# Patient Record
Sex: Male | Born: 1950 | Race: Black or African American | Hispanic: No | Marital: Married | Smoking: Former smoker
Health system: Southern US, Community
[De-identification: ages and names within clinical notes are randomized; demographics above are authoritative.]

## PROBLEM LIST (undated history)

## (undated) DIAGNOSIS — K2270 Barrett's esophagus without dysplasia: Secondary | ICD-10-CM

## (undated) DIAGNOSIS — R739 Hyperglycemia, unspecified: Secondary | ICD-10-CM

## (undated) DIAGNOSIS — N281 Cyst of kidney, acquired: Secondary | ICD-10-CM

## (undated) DIAGNOSIS — F32A Depression, unspecified: Secondary | ICD-10-CM

## (undated) DIAGNOSIS — K3184 Gastroparesis: Secondary | ICD-10-CM

## (undated) DIAGNOSIS — I10 Essential (primary) hypertension: Secondary | ICD-10-CM

## (undated) DIAGNOSIS — R51 Headache: Secondary | ICD-10-CM

## (undated) DIAGNOSIS — I639 Cerebral infarction, unspecified: Secondary | ICD-10-CM

## (undated) DIAGNOSIS — G8929 Other chronic pain: Secondary | ICD-10-CM

## (undated) DIAGNOSIS — J189 Pneumonia, unspecified organism: Secondary | ICD-10-CM

## (undated) DIAGNOSIS — K222 Esophageal obstruction: Secondary | ICD-10-CM

## (undated) DIAGNOSIS — R519 Headache, unspecified: Secondary | ICD-10-CM

## (undated) DIAGNOSIS — H539 Unspecified visual disturbance: Secondary | ICD-10-CM

## (undated) DIAGNOSIS — K2970 Gastritis, unspecified, without bleeding: Secondary | ICD-10-CM

## (undated) DIAGNOSIS — M549 Dorsalgia, unspecified: Secondary | ICD-10-CM

## (undated) DIAGNOSIS — K219 Gastro-esophageal reflux disease without esophagitis: Secondary | ICD-10-CM

## (undated) DIAGNOSIS — Z87442 Personal history of urinary calculi: Secondary | ICD-10-CM

## (undated) DIAGNOSIS — G4730 Sleep apnea, unspecified: Secondary | ICD-10-CM

## (undated) DIAGNOSIS — N200 Calculus of kidney: Secondary | ICD-10-CM

## (undated) HISTORY — DX: Dorsalgia, unspecified: M54.9

## (undated) HISTORY — DX: Esophageal obstruction: K22.2

## (undated) HISTORY — DX: Gastro-esophageal reflux disease without esophagitis: K21.9

## (undated) HISTORY — PX: COLONOSCOPY: SHX174

## (undated) HISTORY — DX: Sleep apnea, unspecified: G47.30

## (undated) HISTORY — PX: LUMBAR DISC SURGERY: SHX700

## (undated) HISTORY — DX: Cerebral infarction, unspecified: I63.9

## (undated) HISTORY — DX: Headache: R51

## (undated) HISTORY — DX: Gastritis, unspecified, without bleeding: K29.70

## (undated) HISTORY — DX: Calculus of kidney: N20.0

## (undated) HISTORY — DX: Headache, unspecified: R51.9

## (undated) HISTORY — PX: UPPER GASTROINTESTINAL ENDOSCOPY: SHX188

## (undated) HISTORY — DX: Gastroparesis: K31.84

## (undated) HISTORY — DX: Hyperglycemia, unspecified: R73.9

## (undated) HISTORY — DX: Barrett's esophagus without dysplasia: K22.70

## (undated) HISTORY — DX: Other chronic pain: G89.29

## (undated) HISTORY — DX: Unspecified visual disturbance: H53.9

## (undated) HISTORY — DX: Depression, unspecified: F32.A

## (undated) HISTORY — DX: Cyst of kidney, acquired: N28.1

## (undated) HISTORY — DX: Essential (primary) hypertension: I10

---

## 1999-06-21 ENCOUNTER — Encounter: Payer: Self-pay | Admitting: Emergency Medicine

## 1999-06-21 ENCOUNTER — Inpatient Hospital Stay (HOSPITAL_COMMUNITY): Admission: EM | Admit: 1999-06-21 | Discharge: 1999-06-23 | Payer: Self-pay | Admitting: Emergency Medicine

## 1999-06-22 ENCOUNTER — Encounter: Payer: Self-pay | Admitting: Internal Medicine

## 1999-06-23 ENCOUNTER — Encounter: Payer: Self-pay | Admitting: Internal Medicine

## 2000-09-26 ENCOUNTER — Encounter: Payer: Self-pay | Admitting: Neurosurgery

## 2000-09-26 ENCOUNTER — Ambulatory Visit (HOSPITAL_COMMUNITY): Admission: RE | Admit: 2000-09-26 | Discharge: 2000-09-26 | Payer: Self-pay | Admitting: Neurosurgery

## 2000-10-10 ENCOUNTER — Ambulatory Visit (HOSPITAL_COMMUNITY): Admission: RE | Admit: 2000-10-10 | Discharge: 2000-10-10 | Payer: Self-pay | Admitting: Neurosurgery

## 2000-10-10 ENCOUNTER — Encounter: Payer: Self-pay | Admitting: Neurosurgery

## 2002-09-05 ENCOUNTER — Inpatient Hospital Stay (HOSPITAL_COMMUNITY): Admission: EM | Admit: 2002-09-05 | Discharge: 2002-09-06 | Payer: Self-pay | Admitting: Emergency Medicine

## 2002-09-05 ENCOUNTER — Encounter: Payer: Self-pay | Admitting: Emergency Medicine

## 2003-09-04 ENCOUNTER — Ambulatory Visit (HOSPITAL_COMMUNITY): Admission: RE | Admit: 2003-09-04 | Discharge: 2003-09-04 | Payer: Self-pay | Admitting: Gastroenterology

## 2005-10-01 ENCOUNTER — Ambulatory Visit: Payer: Self-pay | Admitting: Gastroenterology

## 2005-10-13 ENCOUNTER — Encounter (INDEPENDENT_AMBULATORY_CARE_PROVIDER_SITE_OTHER): Payer: Self-pay | Admitting: Specialist

## 2005-10-13 ENCOUNTER — Ambulatory Visit: Payer: Self-pay | Admitting: Gastroenterology

## 2005-11-03 ENCOUNTER — Ambulatory Visit: Payer: Self-pay | Admitting: Gastroenterology

## 2006-01-07 ENCOUNTER — Encounter
Admission: RE | Admit: 2006-01-07 | Discharge: 2006-01-07 | Payer: Self-pay | Admitting: Physical Medicine and Rehabilitation

## 2006-11-01 ENCOUNTER — Ambulatory Visit: Payer: Self-pay | Admitting: Internal Medicine

## 2006-11-01 LAB — CONVERTED CEMR LAB
Albumin: 4 g/dL (ref 3.5–5.2)
BUN: 6 mg/dL (ref 6–23)
Basophils Absolute: 0.1 10*3/uL (ref 0.0–0.1)
CO2: 26 meq/L (ref 19–32)
Calcium: 9.1 mg/dL (ref 8.4–10.5)
Chol/HDL Ratio, serum: 5
Cholesterol: 155 mg/dL (ref 0–200)
Creatinine, Ser: 1.2 mg/dL (ref 0.4–1.5)
Hemoglobin: 15.5 g/dL (ref 13.0–17.0)
Ketones, ur: NEGATIVE mg/dL
LDL Cholesterol: 100 mg/dL — ABNORMAL HIGH (ref 0–99)
Leukocytes, UA: NEGATIVE
Lymphocytes Relative: 24.1 % (ref 12.0–46.0)
MCHC: 34.1 g/dL (ref 30.0–36.0)
MCV: 95.3 fL (ref 78.0–100.0)
Monocytes Relative: 5.5 % (ref 3.0–11.0)
Mucus, UA: NEGATIVE
Neutro Abs: 6.9 10*3/uL (ref 1.4–7.7)
Neutrophils Relative %: 68.5 % (ref 43.0–77.0)
TSH: 1.05 microintl units/mL (ref 0.35–5.50)
Urine Glucose: NEGATIVE mg/dL
Urobilinogen, UA: 0.2 (ref 0.0–1.0)
VLDL: 24 mg/dL (ref 0–40)
WBC number, urine, microscopy: NONE SEEN /hpf

## 2006-11-03 ENCOUNTER — Ambulatory Visit: Payer: Self-pay | Admitting: Internal Medicine

## 2007-01-02 ENCOUNTER — Ambulatory Visit: Payer: Self-pay | Admitting: Gastroenterology

## 2007-06-21 ENCOUNTER — Encounter: Admission: RE | Admit: 2007-06-21 | Discharge: 2007-06-21 | Payer: Self-pay | Admitting: Internal Medicine

## 2007-06-21 ENCOUNTER — Ambulatory Visit: Payer: Self-pay | Admitting: Internal Medicine

## 2007-06-21 LAB — CONVERTED CEMR LAB
Amylase: 91 units/L (ref 27–131)
BUN: 11 mg/dL (ref 6–23)
Bilirubin Urine: NEGATIVE
Bilirubin, Direct: 0.1 mg/dL (ref 0.0–0.3)
CO2: 28 meq/L (ref 19–32)
Creatinine, Ser: 1.1 mg/dL (ref 0.4–1.5)
Eosinophils Absolute: 0.1 10*3/uL (ref 0.0–0.6)
Eosinophils Relative: 0.9 % (ref 0.0–5.0)
GFR calc Af Amer: 90 mL/min
Glucose, Bld: 93 mg/dL (ref 70–99)
Ketones, ur: NEGATIVE mg/dL
Leukocytes, UA: NEGATIVE
Lipase: 23 units/L (ref 11.0–59.0)
Lymphocytes Relative: 27.7 % (ref 12.0–46.0)
MCV: 91.2 fL (ref 78.0–100.0)
Monocytes Absolute: 0.4 10*3/uL (ref 0.2–0.7)
Monocytes Relative: 4 % (ref 3.0–11.0)
Mucus, UA: NEGATIVE
Neutro Abs: 5.9 10*3/uL (ref 1.4–7.7)
Nitrite: NEGATIVE
Platelets: 218 10*3/uL (ref 150–400)
Potassium: 4.2 meq/L (ref 3.5–5.1)
Sodium: 140 meq/L (ref 135–145)
Total Bilirubin: 0.8 mg/dL (ref 0.3–1.2)
Total Protein: 7.8 g/dL (ref 6.0–8.3)
Urine Glucose: NEGATIVE mg/dL
Urobilinogen, UA: 0.2 (ref 0.0–1.0)

## 2007-06-23 ENCOUNTER — Encounter: Admission: RE | Admit: 2007-06-23 | Discharge: 2007-06-23 | Payer: Self-pay | Admitting: Internal Medicine

## 2007-07-04 ENCOUNTER — Ambulatory Visit: Payer: Self-pay | Admitting: Internal Medicine

## 2008-01-10 ENCOUNTER — Telehealth: Payer: Self-pay | Admitting: Internal Medicine

## 2008-01-10 DIAGNOSIS — J984 Other disorders of lung: Secondary | ICD-10-CM | POA: Insufficient documentation

## 2008-08-08 ENCOUNTER — Ambulatory Visit: Payer: Self-pay | Admitting: Internal Medicine

## 2008-08-08 DIAGNOSIS — F17200 Nicotine dependence, unspecified, uncomplicated: Secondary | ICD-10-CM | POA: Insufficient documentation

## 2008-08-08 DIAGNOSIS — R112 Nausea with vomiting, unspecified: Secondary | ICD-10-CM | POA: Insufficient documentation

## 2008-08-08 DIAGNOSIS — Z87448 Personal history of other diseases of urinary system: Secondary | ICD-10-CM | POA: Insufficient documentation

## 2008-08-09 ENCOUNTER — Telehealth: Payer: Self-pay | Admitting: Gastroenterology

## 2008-08-14 ENCOUNTER — Ambulatory Visit (HOSPITAL_BASED_OUTPATIENT_CLINIC_OR_DEPARTMENT_OTHER): Admission: RE | Admit: 2008-08-14 | Discharge: 2008-08-14 | Payer: Self-pay | Admitting: Internal Medicine

## 2008-08-14 ENCOUNTER — Telehealth: Payer: Self-pay | Admitting: Internal Medicine

## 2008-08-14 DIAGNOSIS — N281 Cyst of kidney, acquired: Secondary | ICD-10-CM | POA: Insufficient documentation

## 2008-08-15 ENCOUNTER — Telehealth: Payer: Self-pay | Admitting: Internal Medicine

## 2008-08-15 DIAGNOSIS — K209 Esophagitis, unspecified without bleeding: Secondary | ICD-10-CM | POA: Insufficient documentation

## 2008-08-15 DIAGNOSIS — K2900 Acute gastritis without bleeding: Secondary | ICD-10-CM | POA: Insufficient documentation

## 2008-08-15 DIAGNOSIS — K222 Esophageal obstruction: Secondary | ICD-10-CM | POA: Insufficient documentation

## 2008-08-16 ENCOUNTER — Ambulatory Visit: Payer: Self-pay | Admitting: Gastroenterology

## 2008-08-20 ENCOUNTER — Encounter: Payer: Self-pay | Admitting: Gastroenterology

## 2008-08-20 ENCOUNTER — Ambulatory Visit: Payer: Self-pay | Admitting: Gastroenterology

## 2008-08-20 ENCOUNTER — Encounter: Payer: Self-pay | Admitting: Internal Medicine

## 2008-08-26 ENCOUNTER — Encounter: Payer: Self-pay | Admitting: Gastroenterology

## 2008-10-03 ENCOUNTER — Ambulatory Visit: Payer: Self-pay | Admitting: Internal Medicine

## 2008-10-03 DIAGNOSIS — R0789 Other chest pain: Secondary | ICD-10-CM | POA: Insufficient documentation

## 2008-10-04 ENCOUNTER — Telehealth: Payer: Self-pay | Admitting: Internal Medicine

## 2008-10-04 ENCOUNTER — Ambulatory Visit: Payer: Self-pay | Admitting: Internal Medicine

## 2008-10-04 LAB — CONVERTED CEMR LAB
ALT: 29 units/L (ref 0–53)
AST: 22 units/L (ref 0–37)
Albumin: 3.9 g/dL (ref 3.5–5.2)
Alkaline Phosphatase: 74 units/L (ref 39–117)
BUN: 10 mg/dL (ref 6–23)
Bacteria, UA: NEGATIVE
Basophils Relative: 0.4 % (ref 0.0–3.0)
Chloride: 106 meq/L (ref 96–112)
Cholesterol: 158 mg/dL (ref 0–200)
Creatinine, Ser: 1.1 mg/dL (ref 0.4–1.5)
Eosinophils Absolute: 0.1 10*3/uL (ref 0.0–0.7)
Eosinophils Relative: 1 % (ref 0.0–5.0)
Glucose, Bld: 101 mg/dL — ABNORMAL HIGH (ref 70–99)
HCT: 44.2 % (ref 39.0–52.0)
MCV: 95.5 fL (ref 78.0–100.0)
Monocytes Absolute: 0.8 10*3/uL (ref 0.1–1.0)
Monocytes Relative: 8.2 % (ref 3.0–12.0)
Neutrophils Relative %: 66.1 % (ref 43.0–77.0)
Nitrite: NEGATIVE
PSA: 0.33 ng/mL (ref 0.10–4.00)
Platelets: 188 10*3/uL (ref 150–400)
Potassium: 4.3 meq/L (ref 3.5–5.1)
RBC: 4.63 M/uL (ref 4.22–5.81)
Specific Gravity, Urine: 1.025 (ref 1.000–1.03)
Total Protein, Urine: NEGATIVE mg/dL
Total Protein: 6.9 g/dL (ref 6.0–8.3)
Urine Glucose: NEGATIVE mg/dL
WBC: 10.1 10*3/uL (ref 4.5–10.5)

## 2008-10-08 ENCOUNTER — Ambulatory Visit: Payer: Self-pay | Admitting: Internal Medicine

## 2008-10-08 DIAGNOSIS — K219 Gastro-esophageal reflux disease without esophagitis: Secondary | ICD-10-CM | POA: Insufficient documentation

## 2008-10-15 ENCOUNTER — Telehealth: Payer: Self-pay | Admitting: Internal Medicine

## 2008-10-15 ENCOUNTER — Encounter: Admission: RE | Admit: 2008-10-15 | Discharge: 2008-10-15 | Payer: Self-pay | Admitting: Internal Medicine

## 2008-10-16 ENCOUNTER — Ambulatory Visit: Payer: Self-pay | Admitting: Internal Medicine

## 2008-10-16 ENCOUNTER — Ambulatory Visit: Payer: Self-pay | Admitting: Cardiology

## 2008-10-16 ENCOUNTER — Encounter: Payer: Self-pay | Admitting: Internal Medicine

## 2008-10-16 ENCOUNTER — Inpatient Hospital Stay (HOSPITAL_COMMUNITY): Admission: AD | Admit: 2008-10-16 | Discharge: 2008-10-20 | Payer: Self-pay | Admitting: Internal Medicine

## 2008-10-17 ENCOUNTER — Ambulatory Visit: Payer: Self-pay | Admitting: Internal Medicine

## 2008-10-21 ENCOUNTER — Telehealth: Payer: Self-pay | Admitting: Internal Medicine

## 2008-10-21 DIAGNOSIS — IMO0002 Reserved for concepts with insufficient information to code with codable children: Secondary | ICD-10-CM | POA: Insufficient documentation

## 2008-11-11 ENCOUNTER — Ambulatory Visit (HOSPITAL_COMMUNITY): Admission: RE | Admit: 2008-11-11 | Discharge: 2008-11-12 | Payer: Self-pay | Admitting: Neurosurgery

## 2008-11-13 ENCOUNTER — Telehealth (INDEPENDENT_AMBULATORY_CARE_PROVIDER_SITE_OTHER): Payer: Self-pay | Admitting: *Deleted

## 2009-02-05 ENCOUNTER — Encounter: Payer: Self-pay | Admitting: Gastroenterology

## 2009-02-05 ENCOUNTER — Telehealth: Payer: Self-pay | Admitting: Gastroenterology

## 2009-02-06 ENCOUNTER — Telehealth: Payer: Self-pay | Admitting: Gastroenterology

## 2009-02-19 ENCOUNTER — Ambulatory Visit (HOSPITAL_COMMUNITY): Admission: RE | Admit: 2009-02-19 | Discharge: 2009-02-19 | Payer: Self-pay | Admitting: Gastroenterology

## 2009-02-20 ENCOUNTER — Ambulatory Visit: Payer: Self-pay | Admitting: Gastroenterology

## 2009-02-20 DIAGNOSIS — K3184 Gastroparesis: Secondary | ICD-10-CM | POA: Insufficient documentation

## 2009-02-20 DIAGNOSIS — K2270 Barrett's esophagus without dysplasia: Secondary | ICD-10-CM | POA: Insufficient documentation

## 2009-04-21 ENCOUNTER — Telehealth: Payer: Self-pay | Admitting: Family Medicine

## 2009-04-21 ENCOUNTER — Ambulatory Visit: Payer: Self-pay | Admitting: Internal Medicine

## 2009-04-21 LAB — CONVERTED CEMR LAB
Basophils Absolute: 0.1 10*3/uL (ref 0.0–0.1)
HCT: 46 % (ref 39.0–52.0)
Hemoglobin: 15.9 g/dL (ref 13.0–17.0)
Lymphs Abs: 2.3 10*3/uL (ref 0.7–4.0)
MCHC: 34.6 g/dL (ref 30.0–36.0)
MCV: 94.2 fL (ref 78.0–100.0)
Monocytes Absolute: 0.8 10*3/uL (ref 0.1–1.0)
Monocytes Relative: 8.1 % (ref 3.0–12.0)
Neutro Abs: 6.7 10*3/uL (ref 1.4–7.7)
PSA: 0.42 ng/mL (ref 0.10–4.00)
Platelets: 182 10*3/uL (ref 150.0–400.0)
RDW: 12.3 % (ref 11.5–14.6)

## 2009-04-24 ENCOUNTER — Emergency Department (HOSPITAL_COMMUNITY): Admission: EM | Admit: 2009-04-24 | Discharge: 2009-04-24 | Payer: Self-pay | Admitting: Emergency Medicine

## 2009-04-24 ENCOUNTER — Telehealth: Payer: Self-pay | Admitting: Internal Medicine

## 2009-04-25 ENCOUNTER — Ambulatory Visit (HOSPITAL_COMMUNITY): Admission: AD | Admit: 2009-04-25 | Discharge: 2009-04-25 | Payer: Self-pay | Admitting: Dermatology

## 2009-05-23 ENCOUNTER — Ambulatory Visit: Payer: Self-pay | Admitting: Internal Medicine

## 2009-05-23 DIAGNOSIS — N200 Calculus of kidney: Secondary | ICD-10-CM | POA: Insufficient documentation

## 2009-07-02 ENCOUNTER — Ambulatory Visit: Payer: Self-pay | Admitting: Physical Medicine and Rehabilitation

## 2009-07-03 ENCOUNTER — Encounter: Payer: Self-pay | Admitting: Internal Medicine

## 2009-08-15 ENCOUNTER — Telehealth: Payer: Self-pay | Admitting: Gastroenterology

## 2009-08-18 ENCOUNTER — Encounter: Payer: Self-pay | Admitting: Gastroenterology

## 2009-11-03 ENCOUNTER — Telehealth: Payer: Self-pay | Admitting: Internal Medicine

## 2009-11-25 ENCOUNTER — Encounter (INDEPENDENT_AMBULATORY_CARE_PROVIDER_SITE_OTHER): Payer: Self-pay | Admitting: *Deleted

## 2009-12-01 ENCOUNTER — Ambulatory Visit: Payer: Self-pay | Admitting: Gastroenterology

## 2009-12-09 ENCOUNTER — Ambulatory Visit: Payer: Self-pay | Admitting: Gastroenterology

## 2009-12-11 ENCOUNTER — Encounter: Payer: Self-pay | Admitting: Gastroenterology

## 2010-09-09 ENCOUNTER — Emergency Department (HOSPITAL_COMMUNITY): Admission: EM | Admit: 2010-09-09 | Discharge: 2010-09-09 | Payer: Self-pay | Admitting: Emergency Medicine

## 2010-10-02 ENCOUNTER — Encounter: Payer: Self-pay | Admitting: Internal Medicine

## 2010-10-02 ENCOUNTER — Ambulatory Visit: Payer: Self-pay | Admitting: Internal Medicine

## 2010-10-02 DIAGNOSIS — R7309 Other abnormal glucose: Secondary | ICD-10-CM | POA: Insufficient documentation

## 2010-11-13 ENCOUNTER — Telehealth: Payer: Self-pay | Admitting: Gastroenterology

## 2010-11-15 HISTORY — PX: CERVICAL DISC SURGERY: SHX588

## 2010-12-06 ENCOUNTER — Encounter: Payer: Self-pay | Admitting: Internal Medicine

## 2010-12-15 NOTE — Procedures (Signed)
Summary: Upper Endoscopy  Patient: Richard Vaughn Note: All result statuses are Final unless otherwise noted.  Tests: (1) Upper Endoscopy (EGD)   EGD Upper Endoscopy       DONE     [facility]     [address]     [place], [place]  [address]           ENDOSCOPY PROCEDURE REPORT           PATIENT:  Richard Vaughn, Richard Vaughn  MR#:  [id number]     BIRTHDATE:  [birth year], 58 yrs. old  GENDER:  male           ENDOSCOPIST:  Barbette Hair. Arlyce Dice, MD     Referred by:           PROCEDURE DATE:  12/09/2009     PROCEDURE:  EGD with biopsy     ASA CLASS:  Class II     INDICATIONS:  h/o Barrett's Esophagus           MEDICATIONS:   Fentanyl 75 mcg IV, Versed 7 mg IV, glycopyrrolate     (Robinal) 0.2 mg IV, 0.6cc simethancone 0.6 cc PO     TOPICAL ANESTHETIC:  Exactacain Spray           DESCRIPTION OF PROCEDURE:   After the risks benefits and     alternatives of the procedure were thoroughly explained, informed     consent was obtained.  The LB GIF-H180 [id number] endoscope was     introduced through the mouth and advanced to the third portion of     the duodenum, without limitations.  The instrument was slowly     withdrawn as the mucosa was fully examined.     <<PROCEDUREIMAGES>>           Barrett's esophagus was found at the gastroesophageal junction.     Short segment Barrett's. Multiple bxs taken (see image1).  Mild     gastritis was found in the fundus (see image2 and image3). Erosive     changes  Otherwise the examination was normal.    Retroflexed     views revealed no abnormalities.    The scope was then withdrawn     from the patient and the procedure completed.           COMPLICATIONS:  None           ENDOSCOPIC IMPRESSION:     1) Barrett's esophagus at the gastroesophageal junction     2) Mild gastritis in the fundus     3) Otherwise normal examination     RECOMMENDATIONS:     1) continue current medications     2) await biopsy results           REPEAT EXAM:   You will receive  a letter from Dr. Arlyce Dice in 1-2     weeks, after reviewing the final pathology, with followup     recommendations.           ______________________________     Barbette Hair Arlyce Dice, MD           CC:  Thomos Lemons, DO           n.     eSIGNED:   Barbette Hair. Kaplan at 12/09/2009 10:01 AM           Edmund Hilda, [id number]  Note: An exclamation mark (!) indicates a result that was not dispersed into the flowsheet. Document Creation Date: 12/09/2009 10:01 AM _______________________________________________________________________  (  1) Order result status: Final Collection or observation date-time: 12/09/2009 09:54 Requested date-time:  Receipt date-time:  Reported date-time:  Referring Physician:   Ordering Physician: Melvia Heaps [phone]) Specimen Source:  Source: Launa Grill Order Number: [id number] Lab site:   Appended Document: Upper Endoscopy     Procedures Next Due Date:    EGD: 11/2012

## 2010-12-15 NOTE — Miscellaneous (Signed)
Summary: [facility] Previsit/prep  Clinical Lists Changes  Observations: Added new observation of NKA: T (12/01/2009 11:00)

## 2010-12-15 NOTE — Assessment & Plan Note (Signed)
Summary: CPX/DT   Vital Signs:  Patient profile:   60 year old male Height:      74 inches Weight:      286 pounds BMI:     36.85 O2 Sat:      98 % on Room air Temp:     98.2 degrees F oral Pulse rate:   80 / minute BP sitting:   120 / 84  (right arm) Cuff size:   large  Vitals Entered By: Payton Spark CMA (October 02, 2010 11:36 AM)  O2 Flow:  Room air CC: CPX   Primary Care Provider:  Dondra Spry DO  CC:  CPX.  History of Present Illness: 60 y/o male with hx of chronic pain for routine cpx  less GI issues since changing to new pain med no recurrence of kidney stone still disabled mild depressive symptoms  still smoking buproprion helped in the past  Preventive Screening-Counseling & Management  Alcohol-Tobacco     Smoking Status: current     Smoking Cessation Counseling: yes     Smoke Cessation Stage: ready     Packs/Day: 0.5  Current Medications (verified): 1)  Nexium 40 Mg  Cpdr (Esomeprazole Magnesium) .Marland Kitchen.. 1 Capsule Each Day 30 Minutes Before Meal 2)  Opana 10 Mg Tabs (Oxymorphone Hcl) .... Take 1 Tab By Mouth Every 6 Hours  Allergies (verified): No Known Drug Allergies  Past History:  Past Medical History: GERD Hx of Atypical Chest Pain - neg stress test 2003 History of chronic LBP     S/P partial diskectomy in 1995 and disc replacement in 12/2003     Poor response to narcotics - but tolerates tramadol Tobacco abuse  Depression/Anxiety    Barrett's esophagus Hx of esophageal stricture S/P dilation 10/09 - Dr Arlyce Dice Delayed gastric empyting   Kidney stones - calcium oxalate  Family History: Liver cancer - Mother Stroke - father  Colon ca - no Prostate ca - no Hx of hypertension in family       Social History: 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    Physical Exam  General:  alert, well-developed, and well-nourished.   Head:  normocephalic and atraumatic.   Ears:  R ear normal and L ear normal.   Mouth:  pharynx pink and moist and poor dentition.   Neck:  supple, no masses, and no carotid bruits.   Lungs:  normal respiratory effort and normal breath sounds.   Heart:  normal rate, regular rhythm, and no gallop.   Abdomen:  soft, non-tender, normal bowel sounds, no masses, no hepatomegaly, and no splenomegaly.   Extremities:  No lower extremity edema  Neurologic:  cranial nerves II-XII intact and gait normal.   Psych:  normally interactive, good eye contact, not anxious appearing, and not depressed appearing.     Impression & Recommendations:  Problem # 1:  PHYSICAL EXAMINATION (ICD-V70.0) Reviewed adult health maintenance protocols. Pt counseled on diet and exercise.  Orders: T-Basic Metabolic Panel [phone]) T-Lipid Profile [id number])  Colonoscopy: normal (08/07/2003) Flu Vax: Declined (08/08/2008)   Pneumovax: Declined (08/08/2008) Chol: 158 (10/04/2008)   HDL: 30.7 (10/04/2008)   LDL: 110 (10/04/2008)   TG: 88 (10/04/2008) TSH: 0.93 (10/04/2008)   PSA: 0.42 (  04/21/2009)  Problem # 2:  TOBACCO ABUSE (ICD-305.1)  Encouraged smoking cessation and discussed different methods for smoking cessation.   Complete Medication List: 1)  Nexium 40 Mg Cpdr (Esomeprazole magnesium) .Marland Kitchen.. 1 capsule each day 30 minutes before meal 2)  Opana 10 Mg Tabs (Oxymorphone hcl) .... Take 1 tab by mouth every 6 hours 3)  Bupropion Hcl 150 Mg Xr24h-tab (Bupropion hcl) .... One by mouth once daily  Other Orders: T- Hemoglobin A1C ([id number]) CRP, high sensitivity-[facility] ([id number])  Patient Instructions: 1)  Please schedule a follow-up appointment in 1 year. Prescriptions: BUPROPION HCL 150 MG XR24H-TAB (BUPROPION HCL) one by  mouth once daily  #30 x 3   Entered and Authorized by:   D. Thomos Lemons DO   Signed by:   D. Thomos Lemons DO on 10/02/2010   Method used:   [facility] to        [facility]  [phone]* (retail)       [address] [place], [place]  [address]       Ph: [phone] or [phone]       Fax: [phone]   RxID:   [id number]    Orders Added: 1)  T-Basic Metabolic Panel [id number] 2)  T-Lipid Profile [[address]-22930] 3)  T- Hemoglobin A1C [[address]-23375] 4)  CRP, high sensitivity-[facility] [[address]] 5)  Est. Patient 40-64 years [[address]]

## 2010-12-15 NOTE — Letter (Signed)
Summary: Patient Notice-Barrett's [facility]  [address] [place], [place] [address]   Phone: [phone]  Fax: [phone]        December 11, 2009 MRN: [id number]    Richard Vaughn [address] [place], [place]  [address]    Dear Mr. [name],  I am pleased to inform you that the biopsies taken during your recent endoscopic examination did not show any evidence of cancer upon pathologic examination.  However, your biopsies indicate you have a condition known as Barrett's esophagus. While not cancer, it is pre-cancerous (can progress to cancer) and needs to be monitored with repeat endoscopic examination and biopsies.  Fortunately, it is quite rare that this develops into cancer, but careful monitoring of the condition along with taking your medication as prescribed is important in reducing the risk of developing cancer.  It is my recommendation that you have a repeat upper gastrointestinal endoscopic examination in 3_ years.  Additional information/recommendations:  __Please call [phone] to schedule a return visit to further      evaluate your condition.  _x_Continue with treatment plan as outlined the day of your exam.  Please call us if you have or develop heartburn, reflux symptoms, any swallowing problems, or if you have questions about your condition that have not been fully answered at this time.  Sincerely,  Louis Meckel MD  This letter has been electronically signed by your physician.  Appended Document: Patient Notice-Barrett's Esopghagus Letter mailed 1.31.11

## 2010-12-15 NOTE — Letter (Signed)
Summary: EGD Instructions  [facility]  [address] [place], [place] [address]   Phone: [phone]  Fax: [phone]       Richard Vaughn    [birth year]    MRN: [id number]       Procedure Day Dorna Bloom: Rica Mote. 01/25     Arrival Time:  8:30am     Procedure Time:  9:30am    Location of Procedure:                    _ X _ [facility] (4th Floor)    PREPARATION FOR ENDOSCOPY   On  Tues. 12/09/09   THE DAY OF THE PROCEDURE:  1.   No solid foods, milk or milk products are allowed after midnight the night before your procedure.  2.   Do not drink anything colored red or purple.  Avoid juices with pulp.  No orange juice.  3.  You may drink clear liquids until 7:30am , which is 2 hours before your procedure.                                                                                                CLEAR LIQUIDS INCLUDE: Water Jello Ice Popsicles Tea (sugar ok, no milk/cream) Powdered fruit flavored drinks Coffee (sugar ok, no milk/cream) Gatorade Juice: apple, white grape, white cranberry  Lemonade Clear bullion, consomm, broth Carbonated beverages (any kind) Strained chicken noodle soup Hard Candy   MEDICATION INSTRUCTIONS  Unless otherwise instructed, you should take regular prescription medications with a small sip of water as early as possible the morning of your procedure.        OTHER INSTRUCTIONS  You will need a responsible adult at least 58 years of age to accompany you and drive you home.   This person must remain in the waiting room during your procedure.  Wear loose fitting clothing that is easily removed.  Leave jewelry and other valuables at home.  However, you may wish to bring a book to read or an iPod/MP3 player to listen to music as you wait for your procedure to start.  Remove all body piercing jewelry and leave at home.  Total time from sign-in until discharge is approximately 2-3 hours.  You should go home directly  after your procedure and rest.  You can resume normal activities the day after your procedure.  The day of your procedure you should not:   Drive   Make legal decisions   Operate machinery   Drink alcohol   Return to work  You will receive specific instructions about eating, activities and medications before you leave.    The above instructions have been reviewed and explained to me by   Wyona Almas RN  December 01, 2009 11:28 AM     I fully understand and can verbalize these instructions _____________________________ Date _________

## 2010-12-17 NOTE — Progress Notes (Signed)
Summary: Med Refill  Medications Added NEXIUM 40 MG  CPDR (ESOMEPRAZOLE MAGNESIUM) 1 capsule each day 30 minutes before meal       Phone Note Call from Patient Call back at Home Phone [phone]   Caller: Patient Call For: Dr Arlyce Dice Reason for Call: Refill Medication Details for Reason: Med Refill Summary of Call: Pt req a 30 refill on his Nexium until his appointment on 12/21/10 Initial call taken by: Dwan Bolt,  November 13, 2010 3:13 PM  Follow-up for Phone Call        Will sent in an extra 30 due to his appointment not until 2/6 Follow-up by: Merri Ray CMA [name]),  November 13, 2010 3:28 PM    New/Updated Medications: NEXIUM 40 MG  CPDR (ESOMEPRAZOLE MAGNESIUM) 1 capsule each day 30 minutes before meal Prescriptions: NEXIUM 40 MG  CPDR (ESOMEPRAZOLE MAGNESIUM) 1 capsule each day 30 minutes before meal  #30 x 0   Entered by:   Merri Ray CMA (AAMA)   Authorized by:   Louis Meckel MD   Signed by:   Merri Ray CMA (AAMA) on 11/13/2010   Method used:   [facility]  [phone]* (retail)       [address] [place], [place]  [address]       Ph: [phone] or [phone]       Fax: [phone]   RxID:   [id number]

## 2010-12-18 ENCOUNTER — Encounter: Payer: Self-pay | Admitting: Internal Medicine

## 2010-12-21 ENCOUNTER — Ambulatory Visit (INDEPENDENT_AMBULATORY_CARE_PROVIDER_SITE_OTHER): Payer: Federal, State, Local not specified - PPO | Admitting: Gastroenterology

## 2010-12-21 ENCOUNTER — Encounter: Payer: Self-pay | Admitting: Gastroenterology

## 2010-12-21 DIAGNOSIS — K2270 Barrett's esophagus without dysplasia: Secondary | ICD-10-CM

## 2010-12-21 DIAGNOSIS — K219 Gastro-esophageal reflux disease without esophagitis: Secondary | ICD-10-CM

## 2010-12-31 NOTE — Assessment & Plan Note (Signed)
Summary: Med Refill     History of Present Illness Visit Type: Follow-up Visit Primary GI MD: Melvia Heaps MD [facility] Primary Provider: Thomos Lemons, DO Requesting Provider: na Chief Complaint: F/u for barrett's esophagus and GERD. Pt states that he is doing good and denies any GI complaints. Pt needs refill on Nexium  History of Present Illness:   Richard Vaughn has returned for followup of his Barrett's esophagus and esophageal stricture.  Last Egd in 2011 was negative for dysplasia.  He denies dysphagia.  He remains on Nexium.   GI Review of Systems      Denies abdominal pain, acid reflux, belching, bloating, chest pain, dysphagia with liquids, dysphagia with solids, heartburn, loss of appetite, nausea, vomiting, vomiting blood, weight loss, and  weight gain.        Denies anal fissure, black tarry stools, change in bowel habit, constipation, diarrhea, diverticulosis, fecal incontinence, heme positive stool, hemorrhoids, irritable bowel syndrome, jaundice, light color stool, liver problems, rectal bleeding, and  rectal pain.    Current Medications (verified): 1)  Nexium 40 Mg  Cpdr (Esomeprazole Magnesium) .Marland Kitchen.. 1 Capsule Each Day 30 Minutes Before Meal 2)  Opana 10 Mg Tabs (Oxymorphone Hcl) .... Take 1 Tab By Mouth Every 6 Hours  Allergies (verified): No Known Drug Allergies  Past History:  Past Medical History: Reviewed history from 10/02/2010 and no changes required. GERD Hx of Atypical Chest Pain - neg stress test 2003 History of chronic LBP     S/P partial diskectomy in 1995 and disc replacement in 12/2003     Poor response to narcotics - but tolerates tramadol Tobacco abuse  Depression/Anxiety    Barrett's esophagus Hx of esophageal stricture S/P dilation 10/09 - Dr Arlyce Dice Delayed gastric empyting   Kidney stones - calcium oxalate  Past Surgical History: Reviewed history from 02/20/2009 and no changes required. T1 T2 Laminectomy  Family History: Reviewed history from  10/02/2010 and no changes required. Liver cancer - Mother Stroke - father  Colon ca - no Prostate ca - no Hx of hypertension in family       Social History: Reviewed history from 10/02/2010 and no changes required. 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  The patient denies allergy/sinus, anemia, anxiety-new, arthritis/joint pain, blood in urine, breast changes/lumps, change in vision, confusion, cough, coughing up blood, depression-new, fainting, fatigue, fever, headaches-new, hearing problems, heart murmur, heart rhythm changes, itching, menstrual pain, muscle pains/cramps, night sweats, nosebleeds, pregnancy symptoms, shortness of breath, skin rash, sleeping problems, sore throat, swelling of feet/legs, swollen lymph glands, thirst - excessive , urination - excessive , urination changes/pain, urine leakage, vision changes, and voice change.         All other systems were reviewed and were negative   Vital Signs:  Patient profile:   60 year old male Height:      74 inches Weight:      296 pounds BMI:     38.14 BSA:     2.57 Pulse rate:   88 / minute Pulse rhythm:   regular BP sitting:   132 / 74  (left arm) Cuff size:   large  Vitals Entered By: Ok Anis CMA (December 21, 2010 8:56 AM)  Physical Exam  Additional Exam:  On physical exam he is a well-developed well-nourished  male  skin: anicteric HEENT: normocephalic; PEERLA; no nasal or pharyngeal abnormalities neck: supple nodes: no cervical lymphadenopathy chest: clear to ausculatation and percussion heart: no murmurs, gallops, or rubs abd: soft, nontender; BS normoactive; no abdominal masses, tenderness, organomegaly rectal: deferred ext: no cynanosis, clubbing, edema skeletal: no deformities neuro: oriented x 3; no focal abnormalities    Impression &  Recommendations:  Problem # 1:  BARRETTS ESOPHAGUS (ICD-530.85) Plan followup endoscopy in 2014  Problem # 2:  GERD (ICD-530.81) Plan to continue Nexium  Problem # 3:  GASTROPARESIS (ICD-536.3) Currently asymptomatic off medications.  Patient Instructions: 1)  Copy sent to : Thomos Lemons, DO 2)  Your Next Endoscopy is due in 2014, We will mail out a reminder for you 3)  Call back as needed  4)  The medication list was reviewed and reconciled.  All changed / newly prescribed medications were explained.  A complete medication list was provided to the patient / caregiver. Prescriptions: NEXIUM 40 MG  CPDR (ESOMEPRAZOLE MAGNESIUM) 1 capsule each day 30 minutes before meal  #30 Capsule x 2   Entered and Authorized by:   Louis Meckel MD   Signed by:   Louis Meckel MD on 12/21/2010   Method used:   [facility] to        [facility]  [phone]* (retail)       [address] [place], [place]  [address]       Ph: [phone] or [phone]       Fax: [phone]   RxID:   [id number]

## 2011-01-01 ENCOUNTER — Encounter: Payer: Self-pay | Admitting: Internal Medicine

## 2011-01-06 NOTE — Letter (Signed)
Summary: [facility]  [facility]   Imported By: Maryln Gottron 12/31/2010 15:35:31  _____________________________________________________________________  External Attachment:    Type:   Image     Comment:   External Document

## 2011-01-21 NOTE — Letter (Signed)
Summary: [facility]  [facility]   Imported By: Maryln Gottron 01/13/2011 14:54:57  _____________________________________________________________________  External Attachment:    Type:   Image     Comment:   External Document

## 2011-02-22 LAB — URINE MICROSCOPIC-ADD ON

## 2011-02-22 LAB — DIFFERENTIAL
Basophils Absolute: 0 10*3/uL (ref 0.0–0.1)
Lymphocytes Relative: 15 % (ref 12–46)
Neutro Abs: 8.8 10*3/uL — ABNORMAL HIGH (ref 1.7–7.7)
Neutrophils Relative %: 77 % (ref 43–77)

## 2011-02-22 LAB — COMPREHENSIVE METABOLIC PANEL
BUN: 8 mg/dL (ref 6–23)
CO2: 22 mEq/L (ref 19–32)
Calcium: 8.2 mg/dL — ABNORMAL LOW (ref 8.4–10.5)
Chloride: 111 mEq/L (ref 96–112)
Creatinine, Ser: 0.99 mg/dL (ref 0.4–1.5)
GFR calc non Af Amer: >60 mL/min (ref >60)
Total Bilirubin: 0.6 mg/dL (ref 0.3–1.2)

## 2011-02-22 LAB — CBC
HCT: 42 % (ref 39.0–52.0)
MCHC: 34.8 g/dL (ref 30.0–36.0)
MCV: 94.1 fL (ref 78.0–100.0)
RBC: 4.47 MIL/uL (ref 4.22–5.81)
WBC: 11.3 10*3/uL — ABNORMAL HIGH (ref 4.0–10.5)

## 2011-02-22 LAB — URINALYSIS, ROUTINE W REFLEX MICROSCOPIC
Glucose, UA: NEGATIVE mg/dL
Specific Gravity, Urine: 1.014 (ref 1.005–1.030)
pH: 5.5 (ref 5.0–8.0)

## 2011-02-22 LAB — URINE CULTURE

## 2011-02-22 LAB — LIPASE, BLOOD: Lipase: 16 U/L (ref 11–59)

## 2011-03-29 ENCOUNTER — Other Ambulatory Visit: Payer: Self-pay | Admitting: Gastroenterology

## 2011-03-30 NOTE — Op Note (Signed)
NAME:  CONNERY, SHIFFLER NO.:  [id number]   MEDICAL RECORD NO.:  [id number]          PATIENT TYPE:  OIB   LOCATION:  3524                         FACILITY:  [facility]   PHYSICIAN:  Reinaldo Meeker, M.D. 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   PROCEDURE:  Left T1-T2 laminotomy with foraminotomy and excision of  herniated disk.   SURGEON:  Reinaldo Meeker, MD   ASSISTANT:  Donalee Citrin, MD   PROCEDURE IN DETAIL:  After placing in the prone position, the patient's  neck and upper chest was prepped and draped in the usual sterile  fashion.  Localizing x-ray was taken prior to incision to identify the  appropriate level.  A midline incision was made over the spinous  processes of the C7, T1, and T2.  Using Bovie cutting current, the  incision was carried down the spinous processes.  Subperiosteal  dissection was then carried out on the left-sided spinous process,  lamina, and facet joint.  Self-retaining retractor was placed for  exposure.  X-rays showed approach to the C6-7 and C7-T1 levels and  dissection was carried out 1 level further to the T1-T2 level.  Self-  retaining retractor was placed for exposure of that level.  Laminotomy  was performed by removing the inferior third of the T1 lamina laterally  and the superior one-third of the T2 lamina.  The facet joint was then  undercut to decompress the T2 nerve root as it went out the foramen.  Thorough decompression was carried out.  The microscope was draped,  brought in the field, and used for the remainder of the case.  Using  microdissection technique, the lateral recess __________ interval was  identified.  Large amounts of herniated disk material were identified  there and this was removed.  Exploration under  the nerve root then  yielded two large fragments of disk, which were also removed to give  excellent decompression of the lateral spinal dura and T2 nerve root.  At that time, inspection was carried out in all directions for any  evidence of residual compression and none could be identified.  Large  amounts of irrigation were carried out.  Any bleeding was controlled  with bipolar coagulation and Gelfoam.  The wound was then closed in  multiple layers of Vicryl in the muscle fascia, subcutaneous and  subcuticular tissues and staples were placed on the skin.  A sterile  dressing was then applied.  The patient was extubated and taken to the  recovery room in stable condition.           ______________________________  Reinaldo Meeker, M.D.     ROK/MEDQ  D:  11/11/2008  T:  11/11/2008  Job:  [id number]

## 2011-03-30 NOTE — Group Therapy Note (Signed)
[name] is a 60 year old married gentleman, who is kindly  referred by Dr. Artist Pais.  [name] has a long history of low back pain,  which dates back to 1995 when he underwent a diskectomy for his L5-S1  disk.  He states that he did well for almost 10 years, then in  approximately 2005, he developed some right leg pain, and then a year  and half later left leg pain.   He underwent disk replacement surgery down in [place].  He more  recently developed a disk herniation at T1-T2 in 2009 and underwent a  laminectomy at that level by Dr. Gerlene Fee.   [name] indicates that he has several areas of pain.  The worst one for  him is in his right lower extremity, which he describes as a very  constant pain, ranging from a 3-7or 8 on a scale of 10.  The pain is  constant throughout the day as well as a night and also makes it  somewhat difficult for him to sleep at times.  It limits his walking to  not more than 15 minutes.  He is able to climb stairs.  He does not need  a cane.   He also gets some left lower extremity pain, which occurs a couple of  times a month.  He states that it is sporadic and it goes from 5-10 on a  scale of 10 at times.   He has some mid thoracic back pain, which he describes as a 4-7 on a  scale of 10 and occurs almost every day, but it is not as  aggravating  as his right leg pain.  He has minimal low back pain at this time.   Pain is typically worse with walking, bending, sitting, inactivity, or  standing, it improves with rest.  He gets fair relief with current meds.   Pain medications at this time include Ultram 1 tablet usually about 7 or  8 p.m. and Percocet 1 tablet up to twice a day.  In the past, he is  taking his pain medications more regularly; however, over the last year,  he has developed nausea, which has gotten much worse to the point that  it interferes with his daily activities.  It is this nausea that is very  frustrating to him as he  cannot take pain medicines because the nausea  gets much worse.   Functional status, he has been disabled since December 24, 2002.  He  denies problems controlling bowel or bladder.  He denies depression,  anxiety, or suicidal ideation.   Review of systems is positive for nausea which is worse when he takes  his pain medications, especially the Percocet as well as the Ultram.  He  states that he has been on these pain medications at least 5 years, but  within the last year that he has had more problems.  He does have a GI  specialist, Dr. Arlyce Dice who also takes care of him.   Past medical history is significant for Barrett esophagus, tobacco use,  depression/anxiety, history of esophageal strictures, status post  dilation in October 2009 by Dr. Arlyce Dice, history of delayed gastric  emptying, and history of kidney stones.   No known drug allergies.   Past surgical history, in 1995; partial diskectomy L5-S1.  In 2005, disk  replacement in [place].  In 2009, T1-T2 laminectomy Dr. Gerlene Fee.   The patient has been married 12 years.  He lives with  his wife.  No  other people are in the home.  He denies illegal substance use.  Denies  alcohol use.  Smokes a pack of cigarettes per day.   Mother deceased at age 66, biliary cancer.  Father deceased at age 25,  stroke and vascular disease.   Other medications he takes include:  1. Nexium 40 mg.  2. Ranitidine 150 mg at bedtime.  3. Ondansetron 4 mg q.6 h. as needed for nausea.  4. Promethazine 25 mg q.6 h. as needed for nausea.   Examination, an MRI was done on October 18, 2008, showed multilevel  cervical degenerative changes at T1-T2 was moderate, large left-sided  disk protrusion, compressing left T2 nerve root with mass effect on the  left cord, status post laminectomy in 2009, Dr. Gerlene Fee, thoracic MRI  done same date showed large right-sided disk herniation with a vertebral  spurring and flattening of the cord on the right and  right foraminal  encroachment.   Apparently, there has been an MRI within the last couple of years done  through [facility] office.  I do not have results from this particular  scan.   Exam, blood pressure is 145/92, pulse 89, respiration 18, 97% saturated  on room air.  [name] is a well-developed, well-nourished gentleman,  who appears in his stated age and does not appear in any distress.  He  is oriented x3.  Speech is clear.  His affect is bright.  He is alert,  cooperative, and pleasant.  Follows commands without difficulty.  Answers my questions appropriately.   Cranial nerves and coordination are intact.  Reflexes are 2+ at the  right biceps; triceps; and brachial radialis, 2+ at the left biceps and  brachioradialis, 1+ at the left triceps, 1+ at the right patellar  tendon, 2+ at the left patellar tendon, 1+ at bilateral Achilles  tendons.  No abnormal tone or clonus or tremors are appreciated.  He has  normal muscle bulk noted in both upper as well as lower extremities  without obvious atrophy.   Motor strength generally is 5/5 without focal deficit in upper and lower  extremities.   Transitioning from sitting to standing is done with ease, heel-toe  walking is accomplished easily, tandem gait is performed adequately,  Romberg test is performed adequately as well.  Limitations noted was  cervical rotation to the left minimally with some discomfort, full  shoulder range of motion appreciated, lumbar motion is limited in all  planes with some complaints of pain in all directions.   Sensory deficit is appreciated in the left medial arm, sensory deficit  also noted over right medial ankle and right medial lower leg as well as  left medial ankle and plantar surface.   IMPRESSION:  1. Multilevel cervical degenerative changes.  2. Multilevel thoracic degenerative changes.  3. Status post disk replacement at L5-S1 in 2005.  4. Status post laminectomy in 2009 at T1-T2 by Dr.  Gerlene Fee.  5. Predominantly right lower extremity pain, constant in nature,      consistent with neuropathic-type pain.  Apparently, there has been      on MRI within the last year and half per Dr. Murray Hodgkins.  This imaging      study is not available to me at this time.   Sporadic left lower extremity pain not is bothersome to the patient at  this time.   Plan, treatment options were discussed today with Richard Vaughn to include  consideration of Neurontin or  Lyrica. He states he may have tried these  in the past.  He is not sure they have helped significantly.  However,  he would like to trial them one more time.  We also discussed  consideration of medial-branch blocks as well.  Would like to obtain the  results of past lumbar imaging studies.  He would prefer to start on  Lyrica, will start 50 mg titrating up to 3 times a day, and we will see  him back in 1 month.  He has trialed multiple epidural steroid  injections in the last several years, last one was about a year ago,  giving him a very little relief at that time.  He is not interested in  further  epidural steroid injections.  We will see him back in a month.  We also  discussed possibly getting him back involved in a therapeutic exercise  program.           ______________________________  Brantley Stage, M.D.     DMK/MedQ  D:  07/02/2009 10:27:37  T:  07/02/2009 22:40:58  Job #:  [id number]   cc:   Barbette Hair. [place], DO  [address] [place], [place] [address]

## 2011-03-30 NOTE — Assessment & Plan Note (Signed)
[facility]                                 ON-CALL NOTE   MAYO, FAULK                       MRN:          [id number]  DATE:06/21/2007                            DOB:          Jan 11, 1951    TIME OF CALL:  6:52 p.m.   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  He sees Dr. Jonny Ruiz.   TELEPHONE NUMBER:  [id number].   Patient was seen by Dr. Jonny Ruiz in the office today with right lower  quadrant abdominal pain.  Dr. Raphael Gibney main concern was appendicitis so he  ordered a CT scan on the abdomen and pelvis.  Results now show a clearly  normal appendix and really no other acute findings.  They did mention  some bibasilar sub-centimeter nodules, also some renal densities and  they suggested getting renal ultrasounds to follow these up, but again  no source for his pain.  He was prescribed antibiotics and pain  medications today in the office.  I told Alice to tell the patient to go  home and follow the instructions given and to contact Dr. Jonny Ruiz at the  office tomorrow.     Tera Mater. Clent Ridges, MD  Electronically Signed    SAF/MedQ  DD: 06/21/2007  DT: 06/22/2007  Job #: [id number]

## 2011-03-30 NOTE — Op Note (Signed)
NAME:  Richard Vaughn, Richard Vaughn NO.:  [id number]   MEDICAL RECORD NO.:  [id number]          PATIENT TYPE:  AMB   LOCATION:  DAY                          FACILITY:  [facility]   PHYSICIAN:  Ronald L. Earlene Plater, M.D.  DATE OF BIRTH:  03/18/1951   DATE OF PROCEDURE:  04/25/2009  DATE OF DISCHARGE:  04/25/2009                               OPERATIVE REPORT   DIAGNOSES:  Left distal ureterolithiasis with pain and hydronephrosis.   OPERATIVE PROCEDURE:  Cystourethroscopy, left ureteroscopy with basket  stone extraction, placement of left double-J stent.   SURGEON:  Dr. Gaynelle Arabian.   ANESTHESIA:  LMA.   ESTIMATED BLOOD LOSS:  Negligible.   TUBES:  A 26 cm 6-French contoured double pigtail stent with pullout  string.   COMPLICATIONS:  None.   INDICATIONS FOR PROCEDURE:  [name] is a very nice 60 year old black  male who presented with left flank pain, nausea and vomiting for 5 days.  He was seen by his private practice physician on April 21, 2009, and  placed on Levaquin.  He developed nausea and vomiting in the ER on April 24, 2009, and was diagnosed with a 1-2 mm left UVJ stone with dilatation  of the hydroureteronephrosis.  He has not gotten much pain relieved with  Percocet and with continuing problems with pain and nausea, he has  elected to proceed with the above procedure.   PROCEDURE IN DETAIL:  The patient was placed in the supine position.  After proper LMA anesthesia, he was placed in the dorsal lithotomy  position and prepped and draped with Betadine in a sterile fashion.  Cystourethroscopy was performed with a 22.5 [place] Olympus panendoscope.  There was mild trilobar hypertrophy, but the bladder was smooth-walled  and efflux of clear urine was noted from the normally placed right  ureteral orifice.  The left ureteral orifice was in normal location, but  no reflux was noted and it appeared to be inflamed.  Under direct  vision, a 0.038 French sensor wire was placed  into the left renal pelvis  and the distal ureter was dilated with the dilating sheath of ureteral  access catheter.  Ureteroscopy was then performed under direct vision  with a short thin semi-rigid ureteroscope.  The stone was visualized,  grasped with a nitinol basket and extracted intact.  It will be  submitted for stone analysis.  Reinspection of the lower two-thirds of  the ureter revealed there were no other stone fragments.  The  ureteroscope was visually removed and under fluoroscopic guidance a 26-  cm 6-French contoured double pigtail stent was placed and noted to be in  good position within the left renal pelvis within the bladder.  A  pullout string was attached and taped to the penis.  The bladder was  drained.  The panendoscope was removed.  The patient was taken to the  recovery room stable.      Ronald L. Earlene Plater, M.D.  Electronically Signed     RLD/MEDQ  D:  04/25/2009  T:  04/26/2009  Job:  [id number]

## 2011-04-02 NOTE — Assessment & Plan Note (Signed)
[facility]                         GASTROENTEROLOGY OFFICE NOTE   Richard Vaughn, Richard Vaughn                       MRN:          [id number]  DATE:01/02/2007                            DOB:          20-Feb-1951    PROBLEM:  Reflux.   REASON:  [name] has returned for yearly visit.  Reflux symptoms are  well controlled with Nexium.  Attempts at reducing the frequency to  every other day were unsuccessful.  He has a history of an esophageal  stricture and has remained asymptomatic since dilatation therapy.  About  a month ago he had a gastroenteritis characterized by nausea, vomiting  and diarrhea.  Symptoms have subsided except for occasional nausea.  He  did inform me that he underwent a colonoscopy approximately 7 years ago  that apparently was normal.   EXAM:  Pulse 80, blood pressure 118/74, weight 288.   IMPRESSION:  Gastroesophageal reflux disease - well controlled with  proton pump inhibitor therapy.   RECOMMENDATION:  1. Continue Nexium as needed.  2. Follow up colonoscopy in approximately 3 years.     Barbette Hair. Arlyce Dice, MD,FACG  Electronically Signed    RDK/MedQ  DD: 01/02/2007  DT: 01/02/2007  Job #: [id number]

## 2011-04-02 NOTE — Discharge Summary (Signed)
NAME:  Richard Vaughn, LUTEN NO.:  [id number]   MEDICAL RECORD NO.:  [id number]          PATIENT TYPE:  INP   LOCATION:  3017                         FACILITY:  [facility]   PHYSICIAN:  Gordy Savers, MDDATE OF BIRTH:  Jul 22, 1951   DATE OF ADMISSION:  10/16/2008  DATE OF DISCHARGE:  10/20/2008                               DISCHARGE SUMMARY   FINAL DIAGNOSES:  Chest wall pain with left arm paresthesias, partial  left-sided Horner's.   ADDITIONAL DIAGNOSES:  Gastroesophageal reflux disease, history of  chronic low back pain status post partial diskectomy in 1995, and disk  replacement in 2005.   HISTORY OF PRESENT ILLNESS:  The patient is a 60 year old male who  presented to his primary care physician with a chief complaint of chest  pain.  The pain was located in the substernal area with radiation to the  left arm.  He described paresthesias involving the left arm.  There is  no diaphoresis.  The chest pain at times seems to be aggravated by  bending forward.  There was no response to Nexium, ranitidine, or  antacid therapy.   Past medical history is pertinent for a history of a negative stress  test in 2003.  He was admitted at that time for atypical chest pain.   LABORATORY DATA AND HOSPITAL COURSE:  The patient was admitted to the  telemetry setting where serial cardiac enzymes were obtained and  revealed no evidence of myocardial ischemia.  D-dimer was negative.  The  patient was seen in consultation by the GI service, who did not feel  that the pain was GI related.  A 2-D echocardiogram revealed normal LV  function with an ejection fraction of 60% and no wall motion  abnormalities.  During the hospital period, the pain became more  localized to the left chest wall area associated with more paresthesias  involving the left arm.  During the hospital stay, the patient developed  a partial left Horner syndrome.  Evaluation included a chest CT that was  unremarkable without any apical lesions.  Due to his Horner's syndrome,  an MRA of the neck with and without contrast was obtained that revealed  normal findings and no evidence of carotid dissection.  The patient had  MRI of the thoracic and cervical spine area that revealed multilevel  thoracic and cervical disk degeneration with multiple disk protrusions.  There was no cord compression or mass lesions.  The patient was seen in  consultation by the Neurosurgical Service, who felt that the Horner  syndrome was unlikely related to cervical or thoracic spondylosis.   DISPOSITION:  At the time of discharge, the patient was clinically much  improved with much less chest wall pain and arm paresthesias.  The  patient will be discharged with close outpatient followup with his PCP  and also followup with Neurosurgery and possibly Neurology.   DISCHARGE MEDICATIONS:  1. Aspirin 325 mg daily.  2. Nexium 40 mg daily.  3. Ultram 50 mg every 6 hours p.r.n. pain.   The patient will follow up with his PCP within 3 days  of discharge.   CONDITION ON DISCHARGE:  Improved.      Gordy Savers, MD  Electronically Signed     PFK/MEDQ  D:  12/21/2008  T:  12/21/2008  Job:  [id number]

## 2011-04-02 NOTE — Op Note (Signed)
NAME:  Richard Vaughn, Richard Vaughn NO.:  [id number]   MEDICAL RECORD NO.:  [id number]                   PATIENT TYPE:  AMB   LOCATION:  ENDO                                 FACILITY:  [facility]   PHYSICIAN:  Danise Edge, M.D.                DATE OF BIRTH:  11-06-1951   DATE OF PROCEDURE:  09/04/2003  DATE OF DISCHARGE:                                 OPERATIVE REPORT   PROCEDURE:  Colonoscopy and esophagogastroduodenoscopy.   INDICATIONS:  Mr. Justinn Welter is a 60 year old male born [birth year].  Mr. Guzzo has had predominantly daytime heartburn for over 10 years,  unassociated with dysphagia or odynophagia.  His heartburn was well  controlled on Prevacid.  His insurance company no longer covers the  Prevacid.  When he was switched to Prilosec and finally Nexium, he found  Prilosec more effective in preventing heartburn.  Recently he has been  experiencing daytime breakthrough heartburn, usually around mealtime.  He  does not experience nighttime heartburn.  His heartburn is particularly  worse if he consumes tomato-based products.   Mr. Coopman has intermittent, watery, nonbloody diarrhea and is due for  screening colonoscopy with polypectomy to prevent colon cancer.   ALLERGIES:  None.   CURRENT MEDICATIONS:  Prilosec.   PAST MEDICAL HISTORY:  1. Heartburn.  2. Lumbar disk surgery.  3. Lactose intolerance.   FAMILY HISTORY:  Negative for colon cancer.   HABITS:  Mr. Deboer smokes a few cigarettes per day but rarely consumes  alcohol.   ENDOSCOPIST:  Danise Edge, M.D.   PREMEDICATION:  Versed 10 mg, Demerol 100 mg.   PROCEDURE:  Esophagogastroduodenoscopy to rule out Barrett's esophagus and  erosive esophagitis.   DESCRIPTION OF PROCEDURE:  After obtaining informed consent, Mr. Negron was  placed on the left lateral decubitus position.  I administered intravenous  Demerol and intravenous Versed to achieve conscious sedation for the  procedure.   The patient's blood pressure, oxygen saturation and cardiac  rhythm were monitored throughout the procedure and documented in the medical  record.   The Olympus gastroscope was passed through the posterior hypopharynx down to  the proximal esophagus without difficulty.  The hypopharynx, larynx and  vocal cords appeared normal.   ESOPHAGOSCOPY:  The proximal, mid and lower segments of the esophageal  mucosa appear completely normal.  The squamocolumnar junction and  esophagogastric junction are noted at 45 cm from the incisor teeth.  Endoscopically, there is no evidence for the presence of Barrett's esophagus  or erosive esophagitis or esophageal mucosal scarring.   GASTROSCOPY:  Retroflexed view of the gastrocardia and fundus. There is no  endoscopic evidence for the presence of the hiatal hernia. Retroflexed view  of the gastric cardia and fundus was normal.  The diaphragmatic hiatus is  slightly patulous.  The gastric body, antrum and pylorus appear normal.   DUODENOSCOPY:  The duodenal bulb, mid  duodenum and distal duodenum appear  normal.   ASSESSMENT:  Gastroesophageal reflux manifested by daytime heartburn,  associated with a normal esophagogastroduodenoscopy.   PROCEDURE:  Screening proctocolonoscopy.   DESCRIPTION OF PROCEDURE:  Anal inspection was normal.  Digital rectal exam  revealed an non nodular prostate.  The Olympus adult  colonoscope was  introduced into the rectum and advanced to the cecum.  A normal-appearing  ileocecal valve was intubated.  Distal ileum inspected.  Colonic preparation  for the exam today was excellent.  Rectum:  Normal.  Sigmoid colon and descending colon:  Normal.  Splenic flexure:  Normal.  Transverse colon:  Normal.  Hepatic flexure:  Normal.  Ascending colon:  Normal.  Cecum and ileocecal valve:  Normal.  Distal ileum:  Normal.   ASSESSMENT:  Normal screening proctocolonoscopy to the cecum.  No endoscopic  evidence for the presence of  colorectal neoplasia.                                               Danise Edge, M.D.    MJ/MEDQ  D:  09/04/2003  T:  09/04/2003  Job:  [id number]   cc:   Candyce Churn, M.D.  [address] [place]  [place] [address]  Fax: [phone]

## 2011-04-02 NOTE — Assessment & Plan Note (Signed)
[facility]                           PRIMARY CARE OFFICE NOTE   Richard Vaughn, [name]                       MRN:          [id number]  DATE:11/03/2006                            DOB:          06-21-1951    HISTORY AND PHYSICAL.   CHIEF COMPLAINT:  New patient to practice.   HISTORY OF PRESENT ILLNESS:  The patient is a 60 year old African-  American male here to establish primary care.  The patient has not had a  primary care doctor in several years.  He is seen by Dr. Arlyce Dice who is  treating him for esophageal stricture and gastroesophageal reflux  disease.  Patient is known to have atypical chest pain, was admitted in  2003, underwent stress testing which was unremarkable.  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   He underwent partial diskectomy in 1995 and a disk replacement in  February of 2005 of L5.  He has had chronic pain issues since then, has  tried several pain medications and ultimately has been on chronic  tramadol therapy every 6 hours.  Other pain medications/narcotics caused  excessive drowsiness, could not tolerate.   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, but due to his  orthopedic issues has been disabled since surgery.  He has had issues  with poor mood and increased irritability.  He was started on Effexor XR  by his orthopedic physician/pain management specialist and this has  significantly improved his overall mood and short-temperedness.   He denies any history of type 2 diabetes.  He has been fairly heavy for  most of his life.  He notes that he does snore frequently, as noted by  his wife, and does also have daytime somnolence.  Overall, he has poor  sleep quality.   PAST MEDICAL HISTORY SUMMARY:  1. Gastroesophageal reflux disease with NSAID gastritis.  2. Chronic low back pain status post diskectomy in 2005.  3. History of  tobacco abuse.  4. Depression/anxiety.   CURRENT MEDICATIONS:  1. Nexium 40 mg once a day.  2. Ultram 50 mg q.6h.  3. Effexor XR unknown dose.   ALLERGIES TO MEDICATIONS:  NONE KNOWN.   SOCIAL HISTORY:  Patient is married, has 2 grown children.  He lives  with his wife.  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.   FAMILY HISTORY:  1. Mother deceased at the age of 12 secondary to liver cancer.  2. Father died in his mid 32's secondary to complications of stroke.  3. No report of colon cancer.  4. He has had a colonoscopy in the past.   HABITS:  He does not drink.  He has smoked 1/2 pack to 1 pack a day for  the last 10-13 years, no history of recreational drug use.   REVIEW OF SYSTEMS:  No fevers, chills, no HEENT symptoms.  Patient  denies any chest pain, shortness of breath.  Heartburn symptoms are  controlled.  No GU  symptoms.  MUSCULOSKELETAL:  Symptoms as noted above  with chronic daily discomfort, no lower extremity weakness.  All other  systems negative.   PHYSICAL EXAM:  VITALS:  Height is 6 feet 2 inches, weight is 290  pounds, temperature is 97.2, pulse is 79, BP is 126/78 in the left side  in the seated position.  GENERAL:  The patient is a very pleasant somewhat overweight 60 year old  African-American male in no apparent distress.  HEENT:  Normocephalic, atraumatic.  Pupils were equal and reactive to  light bilaterally.  Extraocular muscle motility intact.  Patient was  anicteric.  Conjunctiva was within normal limits.  External auditory  canals, tympanic membranes are clear bilaterally.  OROPHARYNGEAL:  Multiple dental caries with broken teeth upper  dentition.  Otherwise, unremarkable.  NECK:  Supple.  No adenopathy, carotid bruit or thyromegaly.  CHEST:  Normal inspiratory effort.  Chest was clear to auscultation  bilaterally.  No rhonchi, rales or wheezing.  CARDIOVASCULAR:  Regular rate and rhythm.  No significant murmurs, rubs  or  gallops appreciated.  ABDOMEN:  Soft, nontender.  Positive bowel sounds.  No organomegaly.  MUSCULOSKELETAL:  No clubbing, cyanosis or edema.  SKIN:  Warm and dry.  He had intact pedis, dorsalis pulses equal and  symmetric.  MOOD AND AFFECT:  Appropriate.   IMPRESSION RECOMMENDATIONS:  1. Chronic low back pain status post L5 diskectomy.  2. Chronic tobacco abuse.  3. Probable obstructive sleep apnea.  4. Gastroesophageal reflux disease with history of esophageal      stricture.  5. Health maintenance.   RECOMMENDATIONS:  Patient will be tried on Lyrica as an adjunct to his  tramadol in the hopes of decreasing his overall tramadol use.  We talked  about trying to decrease his overall pain threshold and I believe that  obstructive sleep apnea may be interfering with his quality of sleep.  He will be referred to [facility] for possible overnight  polysomnography.   In terms of his tobacco abuse, he was given a prescription for Chantix,  starter pack, and we discussed trying to lower his overall  cardiovascular risk and did review his labs today and his cholesterol  panel was acceptable.  Total cholesterol 155, triglycerides 121, LDL of  100 and HDL of 30.8.  His LFTs were unremarkable, his PSA was normal at  0.8 and his TSH was also normal.   Please note he had mild hematuria, positive blood in his UA.  This will  need to be repeated on his followup visit in 2 or 3 months.     Barbette Hair. Artist Pais, DO  Electronically Signed    RDY/MedQ  DD: 11/03/2006  DT: 11/03/2006  Job #: [id number]

## 2011-04-02 NOTE — H&P (Signed)
NAME:  Richard Vaughn, PETTET NO.:  [id number]   MEDICAL RECORD NO.:  [id number]                   PATIENT TYPE:  EMS   LOCATION:  [facility]                                 FACILITY:  [facility]   PHYSICIAN:  Olga Millers, MD [facility]              DATE OF BIRTH:  19-May-1951   DATE OF ADMISSION:  09/05/2002  DATE OF DISCHARGE:                                HISTORY & PHYSICAL   HISTORY OF PRESENT ILLNESS:  The patient is a pleasant 60 year old male with  no prior coronary artery disease who presents with chest pain.  The patient  does have a history of gastroesophageal reflux disease, as well as back  surgery.  He apparently had a cardiac catheterization approximately seven  years ago  by Dr. Daisy Floro secondary to chest pain.  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  Those records are pending at the time of this dictation.  The  patient also was admitted in 8/00 with chest pain.  He had a Cardiolite that  showed normal profusion, and his ejection fraction was 43%.  However,  subsequent echocardiogram revealed normal LV function.  The patient  typically does not have exertional chest pain.  He does occasionally have  substernal chest pain that he relates to gastroesophageal reflux disease and  is relieved with belching.  This morning, he developed pain in the left  chest area that was described as a sharp sensation.  It radiated to the left  shoulder.  The pain is not pleuritic or positional, and not related to food.  It did not change with exertion.  There was associated shortness of breath  and mild diaphoresis, but there was no nausea or vomiting.  He went to  Urgent Care with questionable improvement in his pain with nitroglycerin.  On arrival to the emergency room, his pain is somewhat improved.  Because of  these symptoms, we were asked to further evaluate.   ALLERGIES:  The patient has no known drug  allergies.   MEDICATIONS AT HOME:  1. Protonix 40 mg p.o. q.d.  2. Vioxx 25 mg p.o. q.d.   SOCIAL HISTORY:  He does smoke, but he does not consume alcohol.   FAMILY HISTORY:  Negative for coronary artery disease.  He does have a  history of stroke in his father.   PAST MEDICAL HISTORY:  There is no diabetes mellitus, hypertension, or  hyperlipidemia.  He does have a history of gastroesophageal reflux disease  described above.  He had a cardiac catheterization on 03/30/96 by Dr.  Daisy Floro.  The circumflex, right coronary artery, and left main were normal.  There is mention of a 60-70% mid-LAD.  However, with intercoronary  nitroglycerin, this improved to 30%, and this was felt to be secondary to  spasm.  The patient also has had back surgery, but  no other surgeries are  noted.   REVIEW OF SYSTEMS:  He denies any headaches, fevers, or chills.  There is no  productive cough or hemoptysis.  There is no history of dysphagia,  odynophagia, melena, or hematochezia.  There is no dysuria or hematuria.  There is rash or seizure activity.  There is no orthopnea, PND, or pedal  edema.  He does occasionally have pain in his legs from his previous back  surgery.  All remaining systems are negative.   PHYSICAL EXAMINATION:  VITAL SIGNS:  Blood pressure 119/73, pulse 66.  He is  afebrile.  He is saturating 95% on room air.  GENERAL:  He is well-developed and well-nourished, in no acute distress.  SKIN:  Warm and dry.  HEENT:  Unremarkable.  NECK:  Supple bilaterally, and there are no bruits noted.  There is no  jugular venous disease and no thyromegaly.  CHEST:  Clear to auscultation, normal expansion.  CARDIOVASCULAR EXAM:  There is a respiratory rate with normal S1 and S2.  There are no murmurs, rubs, or gallops noted.  He does have 2+ carotid and  radial pulses.  ABDOMINAL EXAM:  Nontender, nondistended, positive bowel sounds, no  hepatosplenomegaly, no masses appreciated.  There is no  abdominal bruit.  He  has 2+ femoral pulses without bruits.  EXTREMITIES:  No edema, and I can palpate no cords.  He has 2+ dorsalis  pedis pulses bilaterally.  NEUROLOGIC EXAM:  Grossly intact.   DATA:  His electrocardiogram shows a normal sinus rhythm.  There are minor  nonspecific ST changes but there are no diagnostic changes.   DIAGNOSES:  1. Atypical chest pain.  2. History of gastroesophageal reflux disease.  3. History of questionable spasm of the left anterior descending.  4. Tobacco abuse.   PLAN:  The patient presents with chest pain that is somewhat atypical, and  his electrocardiogram shows no diagnostic changes.  We will treat him with  aspirin and continue his Protonix.  I will rule out myocardial infarction  with serial enzymes, and we will also check a D-dimer to screen for  pulmonary embolus, although I think this is unlikely.  He did travel to  [place] recently.  If D-dimer and enzymes are negative, then we will plan  to proceed with an outpatient Cardiolite for risk verification.  We did  discuss risk factor modifications including discontinuing his tobacco use.  We will make further recommendations once we have the above information.                                                Olga Millers, MD [facility]    BC/MEDQ  D:  09/05/2002  T:  09/05/2002  Job:  [id number]

## 2011-04-02 NOTE — Discharge Summary (Signed)
NAME:  Richard Vaughn, FELDNER NO.:  [id number]   MEDICAL RECORD NO.:  [id number]                   PATIENT TYPE:  INP   LOCATION:  3735                                 FACILITY:  [facility]   PHYSICIAN:  Olga Millers, MD [facility]              DATE OF BIRTH:  Oct 09, 1951   DATE OF ADMISSION:  09/05/2002  DATE OF DISCHARGE:  09/06/2002                           DISCHARGE SUMMARY - REFERRING   PROCEDURES:  Exercise treadmill Cardiolite.   HOSPITAL COURSE:  The patient is a 60 year old male with no known history of  coronary artery disease.  He had onset on the day of admission of a sharp  left-sided chest pain that became an aching sensation.  It radiated to his  left shoulder and was associated with shortness of breath and diaphoresis  but no nausea or vomiting.  There was no change with position change and no  change with deep inspiration.  He received sublingual nitroglycerin by EMS  which helped the pain.  He had no history of prior exertional symptoms, and  his symptoms were similar to his reflux symptoms.  He was admitted to rule  out MI and for further evaluation.   His enzymes were negative for MI, an he had a gaited exercise treadmill on  09/06/02.  The exercise treadmill results showed an apical scar but no  ischemia and an EF of 55%.  The situation was discussed with Dr. Jens Som.  Because his symptoms have not returned and he was ambulating without  difficulty, he was considered stable for discharge on 09/06/02 with  outpatient echocardiogram and M.D. followup arranged.   LABORATORY VALUES:  Hemoglobin 15.4, hematocrit 45.1, WBC 7.9, platelets  183,000.  D-dimer less than 0.22.  Serial CK-MB and troponin-I negative for  MI.  Total cholesterol 172, triglycerides 149, HDL 35, LDL 107.   Chest x-ray:  No evidence for acute pulmonary abnormality, cardiomegaly.   DISCHARGE CONDITION:  Stable.   DISCHARGE DIAGNOSES:  1. Chest pain, negative ischemic by  Cardiolite, negative myocardial     infarction by enzymes.  2. Gastroesophageal reflux disease symptoms.  3. Cardiomegaly by chest x-ray.  4. Osteoarthritis.  5. Tobacco use.  6. Family history of cerebrovascular accident.  7. Mild dyslipidemia with a slightly low HDL and slightly elevated LDL.  8. Status post back surgery.  9. Status post remote cardiac catheterization with no intervention required.   DISCHARGE INSTRUCTIONS:  1. His activity level is to be as tolerated.  2. He is to stick to a low-fat diet.  3. He is get an echocardiogram on 11/6 at 9:30 and follow up with Dr.     Jens Som on 11/19 at 4 p.m.  4. He is to follow up with Dr. Kevan Ny and call for an appointment.    DISCHARGE MEDICATIONS:  1. Protonix 40 mg q.d.  2. Vioxx 25 mg p.o. q.d.  Lavella Hammock, P.A. [facility]                  Olga Millers, MD [facility]    RG/MEDQ  D:  09/06/2002  T:  09/06/2002  Job:  [id number]   cc:   Candyce Churn, MD  [address] [place]  [place] [address]  Fax: [phone]

## 2011-04-13 ENCOUNTER — Other Ambulatory Visit: Payer: Self-pay | Admitting: Physician Assistant

## 2011-06-26 ENCOUNTER — Other Ambulatory Visit: Payer: Self-pay | Admitting: Gastroenterology

## 2011-08-19 LAB — DIFFERENTIAL
Basophils Absolute: 0 10*3/uL (ref 0.0–0.1)
Eosinophils Absolute: 0 10*3/uL (ref 0.0–0.7)
Eosinophils Relative: 0 % (ref 0–5)
Lymphocytes Relative: 21 % (ref 12–46)

## 2011-08-19 LAB — D-DIMER, QUANTITATIVE: D-Dimer, Quant: 0.27 ug/mL-FEU (ref 0.00–0.48)

## 2011-08-19 LAB — CARDIAC PANEL(CRET KIN+CKTOT+MB+TROPI)
CK, MB: 1.3 ng/mL (ref 0.3–4.0)
CK, MB: 1.6 ng/mL (ref 0.3–4.0)
Relative Index: 1.3 (ref 0.0–2.5)
Total CK: 118 U/L (ref 7–232)
Total CK: 119 U/L (ref 7–232)
Troponin I: <0.01 ng/mL (ref 0.00–0.06)

## 2011-08-19 LAB — BASIC METABOLIC PANEL
BUN: 8 mg/dL (ref 6–23)
CO2: 29 mEq/L (ref 19–32)
Chloride: 104 mEq/L (ref 96–112)
GFR calc Af Amer: >60 mL/min (ref >60)
GFR calc Af Amer: >60 mL/min (ref >60)
GFR calc non Af Amer: >60 mL/min (ref >60)
Potassium: 3.9 mEq/L (ref 3.5–5.1)
Sodium: 138 mEq/L (ref 135–145)
Sodium: 140 mEq/L (ref 135–145)

## 2011-08-19 LAB — CBC
HCT: 44.9 % (ref 39.0–52.0)
Hemoglobin: 15.4 g/dL (ref 13.0–17.0)
MCHC: 34.2 g/dL (ref 30.0–36.0)
MCV: 93.8 fL (ref 78.0–100.0)
Platelets: 164 10*3/uL (ref 150–400)
RBC: 4.81 MIL/uL (ref 4.22–5.81)
RDW: 13.1 % (ref 11.5–15.5)

## 2011-08-19 LAB — HEPATIC FUNCTION PANEL
AST: 21 U/L (ref 0–37)
Bilirubin, Direct: <0.1 mg/dL (ref 0.0–0.3)

## 2011-10-17 ENCOUNTER — Other Ambulatory Visit: Payer: Self-pay | Admitting: Gastroenterology

## 2011-11-20 ENCOUNTER — Ambulatory Visit (INDEPENDENT_AMBULATORY_CARE_PROVIDER_SITE_OTHER): Payer: Federal, State, Local not specified - PPO | Admitting: Family Medicine

## 2011-11-20 ENCOUNTER — Encounter: Payer: Self-pay | Admitting: Family Medicine

## 2011-11-20 VITALS — BP 132/86 | HR 76 | Temp 98.3°F | Ht 74.0 in | Wt 286.0 lb

## 2011-11-20 DIAGNOSIS — R197 Diarrhea, unspecified: Secondary | ICD-10-CM

## 2011-11-20 DIAGNOSIS — R300 Dysuria: Secondary | ICD-10-CM

## 2011-11-20 DIAGNOSIS — R112 Nausea with vomiting, unspecified: Secondary | ICD-10-CM

## 2011-11-20 LAB — POCT URINALYSIS DIPSTICK
Ketones, UA: NEGATIVE
Protein, UA: NEGATIVE
Spec Grav, UA: 1.02
pH, UA: 6

## 2011-11-20 MED ORDER — ONDANSETRON HCL 8 MG PO TABS: 8.0000 mg | ORAL_TABLET | Freq: Three times a day (TID) | ORAL | Status: AC | PRN

## 2011-11-20 NOTE — Progress Notes (Signed)
  Subjective:    Patient ID: Richard Vaughn, male    DOB: [birth year], 61 y.o.   MRN: [id number]  HPI  Acute visit. Saturday clinic. Patient presents with 5 day history of some nausea and one-day history of vomiting and diarrhea. Has had some chronic intermittent nausea. Has history of GERD and gastroparesis. Takes Nexium 40 mg daily. Also has chronic back pain and on Opana per orthopedist. He does not think this is causing any nausea. He had some chills but no documented fever over the past few days. Had about 3 watery diarrhea stools yesterday and these were nonbloody. No abdominal pain. Also relates one day history of burning with urination. No gross hematuria. No urinary frequency. Denies any cough. No headaches. No abdominal pain. Does continue to smoke.  No past medical history on file. No past surgical history on file.  reports that he has been smoking Cigarettes.  He has been smoking about .8 packs per day. He does not have any smokeless tobacco history on file. His alcohol and drug histories not on file. family history is not on file. No Known Allergies    Review of Systems  Constitutional: Positive for fatigue. Negative for fever and chills.  HENT: Negative for hearing loss, sore throat and trouble swallowing.   Eyes: Negative for visual disturbance.  Respiratory: Negative for cough and shortness of breath.   Gastrointestinal: Positive for nausea, vomiting and diarrhea. Negative for abdominal pain, constipation and blood in stool.  Genitourinary: Positive for dysuria.  Neurological: Negative for dizziness, syncope and headaches.  Hematological: Negative for adenopathy.       Objective:   Physical Exam  Constitutional: He appears well-developed and well-nourished.  HENT:  Right Ear: External ear normal.  Left Ear: External ear normal.  Mouth/Throat: Oropharynx is clear and moist.  Neck: Neck supple.  Cardiovascular: Normal rate and regular rhythm.   Pulmonary/Chest: Effort  normal and breath sounds normal. No respiratory distress. He has no wheezes. He has no rales.  Abdominal: Soft. Bowel sounds are normal. He exhibits no distension and no mass. There is no tenderness. There is no rebound and no guarding.  Neurological: He is alert.  Skin: No rash noted.          Assessment & Plan:  Nonspecific symptoms of nausea vomiting and diarrhea. He has some chronic low-grade nausea intermittently. Question viral. Zofran 8 mg Q8 hours when necessary for nausea. Drink plenty of fluids. Encouraged to followup with primary if nausea persists for further evaluation.  Dysuria with intermittent burning with urination. No pyuria or other suggestion of infection. Does have trace blood otherwise negative on dipstick. Followup with primary 2 weeks to repeat the urine

## 2011-11-20 NOTE — Patient Instructions (Signed)
Follow up with your primary in 2 weeks to have urine repeated to reassess blood in urine.

## 2012-01-18 ENCOUNTER — Other Ambulatory Visit: Payer: Self-pay | Admitting: Gastroenterology

## 2012-04-13 ENCOUNTER — Other Ambulatory Visit: Payer: Self-pay | Admitting: Gastroenterology

## 2012-06-14 ENCOUNTER — Ambulatory Visit (INDEPENDENT_AMBULATORY_CARE_PROVIDER_SITE_OTHER): Payer: Federal, State, Local not specified - PPO | Admitting: Gastroenterology

## 2012-06-14 ENCOUNTER — Encounter: Payer: Self-pay | Admitting: Gastroenterology

## 2012-06-14 VITALS — BP 148/88 | HR 80 | Ht 74.0 in | Wt 282.6 lb

## 2012-06-14 DIAGNOSIS — K3184 Gastroparesis: Secondary | ICD-10-CM

## 2012-06-14 DIAGNOSIS — K2270 Barrett's esophagus without dysplasia: Secondary | ICD-10-CM

## 2012-06-14 DIAGNOSIS — R112 Nausea with vomiting, unspecified: Secondary | ICD-10-CM

## 2012-06-14 NOTE — Progress Notes (Signed)
History of Present Illness:  Mr. Votta has returned for evaluation of nausea. For approximately 2 weeks he was complaining of moderately severe nausea. This occurred throughout the day. It was not accompanied by fevers, myalgias, arthralgias or diarrhea. Over the past week symptoms have subsided. Prior workup for nausea demonstrated gastroparesis by gastric emptying scan.  Patient has Barrett's esophagus. Last examination was 2011.    Review of Systems: Pertinent positive and negative review of systems were noted in the above HPI section. All other review of systems were otherwise negative.    Current Medications, Allergies, Past Medical History, Past Surgical History, Family History and Social History were reviewed in [employer] [facility] electronic medical record  Vital signs were reviewed in today's medical record. Physical Exam: General: Well developed , well nourished, no acute distress Head: Normocephalic and atraumatic Eyes:  sclerae anicteric, EOMI Ears: Normal auditory acuity Mouth: No deformity or lesions Lungs: Clear throughout to auscultation Heart: Regular rate and rhythm; no murmurs, rubs or bruits Abdomen: Soft, non tender and non distended. No masses, hepatosplenomegaly or hernias noted. Normal Bowel sounds. There is no succussion splash Rectal:deferred Musculoskeletal: Symmetrical with no gross deformities  Pulses:  Normal pulses noted Extremities: No clubbing, cyanosis, edema or deformities noted Neurological: Alert oriented x 4, grossly nonfocal Psychological:  Alert and cooperative. Normal mood and affect

## 2012-06-14 NOTE — Assessment & Plan Note (Signed)
Recent prolonged episode was probably due to gastroparesis. This has subsided spontaneously.  Recommendations #1 should patient have recurrent nausea he was instructed to contact the office where I would try him on Reglan

## 2012-06-14 NOTE — Assessment & Plan Note (Addendum)
The patient has done well on a gastroparesis diet. Should he have breakthrough symptoms then I will try him on a short course of Reglan.

## 2012-06-14 NOTE — Assessment & Plan Note (Signed)
Last endoscopy 2011. Plan followup in 2014

## 2012-06-14 NOTE — Patient Instructions (Addendum)
You have been given a separate informational sheet regarding your tobacco use, the importance of quitting and local resources to help you quit.  Follow up as needed 

## 2012-07-12 ENCOUNTER — Other Ambulatory Visit: Payer: Self-pay | Admitting: Gastroenterology

## 2012-07-21 ENCOUNTER — Ambulatory Visit (INDEPENDENT_AMBULATORY_CARE_PROVIDER_SITE_OTHER)
Admission: RE | Admit: 2012-07-21 | Discharge: 2012-07-21 | Disposition: A | Discharge status: Home / Self Care | Payer: Federal, State, Local not specified - PPO | Source: Ambulatory Visit | Attending: Internal Medicine | Admitting: Internal Medicine

## 2012-07-21 ENCOUNTER — Ambulatory Visit (INDEPENDENT_AMBULATORY_CARE_PROVIDER_SITE_OTHER): Payer: Federal, State, Local not specified - PPO | Admitting: Internal Medicine

## 2012-07-21 ENCOUNTER — Encounter: Payer: Self-pay | Admitting: Internal Medicine

## 2012-07-21 VITALS — BP 126/82 | Temp 98.2°F | Wt 282.0 lb

## 2012-07-21 DIAGNOSIS — M25519 Pain in unspecified shoulder: Secondary | ICD-10-CM

## 2012-07-21 DIAGNOSIS — M25512 Pain in left shoulder: Secondary | ICD-10-CM

## 2012-07-21 MED ORDER — CELECOXIB 200 MG PO CAPS: 200.0000 mg | ORAL_CAPSULE | Freq: Every day | ORAL | Status: DC

## 2012-07-21 NOTE — Assessment & Plan Note (Signed)
61 year old African American male complains of left shoulder pain after falling in the shower. Patient braced himself and fell on left side. He has significant discomfort and decreased range of motion. He has tenderness posterior aspect of left deltoid. Obtain shoulder x-ray to rule out bony abnormality. Patient likely has shoulder strain. Patient advised to use cool compress and sling to immobilize left shoulder. Arrange orthopedic followup.

## 2012-07-21 NOTE — Progress Notes (Signed)
  Subjective:    Patient ID: Richard Vaughn, male    DOB: 05-Feb-1951, 61 y.o.   MRN: [id number]  HPI  61 year old African American male with history of nephrolithiasis, chronic neck pain, and back pain complains of traumatic injury. Yesterday he slipped in shower and fell backwards. He tried to brace himself to avoid back injury and landed on his left shoulder. Patient now has significant left shoulder pain and is unable to lift his arm above his head.  He denies any obvious anatomic defect/abnormality.  Review of Systems Pain does not radiate into his hand  Past Medical History  Diagnosis Date  . GERD (gastroesophageal reflux disease)   . Stricture and stenosis of esophagus   . Gastritis     Mild  . Barrett's esophagus   . Depression   . Gastroparesis   . Hyperglycemia   . Renal cyst   . Nephrolithiasis     History   Social History  . Marital Status: Married    Spouse Name: N/A    Number of Children: N/A  . Years of Education: N/A   Occupational History  . Not on file.   Social History Main Topics  . Smoking status: Current Everyday Smoker -- 0.8 packs/day    Types: Cigarettes  . Smokeless tobacco: Never Used   Comment: Counseling sheet given 05-2012  . Alcohol Use: No  . Drug Use: No  . Sexually Active: Not on file   Other Topics Concern  . Not on file   Social History Narrative  . No narrative on file    Past Surgical History  Procedure Date  . Lumbar disc surgery     replaced L4 and L5  . Cervical disc surgery     Family History  Problem Relation Age of Onset  . Hypertension Father   . Hypertension Mother   . Hypertension Sister     2 other sisters has also  . Hypertension Brother     2nd brother has also  . Colon cancer Neg Hx   . Liver cancer Mother   . Stroke Father     No Known Allergies  Current Outpatient Prescriptions on File Prior to Visit  Medication Sig Dispense Refill  . fish oil-omega-3 fatty acids 1000 MG capsule Take 2 g by  mouth daily.      Marland Kitchen NEXIUM 40 MG capsule TAKE ONE CAPSULE BY MOUTH 30 MINUTES BEFORE MEAL  30 capsule  2  . oxymorphone (OPANA) 10 MG tablet Take 10 mg by mouth 2 (two) times daily.          BP 126/82  Temp 98.2 F (36.8 C) (Oral)  Wt 282 lb (127.914 kg)       Objective:   Physical Exam  Constitutional: He appears well-developed and well-nourished.  Cardiovascular: Normal rate, regular rhythm and normal heart sounds.   Pulmonary/Chest: Effort normal and breath sounds normal. He has no wheezes.  Musculoskeletal:       Tenderness posterior aspect of left deltoid. Patient unable to abduct left arm above 90. Poor range of motion.          Assessment & Plan:

## 2012-07-21 NOTE — Patient Instructions (Signed)
Use shoulder immobilizer as directed Apply cool compress 2-3 times per day We will contact you re: orthopedic referral

## 2012-07-27 NOTE — Progress Notes (Signed)
Quick Note:  Called and spoke with pt and pt states the shoulder is improving. Pt states his shoulder is still sore when he moves in certain directions; but it is better. Pt states he will call office back if his shoulder gets worse. ______

## 2012-07-28 ENCOUNTER — Ambulatory Visit (INDEPENDENT_AMBULATORY_CARE_PROVIDER_SITE_OTHER): Payer: Federal, State, Local not specified - PPO | Admitting: Internal Medicine

## 2012-07-28 VITALS — BP 136/100 | HR 93 | Temp 98.3°F | Wt 275.0 lb

## 2012-07-28 DIAGNOSIS — R112 Nausea with vomiting, unspecified: Secondary | ICD-10-CM

## 2012-07-28 DIAGNOSIS — R040 Epistaxis: Secondary | ICD-10-CM | POA: Insufficient documentation

## 2012-07-28 MED ORDER — ONDANSETRON HCL 4 MG PO TABS: 4.0000 mg | ORAL_TABLET | Freq: Three times a day (TID) | ORAL | Status: AC | PRN

## 2012-07-28 NOTE — Assessment & Plan Note (Signed)
Use zofran for nausea

## 2012-07-28 NOTE — Patient Instructions (Addendum)
Our office will contact you re: ENT referral Use cold compress and apply pressure if nose bleed gets worse.

## 2012-07-28 NOTE — Assessment & Plan Note (Signed)
I doubt posterior bleed.  He has fresh blood right lateral upper nares.  He may need packing or cauterization.  Refer to ENT.  Patient advised to continue to hold Celebrex and fish oil supplement.

## 2012-07-28 NOTE — Progress Notes (Signed)
Subjective:    Patient ID: Richard Vaughn, male    DOB: [birth year], 61 y.o.   MRN: [id number]  HPI  61 year old African American male with history of chronic pain, gastroparesis, and gastroesophageal reflux disease complains of epistaxis. The symptoms started on Monday after episode of vomiting. Patient reports he wretched fairly violently and thinks he ruptured blood vessel in his right nose. There was profuse bleeding. Patient was able to apply pressure and decrease bleeding. However the bleeding has not completely stopped. He is still having intermittent trickling from his right nose.  He has stopped taking his Celebrex and fish oil since Wednesday. He's not taking any agents that contain aspirin.  He experiences periodic episodes of unexplained vomiting.  He is followed by Dr. Arlyce Dice.  He has gastroparesis.  He denies sensation of swallowing blood.  Review of Systems No dizziness  Past Medical History  Diagnosis Date  . GERD (gastroesophageal reflux disease)   . Stricture and stenosis of esophagus   . Gastritis     Mild  . Barrett's esophagus   . Depression   . Gastroparesis   . Hyperglycemia   . Renal cyst   . Nephrolithiasis     History   Social History  . Marital Status: Married    Spouse Name: N/A    Number of Children: N/A  . Years of Education: N/A   Occupational History  . Not on file.   Social History Main Topics  . Smoking status: Current Every Day Smoker -- 0.8 packs/day    Types: Cigarettes  . Smokeless tobacco: Never Used   Comment: Counseling sheet given 05-2012  . Alcohol Use: No  . Drug Use: No  . Sexually Active: Not on file   Other Topics Concern  . Not on file   Social History Narrative  . No narrative on file    Past Surgical History  Procedure Date  . Lumbar disc surgery     replaced L4 and L5  . Cervical disc surgery     Family History  Problem Relation Age of Onset  . Hypertension Father   . Hypertension Mother   .  Hypertension Sister     2 other sisters has also  . Hypertension Brother     2nd brother has also  . Colon cancer Neg Hx   . Liver cancer Mother   . Stroke Father     No Known Allergies  Current Outpatient Prescriptions on File Prior to Visit  Medication Sig Dispense Refill  . celecoxib (CELEBREX) 200 MG capsule Take 1 capsule (200 mg total) by mouth daily.  9 capsule  0  . fish oil-omega-3 fatty acids 1000 MG capsule Take 2 g by mouth daily.      Marland Kitchen NEXIUM 40 MG capsule TAKE ONE CAPSULE BY MOUTH 30 MINUTES BEFORE MEAL  30 capsule  2  . oxymorphone (OPANA) 10 MG tablet Take 10 mg by mouth 2 (two) times daily.          BP 136/100  Pulse 93  Temp 98.3 F (36.8 C) (Oral)  Wt 275 lb (124.739 kg)  SpO2 96%       Objective:   Physical Exam  Constitutional: He is oriented to person, place, and time. He appears well-developed and well-nourished.  HENT:  Head: Normocephalic and atraumatic.       Fresh blood right lateral , upper nares.  Cardiovascular: Normal rate, regular rhythm and normal heart sounds.   Pulmonary/Chest: Effort normal. He has  wheezes.  Neurological: He is alert and oriented to person, place, and time.          Assessment & Plan:

## 2012-11-23 ENCOUNTER — Encounter: Payer: Self-pay | Admitting: Gastroenterology

## 2012-12-14 ENCOUNTER — Encounter: Payer: Self-pay | Admitting: Gastroenterology

## 2013-01-11 ENCOUNTER — Ambulatory Visit (AMBULATORY_SURGERY_CENTER): Payer: Federal, State, Local not specified - PPO | Admitting: *Deleted

## 2013-01-11 ENCOUNTER — Encounter: Payer: Self-pay | Admitting: Gastroenterology

## 2013-01-11 VITALS — Ht 74.0 in | Wt 290.0 lb

## 2013-01-11 DIAGNOSIS — K2270 Barrett's esophagus without dysplasia: Secondary | ICD-10-CM

## 2013-01-25 ENCOUNTER — Ambulatory Visit (AMBULATORY_SURGERY_CENTER): Payer: Federal, State, Local not specified - PPO | Admitting: Gastroenterology

## 2013-01-25 ENCOUNTER — Encounter: Payer: Self-pay | Admitting: Gastroenterology

## 2013-01-25 VITALS — BP 156/110 | HR 61 | Temp 98.4°F | Resp 14 | Ht 74.0 in | Wt 290.0 lb

## 2013-01-25 DIAGNOSIS — K2970 Gastritis, unspecified, without bleeding: Secondary | ICD-10-CM

## 2013-01-25 DIAGNOSIS — K2270 Barrett's esophagus without dysplasia: Secondary | ICD-10-CM

## 2013-01-25 DIAGNOSIS — D131 Benign neoplasm of stomach: Secondary | ICD-10-CM

## 2013-01-25 MED ORDER — SODIUM CHLORIDE 0.9 % IV SOLN: 500.0000 mL | INTRAVENOUS | Status: DC

## 2013-01-25 NOTE — Patient Instructions (Addendum)
Handouts were given to your care partner on barrett's esophagus and gastritis.  Per Dr. Arlyce Dice continue current medications starting today.  Please call if any questions or concerns.    YOU HAD AN ENDOSCOPIC PROCEDURE TODAY AT THE [facility]: Refer to the procedure report that was given to you for any specific questions about what was found during the examination.  If the procedure report does not answer your questions, please call your gastroenterologist to clarify.  If you requested that your care partner not be given the details of your procedure findings, then the procedure report has been included in a sealed envelope for you to review at your convenience later.  YOU SHOULD EXPECT: Some feelings of bloating in the abdomen. Passage of more gas than usual.  Walking can help get rid of the air that was put into your GI tract during the procedure and reduce the bloating. If you had a lower endoscopy (such as a colonoscopy or flexible sigmoidoscopy) you may notice spotting of blood in your stool or on the toilet paper. If you underwent a bowel prep for your procedure, then you may not have a normal bowel movement for a few days.  DIET: Your first meal following the procedure should be a light meal and then it is ok to progress to your normal diet.  A half-sandwich or bowl of soup is an example of a good first meal.  Heavy or fried foods are harder to digest and may make you feel nauseous or bloated.  Likewise meals heavy in dairy and vegetables can cause extra gas to form and this can also increase the bloating.  Drink plenty of fluids but you should avoid alcoholic beverages for 24 hours.  ACTIVITY: Your care partner should take you home directly after the procedure.  You should plan to take it easy, moving slowly for the rest of the day.  You can resume normal activity the day after the procedure however you should NOT DRIVE or use heavy machinery for 24 hours (because of the sedation  medicines used during the test).    SYMPTOMS TO REPORT IMMEDIATELY: A gastroenterologist can be reached at any hour.  During normal business hours, 8:30 AM to 5:00 PM Monday through Friday, call [phone].  After hours and on weekends, please call the GI answering service at [phone] who will take a message and have the physician on call contact you.     Following upper endoscopy (EGD)  Vomiting of blood or coffee ground material  New chest pain or pain under the shoulder blades  Painful or persistently difficult swallowing  New shortness of breath  Fever of 100F or higher  Black, tarry-looking stools  FOLLOW UP: If any biopsies were taken you will be contacted by phone or by letter within the next 1-3 weeks.  Call your gastroenterologist if you have not heard about the biopsies in 3 weeks.  Our staff will call the home number listed on your records the next business day following your procedure to check on you and address any questions or concerns that you may have at that time regarding the information given to you following your procedure. This is a courtesy call and so if there is no answer at the home number and we have not heard from you through the emergency physician on call, we will assume that you have returned to your regular daily activities without incident.  SIGNATURES/CONFIDENTIALITY: You and/or your care partner have signed paperwork which  will be entered into your electronic medical record.  These signatures attest to the fact that that the information above on your After Visit Summary has been reviewed and is understood.  Full responsibility of the confidentiality of this discharge information lies with you and/or your care-partner.

## 2013-01-25 NOTE — Op Note (Signed)
[facility] [address] [place], [address]   ENDOSCOPY PROCEDURE REPORT  PATIENT: Daanish, Copes  MR#: [id number] BIRTHDATE: 02-Sep-1951 , 61  yrs. old GENDER: Male ENDOSCOPIST: Louis Meckel, MD REFERRED BY: PROCEDURE DATE:  01/25/2013 PROCEDURE:  EGD w/ biopsy ASA CLASS:     Class II INDICATIONS:  history of Barrett's esophagus. MEDICATIONS: MAC sedation, administered by CRNA and Propofol (Diprivan) 180 mg IV TOPICAL ANESTHETIC:  DESCRIPTION OF PROCEDURE: After the risks benefits and alternatives of the procedure were thoroughly explained, informed consent was obtained.  The [facility] [id number] endoscope was introduced through the mouth and advanced to the third portion of the duodenum. Without limitations.  The instrument was slowly withdrawn as the mucosa was fully examined.      Of the GE junction there were areas of gastric appearing epithelium above the Z line and interruption Z line.  The area was irrigated with methylene-impregnated normal saline.  Biopsies were selected taken at areas of increased uptake which were few.   Of the GE junction there were areas of gastric appearing epithelium above the Z line and interruption Z line.  The area was irrigated with methylene-impregnated normal saline.  Biopsies were selected taken at areas of increased uptake which were few.   In the gastric fundus there several folds with superficial erosions.  Biopsies were taken.   The remainder of the upper endoscopy exam was otherwise normal.  Retroflexed views revealed no abnormalities. The scope was then withdrawn from the patient and the procedure completed.  COMPLICATIONS: There were no complications. ENDOSCOPIC IMPRESSION: 1.  Barrett's esophagus 2.  gastritis  RECOMMENDATIONS: 1.  continue current medications 2.  await biopsy findings REPEAT EXAM:  eSigned:  Louis Meckel, MD 01/25/2013 12:00 PM   NF:[id number] Artist Pais, DO

## 2013-01-25 NOTE — Progress Notes (Signed)
No problems noted in the recovery room. Maw  Patient did not experience any of the following events: a burn prior to discharge; a fall within the facility; wrong site/side/patient/procedure/implant event; or a hospital transfer or hospital admission upon discharge from the facility. ([id number])Patient did not have preoperative order for IV antibiotic SSI prophylaxis. [phone])

## 2013-01-25 NOTE — Progress Notes (Signed)
Called to room to assist during endoscopic procedure.  Patient ID and intended procedure confirmed with present staff. Received instructions for my participation in the procedure from the performing physician.  

## 2013-01-25 NOTE — Progress Notes (Signed)
Report to pacu rn, vss, bbs=clear 

## 2013-01-26 ENCOUNTER — Telehealth: Payer: Self-pay

## 2013-01-26 NOTE — Telephone Encounter (Signed)
I left a message on the pt's answering machine to call if any questions or concerns at [phone]. Maw

## 2013-02-01 ENCOUNTER — Encounter: Payer: Self-pay | Admitting: Gastroenterology

## 2013-08-01 ENCOUNTER — Other Ambulatory Visit (INDEPENDENT_AMBULATORY_CARE_PROVIDER_SITE_OTHER): Payer: Federal, State, Local not specified - PPO

## 2013-08-01 DIAGNOSIS — Z0000 Encounter for general adult medical examination without abnormal findings: Secondary | ICD-10-CM

## 2013-08-01 LAB — HEPATIC FUNCTION PANEL
Alkaline Phosphatase: 82 U/L (ref 39–117)
Bilirubin, Direct: 0.1 mg/dL (ref 0.0–0.3)

## 2013-08-01 LAB — CBC WITH DIFFERENTIAL/PLATELET
Eosinophils Relative: 0.9 % (ref 0.0–5.0)
HCT: 45 % (ref 39.0–52.0)
Monocytes Relative: 6.9 % (ref 3.0–12.0)
Neutrophils Relative %: 65.7 % (ref 43.0–77.0)
Platelets: 187 10*3/uL (ref 150.0–400.0)
RBC: 4.91 Mil/uL (ref 4.22–5.81)
WBC: 9.6 10*3/uL (ref 4.5–10.5)

## 2013-08-01 LAB — TSH: TSH: 1.25 u[IU]/mL (ref 0.35–5.50)

## 2013-08-01 LAB — POCT URINALYSIS DIPSTICK
Protein, UA: NEGATIVE
Spec Grav, UA: 1.02
Urobilinogen, UA: 0.2
pH, UA: 5.5

## 2013-08-01 LAB — LIPID PANEL
Cholesterol: 139 mg/dL (ref 0–200)
HDL: 29 mg/dL — ABNORMAL LOW (ref >39.00)
LDL Cholesterol: 93 mg/dL (ref 0–99)
Triglycerides: 87 mg/dL (ref 0.0–149.0)
VLDL: 17.4 mg/dL (ref 0.0–40.0)

## 2013-08-01 LAB — BASIC METABOLIC PANEL
BUN: 11 mg/dL (ref 6–23)
Creatinine, Ser: 1.1 mg/dL (ref 0.4–1.5)
GFR: 87.89 mL/min (ref >60.00)
Potassium: 4.1 mEq/L (ref 3.5–5.1)

## 2013-08-01 LAB — PSA: PSA: 0.29 ng/mL (ref 0.10–4.00)

## 2013-08-07 ENCOUNTER — Encounter: Payer: Self-pay | Admitting: Internal Medicine

## 2013-08-07 ENCOUNTER — Ambulatory Visit (INDEPENDENT_AMBULATORY_CARE_PROVIDER_SITE_OTHER): Payer: Federal, State, Local not specified - PPO | Admitting: Internal Medicine

## 2013-08-07 VITALS — BP 130/90 | HR 76 | Temp 97.9°F | Ht 74.0 in | Wt 294.0 lb

## 2013-08-07 DIAGNOSIS — F17200 Nicotine dependence, unspecified, uncomplicated: Secondary | ICD-10-CM

## 2013-08-07 DIAGNOSIS — Z0000 Encounter for general adult medical examination without abnormal findings: Secondary | ICD-10-CM | POA: Insufficient documentation

## 2013-08-07 DIAGNOSIS — I10 Essential (primary) hypertension: Secondary | ICD-10-CM

## 2013-08-07 DIAGNOSIS — G4733 Obstructive sleep apnea (adult) (pediatric): Secondary | ICD-10-CM

## 2013-08-07 DIAGNOSIS — R3129 Other microscopic hematuria: Secondary | ICD-10-CM

## 2013-08-07 LAB — URINALYSIS, ROUTINE W REFLEX MICROSCOPIC
Ketones, ur: NEGATIVE
Specific Gravity, Urine: 1.025 (ref 1.000–1.030)
Total Protein, Urine: NEGATIVE
Urine Glucose: NEGATIVE
pH: 6 (ref 5.0–8.0)

## 2013-08-07 MED ORDER — LOSARTAN POTASSIUM 25 MG PO TABS: 25.0000 mg | ORAL_TABLET | Freq: Every day | ORAL | Status: DC

## 2013-08-07 MED ORDER — VARENICLINE TARTRATE 0.5 MG X 11 & 1 MG X 42 PO MISC: ORAL | Status: DC

## 2013-08-07 MED ORDER — PRAVASTATIN SODIUM 40 MG PO TABS: 40.0000 mg | ORAL_TABLET | Freq: Every day | ORAL | Status: DC

## 2013-08-07 NOTE — Progress Notes (Signed)
Subjective:    Patient ID: Richard Vaughn, male    DOB: [birth year], 62 y.o.   MRN: [id number]  HPI  62 year old African American male with history of chronic back pain, gastroparesis and gastroesophageal reflux disease for routine physical. He denies any significant interval history. Patient gained 4 pounds since previous visit.  Patient reports his sister recently suffered from stroke. She was long-term smoker and has hx of hypertension.   He is still smoking and is reluctant to consider smoking cessation. He has not tried Chantix in the past.  We reviewed his screening blood work in detail.  Review of Systems  Constitutional: Negative for activity change, appetite change and unexpected weight change.  Eyes: Negative for visual disturbance.  Respiratory: Negative for cough, chest tightness and shortness of breath.   Cardiovascular: Negative for chest pain.  Genitourinary: Negative for difficulty urinating.  Neurological: Negative for headaches.  Gastrointestinal: Negative for abdominal pain, heartburn melena or hematochezia Psych: Negative for depression or anxiety Endo:  Occasional ED   Past Medical History  Diagnosis Date  . GERD (gastroesophageal reflux disease)   . Stricture and stenosis of esophagus   . Gastritis     Mild  . Barrett's esophagus   . Depression   . Gastroparesis   . Hyperglycemia   . Renal cyst   . Nephrolithiasis   . Chronic back pain     History   Social History  . Marital Status: Married    Spouse Name: N/A    Number of Children: N/A  . Years of Education: N/A   Occupational History  . Not on file.   Social History Main Topics  . Smoking status: Current Every Day Smoker -- 0.80 packs/day    Types: Cigarettes  . Smokeless tobacco: Never Used     Comment: Counseling sheet given 05-2012  . Alcohol Use: No  . Drug Use: No  . Sexual Activity: Not on file   Other Topics Concern  . Not on file   Social History Narrative  . No  narrative on file    Past Surgical History  Procedure Laterality Date  . Lumbar disc surgery  2005, 2010    replaced L4 and L5  . Cervical disc surgery  2012    Family History  Problem Relation Age of Onset  . Hypertension Father   . Stroke Father   . Hypertension Mother   . Liver cancer Mother   . Hypertension Sister     2 other sisters has also  . Hypertension Brother     2nd brother has also  . Colon cancer Neg Hx   . Esophageal cancer Neg Hx   . Rectal cancer Neg Hx   . Stomach cancer Neg Hx     No Known Allergies  Current Outpatient Prescriptions on File Prior to Visit  Medication Sig Dispense Refill  . NEXIUM 40 MG capsule TAKE ONE CAPSULE BY MOUTH 30 MINUTES BEFORE MEAL  30 capsule  2  . oxymorphone (OPANA ER) 20 MG 12 hr tablet Take 20 mg by mouth daily.      Marland Kitchen oxymorphone (OPANA) 10 MG tablet Take 10 mg by mouth as needed for pain.       No current facility-administered medications on file prior to visit.    BP 130/90  Pulse 76  Temp(Src) 97.9 F (36.6 C) (Oral)  Ht 6\' 2"  (1.88 m)  Wt 294 lb (133.358 kg)  BMI 37.73 kg/m2      Objective:  Physical Exam  Constitutional: He is oriented to person, place, and time. He appears well-developed and well-nourished.  HENT:  Head: Normocephalic and atraumatic.  Right Ear: External ear normal.  Left Ear: External ear normal.  Mouth/Throat: Oropharynx is clear and moist.  Poor dentition  Eyes: Conjunctivae and EOM are normal. Pupils are equal, round, and reactive to light.  Neck: Neck supple.  No carotid bruit  Cardiovascular: Normal rate, regular rhythm and normal heart sounds.   No murmur heard. Pulmonary/Chest: Effort normal and breath sounds normal. He has no wheezes.  Abdominal: Soft. Bowel sounds are normal. He exhibits no mass. There is no tenderness.  Genitourinary: Prostate normal. Guaiac negative stool.  Musculoskeletal: Normal range of motion. He exhibits no edema.  Lymphadenopathy:    He has  no cervical adenopathy.  Neurological: He is alert and oriented to person, place, and time. No cranial nerve deficit.  Skin: Skin is warm and dry.  Psychiatric: He has a normal mood and affect. His behavior is normal.          Assessment & Plan:

## 2013-08-07 NOTE — Assessment & Plan Note (Signed)
I strongly urge tobacco cessation. Trial of Chantix.

## 2013-08-07 NOTE — Assessment & Plan Note (Signed)
Reviewed adult health maintenance protocols.  Patient counseled on weight loss. He has history of obstructive sleep apnea but does not use CPAP. Refer for split-night sleep study. Aspect however to repeat sleep study. He reports completing colonoscopy as per Dr. Arlyce Dice. Obtain copy of previous colonoscopy report. Patient declines influenza vaccine.  Patient's 10 year cardiovascular risk is 13.5%. Start Losartan and pravastatin.  Arrange follow up blood work.

## 2013-08-07 NOTE — Patient Instructions (Signed)
Please complete the following lab tests before your next follow up appointment: BMET - 401.9 FLP, LFTs - 272.4 

## 2013-08-09 ENCOUNTER — Other Ambulatory Visit: Payer: Self-pay | Admitting: Internal Medicine

## 2013-08-09 DIAGNOSIS — G4733 Obstructive sleep apnea (adult) (pediatric): Secondary | ICD-10-CM

## 2013-08-28 ENCOUNTER — Other Ambulatory Visit (INDEPENDENT_AMBULATORY_CARE_PROVIDER_SITE_OTHER): Payer: Federal, State, Local not specified - PPO

## 2013-08-28 DIAGNOSIS — I10 Essential (primary) hypertension: Secondary | ICD-10-CM

## 2013-08-28 DIAGNOSIS — E785 Hyperlipidemia, unspecified: Secondary | ICD-10-CM

## 2013-08-28 LAB — HEPATIC FUNCTION PANEL
ALT: 27 U/L (ref 0–53)
AST: 17 U/L (ref 0–37)
Alkaline Phosphatase: 73 U/L (ref 39–117)
Bilirubin, Direct: 0.1 mg/dL (ref 0.0–0.3)
Total Bilirubin: 0.7 mg/dL (ref 0.3–1.2)

## 2013-08-28 LAB — BASIC METABOLIC PANEL
BUN: 11 mg/dL (ref 6–23)
GFR: 86.95 mL/min (ref >60.00)
Potassium: 4 mEq/L (ref 3.5–5.1)
Sodium: 138 mEq/L (ref 135–145)

## 2013-08-28 LAB — LIPID PANEL
Cholesterol: 145 mg/dL (ref 0–200)
VLDL: 25.4 mg/dL (ref 0.0–40.0)

## 2013-09-04 ENCOUNTER — Ambulatory Visit: Payer: Federal, State, Local not specified - PPO | Admitting: Internal Medicine

## 2013-09-17 ENCOUNTER — Encounter (HOSPITAL_BASED_OUTPATIENT_CLINIC_OR_DEPARTMENT_OTHER): Payer: Federal, State, Local not specified - PPO

## 2013-09-20 ENCOUNTER — Other Ambulatory Visit: Payer: Self-pay

## 2013-09-24 ENCOUNTER — Ambulatory Visit: Payer: Federal, State, Local not specified - PPO | Admitting: Internal Medicine

## 2013-09-24 DIAGNOSIS — Z0289 Encounter for other administrative examinations: Secondary | ICD-10-CM

## 2013-09-30 ENCOUNTER — Other Ambulatory Visit: Payer: Self-pay | Admitting: Gastroenterology

## 2013-12-08 ENCOUNTER — Other Ambulatory Visit: Payer: Self-pay | Admitting: Internal Medicine

## 2013-12-21 ENCOUNTER — Telehealth: Payer: Self-pay | Admitting: Gastroenterology

## 2013-12-21 MED ORDER — ESOMEPRAZOLE MAGNESIUM 40 MG PO CPDR: 40.0000 mg | DELAYED_RELEASE_CAPSULE | Freq: Every day | ORAL | Status: DC

## 2013-12-21 NOTE — Telephone Encounter (Signed)
Called pt to inform med 90 day supply sent to [facility]

## 2014-01-18 ENCOUNTER — Ambulatory Visit (INDEPENDENT_AMBULATORY_CARE_PROVIDER_SITE_OTHER): Payer: Federal, State, Local not specified - PPO | Admitting: Gastroenterology

## 2014-01-18 ENCOUNTER — Encounter: Payer: Self-pay | Admitting: Gastroenterology

## 2014-01-18 VITALS — BP 130/82 | HR 70 | Ht 74.0 in | Wt 285.6 lb

## 2014-01-18 DIAGNOSIS — R110 Nausea: Secondary | ICD-10-CM

## 2014-01-18 DIAGNOSIS — K3184 Gastroparesis: Secondary | ICD-10-CM

## 2014-01-18 MED ORDER — METOCLOPRAMIDE HCL 5 MG PO TABS: ORAL_TABLET | ORAL | Status: DC

## 2014-01-18 NOTE — Progress Notes (Signed)
01/18/2014 Richard Vaughn [id number] 07-23-1951   History of Present Illness:  This is a 63 year old male who is known to Dr. Deatra Ina.  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  He has been on narcotics for chronic pain related to back and neck surgeries for approximately 8 years.  He also has Barrett's esophagus for which he takes Nexium 40 mg daily.  Last EGD was in 01/2013 at which time he was also found to have gastritis.  Biopsies showed Barrett's without dysplasia.  Recommended repeat in 3 years from that time.  He last saw Dr. Deatra Ina in 05/2012 with complaints of nausea and vomiting.  Plan was to try reglan if issue returned.  Patient states that sometimes he does well for a couple of months, but then the nausea returns and will last weeks at times.  Nausea is constant throughout the day and sometimes prevents him from sleeping.  Occasionally vomits, but not necessarily related to eating.  Has taken zofran without relief and phenergan helps sometimes, but makes him tired.  He had been smoking marijuana, which helps with the nausea, but then had a positive urine drug screen though pain management so he has been required to discontinue the use of that.  Current Medications, Allergies, Past Medical History, Past Surgical History, Family History and Social History were reviewed in [employer] record.   Physical Exam: BP 130/82  Pulse 70  Ht 6\' 2"  (1.88 m)  Wt 285 lb 9.6 oz (129.547 kg)  BMI 36.65 kg/m2 General: Well developed black male in no acute distress Head: Normocephalic and atraumatic Eyes:  Sclerae anicteric, conjunctiva pink  Ears: Normal auditory acuity Lungs: Clear throughout to auscultation Heart: Regular rate and rhythm Abdomen: Soft, non-distended.  Normal bowel sounds.  Non-tender. Musculoskeletal: Symmetrical with no gross deformities  Extremities: No edema  Neurological: Alert oriented x 4, grossly  non-focal Psychological:  Alert and cooperative. Normal mood and affect  Assessment and Recommendations: -Gastroparesis with recurrent nausea:  Likely secondary to long-term narcotic use.  No significant relief with zofran or phenergan.  Will start Reglan 5 mg ACHS for 4 weeks and see how he responds to that.  Will have him come back for follow-up and if he is doing well with it then may want to consider switching to domperidone if going to need the medication long-term.  He is also going to speak with his pain management physician about trying to get off of the narcotics.  Continue Nexium 40 mg daily as well.

## 2014-01-18 NOTE — Patient Instructions (Signed)
We have sent the following medications to your pharmacy for you to pick up at your convenience: Reglan 5 mg, take one tablet by mouth before meals and at bedtime  It has been recommended to you that you have a(n) Colonoscopy completed. Per your request, we did not schedule the procedure(s) today. Please contact our office at [phone] should you decide to have the procedure completed.  Follow up appointment is scheduled with Dr. Deatra Ina for 02/27/2014 at 845 am.

## 2014-01-21 NOTE — Progress Notes (Signed)
Reviewed and agree with management. Robert D. Kaplan, M.D., FACG  

## 2014-02-27 ENCOUNTER — Ambulatory Visit: Payer: Federal, State, Local not specified - PPO | Admitting: Gastroenterology

## 2014-04-05 ENCOUNTER — Telehealth: Payer: Self-pay | Admitting: Gastroenterology

## 2014-04-05 MED ORDER — NEXIUM 40 MG PO CPDR: 40.0000 mg | DELAYED_RELEASE_CAPSULE | Freq: Every day | ORAL | Status: DC

## 2014-04-05 NOTE — Telephone Encounter (Signed)
Resent Nexium - checked "dispense as written" box to ensure name brand

## 2014-04-09 ENCOUNTER — Telehealth: Payer: Self-pay | Admitting: Gastroenterology

## 2014-04-09 MED ORDER — NEXIUM 40 MG PO CPDR: 40.0000 mg | DELAYED_RELEASE_CAPSULE | Freq: Every day | ORAL | Status: DC

## 2014-04-09 NOTE — Telephone Encounter (Signed)
Called patient to inform that med brand name was sent in to [facility]

## 2014-09-05 ENCOUNTER — Other Ambulatory Visit: Payer: Self-pay | Admitting: Gastroenterology

## 2014-10-08 ENCOUNTER — Telehealth: Payer: Self-pay | Admitting: Gastroenterology

## 2014-10-08 MED ORDER — NEXIUM 40 MG PO CPDR: 40.0000 mg | DELAYED_RELEASE_CAPSULE | Freq: Every day | ORAL | Status: DC

## 2014-10-08 NOTE — Telephone Encounter (Signed)
Called patient to inform I sent in a 30 day supply to [facility] to last him till his mail order arrives

## 2014-10-25 ENCOUNTER — Telehealth: Payer: Self-pay | Admitting: Gastroenterology

## 2014-10-28 NOTE — Telephone Encounter (Signed)
I am going to schedule him for a direct colon in the [facility]-okay with you?

## 2014-10-28 NOTE — Telephone Encounter (Signed)
ok 

## 2014-10-28 NOTE — Telephone Encounter (Signed)
He was originally an [facility] nutrition[facility] patient. It has been 11 years since his last colon that I can tell. No biopsy reports.

## 2014-10-28 NOTE — Telephone Encounter (Signed)
I have left message for the patient to call back 

## 2014-10-29 NOTE — Telephone Encounter (Signed)
Advised and scheduled

## 2014-10-29 NOTE — Telephone Encounter (Signed)
ok 

## 2014-10-30 ENCOUNTER — Other Ambulatory Visit: Payer: Self-pay | Admitting: Internal Medicine

## 2014-11-05 ENCOUNTER — Telehealth: Payer: Self-pay | Admitting: Internal Medicine

## 2014-11-05 ENCOUNTER — Other Ambulatory Visit: Payer: Self-pay | Admitting: Internal Medicine

## 2014-11-05 MED ORDER — LOSARTAN POTASSIUM 25 MG PO TABS: 25.0000 mg | ORAL_TABLET | Freq: Every day | ORAL | Status: DC

## 2014-11-05 NOTE — Telephone Encounter (Signed)
Richard Vaughn has been scheduled for 12/06/14 . He is requesting a rx on  LOSARTAN   Phamacy:

## 2014-11-05 NOTE — Telephone Encounter (Signed)
rx sent in electronically 

## 2014-11-29 ENCOUNTER — Other Ambulatory Visit: Payer: Self-pay | Admitting: Internal Medicine

## 2014-12-05 ENCOUNTER — Telehealth: Payer: Self-pay | Admitting: Internal Medicine

## 2014-12-05 MED ORDER — LOSARTAN POTASSIUM 25 MG PO TABS: ORAL_TABLET | ORAL | Status: DC

## 2014-12-05 NOTE — Telephone Encounter (Signed)
Called pt to r/s appt due to inclement weather(office closed tomorrow, 12/06/14).  Pt states he took his last bp pill today and needs a refill to hold him until his new appt date of 12/27/14.  Please send rx for losartan (COZAAR) 25 MG tablet to [facility].

## 2014-12-06 ENCOUNTER — Ambulatory Visit: Payer: Federal, State, Local not specified - PPO | Admitting: Internal Medicine

## 2014-12-13 ENCOUNTER — Ambulatory Visit (AMBULATORY_SURGERY_CENTER): Payer: Self-pay

## 2014-12-13 VITALS — Ht 74.75 in | Wt 283.0 lb

## 2014-12-13 DIAGNOSIS — Z1211 Encounter for screening for malignant neoplasm of colon: Secondary | ICD-10-CM

## 2014-12-13 MED ORDER — SUPREP BOWEL PREP KIT 17.5-3.13-1.6 GM/177ML PO SOLN: 1.0000 | Freq: Once | ORAL | Status: DC

## 2014-12-13 NOTE — Progress Notes (Signed)
No allergies to eggs or soy No past problems with anesthesia No home oxygen No diet/weight loss meds  Has email  Emmi instructions given for colonoscopy 

## 2014-12-20 ENCOUNTER — Ambulatory Visit (AMBULATORY_SURGERY_CENTER): Payer: Federal, State, Local not specified - PPO | Admitting: Gastroenterology

## 2014-12-20 ENCOUNTER — Encounter: Payer: Self-pay | Admitting: Gastroenterology

## 2014-12-20 VITALS — BP 127/89 | HR 17 | Temp 98.0°F | Resp 22 | Ht 74.75 in | Wt 283.0 lb

## 2014-12-20 DIAGNOSIS — D123 Benign neoplasm of transverse colon: Secondary | ICD-10-CM

## 2014-12-20 DIAGNOSIS — D125 Benign neoplasm of sigmoid colon: Secondary | ICD-10-CM

## 2014-12-20 DIAGNOSIS — Z1211 Encounter for screening for malignant neoplasm of colon: Secondary | ICD-10-CM

## 2014-12-20 DIAGNOSIS — K5730 Diverticulosis of large intestine without perforation or abscess without bleeding: Secondary | ICD-10-CM

## 2014-12-20 MED ORDER — SODIUM CHLORIDE 0.9 % IV SOLN: 500.0000 mL | INTRAVENOUS | Status: DC

## 2014-12-20 NOTE — Op Note (Signed)
[place]  [address] [place], [id number]   COLONOSCOPY PROCEDURE REPORT  PATIENT: Richard Vaughn, Richard Vaughn  MR#: [id number] BIRTHDATE: [birth year] , 63  yrs. old GENDER: male ENDOSCOPIST: Inda Castle, MD REFERRED [id number] Yoo, DO PROCEDURE DATE:  12/20/2014 PROCEDURE:   Colonoscopy with snare polypectomy and Colonoscopy with cold biopsy polypectomy First Screening Colonoscopy - Avg.  risk and is 50 yrs.  old or older - No.  Prior Negative Screening - Now for repeat screening. 10 or more years since last screening  History of Adenoma - Now for follow-up colonoscopy & has been > or = to 3 yrs.  N/A  Polyps Removed Today? Yes. ASA CLASS:   Class II INDICATIONS:average risk patient for colon cancer. MEDICATIONS: Monitored anesthesia care and Propofol 200 mg IV  DESCRIPTION OF PROCEDURE:   After the risks benefits and alternatives of the procedure were thoroughly explained, informed consent was obtained.  The digital rectal exam revealed no abnormalities of the rectum.   The [id number]  endoscope was introduced through the anus and advanced to the cecum, which was identified by both the appendix and ileocecal valve. No adverse events experienced.   The quality of the prep was excellent using Suprep  The instrument was then slowly withdrawn as the colon was fully examined.      COLON FINDINGS: A sessile polyp measuring 3 mm in size was found in the distal transverse colon.  A polypectomy was performed with a cold snare.  The resection was complete, the polyp tissue was completely retrieved and sent to histology.   There was moderate diverticulosis noted in the descending colon.   A sessile polyp measuring 1 mm in size was found in the distal sigmoid colon.  A polypectomy was performed with cold forceps.   The examination was otherwise normal.  Retroflexed views revealed no abnormalities. The time to cecum=1 minutes 11 seconds.  Withdrawal time=10  minutes 34 seconds.  The scope was withdrawn and the procedure completed. COMPLICATIONS: There were no immediate complications.  ENDOSCOPIC IMPRESSION: 1.   Sessile polyp was found in the distal transverse colon; polypectomy was performed with a cold snare 2.   Moderate diverticulosis was noted in the descending colon 3.   Sessile polyp was found in the distal sigmoid colon; polypectomy was performed with cold forceps 4.   The examination was otherwise normal  RECOMMENDATIONS: If the polyp(s) removed today are proven to be adenomatous (pre-cancerous) polyps, you will need a repeat colonoscopy in 5 years.  Otherwise you should continue to follow colorectal cancer screening guidelines for "routine risk" patients with colonoscopy in 10 years.  You will receive a letter within 1-2 weeks with the results of your biopsy as well as final recommendations.  Please call my office if you have not received a letter after 3 weeks.  eSigned:  Inda Castle, MD 12/20/2014 3:27 PM   cc:   PATIENT NAME:  Richard Vaughn, Richard Vaughn MR#: [id number]

## 2014-12-20 NOTE — Patient Instructions (Signed)
Discharge instructions given. Handouts on polyps and diverticulosis. Resume previous medications. YOU HAD AN ENDOSCOPIC PROCEDURE TODAY AT THE [facility]: Refer to the procedure report that was given to you for any specific questions about what was found during the examination.  If the procedure report does not answer your questions, please call your gastroenterologist to clarify.  If you requested that your care partner not be given the details of your procedure findings, then the procedure report has been included in a sealed envelope for you to review at your convenience later.  YOU SHOULD EXPECT: Some feelings of bloating in the abdomen. Passage of more gas than usual.  Walking can help get rid of the air that was put into your GI tract during the procedure and reduce the bloating. If you had a lower endoscopy (such as a colonoscopy or flexible sigmoidoscopy) you may notice spotting of blood in your stool or on the toilet paper. If you underwent a bowel prep for your procedure, then you may not have a normal bowel movement for a few days.  DIET: Your first meal following the procedure should be a light meal and then it is ok to progress to your normal diet.  A half-sandwich or bowl of soup is an example of a good first meal.  Heavy or fried foods are harder to digest and may make you feel nauseous or bloated.  Likewise meals heavy in dairy and vegetables can cause extra gas to form and this can also increase the bloating.  Drink plenty of fluids but you should avoid alcoholic beverages for 24 hours.  ACTIVITY: Your care partner should take you home directly after the procedure.  You should plan to take it easy, moving slowly for the rest of the day.  You can resume normal activity the day after the procedure however you should NOT DRIVE or use heavy machinery for 24 hours (because of the sedation medicines used during the test).    SYMPTOMS TO REPORT IMMEDIATELY: A gastroenterologist  can be reached at any hour.  During normal business hours, 8:30 AM to 5:00 PM Monday through Friday, call [phone].  After hours and on weekends, please call the GI answering service at [phone] who will take a message and have the physician on call contact you.   Following lower endoscopy (colonoscopy or flexible sigmoidoscopy):  Excessive amounts of blood in the stool  Significant tenderness or worsening of abdominal pains  Swelling of the abdomen that is new, acute  Fever of 100F or higher  FOLLOW UP: If any biopsies were taken you will be contacted by phone or by letter within the next 1-3 weeks.  Call your gastroenterologist if you have not heard about the biopsies in 3 weeks.  Our staff will call the home number listed on your records the next business day following your procedure to check on you and address any questions or concerns that you may have at that time regarding the information given to you following your procedure. This is a courtesy call and so if there is no answer at the home number and we have not heard from you through the emergency physician on call, we will assume that you have returned to your regular daily activities without incident.  SIGNATURES/CONFIDENTIALITY: You and/or your care partner have signed paperwork which will be entered into your electronic medical record.  These signatures attest to the fact that that the information above on your After Visit Summary has been reviewed   and is understood.  Full responsibility of the confidentiality of this discharge information lies with you and/or your care-partner. 

## 2014-12-20 NOTE — Progress Notes (Signed)
Report to PACU, RN, vss, BBS= Clear.  

## 2014-12-20 NOTE — Progress Notes (Signed)
Called to room to assist during endoscopic procedure.  Patient ID and intended procedure confirmed with present staff. Received instructions for my participation in the procedure from the performing physician.  

## 2014-12-23 ENCOUNTER — Telehealth: Payer: Self-pay

## 2014-12-23 NOTE — Telephone Encounter (Signed)
Left message on answering machine. 

## 2014-12-27 ENCOUNTER — Encounter: Payer: Self-pay | Admitting: Gastroenterology

## 2014-12-27 ENCOUNTER — Encounter: Payer: Self-pay | Admitting: Internal Medicine

## 2014-12-27 ENCOUNTER — Ambulatory Visit (INDEPENDENT_AMBULATORY_CARE_PROVIDER_SITE_OTHER): Payer: Federal, State, Local not specified - PPO | Admitting: Internal Medicine

## 2014-12-27 ENCOUNTER — Telehealth: Payer: Self-pay | Admitting: Internal Medicine

## 2014-12-27 VITALS — BP 130/80 | HR 90 | Temp 97.6°F | Wt 277.0 lb

## 2014-12-27 DIAGNOSIS — Z0000 Encounter for general adult medical examination without abnormal findings: Secondary | ICD-10-CM

## 2014-12-27 DIAGNOSIS — I10 Essential (primary) hypertension: Secondary | ICD-10-CM

## 2014-12-27 DIAGNOSIS — I674 Hypertensive encephalopathy: Secondary | ICD-10-CM | POA: Insufficient documentation

## 2014-12-27 DIAGNOSIS — Z23 Encounter for immunization: Secondary | ICD-10-CM

## 2014-12-27 MED ORDER — LOSARTAN POTASSIUM 25 MG PO TABS: ORAL_TABLET | ORAL | Status: DC

## 2014-12-27 MED ORDER — PRAVASTATIN SODIUM 40 MG PO TABS: 40.0000 mg | ORAL_TABLET | Freq: Every day | ORAL | Status: DC

## 2014-12-27 NOTE — Telephone Encounter (Signed)
emmi emailed °

## 2014-12-27 NOTE — Progress Notes (Signed)
Subjective:    Patient ID: Richard Vaughn, male    DOB: 05/24/1951, 64 y.o.   MRN: [id number]  HPI  64 year old African-American male with history of hypertension, GERD and gastroparesis secondary to chronic pain medication use for routine follow-up. Interval medical history-patient followed by gastroenterologist for gastroparesis. He is working with his pain management specialist to gradually decrease his narcotic use.   Hypertension-stable. Patient never started pravastatin as directed.  Patient smoking approximately half pack per day. He is pre-contemplative in terms of smoking cessation.  Review of Systems Negative for chest pain or shortness of breath    Past Medical History  Diagnosis Date  . GERD (gastroesophageal reflux disease)   . Stricture and stenosis of esophagus   . Gastritis     Mild  . Barrett's esophagus   . Depression   . Gastroparesis   . Hyperglycemia   . Renal cyst   . Nephrolithiasis   . Chronic back pain     History   Social History  . Marital Status: Married    Spouse Name: N/A  . Number of Children: 3  . Years of Education: N/A   Occupational History  . retired [employer]   Social History Main Topics  . Smoking status: Current Every Day Smoker -- 0.80 packs/day    Types: Cigarettes  . Smokeless tobacco: Never Used     Comment: Counseling sheet given 3/15  . Alcohol Use: No  . Drug Use: No  . Sexual Activity: Not on file   Other Topics Concern  . Not on file   Social History Narrative    Past Surgical History  Procedure Laterality Date  . Lumbar disc surgery  2005, 2010    replaced L4 and L5  . Cervical disc surgery  2012    Family History  Problem Relation Age of Onset  . Hypertension Father   . Stroke Father   . Hypertension Mother   . Liver cancer Mother   . Hypertension Sister     2 other sisters has also  . Hypertension Brother     2nd brother has also  . Colon cancer Neg Hx   . Esophageal cancer Neg Hx     . Rectal cancer Neg Hx   . Stomach cancer Neg Hx     No Known Allergies  Current Outpatient Prescriptions on File Prior to Visit  Medication Sig Dispense Refill  . metoCLOPramide (REGLAN) 5 MG tablet TAKE ONE TABLET BY MOUTH BEFORE MEALS AND AT BEDTIME (Patient taking differently: TAKE ONE TABLET BY MOUTH BEFORE MEALS AND AT BEDTIME as needed) 120 tablet 1  . NEXIUM 40 MG capsule Take 1 capsule (40 mg total) by mouth daily. 30 capsule 0  . ondansetron (ZOFRAN) 8 MG tablet Take 1 tablet by mouth as needed.   5  . oxymorphone (OPANA ER) 20 MG 12 hr tablet Take 20 mg by mouth daily.    Marland Kitchen oxymorphone (OPANA) 10 MG tablet Take 10 mg by mouth as needed for pain (before 1800 each day).      No current facility-administered medications on file prior to visit.    BP 130/80 mmHg  Pulse 90  Temp(Src) 97.6 F (36.4 C) (Oral)  Wt 277 lb (125.646 kg)    Objective:   Physical Exam  Constitutional: He is oriented to person, place, and time. He appears well-developed and well-nourished. No distress.  HENT:  Head: Normocephalic.  Cardiovascular: Normal rate, regular rhythm and normal heart sounds.  No murmur heard. Pulmonary/Chest: Effort normal and breath sounds normal. He has no wheezes.  Musculoskeletal: He exhibits no edema.  Neurological: He is alert and oriented to person, place, and time. No cranial nerve deficit.  Skin: Skin is warm and dry.  Psychiatric: He has a normal mood and affect. His behavior is normal.          Assessment & Plan:

## 2014-12-27 NOTE — Assessment & Plan Note (Signed)
BP stable.  Continue Losartan 25 mg.  Monitor electrolytes and renal function before next office visit.  Patient encouraged to start pravastatin.  Smoking cessation strongly encouraged.  Contact information for smoking cessation classes provided.

## 2014-12-27 NOTE — Progress Notes (Signed)
Pre visit review using our clinic review tool, if applicable. No additional management support is needed unless otherwise documented below in the visit note. 

## 2014-12-27 NOTE — Patient Instructions (Signed)
Please complete the following lab tests before your next follow up appointment: CPX labs including PSA

## 2015-03-20 ENCOUNTER — Other Ambulatory Visit (INDEPENDENT_AMBULATORY_CARE_PROVIDER_SITE_OTHER): Payer: Federal, State, Local not specified - PPO

## 2015-03-20 DIAGNOSIS — Z0000 Encounter for general adult medical examination without abnormal findings: Secondary | ICD-10-CM

## 2015-03-20 LAB — CBC WITH DIFFERENTIAL/PLATELET
BASOS ABS: 0 10*3/uL (ref 0.0–0.1)
Basophils Relative: 0.3 % (ref 0.0–3.0)
EOS ABS: 0.1 10*3/uL (ref 0.0–0.7)
Eosinophils Relative: 0.9 % (ref 0.0–5.0)
HCT: 46.4 % (ref 39.0–52.0)
HEMOGLOBIN: 15.9 g/dL (ref 13.0–17.0)
LYMPHS PCT: 28.5 % (ref 12.0–46.0)
Lymphs Abs: 2.6 10*3/uL (ref 0.7–4.0)
MCHC: 34.2 g/dL (ref 30.0–36.0)
MCV: 91.3 fl (ref 78.0–100.0)
MONO ABS: 0.7 10*3/uL (ref 0.1–1.0)
Monocytes Relative: 7.5 % (ref 3.0–12.0)
Neutro Abs: 5.8 10*3/uL (ref 1.4–7.7)
Neutrophils Relative %: 62.8 % (ref 43.0–77.0)
PLATELETS: 198 10*3/uL (ref 150.0–400.0)
RBC: 5.08 Mil/uL (ref 4.22–5.81)
RDW: 14.2 % (ref 11.5–15.5)
WBC: 9.3 10*3/uL (ref 4.0–10.5)

## 2015-03-20 LAB — HEPATIC FUNCTION PANEL
ALBUMIN: 4.2 g/dL (ref 3.5–5.2)
ALK PHOS: 86 U/L (ref 39–117)
ALT: 23 U/L (ref 0–53)
AST: 14 U/L (ref 0–37)
Bilirubin, Direct: 0.2 mg/dL (ref 0.0–0.3)
TOTAL PROTEIN: 7.1 g/dL (ref 6.0–8.3)
Total Bilirubin: 0.7 mg/dL (ref 0.2–1.2)

## 2015-03-20 LAB — BASIC METABOLIC PANEL
BUN: 15 mg/dL (ref 6–23)
CALCIUM: 9.6 mg/dL (ref 8.4–10.5)
CO2: 27 mEq/L (ref 19–32)
CREATININE: 1.11 mg/dL (ref 0.40–1.50)
Chloride: 104 mEq/L (ref 96–112)
GFR: 86.5 mL/min (ref >60.00)
Glucose, Bld: 89 mg/dL (ref 70–99)
Potassium: 4.6 mEq/L (ref 3.5–5.1)
Sodium: 139 mEq/L (ref 135–145)

## 2015-03-20 LAB — LIPID PANEL
Cholesterol: 152 mg/dL (ref 0–200)
HDL: 34.1 mg/dL — AB (ref >39.00)
LDL Cholesterol: 97 mg/dL (ref 0–99)
NONHDL: 117.9
Total CHOL/HDL Ratio: 4
Triglycerides: 103 mg/dL (ref 0.0–149.0)
VLDL: 20.6 mg/dL (ref 0.0–40.0)

## 2015-03-20 LAB — PSA: PSA: 0.35 ng/mL (ref 0.10–4.00)

## 2015-03-20 LAB — TSH: TSH: 1.22 u[IU]/mL (ref 0.35–4.50)

## 2015-03-28 ENCOUNTER — Encounter: Payer: Self-pay | Admitting: Gastroenterology

## 2015-03-28 ENCOUNTER — Encounter: Payer: Federal, State, Local not specified - PPO | Admitting: Internal Medicine

## 2015-04-21 ENCOUNTER — Encounter: Payer: Federal, State, Local not specified - PPO | Admitting: Internal Medicine

## 2015-04-22 ENCOUNTER — Encounter: Payer: Self-pay | Admitting: Adult Health

## 2015-04-22 ENCOUNTER — Ambulatory Visit (INDEPENDENT_AMBULATORY_CARE_PROVIDER_SITE_OTHER): Payer: Federal, State, Local not specified - PPO | Admitting: Adult Health

## 2015-04-22 VITALS — Temp 97.8°F | Ht 74.75 in | Wt 276.6 lb

## 2015-04-22 DIAGNOSIS — Z720 Tobacco use: Secondary | ICD-10-CM | POA: Diagnosis not present

## 2015-04-22 DIAGNOSIS — F17200 Nicotine dependence, unspecified, uncomplicated: Secondary | ICD-10-CM

## 2015-04-22 DIAGNOSIS — Z79899 Other long term (current) drug therapy: Secondary | ICD-10-CM | POA: Diagnosis not present

## 2015-04-22 DIAGNOSIS — B351 Tinea unguium: Secondary | ICD-10-CM | POA: Diagnosis not present

## 2015-04-22 DIAGNOSIS — Z0000 Encounter for general adult medical examination without abnormal findings: Secondary | ICD-10-CM | POA: Diagnosis not present

## 2015-04-22 LAB — POCT URINALYSIS DIPSTICK
BILIRUBIN UA: NEGATIVE
Glucose, UA: NEGATIVE
Ketones, UA: NEGATIVE
Leukocytes, UA: NEGATIVE
Nitrite, UA: NEGATIVE
PH UA: 5.5
Protein, UA: NEGATIVE
Urobilinogen, UA: 0.2

## 2015-04-22 MED ORDER — VARENICLINE TARTRATE 0.5 MG X 11 & 1 MG X 42 PO MISC: ORAL | Status: DC

## 2015-04-22 MED ORDER — TERBINAFINE HCL 250 MG PO TABS: 250.0000 mg | ORAL_TABLET | Freq: Every day | ORAL | Status: DC

## 2015-04-22 MED ORDER — VARENICLINE TARTRATE 1 MG PO TABS
1.0000 mg | ORAL_TABLET | Freq: Two times a day (BID) | ORAL | Status: DC
Start: 2015-04-22 — End: 2015-08-04

## 2015-04-22 NOTE — Patient Instructions (Signed)
Please pick a quit date and stop smoking while taking Chantix.   Please make an appointment at the lab in four weeks so that we can check your liver while taking the oral antifungal. Stop the medication if you develop any rash.   Continue to exercise and eat a heart healthy diet.   It was great meeting you today. Let me know if you need anything

## 2015-04-22 NOTE — Progress Notes (Signed)
Pre visit review using our clinic review tool, if applicable. No additional management support is needed unless otherwise documented below in the visit note. 

## 2015-04-22 NOTE — Progress Notes (Signed)
Subjective:    Patient ID: Richard Vaughn, male    DOB: 04-01-1951, 64 y.o.   MRN: [id number]  HPI 63 year old African American male with history of chronic back pain, gastroparesis and gastroesophageal reflux disease for routine physical. He denies any significant interval history.   He is going to water aerobics three times a week.   Diet continues to improve.  Does not endorse any interval history.    He is still smoking and is reluctant to consider smoking cessation. He has  tried Chantix in the past - was not compliant with the past prescription. He took it on and off. He would like to try again.   We reviewed his screening blood work in detail.   Review of Systems  Constitutional: Negative.   HENT: Negative.   Eyes: Negative.   Respiratory: Negative.   Cardiovascular: Negative.   Gastrointestinal: Negative.   Endocrine: Negative.   Genitourinary: Negative.   Musculoskeletal: Positive for myalgias, back pain and arthralgias.  Skin: Negative.        Toe nail fungus  Allergic/Immunologic: Negative.   Neurological: Negative.   Hematological: Negative.   Psychiatric/Behavioral: Negative.   All other systems reviewed and are negative.  Past Medical History  Diagnosis Date  . GERD (gastroesophageal reflux disease)   . Stricture and stenosis of esophagus   . Gastritis     Mild  . Barrett's esophagus   . Depression   . Gastroparesis   . Hyperglycemia   . Renal cyst   . Nephrolithiasis   . Chronic back pain     History   Social History  . Marital Status: Married    Spouse Name: N/A  . Number of Children: 3  . Years of Education: N/A   Occupational History  . retired [employer]   Social History Main Topics  . Smoking status: Current Every Day Smoker -- 0.80 packs/day    Types: Cigarettes  . Smokeless tobacco: Never Used     Comment: Counseling sheet given 3/15  . Alcohol Use: No  . Drug Use: No  . Sexual Activity: Not on file   Other Topics  Concern  . Not on file   Social History Narrative    Past Surgical History  Procedure Laterality Date  . Lumbar disc surgery  2005, 2010    replaced L4 and L5  . Cervical disc surgery  2012    Family History  Problem Relation Age of Onset  . Hypertension Father   . Stroke Father   . Hypertension Mother   . Liver cancer Mother   . Hypertension Sister     2 other sisters has also  . Hypertension Brother     2nd brother has also  . Colon cancer Neg Hx   . Esophageal cancer Neg Hx   . Rectal cancer Neg Hx   . Stomach cancer Neg Hx     No Known Allergies  Current Outpatient Prescriptions on File Prior to Visit  Medication Sig Dispense Refill  . losartan (COZAAR) 25 MG tablet TAKE 1 TABLET (25 MG TOTAL) BY MOUTH DAILY. 90 tablet 1  . NEXIUM 40 MG capsule Take 1 capsule (40 mg total) by mouth daily. 30 capsule 0  . ondansetron (ZOFRAN) 8 MG tablet Take 1 tablet by mouth as needed.   5  . oxymorphone (OPANA ER) 20 MG 12 hr tablet Take 20 mg by mouth daily.    Marland Kitchen oxymorphone (OPANA) 10 MG tablet Take 10 mg  by mouth as needed for pain (before 1800 each day).     . pravastatin (PRAVACHOL) 40 MG tablet Take 1 tablet (40 mg total) by mouth daily. (Patient not taking: Reported on 04/22/2015) 90 tablet 1   No current facility-administered medications on file prior to visit.    Temp(Src) 97.8 F (36.6 C) (Oral)  Ht 6' 2.75" (1.899 m)  Wt 276 lb 9.6 oz (125.465 kg)  BMI 34.79 kg/m2        Objective:   Physical Exam  Constitutional: He is oriented to person, place, and time. He appears well-developed and well-nourished. No distress.  HENT:  Head: Normocephalic and atraumatic.  Right Ear: External ear normal.  Left Ear: External ear normal.  Nose: Nose normal.  Mouth/Throat: Oropharynx is clear and moist. No oropharyngeal exudate.  TM visualized in bilateral ear canal.   Poor dentition.  Eyes: Conjunctivae and EOM are normal. Pupils are equal, round, and reactive to light.  Right eye exhibits no discharge. Left eye exhibits no discharge. No scleral icterus.  Wearing glasses  Neck: No thyromegaly present.  Cardiovascular: Normal rate, regular rhythm, normal heart sounds and intact distal pulses.  Exam reveals no gallop and no friction rub.   No murmur heard. Pulmonary/Chest: Effort normal and breath sounds normal. No respiratory distress. He has no wheezes. He has no rales. He exhibits no tenderness.  Abdominal: Soft. Bowel sounds are normal. He exhibits no distension and no mass. There is no tenderness. There is no rebound and no guarding.  Genitourinary:  Deferred  Musculoskeletal: Normal range of motion. He exhibits no edema or tenderness.  Lymphadenopathy:    He has no cervical adenopathy.  Neurological: He is alert and oriented to person, place, and time. He has normal reflexes.  Skin: Skin is warm and dry. He is not diaphoretic.  Small scar on cervical spine and lumbar spine from prior surgeries. Dark Keratosis spot on left clavicle and right neck.   Has severe toe nail fungus infection on bilateral big toes. Mild fungal infection on the rest of his toes.   Psychiatric: He has a normal mood and affect. His behavior is normal. Judgment and thought content normal.  Nursing note and vitals reviewed.     Assessment & Plan:   1. Onychomycosis - terbinafine (LAMISIL) 250 MG tablet; Take 1 tablet (250 mg total) by mouth daily.  Dispense: 30 tablet; Refill: 3 - Hepatic function panel - Discontinue medication with any signs or symptoms of rash  2. Medication management - Hepatic function panel due to potential for hepatic toxicity from Lamisil.   3. Routine general medical examination at a health care facility - Follow up in one year for CPE or sooner if needed.  - Work on weight reduction.  - Continue to exercise and eat a heart healthy diet.  - Needs to quit smoking.   4. Tobacco use disorder - varenicline (CHANTIX CONTINUING MONTH PAK) 1 MG tablet;  Take 1 tablet (1 mg total) by mouth 2 (two) times daily.  Dispense: 60 tablet; Refill: 0 - varenicline (CHANTIX STARTING MONTH PAK) 0.5 MG X 11 & 1 MG X 42 tablet; Take one 0.5 mg tablet by mouth once daily for 3 days, then increase to one 0.5 mg tablet twice daily for 4 days, then increase to one 1 mg tablet twice daily.  Dispense: 53 tablet; Refill: 0 -Trial of chantix. Common side effects including rare risk of suicide ideation was discussed with the patient today.  Patient is instructed  to go directly to the ED if this occurs.  We discussed that patient can continue to smoke for 1 week after starting chantix, but then must discontinue cigarettes.  He is also instructed to contact us prior to completion of the starter month pack for an rx for the continuation month pack.  5 minutes spent with patient today on tobacco cessation counseling.

## 2015-05-26 ENCOUNTER — Other Ambulatory Visit: Payer: Federal, State, Local not specified - PPO

## 2015-06-19 ENCOUNTER — Other Ambulatory Visit: Payer: Self-pay

## 2015-06-25 ENCOUNTER — Other Ambulatory Visit: Payer: Self-pay | Admitting: Internal Medicine

## 2015-07-03 ENCOUNTER — Other Ambulatory Visit: Payer: Self-pay | Admitting: *Deleted

## 2015-07-03 MED ORDER — NEXIUM 40 MG PO CPDR: 40.0000 mg | DELAYED_RELEASE_CAPSULE | Freq: Every day | ORAL | Status: DC

## 2015-07-14 ENCOUNTER — Other Ambulatory Visit: Payer: Self-pay

## 2015-07-17 DIAGNOSIS — I639 Cerebral infarction, unspecified: Secondary | ICD-10-CM

## 2015-07-17 HISTORY — DX: Cerebral infarction, unspecified: I63.9

## 2015-08-02 ENCOUNTER — Emergency Department (INDEPENDENT_AMBULATORY_CARE_PROVIDER_SITE_OTHER)
Admission: EM | Admit: 2015-08-02 | Discharge: 2015-08-02 | Disposition: A | Discharge status: Nursing Home (Includes ICF) | Payer: Federal, State, Local not specified - PPO | Source: Home / Self Care

## 2015-08-02 ENCOUNTER — Inpatient Hospital Stay (HOSPITAL_COMMUNITY): Payer: Federal, State, Local not specified - PPO

## 2015-08-02 ENCOUNTER — Emergency Department (HOSPITAL_COMMUNITY): Payer: Federal, State, Local not specified - PPO

## 2015-08-02 ENCOUNTER — Encounter (HOSPITAL_COMMUNITY): Payer: Self-pay | Admitting: *Deleted

## 2015-08-02 ENCOUNTER — Inpatient Hospital Stay (HOSPITAL_COMMUNITY)
Admission: EM | Admit: 2015-08-02 | Discharge: 2015-08-04 | DRG: 065 | Disposition: A | Discharge status: Home / Self Care | Payer: Federal, State, Local not specified - PPO | Attending: Internal Medicine | Admitting: Internal Medicine

## 2015-08-02 DIAGNOSIS — K219 Gastro-esophageal reflux disease without esophagitis: Secondary | ICD-10-CM | POA: Diagnosis present

## 2015-08-02 DIAGNOSIS — Z79899 Other long term (current) drug therapy: Secondary | ICD-10-CM

## 2015-08-02 DIAGNOSIS — I6789 Other cerebrovascular disease: Secondary | ICD-10-CM | POA: Diagnosis not present

## 2015-08-02 DIAGNOSIS — I2510 Atherosclerotic heart disease of native coronary artery without angina pectoris: Secondary | ICD-10-CM | POA: Diagnosis present

## 2015-08-02 DIAGNOSIS — R479 Unspecified speech disturbances: Secondary | ICD-10-CM | POA: Diagnosis present

## 2015-08-02 DIAGNOSIS — G8191 Hemiplegia, unspecified affecting right dominant side: Secondary | ICD-10-CM | POA: Diagnosis present

## 2015-08-02 DIAGNOSIS — I674 Hypertensive encephalopathy: Secondary | ICD-10-CM | POA: Diagnosis present

## 2015-08-02 DIAGNOSIS — Z9114 Patient's other noncompliance with medication regimen: Secondary | ICD-10-CM | POA: Diagnosis present

## 2015-08-02 DIAGNOSIS — F17200 Nicotine dependence, unspecified, uncomplicated: Secondary | ICD-10-CM | POA: Diagnosis present

## 2015-08-02 DIAGNOSIS — R471 Dysarthria and anarthria: Secondary | ICD-10-CM | POA: Diagnosis present

## 2015-08-02 DIAGNOSIS — I639 Cerebral infarction, unspecified: Secondary | ICD-10-CM | POA: Diagnosis not present

## 2015-08-02 DIAGNOSIS — K222 Esophageal obstruction: Secondary | ICD-10-CM

## 2015-08-02 DIAGNOSIS — R739 Hyperglycemia, unspecified: Secondary | ICD-10-CM | POA: Diagnosis present

## 2015-08-02 DIAGNOSIS — F17210 Nicotine dependence, cigarettes, uncomplicated: Secondary | ICD-10-CM | POA: Diagnosis present

## 2015-08-02 DIAGNOSIS — M545 Low back pain: Secondary | ICD-10-CM | POA: Diagnosis present

## 2015-08-02 DIAGNOSIS — E785 Hyperlipidemia, unspecified: Secondary | ICD-10-CM | POA: Diagnosis present

## 2015-08-02 DIAGNOSIS — G451 Carotid artery syndrome (hemispheric): Secondary | ICD-10-CM

## 2015-08-02 DIAGNOSIS — G459 Transient cerebral ischemic attack, unspecified: Secondary | ICD-10-CM | POA: Diagnosis not present

## 2015-08-02 DIAGNOSIS — I10 Essential (primary) hypertension: Secondary | ICD-10-CM | POA: Diagnosis present

## 2015-08-02 DIAGNOSIS — G8929 Other chronic pain: Secondary | ICD-10-CM | POA: Diagnosis present

## 2015-08-02 DIAGNOSIS — Z79891 Long term (current) use of opiate analgesic: Secondary | ICD-10-CM | POA: Diagnosis not present

## 2015-08-02 DIAGNOSIS — K3184 Gastroparesis: Secondary | ICD-10-CM | POA: Diagnosis present

## 2015-08-02 DIAGNOSIS — I6340 Cerebral infarction due to embolism of unspecified cerebral artery: Principal | ICD-10-CM | POA: Diagnosis present

## 2015-08-02 DIAGNOSIS — R7309 Other abnormal glucose: Secondary | ICD-10-CM | POA: Diagnosis not present

## 2015-08-02 LAB — COMPREHENSIVE METABOLIC PANEL
ALK PHOS: 78 U/L (ref 38–126)
ALT: 22 U/L (ref 17–63)
ANION GAP: 5 (ref 5–15)
AST: 19 U/L (ref 15–41)
Albumin: 4.1 g/dL (ref 3.5–5.0)
BUN: 7 mg/dL (ref 6–20)
CALCIUM: 9.3 mg/dL (ref 8.9–10.3)
CO2: 28 mmol/L (ref 22–32)
CREATININE: 1.12 mg/dL (ref 0.61–1.24)
Chloride: 105 mmol/L (ref 101–111)
Glucose, Bld: 127 mg/dL — ABNORMAL HIGH (ref 65–99)
Potassium: 4.3 mmol/L (ref 3.5–5.1)
SODIUM: 138 mmol/L (ref 135–145)
TOTAL PROTEIN: 7.1 g/dL (ref 6.5–8.1)
Total Bilirubin: 0.9 mg/dL (ref 0.3–1.2)

## 2015-08-02 LAB — DIFFERENTIAL
Basophils Absolute: 0 10*3/uL (ref 0.0–0.1)
Basophils Relative: 0 %
EOS PCT: 1 %
Eosinophils Absolute: 0 10*3/uL (ref 0.0–0.7)
LYMPHS PCT: 29 %
Lymphs Abs: 2 10*3/uL (ref 0.7–4.0)
MONO ABS: 0.4 10*3/uL (ref 0.1–1.0)
MONOS PCT: 6 %
NEUTROS ABS: 4.4 10*3/uL (ref 1.7–7.7)
Neutrophils Relative %: 64 %

## 2015-08-02 LAB — URINALYSIS, ROUTINE W REFLEX MICROSCOPIC
BILIRUBIN URINE: NEGATIVE
GLUCOSE, UA: NEGATIVE mg/dL
KETONES UR: NEGATIVE mg/dL
LEUKOCYTES UA: NEGATIVE
Nitrite: NEGATIVE
PH: 6.5 (ref 5.0–8.0)
PROTEIN: NEGATIVE mg/dL
Specific Gravity, Urine: 1.009 (ref 1.005–1.030)
Urobilinogen, UA: 1 mg/dL (ref 0.0–1.0)

## 2015-08-02 LAB — CBC
HEMATOCRIT: 45.6 % (ref 39.0–52.0)
Hemoglobin: 16 g/dL (ref 13.0–17.0)
MCH: 32 pg (ref 26.0–34.0)
MCHC: 35.1 g/dL (ref 30.0–36.0)
MCV: 91.2 fL (ref 78.0–100.0)
PLATELETS: 180 10*3/uL (ref 150–400)
RBC: 5 MIL/uL (ref 4.22–5.81)
RDW: 13.2 % (ref 11.5–15.5)
WBC: 6.9 10*3/uL (ref 4.0–10.5)

## 2015-08-02 LAB — ETHANOL

## 2015-08-02 LAB — I-STAT TROPONIN, ED: TROPONIN I, POC: 0 ng/mL (ref 0.00–0.08)

## 2015-08-02 LAB — APTT: aPTT: 33 seconds (ref 24–37)

## 2015-08-02 LAB — I-STAT CHEM 8, ED
BUN: 8 mg/dL (ref 6–20)
CALCIUM ION: 1.19 mmol/L (ref 1.13–1.30)
Chloride: 103 mmol/L (ref 101–111)
Creatinine, Ser: 1.1 mg/dL (ref 0.61–1.24)
GLUCOSE: 117 mg/dL — AB (ref 65–99)
HCT: 50 % (ref 39.0–52.0)
Hemoglobin: 17 g/dL (ref 13.0–17.0)
Potassium: 4.2 mmol/L (ref 3.5–5.1)
SODIUM: 141 mmol/L (ref 135–145)
TCO2: 24 mmol/L (ref 0–100)

## 2015-08-02 LAB — PROTIME-INR
INR: 1.11 (ref 0.00–1.49)
PROTHROMBIN TIME: 14.5 s (ref 11.6–15.2)

## 2015-08-02 LAB — CBG MONITORING, ED: GLUCOSE-CAPILLARY: 102 mg/dL — AB (ref 65–99)

## 2015-08-02 LAB — URINE MICROSCOPIC-ADD ON

## 2015-08-02 LAB — I-STAT CG4 LACTIC ACID, ED: LACTIC ACID, VENOUS: 1.34 mmol/L (ref 0.5–2.0)

## 2015-08-02 MED ORDER — STROKE: EARLY STAGES OF RECOVERY BOOK
Freq: Once | Status: AC
  Administered 2015-08-02: 21:00:00
  Filled 2015-08-02: qty 1

## 2015-08-02 MED ORDER — ENOXAPARIN SODIUM 40 MG/0.4ML ~~LOC~~ SOLN
40.0000 mg | SUBCUTANEOUS | Status: DC
  Administered 2015-08-02 – 2015-08-03 (×2): 40 mg via SUBCUTANEOUS
  Filled 2015-08-02 (×2): qty 0.4

## 2015-08-02 MED ORDER — ASPIRIN 300 MG RE SUPP: 300.0000 mg | Freq: Every day | RECTAL | Status: DC

## 2015-08-02 MED ORDER — ASPIRIN 325 MG PO TABS
325.0000 mg | ORAL_TABLET | Freq: Every day | ORAL | Status: DC
  Administered 2015-08-02 – 2015-08-04 (×3): 325 mg via ORAL
  Filled 2015-08-02 (×3): qty 1

## 2015-08-02 MED ORDER — SODIUM CHLORIDE 0.9 % IV SOLN
Freq: Once | INTRAVENOUS | Status: AC
  Administered 2015-08-02: 14:00:00 via INTRAVENOUS

## 2015-08-02 MED ORDER — HYDRALAZINE HCL 20 MG/ML IJ SOLN: 10.0000 mg | Freq: Four times a day (QID) | INTRAMUSCULAR | Status: DC | PRN

## 2015-08-02 MED ORDER — PANTOPRAZOLE SODIUM 40 MG PO TBEC
40.0000 mg | DELAYED_RELEASE_TABLET | Freq: Every day | ORAL | Status: DC
  Administered 2015-08-03 – 2015-08-04 (×2): 40 mg via ORAL
  Filled 2015-08-02 (×3): qty 1

## 2015-08-02 MED ORDER — NICOTINE 21 MG/24HR TD PT24
21.0000 mg | MEDICATED_PATCH | Freq: Every day | TRANSDERMAL | Status: DC
  Administered 2015-08-02 – 2015-08-04 (×3): 21 mg via TRANSDERMAL
  Filled 2015-08-02 (×3): qty 1

## 2015-08-02 MED ORDER — MORPHINE SULFATE ER 30 MG PO TBCR
60.0000 mg | EXTENDED_RELEASE_TABLET | Freq: Every day | ORAL | Status: DC
  Administered 2015-08-02 – 2015-08-04 (×3): 60 mg via ORAL
  Filled 2015-08-02 (×3): qty 2

## 2015-08-02 NOTE — ED Notes (Signed)
Patient transported to MRI 

## 2015-08-02 NOTE — ED Notes (Addendum)
Pt  Reports   episode  Of     slutrred  Speech  And  Weakness  r  Arm   Symptoms    Started  About 5  Hours         r leg       He  Ambulated  To  Room        He  Is  Awake  And  Alert  And  Oriented                   Slight  r  Sided light  headed

## 2015-08-02 NOTE — ED Notes (Signed)
Patient woke this AM at 0300 and noted difficulty speaking and right leg weakness.  Patient went to urgent care today at 1330, then transferred to [facility] ED

## 2015-08-02 NOTE — ED Notes (Signed)
Iv  Ns  tko  20  Angio  r  Arm   Cardiac     Monitor       Nasal  2  l  /  min

## 2015-08-02 NOTE — ED Notes (Signed)
Attempted report X1. Secretary stated to call back in 5 min.

## 2015-08-02 NOTE — ED Provider Notes (Signed)
CSN: [id number]     Arrival date & time 08/02/15  1357 History   First MD Initiated Contact with Patient 08/02/15 1400     Chief Complaint  Patient presents with  . Code Stroke    @EDPCLEARED @ (Consider location/radiation/quality/duration/timing/severity/associated sxs/prior Treatment) HPI Comments: Patient presents as code stroke from urgent care. Awoke this morning with weakness in his right arm, difficulty speaking and trouble with heavy tongue and numbness. Last seen normal approximately 8:30 this morning. Symptoms are getting better. There is a slight headache. No chest pain or shortness of breath. States history of orthopedic problems but denies any chronic medical conditions. Does not take any blood thinners. Denies dizziness or lightheadedness.  The history is provided by the patient and a relative. The history is limited by the condition of the patient.    Past Medical History  Diagnosis Date  . GERD (gastroesophageal reflux disease)   . Stricture and stenosis of esophagus   . Gastritis     Mild  . Barrett's esophagus   . Depression   . Gastroparesis   . Hyperglycemia   . Renal cyst   . Nephrolithiasis   . Chronic back pain    Past Surgical History  Procedure Laterality Date  . Lumbar disc surgery  2005, 2010    replaced L4 and L5  . Cervical disc surgery  2012   Family History  Problem Relation Age of Onset  . Hypertension Father   . Stroke Father   . Hypertension Mother   . Liver cancer Mother   . Hypertension Sister     2 other sisters has also  . Hypertension Brother     2nd brother has also  . Colon cancer Neg Hx   . Esophageal cancer Neg Hx   . Rectal cancer Neg Hx   . Stomach cancer Neg Hx    Social History  Substance Use Topics  . Smoking status: Current Every Day Smoker -- 0.80 packs/day    Types: Cigarettes  . Smokeless tobacco: Never Used     Comment: Counseling sheet given 3/15  . Alcohol Use: No    Review of Systems  Constitutional:  Negative for fever and activity change.  HENT: Negative for congestion.   Eyes: Negative for photophobia.  Respiratory: Negative for cough, chest tightness and shortness of breath.   Cardiovascular: Negative for chest pain.  Gastrointestinal: Negative for nausea, vomiting and abdominal pain.  Genitourinary: Negative for dysuria, hematuria and testicular pain.  Musculoskeletal: Negative for myalgias and arthralgias.  Skin: Negative for rash.  Neurological: Positive for speech difficulty, weakness, light-headedness and numbness. Negative for dizziness, seizures and facial asymmetry.  A complete 10 system review of systems was obtained and all systems are negative except as noted in the HPI and PMH.      Allergies  Review of patient's allergies indicates no known allergies.  Home Medications   Prior to Admission medications   Medication Sig Start Date End Date Taking? Authorizing Provider  losartan (COZAAR) 25 MG tablet TAKE 1 TABLET BY MOUTH EVERY DAY 06/25/15  Yes Doe-Hyun R Yoo, DO  NEXIUM 40 MG capsule Take 1 capsule (40 mg total) by mouth daily. 07/03/15  Yes Inda Castle, MD  ondansetron (ZOFRAN) 8 MG tablet Take 1 tablet by mouth as needed for nausea.  10/14/14  Yes Historical Provider, MD  oxymorphone (OPANA ER) 20 MG 12 hr tablet Take 20 mg by mouth daily.   Yes Historical Provider, MD  pravastatin (PRAVACHOL) 40 MG  tablet Take 1 tablet (40 mg total) by mouth daily. Patient not taking: Reported on 04/22/2015 12/27/14   Doe-Hyun R Shawna Orleans, DO  terbinafine (LAMISIL) 250 MG tablet Take 1 tablet (250 mg total) by mouth daily. Patient not taking: Reported on 08/02/2015 04/22/15   Dorothyann Peng, NP  varenicline (CHANTIX CONTINUING MONTH PAK) 1 MG tablet Take 1 tablet (1 mg total) by mouth 2 (two) times daily. Patient not taking: Reported on 08/02/2015 04/22/15   Dorothyann Peng, NP  varenicline (CHANTIX STARTING MONTH PAK) 0.5 MG X 11 & 1 MG X 42 tablet Take one 0.5 mg tablet by mouth once daily  for 3 days, then increase to one 0.5 mg tablet twice daily for 4 days, then increase to one 1 mg tablet twice daily. Patient not taking: Reported on 08/02/2015 04/22/15   Dorothyann Peng, NP   Temp(Src) 99.3 F (37.4 C) Physical Exam  Constitutional: He is oriented to person, place, and time. He appears well-developed and well-nourished. No distress.  HENT:  Head: Normocephalic and atraumatic.  Mouth/Throat: Oropharynx is clear and moist. No oropharyngeal exudate.  Eyes: Conjunctivae and EOM are normal. Pupils are equal, round, and reactive to light.  Neck: Normal range of motion. Neck supple.  No meningismus.  Cardiovascular: Normal rate, regular rhythm, normal heart sounds and intact distal pulses.   No murmur heard. Pulmonary/Chest: Effort normal and breath sounds normal. No respiratory distress.  Abdominal: Soft. There is no tenderness. There is no rebound and no guarding.  Musculoskeletal: Normal range of motion. He exhibits no edema or tenderness.  Neurological: He is alert and oriented to person, place, and time. No cranial nerve deficit. He exhibits normal muscle tone. Coordination normal.  No ataxia on finger to nose bilaterally. No pronator drift. 5/5 strength throughout. CN 2-12 intact. Equal grip strength. Sensation intact.  Mild dysarthria. Tongue midline. RLE drift.   Skin: Skin is warm.  Psychiatric: He has a normal mood and affect. His behavior is normal.  Nursing note and vitals reviewed.   ED Course  Procedures (including critical care time) Labs Review Labs Reviewed  COMPREHENSIVE METABOLIC PANEL - Abnormal; Notable for the following:    Glucose, Bld 127 (*)    All other components within normal limits  URINALYSIS, ROUTINE W REFLEX MICROSCOPIC (NOT AT [facility]) - Abnormal; Notable for the following:    Hgb urine dipstick TRACE (*)    All other components within normal limits  I-STAT CHEM 8, ED - Abnormal; Notable for the following:    Glucose, Bld 117 (*)    All other  components within normal limits  CBG MONITORING, ED - Abnormal; Notable for the following:    Glucose-Capillary 102 (*)    All other components within normal limits  ETHANOL  PROTIME-INR  APTT  CBC  DIFFERENTIAL  URINE MICROSCOPIC-ADD ON  I-STAT TROPOININ, ED  I-STAT CHEM 8, ED  I-STAT TROPOININ, ED  I-STAT CG4 LACTIC ACID, ED    Imaging Review Ct Head Wo Contrast  08/02/2015   CLINICAL DATA:  Right arm weakness.  Dysphagia.  EXAM: CT HEAD WITHOUT CONTRAST  TECHNIQUE: Contiguous axial images were obtained from the base of the skull through the vertex without intravenous contrast.  COMPARISON:  None.  FINDINGS: The ventricles are normal in size and configuration. There is a cavum septum pellucidum, an incidental developmental anomaly.  There are no parenchymal masses or mass effect. Well-defined hypoattenuation is noted extending from the left basal ganglia along the anterior limb of the left internal  capsule to the left centrum semiovale consistent with an old infarct. There is another probable old deep white matter infarct just above the anterior horn of the left lateral ventricle. Patchy areas of white matter hypoattenuation are noted bilaterally consistent with moderate chronic microvascular ischemic change.  There is no evidence of a recent cortical infarct.  There are no extra-axial masses or abnormal fluid collections.  There is no intracranial hemorrhage.  Visualized sinuses and mastoid air cells are clear.  IMPRESSION: 1. No acute intracranial abnormalities. 2. 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  These results were called by telephone at the time of interpretation on 08/02/2015 at 2:18 pm to Dr. Nicole Kindred, who verbally acknowledged these results.   Electronically Signed   By: Lajean Manes M.D.   On: 08/02/2015 14:18   Mr [name] Head Wo Contrast  08/02/2015   CLINICAL DATA:  Acute presentation with difficulty speaking and  right-sided weakness.  EXAM: MRI HEAD WITHOUT CONTRAST  MRA HEAD WITHOUT CONTRAST  TECHNIQUE: Multiplanar, multiecho pulse sequences of the brain and surrounding structures were obtained without intravenous contrast. Angiographic images of the head were obtained using MRA technique without contrast.  COMPARISON:  08/02/2015 CT  FINDINGS: MRI HEAD FINDINGS  Diffusion imaging shows acute infarction in the left basal ganglia and radiating white matter tracts. No other acute infarction.  There chronic small-vessel ischemic changes of the pons. No focal cerebellar insult. There are old small vessel infarctions affecting the thalami and basal ganglia and patchy and confluent chronic small vessel ischemic changes throughout the cerebral hemispheric white matter. No large vessel territory infarction. No mass lesion, acute hemorrhage, hydrocephalus or extra-axial collection. There is hemosiderin deposition associated with a few of the old white matter infarctions. No pituitary mass. No inflammatory sinus disease. There is a mastoid effusion on the left.  MRA HEAD FINDINGS  Both internal carotid arteries are widely patent into the brain. No siphon stenosis. The anterior and middle cerebral vessels are patent without proximal stenosis, aneurysm or vascular malformation.  Both vertebral arteries are patent with the right being dominant. No basilar stenosis. Posterior circulation branch vessels are patent.  IMPRESSION: Acute infarction in the left basal ganglia and radiating white matter tracts. No swelling or acute hemorrhage.  Chronic small vessel ischemic changes elsewhere throughout the brain.  Intracranial MR angiography of the large and medium size vessels is negative.   Electronically Signed   By: Nelson Chimes M.D.   On: 08/02/2015 16:56   Mr Brain Wo Contrast  08/02/2015   CLINICAL DATA:  Acute presentation with difficulty speaking and right-sided weakness.  EXAM: MRI HEAD WITHOUT CONTRAST  MRA HEAD WITHOUT CONTRAST   TECHNIQUE: Multiplanar, multiecho pulse sequences of the brain and surrounding structures were obtained without intravenous contrast. Angiographic images of the head were obtained using MRA technique without contrast.  COMPARISON:  08/02/2015 CT  FINDINGS: MRI HEAD FINDINGS  Diffusion imaging shows acute infarction in the left basal ganglia and radiating white matter tracts. No other acute infarction.  There chronic small-vessel ischemic changes of the pons. No focal cerebellar insult. There are old small vessel infarctions affecting the thalami and basal ganglia and patchy and confluent chronic small vessel ischemic changes throughout the cerebral hemispheric white matter. No large vessel territory infarction. No mass lesion, acute hemorrhage, hydrocephalus or extra-axial collection. There is hemosiderin deposition associated with a few of the old white matter infarctions. No pituitary mass. No inflammatory sinus disease. There  is a mastoid effusion on the left.  MRA HEAD FINDINGS  Both internal carotid arteries are widely patent into the brain. No siphon stenosis. The anterior and middle cerebral vessels are patent without proximal stenosis, aneurysm or vascular malformation.  Both vertebral arteries are patent with the right being dominant. No basilar stenosis. Posterior circulation branch vessels are patent.  IMPRESSION: Acute infarction in the left basal ganglia and radiating white matter tracts. No swelling or acute hemorrhage.  Chronic small vessel ischemic changes elsewhere throughout the brain.  Intracranial MR angiography of the large and medium size vessels is negative.   Electronically Signed   By: Nelson Chimes M.D.   On: 08/02/2015 16:56   I have personally reviewed and evaluated these images and lab results as part of my medical decision-making.   EKG Interpretation   Date/Time:  Saturday August 02 2015 14:18:13 EDT Ventricular Rate:  74 PR Interval:  143 QRS Duration: 102 QT Interval:   393 QTC Calculation: 436 R Axis:   55 Text Interpretation:  Sinus rhythm No significant change was found  Confirmed by Wyvonnia Dusky  MD, STEPHEN [phone]) on 08/02/2015 2:25:29 PM      MDM   Final diagnoses:  Dysarthria   Code stroke on arrival seen with Dr. Nicole Kindred. CT head negative for hemorrhage or acute infarct.  Low NIH scale, symptoms appear to be resolving.  Patient not TPA candidate per neurology due to the improving symptoms and minimal deficits. He also is presenting late.   Dr. Nicole Kindred recommends admission for stroke workup including MRI, echocardiogram and carotid Dopplers.  Admission d/w NP Lissa Merlin.   Ezequiel Essex, MD 08/02/15 [phone]

## 2015-08-02 NOTE — Consult Note (Signed)
Admission H&P    Chief Complaint: New onset speech output difficulty and right-sided weakness.  HPI: Richard Vaughn is an 64 y.o. male with a history of hypertension, Barrett's esophagus, GERD and chronic back pain, presenting with new onset speech output difficulty and mild right-sided weakness. Patient had word finding difficulty stuttering but no dysarthria. Patient was last known well at about 3:30 AM. When he woke up again at about 8:30 deficits were present. Patient is not aware of any previous stroke or TIA. 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 NIH stroke score was 1.  LSN: 3:30 AM on 08/02/2015 tPA Given: No: Minimal deficits and beyond time window for treatment consideration mRankin:  Past Medical History  Diagnosis Date  . GERD (gastroesophageal reflux disease)   . Stricture and stenosis of esophagus   . Gastritis     Mild  . Barrett's esophagus   . Depression   . Gastroparesis   . Hyperglycemia   . Renal cyst   . Nephrolithiasis   . Chronic back pain     Past Surgical History  Procedure Laterality Date  . Lumbar disc surgery  2005, 2010    replaced L4 and L5  . Cervical disc surgery  2012    Family History  Problem Relation Age of Onset  . Hypertension Father   . Stroke Father   . Hypertension Mother   . Liver cancer Mother   . Hypertension Sister     2 other sisters has also  . Hypertension Brother     2nd brother has also  . Colon cancer Neg Hx   . Esophageal cancer Neg Hx   . Rectal cancer Neg Hx   . Stomach cancer Neg Hx    Social History:  reports that he has been smoking Cigarettes.  He has been smoking about 0.80 packs per day. He has never used smokeless tobacco. He reports that he does not drink alcohol or use illicit drugs.  Allergies: No Known Allergies  Medications: Patient's preadmission medications were reviewed by me.  ROS: History obtained from spouse and the  patient.  General ROS: negative for - chills, fatigue, fever, night sweats, weight gain or weight loss Psychological ROS: negative for - behavioral disorder, hallucinations, memory difficulties, mood swings or suicidal ideation Ophthalmic ROS: negative for - blurry vision, double vision, eye pain or loss of vision ENT ROS: negative for - epistaxis, nasal discharge, oral lesions, sore throat, tinnitus or vertigo Allergy and Immunology ROS: negative for - hives or itchy/watery eyes Hematological and Lymphatic ROS: negative for - bleeding problems, bruising or swollen lymph nodes Endocrine ROS: negative for - galactorrhea, hair pattern changes, polydipsia/polyuria or temperature intolerance Respiratory ROS: negative for - cough, hemoptysis, shortness of breath or wheezing Cardiovascular ROS: negative for - chest pain, dyspnea on exertion, edema or irregular heartbeat Gastrointestinal ROS: negative for - abdominal pain, diarrhea, hematemesis, nausea/vomiting or stool incontinence Genito-Urinary ROS: negative for - dysuria, hematuria, incontinence or urinary frequency/urgency Musculoskeletal ROS: negative for - joint swelling or muscular weakness Neurological ROS: as noted in HPI Dermatological ROS: negative for rash and skin lesion changes  Physical Examination: There were no vitals taken for this visit.  HEENT-  Normocephalic, no lesions, without obvious abnormality.  Normal external eye and conjunctiva.  Normal TM's bilaterally.  Normal auditory canals and external ears. Normal external nose, mucus membranes and septum.  Normal pharynx. Neck supple with no masses, nodes, nodules or  enlargement. Cardiovascular - regular rate and rhythm, S1, S2 normal, no murmur, click, rub or gallop Lungs - chest clear, no wheezing, rales, normal symmetric air entry Abdomen - soft, non-tender; bowel sounds normal; no masses,  no organomegaly Extremities - no joint deformities, effusion, or inflammation, no  edema and no skin discoloration  Neurologic Examination: Mental Status: Alert, oriented, thought content appropriate.  Speech fluent without evidence of aphasia. Able to follow commands without difficulty. Cranial Nerves: II-Visual fields were normal. III/IV/VI-Pupils were equal and reacted to light. Extraocular movements were full and conjugate.    V/VII-no facial numbness and no facial weakness. VIII-normal. X-normal speech and symmetrical palatal movement. XI: trapezius strength/neck flexion strength normal bilaterally XII-midline tongue extension with normal strength. Motor: Mild drift of right lower extremity; motor exam otherwise unremarkable. Sensory: Normal throughout. Deep Tendon Reflexes: 2+ and symmetric. Plantars: Flexor bilaterally Cerebellar: Normal finger-to-nose testing.  Results for orders placed or performed during the hospital encounter of 08/02/15 (from the past 48 hour(s))  I-stat troponin, ED (not at [facility], [facility])     Status: None   Collection Time: 08/02/15  2:11 PM  Result Value Ref Range   Troponin i, poc 0.00 0.00 - 0.08 ng/mL   Comment 3            Comment: Due to the release kinetics of cTnI, a negative result within the first hours of the onset of symptoms does not rule out myocardial infarction with certainty. If myocardial infarction is still suspected, repeat the test at appropriate intervals.   I-Stat Chem 8, ED  (not at [facility], [facility])     Status: Abnormal   Collection Time: 08/02/15  2:13 PM  Result Value Ref Range   Sodium 141 135 - 145 mmol/L   Potassium 4.2 3.5 - 5.1 mmol/L   Chloride 103 101 - 111 mmol/L   BUN 8 6 - 20 mg/dL   Creatinine, Ser 1.10 0.61 - 1.24 mg/dL   Glucose, Bld 117 (H) 65 - 99 mg/dL   Calcium, Ion 1.19 1.13 - 1.30 mmol/L   TCO2 24 0 - 100 mmol/L   Hemoglobin 17.0 13.0 - 17.0 g/dL   HCT 50.0 39.0 - 52.0 %  CBC     Status: None   Collection Time: 08/02/15  2:13 PM  Result Value Ref Range   WBC 6.9 4.0 - 10.5 K/uL   RBC  5.00 4.22 - 5.81 MIL/uL   Hemoglobin 16.0 13.0 - 17.0 g/dL   HCT 45.6 39.0 - 52.0 %   MCV 91.2 78.0 - 100.0 fL   MCH 32.0 26.0 - 34.0 pg   MCHC 35.1 30.0 - 36.0 g/dL   RDW 13.2 11.5 - 15.5 %   Platelets 180 150 - 400 K/uL  Differential     Status: None   Collection Time: 08/02/15  2:13 PM  Result Value Ref Range   Neutrophils Relative % 64 %   Neutro Abs 4.4 1.7 - 7.7 K/uL   Lymphocytes Relative 29 %   Lymphs Abs 2.0 0.7 - 4.0 K/uL   Monocytes Relative 6 %   Monocytes Absolute 0.4 0.1 - 1.0 K/uL   Eosinophils Relative 1 %   Eosinophils Absolute 0.0 0.0 - 0.7 K/uL   Basophils Relative 0 %   Basophils Absolute 0.0 0.0 - 0.1 K/uL  I-Stat CG4 Lactic Acid, ED     Status: None   Collection Time: 08/02/15  2:13 PM  Result Value Ref Range   Lactic Acid, Venous 1.34 0.5 -  2.0 mmol/L  CBG monitoring, ED     Status: Abnormal   Collection Time: 08/02/15  2:14 PM  Result Value Ref Range   Glucose-Capillary 102 (H) 65 - 99 mg/dL   Ct Head Wo Contrast  08/02/2015   CLINICAL DATA:  Right arm weakness.  Dysphagia.  EXAM: CT HEAD WITHOUT CONTRAST  TECHNIQUE: Contiguous axial images were obtained from the base of the skull through the vertex without intravenous contrast.  COMPARISON:  None.  FINDINGS: The ventricles are normal in size and configuration. There is a cavum septum pellucidum, an incidental developmental anomaly.  There are no parenchymal masses or mass effect. Well-defined hypoattenuation is noted extending from the left basal ganglia along the anterior limb of the left internal capsule to the left centrum semiovale consistent with an old infarct. There is another probable old deep white matter infarct just above the anterior horn of the left lateral ventricle. Patchy areas of white matter hypoattenuation are noted bilaterally consistent with moderate chronic microvascular ischemic change.  There is no evidence of a recent cortical infarct.  There are no extra-axial masses or abnormal fluid  collections.  There is no intracranial hemorrhage.  Visualized sinuses and mastoid air cells are clear.  IMPRESSION: 1. No acute intracranial abnormalities. 2. 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  These results were called by telephone at the time of interpretation on 08/02/2015 at 2:18 pm to Dr. Nicole Kindred, who verbally acknowledged these results.   Electronically Signed   By: Lajean Manes M.D.   On: 08/02/2015 14:18    Assessment: 65 y.o. male with multiple risk factors for stroke presenting with TIA or possibly acute small vessel subcortical left CVA.  Stroke Risk Factors - family history, hypertension and smoking  Plan: 1. HgbA1c, fasting lipid panel 2. MRI, MRA  of the brain without contrast 3. PT consult, OT consult, Speech consult 4. Echocardiogram 5. Carotid dopplers 6. Prophylactic therapy-Antiplatelet med: Aspirin  7. Risk factor modification 8. Telemetry monitoring  C.R. Nicole Kindred, MD [facility] [phone]  08/02/2015, 2:28 PM

## 2015-08-02 NOTE — ED Notes (Signed)
Attempted to call report

## 2015-08-02 NOTE — ED Provider Notes (Signed)
CSN: [id number]     Arrival date & time 08/02/15  1325 History   None    Chief Complaint  Patient presents with  . Aphasia   (Consider location/radiation/quality/duration/timing/severity/associated sxs/prior Treatment) Patient is a 64 y.o. male presenting with extremity weakness. The history is provided by the patient and the spouse.  Extremity Weakness This is a new problem. The current episode started 3 to 5 hours ago. The problem has not changed since onset.Associated symptoms include headaches. Pertinent negatives include no abdominal pain and no shortness of breath. Associated symptoms comments: Sl right sided ha , no pain, pos slurred speech, right sided arm and leg weakness, recent checkup ,mild hbp and hypercholest.    Past Medical History  Diagnosis Date  . GERD (gastroesophageal reflux disease)   . Stricture and stenosis of esophagus   . Gastritis     Mild  . Barrett's esophagus   . Depression   . Gastroparesis   . Hyperglycemia   . Renal cyst   . Nephrolithiasis   . Chronic back pain    Past Surgical History  Procedure Laterality Date  . Lumbar disc surgery  2005, 2010    replaced L4 and L5  . Cervical disc surgery  2012   Family History  Problem Relation Age of Onset  . Hypertension Father   . Stroke Father   . Hypertension Mother   . Liver cancer Mother   . Hypertension Sister     2 other sisters has also  . Hypertension Brother     2nd brother has also  . Colon cancer Neg Hx   . Esophageal cancer Neg Hx   . Rectal cancer Neg Hx   . Stomach cancer Neg Hx    Social History  Substance Use Topics  . Smoking status: Current Every Day Smoker -- 0.80 packs/day    Types: Cigarettes  . Smokeless tobacco: Never Used     Comment: Counseling sheet given 3/15  . Alcohol Use: No    Review of Systems  Respiratory: Negative.  Negative for shortness of breath.   Cardiovascular: Negative.   Gastrointestinal: Negative for abdominal pain.  Musculoskeletal:  Positive for extremity weakness.  Neurological: Positive for speech difficulty, weakness and headaches.  All other systems reviewed and are negative.   Allergies  Review of patient's allergies indicates no known allergies.  Home Medications   Prior to Admission medications   Medication Sig Start Date End Date Taking? Authorizing Provider  losartan (COZAAR) 25 MG tablet TAKE 1 TABLET BY MOUTH EVERY DAY 06/25/15   Doe-Hyun R Shawna Orleans, DO  NEXIUM 40 MG capsule Take 1 capsule (40 mg total) by mouth daily. 07/03/15   Inda Castle, MD  ondansetron (ZOFRAN) 8 MG tablet Take 1 tablet by mouth as needed.  10/14/14   Historical Provider, MD  oxymorphone (OPANA ER) 20 MG 12 hr tablet Take 20 mg by mouth daily.    Historical Provider, MD  oxymorphone (OPANA) 10 MG tablet Take 10 mg by mouth as needed for pain (before 1800 each day).     Historical Provider, MD  pravastatin (PRAVACHOL) 40 MG tablet Take 1 tablet (40 mg total) by mouth daily. Patient not taking: Reported on 04/22/2015 12/27/14   Doe-Hyun R Shawna Orleans, DO  terbinafine (LAMISIL) 250 MG tablet Take 1 tablet (250 mg total) by mouth daily. 04/22/15   Dorothyann Peng, NP  varenicline (CHANTIX CONTINUING MONTH PAK) 1 MG tablet Take 1 tablet (1 mg total) by mouth 2 (two) times  daily. 04/22/15   Dorothyann Peng, NP  varenicline (CHANTIX STARTING MONTH PAK) 0.5 MG X 11 & 1 MG X 42 tablet Take one 0.5 mg tablet by mouth once daily for 3 days, then increase to one 0.5 mg tablet twice daily for 4 days, then increase to one 1 mg tablet twice daily. 04/22/15   Dorothyann Peng, NP   Meds Ordered and Administered this Visit   Medications  0.9 %  sodium chloride infusion (not administered)    BP 189/106 mmHg  Pulse 72  Temp(Src) 98.8 F (37.1 C) (Oral)  Resp 16  SpO2 97% No data found.   Physical Exam  Constitutional: He is oriented to person, place, and time. He appears well-developed and well-nourished.  HENT:  Head: Normocephalic.  Eyes: Conjunctivae are normal.  Pupils are equal, round, and reactive to light.  Neck: Normal range of motion. Neck supple.  Cardiovascular: Normal rate and normal heart sounds.   Pulmonary/Chest: Breath sounds normal.  Musculoskeletal:  Sl speech/aphasia and upper and lower ext 4/5 stregnth on right, 5/5 on left.  Neurological: He is alert and oriented to person, place, and time.  Skin: Skin is warm and dry.  Nursing note and vitals reviewed.   ED Course  Procedures (including critical care time)  Labs Review Labs Reviewed - No data to display  Imaging Review No results found.   Visual Acuity Review  Right Eye Distance:   Left Eye Distance:   Bilateral Distance:    Right Eye Near:   Left Eye Near:    Bilateral Near:         MDM   1. Hemispheric carotid artery syndrome    Sent for eval of neurologic event onset 8:30 this am with slurred speech and right sided weakness.    Billy Fischer, MD 08/02/15 1344

## 2015-08-02 NOTE — H&P (Signed)
[facility] History and Physical                                                                                    Richard Vaughn, is a 64 y.o. male  MRN: [id number]   DOB - August 18, 1951  Admit Date - 08/02/2015  Outpatient Primary MD for the patient is Drema Pry, DO  Referring MD: Rancour / ER   Consulting M.D: Nicole Kindred / Neurology    With History of -  Past Medical History  Diagnosis Date  . GERD (gastroesophageal reflux disease)   . Stricture and stenosis of esophagus   . Gastritis     Mild  . Barrett's esophagus   . Depression   . Gastroparesis   . Hyperglycemia   . Renal cyst   . Nephrolithiasis   . Chronic back pain       Past Surgical History  Procedure Laterality Date  . Lumbar disc surgery  2005, 2010    replaced L4 and L5  . Cervical disc surgery  2012    in for   Chief Complaint  Patient presents with  . Code Stroke     HPI This is a 64 yo patient history of hypertension, HLD, chronic LBP, ongoing tobacco abuse, history of gastroparesis with resultant GERD and Barrett's esophagitis and esophageal stricture, depression and history of renal calculi who presented with difficulty speaking and mild right-sided weakness. According to documentation in the medical record patient was last known well around 3:30 AM. When he woke up about 8:30 AM he initially noticed that he was having some trouble speaking and his wife noticed that his speech was slurred. He was "stumbling around" as well but initially attributed this to an expected finding upon initially arising. When the gait imbalance did not resolve patient began to notice that he was having some numbness in the arm and leg. Primarily he noticed numbness in his mouth and and some difficulty word finding. He initially presented to the [facility] urgent care center and due to concerns of a code stroke was transported to the main emergency department.  Upon arrival to the ER code stroke was initiated.  He was of a weighted by neurology with an NIH stroke score of 1. 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 Patient with no prior documented history of stroke or TIA. Unfortunately he cannot undergo tPA since he presented outside the window for treatment. When initially evaluated by the neurologist he was found to have mild drift of the right lower extremity, previously described dysarthria/? Aphasia had resolved and no other acute deficits were noted. He was afebrile although he was a bit hypertensive with a blood pressure reading of 189/106, pulse was 72, respirations 16, room air saturations 97% or toward data unremarkable except for mild hyperglycemia. Troponin was negative, lactic acid was negative and serum alcohol level was less than 5. EKG revealed sinus rhythm without any acute ischemic changes   Review of Systems   In addition to the HPI above,  No Fever-chills, myalgias  or other constitutional symptoms No Headache, changes with Vision or hearing No new problems swallowing food or Liquids, indigestion/reflux No Chest pain, Cough or Shortness of Breath, palpitations, orthopnea or DOE No Abdominal pain, N/V; no melena or hematochezia, no dark tarry stools, Bowel movements are regular, No dysuria, hematuria or flank pain No new skin rashes, lesions, masses or bruises, No new joints pains-aches No recent weight gain or loss No polyuria, polydypsia or polyphagia,  *A full 10 point Review of Systems was done, except as stated above, all other Review of Systems were negative.  Social History Social History  Substance Use Topics  . Smoking status: Current Every Day Smoker -- 0.80 packs/day    Types: Cigarettes  . Smokeless tobacco: Never Used     Comment: Counseling sheet given 3/15  . Alcohol Use: No    Resides at: Private residence  Lives with:  Spouse  Ambulatory status:  Without assistive devices  Employment history: Retired Tour manager   Family History Family History  Problem Relation Age of Onset  . Hypertension Father   . Stroke Father   . Hypertension Mother   . Liver cancer Mother   . Hypertension Sister     2 other sisters has also  . Hypertension Brother     2nd brother has also  . Colon cancer Neg Hx   . Esophageal cancer Neg Hx   . Rectal cancer Neg Hx   . Stomach cancer Neg Hx      Prior to Admission medications   Medication Sig Start Date End Date Taking? Authorizing Provider  losartan (COZAAR) 25 MG tablet TAKE 1 TABLET BY MOUTH EVERY DAY 06/25/15  Yes Doe-Hyun R Yoo, DO  NEXIUM 40 MG capsule Take 1 capsule (40 mg total) by mouth daily. 07/03/15  Yes Inda Castle, MD  ondansetron (ZOFRAN) 8 MG tablet Take 1 tablet by mouth as needed for nausea.  10/14/14  Yes Historical Provider, MD  oxymorphone (OPANA ER) 20 MG 12 hr tablet Take 20 mg by mouth daily.   Yes Historical Provider, MD  pravastatin (PRAVACHOL) 40 MG tablet Take 1 tablet (40 mg total) by mouth daily. Patient not taking: Reported on 04/22/2015 12/27/14   Doe-Hyun R Shawna Orleans, DO  terbinafine (LAMISIL) 250 MG tablet Take 1 tablet (250 mg total) by mouth daily. Patient not taking: Reported on 08/02/2015 04/22/15   Dorothyann Peng, NP  varenicline (CHANTIX CONTINUING MONTH PAK) 1 MG tablet Take 1 tablet (1 mg total) by mouth 2 (two) times daily. Patient not taking: Reported on 08/02/2015 04/22/15   Dorothyann Peng, NP  varenicline (CHANTIX STARTING MONTH PAK) 0.5 MG X 11 & 1 MG X 42 tablet Take one 0.5 mg tablet by mouth once daily for 3 days, then increase to one 0.5 mg tablet twice daily for 4 days, then increase to one 1 mg tablet twice daily. Patient not taking: Reported on 08/02/2015 04/22/15   Dorothyann Peng, NP    No Known Allergies  Physical Exam  Vitals  Temperature 99.3 F (37.4 C).   General:  In no acute distress, appears healthy and well nourished  Psych:  Normal affect,  Denies Suicidal or Homicidal ideations, Awake Alert, Oriented X 3. Speech and thought patterns are clear and appropriate, no apparent short term memory deficits  Neuro: CN II through XII intact with very subtle difficulty with word finding but no slurred speech, Strength 5/5 all 4 extremities although he did have some difficulty with right heel to knee maneuver,  Sensation intact all 4 extremities.  ENT:  Ears and Eyes appear Normal, Conjunctivae clear, PER. Moist oral mucosa without erythema or exudates.  Neck:  Supple, No lymphadenopathy appreciated  Respiratory:  Symmetrical chest wall movement, Good air movement bilaterally, CTAB. Room Air  Cardiac:  RRR, No Murmurs, no LE edema noted, no JVD, No carotid bruits, peripheral pulses palpable at 2+  Abdomen:  Positive bowel sounds, Soft, Non tender, Non distended,  No masses appreciated, no obvious hepatosplenomegaly  Skin:  No Cyanosis, Normal Skin Turgor, No Skin Rash or Bruise.  Extremities: Symmetrical without obvious trauma or injury,  no effusions.  Data Review  CBC  Recent Labs Lab 08/02/15 1413  WBC 6.9  HGB 16.0  17.0  HCT 45.6  50.0  PLT 180  MCV 91.2  MCH 32.0  MCHC 35.1  RDW 13.2  LYMPHSABS 2.0  MONOABS 0.4  EOSABS 0.0  BASOSABS 0.0    Chemistries   Recent Labs Lab 08/02/15 1413  NA 138  141  K 4.3  4.2  CL 105  103  CO2 28  GLUCOSE 127*  117*  BUN 7  8  CREATININE 1.12  1.10  CALCIUM 9.3  AST 19  ALT 22  ALKPHOS 78  BILITOT 0.9    CrCl cannot be calculated (Unknown ideal weight.).  No results for input(s): TSH, T4TOTAL, T3FREE, THYROIDAB in the last 72 hours.  Invalid input(s): FREET3  Coagulation profile  Recent Labs Lab 08/02/15 1413  INR 1.11    No results for input(s): DDIMER in the last 72 hours.  Cardiac Enzymes No results for input(s): CKMB, TROPONINI, MYOGLOBIN in the last 168 hours.  Invalid input(s): CK  Invalid input(s): POCBNP  Urinalysis    Component  Value Date/Time   COLORURINE LT. YELLOW 08/07/2013 [place] 08/07/2013 1552   LABSPEC 1.025 08/07/2013 1552   PHURINE 6.0 08/07/2013 1552   GLUCOSEU NEGATIVE 08/07/2013 1552   GLUCOSEU NEGATIVE 04/24/2009 1141   HGBUR SMALL 08/07/2013 1552   BILIRUBINUR n 04/22/2015 1117   BILIRUBINUR NEGATIVE 08/07/2013 1552   KETONESUR NEGATIVE 08/07/2013 1552   PROTEINUR n 04/22/2015 1117   PROTEINUR NEGATIVE 04/24/2009 1141   UROBILINOGEN 0.2 04/22/2015 1117   UROBILINOGEN 0.2 08/07/2013 1552   NITRITE n 04/22/2015 1117   NITRITE NEGATIVE 08/07/2013 1552   LEUKOCYTESUR Negative 04/22/2015 1117    Imaging results:   Ct Head Wo Contrast  08/02/2015   CLINICAL DATA:  Right arm weakness.  Dysphagia.  EXAM: CT HEAD WITHOUT CONTRAST  TECHNIQUE: Contiguous axial images were obtained from the base of the skull through the vertex without intravenous contrast.  COMPARISON:  None.  FINDINGS: The ventricles are normal in size and configuration. There is a cavum septum pellucidum, an incidental developmental anomaly.  There are no parenchymal masses or mass effect. Well-defined hypoattenuation is noted extending from the left basal ganglia along the anterior limb of the left internal capsule to the left centrum semiovale consistent with an old infarct. There is another probable old deep white matter infarct just above the anterior horn of the left lateral ventricle. Patchy areas of white matter hypoattenuation are noted bilaterally consistent with moderate chronic microvascular ischemic change.  There is no evidence of a recent cortical infarct.  There are no extra-axial masses or abnormal fluid collections.  There is no intracranial hemorrhage.  Visualized sinuses and mastoid air cells are clear.  IMPRESSION: 1. No acute intracranial abnormalities. 2. Chronic microvascular ischemic change and old left base  a ganglia and deep white matter infarcts.  These results were called by telephone at the time of  interpretation on 08/02/2015 at 2:18 pm to Dr. Nicole Kindred, who verbally acknowledged these results.   Electronically Signed   By: Lajean Manes M.D.   On: 08/02/2015 14:18     EKG: (Independently reviewed)  sinus rhythm, ventricular rate 74 bpm, QTC 436 ms, no ST segment or T-wave changes concerning for skin anemia   Assessment & Plan  Principal Problem:   CVA  -Admit to telemetry -Neurology following -Stat MRI and MRA brain -Carotid duplex and echocardiogram -Hemoglobin A1c and lipid panel -PT/OT/SLP -Passed bedside swallow eval also proceed with heart healthy diet -We'll utilize full dose aspirin for anti-platelet  Active Problems:   Essential hypertension -Initial blood pressure poorly controlled -We'll allow permissive hypertension and utilize prn Apresoline for blood pressure readings greater than or equal to 619 systolic -Was on Cozaar prior to admission    ESOPHAGEAL STRICTURE/GERD/Gastroparesis -Has issues with recurrent episodic emesis with last event about 1 week ago    TOBACCO ABUSE -Nicotine patch    Acute hyperglycemia -Likely from acute distress -Hemoglobin A1c as above    Chronic low back pain -Continue preadmission Opana ER daily    HLD -Continue Pravachol    DVT Prophylaxis:  Lovenox  Family Communication:   Wife at bedside  Code Status:  Full code  Condition:  Stable  Discharge disposition:  Anticipate discharge to home environment in the next 24-72 hours pending resolution of presenting symptoms and pending outcome of PT/OT/SLP evaluations  Time spent in minutes : 60      Elicia Lui L. ANP on 08/02/2015 at 3:23 PM  Between 7am to 7pm - Pager - [phone]  After 7pm go to [email or web address] - password [email or web address]  And look for the night coverage person covering me after hours  [facility]

## 2015-08-02 NOTE — Progress Notes (Signed)
Patient arrived from E.D via MRI. Alert and oriented X4. Oriented to room and unit. Tele attached and called in. Family at bedside.

## 2015-08-02 NOTE — ED Notes (Signed)
Attempted to call report. Stated to call back in 10 min.

## 2015-08-02 NOTE — Progress Notes (Signed)
Code stroke called at 1337, patient arrived to [facility] ED via [facility] at 1357.  As per patient LSN 0300, awoke at 0830 with difficulty getting words out and right side weakness.  NIHSS 1. Cancelled at 1420

## 2015-08-03 ENCOUNTER — Inpatient Hospital Stay (HOSPITAL_COMMUNITY): Payer: Federal, State, Local not specified - PPO

## 2015-08-03 DIAGNOSIS — I639 Cerebral infarction, unspecified: Secondary | ICD-10-CM

## 2015-08-03 DIAGNOSIS — K3184 Gastroparesis: Secondary | ICD-10-CM

## 2015-08-03 DIAGNOSIS — K222 Esophageal obstruction: Secondary | ICD-10-CM

## 2015-08-03 DIAGNOSIS — Z720 Tobacco use: Secondary | ICD-10-CM

## 2015-08-03 DIAGNOSIS — R7309 Other abnormal glucose: Secondary | ICD-10-CM

## 2015-08-03 DIAGNOSIS — K219 Gastro-esophageal reflux disease without esophagitis: Secondary | ICD-10-CM

## 2015-08-03 LAB — BASIC METABOLIC PANEL
Anion gap: 8 (ref 5–15)
BUN: 6 mg/dL (ref 6–20)
CO2: 27 mmol/L (ref 22–32)
CREATININE: 1 mg/dL (ref 0.61–1.24)
Calcium: 9.4 mg/dL (ref 8.9–10.3)
Chloride: 105 mmol/L (ref 101–111)
GFR calc Af Amer: >60 mL/min (ref >60)
GLUCOSE: 100 mg/dL — AB (ref 65–99)
Potassium: 3.5 mmol/L (ref 3.5–5.1)
SODIUM: 140 mmol/L (ref 135–145)

## 2015-08-03 LAB — CBC
HCT: 46 % (ref 39.0–52.0)
Hemoglobin: 15.9 g/dL (ref 13.0–17.0)
MCH: 31.7 pg (ref 26.0–34.0)
MCHC: 34.6 g/dL (ref 30.0–36.0)
MCV: 91.6 fL (ref 78.0–100.0)
PLATELETS: 167 10*3/uL (ref 150–400)
RBC: 5.02 MIL/uL (ref 4.22–5.81)
RDW: 13.2 % (ref 11.5–15.5)
WBC: 9.7 10*3/uL (ref 4.0–10.5)

## 2015-08-03 LAB — LIPID PANEL
CHOL/HDL RATIO: 5.1 ratio
CHOLESTEROL: 172 mg/dL (ref 0–200)
HDL: 34 mg/dL — AB (ref >40)
LDL Cholesterol: 115 mg/dL — ABNORMAL HIGH (ref 0–99)
TRIGLYCERIDES: 116 mg/dL (ref <150)
VLDL: 23 mg/dL (ref 0–40)

## 2015-08-03 MED ORDER — HYDROMORPHONE HCL 1 MG/ML IJ SOLN
0.5000 mg | Freq: Once | INTRAMUSCULAR | Status: AC
  Administered 2015-08-03: 0.5 mg via INTRAVENOUS
  Filled 2015-08-03: qty 1

## 2015-08-03 MED ORDER — PRAVASTATIN SODIUM 40 MG PO TABS
40.0000 mg | ORAL_TABLET | Freq: Every day | ORAL | Status: DC
  Administered 2015-08-03: 40 mg via ORAL
  Filled 2015-08-03: qty 1

## 2015-08-03 NOTE — Progress Notes (Signed)
VASCULAR LAB PRELIMINARY  PRELIMINARY  PRELIMINARY  PRELIMINARY  Carotid duplex completed.    Preliminary report:  Bilateral:  1-39% ICA stenosis.  Vertebral artery flow is antegrade.      Cherree Conerly, RVT 08/03/2015, 10:38 AM

## 2015-08-03 NOTE — Progress Notes (Signed)
Provider paged X2 to make aware of pts c/o chronic back pain not relieved by scheduled morphine.

## 2015-08-03 NOTE — Progress Notes (Signed)
Utilization Review Completed.Dowell, Deborah T9/18/2016  

## 2015-08-03 NOTE — Discharge Instructions (Signed)
Artrodesis vertebral, cuidados posteriores (Spinal Fusion, Care After)  Siga estas instrucciones durante las prximas semanas. Estas indicaciones le proporcionan informacin general acerca de cmo deber cuidarse despus del procedimiento. El mdico tambin podr darle instrucciones especficas. El tratamiento ha sido planificado segn las prcticas mdicas actuales, pero en algunos casos pueden ocurrir problemas. Comunquese con el mdico si tiene algn problema o tiene preguntas despus del procedimiento. Cass los medicamentos para el dolor que le haya prescrito el mdico. No tome medicamentos para el dolor de venta libre a menos que se lo haya indicado el mdico.  No conduzca si est tomando analgsicos narcticos.  Cambie el vendaje si fuera necesario o con la frecuencia que le indique el profesional.  No moje el corte de la ciruga (incisin) Luego de [employer] podr tomar duchas breves (en lugar de baos), pero debe mantener la incisin limpia y Marine. [place] la incisin con un envoltorio plstico mientras se baa y Location manager incisin seca. Luego de [employer] luego de la [place], una vez que la incisin haya curado y el mdico lo autorice, podr tomar baos o Social worker.  Si le han prescrito medicamentos para prevenir la coagulacin de la [place], siga las instrucciones con atencin.  Verifique la zona que rodea la incisin con frecuencia. Observe seales de enrojecimiento e inflamacin. Adems, observe si presenta una prdida de lquido de la herida. Puede utilizar un espejo o pedirle a un miembro de la familia que observe su incisin si est en algn lugar difcil de ver.  Hable con el mdico acerca de las actividades que debe evitar y por cunto [place].  Camine todo lo que pueda.  No levante pesos mayores a 10 libras (4.5 kg) hasta que el mdico lo autorice.  No gire ni se voltee por algunas semanas. Intente no jalar cosas. Evite estar  sentado durante largos perodos. Cambie de posicin al menos una vez por hora.  Consulte con el mdico qu tipo de ejercicios debe realizar para reforzar su espalda y cundo debe comenzar a Secondary school teacher. SOLICITE ATENCIN MDICA DE INMEDIATO SI:   El dolor empeora repentinamente.  La zona de la incisin se ve roja, inflamada, sangra o pierde lquido.  Presenta mayor dolor, adormecimiento, debilidad o inflamacin en las piernas o los pies.  Tiene problemas para Aeronautical engineer miccin o los movimientos intestinales.  Tiene dificultad para respirar.  Siente dolor en el pecho.  Tiene fiebre. ASEGRESE DE QUE:   Comprende estas instrucciones.  Controlar su enfermedad.  Solicitar ayuda de inmediato si no mejora o si empeora. Document Released: 01/28/2009 Document Revised: 01/24/2012 [facility] Patient Information 2015 [place]. This information is not intended to replace advice given to you by your health care provider. Make sure you discuss any questions you have with your health care provider.

## 2015-08-03 NOTE — Evaluation (Signed)
Physical Therapy Evaluation Patient Details Name: Richard Vaughn MRN: [id number] DOB: [birth year] Today's Date: 08/03/2015   History of Present Illness  64 year old male with h/o chronic back pain, GERD, Hypertension, esophageal stricture, presented with right sided weakness and dysarthria. MRI revealed acute infarction in the left basal ganglia and radiating white  Clinical Impression  Patient demonstrates deficits in functional mobility as indicated below. Will need continued skilled PT to address deficits and maximize function. Will see as indicated and progress as tolerated. Recommend Outpatient neuro follow up secondary to RLE deficits.    Follow Up Recommendations Outpatient PT;Supervision - Intermittent (Outpatient Neuro Rehab)    Equipment Recommendations  None recommended by PT    Recommendations for Other Services       Precautions / Restrictions        Mobility  Bed Mobility Overal bed mobility: Independent             General bed mobility comments: no physical assist required  Transfers Overall transfer level: Modified independent Equipment used: None             General transfer comment: increased time to come to standing, no physical assist required  Ambulation/Gait Ambulation/Gait assistance: Supervision;Min guard Ambulation Distance (Feet): 210 Feet Assistive device: None Gait Pattern/deviations: Step-through pattern;Decreased dorsiflexion - right Gait velocity: decreased Gait velocity interpretation: Below normal speed for age/gender General Gait Details: patient with some functional weakness, noted genu recurvatum and reports decreased sensation. Patient with compensations of knee extension during movements  Stairs Stairs: Yes Stairs assistance: Min assist Stair Management: One rail Left;Step to pattern;Forwards Number of Stairs: 4 General stair comments: one episode of LOB as patient could not clear RLE during step. VCs for sequencing and  technique with step to method due to RLE weakness and sensory deficits  Wheelchair Mobility    Modified Rankin (Stroke Patients Only) Modified Rankin (Stroke Patients Only) Pre-Morbid Rankin Score: No symptoms Modified Rankin: Moderately severe disability     Balance Overall balance assessment: No apparent balance deficits (not formally assessed)                                           Pertinent Vitals/Pain Pain Assessment: No/denies pain    Home Living Family/patient expects to be discharged to:: Private residence Living Arrangements: Spouse/significant other Available Help at Discharge: Family Type of Home: House Home Access: Stairs to enter   Technical brewer of Steps: 4 Home Layout: Able to live on main level with bedroom/bathroom Home Equipment: Environmental consultant - 2 wheels      Prior Function Level of Independence: Independent               Hand Dominance   Dominant Hand: Right    Extremity/Trunk Assessment   Upper Extremity Assessment: Defer to OT evaluation           Lower Extremity Assessment: RLE deficits/detail RLE Deficits / Details: RLE modest weakness noted upon testing, 4-/5       Communication      Cognition Arousal/Alertness: Awake/alert Behavior During Therapy: WFL for tasks assessed/performed Overall Cognitive Status: Within Functional Limits for tasks assessed                      General Comments      Exercises        Assessment/Plan    PT Assessment Patient  needs continued PT services  PT Diagnosis Difficulty walking;Abnormality of gait   PT Problem List Decreased strength;Decreased mobility;Decreased coordination  PT Treatment Interventions DME instruction;Gait training;Stair training;Functional mobility training;Therapeutic activities;Therapeutic exercise;Balance training;Neuromuscular re-education;Patient/family education   PT Goals (Current goals can be found in the Care Plan section)  Acute Rehab PT Goals Patient Stated Goal: to be able to drive PT Goal Formulation: With patient/family Time For Goal Achievement: 08/17/15 Potential to Achieve Goals: Good    Frequency Min 3X/week   Barriers to discharge        Co-evaluation               End of Session Equipment Utilized During Treatment: Gait belt Activity Tolerance: Patient tolerated treatment well Patient left: in bed;with call bell/phone within reach;with family/visitor present (sitting EOB) Nurse Communication: Mobility status         Time: 0630-1601 PT Time Calculation (min) (ACUTE ONLY): 17 min   Charges:   PT Evaluation $Initial PT Evaluation Tier I: 1 Procedure     PT G CodesDuncan Vaughn 2015-08-12, 8:48 AM  Richard Vaughn, PT DPT  [phone]

## 2015-08-03 NOTE — Progress Notes (Signed)
[facility] PROGRESS NOTE   Richard Vaughn KGY:[id number] DOB: 11-Oct-1951 DOA: 08/02/2015 PCP: Drema Pry, DO  HPI/Subjective: Seen with wife at bedside, still have some slurred speech "my tongue is still heavy"  Assessment/Plan: Principal Problem:   CVA (cerebral infarction) Active Problems:   TOBACCO ABUSE   ESOPHAGEAL STRICTURE   GERD   Gastroparesis   Essential hypertension   Acute hyperglycemia    CVA  -Presented with slurred speech -MRI of the brain showed acute infarction involving the left basal ganglia and radiating white matter tracts. -Carotid duplex and echocardiogram -Hemoglobin A1c and lipid panel -PT/OT/SLP -Passed bedside swallow eval also proceed with heart healthy diet -Neurology consulted, aspirin started as antiplatelets.   Essential hypertension -Initial blood pressure poorly controlled -We'll allow permissive hypertension and utilize prn Apresoline for blood pressure readings greater than or equal to 026 systolic -Was on Cozaar prior to admission   ESOPHAGEAL STRICTURE/GERD/Gastroparesis -Has issues with recurrent episodic emesis with last event about 1 week ago   TOBACCO ABUSE -Nicotine patch   Acute hyperglycemia -Likely from acute distress -Hemoglobin A1c as above   Chronic low back pain -Continue preadmission [facility] daily   HLD -Continue Pravachol  Code Status: Full Code Family Communication: Plan discussed with the patient. Disposition Plan: Remains inpatient Diet: Diet Heart Room service appropriate?: Yes; Fluid consistency:: Thin  Consultants:  Neurology  Procedures:  None  Antibiotics:  None   Objective: Filed Vitals:   08/03/15 0951  BP: 136/99  Pulse: 68  Temp: 97.9 F (36.6 C)  Resp: 17   No intake or output data in the 24 hours ending 08/03/15 1252 Filed Weights   08/02/15 2036  Weight: 123.8 kg (272 lb 14.9 oz)    Exam: General: Alert and awake, oriented x3, not in any acute  distress. HEENT: anicteric sclera, pupils reactive to light and accommodation, EOMI CVS: S1-S2 clear, no murmur rubs or gallops Chest: clear to auscultation bilaterally, no wheezing, rales or rhonchi Abdomen: soft nontender, nondistended, normal bowel sounds, no organomegaly Extremities: no cyanosis, clubbing or edema noted bilaterally Neuro: Cranial nerves II-XII intact, no focal neurological deficits  Data Reviewed: Basic Metabolic Panel:  Recent Labs Lab 08/02/15 1413 08/03/15 0533  NA 138  141 140  K 4.3  4.2 3.5  CL 105  103 105  CO2 28 27  GLUCOSE 127*  117* 100*  BUN 7  8 6   CREATININE 1.12  1.10 1.00  CALCIUM 9.3 9.4   Liver Function Tests:  Recent Labs Lab 08/02/15 1413  AST 19  ALT 22  ALKPHOS 78  BILITOT 0.9  PROT 7.1  ALBUMIN 4.1   No results for input(s): LIPASE, AMYLASE in the last 168 hours. No results for input(s): AMMONIA in the last 168 hours. CBC:  Recent Labs Lab 08/02/15 1413 08/03/15 0533  WBC 6.9 9.7  NEUTROABS 4.4  --   HGB 16.0  17.0 15.9  HCT 45.6  50.0 46.0  MCV 91.2 91.6  PLT 180 167   Cardiac Enzymes: No results for input(s): CKTOTAL, CKMB, CKMBINDEX, TROPONINI in the last 168 hours. BNP (last 3 results) No results for input(s): BNP in the last 8760 hours.  ProBNP (last 3 results) No results for input(s): PROBNP in the last 8760 hours.  CBG:  Recent Labs Lab 08/02/15 1414  GLUCAP 102*    Micro No results found for this or any previous visit (from the past 240 hour(s)).   Studies: Dg Chest 2 View  08/03/2015  CLINICAL DATA:  CVA.  EXAM: CHEST  2 VIEW  COMPARISON:  10/16/2008  FINDINGS: The cardiomediastinal contours are normal. Mild bronchial thickening that appears chronic. Pulmonary vasculature is normal. No consolidation, pleural effusion, or pneumothorax. No acute osseous abnormalities are seen.  IMPRESSION: No acute pulmonary process.   Electronically Signed   By: Jeb Levering M.D.   On: 08/03/2015  01:36   Ct Head Wo Contrast  08/02/2015   CLINICAL DATA:  Right arm weakness.  Dysphagia.  EXAM: CT HEAD WITHOUT CONTRAST  TECHNIQUE: Contiguous axial images were obtained from the base of the skull through the vertex without intravenous contrast.  COMPARISON:  None.  FINDINGS: The ventricles are normal in size and configuration. There is a cavum septum pellucidum, an incidental developmental anomaly.  There are no parenchymal masses or mass effect. Well-defined hypoattenuation is noted extending from the left basal ganglia along the anterior limb of the left internal capsule to the left centrum semiovale consistent with an old infarct. There is another probable old deep white matter infarct just above the anterior horn of the left lateral ventricle. Patchy areas of white matter hypoattenuation are noted bilaterally consistent with moderate chronic microvascular ischemic change.  There is no evidence of a recent cortical infarct.  There are no extra-axial masses or abnormal fluid collections.  There is no intracranial hemorrhage.  Visualized sinuses and mastoid air cells are clear.  IMPRESSION: 1. No acute intracranial abnormalities. 2. 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  These results were called by telephone at the time of interpretation on 08/02/2015 at 2:18 pm to Dr. Nicole Kindred, who verbally acknowledged these results.   Electronically Signed   By: Lajean Manes M.D.   On: 08/02/2015 14:18   Mr [name] Head Wo Contrast  08/02/2015   CLINICAL DATA:  Acute presentation with difficulty speaking and right-sided weakness.  EXAM: MRI HEAD WITHOUT CONTRAST  MRA HEAD WITHOUT CONTRAST  TECHNIQUE: Multiplanar, multiecho pulse sequences of the brain and surrounding structures were obtained without intravenous contrast. Angiographic images of the head were obtained using MRA technique without contrast.  COMPARISON:  08/02/2015 CT  FINDINGS: MRI HEAD FINDINGS   Diffusion imaging shows acute infarction in the left basal ganglia and radiating white matter tracts. No other acute infarction.  There chronic small-vessel ischemic changes of the pons. No focal cerebellar insult. There are old small vessel infarctions affecting the thalami and basal ganglia and patchy and confluent chronic small vessel ischemic changes throughout the cerebral hemispheric white matter. No large vessel territory infarction. No mass lesion, acute hemorrhage, hydrocephalus or extra-axial collection. There is hemosiderin deposition associated with a few of the old white matter infarctions. No pituitary mass. No inflammatory sinus disease. There is a mastoid effusion on the left.  MRA HEAD FINDINGS  Both internal carotid arteries are widely patent into the brain. No siphon stenosis. The anterior and middle cerebral vessels are patent without proximal stenosis, aneurysm or vascular malformation.  Both vertebral arteries are patent with the right being dominant. No basilar stenosis. Posterior circulation branch vessels are patent.  IMPRESSION: Acute infarction in the left basal ganglia and radiating white matter tracts. No swelling or acute hemorrhage.  Chronic small vessel ischemic changes elsewhere throughout the brain.  Intracranial MR angiography of the large and medium size vessels is negative.   Electronically Signed   By: Nelson Chimes M.D.   On: 08/02/2015 16:56   Mr Brain Wo Contrast  08/02/2015  CLINICAL DATA:  Acute presentation with difficulty speaking and right-sided weakness.  EXAM: MRI HEAD WITHOUT CONTRAST  MRA HEAD WITHOUT CONTRAST  TECHNIQUE: Multiplanar, multiecho pulse sequences of the brain and surrounding structures were obtained without intravenous contrast. Angiographic images of the head were obtained using MRA technique without contrast.  COMPARISON:  08/02/2015 CT  FINDINGS: MRI HEAD FINDINGS  Diffusion imaging shows acute infarction in the left basal ganglia and radiating  white matter tracts. No other acute infarction.  There chronic small-vessel ischemic changes of the pons. No focal cerebellar insult. There are old small vessel infarctions affecting the thalami and basal ganglia and patchy and confluent chronic small vessel ischemic changes throughout the cerebral hemispheric white matter. No large vessel territory infarction. No mass lesion, acute hemorrhage, hydrocephalus or extra-axial collection. There is hemosiderin deposition associated with a few of the old white matter infarctions. No pituitary mass. No inflammatory sinus disease. There is a mastoid effusion on the left.  MRA HEAD FINDINGS  Both internal carotid arteries are widely patent into the brain. No siphon stenosis. The anterior and middle cerebral vessels are patent without proximal stenosis, aneurysm or vascular malformation.  Both vertebral arteries are patent with the right being dominant. No basilar stenosis. Posterior circulation branch vessels are patent.  IMPRESSION: Acute infarction in the left basal ganglia and radiating white matter tracts. No swelling or acute hemorrhage.  Chronic small vessel ischemic changes elsewhere throughout the brain.  Intracranial MR angiography of the large and medium size vessels is negative.   Electronically Signed   By: Nelson Chimes M.D.   On: 08/02/2015 16:56    Scheduled Meds: . aspirin  300 mg Rectal Daily   Or  . aspirin  325 mg Oral Daily  . enoxaparin (LOVENOX) injection  40 mg Subcutaneous Q24H  . morphine  60 mg Oral Daily  . nicotine  21 mg Transdermal Daily  . pantoprazole  40 mg Oral Daily  . pravastatin  40 mg Oral q1800   Continuous Infusions:      Time spent: 35 minutes    [facility]  [facility] Pager [phone] If 7PM-7AM, please contact night-coverage at [email or web address], password [facility] 08/03/2015, 12:52 PM  LOS: 1 day

## 2015-08-03 NOTE — Progress Notes (Signed)
STROKE TEAM PROGRESS NOTE  HPI Richard Vaughn is an 64 y.o. male with a history of hypertension, Barrett's esophagus, GERD and chronic back pain, presenting with new onset speech output difficulty and mild right-sided weakness. Patient had word finding difficulty stuttering but no dysarthria. Patient was last known well at about 3:30 AM. When he woke up again at about 8:30 deficits were present. Patient is not aware of any previous stroke or TIA. 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 NIH stroke score was 1.  LSN: 3:30 AM on 08/02/2015 tPA Given: No: Minimal deficits and beyond time window for treatment consideration mRankin:    SUBJECTIVE (INTERVAL HISTORY) His  Wife  is at the bedside.  Overall he feels his condition is unchanged.     OBJECTIVE Temp:  [98 F (36.7 C)-99.3 F (37.4 C)] 98 F (36.7 C) (09/18 0630) Pulse Rate:  [56-72] 60 (09/18 0630) Cardiac Rhythm:  [-] Normal sinus rhythm (09/18 0700) Resp:  [14-18] 18 (09/18 0630) BP: (128-189)/(89-106) 128/90 mmHg (09/18 0630) SpO2:  [95 %-98 %] 98 % (09/17 2230) Weight:  [123.8 kg (272 lb 14.9 oz)] 123.8 kg (272 lb 14.9 oz) (09/17 2036)  CBC:  Recent Labs Lab 08/02/15 1413 08/03/15 0533  WBC 6.9 9.7  NEUTROABS 4.4  --   HGB 16.0  17.0 15.9  HCT 45.6  50.0 46.0  MCV 91.2 91.6  PLT 180 564    Basic Metabolic Panel:  Recent Labs Lab 08/02/15 1413 08/03/15 0533  NA 138  141 140  K 4.3  4.2 3.5  CL 105  103 105  CO2 28 27  GLUCOSE 127*  117* 100*  BUN 7  8 6   CREATININE 1.12  1.10 1.00  CALCIUM 9.3 9.4    Lipid Panel:    Component Value Date/Time   CHOL 172 08/03/2015 0535   TRIG 116 08/03/2015 0535   TRIG 121 11/01/2006 1056   HDL 34* 08/03/2015 0535   CHOLHDL 5.1 08/03/2015 0535   CHOLHDL 5.0 CALC 11/01/2006 1056   VLDL 23 08/03/2015 0535   LDLCALC 115* 08/03/2015 0535   HgbA1c: No results found for: HGBA1C Urine Drug Screen: No  results found for: LABOPIA, COCAINSCRNUR, LABBENZ, AMPHETMU, THCU, LABBARB    IMAGING  Dg Chest 2 View 08/03/2015    No acute pulmonary process.     Ct Head Wo Contrast 08/02/2015    1. No acute intracranial abnormalities.  2. 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     Mr [name] Head Wo Contrast 08/02/2015    Acute infarction in the left basal ganglia and radiating white matter tracts. No swelling or acute hemorrhage.  Chronic small vessel ischemic changes elsewhere throughout the brain.  Intracranial MR angiography of the large and medium size vessels is negative.      PHYSICAL EXAM Pleasant middle-aged African-American male currently not in distress. . Afebrile. Head is nontraumatic. Neck is supple without bruit.    Cardiac exam no murmur or gallop. Lungs are clear to auscultation. Distal pulses are well felt. Neurological Exam :  Awake alert oriented 3. Speech is slightly nonfluent but clear occasional word hesitancy. No dysarthria. Pupils equal reactive. Fundi were not visualized. Vision acuity and fields seem adequate. Tongue midline. Good cough and gag. Motor system exam no upper or lower extremity drift. Weakness of right grip and intrinsic hand muscles. Fine finger movements are diminished  on the right. Orbits left over right upper extremity. Symmetric lower extremity strength without focal weakness. Sensation is intact. Coordination is slow but accurate on the right. Plantars are downgoing. Gait was not tested.    ASSESSMENT/PLAN [name] is a 64 y.o. male with history of hypertension, Barrett's esophagus, GERD and chronic back pain, presenting with speech difficulties and right-sided weakness. He did not receive IV t-PA due to mild deficits.   Stroke:  Non-Dominant  Basal ganglia infarct embolic secondary to small vessel disease.  Resultant  mild right hand weakness(  MRI  Acute infarction in the left basal  ganglia and radiating white matter tracts  MRA  Intracranial MR angiography of the large and medium size vessels is negative.   Carotid Doppler pending  2D Echo pending  LDL 115  HgbA1c pending  VTE prophylaxis - Lovenox  Diet Heart Room service appropriate?: Yes; Fluid consistency:: Thin  no antithrombotic prior to admission, now on aspirin 325 mg orally every day  Patient counseled to be compliant with his antithrombotic medications  Ongoing aggressive stroke risk factor management  Therapy recommendations: Outpatient physical therapy recommended  Disposition: Pending  Hypertension  Stable  Permissive hypertension (OK if < 220/120) but gradually normalize in 5-7 days  Hyperlipidemia  Home meds:  Pravachol 40 mg daily - patient reports he stopped taking this medication.  LDL 115, goal < 70  Restart Pravachol  Continue statin at discharge    Other Stroke Risk Factors  Advanced age  Cigarette smoker, advised to stop smoking  Obesity, Body mass index is 35.03 kg/(m^2).   Hx stroke/TIA  Family hx stroke (father)   Other Active Problems  Medical noncompliance  Hospital day # [place] PA-C [facility] Pager [phone] 08/03/2015, 10:23 AM I have personally examined this patient, reviewed notes, independently viewed imaging studies, participated in medical decision making and plan of care. I have made any additions or clarifications directly to the above note. Agree with note above. He presented with right face and arm weakness due to left basal ganglia infarct etiology likely small vessel disease. He remains at risk for neurological worsening, recurrent stroke, TIA needs ongoing stroke evaluation and aggressive risk factor modification. Continue aspirin for stroke prevention and patient counseled to quit smoking and be compliant with his medications. He was asked to consider possible participation in the PARFAIT stroke trial but  after review of written materials he declined.  Antony Contras, MD Medical Director [facility] Pager: [phone] 08/03/2015 2:24 PM     To contact Stroke Continuity provider, please refer to [email or web address] After hours, contact General Neurology

## 2015-08-04 ENCOUNTER — Inpatient Hospital Stay (HOSPITAL_COMMUNITY): Payer: Federal, State, Local not specified - PPO

## 2015-08-04 DIAGNOSIS — R471 Dysarthria and anarthria: Secondary | ICD-10-CM | POA: Insufficient documentation

## 2015-08-04 DIAGNOSIS — I6789 Other cerebrovascular disease: Secondary | ICD-10-CM

## 2015-08-04 LAB — HEMOGLOBIN A1C
Hgb A1c MFr Bld: 5.5 % (ref 4.8–5.6)
Mean Plasma Glucose: 111 mg/dL

## 2015-08-04 MED ORDER — ASPIRIN 325 MG PO TABS: 325.0000 mg | ORAL_TABLET | Freq: Every day | ORAL | Status: DC

## 2015-08-04 NOTE — Progress Notes (Signed)
Patient is discharged from room 5C09 at this time. Alert and in stable condition. IV site d/c'd as well as tele. Instructions read to patient and understanding verbalized. Left unit via wheelchair with family and all belongings at side.

## 2015-08-04 NOTE — Progress Notes (Signed)
Physical Therapy Treatment Patient Details Name: Richard Vaughn MRN: [id number] DOB: [birth year] Today's Date: 08/04/2015    History of Present Illness 64 year old male with h/o chronic back pain, GERD, Hypertension, esophageal stricture, presented with right sided weakness and dysarthria. MRI revealed acute infarction in the left basal ganglia and radiating white    PT Comments    Pt emotionally labile and tearful in addition to R sided weakness and balance impairment. Pt at moderate risk of falls as demo'd by score of 16 on DGI. Provided pt with HEP with theraband for R LE. Pt with c/o headache s/p walking. Pt with BP of 145/102, RN notified. Cont' to recommend outpt PT to address R LE weakness and balance impairment to achieve indep function.  Follow Up Recommendations  Outpatient PT;Supervision - Intermittent     Equipment Recommendations  None recommended by PT    Recommendations for Other Services       Precautions / Restrictions Precautions Precautions: Fall Precaution Comments: R LE weakness, aphasia Restrictions Weight Bearing Restrictions: No    Mobility  Bed Mobility Overal bed mobility: Independent                Transfers Overall transfer level: Modified independent Equipment used: None             General transfer comment: increased time, had mild retropulsion requiring bracing legs on bed however able to manage without assist  Ambulation/Gait Ambulation/Gait assistance: Supervision Ambulation Distance (Feet): 210 Feet Assistive device: None Gait Pattern/deviations: Step-through pattern Gait velocity: decreased Gait velocity interpretation: Below normal speed for age/gender General Gait Details: mild R LE lag and occasional decreased foot clearance but able to recover independently. Pt with report "my R leg is getting tired" s/p ambulation   Stairs Stairs: Yes Stairs assistance: Min guard Stair Management: One rail Left;Step to  pattern Number of Stairs: 12 General stair comments: pt able to initiate ascending with R LE this date however difficult, cont' to recommend step to pattern leading with L LE . Pt unable to descend with R LE safely con't to recommend L LE.  Wheelchair Mobility    Modified Rankin (Stroke Patients Only) Modified Rankin (Stroke Patients Only) Pre-Morbid Rankin Score: No symptoms Modified Rankin: Moderately severe disability     Balance                                    Cognition Arousal/Alertness: Awake/alert Behavior During Therapy: WFL for tasks assessed/performed Overall Cognitive Status: Within Functional Limits for tasks assessed                      Exercises General Exercises - Lower Extremity Ankle Circles/Pumps: AROM;20 reps (with orange and green theraband) Long Arc Quad: AROM;Right;15 reps (with orange and green therabnd) Heel Slides: AROM;Right;20 reps;Seated (with orange and green theraband) Hip Flexion/Marching: AROM;Right;20 reps;Seated (with orange and green theraband) Toe Raises: AROM;Right;20 reps (with orange and green theraband) Heel Raises: AROM;20 reps;Right (with orange and green theraband)    General Comments        Pertinent Vitals/Pain Pain Assessment: 0-10 Pain Score: 5  Pain Location: headache Pain Descriptors / Indicators: Headache Pain Intervention(s): Monitored during session (alerted RN)    Home Living                      Prior Function  PT Goals (current goals can now be found in the care plan section) Acute Rehab PT Goals Patient Stated Goal: to talk and walk normal again Progress towards PT goals: Progressing toward goals    Frequency  Min 3X/week    PT Plan Current plan remains appropriate    Co-evaluation             End of Session Equipment Utilized During Treatment: Gait belt Activity Tolerance: Patient tolerated treatment well Patient left: in bed;with call bell/phone  within reach;with family/visitor present     Time: 0920-1004 PT Time Calculation (min) (ACUTE ONLY): 44 min  Charges:  $Gait Training: 23-37 mins $Therapeutic Exercise: 8-22 mins                    G Codes:      Kingsley Callander 08/04/2015, 10:23 AM   Kittie Plater, PT, DPT Pager #: [phone] Office #: [phone]

## 2015-08-04 NOTE — Progress Notes (Signed)
STROKE TEAM PROGRESS NOTE  HPI Richard Vaughn is an 64 y.o. male with a history of hypertension, Barrett's esophagus, GERD and chronic back pain, presenting with new onset speech output difficulty and mild right-sided weakness. Patient had word finding difficulty stuttering but no dysarthria. Patient was last known well at about 3:30 AM. When he woke up again at about 8:30 deficits were present. Patient is not aware of any previous stroke or TIA. 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 NIH stroke score was 1.  LSN: 3:30 AM on 08/02/2015 tPA Given: No: Minimal deficits and beyond time window for treatment consideration mRankin:    SUBJECTIVE (INTERVAL HISTORY)    Overall he feels his condition is unchanged.  Echo just completed   OBJECTIVE Temp:  [97.9 F (36.6 C)-98.2 F (36.8 C)] 98 F (36.7 C) (09/19 0646) Pulse Rate:  [60-72] 65 (09/19 0646) Cardiac Rhythm:  [-] Normal sinus rhythm (09/19 0730) Resp:  [16-18] 18 (09/19 0646) BP: (125-168)/(74-93) 125/87 mmHg (09/19 0646) SpO2:  [94 %-98 %] 98 % (09/19 0646)  CBC:   Recent Labs Lab 08/02/15 1413 08/03/15 0533  WBC 6.9 9.7  NEUTROABS 4.4  --   HGB 16.0  17.0 15.9  HCT 45.6  50.0 46.0  MCV 91.2 91.6  PLT 180 992    Basic Metabolic Panel:   Recent Labs Lab 08/02/15 1413 08/03/15 0533  NA 138  141 140  K 4.3  4.2 3.5  CL 105  103 105  CO2 28 27  GLUCOSE 127*  117* 100*  BUN 7  8 6   CREATININE 1.12  1.10 1.00  CALCIUM 9.3 9.4    Lipid Panel:     Component Value Date/Time   CHOL 172 08/03/2015 0535   TRIG 116 08/03/2015 0535   TRIG 121 11/01/2006 1056   HDL 34* 08/03/2015 0535   CHOLHDL 5.1 08/03/2015 0535   CHOLHDL 5.0 CALC 11/01/2006 1056   VLDL 23 08/03/2015 0535   LDLCALC 115* 08/03/2015 0535   HgbA1c:  Lab Results  Component Value Date   HGBA1C 5.5 08/03/2015   Urine Drug Screen: No results found for: LABOPIA, COCAINSCRNUR,  LABBENZ, AMPHETMU, THCU, LABBARB    IMAGING  Dg Chest 2 View 08/03/2015    No acute pulmonary process.     Ct Head Wo Contrast 08/02/2015    1. No acute intracranial abnormalities.  2. 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     Mr Jodene Vaughn Head Wo Contrast 08/02/2015    Acute infarction in the left basal ganglia and radiating white matter tracts. No swelling or acute hemorrhage.  Chronic small vessel ischemic changes elsewhere throughout the brain.  Intracranial MR angiography of the large and medium size vessels is negative.      PHYSICAL EXAM Pleasant middle-aged African-American male currently not in distress. . Afebrile. Head is nontraumatic. Neck is supple without bruit.    Cardiac exam no murmur or gallop. Lungs are clear to auscultation. Distal pulses are well felt. Neurological Exam :  Awake alert oriented 3. Speech is slightly nonfluent but clear occasional word hesitancy. No dysarthria. Pupils equal reactive. Fundi were not visualized. Vision acuity and fields seem adequate. Tongue midline. Good cough and gag. Motor system exam no upper or lower extremity drift. Weakness of right grip and intrinsic hand muscles. Fine finger movements are diminished on the right. Orbits left over right upper  extremity. Symmetric lower extremity strength without focal weakness. Sensation is intact. Coordination is slow but accurate on the right. Plantars are downgoing. Gait was not tested.    ASSESSMENT/PLAN Richard Vaughn is a 64 y.o. male with history of hypertension, Barrett's esophagus, GERD and chronic back pain, presenting with speech difficulties and right-sided weakness. He did not receive IV t-PA due to mild deficits.   Stroke:  Non-Dominant  Basal ganglia infarct embolic secondary to small vessel disease.  Resultant  mild right hand weakness(  MRI  Acute infarction in the left basal ganglia and radiating white matter  tracts  MRA  Intracranial MR angiography of the large and medium size vessels is negative.   Carotid Doppler Bilateral: 1-39% ICA stenosis. Vertebral artery flow is antegrade.   2D Echo pending  LDL 115  HgbA1c 5.5VTE prophylaxis - Lovenox Diet Heart Room service appropriate?: Yes; Fluid consistency:: Thin Diet - low sodium heart healthy  no antithrombotic prior to admission, now on aspirin 325 mg orally every day  Patient counseled to be compliant with his antithrombotic medications  Ongoing aggressive stroke risk factor management  Therapy recommendations: Outpatient physical therapy recommended  Disposition: home  Hypertension  Stable  Permissive hypertension (OK if < 220/120) but gradually normalize in 5-7 days  Hyperlipidemia  Home meds:  Pravachol 40 mg daily - patient reports he stopped taking this medication.  LDL 115, goal < 70  Continue Pravachol  Continue statin at discharge    Other Stroke Risk Factors  Advanced age  Cigarette smoker, advised to stop smoking  Obesity, Body mass index is 35.03 kg/(m^2).   Hx stroke/TIA  Family hx stroke (father)   Other Active Problems  Medical noncompliance  Hospital day # 2   I have personally examined this patient, reviewed notes, independently viewed imaging studies, participated in medical decision making and plan of care. I have made any additions or clarifications directly to the above note.  Marland Kitchen He presented with right face and arm weakness due to left basal ganglia infarct etiology likely small vessel disease.  He needs ongoing stroke evaluation and aggressive risk factor modification. Continue aspirin for stroke prevention and patient counseled to quit smoking and be compliant with his medications. Likely discharge home today after echo results. Follow-up as an outpatient in stroke clinic in 2 months  Antony Contras, MD Medical Director [place] Pager: [phone] 08/04/2015 1:51  PM     To contact Stroke Continuity [facility], please refer to [email or web address] After hours, contact General Neurology

## 2015-08-04 NOTE — Evaluation (Signed)
Speech Language Pathology Evaluation Patient Details Name: Richard Vaughn MRN: [id number] DOB: October 08, 1951 Today's Date: 08/04/2015 Time: 9935-7017 SLP Time Calculation (min) (ACUTE ONLY): 23 min  Problem List:  Patient Active Problem List   Diagnosis Date Noted  . CVA (cerebral infarction) 08/02/2015  . Acute hyperglycemia 08/02/2015  . Essential hypertension 12/27/2014  . Preventative health care 08/07/2013  . Epistaxis 07/28/2012  . HYPERGLYCEMIA 10/02/2010  . NEPHROLITHIASIS 05/23/2009  . BARRETTS ESOPHAGUS 02/20/2009  . Gastroparesis 02/20/2009  . RADICULOPATHY 10/21/2008  . GERD 10/08/2008  . ESOPHAGITIS 08/15/2008  . ESOPHAGEAL STRICTURE 08/15/2008  . GASTRITIS, ACUTE 08/15/2008  . RENAL CYST 08/14/2008  . TOBACCO ABUSE 08/08/2008  . HEMATURIA, MICROSCOPIC, HX OF 08/08/2008  . PULMONARY NODULE 01/10/2008   Past Medical History:  Past Medical History  Diagnosis Date  . GERD (gastroesophageal reflux disease)   . Stricture and stenosis of esophagus   . Gastritis     Mild  . Barrett's esophagus   . Depression   . Gastroparesis   . Hyperglycemia   . Renal cyst   . Nephrolithiasis   . Chronic back pain    Past Surgical History:  Past Surgical History  Procedure Laterality Date  . Lumbar disc surgery  2005, 2010    replaced L4 and L5  . Cervical disc surgery  20159   HPI:  64 year old male with h/o chronic back pain, GERD, Hypertension, esophageal stricture, presented with right sided weakness and dysarthria. MRI revealed acute infarction in the left basal ganglia and radiating whitematter tracts.   Assessment / Plan / Recommendation Clinical Impression  Pt scored 24/30 on the MoCA (<26 indicative of impairment), with difficulty noted in the areas of storage of new information, selective attention, working memory, and complex verbal problem solving. His speech is marked by slow rate and mildly imprecise articulation. Pt seems to have good awareness and insight  into his aforementioned deficits. Recommend f/u OP SLP to maximize functional independence.    SLP Assessment  All further Speech Lanaguage Pathology  needs can be addressed in the next venue of care    Follow Up Recommendations  Outpatient SLP    Pertinent Vitals/Pain Pain Assessment: No/denies pain   SLP Goals  Patient/Family Stated Goal: pt wants to get as back to normal as he can  SLP Evaluation Prior Functioning  Cognitive/Linguistic Baseline: Within functional limits Type of Home: House  Lives With: Family Available Help at Discharge: Family Vocation: Retired   Associate Professor  Overall Cognitive Status: Impaired/Different from baseline Arousal/Alertness: Awake/alert Orientation Level: Oriented X4 Attention: Sustained;Selective Sustained Attention: Appears intact Selective Attention: Impaired Selective Attention Impairment: Verbal complex Memory: Impaired Memory Impairment: Storage deficit Awareness: Appears intact Problem Solving: Impaired Problem Solving Impairment: Verbal complex Safety/Judgment: Appears intact    Comprehension  Auditory Comprehension Overall Auditory Comprehension: Appears within functional limits for tasks assessed    Expression Expression Primary Mode of Expression: Verbal Verbal Expression Overall Verbal Expression: Appears within functional limits for tasks assessed   Oral / Motor Motor Speech Overall Motor Speech: Impaired Respiration: Within functional limits Phonation: Normal Resonance: Within functional limits Articulation: Impaired Level of Impairment: Conversation Intelligibility: Intelligible    Germain Osgood, M.A. CCC-SLP [phone]  Germain Osgood 08/04/2015, 4:42 PM

## 2015-08-04 NOTE — Progress Notes (Signed)
Occupational Therapy Evaluation Patient Details Name: Richard Vaughn MRN: [id number] DOB: Aug 22, 1951 Today's Date: 08/04/2015    History of Present Illness 64 year old male with h/o chronic back pain, GERD, Hypertension, esophageal stricture, presented with right sided weakness and dysarthria. 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 Pt presents with RUE fine motor and coordination deficits. Recommend pt continue with OT in the neuro outpt setting to facilitate return to PLOF, including driving (pt's goal).    Follow Up Recommendations  Outpatient OT;Supervision/Assistance - 24 hour (neuro outpt)    Equipment Recommendations  Tub/shower seat    Recommendations for Other Services       Precautions / Restrictions Precautions Precautions: Fall Precaution Comments: R LE weakness, aphasia Restrictions Weight Bearing Restrictions: No      Mobility Bed Mobility Overal bed mobility: Independent             General bed mobility comments: no physical assist required  Transfers Overall transfer level: Modified independent Equipment used: None             General transfer comment: increased time to come to standing, no physical assist required    Balance Overall balance assessment: No apparent balance deficits (not formally assessed)                                    ADL Overall ADL's : Needs assistance/impaired                                     Functional mobility during ADLs: Min guard General ADL Comments: overall set up /supervision with ADL. Recommend pt use showerseat when bathing due to RLE weakness and pt's feeling of boing unsteady. Discussed home safety and need to refrain from use of power tools at this time and to refrain from drivin at this time.      Vision Vision Assessment?: No apparent visual  deficits Additional Comments: will further assess   Perception     Praxis Praxis Praxis tested?: Within functional limits    Pertinent Vitals/Pain Pain Assessment: 0-10 Pain Score: 5  Pain Location: headache Pain Descriptors / Indicators: Aching Pain Intervention(s): Limited activity within patient's tolerance;Monitored during session     Hand Dominance Right   Extremity/Trunk Assessment Upper Extremity Assessment Upper Extremity Assessment: RUE deficits/detail RUE Deficits / Details: strength overall funcitonal. Deficits noted with fine motor controla nd BUE integrated coordination RUE Sensation:  (states overall feels normal) RUE Coordination: decreased fine motor   LCervical / Trunk Assessment Cervical / Trunk Assessment: Normal   Communication     Cognition Arousal/Alertness: Awake/alert Behavior During Therapy: WFL for tasks assessed/performed Overall Cognitive Status: Within Functional Limits for tasks assessed                     General Comments       Exercises Exercises: General Lower Extremity     Shoulder Instructions      Home Living Family/patient expects to be discharged to:: Private residence Living Arrangements: Spouse/significant other Available Help at Discharge: Family Type of Home: House Home Access: Stairs to enter Technical brewer of Steps: 4   Home Layout: Able to live on main level with bedroom/bathroom     Bathroom  Shower/Tub: Occupational psychologist: Handicapped height Bathroom Accessibility: Yes How Accessible: Accessible via walker Home Equipment: Walker - 2 wheels          Prior Functioning/Environment Level of Independence: Independent             OT Diagnosis: Generalized weakness;Other (comment) (coordination deficits)   OT Problem List: Decreased strength;Decreased coordination;Impaired UE functional use   OT Treatment/Interventions:      OT Goals(Current goals can be found in the  care plan section) Acute Rehab OT Goals Patient Stated Goal: to be able to drive OT Goal Formulation: All assessment and education complete, DC therapy (further OT needed in outpt setting)  OT Frequency:     Barriers to D/C:            Co-evaluation              End of Session Nurse Communication: Mobility status  Activity Tolerance: Patient tolerated treatment well Patient left: in bed;with call bell/phone within reach   Time: 1025-1040 OT Time Calculation (min): 15 min Charges:  OT General Charges $OT Visit: 1 Procedure OT Evaluation $Initial OT Evaluation Tier I: 1 Procedure G-Codes:    WARD,HILLARY Aug 14, 2015, 11:10 AM   Maurie Boettcher, OTR/L  [phone] 08-14-2015

## 2015-08-04 NOTE — Progress Notes (Signed)
Echocardiogram 2D Echocardiogram has been performed.  Richard Vaughn 08/04/2015, 11:20 AM

## 2015-08-04 NOTE — Discharge Summary (Signed)
Physician Discharge Summary  Richard Vaughn:[id number] DOB: 11-Feb-1951 DOA: 08/02/2015  PCP: Drema Pry, DO  Admit date: 08/02/2015 Discharge date: 08/04/2015  Time spent: 40 minutes  Recommendations for Outpatient Follow-up:  1. Follow-up with primary care physician in one week, with Dr. Leonie Man in 3 weeks. 2. Outpatient PT/OT/SLP  Discharge Diagnoses:  Principal Problem:   CVA (cerebral infarction) Active Problems:   TOBACCO ABUSE   ESOPHAGEAL STRICTURE   GERD   Gastroparesis   Essential hypertension   Acute hyperglycemia   Dysarthria   Discharge Condition: Stable  Diet recommendation: Heart healthy  Filed Weights   08/02/15 2036  Weight: 123.8 kg (272 lb 14.9 oz)    History of present illness:  This is a 64 yo patient history of hypertension, HLD, chronic LBP, ongoing tobacco abuse, history of gastroparesis with resultant GERD and Barrett's esophagitis and esophageal stricture, depression and history of renal calculi who presented with difficulty speaking and mild right-sided weakness. According to documentation in the medical record patient was last known well around 3:30 AM. When he woke up about 8:30 AM he initially noticed that he was having some trouble speaking and his wife noticed that his speech was slurred. He was "stumbling around" as well but initially attributed this to an expected finding upon initially arising. When the gait imbalance did not resolve patient began to notice that he was having some numbness in the arm and leg. Primarily he noticed numbness in his mouth and and some difficulty word finding. He initially presented to the [facility] urgent care center and due to concerns of a code stroke was transported to the main emergency department.  Upon arrival to the ER code stroke was initiated. He was of a weighted by neurology with an NIH stroke score of 1. Stat CT of the head did not reveal any acute changes but did reveal an old left basal  ganglia lacunar infarction as well as deep white matter infarcts. Patient with no prior documented history of stroke or TIA. Unfortunately he cannot undergo tPA since he presented outside the window for treatment. When initially evaluated by the neurologist he was found to have mild drift of the right lower extremity, previously described dysarthria/? Aphasia had resolved and no other acute deficits were noted. He was afebrile although he was a bit hypertensive with a blood pressure reading of 189/106, pulse was 72, respirations 16, room air saturations 97% or toward data unremarkable except for mild hyperglycemia. Troponin was negative, lactic acid was negative and serum alcohol level was less than 5. EKG revealed sinus rhythm without any acute ischemic changes  Hospital Course:     CVA  -Presented with slurred speech -MRI of the brain showed acute infarction involving the left basal ganglia and radiating white matter tracts. -Carotid duplex and echocardiogram no evidence of gross abnormalities. -Hemoglobin A1c is 5.5 and lipid panel showed LDL of 1:15 -PT/OT/SLP, recommended same services to be done as outpatient. -Passed bedside swallow eval also proceed with heart healthy diet -Neurology consulted, aspirin started as antiplatelets.   Essential hypertension -Initial blood pressure poorly controlled -We'll allow permissive hypertension and utilize prn Apresoline for blood pressure readings greater than or equal to 638 systolic -Was on Cozaar prior to admission   ESOPHAGEAL STRICTURE/GERD/Gastroparesis -Has issues with recurrent episodic emesis with last event about 1 week ago   TOBACCO ABUSE -Nicotine patch   Acute hyperglycemia -Likely from acute distress, that was non-fasting BMP -Hemoglobin A1c is 5.5   Chronic  low back pain -Continue preadmission [facility] daily   HLD -Total cholesterol is 172, HDL 34 and LDL is 115, reportedly he was not taking his Pravachol. -Continue  Pravachol, follow-up with PCP as outpatient. Needs FLP follow-up in 3 month   Procedures:  2-D echocardiogram and carotid duplex did not show evidence of any stroke sources  Consultations:  Neurology  Discharge Exam: Filed Vitals:   08/04/15 0646  BP: 125/87  Pulse: 65  Temp: 98 F (36.7 C)  Resp: 18   General: Alert and awake, oriented x3, not in any acute distress. HEENT: anicteric sclera, pupils reactive to light and accommodation, EOMI CVS: S1-S2 clear, no murmur rubs or gallops Chest: clear to auscultation bilaterally, no wheezing, rales or rhonchi Abdomen: soft nontender, nondistended, normal bowel sounds, no organomegaly Extremities: no cyanosis, clubbing or edema noted bilaterally Neuro: Cranial nerves II-XII intact, no focal neurological deficits  Discharge Instructions   Discharge Instructions    Ambulatory referral to Neurology    Complete by:  As directed   Stroke office f/u in 2 month     Diet - low sodium heart healthy    Complete by:  As directed      Increase activity slowly    Complete by:  As directed           Current Discharge Medication List    START taking these medications   Details  aspirin 325 MG tablet Take 1 tablet (325 mg total) by mouth daily.      CONTINUE these medications which have NOT CHANGED   Details  losartan (COZAAR) 25 MG tablet TAKE 1 TABLET BY MOUTH EVERY DAY Qty: 90 tablet, Refills: 2    NEXIUM 40 MG capsule Take 1 capsule (40 mg total) by mouth daily. Qty: 90 capsule, Refills: 3    ondansetron (ZOFRAN) 8 MG tablet Take 1 tablet by mouth as needed for nausea.  Refills: 5    oxymorphone (OPANA ER) 20 MG 12 hr tablet Take 20 mg by mouth daily.    pravastatin (PRAVACHOL) 40 MG tablet Take 1 tablet (40 mg total) by mouth daily. Qty: 90 tablet, Refills: 1    terbinafine (LAMISIL) 250 MG tablet Take 1 tablet (250 mg total) by mouth daily. Qty: 30 tablet, Refills: 3   Associated Diagnoses: Onychomycosis      STOP  taking these medications     varenicline (CHANTIX CONTINUING MONTH PAK) 1 MG tablet      varenicline (CHANTIX STARTING MONTH PAK) 0.5 MG X 11 & 1 MG X 42 tablet        No Known Allergies Follow-up Information    Follow up with Drema Pry, DO In 1 week.   Specialty:  Internal Medicine   Contact information:   [place] [address] [phone]       Follow up with SETHI,PRAMOD, MD In 2 weeks.   Specialties:  Neurology, Radiology   Contact information:   [address] [place] [address] [phone]        The results of significant diagnostics from this hospitalization (including imaging, microbiology, ancillary and laboratory) are listed below for reference.    Significant Diagnostic Studies: Dg Chest 2 View  08/03/2015   CLINICAL DATA:  CVA.  EXAM: CHEST  2 VIEW  COMPARISON:  10/16/2008  FINDINGS: The cardiomediastinal contours are normal. Mild bronchial thickening that appears chronic. Pulmonary vasculature is normal. No consolidation, pleural effusion, or pneumothorax. No acute osseous abnormalities are seen.  IMPRESSION: No acute pulmonary process.   Electronically Signed   By: Jeb Levering M.D.   On: 08/03/2015 01:36   Ct Head Wo Contrast  08/02/2015   CLINICAL DATA:  Right arm weakness.  Dysphagia.  EXAM: CT HEAD WITHOUT CONTRAST  TECHNIQUE: Contiguous axial images were obtained from the base of the skull through the vertex without intravenous contrast.  COMPARISON:  None.  FINDINGS: The ventricles are normal in size and configuration. There is a cavum septum pellucidum, an incidental developmental anomaly.  There are no parenchymal masses or mass effect. Well-defined hypoattenuation is noted extending from the left basal ganglia along the anterior limb of the left internal capsule to the left centrum semiovale consistent with an old infarct. There is another probable old deep white matter infarct just above the anterior horn of the  left lateral ventricle. Patchy areas of white matter hypoattenuation are noted bilaterally consistent with moderate chronic microvascular ischemic change.  There is no evidence of a recent cortical infarct.  There are no extra-axial masses or abnormal fluid collections.  There is no intracranial hemorrhage.  Visualized sinuses and mastoid air cells are clear.  IMPRESSION: 1. No acute intracranial abnormalities. 2. 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  These results were called by telephone at the time of interpretation on 08/02/2015 at 2:18 pm to Dr. Nicole Kindred, who verbally acknowledged these results.   Electronically Signed   By: Lajean Manes M.D.   On: 08/02/2015 14:18   Mr [name] Head Wo Contrast  08/02/2015   CLINICAL DATA:  Acute presentation with difficulty speaking and right-sided weakness.  EXAM: MRI HEAD WITHOUT CONTRAST  MRA HEAD WITHOUT CONTRAST  TECHNIQUE: Multiplanar, multiecho pulse sequences of the brain and surrounding structures were obtained without intravenous contrast. Angiographic images of the head were obtained using MRA technique without contrast.  COMPARISON:  08/02/2015 CT  FINDINGS: MRI HEAD FINDINGS  Diffusion imaging shows acute infarction in the left basal ganglia and radiating white matter tracts. No other acute infarction.  There chronic small-vessel ischemic changes of the pons. No focal cerebellar insult. There are old small vessel infarctions affecting the thalami and basal ganglia and patchy and confluent chronic small vessel ischemic changes throughout the cerebral hemispheric white matter. No large vessel territory infarction. No mass lesion, acute hemorrhage, hydrocephalus or extra-axial collection. There is hemosiderin deposition associated with a few of the old white matter infarctions. No pituitary mass. No inflammatory sinus disease. There is a mastoid effusion on the left.  MRA HEAD FINDINGS  Both internal carotid  arteries are widely patent into the brain. No siphon stenosis. The anterior and middle cerebral vessels are patent without proximal stenosis, aneurysm or vascular malformation.  Both vertebral arteries are patent with the right being dominant. No basilar stenosis. Posterior circulation branch vessels are patent.  IMPRESSION: Acute infarction in the left basal ganglia and radiating white matter tracts. No swelling or acute hemorrhage.  Chronic small vessel ischemic changes elsewhere throughout the brain.  Intracranial MR angiography of the large and medium size vessels is negative.   Electronically Signed   By: Nelson Chimes M.D.   On: 08/02/2015 16:56   Mr Brain Wo Contrast  08/02/2015   CLINICAL DATA:  Acute presentation with difficulty speaking and right-sided weakness.  EXAM: MRI HEAD WITHOUT CONTRAST  MRA HEAD WITHOUT CONTRAST  TECHNIQUE: Multiplanar, multiecho pulse sequences of the brain and surrounding structures were obtained without intravenous contrast. Angiographic images of  the head were obtained using MRA technique without contrast.  COMPARISON:  08/02/2015 CT  FINDINGS: MRI HEAD FINDINGS  Diffusion imaging shows acute infarction in the left basal ganglia and radiating white matter tracts. No other acute infarction.  There chronic small-vessel ischemic changes of the pons. No focal cerebellar insult. There are old small vessel infarctions affecting the thalami and basal ganglia and patchy and confluent chronic small vessel ischemic changes throughout the cerebral hemispheric white matter. No large vessel territory infarction. No mass lesion, acute hemorrhage, hydrocephalus or extra-axial collection. There is hemosiderin deposition associated with a few of the old white matter infarctions. No pituitary mass. No inflammatory sinus disease. There is a mastoid effusion on the left.  MRA HEAD FINDINGS  Both internal carotid arteries are widely patent into the brain. No siphon stenosis. The anterior and  middle cerebral vessels are patent without proximal stenosis, aneurysm or vascular malformation.  Both vertebral arteries are patent with the right being dominant. No basilar stenosis. Posterior circulation branch vessels are patent.  IMPRESSION: Acute infarction in the left basal ganglia and radiating white matter tracts. No swelling or acute hemorrhage.  Chronic small vessel ischemic changes elsewhere throughout the brain.  Intracranial MR angiography of the large and medium size vessels is negative.   Electronically Signed   By: Nelson Chimes M.D.   On: 08/02/2015 16:56    Microbiology: No results found for this or any previous visit (from the past 240 hour(s)).   Labs: Basic Metabolic Panel:  Recent Labs Lab 08/02/15 1413 08/03/15 0533  NA 138  141 140  K 4.3  4.2 3.5  CL 105  103 105  CO2 28 27  GLUCOSE 127*  117* 100*  BUN 7  8 6   CREATININE 1.12  1.10 1.00  CALCIUM 9.3 9.4   Liver Function Tests:  Recent Labs Lab 08/02/15 1413  AST 19  ALT 22  ALKPHOS 78  BILITOT 0.9  PROT 7.1  ALBUMIN 4.1   No results for input(s): LIPASE, AMYLASE in the last 168 hours. No results for input(s): AMMONIA in the last 168 hours. CBC:  Recent Labs Lab 08/02/15 1413 08/03/15 0533  WBC 6.9 9.7  NEUTROABS 4.4  --   HGB 16.0  17.0 15.9  HCT 45.6  50.0 46.0  MCV 91.2 91.6  PLT 180 167   Cardiac Enzymes: No results for input(s): CKTOTAL, CKMB, CKMBINDEX, TROPONINI in the last 168 hours. BNP: BNP (last 3 results) No results for input(s): BNP in the last 8760 hours.  ProBNP (last 3 results) No results for input(s): PROBNP in the last 8760 hours.  CBG:  Recent Labs Lab 08/02/15 1414  GLUCAP 102*       Signed:  ELMAHI,MUTAZ A  [facility] 08/04/2015, 5:05 PM

## 2015-08-07 ENCOUNTER — Ambulatory Visit: Payer: Federal, State, Local not specified - PPO

## 2015-08-07 ENCOUNTER — Encounter: Payer: Self-pay | Admitting: Physical Therapy

## 2015-08-07 ENCOUNTER — Telehealth: Payer: Self-pay | Admitting: Internal Medicine

## 2015-08-07 ENCOUNTER — Ambulatory Visit: Payer: Federal, State, Local not specified - PPO | Admitting: Occupational Therapy

## 2015-08-07 ENCOUNTER — Ambulatory Visit: Payer: Federal, State, Local not specified - PPO | Attending: Internal Medicine | Admitting: Physical Therapy

## 2015-08-07 ENCOUNTER — Encounter: Payer: Self-pay | Admitting: Occupational Therapy

## 2015-08-07 DIAGNOSIS — I6931 Cognitive deficits following cerebral infarction: Secondary | ICD-10-CM | POA: Insufficient documentation

## 2015-08-07 DIAGNOSIS — R4701 Aphasia: Secondary | ICD-10-CM | POA: Diagnosis present

## 2015-08-07 DIAGNOSIS — IMO0002 Reserved for concepts with insufficient information to code with codable children: Secondary | ICD-10-CM

## 2015-08-07 DIAGNOSIS — R279 Unspecified lack of coordination: Secondary | ICD-10-CM | POA: Diagnosis present

## 2015-08-07 DIAGNOSIS — I69319 Unspecified symptoms and signs involving cognitive functions following cerebral infarction: Secondary | ICD-10-CM

## 2015-08-07 DIAGNOSIS — R269 Unspecified abnormalities of gait and mobility: Secondary | ICD-10-CM | POA: Diagnosis present

## 2015-08-07 DIAGNOSIS — R531 Weakness: Secondary | ICD-10-CM

## 2015-08-07 DIAGNOSIS — I69359 Hemiplegia and hemiparesis following cerebral infarction affecting unspecified side: Secondary | ICD-10-CM

## 2015-08-07 DIAGNOSIS — G8190 Hemiplegia, unspecified affecting unspecified side: Secondary | ICD-10-CM | POA: Insufficient documentation

## 2015-08-07 DIAGNOSIS — I6980 Unspecified sequelae of other cerebrovascular disease: Secondary | ICD-10-CM | POA: Insufficient documentation

## 2015-08-07 DIAGNOSIS — R6889 Other general symptoms and signs: Secondary | ICD-10-CM

## 2015-08-07 DIAGNOSIS — I69922 Dysarthria following unspecified cerebrovascular disease: Secondary | ICD-10-CM | POA: Diagnosis present

## 2015-08-07 DIAGNOSIS — I639 Cerebral infarction, unspecified: Secondary | ICD-10-CM

## 2015-08-07 DIAGNOSIS — Z7409 Other reduced mobility: Secondary | ICD-10-CM | POA: Diagnosis present

## 2015-08-07 DIAGNOSIS — G8191 Hemiplegia, unspecified affecting right dominant side: Secondary | ICD-10-CM

## 2015-08-07 NOTE — Telephone Encounter (Signed)
Richard Vaughn at [facility] neuro request an order for outpt physical therapy and speech.  Pt had a stroke. And they would like orders put into [facility].  Dr Karleen Hampshire ordered from the hospital, but must come from primary.  Phone # [phone]

## 2015-08-07 NOTE — Telephone Encounter (Signed)
[place] for order for outpt physical therapy

## 2015-08-07 NOTE — Patient Instructions (Signed)
Speech Exercises  Do 2 times, 3 times a day   Call the cat "Buttercup"  A calendar of [place], [place]  Four floors to cover  Yellow oil ointment  Fellow lovers of felines  Catastrophe in [place]' plums  The church's chimes chimed  Telling time 'til eleven  Five valve levers  Keep the gate closed  Go see that guy  Fat cows give milk  [employer] Gophers  Fat frogs flip freely  [employer] into bed  Get that game to Greg  Thick thistles stick together  Cinnamon aluminum linoleum  Black bugs blood  Lovely lemon linament  Red leather, yellow leather   Big grocery buggy     Purple baby carriage  [facility]  Proper copper coffee pot  Ripe purple cabbage  Three free throws  [employer] tackled   [employer] dipped the dessert   [facility] that [employer]  Shirts shrink, shells shouldn't  [place] [employer]  Take the tackle box  File the flash message  Give me five flapjacks  Fundamental relatives  Dye the pets purple  Talking [place] time after time  Dark chocolate chunks  Political landscape of the kingdom  Estate manager/land agent genius  We played yo-yos yesterday

## 2015-08-07 NOTE — Therapy (Signed)
[facility] [address] [place], [place], [address] Phone: [phone]   Fax:  [phone]  Occupational Therapy Evaluation  Patient Details  Name: Richard Vaughn MRN: [id number] Date of Birth: 21-Oct-1951 Referring Provider:  Rosine Abe, DO  Encounter Date: 08/07/2015      OT End of Session - 08/07/15 1730    Visit Number 1   Number of Visits 17   Date for OT Re-Evaluation 10/06/15   Authorization Type BCBS Federal, 75 visits combined, no auth required   OT Start Time 0940   OT Stop Time 1015   OT Time Calculation (min) 35 min   Activity Tolerance Patient tolerated treatment well   Behavior During Therapy [facility] for tasks assessed/performed      Past Medical History  Diagnosis Date  . GERD (gastroesophageal reflux disease)   . Stricture and stenosis of esophagus   . Gastritis     Mild  . Barrett's esophagus   . Depression   . Gastroparesis   . Hyperglycemia   . Renal cyst   . Nephrolithiasis   . Chronic back pain     Past Surgical History  Procedure Laterality Date  . Lumbar disc surgery  2005, 2010    replaced L4 and L5  . Cervical disc surgery  2012    There were no vitals filed for this visit.  Visit Diagnosis:  Right hemiparesis  Lack of coordination due to stroke  Cognitive deficits following cerebral infarction  Decreased activity tolerance      Subjective Assessment - 08/07/15 1445    Subjective  Pt reports changes in walking, R hand, and speech   Patient is accompained by: Family member  wife   Pertinent History CVA 08/02/15   Patient Stated Goals improve writing, hand use, eating   Currently in Pain? Yes   Pain Score 4    Pain Location Head   Pain Descriptors / Indicators Aching   Pain Type Acute pain   Pain Frequency Intermittent   Aggravating Factors  unknown   Pain Relieving Factors meds   Effect of Pain on Daily Activities OT will monitor, but will not be directly  addressing due to nature/location           [facility] OT Assessment - 08/07/15 0944    Assessment   Diagnosis CVA   Onset Date 08/02/15   Precautions   Precautions --  no driving   Balance Screen   Has the patient fallen in the past 6 months No   Home  Environment   Lives With Spouse  62 y.o. and 54 y.o. 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 mod I    Lower Body Dressing --  mod I, able to tie shoes   Toilet Tranfer Modified independent   [place] Transfer --  Research officer, trade union seat with back  shower stall   IADL   Prior Level of Function Shopping  performed with wife   Shopping --  not performing   Prior Level of Function Light Housekeeping shared with wife   Light Housekeeping --  has not performed   Prior Level of Function Meal Prep shared with wife   Meal Prep --  has not performed   Prior Level of Function Community Mobility independent   Community Mobility Relies on family or friends for transportation   Medication Management Is responsible for taking medication in correct dosages at correct time   Prior Level of Function Financial Management independent   Financial Management --  anticipates difficulty writing only   Mobility   Mobility Status Independent   Written Expression   Dominant Hand Right   Handwriting Increased time;90% legible   Vision - History   Baseline Vision Wears glasses  for distance only   Visual History --  nearsighted   Additional Comments Pt denies change.  Will assess in function context prn.   Vision Assessment   Vision Assessment Vision not tested   Activity Tolerance   Activity Tolerance --  pt reports fatigues after 5-10 min and needs break   Cognition   Overall Cognitive Status Cognition to be further assessed in functional context PRN  family/pt denies deficits   Attention --  spelled "world" backwards   Executive Function --  counted backwards by 7's with 4/5 accurate, incr time   Bradyphrenia --   Observation/Other Assessments   Other Surveys  Select   Physical Performance Test   Yes   Simulated Eating Time (seconds) 14.72   Sensation   Light Touch Appears Intact  per pt report/no numbness or tingling   Coordination   Gross Motor Movements are Fluid and Coordinated No  mild difficulty   Fine Motor Movements are Fluid and Coordinated No  mild difficulty   Finger Nose Finger Test min decr speed/accuracy   9 Hole Peg Test Right;Left   Right 9 Hole Peg Test 34.25sec   Left 9 Hole Peg Test 27.41sec   Box and Blocks R-35blocks, L-41 blocks   Praxis   Praxis Impaired   Praxis-Other Comments ? slower movements in general, but followed directions accurately   ROM / Strength   AROM / PROM / Strength AROM;Strength   AROM   Overall AROM  Within functional limits for tasks performed  slower movements   Strength   Overall Strength Comments BUE proximal strength grossly 5/5   Hand Function   Right Hand Grip (lbs) 96   Left Hand Grip (lbs) 115                           OT Short Term Goals - 08/07/15 1735    OT SHORT TERM GOAL #1   Title Pt will be independent with initial HEP.--check STGs 09/06/15   Time 4   Period Weeks   Status New   OT SHORT TERM GOAL #2   Title Pt will improve coordination to be able to eat without spills and cut food mod I.   Time 4   Period Weeks   Status New   OT SHORT TERM GOAL #3    Title Pt will report ability to perform grooming tasks with RUE without difficulty.   Time 4   Period Weeks   Status New   OT SHORT TERM GOAL #4   Title Pt will be able to write 2-3 sentences with 100% legibility.   Time 4   Period Weeks  Status New   OT SHORT TERM GOAL #5   Title Pt will improve coordination for ADLs as shown by improving score on box and blocks test by at least 6 with RUE.   Baseline R-35, L-41 blocks   Time 4   Period Weeks   Status New           OT Long Term Goals - 08/07/15 2032    OT LONG TERM GOAL #1   Title Pt will be independent with updated HEP.--check LTGs 10/06/15   Time 8   Period Weeks   Status New   OT LONG TERM GOAL #2   Title Pt will be able to perform simple cooking/home maintenance tasks mod I.   Time 8   Period Weeks   Status New   OT LONG TERM GOAL #3   Title Pt will be able to perform financial management tasks without assist including writing.   Time 8   Period Weeks   Status New   OT LONG TERM GOAL #4   Title Pt will improve coordination as shown by improving time on 9-hole peg test by at least 8sec with RUE.   Baseline R-34.25sec, L-27.41sec   Time 8   Period Weeks   Status New   OT LONG TERM GOAL #5   Title Pt will verbalize understanding of CVA risk factors and warning signs/symptoms.   Time 8   Period Weeks   Status New   Long Term Additional Goals   Additional Long Term Goals Yes   OT LONG TERM GOAL #6   Title Pt will be able to perform IADLs/simulated IADL tasks for at least 51min without rest break.               Plan - 08/07/15 1731    Clinical Impression Statement Pt is 64 y.o. male s/p CVA 08/01/15.  Pt presents today with R dominant hemiparesis, decreased coordination, mild decr strength, decr activity tolerance, and cognitive deficits affecting ADL/IADL performance and RUE functional use.  Pt would benefit from occupational therapy to address these deficits in order to improve R dominant UE  functional use and ADL perfomance.   Pt will benefit from skilled therapeutic intervention in order to improve on the following deficits (Retired) Decreased activity tolerance;Decreased cognition;Decreased strength;Impaired UE functional use;Decreased knowledge of use of DME;Decreased endurance;Decreased coordination   Rehab Potential Good   OT Frequency 2x / week   OT Duration 8 weeks  +eval   OT Treatment/Interventions Self-care/ADL training;Moist Heat;Fluidtherapy;Cognitive remediation/compensation;Therapeutic activities;DME and/or AE instruction;Cryotherapy;Energy conservation;Therapeutic exercises;Splinting;Manual Therapy;Neuromuscular education;Ultrasound;Therapeutic exercise;Functional Mobility Training;Patient/family education   Plan initiate HEP for coordination   Consulted and Agree with Plan of Care Patient;Family member/caregiver   Family Member Consulted wife        Problem List Patient Active Problem List   Diagnosis Date Noted  . Dysarthria   . CVA (cerebral infarction) 08/02/2015  . Acute hyperglycemia 08/02/2015  . Essential hypertension 12/27/2014  . Preventative health care 08/07/2013  . Epistaxis 07/28/2012  . HYPERGLYCEMIA 10/02/2010  . NEPHROLITHIASIS 05/23/2009  . BARRETTS ESOPHAGUS 02/20/2009  . Gastroparesis 02/20/2009  . RADICULOPATHY 10/21/2008  . GERD 10/08/2008  . ESOPHAGITIS 08/15/2008  . ESOPHAGEAL STRICTURE 08/15/2008  . GASTRITIS, ACUTE 08/15/2008  . RENAL CYST 08/14/2008  . TOBACCO ABUSE 08/08/2008  . HEMATURIA, MICROSCOPIC, HX OF 08/08/2008  . PULMONARY NODULE 01/10/2008    [facility] 08/07/2015, 8:41 PM  [facility] [address] [place], [place], [address] Phone: [phone]  Fax:  [place], OTR/L 08/07/2015 8:41 PM

## 2015-08-07 NOTE — Therapy (Signed)
[facility] [address] , [place], [address] Phone: [phone]   Fax:  [phone]  Speech Language Pathology Evaluation  Patient Details  Name: Richard Vaughn MRN: [id number] Date of Birth: July 23, 1951 Referring Provider:  Rosine Abe, DO  Encounter Date: 08/07/2015      End of Session - 08/07/15 1104    Visit Number 1   Number of Visits 17   Date for SLP Re-Evaluation 10/07/15   SLP Start Time 66   SLP Stop Time  1050   SLP Time Calculation (min) 30 min   Activity Tolerance Patient tolerated treatment well      Past Medical History  Diagnosis Date  . GERD (gastroesophageal reflux disease)   . Stricture and stenosis of esophagus   . Gastritis     Mild  . Barrett's esophagus   . Depression   . Gastroparesis   . Hyperglycemia   . Renal cyst   . Nephrolithiasis   . Chronic back pain     Past Surgical History  Procedure Laterality Date  . Lumbar disc surgery  2005, 2010    replaced L4 and L5  . Cervical disc surgery  2012    There were no vitals filed for this visit.  Visit Diagnosis: Dysarthria due to cerebrovascular accident  Aphasia      Subjective Assessment - 08/07/15 1025    Subjective "I have trouble forming my words."            SLP Evaluation [facility] - 08/07/15 1025    SLP Visit Information   SLP Received On 08/07/15   Onset Date 08-02-15   Medical Diagnosis CVA   Pain Assessment   Currently in Pain? Yes   Pain Score 4    Pain Location Head   Pain Orientation Left   Pain Type Acute pain   Pain Onset 1 to 4 weeks ago   Pain Frequency Intermittent   Pain Relieving Factors meds   General Information   Other Pertinent Information 64 year old male with h/o chronic back pain, GERD, Hypertension, esophageal stricture, presented with right sided weakness and dysarthria. MRI revealed acute infarction in the left basal ganglia and radiating whitematter tracts.   Prior  Functional Status   Cognitive/Linguistic Baseline Within functional limits   Type of Home House    Lives With Family   Vocation Retired   Associate Professor   Overall Cognitive Status Within Functional Limits for tasks assessed   Auditory Comprehension   Overall Auditory Comprehension Appears within functional limits for tasks assessed   Verbal Expression   Overall Verbal Expression Appears within functional limits for tasks assessed   Oral Motor/Sensory Function   Overall Oral Motor/Sensory Function Impaired   Labial ROM Within Functional Limits   Labial Strength Reduced  rt more than lt   Labial Coordination Reduced   Lingual ROM Reduced right;Reduced left   Lingual Symmetry Abnormal symmetry right   Lingual Strength Reduced  rt more than lt   Lingual Coordination Reduced   Facial Strength Within Functional Limits   Motor Speech   Overall Motor Speech Impaired   Phonation Normal   Resonance Within functional limits   Articulation Impaired   Level of Impairment Sentence   Intelligibility Intelligible      SLP derived a HEP for pt to practice compensations for dysarthria. Pt req'd min-mod A to exaggerate speech movements, adn was told to complete twice a day. Very soon SLP will add stressing of  consonants in this program to incr oral muscle strength and improve intelligibility.                   SLP Education - 08/07/15 1103    Education provided Yes   Education Details HEP for compensations for dysarthria/oral strengthening   Person(s) Educated Patient;Spouse   Methods Explanation;Demonstration;Verbal cues   Comprehension Verbalized understanding;Returned demonstration;Verbal cues required          SLP Short Term Goals - 08/07/15 1420    SLP SHORT TERM GOAL #1   Title pt will complete HEP with occasional min A    Time 4   Period Weeks   Status New   SLP SHORT TERM GOAL #2   Title pt will demo compensations for dysarthria in mod complex sentence responses  90%    Time 4   Period Weeks   Status New   SLP SHORT TERM GOAL #3   Title pt will produce 100% intelligible speech in 8 minutes simple conversation   Time 4   Period Weeks   Status New          SLP Long Term Goals - 08/07/15 1421    SLP LONG TERM GOAL #1   Title pt will complete HEP for dysarthria with rare min A   Time 8   Period Weeks   Status New   SLP LONG TERM GOAL #2   Title pt will produce 10 minutes intelligible mod complex conversation with rare min A   Time 8   Period Weeks   Status New          Plan - 08/07/15 1105    Clinical Impression Statement Pt presents with mild dysarthria and reported anomia (further testing needed). Skilled ST necessary to improve pt's clarity of speech and possibly aphasia.   Speech Therapy Frequency 2x / week   Duration --  8 weeks   Treatment/Interventions SLP instruction and feedback;Compensatory strategies;Patient/family education;Oral motor exercises;Functional tasks   Potential to Achieve Goals Good   SLP Home Exercise Plan provided today   Consulted and Agree with Plan of Care Patient        Problem List Patient Active Problem List   Diagnosis Date Noted  . Dysarthria   . CVA (cerebral infarction) 08/02/2015  . Acute hyperglycemia 08/02/2015  . Essential hypertension 12/27/2014  . Preventative health care 08/07/2013  . Epistaxis 07/28/2012  . HYPERGLYCEMIA 10/02/2010  . NEPHROLITHIASIS 05/23/2009  . BARRETTS ESOPHAGUS 02/20/2009  . Gastroparesis 02/20/2009  . RADICULOPATHY 10/21/2008  . GERD 10/08/2008  . ESOPHAGITIS 08/15/2008  . ESOPHAGEAL STRICTURE 08/15/2008  . GASTRITIS, ACUTE 08/15/2008  . RENAL CYST 08/14/2008  . TOBACCO ABUSE 08/08/2008  . HEMATURIA, MICROSCOPIC, HX OF 08/08/2008  . PULMONARY NODULE 01/10/2008    [facility] , MS, [place]  08/07/2015, 2:23 PM  [facility] [address] [place], [place], [address] Phone:  [phone]   Fax:  [phone]

## 2015-08-07 NOTE — Patient Instructions (Signed)
SINGLE LIMB STANCE   Stance: single leg on floor. Raise leg. Hold __10_ seconds. Repeat with other leg. _2 reps per set, _3__ sets per day, _5__ days per week Copyright  VHI. All rights reserved.  Heel Raise: Bilateral (Standing)   Rise on balls of feet. Repeat __10_ times per set. Do _3___ sets per session. Do __3__ sessions per day.  http://orth.exer.us/38   Copyright  VHI. All rights reserved.

## 2015-08-08 NOTE — Telephone Encounter (Signed)
Referral placed.

## 2015-08-10 ENCOUNTER — Encounter: Payer: Self-pay | Admitting: Physical Therapy

## 2015-08-10 NOTE — Therapy (Signed)
[facility] [address] [place], [place], [address] Phone: [phone]   Fax:  [phone]  Physical Therapy Evaluation  Patient Details  Name: Richard Vaughn MRN: [id number] Date of Birth: [birth year] Referring Provider:  Verlee Monte, MD  Encounter Date: 08/07/2015      PT End of Session - 08/10/15 2050    Visit Number 1   Number of Visits 9   Date for PT Re-Evaluation 09/06/15   Authorization Type BCBS Federal   PT Start Time [id number]   PT Stop Time 0930   PT Time Calculation (min) 44 min      Past Medical History  Diagnosis Date  . GERD (gastroesophageal reflux disease)   . Stricture and stenosis of esophagus   . Gastritis     Mild  . Barrett's esophagus   . Depression   . Gastroparesis   . Hyperglycemia   . Renal cyst   . Nephrolithiasis   . Chronic back pain     Past Surgical History  Procedure Laterality Date  . Lumbar disc surgery  2005, 2010    replaced L4 and L5  . Cervical disc surgery  2012    There were no vitals filed for this visit.  Visit Diagnosis:  Abnormality of gait - Plan: PT plan of care cert/re-cert  Hemiparesis due to recent cerebral infarction - Plan: PT plan of care cert/re-cert  Decreased strength, endurance, and mobility - Plan: PT plan of care cert/re-cert          [facility] PT Assessment - 08/10/15 0001    Assessment   Medical Diagnosis CVA   Onset Date/Surgical Date 08/02/15   Precautions   Precautions --  no driving   Balance Screen   Has the patient fallen in the past 6 months No   Has the patient had a decrease in activity level because of a fear of falling?  No   Is the patient reluctant to leave their home because of a fear of falling?  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   Sensation   Light Touch Appears Intact   ROM / Strength   AROM / PROM / Strength AROM;Strength   AROM   Overall AROM  Within functional limits for tasks performed   Ambulation/Gait   Ambulation/Gait Yes   Ambulation/Gait Assistance 6: Modified independent (Device/Increase time)   Ambulation Distance (Feet) 150 Feet   Assistive device None   Gait Pattern Within Functional Limits   Ambulation Surface Level;Indoor   Gait velocity 3.01  10.9 secs   Stairs Yes   Stairs Assistance 5: Supervision   Stair Management Technique One rail Right   Number of Stairs 4   Height of Stairs 6   Berg Balance Test   Sit to Stand Able to stand without using hands and stabilize independently   Standing Unsupported Able to stand safely 2 minutes   Sitting with Back Unsupported but Feet Supported on Floor or Stool Able  to sit safely and securely 2 minutes   Stand to Sit Sits safely with minimal use of hands   Transfers Able to transfer safely, minor use of hands   Standing Unsupported with Eyes Closed Able to stand 10 seconds with supervision   Standing Ubsupported with Feet Together Able to place feet together independently and stand for 1 minute with supervision   From Standing, Reach Forward with Outstretched Arm Can reach confidently >25 cm (10")   From Standing Position, Pick up Object from Floor Able to pick up shoe, needs supervision   From Standing Position, Turn to Look Behind Over each Shoulder Turn sideways only but maintains balance   Turn 360 Degrees Able to turn 360 degrees safely but slowly   Standing Unsupported, Alternately Place Feet on Step/Stool Able to  complete >2 steps/needs minimal assist   Standing Unsupported, One Foot in Front Able to take small step independently and hold 30 seconds   Standing on One Leg Tries to lift leg/unable to hold 3 seconds but remains standing independently   Total Score 41   Timed Up and Go Test   Normal TUG (seconds) 13.8                                PT Long Term Goals - 08/10/15 2056    PT LONG TERM GOAL #1   Title Incr. Berg score to >/= 46/56 to decr. fall risk.  (09-06-15)   Baseline 41/56   Time 4   Period Weeks   Status New   PT LONG TERM GOAL #2   Title Improve TUG score to </= 10.0 secs for reduced fall risk.  (09-06-15)   Baseline 13.8 secs   Time 4   Period Weeks   Status New   PT LONG TERM GOAL #3   Title Incr. gait velocity to >/= 3.7 ft/sec without device for incr. gait efficiency.  (09-06-15)   Baseline 3.01 ft/sec   Time 4   Period Weeks   Status New   PT LONG TERM GOAL #4   Title Independent in HEP for high level balance exercises.  (09-06-15)   Time 4   Period Weeks   Status New               Plan - 08/10/15 2051    Clinical Impression Statement Pt. presents with decr. high level balance skills and dysarthria due to CVA;  pt would benefit from skilled PT to address high level balance and gait deficits and to incr. activity tolerance/endurance   Pt will benefit from skilled therapeutic intervention in order to improve on the following deficits Abnormal gait;Decreased endurance;Decreased activity tolerance;Decreased balance;Decreased mobility;Decreased strength;Decreased coordination   Rehab Potential Good   PT Frequency 2x / week   PT Duration 4 weeks   PT Treatment/Interventions ADLs/Self Care Home Management;Functional mobility training;Stair training;Gait training;Therapeutic activities;Therapeutic exercise;Balance training;Neuromuscular re-education;Patient/family education   PT Next Visit Plan do Dynamic Gait Index; begin HEP - high  level balance acitivities, i.e. single limb and tandem stance   PT Home Exercise Plan see above   Consulted and Agree with Plan of Care Patient;Family member/caregiver   Family Member Consulted wife         Problem List Patient Active Problem List   Diagnosis Date Noted  . Dysarthria   . CVA (cerebral infarction) 08/02/2015  . Acute hyperglycemia 08/02/2015  . Essential hypertension 12/27/2014  . Preventative health care  08/07/2013  . Epistaxis 07/28/2012  . HYPERGLYCEMIA 10/02/2010  . NEPHROLITHIASIS 05/23/2009  . BARRETTS ESOPHAGUS 02/20/2009  . Gastroparesis 02/20/2009  . RADICULOPATHY 10/21/2008  . GERD 10/08/2008  . ESOPHAGITIS 08/15/2008  . ESOPHAGEAL STRICTURE 08/15/2008  . GASTRITIS, ACUTE 08/15/2008  . RENAL CYST 08/14/2008  . TOBACCO ABUSE 08/08/2008  . HEMATURIA, MICROSCOPIC, HX OF 08/08/2008  . PULMONARY NODULE 01/10/2008    Alda Lea, PT 08/10/2015, 9:04 PM  [facility] [address] [place], [place], [address] Phone: [phone]   Fax:  [phone]

## 2015-08-12 ENCOUNTER — Ambulatory Visit (INDEPENDENT_AMBULATORY_CARE_PROVIDER_SITE_OTHER): Payer: Federal, State, Local not specified - PPO | Admitting: Family Medicine

## 2015-08-12 ENCOUNTER — Encounter: Payer: Self-pay | Admitting: Family Medicine

## 2015-08-12 VITALS — BP 110/78 | HR 80 | Temp 98.1°F | Wt 265.9 lb

## 2015-08-12 DIAGNOSIS — I679 Cerebrovascular disease, unspecified: Secondary | ICD-10-CM

## 2015-08-12 DIAGNOSIS — I10 Essential (primary) hypertension: Secondary | ICD-10-CM

## 2015-08-12 NOTE — Progress Notes (Signed)
Subjective:    Patient ID: Richard Vaughn, male    DOB: 1951-11-07, 64 y.o.   MRN: [id number]  HPI   Patient seen for hospital follow-up in the absence of his primary physician. He has long history of nicotine use as well as GERD and hypertension. He presented on 08/02/2015 to ED with dysarthria and mild right-sided weakness. This was noted when he woke up at 8:30 that morning. In the ER, stat CT head did not reveal any acute changes but old left basal ganglia lacunar infarct as well as some deep white matter infarcts. Troponin was negative. Blood pressure initially very elevated. MRI showed acute infarction left basal ganglia. Carotid duplex and echocardiogram unremarkable. A1c 5.5%. LDL 115.  Patient is getting outpatient physical therapy, occupational therapy, speech therapy. His speech is slowly improving. Weakness has essentially resolved. No swallowing difficulties. He had bedside swallow evaluation which was reportedly normal.  He has stopped smoking since his stroke. He has not yet started on statin and seemed somewhat reluctant. He is considering red rice yeast extract. He takes losartan for hypertension. Taking aspirin 325 mg daily.  Past Medical History  Diagnosis Date  . GERD (gastroesophageal reflux disease)   . Stricture and stenosis of esophagus   . Gastritis     Mild  . Barrett's esophagus   . Depression   . Gastroparesis   . Hyperglycemia   . Renal cyst   . Nephrolithiasis   . Chronic back pain    Past Surgical History  Procedure Laterality Date  . Lumbar disc surgery  2005, 2010    replaced L4 and L5  . Cervical disc surgery  2012    reports that he has been smoking Cigarettes.  He has been smoking about 0.80 packs per day. He has never used smokeless tobacco. He reports that he does not drink alcohol or use illicit drugs. family history includes Hypertension in his brother, father, mother, and sister; Liver cancer in his mother; Stroke in his father. There is no  history of Colon cancer, Esophageal cancer, Rectal cancer, or Stomach cancer. No Known Allergies    Review of Systems  Constitutional: Negative for fatigue.  HENT: Negative for trouble swallowing.   Eyes: Negative for visual disturbance.  Respiratory: Negative for cough, chest tightness and shortness of breath.   Cardiovascular: Negative for chest pain, palpitations and leg swelling.  Endocrine: Negative for polydipsia and polyuria.  Neurological: Positive for headaches. Negative for dizziness, syncope, weakness and light-headedness.  Psychiatric/Behavioral: Negative for confusion.       Objective:   Physical Exam  Constitutional: He is oriented to person, place, and time. He appears well-developed and well-nourished. No distress.  Cardiovascular: Normal rate and regular rhythm.   Pulmonary/Chest: Effort normal and breath sounds normal. No respiratory distress. He has no wheezes. He has no rales.  Musculoskeletal: He exhibits no edema.  Neurological: He is alert and oriented to person, place, and time. No cranial nerve deficit.  Full strength upper and lower extremities. No facial droop He still has difficulties occasionally finding the appropriate word. Ambulating without difficulty.  Psychiatric: He has a normal mood and affect.          Assessment & Plan:  #1 acute cerebral infarction involving left basal ganglia. Clinically improving. He is strongly advised to remain on aspirin and start back pravastatin with future lipid ordered for 6 weeks with goal LDL less than 70. Continue losartan. Blood pressure appears to be well controlled. He is strongly  advised to remain off cigarettes. Continue outpatient PT, OT, and speech therapy #2 health maintenance. Flu vaccine advised and patient declines.

## 2015-08-12 NOTE — Progress Notes (Signed)
Pre visit review using our clinic review tool, if applicable. No additional management support is needed unless otherwise documented below in the visit note. 

## 2015-08-12 NOTE — Patient Instructions (Signed)
Stay off cigarettes Make sure you take ALL of your medications regularly START back on the Pravastatin Let's plan on repeat lipids in about 6 weeks.

## 2015-08-14 ENCOUNTER — Ambulatory Visit: Payer: Federal, State, Local not specified - PPO | Admitting: Occupational Therapy

## 2015-08-14 ENCOUNTER — Ambulatory Visit: Payer: Federal, State, Local not specified - PPO | Admitting: Speech Pathology

## 2015-08-14 ENCOUNTER — Ambulatory Visit: Payer: Federal, State, Local not specified - PPO | Admitting: Physical Therapy

## 2015-08-14 ENCOUNTER — Encounter: Payer: Self-pay | Admitting: Occupational Therapy

## 2015-08-14 DIAGNOSIS — I69359 Hemiplegia and hemiparesis following cerebral infarction affecting unspecified side: Secondary | ICD-10-CM

## 2015-08-14 DIAGNOSIS — R4701 Aphasia: Secondary | ICD-10-CM

## 2015-08-14 DIAGNOSIS — IMO0002 Reserved for concepts with insufficient information to code with codable children: Secondary | ICD-10-CM

## 2015-08-14 DIAGNOSIS — R269 Unspecified abnormalities of gait and mobility: Secondary | ICD-10-CM | POA: Diagnosis not present

## 2015-08-14 DIAGNOSIS — R6889 Other general symptoms and signs: Secondary | ICD-10-CM

## 2015-08-14 DIAGNOSIS — G8191 Hemiplegia, unspecified affecting right dominant side: Secondary | ICD-10-CM

## 2015-08-14 NOTE — Therapy (Signed)
[facility] [address] [place], [place], [address] Phone: [phone]   Fax:  [phone]  Speech Language Pathology Treatment  Patient Details  Name: Richard Vaughn MRN: [id number] Date of Birth: August 30, 1951 Referring Sheehan Stacey:  Rosine Abe, DO  Encounter Date: 08/14/2015      End of Session - 08/14/15 1449    Visit Number 2   Number of Visits 17   Date for SLP Re-Evaluation 10/07/15   SLP Start Time 5   SLP Stop Time  1445   SLP Time Calculation (min) 43 min   Activity Tolerance Patient tolerated treatment well      Past Medical History  Diagnosis Date  . GERD (gastroesophageal reflux disease)   . Stricture and stenosis of esophagus   . Gastritis     Mild  . Barrett's esophagus   . Depression   . Gastroparesis   . Hyperglycemia   . Renal cyst   . Nephrolithiasis   . Chronic back pain     Past Surgical History  Procedure Laterality Date  . Lumbar disc surgery  2005, 2010    replaced L4 and L5  . Cervical disc surgery  2012    There were no vitals filed for this visit.  Visit Diagnosis: Dysarthria due to cerebrovascular accident  Aphasia      Subjective Assessment - 08/14/15 1403    Subjective "He gave me words to repeat 2x 3x a day"   Currently in Pain? Yes   Pain Score 6    Pain Location Back   Pain Orientation Lower   Pain Descriptors / Indicators Aching   Pain Type Chronic pain   Pain Onset More than a month ago   Pain Frequency Constant   Effect of Pain on Daily Activities ST will monitor, but not directly address in therapy"   Multiple Pain Sites No               ADULT SLP TREATMENT - 08/14/15 1405    General Information   Behavior/Cognition Alert;Cooperative;Pleasant mood   Cognitive-Linquistic Treatment   Treatment focused on Dysarthria   Skilled Treatment Added over exaggerated consonants for increased speech muscle work with minimal instruction and  modeling. Facilitated  exagerrated consonants with repetition of multisyllabic  words, then utilizing compensations of slow rate and overennunction to generate sentence  with word. Pt utlized strategies with rare min verbal cues and modeling. Pt performed HEP for dysarthria. with rare min A. Structured speech tasks with use of compensations with  occasional min cues, modeling . Simple conversation with slow, exagerated speech. Pt verbalized risk factors and s/s of CVA with dysarthira compensations.   Assessment / Recommendations / Plan   Plan Continue with current plan of care   Progression Toward Goals   Progression toward goals Progressing toward goals          SLP Education - 08/14/15 1444    Education provided Yes   Education Details HEP, CVA education, compensations for dysarthria   Person(s) Educated Patient   Methods Explanation;Demonstration;Handout;Verbal cues   Comprehension Verbalized understanding;Returned demonstration;Need further instruction          SLP Short Term Goals - 08/14/15 1448    SLP SHORT TERM GOAL #1   Title pt will complete HEP with occasional min A    Time 3   Period Weeks   Status On-going   SLP SHORT TERM GOAL #2   Title pt will demo compensations for dysarthria in  mod complex sentence responses 90%    Time 3   Period Weeks   Status On-going   SLP SHORT TERM GOAL #3   Title pt will produce 100% intelligible speech in 8 minutes simple conversation   Time 3   Period Weeks   Status On-going          SLP Long Term Goals - 08/14/15 1449    SLP LONG TERM GOAL #1   Title pt will complete HEP for dysarthria with rare min A   Time 7   Period Weeks   Status On-going   SLP LONG TERM GOAL #2   Title pt will produce 10 minutes intelligible mod complex conversation with rare min A   Time 7   Period Weeks   Status On-going          Plan - 08/14/15 1448    Treatment/Interventions SLP instruction and feedback;Compensatory  strategies;Patient/family education;Oral motor exercises;Functional tasks   Potential to Achieve Goals Good   Consulted and Agree with Plan of Care Patient        Problem List Patient Active Problem List   Diagnosis Date Noted  . Dysarthria   . CVA (cerebral infarction) 08/02/2015  . Acute hyperglycemia 08/02/2015  . Essential hypertension 12/27/2014  . Preventative health care 08/07/2013  . Epistaxis 07/28/2012  . HYPERGLYCEMIA 10/02/2010  . NEPHROLITHIASIS 05/23/2009  . BARRETTS ESOPHAGUS 02/20/2009  . Gastroparesis 02/20/2009  . RADICULOPATHY 10/21/2008  . GERD 10/08/2008  . ESOPHAGITIS 08/15/2008  . ESOPHAGEAL STRICTURE 08/15/2008  . GASTRITIS, ACUTE 08/15/2008  . RENAL CYST 08/14/2008  . TOBACCO ABUSE 08/08/2008  . HEMATURIA, MICROSCOPIC, HX OF 08/08/2008  . PULMONARY NODULE 01/10/2008    Lovvorn, Annye Rusk MS, CCC-SLP 08/14/2015, 2:50 PM  [facility] [address] [place], [place], [address] Phone: [phone]   Fax:  [phone]

## 2015-08-14 NOTE — Patient Instructions (Signed)
Homework provided 

## 2015-08-14 NOTE — Therapy (Signed)
[facility] [address] [place], [place], [address] Phone: [phone]   Fax:  [phone]  Occupational Therapy Treatment  Patient Details  Name: Richard Vaughn MRN: [id number] Date of Birth: October 20, 1951 Referring Provider:  Rosine Abe, DO  Encounter Date: 08/14/2015      OT End of Session - 08/14/15 0855    Visit Number 2   Number of Visits 17   Date for OT Re-Evaluation 10/06/15   Authorization Type [employer], 75 visits combined, no auth required   OT Start Time 0848   OT Stop Time 0930   OT Time Calculation (min) 42 min   Activity Tolerance Patient tolerated treatment well   Behavior During Therapy WFL for tasks assessed/performed      Past Medical History  Diagnosis Date  . GERD (gastroesophageal reflux disease)   . Stricture and stenosis of esophagus   . Gastritis     Mild  . Barrett's esophagus   . Depression   . Gastroparesis   . Hyperglycemia   . Renal cyst   . Nephrolithiasis   . Chronic back pain     Past Surgical History  Procedure Laterality Date  . Lumbar disc surgery  2005, 2010    replaced L4 and L5  . Cervical disc surgery  2012    There were no vitals filed for this visit.  Visit Diagnosis:  Lack of coordination due to stroke  Right hemiparesis  Decreased activity tolerance      Subjective Assessment - 08/14/15 0854    Subjective  Pt reports improvements in control for eating.   Patient is accompained by: Family member   Pertinent History CVA 08/02/15   Limitations mild colorblindness   Patient Stated Goals improve writing, hand use, eating   Currently in Pain? No/denies                      OT Treatments/Exercises (OP) - 08/14/15 0001    ADLs   Writing Practiced writing with approx 75-85% legibility.  Legibility decr the more pt writes.   Fine Motor Coordination   Fine Motor Coordination Small Pegboard   Small Pegboard Placing R hand in  pegboard to copy design with min difficulty with coordination.           OT Education - 08/14/15 0901    Education provided Yes   Education Details Coordination HEP, CVA risk factors and warning signs   Person(s) Educated Patient   Methods Explanation;Demonstration;Handout;Verbal cues   Comprehension Verbalized understanding;Returned demonstration;Verbal cues required          OT Short Term Goals - 08/07/15 1735    OT SHORT TERM GOAL #1   Title Pt will be independent with initial HEP.--check STGs 09/06/15   Time 4   Period Weeks   Status New   OT SHORT TERM GOAL #2   Title Pt will improve coordination to be able to eat without spills and cut food mod I.   Time 4   Period Weeks   Status New   OT SHORT TERM GOAL #3   Title Pt will report ability to perform grooming tasks with RUE without difficulty.   Time 4   Period Weeks   Status New   OT SHORT TERM GOAL #4   Title Pt will be able to write 2-3 sentences with 100% legibility.   Time 4   Period Weeks   Status New   OT SHORT TERM GOAL #  5   Title Pt will improve coordination for ADLs as shown by improving score on box and blocks test by at least 6 with RUE.   Baseline R-35, L-41 blocks   Time 4   Period Weeks   Status New           OT Long Term Goals - 08/07/15 2032    OT LONG TERM GOAL #1   Title Pt will be independent with updated HEP.--check LTGs 10/06/15   Time 8   Period Weeks   Status New   OT LONG TERM GOAL #2   Title Pt will be able to perform simple cooking/home maintenance tasks mod I.   Time 8   Period Weeks   Status New   OT LONG TERM GOAL #3   Title Pt will be able to perform financial management tasks without assist including writing.   Time 8   Period Weeks   Status New   OT LONG TERM GOAL #4   Title Pt will improve coordination as shown by improving time on 9-hole peg test by at least 8sec with RUE.   Baseline R-34.25sec, L-27.41sec   Time 8   Period Weeks   Status New   OT LONG  TERM GOAL #5   Title Pt will verbalize understanding of CVA risk factors and warning signs/symptoms.   Time 8   Period Weeks   Status New   Long Term Additional Goals   Additional Long Term Goals Yes   OT LONG TERM GOAL #6   Title Pt will be able to perform IADLs/simulated IADL tasks for at least 69min without rest break.               Plan - 08/14/15 0921    Clinical Impression Statement Pt demo improved coordination and is progressing towards goals.   Plan coordination, functional reaching   Consulted and Agree with Plan of Care Patient;Family member/caregiver   Family Member Consulted wife        Problem List Patient Active Problem List   Diagnosis Date Noted  . Dysarthria   . CVA (cerebral infarction) 08/02/2015  . Acute hyperglycemia 08/02/2015  . Essential hypertension 12/27/2014  . Preventative health care 08/07/2013  . Epistaxis 07/28/2012  . HYPERGLYCEMIA 10/02/2010  . NEPHROLITHIASIS 05/23/2009  . BARRETTS ESOPHAGUS 02/20/2009  . Gastroparesis 02/20/2009  . RADICULOPATHY 10/21/2008  . GERD 10/08/2008  . ESOPHAGITIS 08/15/2008  . ESOPHAGEAL STRICTURE 08/15/2008  . GASTRITIS, ACUTE 08/15/2008  . RENAL CYST 08/14/2008  . TOBACCO ABUSE 08/08/2008  . HEMATURIA, MICROSCOPIC, HX OF 08/08/2008  . PULMONARY NODULE 01/10/2008    [facility] 08/14/2015, 12:00 PM  [facility] [address] [place], [place], [address] Phone: [phone]   Fax:  [place], OTR/L 08/14/2015 12:00 PM

## 2015-08-14 NOTE — Patient Instructions (Addendum)
  Coordination Activities  Perform the following activities for 20 minutes 1-2 times per day with right hand(s).   Rotate ball in fingertips (clockwise and counter-clockwise).  Toss ball between hands.  Toss ball in air and catch with the same hand.  Flip cards 1 at a time as fast as you can.  Deal cards with your thumb (Hold deck in hand and push card off top with thumb).  Rotate card in hand (clockwise and counter-clockwise).  Flip card between each finger  Shuffle cards.  Pick up coins, buttons, marbles, dried beans/pasta, paperclips, beads, etc of different sizes and place in container.  Pick up coins and stack.  Pick up coins one at a time until you get 5-10 in your hand, then move coins from palm to fingertips to put in container/coin bank.  Practice writing and/or typing.  Screw together nuts and bolts, then unfasten.

## 2015-08-14 NOTE — Therapy (Signed)
[facility] [address] [place], [place], [address] Phone: [phone]   Fax:  [phone]  Physical Therapy Treatment  Patient Details  Name: Richard Vaughn MRN: [id number] Date of Birth: January 25, 1951 Referring Provider:  Rosine Abe, DO  Encounter Date: 08/14/2015      PT End of Session - 08/14/15 1147    Visit Number 2   Number of Visits 9   Date for PT Re-Evaluation 09/06/15   Authorization Type [employer] Federal   PT Start Time 0806   PT Stop Time 0847   PT Time Calculation (min) 41 min   Activity Tolerance Patient tolerated treatment well   Behavior During Therapy [facility] for tasks assessed/performed      Past Medical History  Diagnosis Date  . GERD (gastroesophageal reflux disease)   . Stricture and stenosis of esophagus   . Gastritis     Mild  . Barrett's esophagus   . Depression   . Gastroparesis   . Hyperglycemia   . Renal cyst   . Nephrolithiasis   . Chronic back pain     Past Surgical History  Procedure Laterality Date  . Lumbar disc surgery  2005, 2010    replaced L4 and L5  . Cervical disc surgery  2012    There were no vitals filed for this visit.  Visit Diagnosis:  Hemiparesis due to recent cerebral infarction  Abnormality of gait      Subjective Assessment - 08/14/15 0809    Subjective "I need to get my eyes checked. Ever since the stroke, it's been harder to see."   Pt also states,"I'd like to get back to normal. I was active before the stroke. I do a swim class 3 times a week. I'd really like to get back to that, It's my balance that's the most different."   Patient is accompained by: Family member  wife [name]   Currently in Pain? No/denies                Vestibular Assessment - 08/14/15 0001    Vestibular Assessment   General Observation decreased gait stability and pt-reported imbalance with horizontal head turn to R side during gait   Symptom Behavior   Type  of Dizziness Imbalance   Duration of Dizziness seconds   Aggravating Factors Turning head quickly   Relieving Factors Head stationary   Occulomotor Exam   Occulomotor Alignment Normal   Spontaneous Absent   Gaze-induced Absent   Smooth Pursuits Saccades  with horizontal tracking to L > R   Saccades Poor trajectory  undershooting to R; from inferior to midline   Vestibulo-Occular Reflex   VOR 1 Head Only (x 1 viewing) initially exhibited difficulty maintaining eyes on target with horizontal > vertical head movement; must move head slowly to maintain target in focus                 [facility] Adult PT Treatment/Exercise - 08/14/15 0001    Ambulation/Gait   Ambulation/Gait Yes   Ambulation/Gait Assistance 6: Modified independent (Device/Increase time)   Ambulation Distance (Feet) 350 Feet   Assistive device None   Gait Pattern Within Functional Limits   Ambulation Surface Level;Indoor   Stairs Yes   Stairs Assistance 5: Supervision   Stair Management Technique Two rails;Step to pattern;Forwards   Number of Stairs 4   Height of Stairs 6   Dynamic Gait Index   Level Surface Normal   Change in Gait Speed Normal  Gait with Horizontal Head Turns Mild Impairment  decreased stability and pt-reported imbalance w/ turn to R   Gait with Vertical Head Turns Normal   Gait and Pivot Turn Mild Impairment   Step Over Obstacle Mild Impairment   Step Around Obstacles Normal   Steps Moderate Impairment   Total Score 19         Vestibular Treatment/Exercise - 08/14/15 0001    Vestibular Treatment/Exercise   Vestibular Treatment Provided Gaze   Gaze Exercises X1 Viewing Horizontal;X1 Viewing Vertical   X1 Viewing Horizontal   Foot Position shoulder width   Comments 30 seconds   X1 Viewing Vertical   Foot Position shoulder width   Comments 30 seconds            Balance Exercises - 08/14/15 0838    Balance Exercises: Standing   Standing Eyes Opened Wide (BOA);Head  turns;Foam/compliant surface;Other reps (comment)  diagonal head turns up/right to down/left x10 each   Standing Eyes Closed Wide (BOA);Head turns;Foam/compliant surface;Other reps (comment)  vertical and horizontal head turns x10 each   SLS Eyes open;Solid surface;2 reps;10 secs           PT Education - 08/14/15 0850    Education provided Yes   Education Details DGI score and functional implications, fall risk. Additions to HEP; see Pt Instructions.   Person(s) Educated Patient;Spouse   Methods Explanation;Demonstration;Verbal cues;Handout   Comprehension Verbalized understanding;Returned demonstration             PT Long Term Goals - 08/10/15 2056    PT LONG TERM GOAL #1   Title Incr. Berg score to >/= 46/56 to decr. fall risk.  (09-06-15)   Baseline 41/56   Time 4   Period Weeks   Status New   PT LONG TERM GOAL #2   Title Improve TUG score to </= 10.0 secs for reduced fall risk.  (09-06-15)   Baseline 13.8 secs   Time 4   Period Weeks   Status New   PT LONG TERM GOAL #3   Title Incr. gait velocity to >/= 3.7 ft/sec without device for incr. gait efficiency.  (09-06-15)   Baseline 3.01 ft/sec   Time 4   Period Weeks   Status New   PT LONG TERM GOAL #4   Title Independent in HEP for high level balance exercises.  (09-06-15)   Time 4   Period Weeks   Status New               Plan - 08/14/15 1147    Clinical Impression Statement Skilled session focused on assessing/addressing dynamic gait stability. Pt scored 19/24 on DGI, suggesting fall risk. Pt noted perception of being more "off-balance" with head turn to R during DGI. Brief occulomotor assessment suggests decreased occulomotor coordination, saccadic horizontal smooth pursuits, impaired VOR. Added high level balance exercises and gaze stabilization to HEP. Continue per POC.    Pt will benefit from skilled therapeutic intervention in order to improve on the following deficits Abnormal gait;Decreased  endurance;Decreased activity tolerance;Decreased balance;Decreased mobility;Decreased strength;Decreased coordination   Rehab Potential Good   PT Frequency 2x / week   PT Duration 4 weeks   PT Treatment/Interventions ADLs/Self Care Home Management;Functional mobility training;Stair training;Gait training;Therapeutic activities;Therapeutic exercise;Balance training;Neuromuscular re-education;Patient/family education   PT Next Visit Plan Continue high level balance training, dynamic gait.   Consulted and Agree with Plan of Care Patient;Family member/caregiver   Family Member Consulted wife        Problem List Patient  Active Problem List   Diagnosis Date Noted  . Dysarthria   . CVA (cerebral infarction) 08/02/2015  . Acute hyperglycemia 08/02/2015  . Essential hypertension 12/27/2014  . Preventative health care 08/07/2013  . Epistaxis 07/28/2012  . HYPERGLYCEMIA 10/02/2010  . NEPHROLITHIASIS 05/23/2009  . BARRETTS ESOPHAGUS 02/20/2009  . Gastroparesis 02/20/2009  . RADICULOPATHY 10/21/2008  . GERD 10/08/2008  . ESOPHAGITIS 08/15/2008  . ESOPHAGEAL STRICTURE 08/15/2008  . GASTRITIS, ACUTE 08/15/2008  . RENAL CYST 08/14/2008  . TOBACCO ABUSE 08/08/2008  . HEMATURIA, MICROSCOPIC, HX OF 08/08/2008  . PULMONARY NODULE 01/10/2008    Billie Ruddy, PT, DPT [facility] [address] [place], [place], [address] Phone: [phone]   Fax:  [phone] 08/14/2015, 11:58 AM

## 2015-08-14 NOTE — Patient Instructions (Signed)
SINGLE LIMB STANCE   Stance: single leg on floor. Raise leg. Hold __10_ seconds. Repeat with other leg. _2 reps per set, _3__ sets per day, _5__ days per week Copyright  VHI. All rights reserved.  Heel Raise: Bilateral (Standing)   Rise on balls of feet. Repeat __10_ times per set. Do _3___ sets per session. Do __3__ sessions per day.   Gaze Stabilization: Standing Feet Apart   Feet shoulder width apart, keeping eyes on target on wall __6__ feet away, tilt head down 15-30 and move head side to side for _30___ seconds. Repeat while moving head up and down for __30__ seconds. Do  4 sessions per day with glasses on; 4 sessions with glasses off.  Gaze Stabilization: Tip Card 1.Target must remain in focus, not blurry, and appear stationary while head is in motion. 2.Perform exercises with small head movements (45 to either side of midline). 3.Increase speed of head motion so long as target is in focus. 4.If you wear eyeglasses, be sure you can see target through lens (therapist will give specific instructions for bifocal / progressive lenses). 5.These exercises may provoke dizziness or nausea. Work through these symptoms. If too dizzy, slow head movement slightly. Rest between each exercise. 6.Exercises demand concentration; avoid distractions. 7.For safety, perform standing exercises close to a counter, wall, corner, or next to someone.  Feet Apart (Compliant Surface) Head Motion    With eyes open, standing on pillow with back to corner and stable chair in front of you. Feet shoulder width apart.  With  eyes closed: move head slowly: up and down. Repeat __10__ times per session. Do _2_ sessions per day.  With eyes open: move head diagonally from right/up to left/down then from right/down to left/up. Repeat __10__ times per session. Do _2_ sessions per day.

## 2015-08-15 ENCOUNTER — Ambulatory Visit: Payer: Federal, State, Local not specified - PPO

## 2015-08-15 ENCOUNTER — Ambulatory Visit: Payer: Federal, State, Local not specified - PPO | Admitting: Physical Therapy

## 2015-08-15 DIAGNOSIS — R4701 Aphasia: Secondary | ICD-10-CM

## 2015-08-15 DIAGNOSIS — IMO0002 Reserved for concepts with insufficient information to code with codable children: Secondary | ICD-10-CM

## 2015-08-15 DIAGNOSIS — R269 Unspecified abnormalities of gait and mobility: Secondary | ICD-10-CM | POA: Diagnosis not present

## 2015-08-15 DIAGNOSIS — I69359 Hemiplegia and hemiparesis following cerebral infarction affecting unspecified side: Secondary | ICD-10-CM

## 2015-08-15 NOTE — Therapy (Signed)
[facility] [address] [place], [place], [address] Phone: [phone]   Fax:  [phone]  Speech Language Pathology Treatment  Patient Details  Name: Richard Vaughn MRN: [id number] Date of Birth: 13-Jun-1951 Referring Provider:  Rosine Abe, DO  Encounter Date: 08/15/2015      End of Session - 08/15/15 1621    Visit Number 3   Number of Visits 17   Date for SLP Re-Evaluation 10/07/15   Authorization - Number of Visits 51   SLP Start Time 7341   SLP Stop Time  9379   SLP Time Calculation (min) 37 min   Activity Tolerance Patient tolerated treatment well      Past Medical History  Diagnosis Date  . GERD (gastroesophageal reflux disease)   . Stricture and stenosis of esophagus   . Gastritis     Mild  . Barrett's esophagus   . Depression   . Gastroparesis   . Hyperglycemia   . Renal cyst   . Nephrolithiasis   . Chronic back pain     Past Surgical History  Procedure Laterality Date  . Lumbar disc surgery  2005, 2010    replaced L4 and L5  . Cervical disc surgery  2012    There were no vitals filed for this visit.  Visit Diagnosis: Dysarthria due to cerebrovascular accident  Aphasia      Subjective Assessment - 08/15/15 1544    Subjective Pt arrived approx 8 minutes late to session this afternoon.               ADULT SLP TREATMENT - 08/15/15 1545    General Information   Behavior/Cognition Alert;Cooperative;Pleasant mood   Pain Assessment   Pain Assessment 0-10   Pain Score 5    Pain Location back   Pain Descriptors / Indicators Constant   Pain Intervention(s) Monitored during session   Cognitive-Linquistic Treatment   Treatment focused on Dysarthria   Skilled Treatment SLP used sentence responses from pt to facilitate practice with increasing intelligibility of pt speech. (similarities/differences) After reminding pt of compensations of overarticulation and slowed rate, pt  used these compensations with occasional min A from SLP, faded to rare min A at VERY end of task. Simple conversation with compensations   Assessment / Recommendations / Plan   Plan Continue with current plan of care   Progression Toward Goals   Progression toward goals Progressing toward goals          SLP Education - 08/15/15 1620    Education provided Yes   Education Details dysarthria compensations   Person(s) Educated Patient   Methods Explanation;Demonstration;Verbal cues   Comprehension Verbalized understanding;Need further instruction;Returned demonstration;Verbal cues required          SLP Short Term Goals - 08/15/15 1623    SLP SHORT TERM GOAL #1   Title pt will complete HEP with occasional min A    Time 4   Period Weeks   Status On-going   SLP SHORT TERM GOAL #2   Title pt will demo compensations for dysarthria in mod complex sentence responses 90%    Time 4   Period Weeks   Status On-going   SLP SHORT TERM GOAL #3   Title pt will produce 100% intelligible speech in 8 minutes simple conversation   Time 4   Period Weeks   Status On-going          SLP Long Term Goals - 08/15/15 1623  SLP LONG TERM GOAL #1   Title pt will complete HEP for dysarthria with rare min A   Time 8   Period Weeks   Status On-going   SLP LONG TERM GOAL #2   Title pt will produce 10 minutes intelligible mod complex conversation with rare min A   Time 8   Period Weeks   Status On-going          Plan - 08/15/15 1622    Speech Therapy Frequency 2x / week   Duration --  8 weeks   Treatment/Interventions SLP instruction and feedback;Compensatory strategies;Patient/family education;Oral motor exercises;Functional tasks   Potential to Achieve Goals Good        Problem List Patient Active Problem List   Diagnosis Date Noted  . Dysarthria   . CVA (cerebral infarction) 08/02/2015  . Acute hyperglycemia 08/02/2015  . Essential hypertension 12/27/2014  . Preventative  health care 08/07/2013  . Epistaxis 07/28/2012  . HYPERGLYCEMIA 10/02/2010  . NEPHROLITHIASIS 05/23/2009  . BARRETTS ESOPHAGUS 02/20/2009  . Gastroparesis 02/20/2009  . RADICULOPATHY 10/21/2008  . GERD 10/08/2008  . ESOPHAGITIS 08/15/2008  . ESOPHAGEAL STRICTURE 08/15/2008  . GASTRITIS, ACUTE 08/15/2008  . RENAL CYST 08/14/2008  . TOBACCO ABUSE 08/08/2008  . HEMATURIA, MICROSCOPIC, HX OF 08/08/2008  . PULMONARY NODULE 01/10/2008    [facility] , [place], [place]  08/15/2015, 4:24 PM  [facility] [address] [place], [place], [address] Phone: [phone]   Fax:  [phone]

## 2015-08-15 NOTE — Therapy (Signed)
[facility] [address] [place], [place], [address] Phone: [phone]   Fax:  [phone]  Physical Therapy Treatment  Patient Details  Name: Richard Vaughn MRN: [id number] Date of Birth: [birth year] Referring Provider:  Rosine Abe, DO  Encounter Date: 08/15/2015      PT End of Session - 08/15/15 1813    Visit Number 3   Number of Visits 9   Date for PT Re-Evaluation 09/06/15   Authorization Type [employer] Federal   PT Start Time 1316   PT Stop Time 1358   PT Time Calculation (min) 42 min   Activity Tolerance Patient tolerated treatment well   Behavior During Therapy --  tearful      Past Medical History  Diagnosis Date  . GERD (gastroesophageal reflux disease)   . Stricture and stenosis of esophagus   . Gastritis     Mild  . Barrett's esophagus   . Depression   . Gastroparesis   . Hyperglycemia   . Renal cyst   . Nephrolithiasis   . Chronic back pain     Past Surgical History  Procedure Laterality Date  . Lumbar disc surgery  2005, 2010    replaced L4 and L5  . Cervical disc surgery  2012    There were no vitals filed for this visit.  Visit Diagnosis:  Abnormality of gait  Hemiparesis due to recent cerebral infarction      Subjective Assessment - 08/15/15 1411    Subjective Pt noted to be tearful upon arrival to session. Upon further questioning, pt uncontrollably sobbing, reporting that he had a panic attack last night. Pt reports wife (who is a Marine scientist) has checked vital signs, which are WNL. Pt reports feeling overwhelmed by new impairments. Also expresses being very fearful of having another stroke and losing more of independence. Following tearful episode, pt requesting to continue with PT session. When this PT asked for permission to notify primary MD, pt politely declined.   Currently in Pain? No/denies                         [facility] Adult PT Treatment/Exercise -  08/15/15 0001    Ambulation/Gait   Ambulation/Gait Yes   Ambulation/Gait Assistance 5: Supervision   Ambulation/Gait Assistance Details Due to pt tendency to nearly collide with objects on R side (door frame, chair) when turning head to speak with this PT.   Ambulation Distance (Feet) 700 Feet   Assistive device None   Gait Pattern Within Functional Limits   Ambulation Surface Level;Unlevel;Indoor;Outdoor;Paved   Stairs --   Stairs Assistance --   Stair Management Technique --   Number of Stairs --   Height of Stairs --   Gait Comments Provided cueing for gaze fixation to increase gait stability with functional head turns; also provided cueing to turn eyes, head, then body when turning.         Vestibular Treatment/Exercise - 08/15/15 0001    Vestibular Treatment/Exercise   Vestibular Treatment Provided Gaze   Gaze Exercises X1 Viewing Horizontal;X1 Viewing Vertical   X1 Viewing Horizontal   Foot Position shoulder width   Comments 30 seconds  effective between-session carryover   X1 Viewing Vertical   Foot Position shoulder width   Comments 30 seconds  effective between-session carryover            Balance Exercises - 08/15/15 1812    Balance Exercises: Standing   Standing Eyes  Opened Wide (BOA);Head turns;Foam/compliant surface;Other reps (comment)  diagonal head turns up/right to down/left x10 each   Standing Eyes Closed Wide (BOA);Head turns;Foam/compliant surface;Other reps (comment)  vertical and horizontal head turns x10 each           PT Education - 08/15/15 1755    Education provided Yes   Education Details Compensatory strategies to increase stability with turning head/body during gait; community resources (Stroke support group, counseling) for coping. CVA risk factors.   Person(s) Educated Patient   Methods Explanation;Demonstration;Verbal cues;Handout   Comprehension Verbalized understanding;Returned demonstration             PT Long Term  Goals - 08/15/15 1822    PT LONG TERM GOAL #1   Title Incr. Berg score to >/= 46/56 to decr. fall risk.  (09-06-15)   Baseline 41/56   Time 4   Period Weeks   Status On-going   PT LONG TERM GOAL #2   Title Improve TUG score to </= 10.0 secs for reduced fall risk.  (09-06-15)   Baseline 13.8 secs   Time 4   Period Weeks   Status On-going   PT LONG TERM GOAL #3   Title Incr. gait velocity to >/= 3.7 ft/sec without device for incr. gait efficiency.  (09-06-15)   Baseline 3.01 ft/sec   Time 4   Period Weeks   Status On-going   PT LONG TERM GOAL #4   Title Independent in HEP for high level balance exercises.  (09-06-15)   Baseline Met 9/30   Time 4   Period Weeks   Status Achieved               Plan - 08/15/15 1814    Clinical Impression Statement Balance/gait impairments more prominent today due to pt having gotten minimal sleep last night after having a panic attack. Pt declined this PT's request to notify primary MD of recent onset of anxiety, difficulty coping with impairments due to CVA. Provided education on community resources (support group) and counseling. Educated pt on use of compensatory strategies (gaze fixation) to increase stability with turning head/body during gait. Continue per POC.   Pt will benefit from skilled therapeutic intervention in order to improve on the following deficits Abnormal gait;Decreased endurance;Decreased activity tolerance;Decreased balance;Decreased mobility;Decreased strength;Decreased coordination   Rehab Potential Good   PT Frequency 2x / week   PT Duration 4 weeks   PT Treatment/Interventions ADLs/Self Care Home Management;Functional mobility training;Stair training;Gait training;Therapeutic activities;Therapeutic exercise;Balance training;Neuromuscular re-education;Patient/family education   PT Next Visit Plan Continue high level balance training, dynamic gait.   Consulted and Agree with Plan of Care Patient        Problem  List Patient Active Problem List   Diagnosis Date Noted  . Dysarthria   . CVA (cerebral infarction) 08/02/2015  . Acute hyperglycemia 08/02/2015  . Essential hypertension 12/27/2014  . Preventative health care 08/07/2013  . Epistaxis 07/28/2012  . HYPERGLYCEMIA 10/02/2010  . NEPHROLITHIASIS 05/23/2009  . BARRETTS ESOPHAGUS 02/20/2009  . Gastroparesis 02/20/2009  . RADICULOPATHY 10/21/2008  . GERD 10/08/2008  . ESOPHAGITIS 08/15/2008  . ESOPHAGEAL STRICTURE 08/15/2008  . GASTRITIS, ACUTE 08/15/2008  . RENAL CYST 08/14/2008  . TOBACCO ABUSE 08/08/2008  . HEMATURIA, MICROSCOPIC, HX OF 08/08/2008  . PULMONARY NODULE 01/10/2008    Billie Ruddy, PT, DPT [facility] [address] [place], [place], [address] Phone: [phone]   Fax:  [phone] 08/15/2015, 6:23 PM

## 2015-08-20 ENCOUNTER — Ambulatory Visit: Payer: Federal, State, Local not specified - PPO | Admitting: Occupational Therapy

## 2015-08-20 ENCOUNTER — Ambulatory Visit: Payer: Federal, State, Local not specified - PPO

## 2015-08-20 ENCOUNTER — Ambulatory Visit: Payer: Federal, State, Local not specified - PPO | Attending: Internal Medicine | Admitting: Physical Therapy

## 2015-08-20 DIAGNOSIS — IMO0002 Reserved for concepts with insufficient information to code with codable children: Secondary | ICD-10-CM

## 2015-08-20 DIAGNOSIS — Z7409 Other reduced mobility: Secondary | ICD-10-CM | POA: Diagnosis present

## 2015-08-20 DIAGNOSIS — G8191 Hemiplegia, unspecified affecting right dominant side: Secondary | ICD-10-CM | POA: Insufficient documentation

## 2015-08-20 DIAGNOSIS — R279 Unspecified lack of coordination: Secondary | ICD-10-CM | POA: Insufficient documentation

## 2015-08-20 DIAGNOSIS — R269 Unspecified abnormalities of gait and mobility: Secondary | ICD-10-CM | POA: Diagnosis present

## 2015-08-20 DIAGNOSIS — I69359 Hemiplegia and hemiparesis following cerebral infarction affecting unspecified side: Secondary | ICD-10-CM | POA: Diagnosis present

## 2015-08-20 DIAGNOSIS — I6980 Unspecified sequelae of other cerebrovascular disease: Secondary | ICD-10-CM | POA: Diagnosis present

## 2015-08-20 DIAGNOSIS — R4701 Aphasia: Secondary | ICD-10-CM | POA: Insufficient documentation

## 2015-08-20 DIAGNOSIS — I69922 Dysarthria following unspecified cerebrovascular disease: Secondary | ICD-10-CM | POA: Diagnosis present

## 2015-08-20 NOTE — Therapy (Signed)
[facility] [address] [place], [place], [address] Phone: [phone]   Fax:  [phone]  Occupational Therapy Treatment  Patient Details  Name: Richard Vaughn MRN: [id number] Date of Birth: [birth year] Referring Provider:  Rosine Abe, DO  Encounter Date: 08/20/2015      OT End of Session - 08/20/15 1241    Visit Number 3   Number of Visits 17   Date for OT Re-Evaluation 10/06/15   Authorization Type [employer]BS Federal, 75 visits combined, no auth required   OT Start Time 1148   OT Stop Time 1230   OT Time Calculation (min) 42 min   Activity Tolerance Patient tolerated treatment well      Past Medical History  Diagnosis Date  . GERD (gastroesophageal reflux disease)   . Stricture and stenosis of esophagus   . Gastritis     Mild  . Barrett's esophagus   . Depression   . Gastroparesis   . Hyperglycemia   . Renal cyst   . Nephrolithiasis   . Chronic back pain     Past Surgical History  Procedure Laterality Date  . Lumbar disc surgery  2005, 2010    replaced L4 and L5  . Cervical disc surgery  2012    There were no vitals filed for this visit.  Visit Diagnosis:  Lack of coordination due to stroke  Right hemiparesis [facility])      Subjective Assessment - 08/20/15 1150    Pertinent History CVA 08/02/15   Limitations mild colorblindness   Patient Stated Goals improve writing, hand use, eating   Currently in Pain? Yes  lower back pain (chronic) - O.T. not addressing   Pain Score 5    Pain Location Back   Pain Orientation Lower            [facility] OT Assessment - 08/20/15 0001    Vision Assessment   Comment Pt was assessed with double simultaneous visual stimulation for signs of inattention to Rt side (d/t P.T. reporting she noticed he had missed objects on Rt with multiple stimuli). However, pt appeared WNL's with testing. May have more difficulty in functional environmental context with more than  2 stimuli.                   OT Treatments/Exercises (OP) - 08/20/15 0001    ADLs   Writing Practiced writing name x 5 reps with regular pen, then again with built up pen for comparison. Pt with no significant difference in legibility (approx 80%) and told pt that built up pen may just help with fatigue and letter size. Pt wished to continue practicing with regular pen. Pt then completed "distal finger control" worksheet for control and accuracy with min difficulty and going outside of lines x 2.    Functional Reaching Activities   High Level High level reaching RUE to place small pegs in pegboard on vertical surface for coordination and strength/endurance RT hand/UE. Pt challenged to manipulate 3 pegs in hand for placement with min to mod drops and difficulty.                   OT Short Term Goals - 08/07/15 1735    OT SHORT TERM GOAL #1   Title Pt will be independent with initial HEP.--check STGs 09/06/15   Time 4   Period Weeks   Status New   OT SHORT TERM GOAL #2   Title Pt will improve coordination to  be able to eat without spills and cut food mod I.   Time 4   Period Weeks   Status New   OT SHORT TERM GOAL #3   Title Pt will report ability to perform grooming tasks with RUE without difficulty.   Time 4   Period Weeks   Status New   OT SHORT TERM GOAL #4   Title Pt will be able to write 2-3 sentences with 100% legibility.   Time 4   Period Weeks   Status New   OT SHORT TERM GOAL #5   Title Pt will improve coordination for ADLs as shown by improving score on box and blocks test by at least 6 with RUE.   Baseline R-35, L-41 blocks   Time 4   Period Weeks   Status New           OT Long Term Goals - 08/07/15 2032    OT LONG TERM GOAL #1   Title Pt will be independent with updated HEP.--check LTGs 10/06/15   Time 8   Period Weeks   Status New   OT LONG TERM GOAL #2   Title Pt will be able to perform simple cooking/home maintenance tasks mod I.    Time 8   Period Weeks   Status New   OT LONG TERM GOAL #3   Title Pt will be able to perform financial management tasks without assist including writing.   Time 8   Period Weeks   Status New   OT LONG TERM GOAL #4   Title Pt will improve coordination as shown by improving time on 9-hole peg test by at least 8sec with RUE.   Baseline R-34.25sec, L-27.41sec   Time 8   Period Weeks   Status New   OT LONG TERM GOAL #5   Title Pt will verbalize understanding of CVA risk factors and warning signs/symptoms.   Time 8   Period Weeks   Status New   Long Term Additional Goals   Additional Long Term Goals Yes   OT LONG TERM GOAL #6   Title Pt will be able to perform IADLs/simulated IADL tasks for at least 41min without rest break.               Plan - 08/20/15 1242    Clinical Impression Statement Pt approximating all STG's and anticipate faster progress than originally planned.    Plan continue coordination, writing, RUE strength/endurance   Consulted and Agree with Plan of Care Patient        Problem List Patient Active Problem List   Diagnosis Date Noted  . Dysarthria   . CVA (cerebral infarction) 08/02/2015  . Acute hyperglycemia 08/02/2015  . Essential hypertension 12/27/2014  . Preventative health care 08/07/2013  . Epistaxis 07/28/2012  . HYPERGLYCEMIA 10/02/2010  . NEPHROLITHIASIS 05/23/2009  . BARRETTS ESOPHAGUS 02/20/2009  . Gastroparesis 02/20/2009  . RADICULOPATHY 10/21/2008  . GERD 10/08/2008  . ESOPHAGITIS 08/15/2008  . ESOPHAGEAL STRICTURE 08/15/2008  . GASTRITIS, ACUTE 08/15/2008  . RENAL CYST 08/14/2008  . TOBACCO ABUSE 08/08/2008  . HEMATURIA, MICROSCOPIC, HX OF 08/08/2008  . PULMONARY NODULE 01/10/2008    Carey Bullocks, OTR/L 08/20/2015, 12:44 PM  [facility] [address] [place], [place], [address] Phone: [phone]   Fax:  [phone]

## 2015-08-20 NOTE — Patient Instructions (Signed)
SINGLE LIMB STANCE   Stance: single leg on floor. Raise leg. Hold __15_ seconds. Repeat with other leg. _2 reps per set, _3__ sets per day, _5__ days per week Copyright  VHI. All rights reserved.  Heel Raise: Bilateral (Standing)   Rise on balls of feet. Repeat __10_ times per set. Do _3___ sets per session. Do __3__ sessions per day.   Gaze Stabilization: Standing Feet Apart   Feet shoulder width apart, keeping eyes on target on wall __6__ feet away, tilt head down 15-30 and move head side to side for _45___ seconds. Repeat while moving head up and down for __45__ seconds. Do 2 essions per day with glasses on; 2 sessions with glasses off.  Gaze Stabilization: Tip Card 1.Target must remain in focus, not blurry, and appear stationary while head is in motion. 2.Perform exercises with small head movements (45 to either side of midline). 3.Increase speed of head motion so long as target is in focus. 4.If you wear eyeglasses, be sure you can see target through lens (therapist will give specific instructions for bifocal / progressive lenses). 5.These exercises may provoke dizziness or nausea. Work through these symptoms. If too dizzy, slow head movement slightly. Rest between each exercise. 6.Exercises demand concentration; avoid distractions. 7.For safety, perform standing exercises close to a counter, wall, corner, or next to someone.  Feet Apart (Compliant Surface) Head Motion    With eyesclosed, standing on pillow with back to corner and stable chair in front of you. Feet shoulder width apart.  With eyes closed: move head slowly: up and down; right to left. Then with eyes closed, move head diagonally from right/up to left/down then from right/down to left/up . Do _2_ sessions per day.

## 2015-08-20 NOTE — Therapy (Signed)
[facility] [address] [place], [place], [address] Phone: [phone]   Fax:  [phone]  Speech Language Pathology Treatment  Patient Details  Name: Richard Vaughn MRN: [id number] Date of Birth: 09/02/1951 Referring Provider:  Rosine Abe, DO  Encounter Date: 08/20/2015      End of Session - 08/20/15 1101    Visit Number 4   Number of Visits 17   Date for SLP Re-Evaluation 10/07/15   SLP Start Time 81   SLP Stop Time  54   SLP Time Calculation (min) 40 min   Activity Tolerance Patient tolerated treatment well      Past Medical History  Diagnosis Date  . GERD (gastroesophageal reflux disease)   . Stricture and stenosis of esophagus   . Gastritis     Mild  . Barrett's esophagus   . Depression   . Gastroparesis   . Hyperglycemia   . Renal cyst   . Nephrolithiasis   . Chronic back pain     Past Surgical History  Procedure Laterality Date  . Lumbar disc surgery  2005, 2010    replaced L4 and L5  . Cervical disc surgery  2012    There were no vitals filed for this visit.  Visit Diagnosis: Dysarthria due to cerebrovascular accident  Aphasia      Subjective Assessment - 08/20/15 1028    Subjective Pt feels his speech is improving little by little with the HEP provided last session.               ADULT SLP TREATMENT - 08/20/15 1028    General Information   Behavior/Cognition Alert;Cooperative;Pleasant mood   Pain Assessment   Pain Assessment 0-10   Pain Score 5    Pain Location back   Pain Descriptors / Indicators Constant   Pain Intervention(s) Monitored during session   Cognitive-Linquistic Treatment   Treatment focused on Dysarthria   Skilled Treatment In conversation to assess whether of not pt speech severe enough for SLP to provide specific consonant HEP. Pt cont to have difficulty so SLP generated further HEP to address specific strength for specific consonants and  vowels.    Assessment / Recommendations / Plan   Plan Continue with current plan of care          SLP Education - 08/20/15 1101    Education provided Yes   Education Details HEP (specific phonemes), compensations of reduced rate and overarticulation   Person(s) Educated Patient   Methods Explanation   Comprehension Verbalized understanding          SLP Short Term Goals - 08/20/15 1105    SLP SHORT TERM GOAL #1   Title pt will complete HEP with occasional min A    Time 3   Period Weeks   Status On-going   SLP SHORT TERM GOAL #2   Title pt will demo compensations for dysarthria in mod complex sentence responses 90%    Time 3   Period Weeks   Status On-going   SLP SHORT TERM GOAL #3   Title pt will produce 100% intelligible speech in 8 minutes simple conversation   Time 3   Period Weeks   Status On-going          SLP Long Term Goals - 08/20/15 1105    SLP LONG TERM GOAL #1   Title pt will complete HEP for dysarthria with rare min A   Time 7   Period Weeks  Status On-going   SLP LONG TERM GOAL #2   Title pt will produce 10 minutes intelligible mod complex conversation with rare min A   Time 7   Period Weeks   Status On-going          Plan - 08/20/15 1103    Clinical Impression Statement Pt utilized compensations for dysarthria with occasional  min cues in 3 minutes simple conversation. He performed HEP with significantly exagerrated consonants with rare min A. No overt signs of aphasia today. Skilled ST needed to cont to maximize speech output clearly and address lingering aphasia.   Speech Therapy Frequency 2x / week   Duration --  7 weeks   Treatment/Interventions SLP instruction and feedback;Compensatory strategies;Patient/family education;Oral motor exercises;Functional tasks   Potential to Achieve Goals Good   SLP Home Exercise Plan provided today   Consulted and Agree with Plan of Care Patient        Problem List Patient Active Problem List    Diagnosis Date Noted  . Dysarthria   . CVA (cerebral infarction) 08/02/2015  . Acute hyperglycemia 08/02/2015  . Essential hypertension 12/27/2014  . Preventative health care 08/07/2013  . Epistaxis 07/28/2012  . HYPERGLYCEMIA 10/02/2010  . NEPHROLITHIASIS 05/23/2009  . BARRETTS ESOPHAGUS 02/20/2009  . Gastroparesis 02/20/2009  . RADICULOPATHY 10/21/2008  . GERD 10/08/2008  . ESOPHAGITIS 08/15/2008  . ESOPHAGEAL STRICTURE 08/15/2008  . GASTRITIS, ACUTE 08/15/2008  . RENAL CYST 08/14/2008  . TOBACCO ABUSE 08/08/2008  . HEMATURIA, MICROSCOPIC, HX OF 08/08/2008  . PULMONARY NODULE 01/10/2008    [facility] , MS, CCC-SLP  08/20/2015, 11:06 AM  [facility] [address] [place], [place], [address] Phone: [phone]   Fax:  [phone]

## 2015-08-20 NOTE — Therapy (Signed)
[facility] [address] [place], [place], [address] Phone: [phone]   Fax:  [phone]  Physical Therapy Treatment  Patient Details  Name: Richard Vaughn MRN: [id number] Date of Birth: November 15, 1951 Referring Provider:  Rosine Abe, DO  Encounter Date: 08/20/2015      PT End of Session - 08/20/15 1247    Visit Number 4   Number of Visits 9   Date for PT Re-Evaluation 09/06/15   Authorization Type BCBS Federal   PT Start Time 1105   PT Stop Time 1145   PT Time Calculation (min) 40 min   Activity Tolerance Patient tolerated treatment well   Behavior During Therapy WFL for tasks assessed/performed      Past Medical History  Diagnosis Date  . GERD (gastroesophageal reflux disease)   . Stricture and stenosis of esophagus   . Gastritis     Mild  . Barrett's esophagus   . Depression   . Gastroparesis   . Hyperglycemia   . Renal cyst   . Nephrolithiasis   . Chronic back pain     Past Surgical History  Procedure Laterality Date  . Lumbar disc surgery  2005, 2010    replaced L4 and L5  . Cervical disc surgery  2012    There were no vitals filed for this visit.  Visit Diagnosis:  Abnormality of gait  Hemiparesis due to recent cerebral infarction [facility])      Subjective Assessment - 08/20/15 1111    Subjective Pt reports feeling "much better" than last session. Pt also states, "I've been running into the wall a little less with my right side." Denies falls.   Currently in Pain? Yes   Pain Score 5    Pain Location Back   Pain Orientation Lower   Pain Descriptors / Indicators Aching   Pain Type Chronic pain   Pain Onset More than a month ago   Pain Frequency Constant   Aggravating Factors  activity   Pain Relieving Factors sitting still   Multiple Pain Sites No                Vestibular Assessment - 08/20/15 0001    Occulomotor Exam   Comment Noted decreased attention to R visual  field when visual stimuli in L visual field.                 [facility] Adult PT Treatment/Exercise - 08/20/15 0001    Ambulation/Gait   Ambulation/Gait Yes   Ambulation/Gait Assistance 6: Modified independent (Device/Increase time)   Ambulation Distance (Feet) 350 Feet   Assistive device None   Gait Pattern Within Functional Limits   Ambulation Surface Level;Indoor   Neuro Re-ed    Neuro Re-ed Details  Progressed all standing balance home exercises with effective return demonstration from pt. See Pt Instructions for details on all exercises, reps, sets. With verbal instruction, cueing from PT, pt performed 90-degree turns with use of compensatory strategy (turning eyes, then head, then body) to increase stability with functional turning. Performed x10 reps in B directions.             Balance Exercises - 08/20/15 1242    Balance Exercises: Standing   Balance Master: Dynamic With moving force plate (40%) and moving surround (100%) and pt maintaining head forward, pt touched targets in R visual field (multiple directions). Performed 3 x 2-minute bouts with noted within-session improvement. Focused in increasing attention to R visual field with external visual  distractions to improve obstacle negotiation during ambulation.           PT Education - 08/20/15 1235    Education provided Yes   Education Details HEP progressed; see Pt Instructions.   Person(s) Educated Patient   Methods Explanation;Demonstration;Verbal cues;Handout   Comprehension Verbalized understanding;Returned demonstration             PT Long Term Goals - 08/15/15 1822    PT LONG TERM GOAL #1   Title Incr. Berg score to >/= 46/56 to decr. fall risk.  (09-06-15)   Baseline 41/56   Time 4   Period Weeks   Status On-going   PT LONG TERM GOAL #2   Title Improve TUG score to </= 10.0 secs for reduced fall risk.  (09-06-15)   Baseline 13.8 secs   Time 4   Period Weeks   Status On-going   PT LONG  TERM GOAL #3   Title Incr. gait velocity to >/= 3.7 ft/sec without device for incr. gait efficiency.  (09-06-15)   Baseline 3.01 ft/sec   Time 4   Period Weeks   Status On-going   PT LONG TERM GOAL #4   Title Independent in HEP for high level balance exercises.  (09-06-15)   Baseline Met 9/30   Time 4   Period Weeks   Status Achieved               Plan - 08/20/15 1248    Clinical Impression Statement At last session, pt required cueing to prevent collision with wall on R side (in narrow hallway and crowded environment). When formally tested during this session, noted decreased attention to R visual field with concurrent visual stimuli in L visual field. Current session focused on increasing pt attention to R visual field in the presence of external distractions, visual stimuli in L field. HEP progressed. Continue per POC.   Pt will benefit from skilled therapeutic intervention in order to improve on the following deficits Abnormal gait;Decreased endurance;Decreased activity tolerance;Decreased balance;Decreased mobility;Decreased strength;Decreased coordination   Rehab Potential Good   PT Frequency 2x / week   PT Duration 4 weeks   PT Treatment/Interventions ADLs/Self Care Home Management;Functional mobility training;Stair training;Gait training;Therapeutic activities;Therapeutic exercise;Balance training;Neuromuscular re-education;Patient/family education   PT Next Visit Plan Continue high level balance training, dynamic gait.    Consulted and Agree with Plan of Care Patient        Problem List Patient Active Problem List   Diagnosis Date Noted  . Dysarthria   . CVA (cerebral infarction) 08/02/2015  . Acute hyperglycemia 08/02/2015  . Essential hypertension 12/27/2014  . Preventative health care 08/07/2013  . Epistaxis 07/28/2012  . HYPERGLYCEMIA 10/02/2010  . NEPHROLITHIASIS 05/23/2009  . BARRETTS ESOPHAGUS 02/20/2009  . Gastroparesis 02/20/2009  . RADICULOPATHY  10/21/2008  . GERD 10/08/2008  . ESOPHAGITIS 08/15/2008  . ESOPHAGEAL STRICTURE 08/15/2008  . GASTRITIS, ACUTE 08/15/2008  . RENAL CYST 08/14/2008  . TOBACCO ABUSE 08/08/2008  . HEMATURIA, MICROSCOPIC, HX OF 08/08/2008  . PULMONARY NODULE 01/10/2008    Billie Ruddy, PT, DPT [facility] [address] [place], [place], [address] Phone: [phone]   Fax:  [phone] 08/20/2015, 12:53 PM

## 2015-08-20 NOTE — Patient Instructions (Signed)
MAKE ALL CONSONANTS AS STRONGLY and FORCEFULLY AS YOU CAN  Consonant  Vowel P    Ah  Pop-pop     Ay  pape pape     ee  Peep peep     Eye  Pipe pipe     oo  Poop poop     Oh  Pope pope  K      Cock cock, etc G      Gog gog, etc Ch      Wellsite geologist, etc j      jodge jodge, etc f      Fahf, fahf, etc. Sh      Shosh, shosh, etc

## 2015-08-21 ENCOUNTER — Ambulatory Visit: Payer: Federal, State, Local not specified - PPO | Admitting: Rehabilitation

## 2015-08-21 ENCOUNTER — Ambulatory Visit: Payer: Federal, State, Local not specified - PPO | Admitting: Occupational Therapy

## 2015-08-21 ENCOUNTER — Telehealth: Payer: Self-pay | Admitting: Internal Medicine

## 2015-08-21 ENCOUNTER — Ambulatory Visit: Payer: Federal, State, Local not specified - PPO

## 2015-08-21 ENCOUNTER — Encounter: Payer: Self-pay | Admitting: Rehabilitation

## 2015-08-21 DIAGNOSIS — IMO0002 Reserved for concepts with insufficient information to code with codable children: Secondary | ICD-10-CM

## 2015-08-21 DIAGNOSIS — R269 Unspecified abnormalities of gait and mobility: Secondary | ICD-10-CM | POA: Diagnosis not present

## 2015-08-21 DIAGNOSIS — R4701 Aphasia: Secondary | ICD-10-CM

## 2015-08-21 DIAGNOSIS — G8191 Hemiplegia, unspecified affecting right dominant side: Secondary | ICD-10-CM

## 2015-08-21 DIAGNOSIS — I69359 Hemiplegia and hemiparesis following cerebral infarction affecting unspecified side: Secondary | ICD-10-CM

## 2015-08-21 NOTE — Telephone Encounter (Signed)
Left message on voice mail to call back and make appointment

## 2015-08-21 NOTE — Patient Instructions (Signed)
  Please complete the assigned speech therapy homework and return it to your next session.  

## 2015-08-21 NOTE — Telephone Encounter (Addendum)
Pt saw dr Elease Hashimoto 9/27 for hospital follow up appointment.  However , pt states he is having trouble sleeping. Pt states due to recent stroke everything that goes along with that.  [facility] /[id number]

## 2015-08-21 NOTE — Therapy (Signed)
[facility] [address] [place], [place], [address] Phone: [phone]   Fax:  [phone]  Speech Language Pathology Treatment  Patient Details  Name: Richard Vaughn MRN: [id number] Date of Birth: 09-22-1951 Referring Provider:  Rosine Abe, DO  Encounter Date: 08/21/2015      End of Session - 08/21/15 1627    Visit Number 5   Number of Visits 17   Date for SLP Re-Evaluation 10/07/15   SLP Start Time 1533   SLP Stop Time  1613   SLP Time Calculation (min) 40 min   Activity Tolerance Patient tolerated treatment well      Past Medical History  Diagnosis Date  . GERD (gastroesophageal reflux disease)   . Stricture and stenosis of esophagus   . Gastritis     Mild  . Barrett's esophagus   . Depression   . Gastroparesis   . Hyperglycemia   . Renal cyst   . Nephrolithiasis   . Chronic back pain     Past Surgical History  Procedure Laterality Date  . Lumbar disc surgery  2005, 2010    replaced L4 and L5  . Cervical disc surgery  2012    There were no vitals filed for this visit.  Visit Diagnosis: Dysarthria due to cerebrovascular accident  Aphasia      Subjective Assessment - 08/20/15 1028    Subjective Pt feels his speech is improving little by little with the HEP provided last session.               ADULT SLP TREATMENT - 08/21/15 1534    General Information   Behavior/Cognition Alert;Cooperative;Pleasant mood   Pain Assessment   Pain Assessment 0-10   Pain Score 6    Pain Location lower back   Pain Descriptors / Indicators Constant   Pain Intervention(s) Monitored during session   Cognitive-Linquistic Treatment   Treatment focused on Dysarthria   Skilled Treatment SLP reminded pt of compensations of overarticulation and reduced rate. In sentence and multisentence responses, pt req'd occasional min A for overarticuation and reduced rate. Outside therapy room, pt maintained  compensations with good (not excellent) success and was approx 75-80% successful. Intelligibility was 90-95%   Assessment / Recommendations / Plan   Plan Continue with current plan of care   Progression Toward Goals   Progression toward goals Progressing toward goals          SLP Education - 08/20/15 1101    Education provided Yes   Education Details HEP (specific phonemes), compensations of reduced rate and overarticulation   Person(s) Educated Patient   Methods Explanation   Comprehension Verbalized understanding          SLP Short Term Goals - 08/20/15 1105    SLP SHORT TERM GOAL #1   Title pt will complete HEP with occasional min A    Time 3   Period Weeks   Status On-going   SLP SHORT TERM GOAL #2   Title pt will demo compensations for dysarthria in mod complex sentence responses 90%    Time 3   Period Weeks   Status On-going   SLP SHORT TERM GOAL #3   Title pt will produce 100% intelligible speech in 8 minutes simple conversation   Time 3   Period Weeks   Status On-going          SLP Long Term Goals - 08/20/15 1105    SLP LONG TERM GOAL #1  Title pt will complete HEP for dysarthria with rare min A   Time 7   Period Weeks   Status On-going   SLP LONG TERM GOAL #2   Title pt will produce 10 minutes intelligible mod complex conversation with rare min A   Time 7   Period Weeks   Status On-going          Plan - 08/21/15 1627    Clinical Impression Statement Pt utilized compensations for dysarthria with  simple conversation outside therapy room with approx 75% success. Again, no overt signs of aphasia today. Skilled ST needed to cont to maximize speech output clearly and address lingering aphasia.   Speech Therapy Frequency 2x / week   Duration --  7 weeks   Treatment/Interventions SLP instruction and feedback;Compensatory strategies;Patient/family education;Oral motor exercises;Functional tasks   Potential to Achieve Goals Good        Problem  List Patient Active Problem List   Diagnosis Date Noted  . Dysarthria   . CVA (cerebral infarction) 08/02/2015  . Acute hyperglycemia 08/02/2015  . Essential hypertension 12/27/2014  . Preventative health care 08/07/2013  . Epistaxis 07/28/2012  . HYPERGLYCEMIA 10/02/2010  . NEPHROLITHIASIS 05/23/2009  . BARRETTS ESOPHAGUS 02/20/2009  . Gastroparesis 02/20/2009  . RADICULOPATHY 10/21/2008  . GERD 10/08/2008  . ESOPHAGITIS 08/15/2008  . ESOPHAGEAL STRICTURE 08/15/2008  . GASTRITIS, ACUTE 08/15/2008  . RENAL CYST 08/14/2008  . TOBACCO ABUSE 08/08/2008  . HEMATURIA, MICROSCOPIC, HX OF 08/08/2008  . PULMONARY NODULE 01/10/2008    [facility] , [place], [place]  08/21/2015, 4:29 PM  [facility] [address] [place], [place], [address] Phone: [phone]   Fax:  [phone]

## 2015-08-21 NOTE — Telephone Encounter (Signed)
Recommend follow up to discuss options and sleep hygiene.

## 2015-08-21 NOTE — Telephone Encounter (Signed)
Please call pt and schedule follow up appointment with Dr. Elease Hashimoto per him to discuss sleep problem.

## 2015-08-21 NOTE — Therapy (Signed)
[facility] [address] [place], [place], [address] Phone: [phone]   Fax:  [phone]  Physical Therapy Treatment  Patient Details  Name: Richard Vaughn MRN: [id number] Date of Birth: 02-May-1951 Referring Provider:  Rosine Abe, DO  Encounter Date: 08/21/2015      PT End of Session - 08/21/15 1619    Visit Number 5   Number of Visits 9   Date for PT Re-Evaluation 09/06/15   Authorization Type BCBS Federal   PT Start Time 1402   PT Stop Time 1445   PT Time Calculation (min) 43 min   Activity Tolerance Patient tolerated treatment well   Behavior During Therapy WFL for tasks assessed/performed      Past Medical History  Diagnosis Date  . GERD (gastroesophageal reflux disease)   . Stricture and stenosis of esophagus   . Gastritis     Mild  . Barrett's esophagus   . Depression   . Gastroparesis   . Hyperglycemia   . Renal cyst   . Nephrolithiasis   . Chronic back pain     Past Surgical History  Procedure Laterality Date  . Lumbar disc surgery  2005, 2010    replaced L4 and L5  . Cervical disc surgery  2012    There were no vitals filed for this visit.  Visit Diagnosis:  Abnormality of gait  Right hemiparesis ([place])      Subjective Assessment - 08/21/15 1409    Subjective Pt reports no changes since last visit and is feeling better.     Currently in Pain? Yes   Pain Score 4    Pain Location Back   Pain Orientation Lower   Pain Descriptors / Indicators Aching   Pain Type Chronic pain   Pain Onset More than a month ago   Pain Frequency Constant                         [facility] Adult PT Treatment/Exercise - 08/21/15 1411    Ambulation/Gait   Ambulation/Gait Yes   Ambulation/Gait Assistance 6: Modified independent (Device/Increase time)   Ambulation Distance (Feet) 400 Feet  during mobility tasks   Assistive device None   Gait Pattern Within Functional Limits   Ambulation Surface Level;Indoor   High Level Balance   High Level Balance Comments Had pt perform several high balance activities to address standing balance and R inattention, esp with busy L visual field.  With targets placed to the R on busy background had pt stand on large gray foam with feet together>R SLS while finding targets called out by therapist.  Require intermittent min/guard when in SLS, otherwise did well finding targets, esp with continued practice.  Progressed to standing in PT lounge with TV on to pts L completing foam balance beam standing>squats on balance beam x 10 reps.  Then performed stepping over and back with RLE planted on BOSU ball (same set up with TV).  Then placed mirror to pts R with targets to scan for (same TV set up) progressing to hangman task on mirror.  Pt did very well with all tasks and seems to demonstrate improvement in attention and ability to scan R environment.  min/guard to min A (on BOSU ball) during tasks but no overt LOB.  Cues for increased ankle eversion while on compliant surface.     Neuro Re-ed    Neuro Re-ed Details  --  Balance Exercises - 08/20/15 1242    Balance Exercises: Standing   Balance Master: Dynamic With moving force plate (38%) and moving surround (100%) and pt maintaining head forward, pt touched targets in R visual field (multiple directions). Performed 3 x 2-minute bouts with noted within-session improvement. Focused in increasing attention to R visual field with external visual distractions to improve obstacle negotiation during ambulation.           PT Education - 08/21/15 1618    Education provided Yes   Education Details compliance with HEP   Person(s) Educated Patient   Methods Explanation   Comprehension Verbalized understanding             PT Long Term Goals - 08/15/15 1822    PT LONG TERM GOAL #1   Title Incr. Berg score to >/= 46/56 to decr. fall risk.  (09-06-15)   Baseline 41/56   Time  4   Period Weeks   Status On-going   PT LONG TERM GOAL #2   Title Improve TUG score to </= 10.0 secs for reduced fall risk.  (09-06-15)   Baseline 13.8 secs   Time 4   Period Weeks   Status On-going   PT LONG TERM GOAL #3   Title Incr. gait velocity to >/= 3.7 ft/sec without device for incr. gait efficiency.  (09-06-15)   Baseline 3.01 ft/sec   Time 4   Period Weeks   Status On-going   PT LONG TERM GOAL #4   Title Independent in HEP for high level balance exercises.  (09-06-15)   Baseline Met 9/30   Time 4   Period Weeks   Status Achieved               Plan - 08/21/15 1619    Clinical Impression Statement Continue to address R inattention with high level balance tasks, focusing on busy L visual field.  Performed all with min/guard to min A (only on BOSU ball task) but demonstrated improvement in scanning to the R.     Pt will benefit from skilled therapeutic intervention in order to improve on the following deficits Abnormal gait;Decreased endurance;Decreased activity tolerance;Decreased balance;Decreased mobility;Decreased strength;Decreased coordination   Rehab Potential Good   PT Frequency 2x / week   PT Duration 4 weeks   PT Treatment/Interventions ADLs/Self Care Home Management;Functional mobility training;Stair training;Gait training;Therapeutic activities;Therapeutic exercise;Balance training;Neuromuscular re-education;Patient/family education   PT Next Visit Plan Continue high level balance training, dynamic gait, gait outdoors   Consulted and Agree with Plan of Care Patient        Problem List Patient Active Problem List   Diagnosis Date Noted  . Dysarthria   . CVA (cerebral infarction) 08/02/2015  . Acute hyperglycemia 08/02/2015  . Essential hypertension 12/27/2014  . Preventative health care 08/07/2013  . Epistaxis 07/28/2012  . HYPERGLYCEMIA 10/02/2010  . NEPHROLITHIASIS 05/23/2009  . BARRETTS ESOPHAGUS 02/20/2009  . Gastroparesis 02/20/2009  .  RADICULOPATHY 10/21/2008  . GERD 10/08/2008  . ESOPHAGITIS 08/15/2008  . ESOPHAGEAL STRICTURE 08/15/2008  . GASTRITIS, ACUTE 08/15/2008  . RENAL CYST 08/14/2008  . TOBACCO ABUSE 08/08/2008  . HEMATURIA, MICROSCOPIC, HX OF 08/08/2008  . PULMONARY NODULE 01/10/2008    Cameron Sprang, PT, MPT [facility] [address] [place], [place], [address] Phone: [phone]   Fax:  [phone] 08/21/2015, 4:21 PM

## 2015-08-21 NOTE — Therapy (Signed)
[facility] [address] [place], [place], [address] Phone: [phone]   Fax:  [phone]  Occupational Therapy Treatment  Patient Details  Name: Richard Vaughn MRN: [id number] Date of Birth: [birth year] Referring Provider:  Rosine Abe, DO  Encounter Date: 08/21/2015      OT End of Session - 08/21/15 1707    Visit Number 4   Number of Visits 17   Date for OT Re-Evaluation 10/06/15   Authorization Type BCBS Federal, 75 visits combined, no auth required   OT Start Time 1450   OT Stop Time 1530   OT Time Calculation (min) 40 min   Activity Tolerance Patient tolerated treatment well      Past Medical History  Diagnosis Date  . GERD (gastroesophageal reflux disease)   . Stricture and stenosis of esophagus   . Gastritis     Mild  . Barrett's esophagus   . Depression   . Gastroparesis   . Hyperglycemia   . Renal cyst   . Nephrolithiasis   . Chronic back pain     Past Surgical History  Procedure Laterality Date  . Lumbar disc surgery  2005, 2010    replaced L4 and L5  . Cervical disc surgery  2012    There were no vitals filed for this visit.  Visit Diagnosis:  Lack of coordination due to stroke  Hemiparesis due to recent cerebral infarction [facility])      Subjective Assessment - 08/21/15 1449    Pertinent History CVA 08/02/15   Limitations mild colorblindness   Patient Stated Goals improve writing, hand use, eating   Currently in Pain? --  chronic back pain - O.T. not addressing                      OT Treatments/Exercises (OP) - 08/21/15 0001    ADLs   Writing Pt writing 2 checks and a long paragraph at approx. 90% legibility. Pt made 2 small errors in transposing #'s   Fine Motor Coordination   Fine Motor Coordination In hand manipuation training   In Hand Manipulation Training Rotating ball in fingertips clockwise/counter clockwise. Rotating 1 card around in hand clockwise  and counter clockwise with min to mod difficulty. Translating stress balls with min difficulty   Small Pegboard Placing grooved pegs in pegboard using tweezers with mod difficulty and min drops.                     OT Short Term Goals - 08/07/15 1735    OT SHORT TERM GOAL #1   Title Pt will be independent with initial HEP.--check STGs 09/06/15   Time 4   Period Weeks   Status New   OT SHORT TERM GOAL #2   Title Pt will improve coordination to be able to eat without spills and cut food mod I.   Time 4   Period Weeks   Status New   OT SHORT TERM GOAL #3   Title Pt will report ability to perform grooming tasks with RUE without difficulty.   Time 4   Period Weeks   Status New   OT SHORT TERM GOAL #4   Title Pt will be able to write 2-3 sentences with 100% legibility.   Time 4   Period Weeks   Status New   OT SHORT TERM GOAL #5   Title Pt will improve coordination for ADLs as shown by improving score on box  and blocks test by at least 6 with RUE.   Baseline R-35, L-41 blocks   Time 4   Period Weeks   Status New           OT Long Term Goals - 08/07/15 2032    OT LONG TERM GOAL #1   Title Pt will be independent with updated HEP.--check LTGs 10/06/15   Time 8   Period Weeks   Status New   OT LONG TERM GOAL #2   Title Pt will be able to perform simple cooking/home maintenance tasks mod I.   Time 8   Period Weeks   Status New   OT LONG TERM GOAL #3   Title Pt will be able to perform financial management tasks without assist including writing.   Time 8   Period Weeks   Status New   OT LONG TERM GOAL #4   Title Pt will improve coordination as shown by improving time on 9-hole peg test by at least 8sec with RUE.   Baseline R-34.25sec, L-27.41sec   Time 8   Period Weeks   Status New   OT LONG TERM GOAL #5   Title Pt will verbalize understanding of CVA risk factors and warning signs/symptoms.   Time 8   Period Weeks   Status New   Long Term Additional Goals    Additional Long Term Goals Yes   OT LONG TERM GOAL #6   Title Pt will be able to perform IADLs/simulated IADL tasks for at least 89min without rest break.               Plan - 08/21/15 1707    Clinical Impression Statement Pt progressing with higher level coordination.    Plan continue coordination, strength/endurance, assess STG's   Consulted and Agree with Plan of Care Patient        Problem List Patient Active Problem List   Diagnosis Date Noted  . Dysarthria   . CVA (cerebral infarction) 08/02/2015  . Acute hyperglycemia 08/02/2015  . Essential hypertension 12/27/2014  . Preventative health care 08/07/2013  . Epistaxis 07/28/2012  . HYPERGLYCEMIA 10/02/2010  . NEPHROLITHIASIS 05/23/2009  . BARRETTS ESOPHAGUS 02/20/2009  . Gastroparesis 02/20/2009  . RADICULOPATHY 10/21/2008  . GERD 10/08/2008  . ESOPHAGITIS 08/15/2008  . ESOPHAGEAL STRICTURE 08/15/2008  . GASTRITIS, ACUTE 08/15/2008  . RENAL CYST 08/14/2008  . TOBACCO ABUSE 08/08/2008  . HEMATURIA, MICROSCOPIC, HX OF 08/08/2008  . PULMONARY NODULE 01/10/2008    Carey Bullocks, OTR/L 08/21/2015, 5:10 PM  [facility] [address] [place], [place], [address] Phone: [phone]   Fax:  [phone]

## 2015-08-25 ENCOUNTER — Ambulatory Visit: Payer: Federal, State, Local not specified - PPO

## 2015-08-25 ENCOUNTER — Ambulatory Visit: Payer: Federal, State, Local not specified - PPO | Admitting: Physical Therapy

## 2015-08-25 ENCOUNTER — Encounter: Payer: Self-pay | Admitting: Neurology

## 2015-08-25 ENCOUNTER — Ambulatory Visit (INDEPENDENT_AMBULATORY_CARE_PROVIDER_SITE_OTHER): Payer: Federal, State, Local not specified - PPO | Admitting: Neurology

## 2015-08-25 ENCOUNTER — Ambulatory Visit: Payer: Federal, State, Local not specified - PPO | Admitting: Occupational Therapy

## 2015-08-25 VITALS — BP 131/88 | HR 68 | Ht 74.0 in | Wt 269.4 lb

## 2015-08-25 DIAGNOSIS — I639 Cerebral infarction, unspecified: Secondary | ICD-10-CM | POA: Diagnosis not present

## 2015-08-25 DIAGNOSIS — G4730 Sleep apnea, unspecified: Secondary | ICD-10-CM | POA: Insufficient documentation

## 2015-08-25 DIAGNOSIS — I6381 Other cerebral infarction due to occlusion or stenosis of small artery: Secondary | ICD-10-CM

## 2015-08-25 HISTORY — DX: Other cerebral infarction due to occlusion or stenosis of small artery: I63.81

## 2015-08-25 NOTE — Progress Notes (Signed)
[facility] [address] [place]. [place] [address] [phone]       OFFICE FOLLOW-UP NOTE  [name] Date of Birth:  [birth year] Medical Record Number:  [id number]   HPI: Mr Bozman is a 6 year African-American male seen today for first office follow-up visit following hospital admission for stroke in September 2016. QUENTYN KOLBECK is an 64 y.o. male with a history of hypertension, Barrett's esophagus, GERD and chronic back pain, presenting  On 08/02/2015 with new onset speech output difficulty and mild right-sided weakness. Patient had word finding difficulty stuttering but no dysarthria. Patient was last known well at about 3:30 AM. When he woke up again at about 8:30 deficits were present. Patient is not aware of any previous stroke or TIA. 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 NIH stroke score was 1.LSN: 3:30 AM on 08/02/2015 tPA Given: No: Minimal deficits and beyond time window for treatment consideration. CT scan of the head showed no acute abnormality, only chronic microvascular ischemic changes and old left basal ganglia infarct. MRI scan showed an acute infarct in the left basal ganglia and the radiating white matter tracts. Intracranial MRA showed no large vessel stenosis. LDL cholesterol was elevated at 1 month 5 mg percent. Hemoglobin A1c was 5.5. Transthoracic echocardiogram showed normal ejection fraction of 50-55% without cardiac source of embolism. Carotid ultrasound showed no significant extracranial stenosis. Patient was started on aspirin for stroke prevention and continued on losartan for hypertension and started pravastatin for elevated lipids. He states his done well except he still has some fine motor skills difficulties in the right hand. He is working with outpatient therapy and states his 95% better. His tolerating aspirin without bleeding or bruising and states his blood pressure is quite  good and today it is 131/88. On inquiry the patient admits to snoring, sleeping difficulties, daytime sleepiness. He was referred for a sleep study by his pain physician but he did not make that appointment. The patient is not taking protocol which was prescribed as he wants to try red yeast rice which is started. He plans to have follow-up lipid profile checked next month.  ROS:   14 system review of systems is positive for shortness of breath, numbness, headache, depression, anxiety, not enough sleep and decreased energy, change in appetite, racing thoughts, snoring, insomnia, sleepiness and all other systems negative  PMH:  Past Medical History  Diagnosis Date  . GERD (gastroesophageal reflux disease)   . Stricture and stenosis of esophagus   . Gastritis     Mild  . Barrett's esophagus   . Depression   . Gastroparesis   . Hyperglycemia   . Renal cyst   . Nephrolithiasis   . Chronic back pain   . Headache   . Vision abnormalities   . Stroke [facility])     Social History:  Social History   Social History  . Marital Status: Married    Spouse Name: N/A  . Number of Children: 3  . Years of Education: N/A   Occupational History  . retired [employer]   Social History Main Topics  . Smoking status: Former Smoker -- 0.80 packs/day    Types: Cigarettes    Quit date: 08/02/2015  . Smokeless tobacco: Never Used     Comment: Counseling sheet given 3/15  . Alcohol Use: No  . Drug Use: No  . Sexual Activity: Not on file  Other Topics Concern  . Not on file   Social History Narrative    Medications:   Current Outpatient Prescriptions on File Prior to Visit  Medication Sig Dispense Refill  . aspirin 325 MG tablet Take 1 tablet (325 mg total) by mouth daily.    Marland Kitchen losartan (COZAAR) 25 MG tablet TAKE 1 TABLET BY MOUTH EVERY DAY 90 tablet 2  . NEXIUM 40 MG capsule Take 1 capsule (40 mg total) by mouth daily. 90 capsule 3  . ondansetron (ZOFRAN) 8 MG tablet Take 1 tablet by  mouth as needed for nausea.   5  . oxymorphone (OPANA ER) 20 MG 12 hr tablet Take 20 mg by mouth daily.     No current facility-administered medications on file prior to visit.    Allergies:  No Known Allergies  Physical Exam General: well developed, well nourished middle-aged African-American male, seated, in no evident distress Head: head normocephalic and atraumatic.  Neck: supple with no carotid or supraclavicular bruits Cardiovascular: regular rate and rhythm, no murmurs Musculoskeletal: no deformity Skin:  no rash/petichiae Vascular:  Normal pulses all extremities Filed Vitals:   08/25/15 1440  BP: 131/88  Pulse: 68   Neurologic Exam Mental Status: Awake and fully alert. Oriented to place and time. Recent and remote memory intact. Attention span, concentration and fund of knowledge appropriate. Mood and affect appropriate.  Cranial Nerves: Fundoscopic exam reveals sharp disc margins. Pupils equal, briskly reactive to light. Extraocular movements full without nystagmus. Visual fields full to confrontation. Hearing intact. Facial sensation intact. Face, tongue, palate moves normally and symmetrically.  Motor: Normal bulk and tone. Normal strength in all tested extremity muscles. Diminished fine finger movements on the right. Orbits left over right upper extremity. Sensory.: intact to touch ,pinprick .position and vibratory sensation.  Coordination: Rapid alternating movements normal in all extremities. Finger-to-nose and heel-to-shin performed accurately bilaterally. Gait and Station: Arises from chair without difficulty. Stance is normal. Gait demonstrates normal stride length and balance . Able to heel, toe and tandem walk without difficulty.  Reflexes: 1+ and symmetric. Toes downgoing.   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 I also advised the patient to eat a healthy diet with plenty of whole grains, cereals, fruits and vegetables, exercise regularly and maintain ideal body weight .Also check polysomnogram for sleep apnea.Greater than 50% of time during this 25 minute visit was spent on counseling,explanation of diagnosis, planning of further management, discussion with patient and family and coordination of care Followup in the future with me in  6 months or call earlier if necessary.   Antony Contras, MD  Note: This document was prepared with digital dictation and possible smart phrase technology. Any transcriptional errors that result from this process are unintentional

## 2015-08-25 NOTE — Patient Instructions (Signed)
I had a long d/w patient and his wife about his recent stroke, risk for recurrent stroke/TIAs, personally independently reviewed imaging studies and stroke evaluation results and answered questions.Continue aspirin 325 mg orally every day  for secondary stroke prevention and maintain strict control of hypertension with blood pressure goal below 130/90, diabetes with hemoglobin A1c goal below 6.5% and lipids with LDL cholesterol goal below 100 mg/dL. I also advised the patient to eat a healthy diet with plenty of whole grains, cereals, fruits and vegetables, exercise regularly and maintain ideal body weight .Also check polysomnogram for sleep apnea. Followup in the future with me in  6 months or call earlier if necessary.  Stroke Prevention Some medical conditions and behaviors are associated with an increased chance of having a stroke. You may prevent a stroke by making healthy choices and managing medical conditions. HOW CAN I REDUCE MY RISK OF HAVING A STROKE?   Stay physically active. Get at least 30 minutes of activity on most or all days.  Do not smoke. It may also be helpful to avoid exposure to secondhand smoke.  Limit alcohol use. Moderate alcohol use is considered to be:  No more than 2 drinks per day for men.  No more than 1 drink per day for nonpregnant women.  Eat healthy foods. This involves:  Eating 5 or more servings of fruits and vegetables a day.  Making dietary changes that address high blood pressure (hypertension), high cholesterol, diabetes, or obesity.  Manage your cholesterol levels.  Making food choices that are high in fiber and low in saturated fat, trans fat, and cholesterol may control cholesterol levels.  Take any prescribed medicines to control cholesterol as directed by your health care provider.  Manage your diabetes.  Controlling your carbohydrate and sugar intake is recommended to manage diabetes.  Take any prescribed medicines to control diabetes as  directed by your health care provider.  Control your hypertension.  Making food choices that are low in salt (sodium), saturated fat, trans fat, and cholesterol is recommended to manage hypertension.  Ask your health care provider if you need treatment to lower your blood pressure. Take any prescribed medicines to control hypertension as directed by your health care provider.  If you are 97-74 years of age, have your blood pressure checked every 3-5 years. If you are 70 years of age or older, have your blood pressure checked every year.  Maintain a healthy weight.  Reducing calorie intake and making food choices that are low in sodium, saturated fat, trans fat, and cholesterol are recommended to manage weight.  Stop drug abuse.  Avoid taking birth control pills.  Talk to your health care provider about the risks of taking birth control pills if you are over 61 years old, smoke, get migraines, or have ever had a blood clot.  Get evaluated for sleep disorders (sleep apnea).  Talk to your health care provider about getting a sleep evaluation if you snore a lot or have excessive sleepiness.  Take medicines only as directed by your health care provider.  For some people, aspirin or blood thinners (anticoagulants) are helpful in reducing the risk of forming abnormal blood clots that can lead to stroke. If you have the irregular heart rhythm of atrial fibrillation, you should be on a blood thinner unless there is a good reason you cannot take them.  Understand all your medicine instructions.  Make sure that other conditions (such as anemia or atherosclerosis) are addressed. SEEK IMMEDIATE MEDICAL CARE IF:  You have sudden weakness or numbness of the face, arm, or leg, especially on one side of the body.  Your face or eyelid droops to one side.  You have sudden confusion.  You have trouble speaking (aphasia) or understanding.  You have sudden trouble seeing in one or both  eyes.  You have sudden trouble walking.  You have dizziness.  You have a loss of balance or coordination.  You have a sudden, severe headache with no known cause.  You have new chest pain or an irregular heartbeat. Any of these symptoms may represent a serious problem that is an emergency. Do not wait to see if the symptoms will go away. Get medical help at once. Call your local emergency services (911 in [place] Do not drive yourself to the hospital.   This information is not intended to replace advice given to you by your health care provider. Make sure you discuss any questions you have with your health care provider.   Document Released: 12/09/2004 Document Revised: 11/22/2014 Document Reviewed: 05/04/2013 Elsevier Interactive Patient Education [employer].

## 2015-08-26 ENCOUNTER — Ambulatory Visit: Payer: Federal, State, Local not specified - PPO

## 2015-08-26 DIAGNOSIS — IMO0002 Reserved for concepts with insufficient information to code with codable children: Secondary | ICD-10-CM

## 2015-08-26 DIAGNOSIS — R269 Unspecified abnormalities of gait and mobility: Secondary | ICD-10-CM | POA: Diagnosis not present

## 2015-08-26 DIAGNOSIS — R4701 Aphasia: Secondary | ICD-10-CM

## 2015-08-26 NOTE — Therapy (Signed)
[facility] [address] [place], [place], [address] Phone: [phone]   Fax:  [phone]  Speech Language Pathology Treatment  Patient Details  Name: Richard Vaughn MRN: [id number] Date of Birth: 06/21/1951 Referring Provider:  Rosine Abe, DO  Encounter Date: 08/26/2015    Past Medical History  Diagnosis Date  . GERD (gastroesophageal reflux disease)   . Stricture and stenosis of esophagus   . Gastritis     Mild  . Barrett's esophagus   . Depression   . Gastroparesis   . Hyperglycemia   . Renal cyst   . Nephrolithiasis   . Chronic back pain   . Headache   . Vision abnormalities   . Stroke [facility])     Past Surgical History  Procedure Laterality Date  . Lumbar disc surgery  2005, 2010    replaced L4 and L5  . Cervical disc surgery  2012    There were no vitals filed for this visit.  Visit Diagnosis: Dysarthria due to cerebrovascular accident  Aphasia      Subjective Assessment - 08/26/15 1111    Subjective "My speech is improving, day by day."               ADULT SLP TREATMENT - 08/26/15 1112    General Information   Behavior/Cognition Alert;Cooperative;Pleasant mood   Pain Assessment   Pain Assessment 0-10   Pain Score 3    Pain Location lower back   Pain Descriptors / Indicators Constant   Pain Intervention(s) Monitored during session   Cognitive-Linquistic Treatment   Treatment focused on Dysarthria   Skilled Treatment SLP reminded pt of compensations of overarticulation and reduced rate. In short conversation tasks pt maintained compensations approx 80% of the time, with 95-100% intelligibility.  In simple and mod complex convresation outside ST room for 15 minutes with intelligibility at >95%. Pt is comfortable with discharge next visit as he will cont practicing at home at multi-sentence/conversation levels.   Assessment / Recommendations / Plan   Plan Continue with  current plan of care   Progression Toward Goals   Progression toward goals Progressing toward goals            SLP Short Term Goals - 08/26/15 1126    SLP SHORT TERM GOAL #1   Title (p) pt will complete HEP with occasional min A    Time (p) 2   Period (p) Weeks   Status (p) On-going   SLP SHORT TERM GOAL #2   Title (p) pt will demo compensations for dysarthria in mod complex sentence responses 90%    Status (p) Achieved   SLP SHORT TERM GOAL #3   Title (p) pt will produce 100% intelligible speech in 8 minutes simple conversation   Time (p) 3   Period (p) Weeks   Status (p) On-going          SLP Long Term Goals - 08/26/15 1159    SLP LONG TERM GOAL #1   Title pt will complete HEP for dysarthria with rare min A   Time 6   Period Weeks   Status On-going   SLP LONG TERM GOAL #2   Time 6   Period Weeks   Status Achieved          Plan - 08/26/15 1202    Clinical Impression Statement Pt utilized compensations for dysarthria with  simple conversation and mod complex outside therapy room with intelligibility >95%. No overt signs of  aphasia today. Skilled ST needed to cont for one more session to maximize speech output. Pt's aphasia appears to have resolved. Discharge next session due to pt comfortable with cont practice at home.   Speech Therapy Frequency 2x / week   Duration --  7 weeks   Treatment/Interventions SLP instruction and feedback;Compensatory strategies;Patient/family education;Oral motor exercises;Functional tasks   Potential to Achieve Goals Good        Problem List Patient Active Problem List   Diagnosis Date Noted  . Lacunar infarct, acute ([place]) 08/25/2015  . Sleep apnea 08/25/2015  . Dysarthria   . CVA (cerebral infarction) 08/02/2015  . Acute hyperglycemia 08/02/2015  . Essential hypertension 12/27/2014  . Preventative health care 08/07/2013  . Epistaxis 07/28/2012  . HYPERGLYCEMIA 10/02/2010  . NEPHROLITHIASIS 05/23/2009  . BARRETTS  ESOPHAGUS 02/20/2009  . Gastroparesis 02/20/2009  . RADICULOPATHY 10/21/2008  . GERD 10/08/2008  . ESOPHAGITIS 08/15/2008  . ESOPHAGEAL STRICTURE 08/15/2008  . GASTRITIS, ACUTE 08/15/2008  . RENAL CYST 08/14/2008  . TOBACCO ABUSE 08/08/2008  . HEMATURIA, MICROSCOPIC, HX OF 08/08/2008  . PULMONARY NODULE 01/10/2008    [facility] , MS, [place]  08/26/2015, 12:15 PM  [place] [address] [place], [place], [address] Phone: [phone]   Fax:  [phone]

## 2015-08-27 ENCOUNTER — Encounter: Payer: Self-pay | Admitting: Occupational Therapy

## 2015-08-27 ENCOUNTER — Ambulatory Visit: Payer: Federal, State, Local not specified - PPO | Admitting: Physical Therapy

## 2015-08-27 ENCOUNTER — Telehealth: Payer: Self-pay | Admitting: Neurology

## 2015-08-27 ENCOUNTER — Ambulatory Visit: Payer: Federal, State, Local not specified - PPO | Admitting: Occupational Therapy

## 2015-08-27 DIAGNOSIS — G8191 Hemiplegia, unspecified affecting right dominant side: Secondary | ICD-10-CM

## 2015-08-27 DIAGNOSIS — R269 Unspecified abnormalities of gait and mobility: Secondary | ICD-10-CM | POA: Diagnosis not present

## 2015-08-27 DIAGNOSIS — R531 Weakness: Secondary | ICD-10-CM

## 2015-08-27 DIAGNOSIS — Z7409 Other reduced mobility: Secondary | ICD-10-CM

## 2015-08-27 DIAGNOSIS — I69359 Hemiplegia and hemiparesis following cerebral infarction affecting unspecified side: Secondary | ICD-10-CM

## 2015-08-27 DIAGNOSIS — R6889 Other general symptoms and signs: Secondary | ICD-10-CM

## 2015-08-27 DIAGNOSIS — IMO0002 Reserved for concepts with insufficient information to code with codable children: Secondary | ICD-10-CM

## 2015-08-27 NOTE — Therapy (Signed)
[facility] [address] [place], [place], [address] Phone: [phone]   Fax:  [phone]  Occupational Therapy Treatment  Patient Details  Name: Richard Vaughn MRN: [id number] Date of Birth: 05/29/1951 Referring Provider:  Rosine Abe, DO  Encounter Date: 08/27/2015      OT End of Session - 08/27/15 1019    Visit Number 5   Number of Visits 17   Date for OT Re-Evaluation 10/06/15   Authorization Type [employer], 75 visits combined, no auth required   OT Start Time [phone]   OT Stop Time 1015   OT Time Calculation (min) 38 min   Activity Tolerance Patient tolerated treatment well      Past Medical History  Diagnosis Date  . GERD (gastroesophageal reflux disease)   . Stricture and stenosis of esophagus   . Gastritis     Mild  . Barrett's esophagus   . Depression   . Gastroparesis   . Hyperglycemia   . Renal cyst   . Nephrolithiasis   . Chronic back pain   . Headache   . Vision abnormalities   . Stroke [facility])     Past Surgical History  Procedure Laterality Date  . Lumbar disc surgery  2005, 2010    replaced L4 and L5  . Cervical disc surgery  2012    There were no vitals filed for this visit.  Visit Diagnosis:  Lack of coordination due to stroke      Subjective Assessment - 08/27/15 0942    Subjective  I have a slight headache this morning   Pertinent History CVA 08/02/15   Limitations mild colorblindness   Patient Stated Goals improve writing, hand use, eating   Currently in Pain? Yes   Pain Score 3    Pain Location Head   Pain Descriptors / Indicators Headache   Pain Type Acute pain   Pain Onset Yesterday   Pain Frequency Constant   Aggravating Factors  nothing   Pain Relieving Factors nothing            [facility] OT Assessment - 08/27/15 0001    Coordination   Box and Blocks Rt = 46 blocks                  OT Treatments/Exercises (OP) - 08/27/15 0001    ADLs   ADL Comments Began assessing STG's and progress to date, and discussed with patient.    Fine Motor Coordination   Fine Motor Coordination Handwriting   In Hand Manipulation Training Pt copying small pegboard design manipulating 3 pegs at a time in hand for palm to finger translation.    Handwriting Pt practiced writing 2 long sentences at 90-95% legibility                  OT Short Term Goals - 08/27/15 1000    OT SHORT TERM GOAL #1   Title Pt will be independent with initial HEP.--check STGs 09/06/15   Time 4   Period Weeks   Status Achieved   OT SHORT TERM GOAL #2   Title Pt will improve coordination to be able to eat without spills and cut food mod I.   Time 4   Period Weeks   Status Achieved   OT SHORT TERM GOAL #3   Title Pt will report ability to perform grooming tasks with RUE without difficulty.   Time 4   Period Weeks   Status On-going  Still  reports mild difficulty   OT SHORT TERM GOAL #4   Title Pt will be able to write 2-3 sentences with 100% legibility.   Time 4   Period Weeks   Status On-going  08/27/15: approx. 90-95% legibility   OT SHORT TERM GOAL #5   Title Pt will improve coordination for ADLs as shown by improving score on box and blocks test by at least 6 with RUE.   Baseline R-35, L-41 blocks   Time 4   Period Weeks   Status Achieved  08/27/15 = 46 blocks           OT Long Term Goals - 08/07/15 2032    OT LONG TERM GOAL #1   Title Pt will be independent with updated HEP.--check LTGs 10/06/15   Time 8   Period Weeks   Status New   OT LONG TERM GOAL #2   Title Pt will be able to perform simple cooking/home maintenance tasks mod I.   Time 8   Period Weeks   Status New   OT LONG TERM GOAL #3   Title Pt will be able to perform financial management tasks without assist including writing.   Time 8   Period Weeks   Status New   OT LONG TERM GOAL #4   Title Pt will improve coordination as shown by improving time on 9-hole peg test by  at least 8sec with RUE.   Baseline R-34.25sec, L-27.41sec   Time 8   Period Weeks   Status New   OT LONG TERM GOAL #5   Title Pt will verbalize understanding of CVA risk factors and warning signs/symptoms.   Time 8   Period Weeks   Status New   Long Term Additional Goals   Additional Long Term Goals Yes   OT LONG TERM GOAL #6   Title Pt will be able to perform IADLs/simulated IADL tasks for at least 69mn without rest break.               Plan - 08/27/15 1133    Clinical Impression Statement Pt met STG's #1, #2, and #5 today. Pt progressing towards remaining STG's. Pt reports increased coordination   Plan typing test/games, UBE for strength/endurance   Consulted and Agree with Plan of Care Patient        Problem List Patient Active Problem List   Diagnosis Date Noted  . Lacunar infarct, acute ([place] 08/25/2015  . Sleep apnea 08/25/2015  . Dysarthria   . CVA (cerebral infarction) 08/02/2015  . Acute hyperglycemia 08/02/2015  . Essential hypertension 12/27/2014  . Preventative health care 08/07/2013  . Epistaxis 07/28/2012  . HYPERGLYCEMIA 10/02/2010  . NEPHROLITHIASIS 05/23/2009  . BARRETTS ESOPHAGUS 02/20/2009  . Gastroparesis 02/20/2009  . RADICULOPATHY 10/21/2008  . GERD 10/08/2008  . ESOPHAGITIS 08/15/2008  . ESOPHAGEAL STRICTURE 08/15/2008  . GASTRITIS, ACUTE 08/15/2008  . RENAL CYST 08/14/2008  . TOBACCO ABUSE 08/08/2008  . HEMATURIA, MICROSCOPIC, HX OF 08/08/2008  . PULMONARY NODULE 01/10/2008    BCarey Bullocks OTR/L 08/27/2015, 11:35 AM  [place] [address]Phone: [phone]  Fax:  [phone]

## 2015-08-27 NOTE — Therapy (Signed)
[facility] [address] [place], [place], [address] Phone: [phone]   Fax:  [phone]  Physical Therapy Treatment  Patient Details  Name: Richard Vaughn MRN: [id number] Date of Birth: [birth year] Referring Provider:  Rosine Abe, DO  Encounter Date: 08/27/2015      PT End of Session - 08/27/15 1200    Visit Number 6   Number of Visits 9   Date for PT Re-Evaluation 09/06/15   Authorization Type [employer]   PT Start Time 1102   PT Stop Time 1147   PT Time Calculation (min) 45 min   Activity Tolerance Patient tolerated treatment well   Behavior During Therapy WFL for tasks assessed/performed      Past Medical History  Diagnosis Date  . GERD (gastroesophageal reflux disease)   . Stricture and stenosis of esophagus   . Gastritis     Mild  . Barrett's esophagus   . Depression   . Gastroparesis   . Hyperglycemia   . Renal cyst   . Nephrolithiasis   . Chronic back pain   . Headache   . Vision abnormalities   . Stroke [facility])     Past Surgical History  Procedure Laterality Date  . Lumbar disc surgery  2005, 2010    replaced L4 and L5  . Cervical disc surgery  2012    There were no vitals filed for this visit.  Visit Diagnosis:  Hemiparesis due to recent cerebral infarction [facility])  Abnormality of gait  Right hemiparesis (HCC)  Decreased strength, endurance, and mobility      Subjective Assessment - 08/27/15 1105    Subjective Pt reports no significant changes other than "slight headache" in L aspect of head since last night. Pt had appt with Dr. Leonie Man and pt feels as though it went well. Pt expressing desire to return to aquatic exercise class with wife.   Currently in Pain? Yes   Pain Score 4   Pain decreased to 1/10 within this session   Pain Location Head   Pain Orientation Left;Mid;Upper   Pain Descriptors / Indicators Headache   Pain Type Acute pain   Pain Onset Yesterday    Pain Frequency Constant   Aggravating Factors  pt unaware of aggravating factors   Pain Relieving Factors pt unaware   Multiple Pain Sites No            [facility] PT Assessment - 08/27/15 0001    Ambulation/Gait   Gait velocity 3.63 ft/sec   Standardized Balance Assessment   Standardized Balance Assessment --   Berg Balance Test   Sit to Stand Able to stand without using hands and stabilize independently   Standing Unsupported Able to stand safely 2 minutes   Sitting with Back Unsupported but Feet Supported on Floor or Stool Able to sit safely and securely 2 minutes   Stand to Sit Sits safely with minimal use of hands   Transfers Able to transfer safely, minor use of hands   Standing Unsupported with Eyes Closed Able to stand 10 seconds safely   Standing Ubsupported with Feet Together Able to place feet together independently and stand 1 minute safely   From Standing, Reach Forward with Outstretched Arm Can reach confidently >25 cm (10")   From Standing Position, Pick up Object from Floor Able to pick up shoe safely and easily   From Standing Position, Turn to Look Behind Over each Shoulder Looks behind from both sides and weight shifts  well   Turn 360 Degrees Able to turn 360 degrees safely in 4 seconds or less   Standing Unsupported, Alternately Place Feet on Step/Stool Able to stand independently and safely and complete 8 steps in 20 seconds   Standing Unsupported, One Foot in Front Able to place foot tandem independently and hold 30 seconds   Standing on One Leg Able to lift leg independently and hold > 10 seconds   Total Score 56   Timed Up and Go Test   TUG Normal TUG   Normal TUG (seconds) 9.7                     [facility] Adult PT Treatment/Exercise - 08/27/15 0001    Ambulation/Gait   Ambulation/Gait Yes   Ambulation/Gait Assistance 7: Independent   Ambulation Distance (Feet) 500 Feet   Assistive device None   Gait Pattern Within Functional Limits   Ambulation  Surface Level;Indoor   Dynamic Gait Index   Level Surface Normal   Change in Gait Speed Normal   Gait with Horizontal Head Turns Normal   Gait with Vertical Head Turns Normal   Gait and Pivot Turn Normal   Step Over Obstacle Mild Impairment   Step Around Obstacles Normal   Steps Mild Impairment  single rail to descend only   Total Score 22   Self-Care   Self-Care Other Self-Care Comments   Other Self-Care Comments  Discussed goals, progress, and D/C plan. Recommended pt monitor vital signs (wife is RN and consistently monitors vitals) with exercise. Discussed progression of gaze stabilization exercises with verbal understanding from pt.   Neuro Re-ed    Neuro Re-ed Details  Completed Berg, DGI, and TUG; see outcome measures for detailed findings.   Exercises   Exercises Other Exercises   Other Exercises  Explained and demonstrated supine neck stretch to increase extensibility of suboccipital musculature 6 x 10-second holds with effective return demonstration from pt.   Manual Therapy   Manual Therapy Myofascial release   Manual therapy comments Noted point tenderness, reproduction of concordant headache pain (parietal) with palpation of suboccipital musculature (L > R).   Myofascial Release Supine: suboccipital release x4 minutes. Contract-relax PNF alternating with prolonged passive stretch of L anterior scalene x1 min, middle scalene x1 minutes. Pt reported decrease in pain from 4/10 to 1/10 post-manual therapy.                PT Education - 08/27/15 1208    Education provided Yes   Education Details HEP: discussed progression of VOR x1 viewing; added neck stretch. Discussed PT goals, findings, and D/C plan.   Person(s) Educated Patient   Methods Explanation;Demonstration;Verbal cues;Handout;Tactile cues   Comprehension Verbalized understanding;Returned demonstration             PT Long Term Goals - 08/27/15 1129    PT LONG TERM GOAL #1   Title Incr. Berg score to  >/= 46/56 to decr. fall risk.  (09-06-15)   Baseline 10/12: Berg score = 56/56   Time 4   Period Weeks   Status Achieved   PT LONG TERM GOAL #2   Title Improve TUG score to </= 10.0 secs for reduced fall risk.  (09-06-15)   Baseline 10/12: 9.70 seconds.   Time 4   Period Weeks   Status Achieved   PT LONG TERM GOAL #3   Title Incr. gait velocity to >/= 3.7 ft/sec without device for incr. gait efficiency.  (09-06-15)   Baseline 10/12:  3.63 ft/sec   Time 4   Period Weeks   Status Not Met   PT LONG TERM GOAL #4   Title Independent in HEP for high level balance exercises.  (09-06-15)   Baseline Met 9/30   Time 4   Period Weeks   Status Achieved               Plan - 08/27/15 1226    Clinical Impression Statement Pt has met all goals with exception of gait speed (current gait speed is 3.63 ft/sec and goal is for 3.7 ft/sec). Pt improved Berg score from 41/56 to 56/56 and scored a 22/24 on DGI, suggesting decreased fall risk and significant improvement in functional standing balance and dynamic gait stability. Therefore, pt will be discharged from outpatient PT at this time. Pt verbalized understanding and was in full agreement with discharge plan.   Pt will benefit from skilled therapeutic intervention in order to improve on the following deficits Abnormal gait;Decreased endurance;Decreased activity tolerance;Decreased balance;Decreased mobility;Decreased strength;Decreased coordination   Rehab Potential Good   PT Frequency 2x / week   PT Duration 4 weeks   PT Treatment/Interventions ADLs/Self Care Home Management;Functional mobility training;Stair training;Gait training;Therapeutic activities;Therapeutic exercise;Balance training;Neuromuscular re-education;Patient/family education   PT Next Visit Plan N/A, as pt is discharge from outpatient PT at this time   Consulted and Agree with Plan of Care Patient        Problem List Patient Active Problem List   Diagnosis Date Noted   . Lacunar infarct, acute ([place]) 08/25/2015  . Sleep apnea 08/25/2015  . Dysarthria   . CVA (cerebral infarction) 08/02/2015  . Acute hyperglycemia 08/02/2015  . Essential hypertension 12/27/2014  . Preventative health care 08/07/2013  . Epistaxis 07/28/2012  . HYPERGLYCEMIA 10/02/2010  . NEPHROLITHIASIS 05/23/2009  . BARRETTS ESOPHAGUS 02/20/2009  . Gastroparesis 02/20/2009  . RADICULOPATHY 10/21/2008  . GERD 10/08/2008  . ESOPHAGITIS 08/15/2008  . ESOPHAGEAL STRICTURE 08/15/2008  . GASTRITIS, ACUTE 08/15/2008  . RENAL CYST 08/14/2008  . TOBACCO ABUSE 08/08/2008  . HEMATURIA, MICROSCOPIC, HX OF 08/08/2008  . PULMONARY NODULE 01/10/2008    PHYSICAL THERAPY DISCHARGE SUMMARY  Visits from Start of Care: 6  Current functional level related to goals / functional outcomes: See all goals and goal statuses above.   Remaining deficits: Pt reports/exhibits need to decrease gait speed to negotiate obstacles and uses hand rail to descend stairs due to perception of "unsteadiness".   Education / Equipment: HEP and safe progression; CVA warning signs and pertinent risk factors; community resources Plan: Patient agrees to discharge.  Patient goals were met. Patient is being discharged due to  meeting majority of PT goals and pt being pleased with current functional status.         Billie Ruddy, PT, DPT [facility] [address] [place], [place], [address] Phone: [phone]   Fax:  [phone] 08/27/2015, 12:32 PM

## 2015-08-27 NOTE — Telephone Encounter (Signed)
I am not seeing the patient for this problem and hence recommend he see family physician and get his advice for the same

## 2015-08-27 NOTE — Telephone Encounter (Signed)
Pt's wife called stating he is having anxiety. He is not sleeping thru the night, waking up every hour or every 2 hours since 08/04/15. He is requesting something for sleep. Please call and advise. She can be reached at [phone].

## 2015-08-27 NOTE — Patient Instructions (Signed)
   Neck Stretch -Fold up a towel and position yourself on your back -Place towel at the base of your skull and relax your neck -Tuck your chin. Hold for 10 seconds. Relax. Repeat 5 times. -Perform as needed for headaches.

## 2015-08-27 NOTE — Telephone Encounter (Signed)
Rn call patients wife about her husband not sleeping well and night and having anxiety. Pt was just seen on 08-25-15 with Dr.Sethi for a follow up. Pts wife stated her husband had the issue prior to the visit but forgot to ask Dr. Leonie Man about it. Rn stated to pts wife that Dr.Sethi is seeing patients,and will give her a husband a call at [phone].

## 2015-08-28 NOTE — Telephone Encounter (Signed)
Rn talk to Richard Vaughn about needing something for sleep. Pt stated he saw his PCP last week and he was told to follow up concerning his sleep. Rn stated to patient that a referral was put in for a sleep study at [facility]. Rn stated Dr. Leonie Man will not be able to prescribed anything at this time. Patient stated he will call his PCP to be evaluate. Rn stated that the sleep staff did call and leave a vm for the patient. Pt stated he was unaware and will check the vm.

## 2015-09-02 ENCOUNTER — Ambulatory Visit: Payer: Federal, State, Local not specified - PPO | Admitting: Speech Pathology

## 2015-09-02 ENCOUNTER — Ambulatory Visit: Payer: Federal, State, Local not specified - PPO | Admitting: Physical Therapy

## 2015-09-02 ENCOUNTER — Encounter: Payer: Self-pay | Admitting: Occupational Therapy

## 2015-09-02 ENCOUNTER — Ambulatory Visit: Payer: Federal, State, Local not specified - PPO | Admitting: Occupational Therapy

## 2015-09-02 DIAGNOSIS — IMO0002 Reserved for concepts with insufficient information to code with codable children: Secondary | ICD-10-CM

## 2015-09-02 DIAGNOSIS — I69359 Hemiplegia and hemiparesis following cerebral infarction affecting unspecified side: Secondary | ICD-10-CM

## 2015-09-02 DIAGNOSIS — R269 Unspecified abnormalities of gait and mobility: Secondary | ICD-10-CM | POA: Diagnosis not present

## 2015-09-02 NOTE — Patient Instructions (Signed)
  Rub cheek upwards from lip to upper ear -3 sets of 5x  Tap cheek upwards  Rub washcloth upwards lightly  Can tap or rub ice in cloth or cold wash cloth upwards   Pull lips inward in a circle - 5  Tap lips in circle clockwise 5x then counter clockwise 5xz  Ice in cloth tap lips as above

## 2015-09-02 NOTE — Therapy (Signed)
[facility] [address] [place], [place], [address] Phone: [phone]   Fax:  [phone]  Speech Language Pathology Treatment  Patient Details  Name: Richard Vaughn MRN: [id number] Date of Birth: Apr 03, 1951 No Data Recorded  Encounter Date: 09/02/2015      End of Session - 09/02/15 1155    Visit Number 6   Number of Visits 17   Date for SLP Re-Evaluation 10/07/15   SLP Start Time 1104   SLP Stop Time  1143   SLP Time Calculation (min) 39 min      Past Medical History  Diagnosis Date  . GERD (gastroesophageal reflux disease)   . Stricture and stenosis of esophagus   . Gastritis     Mild  . Barrett's esophagus   . Depression   . Gastroparesis   . Hyperglycemia   . Renal cyst   . Nephrolithiasis   . Chronic back pain   . Headache   . Vision abnormalities   . Stroke [facility])     Past Surgical History  Procedure Laterality Date  . Lumbar disc surgery  2005, 2010    replaced L4 and L5  . Cervical disc surgery  2012    There were no vitals filed for this visit.  Visit Diagnosis: Dysarthria due to cerebrovascular accident      Subjective Assessment - 09/02/15 1106    Subjective "It's going well"   Currently in Pain? Yes   Pain Score 3    Pain Location Back   Pain Descriptors / Indicators Aching   Pain Type Chronic pain   Pain Onset Yesterday   Pain Frequency Constant               ADULT SLP TREATMENT - 09/02/15 1117    General Information   Behavior/Cognition Alert;Cooperative;Pleasant mood   Pain Assessment   Pain Assessment 0-10   Pain Score 3    Pain Descriptors / Indicators Constant   Pain Intervention(s) Monitored during session   Cognitive-Linquistic Treatment   Treatment focused on Dysarthria   Skilled Treatment Pt verbalized compensations for dysarthria. Pt trained in buccal and labial massage per his request for facial numbness - with rare min cues. Pt demonstrated   compensations for dysarthria during strcutured speech tasks repeating multisyllabic words then generating sentence with word - pt used compensations 95%o f tasks with supervision cues. Mildly complex conversation over 15 minutes pt 100%.intelligible.   Assessment / Recommendations / Plan   Plan Continue with current plan of care   Progression Toward Goals   Progression toward goals Progressing toward goals          SLP Education - 09/02/15 1135    Education provided Yes   Education Details HEP for dysarthria, continue HEP and compensations for dysarthria, d/c plan   Person(s) Educated Patient   Methods Explanation;Verbal cues;Handout   Comprehension Verbalized understanding;Returned demonstration     .SPEECH THERAPY DISCHARGE SUMMARY  Visits from Start of Care: 6  Current functional level related to goals / functional outcomes: See goals below   Remaining deficits: Slight slur, especially with fatigue or stress   Education / Equipment: Pt to continue HEP  And compensations for dysarthria upon d/c Plan: Patient agrees to discharge.  Patient goals were met. Patient is being discharged due to meeting the stated rehab goals.  ?????        SLP Short Term Goals - 09/02/15 1155    SLP SHORT TERM GOAL #3  Status Achieved          SLP Long Term Goals - 09/02/15 1155    SLP LONG TERM GOAL #1   Title pt will complete HEP for dysarthria with rare min A   Status Achieved   SLP LONG TERM GOAL #2   Title pt will produce 10 minutes intelligible mod complex conversation with rare min A   Status Achieved          Plan - 09/02/15 1154    Clinical Impression Statement Pt utlizing compensations for dysarthria with mod I, Pt 100% intellgiible at moderately complex level with supervision cues. I recommend pt be d/c'd from ST at this time.         Problem List Patient Active Problem List   Diagnosis Date Noted  . Lacunar infarct, acute ([place]) 08/25/2015  . Sleep apnea  08/25/2015  . Dysarthria   . CVA (cerebral infarction) 08/02/2015  . Acute hyperglycemia 08/02/2015  . Essential hypertension 12/27/2014  . Preventative health care 08/07/2013  . Epistaxis 07/28/2012  . HYPERGLYCEMIA 10/02/2010  . NEPHROLITHIASIS 05/23/2009  . BARRETTS ESOPHAGUS 02/20/2009  . Gastroparesis 02/20/2009  . RADICULOPATHY 10/21/2008  . GERD 10/08/2008  . ESOPHAGITIS 08/15/2008  . ESOPHAGEAL STRICTURE 08/15/2008  . GASTRITIS, ACUTE 08/15/2008  . RENAL CYST 08/14/2008  . TOBACCO ABUSE 08/08/2008  . HEMATURIA, MICROSCOPIC, HX OF 08/08/2008  . PULMONARY NODULE 01/10/2008    Chaquetta Schlottman, Annye Rusk 09/02/2015, 11:57 AM  [facility] [address] [place], [place], [address] Phone: [phone]   Fax:  [phone]   Name: MISTER KRAHENBUHL MRN: [id number] Date of Birth: 1951-10-12

## 2015-09-02 NOTE — Patient Instructions (Signed)
Strengthening: Resisted Flexion   Attach tube to door.  Hold tubing with one arm at side. Pull forward and up. Move shoulder through pain-free range of motion. Repeat 15-20 times per set.  Do 1-2 sessions per day.    Strengthening: Resisted Extension   Attach one end to door.  Hold tubing in one hand, arm forward. Pull arm back, elbow straight. Repeat 15-20 times per set. Do 1-2 sessions per day.   Resisted Horizontal Abduction: Bilateral   Sit or stand, tubing in both hands, arms out in front. Keeping arms straight, pinch shoulder blades together and stretch arms out. Repeat 15-20 times per set.  Do 1-2 sessions per day.    Elbow Extension: Resisted   Hold band in left hand with elbow bent. Straighten  right elbow. Repeat 15-20 times per set.  Do 1-2 sessions per day.    ABDUCTION: Sitting - Resistance Band (Active)    Sit with right arm down. Hold resistance band in right arm with thumb facing up, lift arm out to side and up as high as possible, keeping elbow straight. Complete 15-20 repetitions. Perform 1-2 sessions per day.  Copyright  VHI. All rights reserved.  EXTERNAL ROTATION: Standing - Stable: Exercise Band (Active)    Stand, right arm bent to 90, elbow against side, hand forward. Against red resistance band, rotate forearm outward, keeping elbow at side. Rotate forearm outward as far as possible. Complete 15-20 repetitions. Perform 1-2 sessions per day.  Copyright  VHI. All rights reserved.

## 2015-09-02 NOTE — Therapy (Signed)
[facility] [address] [place], [place], [address] Phone: [phone]   Fax:  [phone]  Occupational Therapy Treatment  Patient Details  Name: Richard Vaughn MRN: [id number] Date of Birth: 1951-01-02 No Data Recorded  Encounter Date: 09/02/2015      OT End of Session - 09/02/15 1050    Visit Number 6   Number of Visits 17   Date for OT Re-Evaluation 10/06/15   Authorization Type [place], 75 visits combined, no auth required   OT Start Time 1020   OT Stop Time 1100   OT Time Calculation (min) 40 min   Activity Tolerance Patient tolerated treatment well   Behavior During Therapy [facility] for tasks assessed/performed      Past Medical History  Diagnosis Date  . GERD (gastroesophageal reflux disease)   . Stricture and stenosis of esophagus   . Gastritis     Mild  . Barrett's esophagus   . Depression   . Gastroparesis   . Hyperglycemia   . Renal cyst   . Nephrolithiasis   . Chronic back pain   . Headache   . Vision abnormalities   . Stroke [facility])     Past Surgical History  Procedure Laterality Date  . Lumbar disc surgery  2005, 2010    replaced L4 and L5  . Cervical disc surgery  2012    There were no vitals filed for this visit.  Visit Diagnosis:  Hemiparesis due to recent cerebral infarction [facility])  Lack of coordination due to stroke      Subjective Assessment - 09/02/15 1025    Subjective  "I've been getting better"  Pt reports that he plans to go back to water classes next week.   Pertinent History CVA 08/02/15   Limitations mild colorblindness   Patient Stated Goals improve writing, hand use, eating   Currently in Pain? Yes   Pain Score 3    Pain Location Back   Pain Orientation Lower   Pain Descriptors / Indicators Aching   Pain Type Chronic pain   Aggravating Factors  unaware   Pain Relieving Factors ?pain meds                      OT Treatments/Exercises (OP)  - 09/02/15 0001    ADLs   ADL Comments Began checking LTGs and discussing progress with possible d/c per pt.  See goals section for 9-hole peg test.   Fine Motor Coordination   Fine Motor Coordination Grooved pegs   Grooved pegs with good accuracy/speed      Arm bike x31min level 4 for conditioning with min cues to maintain >40rpms and no rest break (forward/backwards).          OT Education - 09/02/15 1309    Education Details Red band HEP    Person(s) Educated Patient   Methods Explanation;Demonstration;Handout   Comprehension Verbalized understanding;Returned demonstration          OT Short Term Goals - 08/27/15 1000    OT SHORT TERM GOAL #1   Title Pt will be independent with initial HEP.--check STGs 09/06/15   Time 4   Period Weeks   Status Achieved   OT SHORT TERM GOAL #2   Title Pt will improve coordination to be able to eat without spills and cut food mod I.   Time 4   Period Weeks   Status Achieved   OT SHORT TERM GOAL #3   Title  Pt will report ability to perform grooming tasks with RUE without difficulty.   Time 4   Period Weeks   Status On-going  Still reports mild difficulty   OT SHORT TERM GOAL #4   Title Pt will be able to write 2-3 sentences with 100% legibility.   Time 4   Period Weeks   Status On-going  08/27/15: approx. 90-95% legibility   OT SHORT TERM GOAL #5   Title Pt will improve coordination for ADLs as shown by improving score on box and blocks test by at least 6 with RUE.   Baseline R-35, L-41 blocks   Time 4   Period Weeks   Status Achieved  08/27/15 = 46 blocks           OT Long Term Goals - 09/02/15 1031    OT LONG TERM GOAL #1   Title Pt will be independent with updated HEP.--check LTGs 10/06/15   Time 8   Period Weeks   Status New   OT LONG TERM GOAL #2   Title Pt will be able to perform simple cooking/home maintenance tasks mod I.   Time 8   Period Weeks   Status New   OT LONG TERM GOAL #3   Title Pt will be  able to perform financial management tasks without assist including writing.   Time 8   Period Weeks   Status New   OT LONG TERM GOAL #4   Title Pt will improve coordination as shown by improving time on 9-hole peg test by at least 8sec with RUE.   Baseline R-34.25sec, L-27.41sec   Time 8   Period Weeks   Status Achieved  09/02/15:  26.84sec   OT LONG TERM GOAL #5   Title Pt will verbalize understanding of CVA risk factors and warning signs/symptoms.   Time 8   Period Weeks   Status New   OT LONG TERM GOAL #6   Title Pt will be able to perform IADLs/simulated IADL tasks for at least 55min without rest break.   Time 8   Period Weeks   Status On-going               Plan - 09/02/15 1307    Clinical Impression Statement Pt has been progressing well.  Pt reports continued fatigues and fine motor difficulty.   Plan activity tolerance, d/c vs. continue   Consulted and Agree with Plan of Care Patient        Problem List Patient Active Problem List   Diagnosis Date Noted  . Lacunar infarct, acute ([place]) 08/25/2015  . Sleep apnea 08/25/2015  . Dysarthria   . CVA (cerebral infarction) 08/02/2015  . Acute hyperglycemia 08/02/2015  . Essential hypertension 12/27/2014  . Preventative health care 08/07/2013  . Epistaxis 07/28/2012  . HYPERGLYCEMIA 10/02/2010  . NEPHROLITHIASIS 05/23/2009  . BARRETTS ESOPHAGUS 02/20/2009  . Gastroparesis 02/20/2009  . RADICULOPATHY 10/21/2008  . GERD 10/08/2008  . ESOPHAGITIS 08/15/2008  . ESOPHAGEAL STRICTURE 08/15/2008  . GASTRITIS, ACUTE 08/15/2008  . RENAL CYST 08/14/2008  . TOBACCO ABUSE 08/08/2008  . HEMATURIA, MICROSCOPIC, HX OF 08/08/2008  . PULMONARY NODULE 01/10/2008    [facility] 09/02/2015, 1:13 PM  [facility] [address] [place], [place], [address] Phone: [phone]   Fax:  [phone]  Name: Richard Vaughn MRN: [id number] Date of Birth:  06-Oct-1951  Vianne Bulls, OTR/L 09/02/2015 1:13 PM

## 2015-09-03 ENCOUNTER — Encounter: Payer: Self-pay | Admitting: Family Medicine

## 2015-09-03 ENCOUNTER — Ambulatory Visit (INDEPENDENT_AMBULATORY_CARE_PROVIDER_SITE_OTHER): Payer: Federal, State, Local not specified - PPO | Admitting: Family Medicine

## 2015-09-03 VITALS — BP 130/84 | HR 77 | Temp 98.1°F | Wt 272.0 lb

## 2015-09-03 DIAGNOSIS — F5102 Adjustment insomnia: Secondary | ICD-10-CM | POA: Diagnosis not present

## 2015-09-03 MED ORDER — TRAZODONE HCL 50 MG PO TABS: 50.0000 mg | ORAL_TABLET | Freq: Every day | ORAL | Status: DC

## 2015-09-03 NOTE — Progress Notes (Signed)
   Subjective:    Patient ID: Richard Vaughn, male    DOB: [birth year], 64 y.o.   MRN: [id number]  HPI Patient had recent stroke. He has recovered well but is having problems with insomnia. He has difficulty falling asleep and frequently early morning waking. Denies any depression. He feels slightly anxious. Recently only getting about 3-4 hours per night. No caffeine use in the afternoon or at night. No alcohol use. He tried some type of over-the-counter herbal supplement without relief. He is feeling very tired during the day because of lack of sleep. He states he has never had difficulties with sleep prior to his stroke. Does sometimes watch TV at night.  Past Medical History  Diagnosis Date  . GERD (gastroesophageal reflux disease)   . Stricture and stenosis of esophagus   . Gastritis     Mild  . Barrett's esophagus   . Depression   . Gastroparesis   . Hyperglycemia   . Renal cyst   . Nephrolithiasis   . Chronic back pain   . Headache   . Vision abnormalities   . Stroke [facility])    Past Surgical History  Procedure Laterality Date  . Lumbar disc surgery  2005, 2010    replaced L4 and L5  . Cervical disc surgery  2012    reports that he quit smoking about 4 weeks ago. His smoking use included Cigarettes. He smoked 0.80 packs per day. He has never used smokeless tobacco. He reports that he does not drink alcohol or use illicit drugs. family history includes Hypertension in his brother, father, mother, and sister; Liver cancer in his mother; Stroke in his father, sister, and sister. There is no history of Colon cancer, Esophageal cancer, Rectal cancer, or Stomach cancer. No Known Allergies    Review of Systems  Constitutional: Negative for appetite change and unexpected weight change.  Respiratory: Negative for shortness of breath.   Cardiovascular: Negative for chest pain.  Psychiatric/Behavioral: Positive for sleep disturbance. Negative for dysphoric mood. The patient is  nervous/anxious.        Objective:   Physical Exam  Constitutional: He is oriented to person, place, and time. He appears well-developed and well-nourished.  Cardiovascular: Normal rate and regular rhythm.   Pulmonary/Chest: Effort normal and breath sounds normal. No respiratory distress. He has no wheezes. He has no rales.  Neurological: He is alert and oriented to person, place, and time.  Psychiatric: He has a normal mood and affect. His behavior is normal. Judgment and thought content normal.          Assessment & Plan:  Transient insomnia. This is likely related to his recent stroke. He has some mild anxiety symptoms. Sleep hygiene discussed with handout given. Trazodone 50 mg daily at bedtime. Touch base in a couple weeks if not improving Avoid daytime naps. No caffeine after 1 to 2  PM. He will resume water aerobic exercises

## 2015-09-03 NOTE — Progress Notes (Signed)
Pre visit review using our clinic review tool, if applicable. No additional management support is needed unless otherwise documented below in the visit note. 

## 2015-09-03 NOTE — Patient Instructions (Signed)
Insomnia Insomnia is a sleep disorder that makes it difficult to fall asleep or to stay asleep. Insomnia can cause tiredness (fatigue), low energy, difficulty concentrating, mood swings, and poor performance at work or school.  There are three different ways to classify insomnia:  Difficulty falling asleep.  Difficulty staying asleep.  Waking up too early in the morning. Any type of insomnia can be long-term (chronic) or short-term (acute). Both are common. Short-term insomnia usually lasts for three months or less. Chronic insomnia occurs at least three times a week for longer than three months. CAUSES  Insomnia may be caused by another condition, situation, or substance, such as:  Anxiety.  Certain medicines.  Gastroesophageal reflux disease (GERD) or other gastrointestinal conditions.  Asthma or other breathing conditions.  Restless legs syndrome, sleep apnea, or other sleep disorders.  Chronic pain.  Menopause. This may include hot flashes.  Stroke.  Abuse of alcohol, tobacco, or illegal drugs.  Depression.  Caffeine.   Neurological disorders, such as Alzheimer disease.  An overactive thyroid (hyperthyroidism). The cause of insomnia may not be known. RISK FACTORS Risk factors for insomnia include:  Gender. Women are more commonly affected than men.  Age. Insomnia is more common as you get older.  Stress. This may involve your professional or personal life.  Income. Insomnia is more common in people with lower income.  Lack of exercise.   Irregular work schedule or night shifts.  Traveling between different time zones. SIGNS AND SYMPTOMS If you have insomnia, trouble falling asleep or trouble staying asleep is the main symptom. This may lead to other symptoms, such as:  Feeling fatigued.  Feeling nervous about going to sleep.  Not feeling rested in the morning.  Having trouble concentrating.  Feeling irritable, anxious, or depressed. TREATMENT   Treatment for insomnia depends on the cause. If your insomnia is caused by an underlying condition, treatment will focus on addressing the condition. Treatment may also include:   Medicines to help you sleep.  Counseling or therapy.  Lifestyle adjustments. HOME CARE INSTRUCTIONS   Take medicines only as directed by your health care provider.  Keep regular sleeping and waking hours. Avoid naps.  Keep a sleep diary to help you and your health care provider figure out what could be causing your insomnia. Include:   When you sleep.  When you wake up during the night.  How well you sleep.   How rested you feel the next day.  Any side effects of medicines you are taking.  What you eat and drink.   Make your bedroom a comfortable place where it is easy to fall asleep:  Put up shades or special blackout curtains to block light from outside.  Use a white noise machine to block noise.  Keep the temperature cool.   Exercise regularly as directed by your health care provider. Avoid exercising right before bedtime.  Use relaxation techniques to manage stress. Ask your health care provider to suggest some techniques that may work well for you. These may include:  Breathing exercises.  Routines to release muscle tension.  Visualizing peaceful scenes.  Cut back on alcohol, caffeinated beverages, and cigarettes, especially close to bedtime. These can disrupt your sleep.  Do not overeat or eat spicy foods right before bedtime. This can lead to digestive discomfort that can make it hard for you to sleep.  Limit screen use before bedtime. This includes:  Watching TV.  Using your smartphone, tablet, and computer.  Stick to a routine. This   can help you fall asleep faster. Try to do a quiet activity, brush your teeth, and go to bed at the same time each night.  Get out of bed if you are still awake after 15 minutes of trying to sleep. Keep the lights down, but try reading or  doing a quiet activity. When you feel sleepy, go back to bed.  Make sure that you drive carefully. Avoid driving if you feel very sleepy.  Keep all follow-up appointments as directed by your health care provider. This is important. SEEK MEDICAL CARE IF:   You are tired throughout the day or have trouble in your daily routine due to sleepiness.  You continue to have sleep problems or your sleep problems get worse. SEEK IMMEDIATE MEDICAL CARE IF:   You have serious thoughts about hurting yourself or someone else.   This information is not intended to replace advice given to you by your health care provider. Make sure you discuss any questions you have with your health care provider.   Document Released: 10/29/2000 Document Revised: 07/23/2015 Document Reviewed: 08/02/2014 Elsevier Interactive Patient Education 2016 [employer].  

## 2015-09-04 ENCOUNTER — Encounter: Payer: Federal, State, Local not specified - PPO | Admitting: Occupational Therapy

## 2015-09-04 ENCOUNTER — Encounter: Payer: Federal, State, Local not specified - PPO | Admitting: Speech Pathology

## 2015-09-04 ENCOUNTER — Ambulatory Visit: Payer: Federal, State, Local not specified - PPO | Admitting: Physical Therapy

## 2015-09-18 ENCOUNTER — Encounter: Payer: Self-pay | Admitting: Occupational Therapy

## 2015-09-18 ENCOUNTER — Ambulatory Visit: Payer: Federal, State, Local not specified - PPO | Attending: Internal Medicine | Admitting: Occupational Therapy

## 2015-09-18 DIAGNOSIS — Z7409 Other reduced mobility: Secondary | ICD-10-CM | POA: Insufficient documentation

## 2015-09-18 DIAGNOSIS — I69359 Hemiplegia and hemiparesis following cerebral infarction affecting unspecified side: Secondary | ICD-10-CM | POA: Diagnosis not present

## 2015-09-18 DIAGNOSIS — R6889 Other general symptoms and signs: Secondary | ICD-10-CM

## 2015-09-18 DIAGNOSIS — R531 Weakness: Secondary | ICD-10-CM

## 2015-09-18 NOTE — Therapy (Signed)
[facility] [address] [place], [place], [address] Phone: [phone]   Fax:  [phone]  Occupational Therapy Treatment  Patient Details  Name: Richard Vaughn MRN: [id number] Date of Birth: 1951/02/11 No Data Recorded  Encounter Date: 09/18/2015      OT End of Session - 09/18/15 1529    Visit Number 7   Number of Visits 17   Date for OT Re-Evaluation 10/06/15   Authorization Type [place], 75 visits combined, no auth required   OT Start Time 1500   OT Stop Time 1533   OT Time Calculation (min) 33 min   Equipment Utilized During Treatment UBE   Activity Tolerance Patient tolerated treatment well      Past Medical History  Diagnosis Date  . GERD (gastroesophageal reflux disease)   . Stricture and stenosis of esophagus   . Gastritis     Mild  . Barrett's esophagus   . Depression   . Gastroparesis   . Hyperglycemia   . Renal cyst   . Nephrolithiasis   . Chronic back pain   . Headache   . Vision abnormalities   . Stroke [facility])     Past Surgical History  Procedure Laterality Date  . Lumbar disc surgery  2005, 2010    replaced L4 and L5  . Cervical disc surgery  2012    There were no vitals filed for this visit.  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 No/denies                      OT Treatments/Exercises (OP) - 09/18/15 0001    Exercises   Exercises Shoulder   Shoulder Exercises: ROM/Strengthening   UBE (Upper Arm Bike) UBE x 8 min. Level 10 for strength/endurance   Other ROM/Strengthening Exercises Reviewed theraband HEP - pt demo each x 10 reps.    Fine Motor Coordination   Fine Motor Coordination  Handwriting   Handwriting Pt practiced writing 2 long sentences at 100% legibility                  OT Short Term Goals - 09/18/15 1524    OT SHORT TERM GOAL #1   Title Pt will be independent with initial HEP.--check STGs 09/06/15   Time 4   Period Weeks   Status Achieved   OT SHORT TERM GOAL #2   Title Pt will improve coordination to be able to eat without spills and cut food mod I.   Time 4   Period Weeks   Status Achieved   OT SHORT TERM GOAL #3   Title Pt will report ability to perform grooming tasks with RUE without difficulty.   Time 4   Period Weeks   Status Achieved   OT SHORT TERM GOAL #4   Title Pt will be able to write 2-3 sentences with 100% legibility.   Time 4   Period Weeks   Status Achieved  08/27/15: approx. 90-95% legibility. 09/18/15: 100% legibility   OT SHORT TERM GOAL #5   Title Pt will improve coordination for ADLs as shown by improving score on box and blocks test by at least 6 with RUE.   Baseline  R-35, L-41 blocks   Time 4   Period Weeks   Status Achieved  08/27/15 = 46 blocks           OT Long Term Goals - 09/18/15 1526    OT LONG TERM GOAL #1   Title Pt will be independent with updated HEP.--check LTGs 10/06/15   Time 8   Period Weeks   Status Achieved   OT LONG TERM GOAL #2   Title Pt will be able to perform simple cooking/home maintenance tasks mod I.   Time 8   Period Weeks   Status Achieved   OT LONG TERM GOAL #3   Title Pt will be able to perform financial management tasks without assist including writing.   Time 8   Period Weeks   Status Achieved   OT LONG TERM GOAL #4   Title Pt will improve coordination as shown by improving time on 9-hole peg test by at least 8sec with RUE.   Baseline R-34.25sec, L-27.41sec   Time 8   Period Weeks   Status Achieved  09/02/15:  26.84sec   OT LONG TERM GOAL #5   Title Pt will verbalize understanding of CVA risk factors and warning signs/symptoms.   Time 8   Period Weeks    Status Achieved   OT LONG TERM GOAL #6   Title Pt will be able to perform IADLs/simulated IADL tasks for at least 65mn without rest break.   Time 8   Period Weeks   Status Achieved  Pt reports raking yard without rest               Plan - 09/18/15 1527    Clinical Impression Statement Pt met all STG's and LTG's. Pt reports performing all normal daily tasks with little to no difficulty   Plan D/C O.TDonnajean Lopesand Agree with Plan of Care Patient        Problem List Patient Active Problem List   Diagnosis Date Noted  . Lac[place]ar infarct, acute ([place] 08/25/2015  . Sleep apnea 08/25/2015  . Dysarthria   . CVA (cerebral infarction) 08/02/2015  . Acute hyperglycemia 08/02/2015  . Essential hypertension 12/27/2014  . Preventative health care 08/07/2013  . Epistaxis 07/28/2012  . HYPERGLYCEMIA 10/02/2010  . NEPHROLITHIASIS 05/23/2009  . BARRETTS ESOPHAGUS 02/20/2009  . Gastroparesis 02/20/2009  . RADICULOPATHY 10/21/2008  . GERD 10/08/2008  . ESOPHAGITIS 08/15/2008  . ESOPHAGEAL STRICTURE 08/15/2008  . GASTRITIS, ACUTE 08/15/2008  . RENAL CYST 08/14/2008  . TOBACCO ABUSE 08/08/2008  . HEMATURIA, MICROSCOPIC, HX OF 08/08/2008  . PULMONARY NODULE 01/10/2008    OCCUPATIONAL THERAPY DISCHARGE SUMMARY  Visits from Start of Care: 7  Current functional level related to goals / functional outcomes: SEE ABOVE   Remaining deficits: Mild strength/endurance Mild coordination   Education / Equipment: HEP's, CVA education  Plan: Patient agrees to discharge.  Patient goals were met. Patient is being discharged due to meeting the stated rehab goals.  ?????       BCarey Bullocks OTR/L 09/18/2015, 3:31 PM  [id number] [address]Phone: [phone]  Fax:  [phone] Name: Richard PFIFFNERMRN: [id number] of Birth: [birth year]

## 2015-09-23 ENCOUNTER — Encounter: Payer: Self-pay | Admitting: Neurology

## 2015-09-23 ENCOUNTER — Other Ambulatory Visit: Payer: Federal, State, Local not specified - PPO

## 2015-09-23 ENCOUNTER — Ambulatory Visit (INDEPENDENT_AMBULATORY_CARE_PROVIDER_SITE_OTHER): Payer: Federal, State, Local not specified - PPO | Admitting: Neurology

## 2015-09-23 VITALS — BP 122/76 | HR 78 | Resp 20 | Ht 74.0 in | Wt 274.0 lb

## 2015-09-23 DIAGNOSIS — I63032 Cerebral infarction due to thrombosis of left carotid artery: Secondary | ICD-10-CM

## 2015-09-23 DIAGNOSIS — G4733 Obstructive sleep apnea (adult) (pediatric): Secondary | ICD-10-CM | POA: Insufficient documentation

## 2015-09-23 DIAGNOSIS — G4710 Hypersomnia, unspecified: Secondary | ICD-10-CM | POA: Insufficient documentation

## 2015-09-23 DIAGNOSIS — R0683 Snoring: Secondary | ICD-10-CM | POA: Diagnosis not present

## 2015-09-23 DIAGNOSIS — G4730 Sleep apnea, unspecified: Secondary | ICD-10-CM | POA: Diagnosis not present

## 2015-09-23 HISTORY — DX: Cerebral infarction due to thrombosis of left carotid artery: I63.032

## 2015-09-23 NOTE — Progress Notes (Signed)
[facility] [address] [place]. [place] [address] [phone]       Sleep medicine clinic  [name] Date of Birth:  November 01, 1951 Medical Record Number:  [id number]    [name] is a 64 year old African-American gentleman right-handed who suffered a stroke on 08-02-15 with mild right dominant weakness and clumsiness remaining and some hesitation of speech. He was after the stroke sleepier than usual and has only partially recovered to his baseline. His motor impairment has recovered very well\  [name] wife has noted him to snore and she has witnessed apneas. Her husband states that he doesn't have an established bedtime but usually watches TV late into the night and goes to bed around midnight or later. He usually falls asleep in front of the TV before he transfers to the bedroom. He prefers to sleep on his left side, on 2 pillows. When he wakes up he still finds himself on the side he reports. His wife has noticed that there is a positional component to his snoring when in supine. The bedroom is described as cool, quiet and dark. His wife stated that he actually watches TV further in the bedroom so this will be part of our sleep hygiene discussion today. Sometimes he would not be asleep before 4 AM. He rises usually around 10 AM and does not use an alarm. On average she has about 2 bathroom breaks at night. He does not recall being broken by dreams, headaches or heartburn. There is no restless leg evidence.      Dr Leonie Man:  HPI: [name] is a 66 year African-American male seen today for first office follow-up visit following hospital admission for stroke in September 2016. Richard Vaughn is an 64 y.o. male with a history of hypertension, Barrett's esophagus, GERD and chronic back pain, presenting  On 08/02/2015 with new onset speech output difficulty and mild right-sided weakness. Patient had word finding difficulty stuttering but no dysarthria. Patient was last  known well at about 3:30 AM. When he woke up again at about 8:30 deficits were present. Patient is not aware of any previous stroke or TIA. 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 NIH stroke score was 1.LSN: 3:30 AM on 08/02/2015 tPA Given: No: Minimal deficits and beyond time window for treatment consideration. CT scan of the head showed no acute abnormality, only chronic microvascular ischemic changes and old left basal ganglia infarct. MRI scan showed an acute infarct in the left basal ganglia and the radiating white matter tracts. Intracranial MRA showed no large vessel stenosis. LDL cholesterol was elevated at 1 month 5 mg percent. Hemoglobin A1c was 5.5. Transthoracic echocardiogram showed normal ejection fraction of 50-55% without cardiac source of embolism. Carotid ultrasound showed no significant extracranial stenosis. Patient was started on aspirin for stroke prevention and continued on losartan for hypertension and started pravastatin for elevated lipids. He states his done well except he still has some fine motor skills difficulties in the right hand. He is working with outpatient therapy and states his 95% better. His tolerating aspirin without bleeding or bruising and states his blood pressure is quite good and today it is 131/88. On inquiry the patient admits to snoring, sleeping difficulties, daytime sleepiness. He was referred for a sleep study by his pain physician but he did not make that appointment. The patient is not taking protocol which was prescribed as he wants to try red yeast rice  which is started. He plans to have follow-up lipid profile checked next month.  ROS:   14 system review of systems is positive for shortness of breath, numbness, headache, depression, anxiety, not enough sleep and decreased energy, change in appetite, racing thoughts, snoring, insomnia, sleepiness and all other systems negative  Epworth 4 FSS 17 ,  GDS 2 .  PMH:  Past Medical History  Diagnosis Date  . GERD (gastroesophageal reflux disease)   . Stricture and stenosis of esophagus   . Gastritis     Mild  . Barrett's esophagus   . Depression   . Gastroparesis   . Hyperglycemia   . Renal cyst   . Nephrolithiasis   . Chronic back pain   . Headache   . Vision abnormalities   . Stroke [facility])     Social History:  Social History   Social History  . Marital Status: Married    Spouse Name: N/A  . Number of Children: 3  . Years of Education: N/A   Occupational History  . retired [employer]   Social History Main Topics  . Smoking status: Former Smoker -- 0.80 packs/day    Types: Cigarettes    Quit date: 08/02/2015  . Smokeless tobacco: Never Used     Comment: Counseling sheet given 3/15  . Alcohol Use: No  . Drug Use: No  . Sexual Activity: Not on file   Other Topics Concern  . Not on file   Social History Narrative    Medications:   Current Outpatient Prescriptions on File Prior to Visit  Medication Sig Dispense Refill  . aspirin 325 MG tablet Take 1 tablet (325 mg total) by mouth daily.    . Coenzyme Q10 (CO Q-10 PO) Take by mouth.    . losartan (COZAAR) 25 MG tablet TAKE 1 TABLET BY MOUTH EVERY DAY 90 tablet 2  . NEXIUM 40 MG capsule Take 1 capsule (40 mg total) by mouth daily. 90 capsule 3  . ondansetron (ZOFRAN) 8 MG tablet Take 1 tablet by mouth as needed for nausea.   5  . oxymorphone (OPANA ER) 20 MG 12 hr tablet Take 20 mg by mouth daily.    . Red Yeast Rice Extract (RED YEAST RICE PO) Take by mouth.    . traZODone (DESYREL) 50 MG tablet Take 1 tablet (50 mg total) by mouth at bedtime. 30 tablet 5   No current facility-administered medications on file prior to visit.    Allergies:  No Known Allergies  Physical Exam General: well developed, well nourished middle-aged African-American male, in no evident distress 18 inches neck circumference, no goiter, no TMJ, no retrognathia. Head: head  normocephalic and atraumatic.  Neck: supple with no carotid or supraclavicular bruits Cardiovascular: regular rate and rhythm, no murmurs Musculoskeletal: no deformity Skin:  no rash/petichiae Vascular:  Normal pulses all extremities Filed Vitals:   09/23/15 1411  BP: 122/76  Pulse: 78  Resp: 20   Neurologic Exam Mental Status: Awake and fully alert. Oriented to place and time. Recent and remote memory intact. Attention span, concentration and fund of knowledge appropriate. Mood and affect appropriate.  Cranial Nerves:  The patient reports changes in his taste and smell sensation. He seems to have a higher threshold for spices. Decreased olfactory sensory. Fundoscopic exam reveals sharp disc margins. Pupils equal, briskly reactive to light. Extraocular movements full without nystagmus. Visual fields full to confrontation. Hearing intact. Facial sensation intact. Face, tongue, palate moves normally and symmetrically. Bilateral shoulder  symmetric. Motor: Normal bulk and tone. Normal strength in all tested extremity muscles. The patient is a slight increase in muscle tone over the right biceps and antebrachial muscles. There is a pronator drift noted on the right only .preserved grip strength. Diminished fine finger movements on the right. Orbits left over right upper extremity. Sensory.: intact to touch ,pinprick .position and vibratory sensation.  Coordination: Rapid alternating movements normal in all extremities. Finger-to-nose and heel-to-shin performed accurately bilaterally. Gait and Station: Arises from chair without difficulty. Stance is normal.  Gait demonstrates normal stride length and balance . Able to heel, toe and tandem walk without difficulty.  Reflexes: 1+ and symmetric. Toes downgoing.   How likely are you to doze in the following situations: 0 = not likely, 1 = slight chance, 2 = moderate chance, 3 = high chance  Sitting and Reading? 0 Watching Television?1 Sitting  inactive in a public place (theater or meeting)?0 Lying down in the afternoon when circumstances permit?1 Sitting and talking to someone?0 Sitting quietly after lunch without alcohol?1 In a car, while stopped for a few minutes in traffic?0 As a passenger in a car for an hour without a break?1  Total = 4     ASSESSMENT: 63 year patient left subcortical infarct secondary to small vessel disease in September 2016 with vascular risk factors of hypertension, hyperlipidemia and suspected sleep apnea, Obstructive sleep apnea is a risk factor for embolic and thrombotic strokes. Obstructive sleep apnea if left untreated is also a risk factor for hypertension, atrial fibrillation, and elevated blood sugar levels. The patient is known to snore but not every night it seems to be related to sleeping on his back. His wife has noticed him to stop breathing in his sleep so witnessed apneas are also present. The patient has had some difficulty sleeping through the night and he had also noted daytime sleepiness. This was exacerbated by his stroke.  He reports not napping, his wife noted him to fall asleep every evening in a chair before coming to the bedroom.    PLAN:  !)  check polysomnogram for sleep apnea. The patient understands the need to address all risk factors for recurrent strokes.  The patient should be evaluated in a split-night polysomnography. He wakes up occasionally with headaches. There is no evidence of cluster headache attacks but a morning headache at the time of wakefulness. This is a dull and throbbing headache. I will order CO2 to be measured.  I had a long d/w patient and his wife about his suspected OSA in the setting of a recent stroke, risk for recurrent stroke/TIAs,   I personally independently reviewed imaging studies and stroke evaluation results and answered questions, and recommended an attendet sleep study with CO2.   A stone Dr. Clydene Fake evaluations .He has continued aspirin  325 mg orally every day  =for secondary stroke prevention and maintain strict control of hypertension with blood pressure goal below 130/90 mmHg , diabetes with hemoglobin A1c goal below 6.5% and lipids with LDL cholesterol goal below 100 mg/dL.  I also advised the patient to eat a healthy diet with plenty of whole grains, cereals, fruits and vegetables, exercise regularly and maintain ideal body weight . I advised of sleep hygiene and establish a regular bedtime.   Greater than 50% of time during this 40 minute visit was spent on counseling,explanation of diagnosis, planning of further management, discussion with patient and family and coordination of care Followup in the future with me after sleep study  or  earlier if necessary.    Cayetano Mikita, MD   Cc Dr Leonie Man, MD

## 2015-10-23 ENCOUNTER — Other Ambulatory Visit (INDEPENDENT_AMBULATORY_CARE_PROVIDER_SITE_OTHER): Payer: Federal, State, Local not specified - PPO

## 2015-10-23 DIAGNOSIS — I679 Cerebrovascular disease, unspecified: Secondary | ICD-10-CM

## 2015-10-23 LAB — LIPID PANEL
CHOLESTEROL: 150 mg/dL (ref 0–200)
HDL: 29.9 mg/dL — AB (ref >39.00)
LDL Cholesterol: 97 mg/dL (ref 0–99)
NonHDL: 120.28
TRIGLYCERIDES: 114 mg/dL (ref 0.0–149.0)
Total CHOL/HDL Ratio: 5
VLDL: 22.8 mg/dL (ref 0.0–40.0)

## 2015-10-23 LAB — HEPATIC FUNCTION PANEL
ALBUMIN: 4 g/dL (ref 3.5–5.2)
ALK PHOS: 83 U/L (ref 39–117)
ALT: 28 U/L (ref 0–53)
AST: 18 U/L (ref 0–37)
BILIRUBIN DIRECT: 0.1 mg/dL (ref 0.0–0.3)
TOTAL PROTEIN: 6.6 g/dL (ref 6.0–8.3)
Total Bilirubin: 0.5 mg/dL (ref 0.2–1.2)

## 2016-01-07 ENCOUNTER — Encounter: Payer: Self-pay | Admitting: Gastroenterology

## 2016-01-19 ENCOUNTER — Ambulatory Visit (AMBULATORY_SURGERY_CENTER): Payer: Self-pay | Admitting: *Deleted

## 2016-01-19 VITALS — Ht 74.0 in | Wt 277.0 lb

## 2016-01-19 DIAGNOSIS — K2270 Barrett's esophagus without dysplasia: Secondary | ICD-10-CM

## 2016-01-19 NOTE — Progress Notes (Signed)
No egg or soy allergy known to patient  No issues with past sedation with any surgeries  or procedures, no intubation problems  No diet pills per patient No home 02 use per patient  No blood thinners per patient    

## 2016-01-29 ENCOUNTER — Telehealth: Payer: Self-pay | Admitting: Gastroenterology

## 2016-01-29 ENCOUNTER — Ambulatory Visit (AMBULATORY_SURGERY_CENTER): Payer: Federal, State, Local not specified - PPO | Admitting: Gastroenterology

## 2016-01-29 ENCOUNTER — Encounter: Payer: Self-pay | Admitting: Gastroenterology

## 2016-01-29 VITALS — BP 120/83 | HR 57 | Temp 97.7°F | Resp 16 | Ht 74.0 in | Wt 277.0 lb

## 2016-01-29 DIAGNOSIS — K2270 Barrett's esophagus without dysplasia: Secondary | ICD-10-CM | POA: Diagnosis present

## 2016-01-29 MED ORDER — OMEPRAZOLE 40 MG PO CPDR: 40.0000 mg | DELAYED_RELEASE_CAPSULE | Freq: Every day | ORAL | Status: DC

## 2016-01-29 MED ORDER — SODIUM CHLORIDE 0.9 % IV SOLN: 500.0000 mL | INTRAVENOUS | Status: DC

## 2016-01-29 NOTE — Telephone Encounter (Signed)
Pt called about taking Omeprazole vs Nexium. Explained Omeprazole doesn't cost as much and both are very similar. Pt concerned because Omeprazole didn't work last time. He agreed to try Omeprazole again and will call back if symptoms don't improve.

## 2016-01-29 NOTE — Progress Notes (Signed)
Called to room to assist during endoscopic procedure.  Patient ID and intended procedure confirmed with present staff. Received instructions for my participation in the procedure from the performing physician.  

## 2016-01-29 NOTE — Op Note (Signed)
[facility] Patient Name: Richard Vaughn Procedure Date: 01/29/2016 9:40 AM MRN: [id number] Endoscopist: Remo Lipps P. [name] , MD Age: 65 Referring MD:  Date of Birth: 04/08/1951 Gender: Male Procedure:                Upper GI endoscopy Indications:              Follow-up of Barrett's esophagus, short segment                            diagnosed in 2014 Medicines:                Monitored Anesthesia Care Procedure:                Pre-Anesthesia Assessment:                           - Prior to the procedure, a History and Physical                            was performed, and patient medications and                            allergies were reviewed. The patient's tolerance of                            previous anesthesia was also reviewed. The risks                            and benefits of the procedure and the sedation                            options and risks were discussed with the patient.                            All questions were answered, and informed consent                            was obtained. Prior Anticoagulants: The patient has                            taken aspirin, last dose was 1 day prior to                            procedure. ASA Grade Assessment: II - A patient                            with mild systemic disease. After reviewing the                            risks and benefits, the patient was deemed in                            satisfactory condition to undergo the procedure.  After obtaining informed consent, the endoscope was                            passed under direct vision. Throughout the                            procedure, the patient's blood pressure, pulse, and                            oxygen saturations were monitored continuously. The                            Model GIF-[id number] scope was introduced                            through the mouth, and advanced to the second part                       of duodenum. The upper GI endoscopy was                            accomplished without difficulty. The patient                            tolerated the procedure well. Scope In: Scope Out: Findings:      Esophagogastric landmarks were identified: the Z-line was found at 41       cm, the gastroesophageal junction was found at 41 cm and the site of       hiatal narrowing was found at 43 cm from the incisors. 2cm hiatal hernia      There were esophageal mucosal changes suspicious for Barrett's esophagus       present at the gastroesophageal junction. The maximum longitudinal       extent of these mucosal changes was < 1 cm in length. The SCJ was       irregular with an extension of a few islands of salmon colored mucosa.       Biopsies were taken with a cold forceps for histology. The esophagus was       otherwise normal.      Patchy moderate inflammation characterized by erythema was found in the       gastric body and in the gastric antrum. No focal ulcerations noted.       Biopsies were taken with a cold forceps for Helicobacter pylori testing.      The exam of the stomach was otherwise normal.      The duodenal bulb and second portion of the duodenum were normal. Complications:            No immediate complications. Estimated blood loss:                            Minimal. Estimated Blood Loss:     Estimated blood loss was minimal. Impression:               - Esophagogastric landmarks identified. 2cm hiatal  hernia                           - Esophageal mucosal changes suspicious for                            Barrett's esophagus. Biopsied.                           - Gastritis. Biopsied.                           - Normal duodenal bulb and second portion of the                            duodenum. Recommendation:           - Patient has a contact number available for                            emergencies. The signs and symptoms of  potential                            delayed complications were discussed with the                            patient. Return to normal activities tomorrow.                            Written discharge instructions were provided to the                            patient.                           - Resume previous diet.                           - Continue present medications. Increase nexium to                            twice daily for a few weeks in light of gastritis                            noted on this exam.                           - Await pathology results.                           - Repeat upper endoscopy for surveillance based on                            pathology results. Procedure Code(s):        --- Professional ---                           [phone],  Esophagogastroduodenoscopy, flexible,                            transoral; with biopsy, single or multiple CPT copyright 2016 American Medical Association. All rights reserved. Remo Lipps P. [name], MD Carlota Raspberry. Armbruster, MD 01/29/2016 9:57:12 AM This report has been signed electronically. Number of Addenda: 0

## 2016-01-29 NOTE — Progress Notes (Signed)
Dental advisory given to patient 

## 2016-01-29 NOTE — Progress Notes (Signed)
A/ox3 pleased with MAC, report to [facility] advisory given to patient

## 2016-01-29 NOTE — Patient Instructions (Signed)
YOU HAD AN ENDOSCOPIC PROCEDURE TODAY AT [facility]:   Refer to the procedure report that was given to you for any specific questions about what was found during the examination.  If the procedure report does not answer your questions, please call your gastroenterologist to clarify.  If you requested that your care partner not be given the details of your procedure findings, then the procedure report has been included in a sealed envelope for you to review at your convenience later.    Please Note:  You might notice some irritation and congestion in your nose or some drainage.  This is from the oxygen used during your procedure.  There is no need for concern and it should clear up in a day or so.  SYMPTOMS TO REPORT IMMEDIATELY:   Following upper endoscopy (EGD)  Vomiting of blood or coffee ground material  New chest pain or pain under the shoulder blades  Painful or persistently difficult swallowing  New shortness of breath  Fever of 100F or higher  Black, tarry-looking stools  For urgent or emergent issues, a gastroenterologist can be reached at any hour by calling [phone].   DIET: Your first meal following the procedure should be a small meal and then it is ok to progress to your normal diet. Heavy or fried foods are harder to digest and may make you feel nauseous or bloated.  Likewise, meals heavy in dairy and vegetables can increase bloating.  Drink plenty of fluids but you should avoid alcoholic beverages for 24 hours.  ACTIVITY:  You should plan to take it easy for the rest of today and you should NOT DRIVE or use heavy machinery until tomorrow (because of the sedation medicines used during the test).    FOLLOW UP: Our staff will call the number listed on your records the next business day following your procedure to check on you and address any questions or concerns that you may have regarding the information given to you following your procedure. If we do  not reach you, we will leave a message.  However, if you are feeling well and you are not experiencing any problems, there is no need to return our call.  We will assume that you have returned to your regular daily activities without incident.  If any biopsies were taken you will be contacted by phone or by letter within the next 1-3 weeks.  Please call us at [phone] if you have not heard about the biopsies in 3 weeks.    SIGNATURES/CONFIDENTIALITY: You and/or your care partner have signed paperwork which will be entered into your electronic medical record.  These signatures attest to the fact that that the information above on your After Visit Summary has been reviewed and is understood.  Full responsibility of the confidentiality of this discharge information lies with you and/or your care-partner.  Resume regular diet and medications Await pathology results

## 2016-01-30 ENCOUNTER — Telehealth: Payer: Self-pay

## 2016-01-30 NOTE — Telephone Encounter (Signed)
  Follow up Call-  Call back number 01/29/2016 12/20/2014  Post procedure Call Back phone  # [phone]  Permission to leave phone message Yes Yes     Patient questions:  Do you have a fever, pain , or abdominal swelling? No. Pain Score  0 *  Have you tolerated food without any problems? Yes.    Have you been able to return to your normal activities? Yes.    Do you have any questions about your discharge instructions: Diet   No. Medications  No. Follow up visit  No.  Do you have questions or concerns about your Care? No.  Actions: * If pain score is 4 or above: No action needed, pain <4.  No problems per the pt's wife.  She said he was resting.  maw

## 2016-02-06 ENCOUNTER — Encounter: Payer: Self-pay | Admitting: Family Medicine

## 2016-02-06 ENCOUNTER — Ambulatory Visit (INDEPENDENT_AMBULATORY_CARE_PROVIDER_SITE_OTHER): Payer: Federal, State, Local not specified - PPO | Admitting: Family Medicine

## 2016-02-06 VITALS — BP 128/90 | HR 96 | Temp 97.7°F | Ht 74.0 in | Wt 273.0 lb

## 2016-02-06 DIAGNOSIS — N529 Male erectile dysfunction, unspecified: Secondary | ICD-10-CM | POA: Diagnosis not present

## 2016-02-06 DIAGNOSIS — I10 Essential (primary) hypertension: Secondary | ICD-10-CM | POA: Diagnosis not present

## 2016-02-06 MED ORDER — SILDENAFIL CITRATE 100 MG PO TABS: ORAL_TABLET | ORAL | Status: DC

## 2016-02-06 NOTE — Patient Instructions (Addendum)
Continue to monitor blood pressure and be in touch if consistently > 140/90 Try Viagra 50 mg -take one hour prior to sexual activity.

## 2016-02-06 NOTE — Progress Notes (Signed)
Pre visit review using our clinic review tool, if applicable. No additional management support is needed unless otherwise documented below in the visit note. 

## 2016-02-06 NOTE — Progress Notes (Signed)
   Subjective:    Patient ID: Richard Vaughn, male    DOB: November 28, 1950, 65 y.o.   MRN: [id number]  HPI    Patient seen with concern for recent somewhat low blood pressure. He was taking low-dose losartan 25 mg daily.  Started monitoring blood pressure around the first of this month and had systolic as low as [id number]. On March 12 he stopped taking losartan. Since then blood pressures been mostly running 123456 to 123456 systolic and Q000111Q to 123XX123 diastolic. He has no orthostatic symptoms. No dizziness. No headaches. He takes chronic pain medication with  Extended release morphine but has been on this for quite some time. Hydrating well. No history of anemia.   Also complains of erectile dysfunction. Worse since he had his stroke several months ago. No nitroglycerin use. Good libido. Quit smoking. No known peripheral vascular disease.  Past Medical History  Diagnosis Date  . GERD (gastroesophageal reflux disease)   . Stricture and stenosis of esophagus   . Gastritis     Mild  . Barrett's esophagus   . Depression   . Gastroparesis   . Hyperglycemia   . Renal cyst   . Nephrolithiasis   . Chronic back pain   . Headache   . Vision abnormalities   . Stroke [facility]) 07-2015   Past Surgical History  Procedure Laterality Date  . Lumbar disc surgery  2005, 2010    replaced L4 and L5  . Cervical disc surgery  2012  . Upper gastrointestinal endoscopy    . Colonoscopy      reports that he quit smoking about 6 months ago. His smoking use included Cigarettes. He smoked 0.80 packs per day. He has never used smokeless tobacco. He reports that he does not drink alcohol or use illicit drugs. family history includes Hypertension in his brother, father, mother, and sister; Liver cancer in his mother; Stroke in his father, sister, and sister. There is no history of Colon cancer, Esophageal cancer, Rectal cancer, Stomach cancer, or Colon polyps. No Known Allergies    Review of Systems  Constitutional: Negative for  fatigue.  Eyes: Negative for visual disturbance.  Respiratory: Negative for cough, chest tightness and shortness of breath.   Cardiovascular: Negative for chest pain, palpitations and leg swelling.  Genitourinary: Negative for dysuria.  Neurological: Negative for dizziness, syncope, weakness, light-headedness and headaches.  Psychiatric/Behavioral: Negative for dysphoric mood.       Objective:   Physical Exam  Constitutional: He is oriented to person, place, and time. He appears well-developed and well-nourished.  HENT:  Right Ear: External ear normal.  Left Ear: External ear normal.  Mouth/Throat: Oropharynx is clear and moist.  Eyes: Pupils are equal, round, and reactive to light.  Neck: Neck supple. No thyromegaly present.  Cardiovascular: Normal rate and regular rhythm.   Pulmonary/Chest: Effort normal and breath sounds normal. No respiratory distress. He has no wheezes. He has no rales.  Musculoskeletal: He exhibits no edema.  Neurological: He is alert and oriented to person, place, and time.          Assessment & Plan:   History of CVA. Recent blood pressures on the low side around XX123456 systolic. Patient stopped losartan on his own. Blood pressure has been very well controlled since then. Continue to monitor closely. Be in touch if consistent readings over 140/90  Erectile dysfunction. Trial of  Viagra. 50 mg one hour prior to sexual activity.

## 2016-02-23 ENCOUNTER — Encounter: Payer: Self-pay | Admitting: Neurology

## 2016-02-23 ENCOUNTER — Ambulatory Visit (INDEPENDENT_AMBULATORY_CARE_PROVIDER_SITE_OTHER): Payer: Federal, State, Local not specified - PPO | Admitting: Neurology

## 2016-02-23 VITALS — BP 134/92 | HR 62 | Ht 74.0 in | Wt 275.6 lb

## 2016-02-23 DIAGNOSIS — I6529 Occlusion and stenosis of unspecified carotid artery: Secondary | ICD-10-CM | POA: Diagnosis not present

## 2016-02-23 NOTE — Patient Instructions (Signed)
I had a long d/w patient and his wife about his recent stroke, risk for recurrent stroke/TIAs, personally independently reviewed imaging studies and stroke evaluation results and answered questions.Continue aspirin 325 mg daily  for secondary stroke prevention and maintain strict control of hypertension with blood pressure goal below 130/90, diabetes with hemoglobin A1c goal below 6.5% and lipids with LDL cholesterol goal below 70 mg/dL. I also advised the patient to eat a healthy diet with plenty of whole grains, cereals, fruits and vegetables, exercise regularly and maintain ideal body weight .Check follow-up carotid ultrasound study. I have counseled the patient again to quit smoking completely. He was advised to seek help from his primary care physician to do so. Followup in the future with me in  one year or call earlier if necessary

## 2016-02-23 NOTE — Progress Notes (Signed)
[facility] [address] [place]. [place] [address] [phone]       OFFICE FOLLOW-UP NOTE  Richard Vaughn Date of Birth:  [birth year] Medical Record Number:  [id number]   HPI: Mr Chad is a 64 year African-American male seen today for first office follow-up visit following hospital admission for stroke in September 2016. KAEVON CORIELL is an 65 y.o. male with a history of hypertension, Barrett's esophagus, GERD and chronic back pain, presenting  On 08/02/2015 with new onset speech output difficulty and mild right-sided weakness. Patient had word finding difficulty stuttering but no dysarthria. Patient was last known well at about 3:30 AM. When he woke up again at about 8:30 deficits were present. Patient is not aware of any previous stroke or TIA. 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 NIH stroke score was 1.LSN: 3:30 AM on 08/02/2015 tPA Given: No: Minimal deficits and beyond time window for treatment consideration. CT scan of the head showed no acute abnormality, only chronic microvascular ischemic changes and old left basal ganglia infarct. MRI scan showed an acute infarct in the left basal ganglia and the radiating white matter tracts. Intracranial MRA showed no large vessel stenosis. LDL cholesterol was elevated at 1 month 5 mg percent. Hemoglobin A1c was 5.5. Transthoracic echocardiogram showed normal ejection fraction of 50-55% without cardiac source of embolism. Carotid ultrasound showed no significant extracranial stenosis. Patient was started on aspirin for stroke prevention and continued on losartan for hypertension and started pravastatin for elevated lipids. He states his done well except he still has some fine motor skills difficulties in the right hand. He is working with outpatient therapy and states his 95% better. His tolerating aspirin without bleeding or bruising and states his blood pressure is quite  good and today it is 131/88. On inquiry the patient admits to snoring, sleeping difficulties, daytime sleepiness. He was referred for a sleep study by his pain physician but he did not make that appointment. The patient is not taking protocol which was prescribed as he wants to try red yeast rice which is started. He plans to have follow-up lipid profile checked next month. Update 02/23/2016 : He returns for follow-up after last visit with me 6 months ago. He states is doing well from stroke standpoint without recurrent TIA or stroke symptoms. His blood pressure is doing well and today it is 134/92 in office. He has discontinued losartan after discussing with his primary physician as his blood pressure was running in the low 100 range consistently. He remains on aspirin to his tolerating well without bleeding or bruising. He had last lipid profile checked several months ago and LDL was 97 mg percent. Patient remains on diet controlled and is not on a statin. He is continues to smoke and has not yet quit smoking. He has seen Dr. Brett Fairy for polysomnogram but financial reason he has not yet scheduled it as he cannot afford at the present time. ROS:   14 system review of systems is positive for back pain only and all other systems negative  PMH:  Past Medical History  Diagnosis Date  . GERD (gastroesophageal reflux disease)   . Stricture and stenosis of esophagus   . Gastritis     Mild  . Barrett's esophagus   . Depression   . Gastroparesis   . Hyperglycemia   . Renal cyst   . Nephrolithiasis   . Chronic back pain   .  Headache   . Vision abnormalities   . Stroke [facility]) 07-2015    Social History:  Social History   Social History  . Marital Status: Married    Spouse Name: N/A  . Number of Children: 3  . Years of Education: N/A   Occupational History  . retired [employer]   Social History Main Topics  . Smoking status: Former Smoker -- 0.80 packs/day    Types: Cigarettes    Quit  date: 08/02/2015  . Smokeless tobacco: Never Used     Comment: Counseling sheet given 3/15  . Alcohol Use: No  . Drug Use: No  . Sexual Activity: Not on file   Other Topics Concern  . Not on file   Social History Narrative    Medications:   Current Outpatient Prescriptions on File Prior to Visit  Medication Sig Dispense Refill  . aspirin 325 MG tablet Take 1 tablet (325 mg total) by mouth daily.    Marland Kitchen omeprazole (PRILOSEC) 40 MG capsule Take 1 capsule (40 mg total) by mouth daily. 90 capsule 3  . ondansetron (ZOFRAN) 8 MG tablet Take 1 tablet by mouth as needed for nausea.   5  . oxymorphone (OPANA ER) 20 MG 12 hr tablet Take 20 mg by mouth daily.    . sildenafil (VIAGRA) 100 MG tablet Take one half to one tablet one hour prior to sexual activity. 6 tablet 6  . traZODone (DESYREL) 50 MG tablet Take 1 tablet (50 mg total) by mouth at bedtime. 30 tablet 5   No current facility-administered medications on file prior to visit.    Allergies:  No Known Allergies  Physical Exam General: well developed, well nourished middle-aged African-American male, seated, in no evident distress Head: head normocephalic and atraumatic.  Neck: supple with no carotid or supraclavicular bruits Cardiovascular: regular rate and rhythm, no murmurs Musculoskeletal: no deformity Skin:  no rash/petichiae Vascular:  Normal pulses all extremities Filed Vitals:   02/23/16 1012  BP: 134/92  Pulse: 62   Neurologic Exam Mental Status: Awake and fully alert. Oriented to place and time. Recent and remote memory intact. Attention span, concentration and fund of knowledge appropriate. Mood and affect appropriate.  Cranial Nerves: Fundoscopic exam not done.. Pupils equal, briskly reactive to light. Extraocular movements full without nystagmus. Visual fields full to confrontation. Hearing intact. Facial sensation intact. Face, tongue, palate moves normally and symmetrically.  Motor: Normal bulk and tone. Normal  strength in all tested extremity muscles. Diminished fine finger movements on the right. Orbits left over right upper extremity. Sensory.: intact to touch ,pinprick .position and vibratory sensation.  Coordination: Rapid alternating movements normal in all extremities. Finger-to-nose and heel-to-shin performed accurately bilaterally. Gait and Station: Arises from chair without difficulty. Stance is normal. Gait demonstrates normal stride length and balance . Able to heel, toe and tandem walk without difficulty.  Reflexes: 1+ and symmetric. Toes downgoing.       ASSESSMENT: 38 year patient left subcortical infarct secondary to small vessel disease in September 2016 with vascular risk factors of hypertension, hyperlipidemia and suspected sleep apnea    PLAN:  I had a long d/w patient and his wife about his recent stroke, risk for recurrent stroke/TIAs, personally independently reviewed imaging studies and stroke evaluation results and answered questions.Continue aspirin 325 mg daily  for secondary stroke prevention and maintain strict control of hypertension with blood pressure goal below 130/90, diabetes with hemoglobin A1c goal below 6.5% and lipids with LDL cholesterol goal below  70 mg/dL. I also advised the patient to eat a healthy diet with plenty of whole grains, cereals, fruits and vegetables, exercise regularly and maintain ideal body weight .Check follow-up carotid ultrasound study. I have counseled the patient again to quit smoking completely. He was advised to seek help from his primary care physician to do so. Followup in the future with me in  one year or call earlier if necessary     Antony Contras, MD  Note: This document was prepared with digital dictation and possible smart phrase technology. Any transcriptional errors that result from this process are unintentional

## 2016-03-04 ENCOUNTER — Other Ambulatory Visit: Payer: Self-pay | Admitting: Family Medicine

## 2016-03-04 NOTE — Telephone Encounter (Signed)
Rx is been sent

## 2016-03-04 NOTE — Telephone Encounter (Signed)
Pt last visit 02/06/16 Pt last refill 09/03/15 #30 with 5 refills

## 2016-03-04 NOTE — Telephone Encounter (Signed)
Refill for 6 months. 

## 2016-03-28 ENCOUNTER — Other Ambulatory Visit: Payer: Self-pay | Admitting: Internal Medicine

## 2016-05-11 ENCOUNTER — Telehealth: Payer: Self-pay | Admitting: Gastroenterology

## 2016-05-11 MED ORDER — ESOMEPRAZOLE MAGNESIUM 40 MG PO CPDR: 40.0000 mg | DELAYED_RELEASE_CAPSULE | Freq: Every day | ORAL | Status: DC

## 2016-05-11 NOTE — Telephone Encounter (Signed)
Med sent to [facility] 90 DAY SUPPLY

## 2016-06-03 ENCOUNTER — Ambulatory Visit (INDEPENDENT_AMBULATORY_CARE_PROVIDER_SITE_OTHER): Payer: Federal, State, Local not specified - PPO

## 2016-06-03 DIAGNOSIS — I6529 Occlusion and stenosis of unspecified carotid artery: Secondary | ICD-10-CM

## 2016-07-13 ENCOUNTER — Other Ambulatory Visit: Payer: Self-pay | Admitting: Family Medicine

## 2016-07-13 MED ORDER — TRAZODONE HCL 50 MG PO TABS: 50.0000 mg | ORAL_TABLET | Freq: Every day | ORAL | 0 refills | Status: DC

## 2016-07-13 NOTE — Telephone Encounter (Signed)
Originally sent in on 03/04/16 for six months.  30 day supply.  Received fax from the pharmacy.  Pt requesting 90 day supply.  Sent in for one 90 day supply.  Pt has appt scheduled on 08/06/16 for follow up.

## 2016-08-06 ENCOUNTER — Encounter: Payer: Self-pay | Admitting: Family Medicine

## 2016-08-06 ENCOUNTER — Ambulatory Visit (INDEPENDENT_AMBULATORY_CARE_PROVIDER_SITE_OTHER): Payer: Federal, State, Local not specified - PPO | Admitting: Family Medicine

## 2016-08-06 VITALS — BP 110/80 | HR 87 | Temp 98.4°F | Ht 74.0 in | Wt 263.3 lb

## 2016-08-06 DIAGNOSIS — I10 Essential (primary) hypertension: Secondary | ICD-10-CM

## 2016-08-06 DIAGNOSIS — Z8673 Personal history of transient ischemic attack (TIA), and cerebral infarction without residual deficits: Secondary | ICD-10-CM | POA: Diagnosis not present

## 2016-08-06 DIAGNOSIS — F17200 Nicotine dependence, unspecified, uncomplicated: Secondary | ICD-10-CM | POA: Diagnosis not present

## 2016-08-06 NOTE — Progress Notes (Signed)
Subjective:     Patient ID: Richard Vaughn, male   DOB: 1951-03-30, 65 y.o.   MRN: [id number]  HPI Patient seen for medical follow-up. He has history of stroke last year is unfortunately still smoking. He remains on aspirin and low-dose losartan. Blood pressure well controlled. No history of diabetes. He's had previous LDL cholesterol around 97 but he declines statin use. He is trying to follow a low saturated fat diet. He's doing some exercise a few times per week. Medications reviewed and compliant with all. He is followed by pain management and takes Opana  Past Medical History:  Diagnosis Date  . Barrett's esophagus   . Chronic back pain   . Depression   . Gastritis    Mild  . Gastroparesis   . GERD (gastroesophageal reflux disease)   . Headache   . Hyperglycemia   . Nephrolithiasis   . Renal cyst   . Stricture and stenosis of esophagus   . Stroke ([place]) 07-2015  . Vision abnormalities    Past Surgical History:  Procedure Laterality Date  . [place] SURGERY  2012  . COLONOSCOPY    . [place] SURGERY  2005, 2010   replaced L4 and L5  . UPPER GASTROINTESTINAL ENDOSCOPY      reports that he quit smoking about a year ago. His smoking use included Cigarettes. He smoked 0.80 packs per day. He has never used smokeless tobacco. He reports that he does not drink alcohol or use drugs. family history includes Hypertension in his brother, father, mother, and sister; Liver cancer in his mother; Stroke in his father, sister, and sister. No Known Allergies   Review of Systems  Constitutional: Negative for fatigue and unexpected weight change.  Eyes: Negative for visual disturbance.  Respiratory: Negative for cough, chest tightness and shortness of breath.   Cardiovascular: Negative for chest pain, palpitations and leg swelling.  Endocrine: Negative for polydipsia and polyuria.  Neurological: Negative for dizziness, syncope, weakness, light-headedness and headaches.        Objective:   Physical Exam  Constitutional: He is oriented to person, place, and time. He appears well-developed and well-nourished.  HENT:  Right Ear: External ear normal.  Left Ear: External ear normal.  Mouth/Throat: Oropharynx is clear and moist.  Eyes: Pupils are equal, round, and reactive to light.  Neck: Neck supple. No thyromegaly present.  Cardiovascular: Normal rate and regular rhythm.   Pulmonary/Chest: Effort normal and breath sounds normal. No respiratory distress. He has no wheezes. He has no rales.  Musculoskeletal: He exhibits no edema.  Neurological: He is alert and oriented to person, place, and time.       Assessment:     #1 history of CVA  #2 hypertension  #3 nicotine dependence    Plan:     -Discussed secondary prevention of stroke including importance of lipid control, blood sugar and blood pressure control and continued antiplatelet -Repeat fasting lipid. Goal LDL less than 70. We discussed possible statin use if not below 70 -Smoking cessation recommended. He has previously used Chantix without success. He'll try quitting on his own -Flu vaccine offered and declined  Eulas Post MD [facility]

## 2016-08-06 NOTE — Progress Notes (Signed)
Pre visit review using our clinic review tool, if applicable. No additional management support is needed unless otherwise documented below in the visit note. 

## 2016-08-09 ENCOUNTER — Telehealth: Payer: Self-pay

## 2016-08-09 NOTE — Telephone Encounter (Signed)
Pt consult 09/2015 orders for split still in system does he need new consult or schedule split

## 2016-08-10 ENCOUNTER — Encounter: Payer: Self-pay | Admitting: Family Medicine

## 2016-08-10 ENCOUNTER — Other Ambulatory Visit (INDEPENDENT_AMBULATORY_CARE_PROVIDER_SITE_OTHER): Payer: Federal, State, Local not specified - PPO

## 2016-08-10 DIAGNOSIS — Z8673 Personal history of transient ischemic attack (TIA), and cerebral infarction without residual deficits: Secondary | ICD-10-CM | POA: Diagnosis not present

## 2016-08-10 LAB — LIPID PANEL
CHOLESTEROL: 142 mg/dL (ref 0–200)
HDL: 36.6 mg/dL — AB (ref >39.00)
LDL CALC: 90 mg/dL (ref 0–99)
NONHDL: 105.2
Total CHOL/HDL Ratio: 4
Triglycerides: 76 mg/dL (ref 0.0–149.0)
VLDL: 15.2 mg/dL (ref 0.0–40.0)

## 2016-08-17 NOTE — Telephone Encounter (Signed)
Cyril Mourning, did you get earlier message from last week  that I sent on this patient?

## 2016-08-17 NOTE — Telephone Encounter (Signed)
Please call pt and ask if he wants to complete his sleep study (it is already ordered.) If he does not want to complete it at this time, please schedule an office visit. If he wants to complete the sleep study, please have Dawn schedule the pt. The orders are already in.

## 2016-09-20 ENCOUNTER — Telehealth: Payer: Self-pay | Admitting: Neurology

## 2016-09-20 DIAGNOSIS — G4733 Obstructive sleep apnea (adult) (pediatric): Secondary | ICD-10-CM

## 2016-09-20 NOTE — Telephone Encounter (Signed)
Patient called to schedule his sleep study 2 weeks ago and I have obtained the authorization from [facility],  There is an order in the system but it runs out on 09/22/16.  Can I get another order for this patient to get him scheduled?

## 2016-09-22 ENCOUNTER — Observation Stay (HOSPITAL_COMMUNITY): Payer: Federal, State, Local not specified - PPO

## 2016-09-22 ENCOUNTER — Telehealth: Payer: Self-pay | Admitting: Family Medicine

## 2016-09-22 ENCOUNTER — Observation Stay (HOSPITAL_COMMUNITY)
Admission: EM | Admit: 2016-09-22 | Discharge: 2016-09-23 | Disposition: A | Discharge status: Home / Self Care | Payer: Federal, State, Local not specified - PPO | Attending: Internal Medicine | Admitting: Internal Medicine

## 2016-09-22 ENCOUNTER — Emergency Department (HOSPITAL_COMMUNITY): Payer: Federal, State, Local not specified - PPO

## 2016-09-22 ENCOUNTER — Telehealth: Payer: Self-pay | Admitting: Neurology

## 2016-09-22 ENCOUNTER — Encounter (HOSPITAL_COMMUNITY): Payer: Self-pay | Admitting: Emergency Medicine

## 2016-09-22 ENCOUNTER — Other Ambulatory Visit (HOSPITAL_COMMUNITY): Payer: Federal, State, Local not specified - PPO

## 2016-09-22 DIAGNOSIS — Z7982 Long term (current) use of aspirin: Secondary | ICD-10-CM | POA: Diagnosis not present

## 2016-09-22 DIAGNOSIS — E669 Obesity, unspecified: Secondary | ICD-10-CM | POA: Insufficient documentation

## 2016-09-22 DIAGNOSIS — E785 Hyperlipidemia, unspecified: Secondary | ICD-10-CM | POA: Insufficient documentation

## 2016-09-22 DIAGNOSIS — I639 Cerebral infarction, unspecified: Secondary | ICD-10-CM

## 2016-09-22 DIAGNOSIS — R4701 Aphasia: Secondary | ICD-10-CM | POA: Diagnosis not present

## 2016-09-22 DIAGNOSIS — Z6833 Body mass index (BMI) 33.0-33.9, adult: Secondary | ICD-10-CM | POA: Diagnosis not present

## 2016-09-22 DIAGNOSIS — F17210 Nicotine dependence, cigarettes, uncomplicated: Secondary | ICD-10-CM | POA: Insufficient documentation

## 2016-09-22 DIAGNOSIS — K219 Gastro-esophageal reflux disease without esophagitis: Secondary | ICD-10-CM | POA: Insufficient documentation

## 2016-09-22 DIAGNOSIS — R202 Paresthesia of skin: Secondary | ICD-10-CM | POA: Diagnosis not present

## 2016-09-22 DIAGNOSIS — I10 Essential (primary) hypertension: Secondary | ICD-10-CM | POA: Insufficient documentation

## 2016-09-22 DIAGNOSIS — R471 Dysarthria and anarthria: Secondary | ICD-10-CM | POA: Diagnosis not present

## 2016-09-22 DIAGNOSIS — Z8673 Personal history of transient ischemic attack (TIA), and cerebral infarction without residual deficits: Secondary | ICD-10-CM | POA: Diagnosis not present

## 2016-09-22 DIAGNOSIS — G4733 Obstructive sleep apnea (adult) (pediatric): Secondary | ICD-10-CM | POA: Insufficient documentation

## 2016-09-22 DIAGNOSIS — G8929 Other chronic pain: Secondary | ICD-10-CM | POA: Diagnosis not present

## 2016-09-22 DIAGNOSIS — R200 Anesthesia of skin: Secondary | ICD-10-CM | POA: Diagnosis not present

## 2016-09-22 DIAGNOSIS — R2990 Unspecified symptoms and signs involving the nervous system: Secondary | ICD-10-CM

## 2016-09-22 DIAGNOSIS — I674 Hypertensive encephalopathy: Secondary | ICD-10-CM | POA: Diagnosis present

## 2016-09-22 DIAGNOSIS — R269 Unspecified abnormalities of gait and mobility: Secondary | ICD-10-CM

## 2016-09-22 DIAGNOSIS — F17200 Nicotine dependence, unspecified, uncomplicated: Secondary | ICD-10-CM | POA: Diagnosis present

## 2016-09-22 HISTORY — DX: Unspecified symptoms and signs involving the nervous system: R29.90

## 2016-09-22 HISTORY — DX: Cerebral infarction, unspecified: I63.9

## 2016-09-22 LAB — CBC
HEMATOCRIT: 45 % (ref 39.0–52.0)
HEMOGLOBIN: 15.4 g/dL (ref 13.0–17.0)
MCH: 31.2 pg (ref 26.0–34.0)
MCHC: 34.2 g/dL (ref 30.0–36.0)
MCV: 91.3 fL (ref 78.0–100.0)
Platelets: 203 10*3/uL (ref 150–400)
RBC: 4.93 MIL/uL (ref 4.22–5.81)
RDW: 13 % (ref 11.5–15.5)
WBC: 8 10*3/uL (ref 4.0–10.5)

## 2016-09-22 LAB — CBG MONITORING, ED: Glucose-Capillary: 144 mg/dL — ABNORMAL HIGH (ref 65–99)

## 2016-09-22 LAB — I-STAT CHEM 8, ED
BUN: 9 mg/dL (ref 6–20)
CALCIUM ION: 1.19 mmol/L (ref 1.15–1.40)
CHLORIDE: 104 mmol/L (ref 101–111)
CREATININE: 1.1 mg/dL (ref 0.61–1.24)
GLUCOSE: 107 mg/dL — AB (ref 65–99)
HCT: 47 % (ref 39.0–52.0)
Hemoglobin: 16 g/dL (ref 13.0–17.0)
Potassium: 4.9 mmol/L (ref 3.5–5.1)
Sodium: 141 mmol/L (ref 135–145)
TCO2: 25 mmol/L (ref 0–100)

## 2016-09-22 LAB — COMPREHENSIVE METABOLIC PANEL
ALBUMIN: 4.4 g/dL (ref 3.5–5.0)
ALK PHOS: 75 U/L (ref 38–126)
ALT: 24 U/L (ref 17–63)
AST: 20 U/L (ref 15–41)
Anion gap: 7 (ref 5–15)
BILIRUBIN TOTAL: 0.9 mg/dL (ref 0.3–1.2)
BUN: 8 mg/dL (ref 6–20)
CALCIUM: 9.3 mg/dL (ref 8.9–10.3)
CO2: 26 mmol/L (ref 22–32)
CREATININE: 1.19 mg/dL (ref 0.61–1.24)
Chloride: 106 mmol/L (ref 101–111)
GFR calc Af Amer: >60 mL/min (ref >60)
GLUCOSE: 108 mg/dL — AB (ref 65–99)
POTASSIUM: 4.8 mmol/L (ref 3.5–5.1)
Sodium: 139 mmol/L (ref 135–145)
TOTAL PROTEIN: 7.1 g/dL (ref 6.5–8.1)

## 2016-09-22 LAB — RAPID URINE DRUG SCREEN, HOSP PERFORMED
Amphetamines: NOT DETECTED
Barbiturates: NOT DETECTED
Benzodiazepines: NOT DETECTED
Cocaine: NOT DETECTED
Opiates: NOT DETECTED
Tetrahydrocannabinol: NOT DETECTED

## 2016-09-22 LAB — PROTIME-INR
INR: 1.03
Prothrombin Time: 13.5 seconds (ref 11.4–15.2)

## 2016-09-22 LAB — DIFFERENTIAL
BASOS ABS: 0 10*3/uL (ref 0.0–0.1)
Basophils Relative: 0 %
EOS ABS: 0.1 10*3/uL (ref 0.0–0.7)
Eosinophils Relative: 1 %
LYMPHS ABS: 2.2 10*3/uL (ref 0.7–4.0)
LYMPHS PCT: 28 %
MONOS PCT: 6 %
Monocytes Absolute: 0.5 10*3/uL (ref 0.1–1.0)
Neutro Abs: 5.1 10*3/uL (ref 1.7–7.7)
Neutrophils Relative %: 65 %

## 2016-09-22 LAB — APTT: APTT: 31 s (ref 24–36)

## 2016-09-22 LAB — I-STAT TROPONIN, ED: TROPONIN I, POC: 0 ng/mL (ref 0.00–0.08)

## 2016-09-22 MED ORDER — ENOXAPARIN SODIUM 40 MG/0.4ML ~~LOC~~ SOLN
40.0000 mg | SUBCUTANEOUS | Status: DC
  Administered 2016-09-23: 40 mg via SUBCUTANEOUS
  Filled 2016-09-22: qty 0.4

## 2016-09-22 MED ORDER — STROKE: EARLY STAGES OF RECOVERY BOOK
Freq: Once | Status: AC
  Administered 2016-09-22: 23:00:00
  Filled 2016-09-22: qty 1

## 2016-09-22 MED ORDER — TRAZODONE HCL 50 MG PO TABS
50.0000 mg | ORAL_TABLET | Freq: Every day | ORAL | Status: DC
  Administered 2016-09-22: 50 mg via ORAL
  Filled 2016-09-22: qty 1

## 2016-09-22 MED ORDER — PANTOPRAZOLE SODIUM 40 MG PO TBEC
40.0000 mg | DELAYED_RELEASE_TABLET | Freq: Every day | ORAL | Status: DC
Start: 2016-09-23 — End: 2016-09-23
  Administered 2016-09-23: 40 mg via ORAL
  Filled 2016-09-22: qty 1

## 2016-09-22 MED ORDER — ATORVASTATIN CALCIUM 80 MG PO TABS
80.0000 mg | ORAL_TABLET | Freq: Every day | ORAL | Status: DC
  Administered 2016-09-22: 80 mg via ORAL
  Filled 2016-09-22: qty 1

## 2016-09-22 MED ORDER — MORPHINE SULFATE 15 MG PO TABS
15.0000 mg | ORAL_TABLET | Freq: Three times a day (TID) | ORAL | Status: DC | PRN
  Administered 2016-09-22 – 2016-09-23 (×2): 15 mg via ORAL
  Filled 2016-09-22 (×2): qty 1

## 2016-09-22 MED ORDER — LORAZEPAM 2 MG/ML IJ SOLN
1.0000 mg | INTRAMUSCULAR | Status: AC
  Administered 2016-09-22: 1 mg via INTRAVENOUS
  Filled 2016-09-22: qty 1

## 2016-09-22 MED ORDER — ASPIRIN 325 MG PO TABS
325.0000 mg | ORAL_TABLET | Freq: Every day | ORAL | Status: DC
  Administered 2016-09-23: 325 mg via ORAL
  Filled 2016-09-22: qty 1

## 2016-09-22 MED ORDER — LORAZEPAM 1 MG PO TABS
0.5000 mg | ORAL_TABLET | Freq: Once | ORAL | Status: AC | PRN
  Administered 2016-09-22: 0.5 mg via ORAL
  Filled 2016-09-22: qty 1

## 2016-09-22 MED ORDER — SODIUM CHLORIDE 0.9 % IV SOLN
INTRAVENOUS | Status: DC
  Administered 2016-09-22: 22:00:00 via INTRAVENOUS

## 2016-09-22 MED ORDER — ACETAMINOPHEN 325 MG PO TABS
650.0000 mg | ORAL_TABLET | Freq: Four times a day (QID) | ORAL | Status: DC | PRN
  Administered 2016-09-22: 650 mg via ORAL
  Filled 2016-09-22: qty 2

## 2016-09-22 NOTE — ED Notes (Signed)
Neurology at bedside.

## 2016-09-22 NOTE — Consult Note (Signed)
Requesting Physician: Dr. Daleen Bo    Chief Complaint: Right facial numbness  History obtained from:  Patient     HPI:                                                                                                                                         Richard Vaughn is an 65 y.o. male with Tobacco use, Chronic pain, CVA (2016) presented with right facial numbness. Patient reports having right facial tingling for 2 days. He woke up today with mild right sided headache associated with right facial numbness, perioral numbness.  To me he denied any other symptoms. He denied any numbness or tingling in the right arm and leg left arm or leg. He denied any dysarthria or aphasia. He denied any diplopia. Denies any weakness in the upper and lower extremities.  Date last known well: Date: 09/20/2016 Time last known well: Unable to determine tPA Given: No: Minimal symptoms   Past Medical History:  Diagnosis Date  . Barrett's esophagus   . Chronic back pain   . Depression   . Gastritis    Mild  . Gastroparesis   . GERD (gastroesophageal reflux disease)   . Headache   . Hyperglycemia   . Nephrolithiasis   . Renal cyst   . Stricture and stenosis of esophagus   . Stroke ([place]) 07-2015  . Vision abnormalities     Past Surgical History:  Procedure Laterality Date  . [place] SURGERY  2012  . COLONOSCOPY    . [place] SURGERY  2005, 2010   replaced L4 and L5  . UPPER GASTROINTESTINAL ENDOSCOPY      Family History  Problem Relation Age of Onset  . Hypertension Father   . Stroke Father   . Hypertension Mother   . Liver cancer Mother   . Hypertension Sister     2 other sisters has also  . Stroke Sister   . Hypertension Brother     2nd brother has also  . Colon cancer Neg Hx   . Esophageal cancer Neg Hx   . Rectal cancer Neg Hx   . Stomach cancer Neg Hx   . Colon polyps Neg Hx   . Stroke Sister    Social History:  reports that he quit smoking about 13 months ago. His  smoking use included Cigarettes. He smoked 0.80 packs per day. He has never used smokeless tobacco. He reports that he does not drink alcohol or use drugs.  Allergies: No Known Allergies  Medications:  Current Facility-Administered Medications  Medication Dose Route Frequency Provider Last Rate Last Dose  .  stroke: mapping our early stages of recovery book   Does not apply Once Kinnie Feil, MD      . 0.9 %  sodium chloride infusion   Intravenous Continuous Kinnie Feil, MD      . Derrill Memo ON 09/23/2016] aspirin tablet 325 mg  325 mg Oral Daily Kinnie Feil, MD      . atorvastatin (LIPITOR) tablet 80 mg  80 mg Oral q1800 Kinnie Feil, MD      . Derrill Memo ON 09/23/2016] enoxaparin (LOVENOX) injection 40 mg  40 mg Subcutaneous Q24H Kinnie Feil, MD      . morphine (MSIR) tablet 15 mg  15 mg Oral Q8H PRN Kinnie Feil, MD   15 mg at 09/22/16 1445  . [START ON 09/23/2016] pantoprazole (PROTONIX) EC tablet 40 mg  40 mg Oral Daily Kinnie Feil, MD      . traZODone (DESYREL) tablet 50 mg  50 mg Oral QHS Kinnie Feil, MD       Current Outpatient Prescriptions  Medication Sig Dispense Refill  . aspirin 325 MG tablet Take 1 tablet (325 mg total) by mouth daily.    Marland Kitchen esomeprazole (NEXIUM) 40 MG capsule Take 1 capsule (40 mg total) by mouth daily at 12 noon. 90 capsule 3  . losartan (COZAAR) 25 MG tablet TAKE 1 TABLET BY MOUTH EVERY DAY 90 tablet 1  . oxymorphone (OPANA) 10 MG tablet Take 10 mg by mouth every 6 (six) hours as needed for pain.     . traZODone (DESYREL) 50 MG tablet Take 1 tablet (50 mg total) by mouth at bedtime. 90 tablet 0  . sildenafil (VIAGRA) 100 MG tablet Take one half to one tablet one hour prior to sexual activity. 6 tablet 6     ROS:                                                                                                                                        History obtained from the patient  General ROS: negative for - chills, fatigue, fever, night sweats, weight gain or weight loss Psychological ROS: negative for - behavioral disorder, hallucinations, memory difficulties, mood swings or suicidal ideation Ophthalmic ROS: negative for - blurry vision, double vision, eye pain or loss of vision ENT ROS: negative for - epistaxis, nasal discharge, oral lesions, sore throat, tinnitus or vertigo Allergy and Immunology ROS: negative for - hives or itchy/watery eyes Hematological and Lymphatic ROS: negative for - bleeding problems, bruising or swollen lymph nodes Endocrine ROS: negative for - galactorrhea, hair pattern changes, polydipsia/polyuria or temperature intolerance Respiratory ROS: negative for - cough, hemoptysis, shortness of breath or wheezing Cardiovascular ROS: negative for - chest pain, dyspnea on exertion, edema or irregular heartbeat Gastrointestinal ROS: negative for - abdominal pain, diarrhea,  hematemesis, nausea/vomiting or stool incontinence Genito-Urinary ROS: negative for - dysuria, hematuria, incontinence or urinary frequency/urgency Musculoskeletal ROS: negative for - joint swelling or muscular weakness Neurological ROS: as noted in HPI Dermatological ROS: negative for rash and skin lesion changes  Neurologic Examination:                                                                                                      Blood pressure 146/96, pulse 64, temperature 98.1 F (36.7 C), resp. rate 13, height 6\' 2"  (1.88 m), weight 115.7 kg (255 lb), SpO2 99 %.  HEENT-  Normocephalic, no lesions, without obvious abnormality.  Normal external eye and conjunctiva.  Normal TM's bilaterally.  Normal auditory canals and external ears. Normal external nose, mucus membranes and septum.  Normal pharynx. Cardiovascular- S1, S2 normal, pulses palpable throughout   Lungs- chest clear, no wheezing, rales, normal  symmetric air entry Abdomen- normal findings: bowel sounds normal Extremities- no edema Lymph-no adenopathy palpable Musculoskeletal-no joint tenderness, deformity or swelling Skin-warm and dry, no hyperpigmentation, vitiligo, or suspicious lesions  Neurological Examination Mental Status: Alert, oriented, thought content appropriate.  Speech fluent without evidence of aphasia.  Able to follow 3 step commands without difficulty. Cranial Nerves: II:  Visual fields grossly normal, pupils equal, round, reactive to light and accommodation III,IV, VI: ptosis not present, extra-ocular motions intact bilaterally V,VII: smile symmetric, facial light touch sensation decreased on the right VIII: hearing normal bilaterally IX,X: uvula rises symmetrically XI: bilateral shoulder shrug XII: midline tongue extension Motor: Right : Upper extremity   5/5    Left:     Upper extremity   5/5  Lower extremity   5/5     Lower extremity   5/5 Tone and bulk:normal tone throughout; no atrophy noted Sensory: Pinprick and light touch intact throughout, bilaterally Deep Tendon Reflexes: 2+ and symmetric throughout Plantars: Right: downgoing   Left: downgoing Cerebellar: normal finger-to-nose,  and normal heel-to-shin test Gait: Not tested       Lab Results: Basic Metabolic Panel:  Recent Labs Lab 09/22/16 1116 09/22/16 1127  NA 139 141  K 4.8 4.9  CL 106 104  CO2 26  --   GLUCOSE 108* 107*  BUN 8 9  CREATININE 1.19 1.10  CALCIUM 9.3  --     Liver Function Tests:  Recent Labs Lab 09/22/16 1116  AST 20  ALT 24  ALKPHOS 75  BILITOT 0.9  PROT 7.1  ALBUMIN 4.4   No results for input(s): LIPASE, AMYLASE in the last 168 hours. No results for input(s): AMMONIA in the last 168 hours.  CBC:  Recent Labs Lab 09/22/16 1116 09/22/16 1127  WBC 8.0  --   NEUTROABS 5.1  --   HGB 15.4 16.0  HCT 45.0 47.0  MCV 91.3  --   PLT 203  --     Cardiac Enzymes: No results for input(s):  CKTOTAL, CKMB, CKMBINDEX, TROPONINI in the last 168 hours.  Lipid Panel: No results for input(s): CHOL, TRIG, HDL, CHOLHDL, VLDL, LDLCALC in the last 168 hours.  CBG:  Recent  Labs Lab 09/22/16 1144  GLUCAP 144*    Microbiology: Results for orders placed or performed during the hospital encounter of 04/24/09  Urine culture     Status: None   Collection Time: 04/24/09 11:41 AM  Result Value Ref Range Status   Specimen Description URINE, RANDOM  Final   Special Requests NONE  Final   Colony Count 20,OOO COLONIES/ML  Final   Culture   Final    STAPHYLOCOCCUS SPECIES (COAGULASE NEGATIVE) Note: RIFAMPIN AND GENTAMICIN SHOULD NOT BE USED AS SINGLE DRUGS FOR TREATMENT OF STAPH INFECTIONS.   Report Status 04/27/2009 FINAL  Final   Organism ID, Bacteria STAPHYLOCOCCUS SPECIES (COAGULASE NEGATIVE)  Final      Susceptibility   Staphylococcus species (coagulase negative) - MIC    GENTAMICIN <=0.5 Sensitive     LEVOFLOXACIN >=8 Resistant     NITROFURANTOIN 32 Sensitive     OXACILLIN >=4 Resistant     PENICILLIN >=0.5 Resistant     RIFAMPIN <=0.5 Sensitive     VANCOMYCIN <=0.5 Sensitive     TETRACYCLINE <=1 Sensitive     Coagulation Studies:  Recent Labs  09/22/16 1116  LABPROT 13.5  INR 1.03    Imaging: Ct Head Wo Contrast  Result Date: 09/22/2016 CLINICAL DATA:  Right facial numbness, aphasia EXAM: CT HEAD WITHOUT CONTRAST TECHNIQUE: Contiguous axial images were obtained from the base of the skull through the vertex without intravenous contrast. COMPARISON:  08/02/2015 FINDINGS: Brain: No intracranial hemorrhage, mass effect or midline shift. Again noted atrophy and chronic white matter disease. Stable lacunar infarct in left basal ganglia. There is new area of mild decreased attenuation posterior to left basal ganglia measures about 8.5 mm. Evolving or subacute ischemic cannot be excluded. Clinical correlation is necessary. Further correlation with MRI is recommended as  clinically warranted. Vascular: Atherosclerotic calcifications of carotid siphon. Skull: No skull fracture is noted. Sinuses/Orbits: Paranasal sinuses and mastoid air cells are unremarkable. Other: Ventricular size is stable from prior exam. Again noted cavum septum pellucidum variant. IMPRESSION: 1. Again noted atrophy and chronic white matter disease. Stable lacunar infarct in left basal ganglia. There is new area of mild decreased attenuation posterior to left basal ganglia measures about 8.5 mm. Evolving or subacute ischemic cannot be excluded. Clinical correlation is necessary. Further correlation with MRI is recommended as clinically warranted. These results were called by telephone at the time of interpretation on 09/22/2016 at 12:32 pm to Dr. Zenovia Jarred , who verbally acknowledged these results. Electronically Signed   By: Lahoma Crocker M.D.   On: 09/22/2016 12:32       Assessment and plan discussed with with attending physician and they are in agreement.    Etta Quill PA-C [facility] [phone]  09/22/2016, 2:42 PM   Assessment: 65 y.o. male with 2 day history of right lower facial numbness.  Patient has had history of CVA in the past with very similar symptoms. For this reason patient was brought to the ED. Given patient's risk factor and history of stroke patient would benefit from stroke workup.  Stroke Risk Factors - hyperlipidemia   1. HgbA1c, fasting lipid panel 2. MRI, MRA  of the brain without contrast 3. PT consult, OT consult, Speech consult 4. Echocardiogram 5. Carotid dopplers 6. Prophylactic therapy-Antiplatelet med: Aspirin - dose 325 mg daily  7. Risk factor modification 8. Telemetry monitoring 9. Frequent neuro checks 10 NPO until passes stroke swallow screen 11 please page stroke NP  Or  PA  Or MD from  8am -4 pm  as this patient from this time will be  followed by the stroke.   You can look them up on [email or web address]  Password [email or web address]

## 2016-09-22 NOTE — Telephone Encounter (Addendum)
Lft vm on wifes cell number for her husband to seek the ED for the headache and tingling.

## 2016-09-22 NOTE — ED Notes (Signed)
MRI contacted about delay in patient transport.

## 2016-09-22 NOTE — ED Notes (Signed)
Patient transported to MRI 

## 2016-09-22 NOTE — H&P (Signed)
[facility] History and Physical  Richard Vaughn [id number] DOB: August 14, 1951 DOA: 09/22/2016  Referring physician:  PCP: Drema Pry, DO  Specialists:   Chief Complaint: right facial numbness   HPI: Richard Vaughn is a 65 y.o. male with PMH of HTN, HPL, Tobacco use, Chronic pain, CVA (2016) presented with right facial numbness. Patient reports having right facial tingling for 2 days. He woke up today with mild right sided headache associated with right facial numbness, perioral numbness and right arm numbness. He denies focal weaknesses, no change in speech or hearing, no dizziness, no vertigo. But felt to have gait instability today in the morning. He presented for further evaluation to ED. CT showed possible subacute infarct. He is in no distress, no focal weakness, still have some residual right facial numbness. Denies acute chest pains, no SOB, no nausea, vomiting, no abdominal pian.  -ED d/w neurology: hospitalist is called for overnight admission.   Review of Systems: The patient denies anorexia, fever, weight loss,, vision loss, decreased hearing, hoarseness, chest pain, syncope, dyspnea on exertion, peripheral edema, balance deficits, hemoptysis, abdominal pain, melena, hematochezia, severe indigestion/heartburn, hematuria, incontinence, genital sores, muscle weakness, suspicious skin lesions, transient blindness, difficulty walking, depression, unusual weight change, abnormal bleeding, enlarged lymph nodes, angioedema, and breast masses.    Past Medical History:  Diagnosis Date  . Barrett's esophagus   . Chronic back pain   . Depression   . Gastritis    Mild  . Gastroparesis   . GERD (gastroesophageal reflux disease)   . Headache   . Hyperglycemia   . Nephrolithiasis   . Renal cyst   . Stricture and stenosis of esophagus   . Stroke ([place]) 07-2015  . Vision abnormalities    Past Surgical History:  Procedure Laterality Date  . [place] SURGERY  2012  .  COLONOSCOPY    . [place] SURGERY  2005, 2010   replaced L4 and L5  . UPPER GASTROINTESTINAL ENDOSCOPY     Social History:  reports that he quit smoking about 13 months ago. His smoking use included Cigarettes. He smoked 0.80 packs per day. He has never used smokeless tobacco. He reports that he does not drink alcohol or use drugs. [place];  where does patient live--home, ALF, SNF? and with whom if at home? Yes;  Can patient participate in ADLs?  No Known Allergies  Family History  Problem Relation Age of Onset  . Hypertension Father   . Stroke Father   . Hypertension Mother   . Liver cancer Mother   . Hypertension Sister     2 other sisters has also  . Stroke Sister   . Hypertension Brother     2nd brother has also  . Colon cancer Neg Hx   . Esophageal cancer Neg Hx   . Rectal cancer Neg Hx   . Stomach cancer Neg Hx   . Colon polyps Neg Hx   . Stroke Sister     (be sure to complete)  Prior to Admission medications   Medication Sig Start Date End Date Taking? Authorizing Provider  aspirin 325 MG tablet Take 1 tablet (325 mg total) by mouth daily. 08/04/15  Yes Verlee Monte, MD  esomeprazole (NEXIUM) 40 MG capsule Take 1 capsule (40 mg total) by mouth daily at 12 noon. 05/11/16  Yes Manus Gunning, MD  losartan (COZAAR) 25 MG tablet TAKE 1 TABLET BY MOUTH EVERY DAY 03/29/16  Yes Eulas Post, MD  oxymorphone (OPANA) 10 MG  tablet Take 10 mg by mouth every 6 (six) hours as needed for pain.    Yes Historical Provider, MD  traZODone (DESYREL) 50 MG tablet Take 1 tablet (50 mg total) by mouth at bedtime. 07/13/16  Yes Eulas Post, MD  sildenafil (VIAGRA) 100 MG tablet Take one half to one tablet one hour prior to sexual activity. 02/06/16   Eulas Post, MD   Physical Exam: Vitals:   09/22/16 1315 09/22/16 1333  BP: 146/96   Pulse: 64   Resp: 13   Temp:  98.1 F (36.7 C)     General:  Alert, no distress   Eyes: eom-I, perrla   ENT: no oral ulcers    Neck: supple, no JVD  Cardiovascular: s1,s2 rrr  Respiratory: CTA BL  Abdomen: soft, nt, nd   Skin: no rash   Musculoskeletal: no leg edema,   Psychiatric: no hallucinations.   Neurologic: CN 2-12 intact, motor 5/5 BL symmetric. Sensation is decreased in right face   Labs on Admission:  Basic Metabolic Panel:  Recent Labs Lab 09/22/16 1116 09/22/16 1127  NA 139 141  K 4.8 4.9  CL 106 104  CO2 26  --   GLUCOSE 108* 107*  BUN 8 9  CREATININE 1.19 1.10  CALCIUM 9.3  --    Liver Function Tests:  Recent Labs Lab 09/22/16 1116  AST 20  ALT 24  ALKPHOS 75  BILITOT 0.9  PROT 7.1  ALBUMIN 4.4   No results for input(s): LIPASE, AMYLASE in the last 168 hours. No results for input(s): AMMONIA in the last 168 hours. CBC:  Recent Labs Lab 09/22/16 1116 09/22/16 1127  WBC 8.0  --   NEUTROABS 5.1  --   HGB 15.4 16.0  HCT 45.0 47.0  MCV 91.3  --   PLT 203  --    Cardiac Enzymes: No results for input(s): CKTOTAL, CKMB, CKMBINDEX, TROPONINI in the last 168 hours.  BNP (last 3 results) No results for input(s): BNP in the last 8760 hours.  ProBNP (last 3 results) No results for input(s): PROBNP in the last 8760 hours.  CBG:  Recent Labs Lab 09/22/16 1144  GLUCAP 144*    Radiological Exams on Admission: Ct Head Wo Contrast  Result Date: 09/22/2016 CLINICAL DATA:  Right facial numbness, aphasia EXAM: CT HEAD WITHOUT CONTRAST TECHNIQUE: Contiguous axial images were obtained from the base of the skull through the vertex without intravenous contrast. COMPARISON:  08/02/2015 FINDINGS: Brain: No intracranial hemorrhage, mass effect or midline shift. Again noted atrophy and chronic white matter disease. Stable lacunar infarct in left basal ganglia. There is new area of mild decreased attenuation posterior to left basal ganglia measures about 8.5 mm. Evolving or subacute ischemic cannot be excluded. Clinical correlation is necessary. Further correlation with MRI  is recommended as clinically warranted. Vascular: Atherosclerotic calcifications of carotid siphon. Skull: No skull fracture is noted. Sinuses/Orbits: Paranasal sinuses and mastoid air cells are unremarkable. Other: Ventricular size is stable from prior exam. Again noted cavum septum pellucidum variant. IMPRESSION: 1. Again noted atrophy and chronic white matter disease. Stable lacunar infarct in left basal ganglia. There is new area of mild decreased attenuation posterior to left basal ganglia measures about 8.5 mm. Evolving or subacute ischemic cannot be excluded. Clinical correlation is necessary. Further correlation with MRI is recommended as clinically warranted. These results were called by telephone at the time of interpretation on 09/22/2016 at 12:32 pm to Dr. Zenovia Jarred , who verbally acknowledged these  results. Electronically Signed   By: Lahoma Crocker M.D.   On: 09/22/2016 12:32    EKG: Independently reviewed.   Assessment/Plan Active Problems:   HTN (hypertension)   Stroke-like symptom   Paresthesia  65 y.o. male with PMH of HTN, HPL, Tobacco use, Chronic pain, CVA (2016) presented with right facial numbness  Paresthesias. Probable acute/subacute stroke. CT head: new area of mild decreased attenuation posterior to left basal ganglia measures about 8.5 mm. Evolving or subacute ischemic cannot be excluded. -we will obtain stroke work up with mri brain, carotid US, ecg, echo, check ha1c, urine tox. Cont aspirin, add statin, monitor on tele. If MRI is positive for stroke, may need to change antiplatelets to plavix if no arrhythmias. Stop smoking    HTN. BP is stable. Hold losartan. F/u MRI brain. Resume losartan AM as needed.    Chronic pain. Patient is on opana at home 10 mg Q6 prn. Will substitute with morphine while inpatient prn   Neurology;  if consultant consulted, please document name and whether formally or informally consulted  Code Status: full (must indicate code  status--if unknown or must be presumed, indicate so) Family Communication: d/w patient, his wife.  (indicate person spoken with, if applicable, with phone number if by telephone) Disposition Plan: home 24-48 hrs  (indicate anticipated LOS)  Time spent: >45 minutes   Kinnie Feil [facility] Pager [phone]  If 7PM-7AM, please contact night-coverage [email or web address] Password [email or web address] 09/22/2016, 1:44 PM

## 2016-09-22 NOTE — Telephone Encounter (Signed)
Pt has checked in to [facility] ED. Nothing further needed at this time.

## 2016-09-22 NOTE — Telephone Encounter (Signed)
Patient is currently in the ED for stroke like symptoms.

## 2016-09-22 NOTE — ED Notes (Signed)
Pt. Returned from MRI at this time.  

## 2016-09-22 NOTE — ED Notes (Signed)
MRI notified that patient able to get IV medication. MRI tech notified RN that he would send for patient in 10 min.

## 2016-09-22 NOTE — Telephone Encounter (Signed)
Patient's wife is calling stating when the patient woke about an hour ago he has had a headache and tingling on the right side of his head. I advised he needs to go to the ED but patient's wife said she wants advise from our office. Please call and discuss.

## 2016-09-22 NOTE — ED Provider Notes (Signed)
[facility] DEPT Provider Note   CSN: [id number] Arrival date & time: 09/22/16  1108     History   Chief Complaint Chief Complaint  Patient presents with  . Stroke Symptoms    HPI QUINDON GECKLER is a 65 y.o. male.  HPI   Patient is a 65 year old male presenting with right. Facial numbness. Patient states the last several days he is woken up and had intermittent right facial numbness. No weakness. This morning he woke up with right wrist numbness and does not go away. He told his wife and they came here to emergency department for workup for stroke. Patient's had stroke previously and is on aspirin at home.  Patient had no weakness. Patient does report mild gait instability this morning and has since resolved. Past Medical History:  Diagnosis Date  . Barrett's esophagus   . Chronic back pain   . Depression   . Gastritis    Mild  . Gastroparesis   . GERD (gastroesophageal reflux disease)   . Headache   . Hyperglycemia   . Nephrolithiasis   . Renal cyst   . Stricture and stenosis of esophagus   . Stroke ([place]) 07-2015  . Vision abnormalities     Patient Active Problem List   Diagnosis Date Noted  . Cerebrovascular accident (CVA) due to thrombosis of left carotid artery ([place]) 09/23/2015  . Primary snoring 09/23/2015  . Hypersomnia with sleep apnea 09/23/2015  . Obstructive sleep apnea 09/23/2015  . Lacunar infarct, acute ([place]) 08/25/2015  . Sleep apnea 08/25/2015  . Dysarthria   . CVA (cerebral infarction) 08/02/2015  . Acute hyperglycemia 08/02/2015  . Essential hypertension 12/27/2014  . Preventative health care 08/07/2013  . Epistaxis 07/28/2012  . HYPERGLYCEMIA 10/02/2010  . NEPHROLITHIASIS 05/23/2009  . BARRETTS ESOPHAGUS 02/20/2009  . Gastroparesis 02/20/2009  . RADICULOPATHY 10/21/2008  . GERD 10/08/2008  . ESOPHAGITIS 08/15/2008  . ESOPHAGEAL STRICTURE 08/15/2008  . GASTRITIS, ACUTE 08/15/2008  . RENAL CYST 08/14/2008  . TOBACCO ABUSE 08/08/2008    . HEMATURIA, MICROSCOPIC, HX OF 08/08/2008  . PULMONARY NODULE 01/10/2008    Past Surgical History:  Procedure Laterality Date  . [place] SURGERY  2012  . COLONOSCOPY    . [place] SURGERY  2005, 2010   replaced L4 and L5  . UPPER GASTROINTESTINAL ENDOSCOPY         Home Medications    Prior to Admission medications   Medication Sig Start Date End Date Taking? Authorizing Provider  aspirin 325 MG tablet Take 1 tablet (325 mg total) by mouth daily. 08/04/15  Yes Verlee Monte, MD  esomeprazole (NEXIUM) 40 MG capsule Take 1 capsule (40 mg total) by mouth daily at 12 noon. 05/11/16  Yes Manus Gunning, MD  losartan (COZAAR) 25 MG tablet TAKE 1 TABLET BY MOUTH EVERY DAY 03/29/16  Yes Eulas Post, MD  oxymorphone (OPANA) 10 MG tablet Take 10 mg by mouth every 6 (six) hours as needed for pain.    Yes Historical Provider, MD  traZODone (DESYREL) 50 MG tablet Take 1 tablet (50 mg total) by mouth at bedtime. 07/13/16  Yes Eulas Post, MD  sildenafil (VIAGRA) 100 MG tablet Take one half to one tablet one hour prior to sexual activity. 02/06/16   Eulas Post, MD    Family History Family History  Problem Relation Age of Onset  . Hypertension Father   . Stroke Father   . Hypertension Mother   . Liver cancer Mother   .  Hypertension Sister     2 other sisters has also  . Stroke Sister   . Hypertension Brother     2nd brother has also  . Colon cancer Neg Hx   . Esophageal cancer Neg Hx   . Rectal cancer Neg Hx   . Stomach cancer Neg Hx   . Colon polyps Neg Hx   . Stroke Sister     Social History Social History  Substance Use Topics  . Smoking status: Former Smoker    Packs/day: 0.80    Types: Cigarettes    Quit date: 08/02/2015  . Smokeless tobacco: Never Used     Comment: Counseling sheet given 3/15  . Alcohol use No     Allergies   Patient has no known allergies.   Review of Systems Review of Systems  Constitutional: Negative for  fatigue and fever.  Eyes: Negative for photophobia.  Neurological: Positive for numbness. Negative for dizziness, syncope, speech difficulty, weakness and light-headedness.  All other systems reviewed and are negative.    Physical Exam Updated Vital Signs BP 139/97 (BP Location: Right Arm)   Pulse 65   Temp 98.3 F (36.8 C) (Oral)   Resp 18   Ht 6\' 2"  (1.88 m)   Wt 255 lb (115.7 kg)   SpO2 99%   BMI 32.74 kg/m   Physical Exam  Constitutional: He is oriented to person, place, and time. He appears well-nourished.  HENT:  Head: Normocephalic.  Mouth/Throat: Oropharynx is clear and moist.  Eyes: Conjunctivae are normal.  Neck: No tracheal deviation present.  Cardiovascular: Normal rate.   Pulmonary/Chest: Effort normal and breath sounds normal. No stridor. No respiratory distress.  Abdominal: Soft. There is no tenderness. There is no guarding.  Musculoskeletal: Normal range of motion. He exhibits no edema.  Neurological: He is oriented to person, place, and time. No cranial nerve deficit.  Subjective numbness but no difference in touch between right left-hand side. No facial weakness.  Equal strength bilaterally upper and lower extremities negative pronator drift. Normal sensation bilaterally. Speech comprehensible, no slurring. Facial nerve tested and appears grossly normal. Alert and oriented 3.  Finger to nose negative.   Skin: Skin is warm and dry. No rash noted. He is not diaphoretic.  Psychiatric: He has a normal mood and affect. His behavior is normal.  Nursing note and vitals reviewed.    ED Treatments / Results  Labs (all labs ordered are listed, but only abnormal results are displayed) Labs Reviewed  COMPREHENSIVE METABOLIC PANEL - Abnormal; Notable for the following:       Result Value   Glucose, Bld 108 (*)    All other components within normal limits  CBG MONITORING, ED - Abnormal; Notable for the following:    Glucose-Capillary 144 (*)    All other  components within normal limits  I-STAT CHEM 8, ED - Abnormal; Notable for the following:    Glucose, Bld 107 (*)    All other components within normal limits  PROTIME-INR  APTT  CBC  DIFFERENTIAL  I-STAT TROPOININ, ED    EKG  EKG Interpretation  Date/Time:  Wednesday September 22 2016 11:15:21 EST Ventricular Rate:  72 PR Interval:  134 QRS Duration: 92 QT Interval:  374 QTC Calculation: 409 R Axis:   35 Text Interpretation:  Normal sinus rhythm Normal ECG No significant change since last tracing Confirmed by Gerald Leitz ([id number]) on 09/22/2016 11:16:26 AM Also confirmed by Gerald Leitz ([id number]), editor [place], Joelene Millin [phone])  on 09/22/2016  11:28:55 AM       Radiology Ct Head Wo Contrast  Result Date: 09/22/2016 CLINICAL DATA:  Right facial numbness, aphasia EXAM: CT HEAD WITHOUT CONTRAST TECHNIQUE: Contiguous axial images were obtained from the base of the skull through the vertex without intravenous contrast. COMPARISON:  08/02/2015 FINDINGS: Brain: No intracranial hemorrhage, mass effect or midline shift. Again noted atrophy and chronic white matter disease. Stable lacunar infarct in left basal ganglia. There is new area of mild decreased attenuation posterior to left basal ganglia measures about 8.5 mm. Evolving or subacute ischemic cannot be excluded. Clinical correlation is necessary. Further correlation with MRI is recommended as clinically warranted. Vascular: Atherosclerotic calcifications of carotid siphon. Skull: No skull fracture is noted. Sinuses/Orbits: Paranasal sinuses and mastoid air cells are unremarkable. Other: Ventricular size is stable from prior exam. Again noted cavum septum pellucidum variant. IMPRESSION: 1. Again noted atrophy and chronic white matter disease. Stable lacunar infarct in left basal ganglia. There is new area of mild decreased attenuation posterior to left basal ganglia measures about 8.5 mm. Evolving or subacute ischemic cannot be excluded.  Clinical correlation is necessary. Further correlation with MRI is recommended as clinically warranted. These results were called by telephone at the time of interpretation on 09/22/2016 at 12:32 pm to Dr. Zenovia Jarred , who verbally acknowledged these results. Electronically Signed   By: Lahoma Crocker M.D.   On: 09/22/2016 12:32    Procedures Procedures (including critical care time)  Medications Ordered in ED Medications - No data to display   Initial Impression / Assessment and Plan / ED Course  I have reviewed the triage vital signs and the nursing notes.  Pertinent labs & imaging results that were available during my care of the patient were reviewed by me and considered in my medical decision making (see chart for details).  Clinical Course     Patient is a 65 year old male with past medical history significant for hypertension hyperlipidemia, prior stroke in September 2016 in left basal ganglia. Patient's current smoker. On aspirin. Presenting today with facial numbness the right-hand side. Patient states this is an intermittent over the last several days but now has been constant. Since awakening. Patient's got no other notable neurological deficits including no weakness, normal finger to nose, normal facial nerve exam.  Will get EKG, troponin.  We'll touch base with neurology however believe that an MRI will ruling out acute ischemia in the ED should besufficient and the patient will be able to return home if this is not a stroke   12:42 PM CT shows subacute infarct. Discussed with neurology, Dr. Leonel Ramsay. Will admit.  Final Clinical Impressions(s) / ED Diagnoses   Final diagnoses:  None    New Prescriptions New Prescriptions   No medications on file     Caeleb Batalla Julio Alm, MD 09/22/16 1242

## 2016-09-22 NOTE — Telephone Encounter (Signed)
Patient Name: ESPIRIDION CLAEYS  DOB: 28-Feb-1951    Initial Comment Caller states husband had headache, tingling, 142/98 BP, gate is ok, jaw was sideways, no slurring of speech, equal strength.    Nurse Assessment  Nurse: Raphael Gibney, RN, Vanita Ingles Date/Time (Eastern Time): 09/22/2016 10:34:13 AM  Confirm and document reason for call. If symptomatic, describe symptoms. You must click the next button to save text entered. ---Caller states spouse has tingling in his right jaw. Woke up with headache. Equal grips. Speech slurred. Gait is ok. Pulse 73. BP 142/98 earlier and it is now 128/92. Headache pain level 4-5.  Has the patient traveled out of the country within the last 30 days? ---Not Applicable  Does the patient have any new or worsening symptoms? ---Yes  Will a triage be completed? ---Yes  Related visit to physician within the last 2 weeks? ---No  Does the PT have any chronic conditions? (i.e. diabetes, asthma, etc.) ---Yes  List chronic conditions. ---HTN  Is this a behavioral health or substance abuse call? ---No     Guidelines    Guideline Title Affirmed Question Affirmed Notes  Neurologic Deficit Headache (and neurologic deficit)    Final Disposition User   Go to ED Now Raphael Gibney, RN, Vanita Ingles    Comments  speech is not slurred   Referrals  [facility] - ED   Disagree/Comply: Comply

## 2016-09-22 NOTE — ED Notes (Signed)
Pt. Arrived with HA, numbness to right side of face, and some trouble speaking upon waking this morning. NIH of 0. CT head WO contrast showed infarct to left basal ganglia. Pt. Unable to get MRI. EDP ordered IV ativan for MRI and patient willing to try. MRI will call before they send a transporter. Pt. Neuro unchanged at this time. Neuro check of 0. No neuro checks ordered at this time. Pt. Passed swallow screen. Pt. NKDA and full code status.

## 2016-09-22 NOTE — ED Triage Notes (Signed)
Pt here with HA, numbness to right side of face and some trouble speaking starting upon waking this am

## 2016-09-22 NOTE — Telephone Encounter (Signed)
LEft vm on patients home number. Rn spoke with Dr. Leonie Man and showed him the phone message. Per Dr. Leonie Man he recommends pt seeks the hospital for the headache and tingling if it does not go away.

## 2016-09-23 ENCOUNTER — Encounter (HOSPITAL_COMMUNITY): Payer: Self-pay | Admitting: Radiology

## 2016-09-23 ENCOUNTER — Observation Stay (HOSPITAL_BASED_OUTPATIENT_CLINIC_OR_DEPARTMENT_OTHER): Payer: Federal, State, Local not specified - PPO

## 2016-09-23 ENCOUNTER — Observation Stay (HOSPITAL_COMMUNITY): Payer: Federal, State, Local not specified - PPO

## 2016-09-23 DIAGNOSIS — R51 Headache: Secondary | ICD-10-CM | POA: Diagnosis not present

## 2016-09-23 DIAGNOSIS — I10 Essential (primary) hypertension: Secondary | ICD-10-CM | POA: Diagnosis not present

## 2016-09-23 DIAGNOSIS — R2990 Unspecified symptoms and signs involving the nervous system: Secondary | ICD-10-CM | POA: Diagnosis not present

## 2016-09-23 DIAGNOSIS — R202 Paresthesia of skin: Secondary | ICD-10-CM

## 2016-09-23 DIAGNOSIS — I6789 Other cerebrovascular disease: Secondary | ICD-10-CM

## 2016-09-23 DIAGNOSIS — Z720 Tobacco use: Secondary | ICD-10-CM | POA: Diagnosis not present

## 2016-09-23 LAB — ECHOCARDIOGRAM COMPLETE
HEIGHTINCHES: 74 in
WEIGHTICAEL: 4201.09 [oz_av]

## 2016-09-23 LAB — LIPID PANEL
CHOL/HDL RATIO: 4.2 ratio
Cholesterol: 130 mg/dL (ref 0–200)
HDL: 31 mg/dL — AB (ref >40)
LDL CALC: 86 mg/dL (ref 0–99)
Triglycerides: 66 mg/dL (ref <150)
VLDL: 13 mg/dL (ref 0–40)

## 2016-09-23 MED ORDER — ATORVASTATIN CALCIUM 20 MG PO TABS: 20.0000 mg | ORAL_TABLET | Freq: Every day | ORAL | 0 refills | Status: DC

## 2016-09-23 MED ORDER — ATORVASTATIN CALCIUM 10 MG PO TABS: 20.0000 mg | ORAL_TABLET | Freq: Every day | ORAL | Status: DC

## 2016-09-23 MED ORDER — IOPAMIDOL (ISOVUE-370) INJECTION 76%
INTRAVENOUS | Status: AC
  Administered 2016-09-23: 50 mL
  Filled 2016-09-23: qty 50

## 2016-09-23 MED ORDER — NICOTINE 14 MG/24HR TD PT24
14.0000 mg | MEDICATED_PATCH | Freq: Every day | TRANSDERMAL | Status: DC
  Administered 2016-09-23: 14 mg via TRANSDERMAL
  Filled 2016-09-23: qty 1

## 2016-09-23 MED ORDER — NICOTINE 14 MG/24HR TD PT24: 14.0000 mg | MEDICATED_PATCH | Freq: Every day | TRANSDERMAL | 0 refills | Status: DC

## 2016-09-23 NOTE — Progress Notes (Signed)
Pt discharged home with wife. Discharge instructions were reviewed with pt , and also wife. Pt verbalized understanding. Arta Silence, RN  09/23/2016 4:54 PM

## 2016-09-23 NOTE — Care Management Note (Signed)
Case Management Note  Patient Details  Name: Richard Vaughn MRN: [id number] Date of Birth: May 20, 1951  Subjective/Objective:  Pt in to r/o CVA. She is from home with her spouse.                   Action/Plan: No f/u per PT. Awaiting OT recommendations. CM following for d/c needs.   Expected Discharge Date:  09/24/16               Expected Discharge Plan:  Home/Self Care  In-House Referral:     Discharge planning Services     Post Acute Care Choice:    Choice offered to:     DME Arranged:    DME Agency:     HH Arranged:    HH Agency:     Status of Service:  In process, will continue to follow  If discussed at Long Length of Stay Meetings, dates discussed:    Additional Comments:  Pollie Friar, RN 09/23/2016, 11:28 AM

## 2016-09-23 NOTE — Progress Notes (Signed)
STROKE TEAM PROGRESS NOTE   HISTORY OF PRESENT ILLNESS (per record) Richard Vaughn is an 65 y.o. male with tobacco use, chronic pain, CVA (2016) presented with right facial numbness. Patient reports having right facial tingling for 2 days. He woke up today with mild right sided headache associated with right facial numbness, perioral numbness.  To me he denied any other symptoms. He denied any numbness or tingling in the right arm and leg left arm or leg. He denied any dysarthria or aphasia. He denied any diplopia. Denies any weakness in the upper and lower extremities.  Date last known well: Date: 09/20/2016 Time last known well: Unable to determine tPA Given: No: Minimal symptoms   SUBJECTIVE (INTERVAL HISTORY) His wife is at the bedside.  Overall he feels his condition is gradually improving. He still felt right face tingling. MRI negative and CTA head and neck negative. He admitted that he has a lot of stress lately due to raising up his grandchildren.    OBJECTIVE Temp:  [97.7 F (36.5 C)-98.8 F (37.1 C)] 97.7 F (36.5 C) (11/09 0600) Pulse Rate:  [51-68] 58 (11/09 0600) Cardiac Rhythm: Sinus bradycardia (11/09 0000) Resp:  [13-18] 18 (11/09 0600) BP: (118-168)/(78-108) 126/85 (11/09 0600) SpO2:  [96 %-99 %] 99 % (11/09 0600) Weight:  [115.7 kg (255 lb)-119.1 kg (262 lb 9.1 oz)] 119.1 kg (262 lb 9.1 oz) (11/08 1928)  CBC:  Recent Labs Lab 09/22/16 1116 09/22/16 1127  WBC 8.0  --   NEUTROABS 5.1  --   HGB 15.4 16.0  HCT 45.0 47.0  MCV 91.3  --   PLT 203  --     Basic Metabolic Panel:  Recent Labs Lab 09/22/16 1116 09/22/16 1127  NA 139 141  K 4.8 4.9  CL 106 104  CO2 26  --   GLUCOSE 108* 107*  BUN 8 9  CREATININE 1.19 1.10  CALCIUM 9.3  --     Lipid Panel:    Component Value Date/Time   CHOL 130 09/23/2016 0632   TRIG 66 09/23/2016 0632   TRIG 121 11/01/2006 1056   HDL 31 (L) 09/23/2016 0632   CHOLHDL 4.2 09/23/2016 0632   VLDL 13 09/23/2016 0632   LDLCALC 86 09/23/2016 0632   HgbA1c:  Lab Results  Component Value Date   HGBA1C 5.5 08/03/2015   Urine Drug Screen:    Component Value Date/Time   LABOPIA NONE DETECTED 09/22/2016 1415   COCAINSCRNUR NONE DETECTED 09/22/2016 1415   LABBENZ NONE DETECTED 09/22/2016 1415   AMPHETMU NONE DETECTED 09/22/2016 1415   THCU NONE DETECTED 09/22/2016 1415   LABBARB NONE DETECTED 09/22/2016 1415      IMAGING I have personally reviewed the radiological images below and agree with the radiology interpretations.  Ct Head Wo Contrast 09/22/2016 1. Again noted atrophy and chronic white matter disease. Stable lacunar infarct in left basal ganglia. There is new area of mild decreased attenuation posterior to left basal ganglia measures about 8.5 mm. Evolving or subacute ischemic cannot be excluded. Clinical correlation is necessary. Further correlation with MRI is recommended as clinically warranted.   Mr Brain Wo Contrast 09/22/2016 1. Remote left-sided white matter infarcts.  2. No acute abnormality.  3. Remote lacunar infarcts of the basal ganglia are stable.  4. Multiple punctate foci of susceptibility are stable, remote microhemorrhage likely related to extensive small vessel disease.  5. Chronic left mastoid effusion. No obstructing nasopharyngeal lesion is present.   CTA Head and Neck 09/23/2016 The  patient does not have any large or medium vessel stenosis or irregularity.  No vascular occlusion is identified.  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  TTE  - Left ventricle: The cavity size was normal. There was moderate   focal basal and mild concentric hypertrophy. Systolic function   was normal. The estimated ejection fraction was in the range of   55% to 60%. Wall motion was normal; there were no regional wall   motion abnormalities. Doppler parameters are consistent with   abnormal left ventricular relaxation (grade 1 diastolic   dysfunction). - Aortic valve:  Trileaflet; normal thickness, mildly calcified   leaflets. - Mitral valve: There was trivial regurgitation. - Right ventricle: The cavity size was mildly dilated. Wall   thickness was normal. - Right atrium: The atrium was mildly dilated. - Tricuspid valve: There was trivial regurgitation. - Line: A venous catheter was visualized in the superior vena cava,   with its tip in the right atrium. No abnormal features noted.   PHYSICAL EXAM HEENT-  Normocephalic, no lesions, without obvious abnormality.  Normal external eye and conjunctiva.  Normal TM's bilaterally.  Normal auditory canals and external ears. Normal external nose, mucus membranes and septum.  Normal pharynx. Cardiovascular- S1, S2 normal, pulses palpable throughout   Lungs- chest clear, no wheezing, rales, normal symmetric air entry Abdomen- normal findings: bowel sounds normal Extremities- no edema Lymph-no adenopathy palpable Musculoskeletal-no joint tenderness, deformity or swelling Skin-warm and dry, no hyperpigmentation, vitiligo, or suspicious lesions  Neurological Examination Mental Status: Alert, oriented, thought content appropriate.  Speech fluent without evidence of aphasia.  Able to follow 3 step commands without difficulty. Cranial Nerves: II:  Visual fields grossly normal, pupils equal, round, reactive to light and accommodation III,IV, VI: ptosis not present, extra-ocular motions intact bilaterally V,VII: smile symmetric, facial light touch sensation decreased on the right VIII: hearing normal bilaterally IX,X: uvula rises symmetrically XI: bilateral shoulder shrug XII: midline tongue extension Motor: Right :  Upper extremity   5/5                                      Left:     Upper extremity   5/5             Lower extremity   5/5                                                  Lower extremity   5/5 Tone and bulk:normal tone throughout; no atrophy noted Sensory: Pinprick and light touch intact  throughout, bilaterally Deep Tendon Reflexes: 2+ and symmetric throughout Plantars: Right: downgoing                                Left: downgoing Cerebellar: normal finger-to-nose,  and normal heel-to-shin test Gait: Not tested   ASSESSMENT/PLAN [name] is a 65 y.o. male with history of previous strokes, hypertension, hyperglycemia, Barrett's esophagus, vision abnormalities, and chronic back pain presenting with right facial numbness and tingling and mild dysarthria. He did not receive IV t-PA due to mild deficits.  Recrudescence of old left BG/CR infarct - less likely TIA - pt admitted recent stress at home  Resultant  Right facial tingling  MRI - no acute abnormality. Remote left BG/CR infarcts.  CTA H&N - no LVO, but small vessel disease.  2D Echo - EF 55-60%. No cardiac source of emboli identified.  LDL - 86  HgbA1c - pending  VTE prophylaxis - Lovenox  Diet Heart Room service appropriate? Yes; Fluid consistency: Thin  aspirin 325 mg daily prior to admission, now on aspirin 325 mg daily.   Patient counseled to be compliant with his antithrombotic medications  Ongoing aggressive stroke risk factor management  Therapy recommendations: No needs identified  Disposition:  Pending  Follow up with Dr. Leonie Man at [facility]  Hypertension  Stable  Permissive hypertension (OK if < 220/120) but gradually normalize in 5-7 days  Long-term BP goal normotensive  Hyperlipidemia  Home meds:  No lipid lowering medications prior to admission.  LDL , 86 goal < 70  Now on Lipitor 20 mg daily  Continue statin at discharge  Other Stroke Risk Factors  Advanced age  Former smoker. The patient quit approximately 13 months ago.  Obesity, Body mass index is 33.71 kg/m., recommend weight loss, diet and exercise as appropriate   Hx stroke/TIA  Family hx stroke (father and 2 sisters)  Other Active Problems  Recent stress at home  Hospital day # 0  Neurology will  sign off. Please call with questions. Pt will follow up with Dr. Leonie Man at [facility] as scheduled. Thanks for the consult.  Rosalin Hawking, MD PhD Stroke Neurology 09/24/2016 12:00 AM  To contact Stroke Continuity provider, please refer to [email or web address] After hours, contact General Neurology

## 2016-09-23 NOTE — Evaluation (Signed)
Physical Therapy Evaluation Patient Details Name: Richard Vaughn MRN: [id number] DOB: 03-Aug-1951 Today's Date: 09/23/2016   History of Present Illness  Richard Vaughn is a 65 y.o. male with PMH of HTN, HPL, Tobacco use, Chronic pain, CVA (2016) presented with right facial numbness.  MRI with remote Basal Ganglia infarct and Remote left-sided white matter infarcts  Clinical Impression  Patient presents at functional baseline.  Discussed high level balance issues he feels he continues to have since stroke last year.  Currently participating in water aerobics several times a week with his wife.  Discussed need to practice balance on land for it to improve.  Reports has handouts from when attended therapy after last stroke and plans to pull them out to practice at home.  No further skilled PT needs.  Will sign off.     Follow Up Recommendations No PT follow up    Equipment Recommendations  None recommended by PT    Recommendations for Other Services       Precautions / Restrictions Precautions Precautions: None      Mobility  Bed Mobility Overal bed mobility: Independent                Transfers Overall transfer level: Independent Equipment used: None                Ambulation/Gait Ambulation/Gait assistance: Independent Ambulation Distance (Feet): 200 Feet Assistive device: None Gait Pattern/deviations: WFL(Within Functional Limits)     General Gait Details: see DGI  Stairs Stairs: Yes Stairs assistance: Modified independent (Device/Increase time) Stair Management: Two rails;Alternating pattern Number of Stairs: 4    Wheelchair Mobility    Modified Rankin (Stroke Patients Only) Modified Rankin (Stroke Patients Only) Pre-Morbid Rankin Score: No symptoms Modified Rankin: No significant disability     Balance Overall balance assessment: Needs assistance                   Tandem Stance - Right Leg: 15     Rhomberg - Eyes Closed: 30    High Level Balance Comments: LOB after holding in tandem about 15 seconds increased time and attempts to attain position before being able to hold Standardized Balance Assessment Standardized Balance Assessment : Dynamic Gait Index   Dynamic Gait Index Level Surface: Normal Change in Gait Speed: Normal Gait with Horizontal Head Turns: Mild Impairment Gait with Vertical Head Turns: Normal Gait and Pivot Turn: Normal Step Over Obstacle: Normal Step Around Obstacles: Normal Steps: Mild Impairment Total Score: 22       Pertinent Vitals/Pain Pain Assessment: 0-10 Pain Score: 5  Pain Location: lower back Pain Descriptors / Indicators: Aching;Sore (chronic) Pain Intervention(s): Repositioned    Home Living Family/patient expects to be discharged to:: Private residence Living Arrangements: Spouse/significant other Available Help at Discharge: Family Type of Home: House Home Access: Stairs to enter Entrance Stairs-Rails: Right Entrance Stairs-Number of Steps: 4 Home Layout: One level Home Equipment: Environmental consultant - 2 wheels      Prior Function Level of Independence: Independent               Hand Dominance   Dominant Hand: Right    Extremity/Trunk Assessment   Upper Extremity Assessment: Overall WFL for tasks assessed           Lower Extremity Assessment: Overall WFL for tasks assessed         Communication   Communication: No difficulties  Cognition Arousal/Alertness: Awake/alert Behavior During Therapy: WFL for tasks assessed/performed Overall Cognitive Status:  Within Functional Limits for tasks assessed                      General Comments      Exercises     Assessment/Plan    PT Assessment Patent does not need any further PT services  PT Problem List            PT Treatment Interventions      PT Goals (Current goals can be found in the Care Plan section)  Acute Rehab PT Goals PT Goal Formulation: All assessment and education  complete, DC therapy    Frequency     Barriers to discharge        Co-evaluation               End of Session Equipment Utilized During Treatment: Gait belt Activity Tolerance: Patient tolerated treatment well Patient left: in bed;with call bell/phone within reach;with family/visitor present      Functional Assessment Tool Used: Clinical Judgement Functional Limitation: Self care Self Care Current Status [id number]): At least 1 percent but less than 20 percent impaired, limited or restricted Self Care Goal Status [id number]): 0 percent impaired, limited or restricted Self Care Discharge Status [phone]): 0 percent impaired, limited or restricted    Time: 0915-0935 PT Time Calculation (min) (ACUTE ONLY): 20 min   Charges:   PT Evaluation $PT Eval Low Complexity: 1 Procedure     PT G Codes:   PT G-Codes **NOT FOR INPATIENT CLASS** Functional Assessment Tool Used: Clinical Judgement Functional Limitation: Self care Self Care Current Status [id number]): At least 1 percent but less than 20 percent impaired, limited or restricted Self Care Goal Status [id number]): 0 percent impaired, limited or restricted Self Care Discharge Status [phone]): 0 percent impaired, limited or restricted    Reginia Naas 09/23/2016, 9:50 AM  Magda Kiel, [place] 09/23/2016

## 2016-09-23 NOTE — Care Management Note (Signed)
Case Management Note  Patient Details  Name: Richard Vaughn MRN: [id number] Date of Birth: 1951-09-25  Subjective/Objective:                    Action/Plan: Patient discharging home with self care. No f/u per PT/OT and no DME needs. No further needs per CM.   Expected Discharge Date:  09/24/16               Expected Discharge Plan:  Home/Self Care  In-House Referral:     Discharge planning Services     Post Acute Care Choice:    Choice offered to:     DME Arranged:    DME Agency:     HH Arranged:    HH Agency:     Status of Service:  Completed, signed off  If discussed at [employer] of Stay Meetings, dates discussed:    Additional Comments:  Pollie Friar, RN 09/23/2016, 3:17 PM

## 2016-09-23 NOTE — Progress Notes (Signed)
SLP Cancellation Note  Patient Details Name: RONDLE SEHGAL MRN: [id number] DOB: [birth year]   Cancelled treatment:       Reason Eval/Treat Not Completed: SLP screened, no needs identified, will sign off   Avyukth Bontempo, Katherene Ponto 09/23/2016, 10:19 AM

## 2016-09-23 NOTE — Discharge Summary (Addendum)
Physician Discharge Summary  Richard Vaughn [id number] DOB: Jul 30, 1951 DOA: 09/22/2016  PCP: Drema Pry, DO  Admit date: 09/22/2016 Discharge date: 09/23/2016   Recommendations for Outpatient Follow-Up:   1. Smoking cessation 2. Patient refused statin-- will continue diet and exercise (sent prescription to pharmacy in case patient changed mind) 3. HgbA1C pending   Discharge Diagnosis:   Active Problems:   TOBACCO ABUSE   HTN (hypertension)   Stroke-like symptom   Paresthesia   Discharge disposition:  Home.  Discharge Condition: Improved.  Diet recommendation: Low sodium, heart healthy  Wound care: None.   History of Present Illness:   Richard Vaughn is a 65 y.o. male with PMH of HTN, HPL, Tobacco use, Chronic pain, CVA (2016) presented with right facial numbness. Patient reports having right facial tingling for 2 days. He woke up today with mild right sided headache associated with right facial numbness, perioral numbness and right arm numbness. He denies focal weaknesses, no change in speech or hearing, no dizziness, no vertigo. But felt to have gait instability today in the morning. He presented for further evaluation to ED. CT showed possible subacute infarct. He is in no distress, no focal weakness, still have some residual right facial numbness. Denies acute chest pains, no SOB, no nausea, vomiting, no abdominal pian.     Hospital Course by Problem:   TIA -needs smoking cessation -on ASA -MRI  Negative CTA done and unrevealing except small vessel changes -refused statin-- will continue with lifestyle changes (sent prescription to pharmacy anyway)    Medical Consultants:    Neuro   Discharge Exam:   Vitals:   09/23/16 0800 09/23/16 1021  BP: (!) 138/98 132/85  Pulse: 67 60  Resp: 16 18  Temp: 98.1 F (36.7 C) 98.1 F (36.7 C)   Vitals:   09/23/16 0400 09/23/16 0600 09/23/16 0800 09/23/16 1021  BP: 120/90 126/85 (!) 138/98 132/85  Pulse:  (!) 52 (!) 58 67 60  Resp: 18 18 16 18   Temp: 98.1 F (36.7 C) 97.7 F (36.5 C) 98.1 F (36.7 C) 98.1 F (36.7 C)  TempSrc: Oral Oral Oral Oral  SpO2: 99% 99% 98% 97%  Weight:      Height:        Gen:  NAD   The results of significant diagnostics from this hospitalization (including imaging, microbiology, ancillary and laboratory) are listed below for reference.     Procedures and Diagnostic Studies:   Ct Angio Head W Or Wo Contrast  Result Date: 09/23/2016 CLINICAL DATA:  Headache and right-sided tingling. Chronic ischemic changes without acute finding by MRI. EXAM: CT ANGIOGRAPHY HEAD AND NECK TECHNIQUE: Multidetector CT imaging of the head and neck was performed using the standard protocol during bolus administration of intravenous contrast. Multiplanar CT image reconstructions and MIPs were obtained to evaluate the vascular anatomy. Carotid stenosis measurements (when applicable) are obtained utilizing NASCET criteria, using the distal internal carotid diameter as the denominator. CONTRAST:  50 cc Isovue 370 COMPARISON:  MRI 09/22/2016 FINDINGS: CTA NECK FINDINGS Aortic arch: Contrast opacification is minimal. There is atherosclerosis of the arch but no aneurysm or dissection. Branching pattern of the brachiocephalic vessels from the arch is normal. Right carotid system: Common carotid artery widely patent to the bifurcation. The carotid bifurcation is normal without soft or hard plaque, stenosis or irregularity. Cervical internal carotid artery is normal. Left carotid system: Common carotid artery is widely patent to the bifurcation. The carotid bifurcation is normal without soft or  hard plaque, stenosis or irregularity. Cervical internal carotid artery is normal. Vertebral arteries: Right vertebral artery is dominant. Vertebral artery origins are probably sufficiently patent. Small calcified plaque at the left vertebral artery origin. Poor contrast opacification. Cervical segments appear  unremarkable. Skeleton: Negative Other neck: No mass or lymphadenopathy. Upper chest: Emphysema.  No focal or acute finding. Review of the MIP images confirms the above findings CTA HEAD FINDINGS Anterior circulation: Both internal carotid arteries are patent through the skullbase and siphon region. Mild atherosclerotic calcification in the carotid siphon. No stenosis. The anterior and middle cerebral vessels are patent without proximal stenosis, aneurysm or vascular malformation. Posterior circulation: Both vertebral arteries are patent with the right being dominant. No basilar stenosis. Superior cerebellar and posterior cerebral arteries appear unremarkable. Venous sinuses: Patent and normal Anatomic variants: None significant Delayed phase: No abnormal enhancement Review of the MIP images confirms the above findings IMPRESSION: The patient does not have any large or medium vessel stenosis or irregularity. No vascular occlusion is identified. 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 Electronically Signed   By: Nelson Chimes M.D.   On: 09/23/2016 08:59   Ct Head Wo Contrast  Result Date: 09/22/2016 CLINICAL DATA:  Right facial numbness, aphasia EXAM: CT HEAD WITHOUT CONTRAST TECHNIQUE: Contiguous axial images were obtained from the base of the skull through the vertex without intravenous contrast. COMPARISON:  08/02/2015 FINDINGS: Brain: No intracranial hemorrhage, mass effect or midline shift. Again noted atrophy and chronic white matter disease. Stable lacunar infarct in left basal ganglia. There is new area of mild decreased attenuation posterior to left basal ganglia measures about 8.5 mm. Evolving or subacute ischemic cannot be excluded. Clinical correlation is necessary. Further correlation with MRI is recommended as clinically warranted. Vascular: Atherosclerotic calcifications of carotid siphon. Skull: No skull fracture is noted. Sinuses/Orbits: Paranasal sinuses and mastoid air cells are  unremarkable. Other: Ventricular size is stable from prior exam. Again noted cavum septum pellucidum variant. IMPRESSION: 1. Again noted atrophy and chronic white matter disease. Stable lacunar infarct in left basal ganglia. There is new area of mild decreased attenuation posterior to left basal ganglia measures about 8.5 mm. Evolving or subacute ischemic cannot be excluded. Clinical correlation is necessary. Further correlation with MRI is recommended as clinically warranted. These results were called by telephone at the time of interpretation on 09/22/2016 at 12:32 pm to Dr. Zenovia Jarred , who verbally acknowledged these results. Electronically Signed   By: Lahoma Crocker M.D.   On: 09/22/2016 12:32   Ct Angio Neck W Or Wo Contrast  Result Date: 09/23/2016 CLINICAL DATA:  Headache and right-sided tingling. Chronic ischemic changes without acute finding by MRI. EXAM: CT ANGIOGRAPHY HEAD AND NECK TECHNIQUE: Multidetector CT imaging of the head and neck was performed using the standard protocol during bolus administration of intravenous contrast. Multiplanar CT image reconstructions and MIPs were obtained to evaluate the vascular anatomy. Carotid stenosis measurements (when applicable) are obtained utilizing NASCET criteria, using the distal internal carotid diameter as the denominator. CONTRAST:  50 cc Isovue 370 COMPARISON:  MRI 09/22/2016 FINDINGS: CTA NECK FINDINGS Aortic arch: Contrast opacification is minimal. There is atherosclerosis of the arch but no aneurysm or dissection. Branching pattern of the brachiocephalic vessels from the arch is normal. Right carotid system: Common carotid artery widely patent to the bifurcation. The carotid bifurcation is normal without soft or hard plaque, stenosis or irregularity. Cervical internal carotid artery is normal. Left carotid system: Common carotid artery is widely patent to the  bifurcation. The carotid bifurcation is normal without soft or hard plaque, stenosis  or irregularity. Cervical internal carotid artery is normal. Vertebral arteries: Right vertebral artery is dominant. Vertebral artery origins are probably sufficiently patent. Small calcified plaque at the left vertebral artery origin. Poor contrast opacification. Cervical segments appear unremarkable. Skeleton: Negative Other neck: No mass or lymphadenopathy. Upper chest: Emphysema.  No focal or acute finding. Review of the MIP images confirms the above findings CTA HEAD FINDINGS Anterior circulation: Both internal carotid arteries are patent through the skullbase and siphon region. Mild atherosclerotic calcification in the carotid siphon. No stenosis. The anterior and middle cerebral vessels are patent without proximal stenosis, aneurysm or vascular malformation. Posterior circulation: Both vertebral arteries are patent with the right being dominant. No basilar stenosis. Superior cerebellar and posterior cerebral arteries appear unremarkable. Venous sinuses: Patent and normal Anatomic variants: None significant Delayed phase: No abnormal enhancement Review of the MIP images confirms the above findings IMPRESSION: The patient does not have any large or medium vessel stenosis or irregularity. No vascular occlusion is identified. 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 Electronically Signed   By: Nelson Chimes M.D.   On: 09/23/2016 08:59   Mr Brain Wo Contrast  Result Date: 09/22/2016 CLINICAL DATA:  Paresthesias. Right-sided facial tingling and numbness for 2 days. Right-sided headache. Previous CVA. EXAM: MRI HEAD WITHOUT CONTRAST TECHNIQUE: Multiplanar, multiecho pulse sequences of the brain and surrounding structures were obtained without intravenous contrast. COMPARISON:  CT of the head from the same day. MRI brain 08/02/2015. FINDINGS: Brain: Expected evolution of the posterior left centrum semi ovale lacunar infarct is noted. Other left-sided white matter infarcts are stable. Diffuse white  matter changes bilaterally are similar the prior study. Wallerian degeneration extends into the brainstem on the left. No acute infarct, hemorrhage, or mass lesion is present. Remote lacunar infarcts of the basal ganglia are stable white matter disease in the brainstem is otherwise stable. Vascular: Flow is present in the major intracranial arteries. Skull and upper cervical spine: The skullbase is normal. Midline sagittal structures are unremarkable. Craniocervical junction is within normal limits. The upper cervical spine is normal. Sinuses/Orbits: The paranasal sinuses and right mastoid air cells are clear. A left mastoid effusion is again noted. There is no obstructing mass. IMPRESSION: 1. Remote left-sided white matter infarcts. 2. No acute abnormality. 3. Remote lacunar infarcts of the basal ganglia are stable. 4. Multiple punctate foci of susceptibility are stable, remote microhemorrhage likely related to extensive small vessel disease. 5. Chronic left mastoid effusion. No obstructing nasopharyngeal lesion is present. Electronically Signed   By: San Morelle M.D.   On: 09/22/2016 19:14     Labs:   Basic Metabolic Panel:  Recent Labs Lab 09/22/16 1116 09/22/16 1127  NA 139 141  K 4.8 4.9  CL 106 104  CO2 26  --   GLUCOSE 108* 107*  BUN 8 9  CREATININE 1.19 1.10  CALCIUM 9.3  --    GFR Estimated Creatinine Clearance: 95.5 mL/min (by C-G formula based on SCr of 1.1 mg/dL). Liver Function Tests:  Recent Labs Lab 09/22/16 1116  AST 20  ALT 24  ALKPHOS 75  BILITOT 0.9  PROT 7.1  ALBUMIN 4.4   No results for input(s): LIPASE, AMYLASE in the last 168 hours. No results for input(s): AMMONIA in the last 168 hours. Coagulation profile  Recent Labs Lab 09/22/16 1116  INR 1.03    CBC:  Recent Labs Lab 09/22/16 1116 09/22/16 1127  WBC  8.0  --   NEUTROABS 5.1  --   HGB 15.4 16.0  HCT 45.0 47.0  MCV 91.3  --   PLT 203  --    Cardiac Enzymes: No results for  input(s): CKTOTAL, CKMB, CKMBINDEX, TROPONINI in the last 168 hours. BNP: Invalid input(s): POCBNP CBG:  Recent Labs Lab 09/22/16 1144  GLUCAP 144*   D-Dimer No results for input(s): DDIMER in the last 72 hours. Hgb A1c No results for input(s): HGBA1C in the last 72 hours. Lipid Profile  Recent Labs  09/23/16 0632  CHOL 130  HDL 31*  LDLCALC 86  TRIG 66  CHOLHDL 4.2   Thyroid function studies No results for input(s): TSH, T4TOTAL, T3FREE, THYROIDAB in the last 72 hours.  Invalid input(s): FREET3 Anemia work up No results for input(s): VITAMINB12, FOLATE, FERRITIN, TIBC, IRON, RETICCTPCT in the last 72 hours. Microbiology No results found for this or any previous visit (from the past 240 hour(s)).   Discharge Instructions:   Discharge Instructions    Diet - low sodium heart healthy    Complete by:  As directed    Discharge instructions    Complete by:  As directed    Stop smoking   Increase activity slowly    Complete by:  As directed        Medication List    TAKE these medications   aspirin 325 MG tablet Take 1 tablet (325 mg total) by mouth daily.   atorvastatin 20 MG tablet Commonly known as:  LIPITOR Take 1 tablet (20 mg total) by mouth daily at 6 PM.   esomeprazole 40 MG capsule Commonly known as:  NEXIUM Take 1 capsule (40 mg total) by mouth daily at 12 noon.   losartan 25 MG tablet Commonly known as:  COZAAR TAKE 1 TABLET BY MOUTH EVERY DAY   nicotine 14 mg/24hr patch Commonly known as:  NICODERM CQ - dosed in mg/24 hours Place 1 patch (14 mg total) onto the skin daily. Start taking on:  09/24/2016   OPANA 10 MG tablet Generic drug:  oxymorphone Take 10 mg by mouth every 6 (six) hours as needed for pain.   sildenafil 100 MG tablet Commonly known as:  VIAGRA Take one half to one tablet one hour prior to sexual activity.   traZODone 50 MG tablet Commonly known as:  DESYREL Take 1 tablet (50 mg total) by mouth at bedtime.       Follow-up Information    Drema Pry, DO Follow up in 1 week(s).   Specialty:  Internal Medicine Contact information: [place] [address] [phone]            Time coordinating discharge: 35 min  Signed:  Destaney Sarkis Alison Stalling   [facility] 09/23/2016, 1:42 PM

## 2016-09-23 NOTE — Progress Notes (Signed)
  Echocardiogram 2D Echocardiogram has been performed.  Jennette Dubin 09/23/2016, 12:28 PM

## 2016-09-23 NOTE — Progress Notes (Signed)
OT Cancellation Note  Patient Details Name: MARCY ANCHETA MRN: [id number] DOB: 09-Nov-1951   Cancelled Treatment:    Reason Eval/Treat Not Completed: OT screened, no needs identified, will sign off. Per conversation with PT and pt, pt is at baseline with ADLs. No acute OT needs identified. Will sign off.  Tyrone Schimke OTR/L Pager: [phone]  09/23/2016, 9:56 AM

## 2016-09-24 LAB — HEMOGLOBIN A1C
Hgb A1c MFr Bld: 5.4 % (ref 4.8–5.6)
Mean Plasma Glucose: 108 mg/dL

## 2016-10-05 ENCOUNTER — Ambulatory Visit (INDEPENDENT_AMBULATORY_CARE_PROVIDER_SITE_OTHER): Payer: Federal, State, Local not specified - PPO | Admitting: Neurology

## 2016-10-05 DIAGNOSIS — G4733 Obstructive sleep apnea (adult) (pediatric): Secondary | ICD-10-CM | POA: Diagnosis not present

## 2016-10-15 ENCOUNTER — Other Ambulatory Visit: Payer: Self-pay | Admitting: Family Medicine

## 2016-10-17 ENCOUNTER — Telehealth: Payer: Self-pay | Admitting: Neurology

## 2016-10-17 DIAGNOSIS — F1190 Opioid use, unspecified, uncomplicated: Secondary | ICD-10-CM

## 2016-10-17 DIAGNOSIS — I69398 Other sequelae of cerebral infarction: Principal | ICD-10-CM

## 2016-10-17 DIAGNOSIS — G4737 Central sleep apnea in conditions classified elsewhere: Secondary | ICD-10-CM

## 2016-10-17 NOTE — Procedures (Signed)
PATIENT'S NAME:  Ocean, Steensma DOB:      1951-01-15      MR#:    [id number]     DATE OF RECORDING: 10/05/2016 REFERRING M.D.:  Doe-Hyun Shawna Orleans, DO/ Antony Contras, MD Study Performed:   Baseline Polysomnogram HISTORY:  Mr. Mariea Clonts is a 65 year old African-American gentleman , who suffered a stroke on 08-02-15 with mild right dominant weakness, clumsiness, remaining is some hesitation of speech. His wife noted him to snore and have apneas. He is trying to stop smoking.  He is since the stroke sleepier than usual. His motor impairment has recovered very well.   GERD, stricture and stenosis of esophagus, gastritis, depression, gastroparesis, hyperglycemia, renal cyst, nephrolithiasis, back pain, headache, and stroke. The patient endorsed the Epworth Sleepiness Scale at 4/24 points.   The patient's weight 274 pounds with a height of 74 (inches), resulting in a BMI of 35.1 kg/m2. The patient's neck circumference measured 18 inches.  CURRENT MEDICATIONS: Aspirin, Coenzyme, Losartan, Nexium, Ondansetron, Oxymorphone and Trazodone   PROCEDURE:  This is a multichannel digital polysomnogram utilizing the Somnostar 11.2 system.  Electrodes and sensors were applied and monitored per AASM Specifications.   EEG, EOG, Chin and Limb EMG, were sampled at 200 Hz.  ECG, Snore and Nasal Pressure, Thermal Airflow, Respiratory Effort, CPAP Flow and Pressure, Oximetry was sampled at 50 Hz. Digital video and audio were recorded.      BASELINE STUDY  Lights Out was at 22:45 and Lights On at 05:00.  Total recording time (TRT) was 376 minutes, with a total sleep time (TST) of 195.5 minutes.   The patient's sleep latency was 66.5 minutes.  REM latency was 89 minutes.  The sleep efficiency was poor at only 52.0 %.     SLEEP ARCHITECTURE: WASO (Wake after sleep onset) was 113 minutes.  There were 10 minutes in Stage N1, 165.5 minutes Stage N2, 0 minutes Stage N3 and 20 minutes in Stage REM.  The percentage of Stage N1 was 5.1%, Stage  N2 was 84.7%, Stage N3 was 0% and Stage R (REM sleep) was 10.2%.   RESPIRATORY ANALYSIS:  There were a total of 61 respiratory events:  23 obstructive apneas, 31 central apneas and 7 hypopneas with 0 respiratory event related arousals (RERAs).      The total APNEA/HYPOPNEA INDEX (AHI) was 18.7/hour and the total RESPIRATORY DISTURBANCE INDEX was 18.7 /hour.  REM sleep void, the REM AHI was 0 /hour, versus a non-REM AHI of 20.9. The patient spent 36 minutes of total sleep time in the supine position and 160 minutes in non-supine.. The supine AHI was 86.7 versus a non-supine AHI of 3.4.  OXYGEN SATURATION & C02:  The Wake baseline 02 saturation was 92%, with the lowest being 88%. Time spent below 89% saturation equaled 1 minute.   PERIODIC LIMB MOVEMENTS:   The Periodic Limb Movement (PLM) index was 15.0/hr. and the PLM Arousal index was 0.0/hr.  The patient took one bathroom break. Snoring was noted. EKG was in keeping with normal sinus rhythm (NSR).   IMPRESSION: Patient had trouble to fall asleep, was in pain and had his spouse bring his pain medication from home. After taking the medication, he was able to sleep.   Complex sleep apnea, primary central sleep apnea, possibly induced by narcotic pain medication.  AHI was 18.7, but accentuated in supine sleep to over 80/hr.   RECOMMENDATIONS: Advise full-night, attended, CPAP titration study to optimize therapy.  Central apnea should not be auto titrated. Avoid supine  sleep.  1. Note that patients with congestive heart failure (CHF), significant lung disease such as chronic obstructive pulmonary disease (COPD), patients expected to have nocturnal arterial oxyhemoglobin desaturation due to conditions other than OSA (e.g. obesity hypoventilation syndrome), patients who do not snore (either naturally or as a result of palate surgery), and patients who have central sleep apnea syndromes are not currently candidates for APAP titration or treatment.    2. Avoid sedative-hypnotics which may worsen sleep apnea, alcohol and tobacco (as applicable). 3. Advise to lose weight, diet and exercise if not contraindicated (BMI 35) 4. Advise patient to avoid driving or operating hazardous machinery when sleepy. 5. Further information regarding sleep disorders may be obtained from [employer] (www.sleepfoundation.org) or American Sleep Apnea Association (www.sleepapnea.org). 6.  Reduce narcotic dose or discontinue narcotic if possible. 7.  Avoid caffeine-containing beverages and chocolate. 8. A follow up appointment will be scheduled in the Sleep Clinic at [facility]. The referring provider will be notified of the results.      I certify that I have reviewed the entire raw data recording prior to the issuance of this report in accordance with the Standards of Accreditation of the American Academy of Sleep Medicine (AASM)      Larey Seat, MD  10-17-2016 Diplomat, American Board of Psychiatry and Neurology  Diplomat, American Board of [place] Director, [facility] [employer]

## 2016-10-18 NOTE — Telephone Encounter (Signed)
-----   Message from Larey Seat, MD sent at 10/17/2016  2:52 PM EST ----- Complex sleep apnea, primary central sleep apnea, possibly induced by narcotic pain medication.  AHI was 18.7, but accentuated in supine sleep to over 80/hr.   RECOMMENDATIONS: Advise full-night, attended, CPAP titration study to optimize therapy.  Central apnea should not be auto titrated. Avoid supine sleep.

## 2016-10-18 NOTE — Telephone Encounter (Signed)
I called pt to discuss pt's sleep study results. No answer, left a message asking him to call me back.

## 2016-10-20 NOTE — Telephone Encounter (Signed)
I called pt. I advised him that his sleep study revealed complex sleep apnea, primarily central sleep apnea, possibly induced by narcotic pain medication. Dr. Brett Fairy recommends that pt return for a full-night attended, CPAP titration study to optimize therapy. I advised pt to avoid supine sleep. Pt is agreeable to coming in for an attended cpap titration study. He says that he will consider reducing his pain medication and discuss this with his doctor. Pt was advised that our sleep lab will call him to schedule his cpap titration study.Pt verbalized understanding of results. Pt had no questions at this time but was encouraged to call back if questions arise.

## 2016-10-20 NOTE — Telephone Encounter (Signed)
Patient is returning your call.  

## 2016-12-15 ENCOUNTER — Encounter: Payer: Self-pay | Admitting: Family Medicine

## 2016-12-15 ENCOUNTER — Ambulatory Visit (INDEPENDENT_AMBULATORY_CARE_PROVIDER_SITE_OTHER): Payer: Federal, State, Local not specified - PPO | Admitting: Family Medicine

## 2016-12-15 ENCOUNTER — Telehealth: Payer: Self-pay | Admitting: Internal Medicine

## 2016-12-15 VITALS — BP 90/60 | HR 97 | Temp 99.3°F | Ht 74.0 in | Wt 272.0 lb

## 2016-12-15 DIAGNOSIS — R69 Illness, unspecified: Secondary | ICD-10-CM

## 2016-12-15 DIAGNOSIS — J111 Influenza due to unidentified influenza virus with other respiratory manifestations: Secondary | ICD-10-CM

## 2016-12-15 DIAGNOSIS — R509 Fever, unspecified: Secondary | ICD-10-CM | POA: Diagnosis not present

## 2016-12-15 LAB — POCT INFLUENZA A/B

## 2016-12-15 MED ORDER — ONDANSETRON 8 MG PO TBDP: 8.0000 mg | ORAL_TABLET | Freq: Three times a day (TID) | ORAL | 0 refills | Status: DC | PRN

## 2016-12-15 MED ORDER — OSELTAMIVIR PHOSPHATE 75 MG PO CAPS: 75.0000 mg | ORAL_CAPSULE | Freq: Two times a day (BID) | ORAL | 0 refills | Status: DC

## 2016-12-15 NOTE — Progress Notes (Signed)
Pre visit review using our clinic review tool, if applicable. No additional management support is needed unless otherwise documented below in the visit note. 

## 2016-12-15 NOTE — Progress Notes (Signed)
Subjective:     Patient ID: Richard Vaughn, male   DOB: 01/19/1951, 66 y.o.   MRN: [id number]  HPI   Seen for acute illness. Onset fairly sudden last night of headache, myalgias, fever, nausea without vomiting, and chills. Minimal sore throat. Occasional cough. No diarrhea. No known sick contacts. Keeping down fluids but very poor appetite. No abdominal pain. No dysuria.  Past Medical History:  Diagnosis Date  . Barrett's esophagus   . Chronic back pain   . Gastritis    Mild  . Gastroparesis   . GERD (gastroesophageal reflux disease)   . Headache   . Hyperglycemia   . Nephrolithiasis   . Renal cyst   . Stricture and stenosis of esophagus   . Stroke ([place]) 07-2015  . Vision abnormalities    Past Surgical History:  Procedure Laterality Date  . [place] SURGERY  2012  . COLONOSCOPY    . [place] SURGERY  2005, 2010   replaced L4 and L5  . UPPER GASTROINTESTINAL ENDOSCOPY      reports that he has been smoking Cigarettes.  He has a 7.50 pack-year smoking history. He has never used smokeless tobacco. He reports that he uses drugs, including Oxycodone and Morphine. He reports that he does not drink alcohol. family history includes Hypertension in his brother, father, mother, and sister; Liver cancer in his mother; Stroke in his father, sister, and sister. No Known Allergies   Review of Systems  Constitutional: Positive for appetite change, chills, fatigue and fever.  HENT: Positive for sore throat.   Respiratory: Positive for cough.   Gastrointestinal: Negative for abdominal pain.  Neurological: Positive for headaches.       Objective:   Physical Exam  Constitutional: He appears well-developed and well-nourished.  HENT:  Right Ear: External ear normal.  Left Ear: External ear normal.  Mouth/Throat: Oropharynx is clear and moist. No oropharyngeal exudate.  Neck: Neck supple.  Cardiovascular: Normal rate and regular rhythm.   Pulmonary/Chest: Effort normal and  breath sounds normal. No respiratory distress. He has no wheezes. He has no rales.  Lymphadenopathy:    He has no cervical adenopathy.       Assessment:     Febrile illness. Suspect influenza. Rapid was negative but he has classic signs and symptoms. Patient is nontoxic in appearance    Plan:     - Tamiflu 75 mg twice a day for 5 days -Zofran 8 mg every 8 hours as needed for nausea -Increase fluid intake -Follow-up promptly for any worsening symptoms  Richard Post MD [facility]

## 2016-12-15 NOTE — Telephone Encounter (Signed)
Pt has see Dr. Elease Hashimoto in the absence of Dr. Shawna Orleans and would like to see if Dr. Elease Hashimoto would accept him?  Pt is aware that Dr. Elease Hashimoto if not accepting new pt but he really would like to have him as his PCP.

## 2016-12-15 NOTE — Patient Instructions (Signed)

## 2016-12-16 NOTE — Telephone Encounter (Signed)
I really appreciate seeing him but feel we need to offer another provider b/o of my large panel.

## 2016-12-22 NOTE — Telephone Encounter (Signed)
Called pt to let him know of the below message and he will call back with his choice.

## 2016-12-27 ENCOUNTER — Telehealth: Payer: Self-pay | Admitting: Gastroenterology

## 2016-12-28 ENCOUNTER — Other Ambulatory Visit: Payer: Self-pay

## 2016-12-28 MED ORDER — ESOMEPRAZOLE MAGNESIUM 40 MG PO CPDR: 40.0000 mg | DELAYED_RELEASE_CAPSULE | Freq: Every day | ORAL | 3 refills | Status: DC

## 2016-12-28 NOTE — Telephone Encounter (Signed)
Yes you can refill nexium 40mg  once daily. #90 RF#3. Diagnosis is Barrett's esophagus. Thanks

## 2016-12-28 NOTE — Telephone Encounter (Signed)
rx sent

## 2016-12-29 ENCOUNTER — Other Ambulatory Visit: Payer: Self-pay

## 2016-12-29 MED ORDER — ESOMEPRAZOLE MAGNESIUM 40 MG PO CPDR: 40.0000 mg | DELAYED_RELEASE_CAPSULE | Freq: Every day | ORAL | 3 refills | Status: DC

## 2016-12-29 NOTE — Telephone Encounter (Signed)
Pt states that he needs the nexium sent to [facility] of [facility] [address]

## 2016-12-29 NOTE — Telephone Encounter (Signed)
Nexium sent to [facility]

## 2016-12-29 NOTE — Telephone Encounter (Signed)
Patient states that the generic version of nexium was called in to the pharmacy. Patient is requesting name brand nexium be called in. [facility]. Patient is requesting a callback regarding this med. Best # [phone]

## 2016-12-29 NOTE — Telephone Encounter (Signed)
Sent Rx again to [facility]. In instructions portion instructed that patient requested name brand instead of generic. Called to inform pt and he is not sure if they will except Rx. He states that he will call them and call me back if there is a problem.

## 2017-01-03 ENCOUNTER — Other Ambulatory Visit: Payer: Self-pay

## 2017-01-03 MED ORDER — ESOMEPRAZOLE MAGNESIUM 40 MG PO CPDR: 40.0000 mg | DELAYED_RELEASE_CAPSULE | Freq: Every day | ORAL | 3 refills | Status: DC

## 2017-01-03 NOTE — Telephone Encounter (Signed)
Sent refill again to [facility]. Made it clear that pt needs brandname Nexium. As this works best for pt.

## 2017-01-07 ENCOUNTER — Other Ambulatory Visit: Payer: Self-pay | Admitting: Family Medicine

## 2017-01-11 DIAGNOSIS — N5201 Erectile dysfunction due to arterial insufficiency: Secondary | ICD-10-CM | POA: Diagnosis not present

## 2017-01-11 DIAGNOSIS — Z125 Encounter for screening for malignant neoplasm of prostate: Secondary | ICD-10-CM | POA: Diagnosis not present

## 2017-02-08 DIAGNOSIS — N401 Enlarged prostate with lower urinary tract symptoms: Secondary | ICD-10-CM | POA: Diagnosis not present

## 2017-02-08 DIAGNOSIS — N5201 Erectile dysfunction due to arterial insufficiency: Secondary | ICD-10-CM | POA: Diagnosis not present

## 2017-02-08 DIAGNOSIS — R350 Frequency of micturition: Secondary | ICD-10-CM | POA: Diagnosis not present

## 2017-02-08 DIAGNOSIS — R351 Nocturia: Secondary | ICD-10-CM | POA: Diagnosis not present

## 2017-02-18 ENCOUNTER — Telehealth: Payer: Self-pay | Admitting: Neurology

## 2017-02-18 NOTE — Telephone Encounter (Signed)
We have attempted to call the patient 3 times to schedule sleep study. Patient has been unavailable at the phone numbers we have on file and has not returned our calls. At this point we will send a letter asking pt to please contact the sleep lab to schedule their sleep study. If patient calls back we will schedule them for their sleep study. ° °

## 2017-02-22 ENCOUNTER — Telehealth: Payer: Self-pay | Admitting: Neurology

## 2017-02-22 ENCOUNTER — Ambulatory Visit: Payer: Federal, State, Local not specified - PPO | Admitting: Neurology

## 2017-02-23 ENCOUNTER — Encounter: Payer: Self-pay | Admitting: Neurology

## 2017-03-01 ENCOUNTER — Other Ambulatory Visit: Payer: Self-pay | Admitting: Family Medicine

## 2017-03-02 NOTE — Telephone Encounter (Signed)
error 

## 2017-06-09 DIAGNOSIS — F3289 Other specified depressive episodes: Secondary | ICD-10-CM | POA: Diagnosis not present

## 2017-06-10 DIAGNOSIS — F3289 Other specified depressive episodes: Secondary | ICD-10-CM | POA: Diagnosis not present

## 2017-06-12 ENCOUNTER — Other Ambulatory Visit: Payer: Self-pay | Admitting: Family Medicine

## 2017-06-30 DIAGNOSIS — F3289 Other specified depressive episodes: Secondary | ICD-10-CM | POA: Diagnosis not present

## 2017-07-01 DIAGNOSIS — F3289 Other specified depressive episodes: Secondary | ICD-10-CM | POA: Diagnosis not present

## 2017-07-12 NOTE — Progress Notes (Signed)
HPI:   Richard Vaughn is a 66 y.o. male, who is here today to establish care.  Former PCP: Shawna Orleans Last preventive routine visit: 2016.  Chronic medical problems: GERD with Barrett's esophagus, CVA, HTN, chronic pain,ED,insomnia.  He follows with neuro, Dr Brett Fairy.  Chronic lower back pain with radiation to LE's, he follows with pain clinic every 3 months. Denies numbness,saddle anesthesia,or urine/bowel incontinence.  ED on Viagra 100 mg daily as needed. Med still helping,denies side effects.  Hypertension:  Dx a few years ago. Currently on Cozaar 25 mg daily.  BP readings at home: 120-130/80-90's. Last eye exam: 05/2016, beginning of cataracts.  He is taking medications as instructed, no side effects reported.  He has not noted unusual headache, visual changes, exertional chest pain, dyspnea,  focal weakness, or edema.   Lab Results  Component Value Date   CREATININE 1.10 09/22/2016   BUN 9 09/22/2016   NA 141 09/22/2016   K 4.9 09/22/2016   CL 104 09/22/2016   CO2 26 09/22/2016    Hyperlipidemia:  He was on Lipitor 20 mg daily but he "did not like it." Following a low fat diet: Yes.. He didn't have side effects while he was on Lipitor.  Lab Results  Component Value Date   CHOL 130 09/23/2016   HDL 31 (L) 09/23/2016   LDLCALC 86 09/23/2016   TRIG 66 09/23/2016   CHOLHDL 4.2 09/23/2016    Insomnia: Currently he is on Trazodone 50 mg at bedtime, medication is still helping, he denies side effects. He sleeps about 5-6 hours.  Concerns today: None    Review of Systems  Constitutional: Negative for activity change, appetite change, fatigue and fever.  HENT: Negative for mouth sores, nosebleeds, sore throat and trouble swallowing.   Eyes: Negative for redness and visual disturbance.  Respiratory: Negative for cough, shortness of breath and wheezing.   Cardiovascular: Negative for chest pain, palpitations and leg swelling.  Gastrointestinal:  Negative for abdominal pain, nausea and vomiting.       No changes in bowel habits  Endocrine: Negative for cold intolerance and heat intolerance.  Genitourinary: Negative for decreased urine volume, dysuria and hematuria.  Musculoskeletal: Positive for back pain. Negative for gait problem.  Skin: Negative for rash.  Neurological: Negative for syncope, weakness, numbness and headaches.  Psychiatric/Behavioral: Positive for sleep disturbance. Negative for confusion. The patient is nervous/anxious.       Current Outpatient Prescriptions on File Prior to Visit  Medication Sig Dispense Refill  . aspirin 325 MG tablet Take 1 tablet (325 mg total) by mouth daily.    Marland Kitchen esomeprazole (NEXIUM) 40 MG capsule Take 1 capsule (40 mg total) by mouth daily at 12 noon. Please fill brand name Nexium. No substitutions. Brand name has the best results for patient.Thank you. 90 capsule 3  . losartan (COZAAR) 25 MG tablet TAKE 1 TABLET BY MOUTH EVERY DAY 90 tablet 1  . oxymorphone (OPANA) 10 MG tablet Take 10 mg by mouth every 6 (six) hours as needed for pain.     . sildenafil (VIAGRA) 100 MG tablet Take one half to one tablet one hour prior to sexual activity. 6 tablet 6  . traZODone (DESYREL) 50 MG tablet TAKE 1 TABLET (50 MG TOTAL) BY MOUTH AT BEDTIME. 90 tablet 0   No current facility-administered medications on file prior to visit.      Past Medical History:  Diagnosis Date  . Barrett's esophagus   . Chronic back  pain   . Gastritis    Mild  . Gastroparesis   . GERD (gastroesophageal reflux disease)   . Headache   . Hyperglycemia   . Nephrolithiasis   . Renal cyst   . Stricture and stenosis of esophagus   . Stroke ([place]) 07-2015  . Vision abnormalities    No Known Allergies  Family History  Problem Relation Age of Onset  . Hypertension Father   . Stroke Father   . Hypertension Mother   . Liver cancer Mother   . Hypertension Sister        2 other sisters has also  . Stroke Sister   .  Stroke Sister   . Hypertension Brother        2nd brother has also  . Colon cancer Neg Hx   . Esophageal cancer Neg Hx   . Rectal cancer Neg Hx   . Stomach cancer Neg Hx   . Colon polyps Neg Hx     Social History   Social History  . Marital status: Married    Spouse name: N/A  . Number of children: 3  . Years of education: N/A   Occupational History  . retired [employer]   Social History Main Topics  . Smoking status: Current Some Day Smoker    Packs/day: 0.25    Years: 30.00    Types: Cigarettes    Last attempt to quit: 08/02/2015  . Smokeless tobacco: Never Used     Comment: Counseling sheet given 3/15  . Alcohol use No  . Drug use: Yes    Types: Oxycodone, Morphine     Comment: Opana daily for chronic  back pain  . Sexual activity: Yes   Other Topics Concern  . None   Social History Narrative  . None    Vitals:   07/13/17 0804  BP: 124/80  Pulse: 64  Resp: 12  SpO2: 96%    Body mass index is 34.68 kg/m.   Physical Exam  Nursing note and vitals reviewed. Constitutional: He is oriented to person, place, and time. He appears well-developed. No distress.  HENT:  Head: Normocephalic and atraumatic.  Mouth/Throat: Oropharynx is clear and moist and mucous membranes are normal.  Eyes: Pupils are equal, round, and reactive to light. Conjunctivae are normal.  Cardiovascular: Normal rate and regular rhythm.   No murmur heard. Pulses:      Dorsalis pedis pulses are 2+ on the right side, and 2+ on the left side.  Respiratory: Effort normal and breath sounds normal. No respiratory distress.  GI: Soft. He exhibits no mass. There is no hepatomegaly. There is no tenderness.  Musculoskeletal: He exhibits edema (trace pitting LE edema,bilateral.).  Lymphadenopathy:    He has no cervical adenopathy.  Neurological: He is alert and oriented to person, place, and time. Gait normal.  I do not appreciate focal deficit. Mild dysarthria.  Skin: Skin is warm. No  rash noted. No erythema.  Periocular xanthelasma, bilateral.  Psychiatric: His mood appears anxious.  Well groomed, good eye contact.    ASSESSMENT AND PLAN:   Mr. [name] was seen today for establish care.  Diagnoses and all orders for this visit:  Essential hypertension  Adequately controlled. No changes in current management. DASH-low salt diet to continue. Eye exam recommended at least annually. F/U in 6 months, before if needed.  Insomnia, unspecified type  Stable. No changes in current management. Good sleep hygiene. F/U in 6-12 months.  PAD (peripheral artery disease) ([place])  Continue Aspirin 325 mg daily. He does not want to take statin for now.  S/P left basal ganglia lacunar infarct. He is currently following with Dr Geni Bers.  Carotid Duplex in 07/2015: - The vertebral arteries appear patent with antegrade flow. - Findings consistent with 1-39 percent stenosis involving the right internal carotid artery and the left internal carotid artery.   Hyperlipidemia with target LDL less than 70  He would like to continue non pharmacologic treatment. Benefits of statins as well as some side effects discussed. Will plan on checking FLP next OV.   Today he cannot have labs done, he needs to be somewhere and cannot wait. We agree on having labs next office visit, October 2018. He states that he does not need refills.    Mylo Driskill G. [place], MD  [facility]. [place] office.

## 2017-07-13 ENCOUNTER — Encounter: Payer: Self-pay | Admitting: Family Medicine

## 2017-07-13 ENCOUNTER — Ambulatory Visit (INDEPENDENT_AMBULATORY_CARE_PROVIDER_SITE_OTHER): Payer: Federal, State, Local not specified - PPO | Admitting: Family Medicine

## 2017-07-13 VITALS — BP 124/80 | HR 64 | Resp 12 | Ht 74.0 in | Wt 270.1 lb

## 2017-07-13 DIAGNOSIS — I10 Essential (primary) hypertension: Secondary | ICD-10-CM | POA: Diagnosis not present

## 2017-07-13 DIAGNOSIS — E785 Hyperlipidemia, unspecified: Secondary | ICD-10-CM | POA: Insufficient documentation

## 2017-07-13 DIAGNOSIS — G4700 Insomnia, unspecified: Secondary | ICD-10-CM | POA: Insufficient documentation

## 2017-07-13 DIAGNOSIS — I739 Peripheral vascular disease, unspecified: Secondary | ICD-10-CM | POA: Insufficient documentation

## 2017-08-04 ENCOUNTER — Encounter: Payer: Self-pay | Admitting: Family Medicine

## 2017-08-14 NOTE — Progress Notes (Signed)
HPI:  [name] is a 66 y.o.male here today for his routine physical examination.  Last CPE: 2016  He lives with his wife.   Independent ADL's and IADL's. No falls in the past year and denies depression symptoms.  Regular exercise 3 or more times per week: 5 times per week aquatic exercise. Following a healthy diet: Yes.   Chronic medical problems: Chronic lower back pain,HTN,ED,GERD,CVA, PAD,insomnia and HLD among others. Sleeps between 5-6 hours.  He is not on statin medication, denies side effects when he was on Lipitor. He follows with pain clinic every 3 months for chronic lower back pain.   Hx of STD's: Denies  Immunization History  Administered Date(s) Administered  . Tdap 12/27/2014    -Hep C screening: Not done in the past.  Last eye exam 05/2017.  Last colon cancer screening: Colonoscopy in 2016. Last prostate ca screening: This with his urologists. Reported as normal, he follows once per year.  Lab Results  Component Value Date   PSA 0.35 03/20/2015   PSA 0.29 08/01/2013   PSA 0.42 04/21/2009   Nocturia about 1-2 per night. Denies dysuria,increased urinary frequency, gross hematuria,or decreased urine output.  -Denies high alcohol intake or Hx of illicit drug use. + Smoker: 1/2 PPD. He has smoke for about 20+ years, max 1 PPD in the past.    Review of Systems  Constitutional: Negative for activity change, appetite change, fatigue and fever.  HENT: Negative for dental problem, hearing loss, mouth sores, nosebleeds, sore throat, trouble swallowing and voice change.   Eyes: Negative for redness and visual disturbance.  Respiratory: Negative for cough, shortness of breath and wheezing.   Cardiovascular: Negative for chest pain, palpitations and leg swelling.  Gastrointestinal: Negative for abdominal pain, blood in stool, nausea and vomiting.  Endocrine: Negative for cold intolerance, heat intolerance, polydipsia, polyphagia and  polyuria.  Genitourinary: Negative for decreased urine volume, dysuria, genital sores, hematuria and testicular pain.  Musculoskeletal: Positive for back pain. Negative for gait problem and myalgias.  Skin: Negative for color change and rash.  Allergic/Immunologic: Negative for environmental allergies.  Neurological: Negative for syncope, weakness and headaches.  Hematological: Negative for adenopathy. Does not bruise/bleed easily.  Psychiatric/Behavioral: Positive for sleep disturbance. Negative for confusion. The patient is nervous/anxious.   All other systems reviewed and are negative.    Current Outpatient Prescriptions on File Prior to Visit  Medication Sig Dispense Refill  . aspirin 325 MG tablet Take 1 tablet (325 mg total) by mouth daily.    Marland Kitchen oxymorphone (OPANA) 10 MG tablet Take 10 mg by mouth every 6 (six) hours as needed for pain.     . sildenafil (VIAGRA) 100 MG tablet Take one half to one tablet one hour prior to sexual activity. 6 tablet 6   No current facility-administered medications on file prior to visit.      Past Medical History:  Diagnosis Date  . Barrett's esophagus   . Chronic back pain   . Gastritis    Mild  . Gastroparesis   . GERD (gastroesophageal reflux disease)   . Headache   . Hyperglycemia   . Nephrolithiasis   . Renal cyst   . Stricture and stenosis of esophagus   . Stroke ([place]) 07-2015  . Vision abnormalities     Past Surgical History:  Procedure Laterality Date  . [place] SURGERY  2012  . COLONOSCOPY    . [place] SURGERY  2005, 2010  replaced L4 and L5  . UPPER GASTROINTESTINAL ENDOSCOPY      No Known Allergies  Family History  Problem Relation Age of Onset  . Hypertension Father   . Stroke Father   . Hypertension Mother   . Liver cancer Mother   . Hypertension Sister        2 other sisters has also  . Stroke Sister   . Stroke Sister   . Hypertension Brother        2nd brother has also  . Colon cancer Neg Hx     . Esophageal cancer Neg Hx   . Rectal cancer Neg Hx   . Stomach cancer Neg Hx   . Colon polyps Neg Hx     Social History   Social History  . Marital status: Married    Spouse name: N/A  . Number of children: 3  . Years of education: N/A   Occupational History  . retired [employer]   Social History Main Topics  . Smoking status: Current Some Day Smoker    Packs/day: 0.25    Years: 30.00    Types: Cigarettes    Last attempt to quit: 08/02/2015  . Smokeless tobacco: Never Used     Comment: Counseling sheet given 3/15  . Alcohol use No  . Drug use: Yes    Types: Oxycodone, Morphine     Comment: Opana daily for chronic  back pain  . Sexual activity: Yes   Other Topics Concern  . None   Social History Narrative  . None     Vitals:   08/15/17 0809  BP: 118/80  Pulse: 61  Resp: 12  SpO2: 96%   Body mass index is 34.99 kg/m.   Wt Readings from Last 3 Encounters:  08/15/17 272 lb 8 oz (123.6 kg)  07/13/17 270 lb 2 oz (122.5 kg)  12/15/16 272 lb (123.4 kg)    Physical Exam  Nursing note and vitals reviewed. Constitutional: He is oriented to person, place, and time. He appears well-developed. No distress.  HENT:  Head: Normocephalic and atraumatic.  Right Ear: Hearing, tympanic membrane, external ear and ear canal normal.  Left Ear: Hearing, tympanic membrane, external ear and ear canal normal.  Mouth/Throat: Oropharynx is clear and moist and mucous membranes are normal.  Eyes: Pupils are equal, round, and reactive to light. Conjunctivae and EOM are normal.  Neck: Normal range of motion. No tracheal deviation present. No thyromegaly present.  Cardiovascular: Normal rate and regular rhythm.   No murmur heard. Pulses:      Dorsalis pedis pulses are 2+ on the right side, and 2+ on the left side.  Respiratory: Effort normal and breath sounds normal. No respiratory distress.  GI: Soft. He exhibits no mass. There is no tenderness.  Genitourinary:   Genitourinary Comments: Deferred to urologist.  Musculoskeletal: He exhibits edema (Trace piting LE edema, bilateral). He exhibits no tenderness.  No major deformities appreciated and no signs of synovitis.  Lymphadenopathy:    He has no cervical adenopathy.       Right: No supraclavicular adenopathy present.       Left: No supraclavicular adenopathy present.  Neurological: He is alert and oriented to person, place, and time. He has normal strength. No cranial nerve deficit or sensory deficit. Coordination and gait normal.  Skin: Skin is warm. No rash noted. No erythema.  Psychiatric: His mood appears anxious.  Fairly groomed, good eye contact.   ASSESSMENT AND PLAN:  Mr. D'Arcy  was seen today for annual exam.  Diagnoses and all orders for this visit:  Lab Results  Component Value Date   CHOL 139 08/15/2017   HDL 31.10 (L) 08/15/2017   LDLCALC 90 08/15/2017   TRIG 90.0 08/15/2017   CHOLHDL 4 08/15/2017   Lab Results  Component Value Date   HGBA1C 5.5 08/15/2017   Lab Results  Component Value Date   ALT 18 08/15/2017   AST 13 08/15/2017   ALKPHOS 70 08/15/2017   BILITOT 0.5 08/15/2017    Routine general medical examination at a health care facility  We discussed the importance of regular physical activity and healthy diet for prevention of chronic illness and/or complications. Preventive guidelines reviewed. Vaccination refused Flu vaccine. Aspirin 325 mg to continue, some side effects discussed. Smoking cessation encouraged. Next CPE in a year.  Encounter for HCV screening test for high risk patient -     Hepatitis C antibody screen  Hyperlipidemia with target LDL less than 70  Benefits of statin meds discussed, mainly in regard to secondary prevention given his Hx of CVA. Continue low fat diet. Further recommendations will be given according to lab results.  -     Lipid panel -     Comprehensive metabolic panel  Hyperglycemia  Further recommendations  will be given according to HgA1C results.  -     Hemoglobin A1c  Insomnia, unspecified type -     traZODone (DESYREL) 50 MG tablet; Take 1 tablet (50 mg total) by mouth at bedtime.  Gastroesophageal reflux disease without esophagitis -     esomeprazole (NEXIUM) 40 MG capsule; Take 1 capsule (40 mg total) by mouth daily before breakfast. Please fill brand name Nexium. No substitutions. Brand name has the best results for patient.Thank you.  Essential hypertension -     losartan (COZAAR) 25 MG tablet; Take 1 tablet (25 mg total) by mouth daily.  PAD (peripheral artery disease) ([place])  He is not on statin and still smoking, adverse effects of tobacco discussed. Continue Aspirin 325 mg, some side effects discussed.    Return in 6 months (on 02/13/2018) for Insomnia,HTN.    [name]. Martinique, MD  [facility]. [place] office.

## 2017-08-15 ENCOUNTER — Encounter: Payer: Self-pay | Admitting: Family Medicine

## 2017-08-15 ENCOUNTER — Ambulatory Visit (INDEPENDENT_AMBULATORY_CARE_PROVIDER_SITE_OTHER): Payer: Federal, State, Local not specified - PPO | Admitting: Family Medicine

## 2017-08-15 VITALS — BP 118/80 | HR 61 | Resp 12 | Ht 74.0 in | Wt 272.5 lb

## 2017-08-15 DIAGNOSIS — E785 Hyperlipidemia, unspecified: Secondary | ICD-10-CM | POA: Diagnosis not present

## 2017-08-15 DIAGNOSIS — Z0000 Encounter for general adult medical examination without abnormal findings: Secondary | ICD-10-CM | POA: Diagnosis not present

## 2017-08-15 DIAGNOSIS — Z125 Encounter for screening for malignant neoplasm of prostate: Secondary | ICD-10-CM | POA: Diagnosis not present

## 2017-08-15 DIAGNOSIS — K219 Gastro-esophageal reflux disease without esophagitis: Secondary | ICD-10-CM | POA: Diagnosis not present

## 2017-08-15 DIAGNOSIS — R739 Hyperglycemia, unspecified: Secondary | ICD-10-CM | POA: Diagnosis not present

## 2017-08-15 DIAGNOSIS — I10 Essential (primary) hypertension: Secondary | ICD-10-CM | POA: Diagnosis not present

## 2017-08-15 DIAGNOSIS — Z9189 Other specified personal risk factors, not elsewhere classified: Secondary | ICD-10-CM

## 2017-08-15 DIAGNOSIS — I739 Peripheral vascular disease, unspecified: Secondary | ICD-10-CM | POA: Diagnosis not present

## 2017-08-15 DIAGNOSIS — Z1159 Encounter for screening for other viral diseases: Secondary | ICD-10-CM

## 2017-08-15 DIAGNOSIS — G4700 Insomnia, unspecified: Secondary | ICD-10-CM

## 2017-08-15 LAB — COMPREHENSIVE METABOLIC PANEL
ALK PHOS: 70 U/L (ref 39–117)
ALT: 18 U/L (ref 0–53)
AST: 13 U/L (ref 0–37)
Albumin: 4.1 g/dL (ref 3.5–5.2)
BILIRUBIN TOTAL: 0.5 mg/dL (ref 0.2–1.2)
BUN: 11 mg/dL (ref 6–23)
CALCIUM: 9 mg/dL (ref 8.4–10.5)
CO2: 24 mEq/L (ref 19–32)
CREATININE: 1.06 mg/dL (ref 0.40–1.50)
Chloride: 107 mEq/L (ref 96–112)
GFR: 90.52 mL/min (ref >60.00)
Glucose, Bld: 98 mg/dL (ref 70–99)
Potassium: 4.2 mEq/L (ref 3.5–5.1)
Sodium: 140 mEq/L (ref 135–145)
TOTAL PROTEIN: 6.5 g/dL (ref 6.0–8.3)

## 2017-08-15 LAB — LIPID PANEL
CHOLESTEROL: 139 mg/dL (ref 0–200)
HDL: 31.1 mg/dL — AB (ref >39.00)
LDL Cholesterol: 90 mg/dL (ref 0–99)
NonHDL: 107.73
TRIGLYCERIDES: 90 mg/dL (ref 0.0–149.0)
Total CHOL/HDL Ratio: 4
VLDL: 18 mg/dL (ref 0.0–40.0)

## 2017-08-15 LAB — HEMOGLOBIN A1C: Hgb A1c MFr Bld: 5.5 % (ref 4.6–6.5)

## 2017-08-15 MED ORDER — LOSARTAN POTASSIUM 25 MG PO TABS: 25.0000 mg | ORAL_TABLET | Freq: Every day | ORAL | 2 refills | Status: DC

## 2017-08-15 MED ORDER — ESOMEPRAZOLE MAGNESIUM 40 MG PO CPDR: 40.0000 mg | DELAYED_RELEASE_CAPSULE | Freq: Every day | ORAL | 3 refills | Status: DC

## 2017-08-15 MED ORDER — TRAZODONE HCL 50 MG PO TABS: 50.0000 mg | ORAL_TABLET | Freq: Every day | ORAL | 1 refills | Status: DC

## 2017-08-15 NOTE — Patient Instructions (Addendum)
A few things to remember from today's visit:   Routine general medical examination at a health care facility  Prostate cancer screening  Encounter for HCV screening test for high risk patient - Plan: Hepatitis C antibody screen  Hyperlipidemia with target LDL less than 70 - Plan: Lipid panel, Comprehensive metabolic panel  Hyperglycemia - Plan: Hemoglobin A1c     At least 150 minutes of moderate exercise per week, daily brisk walking for 15-30 min is a good exercise option. Healthy diet low in saturated (animal) fats and sweets and consisting of fresh fruits and vegetables, lean meats such as fish and white chicken and whole grains.  - Vaccines:  Tdap vaccine every 10 years.  Shingles vaccine recommended at age 69, could be given after 64 years of age but not sure about insurance coverage.  Pneumonia vaccines:  Prevnar 64 at 45 and Pneumovax at 55.   -Screening recommendations for low/normal risk males:  Screening for diabetes at age 57 and every 3 years. Earlier screening if cardiovascular risk factors.     Colon cancer screening at age 4 and until age 55.  Prostate cancer screening: some controversy, starts usually at 28: Rectal exam and PSA.  Aortic Abdominal Aneurism once between 17 and 3 years old if even smoker.      Please be sure medication list is accurate. If a new problem present, please set up appointment sooner than planned today.

## 2017-08-16 LAB — HEPATITIS C ANTIBODY
Hepatitis C Ab: NONREACTIVE
SIGNAL TO CUT-OFF: 0.01 (ref <1.00)

## 2017-08-17 ENCOUNTER — Telehealth: Payer: Self-pay | Admitting: *Deleted

## 2017-08-17 ENCOUNTER — Encounter: Payer: Self-pay | Admitting: Family Medicine

## 2017-08-17 NOTE — Telephone Encounter (Signed)
Patient called with concerns of his lab results, patient states they are not in his mychart and he would like the results. Please advise

## 2017-08-19 NOTE — Telephone Encounter (Signed)
Labs released to [facility] and patient has seen them.

## 2017-08-24 ENCOUNTER — Other Ambulatory Visit: Payer: Self-pay | Admitting: Family Medicine

## 2017-09-10 ENCOUNTER — Other Ambulatory Visit: Payer: Self-pay | Admitting: Family Medicine

## 2017-12-12 ENCOUNTER — Ambulatory Visit: Payer: Federal, State, Local not specified - PPO | Admitting: Gastroenterology

## 2017-12-12 ENCOUNTER — Encounter: Payer: Self-pay | Admitting: Gastroenterology

## 2017-12-12 VITALS — BP 128/88 | HR 72 | Ht 73.5 in | Wt 272.4 lb

## 2017-12-12 DIAGNOSIS — K5900 Constipation, unspecified: Secondary | ICD-10-CM

## 2017-12-12 DIAGNOSIS — R109 Unspecified abdominal pain: Secondary | ICD-10-CM

## 2017-12-12 DIAGNOSIS — K2270 Barrett's esophagus without dysplasia: Secondary | ICD-10-CM

## 2017-12-12 NOTE — Progress Notes (Signed)
HPI :  67 y/o male here for a follow up visit. I previously saw him in March 2017 for Barrett's esophagus and EGD. His EGD at that time showed a short segment Barrett's esophagus, pathology was consistent with Barrett's without dysplasia and stable from his last exam in 2014. He has been on on nexium 40mg  in tolerating this okay without any significant heartburn that bothers him routinely but he does have some breakthrough at times.Marland Kitchen  He is here today to be evaluated for abdominal pain. He states at the beginning of this month he had roughly 7 days of tightness in his abdomen along with discomfort. He states this was a diffuse discomfort involving his entire mid abdomen. During this time it was constant and did not relent. He was not evaluated at that time. He did have some nausea during the symptoms but no vomiting. He did have some decreased by mouth intake for a week during that time as he states he thought eating made slightly worse. He denied any change in his bowels at baseline, he had some mild constipation periodically. He denies any blood in his stools. He states about 3 weeks ago his pain went away completely and he has had no recurrent symptoms. He currently feels at baseline. He denies any trauma to the abdomen. He denied any NSAID use. He denied any alleviating or aggravating factors to his pain when it occurred. He does take chronic morphine for back pain. He last had imaging in 2009 which did not show any pathology in his abdomen.  Prior workup: EGD 01/29/2016 - 2cm hiatal hernia, irregular z-line, mild gastritis - path c/w BE without dysplasia, stable from last exam, biopsies negative for HP Colonoscopy 12/20/2014 - 2 small adenomas, diverticulosis - recall 12/2019 EGD 01/25/2013 - irregular z-line Gastric emptying study 02/19/2009 - delayed gastric emptying MRI abdomen 10/15/2008 - simple renal cysts.   Past Medical History:  Diagnosis Date  . Barrett's esophagus   . Chronic back pain     . Gastritis    Mild  . Gastroparesis   . GERD (gastroesophageal reflux disease)   . Headache   . Hyperglycemia   . Nephrolithiasis   . Renal cyst   . Stricture and stenosis of esophagus   . Stroke ([place]) 07-2015  . Vision abnormalities      Past Surgical History:  Procedure Laterality Date  . [place] SURGERY  2012  . COLONOSCOPY    . [place] SURGERY  2005, 2010   replaced L4 and L5  . UPPER GASTROINTESTINAL ENDOSCOPY     Family History  Problem Relation Age of Onset  . Hypertension Father   . Stroke Father   . Hypertension Mother   . Liver cancer Mother   . Hypertension Sister        2 other sisters has also  . Stroke Sister   . Stroke Sister   . Hypertension Brother        2nd brother has also  . Colon cancer Neg Hx   . Esophageal cancer Neg Hx   . Rectal cancer Neg Hx   . Stomach cancer Neg Hx   . Colon polyps Neg Hx    Social History   Tobacco Use  . Smoking status: Current Some Day Smoker    Packs/day: 0.25    Years: 30.00    Pack years: 7.50    Types: Cigarettes    Last attempt to quit: 08/02/2015    Years since quitting: 2.3  .  Smokeless tobacco: Never Used  . Tobacco comment: Counseling sheet given 3/15  Substance Use Topics  . Alcohol use: No    Alcohol/week: 0.0 oz  . Drug use: Yes    Types: Oxycodone, Morphine    Comment: Opana daily for chronic  back pain   Current Outpatient Medications  Medication Sig Dispense Refill  . aspirin 325 MG tablet Take 1 tablet (325 mg total) by mouth daily.    Marland Kitchen esomeprazole (NEXIUM) 40 MG capsule Take 1 capsule (40 mg total) by mouth daily before breakfast. Please fill brand name Nexium. No substitutions. Brand name has the best results for patient.Thank you. 90 capsule 3  . losartan (COZAAR) 25 MG tablet Take 1 tablet (25 mg total) by mouth daily. 90 tablet 2  . oxymorphone (OPANA) 10 MG tablet Take 10 mg by mouth every 6 (six) hours as needed for pain.     . sildenafil (VIAGRA) 100 MG tablet Take  one half to one tablet one hour prior to sexual activity. 6 tablet 6  . traZODone (DESYREL) 50 MG tablet TAKE 1 TABLET (50 MG TOTAL) BY MOUTH AT BEDTIME. 90 tablet 0   No current facility-administered medications for this visit.    No Known Allergies   Review of Systems: All systems reviewed and negative except where noted in HPI.   Lab Results  Component Value Date   WBC 8.0 09/22/2016   HGB 16.0 09/22/2016   HCT 47.0 09/22/2016   MCV 91.3 09/22/2016   PLT 203 09/22/2016    Lab Results  Component Value Date   CREATININE 1.06 08/15/2017   BUN 11 08/15/2017   NA 140 08/15/2017   K 4.2 08/15/2017   CL 107 08/15/2017   CO2 24 08/15/2017    Lab Results  Component Value Date   ALT 18 08/15/2017   AST 13 08/15/2017   ALKPHOS 70 08/15/2017   BILITOT 0.5 08/15/2017     Physical Exam: BP 128/88 (BP Location: Left Arm, Patient Position: Sitting, Cuff Size: Normal)   Pulse 72   Ht 6' 1.5" (1.867 m) Comment: height measured without shoes  Wt 272 lb 6 oz (123.5 kg)   BMI 35.45 kg/m  Constitutional: Pleasant,well-developed, male in no acute distress. HEENT: Normocephalic and atraumatic. Conjunctivae are normal. No scleral icterus. Neck supple.  Cardiovascular: Normal rate, regular rhythm.  Pulmonary/chest: Effort normal and breath sounds normal. No wheezing, rales or rhonchi. Abdominal: Soft, nondistended, nontender.  There are no masses palpable. No hepatomegaly. Extremities: no edema Lymphadenopathy: No cervical adenopathy noted. Neurological: Alert and oriented to person place and time. Skin: Skin is warm and dry. No rashes noted. Psychiatric: Normal mood and affect. Behavior is normal.   ASSESSMENT AND PLAN: 67 year old male here for reassessment of the following issues:  Abdominal pain - history as outlined above, one week of constant discomfort without any clear triggers or associated factors. This resolved without intervention and for the past 3 weeks he has  felt fine without any problems. Unclear what this represented, although I discussed possibilities with him. I asked him to keep an eye on this and to notify me if he has recurrent symptoms. I'm recommending a CT scan of the abdomen pelvis at the time of when he is having symptoms if it recurs. He agreed with the plan wll notify me if this is the case.  Barrett's esophagus - short segment, on once daily nexium. I discussed long-term risks of chronic PPI use, he will continue this given his diagnosis  of Barrett's. If he has some rare breakthrough he can try using Gaviscon as needed. Recall EGD to be done in March 2020 for surveillance.  Constipation - mild constipation, we'll try Citrucel daily to see if this helps. He for recall colonoscopy in February 2021 for history of polyps.  [place] Cellar, MD [facility] Pager [phone]

## 2017-12-12 NOTE — Patient Instructions (Signed)
If you are age 65 or older, your body mass index should be between 23-30. Your Body mass index is 35.45 kg/m. If this is out of the aforementioned range listed, please consider follow up with your Primary Care Provider.  If you are age 76 or younger, your body mass index should be between 19-25. Your Body mass index is 35.45 kg/m. If this is out of the aformentioned range listed, please consider follow up with your Primary Care Provider.    Thank you for entrusting me with your care and for choosing [facility], Dr. [place] Cellar

## 2017-12-29 ENCOUNTER — Other Ambulatory Visit: Payer: Self-pay | Admitting: Gastroenterology

## 2017-12-29 DIAGNOSIS — K219 Gastro-esophageal reflux disease without esophagitis: Secondary | ICD-10-CM

## 2018-01-16 ENCOUNTER — Telehealth: Payer: Self-pay | Admitting: Family Medicine

## 2018-01-16 ENCOUNTER — Other Ambulatory Visit: Payer: Self-pay | Admitting: *Deleted

## 2018-01-16 MED ORDER — OLMESARTAN MEDOXOMIL 20 MG PO TABS: 20.0000 mg | ORAL_TABLET | Freq: Every day | ORAL | 2 refills | Status: DC

## 2018-01-16 NOTE — Telephone Encounter (Signed)
Spoke with patient. Patient scheduled OV for 01/17/18 to discuss medication concerns with PCP. New Rx for B/P medication sent to pharmacy.

## 2018-01-16 NOTE — Telephone Encounter (Signed)
Copied from [place] [id number]. Topic: Inquiry >> Jan 16, 2018  2:26 PM Ramiyah Mcclenahan, [place], [place] wrote: Reason for [id number] is calling because he would like to know if he should be taking his Losartan 25 mg. Stated that 1: It's one recall (Which I advised him to call his pharmacy about the recall) 2: He read the ingredientsthat make losartan and would like to know if its safe for him to take. If someone could give him a call back about this at [phone]

## 2018-01-16 NOTE — Progress Notes (Signed)
[name] is a 67 y.o.male, who is here today to follow on HTN.  He was last seen in 08/2017 for his CPE. Since his last visit he has follow with GI.  She called yesterday requesting changing Losartan for a different antihypertensive.  He is concerned about the fact that it has been a recall due to "something causing cancer." He is not sure if his medication was involved in contamination but still he does not feel comfortable continuing Losartan.  I recommended Benicar 20 mg daily but he wanted to come to the office before starting medication.  He thinks Benicar and Losartan at the same medication,    He has not noted unusual headache, visual changes, exertional chest pain,  focal weakness, or edema.    Lab Results  Component Value Date   CREATININE 1.06 08/15/2017   BUN 11 08/15/2017   NA 140 08/15/2017   K 4.2 08/15/2017   CL 107 08/15/2017   CO2 24 08/15/2017    BP readings at 140-150's/90's.  When inquired about dyspnea, he states that "sometimes" he has shortness of breath. It has been intermittent for a year or so. It happens usually at rest, he does not have any problem while exercising, and for a couple minutes.  He denies associated chest pain, diaphoresis, or palpitations. He has history of GERD, currently he is on Nexium 40 mg.  He has noted some urge to burp associated with shortness of breath. He has not had heartburn, nausea, vomiting, or abdominal pain.  Today he had a mild frontal headache, resolved.    Review of Systems  Constitutional: Negative for activity change, appetite change, fatigue and fever.  HENT: Negative for nosebleeds, sore throat and trouble swallowing.   Eyes: Negative for redness and visual disturbance.  Respiratory: Positive for shortness of breath. Negative for cough and wheezing.   Cardiovascular: Negative for chest pain, palpitations and leg swelling.  Gastrointestinal: Negative for abdominal pain, nausea and vomiting.    Genitourinary: Negative for decreased urine volume and hematuria.  Skin: Negative for rash.  Neurological: Negative for dizziness, syncope and weakness.  Psychiatric/Behavioral: Positive for sleep disturbance. Negative for confusion. The patient is nervous/anxious.      Current Outpatient Medications on File Prior to Visit  Medication Sig Dispense Refill  . aspirin 325 MG tablet Take 1 tablet (325 mg total) by mouth daily.    Marland Kitchen NEXIUM 40 MG capsule TAKE 1 CAPSULE DAILY AT 12 NOON. 90 capsule 3  . olmesartan (BENICAR) 20 MG tablet Take 1 tablet (20 mg total) by mouth daily. 30 tablet 2  . oxymorphone (OPANA) 10 MG tablet Take 10 mg by mouth every 6 (six) hours as needed for pain.     . sildenafil (VIAGRA) 100 MG tablet Take one half to one tablet one hour prior to sexual activity. 6 tablet 6  . traZODone (DESYREL) 50 MG tablet TAKE 1 TABLET (50 MG TOTAL) BY MOUTH AT BEDTIME. 90 tablet 0   No current facility-administered medications on file prior to visit.      Past Medical History:  Diagnosis Date  . Barrett's esophagus   . Chronic back pain   . Gastritis    Mild  . Gastroparesis   . GERD (gastroesophageal reflux disease)   . Headache   . Hyperglycemia   . Nephrolithiasis   . Renal cyst   . Stricture and stenosis of esophagus   . Stroke ([place]) 07-2015  . Vision abnormalities  No Known Allergies  Social History   Socioeconomic History  . Marital status: Married    Spouse name: None  . Number of children: 3  . Years of education: None  . Highest education level: None  Social Needs  . Financial resource strain: None  . Food insecurity - worry: None  . Food insecurity - inability: None  . Transportation needs - medical: None  . Transportation needs - non-medical: None  Occupational History  . Occupation: retired    Fish farm manager: [employer]  Tobacco Use  . Smoking status: Current Some Day Smoker    Packs/day: 0.25    Years: 30.00    Pack years: 7.50     Types: Cigarettes    Last attempt to quit: 08/02/2015    Years since quitting: 2.4  . Smokeless tobacco: Never Used  . Tobacco comment: Counseling sheet given 3/15  Substance and Sexual Activity  . Alcohol use: No    Alcohol/week: 0.0 oz  . Drug use: Yes    Types: Oxycodone, Morphine    Comment: Opana daily for chronic  back pain  . Sexual activity: Yes  Other Topics Concern  . None  Social History Narrative  . None    Vitals:   01/17/18 1604 01/17/18 1701  BP: 126/80 140/80  Pulse: 74   Resp: 12   Temp: 98.4 F (36.9 C)   SpO2: 97%    Body mass index is 35.99 kg/m.  Physical Exam  Nursing note reviewed. Constitutional: He is oriented to person, place, and time. He appears well-developed. No distress.  HENT:  Head: Normocephalic and atraumatic.  Mouth/Throat: Oropharynx is clear and moist and mucous membranes are normal.  Eyes: Conjunctivae are normal. Pupils are equal, round, and reactive to light.  Cardiovascular: Normal rate and regular rhythm.  No murmur heard. Pulses:      Dorsalis pedis pulses are 2+ on the right side, and 2+ on the left side.  Respiratory: Effort normal and breath sounds normal. No respiratory distress.  GI: Soft. He exhibits no mass. There is no hepatomegaly. There is no tenderness.  Musculoskeletal: He exhibits no edema or tenderness.  Lymphadenopathy:    He has no cervical adenopathy.  Neurological: He is alert and oriented to person, place, and time. He has normal strength. Gait normal.  Skin: Skin is warm. No rash noted. No erythema.  Psychiatric: His mood appears anxious. Cognition and memory are normal.  Well groomed, good eye contact.    ASSESSMENT AND PLAN:   [name] was seen today for discuss medications.  Diagnoses and all orders for this visit:  Shortness of breath  We discussed the possible etiologies. The fact that he does not have problem while exercising is reassuring. Some of his chronic medical problems could  be aggravating/causing problem: GERD, anxiety, chronic back pain, and tobacco use almost some. Strongly recommend quitting smoking. At this point I will obtain further workup is needed.  He was instructed about warning signs.  HTN (hypertension) We discussed different classes of antihypertensive medication, explained that Benicar is not the same that Losartan, they are both ARB, so he will have similar benefit. He agrees with starting Benicar 20 mg daily. Continue low-salt diet. He will continue monitoring BP at home. Instructed about warning signs. Follow-up in 2 months, he was instructed to bring his BP monitor.    -[name] was advised to return sooner than planned today if new concerns arise.     Malka So  [place], [place]. [place] office.

## 2018-01-16 NOTE — Telephone Encounter (Signed)
Usually I prefer not to change medication but if he insists I like Benicar 20 mg daily. Not sure about insurance coverage.  Thanks, Richard Vaughn

## 2018-01-17 ENCOUNTER — Ambulatory Visit: Payer: Federal, State, Local not specified - PPO | Admitting: Family Medicine

## 2018-01-17 ENCOUNTER — Encounter: Payer: Self-pay | Admitting: Family Medicine

## 2018-01-17 VITALS — BP 140/80 | HR 74 | Temp 98.4°F | Resp 12 | Ht 73.5 in | Wt 276.5 lb

## 2018-01-17 DIAGNOSIS — R0602 Shortness of breath: Secondary | ICD-10-CM

## 2018-01-17 DIAGNOSIS — I10 Essential (primary) hypertension: Secondary | ICD-10-CM

## 2018-01-17 NOTE — Assessment & Plan Note (Signed)
We discussed different classes of antihypertensive medication, explained that Benicar is not the same that Losartan, they are both ARB, so he will have similar benefit. He agrees with starting Benicar 20 mg daily. Continue low-salt diet. He will continue monitoring BP at home. Instructed about warning signs. Follow-up in 2 months, he was instructed to bring his BP monitor.

## 2018-01-17 NOTE — Patient Instructions (Signed)
A few things to remember from today's visit:   Essential hypertension  Shortness of breath   DASH Eating Plan DASH stands for "Dietary Approaches to Stop Hypertension." The DASH eating plan is a healthy eating plan that has been shown to reduce high blood pressure (hypertension). It may also reduce your risk for type 2 diabetes, heart disease, and stroke. The DASH eating plan may also help with weight loss. What are tips for following this plan? General guidelines  Avoid eating more than 2,300 mg (milligrams) of salt (sodium) a day. If you have hypertension, you may need to reduce your sodium intake to 1,500 mg a day.  Limit alcohol intake to no more than 1 drink a day for nonpregnant women and 2 drinks a day for men. One drink equals 12 oz of beer, 5 oz of wine, or 1 oz of hard liquor.  Work with your health care provider to maintain a healthy body weight or to lose weight. Ask what an ideal weight is for you.  Get at least 30 minutes of exercise that causes your heart to beat faster (aerobic exercise) most days of the week. Activities may include walking, swimming, or biking.  Work with your health care provider or diet and nutrition specialist (dietitian) to adjust your eating plan to your individual calorie needs. Reading food labels  Check food labels for the amount of sodium per serving. Choose foods with less than 5 percent of the Daily Value of sodium. Generally, foods with less than 300 mg of sodium per serving fit into this eating plan.  To find whole grains, look for the word "whole" as the first word in the ingredient list. Shopping  Buy products labeled as "low-sodium" or "no salt added."  Buy fresh foods. Avoid canned foods and premade or frozen meals. Cooking  Avoid adding salt when cooking. Use salt-free seasonings or herbs instead of table salt or sea salt. Check with your health care provider or pharmacist before using salt substitutes.  Do not fry foods. Cook  foods using healthy methods such as baking, boiling, grilling, and broiling instead.  Cook with heart-healthy oils, such as olive, canola, soybean, or sunflower oil. Meal planning   Eat a balanced diet that includes: ? 5 or more servings of fruits and vegetables each day. At each meal, try to fill half of your plate with fruits and vegetables. ? Up to 6-8 servings of whole grains each day. ? Less than 6 oz of lean meat, poultry, or fish each day. A 3-oz serving of meat is about the same size as a deck of cards. One egg equals 1 oz. ? 2 servings of low-fat dairy each day. ? A serving of nuts, seeds, or beans 5 times each week. ? Heart-healthy fats. Healthy fats called Omega-3 fatty acids are found in foods such as flaxseeds and coldwater fish, like sardines, salmon, and mackerel.  Limit how much you eat of the following: ? Canned or prepackaged foods. ? Food that is high in trans fat, such as fried foods. ? Food that is high in saturated fat, such as fatty meat. ? Sweets, desserts, sugary drinks, and other foods with added sugar. ? Full-fat dairy products.  Do not salt foods before eating.  Try to eat at least 2 vegetarian meals each week.  Eat more home-cooked food and less restaurant, buffet, and fast food.  When eating at a restaurant, ask that your food be prepared with less salt or no salt, if possible. What  foods are recommended? The items listed may not be a complete list. Talk with your dietitian about what dietary choices are best for you. Grains Whole-grain or whole-wheat bread. Whole-grain or whole-wheat pasta. Brown rice. [place]. Bulgur. Whole-grain and low-sodium cereals. Pita bread. Low-fat, low-sodium crackers. Whole-wheat flour tortillas. Vegetables Fresh or frozen vegetables (raw, steamed, roasted, or grilled). Low-sodium or reduced-sodium tomato and vegetable juice. Low-sodium or reduced-sodium tomato sauce and tomato paste. Low-sodium or reduced-sodium  canned vegetables. Fruits All fresh, dried, or frozen fruit. Canned fruit in natural juice (without added sugar). Meat and other protein foods Skinless chicken or [place]. Ground chicken or [place]. Pork with fat trimmed off. Fish and seafood. Egg whites. Dried beans, peas, or lentils. Unsalted nuts, nut butters, and seeds. Unsalted canned beans. Lean cuts of beef with fat trimmed off. Low-sodium, lean deli meat. Dairy Low-fat (1%) or fat-free (skim) milk. Fat-free, low-fat, or reduced-fat cheeses. Nonfat, low-sodium ricotta or cottage cheese. Low-fat or nonfat yogurt. Low-fat, low-sodium cheese. Fats and oils Soft margarine without trans fats. Vegetable oil. Low-fat, reduced-fat, or light mayonnaise and salad dressings (reduced-sodium). Canola, safflower, olive, soybean, and sunflower oils. Avocado. Seasoning and other foods Herbs. Spices. Seasoning mixes without salt. Unsalted popcorn and pretzels. Fat-free sweets. What foods are not recommended? The items listed may not be a complete list. Talk with your dietitian about what dietary choices are best for you. Grains Baked goods made with fat, such as croissants, muffins, or some breads. Dry pasta or rice meal packs. Vegetables Creamed or fried vegetables. Vegetables in a cheese sauce. Regular canned vegetables (not low-sodium or reduced-sodium). Regular canned tomato sauce and paste (not low-sodium or reduced-sodium). Regular tomato and vegetable juice (not low-sodium or reduced-sodium). Angie Fava. Olives. Fruits Canned fruit in a light or heavy syrup. Fried fruit. Fruit in cream or butter sauce. Meat and other protein foods Fatty cuts of meat. Ribs. Fried meat. Berniece Salines. Sausage. Bologna and other processed lunch meats. Salami. Fatback. Hotdogs. Bratwurst. Salted nuts and seeds. Canned beans with added salt. Canned or smoked fish. Whole eggs or egg yolks. Chicken or [place] with skin. Dairy Whole or 2% milk, cream, and half-and-half. Whole or  full-fat cream cheese. Whole-fat or sweetened yogurt. Full-fat cheese. Nondairy creamers. Whipped toppings. Processed cheese and cheese spreads. Fats and oils Butter. Stick margarine. Lard. Shortening. Ghee. Bacon fat. Tropical oils, such as coconut, palm kernel, or palm oil. Seasoning and other foods Salted popcorn and pretzels. Onion salt, garlic salt, seasoned salt, table salt, and sea salt. Worcestershire sauce. Tartar sauce. Barbecue sauce. Teriyaki sauce. Soy sauce, including reduced-sodium. Steak sauce. Canned and packaged gravies. Fish sauce. Oyster sauce. Cocktail sauce. Horseradish that you find on the shelf. Ketchup. Mustard. Meat flavorings and tenderizers. Bouillon cubes. Hot sauce and Tabasco sauce. Premade or packaged marinades. Premade or packaged taco seasonings. Relishes. Regular salad dressings. Where to find more information:  National Heart, Lung, and [place]: https://wilson-eaton.com/  American Heart Association: www.heart.org Summary  The DASH eating plan is a healthy eating plan that has been shown to reduce high blood pressure (hypertension). It may also reduce your risk for type 2 diabetes, heart disease, and stroke.  With the DASH eating plan, you should limit salt (sodium) intake to 2,300 mg a day. If you have hypertension, you may need to reduce your sodium intake to 1,500 mg a day.  When on the DASH eating plan, aim to eat more fresh fruits and vegetables, whole grains, lean proteins, low-fat dairy, and heart-healthy fats.  Work with  your health care provider or diet and nutrition specialist (dietitian) to adjust your eating plan to your individual calorie needs. This information is not intended to replace advice given to you by your health care provider. Make sure you discuss any questions you have with your health care provider. Document Released: 10/21/2011 Document Revised: 10/25/2016 Document Reviewed: 10/25/2016 Elsevier Interactive Patient Education  [employer].   Please be sure medication list is accurate. If a new problem present, please set up appointment sooner than planned today.

## 2018-03-19 NOTE — Progress Notes (Deleted)
Richard Vaughn is a 67 y.o.male, who is here today to follow on HTN.  Losartan was changed to Benicar last OV, 01/17/18.He was concerned about Losartan contamination and recalls. On Benicar 20 mg aily.  ***taking medications as instructed, no side effects reported.  ***has not noted unusual headache, visual changes, exertional chest pain,  focal weakness, or edema.    Lab Results  Component Value Date   CREATININE 1.06 08/15/2017   BUN 11 08/15/2017   NA 140 08/15/2017   K 4.2 08/15/2017   CL 107 08/15/2017   CO2 24 08/15/2017   Last OV he was c/o SOB. ***   Review of Systems  Constitutional: Positive for fatigue (o more than usual). Negative for activity change, appetite change and fever.  HENT: Negative for nosebleeds, sore throat and trouble swallowing.   Eyes: Negative for redness and visual disturbance.  Respiratory: Negative for cough, shortness of breath and wheezing.   Cardiovascular: Negative for chest pain, palpitations and leg swelling.  Gastrointestinal: Negative for abdominal pain, nausea and vomiting.  Genitourinary: Negative for decreased urine volume and hematuria.  Neurological: Negative for dizziness, weakness and headaches.     Current Outpatient Medications on File Prior to Visit  Medication Sig Dispense Refill  . aspirin 325 MG tablet Take 1 tablet (325 mg total) by mouth daily.    Marland Kitchen NEXIUM 40 MG capsule TAKE 1 CAPSULE DAILY AT 12 NOON. 90 capsule 3  . olmesartan (BENICAR) 20 MG tablet Take 1 tablet (20 mg total) by mouth daily. 30 tablet 2  . oxymorphone (OPANA) 10 MG tablet Take 10 mg by mouth every 6 (six) hours as needed for pain.     . sildenafil (VIAGRA) 100 MG tablet Take one half to one tablet one hour prior to sexual activity. 6 tablet 6  . traZODone (DESYREL) 50 MG tablet TAKE 1 TABLET (50 MG TOTAL) BY MOUTH AT BEDTIME. 90 tablet 0   No current facility-administered medications on file prior to visit.      Past Medical History:    Diagnosis Date  . Barrett's esophagus   . Chronic back pain   . Gastritis    Mild  . Gastroparesis   . GERD (gastroesophageal reflux disease)   . Headache   . Hyperglycemia   . Nephrolithiasis   . Renal cyst   . Stricture and stenosis of esophagus   . Stroke ([place]) 07-2015  . Vision abnormalities     No Known Allergies  Social History   Socioeconomic History  . Marital status: Married    Spouse name: Not on file  . Number of children: 3  . Years of education: Not on file  . Highest education level: Not on file  Occupational History  . Occupation: retired    Fish farm manager: [employer]  Social Needs  . Financial resource strain: Not on file  . Food insecurity:    Worry: Not on file    Inability: Not on file  . Transportation needs:    Medical: Not on file    Non-medical: Not on file  Tobacco Use  . Smoking status: Current Some Day Smoker    Packs/day: 0.25    Years: 30.00    Pack years: 7.50    Types: Cigarettes    Last attempt to quit: 08/02/2015    Years since quitting: 2.6  . Smokeless tobacco: Never Used  . Tobacco comment: Counseling sheet given 3/15  Substance and Sexual Activity  . Alcohol  use: No    Alcohol/week: 0.0 oz  . Drug use: Yes    Types: Oxycodone, Morphine    Comment: Opana daily for chronic  back pain  . Sexual activity: Yes  Lifestyle  . Physical activity:    Days per week: Not on file    Minutes per session: Not on file  . Stress: Not on file  Relationships  . Social connections:    Talks on phone: Not on file    Gets together: Not on file    Attends religious service: Not on file    Active member of club or organization: Not on file    Attends meetings of clubs or organizations: Not on file    Relationship status: Not on file  Other Topics Concern  . Not on file  Social History Narrative  . Not on file    There were no vitals filed for this visit. There is no height or weight on file to calculate BMI.    Physical Exam   Constitutional: He is oriented to person, place, and time. He appears well-developed. No distress.  HENT:  Head: Normocephalic and atraumatic.  Mouth/Throat: Oropharynx is clear and moist and mucous membranes are normal.  Eyes: Pupils are equal, round, and reactive to light. Conjunctivae are normal.  Cardiovascular: Normal rate and regular rhythm.  No murmur heard. Pulses:      Dorsalis pedis pulses are 2+ on the right side, and 2+ on the left side.  Respiratory: Effort normal and breath sounds normal. No respiratory distress.  GI: Soft. He exhibits no mass. There is no hepatomegaly. There is no tenderness.  Musculoskeletal: He exhibits no edema.  Lymphadenopathy:    He has no cervical adenopathy.  Neurological: He is alert and oriented to person, place, and time. He has normal strength. Gait normal.  Skin: Skin is warm. No erythema.  Psychiatric: He has a normal mood and affect. Cognition and memory are normal.  Well groomed, good eye contact.    ASSESSMENT AND PLAN:   There are no diagnoses linked to this encounter.    -[name] advised to return sooner than planned today if new concerns arise.     Srihitha Tagliaferri G. [place], MD  [facility]. [place] office.

## 2018-03-20 ENCOUNTER — Ambulatory Visit: Payer: Federal, State, Local not specified - PPO | Admitting: Family Medicine

## 2018-03-20 DIAGNOSIS — Z0289 Encounter for other administrative examinations: Secondary | ICD-10-CM

## 2018-03-24 ENCOUNTER — Ambulatory Visit: Payer: Federal, State, Local not specified - PPO | Admitting: Family Medicine

## 2018-03-25 ENCOUNTER — Other Ambulatory Visit: Payer: Self-pay | Admitting: Family Medicine

## 2018-03-25 DIAGNOSIS — G4700 Insomnia, unspecified: Secondary | ICD-10-CM

## 2018-03-30 ENCOUNTER — Telehealth: Payer: Self-pay

## 2018-03-30 ENCOUNTER — Telehealth: Payer: Self-pay | Admitting: Gastroenterology

## 2018-03-30 NOTE — Telephone Encounter (Signed)
Called and spoke to [facility] after pt called and said he was told Nexium needed a Prior Auth. Pt has been taking this for a long time. They had a change in their system which caused something to generate the need to re- prior authorize med for pt.  Because of his Dx of Barrett's he has a lifetime approval for Nexium. Representative processed the Prior Auth over the phone and we were granted approval.   Approval: Life time.  Spoke to [place] at [phone]. They will notify pt and pharmacy.

## 2018-03-30 NOTE — Telephone Encounter (Signed)
Pt called to inform that Nexium needs PA, Rosie Fate PA phone is [phone].

## 2018-03-30 NOTE — Telephone Encounter (Signed)
See telephone note from same day

## 2018-04-03 NOTE — Telephone Encounter (Signed)
We have received a prior auth fax from [facility] that patient's "Nexium has been approved from 02/28/18 and will remain in effect for as long as the patient is eligible under this plan."

## 2018-04-14 ENCOUNTER — Other Ambulatory Visit: Payer: Self-pay | Admitting: Family Medicine

## 2018-04-15 ENCOUNTER — Encounter (HOSPITAL_COMMUNITY): Payer: Federal, State, Local not specified - PPO

## 2018-04-15 ENCOUNTER — Encounter (HOSPITAL_COMMUNITY): Payer: Self-pay | Admitting: Emergency Medicine

## 2018-04-15 ENCOUNTER — Inpatient Hospital Stay (HOSPITAL_COMMUNITY): Payer: Federal, State, Local not specified - PPO

## 2018-04-15 ENCOUNTER — Inpatient Hospital Stay (HOSPITAL_COMMUNITY)
Admission: EM | Admit: 2018-04-15 | Discharge: 2018-04-17 | DRG: 064 | Disposition: A | Discharge status: Home / Self Care | Payer: Federal, State, Local not specified - PPO | Attending: Neurology | Admitting: Neurology

## 2018-04-15 ENCOUNTER — Emergency Department (HOSPITAL_COMMUNITY): Payer: Federal, State, Local not specified - PPO

## 2018-04-15 ENCOUNTER — Other Ambulatory Visit: Payer: Self-pay

## 2018-04-15 DIAGNOSIS — F17200 Nicotine dependence, unspecified, uncomplicated: Secondary | ICD-10-CM | POA: Diagnosis present

## 2018-04-15 DIAGNOSIS — I161 Hypertensive emergency: Secondary | ICD-10-CM | POA: Diagnosis not present

## 2018-04-15 DIAGNOSIS — F17210 Nicotine dependence, cigarettes, uncomplicated: Secondary | ICD-10-CM | POA: Diagnosis present

## 2018-04-15 DIAGNOSIS — I69391 Dysphagia following cerebral infarction: Secondary | ICD-10-CM | POA: Diagnosis not present

## 2018-04-15 DIAGNOSIS — I69991 Dysphagia following unspecified cerebrovascular disease: Secondary | ICD-10-CM

## 2018-04-15 DIAGNOSIS — Z6835 Body mass index (BMI) 35.0-35.9, adult: Secondary | ICD-10-CM | POA: Diagnosis not present

## 2018-04-15 DIAGNOSIS — I6930 Unspecified sequelae of cerebral infarction: Secondary | ICD-10-CM

## 2018-04-15 DIAGNOSIS — G8194 Hemiplegia, unspecified affecting left nondominant side: Secondary | ICD-10-CM | POA: Diagnosis present

## 2018-04-15 DIAGNOSIS — R29810 Facial weakness: Secondary | ICD-10-CM | POA: Diagnosis present

## 2018-04-15 DIAGNOSIS — R471 Dysarthria and anarthria: Secondary | ICD-10-CM | POA: Diagnosis present

## 2018-04-15 DIAGNOSIS — R29705 NIHSS score 5: Secondary | ICD-10-CM | POA: Diagnosis present

## 2018-04-15 DIAGNOSIS — I69354 Hemiplegia and hemiparesis following cerebral infarction affecting left non-dominant side: Secondary | ICD-10-CM

## 2018-04-15 DIAGNOSIS — Z720 Tobacco use: Secondary | ICD-10-CM

## 2018-04-15 DIAGNOSIS — G936 Cerebral edema: Secondary | ICD-10-CM | POA: Diagnosis not present

## 2018-04-15 DIAGNOSIS — E785 Hyperlipidemia, unspecified: Secondary | ICD-10-CM | POA: Diagnosis present

## 2018-04-15 DIAGNOSIS — G4733 Obstructive sleep apnea (adult) (pediatric): Secondary | ICD-10-CM | POA: Diagnosis not present

## 2018-04-15 DIAGNOSIS — G4489 Other headache syndrome: Secondary | ICD-10-CM | POA: Diagnosis not present

## 2018-04-15 DIAGNOSIS — I10 Essential (primary) hypertension: Secondary | ICD-10-CM | POA: Diagnosis not present

## 2018-04-15 DIAGNOSIS — G8929 Other chronic pain: Secondary | ICD-10-CM

## 2018-04-15 DIAGNOSIS — G894 Chronic pain syndrome: Secondary | ICD-10-CM

## 2018-04-15 DIAGNOSIS — Z79899 Other long term (current) drug therapy: Secondary | ICD-10-CM

## 2018-04-15 DIAGNOSIS — K3184 Gastroparesis: Secondary | ICD-10-CM | POA: Diagnosis not present

## 2018-04-15 DIAGNOSIS — M545 Low back pain, unspecified: Secondary | ICD-10-CM

## 2018-04-15 DIAGNOSIS — K2270 Barrett's esophagus without dysplasia: Secondary | ICD-10-CM | POA: Diagnosis not present

## 2018-04-15 DIAGNOSIS — I610 Nontraumatic intracerebral hemorrhage in hemisphere, subcortical: Principal | ICD-10-CM

## 2018-04-15 DIAGNOSIS — R1310 Dysphagia, unspecified: Secondary | ICD-10-CM | POA: Diagnosis not present

## 2018-04-15 DIAGNOSIS — E876 Hypokalemia: Secondary | ICD-10-CM | POA: Diagnosis present

## 2018-04-15 DIAGNOSIS — Z8673 Personal history of transient ischemic attack (TIA), and cerebral infarction without residual deficits: Secondary | ICD-10-CM

## 2018-04-15 DIAGNOSIS — Z7982 Long term (current) use of aspirin: Secondary | ICD-10-CM | POA: Diagnosis not present

## 2018-04-15 DIAGNOSIS — R0902 Hypoxemia: Secondary | ICD-10-CM | POA: Diagnosis not present

## 2018-04-15 DIAGNOSIS — I639 Cerebral infarction, unspecified: Secondary | ICD-10-CM

## 2018-04-15 DIAGNOSIS — I619 Nontraumatic intracerebral hemorrhage, unspecified: Secondary | ICD-10-CM | POA: Diagnosis present

## 2018-04-15 DIAGNOSIS — R0683 Snoring: Secondary | ICD-10-CM | POA: Diagnosis present

## 2018-04-15 DIAGNOSIS — K219 Gastro-esophageal reflux disease without esophagitis: Secondary | ICD-10-CM | POA: Diagnosis not present

## 2018-04-15 DIAGNOSIS — F1210 Cannabis abuse, uncomplicated: Secondary | ICD-10-CM | POA: Diagnosis present

## 2018-04-15 DIAGNOSIS — R4781 Slurred speech: Secondary | ICD-10-CM | POA: Diagnosis not present

## 2018-04-15 DIAGNOSIS — R531 Weakness: Secondary | ICD-10-CM | POA: Diagnosis not present

## 2018-04-15 DIAGNOSIS — E669 Obesity, unspecified: Secondary | ICD-10-CM | POA: Diagnosis present

## 2018-04-15 HISTORY — DX: Nontraumatic intracerebral hemorrhage, unspecified: I61.9

## 2018-04-15 LAB — I-STAT CHEM 8, ED
BUN: 7 mg/dL (ref 6–20)
CREATININE: 1.1 mg/dL (ref 0.61–1.24)
Calcium, Ion: 1.06 mmol/L — ABNORMAL LOW (ref 1.15–1.40)
Chloride: 104 mmol/L (ref 101–111)
Glucose, Bld: 106 mg/dL — ABNORMAL HIGH (ref 65–99)
HEMATOCRIT: 43 % (ref 39.0–52.0)
Hemoglobin: 14.6 g/dL (ref 13.0–17.0)
Potassium: 3.4 mmol/L — ABNORMAL LOW (ref 3.5–5.1)
Sodium: 140 mmol/L (ref 135–145)
TCO2: 22 mmol/L (ref 22–32)

## 2018-04-15 LAB — HEMOGLOBIN A1C
HEMOGLOBIN A1C: 5.3 % (ref 4.8–5.6)
MEAN PLASMA GLUCOSE: 105.41 mg/dL

## 2018-04-15 LAB — RAPID URINE DRUG SCREEN, HOSP PERFORMED
Amphetamines: NOT DETECTED
BARBITURATES: NOT DETECTED
BENZODIAZEPINES: NOT DETECTED
COCAINE: NOT DETECTED
Opiates: NOT DETECTED
TETRAHYDROCANNABINOL: POSITIVE — AB

## 2018-04-15 LAB — DIFFERENTIAL
Abs Immature Granulocytes: 0 10*3/uL (ref 0.0–0.1)
Basophils Absolute: 0 10*3/uL (ref 0.0–0.1)
Basophils Relative: 0 %
EOS ABS: 0.1 10*3/uL (ref 0.0–0.7)
EOS PCT: 1 %
Immature Granulocytes: 0 %
LYMPHS ABS: 3.4 10*3/uL (ref 0.7–4.0)
LYMPHS PCT: 39 %
MONO ABS: 0.6 10*3/uL (ref 0.1–1.0)
MONOS PCT: 6 %
Neutro Abs: 4.7 10*3/uL (ref 1.7–7.7)
Neutrophils Relative %: 54 %

## 2018-04-15 LAB — COMPREHENSIVE METABOLIC PANEL
ALK PHOS: 77 U/L (ref 38–126)
ALT: 20 U/L (ref 17–63)
AST: 17 U/L (ref 15–41)
Albumin: 4 g/dL (ref 3.5–5.0)
Anion gap: 9 (ref 5–15)
BILIRUBIN TOTAL: 0.7 mg/dL (ref 0.3–1.2)
BUN: 8 mg/dL (ref 6–20)
CALCIUM: 8.7 mg/dL — AB (ref 8.9–10.3)
CO2: 23 mmol/L (ref 22–32)
Chloride: 108 mmol/L (ref 101–111)
Creatinine, Ser: 1.26 mg/dL — ABNORMAL HIGH (ref 0.61–1.24)
GFR, EST NON AFRICAN AMERICAN: 59 mL/min — AB (ref >60)
Glucose, Bld: 109 mg/dL — ABNORMAL HIGH (ref 65–99)
Potassium: 3.5 mmol/L (ref 3.5–5.1)
Sodium: 140 mmol/L (ref 135–145)
TOTAL PROTEIN: 7 g/dL (ref 6.5–8.1)

## 2018-04-15 LAB — URINALYSIS, COMPLETE (UACMP) WITH MICROSCOPIC
BACTERIA UA: NONE SEEN
BILIRUBIN URINE: NEGATIVE
GLUCOSE, UA: NEGATIVE mg/dL
Hgb urine dipstick: NEGATIVE
Ketones, ur: NEGATIVE mg/dL
LEUKOCYTES UA: NEGATIVE
NITRITE: NEGATIVE
PH: 7 (ref 5.0–8.0)
Protein, ur: NEGATIVE mg/dL
SPECIFIC GRAVITY, URINE: 1.009 (ref 1.005–1.030)

## 2018-04-15 LAB — I-STAT TROPONIN, ED: TROPONIN I, POC: 0 ng/mL (ref 0.00–0.08)

## 2018-04-15 LAB — LIPID PANEL
Cholesterol: 112 mg/dL (ref 0–200)
HDL: 20 mg/dL — AB (ref >40)
LDL CALC: 27 mg/dL (ref 0–99)
Total CHOL/HDL Ratio: 5.6 RATIO
Triglycerides: 327 mg/dL — ABNORMAL HIGH (ref <150)
VLDL: 65 mg/dL — ABNORMAL HIGH (ref 0–40)

## 2018-04-15 LAB — CBC
HEMATOCRIT: 43.8 % (ref 39.0–52.0)
Hemoglobin: 14.7 g/dL (ref 13.0–17.0)
MCH: 30.3 pg (ref 26.0–34.0)
MCHC: 33.6 g/dL (ref 30.0–36.0)
MCV: 90.3 fL (ref 78.0–100.0)
PLATELETS: 165 10*3/uL (ref 150–400)
RBC: 4.85 MIL/uL (ref 4.22–5.81)
RDW: 12.2 % (ref 11.5–15.5)
WBC: 8.8 10*3/uL (ref 4.0–10.5)

## 2018-04-15 LAB — MRSA PCR SCREENING: MRSA BY PCR: NEGATIVE

## 2018-04-15 LAB — ETHANOL: Alcohol, Ethyl (B): <10 mg/dL (ref <10)

## 2018-04-15 LAB — PROTIME-INR
INR: 1.06
Prothrombin Time: 13.7 seconds (ref 11.4–15.2)

## 2018-04-15 LAB — APTT: aPTT: 33 seconds (ref 24–36)

## 2018-04-15 LAB — CBG MONITORING, ED: GLUCOSE-CAPILLARY: 108 mg/dL — AB (ref 65–99)

## 2018-04-15 MED ORDER — ACETAMINOPHEN 160 MG/5ML PO SOLN: 650.0000 mg | ORAL | Status: DC | PRN

## 2018-04-15 MED ORDER — CLEVIDIPINE BUTYRATE 0.5 MG/ML IV EMUL
0.0000 mg/h | INTRAVENOUS | Status: DC
  Administered 2018-04-15: 10 mg/h via INTRAVENOUS
  Administered 2018-04-15: 20 mg/h via INTRAVENOUS
  Administered 2018-04-15: 18 mg/h via INTRAVENOUS
  Administered 2018-04-15: 13 mg/h via INTRAVENOUS
  Administered 2018-04-15: 1 mg/h via INTRAVENOUS
  Administered 2018-04-15: 9 mg/h via INTRAVENOUS
  Administered 2018-04-15: 20 mg/h via INTRAVENOUS
  Administered 2018-04-16: 4 mg/h via INTRAVENOUS
  Filled 2018-04-15 (×8): qty 50

## 2018-04-15 MED ORDER — SENNOSIDES-DOCUSATE SODIUM 8.6-50 MG PO TABS
1.0000 | ORAL_TABLET | Freq: Two times a day (BID) | ORAL | Status: DC
  Administered 2018-04-16 – 2018-04-17 (×3): 1 via ORAL
  Filled 2018-04-15 (×4): qty 1

## 2018-04-15 MED ORDER — POTASSIUM CHLORIDE 20 MEQ/15ML (10%) PO SOLN: 20.0000 meq | ORAL | Status: AC

## 2018-04-15 MED ORDER — LABETALOL HCL 5 MG/ML IV SOLN: 20.0000 mg | Freq: Once | INTRAVENOUS | Status: DC

## 2018-04-15 MED ORDER — FAMOTIDINE 20 MG PO TABS
20.0000 mg | ORAL_TABLET | Freq: Every day | ORAL | Status: DC
  Filled 2018-04-15: qty 1

## 2018-04-15 MED ORDER — STROKE: EARLY STAGES OF RECOVERY BOOK
Freq: Once | Status: AC
  Administered 2018-04-15: 12:00:00
  Filled 2018-04-15: qty 1

## 2018-04-15 MED ORDER — ONDANSETRON HCL 4 MG/2ML IJ SOLN
4.0000 mg | Freq: Four times a day (QID) | INTRAMUSCULAR | Status: DC | PRN
  Administered 2018-04-15: 4 mg via INTRAVENOUS
  Filled 2018-04-15: qty 2

## 2018-04-15 MED ORDER — ACETAMINOPHEN 325 MG PO TABS
650.0000 mg | ORAL_TABLET | ORAL | Status: DC | PRN
Start: 2018-04-15 — End: 2018-04-17

## 2018-04-15 MED ORDER — FAMOTIDINE IN NACL 20-0.9 MG/50ML-% IV SOLN
20.0000 mg | Freq: Two times a day (BID) | INTRAVENOUS | Status: DC
  Administered 2018-04-15: 20 mg via INTRAVENOUS
  Filled 2018-04-15: qty 50

## 2018-04-15 MED ORDER — ACETAMINOPHEN 650 MG RE SUPP
650.0000 mg | RECTAL | Status: DC | PRN
  Administered 2018-04-15 (×2): 650 mg via RECTAL
  Filled 2018-04-15 (×2): qty 1

## 2018-04-15 NOTE — ED Notes (Signed)
Per Dr Rory Percy, continue to titrate cleviprex so diastolic BP below 90.

## 2018-04-15 NOTE — H&P (Signed)
Neurology Consultation  CC: Acute onset of slurred speech and left-sided weakness  History is obtained from: Patient, chart  HPI: Richard Vaughn is a 67 y.o. 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  He said he was in his normal state of health, he was taking the dog out and suddenly felt this numbness on his lips.  When he tried to speak, his speech was slurred.  He has had similar symptoms with his prior strokes, that alerted him to call his wife to call 911. 911 was called, EMTs arrived, there arrival, his blood pressure systolics were in the 578I, he had slurred speech, gait difficulty and disc herniation.  A code stroke was called and patient was brought into [facility]. I examined the patient on the ER bridge.  He was alert awake oriented x3 with moderate dysarthria and left upper and lower extremity drift and left hemisensory loss. He was taken in for a stat noncontrast CT of the head that revealed a right basal ganglia bleed.  No IVH.  Less than 30 cc.  ICH score 0.  LKW: 12:45 AM on 04/15/2018 tpa given?: no, ICH Premorbid modified Rankin scale (mRS): 0 ICH score-0 ROS:  ROS was performed and is negative except as noted in the HPI.  Past Medical History:  Diagnosis Date  . Barrett's esophagus   . Chronic back pain   . Gastritis    Mild  . Gastroparesis   . GERD (gastroesophageal reflux disease)   . Headache   . Hyperglycemia   . Nephrolithiasis   . Renal cyst   . Stricture and stenosis of esophagus   . Stroke ([place]) 07-2015  . Vision abnormalities    Family History  Problem Relation Age of Onset  . Hypertension Father   . Stroke Father   . Hypertension Mother   . Liver cancer Mother   . Hypertension Sister        2 other sisters has also  . Stroke Sister   . Stroke Sister   . Hypertension Brother        2nd brother has also  . Colon cancer Neg Hx   . Esophageal cancer Neg Hx   . Rectal cancer Neg Hx   . Stomach cancer Neg Hx   . Colon polyps Neg Hx    Social History:   reports that he has been smoking cigarettes.  He has a 7.50 pack-year smoking history. He has never used smokeless tobacco. He reports that he has current or past drug history. Drugs: Oxycodone and Morphine. He reports that he does not drink alcohol.  Medications No current facility-administered medications for this encounter.   Current Outpatient Medications:  .  aspirin 325 MG tablet, Take 1 tablet (325 mg total) by mouth daily., Disp: , Rfl:  .  NEXIUM 40 MG capsule, TAKE 1 CAPSULE DAILY AT 12 NOON., Disp: 90 capsule, Rfl: 3 .  olmesartan (BENICAR) 20 MG tablet, TAKE 1 TABLET BY MOUTH EVERY DAY, Disp: 30 tablet, Rfl: 0 .  oxymorphone (OPANA) 10 MG tablet, Take 10 mg by mouth every 6 (six) hours as needed for pain. , Disp: , Rfl:  .  sildenafil (VIAGRA) 100 MG tablet, Take one half to one tablet one hour prior to sexual activity., Disp:  6 tablet, Rfl: 6 .  traZODone (DESYREL) 50 MG tablet, TAKE 1 TABLET AT BEDTIME, Disp: 30 tablet, Rfl: 0  Exam: Current vital signs: Ht 6' 1.5" (1.867 m)   Wt 122 kg (268 lb 15.4 oz)   BMI 35.00 kg/m  Vital signs in last 24 hours: Weight:  [122 kg (268 lb 15.4 oz)] 122 kg (268 lb 15.4 oz) (06/01 0100) GENERAL: Awake, alert in NAD HEENT: - Normocephalic and atraumatic, dry mm, no LN++, no Thyromegally LUNGS - Clear to auscultation bilaterally with no wheezes CV - S1S2 RRR, no m/r/g, equal pulses bilaterally. ABDOMEN - Soft, nontender, nondistended with normoactive BS Ext: warm, well perfused, intact peripheral pulses, no edema  NEURO:  Mental Status: AA&Ox3  Language: speech is moderately dysarthric, but naming, repetition, fluency, and comprehension intact. Cranial Nerves: PERRL.  EOMI, visual fields full, no facial asymmetry, facial sensation intact, hearing intact, tongue/uvula/soft palate midline, normal sternocleidomastoid and trapezius muscle strength. No evidence of tongue atrophy or fibrillations Motor: Left upper extremity 4/5, left lower extremity 4-/5, right upper extremity and right lower extremity 5/5. Tone: is normal and bulk is normal Sensation-decreased light touch on all of left hemibody. Coordination: FTN intact bilaterally, no ataxia in BLE. Gait- deferred  NIHSS-1 for sensory, 1 for left arm, 1 for left leg, 2 for dysarthria = total 5   Labs I have reviewed labs in epic and the results pertinent to this consultation are: CBC    Component Value Date/Time   WBC 8.0 09/22/2016 1116   RBC 4.93 09/22/2016 1116   HGB 16.0 09/22/2016 1127   HCT 47.0 09/22/2016 1127   PLT 203 09/22/2016 1116   MCV 91.3 09/22/2016 1116   MCH 31.2 09/22/2016 1116   MCHC 34.2 09/22/2016 1116   RDW 13.0 09/22/2016 1116   LYMPHSABS 2.2 09/22/2016 1116   MONOABS 0.5 09/22/2016 1116   EOSABS 0.1 09/22/2016 1116   BASOSABS 0.0 09/22/2016 1116    CMP     Component Value Date/Time   NA 140 08/15/2017 0844   K 4.2 08/15/2017 0844   CL 107 08/15/2017 0844   CO2 24 08/15/2017 0844   GLUCOSE 98 08/15/2017 0844   GLUCOSE 105 (H) 11/01/2006 1056   BUN 11 08/15/2017 0844   CREATININE 1.06 08/15/2017 0844   CALCIUM 9.0 08/15/2017 0844   PROT 6.5 08/15/2017 0844   ALBUMIN 4.1 08/15/2017 0844   AST 13 08/15/2017 0844   ALT 18 08/15/2017 0844   ALKPHOS 70 08/15/2017 0844   BILITOT 0.5 08/15/2017 0844   GFRNONAA >60 09/22/2016 1116   GFRAA >60 09/22/2016 1116  Imaging I have reviewed the images obtained: CT-scan of the brain-right basal ganglia bleed, with no IVH, no midline shift.  Small bleed way less than 30 cc.  Also seen chronic white matter disease scattered in both hemispheres and sequela of prior left basal ganglia infarct.  Assessment:  67 year old man with  above said past medical history with sudden onset of left hemiparesis, dysarthria and left hemisensory loss. Noncontrast CT of the head shows a right basal ganglia bleed. Etiology most likely hypertensive Will need ICU admission for further neuro monitoring  Plan: Subcortical ICH, nontraumatic Acuity: Acute Laterality: right Current suspected etiology:  HTN Treatment: -Admit to NICU -ICH Score: 0 -ICH Volume: -BP control goal SYS<140 -PT/OT/ST  -neuromonitoring -repeat CTH in AM  CNS Cerebral edema Compression of brain -Close neuro monitoring, small bleed without IVH or MLS.  Dysarthria Dysphagia following ICH  -NPO until cleared by  speech -ST  Hemiplegia and hemiparesis following nontraumatic intracerebral hemorrhage affecting left non-dominant side  -Continue PT/OT/ST  RESP No active issues Monitor clinically Obtain a chest x-ray to rule out any aspiration  CV Essential (primary) hypertension -Aggressive BP control, goal SBP < 140 -Labetalol bolus bolus followed by Cleviprex -TTE  GI/GU -Gentle hydration -Check labs in the a.m.  HEME -No active issues, check CBC in the morning -No antiplatelets or anticoagulants  ENDO -check A1c -goal HgbA1c < 7  Fluid/Electrolyte Disorders -Check BMP and replete as necessary  Hypokalemia on arrival.  Replete potassium  ID Possible Aspiration PNA -CXR -NPO -Monitor  Nutrition E66.9 Obesity  -diet consult  Prophylaxis DVT: SCD.  Do not use antiplatelets and anticoagulants in the setting of an acute ICH GI: Pepcid Bowel: Doc/senna  Dispo: To be determined  Diet: NPO until cleared by speech  Code Status: Full Code   THE FOLLOWING WERE PRESENT ON ADMISSION: Intracerebral hemorrhage Cerebral edema Hemiparesis Dysarthria Possible aspiration pneumonia Hypokalemia  -- Amie Portland, MD [facility] Pager: [phone] If 7pm to 7am, please call on call as listed on AMION.   CRITICAL  CARE ATTESTATION This patient is critically ill and at significant risk of neurological worsening, death and care requires constant monitoring of vital signs, hemodynamics,respiratory and cardiac monitoring. I spent 50  minutes of neurocritical care time performing neurological assessment, discussion with family, other specialists and medical decision making of high complexityin the care of  this patient.

## 2018-04-15 NOTE — ED Provider Notes (Signed)
[facility] EMERGENCY DEPARTMENT Provider Note   CSN: [id number] Arrival date & time: 04/15/18  0123     History   Chief Complaint No chief complaint on file.   HPI Richard Vaughn is a 67 y.o. male.  Patient is brought to the emergency department for evaluation of possible stroke.  Patient had sudden onset of left facial numbness, slurred speech, left arm and left leg numbness and weakness 45 minutes ago.  Symptoms suddenly occurred when he went outside to smoke a cigarette.  He had talked to his wife just before the and he was at his normal baseline.  Patient reports a previous stroke.  He is not expensing any headache.  He does not take any blood thinner.     Past Medical History:  Diagnosis Date  . Barrett's esophagus   . Chronic back pain   . Gastritis    Mild  . Gastroparesis   . GERD (gastroesophageal reflux disease)   . Headache   . Hyperglycemia   . Nephrolithiasis   . Renal cyst   . Stricture and stenosis of esophagus   . Stroke ([place]) 07-2015  . Vision abnormalities     Patient Active Problem List   Diagnosis Date Noted  . ICH (intracerebral hemorrhage) ([place]) 04/15/2018  . Insomnia 07/13/2017  . PAD (peripheral artery disease) ([place]) 07/13/2017  . Hyperlipidemia with target LDL less than 70 07/13/2017  . Stroke-like symptom 09/22/2016  . Paresthesia 09/22/2016  . CVA (cerebral vascular accident) ([place]) 09/22/2016  . Cerebrovascular accident (CVA) due to thrombosis of left carotid artery ([place]) 09/23/2015  . Primary snoring 09/23/2015  . Hypersomnia with sleep apnea 09/23/2015  . Obstructive sleep apnea 09/23/2015  . Lacunar infarct, acute ([place]) 08/25/2015  . Sleep apnea 08/25/2015  . Dysarthria   . CVA (cerebral infarction) 08/02/2015  . Acute hyperglycemia 08/02/2015  . HTN (hypertension) 12/27/2014  . Epistaxis 07/28/2012  . HYPERGLYCEMIA 10/02/2010  . NEPHROLITHIASIS 05/23/2009  . BARRETTS ESOPHAGUS 02/20/2009  . Gastroparesis  02/20/2009  . RADICULOPATHY 10/21/2008  . GERD 10/08/2008  . ESOPHAGITIS 08/15/2008  . ESOPHAGEAL STRICTURE 08/15/2008  . GASTRITIS, ACUTE 08/15/2008  . RENAL CYST 08/14/2008  . TOBACCO ABUSE 08/08/2008  . HEMATURIA, MICROSCOPIC, HX OF 08/08/2008  . PULMONARY NODULE 01/10/2008    Past Surgical History:  Procedure Laterality Date  . [place] SURGERY  2012  . COLONOSCOPY    . [place] SURGERY  2005, 2010   replaced L4 and L5  . UPPER GASTROINTESTINAL ENDOSCOPY          Home Medications    Prior to Admission medications   Medication Sig Start Date End Date Taking? Authorizing Provider  aspirin 325 MG tablet Take 1 tablet (325 mg total) by mouth daily. 08/04/15   Verlee Monte, MD  NEXIUM 40 MG capsule TAKE 1 CAPSULE DAILY AT 12 NOON. 12/29/17   Armbruster, Carlota Raspberry, MD  olmesartan (BENICAR) 20 MG tablet TAKE 1 TABLET BY MOUTH EVERY DAY 04/14/18   Martinique, [name], MD  oxymorphone (OPANA) 10 MG tablet Take 10 mg by mouth every 6 (six) hours as needed for pain.     [provider]  sildenafil (VIAGRA) 100 MG tablet Take one half to one tablet one hour prior to sexual activity. 02/06/16   Eulas Post, MD  traZODone (DESYREL) 50 MG tablet TAKE 1 TABLET AT BEDTIME 03/28/18   [place]ique, [name], MD    Family History Family History  Problem Relation Age  of Onset  . Hypertension Father   . Stroke Father   . Hypertension Mother   . Liver cancer Mother   . Hypertension Sister        2 other sisters has also  . Stroke Sister   . Stroke Sister   . Hypertension Brother        2nd brother has also  . Colon cancer Neg Hx   . Esophageal cancer Neg Hx   . Rectal cancer Neg Hx   . Stomach cancer Neg Hx   . Colon polyps Neg Hx     Social History Social History   Tobacco Use  . Smoking status: Current Some Day Smoker    Packs/day: 0.25    Years: 30.00    Pack years: 7.50    Types: Cigarettes    Last attempt to quit: 08/02/2015    Years since quitting: 2.7  .  Smokeless tobacco: Never Used  . Tobacco comment: Counseling sheet given 3/15  Substance Use Topics  . Alcohol use: No    Alcohol/week: 0.0 oz  . Drug use: Yes    Types: Oxycodone, Morphine    Comment: Opana daily for chronic  back pain     Allergies   Patient has no known allergies.   Review of Systems Review of Systems  Neurological: Positive for speech difficulty (Slurred), weakness (Left side) and numbness (Left side).  All other systems reviewed and are negative.    Physical Exam Updated Vital Signs BP (!) 141/98 (BP Location: Left Arm)   Pulse 84   Temp 98.7 F (37.1 C) (Oral)   Resp 17   Ht 6' 1.5" (1.867 m)   Wt 122 kg (268 lb 15.4 oz)   SpO2 95%   BMI 35.00 kg/m   Physical Exam  Constitutional: He is oriented to person, place, and time. He appears well-developed and well-nourished. No distress.  HENT:  Head: Normocephalic and atraumatic.  Right Ear: Hearing normal.  Left Ear: Hearing normal.  Nose: Nose normal.  Mouth/Throat: Oropharynx is clear and moist and mucous membranes are normal.  Eyes: Pupils are equal, round, and reactive to light. Conjunctivae and EOM are normal.  Neck: Normal range of motion. Neck supple.  Cardiovascular: Regular rhythm, S1 normal and S2 normal. Exam reveals no gallop and no friction rub.  No murmur heard. Pulmonary/Chest: Effort normal and breath sounds normal. No respiratory distress. He exhibits no tenderness.  Abdominal: Soft. Normal appearance and bowel sounds are normal. There is no hepatosplenomegaly. There is no tenderness. There is no rebound, no guarding, no tenderness at McBurney's point and negative Murphy's sign. No hernia.  Musculoskeletal: Normal range of motion.  Neurological: He is alert and oriented to person, place, and time. A cranial nerve deficit (Left facial) is present. No sensory deficit (Left hemiparesis). He exhibits abnormal muscle tone (Left hemiparesis). Coordination normal. GCS eye subscore is 4.  GCS verbal subscore is 5. GCS motor subscore is 6.  Left upper extremity and left lower extremity strength 3+ out of 5 with ataxia and drift  Skin: Skin is warm, dry and intact. No rash noted. No cyanosis.  Psychiatric: He has a normal mood and affect. His speech is normal and behavior is normal. Thought content normal.  Nursing note and vitals reviewed.    ED Treatments / Results  Labs (all labs ordered are listed, but only abnormal results are displayed) Labs Reviewed  I-STAT CHEM 8, ED - Abnormal; Notable for the following components:  Result Value   Potassium 3.4 (*)    Glucose, Bld 106 (*)    Calcium, Ion 1.06 (*)    All other components within normal limits  CBG MONITORING, ED - Abnormal; Notable for the following components:   Glucose-Capillary 108 (*)    All other components within normal limits  CBC  DIFFERENTIAL  ETHANOL  PROTIME-INR  APTT  COMPREHENSIVE METABOLIC PANEL  RAPID URINE DRUG SCREEN, HOSP PERFORMED  URINALYSIS, ROUTINE W REFLEX MICROSCOPIC  HIV ANTIBODY (ROUTINE TESTING)  LIPID PANEL  HEMOGLOBIN A1C  URINALYSIS, COMPLETE (UACMP) WITH MICROSCOPIC  I-STAT TROPONIN, ED    EKG None  Radiology No results found.  Procedures Procedures (including critical care time)  Medications Ordered in ED Medications   stroke: mapping our early stages of recovery book (has no administration in time range)  acetaminophen (TYLENOL) tablet 650 mg (has no administration in time range)    Or  acetaminophen (TYLENOL) solution 650 mg (has no administration in time range)    Or  acetaminophen (TYLENOL) suppository 650 mg (has no administration in time range)  senna-docusate (Senokot-S) tablet 1 tablet (has no administration in time range)  famotidine (PEPCID) IVPB 20 mg premix (has no administration in time range)  labetalol (NORMODYNE,TRANDATE) injection 20 mg (has no administration in time range)    And  clevidipine (CLEVIPREX) infusion 0.5 mg/mL (1 mg/hr  Intravenous New Bag/Given 04/15/18 0141)     Initial Impression / Assessment and Plan / ED Course  I have reviewed the triage vital signs and the nursing notes.  Pertinent labs & imaging results that were available during my care of the patient were reviewed by me and considered in my medical decision making (see chart for details).     Presents to the emergency department with sudden onset of left-sided hemiparesis, dysarthria.  Patient with previous stroke and hypertension.  Patient brought to the ER as a code stroke.  Evaluated in conjunction with Dr. Carloyn Jaeger at arrival.  Patient went for scanning CT which shows right basal ganglia hemorrhage.  Patient will be admitted to neuro ICU by neurology.  CRITICAL CARE Performed by: Orpah Greek   Total critical care time: 30 minutes  Critical care time was exclusive of separately billable procedures and treating other patients.  Critical care was necessary to treat or prevent imminent or life-threatening deterioration.  Critical care was time spent personally by me on the following activities: development of treatment plan with patient and/or surrogate as well as nursing, discussions with consultants, evaluation of patient's response to treatment, examination of patient, obtaining history from patient or surrogate, ordering and performing treatments and interventions, ordering and review of laboratory studies, ordering and review of radiographic studies, pulse oximetry and re-evaluation of patient's condition.   Final Clinical Impressions(s) / ED Diagnoses   Final diagnoses:  Right-sided nontraumatic intracerebral hemorrhage, unspecified cerebral location [facility])    ED Discharge Orders    None       Betsey Holiday Gwenyth Allegra, MD 04/15/18 0147

## 2018-04-15 NOTE — Progress Notes (Signed)
STROKE TEAM PROGRESS NOTE   SUBJECTIVE (INTERVAL HISTORY) His wife, sister and daughter are at the bedside.  Overall he feels his condition is stable. Family reported continued smoking. On cleviprex IV, high dose. BP stable at 140s. Left UE mild weakness with left facial droop. CT repeat pending. Claustrophobic and would like no MRI.    OBJECTIVE Temp:  [98.4 F (36.9 C)-98.7 F (37.1 C)] 98.4 F (36.9 C) (06/01 0400) Pulse Rate:  [69-120] 69 (06/01 0925) Resp:  [10-28] 10 (06/01 0925) BP: (100-171)/(62-101) 120/86 (06/01 0925) SpO2:  [88 %-97 %] 96 % (06/01 0925) Weight:  [268 lb 15.4 oz (122 kg)] 268 lb 15.4 oz (122 kg) (06/01 0100)  Recent Labs  Lab 04/15/18 0127  GLUCAP 108*   Recent Labs  Lab 04/15/18 0127 04/15/18 0134  NA 140 140  K 3.5 3.4*  CL 108 104  CO2 23  --   GLUCOSE 109* 106*  BUN 8 7  CREATININE 1.26* 1.10  CALCIUM 8.7*  --    Recent Labs  Lab 04/15/18 0127  AST 17  ALT 20  ALKPHOS 77  BILITOT 0.7  PROT 7.0  ALBUMIN 4.0   Recent Labs  Lab 04/15/18 0127 04/15/18 0134  WBC 8.8  --   NEUTROABS 4.7  --   HGB 14.7 14.6  HCT 43.8 43.0  MCV 90.3  --   PLT 165  --    No results for input(s): CKTOTAL, CKMB, CKMBINDEX, TROPONINI in the last 168 hours. Recent Labs    04/15/18 0127  LABPROT 13.7  INR 1.06   No results for input(s): COLORURINE, LABSPEC, PHURINE, GLUCOSEU, HGBUR, BILIRUBINUR, KETONESUR, PROTEINUR, UROBILINOGEN, NITRITE, LEUKOCYTESUR in the last 72 hours.  Invalid input(s): APPERANCEUR     Component Value Date/Time   CHOL 139 08/15/2017 0844   TRIG 90.0 08/15/2017 0844   TRIG 121 11/01/2006 1056   HDL 31.10 (L) 08/15/2017 0844   CHOLHDL 4 08/15/2017 0844   VLDL 18.0 08/15/2017 0844   LDLCALC 90 08/15/2017 0844   Lab Results  Component Value Date   HGBA1C 5.5 08/15/2017      Component Value Date/Time   LABOPIA NONE DETECTED 09/22/2016 1415   COCAINSCRNUR NONE DETECTED 09/22/2016 1415   LABBENZ NONE DETECTED  09/22/2016 1415   AMPHETMU NONE DETECTED 09/22/2016 1415   THCU NONE DETECTED 09/22/2016 1415   LABBARB NONE DETECTED 09/22/2016 1415    Recent Labs  Lab 04/15/18 0127  ETH <10    I have personally reviewed the radiological images below and agree with the radiology interpretations.  Ct Head Code Stroke Wo Contrast  Result Date: 04/15/2018 CLINICAL DATA:  Code stroke. 67 y/o M; slurred speech, left-sided weakness. EXAM: CT HEAD WITHOUT CONTRAST TECHNIQUE: Contiguous axial images were obtained from the base of the skull through the vertex without intravenous contrast. COMPARISON:  09/22/2016 MRI and CT head.  09/23/2016 CTA of head. FINDINGS: Brain: Acute hemorrhage within the right thalamus measuring 1.5 x 1.2 x 1.4 cm (volume = 1.3 cm^3) (AP x ML x CC series 3, image 16 and series 5, image 37). Mild surrounding edema with local mass effect. Stable chronic lacunar infarcts within the left lentiform nucleus extending into corona radiata. Stable advanced chronic microvascular ischemic changes of white matter. Stable mild brain parenchymal volume loss. No evidence for large acute infarct, hydrocephalus, or herniation. Vascular: Calcific atherosclerosis of carotid siphons. No hyperdense vessel. Skull: Normal. Negative for fracture or focal lesion. Sinuses/Orbits: Mild mucosal thickening of the ethmoid air cells  and mucosal thickening of the right maxillary sinus. Partial opacification of left mastoid air cells. Orbits are unremarkable. Other: None. IMPRESSION: 1. Acute hemorrhage within the right thalamus measuring up to 1.5 cm, 1.3 cc. Mild associated edema and mass effect. 2. Stable advanced chronic microvascular ischemic changes of the brain and mild brain parenchymal volume loss. 3. ASPECTS is not applicable. Critical Value/emergent results were called by telephone at the time of interpretation on 04/15/2018 at 1:46 am to Dr. Rory Percy, who verbally acknowledged these results. Electronically Signed   By:  Kristine Garbe M.D.   On: 04/15/2018 01:53    Carotid Doppler  pending  TTE pending   PHYSICAL EXAM  Temp:  [98.4 F (36.9 C)-98.7 F (37.1 C)] 98.4 F (36.9 C) (06/01 0400) Pulse Rate:  [69-120] 69 (06/01 0925) Resp:  [10-28] 10 (06/01 0925) BP: (100-171)/(62-101) 120/86 (06/01 0925) SpO2:  [88 %-97 %] 96 % (06/01 0925) Weight:  [268 lb 15.4 oz (122 kg)] 268 lb 15.4 oz (122 kg) (06/01 0100)  General - Well nourished, well developed, in no apparent distress.  Ophthalmologic - fundi not visualized due to noncooperation.  Cardiovascular - Regular rate and rhythm.  Mental Status -  Level of arousal and orientation to time, place, and person were intact. Language including expression, naming, repetition, comprehension was assessed and found intact. Fund of Knowledge was assessed and was intact.  Cranial Nerves II - XII - II - Visual field intact OU. III, IV, VI - Extraocular movements intact. V - Facial sensation decreased on the left, 40-50% of the right. VII - left facial droop. VIII - Hearing & vestibular intact bilaterally. X - Palate elevates symmetrically, mild dysarthria. XI - Chin turning & shoulder shrug intact bilaterally. XII - Tongue protrusion intact.  Motor Strength - The patient's strength was normal in all extremities except LUE 4+/5 bicep and tricep with pronator drift.  Bulk was normal and fasciculations were absent.   Motor Tone - Muscle tone was assessed at the neck and appendages and was normal.  Reflexes - The patient's reflexes were symmetrical in all extremities and he had no pathological reflexes.  Sensory - Light touch, temperature/pinprick were assessed and were decreased on the left, 40-50% of the right.    Coordination - The patient had normal movements in the hands with no ataxia or dysmetria.  Tremor was absent.  Gait and Station - deferred.   ASSESSMENT/PLAN Richard Vaughn is a 67 y.o. male with history of HTN, HLD,  stroke, LBP admitted for left sided weakness and numbness, slurry speech. No tPA given due to [place].    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  Resultant left facial droop and left UE mild weakness  CT head right IC/thalamic ICH  CT repeat pending  Carotid Doppler  pending  2D Echo  pending  LDL pending  HgbA1c pending  UDS pending  SCDs for VTE prophylaxis  aspirin 325 mg daily prior to admission, now on No antithrombotic due to [place]  Patient counseled to be compliant with his antithrombotic medications  Ongoing aggressive stroke risk factor management  Therapy recommendations:  pending  Disposition:  Pending  Hx of stroke  Left BG/CR infarct in the past - no residue  09/2016 right facial tingling, MRI no acute infarct but old BG/CR infarct, CTA head and neck neg, EF 55-60%, LDL 86 and A1C 5.4. Considered recrudescence of old stroke, on ASA and lipitor  Hypertension Unstable BP goal <  140 On high dose cleviprex Consider po BP meds once pass swallow  Hyperlipidemia  Home meds:  none   LDL pending, goal < 70  Continue statin at discharge  Tobacco abuse  Current smoker  Smoking cessation counseling provided  Pt is willing to quit  Other Stroke Risk Factors  Advanced age  Obesity, Body mass index is 35 kg/m.   Other Active Problems  Elevated Cre - 1.26->1.10  Hospital day # 0  This patient is critically ill due to [place], hypertensive emergency and at significant risk of neurological worsening, death form rebleeding, stroke, heart failure, cerebral edema. This patient's care requires constant monitoring of vital signs, hemodynamics, respiratory and cardiac monitoring, review of multiple databases, neurological assessment, discussion with family, other specialists and medical decision making of high complexity. I spent 40 minutes of neurocritical care time in the care of this patient. I had long discussion with pt and wife and  sister at bedside, updated pt current condition, treatment plan and potential prognosis. They expressed understanding and appreciation.   Rosalin Hawking, MD PhD Stroke Neurology 04/15/2018 9:43 AM    To contact Stroke Continuity provider, please refer to [email or web address] After hours, contact General Neurology

## 2018-04-15 NOTE — ED Triage Notes (Addendum)
Patient is from home, was walking the dog at 0045 and started having numbness in his lips, slurred speech, with an abnormal gait.  Patient having posterior HA and leftsided weakness and abnormal gait.  He is a smoker.  CBG of 108, 154/98, 96, 95%RA.

## 2018-04-15 NOTE — Evaluation (Signed)
Physical Therapy Evaluation Patient Details Name: RAJ LANDRESS MRN: [id number] DOB: [birth year] Today's Date: 04/15/2018   History of Present Illness  [name] is a 67 y.o. male with history of HTN, HLD, stroke, LBP admitted for left sided weakness and numbness, slurry speech. No tPA given due to [place]. CT showed right IC/thalamic ICH.  Clinical Impression  Pt admitted with/for s/s of stroke with left sided weakness.  Pt needing min assist for gait and min guard otherwise.  Pt currently limited functionally due to the problems listed below.  (see problems list.)  Pt will benefit from PT to maximize function and safety to be able to get home safely with available assist.     Follow Up Recommendations Outpatient PT    Equipment Recommendations  None recommended by PT    Recommendations for Other Services       Precautions / Restrictions Precautions Precautions: Fall      Mobility  Bed Mobility Overal bed mobility: Needs Assistance Bed Mobility: Supine to Sit     Supine to sit: Min guard     General bed mobility comments: mildly uncoordinated left side, but overall fluid and functional  Transfers Overall transfer level: Needs assistance Equipment used: None Transfers: Sit to/from Stand Sit to Stand: Min guard         General transfer comment: use of hand, but otherwise steady with stand  Ambulation/Gait Ambulation/Gait assistance: Min assist Ambulation Distance (Feet): 120 Feet Assistive device: None Gait Pattern/deviations: Step-through pattern Gait velocity: slower Gait velocity interpretation: <1.8 ft/sec, indicate of risk for recurrent falls General Gait Details: paretic gait on the Left with minimal w/shift assist given through the shoulder initially, then as he fatigued needed additional assist through the trunk.  Without assist, pt unable to swing left LE through.  Stairs            Wheelchair Mobility    Modified Rankin (Stroke Patients  Only) Modified Rankin (Stroke Patients Only) Pre-Morbid Rankin Score: No significant disability Modified Rankin: Moderately severe disability     Balance Overall balance assessment: Needs assistance Sitting-balance support: No upper extremity supported Sitting balance-Leahy Scale: Good Sitting balance - Comments: acceps moderate challenge with good righting reaction   Standing balance support: No upper extremity supported Standing balance-Leahy Scale: Fair                               Pertinent Vitals/Pain Pain Assessment: No/denies pain Pain Score: 2  Pain Location: headache    Home Living Family/patient expects to be discharged to:: Private residence Living Arrangements: Spouse/significant other(2 grand children) Available Help at Discharge: Family;Available 24 hours/day Type of Home: House Home Access: Stairs to enter Entrance Stairs-Rails: Psychiatric nurse of Steps: 2 Home Layout: Two level;Able to live on main level with bedroom/bathroom Home Equipment: Gilford Rile - 2 wheels;Bedside commode;Shower seat      Prior Function Level of Independence: Independent               Hand Dominance   Dominant Hand: Right    Extremity/Trunk Assessment        Lower Extremity Assessment Lower Extremity Assessment: LLE deficits/detail;RLE deficits/detail RLE Deficits / Details: WFL LLE Deficits / Details: grossly >=4/5 overall LLE Sensation: decreased light touch LLE Coordination: decreased fine motor       Communication   Communication: (dysarthric, but intelligible)  Cognition Arousal/Alertness: Awake/alert Behavior During Therapy: WFL for tasks assessed/performed Overall Cognitive  Status: Impaired/Different from baseline(per SLP not test formally)                                        General Comments General comments (skin integrity, edema, etc.): VSS    Exercises     Assessment/Plan    PT Assessment  Patient needs continued PT services  PT Problem List Decreased strength;Decreased activity tolerance;Decreased balance;Decreased mobility;Decreased coordination;Impaired sensation       PT Treatment Interventions Gait training;Stair training;Functional mobility training;Therapeutic activities;Balance training;Patient/family education;Neuromuscular re-education;DME instruction    PT Goals (Current goals can be found in the Care Plan section)  Acute Rehab PT Goals Patient Stated Goal: back to doing everything I want to do. PT Goal Formulation: With patient Time For Goal Achievement: 04/29/18 Potential to Achieve Goals: Good    Frequency Min 4X/week   Barriers to discharge        Co-evaluation               AM-PAC PT "6 Clicks" Daily Activity  Outcome Measure Difficulty turning over in bed (including adjusting bedclothes, sheets and blankets)?: None Difficulty moving from lying on back to sitting on the side of the bed? : None Difficulty sitting down on and standing up from a chair with arms (e.g., wheelchair, bedside commode, etc,.)?: None Help needed moving to and from a bed to chair (including a wheelchair)?: A Little Help needed walking in hospital room?: A Little Help needed climbing 3-5 steps with a railing? : A Little 6 Click Score: 21    End of Session   Activity Tolerance: Patient tolerated treatment well Patient left: in chair;with call bell/phone within reach;with nursing/sitter in room Nurse Communication: Mobility status PT Visit Diagnosis: Unsteadiness on feet (R26.81);Hemiplegia and hemiparesis Hemiplegia - Right/Left: Left Hemiplegia - dominant/non-dominant: Non-dominant Hemiplegia - caused by: Nontraumatic intracerebral hemorrhage    Time: 5397-6734 PT Time Calculation (min) (ACUTE ONLY): 26 min   Charges:   PT Evaluation $PT Eval Moderate Complexity: 1 Mod PT Treatments $Gait Training: 8-22 mins   PT G Codes:        04-19-2018  Donnella Sham, PT [phone]  (pager)  Tessie Fass Jamiah Recore 04/19/2018, 4:48 PM

## 2018-04-15 NOTE — Evaluation (Signed)
Clinical/Bedside Swallow Evaluation Patient Details  Name: Richard Vaughn MRN: [id number] Date of Birth: 1950/12/01  Today's Date: 04/15/2018 Time: SLP Start Time (ACUTE ONLY): 1330 SLP Stop Time (ACUTE ONLY): 1345 SLP Time Calculation (min) (ACUTE ONLY): 15 min  Past Medical History:  Past Medical History:  Diagnosis Date  . Barrett's esophagus   . Chronic back pain   . Gastritis    Mild  . Gastroparesis   . GERD (gastroesophageal reflux disease)   . Headache   . Hyperglycemia   . Nephrolithiasis   . Renal cyst   . Stricture and stenosis of esophagus   . Stroke ([place]) 07-2015  . Vision abnormalities    Past Surgical History:  Past Surgical History:  Procedure Laterality Date  . [place] SURGERY  2012  . COLONOSCOPY    . [place] SURGERY  2005, 2010   replaced L4 and L5  . UPPER GASTROINTESTINAL ENDOSCOPY     HPI:  [name] is a 67 y.o. male with history of HTN, HLD, stroke, LBP admitted for left sided weakness and numbness, slurry speech. No tPA given due to [place]. CT showed right IC/thalamic ICH. Pt seen by SLP following CVA in 2016 and received OP SLP for dysarthria.    Assessment / Plan / Recommendation Clinical Impression   Patient presents with mild CN V, VII deficits which primarily impact mastication. There is mild anterior escape x1 with thin liquids via straw. No overt signs of aspiration with 3 oz water swallow challenge or with any consistency trialed. Noted slight hypernasality; palate elevates symmetrically. Mild diffuse residue with regular solid cracker; pt reports difficulty chewing due to impaired sensation. Recommend dys 3, thin liquids, meds whole with liquids. Will follow for tolerance, advancement of solids.   SLP Visit Diagnosis: Dysphagia, unspecified (R13.10)    Aspiration Risk  Mild aspiration risk    Diet Recommendation Dysphagia 3 (Mech soft);Thin liquid   Liquid Administration via: Cup;Straw Medication Administration: Whole  meds with liquid Supervision: Patient able to self feed Compensations: Slow rate;Small sips/bites;Minimize environmental distractions;Monitor for anterior loss    Other  Recommendations Oral Care Recommendations: Oral care BID   Follow up Recommendations Other (comment)(tbd)      Frequency and Duration min 2x/week  2 weeks       Prognosis Prognosis for Safe Diet Advancement: Good      Swallow Study   General Date of Onset: 04/15/18 HPI: [name] is a 67 y.o. male with history of HTN, HLD, stroke, LBP admitted for left sided weakness and numbness, slurry speech. No tPA given due to [place]. CT showed right IC/thalamic ICH. Pt seen by SLP following CVA in 2016 and received OP SLP for dysarthria.  Type of Study: Bedside Swallow Evaluation Previous Swallow Assessment: none on file; failed stroke swallow screen Diet Prior to this Study: NPO Temperature Spikes Noted: No Respiratory Status: Room air History of Recent Intubation: No Behavior/Cognition: Alert;Cooperative Oral Cavity Assessment: Within Functional Limits Oral Care Completed by SLP: Recent completion by staff Oral Cavity - Dentition: Adequate natural dentition Vision: Functional for self-feeding Self-Feeding Abilities: Able to feed self Patient Positioning: Upright in bed Baseline Vocal Quality: Normal Volitional Cough: Strong Volitional Swallow: Able to elicit    Oral/Motor/Sensory Function Overall Oral Motor/Sensory Function: Mild impairment Facial ROM: Reduced left;Suspected CN VII (facial) dysfunction Facial Symmetry: Abnormal symmetry left;Suspected CN VII (facial) dysfunction Facial Sensation: Reduced left;Suspected CN V (Trigeminal) dysfunction Lingual ROM: Within Functional Limits Lingual Symmetry: Within  Functional Limits Lingual Strength: Within Functional Limits Lingual Sensation: Reduced(pt reports reduced L) Velum: Within Functional Limits(mildly hypernasal resonance) Mandible: Impaired(quivers  with opening)   Ice Chips Ice chips: Within functional limits   Thin Liquid Thin Liquid: Impaired Oral Phase Impairments: Reduced labial seal Oral Phase Functional Implications: Right anterior spillage(anterior escape with straw)    Nectar Thick Nectar Thick Liquid: Not tested   Honey Thick Honey Thick Liquid: Not tested   Puree Puree: Within functional limits Presentation: Self Fed;Spoon   Solid   GO   Solid: Impaired Oral Phase Impairments: Impaired mastication Oral Phase Functional Implications: Oral residue(mild diffuse residue)       Deneise Lever, Rafael Hernandez, CCC-SLP Speech-Language Pathologist [place] 04/15/2018,2:07 PM

## 2018-04-15 NOTE — Evaluation (Signed)
Speech Language Pathology Evaluation Patient Details Name: Richard Vaughn MRN: [id number] DOB: [birth year] Today's Date: 04/15/2018 Time: 4431-5400 SLP Time Calculation (min) (ACUTE ONLY): 16 min  Problem List:  Patient Active Problem List   Diagnosis Date Noted  . ICH (intracerebral hemorrhage) ([place]) 04/15/2018  . Insomnia 07/13/2017  . PAD (peripheral artery disease) ([place]) 07/13/2017  . Hyperlipidemia with target LDL less than 70 07/13/2017  . Stroke-like symptom 09/22/2016  . Paresthesia 09/22/2016  . CVA (cerebral vascular accident) ([place]) 09/22/2016  . Cerebrovascular accident (CVA) due to thrombosis of left carotid artery ([place]) 09/23/2015  . Primary snoring 09/23/2015  . Hypersomnia with sleep apnea 09/23/2015  . Obstructive sleep apnea 09/23/2015  . Lacunar infarct, acute ([place]) 08/25/2015  . Sleep apnea 08/25/2015  . Dysarthria   . CVA (cerebral infarction) 08/02/2015  . Acute hyperglycemia 08/02/2015  . HTN (hypertension) 12/27/2014  . Epistaxis 07/28/2012  . HYPERGLYCEMIA 10/02/2010  . NEPHROLITHIASIS 05/23/2009  . BARRETTS ESOPHAGUS 02/20/2009  . Gastroparesis 02/20/2009  . RADICULOPATHY 10/21/2008  . GERD 10/08/2008  . ESOPHAGITIS 08/15/2008  . ESOPHAGEAL STRICTURE 08/15/2008  . GASTRITIS, ACUTE 08/15/2008  . RENAL CYST 08/14/2008  . TOBACCO ABUSE 08/08/2008  . HEMATURIA, MICROSCOPIC, HX OF 08/08/2008  . PULMONARY NODULE 01/10/2008   Past Medical History:  Past Medical History:  Diagnosis Date  . Barrett's esophagus   . Chronic back pain   . Gastritis    Mild  . Gastroparesis   . GERD (gastroesophageal reflux disease)   . Headache   . Hyperglycemia   . Nephrolithiasis   . Renal cyst   . Stricture and stenosis of esophagus   . Stroke ([place]) 07-2015  . Vision abnormalities    Past Surgical History:  Past Surgical History:  Procedure Laterality Date  . [place] SURGERY  2012  . COLONOSCOPY    . [place] SURGERY  2005, 2010   replaced L4 and  L5  . UPPER GASTROINTESTINAL ENDOSCOPY     HPI:  Richard Vaughn is a 67 y.o. male with history of HTN, HLD, stroke, LBP admitted for left sided weakness and numbness, slurry speech. No tPA given due to [place]. CT showed right IC/thalamic ICH. Pt seen by SLP following CVA in 2016 and received OP SLP for dysarthria.    Assessment / Plan / Recommendation Clinical Impression   Patient presents with mild-moderate dysarthria, with intelligibility impacted at phrase, sentence, conversation level. Rate of speech is slow, as are alternating motion rates. Mild hypernasality noted. Pt also has mild cognitive impairment, with primary deficit in selective attention. Pt has good awareness of deficits. He scored 22/40 on [place], consistent with mild impairment. Pt's attention to complex instructions vs impaired sequencing, organization judged to impact his performance in trailmaking task. He would benefit from skilled ST to address cognitive deficits and dysarthria in order to increase independence and improve intelligibility. Will follow acutely.     SLP Assessment  SLP Recommendation/Assessment: Patient needs continued Speech Lanaguage Pathology Services SLP Visit Diagnosis: Dysarthria and anarthria (R47.1);Cognitive communication deficit (R41.841)    Follow Up Recommendations  Other (comment)(tbd)    Frequency and Duration min 2x/week  2 weeks      SLP Evaluation Cognition  Overall Cognitive Status: Impaired/Different from baseline Orientation Level: Oriented X4 Attention: Focused;Sustained;Selective Focused Attention: Appears intact Sustained Attention: Appears intact Selective Attention: Impaired Selective Attention Impairment: Verbal complex;Functional basic Memory: Appears intact Awareness: Appears intact Problem Solving: Appears intact Executive Function: Reasoning Reasoning: Appears intact Behaviors: Lability(pt  tearful as SLP documented outside room) Safety/Judgment: Appears  intact       Comprehension  Auditory Comprehension Overall Auditory Comprehension: Appears within functional limits for tasks assessed Yes/No Questions: Within Functional Limits Commands: Impaired One Step Basic Commands: 75-100% accurate(100%) Two Step Basic Commands: 75-100% accurate(100%) Multistep Basic Commands: 75-100% accurate(100%) Complex Commands: 75-100% accurate(75%) Conversation: Simple Interfering Components: Attention Visual Recognition/Discrimination Discrimination: Within Function Limits Reading Comprehension Reading Status: Not tested    Expression Expression Primary Mode of Expression: Verbal Verbal Expression Overall Verbal Expression: Appears within functional limits for tasks assessed Written Expression Dominant Hand: Right Written Expression: Not tested   Oral / Motor  Oral Motor/Sensory Function Overall Oral Motor/Sensory Function: Mild impairment Facial ROM: Reduced left;Suspected CN VII (facial) dysfunction Facial Symmetry: Abnormal symmetry left;Suspected CN VII (facial) dysfunction Facial Sensation: Reduced left;Suspected CN V (Trigeminal) dysfunction Lingual ROM: Within Functional Limits Lingual Symmetry: Within Functional Limits Lingual Strength: Within Functional Limits Lingual Sensation: Reduced Velum: Within Functional Limits(mildly hypernasal resonance) Mandible: Impaired(quivers with opening) Motor Speech Overall Motor Speech: Impaired Respiration: Within functional limits Phonation: Normal Resonance: Hypernasality(mild) Articulation: Impaired Level of Impairment: Word Intelligibility: Intelligibility reduced Word: 75-100% accurate Phrase: 75-100% accurate(90%) Sentence: 75-100% accurate(85%) Conversation: 75-100% accurate(80%) Motor Planning: Witnin functional limits Effective Techniques: Slow rate;Increased vocal intensity   GO                   Deneise Lever, [place], CCC-SLP Speech-Language Pathologist [phone]  Aliene Altes 04/15/2018, 2:23 PM

## 2018-04-15 NOTE — Progress Notes (Signed)
Foley temperature erratic. Oral and axillary temperature WNL.

## 2018-04-16 ENCOUNTER — Inpatient Hospital Stay (HOSPITAL_COMMUNITY): Payer: Federal, State, Local not specified - PPO

## 2018-04-16 DIAGNOSIS — I6930 Unspecified sequelae of cerebral infarction: Secondary | ICD-10-CM | POA: Diagnosis not present

## 2018-04-16 DIAGNOSIS — E785 Hyperlipidemia, unspecified: Secondary | ICD-10-CM | POA: Diagnosis not present

## 2018-04-16 DIAGNOSIS — I161 Hypertensive emergency: Secondary | ICD-10-CM

## 2018-04-16 DIAGNOSIS — F1210 Cannabis abuse, uncomplicated: Secondary | ICD-10-CM | POA: Diagnosis not present

## 2018-04-16 DIAGNOSIS — I10 Essential (primary) hypertension: Secondary | ICD-10-CM | POA: Diagnosis not present

## 2018-04-16 DIAGNOSIS — F17200 Nicotine dependence, unspecified, uncomplicated: Secondary | ICD-10-CM | POA: Diagnosis not present

## 2018-04-16 DIAGNOSIS — I610 Nontraumatic intracerebral hemorrhage in hemisphere, subcortical: Secondary | ICD-10-CM | POA: Diagnosis not present

## 2018-04-16 LAB — HIV ANTIBODY (ROUTINE TESTING W REFLEX): HIV Screen 4th Generation wRfx: NONREACTIVE

## 2018-04-16 LAB — ECHOCARDIOGRAM COMPLETE
Height: 73.5 in
Weight: 4303.38 oz

## 2018-04-16 MED ORDER — HYDRALAZINE HCL 20 MG/ML IJ SOLN: 5.0000 mg | INTRAMUSCULAR | Status: DC | PRN

## 2018-04-16 MED ORDER — TRAZODONE HCL 50 MG PO TABS
50.0000 mg | ORAL_TABLET | Freq: Every day | ORAL | Status: DC
  Administered 2018-04-16: 50 mg via ORAL
  Filled 2018-04-16: qty 1

## 2018-04-16 MED ORDER — MORPHINE SULFATE ER 15 MG PO TBCR
30.0000 mg | EXTENDED_RELEASE_TABLET | Freq: Four times a day (QID) | ORAL | Status: DC | PRN
  Administered 2018-04-16 – 2018-04-17 (×3): 30 mg via ORAL
  Filled 2018-04-16: qty 2
  Filled 2018-04-16 (×2): qty 1

## 2018-04-16 MED ORDER — OXYMORPHONE HCL 10 MG PO TABS: 10.0000 mg | ORAL_TABLET | Freq: Four times a day (QID) | ORAL | Status: DC | PRN

## 2018-04-16 MED ORDER — IRBESARTAN 300 MG PO TABS
300.0000 mg | ORAL_TABLET | Freq: Every day | ORAL | Status: DC
  Administered 2018-04-16 – 2018-04-17 (×2): 300 mg via ORAL
  Filled 2018-04-16: qty 2
  Filled 2018-04-16: qty 1
  Filled 2018-04-16: qty 2

## 2018-04-16 MED ORDER — PANTOPRAZOLE SODIUM 40 MG PO TBEC
40.0000 mg | DELAYED_RELEASE_TABLET | Freq: Every day | ORAL | Status: DC
  Administered 2018-04-16 – 2018-04-17 (×2): 40 mg via ORAL
  Filled 2018-04-16 (×2): qty 1

## 2018-04-16 NOTE — Progress Notes (Addendum)
Occupational Therapy Evaluation Patient Details Name: Richard Vaughn MRN: [id number] DOB: 03/10/1951 Today's Date: 04/16/2018    History of Present Illness [name] is a 67 y.o. male with history of HTN, HLD, stroke, LBP admitted for left sided weakness and numbness, slurry speech. No tPA given due to [name]. CT showed right IC/thalamic ICH.   Clinical Impression   PTA, pt independent with ADL and mobility and was very active in the community. Pt presents with significant sensorimotor deficits in addition to deficits listed below and currently requires Mod A with dynamic balance tasks during ADL @ RW level. Given pt's high functional level PTA and significant change in functional status and fall risk,  recommend [name] for intensive rehab to achieve modified independent level goals to facilitate safe DC home. Pt tearful at times during session. Will follow acutely to address established goals and DC to appropriate venue of care.  Recommend watching pt for signs/symptoms of PBA.     Follow Up Recommendations  [name];Supervision/Assistance - 24 hour    Equipment Recommendations  None recommended by OT    Recommendations for Other Services Rehab consult     Precautions / Restrictions Precautions Precautions: Fall      Mobility Bed Mobility Overal bed mobility: Needs Assistance Bed Mobility: Supine to Sit     Supine to sit: Supervision        Transfers Overall transfer level: Needs assistance Equipment used: Rolling walker (2 wheeled) Transfers: Sit to/from Stand Sit to Stand: Min guard         General transfer comment: use of hand, but otherwise steady with stand    Balance Overall balance assessment: Needs assistance Sitting-balance support: No upper extremity supported Sitting balance-Leahy Scale: Good Sitting balance - Comments: acceps moderate challenge with good righting reaction   Standing balance support: No upper extremity supported Standing balance-Leahy  Scale: Poor                             ADL either performed or assessed with clinical judgement   ADL Overall ADL's : Needs assistance/impaired Eating/Feeding: Set up;Supervision/ safety;Sitting   Grooming: Supervision/safety;Set up;Sitting   Upper Body Bathing: Minimal assistance;Sitting   Lower Body Bathing: Minimal assistance;Sit to/from stand   Upper Body Dressing : Minimal assistance;Sitting   Lower Body Dressing: Moderate assistance;Sit to/from stand   Toilet Transfer: Moderate assistance;RW;Ambulation Toilet Transfer Details (indicate cue type and reason): due to LOB toward L Toileting- Clothing Manipulation and Hygiene: Minimal assistance       Functional mobility during ADLs: Moderate assistance(at times) General ADL Comments: Pt required increaed assistance with dynamic tasks, losing balance toward L side; high fall risk     Vision Baseline Vision/History: Wears glasses Wears Glasses: Distance only Patient Visual Report: No change from baseline Vision Assessment?: Yes Eye Alignment: Within Functional Limits Ocular Range of Motion: Within Functional Limits Alignment/Gaze Preference: Within Defined Limits Tracking/Visual Pursuits: Decreased smoothness of horizontal tracking;Decreased smoothness of vertical tracking Saccades: Additional eye shifts occurred during testing Convergence: Within functional limits Visual Fields: No apparent deficits Additional Comments: will further assess; able to read; staes words look "normal"     Perception Perception Comments: will further assess   Praxis      Pertinent Vitals/Pain Pain Assessment: No/denies pain     Hand Dominance Right   Extremity/Trunk Assessment Upper Extremity Assessment Upper Extremity Assessment: LUE deficits/detail LUE Deficits / Details: isolated joint movemetn with generalized weakness, but funtional  AROM; significant sensorimotor deficits affecting coordinated movement patterns;  frequently dropping itmes; "clumsy hand"   Lower Extremity Assessment Lower Extremity Assessment: LLE deficits/detail RLE Deficits / Details: sensorimotor deficits; difficulty clearing LLE with ambulation   Cervical / Trunk Assessment Cervical / Trunk Assessment: Other exceptions Cervical / Trunk Exceptions: L bias   Communication Communication Communication: (dysarthric, but intelligible)   Cognition Arousal/Alertness: Awake/alert Behavior During Therapy: Flat affect(terful at times during session) Overall Cognitive Status: Impaired/Different from baseline(per SLP not test formally) Area of Impairment: Awareness;Attention                   Current Attention Level: Selective       Awareness: Emergent   General Comments: will further assess   General Comments  VSS    Exercises Exercises: Other exercises Other Exercises Other Exercises: BUE integrated acttivities (cup stacking) to comlete   Shoulder Instructions      Home Living Family/patient expects to be discharged to:: Unsure Living Arrangements: Spouse/significant other(2 grand children) Available Help at Discharge: Family;Available 24 hours/day Type of Home: House Home Access: Stairs to enter [employer] of Steps: 2 Entrance Stairs-Rails: Right;Left Home Layout: Two level;Able to live on main level with bedroom/bathroom Alternate Level Stairs-Number of Steps: flight Alternate Level Stairs-Rails: Right;Left Bathroom Shower/Tub: Occupational psychologist: Standard Bathroom Accessibility: Yes How Accessible: Accessible via walker Home Equipment: [place] - 2 wheels;Bedside commode;Shower seat          Prior Functioning/Environment Level of Independence: Independent        Comments: enjoys water aerobics; deep sea fishing        OT Problem List: Decreased strength;Decreased activity tolerance;Impaired balance (sitting and/or standing);Decreased coordination;Decreased  cognition;Decreased safety awareness;Decreased knowledge of use of DME or AE;Impaired UE functional use      OT Treatment/Interventions: Self-care/ADL training;Neuromuscular education;DME and/or AE instruction;Therapeutic activities;Cognitive remediation/compensation;Visual/perceptual remediation/compensation;Patient/family education;Balance training    OT Goals(Current goals can be found in the care plan section) Acute Rehab OT Goals Patient Stated Goal: back to doing everything I want to do. OT Goal Formulation: With patient Time For Goal Achievement: 04/30/18 Potential to Achieve Goals: Good  OT Frequency: Min 3X/week   Barriers to D/C:            Co-evaluation              AM-PAC PT "6 Clicks" Daily Activity     Outcome Measure Help from another person eating meals?: A Little Help from another person taking care of personal grooming?: A Little Help from another person toileting, which includes using toliet, bedpan, or urinal?: A Lot Help from another person bathing (including washing, rinsing, drying)?: A Little Help from another person to put on and taking off regular upper body clothing?: A Little Help from another person to put on and taking off regular lower body clothing?: A Lot 6 Click Score: 16   End of Session Equipment Utilized During Treatment: Gait belt;Rolling walker Nurse Communication: Mobility status;Other (comment)(DC recommendations)  Activity Tolerance: Patient tolerated treatment well Patient left: in chair;with call bell/phone within reach;with chair alarm set;with family/visitor present  OT Visit Diagnosis: Other abnormalities of gait and mobility (R26.89);Muscle weakness (generalized) (M62.81);Ataxia, unspecified (R27.0);Other symptoms and signs involving cognitive function;Hemiplegia and hemiparesis Hemiplegia - Right/Left: Left Hemiplegia - dominant/non-dominant: Non-Dominant Hemiplegia - caused by: Nontraumatic intracerebral hemorrhage                 Time: 2423-5361 OT Time Calculation (min): 33 min Charges:  OT General  Charges $OT Visit: 1 Visit OT Evaluation $OT Eval Moderate Complexity: 1 Mod OT Treatments $Self Care/Home Management : 8-22 mins G-Codes:     Maurie Boettcher, OT/L  OT Clinical Specialist [phone]   [facility] 04/16/2018, 4:26 PM

## 2018-04-16 NOTE — Progress Notes (Signed)
Inpatient Rehabilitation  Per OT recommendation, patient was screened by Gunnar Fusi for appropriateness for an Inpatient Acute Rehab consult.  Hopeful that with a brief intensive rehab stay he would be able to achieve Mod I prior to returning home.  At this time we are recommending an Inpatient Rehab consult.  Please order if you are agreeable.  Carmelia Roller., CCC/SLP Admission Coordinator  [facility]  Cell [phone]

## 2018-04-16 NOTE — Progress Notes (Signed)
  Echocardiogram 2D Echocardiogram has been performed.  Jennette Dubin 04/16/2018, 8:53 AM

## 2018-04-16 NOTE — Progress Notes (Signed)
STROKE TEAM PROGRESS NOTE   SUBJECTIVE (INTERVAL HISTORY) His wife and Rn are at the bedside.  Overall he feels his condition is stable. CT head repeat yesterday showed stable hematoma. BP better but still on cleviprex IV. Passed swallow and will resume home BP meds. PT/OT recommend outpt PT/OT.     OBJECTIVE Temp:  [98.4 F (36.9 C)-99.1 F (37.3 C)] 98.5 F (36.9 C) (06/02 0800) Pulse Rate:  [64-179] 80 (06/02 0645) Resp:  [6-31] 18 (06/02 0645) BP: (100-141)/(65-102) 125/85 (06/02 0645) SpO2:  [85 %-98 %] 94 % (06/02 0645)  Recent Labs  Lab 04/15/18 0127  GLUCAP 108*   Recent Labs  Lab 04/15/18 0127 04/15/18 0134  NA 140 140  K 3.5 3.4*  CL 108 104  CO2 23  --   GLUCOSE 109* 106*  BUN 8 7  CREATININE 1.26* 1.10  CALCIUM 8.7*  --    Recent Labs  Lab 04/15/18 0127  AST 17  ALT 20  ALKPHOS 77  BILITOT 0.7  PROT 7.0  ALBUMIN 4.0   Recent Labs  Lab 04/15/18 0127 04/15/18 0134  WBC 8.8  --   NEUTROABS 4.7  --   HGB 14.7 14.6  HCT 43.8 43.0  MCV 90.3  --   PLT 165  --    No results for input(s): CKTOTAL, CKMB, CKMBINDEX, TROPONINI in the last 168 hours. Recent Labs    04/15/18 0127  LABPROT 13.7  INR 1.06   Recent Labs    04/15/18 1654  COLORURINE STRAW*  LABSPEC 1.009  PHURINE 7.0  GLUCOSEU NEGATIVE  HGBUR NEGATIVE  BILIRUBINUR NEGATIVE  KETONESUR NEGATIVE  PROTEINUR NEGATIVE  NITRITE NEGATIVE  LEUKOCYTESUR NEGATIVE       Component Value Date/Time   CHOL 112 04/15/2018 0802   TRIG 327 (H) 04/15/2018 0802   TRIG 121 11/01/2006 1056   HDL 20 (L) 04/15/2018 0802   CHOLHDL 5.6 04/15/2018 0802   VLDL 65 (H) 04/15/2018 0802   LDLCALC 27 04/15/2018 0802   Lab Results  Component Value Date   HGBA1C 5.3 04/15/2018      Component Value Date/Time   LABOPIA NONE DETECTED 04/15/2018 1630   COCAINSCRNUR NONE DETECTED 04/15/2018 1630   LABBENZ NONE DETECTED 04/15/2018 1630   AMPHETMU NONE DETECTED 04/15/2018 1630   THCU POSITIVE (A)  04/15/2018 1630   LABBARB NONE DETECTED 04/15/2018 1630    Recent Labs  Lab 04/15/18 0127  ETH <10    I have personally reviewed the radiological images below and agree with the radiology interpretations.  Ct Head Wo Contrast  Result Date: 04/15/2018 CLINICAL DATA:  Slurred speech and left-sided weakness. Imaged in the right thalamus by CT scan last night. EXAM: CT HEAD WITHOUT CONTRAST TECHNIQUE: Contiguous axial images were obtained from the base of the skull through the vertex without intravenous contrast. COMPARISON:  CT scan 04/15/2018. FINDINGS: Brain: Right thalamic hemorrhage measuring approximately 1.5 cm AP by 1.2 cm transverse by 1.4 cm craniocaudal is again seen. There has been no progression of the bleed. No new hemorrhage is identified. Chronic microvascular ischemic change is noted. No midline shift or hydrocephalus. Vascular: No hyperdense vessel or unexpected calcification. Skull: Intact. Sinuses/Orbits: Negative. Other: None. IMPRESSION: No change in a right thalamic hemorrhage.  No new abnormality. Electronically Signed   By: Inge Rise M.D.   On: 04/15/2018 10:11   Ct Head Code Stroke Wo Contrast  Result Date: 04/15/2018 CLINICAL DATA:  Code stroke. 67 y/o M; slurred speech, left-sided weakness. EXAM:  CT HEAD WITHOUT CONTRAST TECHNIQUE: Contiguous axial images were obtained from the base of the skull through the vertex without intravenous contrast. COMPARISON:  09/22/2016 MRI and CT head.  09/23/2016 CTA of head. FINDINGS: Brain: Acute hemorrhage within the right thalamus measuring 1.5 x 1.2 x 1.4 cm (volume = 1.3 cm^3) (AP x ML x CC series 3, image 16 and series 5, image 37). Mild surrounding edema with local mass effect. Stable chronic lacunar infarcts within the left lentiform nucleus extending into corona radiata. Stable advanced chronic microvascular ischemic changes of white matter. Stable mild brain parenchymal volume loss. No evidence for large acute infarct,  hydrocephalus, or herniation. Vascular: Calcific atherosclerosis of carotid siphons. No hyperdense vessel. Skull: Normal. Negative for fracture or focal lesion. Sinuses/Orbits: Mild mucosal thickening of the ethmoid air cells and mucosal thickening of the right maxillary sinus. Partial opacification of left mastoid air cells. Orbits are unremarkable. Other: None. IMPRESSION: 1. Acute hemorrhage within the right thalamus measuring up to 1.5 cm, 1.3 cc. Mild associated edema and mass effect. 2. Stable advanced chronic microvascular ischemic changes of the brain and mild brain parenchymal volume loss. 3. ASPECTS is not applicable. Critical Value/emergent results were called by telephone at the time of interpretation on 04/15/2018 at 1:46 am to Dr. Rory Percy, who verbally acknowledged these results. Electronically Signed   By: Kristine Garbe M.D.   On: 04/15/2018 01:53   Carotid Doppler  pending  TTE pending   PHYSICAL EXAM  Temp:  [98.4 F (36.9 C)-99.1 F (37.3 C)] 98.5 F (36.9 C) (06/02 0800) Pulse Rate:  [64-179] 80 (06/02 0645) Resp:  [6-31] 18 (06/02 0645) BP: (100-141)/(65-102) 125/85 (06/02 0645) SpO2:  [85 %-98 %] 94 % (06/02 0645)  General - Well nourished, well developed, in no apparent distress.  Ophthalmologic - fundi not visualized due to noncooperation.  Cardiovascular - Regular rate and rhythm.  Mental Status -  Level of arousal and orientation to time, place, and person were intact. Language including expression, naming, repetition, comprehension was assessed and found intact. Fund of Knowledge was assessed and was intact.  Cranial Nerves II - XII - II - Visual field intact OU. III, IV, VI - Extraocular movements intact. V - Facial sensation decreased on the left, 40-50% of the right. VII - left facial droop. VIII - Hearing & vestibular intact bilaterally. X - Palate elevates symmetrically, mild dysarthria. XI - Chin turning & shoulder shrug intact  bilaterally. XII - Tongue protrusion intact.  Motor Strength - The patient's strength was normal in all extremities except LUE 4+/5 bicep and tricep with pronator drift.  Bulk was normal and fasciculations were absent.   Motor Tone - Muscle tone was assessed at the neck and appendages and was normal.  Reflexes - The patient's reflexes were symmetrical in all extremities and he had no pathological reflexes.  Sensory - Light touch, temperature/pinprick were assessed and were decreased on the left, 40-50% of the right.    Coordination - The patient had normal movements in the hands with no ataxia or dysmetria.  Tremor was absent.  Gait and Station - deferred.   ASSESSMENT/PLAN Richard Vaughn is a 67 y.o. male with history of HTN, HLD, stroke, LBP admitted for left sided weakness and numbness, slurry speech. No tPA given due to [place].    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  Resultant left facial droop and left UE mild weakness  CT head right IC/thalamic ICH  CT repeat  stable hemoatoma  Carotid Doppler  pending  2D Echo  pending  LDL 27  HgbA1c 5.3  UDS positive for THC  SCDs for VTE prophylaxis  aspirin 325 mg daily prior to admission, now on No antithrombotic due to [place]  Patient counseled to be compliant with his antithrombotic medications  Ongoing aggressive stroke risk factor management  Therapy recommendations:  outpt PT  Disposition:  Pending  Hx of stroke  Left BG/CR infarct in the past - no residue  09/2016 right facial tingling, MRI no acute infarct but old BG/CR infarct, CTA head and neck neg, EF 55-60%, LDL 86 and A1C 5.4. Considered recrudescence of old stroke, on ASA and lipitor  Hypertension Unstable BP goal < 160 On cleviprex, taper if able Resume BP meds with avapro  Tobacco abuse  Current smoker  Smoking cessation counseling provided  Pt is willing to quit  Other Stroke Risk Factors  Advanced age  THC  use  Obesity, Body mass index is 35 kg/m.   Other Active Problems  Elevated Cre - 1.26->1.10  Hospital day # 1  This patient is critically ill due to [place], hypertensive emergency and at significant risk of neurological worsening, death form rebleeding, stroke, heart failure, cerebral edema. This patient's care requires constant monitoring of vital signs, hemodynamics, respiratory and cardiac monitoring, review of multiple databases, neurological assessment, discussion with family, other specialists and medical decision making of high complexity. I spent 30 minutes of neurocritical care time in the care of this patient. I had long discussion with pt and wife at bedside, updated pt current condition, treatment plan and potential prognosis. She expressed understanding and appreciation.   Rosalin Hawking, MD PhD Stroke Neurology 04/16/2018 9:33 AM    To contact Stroke Continuity provider, please refer to [email or web address] After hours, contact General Neurology

## 2018-04-17 ENCOUNTER — Inpatient Hospital Stay (HOSPITAL_COMMUNITY): Payer: Federal, State, Local not specified - PPO

## 2018-04-17 DIAGNOSIS — G8929 Other chronic pain: Secondary | ICD-10-CM | POA: Diagnosis not present

## 2018-04-17 DIAGNOSIS — I610 Nontraumatic intracerebral hemorrhage in hemisphere, subcortical: Secondary | ICD-10-CM

## 2018-04-17 DIAGNOSIS — I6930 Unspecified sequelae of cerebral infarction: Secondary | ICD-10-CM

## 2018-04-17 DIAGNOSIS — M545 Low back pain: Secondary | ICD-10-CM

## 2018-04-17 DIAGNOSIS — G894 Chronic pain syndrome: Secondary | ICD-10-CM | POA: Diagnosis not present

## 2018-04-17 DIAGNOSIS — I10 Essential (primary) hypertension: Secondary | ICD-10-CM

## 2018-04-17 DIAGNOSIS — E785 Hyperlipidemia, unspecified: Secondary | ICD-10-CM | POA: Diagnosis present

## 2018-04-17 DIAGNOSIS — F1210 Cannabis abuse, uncomplicated: Secondary | ICD-10-CM | POA: Diagnosis present

## 2018-04-17 DIAGNOSIS — I69391 Dysphagia following cerebral infarction: Secondary | ICD-10-CM | POA: Diagnosis not present

## 2018-04-17 DIAGNOSIS — Z720 Tobacco use: Secondary | ICD-10-CM

## 2018-04-17 LAB — CBC
HEMATOCRIT: 43.6 % (ref 39.0–52.0)
HEMOGLOBIN: 14.8 g/dL (ref 13.0–17.0)
MCH: 30.2 pg (ref 26.0–34.0)
MCHC: 33.9 g/dL (ref 30.0–36.0)
MCV: 89 fL (ref 78.0–100.0)
PLATELETS: 168 10*3/uL (ref 150–400)
RBC: 4.9 MIL/uL (ref 4.22–5.81)
RDW: 12 % (ref 11.5–15.5)
WBC: 9 10*3/uL (ref 4.0–10.5)

## 2018-04-17 LAB — BASIC METABOLIC PANEL
ANION GAP: 9 (ref 5–15)
BUN: 11 mg/dL (ref 6–20)
CHLORIDE: 106 mmol/L (ref 101–111)
CO2: 24 mmol/L (ref 22–32)
Calcium: 9 mg/dL (ref 8.9–10.3)
Creatinine, Ser: 1.07 mg/dL (ref 0.61–1.24)
GFR calc Af Amer: >60 mL/min (ref >60)
GLUCOSE: 96 mg/dL (ref 65–99)
POTASSIUM: 3.7 mmol/L (ref 3.5–5.1)
Sodium: 139 mmol/L (ref 135–145)

## 2018-04-17 NOTE — Progress Notes (Signed)
Physical Therapy Treatment Patient Details Name: Richard Vaughn MRN: [id number] DOB: 09-10-1951 Today's Date: 04/17/2018    History of Present Illness Richard Vaughn is a 67 y.o. male with history of HTN, HLD, stroke, LBP admitted for left sided weakness and numbness, slurry speech. No tPA given due to [place]. CT showed right IC/thalamic ICH.    PT Comments    Pt's wife and brother present for gait training and stair training session.  Answered wife's question and settled any fears about not being able to manage pt at home.   Follow Up Recommendations  Outpatient PT     Equipment Recommendations  None recommended by PT    Recommendations for Other Services       Precautions / Restrictions Precautions Precautions: Fall Restrictions Weight Bearing Restrictions: No    Mobility  Bed Mobility               General bed mobility comments: OOB on arrival  Transfers Overall transfer level: Needs assistance Equipment used: None Transfers: Sit to/from Stand Sit to Stand: Min guard         General transfer comment: minguard for safety   Ambulation/Gait Ambulation/Gait assistance: Min assist Ambulation Distance (Feet): 200 Feet Assistive device: None Gait Pattern/deviations: Step-through pattern Gait velocity: slower   General Gait Details: less paretic in nature, but still needs some w/bearing assist and then truncal stability as pt fatigued.   Stairs Stairs: Yes Stairs assistance: Min assist Stair Management: One rail Right;Step to pattern;Forwards Number of Stairs: 7 General stair comments: pt is steady with step to pattern on the right, but when tries to alternate steps, he stubbs his R toe consistently.     Wheelchair Mobility    Modified Rankin (Stroke Patients Only) Modified Rankin (Stroke Patients Only) Pre-Morbid Rankin Score: No significant disability Modified Rankin: Moderately severe disability     Balance Overall balance assessment: Needs  assistance Sitting-balance support: No upper extremity supported Sitting balance-Leahy Scale: Good     Standing balance support: No upper extremity supported;During functional activity Standing balance-Leahy Scale: Fair Standing balance comment: able to maintain static standing without UE support and minguard                             Cognition Arousal/Alertness: Awake/alert Behavior During Therapy: WFL for tasks assessed/performed Overall Cognitive Status: Impaired/Different from baseline Area of Impairment: Awareness;Attention                   Current Attention Level: Selective       Awareness: Emergent   General Comments: pt slow to process at times and requires min multimodal cues; though demonstrates good safety awareness, pt pausing during mobility when feeling unsteady or fatigued to regroup prior to ambulating further within room       Exercises Other Exercises Other Exercises: educated on LUE exercises and fine motor/coordination activities to complete with fine motor handout provided     General Comments General comments (skin integrity, edema, etc.): vss,  Discussed with OT on having pt/family use RW at home until gets to OPPT.      Pertinent Vitals/Pain Pain Assessment: No/denies pain    Home Living                      Prior Function            PT Goals (current goals can now be found in the care  plan section) Acute Rehab PT Goals Patient Stated Goal: back to doing everything I want to do. PT Goal Formulation: With patient Time For Goal Achievement: 04/29/18 Potential to Achieve Goals: Good Progress towards PT goals: Progressing toward goals    Frequency    Min 4X/week      PT Plan Current plan remains appropriate    Co-evaluation              AM-PAC PT "6 Clicks" Daily Activity  Outcome Measure  Difficulty turning over in bed (including adjusting bedclothes, sheets and blankets)?: None Difficulty  moving from lying on back to sitting on the side of the bed? : None Difficulty sitting down on and standing up from a chair with arms (e.g., wheelchair, bedside commode, etc,.)?: None Help needed moving to and from a bed to chair (including a wheelchair)?: A Little Help needed walking in hospital room?: A Little Help needed climbing 3-5 steps with a railing? : A Little 6 Click Score: 21    End of Session   Activity Tolerance: Patient tolerated treatment well     PT Visit Diagnosis: Unsteadiness on feet (R26.81);Hemiplegia and hemiparesis Hemiplegia - Right/Left: Left Hemiplegia - dominant/non-dominant: Non-dominant Hemiplegia - caused by: Nontraumatic intracerebral hemorrhage     Time: 1025-8527 PT Time Calculation (min) (ACUTE ONLY): 14 min  Charges:  $Gait Training: 8-22 mins                    G Codes:       May 16, 2018  Donnella Sham, PT [phone]  (pager)   Richard Vaughn 05-16-18, 6:33 PM

## 2018-04-17 NOTE — Care Management Note (Signed)
Case Management Note  Patient Details  Name: Richard Vaughn MRN: [id number] Date of Birth: 1951/05/12  Subjective/Objective:  Pt admitted on 04/15/18 with Rt thalamic ICH.  PTA, pt independent, lives with spouse.                 Action/Plan: PT/OT recommending OP follow up, and pt agreeable to OP follow up at [facility].  Noted pt has issues with swallowing as well; will add OP speech therapy. Referral to [facility] for RW for home use.    Expected Discharge Date:  04/17/18               Expected Discharge Plan:  OP Rehab  In-House Referral:     Discharge planning Services  CM Consult  Post Acute Care Choice:    Choice offered to:     DME Arranged:  [facility] DME Agency:  [place]:    [facility] Agency:     Status of Service:  Completed, signed off  If discussed at [place] of Stay Meetings, dates discussed:    Additional Comments:  Reinaldo Raddle, RN, BSN  Trauma/Neuro ICU Case Manager [phone]

## 2018-04-17 NOTE — Progress Notes (Signed)
Occupational Therapy Treatment Patient Details Name: Richard Vaughn MRN: [id number] DOB: 11-08-1951 Today's Date: 04/17/2018    History of present illness [name] is a 67 y.o. male with history of HTN, HLD, stroke, LBP admitted for left sided weakness and numbness, slurry speech. No tPA given due to [place]. CT showed right IC/thalamic ICH.   OT comments  Pt progressing towards OT goals, presents sitting up in recliner upon arrival, agreeable to tx session. Pt completing room level functional mobility initially without AD with minA-minguard assist; completed further mobility using RW and pt completing with overall minguard assist. Pt reports feeling more stable using RW at this time, as he intermittently feels "wobbly" without AD. Pt completed standing grooming ADLs with minguard for static standing balance without UE support. Noted pt now returning home vs [name], updated discharge recommendations to reflect as pt will benefit from continued outpatient neuro OT services after discharge home. Will continue to follow while pt remains in acute setting to progress pt towards established OT goals.   Follow Up Recommendations  Outpatient OT;Supervision/Assistance - 24 hour(outpt neuro OT )    Equipment Recommendations  None recommended by OT          Precautions / Restrictions Precautions Precautions: Fall Restrictions Weight Bearing Restrictions: No       Mobility Bed Mobility               General bed mobility comments: in chair on arrival  Transfers Overall transfer level: Needs assistance Equipment used: Rolling walker (2 wheeled);None Transfers: Sit to/from Stand Sit to Stand: Min guard         General transfer comment: minguard for safety     Balance Overall balance assessment: Needs assistance Sitting-balance support: No upper extremity supported Sitting balance-Leahy Scale: Good     Standing balance support: No upper extremity supported;During functional  activity Standing balance-Leahy Scale: Fair Standing balance comment: able to maintain static standing without UE support and minguard                            ADL either performed or assessed with clinical judgement   ADL Overall ADL's : Needs assistance/impaired     Grooming: Min guard;Standing;Wash/dry hands Grooming Details (indicate cue type and reason): minguard for static standing balance                            Tub/Shower Transfer Details (indicate cue type and reason): educated to use shower chair during bathing tasks for increased safety Functional mobility during ADLs: Minimal assistance;Min guard;Rolling walker General ADL Comments: Pt initially completing room level functional mobility without AD and minguard-minA; pt reports feeling "wobbly", and demonstrates slow cautious movements, trialed use of RW within room during ambulation and pt reports feeling more stable, ambulating with overall minguard assist, pt's step-daughter confirmed that pt has RW at home; educated to have someone with him initially when up and moving at home and during dynamic ADL task completion                        Cognition Arousal/Alertness: Awake/alert Behavior During Therapy: [facility] for tasks assessed/performed   Area of Impairment: Awareness;Attention                   Current Attention Level: Selective       Awareness: Emergent   General Comments: pt  slow to process at times and requires min multimodal cues; though demonstrates good safety awareness, pt pausing during mobility when feeling unsteady or fatigued to regroup prior to ambulating further within room         Exercises Other Exercises Other Exercises: educated on LUE exercises and fine motor/coordination activities to complete with fine motor handout provided                 Pertinent Vitals/ Pain       Pain Assessment: No/denies pain                                                           Frequency  Min 3X/week        Progress Toward Goals  OT Goals(current goals can now be found in the care plan section)  Progress towards OT goals: Progressing toward goals  Acute Rehab OT Goals Patient Stated Goal: back to doing everything I want to do. OT Goal Formulation: With patient Time For Goal Achievement: 04/30/18 Potential to Achieve Goals: Good  Plan Discharge plan needs to be updated                     AM-PAC PT "6 Clicks" Daily Activity     Outcome Measure   Help from another person eating meals?: A Little Help from another person taking care of personal grooming?: A Little Help from another person toileting, which includes using toliet, bedpan, or urinal?: A Little Help from another person bathing (including washing, rinsing, drying)?: A Little Help from another person to put on and taking off regular upper body clothing?: A Little Help from another person to put on and taking off regular lower body clothing?: A Little 6 Click Score: 18    End of Session Equipment Utilized During Treatment: Gait belt;Rolling walker  OT Visit Diagnosis: Other abnormalities of gait and mobility (R26.89);Muscle weakness (generalized) (M62.81);Ataxia, unspecified (R27.0);Other symptoms and signs involving cognitive function;Hemiplegia and hemiparesis Hemiplegia - Right/Left: Left Hemiplegia - dominant/non-dominant: Non-Dominant Hemiplegia - caused by: Nontraumatic intracerebral hemorrhage   Activity Tolerance Patient tolerated treatment well   Patient Left in chair;with call bell/phone within reach;with family/visitor present   Nurse Communication Mobility status        Time: 2992-4268 OT Time Calculation (min): 20 min  Charges: OT General Charges $OT Visit: 1 Visit OT Treatments $Self Care/Home Management : 8-22 mins  Lou Cal, OT Pager [phone] 04/17/2018   Raymondo Band 04/17/2018, 3:16 PM

## 2018-04-17 NOTE — Discharge Planning (Signed)
Patient IV x2 removed. RN assessment revealed stability for DC to home with out-patient rehab. Discharge papers give, explained and educated. Informed of suggested FU appts and appts made. Also left message with Neuro office requesting them to contact patient to set up FU appts x2 (sleep and stroke).  Outpatient Rehab contacted by CM and will contact patinet to set up first appts.  No scripts needed at this time.  Once ready, will be wheeled to front and family will be transporting home via car.

## 2018-04-17 NOTE — Consult Note (Signed)
Physical Medicine and Rehabilitation Consult Reason for Consult: Left-sided weakness and slurred speech Referring Physician: Dr.Xu   HPI: Richard Vaughn is a 67 y.o. 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  Per chart review and patient, patient lives with spouse and 2 grandchildren ages 22 and 54.  2 level home with bedroom on Main level and 2 steps to entry.  Independent prior to admission.  Presented 04/15/2018 with left-sided weakness and slurred speech.  Noted systolic blood pressures in the 140s.  Cranial CT scan reviewed, showing tight thalamic hemorrhage. Per report, acute hemorrhage within the right thalamus measuring up to 1.5 cm.  Mild associated edema and mass-effect.  Echocardiogram with ejection fraction of 70% no wall motion abnormalities.  Urine drug screen positive marijuana.  Mechanical soft diet with thin liquids.  Physical and occupational therapy evaluations completed with recommendations of physical medicine rehab consult.  Review of Systems  Constitutional: Negative for chills and fever.  HENT: Negative for hearing loss.   Eyes: Negative for blurred vision and double vision.  Respiratory: Negative for cough and shortness of breath.   Cardiovascular: Negative for chest pain, palpitations and leg swelling.  Gastrointestinal: Positive for constipation. Negative for nausea.       GERD  Genitourinary: Negative for dysuria, flank pain and hematuria.  Musculoskeletal:       Chronic back pain  Skin: Negative for rash.  Neurological: Positive for speech change, focal weakness and headaches.  All other systems reviewed and are negative.  Past Medical History:  Diagnosis Date  . Barrett's esophagus   . Chronic back pain   . Gastritis    Mild  . Gastroparesis   . GERD (gastroesophageal reflux  disease)   . Headache   . Hyperglycemia   . Nephrolithiasis   . Renal cyst   . Stricture and stenosis of esophagus   . Stroke ([place]) 07-2015  . Vision abnormalities    Past Surgical History:  Procedure Laterality Date  . [place] SURGERY  2012  . COLONOSCOPY    . [place] SURGERY  2005, 2010   replaced L4 and L5  . UPPER GASTROINTESTINAL ENDOSCOPY     Family History  Problem Relation Age of Onset  . Hypertension Father   . Stroke Father   . Hypertension Mother   . Liver cancer Mother   . Hypertension Sister        2 other sisters has also  . Stroke Sister   . Stroke Sister   . Hypertension Brother        2nd brother has also  . Colon cancer Neg Hx   . Esophageal cancer Neg Hx   . Rectal cancer Neg Hx   . Stomach cancer Neg Hx   . Colon polyps Neg Hx    Social History:  reports that he has been smoking cigarettes.  He has a 7.50 pack-year smoking history. He has never used smokeless tobacco. He reports that he has current or past drug history. Drugs: Oxycodone and Morphine. He reports that he does not drink alcohol. Allergies: No Known Allergies Medications Prior to Admission  Medication Sig Dispense Refill  . aspirin 325 MG tablet Take 1 tablet (325 mg total) by mouth daily.    Marland Kitchen NEXIUM 40 MG capsule TAKE 1 CAPSULE DAILY AT 12 NOON. 90 capsule 3  . olmesartan (  BENICAR) 20 MG tablet TAKE 1 TABLET BY MOUTH EVERY DAY 30 tablet 0  . oxymorphone (OPANA) 10 MG tablet Take 10 mg by mouth every 6 (six) hours as needed for pain.     . sildenafil (VIAGRA) 100 MG tablet Take one half to one tablet one hour prior to sexual activity. 6 tablet 6  . traZODone (DESYREL) 50 MG tablet TAKE 1 TABLET AT BEDTIME 30 tablet 0    Home: Home Living Family/patient expects to be discharged to:: Unsure Living Arrangements: Spouse/significant other(2 grand children) Available Help at Discharge: Family, Available 24 hours/day Type of Home: House Home Access: Stairs to enter [employer] of Steps: 2 Entrance Stairs-Rails: Right, Left Home Layout: Two level, Able to live on main level with bedroom/bathroom Alternate Level Stairs-Number of Steps: flight Alternate Level Stairs-Rails: Right, Left Bathroom Shower/Tub: Multimedia programmer: Standard Bathroom Accessibility: Yes Home Equipment: Environmental consultant - 2 wheels, Bedside commode, Shower seat  Functional History: Prior Function Level of Independence: Independent Comments: enjoys water aerobics; deep sea fishing Functional Status:  Mobility: Bed Mobility Overal bed mobility: Needs Assistance Bed Mobility: Supine to Sit Supine to sit: Supervision General bed mobility comments: mildly uncoordinated left side, but overall fluid and functional Transfers Overall transfer level: Needs assistance Equipment used: Rolling walker (2 wheeled) Transfers: Sit to/from Stand Sit to Stand: Min guard General transfer comment: use of hand, but otherwise steady with stand Ambulation/Gait Ambulation/Gait assistance: Min assist Ambulation Distance (Feet): 120 Feet Assistive device: None Gait Pattern/deviations: Step-through pattern General Gait Details: paretic gait on the Left with minimal w/shift assist given through the shoulder initially, then as he fatigued needed additional assist through the trunk.  Without assist, pt unable to swing left LE through. Gait velocity: slower Gait velocity interpretation: <1.8 ft/sec, indicate of risk for recurrent falls    ADL: ADL Overall ADL's : Needs assistance/impaired Eating/Feeding: Set up, Supervision/ safety, Sitting Grooming: Supervision/safety, Set up, Sitting Upper Body Bathing: Minimal assistance, Sitting Lower Body Bathing: Minimal assistance, Sit to/from stand Upper Body Dressing : Minimal assistance, Sitting Lower Body Dressing: Moderate assistance, Sit to/from stand Toilet Transfer: Moderate assistance, RW, Ambulation Toilet Transfer Details (indicate cue  type and reason): due to LOB toward L Toileting- Clothing Manipulation and Hygiene: Minimal assistance Functional mobility during ADLs: Moderate assistance(at times) General ADL Comments: Pt required increaed assistance with dynamic tasks, losing balance toward L side; high fall risk  Cognition: Cognition Overall Cognitive Status: Impaired/Different from baseline(per SLP not test formally) Orientation Level: Oriented X4 Attention: Focused, Sustained, Selective Focused Attention: Appears intact Sustained Attention: Appears intact Selective Attention: Impaired Selective Attention Impairment: Verbal complex, Functional basic Memory: Appears intact Awareness: Appears intact Problem Solving: Appears intact Executive Function: Reasoning Reasoning: Appears intact Behaviors: Lability(pt tearful as SLP documented outside room) Safety/Judgment: Appears intact Cognition Arousal/Alertness: Awake/alert Behavior During Therapy: Flat affect(terful at times during session) Overall Cognitive Status: Impaired/Different from baseline(per SLP not test formally) Area of Impairment: Awareness, Attention Current Attention Level: Selective Awareness: Emergent General Comments: will further assess  Blood pressure 120/86, pulse 64, temperature 98.7 F (37.1 C), temperature source Oral, resp. rate 15, height 6' 1.5" (1.867 m), weight 120.7 kg (266 lb 1.5 oz), SpO2 91 %. Physical Exam  Vitals reviewed. Constitutional: He is oriented to person, place, and time. He appears well-developed.  Obese  HENT:  Head: Normocephalic and atraumatic.  Eyes: EOM are normal. Right eye exhibits no discharge. Left eye exhibits no discharge.  Neck: Normal range of motion. Neck supple. No  thyromegaly present.  Cardiovascular: Normal rate, regular rhythm and normal heart sounds.  Respiratory: Effort normal and breath sounds normal. No respiratory distress.  GI: Soft. Bowel sounds are normal. He exhibits no distension.    Musculoskeletal:  No edema or tenderness in extremities  Neurological: He is alert and oriented to person, place, and time.  Speech is delayed as well as dysarthric but intelligible.  Follows full commands Motor: RUE/RLE: 5/5 proximal to distal LUE/LLE: 4+/5 proximal to distal  Skin: Skin is warm and dry.  Psychiatric: His affect is blunt.    Results for orders placed or performed during the hospital encounter of 04/15/18 (from the past 24 hour(s))  CBC     Status: None   Collection Time: 04/17/18  2:25 AM  Result Value Ref Range   WBC 9.0 4.0 - 10.5 K/uL   RBC 4.90 4.22 - 5.81 MIL/uL   Hemoglobin 14.8 13.0 - 17.0 g/dL   HCT 43.6 39.0 - 52.0 %   MCV 89.0 78.0 - 100.0 fL   MCH 30.2 26.0 - 34.0 pg   MCHC 33.9 30.0 - 36.0 g/dL   RDW 12.0 11.5 - 15.5 %   Platelets 168 150 - 400 K/uL  Basic metabolic panel     Status: None   Collection Time: 04/17/18  2:25 AM  Result Value Ref Range   Sodium 139 135 - 145 mmol/L   Potassium 3.7 3.5 - 5.1 mmol/L   Chloride 106 101 - 111 mmol/L   CO2 24 22 - 32 mmol/L   Glucose, Bld 96 65 - 99 mg/dL   BUN 11 6 - 20 mg/dL   Creatinine, Ser 1.07 0.61 - 1.24 mg/dL   Calcium 9.0 8.9 - 10.3 mg/dL   GFR calc non Af Amer >60 >60 mL/min   GFR calc Af Amer >60 >60 mL/min   Anion gap 9 5 - 15   Ct Head Wo Contrast  Result Date: 04/15/2018 CLINICAL DATA:  Slurred speech and left-sided weakness. Imaged in the right thalamus by CT scan last night. EXAM: CT HEAD WITHOUT CONTRAST TECHNIQUE: Contiguous axial images were obtained from the base of the skull through the vertex without intravenous contrast. COMPARISON:  CT scan 04/15/2018. FINDINGS: Brain: Right thalamic hemorrhage measuring approximately 1.5 cm AP by 1.2 cm transverse by 1.4 cm craniocaudal is again seen. There has been no progression of the bleed. No new hemorrhage is identified. Chronic microvascular ischemic change is noted. No midline shift or hydrocephalus. Vascular: No hyperdense vessel or  unexpected calcification. Skull: Intact. Sinuses/Orbits: Negative. Other: None. IMPRESSION: No change in a right thalamic hemorrhage.  No new abnormality. Electronically Signed   By: Inge Rise M.D.   On: 04/15/2018 10:11    Assessment/Plan: Diagnosis: acute hemorrhage within the right thalamus  Labs and images independently reviewed.  Records reviewed and summated above. Stroke: Continue secondary stroke prophylaxis and Risk Factor Modification listed below:   Blood Pressure Management:  Continue current medication with prn's with permisive HTN per primary team Tobacco abuse:   Left sided hemiparesis:  Motor recovery: Fluoxetine  1. Does the need for close, 24 hr/day medical supervision in concert with the patient's rehab needs make it unreasonable for this patient to be served in a less intensive setting? No  2. Co-Morbidities requiring supervision/potential complications: chronic back pain (Biofeedback training with therapies to help reduce reliance on opiate pain medications, particularly IV morphine, monitor pain control during therapies, and sedation at rest and titrate to maximum efficacy to ensure  participation and gains in therapies, resume Opana when appropriate), HTN (monitor and provide prns in accordance with increased physical exertion and pain), tobacco abuse (counsel), hyperlipidemia (meds), hx of left basal ganglia infarction with minimal residual deficits, marijuana abuse (counsel), post-stroke dysphagia (advance diet as tolerated) 3. Due to safety, disease management and patient education, does the patient require 24 hr/day rehab nursing? No 4. Does the patient require coordinated care of a physician, rehab nurse, PT (1-2 hrs/day, 5 days/week), OT (1-2 hrs/day, 5 days/week) and SLP (1-2 hrs/day, 5 days/week) to address physical and functional deficits in the context of the above medical diagnosis(es)? Potentially Addressing deficits in the following areas: balance,  endurance, locomotion, strength, transferring, bathing, dressing, toileting, speech, swallowing and psychosocial support 5. Can the patient actively participate in an intensive therapy program of at least 3 hrs of therapy per day at least 5 days per week? Yes 6. The potential for patient to make measurable gains while on inpatient rehab is good 7. Anticipated functional outcomes upon discharge from inpatient rehab are modified independent and supervision  with PT, modified independent and supervision with OT, modified independent with SLP. 8. Estimated rehab length of stay to reach the above functional goals is: 4-7 days. 9. Anticipated D/C setting: Home 10. Anticipated post D/C treatments: Outpatient therapy and Home excercise program 11. Overall Rehab/Functional Prognosis: good  RECOMMENDATIONS: This patient's condition is appropriate for continued rehabilitative care in the following setting: Pt doing functionally well. Does not appear to require [name] at present.  Recommend home with outpatient therapies and outpt PM&R follow up. Patient has agreed to participate in recommended program. Potentially Note that insurance prior authorization may be required for reimbursement for recommended care.  Comment: Rehab Admissions Coordinator to follow up.   I have personally performed a face to face diagnostic evaluation, including, but not limited to relevant history and physical exam findings, of this patient and developed relevant assessment and plan.  Additionally, I have reviewed and concur with the physician assistant's documentation above.   Delice Lesch, MD, ABPMR Lavon Paganini Angiulli, PA-C 04/17/2018

## 2018-04-17 NOTE — Progress Notes (Signed)
Inpatient Rehabilitation-Admissions Coordinator   [facility] met pt and wife at the bedside to follow up from PM&R consult. AC discussed recommended rehab setting (Outpatient) and addressed any concerns family had about returning home. Wife reports confidence in ability to manage his care at DC with outpatient services but did have questions about stair negotiation and equipment allocation. AC notified PT regarding request for stair management training prior to DC and notified CM of questions regarding equipment allocation. AC with sign off at this time. Call if questions.   Jhonnie Garner, OTR/L  Rehab Admissions Coordinator  [phone] 04/17/2018 10:42 AM

## 2018-04-17 NOTE — Progress Notes (Signed)
0500: Handoff report received from RN. Pt transferred to bed from wheelchair. Discussed plan of care for the shift and oriented to unit with wife at bedside; pt amenable to plan.  0700: Handoff report given to RN. No acute events overnight.

## 2018-04-17 NOTE — Progress Notes (Signed)
VASCULAR LAB PRELIMINARY  PRELIMINARY  PRELIMINARY  PRELIMINARY  Carotid duplex completed.    Preliminary report:  1-39% ICA plaquing. Vertebral artery flow is antegrade.   Emalyn Schou, RVT 04/17/2018, 11:18 AM

## 2018-04-17 NOTE — Discharge Summary (Addendum)
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 Barrett's esophagus   . Chronic back pain   . Gastritis    Mild  . Gastroparesis   . GERD (gastroesophageal reflux disease)   . Headache   . Hyperglycemia   . Nephrolithiasis   . Renal cyst   . Stricture and stenosis of esophagus   . Stroke ([place]) 07-2015  . Vision abnormalities    Past Surgical History:  Procedure Laterality Date  . [place] SURGERY  2012  . COLONOSCOPY    . [place] SURGERY  2005, 2010   replaced L4 and L5  . UPPER GASTROINTESTINAL ENDOSCOPY      Allergies as of 04/17/2018   No Known Allergies     Medication List    STOP taking these medications   aspirin 325 MG tablet     TAKE these medications   NEXIUM 40 MG capsule Generic drug:  esomeprazole TAKE 1 CAPSULE DAILY AT 12 NOON.   olmesartan 20 MG tablet Commonly known as:  BENICAR TAKE 1 TABLET BY MOUTH EVERY DAY   OPANA 10 MG tablet Generic drug:  oxymorphone Take 10 mg by mouth every 6 (six) hours as needed for pain.   sildenafil 100 MG tablet Commonly known as:  VIAGRA Take one half to one tablet one hour prior to sexual activity.   traZODone 50 MG tablet Commonly known as:  DESYREL TAKE 1 TABLET AT BEDTIME            Durable  Medical Equipment  (From admission, onward)        Start     Ordered   04/17/18 1211  For home use only DME Walker rolling  Once    Question:  Patient needs a walker to treat with the following condition  Answer:  Thalamic hemorrhage ([place])   04/17/18 1211      LABORATORY STUDIES CBC    Component Value Date/Time   WBC 9.0 04/17/2018 0225   RBC 4.90 04/17/2018 0225   HGB 14.8 04/17/2018 0225   HCT 43.6 04/17/2018 0225   PLT 168 04/17/2018 0225   MCV 89.0 04/17/2018 0225   MCH 30.2 04/17/2018 0225   MCHC 33.9 04/17/2018 0225   RDW 12.0 04/17/2018 0225   LYMPHSABS 3.4 04/15/2018 0127   MONOABS 0.6 04/15/2018 0127   EOSABS 0.1 04/15/2018 0127   BASOSABS 0.0 04/15/2018 0127   CMP    Component Value Date/Time   NA 139 04/17/2018 0225   K 3.7 04/17/2018 0225   CL 106 04/17/2018 0225   CO2 24 04/17/2018 0225   GLUCOSE 96 04/17/2018 0225   GLUCOSE 105 (H) 11/01/2006 1056   BUN 11 04/17/2018 0225   CREATININE  1.07 04/17/2018 0225   CALCIUM 9.0 04/17/2018 0225   PROT 7.0 04/15/2018 0127   ALBUMIN 4.0 04/15/2018 0127   AST 17 04/15/2018 0127   ALT 20 04/15/2018 0127   ALKPHOS 77 04/15/2018 0127   BILITOT 0.7 04/15/2018 0127   GFRNONAA >60 04/17/2018 0225   GFRAA >60 04/17/2018 0225   COAGS Lab Results  Component Value Date   INR 1.06 04/15/2018   INR 1.03 09/22/2016   INR 1.11 08/02/2015   Lipid Panel    Component Value Date/Time   CHOL 112 04/15/2018 0802   TRIG 327 (H) 04/15/2018 0802   TRIG 121 11/01/2006 1056   HDL 20 (L) 04/15/2018 0802   CHOLHDL 5.6 04/15/2018 0802   VLDL 65 (H) 04/15/2018 0802   LDLCALC 27 04/15/2018 0802   HgbA1C  Lab Results  Component Value Date   HGBA1C 5.3 04/15/2018   Urinalysis    Component Value Date/Time   COLORURINE STRAW (A) 04/15/2018 1654   APPEARANCEUR CLEAR 04/15/2018 1654   LABSPEC 1.009 04/15/2018 1654   PHURINE 7.0 04/15/2018 1654   GLUCOSEU NEGATIVE 04/15/2018 1654   GLUCOSEU NEGATIVE 08/07/2013 1552    HGBUR NEGATIVE 04/15/2018 1654   BILIRUBINUR NEGATIVE 04/15/2018 1654   BILIRUBINUR n 04/22/2015 1117   KETONESUR NEGATIVE 04/15/2018 1654   PROTEINUR NEGATIVE 04/15/2018 1654   UROBILINOGEN 1.0 08/02/2015 1521   NITRITE NEGATIVE 04/15/2018 1654   LEUKOCYTESUR NEGATIVE 04/15/2018 1654   Urine Drug Screen     Component Value Date/Time   LABOPIA NONE DETECTED 04/15/2018 1630   COCAINSCRNUR NONE DETECTED 04/15/2018 1630   LABBENZ NONE DETECTED 04/15/2018 1630   AMPHETMU NONE DETECTED 04/15/2018 1630   THCU POSITIVE (A) 04/15/2018 1630   LABBARB NONE DETECTED 04/15/2018 1630    Alcohol Level    Component Value Date/Time   ETH <10 04/15/2018 0127     SIGNIFICANT DIAGNOSTIC STUDIES Ct Head Wo Contrast  Result Date: 04/15/2018 CLINICAL DATA:  Slurred speech and left-sided weakness. Imaged in the right thalamus by CT scan last night. EXAM: CT HEAD WITHOUT CONTRAST TECHNIQUE: Contiguous axial images were obtained from the base of the skull through the vertex without intravenous contrast. COMPARISON:  CT scan 04/15/2018. FINDINGS: Brain: Right thalamic hemorrhage measuring approximately 1.5 cm AP by 1.2 cm transverse by 1.4 cm craniocaudal is again seen. There has been no progression of the bleed. No new hemorrhage is identified. Chronic microvascular ischemic change is noted. No midline shift or hydrocephalus. Vascular: No hyperdense vessel or unexpected calcification. Skull: Intact. Sinuses/Orbits: Negative. Other: None. IMPRESSION: No change in a right thalamic hemorrhage.  No new abnormality. Electronically Signed   By: Inge Rise M.D.   On: 04/15/2018 10:11   Ct Head Code Stroke Wo Contrast  Result Date: 04/15/2018 CLINICAL DATA:  Code stroke. 67 y/o M; slurred speech, left-sided weakness. EXAM: CT HEAD WITHOUT CONTRAST TECHNIQUE: Contiguous axial images were obtained from the base of the skull through the vertex without intravenous contrast. COMPARISON:  09/22/2016 MRI and CT head.   09/23/2016 CTA of head. FINDINGS: Brain: Acute hemorrhage within the right thalamus measuring 1.5 x 1.2 x 1.4 cm (volume = 1.3 cm^3) (AP x ML x CC series 3, image 16 and series 5, image 37). Mild surrounding edema with local mass effect. Stable chronic lacunar infarcts within the left lentiform nucleus extending into corona radiata. Stable advanced chronic microvascular ischemic changes of white matter. Stable mild brain parenchymal volume loss. No evidence for large acute infarct, hydrocephalus, or herniation. Vascular:  Calcific atherosclerosis of carotid siphons. No hyperdense vessel. Skull: Normal. Negative for fracture or focal lesion. Sinuses/Orbits: Mild mucosal thickening of the ethmoid air cells and mucosal thickening of the right maxillary sinus. Partial opacification of left mastoid air cells. Orbits are unremarkable. Other: None. IMPRESSION: 1. Acute hemorrhage within the right thalamus measuring up to 1.5 cm, 1.3 cc. Mild associated edema and mass effect. 2. Stable advanced chronic microvascular ischemic changes of the brain and mild brain parenchymal volume loss. 3. ASPECTS is not applicable. Critical Value/emergent results were called by telephone at the time of interpretation on 04/15/2018 at 1:46 am to Dr. Rory Percy, who verbally acknowledged these results. Electronically Signed   By: Kristine Garbe M.D.   On: 04/15/2018 01:53    Carotid Doppler   There is 1-39% bilateral ICA stenosis. Vertebral artery flow is antegrade.    2D Echocardiogram  - Left ventricle: The cavity size was normal. Systolic function was vigorous. The estimated ejection fraction was in the range of 65% to 70%. Wall motion was normal; there were no regional wall motion abnormalities. - Aorta: Aortic root dimension: 40 mm (ED). - Ascending aorta: The ascending aorta was mildly dilated. - Left atrium: The atrium was mildly dilated. - Right ventricle: The cavity size was mildly dilated. Wall thickness was normal. -  Right atrium: The atrium was mildly dilated. Impressions:   No cardiac source of emboli was indentified.    HISTORY OF PRESENT ILLNESS Richard Vaughn is a 67 y.o. male past medical history of hypertension, hyperlipidemia, Barrett's esophagus, prior left basal ganglia strokes with minimal residual deficits, was in his usual state of health till about 12:45 AM on 04/15/2018, when he had sudden onset of numbness affecting his lips, slurred speech and left-sided weakness.  He said he was in his normal state of health, he was taking the dog out and suddenly felt this numbness on his lips.  When he tried to speak, his speech was slurred.  He has had similar symptoms with his prior strokes, that alerted him to call his wife to call 911. 911 was called, EMTs arrived, there arrival, his blood pressure systolics were in the 295A, he had slurred speech, gait difficulty and disc herniation.  A code stroke was called and patient was brought into [facility]. He was alert awake oriented x3 with moderate dysarthria and left upper and lower extremity drift and left hemisensory loss. He was taken in for a stat noncontrast CT of the head that revealed a right basal ganglia bleed.  No IVH.  Less than 30 cc.  ICH score 0. Premorbid modified Rankin scale (mRS): 0. He was admitted to the neuro ICU for further evaluation and treatment.     HOSPITAL COURSE Richard Vaughn is a 67 y.o. male with history of HTN, HLD, stroke, LBP admitted for left sided weakness and numbness, slurry speech. No tPA given due to [place].    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  Resultant left facial droop and left UE mild weakness  CT head right IC/thalamic ICH  CT repeat stable hemoatoma  Carotid Doppler  B ICA 1-39% stenosis, VAs antegrade   2D Echo  EF 65-70%. No source of embolus   LDL 27  HgbA1c 5.3  UDS positive for THC  aspirin 325 mg daily prior to admission, now on No antithrombotic due  to [place]  Ongoing aggressive stroke risk factor management  Therapy recommendations:  outpt PT and OT  Disposition:  return home w/ wife  Hx of stroke  Left BG/CR infarct in the past - no residue  09/2016 right facial tingling, MRI no acute infarct but old BG/CR infarct, CTA head and neck neg, EF 55-60%, LDL 86 and A1C 5.4. Considered recrudescence of old stroke, on ASA and lipitor  Hypertensive Emergency  BP 170s/110s on arrival  Treated with cleviprex  BP now 100-120s off cleviprex, back on home meds  Continue home meds with avapro. No indication to increase based on hospital BPs  Follow up PCP in 2 weeks  Pt asked to take BP daily and take recordings to MD for follow up. Med adjustment if needed  BP goal now normotensive  Tobacco abuse  Current smoker  Smoking cessation counseling provided  Pt is willing to quit  Other Stroke Risk Factors  Advanced age  THC use  Obesity, Body mass index is 35 kg/m.   obstructive sleep apnea - study inconclusive in the past - did not follow up as recommended. Will ask Dr. Brett Fairy to reassess.  Other Active Problems  Elevated Cre - 1.26->1.07   DISCHARGE EXAM Blood pressure 124/80, pulse 63, temperature 98.3 F (36.8 C), temperature source Oral, resp. rate 16, height 6' 1.5" (1.867 m), weight 120.7 kg (266 lb 1.5 oz), SpO2 94 %. General - Well nourished, well developed, in no apparent distress.  Cardiovascular - Regular rate and rhythm.  Mental Status -  Level of arousal and orientation to time, place, and person were intact. Language including expression, naming, repetition, comprehension was assessed and found intact. Fund of Knowledge was assessed and was intact.  Cranial Nerves II - XII - II - Visual field intact OU. III, IV, VI - Extraocular movements intact. V - Facial sensation decreased on the left VII - left facial droop. VIII - Hearing & vestibular intact bilaterally. X - Palate elevates  symmetrically, mild dysarthria. XI - Chin turning & shoulder shrug intact bilaterally. XII - Tongue protrusion intact.  Motor Strength - The patient's strength was normal in all extremities except LUE 4+/5 bicep and tricep with pronator drift.  Bulk was normal and fasciculations were absent.   Motor Tone - Muscle tone was assessed at the neck and appendages and was normal.  Sensory - Light touch, temperature/pinprick were assessed and were decreased on the left  Coordination - Decreased FMM in the L hand. mild dysmetria w/ FNF.  Tremor was absent.  Gait and Station - deferred.   Discharge Diet   Dysphagia 3 thin liquids  DISCHARGE PLAN  Disposition:  Return home w/ wife  No antithrombotics for now given hemorrhage  Ongoing risk factor control by Primary Care Physician at time of discharge   Follow-up Martinique, [name], MD in 2 weeks.  Follow-up in [facility] in 4 weeks, office to schedule an appointment.   Follow up Dr. Brett Fairy for sleep study follow-up  45 minutes were spent preparing discharge.  Burnetta Sabin, MSN, APRN, ANVP-BC, AGPCNP-BC Advanced Practice Stroke Nurse [place] for Schedule & Pager information 04/17/2018 1:02 PM    ATTENDING NOTE: I reviewed above note and agree with the assessment and plan. I have made any additions or clarifications directly to the above note. Pt was seen and examined.   Pt lying in bed comfortably. Wife at bedside. Pt BP controlled well on BP meds. No acute issue overnight. PT/OT recommend outpt PT/OT. Educated on risk factor modification and quit smoking. Will follow up  with PCP after discharge. Will also follow up with neurology in 3-4 weeks and repeat CT head. If blood absorbed, will initiate ASA 81 in clinic. Pt is ready for discharge from neuro standpoint.    Rosalin Hawking, MD PhD Stroke Neurology 04/17/2018 1:42 PM

## 2018-04-18 ENCOUNTER — Other Ambulatory Visit: Payer: Self-pay | Admitting: Neurology

## 2018-04-18 DIAGNOSIS — G4733 Obstructive sleep apnea (adult) (pediatric): Secondary | ICD-10-CM

## 2018-04-20 ENCOUNTER — Other Ambulatory Visit: Payer: Self-pay | Admitting: *Deleted

## 2018-04-20 NOTE — Patient Outreach (Signed)
[facility]) Care Management  04/20/2018  Richard Vaughn [birth year] [id number]  Referral via Red Alert-EMMI-Stroke; Day#1-04/18/2018: Reason: Problems setting up rehab? "yes"  Telephone call to patient; spouse answered call & advised she was taking calls for patient. States patient having some issues with speaking but could give permission for her to take call. This RN care coordinator spoke with patient who gave HIPPA verification and spouse permission to speak with me regarding his healthcare concerns.   Spouse states they had set up appointment with primary care provider -Dr. Betty Martinique for 6/14; appointment with neurologist 7/1. States they had not received call from rehab center regarding outpatient physical, occupational & speech therapy.   Spouse voices that they wanted therapy to start as soon as possible. Spouse was asked if she had phone number to call appropriate office regarding outpatient  Rehabilitation States she does have number and will call to inquire about therapy appointment. Voices that patient is taking medications as prescribed. Voices he has transportation to appointments.    Voices no further concerns at this time.  EMMI red alert has ben addressed.  Plan: Send [facility] brochure. Case closure.  Sherrin Daisy, RN BSN [place] Management Coordinator [facility] Care Management  [phone]

## 2018-04-25 ENCOUNTER — Ambulatory Visit: Payer: Federal, State, Local not specified - PPO | Admitting: Occupational Therapy

## 2018-04-25 ENCOUNTER — Encounter: Payer: Self-pay | Admitting: Occupational Therapy

## 2018-04-25 ENCOUNTER — Other Ambulatory Visit: Payer: Self-pay

## 2018-04-25 ENCOUNTER — Ambulatory Visit: Payer: Federal, State, Local not specified - PPO | Attending: Neurology

## 2018-04-25 DIAGNOSIS — R2689 Other abnormalities of gait and mobility: Secondary | ICD-10-CM | POA: Insufficient documentation

## 2018-04-25 DIAGNOSIS — R2681 Unsteadiness on feet: Secondary | ICD-10-CM | POA: Diagnosis not present

## 2018-04-25 DIAGNOSIS — R29818 Other symptoms and signs involving the nervous system: Secondary | ICD-10-CM | POA: Diagnosis not present

## 2018-04-25 DIAGNOSIS — R270 Ataxia, unspecified: Secondary | ICD-10-CM | POA: Insufficient documentation

## 2018-04-25 DIAGNOSIS — M6281 Muscle weakness (generalized): Secondary | ICD-10-CM | POA: Diagnosis not present

## 2018-04-25 DIAGNOSIS — R293 Abnormal posture: Secondary | ICD-10-CM

## 2018-04-25 DIAGNOSIS — R471 Dysarthria and anarthria: Secondary | ICD-10-CM | POA: Insufficient documentation

## 2018-04-25 DIAGNOSIS — R208 Other disturbances of skin sensation: Secondary | ICD-10-CM

## 2018-04-25 NOTE — Therapy (Signed)
[facility] [address] [place], [place], [address] Phone: [phone]   Fax:  [phone]  Physical Therapy Evaluation  Patient Details  Name: Richard Vaughn MRN: [id number] Date of Birth: [birth year] Referring Provider: Dr. Erlinda Hong   Encounter Date: 04/25/2018  PT End of Session - 04/25/18 1347    Visit Number  1    Number of Visits  17    Date for PT Re-Evaluation  06/24/18    Authorization Type  Federal [employer]BS: 75 visits combines-awaiting to see if aquatic therapy is covered    PT Start Time  [phone] with OT    PT Stop Time  0931    PT Time Calculation (min)  40 min    Equipment Utilized During Treatment  Gait belt    Activity Tolerance  Patient tolerated treatment well    Behavior During Therapy  [facility] for tasks assessed/performed       Past Medical History:  Diagnosis Date  . Barrett's esophagus   . Chronic back pain   . Gastritis    Mild  . Gastroparesis   . GERD (gastroesophageal reflux disease)   . Headache   . Hyperglycemia   . Nephrolithiasis   . Renal cyst   . Stricture and stenosis of esophagus   . Stroke ([place]) 07-2015  . Vision abnormalities     Past Surgical History:  Procedure Laterality Date  . [place] SURGERY  2012  . COLONOSCOPY    . [place] SURGERY  2005, 2010   replaced L4 and L5  . UPPER GASTROINTESTINAL ENDOSCOPY      There were no vitals filed for this visit.   Subjective Assessment - 04/25/18 0857    Subjective  Pt d/c on 04/17/18 from the hospital after R [place] on 04/15/18.  Pt has difficulty keeping balance while walking without RW and does not always use it. Pt had one fall when he had the stroke but denied other falls.     Patient is accompained by:  Family member    Pertinent History  HTN, chronic back pain, HLD, visual abnormalities, sleep apnea, PAD, hx of L BG CVA    Patient Stated Goals  Walk without RW and get back to water aerobics at the [facility]    Currently in  Pain?  Yes    Pain Score  -- 3-4/10    Pain Location  Back    Pain Orientation  Lower    Pain Descriptors / Indicators  Aching    Pain Type  Chronic pain    Pain Radiating Towards  BLEs    Pain Onset  More than a month ago    Pain Frequency  Constant    Aggravating Factors   activity    Pain Relieving Factors  sitting in a chair          [facility] PT Assessment - 04/25/18 0900      Assessment   Medical Diagnosis  R ICH    Referring Provider  Dr. Erlinda Hong    Onset Date/Surgical Date  04/15/18    Hand Dominance  Right    Prior Therapy  PT in acute care      Precautions   Precautions  Fall      Restrictions   Weight Bearing Restrictions  No      Balance Screen   Has the patient fallen in the past 6 months  Yes    How many times?  1  Has the patient had a decrease in activity level because of a fear of falling?   No    Is the patient reluctant to leave their home because of a fear of falling?   Yes      [place]  Private residence    Living Arrangements  Spouse/significant other    Available Help at Discharge  Family    Type of [place] to enter    Entrance Stairs-Number of Steps  3    Entrance Stairs-Rails  Can reach both    [place]  One level;Other (Comment) finished basement    [place] - 2 wheels;Shower seat;Bedside commode      Prior Function   Level of Independence  Independent    Vocation  Retired    Leisure  Work in the yard, going to [employer] at [employer] Status  Within [employer] for tasks assessed      Observation/Other Assessments   Focus on Therapeutic Outcomes ([place])   FOTO started but appears incomplete.      Sensation   Light Touch  Impaired by gross assessment    Additional Comments  Decr. light touch in LLE and c/o N/T.       Coordination   Gross Motor Movements are Fluid and Coordinated  No    Fine Motor Movements are Fluid  and Coordinated  Not tested    Heel Shin Test  Decr. speed      Tone   Assessment Location  Other (comment)      Tone Assessment - Other   Other Tone Location  No c/o tone      ROM / Strength   AROM / PROM / Strength  AROM;Strength      AROM   Overall AROM   Within functional limits for tasks performed    Overall AROM Comments  BLE ROM WFL      Strength   Overall Strength  Deficits    Overall Strength Comments  BLE strength WFL except for 4-/5 B hip add/abd and hip ext suspected to be weak 2/2 gait deviations.       Transfers   Transfers  Sit to Stand;Stand to Sit    Sit to Stand  5: Supervision;With upper extremity assist;From chair/3-in-1;Without upper extremity assist    Stand to Sit  5: Supervision;With upper extremity assist;Without upper extremity assist;To chair/3-in-1    Number of Reps  2 sets    Comments  Pt required several attempts to perform without UE assist.       Ambulation/Gait   Ambulation/Gait  Yes    Ambulation/Gait Assistance  4: Min guard    Ambulation Distance (Feet)  100 Feet    Assistive device  Rolling walker    Gait Pattern  Step-through pattern;Decreased stride length;Decreased dorsiflexion - left;Shuffle    Ambulation Surface  Level;Indoor    Gait velocity  1.52ft/sec.       Standardized Balance Assessment   Standardized Balance Assessment  Berg Balance Test;Timed Up and Go Test      Berg Balance Test   Sit to Stand  Able to stand  independently using hands    Standing Unsupported  Able to stand 2 minutes with supervision    Sitting with Back Unsupported but Feet Supported on Floor or Stool  Able to sit safely and securely 2  minutes    Stand to Sit  Controls descent by using hands    Transfers  Able to transfer with verbal cueing and /or supervision    Standing Unsupported with Eyes Closed  Able to stand 10 seconds with supervision    Standing Ubsupported with Feet Together  Able to place feet together independently and stand for 1 minute with  supervision    From Standing, Reach Forward with Outstretched Arm  Can reach forward >12 cm safely (5") 7" RUE    From Standing Position, Pick up Object from Floor  Able to pick up shoe, needs supervision    From Standing Position, Turn to Look Behind Over each Shoulder  Looks behind one side only/other side shows less weight shift    Turn 360 Degrees  Able to turn 360 degrees safely but slowly    Standing Unsupported, Alternately Place Feet on Step/Stool  Able to complete >2 steps/needs minimal assist    Standing Unsupported, One Foot in Front  Able to take small step independently and hold 30 seconds    Standing on One Leg  Tries to lift leg/unable to hold 3 seconds but remains standing independently    Total Score  36    Berg comment:  36/56: indicates high falls risk.       Timed Up and Go Test   TUG  Normal TUG    Normal TUG (seconds)  26.22 with RW                Objective measurements completed on examination: See above findings.              PT Education - 04/25/18 1346    Education Details  PT exam and outcome measure results, PT POC, frequency, and duration.    Person(s) Educated  Patient;Spouse    Methods  Explanation    Comprehension  Verbalized understanding       PT Short Term Goals - 04/25/18 1353      PT SHORT TERM GOAL #1   Title  Pt will be IND in HEP to improve deficits listed above. TARGET DATE FOR ALL STGS: 05/23/18    Status  New      PT SHORT TERM GOAL #2   Title  Pt will improve gait speed to >/=1.8 ft/sec with LRAD to reduce falls risk.     Status  New      PT SHORT TERM GOAL #3   Title  Pt will amb. 300' with LRAD at MOD I level to improve functional mobility.     Status  New      PT SHORT TERM GOAL #4   Title  Pt will improve TUG time to </=20 sec. with LRAD to reduce falls risk.     Status  New      PT SHORT TERM GOAL #5   Title  Pt will improve BERG score to >/=40/56 to decr. falls risk.     Status  New      Additional  Short Term Goals   Additional Short Term Goals  Yes      PT SHORT TERM GOAL #6   Title  Pt will begin aquatic therapy to improve endurance, strength, and balance.     Status  New        PT Long Term Goals - 04/25/18 1355      PT LONG TERM GOAL #1   Title  Pt will verbalize understanding of CVA risk factors and s/s  of CVA to reduce risk of another stroke. TARGET DATE FOR ALL LTGS: 06/20/18    Status  New      PT LONG TERM GOAL #2   Title  Pt will improve TUG time to </=13.5 sec. with LRAD to reduce falls risk.     Status  New      PT LONG TERM GOAL #3   Title  Pt will improve gait speed to >/=2.74ft/sec. with LRAD to safely amb. in the community.     Status  New      PT LONG TERM GOAL #4   Title  Pt will improve BERG score to >/=45/56 to decr. falls risk.     Status  New      PT LONG TERM GOAL #5   Title  Pt will amb. 600' over even/uneven terrain with LRAD at MOD I level to improve functional mobility.     Status  New             Plan - 04/25/18 1348    Clinical Impression Statement  Pt is a pleasant 67y/o male presenting to OPPT neuro s/p R BG CVA with hospitalization from 04/15/18-04/17/18 in acute care: Pt's PMH is significant for the following: HTN, chronic back pain, HLD, visual abnormalities, sleep apnea, PAD, hx of L BG CVA. Pt's gait speed, TUG time, and BERG score all indicate pt is at a high risk for falls. The following deficits were noted upon exam: gait deviations, impaired strength, impaired balance, impaired flexility, postural dysfunction, and impaired L LE sensation.  Pt would benefit from skilled PT to improve safety during functional mobility. PT feels pt would also benefit from aquatic therapy.     History and Personal Factors relevant to plan of care:  Lives with wife and was active prior to stroke    Clinical Presentation  Stable    Clinical Presentation due to:  HTN, chronic back pain, HLD, visual abnormalities, sleep apnea, PAD, hx of L BG CVA     Clinical Decision Making  Low    Rehab Potential  Good    Clinical Impairments Affecting Rehab Potential  see above    PT Frequency  2x / week    PT Duration  8 weeks    PT Treatment/Interventions  ADLs/Self Care Home Management;Biofeedback;Canalith Repostioning;Electrical Stimulation;Therapeutic activities;Functional mobility training;Stair training;Gait training;Orthotic Fit/Training;Therapeutic exercise;Manual techniques;Vestibular;Patient/family education;DME Instruction;Neuromuscular re-education;Balance training aquatic therapy    PT Next Visit Plan  Provided pt with strengthening, flexibility, and balance HEP.     Consulted and Agree with Plan of Care  Patient;Family member/caregiver    Family Member Consulted  wife       Patient will benefit from skilled therapeutic intervention in order to improve the following deficits and impairments:  Abnormal gait, Decreased endurance, Impaired sensation, Decreased strength, Decreased knowledge of use of DME, Impaired UE functional use, Decreased balance, Decreased mobility, Impaired flexibility, Postural dysfunction(PT will not directly treat back pain but will monitor closely)  Visit Diagnosis: Other abnormalities of gait and mobility - Plan: PT plan of care cert/re-cert  Muscle weakness (generalized) - Plan: PT plan of care cert/re-cert  Unsteadiness on feet - Plan: PT plan of care cert/re-cert  Other disturbances of skin sensation - Plan: PT plan of care cert/re-cert     Problem List Patient Active Problem List   Diagnosis Date Noted  . Chronic pain syndrome   . Chronic bilateral low back pain   . Benign essential HTN   . Tobacco abuse   .  Marijuana abuse   . Hyperlipidemia   . History of CVA with residual deficit   . Dysphagia, post-stroke   . ICH (intracerebral hemorrhage) (HCC) - R thalmic/PLIC d/t HTN 82/95/6213  . Insomnia 07/13/2017  . PAD (peripheral artery disease) ([place]) 07/13/2017  . Hyperlipidemia with target LDL  less than 70 07/13/2017  . Stroke-like symptom 09/22/2016  . Paresthesia 09/22/2016  . CVA (cerebral vascular accident) ([place]) 09/22/2016  . Cerebrovascular accident (CVA) due to thrombosis of left carotid artery ([place]) 09/23/2015  . Primary snoring 09/23/2015  . Hypersomnia with sleep apnea 09/23/2015  . Obstructive sleep apnea 09/23/2015  . Lacunar infarct, acute ([place]) 08/25/2015  . Sleep apnea 08/25/2015  . Dysarthria   . CVA (cerebral infarction) 08/02/2015  . Acute hyperglycemia 08/02/2015  . HTN (hypertension) 12/27/2014  . Epistaxis 07/28/2012  . HYPERGLYCEMIA 10/02/2010  . NEPHROLITHIASIS 05/23/2009  . BARRETTS ESOPHAGUS 02/20/2009  . Gastroparesis 02/20/2009  . RADICULOPATHY 10/21/2008  . GERD 10/08/2008  . ESOPHAGITIS 08/15/2008  . ESOPHAGEAL STRICTURE 08/15/2008  . GASTRITIS, ACUTE 08/15/2008  . RENAL CYST 08/14/2008  . TOBACCO ABUSE 08/08/2008  . HEMATURIA, MICROSCOPIC, HX OF 08/08/2008  . PULMONARY NODULE 01/10/2008    Brystol Wasilewski L 04/25/2018, 1:58 PM  [facility] [address] [place], [place], [address] Phone: [phone]   Fax:  [phone]  Name: Richard Vaughn MRN: [id number] Date of Birth: [birth year]  Geoffry Paradise, PT,DPT 04/25/18 1:59 PM Phone: [phone] Fax: [phone]

## 2018-04-25 NOTE — Therapy (Signed)
[facility] [address] [place], [place], [address] Phone: [phone]   Fax:  [phone]  Occupational Therapy Treatment  Patient Details  Name: Richard Vaughn MRN: [id number] Date of Birth: [birth year] Referring Provider: Dr. Erlinda Hong   Encounter Date: 04/25/2018  OT End of Session - 04/25/18 1044    Visit Number  1    Number of Visits  16    Date for OT Re-Evaluation  05/22/18    Authorization Type  [employer] - await insurance visit info as well as approval for aquatic therapy    OT Start Time  0801    OT Stop Time  0850    OT Time Calculation (min)  49 min    Activity Tolerance  Patient tolerated treatment well       Past Medical History:  Diagnosis Date  . Barrett's esophagus   . Chronic back pain   . Gastritis    Mild  . Gastroparesis   . GERD (gastroesophageal reflux disease)   . Headache   . Hyperglycemia   . Nephrolithiasis   . Renal cyst   . Stricture and stenosis of esophagus   . Stroke ([facility]) 07-2015  . Vision abnormalities     Past Surgical History:  Procedure Laterality Date  . [place] SURGERY  2012  . COLONOSCOPY    . [place] SURGERY  2005, 2010   replaced L4 and L5  . UPPER GASTROINTESTINAL ENDOSCOPY      There were no vitals filed for this visit.  Subjective Assessment - 04/25/18 0806    Currently in Pain?  Yes    Pain Score  4     Pain Location  Back    Pain Orientation  Lower    Pain Descriptors / Indicators  Aching    Pain Type  Chronic pain    Pain Onset  More than a month ago    Pain Frequency  Constant    Aggravating Factors   Nothing really I have had this a long time    Pain Relieving Factors  Sitting down, sitting in my recliner         [facility] OT Assessment - 04/25/18 0001      Assessment   Medical Diagnosis  R ICH    Referring Provider  Dr Rosalin Hawking    Onset Date/Surgical Date  04/15/18    Hand Dominance  Right    Prior Therapy  PT OT and ST acute care  only      Precautions   Precautions  Fall      Restrictions   Weight Bearing Restrictions  No      Balance Screen   Has the patient fallen in the past 6 months  No      Home  Environment   Family/patient expects to be discharged to:  Private residence    Living Arrangements  Spouse/significant other grandchildren 94 and 81    Available Help at Discharge  Available 24 hours/day    Type of [place]  One level but has finished basement he wants to use    Solicitor  Handicapped height    Additional Comments  RW, shower seat with back, no grab bars, uses sink counter to stand.  Pt stands at times in shower      Prior Function   Level of Independence  Independent    Vocation  Unemployed    Leisure  going to water fitness at [employer].        ADL   Eating/Feeding  Modified independent pt states sometimes he dribbles/no appetite    Grooming  Modified independent pt has not yet shaved    Upper Body Bathing  Minimal assistance helps with back/supervision for safety    Lower Body Bathing  Supervision/safety    Upper Body Dressing  Minimal assistance buttons    Lower Body Dressing  Minimal assistance tying shoes    Toilet Transfer  Modified independent    Toileting - Clothing Manipulation  Modified independent    Toileting -  Hygiene  Modified Independent    Tub/Shower Transfer  Min guard steadying assist;pt uses wall to stabilize      IADL   Shopping  Needs to be accompanied on any shopping trip    Light Housekeeping  Performs light daily tasks such as dishwashing, bed making    Meal Prep  Needs to have meals prepared and served    [employer] on family or friends for transportation    Medication Management  Is responsible for taking medication in correct dosages at correct time    Psychiatrist financial matters independently (budgets, writes checks, pays rent, bills goes to bank), collects  and keeps track of income he and wife do together and did before      Mobility   Mobility Status  Needs assist    Mobility Status Comments  Pt has tendency to "catch" L foot when ambulating with RW.  Recommend supervision when outside.       Written Expression   Dominant Hand  Right      Vision - History   Baseline Vision  Wears glasses for distance only wears mostly for driving    Additional Comments  Pt denies any visual changes.      Vision Assessment   Comment  Pt had nausea initially but reports this has resolved.        Activity Tolerance   Activity Tolerance  Tolerates 30 min activity with multiple rests    Activity Tolerance Comments  Pt reports he is sleeping more than usual.        Cognition   Overall Cognitive Status  Within Functional Limits for tasks assessed    Mini Mental State Exam   Will continue to monitor as pt demonstrated attention deficits in acute care.  Pt able to attend to this clinician in very busy environment.     Cognition Comments  Pt and wife report pt is very independent by nature so has done some things that are not particularly safe however wife feels this is pt's personality vs true cognitive deficit. WIll continue to monitor.        Sensation   Light Touch  Impaired by gross assessment dulled LUE/Face    Hot/Cold  Impaired by gross assessment LUE/face    Proprioception  Appears Intact LUE      Coordination   Gross Motor Movements are Fluid and Coordinated  No    Fine Motor Movements are Fluid and Coordinated  No moderate ataxia    Finger Nose Finger Test  moderately impaired.      9 Hole Peg Test  Right;Left    Right 9 Hole Peg Test  23.43    Left 9 Hole Peg Test  50.30    Box and Blocks  29    Other  Pt also with motor  planning defcits as well       Praxis   Praxis  Impaired    Praxis-Other Comments  To be further assessed via functional tasks      Tone   Assessment Location  Left Upper Extremity      ROM / Strength   AROM / PROM /  Strength  AROM;Strength      AROM   Overall AROM   Within functional limits for tasks performed    Overall AROM Comments  BUE's      Strength   Overall Strength  Within functional limits for tasks performed    Overall Strength Comments  BUE's      Hand Function   Right Hand Gross Grasp  Functional    Right Hand Grip (lbs)  120    Left Hand Gross Grasp  Functional    Left Hand Grip (lbs)  112      LUE Tone   LUE Tone  Within Functional Limits                         OT Short Term Goals - 04/25/18 0858      OT SHORT TERM GOAL #1   Title  Pt will be mod I with HEP for coordination for LUE, balance -goals due  05/23/2018    Status  New      OT SHORT TERM GOAL #2   Title  Pt will be mod I with tying shoes    Status  New      OT SHORT TERM GOAL #3   Title  Pt will demonstrate improved coordination as evidenced by decreasing time on 9 hole peg with LUE by at least 7 seven seconds to assist with functional fine motor tasks.     Baseline  baseline= 50.30    Status  New      OT SHORT TERM GOAL #4   Title  Pt will be mod I with shaving    Status  New      OT SHORT TERM GOAL #5   Title  Pt will be supervision with shower transfers    Status  New      OT SHORT TERM GOAL #6   Title  Pt will be mod I with simple snack/cold meal prep     Status  New      OT SHORT TERM GOAL #7   Title  Pt will be mod I with loading dishwasher/washing dishes    Status  New        OT Long Term Goals - 04/25/18 1027      OT LONG TERM GOAL #1   Title  Pt will be mod I with upgraded HEP - goals due 06/20/2018    Status  New      OT LONG TERM GOAL #2   Title  Pt will be mod I with shower transfers    Status  New      OT LONG TERM GOAL #3   Title  Pt will be mod I with hot meal prep with at least two items.    Status  New      OT LONG TERM GOAL #4   Title  Pt demonstrate improved coordination as evidenced by decreasing time on 9 hole peg by at least 10 seconds with LUE.      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     Baseline  baseline = 29    Status  New      OT LONG TERM GOAL #7   Title  Pt will return to water fitness class at [place] #8   Title  Pt will be mod I with familiar home mgmt tasks    Status  New            Plan - 04/25/18 1031    Clinical Impression Statement  Pt is a 67 year old male s/p R BG CVA on 04/15/2018. Pt discharged home on 04/17/3018 with wife.  Pt has h/o of L BG stroke with mild residual deficits.  Pt presents today with the following impairments that impact indepdendent functioning:  ataxia, impaired sensation LUE, impaired LUE functional use, impaired motor planning, decreased balance, impaired fine motor coordination, decreased activity tolerance. Pt will benefit from skilled OT to address these areas and maximize return to independence.      Occupational Profile and client history currently impacting functional performance  PMH:  L BG CVA, chronic low back pain, HLD, Barrents esophagus, dysphagia, HTN, sleep apnea,     Occupational performance deficits (Please refer to evaluation for details):  ADL's;IADL's;Rest and Sleep;Leisure;Social Participation    Rehab Potential  Good    Current Impairments/barriers affecting progress:  will monitor cognition    OT Frequency  2x / week    OT Duration  8 weeks    OT Treatment/Interventions  Self-care/ADL training;Aquatic Therapy;Moist Heat;Electrical Stimulation;Fluidtherapy;DME and/or AE instruction;Neuromuscular education;Therapeutic exercise;Functional Mobility Training;Manual Therapy;Passive range of  motion;Therapeutic activities;Patient/family education;Balance training    Plan  share goals and POC, HEP for coordination, NMR for balance, motor planning , functional use of LUE    Clinical Decision Making  Multiple treatment options, significant modification of task necessary    Consulted and Agree with Plan of Care  Patient;Family member/caregiver    Family Member Consulted  wife [name]       Patient will benefit from skilled therapeutic intervention in order to improve the following deficits and impairments:  Abnormal gait, Decreased activity tolerance, Decreased balance, Decreased coordination, Decreased mobility, Difficulty walking, Impaired UE functional use, Impaired sensation  Visit Diagnosis: Ataxia - Plan: Ot plan of care cert/re-cert  Other symptoms and signs involving the nervous system - Plan: Ot plan of care cert/re-cert  Unsteadiness on feet - Plan: Ot plan of care cert/re-cert  Abnormal posture - Plan: Ot plan of care cert/re-cert    Problem List Patient Active Problem List   Diagnosis Date Noted  . Chronic pain syndrome   . Chronic bilateral low back pain   . Benign essential HTN   . Tobacco abuse   . Marijuana abuse   . Hyperlipidemia   . History of CVA with residual deficit   . Dysphagia, post-stroke   . ICH (intracerebral hemorrhage) ([facility]) - R thalmic/PLIC d/t HTN 82/99/3716  . Insomnia 07/13/2017  . PAD (peripheral artery disease) ([place]) 07/13/2017  . Hyperlipidemia with target LDL less than 70 07/13/2017  .  Stroke-like symptom 09/22/2016  . Paresthesia 09/22/2016  . CVA (cerebral vascular accident) ([place]) 09/22/2016  . Cerebrovascular accident (CVA) due to thrombosis of left carotid artery ([place]) 09/23/2015  . Primary snoring 09/23/2015  . Hypersomnia with sleep apnea 09/23/2015  . Obstructive sleep apnea 09/23/2015  . Lacunar infarct, acute ([place]) 08/25/2015  . Sleep apnea 08/25/2015  . Dysarthria   . CVA (cerebral infarction) 08/02/2015  . Acute  hyperglycemia 08/02/2015  . HTN (hypertension) 12/27/2014  . Epistaxis 07/28/2012  . HYPERGLYCEMIA 10/02/2010  . NEPHROLITHIASIS 05/23/2009  . BARRETTS ESOPHAGUS 02/20/2009  . Gastroparesis 02/20/2009  . RADICULOPATHY 10/21/2008  . GERD 10/08/2008  . ESOPHAGITIS 08/15/2008  . ESOPHAGEAL STRICTURE 08/15/2008  . GASTRITIS, ACUTE 08/15/2008  . RENAL CYST 08/14/2008  . TOBACCO ABUSE 08/08/2008  . HEMATURIA, MICROSCOPIC, HX OF 08/08/2008  . PULMONARY NODULE 01/10/2008    Quay Burow, OTR/L 04/25/2018, 10:48 AM  [facility] [address] [place], [place], [address] Phone: [phone]   Fax:  [phone]  Name: Richard Vaughn MRN: [id number] Date of Birth: 11-05-1951

## 2018-04-26 ENCOUNTER — Ambulatory Visit: Payer: Federal, State, Local not specified - PPO | Admitting: Neurology

## 2018-04-26 ENCOUNTER — Encounter: Payer: Self-pay | Admitting: Neurology

## 2018-04-26 ENCOUNTER — Ambulatory Visit: Payer: Federal, State, Local not specified - PPO

## 2018-04-26 VITALS — BP 128/89 | HR 62 | Ht 74.0 in | Wt 258.0 lb

## 2018-04-26 DIAGNOSIS — Z79891 Long term (current) use of opiate analgesic: Secondary | ICD-10-CM | POA: Diagnosis not present

## 2018-04-26 DIAGNOSIS — R471 Dysarthria and anarthria: Secondary | ICD-10-CM

## 2018-04-26 DIAGNOSIS — G4734 Idiopathic sleep related nonobstructive alveolar hypoventilation: Secondary | ICD-10-CM

## 2018-04-26 DIAGNOSIS — I10 Essential (primary) hypertension: Secondary | ICD-10-CM

## 2018-04-26 DIAGNOSIS — I619 Nontraumatic intracerebral hemorrhage, unspecified: Secondary | ICD-10-CM

## 2018-04-26 DIAGNOSIS — R2689 Other abnormalities of gait and mobility: Secondary | ICD-10-CM | POA: Diagnosis not present

## 2018-04-26 DIAGNOSIS — R29818 Other symptoms and signs involving the nervous system: Secondary | ICD-10-CM | POA: Diagnosis not present

## 2018-04-26 DIAGNOSIS — R270 Ataxia, unspecified: Secondary | ICD-10-CM | POA: Diagnosis not present

## 2018-04-26 DIAGNOSIS — R208 Other disturbances of skin sensation: Secondary | ICD-10-CM | POA: Diagnosis not present

## 2018-04-26 DIAGNOSIS — R293 Abnormal posture: Secondary | ICD-10-CM | POA: Diagnosis not present

## 2018-04-26 DIAGNOSIS — R2681 Unsteadiness on feet: Secondary | ICD-10-CM | POA: Diagnosis not present

## 2018-04-26 DIAGNOSIS — M6281 Muscle weakness (generalized): Secondary | ICD-10-CM | POA: Diagnosis not present

## 2018-04-26 NOTE — Progress Notes (Signed)
SLEEP MEDICINE CLINIC   Provider:  Larey Seat, M D  Primary Care Physician:  Martinique, [name], MD   Referring Provider: Dr Grayland Ormond and his  primary neurologist is Dr Leonie Man, MD    Chief Complaint  Patient presents with  . New Patient (Initial Visit)    pt with wife, rm 55. pt was recently hospitalized with a new stroke. pt had a sleep study in 2017 and was suppose to come back  for a cpap titration test. pt states he wasnt sure why that was necessary so never had it completed.     HPI:  Richard Vaughn is a 67 y.o. male patient , seen here in a referral  from Dr. Shawna Orleans, Dr. Erlinda Hong, and  Dr Leonie Man, MD. I have the pleasure of meeting today with Richard Vaughn, and meanwhile 67 year old gentleman that has been treated for strokes at [facility] in 2017, and had followed up with Dr. Leonie Man-  At the time he had been referred for a sleep study and a baseline polysomnography have been performed this was on 05 October 2016 . He had suffered a stroke on 02 August 2015 before which left him with mild right dominant weakness clumsiness and hesitation in speech.  His wife had noted him to snore and have apneas at the time he was still struggling to stop smoking.  He has been sleepier than usual since her stroke in 2016 but had recovered very well in terms of motor function.  History of GERD, gastroparesis without diabetes, renal cyst, renal stones, back pain headache stroke esophagitis and gastritis have been mentioned.  His BMI at the time was 35.1, his AHI was 18.7 and mild to moderate degree of sleep apnea which was not exacerbated by snoring.  He slept poorly in the lab only 52% of the recorded time was in sleep.  His sleep apnea was strictly supine dependent.  While the patient slept on his back he suffered nearly all apneas of the night when not on his back his AHI was 3.4.  He did not have prolonged desaturations and he did not have nocturia.  He had primary central sleep apnea which could have been  induced by narcotic pain medication at the time.  Since his sleep apnea was exacerbated when on his back the main treatment for him would be to sleep on his side.  CPAP was not the primary recommendation.  Unfortunately, the patient presented again with stroke symptoms of slurred speech and left-sided weakness, on 15 April 2018, less than 14 days ago.  He had a prior left basal ganglia stroke but these stroke symptoms affected the previous sleep better while working side.  Sudden onset of numbness affecting his lips slurring of speech and left-sided body weakness was noted.  EMS was called he was brought to [facility], he arrived at 12:45 AM on 15 April 2018.  He was to work awake and alert, the NIH stroke scale was 1 for sensory 1 for left arm 1 for left leg 2 for dysarthria total point of 5.  Had a normal CBC with differential, a normal metabolic panel, ( glucose was high but the patient was not fasting).  Creatinine was normal.  Noncontrast CT of the head the same night showed a right basal ganglia bleed etiology most likely due to hypertension.  It was a nontraumatic hemorrhage.  The patient was discharged on 3 June by Dr. Collie Siad, he was asked to stop aspirin and start  taking Nexium Benicar Opana had been continued, Viagra  wasnot discontinued. Trazodone has been continued.  Upon discharge his lipid panel shows very high triglycerides, the low level of good cholesterol HDL was only 20, total cholesterol however was not elevated at 112.  Carotid Doppler 30 Myotte bilateral ICA stenosis, vertebral flow was antegrade and intact, 2D echocardiogram on 2 June showed an ejection fraction between 65 and 70% excellent wall motion.  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  The patient had been placed on oxygen especially at night during his hospitalization hypoxemia and witnessed apnea with a  reason to have him evaluated now.  His stroke neurologist , Dr Erlinda Hong, also wanted him to finally quit smoking.   Chief complaint according to patient : " My wife is worried about my breathing in sleep"  Sleep habits are as follows: The patient's usual bedtime is between midnight and 1 AM.  That is when he retreats to his bedroom, he is asleep within 5 to 10 minutes.  He prefers to fall asleep on his side, on 2 pillows.  His wife reports that she wakes up because he puts the TV on in the bedroom.  The marital bedroom is described as cool, but neither quiet nor dark. He stays asleep for about 4 to 5 hours before waking up for bathroom break, another 2 hours later. He ralls no dreams, is not enacting any dreams. He wakes up spontaneously around 8 AM, estimated average sleep time  Is 6-7 hours.    Sleep medical history and family sleep history: see above . sister has OSA.  No tonsillectomy , no nasal surgery, he had nasal septum deviation due to 2 nasal fracture. .   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 Still active. 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 Caffeine -5 iced teas a day, "decaff" , no soda, and not Coffee.  ETOH   Review of Systems: Out of a complete 14 system review, the patient complains of only the following symptoms, and all other reviewed systems are negative.  Snoring, hypoxemia, witnessed apnea.   Epworth score 4/ 24  , Fatigue severity score 24   , depression score 8/ 15 - positive for depression.    Social History   Socioeconomic History  . Marital status: Married    Spouse name: Not on file  . Number of children: 3  . Years of education: Not on file  . Highest education level: Not on file  Occupational History  . Occupation: retired    Fish farm manager: [employer]  Social Needs  . Financial resource strain: Not on file  . Food insecurity:    Worry: Not on file    Inability: Not on file  . Transportation needs:    Medical: Not on file      Non-medical: Not on file  Tobacco Use  . Smoking status: Former Smoker    Packs/day: 0.50    Years: 30.00    Pack years: 15.00    Types: Cigarettes    Last attempt to quit: 04/15/2018    Years since quitting: 0.0  . Smokeless tobacco: Never Used  Substance and Sexual Activity  . Alcohol use: No    Alcohol/week: 0.0 oz  . Drug use: Yes    Types: Oxycodone, Morphine    Comment: Opana daily for chronic  back pain  .  Sexual activity: Yes  Lifestyle  . Physical activity:    Days per week: Not on file    Minutes per session: Not on file  . Stress: Not on file  Relationships  . Social connections:    Talks on phone: Not on file    Gets together: Not on file    Attends religious service: Not on file    Active member of club or organization: Not on file    Attends meetings of clubs or organizations: Not on file    Relationship status: Not on file  . Intimate partner violence:    Fear of current or ex partner: Not on file    Emotionally abused: Not on file    Physically abused: Not on file    Forced sexual activity: Not on file  Other Topics Concern  . Not on file  Social History Narrative  . Not on file    Family History  Problem Relation Age of Onset  . Hypertension Father   . Stroke Father   . Hypertension Mother   . Liver cancer Mother   . Hypertension Sister        2 other sisters has also  . Stroke Sister   . Stroke Sister   . Hypertension Brother        2nd brother has also  . Colon cancer Neg Hx   . Esophageal cancer Neg Hx   . Rectal cancer Neg Hx   . Stomach cancer Neg Hx   . Colon polyps Neg Hx     Past Medical History:  Diagnosis Date  . Barrett's esophagus   . Chronic back pain   . Gastritis    Mild  . Gastroparesis   . GERD (gastroesophageal reflux disease)   . Headache   . Hyperglycemia   . Nephrolithiasis   . Renal cyst   . Stricture and stenosis of esophagus   . Stroke ([place]) 07-2015  . Vision abnormalities     Past Surgical History:   Procedure Laterality Date  . [place] SURGERY  2012  . COLONOSCOPY    . [place] SURGERY  2005, 2010   replaced L4 and L5  . UPPER GASTROINTESTINAL ENDOSCOPY      Current Outpatient Medications  Medication Sig Dispense Refill  . NEXIUM 40 MG capsule TAKE 1 CAPSULE DAILY AT 12 NOON. 90 capsule 3  . olmesartan (BENICAR) 20 MG tablet TAKE 1 TABLET BY MOUTH EVERY DAY 30 tablet 0  . oxymorphone (OPANA) 10 MG tablet Take 10 mg by mouth every 6 (six) hours as needed for pain.     . traZODone (DESYREL) 50 MG tablet TAKE 1 TABLET AT BEDTIME 30 tablet 0   No current facility-administered medications for this visit.     Allergies as of 04/26/2018  . (No Known Allergies)    Vitals: BP 128/89   Pulse 62   Ht 6\' 2"  (1.88 m)   Wt 258 lb (117 kg)   BMI 33.13 kg/m  Last Weight:  Wt Readings from Last 1 Encounters:  04/26/18 258 lb (117 kg)   TJQ:ZESP mass index is 33.13 kg/m.     Last Height:   Ht Readings from Last 1 Encounters:  04/26/18 6\' 2"  (1.88 m)    Physical exam:  General: The patient is awake, alert and appears not in acute distress. The patient is well groomed. Head: Normocephalic, atraumatic. Neck is supple. Mallampati 5  neck circumference:17.5 . Nasal airflow patent ,  Retrognathia  is not seen.  Cardiovascular:  Regular rate and rhythm , without  murmurs or carotid bruit, and without distended neck veins. Respiratory: Lungs are clear to auscultation. Skin:  Without evidence of edema, or rash Trunk: BMI is 33. Neurologic exam : The patient is awake and alert, oriented to place and time.     Cranial nerves: Intact smell, but lost taste since last stroke ! Pupils are equal and briskly reactive to light.  Extraocular movements  in vertical and horizontal planes intact and without nystagmus. Visual fields by finger perimetry are intact. Hearing to finger rub intact.   Facial sensation intact to fine touch. Facial motor strength is symmetric and tongue and uvula  move midline. Shoulder shrug was symmetrical.   Motor exam: abnormal tone, muscle bulk -  Softer tone on upper right extremity and more rigidity over left biceps. Left grip weaker .  Sensory:  Deferred Coordination: deferred Gait and station: Patient walks with a walker as assistive device .Deep tendon reflexes: brisk on the left, up going toe bilaterally.    Assessment:  After physical and neurologic examination, review of laboratory studies,  Personal review of imaging studies, reports of other /same  Imaging studies, results of polysomnography and / or neurophysiology testing and pre-existing records as far as provided in visit., my assessment is   1) Richard Vaughn is a 67 year old gentleman who had suffered a stroke about 3 years ago and now suffered a brain hemorrhage, attributed to hypertension but also to arteriosclerosis.  His risk factors besides hypertension were ongoing tobacco use / nicotine. During his hospitalization from the first through 17 April 2018 he was observed with hypoxemia at night, he had been given nasal cannula oxygen during his telemetry stay, and the nurse noted also that he had prolonged apneic pauses.  At that time he probably slept on his back.  At home he sleeps on his side in the past this has been reducing his overall apnea index significantly.   When he was last evaluated by a polysomnography he had no significant apnea when in lateral sleep position.  With his new stroke this may have significantly changed.  It has affected his speech somewhat his soft palate muscle tone, and has a left and also more gait impaired.  Hypertension can be maintained by untreated obstructive sleep apnea and we will therefore order an attended sleep study for an acute stroke patient.  The study will be split at an AHI of 20, with 4% desaturation.    The patient was advised of the nature of the diagnosed disorder , the treatment options and the  risks for general health and wellness arising  from not treating the condition.   I spent more than 50 minutes of face to face time with the patient.  Greater than 50% of time was spent in counseling and coordination of care. We have discussed the diagnosis and differential and I answered the patient's questions.    Plan:  Treatment plan and additional workup : SPLIT night attended PSG, 4% desat AHI 20. Get supine sleep.   Follow up with MD and later with Venancio Poisson, stroke NP.  Larey Seat, MD 6/76/7209, 47:09 AM  Certified in Neurology by ABPN Certified in [place] by [facility] [address] [place], [place] [address]

## 2018-04-28 ENCOUNTER — Ambulatory Visit: Payer: Federal, State, Local not specified - PPO | Admitting: Family Medicine

## 2018-04-28 ENCOUNTER — Encounter: Payer: Self-pay | Admitting: Family Medicine

## 2018-04-28 VITALS — BP 126/88 | HR 60 | Temp 98.5°F | Resp 12 | Ht 74.0 in | Wt 261.0 lb

## 2018-04-28 DIAGNOSIS — G4730 Sleep apnea, unspecified: Secondary | ICD-10-CM | POA: Diagnosis not present

## 2018-04-28 DIAGNOSIS — F17200 Nicotine dependence, unspecified, uncomplicated: Secondary | ICD-10-CM

## 2018-04-28 DIAGNOSIS — G4700 Insomnia, unspecified: Secondary | ICD-10-CM | POA: Diagnosis not present

## 2018-04-28 DIAGNOSIS — I610 Nontraumatic intracerebral hemorrhage in hemisphere, subcortical: Secondary | ICD-10-CM | POA: Diagnosis not present

## 2018-04-28 DIAGNOSIS — I674 Hypertensive encephalopathy: Secondary | ICD-10-CM | POA: Diagnosis not present

## 2018-04-28 DIAGNOSIS — E781 Pure hyperglyceridemia: Secondary | ICD-10-CM

## 2018-04-28 MED ORDER — TRAZODONE HCL 50 MG PO TABS: 50.0000 mg | ORAL_TABLET | Freq: Every day | ORAL | 1 refills | Status: DC

## 2018-04-28 MED ORDER — NICOTINE 21 MG/24HR TD PT24: 21.0000 mg | MEDICATED_PATCH | Freq: Every day | TRANSDERMAL | 1 refills | Status: DC

## 2018-04-28 MED ORDER — FENOFIBRATE 145 MG PO TABS: 145.0000 mg | ORAL_TABLET | Freq: Every day | ORAL | 3 refills | Status: DC

## 2018-04-28 MED ORDER — OLMESARTAN MEDOXOMIL 20 MG PO TABS
20.0000 mg | ORAL_TABLET | Freq: Every day | ORAL | 1 refills | Status: DC
Start: 2018-04-28 — End: 2018-04-28

## 2018-04-28 MED ORDER — OLMESARTAN MEDOXOMIL 20 MG PO TABS: 20.0000 mg | ORAL_TABLET | Freq: Every day | ORAL | 1 refills | Status: DC

## 2018-04-28 NOTE — Patient Instructions (Addendum)
A few things to remember from today's visit:   Nontraumatic subcortical hemorrhage of right cerebral hemisphere ([place])  TOBACCO ABUSE  Insomnia, unspecified type - Plan: traZODone (DESYREL) 50 MG tablet  Benign essential HTN  Tobacco abuse - Plan: nicotine (NICODERM CQ - DOSED IN MG/24 HOURS) 21 mg/24hr patch   Please be sure medication list is accurate. If a new problem present, please set up appointment sooner than planned today.

## 2018-04-28 NOTE — Assessment & Plan Note (Signed)
He has appt with Dr Brett Fairy.

## 2018-04-28 NOTE — Assessment & Plan Note (Signed)
Adequately controlled based on home BP readings. DBP slightly above goal.  No changes in current management. DASH-low salt diet recommended.  F/U in 2 months, before if needed.

## 2018-04-28 NOTE — Therapy (Signed)
[facility] [address] [place], [place], [address] Phone: [phone]   Fax:  [phone]  Speech Language Pathology Evaluation  Patient Details  Name: Richard Vaughn MRN: [id number] Date of Birth: Jul 15, 1951 Referring Provider: Rosalin Hawking, MD   Encounter Date: 04/26/2018  End of Session - 04/28/18 1308    Visit Number  1    Number of Visits  17    Date for SLP Re-Evaluation  08/04/18 90 days    SLP Start Time  2542    SLP Stop Time   1400    SLP Time Calculation (min)  43 min    Activity Tolerance  Patient tolerated treatment well       Past Medical History:  Diagnosis Date  . Barrett's esophagus   . Chronic back pain   . Gastritis    Mild  . Gastroparesis   . GERD (gastroesophageal reflux disease)   . Headache   . Hyperglycemia   . Nephrolithiasis   . Renal cyst   . Stricture and stenosis of esophagus   . Stroke ([place]) 07-2015  . Vision abnormalities     Past Surgical History:  Procedure Laterality Date  . [place] SURGERY  2012  . COLONOSCOPY    . [place] SURGERY  2005, 2010   replaced L4 and L5  . UPPER GASTROINTESTINAL ENDOSCOPY      There were no vitals filed for this visit.      SLP Evaluation [facility] - 04/28/18 0001      SLP Visit Information   SLP Received On  04/26/18    Referring Provider  Rosalin Hawking, MD    Onset Date  04-15-18    Medical Diagnosis  Rt ICH      Subjective   Subjective  "IT feels like my words won't come forth."      Pain Assessment   Pain Onset  More than a month ago      General Information   HPI  Pt known from course of therapy in October 2016. Pt with mild residual dysarthria but compensating well. Pt and wife deny any cognitive changes persisting from CVA. Pt is managing his own medications at home.       Prior Functional Status   Cognitive/Linguistic Baseline  Within functional limits      Cognition   Overall Cognitive Status  Within  Functional Limits for tasks assessed      Auditory Comprehension   Overall Auditory Comprehension  Appears within functional limits for tasks assessed      Verbal Expression   Overall Verbal Expression  Appears within functional limits for tasks assessed      Oral Motor/Sensory Function   Overall Oral Motor/Sensory Function  Impaired    Labial ROM  Within Functional Limits    Labial Strength  Reduced Left    Labial Sensation  Reduced Left    Labial Coordination  Reduced    Lingual ROM  Reduced left    Lingual Symmetry  Abnormal symmetry left slight    Lingual Strength  Reduced Left    Lingual Coordination  Reduced    Facial Sensation  Reduced lt numbness     Velum  -- weak on lt slightly      Motor Speech   Overall Motor Speech  Impaired    Phonation  Other (comment);Low vocal intensity strained/strangled intermittently    Resonance  Hypernasality    Articulation  Impaired    Level of  Impairment  Sentence    Intelligibility  Intelligibility reduced 2-sentences:75%; conversation:85% (context helped)    Word  --    Effective Techniques  Increased vocal intensity 90dB average with "HEY!"    Phonation  Impaired    Vocal Abuses  -- vocal tremor with WNL sust /a/ time; vocal "arrest"                      SLP Education - 04/28/18 1137    Education Details  results from evaluation, possible goals, reviewed dysarthria compensations (SLOP)    Person(s) Educated  Patient;Spouse    Methods  Explanation;Demonstration;Verbal cues    Comprehension  Verbal cues required;Returned demonstration;Need further instruction;Verbalized understanding       SLP Short Term Goals - 04/28/18 1304      SLP SHORT TERM GOAL #1   Title  pt will complete HEP with occasional min A     Time  4    Period  Weeks    Status  New      SLP SHORT TERM GOAL #2   Title  pt will demo compensations for dysarthria in mod complex sentence responses 90%     Time  4    Period  Weeks    Status  New       SLP SHORT TERM GOAL #3   Title  pt will produce 100% intelligible speech in 8 minutes simple conversation with modified independence (compensations)    Time  4    Period  Weeks    Status  New       SLP Long Term Goals - 04/28/18 1305      SLP LONG TERM GOAL #1   Title  pt will complete HEP for dysarthria with rare min A over three consecutive sessions    Time  8    Period  Weeks    Status  New      SLP LONG TERM GOAL #2   Title  pt will produce 10 minutes intelligible mod complex conversation using compensations, with rare min A over 3 consecutive sessions    Time  8    Period  Weeks    Status  New      SLP LONG TERM GOAL #3   Title  pt will demo speech volume in 10 minutes simple-mod complex conversation, in low 70s dB average over three consecutive sessions    Time  8    Period  Weeks    Status  New       Plan - 04/28/18 1137    Clinical Impression Statement  Pt known to this SLP from previous therapy course in which pt did very well. He presents today wiht mild-mod dysarthria, worse when fatigued. Voice is softer than WNL in conversation (mid-upper 60s dB average) however pt with WNL loudness upon max loudness testing, at 90+ dB average. During max phonation time testing SLP heard a vocal tremor that may indicate vocal fold weakness. SLP also heard intermittent slight vocal arrest/strained and strangled vocal quality. Pt and wife report any cognitive deficits pt may have experienced initially have resolved. SLP to closely monitor and assess and/or add goals PRN. Pt would benefit from skilled ST to review and use dysarthria compensations as well as exercises to strengthen oral musculature.     Speech Therapy Frequency  2x / week    Duration  -- 8 weeks or 17 total visits     Treatment/Interventions  Oral motor exercises;SLP instruction  and feedback;Compensatory strategies;Patient/family education;Environmental controls;Cognitive reorganization;Functional tasks;Cueing  hierarchy;Internal/external aids    Potential to Achieve Goals  Good    Consulted and Agree with Plan of Care  Patient       Patient will benefit from skilled therapeutic intervention in order to improve the following deficits and impairments:   Dysarthria and anarthria    Problem List Patient Active Problem List   Diagnosis Date Noted  . Chronic pain syndrome   . Chronic bilateral low back pain   . Benign essential HTN   . Tobacco abuse   . Marijuana abuse   . Hyperlipidemia   . History of CVA with residual deficit   . Dysphagia, post-stroke   . ICH (intracerebral hemorrhage) ([facility]) - R thalmic/PLIC d/t HTN 23/30/0762  . Insomnia 07/13/2017  . PAD (peripheral artery disease) ([place]) 07/13/2017  . Hyperlipidemia with target LDL less than 70 07/13/2017  . Stroke-like symptom 09/22/2016  . Paresthesia 09/22/2016  . CVA (cerebral vascular accident) ([place]) 09/22/2016  . Cerebrovascular accident (CVA) due to thrombosis of left carotid artery ([place]) 09/23/2015  . Primary snoring 09/23/2015  . Hypersomnia with sleep apnea 09/23/2015  . Obstructive sleep apnea 09/23/2015  . Lacunar infarct, acute ([place]) 08/25/2015  . Sleep apnea 08/25/2015  . Dysarthria   . CVA (cerebral infarction) 08/02/2015  . Acute hyperglycemia 08/02/2015  . HTN (hypertension) 12/27/2014  . Epistaxis 07/28/2012  . HYPERGLYCEMIA 10/02/2010  . NEPHROLITHIASIS 05/23/2009  . BARRETTS ESOPHAGUS 02/20/2009  . Gastroparesis 02/20/2009  . RADICULOPATHY 10/21/2008  . GERD 10/08/2008  . ESOPHAGITIS 08/15/2008  . ESOPHAGEAL STRICTURE 08/15/2008  . GASTRITIS, ACUTE 08/15/2008  . RENAL CYST 08/14/2008  . TOBACCO ABUSE 08/08/2008  . HEMATURIA, MICROSCOPIC, HX OF 08/08/2008  . PULMONARY NODULE 01/10/2008    [facility] ,[place], [place]  04/28/2018, 1:09 PM  [facility] [address] [place], [place], [address] Phone: [phone]   Fax:   [phone]  Name: JARET COPPEDGE MRN: [id number] Date of Birth: 05-28-1951

## 2018-04-28 NOTE — Progress Notes (Signed)
HPI:   Richard Vaughn is a 67 y.o. male, who is here today with his son to follow on recent hospitalization.  Admitted on 04/15/18 and discharged 04/17/18. THC 04/20/18.  On 04/15/18 he developed sudden episode of lips numbness,left-sided weakness,and slurred speech. 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  Head CT 04/15/18:  1. Acute hemorrhage within the right thalamus measuring up to 1.5 cm, 1.3 cc. Mild associated edema and mass effect. 2. Stable advanced chronic microvascular ischemic changes of the brain and mild brain parenchymal volume loss. 3. ASPECTS is not applicable.   Lab Results  Component Value Date   CREATININE 1.07 04/17/2018   BUN 11 04/17/2018   NA 139 04/17/2018   K 3.7 04/17/2018   CL 106 04/17/2018   CO2 24 04/17/2018     Lab Results  Component Value Date   WBC 9.0 04/17/2018   HGB 14.8 04/17/2018   HCT 43.6 04/17/2018   MCV 89.0 04/17/2018   PLT 168 04/17/2018   2D Echo: LE EF 65-70% Carotid Doppler: 1-39% bilateral ICA stenosis.  UDS positive for Tetrahydrocannabinol.  Hx of tobacco use,he has not smoked since hospital admission. He is interested in smoking cessation. He would like to try Nicotine patches.  He is doing out pt PT and progressively getting better. He has had 3 sections so far.  He has a walker to help with transfer. His wife is helping him with bath and dressing.  He has neuro appt arranged. Also appt with Dr Brett Fairy to evaluated for OSA.   HLD:  He is on non pharmacologic treatment.  Lab Results  Component Value Date   CHOL 112 04/15/2018   HDL 20 (L) 04/15/2018   LDLCALC 27 04/15/2018   TRIG 327 (H) 04/15/2018   CHOLHDL 5.6 04/15/2018   HTN:  He is on Benicar 20 mg daily. He is tolerating medication well. He is checking BP at home: 110's-120's/70-80's.  Negative for severe/frequent headache, visual changes, chest pain, dyspnea, palpitation, or edema.  Insomnia: On  Trazodone 50 mg daily,which is still helping. No side effects reported.  Sleeping about 5-6 hours.   Review of Systems  Constitutional: Positive for activity change and fatigue. Negative for appetite change and fever.  HENT: Negative for nosebleeds, sore throat and trouble swallowing.   Eyes: Negative for redness and visual disturbance.  Respiratory: Negative for cough, shortness of breath and wheezing.   Cardiovascular: Negative for chest pain, palpitations and leg swelling.  Gastrointestinal: Negative for abdominal pain, nausea and vomiting.       No changes in bowel habits.  Genitourinary: Negative for decreased urine volume, dysuria and hematuria.  Musculoskeletal: Positive for back pain and gait problem.  Skin: Negative for rash.  Neurological: Negative for syncope and headaches.  Psychiatric/Behavioral: Positive for sleep disturbance. Negative for confusion.      Current Outpatient Medications on File Prior to Visit  Medication Sig Dispense Refill  . NEXIUM 40 MG capsule TAKE 1 CAPSULE DAILY AT 12 NOON. 90 capsule 3  . oxymorphone (OPANA) 10 MG tablet Take 10 mg by mouth every 6 (six) hours as needed for pain.      No current facility-administered medications on file prior to visit.      Past Medical History:  Diagnosis Date  . Barrett's esophagus   . Chronic back pain   . Gastritis    Mild  . Gastroparesis   . GERD (gastroesophageal reflux disease)   .  Headache   . Hyperglycemia   . Nephrolithiasis   . Renal cyst   . Stricture and stenosis of esophagus   . Stroke ([place]) 07-2015  . Vision abnormalities    No Known Allergies  Social History   Socioeconomic History  . Marital status: Married    Spouse name: Not on file  . Number of children: 3  . Years of education: Not on file  . Highest education level: Not on file  Occupational History  . Occupation: retired    Fish farm manager: [employer]  Social Needs  . Financial resource strain: Not on file  . Food  insecurity:    Worry: Not on file    Inability: Not on file  . Transportation needs:    Medical: Not on file    Non-medical: Not on file  Tobacco Use  . Smoking status: Former Smoker    Packs/day: 0.50    Years: 30.00    Pack years: 15.00    Types: Cigarettes    Last attempt to quit: 04/15/2018    Years since quitting: 0.0  . Smokeless tobacco: Never Used  Substance and Sexual Activity  . Alcohol use: No    Alcohol/week: 0.0 oz  . Drug use: Yes    Types: Oxycodone, Morphine    Comment: Opana daily for chronic  back pain  . Sexual activity: Yes  Lifestyle  . Physical activity:    Days per week: Not on file    Minutes per session: Not on file  . Stress: Not on file  Relationships  . Social connections:    Talks on phone: Not on file    Gets together: Not on file    Attends religious service: Not on file    Active member of club or organization: Not on file    Attends meetings of clubs or organizations: Not on file    Relationship status: Not on file  Other Topics Concern  . Not on file  Social History Narrative  . Not on file    Vitals:   04/28/18 1427  BP: 126/88  Pulse: 60  Resp: 12  Temp: 98.5 F (36.9 C)  SpO2: 97%   Body mass index is 33.51 kg/m.    Physical Exam  Nursing note and vitals reviewed. Constitutional: He is oriented to person, place, and time. He appears well-developed. No distress.  HENT:  Head: Normocephalic and atraumatic.  Mouth/Throat: Oropharynx is clear and moist and mucous membranes are normal.  Eyes: Pupils are equal, round, and reactive to light. Conjunctivae are normal.  Cardiovascular: Normal rate and regular rhythm.  No murmur heard. Pulses:      Dorsalis pedis pulses are 2+ on the right side, and 2+ on the left side.  Respiratory: Effort normal and breath sounds normal. No respiratory distress.  GI: Soft. He exhibits no mass. There is no hepatomegaly. There is no tenderness.  Musculoskeletal: He exhibits no edema.    Lymphadenopathy:    He has no cervical adenopathy.  Neurological: He is alert and oriented to person, place, and time. He has normal strength. No cranial nerve deficit. Gait abnormal.  Unstable gait assisted with a walker. Strength 4-5/5 4 extremities. Slurred speech.  Skin: Skin is warm. No erythema.  Psychiatric: He has a normal mood and affect. Cognition and memory are normal.  Well groomed, good eye contact.    ASSESSMENT AND PLAN:  Mr. Odysseus was seen today for hospitalization follow-up.  Diagnoses and all orders for this visit:  Nontraumatic subcortical hemorrhage of right cerebral hemisphere [facility])  Appt with neuro already arranged. PT already arranged,helping. Fall precautions. Adequate control of HTN,HLD. Encouraged tobacco cessation. No ASA or NSAID's.   Insomnia, unspecified type  Well controlled with Trazodone 50 mg. Good sleep hygiene. F/U in 6 months.  -     traZODone (DESYREL) 50 MG tablet; Take 1 tablet (50 mg total) by mouth at bedtime.  Hypertriglyceridemia  Tricor 145 mg daily recommended. Low fat diet discussed and recommended. F/U in 3-4 months.  -     fenofibrate (TRICOR) 145 MG tablet; Take 1 tablet (145 mg total) by mouth daily.  TOBACCO ABUSE Educated about adverse effects and benefits of smoking cessation. He would like to try Nicotine patches.  Systolic hypertension with cerebrovascular disease Adequately controlled based on home BP readings. DBP slightly above goal.  No changes in current management. DASH-low salt diet recommended.  F/U in 2 months, before if needed.   Sleep apnea He has appt with Dr Brett Fairy.        Kailash Hinze G. [place], MD  [facility]. [place] office.

## 2018-04-28 NOTE — Patient Instructions (Signed)
   Your voice, I believe, is weak from the stroke. I think it is your vocal cords and not your core muscles because your volume with repeated "Hey!" was normal, but I heard a tremor/weakness in your voice as you held "ahhhhhhh..."  We will give you some exercises for your weaker lips and tongue and may add some for your voice muscles later.

## 2018-04-28 NOTE — Assessment & Plan Note (Signed)
Educated about adverse effects and benefits of smoking cessation. He would like to try Nicotine patches.

## 2018-05-01 ENCOUNTER — Ambulatory Visit: Payer: Federal, State, Local not specified - PPO | Admitting: Occupational Therapy

## 2018-05-01 ENCOUNTER — Encounter: Payer: Self-pay | Admitting: Occupational Therapy

## 2018-05-01 DIAGNOSIS — R208 Other disturbances of skin sensation: Secondary | ICD-10-CM

## 2018-05-01 DIAGNOSIS — R293 Abnormal posture: Secondary | ICD-10-CM | POA: Diagnosis not present

## 2018-05-01 DIAGNOSIS — R2681 Unsteadiness on feet: Secondary | ICD-10-CM | POA: Diagnosis not present

## 2018-05-01 DIAGNOSIS — R471 Dysarthria and anarthria: Secondary | ICD-10-CM | POA: Diagnosis not present

## 2018-05-01 DIAGNOSIS — M6281 Muscle weakness (generalized): Secondary | ICD-10-CM

## 2018-05-01 DIAGNOSIS — R29818 Other symptoms and signs involving the nervous system: Secondary | ICD-10-CM

## 2018-05-01 DIAGNOSIS — R270 Ataxia, unspecified: Secondary | ICD-10-CM

## 2018-05-01 DIAGNOSIS — R2689 Other abnormalities of gait and mobility: Secondary | ICD-10-CM | POA: Diagnosis not present

## 2018-05-01 NOTE — Therapy (Signed)
[facility] [address] [place], [place], [address] Phone: [phone]   Fax:  [phone]  Occupational Therapy Treatment  Patient Details  Name: Richard Vaughn MRN: [id number] Date of Birth: 26-Dec-1950 Referring Provider: Dr. Erlinda Hong   Encounter Date: 05/01/2018  OT End of Session - 05/01/18 1639    Number of Visits  16    Date for OT Re-Evaluation  05/22/18    Authorization Type  [employer] - 75 visits for all disciplines. Counts as 1 visit if occurs on the same day. Pt approved for aquatic therapy    Authorization - Visit Number  3    Authorization - Number of Visits  57    OT Start Time  3419 pt arrived late    OT Stop Time  1401    OT Time Calculation (min)  40 min    Activity Tolerance  Patient tolerated treatment well       Past Medical History:  Diagnosis Date  . Barrett's esophagus   . Chronic back pain   . Gastritis    Mild  . Gastroparesis   . GERD (gastroesophageal reflux disease)   . Headache   . Hyperglycemia   . Nephrolithiasis   . Renal cyst   . Stricture and stenosis of esophagus   . Stroke ([place]) 07-2015  . Vision abnormalities     Past Surgical History:  Procedure Laterality Date  . [place] SURGERY  2012  . COLONOSCOPY    . [place] SURGERY  2005, 2010   replaced L4 and L5  . UPPER GASTROINTESTINAL ENDOSCOPY      There were no vitals filed for this visit.  Subjective Assessment - 05/01/18 1324    Subjective   I like these goals.     Pertinent History  R BG CVA.  Pt has h/o of chronic low back pain prior to stroke    Patient Stated Goals  To walk better and get stronger    Currently in Pain?  Yes    Pain Location  Back    Pain Orientation  Lower    Pain Descriptors / Indicators  Aching    Pain Type  Chronic pain    Pain Onset  More than a month ago    Pain Frequency  Constant    Aggravating Factors   walking, standing too long    Pain Relieving Factors  sitting/resting                    OT Treatments/Exercises (OP) - 05/01/18 0001      ADLs   ADL Comments  Reviewed goals and POC with pt and wife. Both in agreement. Written copy of goals provided to pt.  Reviewed visit limitations and pt verbalized understanding of scheduling of appts.       Neurological Re-education Exercises   Other Exercises 2  Neuro re ed to address development of coordination HEP. Pt able to return demonstrate all activities after instruction and practice.               OT Education - 05/01/18 1637    Education Details  OT eval, POC and goals, HEP for coordination    Person(s) Educated  Patient;Spouse    Methods  Explanation;Demonstration;Handout    Comprehension  Verbalized understanding;Returned demonstration       OT Short Term Goals - 05/01/18 1638      OT SHORT TERM GOAL #1   Title  Pt will  be mod I with HEP for coordination for LUE, balance -goals due  05/23/2018    Status  On-going      OT SHORT TERM GOAL #2   Title  Pt will be mod I with tying shoes    Status  On-going      OT SHORT TERM GOAL #3   Title  Pt will demonstrate improved coordination as evidenced by decreasing time on 9 hole peg with LUE by at least 7 seven seconds to assist with functional fine motor tasks.     Baseline  baseline= 50.30    Status  On-going      OT SHORT TERM GOAL #4   Title  Pt will be mod I with shaving    Status  On-going      OT SHORT TERM GOAL #5   Title  Pt will be supervision with shower transfers    Status  On-going      OT SHORT TERM GOAL #6   Title  Pt will be mod I with simple snack/cold meal prep     Status  On-going      OT SHORT TERM GOAL #7   Title  Pt will be mod I with loading dishwasher/washing dishes    Status  On-going        OT Long Term Goals - 05/01/18 1638      OT LONG TERM GOAL #1   Title  Pt will be mod I with upgraded HEP - goals due 06/20/2018    Status  On-going      OT LONG TERM GOAL #2   Title  Pt will be mod I with  shower transfers    Status  On-going      OT LONG TERM GOAL #3   Title  Pt will be mod I with hot meal prep with at least two items.    Status  On-going      OT LONG TERM GOAL #4   Title  Pt demonstrate improved coordination as evidenced by decreasing time on 9 hole peg by at least 10 seconds with LUE.     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     Baseline  baseline = 29    Status  On-going      OT LONG TERM GOAL #7   Title  Pt will return to water fitness class at [facility]    Status  On-going      OT LONG TERM GOAL #8   Title  Pt will be mod I with familiar home mgmt tasks    Status  On-going            Plan - 05/01/18 1638    Clinical Impression Statement  Pt in agreement with goals and POC. Pt progressing toward goals.     Occupational Profile and client history currently impacting functional performance  PMH:  L BG CVA, chronic low back pain, HLD, Barrents esophagus, dysphagia, HTN, sleep apnea,     Occupational performance  deficits (Please refer to evaluation for details):  ADL's;IADL's;Rest and Sleep;Leisure;Social Participation    Rehab Potential  Good    Current Impairments/barriers affecting progress:  will monitor cognition    OT Frequency  2x / week    OT Duration  8 weeks    OT Treatment/Interventions  Self-care/ADL training;Aquatic Therapy;Moist Heat;Electrical Stimulation;Fluidtherapy;DME and/or AE instruction;Neuromuscular education;Therapeutic exercise;Functional Mobility Training;Manual Therapy;Passive range of motion;Therapeutic activities;Patient/family education;Balance training    Plan  check HEP for  coordination, NMR for balance, motor planning , functional use of LUE, weight bearing for ataxia    Consulted and Agree with Plan of Care  Patient;Family member/caregiver    Family Member Consulted  wife [name]       Patient will benefit from skilled therapeutic intervention in order to improve the following deficits and impairments:  Abnormal gait, Decreased activity tolerance, Decreased balance, Decreased coordination, Decreased mobility, Difficulty walking, Impaired UE functional use, Impaired sensation  Visit Diagnosis: Ataxia  Other symptoms and signs involving the nervous system  Unsteadiness on feet  Abnormal posture  Muscle weakness (generalized)  Other disturbances of skin sensation    Problem List Patient Active Problem List   Diagnosis Date Noted  . Chronic pain syndrome   . Chronic bilateral low back pain   . Benign essential HTN   . Tobacco abuse   . Marijuana abuse   . Hyperlipidemia   . History of CVA with residual deficit   . Dysphagia, post-stroke   . ICH (intracerebral hemorrhage) (HCC) - R thalmic/PLIC d/t HTN 59/74/1638  . Insomnia 07/13/2017  . PAD (peripheral artery disease) ([place]) 07/13/2017  . Hyperlipidemia with target LDL less than 70 07/13/2017  . Stroke-like symptom 09/22/2016  . Paresthesia 09/22/2016  . CVA (cerebral vascular accident) ([place]) 09/22/2016  . Cerebrovascular accident (CVA) due to thrombosis of left carotid artery ([place]) 09/23/2015  . Primary snoring 09/23/2015  . Hypersomnia with sleep apnea 09/23/2015  . Obstructive sleep apnea 09/23/2015  . Lacunar infarct, acute ([place]) 08/25/2015  . Sleep apnea 08/25/2015  . Dysarthria   . CVA (cerebral infarction) 08/02/2015  . Acute hyperglycemia 08/02/2015  . Systolic hypertension with cerebrovascular disease 12/27/2014  . Epistaxis 07/28/2012  . HYPERGLYCEMIA 10/02/2010  . NEPHROLITHIASIS 05/23/2009  . BARRETTS ESOPHAGUS 02/20/2009  . Gastroparesis 02/20/2009  . RADICULOPATHY  10/21/2008  . GERD 10/08/2008  . ESOPHAGITIS 08/15/2008  . ESOPHAGEAL STRICTURE 08/15/2008  . GASTRITIS, ACUTE 08/15/2008  . RENAL CYST 08/14/2008  . TOBACCO ABUSE 08/08/2008  . HEMATURIA, MICROSCOPIC, HX OF 08/08/2008  . PULMONARY NODULE 01/10/2008    Quay Burow, OTR/L 05/01/2018, 4:49 PM  [facility] [address] [place], [place], [address] Phone: [phone]   Fax:  [phone]  Name: BENTLEE BENNINGFIELD MRN: [id number] Date of Birth: 1951/10/18

## 2018-05-01 NOTE — Patient Instructions (Signed)
  Coordination Activities  Perform the following activities for  minutes 1-2 times per day with left hand(s).   Rotate ball in fingertips (clockwise and counter-clockwise).  Toss ball between hands.  Toss ball in air and catch with the same hand.  Flip cards 1 at a time as fast as you can.  Deal cards with your thumb (Hold deck in hand and push card off top with thumb).  Pick up coins and place in container or coin bank.  Pick up coins and stack.  Pick up coins one at a time until you get 5-10 in your hand, then move coins from palm to fingertips to stack one at a time.  Screw together nuts and bolts, then unfasten.

## 2018-05-02 ENCOUNTER — Ambulatory Visit: Payer: Federal, State, Local not specified - PPO | Admitting: Occupational Therapy

## 2018-05-02 ENCOUNTER — Encounter: Payer: Self-pay | Admitting: Occupational Therapy

## 2018-05-02 DIAGNOSIS — R29818 Other symptoms and signs involving the nervous system: Secondary | ICD-10-CM

## 2018-05-02 DIAGNOSIS — R208 Other disturbances of skin sensation: Secondary | ICD-10-CM

## 2018-05-02 DIAGNOSIS — R293 Abnormal posture: Secondary | ICD-10-CM | POA: Diagnosis not present

## 2018-05-02 DIAGNOSIS — R2681 Unsteadiness on feet: Secondary | ICD-10-CM | POA: Diagnosis not present

## 2018-05-02 DIAGNOSIS — R2689 Other abnormalities of gait and mobility: Secondary | ICD-10-CM | POA: Diagnosis not present

## 2018-05-02 DIAGNOSIS — R270 Ataxia, unspecified: Secondary | ICD-10-CM

## 2018-05-02 DIAGNOSIS — M6281 Muscle weakness (generalized): Secondary | ICD-10-CM | POA: Diagnosis not present

## 2018-05-02 DIAGNOSIS — R471 Dysarthria and anarthria: Secondary | ICD-10-CM | POA: Diagnosis not present

## 2018-05-02 NOTE — Therapy (Signed)
[facility] [address] [place], [place], [address] Phone: [phone]   Fax:  [phone]  Occupational Therapy Treatment  Patient Details  Name: Richard Vaughn MRN: [id number] Date of Birth: Mar 28, 1951 Referring Provider: Dr. Erlinda Hong   Encounter Date: 05/02/2018  OT End of Session - 05/02/18 1249    Visit Number  2 OT count    Number of Visits  16    Date for OT Re-Evaluation  05/22/18    Authorization Type  BCBS - 75 visits for all disciplines. Counts as 1 visit if occurs on the same day. Pt approved for aquatic therapy    Authorization - Visit Number  4 overall count    Authorization - Number of Visits  56    OT Start Time  7673    OT Stop Time  1231    OT Time Calculation (min)  43 min    Activity Tolerance  Patient tolerated treatment well       Past Medical History:  Diagnosis Date  . Barrett's esophagus   . Chronic back pain   . Gastritis    Mild  . Gastroparesis   . GERD (gastroesophageal reflux disease)   . Headache   . Hyperglycemia   . Nephrolithiasis   . Renal cyst   . Stricture and stenosis of esophagus   . Stroke ([place]) 07-2015  . Vision abnormalities     Past Surgical History:  Procedure Laterality Date  . [place] SURGERY  2012  . COLONOSCOPY    . [place] SURGERY  2005, 2010   replaced L4 and L5  . UPPER GASTROINTESTINAL ENDOSCOPY      There were no vitals filed for this visit.  Subjective Assessment - 05/02/18 1156    Pertinent History  R BG CVA.  Pt has h/o of chronic low back pain prior to stroke    Patient Stated Goals  To walk better and get stronger    Currently in Pain?  Yes    Pain Score  4     Pain Location  Back    Pain Orientation  Lower    Pain Descriptors / Indicators  Aching    Pain Type  Chronic pain    Pain Onset  More than a month ago    Pain Frequency  Constant    Aggravating Factors   walking, standing too long    Pain Relieving Factors  sitting,  resting                   OT Treatments/Exercises (OP) - 05/02/18 0001      Neurological Re-education Exercises   Other Exercises 2  Neuro re ed with focus on weight bearing in quadruped with forced paradaigm to increase demand on LUE, LLE and core with lifting one UE, then lifting one LE then alternating lifting of LE's.  Pt able to complete with intermittent contact guard. Transitioned into sitting EOM and addressed controlled sit to stand and stand to sit without use of UE's.  Also addressed increased stabilization of LLE/core in standing with stepping of RLE and no UE support. Also focused on functional ambulation with head turns as well as on uneven surfaces.  Pt has some LOB with head turns and some decreased control with uneven surfaces but no true bias present with standing balance. Pt also with L neglect and decreased attention to LLE with functional ambulation.  OT Short Term Goals - 05/02/18 1248      OT SHORT TERM GOAL #1   Title  Pt will be mod I with HEP for coordination for LUE, balance -goals due  05/23/2018    Status  On-going      OT SHORT TERM GOAL #2   Title  Pt will be mod I with tying shoes    Status  On-going      OT SHORT TERM GOAL #3   Title  Pt will demonstrate improved coordination as evidenced by decreasing time on 9 hole peg with LUE by at least 7 seven seconds to assist with functional fine motor tasks.     Baseline  baseline= 50.30    Status  On-going      OT SHORT TERM GOAL #4   Title  Pt will be mod I with shaving    Status  On-going      OT SHORT TERM GOAL #5   Title  Pt will be supervision with shower transfers    Status  On-going      OT SHORT TERM GOAL #6   Title  Pt will be mod I with simple snack/cold meal prep     Status  On-going      OT SHORT TERM GOAL #7   Title  Pt will be mod I with loading dishwasher/washing dishes    Status  On-going        OT Long Term Goals - 05/02/18 1248      OT LONG  TERM GOAL #1   Title  Pt will be mod I with upgraded HEP - goals due 06/20/2018    Status  On-going      OT LONG TERM GOAL #2   Title  Pt will be mod I with shower transfers    Status  On-going      OT LONG TERM GOAL #3   Title  Pt will be mod I with hot meal prep with at least two items.    Status  On-going      OT LONG TERM GOAL #4   Title  Pt demonstrate improved coordination as evidenced by decreasing time on 9 hole peg by at least 10 seconds with LUE.     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     Baseline  baseline = 29    Status  On-going      OT LONG TERM GOAL #7   Title  Pt will return [facility] fitness class [facility]    Status  On-going      OT LONG TERM GOAL #8   Title  Pt will be mod I with familiar home mgmt tasks    Status  On-going            Plan - 05/02/18 1248    Clinical Impression Statement  Pt progressing toward goals. Pt reports he is not sleeping great a night - encouraged pt to monitor and discuss with MD if this  becomes a pattern    Occupational Profile and client history currently impacting functional performance  PMH:  L BG CVA, chronic low back pain, HLD, Barrents esophagus, dysphagia, HTN, sleep apnea,     Occupational performance deficits (Please refer to evaluation for details):  ADL's;IADL's;Rest and Sleep;Leisure;Social Participation    Rehab Potential  Good    Current Impairments/barriers affecting progress:  will monitor cognition    OT Frequency  2x / week    OT Duration  8 weeks    OT Treatment/Interventions  Self-care/ADL training;Aquatic Therapy;Moist Heat;Electrical  Stimulation;Fluidtherapy;DME and/or AE instruction;Neuromuscular education;Therapeutic exercise;Functional Mobility Training;Manual Therapy;Passive range of motion;Therapeutic activities;Patient/family education;Balance training    Plan   NMR for balance, motor planning , functional use of LUE, weight bearing for ataxia    Consulted and Agree with Plan of Care  Patient;Family member/caregiver    Family Member Consulted  wife [name]       Patient will benefit from skilled therapeutic intervention in order to improve the following deficits and impairments:  Abnormal gait, Decreased activity tolerance, Decreased balance, Decreased coordination, Decreased mobility, Difficulty walking, Impaired UE functional use, Impaired sensation  Visit Diagnosis: Other symptoms and signs involving the nervous system  Ataxia  Unsteadiness on feet  Abnormal posture  Muscle weakness (generalized)  Other disturbances of skin sensation    Problem List Patient Active Problem List   Diagnosis Date Noted  . Chronic pain syndrome   . Chronic bilateral low back pain   . Benign essential HTN   . Tobacco abuse   . Marijuana abuse   . Hyperlipidemia   . History of CVA with residual deficit   . Dysphagia, post-stroke   . ICH (intracerebral hemorrhage) (HCC) - R thalmic/PLIC d/t HTN 96/02/5408  . Insomnia 07/13/2017  . PAD (peripheral artery disease) ([place]) 07/13/2017  . Hyperlipidemia with target LDL less than 70 07/13/2017  . Stroke-like symptom 09/22/2016  . Paresthesia 09/22/2016  . CVA (cerebral vascular accident) ([place]) 09/22/2016  . Cerebrovascular accident (CVA) due to thrombosis of left carotid artery ([place]) 09/23/2015  . Primary snoring 09/23/2015  . Hypersomnia with sleep apnea 09/23/2015  . Obstructive sleep apnea 09/23/2015  . Lacunar infarct, acute ([place]) 08/25/2015  . Sleep apnea 08/25/2015  . Dysarthria   . CVA (cerebral infarction) 08/02/2015  . Acute hyperglycemia 08/02/2015  .  Systolic hypertension with cerebrovascular disease 12/27/2014  . Epistaxis 07/28/2012  . HYPERGLYCEMIA 10/02/2010  . NEPHROLITHIASIS 05/23/2009  . BARRETTS ESOPHAGUS 02/20/2009  . Gastroparesis 02/20/2009  . RADICULOPATHY 10/21/2008  . GERD 10/08/2008  . ESOPHAGITIS 08/15/2008  . ESOPHAGEAL STRICTURE 08/15/2008  . GASTRITIS, ACUTE 08/15/2008  . RENAL CYST 08/14/2008  . TOBACCO ABUSE 08/08/2008  . HEMATURIA, MICROSCOPIC, HX OF 08/08/2008  . PULMONARY NODULE 01/10/2008    Quay Burow, OTR/L 05/02/2018, 12:51 PM  [facility] [address] [place], [place], [address] Phone: [phone]   Fax:  [phone]  Name: VERA WISHART MRN: [id number] Date of Birth: 01-05-1951

## 2018-05-10 ENCOUNTER — Encounter: Payer: Self-pay | Admitting: Physical Therapy

## 2018-05-10 ENCOUNTER — Ambulatory Visit: Payer: Federal, State, Local not specified - PPO | Admitting: Speech Pathology

## 2018-05-10 ENCOUNTER — Encounter: Payer: Self-pay | Admitting: Occupational Therapy

## 2018-05-10 ENCOUNTER — Encounter: Payer: Federal, State, Local not specified - PPO | Admitting: Speech Pathology

## 2018-05-10 ENCOUNTER — Encounter: Payer: Self-pay | Admitting: Speech Pathology

## 2018-05-10 ENCOUNTER — Ambulatory Visit: Payer: Federal, State, Local not specified - PPO | Admitting: Occupational Therapy

## 2018-05-10 ENCOUNTER — Ambulatory Visit: Payer: Federal, State, Local not specified - PPO | Admitting: Physical Therapy

## 2018-05-10 VITALS — BP 118/81

## 2018-05-10 DIAGNOSIS — R2681 Unsteadiness on feet: Secondary | ICD-10-CM | POA: Diagnosis not present

## 2018-05-10 DIAGNOSIS — R270 Ataxia, unspecified: Secondary | ICD-10-CM

## 2018-05-10 DIAGNOSIS — R471 Dysarthria and anarthria: Secondary | ICD-10-CM

## 2018-05-10 DIAGNOSIS — R208 Other disturbances of skin sensation: Secondary | ICD-10-CM

## 2018-05-10 DIAGNOSIS — R293 Abnormal posture: Secondary | ICD-10-CM | POA: Diagnosis not present

## 2018-05-10 DIAGNOSIS — M6281 Muscle weakness (generalized): Secondary | ICD-10-CM

## 2018-05-10 DIAGNOSIS — R2689 Other abnormalities of gait and mobility: Secondary | ICD-10-CM | POA: Diagnosis not present

## 2018-05-10 DIAGNOSIS — R29818 Other symptoms and signs involving the nervous system: Secondary | ICD-10-CM

## 2018-05-10 NOTE — Patient Instructions (Addendum)
Access Code: [email or web address]  URL: [email or web address]  Date: 05/10/2018  Prepared by: McKenna - 3 reps - 1 sets - 30 hold - 1x daily - 5x weekly  Supine Hamstring Stretch with Strap - 3 reps - 1 sets - 30 hold - 1x daily - 5x weekly  Narrow Stance with Eyes Closed and Head Rotation on Foam Pad - 10 reps - 3 sets - 1x daily - 5x weekly  Narrow Stance with Eyes Closed and Head Nods on Foam Pad - 10 reps - 3 sets - 1x daily - 5x weekly

## 2018-05-10 NOTE — Therapy (Signed)
[facility] [address] [place], [place], [address] Phone: [phone]   Fax:  [phone]  Physical Therapy Treatment  Patient Details  Name: Richard Vaughn MRN: [id number] Date of Birth: 03/01/1951 Referring Provider: Dr. Erlinda Hong   Encounter Date: 05/10/2018  PT End of Session - 05/10/18 1303    Visit Number  2    Number of Visits  17    Date for PT Re-Evaluation  06/24/18    Authorization Type  Federal BCBS: 75 visits combines-awaiting to see if aquatic therapy is covered    PT Start Time  1145    PT Stop Time  1230    PT Time Calculation (min)  45 min    Equipment Utilized During Treatment  Gait belt    Activity Tolerance  Patient tolerated treatment well    Behavior During Therapy  WFL for tasks assessed/performed       Past Medical History:  Diagnosis Date  . Barrett's esophagus   . Chronic back pain   . Gastritis    Mild  . Gastroparesis   . GERD (gastroesophageal reflux disease)   . Headache   . Hyperglycemia   . Nephrolithiasis   . Renal cyst   . Stricture and stenosis of esophagus   . Stroke ([place]) 07-2015  . Vision abnormalities     Past Surgical History:  Procedure Laterality Date  . [place] SURGERY  2012  . COLONOSCOPY    . [place] SURGERY  2005, 2010   replaced L4 and L5  . UPPER GASTROINTESTINAL ENDOSCOPY      There were no vitals filed for this visit.   Subjective Assessment - 05/10/18 1151    Subjective  No new falls. Pt's spouse does report he tends to walk without RW with her outside. Reports he did walk to mailbox once without  her as he got up before her.     Patient is accompained by:  Family member    Pertinent History  HTN, chronic back pain, HLD, visual abnormalities, sleep apnea, PAD, hx of L BG CVA    Patient Stated Goals  Walk without RW and get back to water aerobics at the [facility]    Currently in Pain?  Yes    Pain Score  4     Pain Location  Back    Pain  Orientation  Lower    Pain Descriptors / Indicators  Aching    Pain Type  Chronic pain    Pain Onset  More than a month ago    Pain Frequency  Constant    Aggravating Factors   General activity    Pain Relieving Factors  Sitting, resting    Effect of Pain on Daily Activities  --    Multiple Pain Sites  No       [facility] Adult PT Treatment/Exercise - 05/10/18 1259      Self-Care   Other Self-Care Comments   Discussed safety with mobility and fall risk with not using RW at this time when walking alone. Okay with pt to walk without AD when spouse is right next to him. Also reinforced use of RW if he is going "out and about" as his leg does fatigue quicker now since the CVA ,which is when his foot drop becomes more aparent. Pt verbalized understanding and reluctancly agreed.       Neuro Re-ed    Neuro Re-ed Details   issued HEP for stretching and corner  balance exercise, see pt. instructions for full details.           PT Short Term Goals - 04/25/18 1353      PT SHORT TERM GOAL #1   Title  Pt will be IND in HEP to improve deficits listed above. TARGET DATE FOR ALL STGS: 05/23/18    Status  New      PT SHORT TERM GOAL #2   Title  Pt will improve gait speed to >/=1.8 ft/sec with LRAD to reduce falls risk.     Status  New      PT SHORT TERM GOAL #3   Title  Pt will amb. 300' with LRAD at MOD I level to improve functional mobility.     Status  New      PT SHORT TERM GOAL #4   Title  Pt will improve TUG time to </=20 sec. with LRAD to reduce falls risk.     Status  New      PT SHORT TERM GOAL #5   Title  Pt will improve BERG score to >/=40/56 to decr. falls risk.     Status  New      Additional Short Term Goals   Additional Short Term Goals  Yes      PT SHORT TERM GOAL #6   Title  Pt will begin aquatic therapy to improve endurance, strength, and balance.     Status  New        PT Long Term Goals - 04/25/18 1355      PT LONG TERM GOAL #1   Title  Pt will verbalize  understanding of CVA risk factors and s/s of CVA to reduce risk of another stroke. TARGET DATE FOR ALL LTGS: 06/20/18    Status  New      PT LONG TERM GOAL #2   Title  Pt will improve TUG time to </=13.5 sec. with LRAD to reduce falls risk.     Status  New      PT LONG TERM GOAL #3   Title  Pt will improve gait speed to >/=2.89ft/sec. with LRAD to safely amb. in the community.     Status  New      PT LONG TERM GOAL #4   Title  Pt will improve BERG score to >/=45/56 to decr. falls risk.     Status  New      PT LONG TERM GOAL #5   Title  Pt will amb. 600' over even/uneven terrain with LRAD at MOD I level to improve functional mobility.     Status  New        Plan - 05/10/18 1304    Clinical Impression Statement  Today's skilled session focused on HEP education for LE flexibility and for balance. Pt. did well with floor balance, so was progressed to compliant surface and issued an HEP. Pt. would benefit from further PT to continue to progress toward his goals and make gains toward regaining function.    Rehab Potential  Good    Clinical Impairments Affecting Rehab Potential  see above    PT Frequency  2x / week    PT Duration  8 weeks    PT Treatment/Interventions  ADLs/Self Care Home Management;Biofeedback;Canalith Repostioning;Electrical Stimulation;Therapeutic activities;Functional mobility training;Stair training;Gait training;Orthotic Fit/Training;Therapeutic exercise;Manual techniques;Vestibular;Patient/family education;DME Instruction;Neuromuscular re-education;Balance training aquatic therapy    PT Next Visit Plan  add dynamic balance components to HEP; work on gait without AD as pt does not want to  use RW, ? cane if needed    Consulted and Agree with Plan of Care  Patient;Family member/caregiver    Family Member Consulted  wife        Patient will benefit from skilled therapeutic intervention in order to improve the following deficits and impairments:  Abnormal gait, Decreased  endurance, Impaired sensation, Decreased strength, Decreased knowledge of use of DME, Impaired UE functional use, Decreased balance, Decreased mobility, Impaired flexibility, Postural dysfunction(PT will not directly treat back pain but will monitor closely)  Visit Diagnosis: Unsteadiness on feet  Muscle weakness (generalized)  Other disturbances of skin sensation     Problem List Patient Active Problem List   Diagnosis Date Noted  . Chronic pain syndrome   . Chronic bilateral low back pain   . Benign essential HTN   . Tobacco abuse   . Marijuana abuse   . Hyperlipidemia   . History of CVA with residual deficit   . Dysphagia, post-stroke   . ICH (intracerebral hemorrhage) ([facility]CC) - R thalmic/PLIC d/t HTN 92/42/6834  . Insomnia 07/13/2017  . PAD (peripheral artery disease) ([place]) 07/13/2017  . Hyperlipidemia with target LDL less than 70 07/13/2017  . Stroke-like symptom 09/22/2016  . Paresthesia 09/22/2016  . CVA (cerebral vascular accident) ([place]) 09/22/2016  . Cerebrovascular accident (CVA) due to thrombosis of left carotid artery ([place]) 09/23/2015  . Primary snoring 09/23/2015  . Hypersomnia with sleep apnea 09/23/2015  . Obstructive sleep apnea 09/23/2015  . Lacunar infarct, acute ([place]) 08/25/2015  . Sleep apnea 08/25/2015  . Dysarthria   . CVA (cerebral infarction) 08/02/2015  . Acute hyperglycemia 08/02/2015  . Systolic hypertension with cerebrovascular disease 12/27/2014  . Epistaxis 07/28/2012  . HYPERGLYCEMIA 10/02/2010  . NEPHROLITHIASIS 05/23/2009  . BARRETTS ESOPHAGUS 02/20/2009  . Gastroparesis 02/20/2009  . RADICULOPATHY 10/21/2008  . GERD 10/08/2008  . ESOPHAGITIS 08/15/2008  . ESOPHAGEAL STRICTURE 08/15/2008  . GASTRITIS, ACUTE 08/15/2008  . RENAL CYST 08/14/2008  . TOBACCO ABUSE 08/08/2008  . HEMATURIA, MICROSCOPIC, HX OF 08/08/2008  . PULMONARY NODULE 01/10/2008    Arthor Captain, SPTA 05/10/2018, 1:19 PM  [facility] [address] [place], [place], [address] Phone: [phone]   Fax:  [phone]  Name: Richard Vaughn MRN: [id number] Date of Birth: [birth year]  This note has been reviewed and edited by supervising CI.  Willow Ora, PTA, [facility] [address], [place], [place] [address] [phone] 05/11/18, 10:08 AM

## 2018-05-10 NOTE — Patient Instructions (Signed)
   Do exercises 20x each, 3x a day - in the mirror, slow and big  1. Alternate pucker and smile - OOO-EEE  2. Open mouth big Ahh-OOO with mouth open big  3. Pucker and move your lips side to side  4. Puff up your cheeks with air BIG - swish air from side to side  5. Press lips together flat and pop them open  6. Pucker and kiss big  7. Hold tongue depressor in your lips (no teeth) out straight - hold for    ----- minutes     SLOW LOUD OVER-ENNUNCIATE PAUSE   Say 5x each 3x a day  [place]  SLOW AND BIG - EXAGGERATE YOUR MOUTH, MAKE EACH CONSONANT  Speech exercises - do 5x each, x2-3/day SLOW BIG  SAY THE FOLLOWING- make every sound! Red leather, yellow leather  Big grocery buggy    Purple baby carriage    Proper copper coffee pot Ripe purple cabbage Three free throws [place] [employer], Blue Bulb Flash Message  An Chief Financial Officer for [employer] Six Thick Thistles Stick Double Bubble Gum Freshly Fried Fat Fish Cinnamon aluminum linoleum Black bugs blood Lovely lemon linament Tying Tape Takes Time A Shifty Salt Shaker   Shirts shrink, shells shouldn't Call the cat "[place]" Yellow oil ointment Unique [place] A Three Toed Tree Toad Knapsack Strap Snap Rubber Baby Buggy Bumpers   Use a big breath to power your voice  Practice abdominal breathing before your exercises - hand on belly with thumb on belly button, watch hand/belly move out when you breathe in. Your belly should move in when you breath out.  Breathe before each sentence - give your voice power - lower authoritative voice - 10 sentences 2-3 x a day

## 2018-05-10 NOTE — Therapy (Signed)
[facility] [address] [place], [place], [address] Phone: [phone]   Fax:  [phone]  Speech Language Pathology Treatment  Patient Details  Name: Richard Vaughn MRN: [id number] Date of Birth: 1951-08-01 Referring Provider: Rosalin Hawking, MD   Encounter Date: 05/10/2018  End of Session - 05/10/18 1313    Visit Number  2    Number of Visits  17    Date for SLP Re-Evaluation  08/04/18 90 days    SLP Start Time  1231    SLP Stop Time   1310    SLP Time Calculation (min)  39 min    Activity Tolerance  Patient tolerated treatment well       Past Medical History:  Diagnosis Date  . Barrett's esophagus   . Chronic back pain   . Gastritis    Mild  . Gastroparesis   . GERD (gastroesophageal reflux disease)   . Headache   . Hyperglycemia   . Nephrolithiasis   . Renal cyst   . Stricture and stenosis of esophagus   . Stroke ([place]) 07-2015  . Vision abnormalities     Past Surgical History:  Procedure Laterality Date  . [place] SURGERY  2012  . COLONOSCOPY    . [place] SURGERY  2005, 2010   replaced L4 and L5  . UPPER GASTROINTESTINAL ENDOSCOPY      There were no vitals filed for this visit.  Subjective Assessment - 05/10/18 1233    Subjective  "My voice is strained"    Currently in Pain?  Yes    Pain Score  4     Pain Location  Back    Pain Orientation  Lower    Pain Descriptors / Indicators  Aching    Pain Type  Chronic pain    Pain Onset  More than a month ago            ADULT SLP TREATMENT - 05/10/18 1234      General Information   Behavior/Cognition  Alert;Cooperative      Treatment Provided   Treatment provided  Cognitive-Linquistic      Cognitive-Linquistic Treatment   Treatment focused on  Dysarthria    Skilled Treatment  Initiated training in oral motor exercises for labial weakness with occasional min A. Trained pt in belly breathing and using power voice for short  common phrases reduced or elimiated strained hoarse phonation. Trained pt in pitch glides. Also trained pt in HEP for dysarthria, with min to mod A, however hoarse phonation returns when pt focuses on over-articulation.       Assessment / Recommendations / Plan   Plan  Continue with current plan of care      Progression Toward Goals   Progression toward goals  Progressing toward goals       SLP Education - 05/10/18 1309    Education Details  Abdominal breathing, power voice, HEP for dysarthira and labial weakness    Person(s) Educated  Patient;Spouse    Methods  Explanation;Demonstration;Verbal cues;Handout    Comprehension  Verbal cues required;Returned demonstration;Verbalized understanding;Need further instruction       SLP Short Term Goals - 05/10/18 1313      SLP SHORT TERM GOAL #1   Title  pt will complete HEP with occasional min A     Time  4    Period  Weeks    Status  On-going      SLP SHORT TERM GOAL #2  Title  pt will demo compensations for dysarthria in mod complex sentence responses 90%     Time  4    Period  Weeks    Status  On-going      SLP SHORT TERM GOAL #3   Title  pt will produce 100% intelligible speech in 8 minutes simple conversation with modified independence (compensations)    Time  4    Period  Weeks    Status  On-going       SLP Long Term Goals - 05/10/18 1313      SLP LONG TERM GOAL #1   Title  pt will complete HEP for dysarthria with rare min A over three consecutive sessions    Time  8    Period  Weeks    Status  On-going      SLP LONG TERM GOAL #2   Title  pt will produce 10 minutes intelligible mod complex conversation using compensations, with rare min A over 3 consecutive sessions    Time  8    Period  Weeks    Status  On-going      SLP LONG TERM GOAL #3   Title  pt will demo speech volume in 10 minutes simple-mod complex conversation, in low 70s dB average over three consecutive sessions    Time  8    Period  Weeks     Status  On-going       Plan - 05/10/18 1312    Clinical Impression Statement  Initiated HEP for labial weakness and dysarthria. Initiated training in abdominal breathing and use of power voice in short common phrases to improve voice quality. Voice quality returned to hoarse, strained when pt focused on HEP with over articulation. Pt instructed to reduce throat clearing and use hard swallow instead. Conversational speech remains strained and hoarse. Continue skilled ST to maximize intelligiblity across settings.        Patient will benefit from skilled therapeutic intervention in order to improve the following deficits and impairments:   Dysarthria and anarthria    Problem List Patient Active Problem List   Diagnosis Date Noted  . Chronic pain syndrome   . Chronic bilateral low back pain   . Benign essential HTN   . Tobacco abuse   . Marijuana abuse   . Hyperlipidemia   . History of CVA with residual deficit   . Dysphagia, post-stroke   . ICH (intracerebral hemorrhage) (HCC) - R thalmic/PLIC d/t HTN 95/18/8416  . Insomnia 07/13/2017  . PAD (peripheral artery disease) ([place]) 07/13/2017  . Hyperlipidemia with target LDL less than 70 07/13/2017  . Stroke-like symptom 09/22/2016  . Paresthesia 09/22/2016  . CVA (cerebral vascular accident) ([place]) 09/22/2016  . Cerebrovascular accident (CVA) due to thrombosis of left carotid artery ([place]) 09/23/2015  . Primary snoring 09/23/2015  . Hypersomnia with sleep apnea 09/23/2015  . Obstructive sleep apnea 09/23/2015  . Lacunar infarct, acute ([facility]) 08/25/2015  . Sleep apnea 08/25/2015  . Dysarthria   . CVA (cerebral infarction) 08/02/2015  . Acute hyperglycemia 08/02/2015  . Systolic hypertension with cerebrovascular disease 12/27/2014  . Epistaxis 07/28/2012  . HYPERGLYCEMIA 10/02/2010  . NEPHROLITHIASIS 05/23/2009  . BARRETTS ESOPHAGUS 02/20/2009  . Gastroparesis 02/20/2009  . RADICULOPATHY 10/21/2008  . GERD 10/08/2008  .  ESOPHAGITIS 08/15/2008  . ESOPHAGEAL STRICTURE 08/15/2008  . GASTRITIS, ACUTE 08/15/2008  . RENAL CYST 08/14/2008  . TOBACCO ABUSE 08/08/2008  . HEMATURIA, MICROSCOPIC, HX OF 08/08/2008  . PULMONARY NODULE 01/10/2008  Maricela Schreur, Annye Rusk MS, CCC-SLP 05/10/2018, 1:14 PM  [facility] [address] [place], [place], [address] Phone: [phone]   Fax:  [phone]   Name: JAYCUB NOORANI MRN: [id number] Date of Birth: [birth year]

## 2018-05-10 NOTE — Therapy (Signed)
[facility] [address] [place], [place], [address] Phone: [phone]   Fax:  [phone]  Occupational Therapy Treatment  Patient Details  Name: Richard Vaughn MRN: [id number] Date of Birth: 1951/09/18 Referring Provider: Dr. Erlinda Hong   Encounter Date: 05/10/2018  OT End of Session - 05/10/18 1741    Visit Number  3    Number of Visits  16    Date for OT Re-Evaluation  05/22/18    Authorization Type  [employer] - 75 visits for all disciplines. Counts as 1 visit if occurs on the same day. Pt approved for aquatic therapy    Authorization - Visit Number  5    Authorization - Number of Visits  51    OT Start Time  1350 pt in restroom    OT Stop Time  1436    OT Time Calculation (min)  46 min    Activity Tolerance  Patient tolerated treatment well       Past Medical History:  Diagnosis Date  . Barrett's esophagus   . Chronic back pain   . Gastritis    Mild  . Gastroparesis   . GERD (gastroesophageal reflux disease)   . Headache   . Hyperglycemia   . Nephrolithiasis   . Renal cyst   . Stricture and stenosis of esophagus   . Stroke ([place]) 07-2015  . Vision abnormalities     Past Surgical History:  Procedure Laterality Date  . [place] SURGERY  2012  . COLONOSCOPY    . [place] SURGERY  2005, 2010   replaced L4 and L5  . UPPER GASTROINTESTINAL ENDOSCOPY      Vitals:   05/10/18 1737  BP: 118/81    Subjective Assessment - 05/10/18 1738    Subjective   This is the first time I have been in the water since my stroke (pt did water fitness class before stroke)    Patient is accompained by:  Family member wife    Pertinent History  R BG CVA.  Pt has h/o of chronic low back pain prior to stroke    Patient Stated Goals  To walk better and get stronger    Currently in Pain?  Yes    Pain Score  4     Pain Location  Back    Pain Orientation  Lower    Pain Descriptors / Indicators  Aching    Pain Type  Chronic  pain    Pain Onset  More than a month ago    Pain Frequency  Constant    Aggravating Factors   walkin, standing too long    Pain Relieving Factors  sitting, resting       Treatment:  Pt arrived today for aquatic therapy - insurance coverage has been verified.  BP 118/81 and wife reports BP has been stable since d/c from hospital. Pt entered pool with RW  Pt entered the pool via steps using bilateral hand rails and close supervision.  Treatment took place in water between 3 feet to 5 feet, varying based on activity and demands desired. Treatment focused initially on functional ambulation at patient's comfortable rate (gait speed very slow as it is on land).  Progressed to increasing patient's gait speed to more normalized speed and to increase resistance due to viscosity principle of water.  Pt initially ambulating with in deeper water and then progressing to more shallow water to increase demand on postural control and balance as well as  LLE control.  Also addressed LE and trunk strengthening as well as postural alignment and control working with pt at edge of pool and UE's on pool edge for stability, focusing on side leg raises, back leg raises using water and speed of movement for resistance.  Pt does have history of SOB while in hospital as well as sleep apnea - initial check of BP mid point showed O2 sats at 88% however with cues for pursed lip breathing pt able to quickly bring sats up to 96%.  Intermittent checks through session showed pt averaging 96-97% sats with occasional cues for pursed lip breathing.  Also addressed UE strengthening and gross coordination using foam dumb bells (white) with pt in semi squat position with back supported by pool side - addressed chest press, horizontal ab/adduction, ER, 10 reps x 2 with brief rest breaks to maintain O2 sats.  Also addressed shoulder hyperextension with dumb bell (white) with pt standing sideways in pool with 1 UE to increase scapular stability and  strength as well as improve gross coordination.  Transitioned to focus on balance with pt standing in modified tandem stance using noodle for UE support.  With LLE behind pt needed min - mod assist  to maintain balance and mod facilitation for hip strategy that was significantly delayed (performed in deeper water to provide more stabilization).  At end of session brief cool down period with pt supine with noodle support under arms and therapist supporting LE's to allow for muscle relaxation prior to exiting the pool.  Pt able to exit the pool at end of session with no UE support and close supervision for first two steps. As pt progressed to very shallow water on steps pt required min a due to incoordination of LE's on steps.  Pt reported that he experienced no back pain during the session and rated pain at end of session as 0/10.  Pt reported that usually when he is seen on land his back pain will increase from 4/10 to 6/10 that evening. Pt asked to monitor back pain this evening to determine longevity of effects water for off setting weight on joints and back for ease of more pain free movement.          OT Short Term Goals - 05/10/18 1739      OT SHORT TERM GOAL #1   Title  Pt will be mod I with HEP for coordination for LUE, balance -goals due  05/23/2018    Status  On-going      OT SHORT TERM GOAL #2   Title  Pt will be mod I with tying shoes    Status  On-going      OT SHORT TERM GOAL #3   Title  Pt will demonstrate improved coordination as evidenced by decreasing time on 9 hole peg with LUE by at least 7 seven seconds to assist with functional fine motor tasks.     Baseline  baseline= 50.30    Status  On-going      OT SHORT TERM GOAL #4   Title  Pt will be mod I with shaving    Status  On-going      OT SHORT TERM GOAL #5   Title  Pt will be supervision with shower transfers    Status  On-going      OT SHORT TERM GOAL #6   Title  Pt will be mod I with simple snack/cold meal prep      Status  On-going  OT SHORT TERM GOAL #7   Title  Pt will be mod I with loading dishwasher/washing dishes    Status  On-going        OT Long Term Goals - 05/10/18 1739      OT LONG TERM GOAL #1   Title  Pt will be mod I with upgraded HEP - goals due 06/20/2018    Status  On-going      OT LONG TERM GOAL #2   Title  Pt will be mod I with shower transfers    Status  On-going      OT LONG TERM GOAL #3   Title  Pt will be mod I with hot meal prep with at least two items.    Status  On-going      OT LONG TERM GOAL #4   Title  Pt demonstrate improved coordination as evidenced by decreasing time on 9 hole peg by at least 10 seconds with LUE.     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     Baseline  baseline = 29    Status  On-going      OT LONG TERM GOAL #7   Title  Pt will return to water fitness class at [facility]    Status  On-going      OT LONG TERM GOAL #8   Title  Pt will be mod I with familiar home mgmt tasks    Status  On-going            Plan - 05/10/18 1740    Clinical Impression Statement  Pt continues to progress toward goals.  Pt stated that after he goes to therapy on land his back usually progresses from a 4/10 to a 6/10.  Pt had 0/10 pain today at end of session and reported no pain during session with various activities.     Occupational Profile and client history currently impacting functional performance  PMH:  L BG CVA, chronic low back pain, HLD, Barrents esophagus, dysphagia, HTN, sleep apnea,     Occupational performance deficits (Please refer to evaluation for details):  ADL's;IADL's;Rest and  Sleep;Leisure;Social Participation    Rehab Potential  Good    Current Impairments/barriers affecting progress:  will monitor cognition    OT Frequency  2x / week    OT Duration  8 weeks    OT Treatment/Interventions  Self-care/ADL training;Aquatic Therapy;Moist Heat;Electrical Stimulation;Fluidtherapy;DME and/or AE instruction;Neuromuscular education;Therapeutic exercise;Functional Mobility Training;Manual Therapy;Passive range of motion;Therapeutic activities;Patient/family education;Balance training    Plan   NMR for balance, motor planning , functional use of LUE, weight bearing for ataxia    Consulted and Agree with Plan of Care  Patient;Family member/caregiver    Family Member Consulted  wife [name]       Patient will benefit from skilled therapeutic intervention in order to improve the following deficits and impairments:  Abnormal gait, Decreased activity tolerance, Decreased balance, Decreased coordination, Decreased mobility, Difficulty walking, Impaired UE functional use, Impaired sensation  Visit Diagnosis: Unsteadiness  on feet  Muscle weakness (generalized)  Other disturbances of skin sensation  Other symptoms and signs involving the nervous system  Ataxia  Abnormal posture    Problem List Patient Active Problem List   Diagnosis Date Noted  . Chronic pain syndrome   . Chronic bilateral low back pain   . Benign essential HTN   . Tobacco abuse   . Marijuana abuse   . Hyperlipidemia   . History of CVA with residual deficit   . Dysphagia, post-stroke   . ICH (intracerebral hemorrhage) (HCC) - R thalmic/PLIC d/t HTN 09/62/8366  . Insomnia 07/13/2017  . PAD (peripheral artery disease) ([place]) 07/13/2017  . Hyperlipidemia with target LDL less than 70 07/13/2017  . Stroke-like symptom 09/22/2016  . Paresthesia 09/22/2016  . CVA (cerebral vascular accident) ([place]) 09/22/2016  . Cerebrovascular accident (CVA) due to thrombosis of left carotid artery ([place]) 09/23/2015   . Primary snoring 09/23/2015  . Hypersomnia with sleep apnea 09/23/2015  . Obstructive sleep apnea 09/23/2015  . Lacunar infarct, acute ([place]) 08/25/2015  . Sleep apnea 08/25/2015  . Dysarthria   . CVA (cerebral infarction) 08/02/2015  . Acute hyperglycemia 08/02/2015  . Systolic hypertension with cerebrovascular disease 12/27/2014  . Epistaxis 07/28/2012  . HYPERGLYCEMIA 10/02/2010  . NEPHROLITHIASIS 05/23/2009  . BARRETTS ESOPHAGUS 02/20/2009  . Gastroparesis 02/20/2009  . RADICULOPATHY 10/21/2008  . GERD 10/08/2008  . ESOPHAGITIS 08/15/2008  . ESOPHAGEAL STRICTURE 08/15/2008  . GASTRITIS, ACUTE 08/15/2008  . RENAL CYST 08/14/2008  . TOBACCO ABUSE 08/08/2008  . HEMATURIA, MICROSCOPIC, HX OF 08/08/2008  . PULMONARY NODULE 01/10/2008    Quay Burow , OTR/L 05/10/2018, 5:44 PM  [facility] [address] [place], [place], [address] Phone: [phone]   Fax:  [phone]  Name: Richard Vaughn MRN: [id number] Date of Birth: 1951/08/04

## 2018-05-11 ENCOUNTER — Encounter: Payer: Federal, State, Local not specified - PPO | Admitting: Occupational Therapy

## 2018-05-11 ENCOUNTER — Ambulatory Visit: Payer: Federal, State, Local not specified - PPO | Admitting: Rehabilitation

## 2018-05-11 NOTE — Progress Notes (Signed)
[facility] [address] [place]. [place] [address] [phone]       OFFICE FOLLOW UP NOTE  [name] Date of Birth:  16-Jan-1951 Medical Record Number:  [id number]   Reason for Referral:  hospital ICH follow up  CHIEF COMPLAINT:  Chief Complaint  Patient presents with  . Follow-up    Hospital Stroke follow up from June 2019 pt saw Dr .Erlinda Hong in hospital room 9 with Richard Vaughn the wife    HPI: Richard Vaughn is being seen today for initial visit in the office for [place] due to hypertensive emergency on 04/15/18. History obtained from patient and chart review. Reviewed all radiology images and labs personally.  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  CT head repeat reviewed showed stable hematoma.  Carotid Doppler showed bilateral ICA stenosis of 1 to 39% with VAs antegrade.  2D echo showed an EF of 65 to 70% without cardiac source of embolus.  LDL satisfactory at 27.  A1c satisfactory at 5.3.  UDS was positive for THC.  Patient was on aspirin 325 mg prior to admission and this was stopped due to [place].  PT OT recommended outpatient PT/OT.  Patient was educated on risk factor modification such as controlling her blood pressure and smoking cessation.  Recommended repeat CT scan in 3 to 4 weeks and to follow blood sort recommended to initiate aspirin 81 mg for secondary stroke prevention.  Patient was discharged in stable condition.  Patient is being seen today for hospital follow-up and is accompanied by his wife.  Continues to have numbness on left side of his face, dysarthria  and left-sided weakness but this has been improving.  Continues to participate in PT/OT/ST at our neuro rehab center [place].  Blood pressure today satisfactory 122/82 and per patient, this is typically lower.  PCP recently prescribed fenofibrate for triglycerides at 327 (previous 08/15/17  90).  Patient is concerned in regards to this elevation as he was staying active by working out and eating healthier.  Patient requesting if there is a chance of this not being accurate.  Recommended rechecking this after today's visit.  Patient has not started fenofibrate at this time and would like to see results of lipid panel prior to starting.  Unable to take Lipitor as this is caused nausea in the past.  Patient recently had sleep apnea testing with Dr. Brett Fairy which was inconclusive and recommended for patient to repeat this study.  Patient has not scheduled follow-up appointment and stated it was due to financial reasons as he had to pay $4000 for a prior study and this is difficult as he has previous hospital bills to pay.  Patient did return to doing all previous activities except for driving.  Denies new or worsening stroke/TIA symptoms at this time.   ROS:   14 system review of systems performed and negative with exception of insomnia, back pain, headache, speech difficulty and weakness  PMH:  Past Medical History:  Diagnosis Date  . Barrett's esophagus   . Chronic back pain   . Gastritis    Mild  . Gastroparesis   .  GERD (gastroesophageal reflux disease)   . Headache   . Hyperglycemia   . Nephrolithiasis   . Renal cyst   . Stricture and stenosis of esophagus   . Stroke ([place]) 07-2015  . Vision abnormalities     PSH:  Past Surgical History:  Procedure Laterality Date  . [place] SURGERY  2012  . COLONOSCOPY    . [place] SURGERY  2005, 2010   replaced L4 and L5  . UPPER GASTROINTESTINAL ENDOSCOPY      Social History:  Social History   Socioeconomic History  . Marital  status: Married    Spouse name: Not on file  . Number of children: 3  . Years of education: Not on file  . Highest education level: Not on file  Occupational History  . Occupation: retired    Fish farm manager: [employer]  Social Needs  . Financial resource strain: Not on file  . Food insecurity:    Worry: Not on file    Inability: Not on file  . Transportation needs:    Medical: Not on file    Non-medical: Not on file  Tobacco Use  . Smoking status: Former Smoker    Packs/day: 0.50    Years: 30.00    Pack years: 15.00    Types: Cigarettes    Last attempt to quit: 04/15/2018    Years since quitting: 0.0  . Smokeless tobacco: Never Used  Substance and Sexual Activity  . Alcohol use: No    Alcohol/week: 0.0 oz  . Drug use: Yes    Types: Oxycodone, Morphine    Comment: mairjuana last use 6 mts ago  . Sexual activity: Yes  Lifestyle  . Physical activity:    Days per week: Not on file    Minutes per session: Not on file  . Stress: Not on file  Relationships  . Social connections:    Talks on phone: Not on file    Gets together: Not on file    Attends religious service: Not on file    Active member of club or organization: Not on file    Attends meetings of clubs or organizations: Not on file    Relationship status: Not on file  . Intimate partner violence:    Fear of current or ex partner: Not on file    Emotionally abused: Not on file    Physically abused: Not on file    Forced sexual activity: Not on file  Other Topics Concern  . Not on file  Social History Narrative  . Not on file    Family History:  Family History  Problem Relation Age of Onset  . Hypertension Father   . Stroke Father   . Hypertension Mother   . Liver cancer Mother   . Hypertension Sister        2 other sisters has also  . Stroke Sister   . Stroke Sister   . Hypertension Brother        2nd brother has also  . Colon cancer Neg Hx   . Esophageal cancer Neg Hx   . Rectal cancer Neg Hx   .  Stomach cancer Neg Hx   . Colon polyps Neg Hx     Medications:   Current Outpatient Medications on File Prior to Visit  Medication Sig Dispense Refill  . fenofibrate (TRICOR) 145 MG tablet Take 1 tablet (145 mg total) by mouth daily. 30 tablet 3  . NEXIUM 40 MG capsule TAKE 1 CAPSULE  DAILY AT 12 NOON. 90 capsule 3  . nicotine (NICODERM CQ - DOSED IN MG/24 HOURS) 21 mg/24hr patch Place 1 patch (21 mg total) onto the skin daily. 28 patch 1  . olmesartan (BENICAR) 20 MG tablet Take 1 tablet (20 mg total) by mouth daily. 90 tablet 1  . oxymorphone (OPANA) 10 MG tablet Take 10 mg by mouth every 6 (six) hours as needed for pain.     . traZODone (DESYREL) 50 MG tablet Take 1 tablet (50 mg total) by mouth at bedtime. 90 tablet 1   No current facility-administered medications on file prior to visit.     Allergies:  No Known Allergies   Physical Exam  Vitals:   05/15/18 1252  BP: 122/82  Pulse: 79  Weight: 262 lb (118.8 kg)  Height: 6\' 2"  (1.88 m)   Body mass index is 33.64 kg/m. No exam data present  General: well developed, well nourished, pleasant elderly African-American male, seated, in no evident distress Head: head normocephalic and atraumatic.   Neck: supple with no carotid or supraclavicular bruits Cardiovascular: regular rate and rhythm, no murmurs Musculoskeletal: no deformity Skin:  no rash/petichiae Vascular:  Normal pulses all extremities  Neurologic Exam Mental Status: Awake and fully alert.  Mild dysarthria.  Oriented to place and time. Recent and remote memory intact. Attention span, concentration and fund of knowledge appropriate. Mood and affect appropriate.  Cranial Nerves: Fundoscopic exam reveals sharp disc margins. Pupils equal, briskly reactive to light. Extraocular movements full without nystagmus. Visual fields full to confrontation. Hearing intact. Facial sensation intact. Face, tongue, palate moves normally and symmetrically.  Motor: Normal bulk and tone.  Normal strength in all tested extremity muscles. Sensory.: intact to touch , pinprick , position and vibratory sensation.  Decreased sensation on left side of face, left arm and left leg. Coordination: Rapid alternating movements normal in all extremities. Finger-to-nose and heel-to-shin performed accurately bilaterally. Gait and Station: Arises from chair without difficulty. Stance is normal. Gait demonstrates normal stride length and balance . Able to heel, toe and tandem walk without difficulty.  Reflexes: 1+ and symmetric. Toes downgoing.    NIHSS  2 Modified Rankin  2    Diagnostic Data (Labs, Imaging, Testing)  CT head wo contrast 04/15/2018 IMPRESSION: 1. Acute hemorrhage within the right thalamus measuring up to 1.5 cm, 1.3 cc. Mild associated edema and mass effect. 2. Stable advanced chronic microvascular ischemic changes of the brain and mild brain parenchymal volume loss. 3. ASPECTS is not applicable.  CT head wo contrast (repeat) 04/15/2018 IMPRESSION: No change in a right thalamic hemorrhage.  No new abnormality.  Echocardiogram complete 04/16/18 Study Conclusions - Left ventricle: The cavity size was normal. Systolic function was   vigorous. The estimated ejection fraction was in the range of 65%   to 70%. Wall motion was normal; there were no regional wall   motion abnormalities. - Aorta: Aortic root dimension: 40 mm (ED). - Ascending aorta: The ascending aorta was mildly dilated. - Left atrium: The atrium was mildly dilated. - Right ventricle: The cavity size was mildly dilated. Wall   thickness was normal. - Right atrium: The atrium was mildly dilated.    ASSESSMENT: Richard Vaughn is a 67 y.o. year old male here with [place] on 04/15/2018 secondary to hypertensive emergency. Vascular risk factors include HTN, HLD and smoking.     PLAN: -Repeat lipid panel and if cholesterol remains elevated, recommend to start TriCor as recommended by PCP -repeat CT scan for  ICH follow up and consider starting aspirin 81 mg if resolved -F/u with PCP regarding your HLD and HTN management -continue to monitor BP at home -Continue PT/OT/ST -Maintain strict control of hypertension with blood pressure goal below 130/90, diabetes with hemoglobin A1c goal below 6.5% and cholesterol with LDL cholesterol (bad cholesterol) goal below 70 mg/dL. I also advised the patient to eat a healthy diet with plenty of whole grains, cereals, fruits and vegetables, exercise regularly and maintain ideal body weight.  Follow up in 3 months or call earlier if needed   Greater than 50% of time during this 25 minute visit was spent on counseling,explanation of diagnosis of ICH, reviewing risk factor management of HLD and HTN, planning of further management, discussion with patient and family and coordination of care    Venancio Poisson, [facility]  [facility] [address] [place], [place] [address]  Phone [phone] Fax [phone]

## 2018-05-12 ENCOUNTER — Encounter: Payer: Self-pay | Admitting: Occupational Therapy

## 2018-05-12 ENCOUNTER — Ambulatory Visit: Payer: Federal, State, Local not specified - PPO

## 2018-05-12 ENCOUNTER — Ambulatory Visit: Payer: Federal, State, Local not specified - PPO | Admitting: Occupational Therapy

## 2018-05-12 DIAGNOSIS — R293 Abnormal posture: Secondary | ICD-10-CM

## 2018-05-12 DIAGNOSIS — R2681 Unsteadiness on feet: Secondary | ICD-10-CM

## 2018-05-12 DIAGNOSIS — R270 Ataxia, unspecified: Secondary | ICD-10-CM

## 2018-05-12 DIAGNOSIS — R29818 Other symptoms and signs involving the nervous system: Secondary | ICD-10-CM | POA: Diagnosis not present

## 2018-05-12 DIAGNOSIS — R2689 Other abnormalities of gait and mobility: Secondary | ICD-10-CM

## 2018-05-12 DIAGNOSIS — M6281 Muscle weakness (generalized): Secondary | ICD-10-CM

## 2018-05-12 DIAGNOSIS — R471 Dysarthria and anarthria: Secondary | ICD-10-CM | POA: Diagnosis not present

## 2018-05-12 DIAGNOSIS — R208 Other disturbances of skin sensation: Secondary | ICD-10-CM

## 2018-05-12 NOTE — Therapy (Signed)
[facility] [address] [place], [place], [address] Phone: [phone]   Fax:  [phone]  Physical Therapy Treatment  Patient Details  Name: Richard Vaughn MRN: [id number] Date of Birth: 1951-03-20 Referring Provider: Dr. Erlinda Hong   Encounter Date: 05/12/2018  PT End of Session - 05/12/18 1539    Visit Number  3    Number of Visits  17    Date for PT Re-Evaluation  06/24/18    Authorization Type  Federal BCBS: 75 visits combines-awaiting to see if aquatic therapy is covered    PT Start Time  1448 pt late    PT Stop Time  1528    PT Time Calculation (min)  40 min    Equipment Utilized During Treatment  -- min guard to S prn    Activity Tolerance  Patient tolerated treatment well    Behavior During Therapy  [facility] for tasks assessed/performed       Past Medical History:  Diagnosis Date  . Barrett's esophagus   . Chronic back pain   . Gastritis    Mild  . Gastroparesis   . GERD (gastroesophageal reflux disease)   . Headache   . Hyperglycemia   . Nephrolithiasis   . Renal cyst   . Stricture and stenosis of esophagus   . Stroke ([place]) 07-2015  . Vision abnormalities     Past Surgical History:  Procedure Laterality Date  . [place] SURGERY  2012  . COLONOSCOPY    . [place] SURGERY  2005, 2010   replaced L4 and L5  . UPPER GASTROINTESTINAL ENDOSCOPY      There were no vitals filed for this visit.  Subjective Assessment - 05/12/18 1451    Subjective  Pt denied falls since last visit. Pt went to aquatic therapy on Wednesday.     Patient is accompained by:  Family member    Pertinent History  HTN, chronic back pain, HLD, visual abnormalities, sleep apnea, PAD, hx of L BG CVA    Patient Stated Goals  Walk without RW and get back to water aerobics at the [facility]    Currently in Pain?  Yes    Pain Score  -- 4-5/10    Pain Location  Back    Pain Orientation  Lower    Pain Descriptors / Indicators   Aching    Pain Type  Chronic pain    Pain Radiating Towards  BLEs    Pain Onset  More than a month ago    Pain Frequency  Constant    Aggravating Factors   walking, standing too long    Pain Relieving Factors  sitting, resting          Therex and Neuro re-ed: Therex: clams, all other activities neuro re-ed.  Access Code: [email or web address]  URL: [email or web address]  Date: 05/12/2018  Prepared by: Geoffry Paradise   Exercises  Clamshell with Resistance - 10 reps - 3 sets - 2 hold - 1x daily - 3x weekly . Black theraband for RLE, no band for LLE. Backwards Walking - 4 reps - 1 sets - 1x daily - 5x weekly with UE support on counter. Sideways Walking - 4 reps - 1x daily - 5x weekly ,hovering hands at counter. Carioca with Counter Support - 4 reps - 1 sets - 1x daily - 5x weekly with UE support on counter.  Cues and demo for technique. No incr. In pain during session.  [place] Adult PT Treatment/Exercise - 05/12/18 1518      Ambulation/Gait   Ambulation/Gait  Yes    Ambulation/Gait Assistance  5: Supervision;4: Min guard    Ambulation/Gait Assistance Details  Pt amb. with cues to improve upright posture, L heel strike, trunk rotation, and reciprocal arm swing. Utilized walking poles with PT assisting arm swing for 230'.    Ambulation Distance (Feet)  230 Feet x3    Assistive device  None and walking poles (PT and pt holding poles)    Gait Pattern  Step-through pattern;Decreased stride length;Decreased dorsiflexion - left;Shuffle    Ambulation Surface  Level;Indoor             PT Education - 05/12/18 1538    Education Details  PT provided pt with high level balance activities and strengthening HEP. PT educated pt on importance of proper posture and performing shoulder retraction. PT encouraged pt to use AD while amb. outdoors but is safe to amb. indoors at home without RW.     Person(s) Educated  Patient;Spouse    Methods   Explanation;Demonstration;Verbal cues;Handout    Comprehension  Returned demonstration;Verbalized understanding       PT Short Term Goals - 04/25/18 1353      PT SHORT TERM GOAL #1   Title  Pt will be IND in HEP to improve deficits listed above. TARGET DATE FOR ALL STGS: 05/23/18    Status  New      PT SHORT TERM GOAL #2   Title  Pt will improve gait speed to >/=1.8 ft/sec with LRAD to reduce falls risk.     Status  New      PT SHORT TERM GOAL #3   Title  Pt will amb. 300' with LRAD at MOD I level to improve functional mobility.     Status  New      PT SHORT TERM GOAL #4   Title  Pt will improve TUG time to </=20 sec. with LRAD to reduce falls risk.     Status  New      PT SHORT TERM GOAL #5   Title  Pt will improve BERG score to >/=40/56 to decr. falls risk.     Status  New      Additional Short Term Goals   Additional Short Term Goals  Yes      PT SHORT TERM GOAL #6   Title  Pt will begin aquatic therapy to improve endurance, strength, and balance.     Status  New        PT Long Term Goals - 04/25/18 1355      PT LONG TERM GOAL #1   Title  Pt will verbalize understanding of CVA risk factors and s/s of CVA to reduce risk of another stroke. TARGET DATE FOR ALL LTGS: 06/20/18    Status  New      PT LONG TERM GOAL #2   Title  Pt will improve TUG time to </=13.5 sec. with LRAD to reduce falls risk.     Status  New      PT LONG TERM GOAL #3   Title  Pt will improve gait speed to >/=2.56ft/sec. with LRAD to safely amb. in the community.     Status  New      PT LONG TERM GOAL #4   Title  Pt will improve BERG score to >/=45/56 to decr. falls risk.     Status  New      PT LONG  TERM GOAL #5   Title  Pt will amb. 600' over even/uneven terrain with LRAD at MOD I level to improve functional mobility.     Status  New            Plan - 05/12/18 1539    Clinical Impression Statement  Pt demonstrated progress, as he was able to amb. safely over even terrain, indoors,  without AD. Pt demonstrated improve reciprocal arm swing after using walking poles (PT holding one end and pt holding front of poles). Pt noted to experience decr. L toe clearance during last bout of amb. 2/2 fatigue. PT also added high level balance and hip strengthening to HEP. Continue with POC.     Rehab Potential  Good    Clinical Impairments Affecting Rehab Potential  see above    PT Frequency  2x / week    PT Duration  8 weeks    PT Treatment/Interventions  ADLs/Self Care Home Management;Biofeedback;Canalith Repostioning;Electrical Stimulation;Therapeutic activities;Functional mobility training;Stair training;Gait training;Orthotic Fit/Training;Therapeutic exercise;Manual techniques;Vestibular;Patient/family education;DME Instruction;Neuromuscular re-education;Balance training aquatic therapy    PT Next Visit Plan  work on gait without AD as pt does not want to use RW, ? cane if needed. Add B shoulder retraction and cx retraction to HEP to improve posture.     Consulted and Agree with Plan of Care  Patient;Family member/caregiver    Family Member Consulted  wife       Patient will benefit from skilled therapeutic intervention in order to improve the following deficits and impairments:  Abnormal gait, Decreased endurance, Impaired sensation, Decreased strength, Decreased knowledge of use of DME, Impaired UE functional use, Decreased balance, Decreased mobility, Impaired flexibility, Postural dysfunction(PT will not directly treat back pain but will monitor closely)  Visit Diagnosis: Muscle weakness (generalized)  Unsteadiness on feet  Other abnormalities of gait and mobility     Problem List Patient Active Problem List   Diagnosis Date Noted  . Chronic pain syndrome   . Chronic bilateral low back pain   . Benign essential HTN   . Tobacco abuse   . Marijuana abuse   . Hyperlipidemia   . History of CVA with residual deficit   . Dysphagia, post-stroke   . ICH (intracerebral  hemorrhage) (HCC) - R thalmic/PLIC d/t HTN 66/59/9357  . Insomnia 07/13/2017  . PAD (peripheral artery disease) ([place]) 07/13/2017  . Hyperlipidemia with target LDL less than 70 07/13/2017  . Stroke-like symptom 09/22/2016  . Paresthesia 09/22/2016  . CVA (cerebral vascular accident) ([place]) 09/22/2016  . Cerebrovascular accident (CVA) due to thrombosis of left carotid artery ([place]) 09/23/2015  . Primary snoring 09/23/2015  . Hypersomnia with sleep apnea 09/23/2015  . Obstructive sleep apnea 09/23/2015  . Lacunar infarct, acute ([place]) 08/25/2015  . Sleep apnea 08/25/2015  . Dysarthria   . CVA (cerebral infarction) 08/02/2015  . Acute hyperglycemia 08/02/2015  . Systolic hypertension with cerebrovascular disease 12/27/2014  . Epistaxis 07/28/2012  . HYPERGLYCEMIA 10/02/2010  . NEPHROLITHIASIS 05/23/2009  . BARRETTS ESOPHAGUS 02/20/2009  . Gastroparesis 02/20/2009  . RADICULOPATHY 10/21/2008  . GERD 10/08/2008  . ESOPHAGITIS 08/15/2008  . ESOPHAGEAL STRICTURE 08/15/2008  . GASTRITIS, ACUTE 08/15/2008  . RENAL CYST 08/14/2008  . TOBACCO ABUSE 08/08/2008  . HEMATURIA, MICROSCOPIC, HX OF 08/08/2008  . PULMONARY NODULE 01/10/2008    Stefania Goulart L 05/12/2018, 3:42 PM  [facility] [address] [place], [place], [address] Phone: [phone]   Fax:  [phone]  Name: Richard Vaughn MRN: [id number]  Date of Birth: [birth year]  Geoffry Paradise, PT,DPT 05/12/18 3:44 PM Phone: [phone] Fax: [phone]

## 2018-05-12 NOTE — Patient Instructions (Signed)
Access Code: [email or web address]  URL: [email or web address]  Date: 05/12/2018  Prepared by: Geoffry Paradise   Exercises  Clamshell with Resistance - 10 reps - 3 sets - 2 hold - 1x daily - 3x weekly  Backwards Walking - 4 reps - 1 sets - 1x daily - 5x weekly  Sideways Walking - 4 reps - 1x daily - 5x weekly  Carioca with Counter Support - 4 reps - 1 sets - 1x daily - 5x weekly

## 2018-05-12 NOTE — Therapy (Signed)
[facility] [address] [place], [place], [address] Phone: [phone]   Fax:  [phone]  Occupational Therapy Treatment  Patient Details  Name: Richard Vaughn MRN: [id number] Date of Birth: 03/06/1951 Referring Provider: Dr. Erlinda Hong   Encounter Date: 05/12/2018  OT End of Session - 05/12/18 1642    Visit Number  4 OT count    Number of Visits  16    Date for OT Re-Evaluation  05/22/18    Authorization Type  BCBS - 75 visits for all disciplines. Counts as 1 visit if occurs on the same day. Pt approved for aquatic therapy    Authorization - Visit Number  6 Overall count    Authorization - Number of Visits  84    OT Start Time  6962    OT Stop Time  1615    OT Time Calculation (min)  40 min    Activity Tolerance  Patient tolerated treatment well    Behavior During Therapy  WFL for tasks assessed/performed       Past Medical History:  Diagnosis Date  . Barrett's esophagus   . Chronic back pain   . Gastritis    Mild  . Gastroparesis   . GERD (gastroesophageal reflux disease)   . Headache   . Hyperglycemia   . Nephrolithiasis   . Renal cyst   . Stricture and stenosis of esophagus   . Stroke ([place]) 07-2015  . Vision abnormalities     Past Surgical History:  Procedure Laterality Date  . [place] SURGERY  2012  . COLONOSCOPY    . [place] SURGERY  2005, 2010   replaced L4 and L5  . UPPER GASTROINTESTINAL ENDOSCOPY      There were no vitals filed for this visit.  Subjective Assessment - 05/12/18 1620    Subjective   She said I shouldn't go in the pool by myself yet.      Patient is accompained by:  Family member    Pertinent History  R BG CVA.  Pt has h/o of chronic low back pain prior to stroke    Patient Stated Goals  To walk better and get stronger    Currently in Pain?  Yes    Pain Score  4     Pain Location  Neck    Pain Orientation  Posterior    Pain Descriptors / Indicators  Aching    Pain Type  Chronic pain    Pain Onset  More than a month ago    Pain Frequency  Constant                   OT Treatments/Exercises (OP) - 05/12/18 0001      ADLs   Cooking  Patient is increasing attempts to functionally use his left hand.  His wife indicated that he was able to carry his plate from the kitchen to his man cave yesterday.  Patient carried a full mug of water in the kitchen - needed cues for cadence and step length when challenging left hand function.  In quiet (not busy) clinic environment- able to effectively carry mug by the handle, glass of water, and plate with min cueing to manage left arm while walking at more natural pace.      Writing  Patient indicates thar typing is still challenging for left hand      Neurological Re-education Exercises   Other Exercises 1  Neuromuscular reeducation to address closed to  open chain motion in left UE, postural control, and gross motor coordination.  Modified plantigrade forward stretch, and modified plantigrade for partial standing push up.  Able to lift right arm off surface and rotate body to increase relative abduction left GH joint.             OT Education - 05/12/18 1640    Education Details  Tuck elbow towards trunk to stabilize with left when carrying     Person(s) Educated  Patient;Spouse    Methods  Explanation;Demonstration    Comprehension  Verbalized understanding;Returned demonstration       OT Short Term Goals - 05/12/18 1645      OT SHORT TERM GOAL #1   Title  Pt will be mod I with HEP for coordination for LUE, balance -goals due  05/23/2018    Status  On-going      OT SHORT TERM GOAL #2   Title  Pt will be mod I with tying shoes    Status  Achieved      OT SHORT TERM GOAL #3   Title  Pt will demonstrate improved coordination as evidenced by decreasing time on 9 hole peg with LUE by at least 7 seven seconds to assist with functional fine motor tasks.     Status  On-going      OT SHORT TERM  GOAL #4   Title  Pt will be mod I with shaving    Status  On-going      OT SHORT TERM GOAL #5   Title  Pt will be supervision with shower transfers    Status  Achieved      OT SHORT TERM GOAL #6   Title  Pt will be mod I with simple snack/cold meal prep     Status  On-going      OT SHORT TERM GOAL #7   Title  Pt will be mod I with loading dishwasher/washing dishes    Status  On-going        OT Long Term Goals - 05/10/18 1739      OT LONG TERM GOAL #1   Title  Pt will be mod I with upgraded HEP - goals due 06/20/2018    Status  On-going      OT LONG TERM GOAL #2   Title  Pt will be mod I with shower transfers    Status  On-going      OT LONG TERM GOAL #3   Title  Pt will be mod I with hot meal prep with at least two items.    Status  On-going      OT LONG TERM GOAL #4   Title  Pt demonstrate improved coordination as evidenced by decreasing time on 9 hole peg by at least 10 seconds with LUE.     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     Baseline  baseline = 29    Status  On-going      OT LONG TERM  GOAL #7   Title  Pt will return to water fitness class at [place] #8   Title  Pt will be mod I with familiar home mgmt tasks    Status  On-going            Plan - 05/12/18 1644    Clinical Impression Statement  Patient is showing steady progress, and has met some of his short term goals.  Patient reports relief of pain following aquatics was significantly longer than following land based therapy.      Occupational Profile and client history currently impacting functional  performance  PMH:  L BG CVA, chronic low back pain, HLD, Barrents esophagus, dysphagia, HTN, sleep apnea,     Occupational performance deficits (Please refer to evaluation for details):  ADL's;IADL's;Rest and Sleep;Leisure;Social Participation    Rehab Potential  Good    Current Impairments/barriers affecting progress:  will monitor cognition    OT Frequency  2x / week    OT Duration  8 weeks    OT Treatment/Interventions  Self-care/ADL training;Aquatic Therapy;Moist Heat;Electrical Stimulation;Fluidtherapy;DME and/or AE instruction;Neuromuscular education;Therapeutic exercise;Functional Mobility Training;Manual Therapy;Passive range of motion;Therapeutic activities;Patient/family education;Balance training    Plan   NMR for balance, motor planning , functional use of LUE, weight bearing for ataxia    Clinical Decision Making  Multiple treatment options, significant modification of task necessary    Consulted and Agree with Plan of Care  Patient;Family member/caregiver    Family Member Consulted  wife [name]       Patient will benefit from skilled therapeutic intervention in order to improve the following deficits and impairments:  Abnormal gait, Decreased activity tolerance, Decreased balance, Decreased coordination, Decreased mobility, Difficulty walking, Impaired UE functional use, Impaired sensation  Visit Diagnosis: Ataxia  Abnormal posture  Other disturbances of skin sensation  Unsteadiness on feet  Muscle weakness (generalized)  Other symptoms and signs involving the nervous system    Problem List Patient Active Problem List   Diagnosis Date Noted  . Chronic pain syndrome   . Chronic bilateral low back pain   . Benign essential HTN   . Tobacco abuse   . Marijuana abuse   . Hyperlipidemia   . History of CVA with residual deficit   . Dysphagia, post-stroke   . ICH (intracerebral hemorrhage) (HCC) - R thalmic/PLIC d/t HTN 75/17/0017  . Insomnia 07/13/2017  . PAD  (peripheral artery disease) ([place]) 07/13/2017  . Hyperlipidemia with target LDL less than 70 07/13/2017  . Stroke-like symptom 09/22/2016  . Paresthesia 09/22/2016  . CVA (cerebral vascular accident) ([place]) 09/22/2016  . Cerebrovascular accident (CVA) due to thrombosis of left carotid artery ([place]) 09/23/2015  . Primary snoring 09/23/2015  . Hypersomnia with sleep apnea 09/23/2015  . Obstructive sleep apnea 09/23/2015  . Lacunar infarct, acute ([place]) 08/25/2015  . Sleep apnea 08/25/2015  . Dysarthria   . CVA (cerebral infarction) 08/02/2015  . Acute hyperglycemia 08/02/2015  . Systolic hypertension with cerebrovascular disease 12/27/2014  . Epistaxis 07/28/2012  . HYPERGLYCEMIA 10/02/2010  . NEPHROLITHIASIS 05/23/2009  . BARRETTS ESOPHAGUS 02/20/2009  . Gastroparesis 02/20/2009  . RADICULOPATHY 10/21/2008  . GERD 10/08/2008  . ESOPHAGITIS 08/15/2008  . ESOPHAGEAL STRICTURE 08/15/2008  . GASTRITIS, ACUTE 08/15/2008  . RENAL CYST 08/14/2008  . TOBACCO ABUSE 08/08/2008  . HEMATURIA, MICROSCOPIC, HX OF 08/08/2008  . PULMONARY NODULE 01/10/2008    Mariah Milling, OTR/L 05/12/2018, 4:47 PM  [facility] [address] [place], [place], [address] Phone: [phone]   Fax:  [phone]  Name: Richard Vaughn MRN: [id number] Date of Birth: [birth year]

## 2018-05-15 ENCOUNTER — Encounter: Payer: Self-pay | Admitting: Adult Health

## 2018-05-15 ENCOUNTER — Ambulatory Visit: Payer: Federal, State, Local not specified - PPO | Admitting: Adult Health

## 2018-05-15 ENCOUNTER — Telehealth: Payer: Self-pay | Admitting: Adult Health

## 2018-05-15 VITALS — BP 122/82 | HR 79 | Ht 74.0 in | Wt 262.0 lb

## 2018-05-15 DIAGNOSIS — R471 Dysarthria and anarthria: Secondary | ICD-10-CM | POA: Diagnosis not present

## 2018-05-15 DIAGNOSIS — E7800 Pure hypercholesterolemia, unspecified: Secondary | ICD-10-CM

## 2018-05-15 DIAGNOSIS — I610 Nontraumatic intracerebral hemorrhage in hemisphere, subcortical: Secondary | ICD-10-CM | POA: Diagnosis not present

## 2018-05-15 DIAGNOSIS — I10 Essential (primary) hypertension: Secondary | ICD-10-CM

## 2018-05-15 NOTE — Telephone Encounter (Signed)
BCBS Fed order sent to GI. They will reach out to the pt to schedule.

## 2018-05-15 NOTE — Patient Instructions (Signed)
We will recheck CT scan and we will restart aspirin if bleed resolved  We will recheck cholesterol levels today to ensure hospital numbers are correct and consider started Tricor if cholesterol numbers remain elevated as prescribed by PCP  Continue therapies and if you need additional orders please let us know   Continue to follow up with PCP regarding cholesterol and blood pressure management   Continue to monitor blood pressure at home  Maintain strict control of hypertension with blood pressure goal below 130/90, diabetes with hemoglobin A1c goal below 6.5% and cholesterol with LDL cholesterol (bad cholesterol) goal below 70 mg/dL. I also advised the patient to eat a healthy diet with plenty of whole grains, cereals, fruits and vegetables, exercise regularly and maintain ideal body weight.  Followup in the future with me in 3 months or call earlier if needed       Thank you for coming to see [place] at [facility]. I hope we have been able to provide you high quality care today.  You may receive a patient satisfaction survey over the next few weeks. We would appreciate your feedback and comments so that we may continue to improve ourselves and the health of our patients.

## 2018-05-16 ENCOUNTER — Ambulatory Visit: Payer: Federal, State, Local not specified - PPO | Admitting: Occupational Therapy

## 2018-05-16 ENCOUNTER — Telehealth: Payer: Self-pay

## 2018-05-16 ENCOUNTER — Encounter: Payer: Self-pay | Admitting: Occupational Therapy

## 2018-05-16 ENCOUNTER — Encounter: Payer: Self-pay | Admitting: Rehabilitation

## 2018-05-16 ENCOUNTER — Ambulatory Visit: Payer: Federal, State, Local not specified - PPO | Attending: Neurology | Admitting: Rehabilitation

## 2018-05-16 DIAGNOSIS — R2681 Unsteadiness on feet: Secondary | ICD-10-CM

## 2018-05-16 DIAGNOSIS — R208 Other disturbances of skin sensation: Secondary | ICD-10-CM

## 2018-05-16 DIAGNOSIS — R471 Dysarthria and anarthria: Secondary | ICD-10-CM | POA: Insufficient documentation

## 2018-05-16 DIAGNOSIS — R270 Ataxia, unspecified: Secondary | ICD-10-CM | POA: Diagnosis not present

## 2018-05-16 DIAGNOSIS — M6281 Muscle weakness (generalized): Secondary | ICD-10-CM | POA: Diagnosis not present

## 2018-05-16 DIAGNOSIS — R293 Abnormal posture: Secondary | ICD-10-CM | POA: Diagnosis not present

## 2018-05-16 DIAGNOSIS — R29818 Other symptoms and signs involving the nervous system: Secondary | ICD-10-CM | POA: Diagnosis not present

## 2018-05-16 DIAGNOSIS — R2689 Other abnormalities of gait and mobility: Secondary | ICD-10-CM | POA: Diagnosis not present

## 2018-05-16 LAB — LIPID PANEL
CHOL/HDL RATIO: 4.7 ratio (ref 0.0–5.0)
Cholesterol, Total: 142 mg/dL (ref 100–199)
HDL: 30 mg/dL — ABNORMAL LOW (ref >39)
LDL CALC: 87 mg/dL (ref 0–99)
Triglycerides: 127 mg/dL (ref 0–149)
VLDL CHOLESTEROL CAL: 25 mg/dL (ref 5–40)

## 2018-05-16 NOTE — Telephone Encounter (Signed)
Rn call patient about lab work. Rn stated cholesterol panel showed an LDL of 87, HDL of 30 and triglycerides of 127. Due to his LDL being greater than 70, she recommend starting a statin. He does have statin intolerance in the past (atorvastatin and pravastatin) so its recommend starting Crestor at this time. If patient is in agreement, I will send this medication to his pharmacy or he can start his prescription from his PCP of fenofibrate and have PCP recheck cholesterol panel in 6-8 weeks. Pt stated he will speak with his wife about which one to take. PT verbalized understanding, and will call back. Rn remind pt we are close thu,and Friday for the holidays. ------

## 2018-05-16 NOTE — Telephone Encounter (Signed)
Notes recorded by Marval Regal, RN on 05/16/2018 at 9:39 AM EDT :Left vm for patient to call back about lab work and medication management for cholesterol.

## 2018-05-16 NOTE — Patient Instructions (Signed)
Access Code: [email or web address]  URL: [email or web address]  Date: 05/16/2018  Prepared by: Cameron Sprang   Exercises  Modified [place] - 3 reps - 1 sets - 30 hold - 1x daily - 5x weekly  Supine Hamstring Stretch with Strap - 3 reps - 1 sets - 30 hold - 1x daily - 5x weekly  Narrow Stance with Eyes Closed and Head Rotation on Foam Pad - 10 reps - 3 sets - 1x daily - 5x weekly  Narrow Stance with Eyes Closed and Head Nods on Foam Pad - 10 reps - 3 sets - 1x daily - 5x weekly  Clamshell with Resistance - 10 reps - 3 sets - 2 hold - 1x daily - 3x weekly  Walking March - 20 reps - 2 sets - 1x daily - 7x weekly  Carioca with Counter Support - 4 reps - 1 sets - 1x daily - 5x weekly  Standing to Half-Kneeling - 10 reps - 1 sets - 1x daily - 7x weekly

## 2018-05-16 NOTE — Telephone Encounter (Signed)
-----   Message from Venancio Poisson, NP sent at 05/16/2018  8:08 AM EDT ----- Please notify patient that his cholesterol panel showed an LDL of 87, HDL of 30 and triglycerides of 127. Due to his LDL being greater than 70, I would recommend starting a statin. He does have statin intolerance in the past (atorvastatin and pravastatin) so I would recommend starting Crestor at this time. If patient is in agreement, I will send this medication to his pharmacy or he can start his prescription from his PCP of fenofibrate and have PCP recheck cholesterol panel in 6-8 weeks. Thank you.

## 2018-05-16 NOTE — Therapy (Signed)
[facility] [address] [place], [place], [address] Phone: [phone]   Fax:  [phone]  Physical Therapy Treatment  Patient Details  Name: Richard Vaughn MRN: [id number] Date of Birth: 1951/05/19 Referring Provider: Dr. Erlinda Hong   Encounter Date: 05/16/2018  PT End of Session - 05/16/18 0936    Visit Number  4    Number of Visits  17    Date for PT Re-Evaluation  06/24/18    Authorization Type  Federal BCBS: 75 visits combines-awaiting to see if aquatic therapy is covered    PT Start Time  0932    PT Stop Time  1015    PT Time Calculation (min)  43 min    Equipment Utilized During Treatment  -- min guard to S prn    Activity Tolerance  Patient tolerated treatment well    Behavior During Therapy  [facility] for tasks assessed/performed       Past Medical History:  Diagnosis Date  . Barrett's esophagus   . Chronic back pain   . Gastritis    Mild  . Gastroparesis   . GERD (gastroesophageal reflux disease)   . Headache   . Hyperglycemia   . Nephrolithiasis   . Renal cyst   . Stricture and stenosis of esophagus   . Stroke ([place]) 07-2015  . Vision abnormalities     Past Surgical History:  Procedure Laterality Date  . [place] SURGERY  2012  . COLONOSCOPY    . [place] SURGERY  2005, 2010   replaced L4 and L5  . UPPER GASTROINTESTINAL ENDOSCOPY      There were no vitals filed for this visit.  Subjective Assessment - 05/16/18 0935    Subjective  Pt reports doing well since last visit, no falls.  Some back pain.      Patient is accompained by:  Family member    Pertinent History  HTN, chronic back pain, HLD, visual abnormalities, sleep apnea, PAD, hx of L BG CVA    Patient Stated Goals  Walk without RW and get back to water aerobics at the [facility]    Currently in Pain?  Yes    Pain Score  4     Pain Location  Back    Pain Orientation  Mid;Lower    Pain Descriptors / Indicators  Aching    Pain Type   Chronic pain    Pain Onset  More than a month ago    Pain Frequency  Constant    Aggravating Factors   walking, standing too long    Pain Relieving Factors  sitting and resting                       [facility] Adult PT Treatment/Exercise - 05/16/18 0957      Neuro Re-ed    Neuro Re-ed Details   High level balance in // bars, SLS on small rocker board x 2 sets of 15 secs progressing to tapping RLE to yoga block and back to board lightly x 10 reps with intermittent support, SLS on BOSU again maintaining balance x 2 sets of 15 secs progressing to tapping RLE from BOSU to yoga block with intermittent support and cues for improved L ankle control, agility ladder working on maintaining plantar flexed position landing each foot to step with emphasis on lightly landing and increasing knee flexion to do so without increase in back pain, side stepping quick steps staying on toes  landing each foot to step (both require min A but with repetition he is able to perform with marked control of LLE x each exercise by 6 laps), outdoor challenges on grass to adddress LLE motor control and high level balance along with stepping strategy: marching fowards without UE support x 20', walking forwards with head turns/nods x 20' each, braiding with light UE support from PT x 20'-added marching and braiding to HEP, see pt instruction).  Also had pt stand on cement parking barrier (bumper) perpendicularly maintaining balance x 20 secs, tapping R heel to ground and back to bumper x 10 reps with intermittent min A from PT.  Performed transitional movement from stand>half kneel>tall kneeling and back to stand x 5 reps on each side with intermittent UE support to improve WB and forced use of LLE to decrease ataxic movements during gait.  Added to HEP for strengthening and NMR.          Access Code: [email or web address]  URL: [email or web address]  Date: 05/16/2018  Prepared by: Cameron Sprang   Exercises  Modified  [place] - 3 reps - 1 sets - 30 hold - 1x daily - 5x weekly  Supine Hamstring Stretch with Strap - 3 reps - 1 sets - 30 hold - 1x daily - 5x weekly  Narrow Stance with Eyes Closed and Head Rotation on Foam Pad - 10 reps - 3 sets - 1x daily - 5x weekly  Narrow Stance with Eyes Closed and Head Nods on Foam Pad - 10 reps - 3 sets - 1x daily - 5x weekly  Clamshell with Resistance - 10 reps - 3 sets - 2 hold - 1x daily - 3x weekly  Walking March - 20 reps - 2 sets - 1x daily - 7x weekly  Carioca with Counter Support - 4 reps - 1 sets - 1x daily - 5x weekly  Standing to Half-Kneeling - 10 reps - 1 sets - 1x daily - 7x weekly       PT Education - 05/16/18 1249    Education Details  updated HEP    Person(s) Educated  Patient;Spouse    Methods  Explanation;Demonstration;Handout    Comprehension  Verbalized understanding;Returned demonstration       PT Short Term Goals - 04/25/18 1353      PT SHORT TERM GOAL #1   Title  Pt will be IND in HEP to improve deficits listed above. TARGET DATE FOR ALL STGS: 05/23/18    Status  New      PT SHORT TERM GOAL #2   Title  Pt will improve gait speed to >/=1.8 ft/sec with LRAD to reduce falls risk.     Status  New      PT SHORT TERM GOAL #3   Title  Pt will amb. 300' with LRAD at MOD I level to improve functional mobility.     Status  New      PT SHORT TERM GOAL #4   Title  Pt will improve TUG time to </=20 sec. with LRAD to reduce falls risk.     Status  New      PT SHORT TERM GOAL #5   Title  Pt will improve BERG score to >/=40/56 to decr. falls risk.     Status  New      Additional Short Term Goals   Additional Short Term Goals  Yes      PT SHORT TERM GOAL #6   Title  Pt will begin aquatic therapy  to improve endurance, strength, and balance.     Status  New        PT Long Term Goals - 04/25/18 1355      PT LONG TERM GOAL #1   Title  Pt will verbalize understanding of CVA risk factors and s/s of CVA to reduce risk of another  stroke. TARGET DATE FOR ALL LTGS: 06/20/18    Status  New      PT LONG TERM GOAL #2   Title  Pt will improve TUG time to </=13.5 sec. with LRAD to reduce falls risk.     Status  New      PT LONG TERM GOAL #3   Title  Pt will improve gait speed to >/=2.55ft/sec. with LRAD to safely amb. in the community.     Status  New      PT LONG TERM GOAL #4   Title  Pt will improve BERG score to >/=45/56 to decr. falls risk.     Status  New      PT LONG TERM GOAL #5   Title  Pt will amb. 600' over even/uneven terrain with LRAD at MOD I level to improve functional mobility.     Status  New            Plan - 05/16/18 5277    Clinical Impression Statement  Skilled session continues to address high level balance and gait tasks over uneven surfaces, indoors/outdoors, elicitation of hip and stepping strategy along with forced use and WB through LLE.  Pt making excellent progress, therefore updated balance tasks on HEP, see note.     Rehab Potential  Good    Clinical Impairments Affecting Rehab Potential  see above    PT Frequency  2x / week    PT Duration  8 weeks    PT Treatment/Interventions  ADLs/Self Care Home Management;Biofeedback;Canalith Repostioning;Electrical Stimulation;Therapeutic activities;Functional mobility training;Stair training;Gait training;Orthotic Fit/Training;Therapeutic exercise;Manual techniques;Vestibular;Patient/family education;DME Instruction;Neuromuscular re-education;Balance training aquatic therapy    PT Next Visit Plan  begin to look at STG, work on gait without AD, hip and stepping strategy, L ankle stabilization, Add B shoulder retraction and cx retraction to HEP to improve posture.     Consulted and Agree with Plan of Care  Patient;Family member/caregiver    Family Member Consulted  wife       Patient will benefit from skilled therapeutic intervention in order to improve the following deficits and impairments:  Abnormal gait, Decreased endurance, Impaired  sensation, Decreased strength, Decreased knowledge of use of DME, Impaired UE functional use, Decreased balance, Decreased mobility, Impaired flexibility, Postural dysfunction(PT will not directly treat back pain but will monitor closely)  Visit Diagnosis: Unsteadiness on feet  Muscle weakness (generalized)  Other abnormalities of gait and mobility  Other disturbances of skin sensation     Problem List Patient Active Problem List   Diagnosis Date Noted  . Chronic pain syndrome   . Chronic bilateral low back pain   . Benign essential HTN   . Tobacco abuse   . Marijuana abuse   . Hyperlipidemia   . History of CVA with residual deficit   . Dysphagia, post-stroke   . ICH (intracerebral hemorrhage) (HCC) - R thalmic/PLIC d/t HTN 82/42/3536  . Insomnia 07/13/2017  . PAD (peripheral artery disease) ([place]) 07/13/2017  . Hyperlipidemia with target LDL less than 70 07/13/2017  . Stroke-like symptom 09/22/2016  . Paresthesia 09/22/2016  . CVA (cerebral vascular accident) ([place]) 09/22/2016  . Cerebrovascular accident (CVA)  due to thrombosis of left carotid artery ([place]) 09/23/2015  . Primary snoring 09/23/2015  . Hypersomnia with sleep apnea 09/23/2015  . Obstructive sleep apnea 09/23/2015  . Lacunar infarct, acute ([place]) 08/25/2015  . Sleep apnea 08/25/2015  . Dysarthria   . CVA (cerebral infarction) 08/02/2015  . Acute hyperglycemia 08/02/2015  . Systolic hypertension with cerebrovascular disease 12/27/2014  . Epistaxis 07/28/2012  . HYPERGLYCEMIA 10/02/2010  . NEPHROLITHIASIS 05/23/2009  . BARRETTS ESOPHAGUS 02/20/2009  . Gastroparesis 02/20/2009  . RADICULOPATHY 10/21/2008  . GERD 10/08/2008  . ESOPHAGITIS 08/15/2008  . ESOPHAGEAL STRICTURE 08/15/2008  . GASTRITIS, ACUTE 08/15/2008  . RENAL CYST 08/14/2008  . TOBACCO ABUSE 08/08/2008  . HEMATURIA, MICROSCOPIC, HX OF 08/08/2008  . PULMONARY NODULE 01/10/2008    Cameron Sprang, PT, MPT [facility] [address] [place], [place], [address] Phone: [phone]   Fax:  [phone] 05/16/18, 12:56 PM  Name: Richard Vaughn MRN: [id number] Date of Birth: [birth year]

## 2018-05-16 NOTE — Therapy (Signed)
[facility] [address] [place], [place], [address] Phone: [phone]   Fax:  [phone]  Occupational Therapy Treatment  Patient Details  Name: Richard Vaughn MRN: [id number] Date of Birth: 1951-01-10 Referring Provider: Dr. Erlinda Hong   Encounter Date: 05/16/2018  OT End of Session - 05/16/18 0952    Visit Number  5    Number of Visits  16    Date for OT Re-Evaluation  05/22/18    Authorization Type  [employer] - 75 visits for all disciplines. Counts as 1 visit if occurs on the same day. Pt approved for aquatic therapy    Authorization - Visit Number  7    Authorization - Number of Visits  38    OT Start Time  0848    OT Stop Time  0930    OT Time Calculation (min)  42 min    Activity Tolerance  Patient tolerated treatment well       Past Medical History:  Diagnosis Date  . Barrett's esophagus   . Chronic back pain   . Gastritis    Mild  . Gastroparesis   . GERD (gastroesophageal reflux disease)   . Headache   . Hyperglycemia   . Nephrolithiasis   . Renal cyst   . Stricture and stenosis of esophagus   . Stroke ([place]) 07-2015  . Vision abnormalities     Past Surgical History:  Procedure Laterality Date  . [place] SURGERY  2012  . COLONOSCOPY    . [place] SURGERY  2005, 2010   replaced L4 and L5  . UPPER GASTROINTESTINAL ENDOSCOPY      There were no vitals filed for this visit.  Subjective Assessment - 05/16/18 0852    Subjective   After the pool I stayed pain free until the next morning.     Patient is accompained by:  Family member wife [name]    Pertinent History  R BG CVA.  Pt has h/o of chronic low back pain prior to stroke    Patient Stated Goals  To walk better and get stronger    Currently in Pain?  No/denies    Pain Score  4     Pain Location  Back    Pain Orientation  Posterior    Pain Descriptors / Indicators  Aching    Pain Type  Chronic pain    Pain Onset  More than a month ago     Pain Frequency  Constant    Aggravating Factors   walking, standing too long    Pain Relieving Factors  sittng, resting.                   OT Treatments/Exercises (OP) - 05/16/18 0001      Neurological Re-education Exercises   Other Exercises 1  Neuro re ed to address fine motor abilty to pick up small items for task as well as in hand manipulation task using Grooved Peg board. Initially, ataxia interfered with LUE control.  Had pt practice weight bearing through BUE's on table (10 reps, holding for slow count of 5) with signficant improvement in performance. Pt and wife educated on how to use weight bearing as strategy to improve LUE control.  Neuro re ed to also address dynamic standing balance  - pt has all 3 postural reactions available (ankle, hip and step) however hip and step on LLE are delayed.  With practice and repetiton working on narrow BOS pt improved  in using hip strategy, even with feet together. Progressed then to modified tandem with LLE behind and pt today able to use hip strategy after practice maintain balance with simple reaching activity.  Also addresed functional ambulation at increased speed with arm swing (both facilitated) -  pt with some increased discontrol of LLE however able to maintain balance with close supervision.  Wife to have pt pace with her (vs wife pacing to pt's speed) while holding hands in the community to allow for steadying assist as needed.                 OT Short Term Goals - 05/16/18 0951      OT SHORT TERM GOAL #1   Title  Pt will be mod I with HEP for coordination for LUE, balance -goals due  05/23/2018    Status  On-going      OT SHORT TERM GOAL #2   Title  Pt will be mod I with tying shoes    Status  Achieved      OT SHORT TERM GOAL #3   Title  Pt will demonstrate improved coordination as evidenced by decreasing time on 9 hole peg with LUE by at least 7 seven seconds to assist with functional fine motor tasks.      Status  On-going      OT SHORT TERM GOAL #4   Title  Pt will be mod I with shaving    Status  Achieved      OT SHORT TERM GOAL #5   Title  Pt will be supervision with shower transfers    Status  Achieved      OT SHORT TERM GOAL #6   Title  Pt will be mod I with simple snack/cold meal prep     Status  On-going      OT SHORT TERM GOAL #7   Title  Pt will be mod I with loading dishwasher/washing dishes    Status  On-going        OT Long Term Goals - 05/16/18 0951      OT LONG TERM GOAL #1   Title  Pt will be mod I with upgraded HEP - goals due 06/20/2018    Status  On-going      OT LONG TERM GOAL #2   Title  Pt will be mod I with shower transfers    Status  On-going      OT LONG TERM GOAL #3   Title  Pt will be mod I with hot meal prep with at least two items.    Status  On-going      OT LONG TERM GOAL #4   Title  Pt demonstrate improved coordination as evidenced by decreasing time on 9 hole peg by at least 10 seconds with LUE.     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     Baseline  baseline = 29    Status  On-going      OT LONG  TERM GOAL #7   Title  Pt will return to water fitness class at [facility]    Status  On-going      OT LONG TERM GOAL #8   Title  Pt will be mod I with familiar home mgmt tasks    Status  On-going            Plan - 05/16/18 5093    Clinical Impression Statement  Pt progress rapidly toward goals. Pt demonstrating improvement in LUE function as well as balance and functional mobility    Occupational Profile and client history currently impacting functional performance  PMH:  L BG CVA,  chronic low back pain, HLD, Barrents esophagus, dysphagia, HTN, sleep apnea,     Occupational performance deficits (Please refer to evaluation for details):  ADL's;IADL's;Rest and Sleep;Leisure;Social Participation    Rehab Potential  Good    Current Impairments/barriers affecting progress:  will monitor cognition    OT Frequency  2x / week    OT Duration  8 weeks    OT Treatment/Interventions  Self-care/ADL training;Aquatic Therapy;Moist Heat;Electrical Stimulation;Fluidtherapy;DME and/or AE instruction;Neuromuscular education;Therapeutic exercise;Functional Mobility Training;Manual Therapy;Passive range of motion;Therapeutic activities;Patient/family education;Balance training    Plan   NMR for balance, motor planning , functional use of LUE, weight bearing for ataxia    Consulted and Agree with Plan of Care  Patient;Family member/caregiver    Family Member Consulted  wife [name]       Patient will benefit from skilled therapeutic intervention in order to improve the following deficits and impairments:  Abnormal gait, Decreased activity tolerance, Decreased balance, Decreased coordination, Decreased mobility, Difficulty walking, Impaired UE functional use, Impaired sensation  Visit Diagnosis: Ataxia  Abnormal posture  Other disturbances of skin sensation  Unsteadiness on feet  Muscle weakness (generalized)  Other symptoms and signs involving the nervous system    Problem List Patient Active Problem List   Diagnosis Date Noted  . Chronic pain syndrome   . Chronic bilateral low back pain   . Benign essential HTN   . Tobacco abuse   . Marijuana abuse   . Hyperlipidemia   . History of CVA with residual deficit   . Dysphagia, post-stroke   . ICH (intracerebral hemorrhage) (HCC) - R thalmic/PLIC d/t HTN 26/71/2458  . Insomnia 07/13/2017  . PAD (peripheral artery disease) ([place]) 07/13/2017  . Hyperlipidemia with target LDL less than 70 07/13/2017  . Stroke-like symptom  09/22/2016  . Paresthesia 09/22/2016  . CVA (cerebral vascular accident) ([place]) 09/22/2016  . Cerebrovascular accident (CVA) due to thrombosis of left carotid artery ([place]) 09/23/2015  . Primary snoring 09/23/2015  . Hypersomnia with sleep apnea 09/23/2015  . Obstructive sleep apnea 09/23/2015  . Lacunar infarct, acute ([place]) 08/25/2015  . Sleep apnea 08/25/2015  . Dysarthria   . CVA (cerebral infarction) 08/02/2015  . Acute hyperglycemia 08/02/2015  . Systolic hypertension with cerebrovascular disease 12/27/2014  . Epistaxis 07/28/2012  . HYPERGLYCEMIA 10/02/2010  . NEPHROLITHIASIS 05/23/2009  . BARRETTS ESOPHAGUS 02/20/2009  . Gastroparesis 02/20/2009  . RADICULOPATHY 10/21/2008  . GERD 10/08/2008  . ESOPHAGITIS 08/15/2008  . ESOPHAGEAL STRICTURE 08/15/2008  . GASTRITIS, ACUTE 08/15/2008  . RENAL CYST 08/14/2008  . TOBACCO ABUSE 08/08/2008  . HEMATURIA, MICROSCOPIC, HX OF 08/08/2008  . PULMONARY NODULE 01/10/2008    Quay Burow, OTR/L 05/16/2018, 9:54 AM  [facility] [address] [place], [place], [address] Phone: [phone]   Fax:  [phone]  Name: AMAURIE WANDEL MRN: [id number] Date  of Birth: Apr 04, 1951

## 2018-05-17 ENCOUNTER — Ambulatory Visit: Payer: Federal, State, Local not specified - PPO | Admitting: Occupational Therapy

## 2018-05-17 ENCOUNTER — Ambulatory Visit: Payer: Federal, State, Local not specified - PPO | Admitting: Physical Therapy

## 2018-05-17 ENCOUNTER — Encounter: Payer: Federal, State, Local not specified - PPO | Admitting: Occupational Therapy

## 2018-05-17 ENCOUNTER — Encounter: Payer: Self-pay | Admitting: Physical Therapy

## 2018-05-17 ENCOUNTER — Telehealth: Payer: Self-pay

## 2018-05-17 DIAGNOSIS — R293 Abnormal posture: Secondary | ICD-10-CM | POA: Diagnosis not present

## 2018-05-17 DIAGNOSIS — M6281 Muscle weakness (generalized): Secondary | ICD-10-CM | POA: Diagnosis not present

## 2018-05-17 DIAGNOSIS — R270 Ataxia, unspecified: Secondary | ICD-10-CM

## 2018-05-17 DIAGNOSIS — R2681 Unsteadiness on feet: Secondary | ICD-10-CM

## 2018-05-17 DIAGNOSIS — R2689 Other abnormalities of gait and mobility: Secondary | ICD-10-CM | POA: Diagnosis not present

## 2018-05-17 DIAGNOSIS — R208 Other disturbances of skin sensation: Secondary | ICD-10-CM

## 2018-05-17 DIAGNOSIS — R471 Dysarthria and anarthria: Secondary | ICD-10-CM | POA: Diagnosis not present

## 2018-05-17 DIAGNOSIS — R29818 Other symptoms and signs involving the nervous system: Secondary | ICD-10-CM | POA: Diagnosis not present

## 2018-05-17 NOTE — Telephone Encounter (Signed)
We have attempted to call the patient three times to schedule sleep study.  Patient has been unavailable at the phone numbers we have on file and has not returned our calls.  At this point we will send a letter asking patient to please contact the sleep lab to schedule their sleep study.  If patient calls back we will schedule them for their sleep study.  

## 2018-05-17 NOTE — Therapy (Signed)
[facility] [address] [place], [place], [address] Phone: [phone]   Fax:  [phone]  Occupational Therapy Treatment  Patient Details  Name: Richard Vaughn MRN: [id number] Date of Birth: [birth year] Referring Provider: Dr. Erlinda Hong   Encounter Date: 05/17/2018  OT End of Session - 05/17/18 1046    Visit Number  6    Number of Visits  16    Date for OT Re-Evaluation  05/22/18    Authorization Type  [employer] - 75 visits for all disciplines. Counts as 1 visit if occurs on the same day. Pt approved for aquatic therapy    Authorization - Visit Number  8    Authorization - Number of Visits  42    OT Start Time  1022    OT Stop Time  1100    OT Time Calculation (min)  38 min    Activity Tolerance  Patient tolerated treatment well    Behavior During Therapy  WFL for tasks assessed/performed       Past Medical History:  Diagnosis Date  . Barrett's esophagus   . Chronic back pain   . Gastritis    Mild  . Gastroparesis   . GERD (gastroesophageal reflux disease)   . Headache   . Hyperglycemia   . Nephrolithiasis   . Renal cyst   . Stricture and stenosis of esophagus   . Stroke ([place]) 07-2015  . Vision abnormalities     Past Surgical History:  Procedure Laterality Date  . [place] SURGERY  2012  . COLONOSCOPY    . [place] SURGERY  2005, 2010   replaced L4 and L5  . UPPER GASTROINTESTINAL ENDOSCOPY      There were no vitals filed for this visit.  Subjective Assessment - 05/17/18 1023    Pertinent History  R BG CVA.  Pt has h/o of chronic low back pain prior to stroke    Patient Stated Goals  To walk better and get stronger    Currently in Pain?  Yes    Pain Score  4     Pain Location  Back    Pain Orientation  Lower    Pain Descriptors / Indicators  Aching    Pain Type  Chronic pain    Pain Onset  More than a month ago    Pain Frequency  Constant    Aggravating Factors   standing too long    Pain  Relieving Factors  rest             Treatment: weightbearing through bilateral UE's on table top while rocking forwards and backwards, min v.c and demo Functional overhead reaching with LUE to retrieve items from top shelf then copying small peg design on vertical surface for increased LUE coordination and control with LUE, min-mod difficulty/ v.c,  removing with in hand manipulation. (pt knocked over the container of small pegs because of ataxia, and this was very frustrating for him . Therapist provided reassurance.) Seated shoulder flexion and diagonals with bilateral UE's and medium ball, min v.c for positioning. Simulating carrying a plate of food with LUE, while ambulating min v.c to keep elbow closer to body, however pt was able to extend plate away with good control. Pt was reassured at end of session that he appears to be progressing well as pt expresses concerns regarding LUE functional use.                OT Short Term Goals -  05/16/18 0951      OT SHORT TERM GOAL #1   Title  Pt will be mod I with HEP for coordination for LUE, balance -goals due  05/23/2018    Status  On-going      OT SHORT TERM GOAL #2   Title  Pt will be mod I with tying shoes    Status  Achieved      OT SHORT TERM GOAL #3   Title  Pt will demonstrate improved coordination as evidenced by decreasing time on 9 hole peg with LUE by at least 7 seven seconds to assist with functional fine motor tasks.     Status  On-going      OT SHORT TERM GOAL #4   Title  Pt will be mod I with shaving    Status  Achieved      OT SHORT TERM GOAL #5   Title  Pt will be supervision with shower transfers    Status  Achieved      OT SHORT TERM GOAL #6   Title  Pt will be mod I with simple snack/cold meal prep     Status  On-going      OT SHORT TERM GOAL #7   Title  Pt will be mod I with loading dishwasher/washing dishes    Status  On-going        OT Long Term Goals - 05/16/18 0951      OT LONG  TERM GOAL #1   Title  Pt will be mod I with upgraded HEP - goals due 06/20/2018    Status  On-going      OT LONG TERM GOAL #2   Title  Pt will be mod I with shower transfers    Status  On-going      OT LONG TERM GOAL #3   Title  Pt will be mod I with hot meal prep with at least two items.    Status  On-going      OT LONG TERM GOAL #4   Title  Pt demonstrate improved coordination as evidenced by decreasing time on 9 hole peg by at least 10 seconds with LUE.     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     Baseline  baseline = 29    Status  On-going      OT LONG TERM GOAL #7   Title  Pt will return to water fitness class at [facility] #8   Title  Pt will be mod I with familiar home mgmt tasks    Status  On-going            Plan - 05/17/18 1046    Clinical Impression Statement  Pt is progressing towards goals with improving LUE control and functional use.    Occupational Profile and client history currently impacting functional performance  PMH:  L BG CVA, chronic low back pain, HLD, Barrents  esophagus, dysphagia, HTN, sleep apnea,     Occupational performance deficits (Please refer to evaluation for details):  ADL's;IADL's;Rest and Sleep;Leisure;Social Participation    Rehab Potential  Good    OT Frequency  2x / week    OT Duration  8 weeks    OT Treatment/Interventions  Self-care/ADL training;Aquatic Therapy;Moist Heat;Electrical Stimulation;Fluidtherapy;DME and/or AE instruction;Neuromuscular education;Therapeutic exercise;Functional Mobility Training;Manual Therapy;Passive range of  motion;Therapeutic activities;Patient/family education;Balance training    Plan   NMR for balance, motor planning , functional use of LUE,     Consulted and Agree with Plan of Care  Patient;Family member/caregiver    Family Member Consulted  wife [name]       Patient will benefit from skilled therapeutic intervention in order to improve the following deficits and impairments:  Abnormal gait, Decreased activity tolerance, Decreased balance, Decreased coordination, Decreased mobility, Difficulty walking, Impaired UE functional use, Impaired sensation  Visit Diagnosis: Other disturbances of skin sensation  Unsteadiness on feet  Muscle weakness (generalized)  Other symptoms and signs involving the nervous system  Abnormal posture  Ataxia    Problem List Patient Active Problem List   Diagnosis Date Noted  . Chronic pain syndrome   . Chronic bilateral low back pain   . Benign essential HTN   . Tobacco abuse   . Marijuana abuse   . Hyperlipidemia   . History of CVA with residual deficit   . Dysphagia, post-stroke   . ICH (intracerebral hemorrhage) (HCC) - R thalmic/PLIC d/t HTN 38/08/1750  . Insomnia 07/13/2017  . PAD (peripheral artery disease) ([place]) 07/13/2017  . Hyperlipidemia with target LDL less than 70 07/13/2017  . Stroke-like symptom 09/22/2016  . Paresthesia 09/22/2016  . CVA (cerebral vascular accident) ([place]) 09/22/2016  . Cerebrovascular accident (CVA) due to thrombosis of left carotid artery ([place]) 09/23/2015  . Primary snoring 09/23/2015  . Hypersomnia with sleep apnea 09/23/2015  . Obstructive sleep apnea 09/23/2015  . Lacunar infarct, acute ([place]) 08/25/2015  . Sleep apnea 08/25/2015  . Dysarthria   . CVA (cerebral infarction) 08/02/2015  . Acute hyperglycemia 08/02/2015  . Systolic hypertension with cerebrovascular disease 12/27/2014  . Epistaxis 07/28/2012  . HYPERGLYCEMIA 10/02/2010  . NEPHROLITHIASIS 05/23/2009  . BARRETTS ESOPHAGUS 02/20/2009  .  Gastroparesis 02/20/2009  . RADICULOPATHY 10/21/2008  . GERD 10/08/2008  . ESOPHAGITIS 08/15/2008  . ESOPHAGEAL STRICTURE 08/15/2008  . GASTRITIS, ACUTE 08/15/2008  . RENAL CYST 08/14/2008  . TOBACCO ABUSE 08/08/2008  . HEMATURIA, MICROSCOPIC, HX OF 08/08/2008  . PULMONARY NODULE 01/10/2008    Richard Vaughn 05/17/2018, 10:50 AM  [facility] [address] [place], [place], [address] Phone: [phone]   Fax:  [phone]  Name: Richard Vaughn MRN: [id number] Date of Birth: 1951/07/31

## 2018-05-17 NOTE — Therapy (Signed)
[facility] [address] [place], [place], [address] Phone: [phone]   Fax:  [phone]  Physical Therapy Treatment  Patient Details  Name: Richard Vaughn MRN: [id number] Date of Birth: 12-Mar-1951 Referring Provider: Dr. Erlinda Hong   Encounter Date: 05/17/2018  PT End of Session - 05/17/18 1841    Visit Number  5    Number of Visits  17    Date for PT Re-Evaluation  06/24/18    Authorization Type  Federal BCBS: 75 visits combines-awaiting to see if aquatic therapy is covered    PT Start Time  1105    PT Stop Time  1145    PT Time Calculation (min)  40 min    Equipment Utilized During Treatment  Gait belt    Activity Tolerance  Patient tolerated treatment well    Behavior During Therapy  WFL for tasks assessed/performed       Past Medical History:  Diagnosis Date  . Barrett's esophagus   . Chronic back pain   . Gastritis    Mild  . Gastroparesis   . GERD (gastroesophageal reflux disease)   . Headache   . Hyperglycemia   . Nephrolithiasis   . Renal cyst   . Stricture and stenosis of esophagus   . Stroke ([place]) 07-2015  . Vision abnormalities     Past Surgical History:  Procedure Laterality Date  . [place] SURGERY  2012  . COLONOSCOPY    . [place] SURGERY  2005, 2010   replaced L4 and L5  . UPPER GASTROINTESTINAL ENDOSCOPY      There were no vitals filed for this visit.  Subjective Assessment - 05/17/18 1100    Subjective  Pt reports doing well since last visit, no falls.  Some back pain.      Pertinent History  HTN, chronic back pain, HLD, visual abnormalities, sleep apnea, PAD, hx of L BG CVA    Patient Stated Goals  Walk without RW and get back to water aerobics at the [facility]    Pain Score  4     Pain Location  Back    Pain Onset  More than a month ago          [facility] Adult PT Treatment/Exercise - 05/17/18 0001      Ambulation/Gait   Ambulation/Gait  Yes    Ambulation/Gait  Assistance  5: Supervision    Ambulation Distance (Feet)  20 Feet    Assistive device  None    Gait Pattern  Step-through pattern    Ambulation Surface  Level;Indoor    Gait velocity  3.14 ft/sec      Standardized Balance Assessment   Standardized Balance Assessment  Timed Up and Go Test      Timed Up and Go Test   TUG  Normal TUG    Normal TUG (seconds)  14.2      High Level Balance   High Level Balance Activities  Braiding    High Level Balance Comments  pt requried min assist to perform ex correctly       Neuro Re-ed    Neuro Re-ed Details   on airex in corner for safety narrow bos EC head turns & nods 10 reps x 3 sets each with min gaurd assist and intermittent UE support for balance      Knee/Hip Exercises: Stretches   Active Hamstring Stretch  Both;3 reps;30 seconds supine    Active Hamstring Stretch Limitations  needed  VC's to keep knee straight      Knee/Hip Exercises: Standing   Other Standing Knee Exercises  Standing to half kneeling beside mat table for support. x 5 reps each      Other Standing Knee Exercises  foward marching with hovering hand over counter top for safety 20 reps x 2 sets      Knee/Hip Exercises: Sidelying   Hip ABduction  AROM;Both;10 reps Ex done with black tband; ex was too easy so  taken off HEP         PT Education - 05/16/18 1249    Education Details  updated HEP    Person(s) Educated  Patient;Spouse    Methods  Explanation;Demonstration;Handout    Comprehension  Verbalized understanding;Returned demonstration       PT Short Term Goals - 05/17/18 1135      PT SHORT TERM GOAL #1   Title  Pt will be IND in HEP to improve deficits listed above. TARGET DATE FOR ALL STGS: 05/23/18    Baseline  05/17/18; needed min to min gaurd assist with frequent vc to perfom exs correctly today    Status  Not Met      PT SHORT TERM GOAL #2   Title  Pt will improve gait speed to >/=1.8 ft/sec with LRAD to reduce falls risk.     Baseline  05/17/18; goal  met, 3.14 ft/sec with no AD    Status  Achieved      PT SHORT TERM GOAL #3   Title  Pt will amb. 300' with LRAD at MOD I level to improve functional mobility.     Status  On-going      PT SHORT TERM GOAL #4   Title  Pt will improve TUG time to </=20 sec. with LRAD to reduce falls risk.     Baseline  05/17/18; goal met 14.2 sec with no AD    Status  Achieved      PT SHORT TERM GOAL #5   Title  Pt will improve BERG score to >/=40/56 to decr. falls risk.     Status  On-going      PT SHORT TERM GOAL #6   Title  Pt will begin aquatic therapy to improve endurance, strength, and balance.     Baseline  05/17/18; goal met    Status  Achieved        PT Long Term Goals - 04/25/18 1355      PT LONG TERM GOAL #1   Title  Pt will verbalize understanding of CVA risk factors and s/s of CVA to reduce risk of another stroke. TARGET DATE FOR ALL LTGS: 06/20/18    Status  New      PT LONG TERM GOAL #2   Title  Pt will improve TUG time to </=13.5 sec. with LRAD to reduce falls risk.     Status  New      PT LONG TERM GOAL #3   Title  Pt will improve gait speed to >/=2.71f/sec. with LRAD to safely amb. in the community.     Status  New      PT LONG TERM GOAL #4   Title  Pt will improve BERG score to >/=45/56 to decr. falls risk.     Status  New      PT LONG TERM GOAL #5   Title  Pt will amb. 600' over even/uneven terrain with LRAD at MOD I level to improve functional mobility.  Status  New         Plan - 05/17/18 1218    Clinical Impression Statement  Today's skilled session focused on addressing STG's. Pt has made progress and had met some goals. Will check remaining STGs at next session. Pt has also stopped using all AD's. He appears to be steady and spouse stays close when he's outdoors. Pt is progressign well and would benefit from continued PT in order to meet unmet goals.    Rehab Potential  Good    Clinical Impairments Affecting Rehab Potential  see above    PT Frequency  2x /  week    PT Duration  8 weeks    PT Treatment/Interventions  ADLs/Self Care Home Management;Biofeedback;Canalith Repostioning;Electrical Stimulation;Therapeutic activities;Functional mobility training;Stair training;Gait training;Orthotic Fit/Training;Therapeutic exercise;Manual techniques;Vestibular;Patient/family education;DME Instruction;Neuromuscular re-education;Balance training aquatic therapy    PT Next Visit Plan  finish STG, work on gait without AD, hip and stepping strategy, L ankle stabilization, Add B shoulder retraction and cx retraction to HEP to improve posture.     Consulted and Agree with Plan of Care  Patient;Family member/caregiver    Family Member Consulted  wife            Patient will benefit from skilled therapeutic intervention in order to improve the following deficits and impairments:  Abnormal gait, Decreased endurance, Impaired sensation, Decreased strength, Decreased knowledge of use of DME, Impaired UE functional use, Decreased balance, Decreased mobility, Impaired flexibility, Postural dysfunction(PT will not directly treat back pain but will monitor closely)  Visit Diagnosis: Other disturbances of skin sensation  Unsteadiness on feet  Other abnormalities of gait and mobility  Muscle weakness (generalized)     Problem List Patient Active Problem List   Diagnosis Date Noted  . Chronic pain syndrome   . Chronic bilateral low back pain   . Benign essential HTN   . Tobacco abuse   . Marijuana abuse   . Hyperlipidemia   . History of CVA with residual deficit   . Dysphagia, post-stroke   . ICH (intracerebral hemorrhage) (HCC) - R thalmic/PLIC d/t HTN 57/32/2025  . Insomnia 07/13/2017  . PAD (peripheral artery disease) ([place]) 07/13/2017  . Hyperlipidemia with target LDL less than 70 07/13/2017  . Stroke-like symptom 09/22/2016  . Paresthesia 09/22/2016  . CVA (cerebral vascular accident) ([place]) 09/22/2016  . 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 Primary snoring 09/23/2015  . Hypersomnia with sleep apnea 09/23/2015  . Obstructive sleep apnea 09/23/2015  . Lacunar infarct, acute ([place]) 08/25/2015  . Sleep apnea 08/25/2015  . Dysarthria   . CVA (cerebral infarction) 08/02/2015  . Acute hyperglycemia 08/02/2015  . Systolic hypertension with cerebrovascular disease 12/27/2014  . Epistaxis 07/28/2012  . HYPERGLYCEMIA 10/02/2010  . NEPHROLITHIASIS 05/23/2009  . BARRETTS ESOPHAGUS 02/20/2009  . Gastroparesis 02/20/2009  . RADICULOPATHY 10/21/2008  . GERD 10/08/2008  . ESOPHAGITIS 08/15/2008  . ESOPHAGEAL STRICTURE 08/15/2008  . GASTRITIS, ACUTE 08/15/2008  . RENAL CYST 08/14/2008  . TOBACCO ABUSE 08/08/2008  . HEMATURIA, MICROSCOPIC, HX OF 08/08/2008  . PULMONARY NODULE 01/10/2008   Halina Andreas, [place] 05/17/2018, 12:20 PM  [facility] [address] [place], [place], [address] Phone: [phone]   Fax:  [phone]  Name: Richard Vaughn MRN: [id number] Date of Birth: Jan 16, 1951  This note has been reviewed and edited by supervising CI. Added additional information to clinical impression statement, corrected STGs and added treatment visit section for today's  visit (not visit from 05/16/18).  Willow Ora, PTA, [facility] [address], [place], [place] [address] [phone] 05/17/18, 6:49 PM

## 2018-05-22 NOTE — Progress Notes (Signed)
I agree with the above plan 

## 2018-05-23 ENCOUNTER — Encounter: Payer: Federal, State, Local not specified - PPO | Admitting: Occupational Therapy

## 2018-05-23 ENCOUNTER — Ambulatory Visit: Payer: Federal, State, Local not specified - PPO

## 2018-05-24 ENCOUNTER — Ambulatory Visit: Payer: Federal, State, Local not specified - PPO | Admitting: Occupational Therapy

## 2018-05-24 ENCOUNTER — Encounter: Payer: Self-pay | Admitting: Occupational Therapy

## 2018-05-24 DIAGNOSIS — R270 Ataxia, unspecified: Secondary | ICD-10-CM | POA: Diagnosis not present

## 2018-05-24 DIAGNOSIS — R208 Other disturbances of skin sensation: Secondary | ICD-10-CM | POA: Diagnosis not present

## 2018-05-24 DIAGNOSIS — M6281 Muscle weakness (generalized): Secondary | ICD-10-CM

## 2018-05-24 DIAGNOSIS — R2689 Other abnormalities of gait and mobility: Secondary | ICD-10-CM | POA: Diagnosis not present

## 2018-05-24 DIAGNOSIS — R293 Abnormal posture: Secondary | ICD-10-CM

## 2018-05-24 DIAGNOSIS — R471 Dysarthria and anarthria: Secondary | ICD-10-CM | POA: Diagnosis not present

## 2018-05-24 DIAGNOSIS — R29818 Other symptoms and signs involving the nervous system: Secondary | ICD-10-CM

## 2018-05-24 DIAGNOSIS — R2681 Unsteadiness on feet: Secondary | ICD-10-CM | POA: Diagnosis not present

## 2018-05-24 NOTE — Therapy (Signed)
[facility] [address] [place], [place], [address] Phone: [phone]   Fax:  [phone]  Occupational Therapy Treatment  Patient Details  Name: Richard Vaughn MRN: [id number] Date of Birth: [birth year] Referring Provider: Dr. Erlinda Hong   Encounter Date: 05/24/2018  OT End of Session - 05/24/18 1836    Visit Number  7    Number of Visits  16    Date for OT Re-Evaluation  05/22/18    Authorization Type  BCBS - 75 visits for all disciplines. Counts as 1 visit if occurs on the same day. Pt approved for aquatic therapy    Authorization - Visit Number  10    Authorization - Number of Visits  86    OT Start Time  1700    OT Stop Time  1515    OT Time Calculation (min)  42 min    Activity Tolerance  Patient tolerated treatment well       Past Medical History:  Diagnosis Date  . Barrett's esophagus   . Chronic back pain   . Gastritis    Mild  . Gastroparesis   . GERD (gastroesophageal reflux disease)   . Headache   . Hyperglycemia   . Nephrolithiasis   . Renal cyst   . Stricture and stenosis of esophagus   . Stroke ([place]) 07-2015  . Vision abnormalities     Past Surgical History:  Procedure Laterality Date  . [place] SURGERY  2012  . COLONOSCOPY    . [place] SURGERY  2005, 2010   replaced L4 and L5  . UPPER GASTROINTESTINAL ENDOSCOPY      There were no vitals filed for this visit.  Subjective Assessment - 05/24/18 1833    Patient is accompained by:  Family member wife [name]    Pertinent History  R BG CVA.  Pt has h/o of chronic low back pain prior to stroke    Patient Stated Goals  To walk better and get stronger    Currently in Pain?  Yes    Pain Score  2     Pain Location  Back    Pain Orientation  Lower    Pain Descriptors / Indicators  Aching    Pain Type  Chronic pain    Pain Onset  More than a month ago    Pain Frequency  Constant    Aggravating Factors   standing too long    Pain  Relieving Factors  resting    Multiple Pain Sites  No       Treatment:  Pt see for aquatic therapy today. Pt entered the pool via stairs using bilateral hand rails.  Therapy performed in water varying between 30ft to 5 feet depending upon activity.  Session focused on the following:  Addressed LE strengthening, gross coordination and core strength via functional ambulation forward and backward from more shallow to deeper water, starting with slow pace and then increasing speed of movement to increase resistance via hydrostatic pressure and buoyancy.  Progressed to holding kickboard perpendicular in UE's with functional ambulation to create  Increased resistance and drag and to include strengthening of core and UE's as well.  Also addressed LE strengthening with pt standing facing pool end in deeper water and completing LE abduction, back kicks (12 reps x2.  Addressed marching in place with no UE support to challenge balance, LE strengthening and core strength.  Addressed UE strengthening using foam dumb bells (blue for increased resistance  since last session) for chest presses, horizontal ab an adduction, scapular/shoulder depression (12 reps x2).  Addressed balance working in tandem to elicit hip reactions and core strength as well as front and back cross overs in sidestepping using noodle for light BUE support.  Initially pt required moderate assistance however with practice pt able to progress from deeper pool (more stability) to more shallow water (Increased demand) with close supervision.  Addressed balance via completing repetitive step over step up two steps and backwards two steps with no UE support initially with moderate assistance.  With practice and repetition, pt able to eventually exit the pool completing step over step, no UE support and close supervision.  Pt did have one episode of decreased O2 sats again this session, decreasing to 86% however with pursed lip breathing able to return to 99%. Pt  started session with 2/10 back pain and ended session with 0/10 back pain. Pt tolerated well and able to engage in more challenging activities today.                       OT Short Term Goals - 05/24/18 1834      OT SHORT TERM GOAL #1   Title  Pt will be mod I with HEP for coordination for LUE, balance -goals due  05/23/2018    Status  Achieved      OT SHORT TERM GOAL #2   Title  Pt will be mod I with tying shoes    Status  Achieved      OT SHORT TERM GOAL #3   Title  Pt will demonstrate improved coordination as evidenced by decreasing time on 9 hole peg with LUE by at least 7 seven seconds to assist with functional fine motor tasks.     Status  On-going      OT SHORT TERM GOAL #4   Title  Pt will be mod I with shaving    Status  Achieved      OT SHORT TERM GOAL #5   Title  Pt will be supervision with shower transfers    Status  Achieved      OT SHORT TERM GOAL #6   Title  Pt will be mod I with simple snack/cold meal prep     Status  Achieved      OT SHORT TERM GOAL #7   Title  Pt will be mod I with loading dishwasher/washing dishes    Status  Achieved        OT Long Term Goals - 05/24/18 1834      OT LONG TERM GOAL #1   Title  Pt will be mod I with upgraded HEP - goals due 06/20/2018    Status  On-going      OT LONG TERM GOAL #2   Title  Pt will be mod I with shower transfers    Status  On-going      OT LONG TERM GOAL #3   Title  Pt will be mod I with hot meal prep with at least two items.    Status  On-going      OT LONG TERM GOAL #4   Title  Pt demonstrate improved coordination as evidenced by decreasing time on 9 hole peg by at least 10 seconds with LUE.     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     Baseline  baseline = 29    Status  On-going      OT LONG TERM GOAL #7   Title  Pt will return to water fitness class at [facility] [place] #8   Title  Pt will be mod I with familiar home mgmt tasks    Status  On-going            Plan - 05/24/18 1834    Clinical Impression Statement  Pt continue to progress toward goals - pt demonstrates improved balance and functional ambulation    Occupational Profile and client history currently impacting functional performance  PMH:  L BG CVA, chronic low back pain, HLD, Barrents esophagus, dysphagia, HTN, sleep apnea,     Occupational performance deficits (Please refer to evaluation for details):  ADL's;IADL's;Rest and Sleep;Leisure;Social Participation    Rehab Potential  Good    Current Impairments/barriers affecting progress:  will monitor cognition    OT Frequency  2x / week    OT Duration  8 weeks    OT Treatment/Interventions  Self-care/ADL training;Aquatic Therapy;Moist Heat;Electrical Stimulation;Fluidtherapy;DME and/or AE instruction;Neuromuscular education;Therapeutic exercise;Functional Mobility Training;Manual Therapy;Passive range of motion;Therapeutic activities;Patient/family education;Balance training    Plan   NMR for balance, motor planning , functional use of LUE, functional ambulation, transitional movements    Consulted and Agree with Plan of Care  Patient;Family member/caregiver    Family Member Consulted  wife [name]       Patient will benefit from skilled therapeutic intervention in order to improve the following deficits and impairments:  Abnormal gait, Decreased activity tolerance, Decreased balance, Decreased coordination, Decreased mobility, Difficulty walking, Impaired UE functional use, Impaired sensation  Visit Diagnosis: Other disturbances of skin  sensation  Unsteadiness on feet  Muscle weakness (generalized)  Other symptoms and signs involving the nervous system  Abnormal posture  Ataxia    Problem List Patient Active Problem List   Diagnosis Date Noted  . Chronic pain syndrome   . Chronic bilateral low back pain   . Benign essential HTN   . Tobacco abuse   . Marijuana abuse   . Hyperlipidemia   . History of CVA with residual deficit   . Dysphagia, post-stroke   . ICH (intracerebral hemorrhage) (HCC) - R thalmic/PLIC d/t HTN 18/29/9371  . Insomnia 07/13/2017  . PAD (peripheral artery disease) ([place]) 07/13/2017  . Hyperlipidemia with target LDL less than 70 07/13/2017  . Stroke-like symptom 09/22/2016  . Paresthesia 09/22/2016  . CVA (cerebral vascular accident) ([place]) 09/22/2016  . Cerebrovascular accident (CVA) due to thrombosis of left carotid artery ([place]) 09/23/2015  . Primary snoring 09/23/2015  . Hypersomnia with sleep apnea 09/23/2015  . Obstructive sleep apnea 09/23/2015  . Lacunar infarct, acute ([place]) 08/25/2015  . Sleep apnea 08/25/2015  . Dysarthria   . CVA (cerebral infarction) 08/02/2015  . Acute hyperglycemia 08/02/2015  . Systolic hypertension with cerebrovascular disease 12/27/2014  . Epistaxis 07/28/2012  . HYPERGLYCEMIA 10/02/2010  . NEPHROLITHIASIS 05/23/2009  . BARRETTS ESOPHAGUS 02/20/2009  . Gastroparesis 02/20/2009  .  RADICULOPATHY 10/21/2008  . GERD 10/08/2008  . ESOPHAGITIS 08/15/2008  . ESOPHAGEAL STRICTURE 08/15/2008  . GASTRITIS, ACUTE 08/15/2008  . RENAL CYST 08/14/2008  . TOBACCO ABUSE 08/08/2008  . HEMATURIA, MICROSCOPIC, HX OF 08/08/2008  . PULMONARY NODULE 01/10/2008    Quay Burow, OTR/L 05/24/2018, 7:17 PM  [facility] [address] [place], [place], [address] Phone: [phone]   Fax:  [phone]  Name: DAEL HOWLAND MRN: [id number] Date of Birth: 1950-12-23

## 2018-05-25 ENCOUNTER — Encounter: Payer: Federal, State, Local not specified - PPO | Admitting: Occupational Therapy

## 2018-05-26 ENCOUNTER — Ambulatory Visit: Payer: Federal, State, Local not specified - PPO | Admitting: Speech Pathology

## 2018-05-26 ENCOUNTER — Encounter: Payer: Self-pay | Admitting: Physical Therapy

## 2018-05-26 ENCOUNTER — Ambulatory Visit: Payer: Federal, State, Local not specified - PPO | Admitting: Physical Therapy

## 2018-05-26 DIAGNOSIS — R2681 Unsteadiness on feet: Secondary | ICD-10-CM

## 2018-05-26 DIAGNOSIS — R208 Other disturbances of skin sensation: Secondary | ICD-10-CM | POA: Diagnosis not present

## 2018-05-26 DIAGNOSIS — M6281 Muscle weakness (generalized): Secondary | ICD-10-CM

## 2018-05-26 DIAGNOSIS — R2689 Other abnormalities of gait and mobility: Secondary | ICD-10-CM | POA: Diagnosis not present

## 2018-05-26 DIAGNOSIS — R293 Abnormal posture: Secondary | ICD-10-CM | POA: Diagnosis not present

## 2018-05-26 DIAGNOSIS — R29818 Other symptoms and signs involving the nervous system: Secondary | ICD-10-CM | POA: Diagnosis not present

## 2018-05-26 DIAGNOSIS — R471 Dysarthria and anarthria: Secondary | ICD-10-CM

## 2018-05-26 DIAGNOSIS — R270 Ataxia, unspecified: Secondary | ICD-10-CM | POA: Diagnosis not present

## 2018-05-26 NOTE — Therapy (Signed)
[facility] [address] [place], [place], [address] Phone: [phone]   Fax:  [phone]  Physical Therapy Treatment  Patient Details  Name: Richard Vaughn MRN: [id number] Date of Birth: [birth year] Referring Provider: Dr. Erlinda Hong   Encounter Date: 05/26/2018  PT End of Session - 05/26/18 0936    Visit Number  6    Number of Visits  17    Date for PT Re-Evaluation  06/24/18    Authorization Type  Federal [employer]: 75 visits combines-awaiting to see if aquatic therapy is covered    PT Start Time  0932    PT Stop Time  1015    PT Time Calculation (min)  43 min    Equipment Utilized During Treatment  Gait belt    Activity Tolerance  Patient tolerated treatment well    Behavior During Therapy  [facility] [place] for tasks assessed/performed       Past Medical History:  Diagnosis Date  . Barrett's esophagus   . Chronic back pain   . Gastritis    Mild  . Gastroparesis   . GERD (gastroesophageal reflux disease)   . Headache   . Hyperglycemia   . Nephrolithiasis   . Renal cyst   . Stricture and stenosis of esophagus   . Stroke ([place]) 07-2015  . Vision abnormalities     Past Surgical History:  Procedure Laterality Date  . [place] SURGERY  2012  . COLONOSCOPY    . [place] SURGERY  2005, 2010   replaced L4 and L5  . UPPER GASTROINTESTINAL ENDOSCOPY      There were no vitals filed for this visit.  Subjective Assessment - 05/26/18 0935    Subjective  Pt reports doing well since last visit, no falls.  Some back pain.      Pertinent History  HTN, chronic back pain, HLD, visual abnormalities, sleep apnea, PAD, hx of L BG CVA    Patient Stated Goals  Walk without RW and get back to water aerobics at the [facility]    Currently in Pain?  Yes    Pain Score  3     Pain Location  Back    Pain Orientation  Lower    Pain Descriptors / Indicators  Aching    Pain Type  Chronic pain    Pain Onset  More than a month ago    Aggravating Factors   Activity    Pain Relieving Factors  rest    Multiple Pain Sites  No         [facility] PT Assessment - 05/26/18 0951      Berg Balance Test   Sit to Stand  Able to stand without using hands and stabilize independently    Standing Unsupported  Able to stand safely 2 minutes    Sitting with Back Unsupported but Feet Supported on Floor or Stool  Able to sit safely and securely 2 minutes    Stand to Sit  Sits safely with minimal use of hands    Transfers  Able to transfer safely, minor use of hands    Standing Unsupported with Eyes Closed  Able to stand 10 seconds safely    Standing Ubsupported with Feet Together  Able to place feet together independently and stand 1 minute safely    From Standing, Reach Forward with Outstretched Arm  Can reach forward >12 cm safely (5")    From Standing Position, Pick up Object from Floor  Able to  pick up shoe safely and easily    From Standing Position, Turn to Look Behind Over each Shoulder  Looks behind from both sides and weight shifts well    Turn 360 Degrees  Able to turn 360 degrees safely in 4 seconds or less    Standing Unsupported, Alternately Place Feet on Step/Stool  Able to stand independently and safely and complete 8 steps in 20 seconds    Standing Unsupported, One Foot in Front  Able to plae foot ahead of the other independently and hold 30 seconds    Standing on One Leg  Able to lift leg independently and hold 5-10 seconds    Total Score  53        [facility] Adult PT Treatment/Exercise - 05/26/18 1106      Ambulation/Gait   Ambulation/Gait  Yes    Ambulation/Gait Assistance  7: Independent    Ambulation/Gait Assistance Details  Pt. ambulated with pool noodle behind back for tactile cueing of proper posture for 3 laps (approx. 345 ft.). After noodle was removed, pt. was able to maintaing posture with minimal cueing from SPTA while completing cognitive tasks such as describing a vacation, recipes and tossing a ball  simultaneously for 3 additional laps (approx. 345 ft.).  For 2 final laps (approx. 230 ft.) pt. was asked to turn his head from side <> side and up <> down, this required supervision to min guard due to slight staggering in turns, though he was able to recover his balance quickly and safely because he knew they were coming.       Posture/Postural Control   Posture/Postural Control  Postural limitations    Postural Limitations  Forward head;Rounded Shoulders    Posture Comments  Cervical retraction into a pillow 2 sets, 10 reps to address his forward head and reported occasional neck pain.         PT Short Term Goals - 05/26/18 1056      PT SHORT TERM GOAL #1   Title  Pt will be IND in HEP to improve deficits listed above. TARGET DATE FOR ALL STGS: 05/23/18    Baseline  05/17/18; needed min to min gaurd assist with frequent vc to perfom exs correctly today    Status  Not Met      PT SHORT TERM GOAL #2   Title  Pt will improve gait speed to >/=1.8 ft/sec with LRAD to reduce falls risk.     Baseline  05/17/18; goal met, 3.14 ft/sec with no AD    Status  Achieved      PT SHORT TERM GOAL #3   Title  Pt will amb. 300' with LRAD at MOD I level to improve functional mobility.     Baseline  05/26/2018 Met today. Pt. is indepedent with gait without an AD.    Status  Achieved      PT SHORT TERM GOAL #4   Title  Pt will improve TUG time to </=20 sec. with LRAD to reduce falls risk.     Baseline  05/17/18; goal met 14.2 sec with no AD    Status  Achieved      PT SHORT TERM GOAL #5   Title  Pt will improve BERG score to >/=40/56 to decr. falls risk.     Baseline  05/26/2018 Met today 53/56.     Status  Achieved      PT SHORT TERM GOAL #6   Title  Pt will begin aquatic therapy to improve endurance,  strength, and balance.     Baseline  05/17/18; goal met    Status  Achieved        PT Long Term Goals - 05/26/18 1058      PT LONG TERM GOAL #1   Title  Pt will verbalize understanding of CVA  risk factors and s/s of CVA to reduce risk of another stroke. TARGET DATE FOR ALL LTGS: 06/20/18    Status  On-going      PT LONG TERM GOAL #2   Title  Pt will improve TUG time to </=13.5 sec. with LRAD to reduce falls risk.     Status  On-going      PT LONG TERM GOAL #3   Title  Pt will improve gait speed to >/=2.50f/sec. with LRAD to safely amb. in the community.     Status  On-going      PT LONG TERM GOAL #4   Title  Pt will improve BERG score to >/=45/56 to decr. falls risk.     Baseline  05/26/2018 Met today 53/56.    Status  Achieved      PT LONG TERM GOAL #5   Title  Pt will amb. 600' over even/uneven terrain with LRAD at MOD I level to improve functional mobility.     Status  On-going        Plan - 05/26/18 1100    Clinical Impression Statement  Today's session focused on checking remaining STG's and on dynamic gait with cognitive tasks. Pt. achieved both short term and long term BERG goal and is progressing steadily toward all other LTG's. He would benefit from continued PT to reach remaining unmet goals.     Rehab Potential  Good    Clinical Impairments Affecting Rehab Potential  see above    PT Frequency  2x / week    PT Duration  8 weeks    PT Treatment/Interventions  ADLs/Self Care Home Management;Biofeedback;Canalith Repostioning;Electrical Stimulation;Therapeutic activities;Functional mobility training;Stair training;Gait training;Orthotic Fit/Training;Therapeutic exercise;Manual techniques;Vestibular;Patient/family education;DME Instruction;Neuromuscular re-education;Balance training aquatic therapy    PT Next Visit Plan  work on gait without AD, hip and stepping strategy, L ankle stabilization, balance/gait on compliant surfaces    Consulted and Agree with Plan of Care  Patient;Family member/caregiver    Family Member Consulted  wife       Patient will benefit from skilled therapeutic intervention in order to improve the following deficits and impairments:  Abnormal  gait, Decreased endurance, Impaired sensation, Decreased strength, Decreased knowledge of use of DME, Impaired UE functional use, Decreased balance, Decreased mobility, Impaired flexibility, Postural dysfunction(PT will not directly treat back pain but will monitor closely)  Visit Diagnosis: Unsteadiness on feet  Muscle weakness (generalized)  Other abnormalities of gait and mobility     Problem List Patient Active Problem List   Diagnosis Date Noted  . Chronic pain syndrome   . Chronic bilateral low back pain   . Benign essential HTN   . Tobacco abuse   . Marijuana abuse   . Hyperlipidemia   . History of CVA with residual deficit   . Dysphagia, post-stroke   . ICH (intracerebral hemorrhage) (HCC) - R thalmic/PLIC d/t HTN 007/62/2633 . Insomnia 07/13/2017  . P[place] disease) [place] 07/13[place]  . Hyperlipidemia with target LDL less than 70 07/13/2017  . Stroke-like symptom 09/22/2016  . Paresthesia 09/22/2016  . [place]re[place] vascular [place]) [place] 09/22/2016  . Cerebrovascular [place] (CVA) due to [place] of left carotid artery [place] 09/23/2015  .  Primary snoring 09/23/2015  . Hypersomnia with sleep apnea 09/23/2015  . Obstructive sleep apnea 09/23/2015  . Lacunar infarct, acute ([place]) 08/25/2015  . Sleep apnea 08/25/2015  . Dysarthria   . CVA (cerebral infarction) 08/02/2015  . Acute hyperglycemia 08/02/2015  . Systolic hypertension with cerebrovascular disease 12/27/2014  . Epistaxis 07/28/2012  . HYPERGLYCEMIA 10/02/2010  . NEPHROLITHIASIS 05/23/2009  . BARRETTS ESOPHAGUS 02/20/2009  . Gastroparesis 02/20/2009  . RADICULOPATHY 10/21/2008  . GERD 10/08/2008  . ESOPHAGITIS 08/15/2008  . ESOPHAGEAL STRICTURE 08/15/2008  . GASTRITIS, ACUTE 08/15/2008  . RENAL CYST 08/14/2008  . TOBACCO ABUSE 08/08/2008  . HEMATURIA, MICROSCOPIC, HX OF 08/08/2008  . PULMONARY NODULE 01/10/2008    Arthor Captain, SPTA 05/26/2018, 11:14 AM  [facility] [address] [place], [place], [address] Phone: [phone]   Fax:  [phone]  Name: Richard Vaughn MRN: [id number] Date of Birth: 1951/02/28

## 2018-05-26 NOTE — Therapy (Signed)
[facility] [address] [place], [place], [address] Phone: [phone]   Fax:  [phone]  Speech Language Pathology Treatment  Patient Details  Name: Richard Vaughn MRN: [id number] Date of Birth: Nov 12, 1951 Referring Provider: Rosalin Hawking, MD   Encounter Date: 05/26/2018  End of Session - 05/26/18 1156    Visit Number  3    Number of Visits  17    Date for SLP Re-Evaluation  08/04/18    SLP Start Time  0846    SLP Stop Time   0928    SLP Time Calculation (min)  42 min    Activity Tolerance  Patient tolerated treatment well       Past Medical History:  Diagnosis Date  . Barrett's esophagus   . Chronic back pain   . Gastritis    Mild  . Gastroparesis   . GERD (gastroesophageal reflux disease)   . Headache   . Hyperglycemia   . Nephrolithiasis   . Renal cyst   . Stricture and stenosis of esophagus   . Stroke ([place]) 07-2015  . Vision abnormalities     Past Surgical History:  Procedure Laterality Date  . [place] SURGERY  2012  . COLONOSCOPY    . [place] SURGERY  2005, 2010   replaced L4 and L5  . UPPER GASTROINTESTINAL ENDOSCOPY      There were no vitals filed for this visit.  Subjective Assessment - 05/26/18 0851    Subjective  "To be honest, sometimes. Not as much as I should."    Currently in Pain?  Yes    Pain Score  3     Pain Location  Back    Pain Orientation  Lower    Pain Descriptors / Indicators  Aching    Pain Type  Chronic pain    Pain Onset  More than a month ago            ADULT SLP TREATMENT - 05/26/18 0846      General Information   Behavior/Cognition  Alert;Cooperative      Treatment Provided   Treatment provided  Cognitive-Linquistic      Cognitive-Linquistic Treatment   Treatment focused on  Dysarthria    Skilled Treatment  Pt reporting doing HEP "maybe once a day." SLP told pt to complete 3x per day. Demo'd HEP for dysarthria with occasional mod A for  increased ROM. Pt reported having difficulty with abdominal breathing. In sitting position, pt unable to achieve AB with accuracy despite tactile cues to reduce shoulder movement, visual feedback with mirror, or tandem breathing. SLP used PT mat to increase awareness of abdominal movement in supine position. Afterwards, pt with 80% success with AB at rest in upright position. Mod-max A for coordination with automatic speech tasks, fading to occasional min A. Pt instructed to practice AB at home along with HEP, 3 x per day.      Assessment / Recommendations / Plan   Plan  Continue with current plan of care      Progression Toward Goals   Progression toward goals  Progressing toward goals       SLP Education - 05/26/18 1155    Education Details  abdominal breathing, complete HEP 3x per day    Person(s) Educated  Patient;Spouse    Methods  Explanation;Demonstration;Verbal cues;Tactile cues    Comprehension  Verbalized understanding;Returned demonstration;Need further instruction       SLP Short Term Goals - 05/26/18 1158  SLP SHORT TERM GOAL #1   Title  pt will complete HEP with occasional min A     Time  3    Period  Weeks    Status  On-going      SLP SHORT TERM GOAL #2   Title  pt will demo compensations for dysarthria in mod complex sentence responses 90%     Time  3    Period  Weeks    Status  On-going      SLP SHORT TERM GOAL #3   Title  pt will produce 100% intelligible speech in 8 minutes simple conversation with modified independence (compensations)    Time  3    Period  Weeks    Status  On-going       SLP Long Term Goals - 05/26/18 1158      SLP LONG TERM GOAL #1   Title  pt will complete HEP for dysarthria with rare min A over three consecutive sessions    Time  7    Period  Weeks    Status  On-going      SLP LONG TERM GOAL #2   Title  pt will produce 10 minutes intelligible mod complex conversation using compensations, with rare min A over 3 consecutive  sessions    Time  7    Period  Weeks    Status  On-going      SLP LONG TERM GOAL #3   Title  pt will demo speech volume in 10 minutes simple-mod complex conversation, in low 70s dB average over three consecutive sessions    Time  7    Period  Weeks    Status  On-going       Plan - 05/26/18 1156    Clinical Impression Statement  Continued training in HEP for dysarthria, abdominal breathing and use of power voice in words/short common phrases to improve voice quality. Voice quality returned to hoarse, strained when pt focused on HEP with over articulation. Conversational speech remains strained and hoarse. Continue skilled ST to maximize intelligiblity across settings.     Speech Therapy Frequency  2x / week    Treatment/Interventions  Oral motor exercises;SLP instruction and feedback;Compensatory strategies;Patient/family education;Environmental controls;Cognitive reorganization;Functional tasks;Cueing hierarchy;Internal/external aids    Potential to Achieve Goals  Good    Consulted and Agree with Plan of Care  Patient       Patient will benefit from skilled therapeutic intervention in order to improve the following deficits and impairments:   Dysarthria and anarthria    Problem List Patient Active Problem List   Diagnosis Date Noted  . Chronic pain syndrome   . Chronic bilateral low back pain   . Benign essential HTN   . Tobacco abuse   . Marijuana abuse   . Hyperlipidemia   . History of CVA with residual deficit   . Dysphagia, post-stroke   . ICH (intracerebral hemorrhage) (HCC) - R thalmic/PLIC d/t HTN 40/98/1191  . Insomnia 07/13/2017  . PAD (peripheral artery disease) ([place]) 07/13/2017  . Hyperlipidemia with target LDL less than 70 07/13/2017  . Stroke-like symptom 09/22/2016  . Paresthesia 09/22/2016  . CVA (cerebral vascular accident) ([place]) 09/22/2016  . Cerebrovascular accident (CVA) due to thrombosis of left carotid artery ([place]) 09/23/2015  . Primary snoring  09/23/2015  . Hypersomnia with sleep apnea 09/23/2015  . Obstructive sleep apnea 09/23/2015  . Lacunar infarct, acute ([place]) 08/25/2015  . Sleep apnea 08/25/2015  . Dysarthria   . CVA (cerebral  infarction) 08/02/2015  . Acute hyperglycemia 08/02/2015  . Systolic hypertension with cerebrovascular disease 12/27/2014  . Epistaxis 07/28/2012  . HYPERGLYCEMIA 10/02/2010  . NEPHROLITHIASIS 05/23/2009  . BARRETTS ESOPHAGUS 02/20/2009  . Gastroparesis 02/20/2009  . RADICULOPATHY 10/21/2008  . GERD 10/08/2008  . ESOPHAGITIS 08/15/2008  . ESOPHAGEAL STRICTURE 08/15/2008  . GASTRITIS, ACUTE 08/15/2008  . RENAL CYST 08/14/2008  . TOBACCO ABUSE 08/08/2008  . HEMATURIA, MICROSCOPIC, HX OF 08/08/2008  . PULMONARY NODULE 01/10/2008   Deneise Lever, [place], [place] Speech-Language Pathologist  Aliene Altes 05/26/2018, 11:59 AM  [facility] [address] [place], [place], [address] Phone: [phone]   Fax:  [phone]   Name: Richard Vaughn MRN: [id number] Date of Birth: 11/30/1950

## 2018-05-27 ENCOUNTER — Other Ambulatory Visit: Payer: Federal, State, Local not specified - PPO

## 2018-05-29 ENCOUNTER — Encounter: Payer: Federal, State, Local not specified - PPO | Admitting: Occupational Therapy

## 2018-05-29 ENCOUNTER — Ambulatory Visit: Payer: Federal, State, Local not specified - PPO | Admitting: Rehabilitation

## 2018-05-30 ENCOUNTER — Encounter: Payer: Self-pay | Admitting: Speech Pathology

## 2018-05-30 ENCOUNTER — Ambulatory Visit: Payer: Federal, State, Local not specified - PPO | Admitting: Speech Pathology

## 2018-05-30 ENCOUNTER — Ambulatory Visit: Payer: Federal, State, Local not specified - PPO | Admitting: Occupational Therapy

## 2018-05-30 ENCOUNTER — Encounter: Payer: Self-pay | Admitting: Occupational Therapy

## 2018-05-30 ENCOUNTER — Ambulatory Visit: Payer: Federal, State, Local not specified - PPO

## 2018-05-30 VITALS — BP 144/98 | HR 67

## 2018-05-30 DIAGNOSIS — R29818 Other symptoms and signs involving the nervous system: Secondary | ICD-10-CM | POA: Diagnosis not present

## 2018-05-30 DIAGNOSIS — R270 Ataxia, unspecified: Secondary | ICD-10-CM | POA: Diagnosis not present

## 2018-05-30 DIAGNOSIS — R2681 Unsteadiness on feet: Secondary | ICD-10-CM | POA: Diagnosis not present

## 2018-05-30 DIAGNOSIS — R208 Other disturbances of skin sensation: Secondary | ICD-10-CM

## 2018-05-30 DIAGNOSIS — M6281 Muscle weakness (generalized): Secondary | ICD-10-CM | POA: Diagnosis not present

## 2018-05-30 DIAGNOSIS — R293 Abnormal posture: Secondary | ICD-10-CM | POA: Diagnosis not present

## 2018-05-30 DIAGNOSIS — R2689 Other abnormalities of gait and mobility: Secondary | ICD-10-CM

## 2018-05-30 DIAGNOSIS — R471 Dysarthria and anarthria: Secondary | ICD-10-CM

## 2018-05-30 NOTE — Therapy (Signed)
[facility] [address] [place], [place], [address] Phone: [phone]   Fax:  [phone]  Physical Therapy Treatment  Patient Details  Name: Richard Vaughn MRN: [id number] Date of Birth: Dec 30, 1950 Referring Provider: Dr. Erlinda Hong   Encounter Date: 05/30/2018  PT End of Session - 05/30/18 1008    Visit Number  7 PT visits    Number of Visits  17    Date for PT Re-Evaluation  06/24/18    Authorization Type  Federal BCBS: 75 visits combines-awaiting to see if aquatic therapy is covered    Authorization - Visit Number  12 total visits    Authorization - Number of Visits  36    PT Start Time  0807 pt late    PT Stop Time  0846    PT Time Calculation (min)  39 min    Equipment Utilized During Treatment  Gait belt    Activity Tolerance  Patient tolerated treatment well    Behavior During Therapy  WFL for tasks assessed/performed       Past Medical History:  Diagnosis Date  . Barrett's esophagus   . Chronic back pain   . Gastritis    Mild  . Gastroparesis   . GERD (gastroesophageal reflux disease)   . Headache   . Hyperglycemia   . Nephrolithiasis   . Renal cyst   . Stricture and stenosis of esophagus   . Stroke ([place]) 07-2015  . Vision abnormalities     Past Surgical History:  Procedure Laterality Date  . [place] SURGERY  2012  . COLONOSCOPY    . [place] SURGERY  2005, 2010   replaced L4 and L5  . UPPER GASTROINTESTINAL ENDOSCOPY      Vitals:   05/30/18 0812 05/30/18 0822  BP: (!) 146/97 (!) 144/98  Pulse: 61 67    Subjective Assessment - 05/30/18 0812    Subjective  Pt denied falls or changes since last visit.     Pertinent History  HTN, chronic back pain, HLD, visual abnormalities, sleep apnea, PAD, hx of L BG CVA    Patient Stated Goals  Walk without RW and get back to water aerobics at the [facility]    Currently in Pain?  Yes    Pain Score  -- 2-3/10    Pain Location  Back    Pain  Orientation  Lower    Pain Descriptors / Indicators  Aching    Pain Type  Chronic pain    Pain Onset  More than a month ago    Pain Frequency  Constant    Aggravating Factors   movement    Pain Relieving Factors  rest and meds                       [facility] Adult PT Treatment/Exercise - 05/30/18 0813      Ambulation/Gait   Ambulation/Gait  Yes    Ambulation/Gait Assistance  5: Supervision    Ambulation/Gait Assistance Details  Cues to improve recripocal arm swing and heel strike, stride length.    Ambulation Distance (Feet)  500 Feet    Assistive device  None    Gait Pattern  Step-through pattern;Decreased stride length;Decreased dorsiflexion - left;Decreased dorsiflexion - right;Decreased arm swing - right;Decreased arm swing - left    Ambulation Surface  Level;Unlevel;Indoor;Outdoor;Paved      High Level Balance   High Level Balance Activities  Side stepping;Backward walking;Head turns  High Level Balance Comments  Performed in // bars on red and blue mats: pt performed each activity 4x15'. Incr. postural sway and decr. stride length noted during head turns/nods. Cues and demo for technique.           Balance Exercises - 05/30/18 1006      Balance Exercises: Standing   Other Standing Exercises  Performed in // bars with min guard to S for safety and cues for demo: PT provided external pertubations to pt standing on foam beam to improve stepping strategy. Performed in all directions with BLE x50 reps.           PT Short Term Goals - 05/26/18 1056      PT SHORT TERM GOAL #1   Title  Pt will be IND in HEP to improve deficits listed above. TARGET DATE FOR ALL STGS: 05/23/18    Baseline  05/17/18; needed min to min gaurd assist with frequent vc to perfom exs correctly today    Status  Not Met      PT SHORT TERM GOAL #2   Title  Pt will improve gait speed to >/=1.8 ft/sec with LRAD to reduce falls risk.     Baseline  05/17/18; goal met, 3.14 ft/sec with no AD     Status  Achieved      PT SHORT TERM GOAL #3   Title  Pt will amb. 300' with LRAD at MOD I level to improve functional mobility.     Baseline  05/26/2018 Met today. Pt. is indepedent with gait without an AD.    Status  Achieved      PT SHORT TERM GOAL #4   Title  Pt will improve TUG time to </=20 sec. with LRAD to reduce falls risk.     Baseline  05/17/18; goal met 14.2 sec with no AD    Status  Achieved      PT SHORT TERM GOAL #5   Title  Pt will improve BERG score to >/=40/56 to decr. falls risk.     Baseline  05/26/2018 Met today 53/56.     Status  Achieved      PT SHORT TERM GOAL #6   Title  Pt will begin aquatic therapy to improve endurance, strength, and balance.     Baseline  05/17/18; goal met    Status  Achieved        PT Long Term Goals - 05/26/18 1058      PT LONG TERM GOAL #1   Title  Pt will verbalize understanding of CVA risk factors and s/s of CVA to reduce risk of another stroke. TARGET DATE FOR ALL LTGS: 06/20/18    Status  On-going      PT LONG TERM GOAL #2   Title  Pt will improve TUG time to </=13.5 sec. with LRAD to reduce falls risk.     Status  On-going      PT LONG TERM GOAL #3   Title  Pt will improve gait speed to >/=2.42f/sec. with LRAD to safely amb. in the community.     Status  On-going      PT LONG TERM GOAL #4   Title  Pt will improve BERG score to >/=45/56 to decr. falls risk.     Baseline  05/26/2018 Met today 53/56.    Status  Achieved      PT LONG TERM GOAL #5   Title  Pt will amb. 600' over even/uneven terrain with LRAD at  MOD I level to improve functional mobility.     Status  On-going            Plan - 05/30/18 1009    Clinical Impression Statement  Pt demonstrated progress, as he was able to progress to high level balance activities over compliant surfaces. Pt's BP was elevated after amb. but pt was late to PT, had amb. outdoors, and recently took BP meds right before PT. Pt denied s/s CVA and PT encouraged pt to assess BP  upon arriving home. Pt continues to require cues to improve trunk rotation, reciprocal arm swing, heel strike, and stride length during gait.  Continue with POC.     Rehab Potential  Good    Clinical Impairments Affecting Rehab Potential  see above    PT Frequency  2x / week    PT Duration  8 weeks    PT Treatment/Interventions  ADLs/Self Care Home Management;Biofeedback;Canalith Repostioning;Electrical Stimulation;Therapeutic activities;Functional mobility training;Stair training;Gait training;Orthotic Fit/Training;Therapeutic exercise;Manual techniques;Vestibular;Patient/family education;DME Instruction;Neuromuscular re-education;Balance training aquatic therapy    PT Next Visit Plan  work on gait without AD, hip and stepping strategy (wall bumps), L ankle stabilization, balance/gait on compliant surfaces    Consulted and Agree with Plan of Care  Patient;Family member/caregiver    Family Member Consulted  wife       Patient will benefit from skilled therapeutic intervention in order to improve the following deficits and impairments:  Abnormal gait, Decreased endurance, Impaired sensation, Decreased strength, Decreased knowledge of use of DME, Impaired UE functional use, Decreased balance, Decreased mobility, Impaired flexibility, Postural dysfunction(PT will not directly treat back pain but will monitor closely)  Visit Diagnosis: Other abnormalities of gait and mobility  Unsteadiness on feet     Problem List Patient Active Problem List   Diagnosis Date Noted  . Chronic pain syndrome   . Chronic bilateral low back pain   . Benign essential HTN   . Tobacco abuse   . Marijuana abuse   . Hyperlipidemia   . History of CVA with residual deficit   . Dysphagia, post-stroke   . ICH (intracerebral hemorrhage) (HCC) - R thalmic/PLIC d/t HTN 30/03/1101  . Insomnia 07/13/2017  . PAD (peripheral artery disease) ([place]) 07/13/2017  . Hyperlipidemia with target LDL less than 70 07/13/2017  .  Stroke-like symptom 09/22/2016  . Paresthesia 09/22/2016  . CVA (cerebral vascular accident) ([place]) 09/22/2016  . Cerebrovascular accident (CVA) due to thrombosis of left carotid artery ([place]) 09/23/2015  . Primary snoring 09/23/2015  . Hypersomnia with sleep apnea 09/23/2015  . Obstructive sleep apnea 09/23/2015  . Lacunar infarct, acute ([place]) 08/25/2015  . Sleep apnea 08/25/2015  . Dysarthria   . CVA (cerebral infarction) 08/02/2015  . Acute hyperglycemia 08/02/2015  . Systolic hypertension with cerebrovascular disease 12/27/2014  . Epistaxis 07/28/2012  . HYPERGLYCEMIA 10/02/2010  . NEPHROLITHIASIS 05/23/2009  . BARRETTS ESOPHAGUS 02/20/2009  . Gastroparesis 02/20/2009  . RADICULOPATHY 10/21/2008  . GERD 10/08/2008  . ESOPHAGITIS 08/15/2008  . ESOPHAGEAL STRICTURE 08/15/2008  . GASTRITIS, ACUTE 08/15/2008  . RENAL CYST 08/14/2008  . TOBACCO ABUSE 08/08/2008  . HEMATURIA, MICROSCOPIC, HX OF 08/08/2008  . PULMONARY NODULE 01/10/2008    Natanael Saladin L 05/30/2018, 10:12 AM  [facility] [address] [place], [place], [address] Phone: [phone]   Fax:  [phone]  Name: ANCELMO HUNT MRN: [id number] Date of Birth: [birth year]  Geoffry Paradise, PT,DPT 05/30/18 10:12 AM Phone: [phone] Fax: [phone]

## 2018-05-30 NOTE — Therapy (Signed)
[facility] [address] [place], [place], [address] Phone: [phone]   Fax:  [phone]  Speech Language Pathology Treatment  Patient Details  Name: Richard Vaughn MRN: [id number] Date of Birth: Jul 18, 1951 Referring Provider: Rosalin Hawking, MD   Encounter Date: 05/30/2018  End of Session - 05/30/18 1221    Visit Number  4    Number of Visits  17    Date for SLP Re-Evaluation  08/04/18    SLP Start Time  1017    SLP Stop Time   1059    SLP Time Calculation (min)  42 min    Activity Tolerance  Patient tolerated treatment well       Past Medical History:  Diagnosis Date  . Barrett's esophagus   . Chronic back pain   . Gastritis    Mild  . Gastroparesis   . GERD (gastroesophageal reflux disease)   . Headache   . Hyperglycemia   . Nephrolithiasis   . Renal cyst   . Stricture and stenosis of esophagus   . Stroke ([facility]) 07-2015  . Vision abnormalities     Past Surgical History:  Procedure Laterality Date  . [place] SURGERY  2012  . COLONOSCOPY    . [place] SURGERY  2005, 2010   replaced L4 and L5  . UPPER GASTROINTESTINAL ENDOSCOPY      There were no vitals filed for this visit.  Subjective Assessment - 05/30/18 1213    Subjective  "He is really having trouble with the belly breathing"    Currently in Pain?  Yes    Pain Score  4     Pain Location  Back    Pain Orientation  Lower    Pain Descriptors / Indicators  Aching    Pain Type  Chronic pain    Pain Onset  More than a month ago    Pain Frequency  Constant    Aggravating Factors   moving    Pain Relieving Factors  rest, meds    Multiple Pain Sites  No            ADULT SLP TREATMENT - 05/30/18 1213      General Information   Behavior/Cognition  Alert;Cooperative      Treatment Provided   Treatment provided  Cognitive-Linquistic      Cognitive-Linquistic Treatment   Treatment focused on  Dysarthria    Skilled Treatment   Pt completed HEP with occasional min A for SLOP, pitch glides with mod I. Pt required occasional to ususal min A to carryover compensations for dysarthria in structured task and simple conversations. Abdominal breathing achieved in audible"sh" then advanced to 1-2 word phrases with clear phonation and usual min A.       Assessment / Recommendations / Plan   Plan  Continue with current plan of care      Progression Toward Goals   Progression toward goals  Progressing toward goals       SLP Education - 05/30/18 1217    Education Details  Abdominal breathing instructions    Person(s) Educated  Patient;Spouse    Methods  Explanation;Demonstration;Verbal cues;Tactile cues    Comprehension  Verbalized understanding;Returned demonstration;Verbal cues required;Need further instruction       SLP Short Term Goals - 05/30/18 1220      SLP SHORT TERM GOAL #1   Title  pt will complete HEP with occasional min A     Time  2  Period  Weeks    Status  On-going      SLP SHORT TERM GOAL #2   Title  pt will demo compensations for dysarthria in mod complex sentence responses 90%     Time  2    Period  Weeks    Status  On-going      SLP SHORT TERM GOAL #3   Title  pt will produce 100% intelligible speech in 8 minutes simple conversation with modified independence (compensations)    Time  2    Period  Weeks    Status  On-going       SLP Long Term Goals - 05/30/18 1221      SLP LONG TERM GOAL #1   Title  pt will complete HEP for dysarthria with rare min A over three consecutive sessions    Time  6    Period  Weeks    Status  On-going      SLP LONG TERM GOAL #2   Title  pt will produce 10 minutes intelligible mod complex conversation using compensations, with rare min A over 3 consecutive sessions    Time  6    Period  Weeks    Status  On-going      SLP LONG TERM GOAL #3   Title  pt will demo speech volume in 10 minutes simple-mod complex conversation, in low 70s dB average over  three consecutive sessions    Time  6    Period  Weeks    Status  On-going       Plan - 05/30/18 1217    Clinical Impression Statement  Hoarseness present, bu strain somewhat reduced today.  Pt utilized abdmonimal breathing to achieve clear phonation and volume in short 1-2 word phrases with min A. Continue skilled ST to maximize intelligibility across community activiites and phone.    Speech Therapy Frequency  2x / week    Duration  -- 8 weeks or 17 visits    Treatment/Interventions  Oral motor exercises;SLP instruction and feedback;Compensatory strategies;Patient/family education;Environmental controls;Cognitive reorganization;Functional tasks;Cueing hierarchy;Internal/external aids    Potential to Achieve Goals  Good       Patient will benefit from skilled therapeutic intervention in order to improve the following deficits and impairments:   Dysarthria and anarthria    Problem List Patient Active Problem List   Diagnosis Date Noted  . Chronic pain syndrome   . Chronic bilateral low back pain   . Benign essential HTN   . Tobacco abuse   . Marijuana abuse   . Hyperlipidemia   . History of CVA with residual deficit   . Dysphagia, post-stroke   . ICH (intracerebral hemorrhage) (HCC) - R thalmic/PLIC d/t HTN 76/22/6333  . Insomnia 07/13/2017  . PAD (peripheral artery disease) ([place]) 07/13/2017  . Hyperlipidemia with target LDL less than 70 07/13/2017  . Stroke-like symptom 09/22/2016  . Paresthesia 09/22/2016  . CVA (cerebral vascular accident) ([place]) 09/22/2016  . Cerebrovascular accident (CVA) due to thrombosis of left carotid artery ([place]) 09/23/2015  . Primary snoring 09/23/2015  . Hypersomnia with sleep apnea 09/23/2015  . Obstructive sleep apnea 09/23/2015  . Lacunar infarct, acute ([place]) 08/25/2015  . Sleep apnea 08/25/2015  . Dysarthria   . CVA (cerebral infarction) 08/02/2015  . Acute hyperglycemia 08/02/2015  . Systolic hypertension with cerebrovascular disease  12/27/2014  . Epistaxis 07/28/2012  . HYPERGLYCEMIA 10/02/2010  . NEPHROLITHIASIS 05/23/2009  . BARRETTS ESOPHAGUS 02/20/2009  . Gastroparesis 02/20/2009  . RADICULOPATHY 10/21/2008  .  GERD 10/08/2008  . ESOPHAGITIS 08/15/2008  . ESOPHAGEAL STRICTURE 08/15/2008  . GASTRITIS, ACUTE 08/15/2008  . RENAL CYST 08/14/2008  . TOBACCO ABUSE 08/08/2008  . HEMATURIA, MICROSCOPIC, HX OF 08/08/2008  . PULMONARY NODULE 01/10/2008    Lovvorn, Annye Rusk MS, CCC-SLP 05/30/2018, 12:22 PM  [facility] [address] [place], [place], [address] Phone: [phone]   Fax:  [phone]   Name: AJAHNI NAY MRN: [id number] Date of Birth: 1951/04/28

## 2018-05-30 NOTE — Therapy (Signed)
[facility] [address] [place], [place], [address] Phone: [phone]   Fax:  [phone]  Occupational Therapy Treatment  Patient Details  Name: Richard Vaughn MRN: [id number] Date of Birth: 1951/07/16 Referring Provider: Dr. Erlinda Hong   Encounter Date: 05/30/2018  OT End of Session - 05/30/18 1303    Visit Number  8    Number of Visits  16    Date for OT Re-Evaluation  05/22/18    Authorization Type  BCBS - 75 visits for all disciplines. Counts as 1 visit if occurs on the same day. Pt approved for aquatic therapy    Authorization - Visit Number  11    Authorization - Number of Visits  70    OT Start Time  0846    OT Stop Time  0930    OT Time Calculation (min)  44 min    Activity Tolerance  Patient tolerated treatment well       Past Medical History:  Diagnosis Date  . Barrett's esophagus   . Chronic back pain   . Gastritis    Mild  . Gastroparesis   . GERD (gastroesophageal reflux disease)   . Headache   . Hyperglycemia   . Nephrolithiasis   . Renal cyst   . Stricture and stenosis of esophagus   . Stroke ([place]) 07-2015  . Vision abnormalities     Past Surgical History:  Procedure Laterality Date  . [place] SURGERY  2012  . COLONOSCOPY    . [place] SURGERY  2005, 2010   replaced L4 and L5  . UPPER GASTROINTESTINAL ENDOSCOPY      There were no vitals filed for this visit.  Subjective Assessment - 05/30/18 0849    Subjective   It's nice to know I am making progress    Patient is accompained by:  Family member wife [name]    Pertinent History  R BG CVA.  Pt has h/o of chronic low back pain prior to stroke    Patient Stated Goals  To walk better and get stronger    Currently in Pain?  Yes    Pain Score  4  worse after PT    Pain Location  Back    Pain Orientation  Lower    Pain Descriptors / Indicators  Aching    Pain Type  Chronic pain    Pain Onset  More than a month ago    Pain  Frequency  Constant    Aggravating Factors   movement    Pain Relieving Factors  rest, meds, pool therapy                   OT Treatments/Exercises (OP) - 05/30/18 0001      ADLs   ADL Comments  Assessed goals - see goal section for updates. Pt has made significant progress with LUE control and functional use, as well as balance and functional mobility      Neurological Re-education Exercises   Other Exercises 1  Neuro re ed to address activities on outdoor even and uneven sufaces incoroprating cognitive and physical task during balance challenge.  Pt intermittently had to stop moving in order to do other tasks - had 3 LOB but able to recoup with supervision.  Also addressed postural reactions with balance displacement (ankle, hip,ste) - all are improving however remain delayed.  O2 Stats on land remain above 93%          Balance  Exercises - 05/30/18 1006      Balance Exercises: Standing   Other Standing Exercises  Performed in // bars with min guard to S for safety and cues for demo: PT provided external pertubations to pt standing on foam beam to improve stepping strategy. Performed in all directions with BLE x50 reps.           OT Short Term Goals - 05/30/18 1259      OT SHORT TERM GOAL #1   Title  Pt will be mod I with HEP for coordination for LUE, balance -goals due  05/23/2018    Status  Achieved      OT SHORT TERM GOAL #2   Title  Pt will be mod I with tying shoes    Status  Achieved      OT SHORT TERM GOAL #3   Title  Pt will demonstrate improved coordination as evidenced by decreasing time on 9 hole peg with LUE by at least 7 seven seconds to assist with functional fine motor tasks.     Status  Achieved 7/116/2019   32.36      OT SHORT TERM GOAL #4   Title  Pt will be mod I with shaving    Status  Achieved      OT SHORT TERM GOAL #5   Title  Pt will be supervision with shower transfers    Status  Achieved      OT SHORT TERM GOAL #6   Title  Pt  will be mod I with simple snack/cold meal prep     Status  Achieved      OT SHORT TERM GOAL #7   Title  Pt will be mod I with loading dishwasher/washing dishes    Status  Achieved        OT Long Term Goals - 05/30/18 1300      OT LONG TERM GOAL #1   Title  Pt will be mod I with upgraded HEP - goals due 06/20/2018    Status  On-going      OT LONG TERM GOAL #2   Title  Pt will be mod I with shower transfers    Status  Achieved      OT LONG TERM GOAL #3   Title  Pt will be mod I with hot meal prep with at least two items.    Status  On-going      OT LONG TERM GOAL #4   Title  Pt demonstrate improved coordination as evidenced by decreasing time on 9 hole peg by at least 10 seconds with LUE.     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     Baseline  baseline = 29    Status  Achieved 05/30/2018  49 blocks      OT LONG TERM GOAL #7   Title  Pt will return [facility] water fitness class at [facility]    Status  On-going      OT LONG  TERM GOAL #8   Title  Pt will be mod I with familiar home mgmt tasks    Status  On-going            Plan - 05/30/18 1301    Clinical Impression Statement  Pt has met all STG's and is working toward LTG's.  Pt demonstrates improved functional use of LUE as well as functional mobility    Occupational Profile and client history currently impacting functional performance  PMH:  L BG CVA, chronic low back pain, HLD, Barrents esophagus, dysphagia, HTN, sleep apnea,     Occupational performance deficits (Please refer to evaluation for details):  ADL's;IADL's;Rest and Sleep;Leisure;Social  Participation    Rehab Potential  Good    Current Impairments/barriers affecting progress:  will monitor cognition    OT Frequency  2x / week    OT Duration  8 weeks    OT Treatment/Interventions  Self-care/ADL training;Aquatic Therapy;Moist Heat;Electrical Stimulation;Fluidtherapy;DME and/or AE instruction;Neuromuscular education;Therapeutic exercise;Functional Mobility Training;Manual Therapy;Passive range of motion;Therapeutic activities;Patient/family education;Balance training    Plan   place on hold starting 07/06/2018 and then resume therapy week of 06/19/2018 at 1x/wk.  NMR for balance, motor planning , functional use of LUE, functional ambulation, transitional movements    Consulted and Agree with Plan of Care  Patient;Family member/caregiver    Family Member Consulted  wife [name]       Patient will benefit from skilled therapeutic intervention in order to improve the following deficits and impairments:  Abnormal gait, Decreased activity tolerance, Decreased balance, Decreased coordination, Decreased mobility, Difficulty walking, Impaired UE functional use, Impaired sensation  Visit Diagnosis: Unsteadiness on feet  Muscle weakness (generalized)  Other disturbances of skin sensation  Other symptoms and signs involving the nervous system  Abnormal posture  Ataxia    Problem List Patient Active Problem List   Diagnosis Date Noted  . Chronic pain syndrome   . Chronic bilateral low back pain   . Benign essential HTN   . Tobacco abuse   . Marijuana abuse   . Hyperlipidemia   . History of CVA with residual deficit   . Dysphagia, post-stroke   . ICH (intracerebral hemorrhage) ([facility]) - R thalmic/PLIC d/t HTN 16/08/9603  . Insomnia 07/13/2017  . PAD (peripheral artery disease) ([facility]) 07/13/2017  . Hyperlipidemia with target LDL less than 70 07/13/2017  . Stroke-like symptom 09/22/2016  . Paresthesia 09/22/2016  . CVA (cerebral vascular accident) ([place]) 09/22/2016  .  Cerebrovascular accident (CVA) due to thrombosis of left carotid artery ([place]) 09/23/2015  . Primary snoring 09/23/2015  . Hypersomnia with sleep apnea 09/23/2015  . Obstructive sleep apnea 09/23/2015  . Lacunar infarct, acute ([place]) 08/25/2015  . Sleep apnea 08/25/2015  . Dysarthria   . CVA (cerebral infarction) 08/02/2015  . Acute hyperglycemia 08/02/2015  . Systolic hypertension with cerebrovascular disease 12/27/2014  . Epistaxis 07/28/2012  . HYPERGLYCEMIA 10/02/2010  . NEPHROLITHIASIS 05/23/2009  . BARRETTS ESOPHAGUS 02/20/2009  . Gastroparesis 02/20/2009  . RADICULOPATHY 10/21/2008  . GERD 10/08/2008  . ESOPHAGITIS 08/15/2008  . ESOPHAGEAL STRICTURE 08/15/2008  . GASTRITIS, ACUTE 08/15/2008  . RENAL CYST 08/14/2008  . TOBACCO ABUSE 08/08/2008  . HEMATURIA, MICROSCOPIC, HX OF 08/08/2008  . PULMONARY NODULE 01/10/2008    Quay Burow, OTR/L 05/30/2018, 1:06 PM  [facility] [address] [place], [place], [address] Phone: [phone]   Fax:  [phone]  Name: Richard Vaughn MRN: [id number] Date of Birth: Jan 19, 1951

## 2018-05-30 NOTE — Patient Instructions (Signed)
  1. Thumb on belly button, hand on abdomen  2. Suck in like you are zipping up jeans 5x  3. Say SH when you are sucking in - 5x  4. Say HEY when you are sucking in - 5X  5. Practice short phrases with sucking in while saying the phrase

## 2018-05-31 ENCOUNTER — Encounter: Payer: Self-pay | Admitting: Occupational Therapy

## 2018-05-31 ENCOUNTER — Ambulatory Visit: Payer: Federal, State, Local not specified - PPO | Admitting: Occupational Therapy

## 2018-05-31 DIAGNOSIS — R29818 Other symptoms and signs involving the nervous system: Secondary | ICD-10-CM | POA: Diagnosis not present

## 2018-05-31 DIAGNOSIS — R2681 Unsteadiness on feet: Secondary | ICD-10-CM | POA: Diagnosis not present

## 2018-05-31 DIAGNOSIS — R293 Abnormal posture: Secondary | ICD-10-CM | POA: Diagnosis not present

## 2018-05-31 DIAGNOSIS — R471 Dysarthria and anarthria: Secondary | ICD-10-CM | POA: Diagnosis not present

## 2018-05-31 DIAGNOSIS — R208 Other disturbances of skin sensation: Secondary | ICD-10-CM | POA: Diagnosis not present

## 2018-05-31 DIAGNOSIS — M6281 Muscle weakness (generalized): Secondary | ICD-10-CM | POA: Diagnosis not present

## 2018-05-31 DIAGNOSIS — R270 Ataxia, unspecified: Secondary | ICD-10-CM

## 2018-05-31 DIAGNOSIS — R2689 Other abnormalities of gait and mobility: Secondary | ICD-10-CM | POA: Diagnosis not present

## 2018-05-31 NOTE — Therapy (Signed)
[facility] [address] [place], [place], [address] Phone: [phone]   Fax:  [phone]  Occupational Therapy Treatment  Patient Details  Name: Richard Vaughn MRN: [id number] Date of Birth: 03/10/1951 Referring Provider: Dr. Erlinda Hong   Encounter Date: 05/31/2018  OT End of Session - 05/31/18 1643    Visit Number  9    Number of Visits  16    Date for OT Re-Evaluation  05/22/18    Authorization Type  [employer] - 75 visits for all disciplines. Counts as 1 visit if occurs on the same day. Pt approved for aquatic therapy    Authorization - Visit Number  13    Authorization - Number of Visits  71    OT Start Time  4627 pt arrived late    OT Stop Time  1348    OT Time Calculation (min)  44 min    Activity Tolerance  Patient tolerated treatment well       Past Medical History:  Diagnosis Date  . Barrett's esophagus   . Chronic back pain   . Gastritis    Mild  . Gastroparesis   . GERD (gastroesophageal reflux disease)   . Headache   . Hyperglycemia   . Nephrolithiasis   . Renal cyst   . Stricture and stenosis of esophagus   . Stroke ([place]) 07-2015  . Vision abnormalities     Past Surgical History:  Procedure Laterality Date  . [place] SURGERY  2012  . COLONOSCOPY    . [place] SURGERY  2005, 2010   replaced L4 and L5  . UPPER GASTROINTESTINAL ENDOSCOPY      There were no vitals filed for this visit.  Subjective Assessment - 05/31/18 1640    Patient is accompained by:  Family member wife    Pertinent History  R BG CVA.  Pt has h/o of chronic low back pain prior to stroke    Patient Stated Goals  To walk better and get stronger    Currently in Pain?  Yes    Pain Score  4     Pain Location  Back    Pain Orientation  Lower    Pain Descriptors / Indicators  Aching    Pain Type  Chronic pain    Pain Onset  More than a month ago    Pain Frequency  Constant    Aggravating Factors   moving    Pain  Relieving Factors  rest, meds, the pool       Treatment:  Pt entered the pool via steps attempting to enter without UE support - pt had to employ intermittent bilateral UE support doing one step at a time. Treatment occurred in water between 3.5 to 4.5 feet deep depending on activity.  Treatment focused on the following:  UE strengthening for flexion/extenson using large noodle  as well as horizontal abduction with trun rotation using dumb bell bar full submerged with fast movement to increase resistance ( 12 reps x2 ).  Shoulder depression and core stability using dumb bell bar behind back with tactile and vc's to encourage thoracic extension for trunk stability and cues to avoid shoulder hiking (12 reps x2).  Fast walking forward and back ward to increase resistance and facilitate more normal functional ambulation speed on land to decrease risk of falls.  Marching forward and backward in deeper water to increase resistance and increase challenge to coordination and dynamic balance.  Standing on large noodle  to facilitate postural reactions (specifically hip) - with facilitation for timing, sequence and force.  Challenged core stability using kick board for sitting and UE and LE's for movement forward and backward across pool - pt able to complete today with only close supervision (unable to complete this activity 2 weeks ago).  Utilized partially submerged ramp to walk forward and then backward (deeper to shallow and back to deeper) - pt demonstrating spontaneous arm swing and trunk rotation.  Also completed with feet closer together for more narrow BOS.  Jogging in pool to address activity tolerance, balance and gross coordination.  Pt able to maintain O2 sats today at 98% despite increased demand.  Pt and wife has begun very limited use of pool for HEP using walking activities.  Will add additional activities next session to build toward more active HEP.                       OT Short  Term Goals - 05/31/18 1641      OT SHORT TERM GOAL #1   Title  Pt will be mod I with HEP for coordination for LUE, balance -goals due  05/23/2018    Status  Achieved      OT SHORT TERM GOAL #2   Title  Pt will be mod I with tying shoes    Status  Achieved      OT SHORT TERM GOAL #3   Title  Pt will demonstrate improved coordination as evidenced by decreasing time on 9 hole peg with LUE by at least 7 seven seconds to assist with functional fine motor tasks.     Status  Achieved 7/116/2019   32.36      OT SHORT TERM GOAL #4   Title  Pt will be mod I with shaving    Status  Achieved      OT SHORT TERM GOAL #5   Title  Pt will be supervision with shower transfers    Status  Achieved      OT SHORT TERM GOAL #6   Title  Pt will be mod I with simple snack/cold meal prep     Status  Achieved      OT SHORT TERM GOAL #7   Title  Pt will be mod I with loading dishwasher/washing dishes    Status  Achieved        OT Long Term Goals - 05/31/18 1641      OT LONG TERM GOAL #1   Title  Pt will be mod I with upgraded HEP - goals due 06/20/2018    Status  On-going      OT LONG TERM GOAL #2   Title  Pt will be mod I with shower transfers    Status  Achieved      OT LONG TERM GOAL #3   Title  Pt will be mod I with hot meal prep with at least two items.    Status  On-going      OT LONG TERM GOAL #4   Title  Pt demonstrate improved coordination as evidenced by decreasing time on 9 hole peg by at least 10 seconds with LUE.     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     Baseline  baseline = 29    Status  Achieved  05/30/2018  49 blocks      OT LONG TERM GOAL #7   Title  Pt will return to water fitness class at [facility]    Status  On-going      OT LONG TERM GOAL #8   Title  Pt will be mod I with familiar home mgmt tasks    Status  On-going            Plan - 05/31/18 1642    Clinical Impression Statement  Pt continues to progress toward goals.  Pt with improved activity tolerance and maintainend O2 sats during aquatic therapy.    Occupational Profile and client history currently impacting functional performance  PMH:  L BG CVA, chronic low back pain, HLD, Barrents esophagus, dysphagia, HTN, sleep apnea,     Occupational performance deficits (Please refer to evaluation for details):  ADL's;IADL's;Rest and Sleep;Leisure;Social Participation    Rehab Potential  Good    Current Impairments/barriers affecting progress:  will monitor cognition    OT Frequency  2x / week    OT Duration  8 weeks    Consulted and Agree with Plan of Care  Patient;Family member/caregiver    Family Member Consulted  wife Richard Vaughn       Patient will benefit from skilled therapeutic intervention in order to improve the following deficits and impairments:  Abnormal gait, Decreased activity tolerance, Decreased balance, Decreased coordination, Decreased mobility, Difficulty walking, Impaired UE functional use, Impaired sensation  Visit Diagnosis: Unsteadiness on feet  Muscle weakness (generalized)  Other disturbances of skin sensation  Other symptoms and signs involving the nervous system  Abnormal posture  Ataxia    Problem List Patient Active Problem List   Diagnosis Date Noted  . Chronic pain syndrome   . Chronic bilateral low back pain   . Benign essential HTN   . Tobacco abuse   . Marijuana abuse   . Hyperlipidemia   . History of CVA with residual deficit   . Dysphagia, post-stroke   . ICH (intracerebral hemorrhage) (HCC) - R thalmic/PLIC d/t HTN 40/06/6760  . Insomnia 07/13/2017  . PAD (peripheral  artery disease) ([facility]) 07/13/2017  . Hyperlipidemia with target LDL less than 70 07/13/2017  . Stroke-like symptom 09/22/2016  . Paresthesia 09/22/2016  . CVA (cerebral vascular accident) ([facility]) 09/22/2016  . Cerebrovascular accident (CVA) due to thrombosis of left carotid artery ([place]) 09/23/2015  . Primary snoring 09/23/2015  . Hypersomnia with sleep apnea 09/23/2015  . Obstructive sleep apnea 09/23/2015  . Lacunar infarct, acute ([place]) 08/25/2015  . Sleep apnea 08/25/2015  . Dysarthria   . CVA (cerebral infarction) 08/02/2015  . Acute hyperglycemia 08/02/2015  . Systolic hypertension with cerebrovascular disease 12/27/2014  . Epistaxis 07/28/2012  . HYPERGLYCEMIA 10/02/2010  . NEPHROLITHIASIS 05/23/2009  . BARRETTS ESOPHAGUS 02/20/2009  . Gastroparesis 02/20/2009  . RADICULOPATHY 10/21/2008  . GERD 10/08/2008  . ESOPHAGITIS 08/15/2008  . ESOPHAGEAL STRICTURE 08/15/2008  . GASTRITIS, ACUTE 08/15/2008  . RENAL CYST 08/14/2008  . TOBACCO ABUSE 08/08/2008  . HEMATURIA, MICROSCOPIC, HX OF 08/08/2008  . PULMONARY NODULE 01/10/2008    Quay Burow, OTR/L 05/31/2018, 4:45 PM  [facility]  [facility] [address] [place], [place], [address] Phone: [phone]   Fax:  [phone]  Name: Richard Vaughn MRN: [id number] Date of Birth: [birth year]

## 2018-06-01 ENCOUNTER — Ambulatory Visit
Admission: RE | Admit: 2018-06-01 | Discharge: 2018-06-01 | Disposition: A | Discharge status: Home / Self Care | Payer: Federal, State, Local not specified - PPO | Source: Ambulatory Visit | Attending: Adult Health | Admitting: Adult Health

## 2018-06-01 DIAGNOSIS — I610 Nontraumatic intracerebral hemorrhage in hemisphere, subcortical: Secondary | ICD-10-CM | POA: Diagnosis not present

## 2018-06-02 ENCOUNTER — Ambulatory Visit: Payer: Federal, State, Local not specified - PPO | Admitting: Physical Therapy

## 2018-06-02 ENCOUNTER — Encounter: Payer: Federal, State, Local not specified - PPO | Admitting: Occupational Therapy

## 2018-06-02 ENCOUNTER — Ambulatory Visit: Payer: Federal, State, Local not specified - PPO

## 2018-06-02 ENCOUNTER — Encounter: Payer: Self-pay | Admitting: Physical Therapy

## 2018-06-02 DIAGNOSIS — M6281 Muscle weakness (generalized): Secondary | ICD-10-CM

## 2018-06-02 DIAGNOSIS — R2681 Unsteadiness on feet: Secondary | ICD-10-CM | POA: Diagnosis not present

## 2018-06-02 DIAGNOSIS — R293 Abnormal posture: Secondary | ICD-10-CM | POA: Diagnosis not present

## 2018-06-02 DIAGNOSIS — R270 Ataxia, unspecified: Secondary | ICD-10-CM | POA: Diagnosis not present

## 2018-06-02 DIAGNOSIS — R2689 Other abnormalities of gait and mobility: Secondary | ICD-10-CM

## 2018-06-02 DIAGNOSIS — R29818 Other symptoms and signs involving the nervous system: Secondary | ICD-10-CM | POA: Diagnosis not present

## 2018-06-02 DIAGNOSIS — R471 Dysarthria and anarthria: Secondary | ICD-10-CM | POA: Diagnosis not present

## 2018-06-02 DIAGNOSIS — R208 Other disturbances of skin sensation: Secondary | ICD-10-CM | POA: Diagnosis not present

## 2018-06-02 NOTE — Therapy (Signed)
[facility] [address] [place], [place], [address] Phone: [phone]   Fax:  [phone]  Speech Language Pathology Treatment  Patient Details  Name: Richard Vaughn MRN: [id number] Date of Birth: 1951/05/05 Referring Provider: Rosalin Hawking, MD   Encounter Date: 06/02/2018  End of Session - 06/02/18 1607    Visit Number  5    Number of Visits  17    Date for SLP Re-Evaluation  08/04/18    SLP Start Time  0852 pt 6 minutes late    SLP Stop Time   0931    SLP Time Calculation (min)  39 min    Activity Tolerance  Patient tolerated treatment well       Past Medical History:  Diagnosis Date  . Barrett's esophagus   . Chronic back pain   . Gastritis    Mild  . Gastroparesis   . GERD (gastroesophageal reflux disease)   . Headache   . Hyperglycemia   . Nephrolithiasis   . Renal cyst   . Stricture and stenosis of esophagus   . Stroke ([place]) 07-2015  . Vision abnormalities     Past Surgical History:  Procedure Laterality Date  . [place] SURGERY  2012  . COLONOSCOPY    . [place] SURGERY  2005, 2010   replaced L4 and L5  . UPPER GASTROINTESTINAL ENDOSCOPY      There were no vitals filed for this visit.  Subjective Assessment - 06/02/18 0858    Subjective  "(the abdominal breathing) doesn't feel natural."    Pain Score  4     Pain Location  Back    Pain Orientation  Lower    Pain Descriptors / Indicators  Aching    Pain Type  Chronic pain    Pain Radiating Towards  BLEs    Pain Onset  More than a month ago    Pain Frequency  Constant    Aggravating Factors   moving    Pain Relieving Factors  meds, rest            ADULT SLP TREATMENT - 06/02/18 0907      General Information   Behavior/Cognition  Alert;Cooperative;Pleasant mood      Treatment Provided   Treatment provided  Cognitive-Linquistic      Cognitive-Linquistic Treatment   Treatment focused on  Dysarthria    Skilled  Treatment  SLP targeted abdominal breathing (AB) with the patient today, as speech with AB was difficult for him. Pt was able to have success with AB at rest routinely and was told to practice 15 minutes x2/day. Then SLP targeted pt's dysarthria HEP which he accomplished with occasional min A for details. Pt admitted to attempting dysarthria HEP but "losing focus" and requiring to start over again due to losing count. SLP suggested 1) having wife help, 2) getting lap counter 3) count the rep # before each rep in order to keep track.       Assessment / Recommendations / Plan   Plan  Continue with current plan of care      Progression Toward Goals   Progression toward goals  Progressing toward goals       SLP Education - 06/02/18 1607    Education Details  Abdominal breathing at rest, how to track rep numbers for HEP    Person(s) Educated  Patient    Methods  Explanation;Demonstration;Verbal cues    Comprehension  Verbalized understanding;Returned demonstration;Need further instruction  SLP Short Term Goals - 06/02/18 1609      SLP SHORT TERM GOAL #1   Title  pt will complete HEP with occasional min A     Time  2    Period  Weeks    Status  On-going      SLP SHORT TERM GOAL #2   Title  pt will demo compensations for dysarthria in mod complex sentence responses 90%     Time  2    Period  Weeks    Status  On-going      SLP SHORT TERM GOAL #3   Title  pt will produce 100% intelligible speech in 8 minutes simple conversation with modified independence (compensations)    Time  2    Period  Weeks    Status  On-going       SLP Long Term Goals - 06/02/18 1609      SLP LONG TERM GOAL #1   Title  pt will complete HEP for dysarthria with rare min A over three consecutive sessions    Time  6    Period  Weeks    Status  On-going      SLP LONG TERM GOAL #2   Title  pt will produce 10 minutes intelligible mod complex conversation using compensations, with rare min A over 3  consecutive sessions    Time  6    Period  Weeks    Status  On-going      SLP LONG TERM GOAL #3   Title  pt will demo speech volume in 10 minutes simple-mod complex conversation, in low 70s dB average over three consecutive sessions    Time  6    Period  Weeks    Status  On-going       Plan - 06/02/18 1608    Clinical Impression Statement  Continued training in HEP for dysarthria, and also for abdominal breathing. Pt was provided ideas for tracking rep count. Continue skilled ST to maximize intelligiblity across settings.     Speech Therapy Frequency  2x / week    Duration  -- 8 weeks/17 visits    Treatment/Interventions  Oral motor exercises;SLP instruction and feedback;Compensatory strategies;Patient/family education;Environmental controls;Cognitive reorganization;Functional tasks;Cueing hierarchy;Internal/external aids    Potential to Achieve Goals  Good    Consulted and Agree with Plan of Care  Patient       Patient will benefit from skilled therapeutic intervention in order to improve the following deficits and impairments:   Dysarthria and anarthria    Problem List Patient Active Problem List   Diagnosis Date Noted  . Chronic pain syndrome   . Chronic bilateral low back pain   . Benign essential HTN   . Tobacco abuse   . Marijuana abuse   . Hyperlipidemia   . History of CVA with residual deficit   . Dysphagia, post-stroke   . ICH (intracerebral hemorrhage) ([facility]CC) - R thalmic/PLIC d/t HTN 83/66/2947  . Insomnia 07/13/2017  . PAD (peripheral artery disease) ([place]) 07/13/2017  . Hyperlipidemia with target LDL less than 70 07/13/2017  . Stroke-like symptom 09/22/2016  . Paresthesia 09/22/2016  . CVA (cerebral vascular accident) ([facility]) 09/22/2016  . Cerebrovascular accident (CVA) due to thrombosis of left carotid artery ([place]) 09/23/2015  . Primary snoring 09/23/2015  . Hypersomnia with sleep apnea 09/23/2015  . Obstructive sleep apnea 09/23/2015  . Lacunar infarct,  acute ([place]) 08/25/2015  . Sleep apnea 08/25/2015  . Dysarthria   . CVA (cerebral infarction)  08/02/2015  . Acute hyperglycemia 08/02/2015  . Systolic hypertension with cerebrovascular disease 12/27/2014  . Epistaxis 07/28/2012  . HYPERGLYCEMIA 10/02/2010  . NEPHROLITHIASIS 05/23/2009  . BARRETTS ESOPHAGUS 02/20/2009  . Gastroparesis 02/20/2009  . RADICULOPATHY 10/21/2008  . GERD 10/08/2008  . ESOPHAGITIS 08/15/2008  . ESOPHAGEAL STRICTURE 08/15/2008  . GASTRITIS, ACUTE 08/15/2008  . RENAL CYST 08/14/2008  . TOBACCO ABUSE 08/08/2008  . HEMATURIA, MICROSCOPIC, HX OF 08/08/2008  . PULMONARY NODULE 01/10/2008    [facility] ,[place], [place]  06/02/2018, 4:09 PM  [facility] [address] [place], [place], [address] Phone: [phone]   Fax:  [phone]   Name: Richard Vaughn MRN: [id number] Date of Birth: [birth year]

## 2018-06-02 NOTE — Therapy (Signed)
[facility] [address] [place], [place], [address] Phone: [phone]   Fax:  [phone]  Physical Therapy Treatment  Patient Details  Name: Richard KEEVEN MRN: [id number] Date of Birth: [birth year] Referring Provider: Dr. Erlinda Hong   Encounter Date: 06/02/2018  PT End of Session - 06/02/18 1024    Visit Number  8 PT visits    Number of Visits  17    Date for PT Re-Evaluation  06/24/18    Authorization Type  Federal BCBS: 75 visits combines-awaiting to see if aquatic therapy is covered    Authorization - Visit Number  14 total visits    Authorization - Number of Visits  75    PT Start Time  1019    PT Stop Time  1100    PT Time Calculation (min)  41 min    Equipment Utilized During Treatment  Gait belt    Activity Tolerance  Patient tolerated treatment well    Behavior During Therapy  WFL for tasks assessed/performed       Past Medical History:  Diagnosis Date  . Barrett's esophagus   . Chronic back pain   . Gastritis    Mild  . Gastroparesis   . GERD (gastroesophageal reflux disease)   . Headache   . Hyperglycemia   . Nephrolithiasis   . Renal cyst   . Stricture and stenosis of esophagus   . Stroke ([place]) 07-2015  . Vision abnormalities     Past Surgical History:  Procedure Laterality Date  . [place] SURGERY  2012  . COLONOSCOPY    . [place] SURGERY  2005, 2010   replaced L4 and L5  . UPPER GASTROINTESTINAL ENDOSCOPY      There were no vitals filed for this visit.  Subjective Assessment - 06/02/18 1022    Subjective  Pt denied falls or changes since last visit. He states that they have planned their beach trip for October and he would like to be more comfortable with his balance by then.    Pertinent History  HTN, chronic back pain, HLD, visual abnormalities, sleep apnea, PAD, hx of L BG CVA    Patient Stated Goals  Walk without RW and get back to water aerobics at the [facility]     Currently in Pain?  Yes    Pain Score  -- 2-3/10    Pain Location  Back    Pain Orientation  Lower    Pain Descriptors / Indicators  Aching    Pain Type  Chronic pain    Pain Onset  More than a month ago    Multiple Pain Sites  No          Gait- approx. 350 ft around track in gym for the purpose of increasing reciprocal arm swing and trunk rotation using ski poles. The pt. held the handles and the SPTA held the other and of the poles behind the pt and as he walked, the SPTA exaggerated the reciprocal arm swing that should accompany his steps. The patient walked at a comfortable pace for one full lap (approx. 115 ft.) then the SPTA had him speed up on the long stretches for the second lap (approx. 30 ft. each). Finally, the SPTA had the patient walk a lap without the poles and try to maintain the reciprocal arm swing that he had with the poles. He was successful initially, but the Left arm swing decreased the further he went, while he was  able to maintain most of the arm swing in the Right arm.  Neuro Re-Ed: Blue Mat on Ramp- forward stepping with weight shift uphill x10 bil, min guard due to slight LOBs but pt was able to recover on his own; backward stepping downhill x10 bil, min guard-min assist due to LOBs with patient mostly able to recover on his own without reaching out to steady himself on anything; forward stepping downhill x10 bil, min guard due to VCs to keep weight on heels and knee straight to avoid buckling and a couple of slight wavers in balance; backward stepping uphill x10 bil, min guard-min assist due to increased muscular fatigue and LOBs, pt required rest at this point Rockerboard in // with emphasis on tall posture and use of PF and DF to rock board and VCs to avoid substitutions with knees and hips, 10x PF and 10x DF min guard for both, 2x 30 sec attempt to maintain level hold in ant/post direction; lateral weight shift no UE support on rocker board min guard- min assist due to  slight LOB. Obstacle Course- Red mat over bean bags, stepping stones x6, made it through with only slight stumbles x4 rounds, min guard-min assist     PT Short Term Goals - 05/26/18 1056      PT SHORT TERM GOAL #1   Title  Pt will be IND in HEP to improve deficits listed above. TARGET DATE FOR ALL STGS: 05/23/18    Baseline  05/17/18; needed min to min gaurd assist with frequent vc to perfom exs correctly today    Status  Not Met      PT SHORT TERM GOAL #2   Title  Pt will improve gait speed to >/=1.8 ft/sec with LRAD to reduce falls risk.     Baseline  05/17/18; goal met, 3.14 ft/sec with no AD    Status  Achieved      PT SHORT TERM GOAL #3   Title  Pt will amb. 300' with LRAD at MOD I level to improve functional mobility.     Baseline  05/26/2018 Met today. Pt. is indepedent with gait without an AD.    Status  Achieved      PT SHORT TERM GOAL #4   Title  Pt will improve TUG time to </=20 sec. with LRAD to reduce falls risk.     Baseline  05/17/18; goal met 14.2 sec with no AD    Status  Achieved      PT SHORT TERM GOAL #5   Title  Pt will improve BERG score to >/=40/56 to decr. falls risk.     Baseline  05/26/2018 Met today 53/56.     Status  Achieved      PT SHORT TERM GOAL #6   Title  Pt will begin aquatic therapy to improve endurance, strength, and balance.     Baseline  05/17/18; goal met    Status  Achieved        PT Long Term Goals - 05/26/18 1058      PT LONG TERM GOAL #1   Title  Pt will verbalize understanding of CVA risk factors and s/s of CVA to reduce risk of another stroke. TARGET DATE FOR ALL LTGS: 06/20/18    Status  On-going      PT LONG TERM GOAL #2   Title  Pt will improve TUG time to </=13.5 sec. with LRAD to reduce falls risk.     Status  On-going  PT LONG TERM GOAL #3   Title  Pt will improve gait speed to >/=2.41f/sec. with LRAD to safely amb. in the community.     Status  On-going      PT LONG TERM GOAL #4   Title  Pt will improve BERG  score to >/=45/56 to decr. falls risk.     Baseline  05/26/2018 Met today 53/56.    Status  Achieved      PT LONG TERM GOAL #5   Title  Pt will amb. 600' over even/uneven terrain with LRAD at MOD I level to improve functional mobility.     Status  On-going        Plan - 06/02/18 1229    Clinical Impression Statement  Today's session focused on balance on compliant/uneven surfaces in preparation for the patient's upcoming beach trip and a small focus was made on regaining reciprocal arm swing and trunk rotation during gait. Pt. would benefit from continued PT to reach remaining goals and continue working towards returning to his functional baseline.     Rehab Potential  Good    Clinical Impairments Affecting Rehab Potential  see above    PT Frequency  2x / week    PT Duration  8 weeks    PT Treatment/Interventions  ADLs/Self Care Home Management;Biofeedback;Canalith Repostioning;Electrical Stimulation;Therapeutic activities;Functional mobility training;Stair training;Gait training;Orthotic Fit/Training;Therapeutic exercise;Manual techniques;Vestibular;Patient/family education;DME Instruction;Neuromuscular re-education;Balance training aquatic therapy    PT Next Visit Plan   high level balance/gait on compliant surfaces, gait using poles to encourage reciprocal arm swing and trunk rotation    Consulted and Agree with Plan of Care  Patient;Family member/caregiver    Family Member Consulted  wife       Patient will benefit from skilled therapeutic intervention in order to improve the following deficits and impairments:  Abnormal gait, Decreased endurance, Impaired sensation, Decreased strength, Decreased knowledge of use of DME, Impaired UE functional use, Decreased balance, Decreased mobility, Impaired flexibility, Postural dysfunction(PT will not directly treat back pain but will monitor closely)  Visit Diagnosis: Unsteadiness on feet  Muscle weakness (generalized)  Other abnormalities of  gait and mobility     Problem List Patient Active Problem List   Diagnosis Date Noted  . Chronic pain syndrome   . Chronic bilateral low back pain   . Benign essential HTN   . Tobacco abuse   . Marijuana abuse   . Hyperlipidemia   . History of CVA with residual deficit   . Dysphagia, post-stroke   . ICH (intracerebral hemorrhage) (HCC) - R thalmic/PLIC d/t HTN 057/32/2025 . Insomnia 07/13/2017  . PAD (peripheral artery disease) ([facility] 07/13/2017  . Hyperlipidemia with target LDL less than 70 07/13/2017  . Stroke-like symptom 09/22/2016  . Paresthesia 09/22/2016  . CVA (cerebral vascular accident) ([facility] 09/22/2016  . Cerebrovascular accident (CVA) due to thrombosis of left carotid artery ([facility] 09/23/2015  . Primary snoring 09/23/2015  . Hypersomnia with sleep apnea 09/23/2015  . Obstructive sleep apnea 09/23/2015  . Lac[place]ar infarct, acute ([facility] 08/25/2015  . Sleep apnea 08/25/2015  . Dysarthria   . CVA (cerebral infarction) 08/02/2015  . Acute hyperglycemia 08/02/2015  . Systolic hypertension with cerebrovascular disease 12/27/2014  . Epistaxis 07/28/2012  . HYPERGLYCEMIA 10/02/2010  . NEPHROLITHIASIS 05/23/2009  . BARRETTS ESOPHAGUS 02/20/2009  . Gastroparesis 02/20/2009  . RADICULOPATHY 10/21/2008  . GERD 10/08/2008  . ESOPHAGITIS 08/15/2008  . ESOPHAGEAL STRICTURE 08/15/2008  . GASTRITIS, ACUTE 08/15/2008  . RENAL CYST 08/14/2008  . TOBACCO ABUSE 08/08/2008  .  HEMATURIA, MICROSCOPIC, HX OF 08/08/2008  . PULMONARY NODULE 01/10/2008    Arthor Captain, SPTA 06/02/2018, 3:09 PM  [facility] [address] [place], [place], [address] Phone: [phone]   Fax:  [phone]  Name: Richard Vaughn MRN: [id number] Date of Birth: 05/26/1951

## 2018-06-05 ENCOUNTER — Telehealth: Payer: Self-pay

## 2018-06-05 ENCOUNTER — Telehealth: Payer: Self-pay | Admitting: Adult Health

## 2018-06-05 MED ORDER — ASPIRIN 81 MG PO TABS: 81.0000 mg | ORAL_TABLET | Freq: Every day | ORAL | Status: DC

## 2018-06-05 NOTE — Telephone Encounter (Signed)
Notes recorded by Marval Regal, RN on 06/05/2018 at 4:18 PM EDT Left vm for patient to call back about Ct scan results, and restart aspirin.

## 2018-06-05 NOTE — Telephone Encounter (Signed)
-----   Message from Venancio Poisson, NP sent at 06/05/2018  7:35 AM EDT ----- Please notify patient to restart aspirin 81mg  daily as his recent CT scan showed resolution of ICH. Thank you.

## 2018-06-05 NOTE — Telephone Encounter (Signed)
Restart aspirin 81mg  daily as resolution of previous ICH.

## 2018-06-06 NOTE — Telephone Encounter (Signed)
RN was giving wife Ct scan results. Rn stated pt was suppose to be talking with her about if they wanted to take crestor or start the fenofibrate his PCP prescribed. Rn stated the pt did not call back to let us know which statin they wanted to take. The wife stated being that the PCP prescribed fenofibrate they will call the pharmacy for that medication.

## 2018-06-06 NOTE — Telephone Encounter (Signed)
Notes recorded by Marval Regal, RN on 06/06/2018 at 11:39 AM EDT Rn receive call from wife about the Ct scan. She is on dpr. Rn stated the Ct showed resolution of ICH. Please restart the aspirin 81mg . Pts wife verbalized understanding. ------

## 2018-06-08 ENCOUNTER — Ambulatory Visit: Payer: Federal, State, Local not specified - PPO | Admitting: Speech Pathology

## 2018-06-08 ENCOUNTER — Encounter: Payer: Federal, State, Local not specified - PPO | Admitting: Occupational Therapy

## 2018-06-08 ENCOUNTER — Encounter: Payer: Self-pay | Admitting: Speech Pathology

## 2018-06-08 ENCOUNTER — Encounter: Payer: Self-pay | Admitting: Rehabilitation

## 2018-06-08 ENCOUNTER — Ambulatory Visit: Payer: Federal, State, Local not specified - PPO | Admitting: Rehabilitation

## 2018-06-08 DIAGNOSIS — M6281 Muscle weakness (generalized): Secondary | ICD-10-CM

## 2018-06-08 DIAGNOSIS — R29818 Other symptoms and signs involving the nervous system: Secondary | ICD-10-CM | POA: Diagnosis not present

## 2018-06-08 DIAGNOSIS — R2689 Other abnormalities of gait and mobility: Secondary | ICD-10-CM

## 2018-06-08 DIAGNOSIS — R471 Dysarthria and anarthria: Secondary | ICD-10-CM

## 2018-06-08 DIAGNOSIS — R293 Abnormal posture: Secondary | ICD-10-CM | POA: Diagnosis not present

## 2018-06-08 DIAGNOSIS — R2681 Unsteadiness on feet: Secondary | ICD-10-CM

## 2018-06-08 DIAGNOSIS — R270 Ataxia, unspecified: Secondary | ICD-10-CM | POA: Diagnosis not present

## 2018-06-08 DIAGNOSIS — R208 Other disturbances of skin sensation: Secondary | ICD-10-CM | POA: Diagnosis not present

## 2018-06-08 NOTE — Patient Instructions (Signed)
Practice "[place]" sucking abs in 5x Practice "HEY" sucking abs in  Now breathe in with belly out then say "HEY YOU" with belly in    I'm retired  Come here [employer] a cocker spaniel  I plant impatients  Dance movement psychotherapist, sunflower and blackeyed susans  Pick the turnips and collards  Time to blow the leaves  Let's pick up the sticks  Did you feel Coco?  Put Coco out  [place] wants in  We need bait  I'm going to [place]  We catch drum

## 2018-06-08 NOTE — Therapy (Signed)
[facility] [address] [place], [place], [address] Phone: [phone]   Fax:  [phone]  Physical Therapy Treatment  Patient Details  Name: Richard Vaughn MRN: [id number] Date of Birth: 1951/10/01 Referring Provider: Dr. Erlinda Hong   Encounter Date: 06/08/2018  PT End of Session - 06/08/18 1219    Visit Number  9 PT visits    Number of Visits  17    Date for PT Re-Evaluation  06/24/18    Authorization Type  Federal BCBS: 75 visits combines-awaiting to see if aquatic therapy is covered    Authorization - Visit Number  17 total visits    Authorization - Number of Visits  75    PT Start Time  [phone]    PT Stop Time  1018    PT Time Calculation (min)  40 min    Equipment Utilized During Treatment  Gait belt    Activity Tolerance  Patient tolerated treatment well    Behavior During Therapy  WFL for tasks assessed/performed       Past Medical History:  Diagnosis Date  . Barrett's esophagus   . Chronic back pain   . Gastritis    Mild  . Gastroparesis   . GERD (gastroesophageal reflux disease)   . Headache   . Hyperglycemia   . Nephrolithiasis   . Renal cyst   . Stricture and stenosis of esophagus   . Stroke ([place]) 07-2015  . Vision abnormalities     Past Surgical History:  Procedure Laterality Date  . [place] SURGERY  2012  . COLONOSCOPY    . [place] SURGERY  2005, 2010   replaced L4 and L5  . UPPER GASTROINTESTINAL ENDOSCOPY      There were no vitals filed for this visit.  Subjective Assessment - 06/08/18 0941    Subjective  Pt reports continued chronic back pain, no falls.     Patient is accompained by:  Family member    Pertinent History  HTN, chronic back pain, HLD, visual abnormalities, sleep apnea, PAD, hx of L BG CVA    Patient Stated Goals  Walk without RW and get back to water aerobics at the [facility]    Currently in Pain?  Yes    Pain Score  4     Pain Location  Back    Pain  Orientation  Lower    Pain Descriptors / Indicators  Aching    Pain Type  Chronic pain    Pain Onset  More than a month ago    Pain Frequency  Constant    Aggravating Factors   moving    Pain Relieving Factors  meds, rest          [facility] PT Assessment - 06/08/18 1002      Functional Gait  Assessment   Gait assessed   Yes    Gait Level Surface  Walks 20 ft in less than 5.5 sec, no assistive devices, good speed, no evidence for imbalance, normal gait pattern, deviates no more than 6 in outside of the 12 in walkway width.    Change in Gait Speed  Able to smoothly change walking speed without loss of balance or gait deviation. Deviate no more than 6 in outside of the 12 in walkway width.    Gait with Horizontal Head Turns  Performs head turns smoothly with no change in gait. Deviates no more than 6 in outside 12 in walkway width    Gait  with Vertical Head Turns  Performs head turns with no change in gait. Deviates no more than 6 in outside 12 in walkway width.    Gait and Pivot Turn  Pivot turns safely within 3 sec and stops quickly with no loss of balance.    Step Over Obstacle  Is able to step over 2 stacked shoe boxes taped together (9 in total height) without changing gait speed. No evidence of imbalance.    Gait with Narrow Base of Support  Ambulates 4-7 steps.    Gait with Eyes Closed  Walks 20 ft, uses assistive device, slower speed, mild gait deviations, deviates 6-10 in outside 12 in walkway width. Ambulates 20 ft in less than 9 sec but greater than 7 sec.    Ambulating Backwards  Walks 20 ft, uses assistive device, slower speed, mild gait deviations, deviates 6-10 in outside 12 in walkway width.    Steps  Alternating feet, no rail.    Total Score  26                   [facility] Adult PT Treatment/Exercise - 06/08/18 1000      Ambulation/Gait   Ambulation/Gait  Yes    Ambulation/Gait Assistance  6: Modified independent (Device/Increase time)    Ambulation/Gait Assistance  Details  Had pt ambulate outdoors during session over very uneven and grassy area (across street laterally from clinic) as pt reports concern about going to the beach in Oct.  Pt did very well with no unsteadiness or LOB.  Min cues to increase LLE hip and knee flexion but did well overall.     Ambulation Distance (Feet)  800 Feet    Assistive device  None    Gait Pattern  Step-through pattern;Decreased stride length;Decreased dorsiflexion - left;Decreased dorsiflexion - right;Decreased arm swing - right;Decreased arm swing - left    Ambulation Surface  Unlevel;Outdoor;Grass;Paved      Self-Care   Self-Care  Other Self-Care Comments    Other Self-Care Comments   Pt asking questions for when he thought he would be back to "normal" with the left leg.  Discussed that it is difficult to give exact time line, but feel that based on the progress he has already made and his continued motivation to challenge himself at home, recovery is more likely than not.  Pt verbalized understanding.       Neuro Re-ed    Neuro Re-ed Details   Standing perpendicularly uphill on foam balance beam on ramp, maintaining balance x 2 sets of 20 secs progressing to forward steps alternating LEs and return to beam x 10 reps on each side, posterior stepping and return to beam x 10 reps on each side with intermittent support as needed and cues for increased L hip and knee flexion.  Standing perpendiculary on half foam roll x 2 sets of 20 secs with intermittent UE support with decreased control in L ankle noted, tandem stance on half foam roll x 2 sets of 20 secs each direction again with intermittent UE support.        Knee/Hip Exercises: Stretches   Active Hamstring Stretch  Both;1 rep;60 seconds    Active Hamstring Stretch Limitations  VC's for upright posture      Knee/Hip Exercises: Supine   Other Supine Knee/Hip Exercises  knee to chest x 30 secs on each side    Other Supine Knee/Hip Exercises  posterior pelvic tilt x 10  reps with 5 sec hold  PT Education - 06/08/18 1212    Education Details  added flexibility for decreased back pain to HEP, education on CVA recovery and continued balance training-how to challenge himself at the beach on sand.     Person(s) Educated  Patient    Methods  Explanation    Comprehension  Verbalized understanding       PT Short Term Goals - 05/26/18 1056      PT SHORT TERM GOAL #1   Title  Pt will be IND in HEP to improve deficits listed above. TARGET DATE FOR ALL STGS: 05/23/18    Baseline  05/17/18; needed min to min gaurd assist with frequent vc to perfom exs correctly today    Status  Not Met      PT SHORT TERM GOAL #2   Title  Pt will improve gait speed to >/=1.8 ft/sec with LRAD to reduce falls risk.     Baseline  05/17/18; goal met, 3.14 ft/sec with no AD    Status  Achieved      PT SHORT TERM GOAL #3   Title  Pt will amb. 300' with LRAD at MOD I level to improve functional mobility.     Baseline  05/26/2018 Met today. Pt. is indepedent with gait without an AD.    Status  Achieved      PT SHORT TERM GOAL #4   Title  Pt will improve TUG time to </=20 sec. with LRAD to reduce falls risk.     Baseline  05/17/18; goal met 14.2 sec with no AD    Status  Achieved      PT SHORT TERM GOAL #5   Title  Pt will improve BERG score to >/=40/56 to decr. falls risk.     Baseline  05/26/2018 Met today 53/56.     Status  Achieved      PT SHORT TERM GOAL #6   Title  Pt will begin aquatic therapy to improve endurance, strength, and balance.     Baseline  05/17/18; goal met    Status  Achieved        PT Long Term Goals - 06/08/18 1223      PT LONG TERM GOAL #1   Title  Pt will verbalize understanding of CVA risk factors and s/s of CVA to reduce risk of another stroke. TARGET DATE FOR ALL LTGS: 06/20/18    Status  On-going      PT LONG TERM GOAL #2   Title  Pt will improve TUG time to </=13.5 sec. with LRAD to reduce falls risk.     Status  On-going       PT LONG TERM GOAL #3   Title  Pt will improve gait speed to >/=2.26f/sec. with LRAD to safely amb. in the community.     Status  On-going      PT LONG TERM GOAL #4   Title  Pt will improve BERG score to >/=45/56 to decr. falls risk.     Baseline  05/26/2018 Met today 53/56.    Status  Achieved      PT LONG TERM GOAL #5   Title  Pt will amb. 600' over even/uneven terrain with LRAD at MOD I level to improve functional mobility.     Status  On-going      Additional Long Term Goals   Additional Long Term Goals  Yes      PT LONG TERM GOAL #6   Title  Pt will improve FGA  to 28/30 in order to indicate decreased fall risk.     Status  New            Plan - 06/08/18 1221    Clinical Impression Statement  Skilled session began with additional 3 exercises to reduce back pain and carryover to improved gait pattern, outdoor gait to prepare for beach trip and high level balance to elicit more stance control in LLE and also to increase L hip and knee flexion.  Pt making excellent progress.  Note that he is close to D/C, therefore assessed balance with FGA.  Note score of 26/30 indicative of low fall risk, will add goal to address.     Rehab Potential  Good    Clinical Impairments Affecting Rehab Potential  see above    PT Frequency  2x / week    PT Duration  8 weeks    PT Treatment/Interventions  ADLs/Self Care Home Management;Biofeedback;Canalith Repostioning;Electrical Stimulation;Therapeutic activities;Functional mobility training;Stair training;Gait training;Orthotic Fit/Training;Therapeutic exercise;Manual techniques;Vestibular;Patient/family education;DME Instruction;Neuromuscular re-education;Balance training aquatic therapy    PT Next Visit Plan   high level balance/gait on compliant surfaces, gait using poles to encourage reciprocal arm swing and trunk rotation    Consulted and Agree with Plan of Care  Patient;Family member/caregiver    Family Member Consulted  wife       Patient  will benefit from skilled therapeutic intervention in order to improve the following deficits and impairments:  Abnormal gait, Decreased endurance, Impaired sensation, Decreased strength, Decreased knowledge of use of DME, Impaired UE functional use, Decreased balance, Decreased mobility, Impaired flexibility, Postural dysfunction(PT will not directly treat back pain but will monitor closely)  Visit Diagnosis: Unsteadiness on feet  Muscle weakness (generalized)  Other abnormalities of gait and mobility     Problem List Patient Active Problem List   Diagnosis Date Noted  . Chronic pain syndrome   . Chronic bilateral low back pain   . Benign essential HTN   . Tobacco abuse   . Marijuana abuse   . Hyperlipidemia   . History of CVA with residual deficit   . Dysphagia, post-stroke   . ICH (intracerebral hemorrhage) (HCC) - R thalmic/PLIC d/t HTN 24/23/5361  . Insomnia 07/13/2017  . PAD (peripheral artery disease) ([place]) 07/13/2017  . Hyperlipidemia with target LDL less than 70 07/13/2017  . Stroke-like symptom 09/22/2016  . Paresthesia 09/22/2016  . CVA (cerebral vascular accident) ([place]) 09/22/2016  . Cerebrovascular accident (CVA) due to thrombosis of left carotid artery ([place]) 09/23/2015  . Primary snoring 09/23/2015  . Hypersomnia with sleep apnea 09/23/2015  . Obstructive sleep apnea 09/23/2015  . Lacunar infarct, acute ([place]) 08/25/2015  . Sleep apnea 08/25/2015  . Dysarthria   . CVA (cerebral infarction) 08/02/2015  . Acute hyperglycemia 08/02/2015  . Systolic hypertension with cerebrovascular disease 12/27/2014  . Epistaxis 07/28/2012  . HYPERGLYCEMIA 10/02/2010  . NEPHROLITHIASIS 05/23/2009  . BARRETTS ESOPHAGUS 02/20/2009  . Gastroparesis 02/20/2009  . RADICULOPATHY 10/21/2008  . GERD 10/08/2008  . ESOPHAGITIS 08/15/2008  . ESOPHAGEAL STRICTURE 08/15/2008  . GASTRITIS, ACUTE 08/15/2008  . RENAL CYST 08/14/2008  . TOBACCO ABUSE 08/08/2008  . HEMATURIA, MICROSCOPIC,  HX OF 08/08/2008  . PULMONARY NODULE 01/10/2008    Cameron Sprang, PT, MPT [facility] [address] [place], [place], [address] Phone: [phone]   Fax:  [phone] 06/08/18, 12:24 PM  Name: Richard Vaughn MRN: [id number] Date of Birth: 30-Jan-1951

## 2018-06-08 NOTE — Therapy (Signed)
[facility] [address] [place], [place], [address] Phone: [phone]   Fax:  [phone]  Speech Language Pathology Treatment  Patient Details  Name: Richard Vaughn MRN: [id number] Date of Birth: [birth year] Referring Provider: Rosalin Hawking, MD   Encounter Date: 06/08/2018  End of Session - 06/08/18 1256    Visit Number  6    Number of Visits  17    Date for SLP Re-Evaluation  08/04/18    SLP Start Time  1016    SLP Stop Time   1058    SLP Time Calculation (min)  42 min    Activity Tolerance  Patient tolerated treatment well       Past Medical History:  Diagnosis Date  . Barrett's esophagus   . Chronic back pain   . Gastritis    Mild  . Gastroparesis   . GERD (gastroesophageal reflux disease)   . Headache   . Hyperglycemia   . Nephrolithiasis   . Renal cyst   . Stricture and stenosis of esophagus   . Stroke ([place]) 07-2015  . Vision abnormalities     Past Surgical History:  Procedure Laterality Date  . [place] SURGERY  2012  . COLONOSCOPY    . [place] SURGERY  2005, 2010   replaced L4 and L5  . UPPER GASTROINTESTINAL ENDOSCOPY      There were no vitals filed for this visit.         ADULT SLP TREATMENT - 06/08/18 1215      General Information   Behavior/Cognition  Alert;Cooperative;Pleasant mood      Treatment Provided   Treatment provided  Cognitive-Linquistic      Cognitive-Linquistic Treatment   Treatment focused on  Dysarthria    Skilled Treatment  Abdominal breathing facilitated today in isolation and short personally relevant phrases pt generated with usual min to mod A. Pt benefitted from going back to isolation breathing or using abs for "sh" intemitently throughout sessions. When pt did achieve accurate abdmonimal breathing, voice quality improved with minimal horaseness. Over articulation and reduced rate in structued taks with min A      Assessment / Recommendations  / [place] with current plan of care      Progression Toward Goals   Progression toward goals  Progressing toward goals       SLP Education - 06/08/18 1253    Education Details  Abdominal breathing    Person(s) Educated  Patient;Spouse    Methods  Explanation;Demonstration;Verbal cues    Comprehension  Verbalized understanding;Returned demonstration;Need further instruction       SLP Short Term Goals - 06/08/18 1255      SLP SHORT TERM GOAL #1   Title  pt will complete HEP with occasional min A     Time  1    Period  Weeks    Status  On-going      SLP SHORT TERM GOAL #2   Title  pt will demo compensations for dysarthria in mod complex sentence responses 90%     Time  1    Period  Weeks    Status  On-going      SLP SHORT TERM GOAL #3   Title  pt will produce 100% intelligible speech in 8 minutes simple conversation with modified independence (compensations)    Time  1    Period  Weeks    Status  On-going  SLP Long Term Goals - 06/08/18 1256      SLP LONG TERM GOAL #1   Title  pt will complete HEP for dysarthria with rare min A over three consecutive sessions    Time  5    Period  Weeks    Status  On-going      SLP LONG TERM GOAL #2   Title  pt will produce 10 minutes intelligible mod complex conversation using compensations, with rare min A over 3 consecutive sessions    Time  5    Period  Weeks    Status  On-going      SLP LONG TERM GOAL #3   Title  pt will demo speech volume in 10 minutes simple-mod complex conversation, in low 70s dB average over three consecutive sessions    Time  5    Period  Weeks    Status  On-going       Plan - 06/08/18 1253    Clinical Impression Statement  Pt continues to demonstrate dysarthria affecting intelligiblity. Abdominal breathing improves vocal quality and volume, however pt requires min to mod A to coordinate respriration and phonation. Continue skilled ST to maximxize intelligibility across settings     Treatment/Interventions  Oral motor exercises;SLP instruction and feedback;Compensatory strategies;Patient/family education;Environmental controls;Cognitive reorganization;Functional tasks;Cueing hierarchy;Internal/external aids    Potential to Achieve Goals  Good    Consulted and Agree with Plan of Care  Patient       Patient will benefit from skilled therapeutic intervention in order to improve the following deficits and impairments:   Dysarthria and anarthria    Problem List Patient Active Problem List   Diagnosis Date Noted  . Chronic pain syndrome   . Chronic bilateral low back pain   . Benign essential HTN   . Tobacco abuse   . Marijuana abuse   . Hyperlipidemia   . History of CVA with residual deficit   . Dysphagia, post-stroke   . ICH (intracerebral hemorrhage) (HCC) - R thalmic/PLIC d/t HTN 02/63/7858  . Insomnia 07/13/2017  . PAD (peripheral artery disease) ([place]) 07/13/2017  . Hyperlipidemia with target LDL less than 70 07/13/2017  . Stroke-like symptom 09/22/2016  . Paresthesia 09/22/2016  . CVA (cerebral vascular accident) ([place]) 09/22/2016  . Cerebrovascular accident (CVA) due to thrombosis of left carotid artery ([place]) 09/23/2015  . Primary snoring 09/23/2015  . Hypersomnia with sleep apnea 09/23/2015  . Obstructive sleep apnea 09/23/2015  . Lacunar infarct, acute ([place]) 08/25/2015  . Sleep apnea 08/25/2015  . Dysarthria   . CVA (cerebral infarction) 08/02/2015  . Acute hyperglycemia 08/02/2015  . Systolic hypertension with cerebrovascular disease 12/27/2014  . Epistaxis 07/28/2012  . HYPERGLYCEMIA 10/02/2010  . NEPHROLITHIASIS 05/23/2009  . BARRETTS ESOPHAGUS 02/20/2009  . Gastroparesis 02/20/2009  . RADICULOPATHY 10/21/2008  . GERD 10/08/2008  . ESOPHAGITIS 08/15/2008  . ESOPHAGEAL STRICTURE 08/15/2008  . GASTRITIS, ACUTE 08/15/2008  . RENAL CYST 08/14/2008  . TOBACCO ABUSE 08/08/2008  . HEMATURIA, MICROSCOPIC, HX OF 08/08/2008  . PULMONARY NODULE  01/10/2008    Lovvorn, Annye Rusk MS, CCC-SLP 06/08/2018, 12:57 PM  [facility] [address] [place], [place], [address] Phone: [phone]   Fax:  [phone]   Name: COBY SHREWSBERRY MRN: [id number] Date of Birth: 11-12-1951

## 2018-06-08 NOTE — Patient Instructions (Addendum)
Knee to Chest (Flexion)    Pull knee toward chest. Feel stretch in lower back or buttock area. Breathing deeply, Hold __30__ seconds. Repeat with other knee. Repeat _3___ times. Do _2-3___ sessions per day.  http://[email or web address].exer.[email or web address]    PELVIC TILT: Posterior    Tighten abdominals, flatten low back. _10__ reps per set, __2_ sets per day, _5-7__ days per week.   Hold for 5 secs each.    Copyright  VHI. All rights reserved.   Copyright  VHI. All rights reserved.  Hamstring Stretch (Sitting)    Sitting, extend one leg and place hands on same thigh for support. Keeping torso straight, lean forward, sliding hands down leg, until a stretch is felt in back of thigh. Hold __60__ seconds. Repeat with other leg.  Do 2-3 times per day.   Copyright  VHI. All rights reserved.

## 2018-06-12 ENCOUNTER — Ambulatory Visit: Payer: Federal, State, Local not specified - PPO | Admitting: Physical Therapy

## 2018-06-12 ENCOUNTER — Encounter: Payer: Federal, State, Local not specified - PPO | Admitting: Occupational Therapy

## 2018-06-12 ENCOUNTER — Ambulatory Visit: Payer: Federal, State, Local not specified - PPO | Admitting: Speech Pathology

## 2018-06-12 ENCOUNTER — Encounter: Payer: Self-pay | Admitting: Physical Therapy

## 2018-06-12 DIAGNOSIS — R471 Dysarthria and anarthria: Secondary | ICD-10-CM

## 2018-06-12 DIAGNOSIS — R29818 Other symptoms and signs involving the nervous system: Secondary | ICD-10-CM | POA: Diagnosis not present

## 2018-06-12 DIAGNOSIS — R2689 Other abnormalities of gait and mobility: Secondary | ICD-10-CM

## 2018-06-12 DIAGNOSIS — R208 Other disturbances of skin sensation: Secondary | ICD-10-CM | POA: Diagnosis not present

## 2018-06-12 DIAGNOSIS — R2681 Unsteadiness on feet: Secondary | ICD-10-CM | POA: Diagnosis not present

## 2018-06-12 DIAGNOSIS — R270 Ataxia, unspecified: Secondary | ICD-10-CM | POA: Diagnosis not present

## 2018-06-12 DIAGNOSIS — M6281 Muscle weakness (generalized): Secondary | ICD-10-CM

## 2018-06-12 DIAGNOSIS — R293 Abnormal posture: Secondary | ICD-10-CM | POA: Diagnosis not present

## 2018-06-12 NOTE — Therapy (Signed)
[facility] [address] [place], [place], [address] Phone: [phone]   Fax:  [phone]  Speech Language Pathology Treatment  Patient Details  Name: Richard Vaughn MRN: [id number] Date of Birth: 1950/12/10 Referring Provider: Rosalin Hawking, MD   Encounter Date: 06/12/2018  End of Session - 06/12/18 1654    Visit Number  7    Number of Visits  17    Date for SLP Re-Evaluation  08/04/18    SLP Start Time  6144    SLP Stop Time   1445    SLP Time Calculation (min)  40 min    Activity Tolerance  Patient tolerated treatment well       Past Medical History:  Diagnosis Date  . Barrett's esophagus   . Chronic back pain   . Gastritis    Mild  . Gastroparesis   . GERD (gastroesophageal reflux disease)   . Headache   . Hyperglycemia   . Nephrolithiasis   . Renal cyst   . Stricture and stenosis of esophagus   . Stroke ([place]) 07-2015  . Vision abnormalities     Past Surgical History:  Procedure Laterality Date  . [place] SURGERY  2012  . COLONOSCOPY    . [place] SURGERY  2005, 2010   replaced L4 and L5  . UPPER GASTROINTESTINAL ENDOSCOPY      There were no vitals filed for this visit.  Subjective Assessment - 06/12/18 1648    Subjective  Pt demos AB in reverse.    Patient is accompained by:  Family member    Currently in Pain?  Yes    Pain Score  4     Pain Location  Back    Pain Orientation  Lower    Pain Descriptors / Indicators  Aching;Sore    Pain Type  Chronic pain    Pain Onset  More than a month ago            ADULT SLP TREATMENT - 06/12/18 1407      General Information   Behavior/Cognition  Alert;Cooperative;Pleasant mood      Treatment Provided   Treatment provided  Cognitive-Linquistic      Pain Assessment   Pain Assessment  No/denies pain      Cognitive-Linquistic Treatment   Treatment focused on  Dysarthria    Skilled Treatment  Initial mod A required for  abdominal breathing in isolation and short phrases. SLP provided verbal and demonstration cues for AB as well as cues for increased vocal intensity, which had positive impact on vocal quality. AB in phrases and short sentences 80% accuracy with occasional min A. Progressed to longer sentences, with 60% accuracy and mod A. Cues intermittently for overarticulation and secretion management to improve intelligibility.       Assessment / Recommendations / Plan   Plan  Continue with current plan of care      Progression Toward Goals   Progression toward goals  Progressing toward goals       SLP Education - 06/12/18 1654    Education Details  abdominal breathing    Person(s) Educated  Patient;Spouse    Methods  Explanation;Demonstration;Verbal cues    Comprehension  Verbalized understanding;Returned demonstration;Need further instruction       SLP Short Term Goals - 06/12/18 1654      SLP SHORT TERM GOAL #1   Title  pt will complete HEP with occasional min A     Time  1  Period  Weeks    Status  Achieved      SLP SHORT TERM GOAL #2   Title  pt will demo compensations for dysarthria in mod complex sentence responses 90%     Period  Weeks    Status  Not Met      SLP SHORT TERM GOAL #3   Title  pt will produce 100% intelligible speech in 8 minutes simple conversation with modified independence (compensations)    Period  Weeks    Status  Not Met       SLP Long Term Goals - 06/12/18 1657      SLP LONG TERM GOAL #1   Title  pt will complete HEP for dysarthria with rare min A over three consecutive sessions    Time  4    Period  Weeks    Status  On-going      SLP LONG TERM GOAL #2   Title  pt will produce 10 minutes intelligible mod complex conversation using compensations, with rare min A over 3 consecutive sessions    Time  4    Period  Weeks    Status  On-going      SLP LONG TERM GOAL #3   Title  pt will demo speech volume in 10 minutes simple-mod complex conversation, in  low 70s dB average over three consecutive sessions    Time  4    Period  Weeks    Status  On-going       Plan - 06/12/18 1654    Clinical Impression Statement  Pt continues to demonstrate dysarthria affecting intelligiblity. Abdominal breathing improves vocal quality and volume, however pt requires min to mod A to coordinate respriration and phonation. Continue skilled ST to maximxize intelligibility across settings    Speech Therapy Frequency  2x / week    Treatment/Interventions  Oral motor exercises;SLP instruction and feedback;Compensatory strategies;Patient/family education;Environmental controls;Cognitive reorganization;Functional tasks;Cueing hierarchy;Internal/external aids    Potential to Achieve Goals  Good    Consulted and Agree with Plan of Care  Patient       Patient will benefit from skilled therapeutic intervention in order to improve the following deficits and impairments:   Dysarthria and anarthria    Problem List Patient Active Problem List   Diagnosis Date Noted  . Chronic pain syndrome   . Chronic bilateral low back pain   . Benign essential HTN   . Tobacco abuse   . Marijuana abuse   . Hyperlipidemia   . History of CVA with residual deficit   . Dysphagia, post-stroke   . ICH (intracerebral hemorrhage) (HCC) - R thalmic/PLIC d/t HTN 42/70/6237  . Insomnia 07/13/2017  . PAD (peripheral artery disease) ([place]) 07/13/2017  . Hyperlipidemia with target LDL less than 70 07/13/2017  . Stroke-like symptom 09/22/2016  . Paresthesia 09/22/2016  . CVA (cerebral vascular accident) ([place]) 09/22/2016  . Cerebrovascular accident (CVA) due to thrombosis of left carotid artery ([place]) 09/23/2015  . Primary snoring 09/23/2015  . Hypersomnia with sleep apnea 09/23/2015  . Obstructive sleep apnea 09/23/2015  . Lacunar infarct, acute ([place]) 08/25/2015  . Sleep apnea 08/25/2015  . Dysarthria   . CVA (cerebral infarction) 08/02/2015  . Acute hyperglycemia 08/02/2015  .  Systolic hypertension with cerebrovascular disease 12/27/2014  . Epistaxis 07/28/2012  . HYPERGLYCEMIA 10/02/2010  . NEPHROLITHIASIS 05/23/2009  . BARRETTS ESOPHAGUS 02/20/2009  . Gastroparesis 02/20/2009  . RADICULOPATHY 10/21/2008  . GERD 10/08/2008  . ESOPHAGITIS 08/15/2008  . ESOPHAGEAL STRICTURE 08/15/2008  .  GASTRITIS, ACUTE 08/15/2008  . RENAL CYST 08/14/2008  . TOBACCO ABUSE 08/08/2008  . HEMATURIA, MICROSCOPIC, HX OF 08/08/2008  . PULMONARY NODULE 01/10/2008   Deneise Lever, [place], [place] 06/12/2018, 4:58 PM  [facility] [address] [place], [place], [address] Phone: [phone]   Fax:  [phone]   Name: JEARLD HEMP MRN: [id number] Date of Birth: [birth year]

## 2018-06-13 NOTE — Therapy (Signed)
[facility] [address] [place], [place], [address] Phone: [phone]   Fax:  [phone]  Physical Therapy Treatment  Patient Details  Name: Richard Vaughn MRN: [id number] Date of Birth: 1951-10-04 Referring Provider: Dr. Erlinda Hong   Encounter Date: 06/12/2018  PT End of Session - 06/12/18 1321    Visit Number  10 PT visits    Number of Visits  17    Date for PT Re-Evaluation  06/24/18    Authorization Type  [employer]: 75 visits combines-awaiting to see if aquatic therapy is covered    Authorization - Visit Number  18 total visits    Authorization - Number of Visits  75    PT Start Time  1319    PT Stop Time  1400    PT Time Calculation (min)  41 min    Equipment Utilized During Treatment  Gait belt    Activity Tolerance  Patient tolerated treatment well    Behavior During Therapy  WFL for tasks assessed/performed       Past Medical History:  Diagnosis Date  . Barrett's esophagus   . Chronic back pain   . Gastritis    Mild  . Gastroparesis   . GERD (gastroesophageal reflux disease)   . Headache   . Hyperglycemia   . Nephrolithiasis   . Renal cyst   . Stricture and stenosis of esophagus   . Stroke ([place]) 07-2015  . Vision abnormalities     Past Surgical History:  Procedure Laterality Date  . [place] SURGERY  2012  . COLONOSCOPY    . [place] SURGERY  2005, 2010   replaced L4 and L5  . UPPER GASTROINTESTINAL ENDOSCOPY      There were no vitals filed for this visit.  Subjective Assessment - 06/12/18 1319    Subjective  Pt reports continued chronic back pain, no falls.     Patient is accompained by:  Family member    Pertinent History  HTN, chronic back pain, HLD, visual abnormalities, sleep apnea, PAD, hx of L BG CVA    Patient Stated Goals  Walk without RW and get back to water aerobics at the [facility]    Currently in Pain?  Yes    Pain Score  4     Pain Location  Back    Pain  Orientation  Lower    Pain Descriptors / Indicators  Aching;Sore    Pain Type  Chronic pain    Pain Onset  More than a month ago    Pain Frequency  Constant    Aggravating Factors   moving, increased activity    Pain Relieving Factors  meds, rest          [facility] Adult PT Treatment/Exercise - 06/12/18 1322      Ambulation/Gait   Ambulation/Gait  Yes    Ambulation/Gait Assistance  5: Supervision    Ambulation/Gait Assistance Details  with emphasis on reciprocal arm swing at a faster pace set by PTA, supervision provided for safety due to faster pace     Ambulation Distance (Feet)  1000 Feet    Assistive device  None    Gait Pattern  Step-through pattern;Decreased stride length;Decreased dorsiflexion - left;Decreased dorsiflexion - right;Decreased arm swing - right;Decreased arm swing - left    Ambulation Surface  Level;Unlevel;Indoor;Outdoor;Paved;Gravel;Grass      High Level Balance   High Level Balance Activities  Marching forwards;Marching backwards;Tandem walking tandem/toe/heel walking fwd/bwd  High Level Balance Comments  on both red mats with no UE support: 3 laps each with min guard to min assist for balance. cues on posture and weight shifting to assist with balance/ex form.           Balance Exercises - 06/12/18 1353      Balance Exercises: Standing   SLS with Vectors  Foam/compliant surface;Other reps (comment);Limitations      Balance Exercises: Standing   SLS with Vectors Limitations  on rockerboard with 2 tall cones in front: alternating fwd toe taps, then alternating cross toe taps x 10 reps each, min assist with occasional touch to bars for balance. cues on posture and weight shifting to assist with balance recovery;  on airex with 3 cones in semi circle pattern in front of it: one leg in single leg stance on airex with other foot tapping each cone across/back before resting down to airex, 2 sets of 5 reps on each leg in stance; on one inch foam with color dots x  8 around it- to taps to each with other leg in stance on foam x 2 circles each leg. cues needed for weight shifting, min to mod assist for balance with last 2 activities with no UE support.                       PT Short Term Goals - 05/26/18 1056      PT SHORT TERM GOAL #1   Title  Pt will be IND in HEP to improve deficits listed above. TARGET DATE FOR ALL STGS: 05/23/18    Baseline  05/17/18; needed min to min gaurd assist with frequent vc to perfom exs correctly today    Status  Not Met      PT SHORT TERM GOAL #2   Title  Pt will improve gait speed to >/=1.8 ft/sec with LRAD to reduce falls risk.     Baseline  05/17/18; goal met, 3.14 ft/sec with no AD    Status  Achieved      PT SHORT TERM GOAL #3   Title  Pt will amb. 300' with LRAD at MOD I level to improve functional mobility.     Baseline  05/26/2018 Met today. Pt. is indepedent with gait without an AD.    Status  Achieved      PT SHORT TERM GOAL #4   Title  Pt will improve TUG time to </=20 sec. with LRAD to reduce falls risk.     Baseline  05/17/18; goal met 14.2 sec with no AD    Status  Achieved      PT SHORT TERM GOAL #5   Title  Pt will improve BERG score to >/=40/56 to decr. falls risk.     Baseline  05/26/2018 Met today 53/56.     Status  Achieved      PT SHORT TERM GOAL #6   Title  Pt will begin aquatic therapy to improve endurance, strength, and balance.     Baseline  05/17/18; goal met    Status  Achieved        PT Long Term Goals - 06/08/18 1223      PT LONG TERM GOAL #1   Title  Pt will verbalize understanding of CVA risk factors and s/s of CVA to reduce risk of another stroke. TARGET DATE FOR ALL LTGS: 06/20/18    Status  On-going      PT LONG TERM GOAL #2  Title  Pt will improve TUG time to </=13.5 sec. with LRAD to reduce falls risk.     Status  On-going      PT LONG TERM GOAL #3   Title  Pt will improve gait speed to >/=2.34f/sec. with LRAD to safely amb. in the community.     Status   On-going      PT LONG TERM GOAL #4   Title  Pt will improve BERG score to >/=45/56 to decr. falls risk.     Baseline  05/26/2018 Met today 53/56.    Status  Achieved      PT LONG TERM GOAL #5   Title  Pt will amb. 600' over even/uneven terrain with LRAD at MOD I level to improve functional mobility.     Status  On-going      Additional Long Term Goals   Additional Long Term Goals  Yes      PT LONG TERM GOAL #6   Title  Pt will improve FGA to 28/30 in order to indicate decreased fall risk.     Status  New            Plan - 06/12/18 1322    Clinical Impression Statement  Today's skilled session continued to focus on gait and high level balance activities with no issues reported. Pt is making steady progress toward goals and should benefit from continued PT to progress toward unmet goals.     Rehab Potential  Good    Clinical Impairments Affecting Rehab Potential  see above    PT Frequency  2x / week    PT Duration  8 weeks    PT Treatment/Interventions  ADLs/Self Care Home Management;Biofeedback;Canalith Repostioning;Electrical Stimulation;Therapeutic activities;Functional mobility training;Stair training;Gait training;Orthotic Fit/Training;Therapeutic exercise;Manual techniques;Vestibular;Patient/family education;DME Instruction;Neuromuscular re-education;Balance training aquatic therapy    PT Next Visit Plan   high level balance/gait on compliant surfaces, gait using poles to encourage reciprocal arm swing and trunk rotation    Consulted and Agree with Plan of Care  Patient;Family member/caregiver    Family Member Consulted  wife       Patient will benefit from skilled therapeutic intervention in order to improve the following deficits and impairments:  Abnormal gait, Decreased endurance, Impaired sensation, Decreased strength, Decreased knowledge of use of DME, Impaired UE functional use, Decreased balance, Decreased mobility, Impaired flexibility, Postural dysfunction(PT will  not directly treat back pain but will monitor closely)  Visit Diagnosis: Unsteadiness on feet  Muscle weakness (generalized)  Other abnormalities of gait and mobility     Problem List Patient Active Problem List   Diagnosis Date Noted  . Chronic pain syndrome   . Chronic bilateral low back pain   . Benign essential HTN   . Tobacco abuse   . Marijuana abuse   . Hyperlipidemia   . History of CVA with residual deficit   . Dysphagia, post-stroke   . ICH (intracerebral hemorrhage) (HCC) - R thalmic/PLIC d/t HTN 002/40/9735 . Insomnia 07/13/2017  . PAD (peripheral artery disease) ([place] 07/13/2017  . Hyperlipidemia with target LDL less than 70 07/13/2017  . Stroke-like symptom 09/22/2016  . Paresthesia 09/22/2016  . CV[place] (cerebral vascular accident) ([place] 09/22/2016  . Cerebrovascular accident (CVA) due to thrombosis of left carotid artery ([place] 09/23/2015  . Primary snoring 09/23/2015  . Hypersomnia with sleep apnea 09/23/2015  . Obstructive sleep apnea 09/23/2015  . Lacunar infarct, acute ([place] 08/25/2015  . Sleep apnea 08/25/2015  . Dysarthria   . CVA (cerebral infarction)  08/02/2015  . Acute hyperglycemia 08/02/2015  . Systolic hypertension with cerebrovascular disease 12/27/2014  . Epistaxis 07/28/2012  . HYPERGLYCEMIA 10/02/2010  . NEPHROLITHIASIS 05/23/2009  . BARRETTS ESOPHAGUS 02/20/2009  . Gastroparesis 02/20/2009  . RADICULOPATHY 10/21/2008  . GERD 10/08/2008  . ESOPHAGITIS 08/15/2008  . ESOPHAGEAL STRICTURE 08/15/2008  . GASTRITIS, ACUTE 08/15/2008  . RENAL CYST 08/14/2008  . TOBACCO ABUSE 08/08/2008  . HEMATURIA, MICROSCOPIC, HX OF 08/08/2008  . PULMONARY NODULE 01/10/2008    Willow Ora, PTA, [facility] [address] [place], [place] [address] [phone] 06/13/18, 6:51 PM   Name: ARTIST BLOOM MRN: [id number] Date of Birth: October 17, 1951

## 2018-06-15 ENCOUNTER — Ambulatory Visit: Payer: Federal, State, Local not specified - PPO | Attending: Neurology

## 2018-06-15 ENCOUNTER — Encounter: Payer: Federal, State, Local not specified - PPO | Admitting: Occupational Therapy

## 2018-06-15 ENCOUNTER — Ambulatory Visit: Payer: Federal, State, Local not specified - PPO | Admitting: Speech Pathology

## 2018-06-15 DIAGNOSIS — R471 Dysarthria and anarthria: Secondary | ICD-10-CM

## 2018-06-15 DIAGNOSIS — R293 Abnormal posture: Secondary | ICD-10-CM | POA: Diagnosis not present

## 2018-06-15 DIAGNOSIS — M6281 Muscle weakness (generalized): Secondary | ICD-10-CM

## 2018-06-15 DIAGNOSIS — R2681 Unsteadiness on feet: Secondary | ICD-10-CM

## 2018-06-15 DIAGNOSIS — R270 Ataxia, unspecified: Secondary | ICD-10-CM | POA: Insufficient documentation

## 2018-06-15 DIAGNOSIS — R29818 Other symptoms and signs involving the nervous system: Secondary | ICD-10-CM | POA: Diagnosis not present

## 2018-06-15 DIAGNOSIS — R2689 Other abnormalities of gait and mobility: Secondary | ICD-10-CM | POA: Diagnosis not present

## 2018-06-15 DIAGNOSIS — R208 Other disturbances of skin sensation: Secondary | ICD-10-CM | POA: Diagnosis not present

## 2018-06-15 NOTE — Therapy (Signed)
[facility] [address] [place], [place], [address] Phone: [phone]   Fax:  [phone]  Speech Language Pathology Treatment  Patient Details  Name: Richard Vaughn MRN: [id number] Date of Birth: 20-Mar-1951 Referring Provider: Rosalin Hawking, MD   Encounter Date: 06/15/2018  End of Session - 06/15/18 1532    Visit Number  8    Number of Visits  17    Date for SLP Re-Evaluation  08/04/18    SLP Start Time  1328 13 min late    SLP Stop Time   1400    SLP Time Calculation (min)  32 min       Past Medical History:  Diagnosis Date  . Barrett's esophagus   . Chronic back pain   . Gastritis    Mild  . Gastroparesis   . GERD (gastroesophageal reflux disease)   . Headache   . Hyperglycemia   . Nephrolithiasis   . Renal cyst   . Stricture and stenosis of esophagus   . Stroke ([place]) 07-2015  . Vision abnormalities     Past Surgical History:  Procedure Laterality Date  . [place] SURGERY  2012  . COLONOSCOPY    . [place] SURGERY  2005, 2010   replaced L4 and L5  . UPPER GASTROINTESTINAL ENDOSCOPY      There were no vitals filed for this visit.  Subjective Assessment - 06/15/18 1328    Subjective  "I'm running a little late today."    Patient is accompained by:  Family member    Currently in Pain?  Yes    Pain Score  4     Pain Location  Back    Pain Orientation  Lower    Pain Descriptors / Indicators  Aching;Sore    Pain Type  Chronic pain    Pain Onset  More than a month ago    Pain Frequency  Constant            ADULT SLP TREATMENT - 06/15/18 1327      General Information   Behavior/Cognition  Alert;Cooperative;Pleasant mood      Treatment Provided   Treatment provided  Cognitive-Linquistic      Pain Assessment   Pain Assessment  No/denies pain      Cognitive-Linquistic Treatment   Treatment focused on  Dysarthria    Skilled Treatment  Pt told SLP he was having trouble with  motivation for speech HEP; he reports he has not completed since Monday and asking for advice. SLP explained rationale for HEP to improve pt's intelligibility and vocal quality, and suggested pairing with an existing routine (reading the newspaper in the morning and with PT exercises in the afternoon). SLP provided a schedule checklist for HEP for pt to post on his refrigerator, and combined handouts for HEP. Pt demo'd HEP with rare min A initially for AB, fading to SBA. Occasional min A for AB, increased vocal intensity in phrase level tasks.       Assessment / Recommendations / Plan   Plan  Continue with current plan of care      Progression Toward Goals   Progression toward goals  Progressing toward goals       SLP Education - 06/15/18 1533    Education Details  rationale and frequency recommended for HEP    Person(s) Educated  Patient    Methods  Explanation;Handout    Comprehension  Need further instruction;Verbalized understanding  SLP Short Term Goals - 06/15/18 1329      SLP SHORT TERM GOAL #1   Title  pt will complete HEP with occasional min A     Status  Achieved      SLP SHORT TERM GOAL #2   Title  pt will demo compensations for dysarthria in mod complex sentence responses 90%     Status  Not Met      SLP SHORT TERM GOAL #3   Title  pt will produce 100% intelligible speech in 8 minutes simple conversation with modified independence (compensations)    Status  Not Met       SLP Long Term Goals - 06/15/18 1329      SLP LONG TERM GOAL #1   Title  pt will complete HEP for dysarthria with rare min A over three consecutive sessions    Time  4    Period  Weeks    Status  On-going      SLP LONG TERM GOAL #2   Title  pt will produce 10 minutes intelligible mod complex conversation using compensations, with rare min A over 3 consecutive sessions    Time  4    Period  Weeks    Status  On-going      SLP LONG TERM GOAL #3   Title  pt will demo speech volume in 10  minutes simple-mod complex conversation, in low 70s dB average over three consecutive sessions    Time  4    Period  Weeks    Status  On-going       Plan - 06/15/18 1533    Clinical Impression Statement  Pt continues to demonstrate dysarthria affecting intelligiblity. Abdominal breathing improves vocal quality and volume, however pt requires min to mod A to coordinate respriration and phonation. Continue skilled ST to maximxize intelligibility across settings    Speech Therapy Frequency  2x / week    Treatment/Interventions  Oral motor exercises;SLP instruction and feedback;Compensatory strategies;Patient/family education;Environmental controls;Cognitive reorganization;Functional tasks;Cueing hierarchy;Internal/external aids    Potential to Achieve Goals  Good    Consulted and Agree with Plan of Care  Patient       Patient will benefit from skilled therapeutic intervention in order to improve the following deficits and impairments:   Dysarthria and anarthria    Problem List Patient Active Problem List   Diagnosis Date Noted  . Chronic pain syndrome   . Chronic bilateral low back pain   . Benign essential HTN   . Tobacco abuse   . Marijuana abuse   . Hyperlipidemia   . History of CVA with residual deficit   . Dysphagia, post-stroke   . ICH (intracerebral hemorrhage) (HCC) - R thalmic/PLIC d/t HTN 30/94/0768  . Insomnia 07/13/2017  . PAD (peripheral artery disease) ([place]) 07/13/2017  . Hyperlipidemia with target LDL less than 70 07/13/2017  . Stroke-like symptom 09/22/2016  . Paresthesia 09/22/2016  . CVA (cerebral vascular accident) ([facility] [facility]) 09/22/2016  . Cerebrovascular accident (CVA) due to thrombosis of left carotid artery ([facility]) 09/23/2015  . Primary snoring 09/23/2015  . Hypersomnia with sleep apnea 09/23/2015  . Obstructive sleep apnea 09/23/2015  . Lacunar infarct, acute ([place] [facility]) 08/25/2015  . Sleep apnea 08/25/2015  . Dysarthria   . CVA (cerebral infarction) 08/02/2015   . Acute hyperglycemia 08/02/2015  . Systolic hypertension with cerebrovascular disease 12/27/2014  . Epistaxis 07/28/2012  . HYPERGLYCEMIA 10/02/2010  . NEPHROLITHIASIS 05/23/2009  . BARRETTS ESOPHAGUS 02/20/2009  . Gastroparesis 02/20/2009  .  RADICULOPATHY 10/21/2008  . GERD 10/08/2008  . ESOPHAGITIS 08/15/2008  . ESOPHAGEAL STRICTURE 08/15/2008  . GASTRITIS, ACUTE 08/15/2008  . RENAL CYST 08/14/2008  . TOBACCO ABUSE 08/08/2008  . HEMATURIA, MICROSCOPIC, HX OF 08/08/2008  . PULMONARY NODULE 01/10/2008   Deneise Lever, [place], [place] Speech-Language Pathologist  Aliene Altes 06/15/2018, 3:34 PM  [facility] [address] [place], [place], [address] Phone: [phone]   Fax:  [phone]   Name: Richard Vaughn MRN: [id number] Date of Birth: 1951-05-23

## 2018-06-15 NOTE — Patient Instructions (Signed)
  1. Thumb on belly button, hand on abdomen   2. Suck in like you are zipping up jeans 5x   3. Say SH when you are sucking in - 5x   4. Say HEY when you are sucking in - 5X   5. Practice short phrases with sucking in while saying the phrase   Practice "[place]" sucking abs in 5x Practice "HEY" sucking abs in   Now breathe in with belly out then say "HEY YOU" with belly in    Use a big breath to power your voice   Practice abdominal breathing before your exercises - hand on belly with thumb on belly button, watch hand/belly move out when you breathe in. Your belly should move in when you breath out.   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   Put Coco out   [place] wants in   We need bait   I'm going to [place]   We catch drum   SLOW AND BIG - EXAGGERATE YOUR MOUTH, MAKE EACH CONSONANT   Speech exercises - do 5x each, x2 day SLOW BIG   SAY THE FOLLOWING- make every sound! Red leather, yellow leather    Big grocery buggy                   Purple baby carriage                     Proper copper coffee pot Ripe purple cabbage Three free throws [place] [employer], Blue Bulb Flash Message  An Chief Financial Officer for [employer] Six Thick Thistles Stick Double Bubble Gum Freshly Fried Fat Fish Cinnamon aluminum linoleum Black bugs blood Lovely lemon linament Tying Tape Takes Time A Shifty Salt Shaker         Shirts shrink, shells shouldn't Call the cat "Buttercup" Yellow oil ointment Unique [place] A Three Toed Tree Toad Knapsack Strap Snap Rubber Baby Buggy Bumpers

## 2018-06-15 NOTE — Therapy (Signed)
[facility] [address] [place], [place], [address] Phone: [phone]   Fax:  [phone]  Physical Therapy Treatment  Patient Details  Name: Richard Vaughn MRN: [id number] Date of Birth: 1951-08-22 Referring Provider: Dr. Erlinda Hong   Encounter Date: 06/15/2018  PT End of Session - 06/15/18 1533    Visit Number  11 PT visits    Number of Visits  17    Date for PT Re-Evaluation  06/24/18    Authorization Type  Federal [employer]: 75 visits combines-awaiting to see if aquatic therapy is covered    Authorization - Visit Number  19 total visits    Authorization - Number of Visits  53    PT Start Time  5053    PT Stop Time  1527    PT Time Calculation (min)  39 min    Equipment Utilized During Treatment  -- min guard to S prn    Activity Tolerance  Patient tolerated treatment well    Behavior During Therapy  [facility] for tasks assessed/performed       Past Medical History:  Diagnosis Date  . Barrett's esophagus   . Chronic back pain   . Gastritis    Mild  . Gastroparesis   . GERD (gastroesophageal reflux disease)   . Headache   . Hyperglycemia   . Nephrolithiasis   . Renal cyst   . Stricture and stenosis of esophagus   . Stroke ([place]) 07-2015  . Vision abnormalities     Past Surgical History:  Procedure Laterality Date  . [place] SURGERY  2012  . COLONOSCOPY    . [place] SURGERY  2005, 2010   replaced L4 and L5  . UPPER GASTROINTESTINAL ENDOSCOPY      There were no vitals filed for this visit.  Subjective Assessment - 06/15/18 1455    Subjective  Pt denied falls or changes since last visit.     Patient is accompained by:  Family member    Pertinent History  HTN, chronic back pain, HLD, visual abnormalities, sleep apnea, PAD, hx of Vaughn BG CVA    Patient Stated Goals  Walk without RW and get back to water aerobics at the [facility]    Currently in Pain?  Yes    Pain Score  -- 3-4/10    Pain Location  Back     Pain Orientation  Lower    Pain Descriptors / Indicators  Aching    Pain Type  Chronic pain    Pain Radiating Towards  BLEs    Pain Onset  More than a month ago    Aggravating Factors   moving, incr. activity    Pain Relieving Factors  meds, rest, aquatic therapy         [facility] PT Assessment - 06/15/18 1500      Functional Gait  Assessment   Gait assessed   Yes    Gait Level Surface  Walks 20 ft in less than 5.5 sec, no assistive devices, good speed, no evidence for imbalance, normal gait pattern, deviates no more than 6 in outside of the 12 in walkway width.    Change in Gait Speed  Able to smoothly change walking speed without loss of balance or gait deviation. Deviate no more than 6 in outside of the 12 in walkway width.    Gait with Horizontal Head Turns  Performs head turns smoothly with no change in gait. Deviates no more than 6 in outside  12 in walkway width    Gait with Vertical Head Turns  Performs head turns with no change in gait. Deviates no more than 6 in outside 12 in walkway width.    Gait and Pivot Turn  Pivot turns safely within 3 sec and stops quickly with no loss of balance.    Step Over Obstacle  Is able to step over 2 stacked shoe boxes taped together (9 in total height) without changing gait speed. No evidence of imbalance.    Gait with Narrow Base of Support  Ambulates 4-7 steps. 6 steps    Gait with Eyes Closed  Walks 20 ft, no assistive devices, good speed, no evidence of imbalance, normal gait pattern, deviates no more than 6 in outside 12 in walkway width. Ambulates 20 ft in less than 7 sec.    Ambulating Backwards  Walks 20 ft, no assistive devices, good speed, no evidence for imbalance, normal gait    Steps  Alternating feet, no rail.    Total Score  28    FGA comment:  28/30: low falls risk.                    [facility] Adult PT Treatment/Exercise - 06/15/18 1500      Ambulation/Gait   Ambulation/Gait  Yes    Ambulation/Gait Assistance  5:  Supervision;6: Modified independent (Device/Increase time)    Ambulation/Gait Assistance Details  MOD I level over even terrain and S over unever paved surfaces (cues to improve heel strike).     Ambulation Distance (Feet)  615 Feet    Assistive device  None    Gait Pattern  Step-through pattern;Decreased stride length;Decreased dorsiflexion - left;Decreased dorsiflexion - right;Decreased arm swing - right;Decreased arm swing - left    Ambulation Surface  Level;Unlevel;Indoor;Outdoor;Paved;Grass    Gait velocity  4.76f/sec. no AD      Timed Up and Go Test   TUG  Normal TUG    Normal TUG (seconds)  9.89 no AD             PT Education - 06/15/18 1458    Education Details  Pt able to verbalize CVA s/s and risk factors to acheive goal. PT discussed goal progress and outcome measure results. PT reiterated the importance of performing HEP (2-3/day).    Person(s) Educated  Patient;Spouse    Methods  Explanation    Comprehension  Verbalized understanding       PT Short Term Goals - 05/26/18 1056      PT SHORT TERM GOAL #1   Title  Pt will be IND in HEP to improve deficits listed above. TARGET DATE FOR ALL STGS: 05/23/18    Baseline  05/17/18; needed min to min gaurd assist with frequent vc to perfom exs correctly today    Status  Not Met      PT SHORT TERM GOAL #2   Title  Pt will improve gait speed to >/=1.8 ft/sec with LRAD to reduce falls risk.     Baseline  05/17/18; goal met, 3.14 ft/sec with no AD    Status  Achieved      PT SHORT TERM GOAL #3   Title  Pt will amb. 300' with LRAD at MOD I level to improve functional mobility.     Baseline  05/26/2018 Met today. Pt. is indepedent with gait without an AD.    Status  Achieved      PT SHORT TERM GOAL #4   Title  Pt  will improve TUG time to </=20 sec. with LRAD to reduce falls risk.     Baseline  05/17/18; goal met 14.2 sec with no AD    Status  Achieved      PT SHORT TERM GOAL #5   Title  Pt will improve BERG score to  >/=40/56 to decr. falls risk.     Baseline  05/26/2018 Met today 53/56.     Status  Achieved      PT SHORT TERM GOAL #6   Title  Pt will begin aquatic therapy to improve endurance, strength, and balance.     Baseline  05/17/18; goal met    Status  Achieved        PT Long Term Goals - 06/15/18 1557      PT LONG TERM GOAL #1   Title  Pt will verbalize understanding of CVA risk factors and s/s of CVA to reduce risk of another stroke. TARGET DATE FOR ALL LTGS: 06/20/18    Baseline  All unmet goals will be carried over to new POC: 06/30/18    Status  Achieved      PT LONG TERM GOAL #2   Title  Pt will improve TUG time to </=13.5 sec. with LRAD to reduce falls risk.     Status  Achieved      PT LONG TERM GOAL #3   Title  Pt will improve gait speed to >/=2.26f/sec. with LRAD to safely amb. in the community.     Status  Achieved      PT LONG TERM GOAL #4   Title  Pt will improve BERG score to >/=45/56 to decr. falls risk.     Baseline  05/26/2018 Met today 53/56.    Status  Achieved      PT LONG TERM GOAL #5   Title  Pt will amb. 600' over even/uneven terrain with LRAD at MOD I level to improve functional mobility.     Status  Partially Met      PT LONG TERM GOAL #6   Title  Pt will improve FGA to 30/30 in order to indicate decreased fall risk.     Baseline  28/30 on 06/15/18    Status  Revised            Plan - 06/15/18 1554    Clinical Impression Statement  Pt demonstrated progress as he met LTGs 1, 2, 3, and 6. Pt partially met LTG 5, as he still has difficulty traversing grassy/uneven terrain. PT will also revise LTG 6 to 30/30 based on great progress. Pt would continue to benefit from skilled PT to improve safety during functional mobility.     Rehab Potential  Good    Clinical Impairments Affecting Rehab Potential  see above    PT Frequency  2x / week    PT Duration  2 weeks    PT Treatment/Interventions  ADLs/Self Care Home Management;Biofeedback;Canalith  Repostioning;Electrical Stimulation;Therapeutic activities;Functional mobility training;Stair training;Gait training;Orthotic Fit/Training;Therapeutic exercise;Manual techniques;Vestibular;Patient/family education;DME Instruction;Neuromuscular re-education;Balance training aquatic therapy    PT Next Visit Plan  Have pt schedule add'Vaughn one week of PT. Right reaction/speed/agility activities. high level balance/gait on compliant surfaces, gait using poles to encourage reciprocal arm swing and trunk rotation    Consulted and Agree with Plan of Care  Patient;Family member/caregiver    Family Member Consulted  wife       Patient will benefit from skilled therapeutic intervention in order to improve the following deficits and impairments:  Abnormal  gait, Decreased endurance, Impaired sensation, Decreased strength, Decreased knowledge of use of DME, Impaired UE functional use, Decreased balance, Decreased mobility, Impaired flexibility, Postural dysfunction(PT will not directly treat back pain but will monitor closely)  Visit Diagnosis: Other abnormalities of gait and mobility - Plan: PT plan of care cert/re-cert  Muscle weakness (generalized) - Plan: PT plan of care cert/re-cert  Unsteadiness on feet - Plan: PT plan of care cert/re-cert  Other disturbances of skin sensation - Plan: PT plan of care cert/re-cert  Other symptoms and signs involving the nervous system - Plan: PT plan of care cert/re-cert     Problem List Patient Active Problem List   Diagnosis Date Noted  . Chronic pain syndrome   . Chronic bilateral low back pain   . Benign essential HTN   . Tobacco abuse   . Marijuana abuse   . Hyperlipidemia   . History of CVA with residual deficit   . Dysphagia, post-stroke   . ICH (intracerebral hemorrhage) (HCC) - R thalmic/PLIC d/t HTN 32/25/6720  . Insomnia 07/13/2017  . PAD (peripheral artery disease) ([place]) 07/13/2017  . Hyperlipidemia with target LDL less than 70 07/13/2017  .  Stroke-like symptom 09/22/2016  . Paresthesia 09/22/2016  . CVA (cerebral vascular accident) ([place]) 09/22/2016  . Cerebrovascular accident (CVA) due to thrombosis of left carotid artery ([place]) 09/23/2015  . Primary snoring 09/23/2015  . Hypersomnia with sleep apnea 09/23/2015  . Obstructive sleep apnea 09/23/2015  . Lacunar infarct, acute ([place]) 08/25/2015  . Sleep apnea 08/25/2015  . Dysarthria   . CVA (cerebral infarction) 08/02/2015  . Acute hyperglycemia 08/02/2015  . Systolic hypertension with cerebrovascular disease 12/27/2014  . Epistaxis 07/28/2012  . HYPERGLYCEMIA 10/02/2010  . NEPHROLITHIASIS 05/23/2009  . BARRETTS ESOPHAGUS 02/20/2009  . Gastroparesis 02/20/2009  . RADICULOPATHY 10/21/2008  . GERD 10/08/2008  . ESOPHAGITIS 08/15/2008  . ESOPHAGEAL STRICTURE 08/15/2008  . GASTRITIS, ACUTE 08/15/2008  . RENAL CYST 08/14/2008  . TOBACCO ABUSE 08/08/2008  . HEMATURIA, MICROSCOPIC, HX OF 08/08/2008  . PULMONARY NODULE 01/10/2008    Richard Vaughn 06/15/2018, 4:03 PM  [facility] [address] [place], [place], [address] Phone: [phone]   Fax:  [phone]  Name: Richard Vaughn MRN: [id number] Date of Birth: 25-Sep-1951  Geoffry Paradise, PT,DPT 06/15/18 4:03 PM Phone: [phone] Fax: [phone]

## 2018-06-19 ENCOUNTER — Ambulatory Visit: Payer: Federal, State, Local not specified - PPO | Admitting: Speech Pathology

## 2018-06-19 ENCOUNTER — Encounter: Payer: Federal, State, Local not specified - PPO | Admitting: Occupational Therapy

## 2018-06-19 ENCOUNTER — Ambulatory Visit: Payer: Federal, State, Local not specified - PPO | Admitting: Rehabilitation

## 2018-06-19 ENCOUNTER — Encounter: Payer: Self-pay | Admitting: Rehabilitation

## 2018-06-19 DIAGNOSIS — R208 Other disturbances of skin sensation: Secondary | ICD-10-CM | POA: Diagnosis not present

## 2018-06-19 DIAGNOSIS — R471 Dysarthria and anarthria: Secondary | ICD-10-CM | POA: Diagnosis not present

## 2018-06-19 DIAGNOSIS — R293 Abnormal posture: Secondary | ICD-10-CM | POA: Diagnosis not present

## 2018-06-19 DIAGNOSIS — R270 Ataxia, unspecified: Secondary | ICD-10-CM | POA: Diagnosis not present

## 2018-06-19 DIAGNOSIS — R2681 Unsteadiness on feet: Secondary | ICD-10-CM | POA: Diagnosis not present

## 2018-06-19 DIAGNOSIS — M6281 Muscle weakness (generalized): Secondary | ICD-10-CM

## 2018-06-19 DIAGNOSIS — R2689 Other abnormalities of gait and mobility: Secondary | ICD-10-CM | POA: Diagnosis not present

## 2018-06-19 DIAGNOSIS — R29818 Other symptoms and signs involving the nervous system: Secondary | ICD-10-CM | POA: Diagnosis not present

## 2018-06-19 NOTE — Therapy (Signed)
[facility] [address] [place], [place], [address] Phone: [phone]   Fax:  [phone]  Speech Language Pathology Treatment  Patient Details  Name: Richard Vaughn MRN: [id number] Date of Birth: Jul 21, 1951 Referring Provider: Rosalin Hawking, MD   Encounter Date: 06/19/2018  End of Session - 06/19/18 1100    Visit Number  9    Number of Visits  17    Date for SLP Re-Evaluation  08/04/18    SLP Start Time  53    SLP Stop Time   1100    SLP Time Calculation (min)  42 min    Activity Tolerance  Patient tolerated treatment well       Past Medical History:  Diagnosis Date  . Barrett's esophagus   . Chronic back pain   . Gastritis    Mild  . Gastroparesis   . GERD (gastroesophageal reflux disease)   . Headache   . Hyperglycemia   . Nephrolithiasis   . Renal cyst   . Stricture and stenosis of esophagus   . Stroke ([place]) 07-2015  . Vision abnormalities     Past Surgical History:  Procedure Laterality Date  . [place] SURGERY  2012  . COLONOSCOPY    . [place] SURGERY  2005, 2010   replaced L4 and L5  . UPPER GASTROINTESTINAL ENDOSCOPY      There were no vitals filed for this visit.  Subjective Assessment - 06/19/18 1018    Subjective  "I'm still resistant."    Patient is accompained by:  Family member    Currently in Pain?  Yes    Pain Score  5     Pain Location  Back    Pain Orientation  Lower    Pain Descriptors / Indicators  Aching    Pain Type  Chronic pain    Pain Onset  More than a month ago            ADULT SLP TREATMENT - 06/19/18 1018      General Information   Behavior/Cognition  Alert;Cooperative;Pleasant mood      Treatment Provided   Treatment provided  Cognitive-Linquistic      Pain Assessment   Pain Assessment  No/denies pain      Cognitive-Linquistic Treatment   Treatment focused on  Dysarthria    Skilled Treatment  Pt reported completing HEP 2 times since  last therapy appointment on Thursday. SLP educated re: need to increase frequency of practice in order to see functional gains. He required frequent cuing (min-mod A) for vocal intensity, SLOP strategies, coordination of respiration/phonation and breath pacing in simple responses, less so in structured phrase and sentence level tasks.      Assessment / Recommendations / Plan   Plan  Continue with current plan of care      Progression Toward Goals   Progression toward goals  Not progressing toward goals (comment) insufficient participation outside of [place]       SLP Education - 06/19/18 1059    Education Details  need to complete HEP in order to make progress    Person(s) Educated  Patient    Methods  Explanation    Comprehension  Verbalized understanding;Need further instruction       SLP Short Term Goals - 06/19/18 1020      SLP SHORT TERM GOAL #1   Title  pt will complete HEP with occasional min A     Status  Achieved  SLP SHORT TERM GOAL #2   Title  pt will demo compensations for dysarthria in mod complex sentence responses 90%     Status  Not Met      SLP SHORT TERM GOAL #3   Title  pt will produce 100% intelligible speech in 8 minutes simple conversation with modified independence (compensations)    Status  Not Met       SLP Long Term Goals - 06/19/18 1020      SLP LONG TERM GOAL #1   Title  Pt will report completing HEP 4/5 days.     Time  3    Period  Weeks    Status  On-going      SLP LONG TERM GOAL #2   Title  pt will produce 10 minutes intelligible mod complex conversation using compensations, with rare min A over 3 consecutive sessions    Time  3    Period  Weeks    Status  On-going      SLP LONG TERM GOAL #3   Title  pt will demo speech volume in 10 minutes simple-mod complex conversation, in low 70s dB average over three consecutive sessions    Time  3    Period  Weeks    Status  On-going       Plan - 06/19/18 1100    Clinical Impression  Statement  Pt continues to demonstrate dysarthria affecting intelligiblity. Abdominal breathing improves vocal quality and volume, however pt requires min to mod A to coordinate respriration and phonation. Pt continues to require cuing; completion of HEP outside of [facility] has been inconsistent. Informed pt today that if participation outside of [place] room does not improve, will decrease frequency/consider d/c. Continue skilled ST to maximize intelligibility across settings    Speech Therapy Frequency  2x / week    Treatment/Interventions  Oral motor exercises;SLP instruction and feedback;Compensatory strategies;Patient/family education;Environmental controls;Cognitive reorganization;Functional tasks;Cueing hierarchy;Internal/external aids    Potential to Achieve Goals  Fair    Potential Considerations  Cooperation/participation level    Consulted and Agree with Plan of Care  Patient;Family member/caregiver       Patient will benefit from skilled therapeutic intervention in order to improve the following deficits and impairments:   Dysarthria and anarthria    Problem List Patient Active Problem List   Diagnosis Date Noted  . Chronic pain syndrome   . Chronic bilateral low back pain   . Benign essential HTN   . Tobacco abuse   . Marijuana abuse   . Hyperlipidemia   . History of CVA with residual deficit   . Dysphagia, post-stroke   . ICH (intracerebral hemorrhage) (HCC) - R thalmic/PLIC d/t HTN 16/08/9603  . Insomnia 07/13/2017  . PAD (peripheral artery disease) ([facility]) 07/13/2017  . Hyperlipidemia with target LDL less than 70 07/13/2017  . Stroke-like symptom 09/22/2016  . Paresthesia 09/22/2016  . CVA (cerebral vascular accident) ([facility]) 09/22/2016  . Cerebrovascular accident (CVA) due to thrombosis of left carotid artery ([place]) 09/23/2015  . Primary snoring 09/23/2015  . Hypersomnia with sleep apnea 09/23/2015  . Obstructive sleep apnea 09/23/2015  . Lacunar infarct, acute ([facility]) 08/25/2015   . Sleep apnea 08/25/2015  . Dysarthria   . CVA (cerebral infarction) 08/02/2015  . Acute hyperglycemia 08/02/2015  . Systolic hypertension with cerebrovascular disease 12/27/2014  . Epistaxis 07/28/2012  . HYPERGLYCEMIA 10/02/2010  . NEPHROLITHIASIS 05/23/2009  . BARRETTS ESOPHAGUS 02/20/2009  . Gastroparesis 02/20/2009  . RADICULOPATHY 10/21/2008  . GERD 10/08/2008  .  ESOPHAGITIS 08/15/2008  . ESOPHAGEAL STRICTURE 08/15/2008  . GASTRITIS, ACUTE 08/15/2008  . RENAL CYST 08/14/2008  . TOBACCO ABUSE 08/08/2008  . HEMATURIA, MICROSCOPIC, HX OF 08/08/2008  . PULMONARY NODULE 01/10/2008   Deneise Lever, [place], [place] Speech-Language Pathologist  Aliene Altes 06/19/2018, 1:21 PM  [facility] [address] [place], [place], [address] Phone: [phone]   Fax:  [phone]   Name: Richard Vaughn MRN: [id number] Date of Birth: 23-Nov-1950

## 2018-06-19 NOTE — Patient Instructions (Signed)
Access Code: [email or web address]  URL: [email or web address]  Date: 05/16/2018  Prepared by: Cameron Sprang   Exercises   Modified [place] - 3 reps - 1 sets - 30 hold - 1x daily - 5x weekly   Supine Hamstring Stretch with Strap - 3 reps - 1 sets - 30 hold - 1x daily - 5x weekly   Narrow Stance with Eyes Closed and Head Rotation on Foam Pad - 10 reps - 3 sets - 1x daily - 5x weekly   Narrow Stance with Eyes Closed and Head Nods on Foam Pad - 10 reps - 3 sets - 1x daily - 5x weekly   Clamshell with Resistance - 10 reps - 3 sets - 2 hold - 1x daily - 3x weekly   Walking March - 20 reps - 2 sets - 1x daily - 7x weekly   Carioca with Counter Support - 4 reps - 1 sets - 1x daily - 5x weekly  Standing to Half-Kneeling - 10 reps - 1 sets - 1x daily - 7x weekly

## 2018-06-19 NOTE — Therapy (Addendum)
[facility] [address] [place], [place], [address] Phone: [phone]   Fax:  [phone]  Physical Therapy Treatment  Patient Details  Name: Richard Vaughn MRN: [id number] Date of Birth: [birth year] Referring Provider: Dr. Erlinda Hong   Encounter Date: 06/19/2018  PT End of Session - 06/19/18 1251    Visit Number  12 PT visits    Number of Visits  17    Date for PT Re-Evaluation  06/24/18    Authorization Type  [employer]: 75 visits combines-awaiting to see if aquatic therapy is covered    Authorization - Visit Number  20 total visits    Authorization - Number of Visits  88    PT Start Time  [phone] pt late to session    PT Stop Time  0932    PT Time Calculation (min)  38 min    Equipment Utilized During Treatment  -- min guard to S prn    Activity Tolerance  Patient tolerated treatment well    Behavior During Therapy  [facility] for tasks assessed/performed       Past Medical History:  Diagnosis Date  . Barrett's esophagus   . Chronic back pain   . Gastritis    Mild  . Gastroparesis   . GERD (gastroesophageal reflux disease)   . Headache   . Hyperglycemia   . Nephrolithiasis   . Renal cyst   . Stricture and stenosis of esophagus   . Stroke ([place]) 07-2015  . Vision abnormalities     Past Surgical History:  Procedure Laterality Date  . [place] SURGERY  2012  . COLONOSCOPY    . [place] SURGERY  2005, 2010   replaced L4 and L5  . UPPER GASTROINTESTINAL ENDOSCOPY      There were no vitals filed for this visit.  Subjective Assessment - 06/19/18 0858    Subjective  No changes since last visit.  Doing well.     Patient is accompained by:  Family member    Pertinent History  HTN, chronic back pain, HLD, visual abnormalities, sleep apnea, PAD, hx of L BG CVA    Patient Stated Goals  Walk without RW and get back to water aerobics at the [facility]    Currently in Pain?  Yes    Pain Score  -- 3-4/10 which is  normal for pt    Pain Location  Back    Pain Orientation  Lower    Pain Descriptors / Indicators  Aching    Pain Type  Chronic pain    Pain Onset  More than a month ago    Pain Frequency  Constant                       [facility] Adult PT Treatment/Exercise - 06/19/18 0900      Ambulation/Gait   Ambulation/Gait  Yes    Ambulation/Gait Assistance  6: Modified independent (Device/Increase time)    Ambulation/Gait Assistance Details  Pt demos improved heel strike today over uneven terrain, uphill and downhill over pavement as well.      Ambulation Distance (Feet)  1100 Feet    Assistive device  None    Gait Pattern  Within Functional Limits    Ambulation Surface  Level;Unlevel;Indoor;Outdoor;Paved;Grass      Neuro Re-ed    Neuro Re-ed Details   Reviewed HEP from [place].  He reports non-compliance, therefore reviewed.  Pt continued to have marked difficulty with corner  balance tasks.  Educated on how these relate to challenges he will have when walking on the beach where ocean and beach meet.  Pt verbalized understanding and this seemed to motivate him to complete these at home.  Wife also verbalized understanding.              PT Education - 06/19/18 0900    Education Details  Educated on importance of HEP, esp how it relates to improved balance on upcoming beach trip.      Person(s) Educated  Patient;Spouse    Methods  Explanation;Demonstration    Comprehension  Verbalized understanding;Returned demonstration       PT Short Term Goals - 05/26/18 1056      PT SHORT TERM GOAL #1   Title  Pt will be IND in HEP to improve deficits listed above. TARGET DATE FOR ALL STGS: 05/23/18    Baseline  05/17/18; needed min to min gaurd assist with frequent vc to perfom exs correctly today    Status  Not Met      PT SHORT TERM GOAL #2   Title  Pt will improve gait speed to >/=1.8 ft/sec with LRAD to reduce falls risk.     Baseline  05/17/18; goal met, 3.14 ft/sec with no AD     Status  Achieved      PT SHORT TERM GOAL #3   Title  Pt will amb. 300' with LRAD at MOD I level to improve functional mobility.     Baseline  05/26/2018 Met today. Pt. is indepedent with gait without an AD.    Status  Achieved      PT SHORT TERM GOAL #4   Title  Pt will improve TUG time to </=20 sec. with LRAD to reduce falls risk.     Baseline  05/17/18; goal met 14.2 sec with no AD    Status  Achieved      PT SHORT TERM GOAL #5   Title  Pt will improve BERG score to >/=40/56 to decr. falls risk.     Baseline  05/26/2018 Met today 53/56.     Status  Achieved      PT SHORT TERM GOAL #6   Title  Pt will begin aquatic therapy to improve endurance, strength, and balance.     Baseline  05/17/18; goal met    Status  Achieved        PT Long Term Goals - 06/19/18 1254      PT LONG TERM GOAL #1   Title  Pt will verbalize understanding of CVA risk factors and s/s of CVA to reduce risk of another stroke. TARGET DATE FOR ALL LTGS: 06/20/18    Baseline  All unmet goals will be carried over to new POC: 06/30/18    Status  Achieved      PT LONG TERM GOAL #2   Title  Pt will improve TUG time to </=13.5 sec. with LRAD to reduce falls risk.     Status  Achieved      PT LONG TERM GOAL #3   Title  Pt will improve gait speed to >/=2.58f/sec. with LRAD to safely amb. in the community.     Status  Achieved      PT LONG TERM GOAL #4   Title  Pt will improve BERG score to >/=45/56 to decr. falls risk.     Baseline  05/26/2018 Met today 53/56.    Status  Achieved      PT LONG  TERM GOAL #5   Title  Pt will amb. 600' over even/uneven terrain with LRAD at MOD I level to improve functional mobility.     Baseline  met 06/19/18    Status  Achieved      PT LONG TERM GOAL #6   Title  Pt will improve FGA to 30/30 in order to indicate decreased fall risk.     Baseline  28/30 on 06/15/18, this is functional and indicates no fall risk    Status  Deferred            Plan - 06/19/18 1252     Clinical Impression Statement  Assessed HEP today as pt reports non-compliance with HEP.  Educated on how these tasks correlate to challenges he will face when going to the beach.  Pt seemed more motivated to complete them.  Also re-assessed outdoor gait with improved foot clearance and heel strike noted, therefore will go ahead and D/C pt.      Rehab Potential  Good    Clinical Impairments Affecting Rehab Potential  see above    PT Frequency  2x / week    PT Duration  2 weeks    PT Treatment/Interventions  ADLs/Self Care Home Management;Biofeedback;Canalith Repostioning;Electrical Stimulation;Therapeutic activities;Functional mobility training;Stair training;Gait training;Orthotic Fit/Training;Therapeutic exercise;Manual techniques;Vestibular;Patient/family education;DME Instruction;Neuromuscular re-education;Balance training aquatic therapy    PT Next Visit Plan  d/c     Consulted and Agree with Plan of Care  Patient;Family member/caregiver    Family Member Consulted  wife       Patient will benefit from skilled therapeutic intervention in order to improve the following deficits and impairments:  Abnormal gait, Decreased endurance, Impaired sensation, Decreased strength, Decreased knowledge of use of DME, Impaired UE functional use, Decreased balance, Decreased mobility, Impaired flexibility, Postural dysfunction(PT will not directly treat back pain but will monitor closely)  Visit Diagnosis: Other abnormalities of gait and mobility  Muscle weakness (generalized)  Unsteadiness on feet    PHYSICAL THERAPY DISCHARGE SUMMARY  Visits from Start of Care: 12  Current functional level related to goals / functional outcomes: See LTGs above   Remaining deficits: Pt with continued high level balance and decreased flexibility causing increased back pain. Has HEP and is continuing to get aquatic therapy to address.    Education / Equipment: HEP  Plan: Patient agrees to discharge.  Patient  goals were met. Patient is being discharged due to meeting the stated rehab goals.  ?????       Problem List Patient Active Problem List   Diagnosis Date Noted  . Chronic pain syndrome   . Chronic bilateral low back pain   . Benign essential HTN   . Tobacco abuse   . Marijuana abuse   . Hyperlipidemia   . History of CVA with residual deficit   . Dysphagia, post-stroke   . ICH (intracerebral hemorrhage) (HCC) - R thalmic/PLIC d/t HTN 15/94/5859  . Insomnia 07/13/2017  . PAD (peripheral artery disease) ([place]) 07/13/2017  . Hyperlipidemia with target LDL less than 70 07/13/2017  . Stroke-like symptom 09/22/2016  . Paresthesia 09/22/2016  . CVA (cerebral vascular accident) ([place]) 09/22/2016  . Cerebrovascular accident (CVA) due to thrombosis of left carotid artery ([place]) 09/23/2015  . Primary snoring 09/23/2015  . Hypersomnia with sleep apnea 09/23/2015  . Obstructive sleep apnea 09/23/2015  . Lacunar infarct, acute ([place]) 08/25/2015  . Sleep apnea 08/25/2015  . Dysarthria   . CVA (cerebral infarction) 08/02/2015  . Acute hyperglycemia 08/02/2015  . Systolic  hypertension with cerebrovascular disease 12/27/2014  . Epistaxis 07/28/2012  . HYPERGLYCEMIA 10/02/2010  . NEPHROLITHIASIS 05/23/2009  . BARRETTS ESOPHAGUS 02/20/2009  . Gastroparesis 02/20/2009  . RADICULOPATHY 10/21/2008  . GERD 10/08/2008  . ESOPHAGITIS 08/15/2008  . ESOPHAGEAL STRICTURE 08/15/2008  . GASTRITIS, ACUTE 08/15/2008  . RENAL CYST 08/14/2008  . TOBACCO ABUSE 08/08/2008  . HEMATURIA, MICROSCOPIC, HX OF 08/08/2008  . PULMONARY NODULE 01/10/2008    Cameron Sprang, PT, MPT [facility] [address] [place], [place], [address] Phone: [phone]   Fax:  [phone] 06/19/18, 12:56 PM  Name: JAKOBEE BRACKINS MRN: [id number] Date of Birth: 1951/07/07

## 2018-06-21 ENCOUNTER — Encounter: Payer: Self-pay | Admitting: Occupational Therapy

## 2018-06-21 ENCOUNTER — Ambulatory Visit: Payer: Federal, State, Local not specified - PPO | Admitting: Occupational Therapy

## 2018-06-21 DIAGNOSIS — R208 Other disturbances of skin sensation: Secondary | ICD-10-CM | POA: Diagnosis not present

## 2018-06-21 DIAGNOSIS — R471 Dysarthria and anarthria: Secondary | ICD-10-CM | POA: Diagnosis not present

## 2018-06-21 DIAGNOSIS — M6281 Muscle weakness (generalized): Secondary | ICD-10-CM

## 2018-06-21 DIAGNOSIS — R29818 Other symptoms and signs involving the nervous system: Secondary | ICD-10-CM

## 2018-06-21 DIAGNOSIS — R293 Abnormal posture: Secondary | ICD-10-CM | POA: Diagnosis not present

## 2018-06-21 DIAGNOSIS — R270 Ataxia, unspecified: Secondary | ICD-10-CM | POA: Diagnosis not present

## 2018-06-21 DIAGNOSIS — R2689 Other abnormalities of gait and mobility: Secondary | ICD-10-CM | POA: Diagnosis not present

## 2018-06-21 DIAGNOSIS — R2681 Unsteadiness on feet: Secondary | ICD-10-CM

## 2018-06-21 NOTE — Therapy (Signed)
[facility] [address] [place], [place], [address] Phone: [phone]   Fax:  [phone]  Occupational Therapy Treatment  Patient Details  Name: Richard Vaughn MRN: [id number] Date of Birth: 07/12/1951 Referring Provider: Dr. Erlinda Hong   Encounter Date: 06/21/2018  OT End of Session - 06/21/18 1713    Visit Number  10    Number of Visits  16    Date for OT Re-Evaluation  07/19/18    Authorization Type  [employer] - 75 visits for all disciplines. Counts as 1 visit if occurs on the same day. Pt approved for aquatic therapy    OT Start Time  1352    OT Stop Time  1443    OT Time Calculation (min)  51 min    Activity Tolerance  Patient tolerated treatment well       Past Medical History:  Diagnosis Date  . Barrett's esophagus   . Chronic back pain   . Gastritis    Mild  . Gastroparesis   . GERD (gastroesophageal reflux disease)   . Headache   . Hyperglycemia   . Nephrolithiasis   . Renal cyst   . Stricture and stenosis of esophagus   . Stroke ([facility]) 07-2015  . Vision abnormalities     Past Surgical History:  Procedure Laterality Date  . [place] SURGERY  2012  . COLONOSCOPY    . [employer] SURGERY  2005, 2010   replaced L4 and L5  . UPPER GASTROINTESTINAL ENDOSCOPY      There were no vitals filed for this visit.  Subjective Assessment - 06/21/18 1706    Patient is accompained by:  Family member wife    Pertinent History  R BG CVA.  Pt has h/o of chronic low back pain prior to stroke    Patient Stated Goals  To walk better and get stronger    Currently in Pain?  Yes    Pain Score  4     Pain Location  Back    Pain Orientation  Lower    Pain Descriptors / Indicators  Aching    Pain Type  Chronic pain    Pain Onset  More than a month ago    Pain Frequency  Constant    Aggravating Factors   moving, increased activity    Pain Relieving Factors  meds, rest, aquatic therapy       Treatment:  Pt seen for  aquatic therapy today - pt entered pool via steps without UE support and completing step over step without LOB.  Treatment occurred in water varying between 3 feet- 4 feet depending on activity.  Session focused on the following:  Increasing overall activity tolerance, LE and UE strengthening, core strengthening, balance and functional mobility.  Pt able to tolerate increased resistance with all activities as well as increased reps without any SOB.  Focused in sitting on chest presses, bicep curls, elbow extension, shoulder depression and shoulder extension  (all with heavier dumb bells and 20 reps x2).  Also addressed LE strengthening for hip flexion/extension, knee flexion/extension using large noodle for resistance in deeper water with increased speed ( 20 reps x2) and through functional ambulation in deep water with increased speed.  Addressed core strength and balance with sitting and standing on noodle - pt able to maintain in sitting mod I today (had previously required min -mod a).  Required only intermittent min a to stand with noodle.  Also addressed core strength and  LE strength/coordination through use of flotation belt and kick board to kick across pool x2 with min a.  Pt with increased control and decreased ataxia with repetition and increased resistance.  Pt tolerated session well.                       OT Short Term Goals - 06/21/18 1708      OT SHORT TERM GOAL #1   Title  Pt will be mod I with HEP for coordination for LUE, balance -goals due  05/23/2018    Status  Achieved      OT SHORT TERM GOAL #2   Title  Pt will be mod I with tying shoes    Status  Achieved      OT SHORT TERM GOAL #3   Title  Pt will demonstrate improved coordination as evidenced by decreasing time on 9 hole peg with LUE by at least 7 seven seconds to assist with functional fine motor tasks.     Status  Achieved 7/116/2019   32.36      OT SHORT TERM GOAL #4   Title  Pt will be mod I with  shaving    Status  Achieved      OT SHORT TERM GOAL #5   Title  Pt will be supervision with shower transfers    Status  Achieved      OT SHORT TERM GOAL #6   Title  Pt will be mod I with simple snack/cold meal prep     Status  Achieved      OT SHORT TERM GOAL #7   Title  Pt will be mod I with loading dishwasher/washing dishes    Status  Achieved        OT Long Term Goals - 06/21/18 1708      OT LONG TERM GOAL #1   Title  Pt will be mod I with upgraded HEP - goals due 06/20/2018    Status  On-going      OT LONG TERM GOAL #2   Title  Pt will be mod I with shower transfers    Status  Achieved      OT LONG TERM GOAL #3   Title  Pt will be mod I with hot meal prep with at least two items.    Status  Achieved      OT LONG TERM GOAL #4   Title  Pt demonstrate improved coordination as evidenced by decreasing time on 9 hole peg by at least 10 seconds with LUE.     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     Baseline  baseline = 29    Status  Achieved 05/30/2018  49 blocks      OT LONG TERM GOAL #7   Title  Pt will return to water fitness class at [place] #8   Title  Pt will be mod I with familiar home mgmt tasks    Status  Achieved            Plan - 06/21/18 1708    Clinical Impression Statement  Pt continues to progress toward goals. Pt reports that as his balance has improved as well as his overall activity tolerance he is now more active in home mgmt tasks, as well as community outings with his wife .    Occupational Profile and client history currently  impacting functional performance  PMH:  L BG CVA, chronic low back pain, HLD, Barrents esophagus, dysphagia, HTN, sleep apnea,     Occupational performance deficits (Please refer to evaluation for details):  ADL's;IADL's;Rest and Sleep;Leisure;Social Participation    Rehab Potential  Good    Current Impairments/barriers affecting progress:  will monitor cognition    OT Frequency  1x / week    OT Duration  4 weeks    OT Treatment/Interventions  Self-care/ADL training;Aquatic Therapy;Moist Heat;Electrical Stimulation;Fluidtherapy;DME and/or AE instruction;Neuromuscular education;Therapeutic exercise;Functional Mobility Training;Manual Therapy;Passive range of motion;Therapeutic activities;Patient/family education;Balance training    Plan  Aquatic therapy to include NMR for balance, motor planning , functional use of LUE, functional ambulation, transitional movements as well as core/extremity strengthening and overall activity tolerance.     Consulted and Agree with Plan of Care  Patient;Family member/caregiver    Family Member Consulted  wife [name]       Patient will benefit from skilled therapeutic intervention in order to improve the following deficits and impairments:  Abnormal gait, Decreased activity tolerance, Decreased balance, Decreased coordination, Decreased mobility, Difficulty walking, Impaired UE functional use, Impaired sensation  Visit Diagnosis: Muscle weakness (generalized) - Plan: Ot plan of care cert/re-cert  Unsteadiness on feet - Plan: Ot plan of care cert/re-cert  Other disturbances of skin sensation - Plan: Ot plan of care cert/re-cert  Other symptoms and signs involving the nervous system - Plan: Ot plan of care cert/re-cert  Abnormal posture - Plan: Ot plan of care cert/re-cert    Problem List Patient Active Problem List   Diagnosis Date Noted  . Chronic pain syndrome   . Chronic bilateral low back pain   . Benign essential HTN   . Tobacco abuse   .  Marijuana abuse   . Hyperlipidemia   . History of CVA with residual deficit   . Dysphagia, post-stroke   . ICH (intracerebral hemorrhage) (HCC) - R thalmic/PLIC d/t HTN 51/76/1607  . Insomnia 07/13/2017  . PAD (peripheral artery disease) ([facility]) 07/13/2017  . Hyperlipidemia with target LDL less than 70 07/13/2017  . Stroke-like symptom 09/22/2016  . Paresthesia 09/22/2016  . CVA (cerebral vascular accident) ([facility]) 09/22/2016  . Cerebrovascular accident (CVA) due to thrombosis of left carotid artery ([facility]) 09/23/2015  . Primary snoring 09/23/2015  . Hypersomnia with sleep apnea 09/23/2015  . Obstructive sleep apnea 09/23/2015  . Lacunar infarct, acute ([facility]) 08/25/2015  . Sleep apnea 08/25/2015  . Dysarthria   . CVA (cerebral infarction) 08/02/2015  . Acute hyperglycemia 08/02/2015  . Systolic hypertension with cerebrovascular disease 12/27/2014  . Epistaxis 07/28/2012  . HYPERGLYCEMIA 10/02/2010  . NEPHROLITHIASIS 05/23/2009  . BARRETTS ESOPHAGUS 02/20/2009  . Gastroparesis 02/20/2009  . RADICULOPATHY 10/21/2008  . GERD 10/08/2008  . ESOPHAGITIS 08/15/2008  . ESOPHAGEAL STRICTURE 08/15/2008  . GASTRITIS, ACUTE 08/15/2008  . RENAL CYST 08/14/2008  . TOBACCO ABUSE 08/08/2008  . HEMATURIA, MICROSCOPIC, HX OF 08/08/2008  . PULMONARY NODULE 01/10/2008    Quay Burow, OTR/L 06/21/2018, 5:18  PM  [facility] [address] [place], [place], [address] Phone: [phone]   Fax:  [phone]  Name: Richard Vaughn MRN: [id number] Date of Birth: October 21, 1951

## 2018-06-22 ENCOUNTER — Ambulatory Visit: Payer: Federal, State, Local not specified - PPO | Admitting: Rehabilitation

## 2018-06-22 ENCOUNTER — Ambulatory Visit: Payer: Federal, State, Local not specified - PPO | Admitting: Speech Pathology

## 2018-06-22 ENCOUNTER — Encounter: Payer: Federal, State, Local not specified - PPO | Admitting: Occupational Therapy

## 2018-06-22 DIAGNOSIS — R471 Dysarthria and anarthria: Secondary | ICD-10-CM | POA: Diagnosis not present

## 2018-06-22 DIAGNOSIS — R29818 Other symptoms and signs involving the nervous system: Secondary | ICD-10-CM | POA: Diagnosis not present

## 2018-06-22 DIAGNOSIS — R293 Abnormal posture: Secondary | ICD-10-CM | POA: Diagnosis not present

## 2018-06-22 DIAGNOSIS — R2681 Unsteadiness on feet: Secondary | ICD-10-CM | POA: Diagnosis not present

## 2018-06-22 DIAGNOSIS — R208 Other disturbances of skin sensation: Secondary | ICD-10-CM | POA: Diagnosis not present

## 2018-06-22 DIAGNOSIS — R270 Ataxia, unspecified: Secondary | ICD-10-CM | POA: Diagnosis not present

## 2018-06-22 DIAGNOSIS — R2689 Other abnormalities of gait and mobility: Secondary | ICD-10-CM | POA: Diagnosis not present

## 2018-06-22 DIAGNOSIS — M6281 Muscle weakness (generalized): Secondary | ICD-10-CM | POA: Diagnosis not present

## 2018-06-22 NOTE — Therapy (Signed)
[facility] [address] [place], [place], [address] Phone: [phone]   Fax:  [phone]  Speech Language Pathology Treatment  Patient Details  Name: Richard Vaughn MRN: [id number] Date of Birth: 1951/06/24 Referring Provider: Rosalin Hawking, MD   Encounter Date: 06/22/2018  End of Session - 06/22/18 1031    Visit Number  10    Number of Visits  17    Date for SLP Re-Evaluation  08/04/18    SLP Start Time  48    SLP Stop Time   1100    SLP Time Calculation (min)  35 min    Activity Tolerance  Patient tolerated treatment well       Past Medical History:  Diagnosis Date  . Barrett's esophagus   . Chronic back pain   . Gastritis    Mild  . Gastroparesis   . GERD (gastroesophageal reflux disease)   . Headache   . Hyperglycemia   . Nephrolithiasis   . Renal cyst   . Stricture and stenosis of esophagus   . Stroke ([facility]) 07-2015  . Vision abnormalities     Past Surgical History:  Procedure Laterality Date  . [place] SURGERY  2012  . COLONOSCOPY    . [facility] SURGERY  2005, 2010   replaced L4 and L5  . UPPER GASTROINTESTINAL ENDOSCOPY      There were no vitals filed for this visit.  Subjective Assessment - 06/22/18 1026    Subjective  "I practiced one time on Tuesday and twice yesterday."    Patient is accompained by:  Family member    Currently in Pain?  Yes    Pain Score  4     Pain Location  Back    Pain Orientation  Lower    Pain Descriptors / Indicators  Aching    Pain Type  Chronic pain            ADULT SLP TREATMENT - 06/22/18 1025      General Information   Behavior/Cognition  Alert;Cooperative;Pleasant mood      Treatment Provided   Treatment provided  Cognitive-Linquistic      Pain Assessment   Pain Assessment  No/denies pain      Cognitive-Linquistic Treatment   Treatment focused on  Dysarthria    Skilled Treatment  Pt reports practicing in 3/5 scheduled  opportunities. Phrase level tasks with abdominal breathing (AB), 85% accuracy, rare min A. Sentence level structured tasks with occasional min A for respiration/phonation coordination, usual min A with increased cognitive load. Requires usual cues for carryover of AB and SLOP strategies in conversation.      Assessment / Recommendations / Plan   Plan  Continue with current plan of care      Progression Toward Goals   Progression toward goals  Progressing toward goals --  slow progress; participation slightly improved this week.        SLP Short Term Goals - 06/22/18 1031      SLP SHORT TERM GOAL #1   Title  pt will complete HEP with occasional min A     Status  Achieved      SLP SHORT TERM GOAL #2   Title  pt will demo compensations for dysarthria in mod complex sentence responses 90%     Status  Not Met      SLP SHORT TERM GOAL #3   Title  pt will produce 100% intelligible speech in 8 minutes simple conversation  with modified independence (compensations)    Status  Not Met       SLP Long Term Goals - 06/22/18 1030      SLP LONG TERM GOAL #1   Title  Pt will report completing HEP 4/5 days.     Time  3    Period  Weeks    Status  On-going      SLP LONG TERM GOAL #2   Time  3    Period  Weeks    Status  On-going      SLP LONG TERM GOAL #3   Title  pt will demo speech volume in 10 minutes simple-mod complex conversation, in low 70s dB average over three consecutive sessions    Time  3    Period  Weeks    Status  On-going       Plan - 06/22/18 1237    Clinical Impression Statement  Pt continues to demonstrate dysarthria affecting intelligiblity. Abdominal breathing improves vocal quality and volume, however pt requires min to mod A to coordinate respriration and phonation. Pt continues to require cuing; completion of HEP outside of ST has been inconsistent. Informed pt today that if participation outside of [facility] room does not improve, will decrease frequency/consider d/c.  Continue skilled ST to maximize intelligibility across settings    Speech Therapy Frequency  2x / week    Treatment/Interventions  Oral motor exercises;SLP instruction and feedback;Compensatory strategies;Patient/family education;Environmental controls;Cognitive reorganization;Functional tasks;Cueing hierarchy;Internal/external aids    Potential to Achieve Goals  Fair    Potential Considerations  Cooperation/participation level    Consulted and Agree with Plan of Care  Patient       Patient will benefit from skilled therapeutic intervention in order to improve the following deficits and impairments:   Dysarthria and anarthria    Problem List Patient Active Problem List   Diagnosis Date Noted  . Chronic pain syndrome   . Chronic bilateral low back pain   . Benign essential HTN   . Tobacco abuse   . Marijuana abuse   . Hyperlipidemia   . History of CVA with residual deficit   . Dysphagia, post-stroke   . ICH (intracerebral hemorrhage) (HCC) - R thalmic/PLIC d/t HTN 09/60/4540  . Insomnia 07/13/2017  . PAD (peripheral artery disease) ([facility]) 07/13/2017  . Hyperlipidemia with target LDL less than 70 07/13/2017  . Stroke-like symptom 09/22/2016  . Paresthesia 09/22/2016  . CVA (cerebral vascular accident) ([facility]) 09/22/2016  . Cerebrovascular accident (CVA) due to thrombosis of left carotid artery ([facility]) 09/23/2015  . Primary snoring 09/23/2015  . Hypersomnia with sleep apnea 09/23/2015  . Obstructive sleep apnea 09/23/2015  . Lacunar infarct, acute ([facility]) 08/25/2015  . Sleep apnea 08/25/2015  . Dysarthria   . CVA (cerebral infarction) 08/02/2015  . Acute hyperglycemia 08/02/2015  . Systolic hypertension with cerebrovascular disease 12/27/2014  . Epistaxis 07/28/2012  . HYPERGLYCEMIA 10/02/2010  . NEPHROLITHIASIS 05/23/2009  . BARRETTS ESOPHAGUS 02/20/2009  . Gastroparesis 02/20/2009  . RADICULOPATHY 10/21/2008  . GERD 10/08/2008  . ESOPHAGITIS 08/15/2008  . ESOPHAGEAL  STRICTURE 08/15/2008  . GASTRITIS, ACUTE 08/15/2008  . RENAL CYST 08/14/2008  . TOBACCO ABUSE 08/08/2008  . HEMATURIA, MICROSCOPIC, HX OF 08/08/2008  . PULMONARY NODULE 01/10/2008   Deneise Lever, [place], [place] 06/22/2018, 12:42 PM  [facility] [address] [place], [place], [address] Phone: [phone]   Fax:  [phone]   Name: Richard Vaughn  MRN: [id number] Date of Birth: [birth year]

## 2018-06-27 ENCOUNTER — Ambulatory Visit: Payer: Federal, State, Local not specified - PPO | Admitting: Speech Pathology

## 2018-06-27 DIAGNOSIS — R29818 Other symptoms and signs involving the nervous system: Secondary | ICD-10-CM | POA: Diagnosis not present

## 2018-06-27 DIAGNOSIS — R471 Dysarthria and anarthria: Secondary | ICD-10-CM

## 2018-06-27 DIAGNOSIS — R2681 Unsteadiness on feet: Secondary | ICD-10-CM | POA: Diagnosis not present

## 2018-06-27 DIAGNOSIS — R270 Ataxia, unspecified: Secondary | ICD-10-CM | POA: Diagnosis not present

## 2018-06-27 DIAGNOSIS — R208 Other disturbances of skin sensation: Secondary | ICD-10-CM | POA: Diagnosis not present

## 2018-06-27 DIAGNOSIS — R293 Abnormal posture: Secondary | ICD-10-CM | POA: Diagnosis not present

## 2018-06-27 DIAGNOSIS — R2689 Other abnormalities of gait and mobility: Secondary | ICD-10-CM | POA: Diagnosis not present

## 2018-06-27 DIAGNOSIS — M6281 Muscle weakness (generalized): Secondary | ICD-10-CM | POA: Diagnosis not present

## 2018-06-27 NOTE — Therapy (Signed)
[facility] [address] [place], [place], [address] Phone: [phone]   Fax:  [phone]  Speech Language Pathology Treatment  Patient Details  Name: Richard Vaughn MRN: [id number] Date of Birth: November 03, 1951 Referring Provider: Rosalin Hawking, MD   Encounter Date: 06/27/2018  End of Session - 06/27/18 1720    Visit Number  11    Number of Visits  17    Date for SLP Re-Evaluation  08/04/18    SLP Start Time  1452    SLP Stop Time   2924    SLP Time Calculation (min)  38 min    Activity Tolerance  Patient tolerated treatment well       Past Medical History:  Diagnosis Date  . Barrett's esophagus   . Chronic back pain   . Gastritis    Mild  . Gastroparesis   . GERD (gastroesophageal reflux disease)   . Headache   . Hyperglycemia   . Nephrolithiasis   . Renal cyst   . Stricture and stenosis of esophagus   . Stroke ([facility]) 07-2015  . Vision abnormalities     Past Surgical History:  Procedure Laterality Date  . [place] SURGERY  2012  . COLONOSCOPY    . [place] SURGERY  2005, 2010   replaced L4 and L5  . UPPER GASTROINTESTINAL ENDOSCOPY      There were no vitals filed for this visit.  Subjective Assessment - 06/27/18 1453    Subjective  "When I don't get much rest it seems slower."    Currently in Pain?  Yes    Pain Score  4     Pain Location  Back    Pain Orientation  Lower    Pain Descriptors / Indicators  Aching    Pain Type  Chronic pain            ADULT SLP TREATMENT - 06/27/18 1453      General Information   Behavior/Cognition  Alert;Cooperative;Pleasant mood      Treatment Provided   Treatment provided  Cognitive-Linquistic      Cognitive-Linquistic Treatment   Treatment focused on  Dysarthria    Skilled Treatment  Pt reports practicing at home 5/5 days, once a day vs 2x per day. Continues to report decreased motivation to work on speech tasks. Vocal intensity and  articulation sub-WNL; pt stimulable for louder, more intelligible speech with initial mod cues and modeling. SLP told pt that carryover has been sub-optimal likely due to decreased participation outside of [facility], and pt agreed. SLP worked with pt on vocal intensity and articulation in conversation today, with occasional min A (visual cues) pt maintained vocal intensity average in low-mid 70s and was >95% intelligible in 10 minutes conversation.       Assessment / Recommendations / Plan   Plan  --   decrease to 1x per week     Progression Toward Goals   Progression toward goals  Progressing toward goals   reduced motivation contributing to slow progress        SLP Short Term Goals - 06/27/18 1456      SLP SHORT TERM GOAL #1   Title  pt will complete HEP with occasional min A     Status  Achieved      SLP SHORT TERM GOAL #2   Title  pt will demo compensations for dysarthria in mod complex sentence responses 90%     Status  Not Met  SLP SHORT TERM GOAL #3   Title  pt will produce 100% intelligible speech in 8 minutes simple conversation with modified independence (compensations)    Status  Not Met       SLP Long Term Goals - 06/27/18 1456      SLP LONG TERM GOAL #1   Title  Pt will report completing HEP 4/5 days.     Baseline  06/27/18    Time  2    Period  Weeks    Status  On-going      SLP LONG TERM GOAL #2   Title  pt will produce 10 minutes intelligible mod complex conversation using compensations, with rare min A over 3 consecutive sessions    Baseline  --    Time  2    Period  Weeks    Status  On-going      SLP LONG TERM GOAL #3   Title  pt will demo speech volume in 10 minutes simple-mod complex conversation, in low 70s dB average over three consecutive sessions    Baseline  --    Time  2    Period  Weeks    Status  On-going       Plan - 06/27/18 1720    Clinical Impression Statement  Pt continues to demonstrate dysarthria affecting intelligiblity.  Abdominal breathing improves vocal quality and volume. Pt continues to require cuing in conversation, occasional min A today; completion of HEP outside of ST has been suboptimal. Pt reported he doesn't always feel like coming to speech therapy. Pt in agreement to decrease frequency to 1x per week. Anticipate d/c in next 1-3 visits. Continue skilled ST to maximize intelligibility across settings.    Speech Therapy Frequency  1x /week    Treatment/Interventions  Oral motor exercises;SLP instruction and feedback;Compensatory strategies;Patient/family education;Environmental controls;Cognitive reorganization;Functional tasks;Cueing hierarchy;Internal/external aids    Potential to Achieve Goals  Fair    Potential Considerations  Cooperation/participation level    Consulted and Agree with Plan of Care  Patient       Patient will benefit from skilled therapeutic intervention in order to improve the following deficits and impairments:   Dysarthria and anarthria    Problem List Patient Active Problem List   Diagnosis Date Noted  . Chronic pain syndrome   . Chronic bilateral low back pain   . Benign essential HTN   . Tobacco abuse   . Marijuana abuse   . Hyperlipidemia   . History of CVA with residual deficit   . Dysphagia, post-stroke   . ICH (intracerebral hemorrhage) (HCC) - R thalmic/PLIC d/t HTN 16/08/9603  . Insomnia 07/13/2017  . PAD (peripheral artery disease) ([facility]) 07/13/2017  . Hyperlipidemia with target LDL less than 70 07/13/2017  . Stroke-like symptom 09/22/2016  . Paresthesia 09/22/2016  . CVA (cerebral vascular accident) ([facility]) 09/22/2016  . Cerebrovascular accident (CVA) due to thrombosis of left carotid artery ([place]) 09/23/2015  . Primary snoring 09/23/2015  . Hypersomnia with sleep apnea 09/23/2015  . Obstructive sleep apnea 09/23/2015  . Lacunar infarct, acute ([facility]) 08/25/2015  . Sleep apnea 08/25/2015  . Dysarthria   . CVA (cerebral infarction) 08/02/2015  . Acute  hyperglycemia 08/02/2015  . Systolic hypertension with cerebrovascular disease 12/27/2014  . Epistaxis 07/28/2012  . HYPERGLYCEMIA 10/02/2010  . NEPHROLITHIASIS 05/23/2009  . BARRETTS ESOPHAGUS 02/20/2009  . Gastroparesis 02/20/2009  . RADICULOPATHY 10/21/2008  . GERD 10/08/2008  . ESOPHAGITIS 08/15/2008  . ESOPHAGEAL STRICTURE 08/15/2008  . GASTRITIS, ACUTE 08/15/2008  .  RENAL CYST 08/14/2008  . TOBACCO ABUSE 08/08/2008  . HEMATURIA, MICROSCOPIC, HX OF 08/08/2008  . PULMONARY NODULE 01/10/2008   Deneise Lever, [place], [place] 06/27/2018, [facility] [address] [place], [place], [address] Phone: [phone]   Fax:  [phone]   Name: Richard Vaughn MRN: [id number] Date of Birth: 06/16/1951

## 2018-06-28 ENCOUNTER — Ambulatory Visit: Payer: Federal, State, Local not specified - PPO | Admitting: Occupational Therapy

## 2018-06-28 ENCOUNTER — Encounter: Payer: Self-pay | Admitting: Occupational Therapy

## 2018-06-28 DIAGNOSIS — R293 Abnormal posture: Secondary | ICD-10-CM

## 2018-06-28 DIAGNOSIS — R29818 Other symptoms and signs involving the nervous system: Secondary | ICD-10-CM

## 2018-06-28 DIAGNOSIS — R2689 Other abnormalities of gait and mobility: Secondary | ICD-10-CM | POA: Diagnosis not present

## 2018-06-28 DIAGNOSIS — R2681 Unsteadiness on feet: Secondary | ICD-10-CM | POA: Diagnosis not present

## 2018-06-28 DIAGNOSIS — R208 Other disturbances of skin sensation: Secondary | ICD-10-CM

## 2018-06-28 DIAGNOSIS — M6281 Muscle weakness (generalized): Secondary | ICD-10-CM

## 2018-06-28 DIAGNOSIS — R270 Ataxia, unspecified: Secondary | ICD-10-CM | POA: Diagnosis not present

## 2018-06-28 DIAGNOSIS — R471 Dysarthria and anarthria: Secondary | ICD-10-CM | POA: Diagnosis not present

## 2018-06-28 NOTE — Therapy (Signed)
[facility] [address] [place], [place], [address] Phone: [phone]   Fax:  [phone]  Occupational Therapy Treatment  Patient Details  Name: Richard Vaughn MRN: [id number] Date of Birth: 05/04/1951 Referring Provider: Dr. Erlinda Hong   Encounter Date: 06/28/2018  OT End of Session - 06/28/18 1804    Visit Number  11    Number of Visits  16    Date for OT Re-Evaluation  07/19/18    Authorization Type  [employer] - 75 visits for all disciplines. Counts as 1 visit if occurs on the same day. Pt approved for aquatic therapy    Authorization - Visit Number  14    Authorization - Number of Visits  39    OT Start Time  4765    OT Stop Time  1430    OT Time Calculation (min)  43 min    Activity Tolerance  Patient tolerated treatment well       Past Medical History:  Diagnosis Date  . Barrett's esophagus   . Chronic back pain   . Gastritis    Mild  . Gastroparesis   . GERD (gastroesophageal reflux disease)   . Headache   . Hyperglycemia   . Nephrolithiasis   . Renal cyst   . Stricture and stenosis of esophagus   . Stroke ([place]) 07-2015  . Vision abnormalities     Past Surgical History:  Procedure Laterality Date  . [place] SURGERY  2012  . COLONOSCOPY    . [place] SURGERY  2005, 2010   replaced L4 and L5  . UPPER GASTROINTESTINAL ENDOSCOPY      There were no vitals filed for this visit.  Subjective Assessment - 06/28/18 1802    Patient is accompained by:  Family member   wife   Pertinent History  R BG CVA.  Pt has h/o of chronic low back pain prior to stroke    Patient Stated Goals  To walk better and get stronger    Currently in Pain?  Yes    Pain Score  3     Pain Location  Back    Pain Orientation  Lower    Pain Descriptors / Indicators  Aching    Pain Type  Chronic pain    Pain Onset  More than a month ago    Pain Frequency  Constant    Aggravating Factors   moving, increased activity    Pain  Relieving Factors  meds, rest, aquatic therapy         Treatment: Pt seen for aquatic therapy today. Pt reported 3/10 back pain initially however by end of session pt with 0/10 low back pain.  Pt entered the water via steps using one railing intermittently for step over step.  Treatment occurred in water between 2.5 feet and 4 feet depending on activity.  Treatment focused on activity tolerance, dynamic standing balance, LLE and LUE control with emphasis on motor sequencing, timing, grading of motor force, coordination and strengthening, as well as core strengthening.  Pt able to jog forward and backward across pool today pacing with therapist x2 laps without SOB.  Pt also able to kick across pool today using flotation belt and kickboard on stomach with min a x2 laps and on back with min a x1 lap.  Pt with some SOB but quickly regained with brief rest. Pt utilized heavy dumb bells for all UE exercises (horizonatal ab/adduction, chest presses, shoulder flexion/extension and shoulder depression  with holding) 15 reps x2. Jumping at pool side with UE's in weight bearing for steadying only 10 reps x2.  Pt with minimal SOB and again recovered with brief rest. Worked in modify quadruped against stairs lifting 1 LE and pushing up and down with remaining extremities and then alternating legs.  Utilized kickboard underwater requiring pt use RLE and then LLE to hold kick board underwater and place and hold at various points, first with UE support then just intermittent UE support. Pt able to exit pool with no UE support and complete step over step.                     OT Short Term Goals - 06/28/18 1802      OT SHORT TERM GOAL #1   Title  Pt will be mod I with HEP for coordination for LUE, balance -goals due  05/23/2018    Status  Achieved      OT SHORT TERM GOAL #2   Title  Pt will be mod I with tying shoes    Status  Achieved      OT SHORT TERM GOAL #3   Title  Pt will demonstrate improved  coordination as evidenced by decreasing time on 9 hole peg with LUE by at least 7 seven seconds to assist with functional fine motor tasks.     Status  Achieved   7/116/2019   32.36     OT SHORT TERM GOAL #4   Title  Pt will be mod I with shaving    Status  Achieved      OT SHORT TERM GOAL #5   Title  Pt will be supervision with shower transfers    Status  Achieved      OT SHORT TERM GOAL #6   Title  Pt will be mod I with simple snack/cold meal prep     Status  Achieved      OT SHORT TERM GOAL #7   Title  Pt will be mod I with loading dishwasher/washing dishes    Status  Achieved        OT Long Term Goals - 06/28/18 1803      OT LONG TERM GOAL #1   Title  Pt will be mod I with upgraded HEP - goals due 06/20/2018    Status  On-going      OT LONG TERM GOAL #2   Title  Pt will be mod I with shower transfers    Status  Achieved      OT LONG TERM GOAL #3   Title  Pt will be mod I with hot meal prep with at least two items.    Status  Achieved      OT LONG TERM GOAL #4   Title  Pt demonstrate improved coordination as evidenced by decreasing time on 9 hole peg by at least 10 seconds with LUE.     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  Baseline  baseline = 29    Status  Achieved   05/30/2018  49 blocks     OT LONG TERM GOAL #7   Title  Pt will return to water fitness class [facility]    Status  On-going      OT LONG TERM GOAL #8   Title  Pt will be mod I with familiar home mgmt tasks    Status  Achieved            Plan - 06/28/18 1803    Clinical Impression Statement  Pt continues to  progress toward goals, demonstrating continued improvement in activity tolerance as well as balance and mobility    Occupational Profile and client history currently impacting functional performance  PMH:  L BG CVA, chronic low back pain, HLD, Barrents esophagus, dysphagia, HTN, sleep apnea,     Occupational performance deficits (Please refer to evaluation for details):  ADL's;IADL's;Rest and Sleep;Leisure;Social Participation    Rehab Potential  Good    Current Impairments/barriers affecting progress:  will monitor cognition    OT Frequency  1x / week    OT Duration  4 weeks    OT Treatment/Interventions  Self-care/ADL training;Aquatic Therapy;Moist Heat;Electrical Stimulation;Fluidtherapy;DME and/or AE instruction;Neuromuscular education;Therapeutic exercise;Functional Mobility Training;Manual Therapy;Passive range of motion;Therapeutic activities;Patient/family education;Balance training    Plan  Aquatic therapy to include NMR for balance, motor planning , functional use of LUE, functional ambulation, transitional movements as well as core/extremity strengthening and overall activity tolerance.     Consulted and Agree with Plan of Care  Patient;Family member/caregiver    Family Member Consulted  wife [name]       Patient will benefit from skilled therapeutic intervention in order to improve the following deficits and impairments:  Abnormal gait, Decreased activity tolerance, Decreased balance, Decreased coordination, Decreased mobility, Difficulty walking, Impaired UE functional use, Impaired sensation  Visit Diagnosis: Muscle weakness (generalized)  Unsteadiness on feet  Other disturbances of skin sensation  Other symptoms and signs involving the nervous system  Abnormal posture  Ataxia    Problem List Patient Active Problem List   Diagnosis Date Noted  . Chronic pain syndrome   . Chronic bilateral low back pain   . Benign essential HTN   . Tobacco abuse   . Marijuana  abuse   . Hyperlipidemia   . History of CVA with residual deficit   . Dysphagia, post-stroke   . ICH (intracerebral hemorrhage) (HCC) - R thalmic/PLIC d/t HTN 70/26/3785  . Insomnia 07/13/2017  . PAD (peripheral artery disease) ([place]) 07/13/2017  . Hyperlipidemia with target LDL less than 70 07/13/2017  . Stroke-like symptom 09/22/2016  . Paresthesia 09/22/2016  . CVA (cerebral vascular accident) ([place]) 09/22/2016  . Cerebrovascular accident (CVA) due to thrombosis of left carotid artery ([place]) 09/23/2015  . Primary snoring 09/23/2015  . Hypersomnia with sleep apnea 09/23/2015  . Obstructive sleep apnea 09/23/2015  . Lacunar infarct, acute ([facility]) 08/25/2015  . Sleep apnea 08/25/2015  . Dysarthria   . CVA (cerebral infarction) 08/02/2015  . Acute hyperglycemia 08/02/2015  . Systolic hypertension with cerebrovascular disease 12/27/2014  . Epistaxis 07/28/2012  . HYPERGLYCEMIA 10/02/2010  . NEPHROLITHIASIS 05/23/2009  . BARRETTS ESOPHAGUS 02/20/2009  . Gastroparesis 02/20/2009  . RADICULOPATHY 10/21/2008  . GERD 10/08/2008  . ESOPHAGITIS 08/15/2008  . ESOPHAGEAL STRICTURE 08/15/2008  . GASTRITIS, ACUTE 08/15/2008  . RENAL CYST 08/14/2008  . TOBACCO ABUSE 08/08/2008  . HEMATURIA, MICROSCOPIC, HX OF 08/08/2008  . PULMONARY NODULE 01/10/2008    Jarrad Mclees,  Estrella Myrtle, OTR/L 06/28/2018, 6:05 PM  [facility] [address] [place], [place], [address] Phone: [phone]   Fax:  [phone]  Name: Richard Vaughn MRN: [id number] Date of Birth: February 19, 1951

## 2018-07-05 ENCOUNTER — Encounter: Payer: Self-pay | Admitting: Occupational Therapy

## 2018-07-05 ENCOUNTER — Ambulatory Visit: Payer: Federal, State, Local not specified - PPO | Admitting: Occupational Therapy

## 2018-07-05 DIAGNOSIS — R270 Ataxia, unspecified: Secondary | ICD-10-CM | POA: Diagnosis not present

## 2018-07-05 DIAGNOSIS — R2681 Unsteadiness on feet: Secondary | ICD-10-CM

## 2018-07-05 DIAGNOSIS — R2689 Other abnormalities of gait and mobility: Secondary | ICD-10-CM | POA: Diagnosis not present

## 2018-07-05 DIAGNOSIS — R208 Other disturbances of skin sensation: Secondary | ICD-10-CM | POA: Diagnosis not present

## 2018-07-05 DIAGNOSIS — R29818 Other symptoms and signs involving the nervous system: Secondary | ICD-10-CM

## 2018-07-05 DIAGNOSIS — M6281 Muscle weakness (generalized): Secondary | ICD-10-CM | POA: Diagnosis not present

## 2018-07-05 DIAGNOSIS — R293 Abnormal posture: Secondary | ICD-10-CM | POA: Diagnosis not present

## 2018-07-05 DIAGNOSIS — R471 Dysarthria and anarthria: Secondary | ICD-10-CM | POA: Diagnosis not present

## 2018-07-05 NOTE — Therapy (Addendum)
[facility] [address] [place], [place], [address] Phone: [phone]   Fax:  [phone]  Occupational Therapy Treatment  Patient Details  Name: Richard Vaughn MRN: [id number] Date of Birth: [birth year] Referring Provider: Dr. Erlinda Hong   Encounter Date: 07/05/2018  OT End of Session - 07/05/18 2033    Visit Number  12    Number of Visits  16    Date for OT Re-Evaluation  07/19/18    Authorization Type  [employer] - 75 visits for all disciplines. Counts as 1 visit if occurs on the same day. Pt approved for aquatic therapy    OT Start Time  1347    OT Stop Time  1429    OT Time Calculation (min)  42 min    Activity Tolerance  Patient tolerated treatment well       Past Medical History:  Diagnosis Date  . Barrett's esophagus   . Chronic back pain   . Gastritis    Mild  . Gastroparesis   . GERD (gastroesophageal reflux disease)   . Headache   . Hyperglycemia   . Nephrolithiasis   . Renal cyst   . Stricture and stenosis of esophagus   . Stroke ([place]) 07-2015  . Vision abnormalities     Past Surgical History:  Procedure Laterality Date  . [place] SURGERY  2012  . COLONOSCOPY    . [place] SURGERY  2005, 2010   replaced L4 and L5  . UPPER GASTROINTESTINAL ENDOSCOPY      There were no vitals filed for this visit.  Subjective Assessment - 07/05/18 2030    Patient is accompained by:  Family member   wife   Pertinent History  R BG CVA.  Pt has h/o of chronic low back pain prior to stroke    Patient Stated Goals  To walk better and get stronger    Currently in Pain?  Yes    Pain Score  3     Pain Location  Back    Pain Orientation  Lower    Pain Descriptors / Indicators  Aching    Pain Type  Chronic pain    Pain Onset  Other (comment)    Pain Frequency  Constant    Aggravating Factors   moving, increased activity    Pain Relieving Factors  meds, rest, aquatic therapy         Treatment: Pt seen for  aquatic therapy today.  Pt entered pool step over step with no UE support today (close supervision).  Treatment took place in water varying between 2 feet- 4 feet depending on activity. Treatment focused on dynamic standing balance, functional ambulation, UE/LE strengthening, LLE coordination including timing/sequencing and force of motor control as well as activity tolerance. Utilized fast jogging with large steps forward and back - pt today able to complete 4 laps without fatiguing. Addressed core strength and balance by working with little to no UE support with LE strengthening (deep water and use of large noodle for resistance) with focus on slow controlled movement. Demand alternated from stability to stable mobility for LE's as well as varying the BOS (normal to narrow to tandem) while engaging UE's with resistive activity using large dumb bells (chest presses, horizontal ab/adduction 15 reps x2 - used greater resistance today and did not use pool wall to support balance). Pt needs mod cues to engage core with all activities and cues for good postural  alignment to decrease ataxic movements.  Resistance in deep water provides increased proprioceptive information for patient with movement in all planes.  Used kicking in prone with noodle under chest as well as float belt and min to mod a - cueing to keep legs in extension and focus on controlled coordinated alternating movement. Pt had significant difficulty with this activity.  Addressed fast forward walking up the ramp moving from deep water to shallow water to increase demand on balance followed by slow controlled backward descent to address dynamic standing balance, postural reactions, and LE coordination.                      OT Short Term Goals - 07/05/18 2031      OT SHORT TERM GOAL #1   Title  Pt will be mod I with HEP for coordination for LUE, balance -goals due  05/23/2018    Status  Achieved      OT SHORT TERM GOAL #2   Title   Pt will be mod I with tying shoes    Status  Achieved      OT SHORT TERM GOAL #3   Title  Pt will demonstrate improved coordination as evidenced by decreasing time on 9 hole peg with LUE by at least 7 seven seconds to assist with functional fine motor tasks.     Status  Achieved   7/116/2019   32.36     OT SHORT TERM GOAL #4   Title  Pt will be mod I with shaving    Status  Achieved      OT SHORT TERM GOAL #5   Title  Pt will be supervision with shower transfers    Status  Achieved      OT SHORT TERM GOAL #6   Title  Pt will be mod I with simple snack/cold meal prep     Status  Achieved      OT SHORT TERM GOAL #7   Title  Pt will be mod I with loading dishwasher/washing dishes    Status  Achieved        OT Long Term Goals - 07/05/18 2031      OT LONG TERM GOAL #1   Title  Pt will be mod I with upgraded HEP - goals due 06/20/2018    Status  On-going      OT LONG TERM GOAL #2   Title  Pt will be mod I with shower transfers    Status  Achieved      OT LONG TERM GOAL #3   Title  Pt will be mod I with hot meal prep with at least two items.    Status  Achieved      OT LONG TERM GOAL #4   Title  Pt demonstrate improved coordination as evidenced by decreasing time on 9 hole peg by at least 10 seconds with LUE.     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     Baseline  baseline = 29    Status  Achieved   05/30/2018  49 blocks     OT LONG TERM GOAL #7   Title  Pt will return to water fitness class at [facility]    Status  On-going      OT LONG TERM GOAL #8   Title  Pt will be mod I with familiar home  mgmt tasks    Status  Achieved            Plan - 07/05/18 2032    Clinical Impression Statement  Pt continues to progress toward goals. Pt with improving functional moblity, high level balance and activity tolerance.     Occupational Profile and client history currently impacting functional performance  PMH:  L BG CVA, chronic low back pain, HLD, Barrents esophagus, dysphagia, HTN, sleep apnea,     Occupational performance deficits (Please refer to evaluation for details):  ADL's;IADL's;Rest and Sleep;Leisure;Social Participation    Rehab Potential  Good    Current Impairments/barriers affecting progress:  will monitor cognition    OT Frequency  1x / week    OT Duration  4 weeks    Consulted and Agree with Plan of Care  Patient;Family member/caregiver    Family Member Consulted  wife [name]       Patient will benefit from skilled therapeutic intervention in order to improve the following deficits and impairments:     Visit Diagnosis: Muscle weakness (generalized)  Unsteadiness on feet  Other disturbances of skin sensation  Other symptoms and signs involving the nervous system  Abnormal posture  Ataxia    Problem List Patient Active Problem List   Diagnosis Date Noted  . Chronic pain syndrome   . Chronic bilateral low back pain   . Benign essential HTN   . Tobacco abuse   . Marijuana abuse   . Hyperlipidemia   . History of CVA with residual deficit   . Dysphagia, post-stroke   . ICH (intracerebral hemorrhage) (HCC) - R thalmic/PLIC d/t HTN 26/33/3545  . Insomnia 07/13/2017  . PAD (peripheral artery disease) ([place]) 07/13/2017  . Hyperlipidemia with target LDL less than 70 07/13/2017  . Stroke-like symptom 09/22/2016  . Paresthesia 09/22/2016  . CVA (cerebral vascular accident) ([place]) 09/22/2016  . Cerebrovascular accident (CVA) due to thrombosis of left carotid artery ([place]) 09/23/2015  . Primary snoring 09/23/2015  . Hypersomnia with sleep apnea 09/23/2015   . Obstructive sleep apnea 09/23/2015  . Lacunar infarct, acute ([place]) 08/25/2015  . Sleep apnea 08/25/2015  . Dysarthria   . CVA (cerebral infarction) 08/02/2015  . Acute hyperglycemia 08/02/2015  . Systolic hypertension with cerebrovascular disease 12/27/2014  . Epistaxis 07/28/2012  . HYPERGLYCEMIA 10/02/2010  . NEPHROLITHIASIS 05/23/2009  . BARRETTS ESOPHAGUS 02/20/2009  . Gastroparesis 02/20/2009  . RADICULOPATHY 10/21/2008  . GERD 10/08/2008  . ESOPHAGITIS 08/15/2008  . ESOPHAGEAL STRICTURE 08/15/2008  . GASTRITIS, ACUTE 08/15/2008  . RENAL CYST 08/14/2008  . TOBACCO ABUSE 08/08/2008  . HEMATURIA, MICROSCOPIC, HX OF 08/08/2008  . PULMONARY NODULE 01/10/2008    Quay Burow, OTR/L 07/05/2018, 8:34 PM  [facility] [address] [place], [place], [address] Phone: [phone]   Fax:  [phone]  Name: Richard Vaughn MRN: [id number] Date of Birth: July 18, 1951

## 2018-07-06 ENCOUNTER — Ambulatory Visit: Payer: Federal, State, Local not specified - PPO | Admitting: Speech Pathology

## 2018-07-06 DIAGNOSIS — R471 Dysarthria and anarthria: Secondary | ICD-10-CM

## 2018-07-06 DIAGNOSIS — R293 Abnormal posture: Secondary | ICD-10-CM | POA: Diagnosis not present

## 2018-07-06 DIAGNOSIS — R2681 Unsteadiness on feet: Secondary | ICD-10-CM | POA: Diagnosis not present

## 2018-07-06 DIAGNOSIS — M6281 Muscle weakness (generalized): Secondary | ICD-10-CM | POA: Diagnosis not present

## 2018-07-06 DIAGNOSIS — R2689 Other abnormalities of gait and mobility: Secondary | ICD-10-CM | POA: Diagnosis not present

## 2018-07-06 DIAGNOSIS — R270 Ataxia, unspecified: Secondary | ICD-10-CM | POA: Diagnosis not present

## 2018-07-06 DIAGNOSIS — R29818 Other symptoms and signs involving the nervous system: Secondary | ICD-10-CM | POA: Diagnosis not present

## 2018-07-06 DIAGNOSIS — R208 Other disturbances of skin sensation: Secondary | ICD-10-CM | POA: Diagnosis not present

## 2018-07-06 NOTE — Therapy (Signed)
[facility] [address] [place], [place], [address] Phone: [phone]   Fax:  [phone]  Speech Language Pathology Treatment  Patient Details  Name: Richard Vaughn MRN: [id number] Date of Birth: [birth year] Referring Provider: Rosalin Hawking, MD   Encounter Date: 07/06/2018  End of Session - 07/06/18 0857    Visit Number  12    Number of Visits  17    Date for SLP Re-Evaluation  08/04/18    SLP Start Time  0852    SLP Stop Time   0930    SLP Time Calculation (min)  38 min    Activity Tolerance  Patient tolerated treatment well       Past Medical History:  Diagnosis Date  . Barrett's esophagus   . Chronic back pain   . Gastritis    Mild  . Gastroparesis   . GERD (gastroesophageal reflux disease)   . Headache   . Hyperglycemia   . Nephrolithiasis   . Renal cyst   . Stricture and stenosis of esophagus   . Stroke ([place]) 07-2015  . Vision abnormalities     Past Surgical History:  Procedure Laterality Date  . [place] SURGERY  2012  . COLONOSCOPY    . [place] SURGERY  2005, 2010   replaced L4 and L5  . UPPER GASTROINTESTINAL ENDOSCOPY      There were no vitals filed for this visit.  Subjective Assessment - 07/06/18 0855    Subjective  "I think I'm doing better. But it comes and goes."    Currently in Pain?  Yes    Pain Score  3     Pain Location  Back    Pain Orientation  Lower    Pain Descriptors / Indicators  Aching    Pain Type  Chronic pain            ADULT SLP TREATMENT - 07/06/18 0848      General Information   Behavior/Cognition  Alert;Cooperative;Pleasant mood      Treatment Provided   Treatment provided  Cognitive-Linquistic      Pain Assessment   Pain Assessment  No/denies pain      Cognitive-Linquistic Treatment   Treatment focused on  Dysarthria    Skilled Treatment  Pt reports practicing once every day. SLP focused session on carryover of intelligibility  strategies and abdominal breathing in conversation. Pt required non-verbal cues occasionally for AB, pt was >95% intelligible in conversation (15 minutes x 2) with repeat requested x1. Maintained WNL vocal intensity >85% of the time.      Assessment / Recommendations / Plan   Plan  Continue with current plan of care      Progression Toward Goals   Progression toward goals  Progressing toward goals       SLP Education - 07/06/18 1129    Education Details  stroke support group    Person(s) Educated  Patient    Methods  Explanation    Comprehension  Verbalized understanding       SLP Short Term Goals - 07/06/18 0858      SLP SHORT TERM GOAL #1   Title  pt will complete HEP with occasional min A     Status  Achieved      SLP SHORT TERM GOAL #2   Title  pt will demo compensations for dysarthria in mod complex sentence responses 90%     Status  Not Met  SLP SHORT TERM GOAL #3   Title  pt will produce 100% intelligible speech in 8 minutes simple conversation with modified independence (compensations)    Status  Not Met       SLP Long Term Goals - 07/06/18 0859      SLP LONG TERM GOAL #1   Title  Pt will report completing HEP 4/5 days.     Status  Achieved      SLP LONG TERM GOAL #2   Title  pt will produce 10 minutes intelligible mod complex conversation using compensations, with rare min A over 3 consecutive sessions    Baseline  06/27/18 07/06/18    Time  1    Period  Weeks    Status  On-going      SLP LONG TERM GOAL #3   Title  pt will demo speech volume in 10 minutes simple-mod complex conversation, in low 70s dB average over three consecutive sessions    Baseline  06/27/18, 07/06/18    Time  1    Period  Weeks    Status  On-going       Plan - 07/06/18 1129    Clinical Impression Statement  Pt continues to demonstrate dysarthria affecting intelligibility. Abdominal breathing improves vocal quality and volume. Pt continues to require cuing in conversation,  occasional min A today; completion of HEP outside of ST has been suboptimal. SLP worked with pt on carryover in conversation today. Anticipate d/c in next 1-2 visits. Continue skilled ST to maximize intelligibility across settings.    Speech Therapy Frequency  1x /week    Treatment/Interventions  Oral motor exercises;SLP instruction and feedback;Compensatory strategies;Patient/family education;Environmental controls;Cognitive reorganization;Functional tasks;Cueing hierarchy;Internal/external aids    Potential to Achieve Goals  Fair    Potential Considerations  Cooperation/participation level    Consulted and Agree with Plan of Care  Patient       Patient will benefit from skilled therapeutic intervention in order to improve the following deficits and impairments:   Dysarthria and anarthria    Problem List Patient Active Problem List   Diagnosis Date Noted  . Chronic pain syndrome   . Chronic bilateral low back pain   . Benign essential HTN   . Tobacco abuse   . Marijuana abuse   . Hyperlipidemia   . History of CVA with residual deficit   . Dysphagia, post-stroke   . ICH (intracerebral hemorrhage) (HCC) - R thalmic/PLIC d/t HTN 47/42/5956  . Insomnia 07/13/2017  . PAD (peripheral artery disease) ([place]) 07/13/2017  . Hyperlipidemia with target LDL less than 70 07/13/2017  . Stroke-like symptom 09/22/2016  . Paresthesia 09/22/2016  . CVA (cerebral vascular accident) ([place]) 09/22/2016  . Cerebrovascular accident (CVA) due to thrombosis of left carotid artery ([place]) 09/23/2015  . Primary snoring 09/23/2015  . Hypersomnia with sleep apnea 09/23/2015  . Obstructive sleep apnea 09/23/2015  . Lacunar infarct, acute ([place]) 08/25/2015  . Sleep apnea 08/25/2015  . Dysarthria   . CVA (cerebral infarction) 08/02/2015  . Acute hyperglycemia 08/02/2015  . Systolic hypertension with cerebrovascular disease 12/27/2014  . Epistaxis 07/28/2012  . HYPERGLYCEMIA 10/02/2010  . NEPHROLITHIASIS  05/23/2009  . BARRETTS ESOPHAGUS 02/20/2009  . Gastroparesis 02/20/2009  . RADICULOPATHY 10/21/2008  . GERD 10/08/2008  . ESOPHAGITIS 08/15/2008  . ESOPHAGEAL STRICTURE 08/15/2008  . GASTRITIS, ACUTE 08/15/2008  . RENAL CYST 08/14/2008  . TOBACCO ABUSE 08/08/2008  . HEMATURIA, MICROSCOPIC, HX OF 08/08/2008  . PULMONARY NODULE 01/10/2008   Deneise Lever, [place], [place]  Speech-Language Pathologist  Aliene Altes 07/06/2018, 11:32 AM  [facility] [address] [place], [place], [address] Phone: [phone]   Fax:  [phone]   Name: Richard Vaughn MRN: [id number] Date of Birth: 04-23-1951

## 2018-07-09 NOTE — Progress Notes (Signed)
HPI:   Richard Vaughn is a 67 y.o. male, who is here today for 2-3 months follow up.   He was last seen on 04/28/18 for hospital follow up after CVA.  Hypertension:   Currently on Benicar 20 mg daily.  S/P CVA with residual dysarthria,ataxia.   His last section of PT is next Wednesday. Last PT section this Wednesday. He is still doing speech therapy. Is no longer using a walker, he is back driving.  He resume Aspirin 325 mg daily.  Home BP readings: 120s/90s.  Last eye exam within a year.  He is taking medications as instructed, no side effects reported.  He has not noted unusual headache, visual changes, exertional chest pain, dyspnea, new focal weakness, or edema.   Lab Results  Component Value Date   CREATININE 1.07 04/17/2018   BUN 11 04/17/2018   NA 139 04/17/2018   K 3.7 04/17/2018   CL 106 04/17/2018   CO2 24 04/17/2018     Insomnia: On Trazodone 50 mg daily. Most of the time his sleep through the night, about 2 times per week he wakes up a few times during the night. He is tolerating medication well. He denies side effects.   Hyperlipidemia:  Currently on fenofibrate. According to patient, he never tried statin medication.  Following a low fat diet: Yes.  He has not noted side effects with medication.  Lab Results  Component Value Date   CHOL 142 05/15/2018   HDL 30 (L) 05/15/2018   LDLCALC 87 05/15/2018   TRIG 127 05/15/2018   CHOLHDL 4.7 05/15/2018    Lab Results  Component Value Date   HGBA1C 5.3 04/15/2018   Hx of chronic pain. He is asking me if he can go back to water exercise after he completes PT.   Review of Systems  Constitutional: Positive for fatigue. Negative for appetite change and fever.  HENT: Negative for nosebleeds, sore throat and trouble swallowing.   Eyes: Negative for redness and visual disturbance.  Respiratory: Negative for cough, shortness of breath and wheezing.   Cardiovascular: Negative for  chest pain, palpitations and leg swelling.  Gastrointestinal: Negative for abdominal pain, nausea and vomiting.  Genitourinary: Negative for decreased urine volume, dysuria and hematuria.  Musculoskeletal: Positive for gait problem.  Neurological: Positive for speech difficulty. Negative for weakness and headaches.  Psychiatric/Behavioral: Positive for sleep disturbance. Negative for confusion.     Current Outpatient Medications on File Prior to Visit  Medication Sig Dispense Refill  . aspirin 81 MG tablet Take 1 tablet (81 mg total) by mouth daily. 30 tablet   . NEXIUM 40 MG capsule TAKE 1 CAPSULE DAILY AT 12 NOON. 90 capsule 3  . nicotine (NICODERM CQ - DOSED IN MG/24 HOURS) 21 mg/24hr patch Place 1 patch (21 mg total) onto the skin daily. 28 patch 1  . olmesartan (BENICAR) 20 MG tablet Take 1 tablet (20 mg total) by mouth daily. 90 tablet 1  . oxymorphone (OPANA) 10 MG tablet Take 10 mg by mouth every 6 (six) hours as needed for pain.     . traZODone (DESYREL) 50 MG tablet Take 1 tablet (50 mg total) by mouth at bedtime. 90 tablet 1   No current facility-administered medications on file prior to visit.      Past Medical History:  Diagnosis Date  . Barrett's esophagus   . Chronic back pain   . Gastritis    Mild  . Gastroparesis   .  GERD (gastroesophageal reflux disease)   . Headache   . Hyperglycemia   . Nephrolithiasis   . Renal cyst   . Stricture and stenosis of esophagus   . Stroke ([place]) 07-2015  . Vision abnormalities    No Known Allergies  Social History   Socioeconomic History  . Marital status: Married    Spouse name: Not on file  . Number of children: 3  . Years of education: Not on file  . Highest education level: Not on file  Occupational History  . Occupation: retired    Fish farm manager: [employer]  Social Needs  . Financial resource strain: Not on file  . Food insecurity:    Worry: Not on file    Inability: Not on file  . Transportation needs:     Medical: Not on file    Non-medical: Not on file  Tobacco Use  . Smoking status: Former Smoker    Packs/day: 0.50    Years: 30.00    Pack years: 15.00    Types: Cigarettes    Last attempt to quit: 04/15/2018    Years since quitting: 0.2  . Smokeless tobacco: Never Used  Substance and Sexual Activity  . Alcohol use: No    Alcohol/week: 0.0 standard drinks  . Drug use: Yes    Types: Oxycodone, Morphine    Comment: mairjuana last use 6 mts ago  . Sexual activity: Yes  Lifestyle  . Physical activity:    Days per week: Not on file    Minutes per session: Not on file  . Stress: Not on file  Relationships  . Social connections:    Talks on phone: Not on file    Gets together: Not on file    Attends religious service: Not on file    Active member of club or organization: Not on file    Attends meetings of clubs or organizations: Not on file    Relationship status: Not on file  Other Topics Concern  . Not on file  Social History Narrative  . Not on file    Vitals:   07/10/18 0933 07/10/18 1010  BP: (!) 148/90 138/90  Pulse: 72   Resp: 12   Temp: 98 F (36.7 C)   SpO2: 98%    Body mass index is 34.79 kg/m.    Physical Exam  Nursing note and vitals reviewed. Constitutional: He is oriented to person, place, and time. He appears well-developed. No distress.  HENT:  Head: Normocephalic and atraumatic.  Mouth/Throat: Oropharynx is clear and moist and mucous membranes are normal.  Edentulous.  Eyes: Pupils are equal, round, and reactive to light. Conjunctivae are normal.  Cardiovascular: Normal rate and regular rhythm.  No murmur heard. Pulses:      Dorsalis pedis pulses are 2+ on the right side, and 2+ on the left side.  Respiratory: Effort normal and breath sounds normal. No respiratory distress.  GI: Soft. He exhibits no mass. There is no hepatomegaly. There is no tenderness.  Musculoskeletal: He exhibits no edema.  Lymphadenopathy:    He has no cervical  adenopathy.  Neurological: He is alert and oriented to person, place, and time. He has normal strength. No cranial nerve deficit.  Mildly unstable gait without assistance. Slurred speech.  Skin: Skin is warm. No rash noted. No erythema.  Psychiatric: He has a normal mood and affect.  Well groomed, good eye contact.      ASSESSMENT AND PLAN:   Richard Vaughn was seen  today for 2-3 months follow-up.  No orders of the defined types were placed in this encounter.   Systolic hypertension with cerebrovascular disease Mildly elevated. After discussion of options, he agrees with adding amlodipine 2.5 mg at bedtime. No changes in Benicar 20 mg daily. Low-salt diet. Eye exam is current. Continue monitoring BP at home. Follow-up in 6 months, before if needed.  Hyperlipidemia with target LDL less than 70 He agrees with trying statin, atorvastatin 20 mg daily. For now we will discontinue fenofibrate. Continue low-fat diet. Follow-up in 5 to 6 months.  Insomnia For now no changes in current management, trazodone 50 mg at bedtime. Good sleep hygiene.   CVD (cardiovascular disease) He has history of thrombotic and hemorrhagic CVA. Also history of PAD. Recommend decreasing Aspirin dose from 325 mg to 81 mg. We discussed side effects of Aspirin. Instructed about warning signs.      Haily Caley G. [place], MD  [facility]. [place] office.

## 2018-07-10 ENCOUNTER — Encounter: Payer: Self-pay | Admitting: Family Medicine

## 2018-07-10 ENCOUNTER — Ambulatory Visit: Payer: Federal, State, Local not specified - PPO | Admitting: Family Medicine

## 2018-07-10 VITALS — BP 138/90 | HR 72 | Temp 98.0°F | Resp 12 | Ht 74.0 in | Wt 271.0 lb

## 2018-07-10 DIAGNOSIS — I2510 Atherosclerotic heart disease of native coronary artery without angina pectoris: Secondary | ICD-10-CM | POA: Diagnosis not present

## 2018-07-10 DIAGNOSIS — G4700 Insomnia, unspecified: Secondary | ICD-10-CM

## 2018-07-10 DIAGNOSIS — I674 Hypertensive encephalopathy: Secondary | ICD-10-CM | POA: Diagnosis not present

## 2018-07-10 DIAGNOSIS — E785 Hyperlipidemia, unspecified: Secondary | ICD-10-CM

## 2018-07-10 MED ORDER — ATORVASTATIN CALCIUM 20 MG PO TABS: 20.0000 mg | ORAL_TABLET | Freq: Every day | ORAL | 2 refills | Status: DC

## 2018-07-10 MED ORDER — AMLODIPINE BESYLATE 2.5 MG PO TABS: 2.5000 mg | ORAL_TABLET | Freq: Every day | ORAL | 1 refills | Status: DC

## 2018-07-10 NOTE — Assessment & Plan Note (Signed)
For now no changes in current management, trazodone 50 mg at bedtime. Good sleep hygiene.

## 2018-07-10 NOTE — Assessment & Plan Note (Signed)
He has history of thrombotic and hemorrhagic CVA. Also history of PAD. Recommend decreasing Aspirin dose from 325 mg to 81 mg. We discussed side effects of Aspirin. Instructed about warning signs.

## 2018-07-10 NOTE — Patient Instructions (Addendum)
A few things to remember from today's visit:   Systolic hypertension with cerebrovascular disease - Plan: amLODipine (NORVASC) 2.5 MG tablet  HYPERGLYCEMIA  Hyperlipidemia with target LDL less than 70 - Plan: atorvastatin (LIPITOR) 20 MG tablet  Amlodipine started. No changes in Benicar. Continue monitoring blood pressure.  Stop Fenofibrate and try Atorvastatin.    Please be sure medication list is accurate. If a new problem present, please set up appointment sooner than planned today.

## 2018-07-10 NOTE — Assessment & Plan Note (Signed)
He agrees with trying statin, atorvastatin 20 mg daily. For now we will discontinue fenofibrate. Continue low-fat diet. Follow-up in 5 to 6 months.

## 2018-07-10 NOTE — Assessment & Plan Note (Signed)
Mildly elevated. After discussion of options, he agrees with adding amlodipine 2.5 mg at bedtime. No changes in Benicar 20 mg daily. Low-salt diet. Eye exam is current. Continue monitoring BP at home. Follow-up in 6 months, before if needed.

## 2018-07-11 ENCOUNTER — Ambulatory Visit: Payer: Federal, State, Local not specified - PPO | Admitting: Speech Pathology

## 2018-07-12 ENCOUNTER — Ambulatory Visit: Payer: Federal, State, Local not specified - PPO | Admitting: Occupational Therapy

## 2018-07-12 ENCOUNTER — Encounter: Payer: Self-pay | Admitting: Occupational Therapy

## 2018-07-12 DIAGNOSIS — R2681 Unsteadiness on feet: Secondary | ICD-10-CM | POA: Diagnosis not present

## 2018-07-12 DIAGNOSIS — R29818 Other symptoms and signs involving the nervous system: Secondary | ICD-10-CM

## 2018-07-12 DIAGNOSIS — R208 Other disturbances of skin sensation: Secondary | ICD-10-CM

## 2018-07-12 DIAGNOSIS — M6281 Muscle weakness (generalized): Secondary | ICD-10-CM

## 2018-07-12 DIAGNOSIS — R270 Ataxia, unspecified: Secondary | ICD-10-CM | POA: Diagnosis not present

## 2018-07-12 DIAGNOSIS — R293 Abnormal posture: Secondary | ICD-10-CM

## 2018-07-12 DIAGNOSIS — R471 Dysarthria and anarthria: Secondary | ICD-10-CM | POA: Diagnosis not present

## 2018-07-12 DIAGNOSIS — R2689 Other abnormalities of gait and mobility: Secondary | ICD-10-CM | POA: Diagnosis not present

## 2018-07-12 NOTE — Therapy (Signed)
[facility] [address] [place], [place], [address] Phone: [phone]   Fax:  [phone]  Occupational Therapy Treatment  Patient Details  Name: Richard Vaughn MRN: [id number] Date of Birth: 01-22-1951 Referring Provider: Dr. Erlinda Hong   Encounter Date: 07/12/2018  OT End of Session - 07/12/18 2026    Visit Number  13    Number of Visits  16    Date for OT Re-Evaluation  07/22/18    Authorization Type  [employer] - 75 visits for all disciplines. Counts as 1 visit if occurs on the same day. Pt approved for aquatic therapy    OT Start Time  1303    OT Stop Time  1345    OT Time Calculation (min)  42 min    Activity Tolerance  Patient tolerated treatment well       Past Medical History:  Diagnosis Date  . Barrett's esophagus   . Chronic back pain   . Gastritis    Mild  . Gastroparesis   . GERD (gastroesophageal reflux disease)   . Headache   . Hyperglycemia   . Nephrolithiasis   . Renal cyst   . Stricture and stenosis of esophagus   . Stroke ([place]) 07-2015  . Vision abnormalities     Past Surgical History:  Procedure Laterality Date  . [place] SURGERY  2012  . COLONOSCOPY    . [place] SURGERY  2005, 2010   replaced L4 and L5  . UPPER GASTROINTESTINAL ENDOSCOPY      There were no vitals filed for this visit.  Subjective Assessment - 07/12/18 2020    Patient is accompained by:  Family member   wife   Pertinent History  R BG CVA.  Pt has h/o of chronic low back pain prior to stroke    Patient Stated Goals  To walk better and get stronger    Currently in Pain?  Yes    Pain Score  3     Pain Location  Back    Pain Orientation  Lower    Pain Descriptors / Indicators  Aching    Pain Type  Chronic pain    Pain Onset  Other (comment)    Pain Frequency  Constant    Aggravating Factors   moving, increased activity    Pain Relieving Factors  meds, rest, aquatic therapy          Treatment:  Pt seen  for aquatic therapy today.  Pt entered the water with no UE support and step over step with supervision and minimal effort.  Treatment performed in water 2.5 - 4 feet depending upon activity.  Treatment focused primarily on core strength, dynamic standing balance, facilitating efficient postural reactions and activity tolerance.  Addressed core strength by working in modified quadruped, push ups off stairs, and yoga side pose alternating sides with emphasis on active core stability using only 2 extremity support.  Addressed activity tolerance with backward jogging at fast rate ( 5 laps across the pool) as well as jumping near pool side with only finger tip UE support.  Jumping and jogging performed in deep water with large fast movements to increase resistance as well as to increase demand on balance and core strength.  Addressed core strength and balance by working in tandem in more shallow water in crouched position and using large dumb bells submerged under water for horizontal abduction with fast movements to display balance and increase demand on core as well  as UE strengthening and control. Pt needed min intermittent steadying assist. Addressed core strength, balance and LE strengthening with standing on 1 LE while moving large noodle with other LE then alternating. Pt able to maintain balance but needed intermittent tactile cues for postural alignment.  Also instructed pt on basic principles of water and how to use those principles to increase or decrease the difficulty of activity.  Pt verbalized understanding. Pt exited the pool via step over step, no UE support with ease.                     OT Short Term Goals - 07/12/18 2021      OT SHORT TERM GOAL #1   Title  Pt will be mod I with HEP for coordination for LUE, balance -goals due  05/23/2018    Status  Achieved      OT SHORT TERM GOAL #2   Title  Pt will be mod I with tying shoes    Status  Achieved      OT SHORT TERM GOAL #3    Title  Pt will demonstrate improved coordination as evidenced by decreasing time on 9 hole peg with LUE by at least 7 seven seconds to assist with functional fine motor tasks.     Status  Achieved   7/116/2019   32.36     OT SHORT TERM GOAL #4   Title  Pt will be mod I with shaving    Status  Achieved      OT SHORT TERM GOAL #5   Title  Pt will be supervision with shower transfers    Status  Achieved      OT SHORT TERM GOAL #6   Title  Pt will be mod I with simple snack/cold meal prep     Status  Achieved      OT SHORT TERM GOAL #7   Title  Pt will be mod I with loading dishwasher/washing dishes    Status  Achieved        OT Long Term Goals - 07/12/18 2022      OT LONG TERM GOAL #1   Title  Pt will be mod I with upgraded HEP - goals due 07/21/2018 (renewed on 06/21/2018)    Status  On-going      OT LONG TERM GOAL #2   Title  Pt will be mod I with shower transfers    Status  Achieved      OT LONG TERM GOAL #3   Title  Pt will be mod I with hot meal prep with at least two items.    Status  Achieved      OT LONG TERM GOAL #4   Title  Pt demonstrate improved coordination as evidenced by decreasing time on 9 hole peg by at least 10 seconds with LUE.     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     Baseline  baseline = 29  Status  Achieved   05/30/2018  49 blocks     OT LONG TERM GOAL #7   Title  Pt will return to water fitness class at [facility]    Status  On-going      OT LONG TERM GOAL #8   Title  Pt will be mod I with familiar home mgmt tasks    Status  Achieved            Plan -  07/12/18 2022    Clinical Impression Statement  Pt progressing toward goals. [facility] his [facility] to do some light activity in the water.     Occupational Profile and client history currently impacting functional performance  PMH:  L BG CVA, chronic low back pain, HLD, Barrents esophagus, dysphagia, HTN, sleep apnea,     Occupational performance deficits (Please refer to evaluation for details):  ADL's;IADL's;Rest and Sleep;Leisure;Social Participation    Rehab Potential  Good    Current Impairments/barriers affecting progress:  will monitor cognition    OT Frequency  1x / week    OT Duration  4 weeks    OT Treatment/Interventions  Self-care/ADL training;Aquatic Therapy;Moist Heat;Electrical Stimulation;Fluidtherapy;DME and/or AE instruction;Neuromuscular education;Therapeutic exercise;Functional Mobility Training;Manual Therapy;Passive range of motion;Therapeutic activities;Patient/family education;Balance training    Plan  Aquatic therapy to include NMR for balance, motor planning , functional use of LUE, functional ambulation, transitional movements as well as core/extremity strengthening and overall activity tolerance.     Consulted and Agree with Plan of Care  Patient;Family member/caregiver    Family Member Consulted  wife [name]       Patient will benefit from skilled therapeutic intervention in order to improve the following deficits and impairments:  Abnormal gait, Decreased activity tolerance, Decreased balance, Decreased coordination, Decreased mobility, Difficulty walking, Impaired UE functional use, Impaired sensation  Visit Diagnosis: Muscle weakness (generalized)  Unsteadiness on feet  Other disturbances of skin sensation  Other symptoms and signs involving the nervous system  Abnormal posture  Ataxia    Problem List Patient Active Problem List   Diagnosis Date Noted  . Chronic pain syndrome   . Chronic bilateral low back pain    . Benign essential HTN   . Tobacco abuse   . Marijuana abuse   . Hyperlipidemia   . History of CVA with residual deficit   . Dysphagia, post-stroke   . ICH (intracerebral hemorrhage) (HCC) - R thalmic/PLIC d/t HTN 29/52/8413  . Insomnia 07/13/2017  . PAD (peripheral artery disease) ([place]) 07/13/2017  . Hyperlipidemia with target LDL less than 70 07/13/2017  . Stroke-like symptom 09/22/2016  . Paresthesia 09/22/2016  . CVA (cerebral vascular accident) ([facility]) 09/22/2016  . Cerebrovascular accident (CVA) due to thrombosis of left carotid artery ([place]) 09/23/2015  . Primary snoring 09/23/2015  . Hypersomnia with sleep apnea 09/23/2015  . Obstructive sleep apnea 09/23/2015  . Lacunar infarct, acute ([place]) 08/25/2015  . Sleep apnea 08/25/2015  . Dysarthria   . CVD (cardiovascular disease) 08/02/2015  . Acute hyperglycemia 08/02/2015  . Systolic hypertension with cerebrovascular disease 12/27/2014  . Epistaxis 07/28/2012  . HYPERGLYCEMIA 10/02/2010  . NEPHROLITHIASIS 05/23/2009  . BARRETTS ESOPHAGUS 02/20/2009  . Gastroparesis 02/20/2009  . RADICULOPATHY 10/21/2008  . GERD 10/08/2008  . ESOPHAGITIS 08/15/2008  . ESOPHAGEAL STRICTURE 08/15/2008  . GASTRITIS, ACUTE 08/15/2008  . RENAL CYST 08/14/2008  . TOBACCO ABUSE 08/08/2008  . HEMATURIA, MICROSCOPIC, HX OF 08/08/2008  . PULMONARY NODULE 01/10/2008  Quay Burow, OTR/L 07/12/2018, 8:28 PM  [facility] [address] [place], [place], [address] Phone: [phone]   Fax:  [phone]  Name: ARRIE ZUERCHER MRN: [id number] Date of Birth: 11/08/1951

## 2018-07-13 ENCOUNTER — Encounter: Payer: Federal, State, Local not specified - PPO | Admitting: Speech Pathology

## 2018-07-19 ENCOUNTER — Ambulatory Visit: Payer: Federal, State, Local not specified - PPO | Attending: Neurology | Admitting: Occupational Therapy

## 2018-07-19 ENCOUNTER — Encounter: Payer: Self-pay | Admitting: Occupational Therapy

## 2018-07-19 DIAGNOSIS — R293 Abnormal posture: Secondary | ICD-10-CM | POA: Insufficient documentation

## 2018-07-19 DIAGNOSIS — R270 Ataxia, unspecified: Secondary | ICD-10-CM

## 2018-07-19 DIAGNOSIS — M6281 Muscle weakness (generalized): Secondary | ICD-10-CM | POA: Diagnosis not present

## 2018-07-19 DIAGNOSIS — R2681 Unsteadiness on feet: Secondary | ICD-10-CM

## 2018-07-19 DIAGNOSIS — R29818 Other symptoms and signs involving the nervous system: Secondary | ICD-10-CM | POA: Diagnosis not present

## 2018-07-19 DIAGNOSIS — R208 Other disturbances of skin sensation: Secondary | ICD-10-CM | POA: Diagnosis not present

## 2018-07-19 NOTE — Therapy (Signed)
[facility] [address] [place], [place], [address] Phone: [phone]   Fax:  [phone]  Occupational Therapy Treatment  Patient Details  Name: Richard Vaughn MRN: [id number] Date of Birth: 1951-06-03 Referring Provider: Dr. Erlinda Hong   Encounter Date: 07/19/2018  OT End of Session - 07/19/18 1618    Visit Number  14    Number of Visits  16    Date for OT Re-Evaluation  07/22/18    Authorization Type  [employer] - 75 visits for all disciplines. Counts as 1 visit if occurs on the same day. Pt approved for aquatic therapy    OT Start Time  1348    OT Stop Time  1430    OT Time Calculation (min)  42 min    Activity Tolerance  Patient tolerated treatment well       Past Medical History:  Diagnosis Date  . Barrett's esophagus   . Chronic back pain   . Gastritis    Mild  . Gastroparesis   . GERD (gastroesophageal reflux disease)   . Headache   . Hyperglycemia   . Nephrolithiasis   . Renal cyst   . Stricture and stenosis of esophagus   . Stroke ([place]) 07-2015  . Vision abnormalities     Past Surgical History:  Procedure Laterality Date  . [place] SURGERY  2012  . COLONOSCOPY    . [place] SURGERY  2005, 2010   replaced L4 and L5  . UPPER GASTROINTESTINAL ENDOSCOPY      There were no vitals filed for this visit.  Subjective Assessment - 07/19/18 1614    Patient is accompained by:  Family member   wife   Pertinent History  R BG CVA.  Pt has h/o of chronic low back pain prior to stroke    Patient Stated Goals  To walk better and get stronger    Currently in Pain?  Yes    Pain Score  2     Pain Location  Back    Pain Orientation  Lower    Pain Descriptors / Indicators  Aching    Pain Onset  Other (comment)   several years   Aggravating Factors   moving, increaed activity    Pain Relieving Factors  meds, rest, aquatic therapy        Treatment:  Pt seen for aquatic therapy today - pt treated in water  from 2.5-4 feet deep depending upon activity.  Pt entered the pool today via steps (step over step without UE support) mod I.  Treatment focused on instructing pt in HEP for water activities - pt able to return demonstrate all activities and verbalize understanding . Pt also instructed on basic water principles to allow pt to adjust difficulty of activities or change focus.  Treatment also focused primarily on increasing activity tolerance, coordination and control of LLE, dynamic standing balance, improving efficiency of postural reactions and UE and LE strengthening.  Also addressed core strength through modified yoga poses.  Pt much better able to challenge balance in water due to buoyancy.  Pt also states he has returned to his water fitness class in a modified form (I.e going 2-3 times per week and taking breaks as needed during the class).  Pt exited the pool via steps (step over step) without UE support and able to maintain conversation during task.                   OT  Education - 07/19/18 1615    Education Details  Issued HEP for water based activities  as well as education on basic water principles    Person(s) Educated  Patient;Spouse    Methods  Explanation;Demonstration;Handout    Comprehension  Verbalized understanding;Returned demonstration       OT Short Term Goals - 07/19/18 1616      OT SHORT TERM GOAL #1   Title  Pt will be mod I with HEP for coordination for LUE, balance -goals due  05/23/2018    Status  Achieved      OT SHORT TERM GOAL #2   Title  Pt will be mod I with tying shoes    Status  Achieved      OT SHORT TERM GOAL #3   Title  Pt will demonstrate improved coordination as evidenced by decreasing time on 9 hole peg with LUE by at least 7 seven seconds to assist with functional fine motor tasks.     Status  Achieved   7/116/2019   32.36     OT SHORT TERM GOAL #4   Title  Pt will be mod I with shaving    Status  Achieved      OT SHORT TERM GOAL #5    Title  Pt will be supervision with shower transfers    Status  Achieved      OT SHORT TERM GOAL #6   Title  Pt will be mod I with simple snack/cold meal prep     Status  Achieved      OT SHORT TERM GOAL #7   Title  Pt will be mod I with loading dishwasher/washing dishes    Status  Achieved        OT Long Term Goals - 07/19/18 1617      OT LONG TERM GOAL #1   Title  Pt will be mod I with upgraded HEP - goals due 07/21/2018 (renewed on 06/21/2018)    Status  Achieved      OT LONG TERM GOAL #2   Title  Pt will be mod I with shower transfers    Status  Achieved      OT LONG TERM GOAL #3   Title  Pt will be mod I with hot meal prep with at least two items.    Status  Achieved      OT LONG TERM GOAL #4   Title  Pt demonstrate improved coordination as evidenced by decreasing time on 9 hole peg by at least 10 seconds with LUE.     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     Baseline  baseline = 29    Status  Achieved   05/30/2018  49 blocks     OT LONG TERM GOAL #7   Title  Pt will return to water fitness class at [place] #8   Title  Pt will be mod I with familiar home mgmt tasks    Status  Achieved            Plan - 07/19/18 1617    Clinical Impression Statement  Pt has met all goals and ready for discharge - pt has returned to his water fitness class 2-3 times per week.    Occupational Profile and client history currently impacting functional performance  PMH:  L BG CVA, chronic low back pain, HLD, Barrents esophagus, dysphagia, HTN, sleep apnea,      Occupational performance deficits (Please refer to evaluation for details):  ADL's;IADL's;Rest and Sleep;Leisure;Social Participation    OT Treatment/Interventions  Self-care/ADL training;Aquatic Therapy;Moist Heat;Electrical Stimulation;Fluidtherapy;DME and/or AE instruction;Neuromuscular education;Therapeutic exercise;Functional Mobility Training;Manual Therapy;Passive range of motion;Therapeutic activities;Patient/family education;Balance training    Plan  d/c from OT today    Consulted and Agree with Plan of Care  Patient;Family member/caregiver    Family Member Consulted  wife [name]       Patient will benefit from skilled therapeutic intervention in order to improve the following deficits and impairments:  Abnormal gait, Decreased activity tolerance, Decreased balance, Decreased coordination, Decreased mobility, Difficulty walking, Impaired UE functional use, Impaired sensation  Visit Diagnosis: Muscle weakness (generalized)  Unsteadiness on feet  Other disturbances of skin sensation  Other symptoms and signs involving the nervous system  Abnormal posture  Ataxia    Problem List Patient Active Problem List   Diagnosis Date Noted  . Chronic pain syndrome   . Chronic bilateral low back pain   . Benign essential HTN   . Tobacco abuse   . Marijuana abuse   . Hyperlipidemia   . History of CVA with residual deficit   . Dysphagia, post-stroke   . ICH (intracerebral hemorrhage) (HCC) - R thalmic/PLIC d/t HTN 67/20/9470  . Insomnia 07/13/2017  . PAD (peripheral artery disease) ([facility]) 07/13/2017  . Hyperlipidemia with target LDL less than 70 07/13/2017  . Stroke-like symptom 09/22/2016  . Paresthesia 09/22/2016  . CVA (cerebral vascular accident) ([place]) 09/22/2016  . Cerebrovascular accident (CVA) due to thrombosis of left carotid artery ([facility]) 09/23/2015  . Primary snoring 09/23/2015  . Hypersomnia with sleep apnea 09/23/2015  . Obstructive sleep apnea 09/23/2015  . Lacunar  infarct, acute ([facility]) 08/25/2015  . Sleep apnea 08/25/2015  . Dysarthria   . CVD (cardiovascular disease) 08/02/2015  . Acute hyperglycemia 08/02/2015  . Systolic hypertension with cerebrovascular disease 12/27/2014  . Epistaxis 07/28/2012  . HYPERGLYCEMIA 10/02/2010  . NEPHROLITHIASIS 05/23/2009  . BARRETTS ESOPHAGUS 02/20/2009  . Gastroparesis 02/20/2009  . RADICULOPATHY 10/21/2008  . GERD 10/08/2008  . ESOPHAGITIS 08/15/2008  . ESOPHAGEAL STRICTURE 08/15/2008  . GASTRITIS, ACUTE 08/15/2008  . RENAL CYST 08/14/2008  . TOBACCO ABUSE 08/08/2008  . HEMATURIA, MICROSCOPIC, HX OF 08/08/2008  . PULMONARY NODULE 01/10/2008   OCCUPATIONAL THERAPY DISCHARGE SUMMARY  Visits from Start of Care: 14  Current functional level related to goals / functional outcomes: See above   Remaining deficits: Mild LLE incoordination, decreased activity tolerance, mild high level balance disturbance.   Education / Equipment: HEP  Plan: Patient agrees to discharge.  Patient goals were met. Patient is being discharged due to meeting the stated rehab goals.  ?????      Quay Burow, OTR/L 07/19/2018, 4:19 PM  [facility] [address] [place], [place], [address] Phone: [phone]   Fax:  [phone]  Name: Richard Vaughn MRN: [id number] Date of Birth: November 06, 1951

## 2018-07-20 ENCOUNTER — Ambulatory Visit (INDEPENDENT_AMBULATORY_CARE_PROVIDER_SITE_OTHER): Payer: Federal, State, Local not specified - PPO | Admitting: Neurology

## 2018-07-20 DIAGNOSIS — I10 Essential (primary) hypertension: Secondary | ICD-10-CM

## 2018-07-20 DIAGNOSIS — G4734 Idiopathic sleep related nonobstructive alveolar hypoventilation: Secondary | ICD-10-CM

## 2018-07-20 DIAGNOSIS — R471 Dysarthria and anarthria: Secondary | ICD-10-CM

## 2018-07-20 DIAGNOSIS — G4731 Primary central sleep apnea: Secondary | ICD-10-CM

## 2018-07-20 DIAGNOSIS — I619 Nontraumatic intracerebral hemorrhage, unspecified: Secondary | ICD-10-CM

## 2018-07-20 DIAGNOSIS — Z79891 Long term (current) use of opiate analgesic: Secondary | ICD-10-CM

## 2018-07-24 NOTE — Procedures (Signed)
PATIENT'S NAME:  Donnis, Phaneuf DOB:      [birth year]      MR#:    [id number]     DATE OF RECORDING: 07/20/2018 REFERRING M.D.:  Doe-Hyun Kyra Searles, DO Study Performed:  Split-Night Titration Study Mr. Arnold Kester is 67 year old gentleman who has been treated for strokes at [facility] in 2017, and had followed up with Dr. Shawna Orleans, Dr. Erlinda Hong and Dr. Leonie Man-  he had been referred for a sleep study and a polysomnography had been performed on 05 October 2016, noting Central Sleep Apnea at AHI 18.7/h. no prolonged desaturation, and the non-supine AHI was 3.4/h only. Primary recommendation was to avoid supine sleep.  He suffered yet another stroke on 04-15-2018, less than 14 days before seeing me in a sleep consult. This time haemorrhagic, a right thalamic bleed.   HISTORY:  Ongoing tobacco abuse, Obesity, GERD, stricture and stenosis of esophagus, gastritis, depression, gastroparesis, renal cyst, nephrolithiasis, back pain, headache, strokes, COMPLEX-SA. The patient endorsed the Epworth Sleepiness Scale at 4/24 points, FSS at 24/63 points, Geriatric depression score 8/15 points- positive.   The patient's weight 258 pounds with a height of 74 (inches), resulting in a BMI of 33.1 kg/m2. The patient's neck circumference measured 17.5 inches.  CURRENT MEDICATIONS: Aspirin, Coenzyme, Losartan, Nexium Ondansetron, Oxy and hydro-morphine and Trazodone  PROCEDURE:  This is a multichannel digital polysomnogram utilizing the Somnostar 11.2 system.  Electrodes and sensors were applied and monitored per AASM Specifications.   EEG, EOG, Chin and Limb EMG, were sampled at 200 Hz.  ECG, Snore and Nasal Pressure, Thermal Airflow, Respiratory Effort, CPAP Flow and Pressure, Oximetry was sampled at 50 Hz. Digital video and audio were recorded.      BASELINE STUDY WITHOUT CPAP RESULTS: Lights Out was at 21:48 and Lights On at 05:05.  Total recording time (TRT) was 176.5, with a total sleep time (TST) of 127.5 minutes.   The  patient's sleep latency was 43.5 minutes.  REM latency was 142 minutes.  The sleep efficiency was 72.2 %.    SLEEP ARCHITECTURE: WASO (Wake after sleep onset) was 40 minutes, Stage N1 was 5.5 minutes, Stage N2 was 89 minutes, Stage N3 was 23.5 minutes and Stage R (REM sleep) was 9.5 minutes.  The percentages were Stage N1 4.3%, Stage N2 69.8%, Stage N3 18.4% and Stage R (REM sleep) 7.5%.   RESPIRATORY ANALYSIS:  There were a total of 34 respiratory events:  5 obstructive apneas, 10 central apneas and 6 mixed apneas with 13 hypopneas. The patient also had 0 respiratory event related arousals (RERAs). Snoring was noted. The total APNEA/HYPOPNEA INDEX (AHI) was 16.0 /hour and the total RESPIRATORY DISTURBANCE INDEX was 16.0 /hour. 1 event occurred in REM sleep and 40 events in NREM. The REM AHI was 6.3 /hour versus a non-REM AHI of 16.8 /hour.  The patient spent 197.5 minutes sleep time in the supine position 188 minutes in non-supine. The supine AHI was 101.5 /hour versus a non-supine AHI of 11.4 /hour.  OXYGEN SATURATION & C02:  The wake baseline 02 saturation was 96%, with the lowest being 84%. Time spent below 89% saturation equaled 12 minutes.  AROUSALS/ PERIODIC LIMB MOVEMENTS: The patient had a total of 0 Periodic Limb Movements.  The arousals were noted as: 18 were spontaneous, 0 were associated with PLMs, and 37 were associated with respiratory events. Audio and video analysis did not show any abnormal or unusual movements, behaviors, phonations or vocalizations. The patient took one bathroom break. EKG  was in keeping with normal sinus rhythm (NSR).  TITRATION STUDY WITH CPAP RESULTS: CPAP was initiated at 5 cmH20 with heated humidity per AASM split night standards and pressure was advanced to only 7 cmH20 because of hypopneas, apneas and desaturations.  At a PAP pressure of 7 cmH20, there was a reduction of the AHI to 3.8 /hour. The patient was given a FFM SIMPLUS in large seize.  Total recording  time (TRT) was 261 minutes, with a total sleep time (TST) of 257.5 minutes. The patient's sleep latency was 3.5 minutes. REM latency was 64.5 minutes.  The sleep efficiency was 98.7 %.   SLEEP ARCHITECTURE: Wake after sleep was 1.5 minutes, Stage N1 4 minutes, Stage N2 181 minutes, Stage N3 16.5 minutes and Stage R (REM sleep) 56 minutes. The percentages were: Stage N1 1.6%, Stage N2 70.3%, Stage N3 6.4% and Stage R (REM sleep) 21.7%.   RESPIRATORY ANALYSIS:  There were a total of 52 respiratory events: 1 obstructive apnea, 15 central apneas and 10 mixed apneas along with 26 hypopneas. The patient also had 0 respiratory event related arousals (RERAs). The total APNEA/HYPOPNEA INDEX (AHI) was 12.1 /hour and the total RESPIRATORY DISTURBANCE INDEX was 12.1 /hour. 2 events occurred in REM sleep and 50 events in NREM. The REM AHI was 2.1 /hour versus a non-REM AHI of 14.9 /hour. The patient spent 74% of total sleep time in the supine position. The supine AHI was 16.1 /hour, versus a non-supine AHI of 0.9/hour.  OXYGEN SATURATION & C02:  The wake baseline 02 saturation was 95%, with the lowest being 88%. Time spent below 89% saturation equaled 3 minutes.  AROUSALS/PERIODIC LIMB MOVEMENTS:   The patient had a total of 0 Periodic Limb Movements. The arousals were noted as: 25 were spontaneous, 0 were associated with PLMs, and 25 were associated with respiratory events.  POLYSOMNOGRAPHY IMPRESSION :  1. 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 Central Sleep Apnea   2. Normal EKG, normal oxygen saturation, no PLMs.   RECOMMENDATIONS: CPAP did reduce the AHI but central apnea will unlikely resolve completely. We will order an auto-titration CPAP device 6-10 cm water with 1 cm EPR.  2) My recommendation is to stay in non- supine sleep position, which is difficult to achieve with a large FFM. 3) Recommendation was to finally stop smoking- the patient reported he quit on 04-15-2018-  Congratulation!    Post-study, the patient indicated that sleep was better than usual. A follow up appointment will be scheduled in the Sleep Clinic at [facility].    I certify that I have reviewed the entire raw data recording prior to the issuance of this report in accordance with the Standards of Accreditation of the American Academy of Sleep Medicine (AASM)   Larey Seat, M.D.   07-24-2018  Diplomat, American Board of Psychiatry and Neurology  Diplomat, [place] of Sleep Medicine Medical Director, [place] [facility] at [facility]

## 2018-07-24 NOTE — Addendum Note (Signed)
Addended by: Larey Seat on: 07/24/2018 05:04 PM   Modules accepted: Orders

## 2018-07-25 ENCOUNTER — Telehealth: Payer: Self-pay | Admitting: Neurology

## 2018-07-25 NOTE — Telephone Encounter (Signed)
I called pt. I advised pt that Dr. Brett Fairy reviewed their sleep study results and found that pt has complex sleep apnea. Dr. Brett Fairy recommends that pt starts auto CPAP 6-10cm water pressure. I reviewed PAP compliance expectations with the pt. Pt is agreeable to starting a CPAP. I advised pt that an order will be sent to a DME, [facility], and [facility] will call the pt within about one week after they file with the pt's insurance. [facility] will show the pt how to use the machine, fit for masks, and troubleshoot the CPAP if needed. A follow up appt was made for insurance purposes with Dr. Brett Fairy on Dec 11,2019 at 2:30 pm. Pt verbalized understanding to arrive 15 minutes early and bring their CPAP. A letter with all of this information in it will be mailed to the pt as a reminder. I verified with the pt that the address we have on file is correct. Pt verbalized understanding of results. Pt had no questions at this time but was encouraged to call back if questions arise.

## 2018-07-25 NOTE — Telephone Encounter (Signed)
Called patient to discuss sleep study results. No answer at this time. LVM for the patient to call back.   

## 2018-07-25 NOTE — Telephone Encounter (Signed)
Patient is returning your call.  

## 2018-07-25 NOTE — Telephone Encounter (Signed)
-----   Message from Larey Seat, MD sent at 07/24/2018  5:04 PM EDT ----- POLYSOMNOGRAPHY IMPRESSION :  1. 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  Central Sleep Apnea  2. Normal EKG, normal oxygen saturation, no PLMs.   RECOMMENDATIONS: CPAP did reduce the AHI but central apnea will  unlikely resolve completely. We will order an auto-titration CPAP  device 6-10 cm water with 1 cm EPR.  2) My recommendation is to stay in non- supine sleep position,  which is difficult to achieve with a large FFM. 3) Recommendation was to finally stop smoking- the patient  reported he quit on 04-15-2018- Congratulation!  Post-study, the patient indicated that sleep was better than  usual.

## 2018-08-08 DIAGNOSIS — G4733 Obstructive sleep apnea (adult) (pediatric): Secondary | ICD-10-CM | POA: Diagnosis not present

## 2018-08-09 ENCOUNTER — Ambulatory Visit: Payer: Federal, State, Local not specified - PPO | Admitting: Occupational Therapy

## 2018-08-16 ENCOUNTER — Encounter: Payer: Self-pay | Admitting: Adult Health

## 2018-08-16 ENCOUNTER — Ambulatory Visit: Payer: Federal, State, Local not specified - PPO | Admitting: Adult Health

## 2018-08-16 VITALS — BP 117/81 | HR 70 | Ht 74.0 in | Wt 276.0 lb

## 2018-08-16 DIAGNOSIS — I610 Nontraumatic intracerebral hemorrhage in hemisphere, subcortical: Secondary | ICD-10-CM | POA: Diagnosis not present

## 2018-08-16 DIAGNOSIS — I10 Essential (primary) hypertension: Secondary | ICD-10-CM

## 2018-08-16 DIAGNOSIS — G4733 Obstructive sleep apnea (adult) (pediatric): Secondary | ICD-10-CM

## 2018-08-16 DIAGNOSIS — E785 Hyperlipidemia, unspecified: Secondary | ICD-10-CM | POA: Diagnosis not present

## 2018-08-16 NOTE — Progress Notes (Signed)
[facility] [address] [place]. [place] [address] [phone]       OFFICE FOLLOW UP NOTE  Richard Vaughn Date of Birth:  12-Dec-1950 Medical Record Number:  [id number]   Reason for visit: ICH follow up  CHIEF COMPLAINT:  Chief Complaint  Patient presents with  . Follow-up    Stroke follow up room in back hallway pt with [name]     HPI: Richard Vaughn is being seen today in the office for [place] due to hypertensive emergency on 04/15/18. History obtained from patient and chart review. Reviewed all radiology images and labs personally.  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  CT head repeat reviewed showed stable hematoma.  Carotid Doppler showed bilateral ICA stenosis of 1 to 39% with VAs antegrade.  2D echo showed an EF of 65 to 70% without cardiac source of embolus.  LDL satisfactory at 27.  A1c satisfactory at 5.3.  UDS was positive for THC.  Patient was on aspirin 325 mg prior to admission and this was stopped due to [place].  PT OT recommended outpatient PT/OT.  Patient was educated on risk factor modification such as controlling her blood pressure and smoking cessation.  Recommended repeat CT scan in 3 to 4 weeks and to follow blood sort recommended to initiate aspirin 81 mg for secondary stroke prevention.  Patient was discharged in stable condition.  05/15/2018 visit: Patient is being seen today for hospital follow-up and is accompanied by his wife.  Continues to have numbness on left side of his face, dysarthria and left-sided weakness but this has been improving.   Continues to participate in PT/OT/ST at our neuro rehab center [place].  Blood pressure today satisfactory 122/82 and per patient, this is typically lower.  PCP recently prescribed fenofibrate for triglycerides at 327 (previous 08/15/17  90).  Patient is concerned in regards to this elevation as he was staying active by working out and eating healthier.  Patient requesting if there is a chance of this not being accurate.  Recommended rechecking this after today's visit.  Patient has not started fenofibrate at this time and would like to see results of lipid panel prior to starting.  Unable to take Lipitor as this is caused nausea in the past.  Patient recently had sleep apnea testing with Dr. Brett Fairy which was inconclusive and recommended for patient to repeat this study.  Patient has not scheduled follow-up appointment and stated it was due to financial reasons as he had to pay $4000 for a prior study and this is difficult as he has previous hospital bills to pay.  Patient did return to doing all previous activities except for driving.  Denies new or worsening stroke/TIA symptoms at this time.  Interval history 08/16/2018: Patient is being seen today for scheduled follow-up visit.  He did undergo repeat sleep apnea testing which showed complex sleep apnea and was recommended to start CPAP.  Repeat CT scan performed which showed resolution of previous ICH therefore aspirin 81 mg daily was restarted.  Lipid panel was checked after prior appointment which continue to show elevation therefore fenofibrate  was started as recommended by PCP.  On 07/10/2018, PCP stop fenofibrate and started atorvastatin 20 mg daily as per her notes, patient declined ever using statin in the past.  Patient states that he did not start atorvastatin as he prefers to increase his workouts and eat healthier in order to decrease his cholesterol levels versus taking cholesterol medication.  Blood pressure today 117/81 and per notes, PCP  prescribed amlodipine 2.5 mg daily on 07/10/2018 for tighter HTN management.  He has started using CPAP for OSA management and has been compliant with treatment plan.  He denies residual numbness on left side of his face but does have mild dysarthria and minimal left-sided weakness but does continue to see improvement.  Denies new or worsening stroke/TIA symptoms.   ROS:   14 system review of systems performed and negative with exception of apnea, back pain and loss  PMH:  Past Medical History:  Diagnosis Date  . Barrett's esophagus   . Chronic back pain   . Gastritis    Mild  . Gastroparesis   . GERD (gastroesophageal reflux disease)   . Headache   . Hyperglycemia   . Nephrolithiasis   . Renal cyst   . Stricture and stenosis of esophagus   . Stroke ([place]) 07-2015  . Vision abnormalities     PSH:  Past Surgical History:  Procedure Laterality Date  . [place] SURGERY  2012  . COLONOSCOPY    . [place] SURGERY  2005, 2010   replaced L4 and L5  . UPPER GASTROINTESTINAL ENDOSCOPY      Social History:  Social History   Socioeconomic History  . Marital status: Married    Spouse name: Not on file  . Number of children: 3  . Years of education: Not on file  . Highest education level: Not on file  Occupational History  . Occupation: retired    Fish farm manager: [employer]  Social Needs  . Financial resource strain: Not on file  . Food insecurity:    Worry: Not on file    Inability: Not on file  . Transportation needs:    Medical: Not on file    Non-medical: Not on file  Tobacco Use  . Smoking status: Former Smoker    Packs/day: 0.50    Years: 30.00    Pack years: 15.00    Types: Cigarettes    Last attempt to quit: 04/15/2018    Years since quitting: 0.3  . Smokeless tobacco: Never Used  Substance and Sexual Activity  . Alcohol use: No    Alcohol/week: 0.0 standard drinks  . Drug use: Yes    Types: Oxycodone, Morphine    Comment: last use 04/2018  . Sexual  activity: Yes  Lifestyle  . Physical activity:    Days per week: Not on file    Minutes per session: Not on file  . Stress: Not on file  Relationships  . Social connections:    Talks on phone: Not on file    Gets together: Not on file    Attends religious service: Not on file    Active member of club or organization: Not on file    Attends meetings of clubs or organizations: Not on file    Relationship status: Not on file  . Intimate partner violence:    Fear of current or ex partner: Not on file    Emotionally abused: Not on file    Physically abused: Not on file    Forced  sexual activity: Not on file  Other Topics Concern  . Not on file  Social History Narrative  . Not on file    Family History:  Family History  Problem Relation Age of Onset  . Hypertension Father   . Stroke Father   . Hypertension Mother   . Liver cancer Mother   . Hypertension Sister        2 other sisters has also  . Stroke Sister   . Stroke Sister   . Hypertension Brother        2nd brother has also  . Colon cancer Neg Hx   . Esophageal cancer Neg Hx   . Rectal cancer Neg Hx   . Stomach cancer Neg Hx   . Colon polyps Neg Hx     Medications:   Current Outpatient Medications on File Prior to Visit  Medication Sig Dispense Refill  . amLODipine (NORVASC) 2.5 MG tablet Take 1 tablet (2.5 mg total) by mouth daily. 90 tablet 1  . aspirin 81 MG tablet Take 1 tablet (81 mg total) by mouth daily. 30 tablet   . NEXIUM 40 MG capsule TAKE 1 CAPSULE DAILY AT 12 NOON. 90 capsule 3  . olmesartan (BENICAR) 20 MG tablet Take 1 tablet (20 mg total) by mouth daily. 90 tablet 1  . oxymorphone (OPANA) 10 MG tablet Take 10 mg by mouth every 6 (six) hours as needed for pain.     . traZODone (DESYREL) 50 MG tablet Take 1 tablet (50 mg total) by mouth at bedtime. 90 tablet 1   No current facility-administered medications on file prior to visit.     Allergies:  No Known Allergies   Physical Exam  Vitals:    08/16/18 1329  BP: 117/81  Pulse: 70  Weight: 276 lb (125.2 kg)  Height: 6\' 2"  (1.88 m)   Body mass index is 35.44 kg/m. No exam data present  General: well developed, well nourished, pleasant elderly African-American male, seated, in no evident distress Head: head normocephalic and atraumatic.   Neck: supple with no carotid or supraclavicular bruits Cardiovascular: regular rate and rhythm, no murmurs Musculoskeletal: no deformity Skin:  no rash/petichiae Vascular:  Normal pulses all extremities  Neurologic Exam Mental Status: Awake and fully alert.  Mild dysarthria.  Oriented to place and time. Recent and remote memory intact. Attention span, concentration and fund of knowledge appropriate. Mood and affect appropriate.  Cranial Nerves: Fundoscopic exam reveals sharp disc margins. Pupils equal, briskly reactive to light. Extraocular movements full without nystagmus. Visual fields full to confrontation. Hearing intact. Facial sensation intact. Face, tongue, palate moves normally and symmetrically.  Motor: Normal bulk and tone. Normal strength in all tested extremity muscles.  No weakness noted on exam Sensory.: intact to touch , pinprick , position and vibratory sensation.  Coordination: Rapid alternating movements normal in all extremities. Finger-to-nose and heel-to-shin performed accurately bilaterally. Gait and Station: Arises from chair without difficulty. Stance is normal. Gait demonstrates normal stride length and balance . Able to heel, toe and tandem walk without difficulty.  Reflexes: 1+ and symmetric. Toes downgoing.      Diagnostic Data (Labs, Imaging, Testing)  CT HEAD WO CONTRAST 06/01/2018 IMPRESSION:  Expected evolutionary changes in the previously seen right thalamic hemorrhage with significant resolution without any new or unexpected findings. Age-appropriate changes of chronic microvascular ischemia which are unchanged.    ASSESSMENT: Richard Vaughn is a 67 y.o.  year old male here with [place] on 04/15/2018 secondary to hypertensive  emergency. Vascular risk factors include HTN, HLD and smoking.  Patient is being seen today for scheduled follow-up visit and overall has been doing well with continued improvement in mild dysarthria and subjective minimal left hemiparesis.    PLAN: -Continue aspirin 81 mg daily for secondary stroke prevention -F/u with PCP regarding your HLD and HTN management -Request lipid panel be checked at next follow-up appointment with PCP and advised patient if remains elevated at that time, it would be strongly recommended to start statin for HLD management -patient is in agreement to this plan -Continue compliance of CPAP for OSA management along with a scheduled follow-up with Dr. Brett Fairy in this office -Continue to stay active and maintain a healthy diet -patient was provided with healthy eating patient education from up-to-date -continue to monitor BP at home -Maintain strict control of hypertension with blood pressure goal below 130/90, diabetes with hemoglobin A1c goal below 6.5% and cholesterol with LDL cholesterol (bad cholesterol) goal below 70 mg/dL. I also advised the patient to eat a healthy diet with plenty of whole grains, cereals, fruits and vegetables, exercise regularly and maintain ideal body weight.  Follow up in 6 months or call earlier if needed -if stable at follow-up visit, patient can follow-up as needed at that time   Greater than 50% of time during this 25 minute visit was spent on counseling,explanation of diagnosis of ICH, reviewing risk factor management of HLD and HTN, planning of further management, discussion with patient and family and coordination of care    Venancio Poisson, [facility]  [facility] [address] [place], [place] [address]  Phone [phone] Fax [phone]

## 2018-08-16 NOTE — Patient Instructions (Signed)
Continue aspirin 81 mg daily  for secondary stroke prevention  Continue to follow up with PCP regarding cholesterol and blood pressure management - please have primary doctor recheck cholesterol levels at next follow up appointment - if remains elevated, we should highly consider starting cholesterol medication at that time  Continue to stay active and maintain a healthy diet  Continue to use CPAP machine for OSA management   Continue to monitor blood pressure at home  Maintain strict control of hypertension with blood pressure goal below 130/90, diabetes with hemoglobin A1c goal below 6.5% and cholesterol with LDL cholesterol (bad cholesterol) goal below 70 mg/dL. I also advised the patient to eat a healthy diet with plenty of whole grains, cereals, fruits and vegetables, exercise regularly and maintain ideal body weight.  Followup in the future with me in 6 months or call earlier if needed       Thank you for coming to see [place] at [facility]. I hope we have been able to provide you high quality care today.  You may receive a patient satisfaction survey over the next few weeks. We would appreciate your feedback and comments so that we may continue to improve ourselves and the health of our patients.

## 2018-08-16 NOTE — Progress Notes (Signed)
I have reviewed the note and agree with the plan as stated

## 2018-09-07 DIAGNOSIS — G4733 Obstructive sleep apnea (adult) (pediatric): Secondary | ICD-10-CM | POA: Diagnosis not present

## 2018-09-15 DIAGNOSIS — R350 Frequency of micturition: Secondary | ICD-10-CM | POA: Diagnosis not present

## 2018-09-15 DIAGNOSIS — N401 Enlarged prostate with lower urinary tract symptoms: Secondary | ICD-10-CM | POA: Diagnosis not present

## 2018-09-15 DIAGNOSIS — N5201 Erectile dysfunction due to arterial insufficiency: Secondary | ICD-10-CM | POA: Diagnosis not present

## 2018-10-08 DIAGNOSIS — G4733 Obstructive sleep apnea (adult) (pediatric): Secondary | ICD-10-CM | POA: Diagnosis not present

## 2018-10-23 ENCOUNTER — Encounter: Payer: Self-pay | Admitting: Neurology

## 2018-10-25 ENCOUNTER — Encounter: Payer: Self-pay | Admitting: Neurology

## 2018-10-25 ENCOUNTER — Ambulatory Visit (INDEPENDENT_AMBULATORY_CARE_PROVIDER_SITE_OTHER): Payer: Federal, State, Local not specified - PPO | Admitting: Neurology

## 2018-10-25 VITALS — BP 118/80 | HR 62 | Ht 74.0 in | Wt 282.0 lb

## 2018-10-25 DIAGNOSIS — Z79891 Long term (current) use of opiate analgesic: Secondary | ICD-10-CM

## 2018-10-25 DIAGNOSIS — I69398 Other sequelae of cerebral infarction: Secondary | ICD-10-CM | POA: Diagnosis not present

## 2018-10-25 DIAGNOSIS — G4737 Central sleep apnea in conditions classified elsewhere: Secondary | ICD-10-CM

## 2018-10-25 DIAGNOSIS — G4734 Idiopathic sleep related nonobstructive alveolar hypoventilation: Secondary | ICD-10-CM | POA: Diagnosis not present

## 2018-10-25 DIAGNOSIS — I610 Nontraumatic intracerebral hemorrhage in hemisphere, subcortical: Secondary | ICD-10-CM

## 2018-10-25 DIAGNOSIS — G4739 Other sleep apnea: Secondary | ICD-10-CM

## 2018-10-25 DIAGNOSIS — G4731 Primary central sleep apnea: Secondary | ICD-10-CM

## 2018-10-25 HISTORY — DX: Other sleep apnea: G47.39

## 2018-10-25 NOTE — Progress Notes (Signed)
SLEEP MEDICINE CLINIC   Provider:  Larey Seat, M D  Primary Care Physician:  Martinique, [name], MD   Referring Provider: Dr Grayland Ormond and his  primary neurologist is Dr Leonie Man, MD    Chief Complaint  Patient presents with  . Follow-up    pt with wife. rm 11. pt states that machine is working well. he is concerned that apnea events are increasing. he sleeps better with the trazodone and tolerates the machine better. D[facility] [employer]    HPI:  Richard Vaughn is a 67 y.o. male patient , seen here in a referral from Dr. Shawna Orleans, Dr. Erlinda Hong, and  Dr Leonie Man, MD. He has last been seen twice at [facility] by Venancio Poisson, NP.  Since my last visit with Mr. Doreene Nest he has undergone a sleep study as ordered.  Today is 61 December 2019 and his split-night polysomnography took place on 20 July 2018.  The patient was diagnosed with mild to moderate sleep apnea at an AHI of 16, there were more central and mixed apneas 1 obstructive apneas noted, and a strong supine sleep dependency.  While the patient slept on his back his apnea index went up to 105 an hour.  He did not have periodic limb movements and there were no prolonged oxygen desaturations.  The sleep tech gave the patient a full facemask type Simplus in large size and titrated progressively to a 5 final pressure of only 7 cmH2O.  The patient's apnea was not completely resolved but it was significantly reduced.  He was placed on an auto titration and I was able to review the last 30 days of use.  First of all I congratulated Mr. Mariea Clonts that he was able to use the machine every of these 30 days, and that he quit smoking.  His AutoSet is a 6 to 10 cm pressure window with 1 cm expiratory pressure relief and he has used it every day on average 6 hours and 19 minutes.  His residual apnea index however has been 9.2 with central apneas making up all of the residual apneas.  For this reason we need to reduce either the upper limit of his maximum pressure or try the patient on  a BiPAP machine. I will reset the CPAP window 6 and 8 cm water - and if this fails will try BiPAP and ASV.      I have the pleasure of meeting today with Mr. Mariea Clonts, and meanwhile 67 year old gentleman that has been treated for strokes at [facility] in 2017, and had followed up with Dr. Leonie Man-  At the time he had been referred for a sleep study and a baseline polysomnography have been performed this was on 05 October 2016 . He had suffered a stroke on 02 August 2015 before which left him with mild right dominant weakness clumsiness and hesitation in speech.  His wife had noted him to snore and have apneas at the time he was still struggling to stop smoking.  He has been sleepier than usual since her stroke in 2016 but had recovered very well in terms of motor function.  History of GERD, gastroparesis without diabetes, renal cyst, renal stones, back pain headache stroke esophagitis and gastritis have been mentioned.  His BMI at the time was 35.1, his AHI was 18.7 and mild to moderate degree of sleep apnea which was not exacerbated by snoring.  He slept poorly in the lab only 52% of the recorded time was in sleep.  His sleep apnea was strictly supine dependent.  While the patient slept on his back he suffered nearly all apneas of the night when not on his back his AHI was 3.4.  He did not have prolonged desaturations and he did not have nocturia.  He had primary central sleep apnea which could have been induced by narcotic pain medication at the time.  Since his sleep apnea was exacerbated when on his back the main treatment for him would be to sleep on his side.  CPAP was not the primary recommendation.  Unfortunately, the patient presented again with stroke symptoms of slurred speech and left-sided weakness, on 15 April 2018, less than 14 days ago.  He had a prior left basal ganglia stroke but these stroke symptoms affected the previous sleep better while working side.  Sudden onset of numbness  affecting his lips slurring of speech and left-sided body weakness was noted.  EMS was called he was brought to [facility], he arrived at 12:45 AM on 15 April 2018.  He was to work awake and alert, the NIH stroke scale was 1 for sensory 1 for left arm 1 for left leg 2 for dysarthria total point of 5.  Had a normal CBC with differential, a normal metabolic panel, ( glucose was high but the patient was not fasting).  Creatinine was normal.  Noncontrast CT of the head the same night showed a right basal ganglia bleed etiology most likely due to hypertension.  It was a nontraumatic hemorrhage.  The patient was discharged on 3 June by Dr. Collie Siad, he was asked to stop aspirin and start taking Nexium Benicar Opana had been continued, Viagra  wasnot discontinued. Trazodone has been continued.  Upon discharge his lipid panel shows very high triglycerides, the low level of good cholesterol HDL was only 20, total cholesterol however was not elevated at 112.  Carotid Doppler 30 [place] bilateral ICA stenosis, vertebral flow was antegrade and intact, 2D echocardiogram on 2 June showed an ejection fraction between 65 and 70% excellent wall motion.  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  The patient had been placed on oxygen especially at night during his hospitalization hypoxemia and witnessed apnea with a reason to have him evaluated now.  His stroke neurologist , Dr Erlinda Hong, also wanted him to finally quit smoking.   Chief complaint according to patient : " My wife is worried about my breathing in sleep"  Sleep habits are as follows: The patient's usual bedtime is between midnight and 1 AM.  That is when he retreats to his bedroom, he is asleep within 5 to 10 minutes.  He prefers to fall asleep on his side, on 2 pillows.  His wife reports that she wakes up because he puts the TV on in the bedroom.   The marital bedroom is described as cool, but neither quiet nor dark. He stays asleep for about 4 to 5 hours before waking up for bathroom break, another 2 hours later. He ralls no dreams, is not enacting any dreams. He wakes up spontaneously around 8 AM, estimated average sleep time  Is 6-7 hours.    Sleep medical history and family sleep history: see above . sister has OSA.  No tonsillectomy , no nasal surgery, he had nasal septum deviation due to 2 nasal fracture. .   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 Still active. 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 Caffeine -5 iced teas a day, "decaff" , no soda, and not Coffee.  ETOH   Review of Systems: Out of a complete 14 system review, the patient complains of only the following symptoms, and all other reviewed systems are negative.  Snoring, hypoxemia, witnessed apnea.   Epworth score 4/ 24  , Fatigue severity score 24   , depression score 8/ 15 - positive for depression.    Social History   Socioeconomic History  . Marital status: Married    Spouse name: Not on file  . Number of children: 3  . Years of education: Not on file  . Highest education level: Not on file  Occupational History  . Occupation: retired    Fish farm manager: [employer]  Social Needs  . Financial resource strain: Not on file  . Food insecurity:    Worry: Not on file    Inability: Not on file  . Transportation needs:    Medical: Not on file    Non-medical: Not on file  Tobacco Use  . Smoking status: Former Smoker    Packs/day: 0.50    Years: 30.00    Pack years: 15.00    Types: Cigarettes    Last attempt to quit: 04/15/2018    Years since quitting: 0.5  . Smokeless tobacco: Never Used  Substance and Sexual Activity  . Alcohol use: No    Alcohol/week: 0.0 standard drinks  . Drug use: Yes    Types: Oxycodone, Morphine    Comment: last use 04/2018  . Sexual activity: Yes  Lifestyle  . Physical activity:    Days per week:  Not on file    Minutes per session: Not on file  . Stress: Not on file  Relationships  . Social connections:    Talks on phone: Not on file    Gets together: Not on file    Attends religious service: Not on file    Active member of club or organization: Not on file    Attends meetings of clubs or organizations: Not on file    Relationship status: Not on file  . Intimate partner violence:    Fear of current or ex partner: Not on file    Emotionally abused: Not on file    Physically abused: Not on file    Forced sexual activity: Not on file  Other Topics Concern  . Not on file  Social History Narrative  . Not on file    Family History  Problem Relation Age of Onset  . Hypertension Father   . Stroke Father   . Hypertension Mother   . Liver cancer Mother   . Hypertension Sister        2 other sisters has also  . Stroke Sister   . Stroke Sister   . Hypertension Brother        2nd brother has also  . Colon cancer Neg Hx   . Esophageal cancer Neg Hx   . Rectal cancer Neg Hx   . Stomach cancer Neg Hx   . Colon polyps Neg Hx     Past Medical History:  Diagnosis Date  . Barrett's esophagus   . Chronic back pain   . Gastritis    Mild  . Gastroparesis   . GERD (gastroesophageal reflux disease)   . Headache   . Hyperglycemia   . Nephrolithiasis   . Renal cyst   . Stricture and  stenosis of esophagus   . Stroke ([place]) 07-2015  . Vision abnormalities     Past Surgical History:  Procedure Laterality Date  . [place] SURGERY  2012  . COLONOSCOPY    . [place] SURGERY  2005, 2010   replaced L4 and L5  . UPPER GASTROINTESTINAL ENDOSCOPY      Current Outpatient Medications  Medication Sig Dispense Refill  . amLODipine (NORVASC) 2.5 MG tablet Take 1 tablet (2.5 mg total) by mouth daily. 90 tablet 1  . aspirin 81 MG tablet Take 1 tablet (81 mg total) by mouth daily. 30 tablet   . NEXIUM 40 MG capsule TAKE 1 CAPSULE DAILY AT 12 NOON. 90 capsule 3  . olmesartan  (BENICAR) 20 MG tablet Take 1 tablet (20 mg total) by mouth daily. 90 tablet 1  . oxymorphone (OPANA) 10 MG tablet Take 10 mg by mouth every 6 (six) hours as needed for pain.     . traZODone (DESYREL) 50 MG tablet Take 1 tablet (50 mg total) by mouth at bedtime. 90 tablet 1   No current facility-administered medications for this visit.     Allergies as of 10/25/2018  . (No Known Allergies)    Vitals: BP 118/80   Pulse 62   Ht 6\' 2"  (1.88 m)   Wt 282 lb (127.9 kg)   BMI 36.21 kg/m  Last Weight:  Wt Readings from Last 1 Encounters:  10/25/18 282 lb (127.9 kg)   EPP:IRJJ mass index is 36.21 kg/m.     Last Height:   Ht Readings from Last 1 Encounters:  10/25/18 6\' 2"  (1.88 m)    Physical exam:  General: The patient is awake, alert and appears not in acute distress. The patient is well groomed. Head: Normocephalic, atraumatic. Neck is supple. Mallampati 5  neck circumference:17.5 . Nasal airflow patent ,  Retrognathia is not seen.  Cardiovascular:  Regular rate and rhythm , without  murmurs or carotid bruit, and without distended neck veins. Respiratory: Lungs are clear to auscultation. Skin:  Without evidence of edema, or rash Trunk: BMI is 33. Neurologic exam : The patient is awake and alert, oriented to place and time.     Cranial nerves: Intact smell, but lost taste since last stroke ! Pupils are equal and briskly reactive to light.  Extraocular movements  in vertical and horizontal planes intact and without nystagmus.  Visual fields by finger perimetry are intact. Hearing to finger rub intact.   Facial sensation intact to fine touch. Facial motor strength is symmetric and tongue and uvula move midline. Shoulder shrug was symmetrical.   Motor exam: abnormal tone, muscle bulk -  Softer tone on upper right extremity and more rigidity over left biceps. Left grip weaker .  Sensory:  Deferred Coordination: deferred Gait and station: Patient walks with a walker as assistive  device .Deep tendon reflexes: brisk on the left, up going toe bilaterally.    Assessment:  After physical and neurologic examination, review of laboratory studies,  Personal review of imaging studies, reports of other /same  Imaging studies, results of polysomnography and / or neurophysiology testing and pre-existing records as far as provided in visit., my assessment is :  His residual apnea index however has been 9.2 with central apneas making up all of the residual apneas.  For this reason we need to reduce either the upper limit of his maximum pressure or try the patient on a BiPAP machine. I will reset the CPAP window 6 and  8 cm water - and if this fails will try BiPAP and ASV.     1)  The patient was advised of the nature of the diagnosed disorder , the treatment options and the  risks for general health and wellness arising from not treating the condition.   I spent more than 25 minutes of face to face time with the patient and his wife .  Greater than 50% of time was spent in counseling and coordination of care. We have discussed the diagnosis and differential and I answered the patient's questions.    Plan:  Treatment plan and additional workup :  His residual apnea index however has been 9.2 with central apneas making up all of the residual apneas.  For this reason we need to reduce either the upper limit of his maximum pressure or try the patient on a BiPAP machine. I will reset the CPAP window 6 and 8 cm water - and if this fails will try BiPAP and ASV.   Follow up with MD and later with Venancio Poisson, stroke NP.  Larey Seat, MD 08/67/6195, 0:93 PM  Certified in Neurology by ABPN Certified in [place] by [facility] [address] [place], [place] [address]

## 2018-11-07 DIAGNOSIS — G4733 Obstructive sleep apnea (adult) (pediatric): Secondary | ICD-10-CM | POA: Diagnosis not present

## 2018-11-10 ENCOUNTER — Other Ambulatory Visit: Payer: Self-pay | Admitting: Family Medicine

## 2018-11-10 DIAGNOSIS — I674 Hypertensive encephalopathy: Secondary | ICD-10-CM

## 2018-12-25 ENCOUNTER — Telehealth: Payer: Self-pay | Admitting: Gastroenterology

## 2018-12-25 ENCOUNTER — Other Ambulatory Visit: Payer: Self-pay

## 2018-12-25 ENCOUNTER — Other Ambulatory Visit: Payer: Self-pay | Admitting: Gastroenterology

## 2018-12-25 DIAGNOSIS — K219 Gastro-esophageal reflux disease without esophagitis: Secondary | ICD-10-CM

## 2018-12-25 MED ORDER — NEXIUM 40 MG PO CPDR: 40.0000 mg | DELAYED_RELEASE_CAPSULE | Freq: Every day | ORAL | 1 refills | Status: DC

## 2019-01-10 ENCOUNTER — Ambulatory Visit: Payer: Medicare Other | Admitting: Family Medicine

## 2019-01-10 ENCOUNTER — Encounter: Payer: Self-pay | Admitting: Family Medicine

## 2019-01-10 VITALS — BP 124/80 | HR 63 | Temp 98.0°F | Resp 12 | Ht 74.0 in | Wt 274.5 lb

## 2019-01-10 DIAGNOSIS — I674 Hypertensive encephalopathy: Secondary | ICD-10-CM | POA: Diagnosis not present

## 2019-01-10 DIAGNOSIS — E785 Hyperlipidemia, unspecified: Secondary | ICD-10-CM | POA: Diagnosis not present

## 2019-01-10 DIAGNOSIS — I2510 Atherosclerotic heart disease of native coronary artery without angina pectoris: Secondary | ICD-10-CM | POA: Diagnosis not present

## 2019-01-10 DIAGNOSIS — G4700 Insomnia, unspecified: Secondary | ICD-10-CM

## 2019-01-10 LAB — BASIC METABOLIC PANEL
BUN: 15 mg/dL (ref 6–23)
CO2: 28 mEq/L (ref 19–32)
Calcium: 9.3 mg/dL (ref 8.4–10.5)
Chloride: 105 mEq/L (ref 96–112)
Creatinine, Ser: 1.05 mg/dL (ref 0.40–1.50)
GFR: 85.72 mL/min (ref >60.00)
Glucose, Bld: 87 mg/dL (ref 70–99)
POTASSIUM: 4.3 meq/L (ref 3.5–5.1)
Sodium: 140 mEq/L (ref 135–145)

## 2019-01-10 LAB — LIPID PANEL
Cholesterol: 133 mg/dL (ref 0–200)
HDL: 34.1 mg/dL — ABNORMAL LOW (ref >39.00)
LDL CALC: 81 mg/dL (ref 0–99)
NonHDL: 98.5
Total CHOL/HDL Ratio: 4
Triglycerides: 88 mg/dL (ref 0.0–149.0)
VLDL: 17.6 mg/dL (ref 0.0–40.0)

## 2019-01-10 MED ORDER — TRAZODONE HCL 50 MG PO TABS: 50.0000 mg | ORAL_TABLET | Freq: Every day | ORAL | 1 refills | Status: DC

## 2019-01-10 MED ORDER — OLMESARTAN MEDOXOMIL 20 MG PO TABS: 20.0000 mg | ORAL_TABLET | Freq: Every day | ORAL | 1 refills | Status: DC

## 2019-01-10 MED ORDER — AMLODIPINE BESYLATE 2.5 MG PO TABS: 2.5000 mg | ORAL_TABLET | Freq: Every day | ORAL | 1 refills | Status: DC

## 2019-01-10 NOTE — Patient Instructions (Signed)
A few things to remember from today's visit:   Insomnia, unspecified type - Plan: traZODone (DESYREL) 50 MG tablet  Hyperlipidemia with target LDL less than 70 - Plan: Basic metabolic panel, Lipid panel  CVD (cardiovascular disease)  Benign essential HTN - Plan: Basic metabolic panel  Systolic hypertension with cerebrovascular disease - Plan: amLODipine (NORVASC) 2.5 MG tablet  No changes in your medications today. Continue monitoring blood pressure.  Please be sure medication list is accurate. If a new problem present, please set up appointment sooner than planned today.

## 2019-01-10 NOTE — Assessment & Plan Note (Signed)
Problem is well controlled with trazodone 50 mg daily. No changes in current management. Good sleep hygiene. Follow-up in 6 months.

## 2019-01-10 NOTE — Progress Notes (Signed)
HPI:   Richard Vaughn is a 68 y.o. male, who is here today for chronic disease management.  He was last seen on 07/10/18  Since his last OV he has followed with neurologist.  HLD: He is not taking Atorvastatin 20 mg ,recommended last OV. He denies side effects,he would like to stop most medications if possible and does not want to start new meds. He is following low fat diet and decreased red meat intake.  Lab Results  Component Value Date   CHOL 142 05/15/2018   HDL 30 (L) 05/15/2018   LDLCALC 87 05/15/2018   TRIG 127 05/15/2018   CHOLHDL 4.7 05/15/2018   Planning on starting plant based diet.   HTN: He is on Benicar 20 mg daily and Amlodipine 2.5 mg daily. BP reading at home, 115-120's/80. Denies severe/frequent headache, visual changes, chest pain, dyspnea, palpitation, claudication, new focal weakness, or edema.  CVA,he is not longer requiring assistance for walking. Completed PT. Taking Plavix 75 mg daily and Aspirin 81 mg daly. He has not noted side effects.  Lab Results  Component Value Date   CREATININE 1.07 04/17/2018   BUN 11 04/17/2018   NA 139 04/17/2018   K 3.7 04/17/2018   CL 106 04/17/2018   CO2 24 04/17/2018   Insomnia,taking Trazodone 50 mg at bedtime. Sleeping about 5-6 hours. No side effects.   OSA, he started using his CPAP,feeliing better in regard to morning fatigue..    Review of Systems  Constitutional: Negative for activity change, appetite change, fatigue, fever and unexpected weight change.  HENT: Negative for nosebleeds, sore throat and trouble swallowing.   Eyes: Negative for redness and visual disturbance.  Respiratory: Negative for cough, shortness of breath and wheezing.   Cardiovascular: Negative for chest pain, palpitations and leg swelling.  Gastrointestinal: Negative for abdominal pain, nausea and vomiting.  Genitourinary: Negative for decreased urine volume, dysuria and hematuria.  Musculoskeletal: Positive  for gait problem.  Neurological: Negative for weakness, numbness and headaches.  Psychiatric/Behavioral: Positive for sleep disturbance. The patient is nervous/anxious.     Current Outpatient Medications on File Prior to Visit  Medication Sig Dispense Refill  . aspirin 81 MG tablet Take 1 tablet (81 mg total) by mouth daily. 30 tablet   . NEXIUM 40 MG capsule Take 1 capsule (40 mg total) by mouth daily. Please schedule a Follow up appt with Dr. [name]: [phone], #2 30 capsule 1  . oxymorphone (OPANA) 10 MG tablet Take 10 mg by mouth every 6 (six) hours as needed for pain.      No current facility-administered medications on file prior to visit.      Past Medical History:  Diagnosis Date  . Barrett's esophagus   . Chronic back pain   . Gastritis    Mild  . Gastroparesis   . GERD (gastroesophageal reflux disease)   . Headache   . Hyperglycemia   . Nephrolithiasis   . Renal cyst   . Stricture and stenosis of esophagus   . Stroke ([place]) 07-2015  . Vision abnormalities    No Known Allergies  Social History   Socioeconomic History  . Marital status: Married    Spouse name: Not on file  . Number of children: 3  . Years of education: Not on file  . Highest education level: Not on file  Occupational History  . Occupation: retired    Fish farm manager: [employer]  Social Needs  . Financial resource strain: Not on  file  . Food insecurity:    Worry: Not on file    Inability: Not on file  . Transportation needs:    Medical: Not on file    Non-medical: Not on file  Tobacco Use  . Smoking status: Former Smoker    Packs/day: 0.50    Years: 30.00    Pack years: 15.00    Types: Cigarettes    Last attempt to quit: 04/15/2018    Years since quitting: 0.7  . Smokeless tobacco: Never Used  Substance and Sexual Activity  . Alcohol use: No    Alcohol/week: 0.0 standard drinks  . Drug use: Yes    Types: Oxycodone, Morphine    Comment: last use 04/2018  . Sexual activity: Yes    Lifestyle  . Physical activity:    Days per week: Not on file    Minutes per session: Not on file  . Stress: Not on file  Relationships  . Social connections:    Talks on phone: Not on file    Gets together: Not on file    Attends religious service: Not on file    Active member of club or organization: Not on file    Attends meetings of clubs or organizations: Not on file    Relationship status: Not on file  Other Topics Concern  . Not on file  Social History Narrative  . Not on file    Vitals:   01/10/19 0901  BP: 124/80  Pulse: 63  Resp: 12  Temp: 98 F (36.7 C)  SpO2: 94%   Body mass index is 35.24 kg/m.   Physical Exam  Nursing note and vitals reviewed. Constitutional: He is oriented to person, place, and time. He appears well-developed. No distress.  HENT:  Head: Normocephalic and atraumatic.  Mouth/Throat: Oropharynx is clear and moist and mucous membranes are normal.  Eyes: Pupils are equal, round, and reactive to light. Conjunctivae are normal.  Cardiovascular: Normal rate and regular rhythm.  No murmur heard. Pulses:      Dorsalis pedis pulses are 2+ on the right side and 2+ on the left side.  Respiratory: Effort normal and breath sounds normal. No respiratory distress.  GI: Soft. He exhibits no mass. There is no hepatomegaly. There is no abdominal tenderness.  Musculoskeletal:        General: No edema.  Lymphadenopathy:    He has no cervical adenopathy.  Neurological: He is alert and oriented to person, place, and time. He has normal strength.  Otherwise stable gait with no assistance.  Skin: Skin is warm. No rash noted. No erythema.  Psychiatric: He has a normal mood and affect. His speech is slurred. Cognition and memory are normal.  Well groomed, good eye contact.     ASSESSMENT AND PLAN:   Richard Vaughn was seen today for chronic disease management.  Orders Placed This Encounter  Procedures  . Basic metabolic panel  . Lipid panel    Lab Results  Component Value Date   CHOL 133 01/10/2019   HDL 34.10 (L) 01/10/2019   LDLCALC 81 01/10/2019   TRIG 88.0 01/10/2019   CHOLHDL 4 01/10/2019   Lab Results  Component Value Date   CREATININE 1.05 01/10/2019   BUN 15 01/10/2019   NA 140 01/10/2019   K 4.3 01/10/2019   CL 105 01/10/2019   CO2 28 01/10/2019    Hyperlipidemia with target LDL less than 70 He is not interested in taking statin medications. We discussed benefits  of this type of medication, given the fact he has had history of CVD.  Insomnia Problem is well controlled with trazodone 50 mg daily. No changes in current management. Good sleep hygiene. Follow-up in 6 months.  Systolic hypertension with cerebrovascular disease BP adequately controlled. Explained that most likely he is going to be taking medication lifetime. Strongly recommend continuing current management. Continue monitoring BP at home. Low-salt diet recommended. Eye exam is current. Follow-up in 6 months.  CVD (cardiovascular disease) Residual slurred speech. Adequate BP control and continue Aspirin 81 mg. He is following with neurologist periodically. Strongly advised to consider statin medication.    Return in about 6 months (around 07/11/2019) for HTN,HLD.    Christmas Faraci G. Martinique, MD  [facility]. [place] office.

## 2019-01-10 NOTE — Assessment & Plan Note (Signed)
He is not interested in taking statin medications. We discussed benefits of this type of medication, given the fact he has had history of CVD.

## 2019-01-10 NOTE — Assessment & Plan Note (Signed)
Residual slurred speech. Adequate BP control and continue Aspirin 81 mg. He is following with neurologist periodically. Strongly advised to consider statin medication.

## 2019-01-10 NOTE — Assessment & Plan Note (Signed)
BP adequately controlled. Explained that most likely he is going to be taking medication lifetime. Strongly recommend continuing current management. Continue monitoring BP at home. Low-salt diet recommended. Eye exam is current. Follow-up in 6 months.

## 2019-01-23 ENCOUNTER — Ambulatory Visit (INDEPENDENT_AMBULATORY_CARE_PROVIDER_SITE_OTHER): Payer: Medicare Other | Admitting: Gastroenterology

## 2019-01-23 ENCOUNTER — Encounter: Payer: Self-pay | Admitting: Gastroenterology

## 2019-01-23 VITALS — BP 128/78 | HR 68 | Ht 74.0 in | Wt 273.0 lb

## 2019-01-23 DIAGNOSIS — R6881 Early satiety: Secondary | ICD-10-CM | POA: Diagnosis not present

## 2019-01-23 DIAGNOSIS — I2510 Atherosclerotic heart disease of native coronary artery without angina pectoris: Secondary | ICD-10-CM

## 2019-01-23 DIAGNOSIS — K2270 Barrett's esophagus without dysplasia: Secondary | ICD-10-CM | POA: Diagnosis not present

## 2019-01-23 DIAGNOSIS — K219 Gastro-esophageal reflux disease without esophagitis: Secondary | ICD-10-CM | POA: Diagnosis not present

## 2019-01-23 DIAGNOSIS — Z86010 Personal history of colonic polyps: Secondary | ICD-10-CM

## 2019-01-23 MED ORDER — NEXIUM 40 MG PO CPDR: 40.0000 mg | DELAYED_RELEASE_CAPSULE | Freq: Two times a day (BID) | ORAL | 5 refills | Status: DC

## 2019-01-23 NOTE — Patient Instructions (Addendum)
If you are age 66 or older, your body mass index should be between 23-30. Your Body mass index is 35.05 kg/m. If this is out of the aforementioned range listed, please consider follow up with your Primary Care Provider.  If you are age 19 or younger, your body mass index should be between 19-25. Your Body mass index is 35.05 kg/m. If this is out of the aformentioned range listed, please consider follow up with your Primary Care Provider.   You have been scheduled for an endoscopy. Please follow written instructions given to you at your visit today. If you use inhalers (even only as needed), please bring them with you on the day of your procedure. Your physician has requested that you go to www.startemmi.com and enter the access code given to you at your visit today. This web site gives a general overview about your procedure. However, you should still follow specific instructions given to you by our office regarding your preparation for the procedure.  Increase your Nexium to twice a day.   We have sent the following medications to your pharmacy for you to pick up at your convenience: Nexium 40mg : Twice a day   Thank you for entrusting me with your care and for choosing [employer], Dr. Browns Cellar

## 2019-01-23 NOTE — Progress Notes (Signed)
HPI :  68 y/o male here for a follow up visit. 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 He has been on nexium 40 mg once a day since last seen him. He doesn't have much pyrosis but does have some regurgitation which is bothering him more frequently. He doesn't have any nausea or vomiting, no dysphagia, however he does have some significant symptoms of early satiety which have been bothering him. He has changed his diet to mostly plan based, thinks that these symptoms have been bothering him for a few months. He denies any weight loss. His bowels are not bothering him at all. He denies any blood in the stools or changes in bowel habits. He denies any abdominal pains otherwise. 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 He has been on Reglan in the past with questionable benefit per review of chart.   Prior workup: EGD 01/29/2016 - 2cm hiatal hernia, irregular z-line, mild gastritis - path c/w BE without dysplasia, stable from last exam, biopsies negative for HP Colonoscopy 12/20/2014 - 2 small adenomas, diverticulosis - recall 12/2019 EGD 01/25/2013 - irregular z-line Gastric emptying study 02/19/2009 - delayed gastric emptying MRI abdomen 10/15/2008 - simple renal cysts.     Past Medical History:  Diagnosis Date  . Barrett's esophagus   . Chronic back pain   . Gastritis    Mild  . Gastroparesis   . GERD (gastroesophageal reflux disease)   . Headache   . Hyperglycemia   . Nephrolithiasis   . Renal cyst   . Stricture and stenosis of esophagus   . Stroke ([place]) 07-2015  . Vision abnormalities      Past Surgical History:  Procedure Laterality Date  . [place] [facility]  2012  . COLONOSCOPY    . [facility]GERY  2005, 2010   replaced L4 and L5  . UPPER GASTROINTESTINAL ENDOSCOPY     Family History    Problem Relation Age of Onset  . Hypertension Father   . Stroke Father   . Hypertension Mother   . Liver cancer Mother   . Hypertension Sister        2 other sisters has also  . Stroke Sister   . Stroke Sister   . Hypertension Brother        2nd brother has also  . Colon cancer Neg Hx   . Esophageal cancer Neg Hx   . Rectal cancer Neg Hx   . Stomach cancer Neg Hx   . Colon polyps Neg Hx    Social History   Tobacco Use  . Smoking status: Former Smoker    Packs/day: 0.50    Years: 30.00    Pack years: 15.00    Types: Cigarettes    Last attempt to quit: 04/15/2018    Years since quitting: 0.7  . Smokeless tobacco: Never Used  Substance Use Topics  . Alcohol use: No    Alcohol/week: 0.0 standard drinks  . Drug use: Yes    Types: Oxycodone, Morphine    Comment: last use 04/2018   Current Outpatient Medications  Medication Sig Dispense Refill  . amLODipine (NORVASC) 2.5 MG tablet Take 1 tablet (2.5 mg total) by mouth daily. 90 tablet 1  . aspirin 81 MG tablet Take 1 tablet (81 mg total) by mouth daily.  30 tablet   . NEXIUM 40 MG capsule Take 1 capsule (40 mg total) by mouth daily. Please schedule a Follow up appt with Dr. [name]: [phone], #2 30 capsule 1  . olmesartan (BENICAR) 20 MG tablet Take 1 tablet (20 mg total) by mouth daily. 90 tablet 1  . oxymorphone (OPANA) 10 MG tablet Take 10 mg by mouth every 6 (six) hours as needed for pain.     . traZODone (DESYREL) 50 MG tablet Take 1 tablet (50 mg total) by mouth at bedtime. 90 tablet 1   No current facility-administered medications for this visit.    No Known Allergies   Review of Systems: All systems reviewed and negative except where noted in HPI.   Lab Results  Component Value Date   WBC 9.0 04/17/2018   HGB 14.8 04/17/2018   HCT 43.6 04/17/2018   MCV 89.0 04/17/2018   PLT 168 04/17/2018    Lab Results  Component Value Date   CREATININE 1.05 01/10/2019   BUN 15 01/10/2019   NA 140 01/10/2019   K  4.3 01/10/2019   CL 105 01/10/2019   CO2 28 01/10/2019    Lab Results  Component Value Date   ALT 20 04/15/2018   AST 17 04/15/2018   ALKPHOS 77 04/15/2018   BILITOT 0.7 04/15/2018     Physical Exam: BP 128/78   Pulse 68   Ht 6\' 2"  (1.88 m)   Wt 273 lb (123.8 kg)   BMI 35.05 kg/m  Constitutional: Pleasant,well-developed, male in no acute distress. HEENT: Normocephalic and atraumatic. Conjunctivae are normal. No scleral icterus. Neck supple.  Cardiovascular: Normal rate, regular rhythm.  Pulmonary/chest: Effort normal and breath sounds normal. No wheezing, rales or rhonchi. Abdominal: Soft, nondistended, nontender. . There are no masses palpable. No hepatomegaly. Extremities: no edema Lymphadenopathy: No cervical adenopathy noted. Neurological: Alert and oriented to person place and time. Skin: Skin is warm and dry. No rashes noted. Psychiatric: Normal mood and affect. Behavior is normal.   ASSESSMENT AND PLAN: 68 year old male here for reassessment the following issues:  GERD / Barrett's / early satiety - has had some worsening reflux symptoms despite Nexium once a day. He does have a history of short segment Barrett's esophagus and is due for surveillance endoscopy for that issue. Given that and his early satiety which has been bothering him recently, we'll proceed with endoscopy to further evaluate. I discussed risks and benefits with him and he wanted to proceed. In the interim we'll increase his Nexium to twice daily to see if that helps. In regards to his history of abnormal gastric emptying study remotely, per review of chart he was on a lot of narcotics at the time, unclear if he truly had gastroparesis, he is no longer on narcotics. Pending results of EGD, if symptoms persist can consider a trial of Reglan versus repeating GES. We otherwise discussed alternative regimens for reflux if his regurgitation persists. He would not be a good candidate for fundoplication if he  had gastroparesis and that would definitively need to be assessed. Otherwise if he was able to lose some weight, he could be a candidate for TIF if no gastroparesis and can lower his BMI. Weight loss in general may help his reflux as well. He agreed.  History of colon polyps - based on new guidelines, next repeat colonoscopy is due 12/2021.  Payette Cellar, MD [facility]

## 2019-01-26 ENCOUNTER — Ambulatory Visit (AMBULATORY_SURGERY_CENTER): Payer: Medicare Other | Admitting: Gastroenterology

## 2019-01-26 ENCOUNTER — Other Ambulatory Visit: Payer: Self-pay

## 2019-01-26 ENCOUNTER — Encounter: Payer: Self-pay | Admitting: Gastroenterology

## 2019-01-26 VITALS — BP 119/87 | HR 52 | Temp 98.7°F | Resp 9 | Ht 74.0 in | Wt 273.0 lb

## 2019-01-26 DIAGNOSIS — K3189 Other diseases of stomach and duodenum: Secondary | ICD-10-CM

## 2019-01-26 DIAGNOSIS — K219 Gastro-esophageal reflux disease without esophagitis: Secondary | ICD-10-CM | POA: Diagnosis not present

## 2019-01-26 DIAGNOSIS — K2270 Barrett's esophagus without dysplasia: Secondary | ICD-10-CM | POA: Diagnosis not present

## 2019-01-26 DIAGNOSIS — R6881 Early satiety: Secondary | ICD-10-CM

## 2019-01-26 MED ORDER — SODIUM CHLORIDE 0.9 % IV SOLN: 500.0000 mL | INTRAVENOUS | Status: DC

## 2019-01-26 NOTE — Op Note (Signed)
[place] Patient Name: Richard Vaughn Procedure Date: 01/26/2019 4:28 PM MRN: [id number] Endoscopist: Richard Vaughn P. Havery Richard Vaughn , MD Age: 68 Referring MD:  Date of Birth: 1951-03-28 Gender: Male Account #: [id number] Procedure:                Upper GI endoscopy Indications:              Surveillance for malignancy due to personal history                            of Barrett's esophagus, Early satiety, GERD with                            breakthrough on once daily nexium, now on twice                            daily dosing with improvement in reflux symptoms Medicines:                Monitored Anesthesia Care Procedure:                Pre-Anesthesia Assessment:                           - Prior to the procedure, a History and Physical                            was performed, and patient medications and                            allergies were reviewed. The patient's tolerance of                            previous anesthesia was also reviewed. The risks                            and benefits of the procedure and the sedation                            options and risks were discussed with the patient.                            All questions were answered, and informed consent                            was obtained. Prior Anticoagulants: The patient has                            taken no previous anticoagulant or antiplatelet                            agents. ASA Grade Assessment: III - A patient with                            severe systemic disease. After reviewing the risks  and benefits, the patient was deemed in                            satisfactory condition to undergo the procedure.                           After obtaining informed consent, the endoscope was                            passed under direct vision. Throughout the                            procedure, the patient's blood pressure, pulse, and   oxygen saturations were monitored continuously. The                            Endoscope was introduced through the mouth, and                            advanced to the second part of duodenum. The upper                            GI endoscopy was accomplished without difficulty.                            The patient tolerated the procedure well. Scope In: Scope Out: Findings:                 Esophagogastric landmarks were identified: the                            Z-line was found at 42 cm, the gastroesophageal                            junction was found at 41 cm and the upper extent of                            the gastric folds was found at 43 cm from the                            incisors.                           The Z-line was irregular and was found 41 cm from                            the incisors, with a few small islands of salmon                            colored mucosa extending upwards of 1cm above the                            GEJ. Biopsies were taken with a cold forceps for  histology.                           The exam of the esophagus was otherwise normal.                           Patchy mildly erythematous mucosa was found in the                            gastric body and in the gastric antrum. Biopsies                            were taken with a cold forceps for Helicobacter                            pylori testing.                           The exam of the stomach was otherwise normal.                           The duodenal bulb and second portion of the                            duodenum were normal. Complications:            No immediate complications. Estimated blood loss:                            Minimal. Estimated Blood Loss:     Estimated blood loss was minimal. Impression:               - Esophagogastric landmarks identified.                           - Z-line irregular, 41 cm from the incisors.                             Biopsied.                           - Normal esophagus otherwise                           - Mildly erythematous mucosa in the gastric body                            and antrum. Biopsied.                           - Normal duodenal bulb and second portion of the                            duodenum. Recommendation:           - Patient has a contact number available for  emergencies. The signs and symptoms of potential                            delayed complications were discussed with the                            patient. Return to normal activities tomorrow.                            Written discharge instructions were provided to the                            patient.                           - Resume previous diet.                           - Continue present medications. Continue trial of                            nexium twice daily                           - Await pathology results. Richard Vaughn P. Armbruster, MD 01/26/2019 4:46:29 PM This report has been signed electronically.

## 2019-01-26 NOTE — Patient Instructions (Signed)
Continue present medicines and diet.  Await pathology results.  YOU HAD AN ENDOSCOPIC PROCEDURE TODAY AT [facility]:   Refer to the procedure report that was given to you for any specific questions about what was found during the examination.  If the procedure report does not answer your questions, please call your gastroenterologist to clarify.  If you requested that your care partner not be given the details of your procedure findings, then the procedure report has been included in a sealed envelope for you to review at your convenience later.  YOU SHOULD EXPECT: Some feelings of bloating in the abdomen. Passage of more gas than usual.  Walking can help get rid of the air that was put into your GI tract during the procedure and reduce the bloating. If you had a lower endoscopy (such as a colonoscopy or flexible sigmoidoscopy) you may notice spotting of blood in your stool or on the toilet paper. If you underwent a bowel prep for your procedure, you may not have a normal bowel movement for a few days.  Please Note:  You might notice some irritation and congestion in your nose or some drainage.  This is from the oxygen used during your procedure.  There is no need for concern and it should clear up in a day or so.  SYMPTOMS TO REPORT IMMEDIATELY:   Following upper endoscopy (EGD)  Vomiting of blood or coffee ground material  New chest pain or pain under the shoulder blades  Painful or persistently difficult swallowing  New shortness of breath  Fever of 100F or higher  Black, tarry-looking stools  For urgent or emergent issues, a gastroenterologist can be reached at any hour by calling [phone].   DIET:  We do recommend a small meal at first, but then you may proceed to your regular diet.  Drink plenty of fluids but you should avoid alcoholic beverages for 24 hours.  ACTIVITY:  You should plan to take it easy for the rest of today and you should NOT DRIVE or use  heavy machinery until tomorrow (because of the sedation medicines used during the test).    FOLLOW UP: Our staff will call the number listed on your records the next business day following your procedure to check on you and address any questions or concerns that you may have regarding the information given to you following your procedure. If we do not reach you, we will leave a message.  However, if you are feeling well and you are not experiencing any problems, there is no need to return our call.  We will assume that you have returned to your regular daily activities without incident.  If any biopsies were taken you will be contacted by phone or by letter within the next 1-3 weeks.  Please call us at [phone] if you have not heard about the biopsies in 3 weeks.    SIGNATURES/CONFIDENTIALITY: You and/or your care partner have signed paperwork which will be entered into your electronic medical record.  These signatures attest to the fact that that the information above on your After Visit Summary has been reviewed and is understood.  Full responsibility of the confidentiality of this discharge information lies with you and/or your care-partner.

## 2019-01-26 NOTE — Progress Notes (Signed)
Called to room to assist during endoscopic procedure.  Patient ID and intended procedure confirmed with present staff. Received instructions for my participation in the procedure from the performing physician.  

## 2019-01-26 NOTE — Progress Notes (Signed)
To PACU< VSS. Report to [email or web address] 

## 2019-01-29 ENCOUNTER — Telehealth: Payer: Self-pay

## 2019-01-29 NOTE — Telephone Encounter (Signed)
Called [phone] and left a messaged we tried to reach pt for a follow up call. maw

## 2019-01-29 NOTE — Telephone Encounter (Signed)
2nd follow up call attempt, left message.

## 2019-02-15 ENCOUNTER — Ambulatory Visit: Payer: Federal, State, Local not specified - PPO | Admitting: Adult Health

## 2019-02-19 ENCOUNTER — Encounter: Payer: Self-pay | Admitting: *Deleted

## 2019-02-19 ENCOUNTER — Telehealth: Payer: Self-pay | Admitting: *Deleted

## 2019-02-19 NOTE — Telephone Encounter (Signed)
Spoke to wife, Richard Vaughn (who is on DPR).  Consented for VIDEO VISIT.  Due to current COVID 19 pandemic, our office is severely reducing in office visits for at least the next 2 weeks, in order to minimize the risk to our patients and healthcare providers.  Pt understands that although there may be some limitations with this type of visit, we will take all precautions to reduce any security or privacy concerns.  Pt understands that this will be treated like an in office visit and we will file with pt's insurance. Pt's email is [email or web address] . Pt understands that the [employer] webex software must be downloaded and operational on the device pt plans to use for the visit.  Updated meds, allergies, history, pharmacy.

## 2019-02-20 ENCOUNTER — Ambulatory Visit (INDEPENDENT_AMBULATORY_CARE_PROVIDER_SITE_OTHER): Payer: Medicare Other | Admitting: Family Medicine

## 2019-02-20 ENCOUNTER — Ambulatory Visit: Payer: Federal, State, Local not specified - PPO | Admitting: Neurology

## 2019-02-20 ENCOUNTER — Other Ambulatory Visit: Payer: Self-pay

## 2019-02-20 DIAGNOSIS — G4737 Central sleep apnea in conditions classified elsewhere: Secondary | ICD-10-CM

## 2019-02-20 DIAGNOSIS — I69398 Other sequelae of cerebral infarction: Secondary | ICD-10-CM | POA: Diagnosis not present

## 2019-02-20 DIAGNOSIS — G4733 Obstructive sleep apnea (adult) (pediatric): Secondary | ICD-10-CM

## 2019-02-20 NOTE — Progress Notes (Signed)
PATIENT: Richard Vaughn DOB: [birth year]  REASON FOR VISIT: follow up HISTORY FROM: patient  Virtual Visit via Telephone Note  I connected with Richard Vaughn on 02/21/19 at  3:30 PM EDT by telephone and verified that I am speaking with the correct person using two identifiers.   I discussed the limitations, risks, security and privacy concerns of performing an evaluation and management service by telephone and the availability of in person appointments. I also discussed with the patient that there may be a patient responsible charge related to this service. The patient expressed understanding and agreed to proceed.   History of Present Illness:  02/21/19 Richard Vaughn is a 68 y.o. male for follow up.  Richard Vaughn reports that he is doing well with CPAP usage at home.  He is using machine every night.  He reports that some nights are better than others as far as restful sleep.  He has noticed an increase in the phlegm in the back of his throat since his last visit with [place] in December.  He is uncertain if this is correlated with change of setting.  Download report as follows:  01/20/2019 - 02/18/2019 Usage 01/20/2019 - 02/18/2019 Usage days 29/30 days (97%) >= 4 hours 28 days (93%) < 4 hours 1 days (3%) Usage hours 172 hours 31 minutes Average usage (total days) 5 hours 45 minutes Average usage (days used) 5 hours 57 minutes Median usage (days used) 5 hours 52 minutes Total used hours (value since last reset - 02/18/2019) 1,159 hours AirSense 10 AutoSet Serial number [id number] Mode AutoSet Min Pressure 5 cmH2O Max Pressure 8 cmH2O EPR Fulltime EPR level 1 Response Soft Therapy Pressure - cmH2O Median: 5.6 95th percentile: 7.3 Maximum: 7.7 Leaks - L/min Median: 0.0 95th percentile: 1.3 Maximum: 7.4 Events per hour AI: 4.4 HI: 2.6 AHI: 7.0 Apnea Index Central: 4.3 Obstructive: 0.0 Unknown: 0.0 RERA Index 0.2 Cheyne-Stokes respiration (average duration per night) 5 minutes  (2%)  History (copied from Dr Edwena Felty note on 10/25/2018)  HPI:  Richard Vaughn is a 68 y.o. male patient , seen here in a referral from Dr. Shawna Orleans, Dr. Erlinda Hong, and  Dr Leonie Man, MD. He has last been seen twice at [facility] by Venancio Poisson, NP.  Since my last visit with Mr. Doreene Nest he has undergone a sleep study as ordered.  Today is 32 December 2019 and his split-night polysomnography took place on 20 July 2018.  The patient was diagnosed with mild to moderate sleep apnea at an AHI of 16, there were more central and mixed apneas 1 obstructive apneas noted, and a strong supine sleep dependency.  While the patient slept on his back his apnea index went up to 105 an hour.  He did not have periodic limb movements and there were no prolonged oxygen desaturations.  The sleep tech gave the patient a full facemask type Simplus in large size and titrated progressively to a 5 final pressure of only 7 cmH2O.  The patient's apnea was not completely resolved but it was significantly reduced.  He was placed on an auto titration and I was able to review the last 30 days of use.  First of all I congratulated Richard Vaughn that he was able to use the machine every of these 30 days, and that he quit smoking.  His AutoSet is a 6 to 10 cm pressure window with 1 cm expiratory pressure relief and he has used it every day on average 6 hours and  19 minutes.  His residual apnea index however has been 9.2 with central apneas making up all of the residual apneas.  For this reason we need to reduce either the upper limit of his maximum pressure or try the patient on a BiPAP machine. I will reset the CPAP window 6 and 8 cm water - and if this fails will try BiPAP and ASV.      I have the pleasure of meeting today with Richard Vaughn, and meanwhile 68 year old gentleman that has been treated for strokes at [facility] in 2017, and had followed up with Dr. Leonie Man-  At the time he had been referred for a sleep study and a baseline  polysomnography have been performed this was on 05 October 2016 . He had suffered a stroke on 02 August 2015 before which left him with mild right dominant weakness clumsiness and hesitation in speech.  His wife had noted him to snore and have apneas at the time he was still struggling to stop smoking.  He has been sleepier than usual since her stroke in 2016 but had recovered very well in terms of motor function.  History of GERD, gastroparesis without diabetes, renal cyst, renal stones, back pain headache stroke esophagitis and gastritis have been mentioned.  His BMI at the time was 35.1, his AHI was 18.7 and mild to moderate degree of sleep apnea which was not exacerbated by snoring.  He slept poorly in the lab only 52% of the recorded time was in sleep.  His sleep apnea was strictly supine dependent.  While the patient slept on his back he suffered nearly all apneas of the night when not on his back his AHI was 3.4.  He did not have prolonged desaturations and he did not have nocturia.  He had primary central sleep apnea which could have been induced by narcotic pain medication at the time.  Since his sleep apnea was exacerbated when on his back the main treatment for him would be to sleep on his side.  CPAP was not the primary recommendation.  Unfortunately, the patient presented again with stroke symptoms of slurred speech and left-sided weakness, on 15 April 2018, less than 14 days ago.  He had a prior left basal ganglia stroke but these stroke symptoms affected the previous sleep better while working side.  Sudden onset of numbness affecting his lips slurring of speech and left-sided body weakness was noted.  EMS was called he was brought to [facility], he arrived at 12:45 AM on 15 April 2018.  He was to work awake and alert, the NIH stroke scale was 1 for sensory 1 for left arm 1 for left leg 2 for dysarthria total point of 5.  Had a normal CBC with differential, a normal metabolic panel, ( glucose was  high but the patient was not fasting).  Creatinine was normal.  Noncontrast CT of the head the same night showed a right basal ganglia bleed etiology most likely due to hypertension.  It was a nontraumatic hemorrhage.  The patient was discharged on 3 June by Dr. Collie Siad, he was asked to stop aspirin and start taking Nexium Benicar Opana had been continued, Viagra  wasnot discontinued. Trazodone has been continued.  Upon discharge his lipid panel shows very high triglycerides, the low level of good cholesterol HDL was only 20, total cholesterol however was not elevated at 112.  Carotid Doppler 30 Myotte bilateral ICA stenosis, vertebral flow was antegrade and intact, 2D echocardiogram on 2  June showed an ejection fraction between 65 and 70% excellent wall motion.  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  The patient had been placed on oxygen especially at night during his hospitalization hypoxemia and witnessed apnea with a reason to have him evaluated now.  His stroke neurologist , Dr Erlinda Hong, also wanted him to finally quit smoking.   Chief complaint according to patient : " My wife is worried about my breathing in sleep"  Sleep habits are as follows: The patient's usual bedtime is between midnight and 1 AM.  That is when he retreats to his bedroom, he is asleep within 5 to 10 minutes.  He prefers to fall asleep on his side, on 2 pillows.  His wife reports that she wakes up because he puts the TV on in the bedroom.  The marital bedroom is described as cool, but neither quiet nor dark. He stays asleep for about 4 to 5 hours before waking up for bathroom break, another 2 hours later. He ralls no dreams, is not enacting any dreams. He wakes up spontaneously around 8 AM, estimated average sleep time  Is 6-7 hours.    Sleep medical history and family sleep history: see above .  sister has OSA.  No tonsillectomy , no nasal surgery, he had nasal septum deviation due to 2 nasal fracture. .   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 Still active. 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 Caffeine -5 iced teas a day, "decaff" , no soda, and not Coffee.  ETOH    Observations/Objective:  Generalized: Well developed, in no acute distress  Mentation: Alert oriented to time, place, history taking. Follows all commands speech and language fluent   Assessment and Plan:  68 y.o. year old male  has a past medical history of Barrett's esophagus, Chronic back pain, Gastritis, Gastroparesis, GERD (gastroesophageal reflux disease), Headache, Hyperglycemia, Hypertension, Nephrolithiasis, Renal cyst, Stricture and stenosis of esophagus, Stroke ([place]) (07-2015), and Vision abnormalities. here with    ICD-10-CM   1. 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 Obstructive sleep apnea G47.33     Richard Vaughn is doing well with CPAP therapy.  He continues to show optimal compliance. Unfortunately his AHI is elevated as well as central apnea events. We will increase maximum pressure from 8 to 9 and set EPR at 2. Order placed by Gerline Legacy, RN. He was encouraged to continue using Cpap nightly and for greater than 4 hours each night. We will follow up with him in 2 months to reevaluate data report with new settings. Patient verbalizes understanding and agreement with plan.   No orders of the defined types were placed in this encounter.  No orders of the defined types were placed in this encounter.    Follow Up Instructions:  I discussed the assessment and treatment plan with the patient. The patient was provided an opportunity to ask questions and all were answered. The patient agreed with the plan and demonstrated an understanding of the instructions.   The patient was advised to call back or seek an in-person evaluation  if the symptoms worsen or if the condition fails to improve as anticipated.  I provided 30 minutes of non-face-to-face time during this encounter. Patient is located at his place of residence. Provider at her place of  residence.    Debbora Presto, NP

## 2019-02-21 ENCOUNTER — Other Ambulatory Visit: Payer: Self-pay | Admitting: Neurology

## 2019-02-21 ENCOUNTER — Encounter: Payer: Self-pay | Admitting: Family Medicine

## 2019-02-21 DIAGNOSIS — G4737 Central sleep apnea in conditions classified elsewhere: Secondary | ICD-10-CM

## 2019-02-21 DIAGNOSIS — G4731 Primary central sleep apnea: Secondary | ICD-10-CM

## 2019-02-21 DIAGNOSIS — G4733 Obstructive sleep apnea (adult) (pediatric): Secondary | ICD-10-CM

## 2019-02-21 DIAGNOSIS — I69398 Other sequelae of cerebral infarction: Secondary | ICD-10-CM

## 2019-02-21 DIAGNOSIS — G4739 Other sleep apnea: Secondary | ICD-10-CM

## 2019-03-27 ENCOUNTER — Telehealth: Payer: Self-pay | Admitting: Family Medicine

## 2019-03-27 NOTE — Telephone Encounter (Signed)
Left message on pt's VM req a call bk to sched 2 month follow up with Amy (around 04/22/2019)

## 2019-04-23 NOTE — Telephone Encounter (Signed)
Done

## 2019-05-15 ENCOUNTER — Encounter: Payer: Self-pay | Admitting: Speech Pathology

## 2019-05-15 NOTE — Therapy (Signed)
SPEECH THERAPY DISCHARGE SUMMARY  Visits from Start of Care: 12  Current functional level related to goals / functional outcomes: Occasional min A for compensations for dysarthria in conversation. See goals below   Remaining deficits: Mild dysarthria   Education / Equipment: Compensations for dysarthria  Plan: Patient agrees to discharge.  Patient goals were partially met. Patient is being discharged due to not returning since the last visit.  ?????         SLP Short Term Goals - 05/15/19 1028      SLP SHORT TERM GOAL #1   Title  pt will complete HEP with occasional min A     Status  Achieved      SLP SHORT TERM GOAL #2   Title  pt will demo compensations for dysarthria in mod complex sentence responses 90%     Status  Not Met      SLP SHORT TERM GOAL #3   Title  pt will produce 100% intelligible speech in 8 minutes simple conversation with modified independence (compensations)    Status  Not Met      SLP Long Term Goals - 05/15/19 1028      SLP LONG TERM GOAL #1   Title  Pt will report completing HEP 4/5 days.     Status  Achieved      SLP LONG TERM GOAL #2   Title  pt will produce 10 minutes intelligible mod complex conversation using compensations, with rare min A over 3 consecutive sessions    Status  Partially Met      SLP LONG TERM GOAL #3   Title  pt will demo speech volume in 10 minutes simple-mod complex conversation, in low 70s dB average over three consecutive sessions    Status  Partially [place], [place], [place] [address] [place], [place], [address] Phone: [phone]   Fax:  [phone]  Patient Details  Name: Richard Vaughn MRN: [id number] Date of Birth: Jul 05, 1951 Referring Provider:  No ref. provider found  Encounter Date: 05/15/2019   Aliene Altes 05/15/2019, 10:29 AM  [facility] [address] [place], [place], [address] Phone: [phone]   Fax:  [phone]

## 2019-05-22 ENCOUNTER — Other Ambulatory Visit: Payer: Self-pay

## 2019-05-22 ENCOUNTER — Telehealth: Payer: Self-pay | Admitting: Gastroenterology

## 2019-05-22 DIAGNOSIS — R6881 Early satiety: Secondary | ICD-10-CM

## 2019-05-22 MED ORDER — NEXIUM 40 MG PO CPDR: 40.0000 mg | DELAYED_RELEASE_CAPSULE | Freq: Two times a day (BID) | ORAL | 5 refills | Status: DC

## 2019-05-22 NOTE — Progress Notes (Signed)
90 day script sent to pharmacy

## 2019-05-22 NOTE — Telephone Encounter (Signed)
Patient called and wanted to know if he can get 90day prescription instead of 30day due to insurance?

## 2019-05-23 ENCOUNTER — Ambulatory Visit (INDEPENDENT_AMBULATORY_CARE_PROVIDER_SITE_OTHER): Payer: Medicare Other | Admitting: Family Medicine

## 2019-05-23 ENCOUNTER — Other Ambulatory Visit: Payer: Self-pay

## 2019-05-23 ENCOUNTER — Encounter: Payer: Self-pay | Admitting: Family Medicine

## 2019-05-23 DIAGNOSIS — R197 Diarrhea, unspecified: Secondary | ICD-10-CM

## 2019-05-23 DIAGNOSIS — I2510 Atherosclerotic heart disease of native coronary artery without angina pectoris: Secondary | ICD-10-CM | POA: Diagnosis not present

## 2019-05-23 NOTE — Progress Notes (Signed)
Virtual Visit via Video Note   I connected with [name] with his daughter on 05/23/19 at  9:45 AM EDT by a video enabled telemedicine application and verified that I am speaking with the correct person using two identifiers.  Location patient: home Location provider:home office Persons participating in the virtual visit: patient, provider  I discussed the limitations of evaluation and management by telemedicine and the availability of in person appointments. The patient expressed understanding and agreed to proceed.   HPI: [name] is a 68 yo male with Hx of HTN,chronic pain,CVA, and OSA among some c/o 3 days of watery stools. Today x 3. Yesterday at least 6.  He denies fever, chills, unusual fatigue, body aches, cough, dyspnea, wheezing, CP, abdominal pain, nausea, vomiting, skin rash, or urinary symptoms. No anosmia or ageusia.  Negative for blood or mucus in the stool. He is taking OTC Imodium   No sick contact. He has not been on abx recently.  Problem seems to be stable. Colonoscopy in 12/2014 diverticulosis and sessile polyp removed.  ROS: See pertinent positives and negatives per HPI.  Past Medical History:  Diagnosis Date  . Barrett's esophagus   . Chronic back pain   . Gastritis    Mild  . Gastroparesis   . GERD (gastroesophageal reflux disease)   . Headache   . Hyperglycemia   . Hypertension   . Nephrolithiasis   . Renal cyst   . Stricture and stenosis of esophagus   . Stroke ([place]) 07-2015  . Vision abnormalities     Past Surgical History:  Procedure Laterality Date  . [place] SURGERY  2012  . COLONOSCOPY    . [place] SURGERY  2005, 2010   replaced L4 and L5  . UPPER GASTROINTESTINAL ENDOSCOPY      Family History  Problem Relation Age of Onset  . Hypertension Father   . Stroke Father   . Hypertension Mother   . Liver cancer Mother   . Hypertension Sister        2 other sisters has also  . Stroke Sister   . Stroke Sister   .  Hypertension Brother        2nd brother has also  . Colon cancer Neg Hx   . Esophageal cancer Neg Hx   . Rectal cancer Neg Hx   . Stomach cancer Neg Hx   . Colon polyps Neg Hx     Social History   Socioeconomic History  . Marital status: Married    Spouse name: Not on file  . Number of children: 3  . Years of education: Not on file  . Highest education level: Not on file  Occupational History  . Occupation: retired    Fish farm manager: [employer]  Social Needs  . Financial resource strain: Not on file  . Food insecurity    Worry: Not on file    Inability: Not on file  . Transportation needs    Medical: Not on file    Non-medical: Not on file  Tobacco Use  . Smoking status: Former Smoker    Packs/day: 0.50    Years: 30.00    Pack years: 15.00    Types: Cigarettes    Quit date: 04/15/2018    Years since quitting: 1.1  . Smokeless tobacco: Never Used  Substance and Sexual Activity  . Alcohol use: No    Alcohol/week: 0.0 standard drinks  . Drug use: Yes    Types: Oxycodone, Morphine  Comment: last use 04/2018  . Sexual activity: Yes  Lifestyle  . Physical activity    Days per week: Not on file    Minutes per session: Not on file  . Stress: Not on file  Relationships  . Social Herbalist on phone: Not on file    Gets together: Not on file    Attends religious service: Not on file    Active member of club or organization: Not on file    Attends meetings of clubs or organizations: Not on file    Relationship status: Not on file  . Intimate partner violence    Fear of current or ex partner: Not on file    Emotionally abused: Not on file    Physically abused: Not on file    Forced sexual activity: Not on file  Other Topics Concern  . Not on file  Social History Narrative  . Not on file      Current Outpatient Medications:  .  amLODipine (NORVASC) 2.5 MG tablet, Take 1 tablet (2.5 mg total) by mouth daily., Disp: 90 tablet, Rfl: 1 .  aspirin 81 MG  tablet, Take 1 tablet (81 mg total) by mouth daily., Disp: 30 tablet, Rfl:  .  NEXIUM 40 MG capsule, Take 1 capsule (40 mg total) by mouth 2 (two) times daily before a meal., Disp: 180 capsule, Rfl: 5 .  olmesartan (BENICAR) 20 MG tablet, Take 1 tablet (20 mg total) by mouth daily., Disp: 90 tablet, Rfl: 1 .  oxymorphone (OPANA) 10 MG tablet, Take 10 mg by mouth every 6 (six) hours as needed for pain. , Disp: , Rfl:  .  traZODone (DESYREL) 50 MG tablet, Take 1 tablet (50 mg total) by mouth at bedtime., Disp: 90 tablet, Rfl: 1  Current Facility-Administered Medications:  .  0.9 %  sodium chloride infusion, 500 mL, Intravenous, Continuous, Armbruster, Carlota Raspberry, MD  EXAM:  VITALS per patient if applicable:N/A  GENERAL: alert, oriented, appears well and in no acute distress  HEENT: atraumatic, normocephalic,conjunttiva clear.  LUNGS: on inspection no signs of respiratory distress, breathing rate appears normal, no obvious gross SOB, gasping or wheezing  CV: no obvious cyanosis  MS: Standing up with his walker.  PSYCH/NEURO: pleasant and cooperative, no obvious depression or anxiety, speech and thought processing grossly intact  ASSESSMENT AND PLAN:  Discussed the following assessment and plan:  Diarrhea, unspecified type - Plan: Most likely viral etiology, so symptomatic treatment recommended for now.Other possible causes discussed but at this time I do not think further work-up is necessary.  OTC Imodium could be continued but if unless 6 or more stools daily. Oral hydration, Gatorade or Pedialyte are good options. Bland and light diet if tolerated. Good hand hygiene and CDC recommendations in regard to social distancing and mask use. Clearly instructed about warning signs. F/U as needed.     I discussed the assessment and treatment plan with the patient. He was provided an opportunity to ask questions and all were answered. He agreed with the plan and demonstrated an  understanding of the instructions.   The patient was advised to call back or seek an in-person evaluation if the symptoms worsen or if the condition fails to improve as anticipated.  Return if symptoms worsen or fail to improve.    Richard Rama Martinique, MD

## 2019-07-08 ENCOUNTER — Other Ambulatory Visit: Payer: Self-pay | Admitting: Family Medicine

## 2019-07-08 DIAGNOSIS — G4700 Insomnia, unspecified: Secondary | ICD-10-CM

## 2019-07-11 ENCOUNTER — Other Ambulatory Visit: Payer: Self-pay

## 2019-07-11 ENCOUNTER — Ambulatory Visit (INDEPENDENT_AMBULATORY_CARE_PROVIDER_SITE_OTHER): Payer: Medicare Other | Admitting: Family Medicine

## 2019-07-11 ENCOUNTER — Encounter: Payer: Self-pay | Admitting: Family Medicine

## 2019-07-11 VITALS — BP 120/78 | HR 60 | Temp 97.5°F | Resp 12 | Ht 74.0 in | Wt 261.4 lb

## 2019-07-11 DIAGNOSIS — I674 Hypertensive encephalopathy: Secondary | ICD-10-CM | POA: Diagnosis not present

## 2019-07-11 DIAGNOSIS — I10 Essential (primary) hypertension: Secondary | ICD-10-CM

## 2019-07-11 DIAGNOSIS — F17200 Nicotine dependence, unspecified, uncomplicated: Secondary | ICD-10-CM

## 2019-07-11 DIAGNOSIS — I2510 Atherosclerotic heart disease of native coronary artery without angina pectoris: Secondary | ICD-10-CM

## 2019-07-11 DIAGNOSIS — G4700 Insomnia, unspecified: Secondary | ICD-10-CM

## 2019-07-11 LAB — BASIC METABOLIC PANEL
BUN: 14 mg/dL (ref 6–23)
CO2: 26 mEq/L (ref 19–32)
Calcium: 9.2 mg/dL (ref 8.4–10.5)
Chloride: 105 mEq/L (ref 96–112)
Creatinine, Ser: 1 mg/dL (ref 0.40–1.50)
GFR: 90.55 mL/min (ref >60.00)
Glucose, Bld: 93 mg/dL (ref 70–99)
Potassium: 4.2 mEq/L (ref 3.5–5.1)
Sodium: 139 mEq/L (ref 135–145)

## 2019-07-11 NOTE — Patient Instructions (Signed)
A few things to remember from today's visit:   Insomnia, unspecified type  TOBACCO ABUSE  Essential hypertension - Plan: Basic metabolic panel  CVD (cardiovascular disease)  No changes today. Continue monitoring blood pressure and let me know if top number is on the lower 100s frequently. Please be sure medication list is accurate. If a new problem present, please set up appointment sooner than planned today.

## 2019-07-11 NOTE — Progress Notes (Signed)
HPI:   Richard Vaughn is a 68 y.o. male, who is here today for chronic disease management.  He was last seen on 05/23/2019, when he was complaining about diarrhea.  Problem resolved No new problems since his last visit. He has followed-up with gastroenterologist recently for GERD and Barrett's esophagus.  He wonders for how long he will be having left upper extremity skin sensation (can not describe) that started after his last stroke. He is no longer using a cane or walker. Currently he is on Aspirin 81 mg daily.  He is no longer following with neurologist.  Still following with pain management for chronic pain.  He went back to smoking.  Insomnia: Currently he is on trazodone 50 mg daily. Since his last stroke he is waking up a few times through the night. Sleeping about 4 to 5 hours.  He would like to go through last lipid panel results. He stopped atorvastatin 20 mg, no side effects but he did not want to take medication.  Lab Results  Component Value Date   CHOL 133 01/10/2019   HDL 34.10 (L) 01/10/2019   LDLCALC 81 01/10/2019   TRIG 88.0 01/10/2019   CHOLHDL 4 01/10/2019   Hypertension: Currently he is on amlodipine 2.5 mg daily and Benicar 20 mg daily. He is checking BP at home, usually 120s/80s.,sometiems lower, 105/78.  Denies severe/frequent headache, visual changes, chest pain, dyspnea, palpitation, claudication, new focal weakness, or edema.  OSA wearing CPAP.  Component     Latest Ref Rng & Units 01/10/2019          Sodium     135 - 145 mEq/L 140  Potassium     3.5 - 5.1 mEq/L 4.3  Chloride     96 - 112 mEq/L 105  CO2     19 - 32 mEq/L 28  Glucose     70 - 99 mg/dL 87  BUN     6 - 23 mg/dL 15  Creatinine     0.40 - 1.50 mg/dL 1.05  Calcium     8.4 - 10.5 mg/dL 9.3  GFR, Est Non African American     >60 mL/min   GFR, Est African American     >60 mL/min   Anion gap     5 - 15    Review of Systems  Constitutional: Negative for  appetite change, chills and fever.  HENT: Negative for mouth sores, nosebleeds and sore throat.   Respiratory: Negative for cough and wheezing.   Gastrointestinal: Negative for abdominal pain, diarrhea and vomiting.  Endocrine: Negative for cold intolerance, heat intolerance, polydipsia, polyphagia and polyuria.  Genitourinary: Negative for decreased urine volume, dysuria and hematuria.  Musculoskeletal: Positive for arthralgias, back pain and gait problem.  Neurological: Negative for syncope and facial asymmetry.  Psychiatric/Behavioral: Negative for confusion and hallucinations.  Rest see pertinent positives and negatives per HPI.   Current Outpatient Medications on File Prior to Visit  Medication Sig Dispense Refill  . aspirin 81 MG tablet Take 1 tablet (81 mg total) by mouth daily. 30 tablet   . NEXIUM 40 MG capsule Take 1 capsule (40 mg total) by mouth 2 (two) times daily before a meal. 180 capsule 5  . oxymorphone (OPANA) 10 MG tablet Take 10 mg by mouth every 6 (six) hours as needed for pain.     . traZODone (DESYREL) 50 MG tablet TAKE 1 TABLET AT BEDTIME   NEEDS FOLLOW-UP APPOINTMENTBEFORE MORE REFILLS ARE  AUTHORIZED 90 tablet 2   Current Facility-Administered Medications on File Prior to Visit  Medication Dose Route Frequency Provider Last Rate Last Dose  . 0.9 %  sodium chloride infusion  500 mL Intravenous Continuous Armbruster, Carlota Raspberry, MD         Past Medical History:  Diagnosis Date  . Barrett's esophagus   . Chronic back pain   . Gastritis    Mild  . Gastroparesis   . GERD (gastroesophageal reflux disease)   . Headache   . Hyperglycemia   . Hypertension   . Nephrolithiasis   . Renal cyst   . Stricture and stenosis of esophagus   . Stroke ([place]) 07-2015  . Vision abnormalities    No Known Allergies  Social History   Socioeconomic History  . Marital status: Married    Spouse name: Not on file  . Number of children: 3  . Years of education: Not on file   . Highest education level: Not on file  Occupational History  . Occupation: retired    Fish farm manager: [employer]  Social Needs  . Financial resource strain: Not on file  . Food insecurity    Worry: Not on file    Inability: Not on file  . Transportation needs    Medical: Not on file    Non-medical: Not on file  Tobacco Use  . Smoking status: Former Smoker    Packs/day: 0.50    Years: 30.00    Pack years: 15.00    Types: Cigarettes    Quit date: 04/15/2018    Years since quitting: 1.2  . Smokeless tobacco: Never Used  Substance and Sexual Activity  . Alcohol use: No    Alcohol/week: 0.0 standard drinks  . Drug use: Yes    Types: Oxycodone, Morphine    Comment: last use 04/2018  . Sexual activity: Yes  Lifestyle  . Physical activity    Days per week: Not on file    Minutes per session: Not on file  . Stress: Not on file  Relationships  . Social Herbalist on phone: Not on file    Gets together: Not on file    Attends religious service: Not on file    Active member of club or organization: Not on file    Attends meetings of clubs or organizations: Not on file    Relationship status: Not on file  Other Topics Concern  . Not on file  Social History Narrative  . Not on file    Vitals:   07/11/19 0859  BP: 120/78  Pulse: 60  Resp: 12  Temp: (!) 97.5 F (36.4 C)  SpO2: 97%   Body mass index is 33.56 kg/m.  Physical Exam  Nursing note reviewed. Constitutional: He is oriented to person, place, and time. He appears well-developed. No distress.  HENT:  Head: Normocephalic and atraumatic.  Mouth/Throat: Oropharynx is clear and moist and mucous membranes are normal.  Eyes: Pupils are equal, round, and reactive to light. Conjunctivae are normal.  Cardiovascular: Normal rate and regular rhythm.  No murmur heard. PT pulses present bilateral.  Respiratory: Effort normal and breath sounds normal. No respiratory distress.  GI: Soft. He exhibits no mass. There  is no hepatomegaly. There is no abdominal tenderness.  Musculoskeletal:        General: No edema.  Lymphadenopathy:    He has no cervical adenopathy.  Neurological: He is alert and oriented to person, place, and time. He displays  no tremor. No cranial nerve deficit.  Upper extremities strength 5/5, no focal deficit appreciated. Mild dysarthria. Mildly unstable, not assisted.  Skin: Skin is warm. No rash noted. No erythema.  Psychiatric: He has a normal mood and affect.  Well groomed, good eye contact.   ASSESSMENT AND PLAN:  Mr. Labryant was seen today for follow-up.  Diagnoses and all orders for this visit:  Lab Results  Component Value Date   CREATININE 1.00 07/11/2019   BUN 14 07/11/2019   NA 139 07/11/2019   K 4.2 07/11/2019   CL 105 07/11/2019   CO2 26 07/11/2019    Insomnia, unspecified type Problem is not as well-controlled as it was before recent stroke. I prefer not to increase dose of trazodone for now, continue 50 mg at bedtime. Good sleep hygiene. He could add Melatonin 3-5 mg.  TOBACCO ABUSE Strongly recommend smoking cessation. He understands adverse effects of tobacco use and great benefits of smoking cessation.  Essential hypertension BP adequately controlled. No changes in current management. Continue low-salt diet. Eye exam at least once per year.  OSA, continue wearing CPAP.  -     Basic metabolic panel  CVD (cardiovascular disease) Continue Aspirin 81 mg daily. Currently he is not on a statin medication.  BP needs to be adequately controlled. Strongly recommend smoking cessation.   Return in about 6 months (around 01/11/2020) for f/u.   -Mr. CIMARRON PUFAHL was advised to return sooner than planned today if new concerns arise.    G. Martinique, MD  [facility]. [place] office.

## 2019-07-14 ENCOUNTER — Encounter: Payer: Self-pay | Admitting: Family Medicine

## 2019-07-14 MED ORDER — AMLODIPINE BESYLATE 2.5 MG PO TABS: 2.5000 mg | ORAL_TABLET | Freq: Every day | ORAL | 2 refills | Status: DC

## 2019-07-14 MED ORDER — OLMESARTAN MEDOXOMIL 20 MG PO TABS: 20.0000 mg | ORAL_TABLET | Freq: Every day | ORAL | 2 refills | Status: DC

## 2019-08-21 ENCOUNTER — Encounter: Payer: Self-pay | Admitting: Family Medicine

## 2019-08-21 ENCOUNTER — Other Ambulatory Visit: Payer: Self-pay

## 2019-08-21 ENCOUNTER — Ambulatory Visit (INDEPENDENT_AMBULATORY_CARE_PROVIDER_SITE_OTHER): Payer: Medicare Other | Admitting: Family Medicine

## 2019-08-21 VITALS — BP 130/84 | HR 60 | Temp 97.6°F | Resp 16 | Ht 74.0 in | Wt 261.1 lb

## 2019-08-21 DIAGNOSIS — M19049 Primary osteoarthritis, unspecified hand: Secondary | ICD-10-CM | POA: Diagnosis not present

## 2019-08-21 DIAGNOSIS — I2510 Atherosclerotic heart disease of native coronary artery without angina pectoris: Secondary | ICD-10-CM | POA: Diagnosis not present

## 2019-08-21 DIAGNOSIS — M25541 Pain in joints of right hand: Secondary | ICD-10-CM | POA: Diagnosis not present

## 2019-08-21 NOTE — Patient Instructions (Signed)
A few things to remember from today's visit:   Hand arthritis   Osteoarthritis  Osteoarthritis is a type of arthritis that affects tissue that covers the ends of bones in joints (cartilage). Cartilage acts as a cushion between the bones and helps them move smoothly. Osteoarthritis results when cartilage in the joints gets worn down. Osteoarthritis is sometimes called "wear and tear" arthritis. Osteoarthritis is the most common form of arthritis. It often occurs in older people. It is a condition that gets worse over time (a progressive condition). Joints that are most often affected by this condition are in:  Fingers.  Toes.  Hips.  Knees.  Spine, including neck and lower back. What are the causes? This condition is caused by age-related wearing down of cartilage that covers the ends of bones. What increases the risk? The following factors may make you more likely to develop this condition:  Older age.  Being overweight or obese.  Overuse of joints, such as in athletes.  Past injury of a joint.  Past surgery on a joint.  Family history of osteoarthritis. What are the signs or symptoms? The main symptoms of this condition are pain, swelling, and stiffness in the joint. The joint may lose its shape over time. Small pieces of bone or cartilage may break off and float inside of the joint, which may cause more pain and damage to the joint. Small deposits of bone (osteophytes) may grow on the edges of the joint. Other symptoms may include:  A grating or scraping feeling inside the joint when you move it.  Popping or creaking sounds when you move. Symptoms may affect one or more joints. Osteoarthritis in a major joint, such as your knee or hip, can make it painful to walk or exercise. If you have osteoarthritis in your hands, you might not be able to grip items, twist your hand, or control small movements of your hands and fingers (fine motor skills). How is this diagnosed? This  condition may be diagnosed based on:  Your medical history.  A physical exam.  Your symptoms.  X-rays of the affected joint(s).  Blood tests to rule out other types of arthritis. How is this treated? There is no cure for this condition, but treatment can help to control pain and improve joint function. Treatment plans may include:  A prescribed exercise program that allows for rest and joint relief. You may work with a physical therapist.  A weight control plan.  Pain relief techniques, such as: ? Applying heat and cold to the joint. ? Electric pulses delivered to nerve endings under the skin (transcutaneous electrical nerve stimulation, or TENS). ? Massage. ? Certain nutritional supplements.  NSAIDs or prescription medicines to help relieve pain.  Medicine to help relieve pain and inflammation (corticosteroids). This can be given by mouth (orally) or as an injection.  Assistive devices, such as a brace, wrap, splint, specialized glove, or cane.  Surgery, such as: ? An osteotomy. This is done to reposition the bones and relieve pain or to remove loose pieces of bone and cartilage. ? Joint replacement surgery. You may need this surgery if you have very bad (advanced) osteoarthritis. Follow these instructions at home: Activity  Rest your affected joints as directed by your health care provider.  Do not drive or use heavy machinery while taking prescription pain medicine.  Exercise as directed. Your health care provider or physical therapist may recommend specific types of exercise, such as: ? Strengthening exercises. These are done to strengthen  the muscles that support joints that are affected by arthritis. They can be performed with weights or with exercise bands to add resistance. ? Aerobic activities. These are exercises, such as brisk walking or water aerobics, that get your heart pumping. ? Range-of-motion activities. These keep your joints easy to move. ? Balance and  agility exercises. Managing pain, stiffness, and swelling      If directed, apply heat to the affected area as often as told by your health care provider. Use the heat source that your health care provider recommends, such as a moist heat pack or a heating pad. ? If you have a removable assistive device, remove it as told by your health care provider. ? Place a towel between your skin and the heat source. If your health care provider tells you to keep the assistive device on while you apply heat, place a towel between the assistive device and the heat source. ? Leave the heat on for 20-30 minutes. ? Remove the heat if your skin turns bright red. This is especially important if you are unable to feel pain, heat, or cold. You may have a greater risk of getting burned.  If directed, put ice on the affected joint: ? If you have a removable assistive device, remove it as told by your health care provider. ? Put ice in a plastic bag. ? Place a towel between your skin and the bag. If your health care provider tells you to keep the assistive device on during icing, place a towel between the assistive device and the bag. ? Leave the ice on for 20 minutes, 2-3 times a day. General instructions  Take over-the-counter and prescription medicines only as told by your health care provider.  Maintain a healthy weight. Follow instructions from your health care provider for weight control. These may include dietary restrictions.  Do not use any products that contain nicotine or tobacco, such as cigarettes and e-cigarettes. These can delay bone healing. If you need help quitting, ask your health care provider.  Use assistive devices as directed by your health care provider.  Keep all follow-up visits as told by your health care provider. This is important. Where to find more information  [employer] of Arthritis and Musculoskeletal and Skin Diseases: www.niams.SouthExposed.es  [employer] on  Aging: http://kim-miller.com/  American College of Rheumatology: www.rheumatology.org Contact a health care provider if:  Your skin turns red.  You develop a rash.  You have pain that gets worse.  You have a fever along with joint or muscle aches. Get help right away if:  You lose a lot of weight.  You suddenly lose your appetite.  You have night sweats. Summary  Osteoarthritis is a type of arthritis that affects tissue covering the ends of bones in joints (cartilage).  This condition is caused by age-related wearing down of cartilage that covers the ends of bones.  The main symptom of this condition is pain, swelling, and stiffness in the joint.  There is no cure for this condition, but treatment can help to control pain and improve joint function. This information is not intended to replace advice given to you by your health care provider. Make sure you discuss any questions you have with your health care provider. Document Released: 11/01/2005 Document Revised: 10/14/2017 Document Reviewed: 07/05/2016 Elsevier Patient Education  2020 [employer].  Please be sure medication list is accurate. If a new problem present, please set up appointment sooner than planned today.

## 2019-08-21 NOTE — Progress Notes (Signed)
HPI:  Chief Complaint  Patient presents with  . Joint Swelling    right thumb joint swelling and pain started about 1 month ago    Richard Vaughn is a 68 y.o. male, who is here today complaining of 3 to 4 weeks of right hand joint pain, first MCP and IP. Achy/sharp pain, intermittent, 7/10. + Stiffness.  No history of trauma or unusual/repetitive activities that involve thumbs/wrist.  He has not noted erythema or edema. No limitation of range of motion. He has history of shoulder OA and chronic back pain, he is on chronic opioid treatment.   Pain is exacerbated by movement and palpation, alleviated by rest. He has not tried OTC medications.  Negative for fever, chills, focal deficit, numbness, tingling, or deformities.  He is right handed.  Review of Systems  Respiratory: Negative for cough, shortness of breath and wheezing.   Skin: Negative for pallor and rash.  Neurological: Negative for syncope and headaches.  Rest see pertinent positives and negatives per HPI.   Current Outpatient Medications on File Prior to Visit  Medication Sig Dispense Refill  . amLODipine (NORVASC) 2.5 MG tablet Take 1 tablet (2.5 mg total) by mouth daily. 90 tablet 2  . aspirin 81 MG tablet Take 1 tablet (81 mg total) by mouth daily. 30 tablet   . NEXIUM 40 MG capsule Take 1 capsule (40 mg total) by mouth 2 (two) times daily before a meal. 180 capsule 5  . olmesartan (BENICAR) 20 MG tablet Take 1 tablet (20 mg total) by mouth daily. 90 tablet 2  . oxymorphone (OPANA) 10 MG tablet Take 10 mg by mouth every 6 (six) hours as needed for pain.     . traZODone (DESYREL) 50 MG tablet TAKE 1 TABLET AT BEDTIME   NEEDS FOLLOW-UP APPOINTMENTBEFORE MORE REFILLS ARE    AUTHORIZED 90 tablet 2   Current Facility-Administered Medications on File Prior to Visit  Medication Dose Route Frequency Provider Last Rate Last Dose  . 0.9 %  sodium chloride infusion  500 mL Intravenous Continuous  Armbruster, Carlota Raspberry, MD         Past Medical History:  Diagnosis Date  . Barrett's esophagus   . Chronic back pain   . Gastritis    Mild  . Gastroparesis   . GERD (gastroesophageal reflux disease)   . Headache   . Hyperglycemia   . Hypertension   . Nephrolithiasis   . Renal cyst   . Stricture and stenosis of esophagus   . Stroke ([place]) 07-2015  . Vision abnormalities    No Known Allergies  Social History   Socioeconomic History  . Marital status: Married    Spouse name: Not on file  . Number of children: 3  . Years of education: Not on file  . Highest education level: Not on file  Occupational History  . Occupation: retired    Fish farm manager: [employer]  Social Needs  . Financial resource strain: Not on file  . Food insecurity    Worry: Not on file    Inability: Not on file  . Transportation needs    Medical: Not on file    Non-medical: Not on file  Tobacco Use  . Smoking status: Former Smoker    Packs/day: 0.50    Years: 30.00    Pack years: 15.00    Types: Cigarettes    Quit date: 04/15/2018    Years since quitting: 1.3  . Smokeless  tobacco: Never Used  Substance and Sexual Activity  . Alcohol use: No    Alcohol/week: 0.0 standard drinks  . Drug use: Yes    Types: Oxycodone, Morphine    Comment: last use 04/2018  . Sexual activity: Yes  Lifestyle  . Physical activity    Days per week: Not on file    Minutes per session: Not on file  . Stress: Not on file  Relationships  . Social Herbalist on phone: Not on file    Gets together: Not on file    Attends religious service: Not on file    Active member of club or organization: Not on file    Attends meetings of clubs or organizations: Not on file    Relationship status: Not on file  Other Topics Concern  . Not on file  Social History Narrative  . Not on file    Vitals:   08/21/19 0731  BP: 130/84  Pulse: 60  Resp: 16  Temp: 97.6 F (36.4 C)  SpO2: 98%   Body mass index is 33.53  kg/m.  Physical Exam  Nursing note and vitals reviewed. Constitutional: He is oriented to person, place, and time. He appears well-developed. He does not appear ill. No distress.  HENT:  Head: Normocephalic and atraumatic.  Eyes: Conjunctivae are normal.  Cardiovascular: Normal rate and regular rhythm.  Pulses:      Radial pulses are 2+ on the right side.  Respiratory: Effort normal. No respiratory distress.  Musculoskeletal:        General: No edema.     Right wrist: He exhibits normal range of motion, no tenderness, no bony tenderness and no effusion.     Right hand: He exhibits tenderness. He exhibits normal range of motion, normal capillary refill and no deformity. Normal sensation noted. Normal strength noted.     Comments: Tenderness upon palpation of right 1st MCP and IP thumb joint. No edema or erythema. No limitation of ROM.  Neurological: He is alert and oriented to person, place, and time. He has normal strength.  Skin: Skin is warm. No rash noted. No erythema.  Psychiatric: He has a normal mood and affect.  Well groomed,good eye contact.   ASSESSMENT AND PLAN:  Richard Vaughn was seen today for joint swelling.  Diagnoses and all orders for this visit:  Metacarpophalangeal joint pain, right  Hand arthritis   We discussed possible etiologies. Most likely OA. Because no history of trauma I do not think imaging is needed at this time but it was offered, he agrees with holding on x-ray today. Sample of Voltaren gel was given. Try to avoid activities that aggravate pain. Follow-up as needed.  Return if symptoms worsen or fail to improve.    Reighlyn Elmes G. Martinique, MD  [facility] [place] office.

## 2019-10-05 ENCOUNTER — Other Ambulatory Visit: Payer: Self-pay

## 2019-10-05 DIAGNOSIS — Z20822 Contact with and (suspected) exposure to covid-19: Secondary | ICD-10-CM

## 2019-10-05 DIAGNOSIS — Z20828 Contact with and (suspected) exposure to other viral communicable diseases: Secondary | ICD-10-CM | POA: Diagnosis not present

## 2019-10-08 LAB — NOVEL CORONAVIRUS, NAA: SARS-CoV-2, NAA: NOT DETECTED

## 2019-12-05 DIAGNOSIS — N401 Enlarged prostate with lower urinary tract symptoms: Secondary | ICD-10-CM | POA: Diagnosis not present

## 2019-12-05 DIAGNOSIS — N5201 Erectile dysfunction due to arterial insufficiency: Secondary | ICD-10-CM | POA: Diagnosis not present

## 2019-12-05 DIAGNOSIS — R351 Nocturia: Secondary | ICD-10-CM | POA: Diagnosis not present

## 2019-12-20 ENCOUNTER — Other Ambulatory Visit: Payer: Self-pay

## 2019-12-20 DIAGNOSIS — D229 Melanocytic nevi, unspecified: Secondary | ICD-10-CM | POA: Diagnosis not present

## 2019-12-20 DIAGNOSIS — L821 Other seborrheic keratosis: Secondary | ICD-10-CM | POA: Diagnosis not present

## 2019-12-20 DIAGNOSIS — D485 Neoplasm of uncertain behavior of skin: Secondary | ICD-10-CM | POA: Diagnosis not present

## 2020-01-05 ENCOUNTER — Ambulatory Visit: Payer: Federal, State, Local not specified - PPO | Attending: Internal Medicine

## 2020-01-05 DIAGNOSIS — Z23 Encounter for immunization: Secondary | ICD-10-CM | POA: Insufficient documentation

## 2020-01-05 NOTE — Progress Notes (Signed)
   Covid-19 Vaccination Clinic  Name:  Richard Vaughn    MRN: [id number] DOB: 02/03/1951  01/05/2020  [name] was observed post Covid-19 immunization for 15 minutes without incidence. He was provided with Vaccine Information Sheet and instruction to access the V-Safe system.   [name] was instructed to call 911 with any severe reactions post vaccine: Marland Kitchen Difficulty breathing  . Swelling of your face and throat  . A fast heartbeat  . A bad rash all over your body  . Dizziness and weakness    Immunizations Administered    Name Date Dose VIS Date Route   Pfizer COVID-19 Vaccine 01/05/2020 11:52 AM 0.3 mL 10/26/2019 Intramuscular   Manufacturer: [place]   Lot: [id number]   [place]: [id number]

## 2020-01-14 DIAGNOSIS — M67912 Unspecified disorder of synovium and tendon, left shoulder: Secondary | ICD-10-CM | POA: Diagnosis not present

## 2020-01-15 DIAGNOSIS — S46902D Unspecified injury of unspecified muscle, fascia and tendon at shoulder and upper arm level, left arm, subsequent encounter: Secondary | ICD-10-CM | POA: Diagnosis not present

## 2020-01-15 DIAGNOSIS — M6281 Muscle weakness (generalized): Secondary | ICD-10-CM | POA: Diagnosis not present

## 2020-01-16 DIAGNOSIS — M6281 Muscle weakness (generalized): Secondary | ICD-10-CM | POA: Diagnosis not present

## 2020-01-16 DIAGNOSIS — S46902D Unspecified injury of unspecified muscle, fascia and tendon at shoulder and upper arm level, left arm, subsequent encounter: Secondary | ICD-10-CM | POA: Diagnosis not present

## 2020-01-23 DIAGNOSIS — S46902D Unspecified injury of unspecified muscle, fascia and tendon at shoulder and upper arm level, left arm, subsequent encounter: Secondary | ICD-10-CM | POA: Diagnosis not present

## 2020-01-23 DIAGNOSIS — M6281 Muscle weakness (generalized): Secondary | ICD-10-CM | POA: Diagnosis not present

## 2020-01-25 DIAGNOSIS — M6281 Muscle weakness (generalized): Secondary | ICD-10-CM | POA: Diagnosis not present

## 2020-01-25 DIAGNOSIS — S46902D Unspecified injury of unspecified muscle, fascia and tendon at shoulder and upper arm level, left arm, subsequent encounter: Secondary | ICD-10-CM | POA: Diagnosis not present

## 2020-01-28 DIAGNOSIS — M6281 Muscle weakness (generalized): Secondary | ICD-10-CM | POA: Diagnosis not present

## 2020-01-28 DIAGNOSIS — S46902D Unspecified injury of unspecified muscle, fascia and tendon at shoulder and upper arm level, left arm, subsequent encounter: Secondary | ICD-10-CM | POA: Diagnosis not present

## 2020-01-29 ENCOUNTER — Ambulatory Visit: Payer: Federal, State, Local not specified - PPO | Attending: Internal Medicine

## 2020-01-29 DIAGNOSIS — Z23 Encounter for immunization: Secondary | ICD-10-CM

## 2020-01-29 NOTE — Progress Notes (Signed)
   Covid-19 Vaccination Clinic  Name:  Richard Vaughn    MRN: [id number] DOB: [birth year]  01/29/2020  Richard Vaughn was observed post Covid-19 immunization for 15 minutes without incident. He was provided with Vaccine Information Sheet and instruction to access the V-Safe system.   Richard Vaughn was instructed to call 911 with any severe reactions post vaccine: Marland Kitchen Difficulty breathing  . Swelling of face and throat  . A fast heartbeat  . A bad rash all over body  . Dizziness and weakness   Immunizations Administered    Name Date Dose VIS Date Route   Pfizer COVID-19 Vaccine 01/29/2020 11:58 AM 0.3 mL 10/26/2019 Intramuscular   Manufacturer: [place]   Lot: [id number]   [place]: [id number]

## 2020-02-01 DIAGNOSIS — M6281 Muscle weakness (generalized): Secondary | ICD-10-CM | POA: Diagnosis not present

## 2020-02-01 DIAGNOSIS — S46902D Unspecified injury of unspecified muscle, fascia and tendon at shoulder and upper arm level, left arm, subsequent encounter: Secondary | ICD-10-CM | POA: Diagnosis not present

## 2020-02-04 DIAGNOSIS — M6281 Muscle weakness (generalized): Secondary | ICD-10-CM | POA: Diagnosis not present

## 2020-02-04 DIAGNOSIS — S46902D Unspecified injury of unspecified muscle, fascia and tendon at shoulder and upper arm level, left arm, subsequent encounter: Secondary | ICD-10-CM | POA: Diagnosis not present

## 2020-02-06 DIAGNOSIS — M6281 Muscle weakness (generalized): Secondary | ICD-10-CM | POA: Diagnosis not present

## 2020-02-06 DIAGNOSIS — S46902D Unspecified injury of unspecified muscle, fascia and tendon at shoulder and upper arm level, left arm, subsequent encounter: Secondary | ICD-10-CM | POA: Diagnosis not present

## 2020-02-08 DIAGNOSIS — S46902D Unspecified injury of unspecified muscle, fascia and tendon at shoulder and upper arm level, left arm, subsequent encounter: Secondary | ICD-10-CM | POA: Diagnosis not present

## 2020-02-08 DIAGNOSIS — M6281 Muscle weakness (generalized): Secondary | ICD-10-CM | POA: Diagnosis not present

## 2020-02-12 DIAGNOSIS — M67912 Unspecified disorder of synovium and tendon, left shoulder: Secondary | ICD-10-CM | POA: Diagnosis not present

## 2020-02-12 DIAGNOSIS — M25512 Pain in left shoulder: Secondary | ICD-10-CM | POA: Diagnosis not present

## 2020-02-13 ENCOUNTER — Other Ambulatory Visit: Payer: Self-pay | Admitting: Family Medicine

## 2020-02-13 DIAGNOSIS — M25512 Pain in left shoulder: Secondary | ICD-10-CM

## 2020-02-13 DIAGNOSIS — M67912 Unspecified disorder of synovium and tendon, left shoulder: Secondary | ICD-10-CM

## 2020-02-20 ENCOUNTER — Other Ambulatory Visit: Payer: Self-pay

## 2020-02-20 ENCOUNTER — Telehealth: Payer: Self-pay | Admitting: Gastroenterology

## 2020-02-20 DIAGNOSIS — R6881 Early satiety: Secondary | ICD-10-CM

## 2020-02-20 MED ORDER — ESOMEPRAZOLE MAGNESIUM 40 MG PO CPDR: 40.0000 mg | DELAYED_RELEASE_CAPSULE | Freq: Two times a day (BID) | ORAL | 0 refills | Status: DC

## 2020-02-20 NOTE — Telephone Encounter (Signed)
Script for generic, esomeprazole sent to pharmacy.

## 2020-02-20 NOTE — Progress Notes (Signed)
Generic nexium, esomeprazole, sent to pharmacy. Pt needs an appt for further refills. Last seen 01-2019

## 2020-02-22 ENCOUNTER — Encounter (HOSPITAL_COMMUNITY): Payer: Self-pay | Admitting: Emergency Medicine

## 2020-02-22 ENCOUNTER — Inpatient Hospital Stay (HOSPITAL_COMMUNITY)
Admission: EM | Admit: 2020-02-22 | Discharge: 2020-02-27 | DRG: 064 | Disposition: A | Discharge status: Rehabilitation Facility | Payer: Medicare Other | Attending: Family Medicine | Admitting: Family Medicine

## 2020-02-22 ENCOUNTER — Emergency Department (HOSPITAL_COMMUNITY): Payer: Medicare Other

## 2020-02-22 DIAGNOSIS — I619 Nontraumatic intracerebral hemorrhage, unspecified: Secondary | ICD-10-CM | POA: Diagnosis not present

## 2020-02-22 DIAGNOSIS — E041 Nontoxic single thyroid nodule: Secondary | ICD-10-CM | POA: Diagnosis present

## 2020-02-22 DIAGNOSIS — Z79891 Long term (current) use of opiate analgesic: Secondary | ICD-10-CM | POA: Diagnosis not present

## 2020-02-22 DIAGNOSIS — R29810 Facial weakness: Secondary | ICD-10-CM | POA: Diagnosis not present

## 2020-02-22 DIAGNOSIS — R52 Pain, unspecified: Secondary | ICD-10-CM | POA: Diagnosis not present

## 2020-02-22 DIAGNOSIS — G936 Cerebral edema: Secondary | ICD-10-CM | POA: Diagnosis present

## 2020-02-22 DIAGNOSIS — I610 Nontraumatic intracerebral hemorrhage in hemisphere, subcortical: Secondary | ICD-10-CM

## 2020-02-22 DIAGNOSIS — K2270 Barrett's esophagus without dysplasia: Secondary | ICD-10-CM | POA: Diagnosis present

## 2020-02-22 DIAGNOSIS — I69154 Hemiplegia and hemiparesis following nontraumatic intracerebral hemorrhage affecting left non-dominant side: Secondary | ICD-10-CM | POA: Diagnosis present

## 2020-02-22 DIAGNOSIS — I672 Cerebral atherosclerosis: Secondary | ICD-10-CM | POA: Diagnosis present

## 2020-02-22 DIAGNOSIS — I10 Essential (primary) hypertension: Secondary | ICD-10-CM | POA: Diagnosis not present

## 2020-02-22 DIAGNOSIS — M549 Dorsalgia, unspecified: Secondary | ICD-10-CM | POA: Diagnosis present

## 2020-02-22 DIAGNOSIS — Z800 Family history of malignant neoplasm of digestive organs: Secondary | ICD-10-CM | POA: Diagnosis not present

## 2020-02-22 DIAGNOSIS — Z8673 Personal history of transient ischemic attack (TIA), and cerebral infarction without residual deficits: Secondary | ICD-10-CM

## 2020-02-22 DIAGNOSIS — Z87442 Personal history of urinary calculi: Secondary | ICD-10-CM

## 2020-02-22 DIAGNOSIS — G894 Chronic pain syndrome: Secondary | ICD-10-CM | POA: Diagnosis present

## 2020-02-22 DIAGNOSIS — G4733 Obstructive sleep apnea (adult) (pediatric): Secondary | ICD-10-CM | POA: Diagnosis present

## 2020-02-22 DIAGNOSIS — Z823 Family history of stroke: Secondary | ICD-10-CM

## 2020-02-22 DIAGNOSIS — G8929 Other chronic pain: Secondary | ICD-10-CM | POA: Diagnosis not present

## 2020-02-22 DIAGNOSIS — G479 Sleep disorder, unspecified: Secondary | ICD-10-CM | POA: Diagnosis not present

## 2020-02-22 DIAGNOSIS — I616 Nontraumatic intracerebral hemorrhage, multiple localized: Secondary | ICD-10-CM | POA: Diagnosis not present

## 2020-02-22 DIAGNOSIS — F329 Major depressive disorder, single episode, unspecified: Secondary | ICD-10-CM | POA: Diagnosis present

## 2020-02-22 DIAGNOSIS — R001 Bradycardia, unspecified: Secondary | ICD-10-CM | POA: Diagnosis present

## 2020-02-22 DIAGNOSIS — R270 Ataxia, unspecified: Secondary | ICD-10-CM | POA: Diagnosis present

## 2020-02-22 DIAGNOSIS — E669 Obesity, unspecified: Secondary | ICD-10-CM | POA: Diagnosis present

## 2020-02-22 DIAGNOSIS — G8194 Hemiplegia, unspecified affecting left nondominant side: Secondary | ICD-10-CM | POA: Diagnosis present

## 2020-02-22 DIAGNOSIS — R4781 Slurred speech: Secondary | ICD-10-CM | POA: Diagnosis not present

## 2020-02-22 DIAGNOSIS — I618 Other nontraumatic intracerebral hemorrhage: Secondary | ICD-10-CM | POA: Diagnosis present

## 2020-02-22 DIAGNOSIS — Z6833 Body mass index (BMI) 33.0-33.9, adult: Secondary | ICD-10-CM

## 2020-02-22 DIAGNOSIS — K219 Gastro-esophageal reflux disease without esophagitis: Secondary | ICD-10-CM | POA: Diagnosis present

## 2020-02-22 DIAGNOSIS — F17210 Nicotine dependence, cigarettes, uncomplicated: Secondary | ICD-10-CM | POA: Diagnosis present

## 2020-02-22 DIAGNOSIS — R509 Fever, unspecified: Secondary | ICD-10-CM

## 2020-02-22 DIAGNOSIS — Z20822 Contact with and (suspected) exposure to covid-19: Secondary | ICD-10-CM | POA: Diagnosis present

## 2020-02-22 DIAGNOSIS — I739 Peripheral vascular disease, unspecified: Secondary | ICD-10-CM | POA: Diagnosis present

## 2020-02-22 DIAGNOSIS — Z8249 Family history of ischemic heart disease and other diseases of the circulatory system: Secondary | ICD-10-CM

## 2020-02-22 DIAGNOSIS — Z79899 Other long term (current) drug therapy: Secondary | ICD-10-CM

## 2020-02-22 DIAGNOSIS — R339 Retention of urine, unspecified: Secondary | ICD-10-CM | POA: Diagnosis present

## 2020-02-22 DIAGNOSIS — F5104 Psychophysiologic insomnia: Secondary | ICD-10-CM | POA: Diagnosis present

## 2020-02-22 DIAGNOSIS — E785 Hyperlipidemia, unspecified: Secondary | ICD-10-CM | POA: Diagnosis present

## 2020-02-22 DIAGNOSIS — I080 Rheumatic disorders of both mitral and aortic valves: Secondary | ICD-10-CM | POA: Diagnosis present

## 2020-02-22 DIAGNOSIS — R471 Dysarthria and anarthria: Secondary | ICD-10-CM | POA: Diagnosis present

## 2020-02-22 DIAGNOSIS — R29818 Other symptoms and signs involving the nervous system: Secondary | ICD-10-CM | POA: Diagnosis not present

## 2020-02-22 DIAGNOSIS — I6389 Other cerebral infarction: Secondary | ICD-10-CM | POA: Diagnosis not present

## 2020-02-22 DIAGNOSIS — J129 Viral pneumonia, unspecified: Secondary | ICD-10-CM | POA: Diagnosis present

## 2020-02-22 DIAGNOSIS — I69193 Ataxia following nontraumatic intracerebral hemorrhage: Secondary | ICD-10-CM | POA: Diagnosis not present

## 2020-02-22 DIAGNOSIS — I959 Hypotension, unspecified: Secondary | ICD-10-CM | POA: Diagnosis not present

## 2020-02-22 DIAGNOSIS — R29704 NIHSS score 4: Secondary | ICD-10-CM | POA: Diagnosis present

## 2020-02-22 DIAGNOSIS — Z791 Long term (current) use of non-steroidal anti-inflammatories (NSAID): Secondary | ICD-10-CM

## 2020-02-22 DIAGNOSIS — I69122 Dysarthria following nontraumatic intracerebral hemorrhage: Secondary | ICD-10-CM | POA: Diagnosis not present

## 2020-02-22 DIAGNOSIS — I639 Cerebral infarction, unspecified: Secondary | ICD-10-CM | POA: Diagnosis not present

## 2020-02-22 DIAGNOSIS — R0902 Hypoxemia: Secondary | ICD-10-CM | POA: Diagnosis not present

## 2020-02-22 DIAGNOSIS — Z7982 Long term (current) use of aspirin: Secondary | ICD-10-CM | POA: Diagnosis not present

## 2020-02-22 DIAGNOSIS — R000 Tachycardia, unspecified: Secondary | ICD-10-CM | POA: Diagnosis not present

## 2020-02-22 LAB — RESPIRATORY PANEL BY RT PCR (FLU A&B, COVID)
Influenza A by PCR: NEGATIVE
Influenza B by PCR: NEGATIVE
SARS Coronavirus 2 by RT PCR: NEGATIVE

## 2020-02-22 LAB — DIFFERENTIAL
Abs Immature Granulocytes: 0.01 10*3/uL (ref 0.00–0.07)
Basophils Absolute: 0 10*3/uL (ref 0.0–0.1)
Basophils Relative: 1 %
Eosinophils Absolute: 0.1 10*3/uL (ref 0.0–0.5)
Eosinophils Relative: 2 %
Immature Granulocytes: 0 %
Lymphocytes Relative: 33 %
Lymphs Abs: 2.2 10*3/uL (ref 0.7–4.0)
Monocytes Absolute: 0.7 10*3/uL (ref 0.1–1.0)
Monocytes Relative: 10 %
Neutro Abs: 3.7 10*3/uL (ref 1.7–7.7)
Neutrophils Relative %: 54 %

## 2020-02-22 LAB — COMPREHENSIVE METABOLIC PANEL
ALT: 22 U/L (ref 0–44)
AST: 20 U/L (ref 15–41)
Albumin: 3.9 g/dL (ref 3.5–5.0)
Alkaline Phosphatase: 73 U/L (ref 38–126)
Anion gap: 8 (ref 5–15)
BUN: 14 mg/dL (ref 8–23)
CO2: 22 mmol/L (ref 22–32)
Calcium: 9 mg/dL (ref 8.9–10.3)
Chloride: 109 mmol/L (ref 98–111)
Creatinine, Ser: 1.05 mg/dL (ref 0.61–1.24)
GFR calc Af Amer: >60 mL/min (ref >60)
GFR calc non Af Amer: >60 mL/min (ref >60)
Glucose, Bld: 98 mg/dL (ref 70–99)
Potassium: 4.1 mmol/L (ref 3.5–5.1)
Sodium: 139 mmol/L (ref 135–145)
Total Bilirubin: 0.5 mg/dL (ref 0.3–1.2)
Total Protein: 6.6 g/dL (ref 6.5–8.1)

## 2020-02-22 LAB — CBC
HCT: 42.3 % (ref 39.0–52.0)
Hemoglobin: 14 g/dL (ref 13.0–17.0)
MCH: 31.5 pg (ref 26.0–34.0)
MCHC: 33.1 g/dL (ref 30.0–36.0)
MCV: 95.1 fL (ref 80.0–100.0)
Platelets: 148 10*3/uL — ABNORMAL LOW (ref 150–400)
RBC: 4.45 MIL/uL (ref 4.22–5.81)
RDW: 12.3 % (ref 11.5–15.5)
WBC: 6.6 10*3/uL (ref 4.0–10.5)
nRBC: 0 % (ref 0.0–0.2)

## 2020-02-22 LAB — PROTIME-INR
INR: 1.2 (ref 0.8–1.2)
Prothrombin Time: 14.7 seconds (ref 11.4–15.2)

## 2020-02-22 LAB — I-STAT CHEM 8, ED
BUN: 15 mg/dL (ref 8–23)
Calcium, Ion: 1.13 mmol/L — ABNORMAL LOW (ref 1.15–1.40)
Chloride: 106 mmol/L (ref 98–111)
Creatinine, Ser: 1 mg/dL (ref 0.61–1.24)
Glucose, Bld: 95 mg/dL (ref 70–99)
HCT: 40 % (ref 39.0–52.0)
Hemoglobin: 13.6 g/dL (ref 13.0–17.0)
Potassium: 3.8 mmol/L (ref 3.5–5.1)
Sodium: 140 mmol/L (ref 135–145)
TCO2: 25 mmol/L (ref 22–32)

## 2020-02-22 LAB — LIPID PANEL
Cholesterol: 125 mg/dL (ref 0–200)
HDL: 35 mg/dL — ABNORMAL LOW (ref >40)
LDL Cholesterol: 73 mg/dL (ref 0–99)
Total CHOL/HDL Ratio: 3.6 RATIO
Triglycerides: 86 mg/dL (ref <150)
VLDL: 17 mg/dL (ref 0–40)

## 2020-02-22 LAB — APTT: aPTT: 31 seconds (ref 24–36)

## 2020-02-22 LAB — HIV ANTIBODY (ROUTINE TESTING W REFLEX): HIV Screen 4th Generation wRfx: NONREACTIVE

## 2020-02-22 MED ORDER — IOHEXOL 350 MG/ML SOLN
75.0000 mL | Freq: Once | INTRAVENOUS | Status: AC | PRN
  Administered 2020-02-22: 75 mL via INTRAVENOUS

## 2020-02-22 MED ORDER — ACETAMINOPHEN 325 MG PO TABS
650.0000 mg | ORAL_TABLET | ORAL | Status: DC | PRN
  Administered 2020-02-23 – 2020-02-25 (×6): 650 mg via ORAL
  Filled 2020-02-22 (×6): qty 2

## 2020-02-22 MED ORDER — CHLORHEXIDINE GLUCONATE CLOTH 2 % EX PADS
6.0000 | MEDICATED_PAD | Freq: Every day | CUTANEOUS | Status: DC
  Administered 2020-02-23 – 2020-02-27 (×4): 6 via TOPICAL

## 2020-02-22 MED ORDER — CLEVIDIPINE BUTYRATE 0.5 MG/ML IV EMUL: 0.0000 mg/h | INTRAVENOUS | Status: DC

## 2020-02-22 MED ORDER — PANTOPRAZOLE SODIUM 40 MG IV SOLR
40.0000 mg | Freq: Every day | INTRAVENOUS | Status: DC
  Administered 2020-02-23: 40 mg via INTRAVENOUS
  Filled 2020-02-22: qty 40

## 2020-02-22 MED ORDER — ACETAMINOPHEN 650 MG RE SUPP
650.0000 mg | RECTAL | Status: DC | PRN
  Administered 2020-02-23: 650 mg via RECTAL
  Filled 2020-02-22: qty 1

## 2020-02-22 MED ORDER — LABETALOL HCL 5 MG/ML IV SOLN: 20.0000 mg | Freq: Once | INTRAVENOUS | Status: DC

## 2020-02-22 MED ORDER — SENNOSIDES-DOCUSATE SODIUM 8.6-50 MG PO TABS
1.0000 | ORAL_TABLET | Freq: Two times a day (BID) | ORAL | Status: DC
  Administered 2020-02-23 – 2020-02-27 (×8): 1 via ORAL
  Filled 2020-02-22 (×8): qty 1

## 2020-02-22 MED ORDER — SODIUM CHLORIDE 0.9% FLUSH
3.0000 mL | Freq: Once | INTRAVENOUS | Status: DC
Start: 2020-02-22 — End: 2020-02-26

## 2020-02-22 MED ORDER — ACETAMINOPHEN 160 MG/5ML PO SOLN: 650.0000 mg | ORAL | Status: DC | PRN

## 2020-02-22 MED ORDER — STROKE: EARLY STAGES OF RECOVERY BOOK: Freq: Once | Status: AC

## 2020-02-22 NOTE — ED Triage Notes (Signed)
Per EMS, pt from hotel w/ wife.  At [place] he began to notice slurred speech and left sided weakness which has gotten better while in transport.  Only takes ASA.  Hx of bleeds and strokes on both sides of brain.  Bilateral 18G AC IVs inserted.

## 2020-02-22 NOTE — ED Notes (Signed)
Wife arrived, RN updated to pt's current condition.  Neuro provider will meet pt in the ICU waiting room for further discussion.

## 2020-02-22 NOTE — ED Provider Notes (Signed)
[facility] EMERGENCY DEPARTMENT Provider Note   CSN: [id number] Arrival date & time: 02/22/20  2058  An emergency department physician performed an initial assessment on this suspected stroke patient at 2100.  History Chief Complaint  Patient presents with  . Code Stroke    Richard Vaughn is a 69 y.o. male with PMHx HTN, GERD, previous stroke who presents to the ED via EMS with complaint of slurred speech and left sided weakness that began at approximately 7:45 PM. Pt with hx of of both ischemic stroke and ICHs; currently only on baby aspirin. LKN just prior to 7:45 PM tonight.   The history is provided by the patient and the EMS personnel.       Past Medical History:  Diagnosis Date  . Barrett's esophagus   . Chronic back pain   . Gastritis    Mild  . Gastroparesis   . GERD (gastroesophageal reflux disease)   . Headache   . Hyperglycemia   . Hypertension   . Nephrolithiasis   . Renal cyst   . Stricture and stenosis of esophagus   . Stroke ([place]) 07-2015  . Vision abnormalities     Patient Active Problem List   Diagnosis Date Noted  . Treatment-emergent central sleep apnea 10/25/2018  . 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 Chronic pain syndrome   . Chronic bilateral low back pain   . Benign essential HTN   . Tobacco abuse   . Marijuana abuse   . Hyperlipidemia   . History of CVA with residual deficit   . Dysphagia, post-stroke   . ICH (intracerebral hemorrhage) (HCC) - R thalmic/PLIC d/t HTN [id number]  . Insomnia 07/13/2017  . PAD (peripheral artery disease) ([place]) 07/13/2017  . Hyperlipidemia with target LDL less than 70 07/13/2017  . Stroke-like symptom 09/22/2016  . Paresthesia 09/22/2016  . CVA (cerebral vascular accident) ([place]) 09/22/2016  . Cerebrovascular accident (CVA) due to thrombosis of left carotid artery ([place]) 09/23/2015  . Primary snoring 09/23/2015  . Hypersomnia with sleep apnea  09/23/2015  . Obstructive sleep apnea 09/23/2015  . Lacunar infarct, acute ([place]) 08/25/2015  . Sleep apnea 08/25/2015  . Dysarthria   . CVD (cardiovascular disease) 08/02/2015  . Acute hyperglycemia 08/02/2015  . Systolic hypertension with cerebrovascular disease 12/27/2014  . Epistaxis 07/28/2012  . HYPERGLYCEMIA 10/02/2010  . NEPHROLITHIASIS 05/23/2009  . BARRETTS ESOPHAGUS 02/20/2009  . Gastroparesis 02/20/2009  . RADICULOPATHY 10/21/2008  . GERD 10/08/2008  . ESOPHAGITIS 08/15/2008  . ESOPHAGEAL STRICTURE 08/15/2008  . GASTRITIS, ACUTE 08/15/2008  . RENAL CYST 08/14/2008  . TOBACCO ABUSE 08/08/2008  . HEMATURIA, MICROSCOPIC, HX OF 08/08/2008  . PULMONARY NODULE 01/10/2008    Past Surgical History:  Procedure Laterality Date  . [place] SURGERY  2012  . COLONOSCOPY    . [place] SURGERY  2005, 2010   replaced L4 and L5  . UPPER GASTROINTESTINAL ENDOSCOPY         Family History  Problem Relation Age of Onset  . Hypertension Father   . Stroke Father   . Hypertension Mother   . Liver cancer Mother   . Hypertension Sister        2 other sisters has also  . Stroke Sister   . Stroke Sister   . Hypertension Brother        2nd brother has also  . Colon cancer Neg Hx   . Esophageal cancer Neg Hx   . Rectal cancer  Neg Hx   . Stomach cancer Neg Hx   . Colon polyps Neg Hx     Social History   Tobacco Use  . Smoking status: Former Smoker    Packs/day: 0.50    Years: 30.00    Pack years: 15.00    Types: Cigarettes    Quit date: 04/15/2018    Years since quitting: 1.8  . Smokeless tobacco: Never Used  Substance Use Topics  . Alcohol use: No    Alcohol/week: 0.0 standard drinks  . Drug use: Yes    Types: Oxycodone, Morphine    Comment: last use 04/2018    Home Medications Prior to Admission medications   Medication Sig Start Date End Date Taking? Authorizing Provider  amLODipine (NORVASC) 2.5 MG tablet Take 1 tablet (2.5 mg total) by mouth daily.  07/14/19  Yes Martinique, [name], MD  aspirin 81 MG tablet Take 1 tablet (81 mg total) by mouth daily. 06/05/18  Yes McCue, Janett Billow, NP  esomeprazole (NEXIUM) 40 MG capsule Take 1 capsule (40 mg total) by mouth 2 (two) times daily before a meal. Please schedule a yearly follow up for any further refills.  Last seen 01-2019. Patient taking differently: Take 40 mg by mouth 2 (two) times daily before a meal.  02/20/20  Yes Armbruster, Carlota Raspberry, MD  naproxen (NAPROSYN) 500 MG tablet Take 500 mg by mouth 2 (two) times daily. 01/14/20  Yes [provider]  olmesartan (BENICAR) 20 MG tablet Take 1 tablet (20 mg total) by mouth daily. 07/14/19  Yes Martinique, [name], MD  oxymorphone (OPANA) 10 MG tablet Take 10 mg by mouth in the morning and at bedtime.    Yes [provider]  traZODone (DESYREL) 50 MG tablet TAKE 1 TABLET AT BEDTIME   NEEDS FOLLOW-UP APPOINTMENTBEFORE MORE REFILLS ARE    AUTHORIZED Patient taking differently: Take 50 mg by mouth at bedtime.  07/09/19  Yes Martinique, [name], MD    Allergies    Patient has no known allergies.  Review of Systems   Review of Systems  Neurological: Positive for speech difficulty and weakness.  All other systems reviewed and are negative.   Physical Exam Updated Vital Signs BP 127/84 (BP Location: Right Arm)   Pulse 68   Temp 98.6 F (37 C) (Oral)   Resp 16   Ht 6\' 2"  (1.88 m)   Wt 115.5 kg   SpO2 94%   BMI 32.69 kg/m   Physical Exam Vitals and nursing note reviewed.  Constitutional:      Appearance: He is not ill-appearing.  HENT:     Head: Normocephalic and atraumatic.  Eyes:     Conjunctiva/sclera: Conjunctivae normal.     Pupils: Pupils are equal, round, and reactive to light.     Comments: Rightward gaze  Cardiovascular:     Rate and Rhythm: Normal rate and regular rhythm.     Pulses: Normal pulses.  Pulmonary:     Effort: Pulmonary effort is normal.     Breath sounds: Normal breath sounds. No wheezing, rhonchi or rales.    Skin:    General: Skin is warm and dry.     Coloration: Skin is not jaundiced.  Neurological:     Mental Status: He is alert and oriented to person, place, and time.     Comments: Right eye gaze. Left sided facial droop. Mild dysarthric speech identified however pt alert and oriented x 3  No pronator drift. Strength decreased on left side  compared to right (4/5 on left).      ED Results / Procedures / Treatments   Labs (all labs ordered are listed, but only abnormal results are displayed) Labs Reviewed  CBC - Abnormal; Notable for the following components:      Result Value   Platelets 148 (*)    All other components within normal limits  LIPID PANEL - Abnormal; Notable for the following components:   HDL 35 (*)    All other components within normal limits  I-STAT CHEM 8, ED - Abnormal; Notable for the following components:   Calcium, Ion 1.13 (*)    All other components within normal limits  RESPIRATORY PANEL BY RT PCR (FLU A&B, COVID)  MRSA PCR SCREENING  PROTIME-INR  APTT  DIFFERENTIAL  COMPREHENSIVE METABOLIC PANEL  HIV ANTIBODY (ROUTINE TESTING W REFLEX)  HEMOGLOBIN A1C  CBG MONITORING, ED    EKG EKG Interpretation  Date/Time:  Friday February 22 2020 21:35:05 EDT Ventricular Rate:  64 PR Interval:    QRS Duration: 103 QT Interval:  396 QTC Calculation: 409 R Axis:   11 Text Interpretation: Sinus rhythm Low voltage, precordial leads normal, no sig change from previous Confirmed by Charlesetta Shanks [phone]) on 02/22/2020 9:38:24 PM   Radiology CT Code Stroke CTA Head W/WO contrast  Result Date: 02/22/2020 CLINICAL DATA:  69 year old male code stroke presentation including rightward gaze. Small acute hemorrhage in the right thalamus. EXAM: CT ANGIOGRAPHY HEAD AND NECK TECHNIQUE: Multidetector CT imaging of the head and neck was performed using the standard protocol during bolus administration of intravenous contrast. Multiplanar CT image reconstructions and MIPs were  obtained to evaluate the vascular anatomy. Carotid stenosis measurements (when applicable) are obtained utilizing NASCET criteria, using the distal internal carotid diameter as the denominator. CONTRAST:  12mL OMNIPAQUE IOHEXOL 350 MG/ML SOLN COMPARISON:  Plain head CT earlier tonight. CTA head and neck 09/23/2016. FINDINGS: CTA NECK Skeleton: Occasional dental caries. No acute osseous abnormality identified. Upper chest: Centrilobular emphysema again noted. No superior mediastinal lymphadenopathy. Other neck: Mild chronic thyromegaly. There is a 16 mm nodule in the left thyroid lobe which meets consensus criteria for ultrasound follow-up (ref: J Am Coll Radiol. 2015 Feb;12(2): 143-50). Additionally, there is 14 mm round mildly hyperenhancing soft tissue nodule in the left parapharyngeal space inseparable from the deep lobe of the left parotid gland. This was about 12 mm in 2017 and has been visible on brain MRI since 08/02/2015 (12-13 cm at that time). No superimposed cervical lymphadenopathy and otherwise negative neck soft tissues. Aortic arch: Mild Calcified aortic atherosclerosis. 3 vessel arch configuration. Right carotid system: Negative. Left carotid system: Negative. Vertebral arteries: Mild soft and calcified plaque near the right vertebral artery origin but no origin stenosis. The right vertebral appears dominant and is patent to the skull base without stenosis. Minimal plaque in the proximal left subclavian artery without stenosis. Non dominant left vertebral artery origin appears stenotic due to plaque on series 8, image 202, at least moderate stenosis. The left vertebral is non dominant but patent to the skull base without additional stenosis. CTA HEAD Posterior circulation: The non dominant left vertebral V4 segment is diminutive but patent to the basilar. Patent left PICA origin. Dominant right vertebral artery supplies the basilar without stenosis. Normal right PICA origin. Patent basilar artery  without stenosis. Patent SCA and PCA origins. Bilateral PCA branches are stable. There is mild right P1/P2 irregularity but no significant stenosis. Anterior circulation: Both ICA siphons are patent. There is mild  bilateral calcified plaque with no stenosis. Normal ophthalmic artery origins. Patent carotid termini. Patent MCA and ACA origins. Tortuous A1 segments. Normal anterior communicating artery. Bilateral ACA branches are within normal limits. Left MCA M1 segment and bifurcation are patent without stenosis. Right MCA M1 segment and bifurcation are patent without stenosis. Bilateral MCA branches appear stable since 2017, no significant stenosis. Right thalamic blood redemonstrated. No CTA spot sign identified. Venous sinuses: Patent. Anatomic variants: Dominant right vertebral artery. Review of the MIP images confirms the above findings IMPRESSION: 1. Stable arterial findings since a 2017 CTA: No large vessel occlusion. Minimal atherosclerosis in the head and neck - although there is at least moderate stenosis suspected at the origin of the non-dominant left vertebral artery origin. 2. No CTA spot sign associated with the right thalamic hemorrhage. 3. 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 This appears not significantly changed since a 2016 brain MRI. - a 14 mm left thyroid lobe nodule meets consensus criteria for dedicated Thyroid Ultrasound follow-up. Electronically Signed   By: Genevie Ann M.D.   On: 02/22/2020 21:46   CT Code Stroke CTA Neck W/WO contrast  Result Date: 02/22/2020 CLINICAL DATA:  69 year old male code stroke presentation including rightward gaze. Small acute hemorrhage in the right thalamus. EXAM: CT ANGIOGRAPHY HEAD AND NECK TECHNIQUE: Multidetector CT imaging of the head and neck was performed using the standard  protocol during bolus administration of intravenous contrast. Multiplanar CT image reconstructions and MIPs were obtained to evaluate the vascular anatomy. Carotid stenosis measurements (when applicable) are obtained utilizing NASCET criteria, using the distal internal carotid diameter as the denominator. CONTRAST:  69mL OMNIPAQUE IOHEXOL 350 MG/ML SOLN COMPARISON:  Plain head CT earlier tonight. CTA head and neck 09/23/2016. FINDINGS: CTA NECK Skeleton: Occasional dental caries. No acute osseous abnormality identified. Upper chest: Centrilobular emphysema again noted. No superior mediastinal lymphadenopathy. Other neck: Mild chronic thyromegaly. There is a 16 mm nodule in the left thyroid lobe which meets consensus criteria for ultrasound follow-up (ref: J Am Coll Radiol. 2015 Feb;12(2): 143-50). Additionally, there is 14 mm round mildly hyperenhancing soft tissue nodule in the left parapharyngeal space inseparable from the deep lobe of the left parotid gland. This was about 12 mm in 2017 and has been visible on brain MRI since 08/02/2015 (12-13 cm at that time). No superimposed cervical lymphadenopathy and otherwise negative neck soft tissues. Aortic arch: Mild Calcified aortic atherosclerosis. 3 vessel arch configuration. Right carotid system: Negative. Left carotid system: Negative. Vertebral arteries: Mild soft and calcified plaque near the right vertebral artery origin but no origin stenosis. The right vertebral appears dominant and is patent to the skull base without stenosis. Minimal plaque in the proximal left subclavian artery without stenosis. Non dominant left vertebral artery origin appears stenotic due to plaque on series 8, image 202, at least moderate stenosis. The left vertebral is non dominant but patent to the skull base without additional stenosis. CTA HEAD Posterior circulation: The non dominant left vertebral V4 segment is diminutive but patent to the basilar. Patent left PICA origin. Dominant  right vertebral artery supplies the basilar without stenosis. Normal right PICA origin. Patent basilar artery without stenosis. Patent SCA and PCA origins. Bilateral PCA branches are stable. There is mild right P1/P2 irregularity but no significant stenosis. Anterior circulation: Both ICA  siphons are patent. There is mild bilateral calcified plaque with no stenosis. Normal ophthalmic artery origins. Patent carotid termini. Patent MCA and ACA origins. Tortuous A1 segments. Normal anterior communicating artery. Bilateral ACA branches are within normal limits. Left MCA M1 segment and bifurcation are patent without stenosis. Right MCA M1 segment and bifurcation are patent without stenosis. Bilateral MCA branches appear stable since 2017, no significant stenosis. Right thalamic blood redemonstrated. No CTA spot sign identified. Venous sinuses: Patent. Anatomic variants: Dominant right vertebral artery. Review of the MIP images confirms the above findings IMPRESSION: 1. Stable arterial findings since a 2017 CTA: No large vessel occlusion. Minimal atherosclerosis in the head and neck - although there is at least moderate stenosis suspected at the origin of the non-dominant left vertebral artery origin. 2. No CTA spot sign associated with the right thalamic hemorrhage. 3. 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 This appears not significantly changed since a 2016 brain MRI. - a 14 mm left thyroid lobe nodule meets consensus criteria for dedicated Thyroid Ultrasound follow-up. Electronically Signed   By: Genevie Ann M.D.   On: 02/22/2020 21:46   CT HEAD CODE STROKE WO CONTRAST  Result Date: 02/22/2020 CLINICAL DATA:  Code stroke. 69 year old male with left side weakness and slurred speech. EXAM: CT HEAD WITHOUT CONTRAST TECHNIQUE: Contiguous axial  images were obtained from the base of the skull through the vertex without intravenous contrast. COMPARISON:  Head CT 06/01/2018. Brain MRI 09/22/2016. FINDINGS: Brain: Lobulated hyperdense hemorrhage centered in the right thalamus encompasses 23 x 21 by 24 mm (AP by transverse by CC). Estimated blood volume 6 mL mild surrounding edema. Mild regional mass effect. Blood products abut the right lateral ventricle but no intraventricular extension is identified. No significant intracranial mass effect. No ventriculomegaly. Superimposed chronically advanced bilateral white matter disease and multifocal chronic lacunar infarcts in the left corona radiata and basal ganglia. No superimposed acute cortically based infarct identified. Vascular: Mild Calcified atherosclerosis at the skull base. No suspicious intracranial vascular hyperdensity. Skull: No acute osseous abnormality identified. Sinuses/Orbits: Chronic left mastoid effusion. Left tympanic cavity and paranasal sinuses remain clear. Other: Rightward gaze. Visualized scalp soft tissues are within normal limits. ASPECTS [facility] Stroke Program Early CT Score) Total score (0-10 with 10 being normal): (Acute hemorrhage). IMPRESSION: 1. Positive for acute right thalamic hemorrhage (6 mL) with mild surrounding edema and minimal mass effect. No intraventricular or extra-axial extension at this time. 2. Underlying advanced chronic small vessel disease. 3. Study discussed by telephone with Dr. Rory Percy on 02/22/2020 at 21:14 . Electronically Signed   By: Genevie Ann M.D.   On: 02/22/2020 21:17    Procedures Procedures (including critical care time)  Medications Ordered in ED Medications  sodium chloride flush (NS) 0.9 % injection 3 mL (has no administration in time range)   stroke: mapping our early stages of recovery book (has no administration in time range)  acetaminophen (TYLENOL) tablet 650 mg (has no administration in time range)    Or  acetaminophen (TYLENOL) 160  MG/5ML solution 650 mg (has no administration in time range)    Or  acetaminophen (TYLENOL) suppository 650 mg (has no administration in time range)  senna-docusate (Senokot-S) tablet 1 tablet (has no administration in time range)  pantoprazole (PROTONIX) injection 40 mg (has no administration in  time range)  labetalol (NORMODYNE) injection 20 mg (has no administration in time range)    And  clevidipine (CLEVIPREX) infusion 0.5 mg/mL (has no administration in time range)  Chlorhexidine Gluconate Cloth 2 % PADS 6 each (has no administration in time range)  iohexol (OMNIPAQUE) 350 MG/ML injection 75 mL (75 mLs Intravenous Contrast Given 02/22/20 2116)    ED Course  I have reviewed the triage vital signs and the nursing notes.  Pertinent labs & imaging results that were available during my care of the patient were reviewed by me and considered in my medical decision making (see chart for details).    MDM Rules/Calculators/A&P                      69 year old male presenting to the ED with slurred speech and left sided weakness that began at 7:45 PM tonight; LKN just prior with wife witnessing symptoms. EMS reports hx of stroke in the past; currently on 81 mg ASA. CODE STROKE initiated when pt entered the ED; taken immediately to CT scanner where an ICH was identified. Neurologist Dr. Rory Percy at bedside; pt does have rightward gaze as well and therefore CTAs ordered. Dr. Rory Percy has decided to admit patient to neuro ICU and has placed orders. 2 hour COVID test obtained. Blood pressure stable currently. Does not require any BP intervention.   This note was prepared using Dragon voice recognition software and may include unintentional dictation errors due to the inherent limitations of voice recognition software.   Final Clinical Impression(s) / ED Diagnoses Final diagnoses:  Right-sided nontraumatic intracerebral hemorrhage, unspecified cerebral location [facility])    Rx / DC Orders ED Discharge  Orders    None       Eustaquio Maize, PA-C 02/22/20 2236    Charlesetta Shanks, MD 02/26/20 1729

## 2020-02-22 NOTE — ED Provider Notes (Deleted)
Medical screening examination/treatment/procedure(s) were conducted as a shared visit with non-physician practitioner(s) and myself.  I personally evaluated the patient during the encounter.       Charlesetta Shanks, MD 02/26/20 1729

## 2020-02-22 NOTE — H&P (Signed)
Stroke Neurology History and Physical Nontraumatic ICH admission  CC: Code stroke-left-sided weakness and slurred speech  History is obtained from: Patient and chart review  HPI: Richard Vaughn is a 69 y.o. male past medical history of hypertension, hyperlipidemia, Barrett's esophagus, multiple lacunar infarcts, prior small hypertensive bleeds in bilateral deep gray matter structures with most recent [place] in the right thalamus/posterior limb of internal capsule in 2019 with mild residual left-sided weakness presenting to the emergency room for sudden onset of worsening dysarthria and left-sided weakness.  He says that he was with his wife, having normal conversation and suddenly around 7:45 PM today started noticing extremely slurred speech.  His speech had recovered after the last stroke and was not slurred-last neurology notes from 09/03/2018 do mention mild dysarthria but no motor deficits. He also noted worsening weakness on the left arm and leg making it difficult to control the arm and leg and walk. He was evaluated by EMS, brought in as an acute code stroke, seen by me at the ER bridge emergently, stat head CT done that showed a 6 cc hemorrhage in the right thalamus with mild surrounding edema and minimal mass-effect.  There is no intraventricular extension or extra-axial extension at this time but it looks to be pressing on the lateral ventricle. He reports some mild pressure-like headache on the right side of his head. Also reports difficulty with his vision and facial drooping.  LKW: 7:45 PM today tpa given?: no, ICH Premorbid modified Rankin scale (mRS):1  ROS: ROS performed and negative except as noted in HPI  Past Medical History:  Diagnosis Date  . Barrett's esophagus   . Chronic back pain   . Gastritis    Mild  . Gastroparesis   . GERD (gastroesophageal reflux disease)   . Headache   . Hyperglycemia   . Hypertension   . Nephrolithiasis   . Renal cyst   . Stricture and  stenosis of esophagus   . Stroke ([place]) 07-2015  . Vision abnormalities    Family History  Problem Relation Age of Onset  . Hypertension Father   . Stroke Father   . Hypertension Mother   . Liver cancer Mother   . Hypertension Sister        2 other sisters has also  . Stroke Sister   . Stroke Sister   . Hypertension Brother        2nd brother has also  . Colon cancer Neg Hx   . Esophageal cancer Neg Hx   . Rectal cancer Neg Hx   . Stomach cancer Neg Hx   . Colon polyps Neg Hx    Social History:   reports that he quit smoking about 22 months ago. His smoking use included cigarettes. He has a 15.00 pack-year smoking history. He has never used smokeless tobacco. He reports current drug use. Drugs: Oxycodone and Morphine. He reports that he does not drink alcohol.  Medications  Current Facility-Administered Medications:  .  0.9 %  sodium chloride infusion, 500 mL, Intravenous, Continuous, Armbruster, Carlota Raspberry, MD .  sodium chloride flush (NS) 0.9 % injection 3 mL, 3 mL, Intravenous, Once, Pfeiffer, Marcy, MD  Current Outpatient Medications:  .  amLODipine (NORVASC) 2.5 MG tablet, Take 1 tablet (2.5 mg total) by mouth daily., Disp: 90 tablet, Rfl: 2 .  aspirin 81 MG tablet, Take 1 tablet (81 mg total) by mouth daily., Disp: 30 tablet, Rfl:  .  esomeprazole (NEXIUM) 40 MG capsule, Take 1 capsule (40  mg total) by mouth 2 (two) times daily before a meal. Please schedule a yearly follow up for any further refills.  Last seen 01-2019., Disp: 60 capsule, Rfl: 0 .  olmesartan (BENICAR) 20 MG tablet, Take 1 tablet (20 mg total) by mouth daily., Disp: 90 tablet, Rfl: 2 .  oxymorphone (OPANA) 10 MG tablet, Take 10 mg by mouth every 6 (six) hours as needed for pain. , Disp: , Rfl:  .  traZODone (DESYREL) 50 MG tablet, TAKE 1 TABLET AT BEDTIME   NEEDS FOLLOW-UP APPOINTMENTBEFORE MORE REFILLS ARE    AUTHORIZED, Disp: 90 tablet, Rfl: 2   Exam: Current vital signs: BP 134/86   Pulse 66   Temp  98.6 F (37 C) (Oral)   Resp 16   Ht 6\' 2"  (1.88 m)   Wt 119.4 kg   SpO2 97%   BMI 33.80 kg/m  Vital signs in last 24 hours: Temp:  [98.6 F (37 C)] 98.6 F (37 C) (04/09 2139) Pulse Rate:  [59-66] 66 (04/09 2145) Resp:  [16-22] 16 (04/09 2145) BP: (113-134)/(80-86) 134/86 (04/09 2145) SpO2:  [95 %-97 %] 97 % (04/09 2145) Weight:  [119.4 kg] 119.4 kg (04/09 2118) General: Awake alert in no distress HEENT: Normocephalic, atraumatic, dry mucous membranes CVS: Regular rate rhythm Abdomen: Soft nondistended nontender Lungs: Clear Extremities: Warm well perfused Neurological exam Mental status: Awake alert oriented x3. Language: Speech is moderately dysarthric, naming fluency comprehension and repetition are all intact. Cranial nerves: Pupils equal round reactive to light, there is a right gaze preference and he cannot cross the midline to look to the left.  Unreliable confrontation exam of visual fields with some left visual field deficit but that is inconsistent.  Left lower facial droop.  Auditory acuity intact.  Tongue and palate midline. Motor exam: There is no vertical drift in any of the extremities but left upper and lower extremity are 4+/5, when compared to the right side which is 5/5. Sensory exam: Diminished on the left Coordination: No dysmetria Gait testing was deferred at this time NIH stroke scale 1a Level of Conscious.: 0 1b LOC Questions: 0 1c LOC Commands: 0 2 Best Gaze: 0 3 Visual: 0 4 Facial Palsy: 1 5a Motor Arm - left: 0 5b Motor Arm - Right: 0 6a Motor Leg - Left: 0 6b Motor Leg - Right: 0 7 Limb Ataxia: 0 8 Sensory: 1 9 Best Language: 0 10 Dysarthria: 2 11 Extinct. and Inatten.: 0 TOTAL: 4  Labs I have reviewed labs in epic and the results pertinent to this consultation are:  CBC    Component Value Date/Time   WBC 6.6 02/22/2020 2100   RBC 4.45 02/22/2020 2100   HGB 13.6 02/22/2020 2113   HCT 40.0 02/22/2020 2113   PLT 148 (L) 02/22/2020  2100   MCV 95.1 02/22/2020 2100   MCH 31.5 02/22/2020 2100   MCHC 33.1 02/22/2020 2100   RDW 12.3 02/22/2020 2100   LYMPHSABS 2.2 02/22/2020 2100   MONOABS 0.7 02/22/2020 2100   EOSABS 0.1 02/22/2020 2100   BASOSABS 0.0 02/22/2020 2100    CMP     Component Value Date/Time   NA 140 02/22/2020 2113   K 3.8 02/22/2020 2113   CL 106 02/22/2020 2113   CO2 22 02/22/2020 2100   GLUCOSE 95 02/22/2020 2113   GLUCOSE 105 (H) 11/01/2006 1056   BUN 15 02/22/2020 2113   CREATININE 1.00 02/22/2020 2113   CALCIUM 9.0 02/22/2020 2100   PROT 6.6 02/22/2020  2100   ALBUMIN 3.9 02/22/2020 2100   AST 20 02/22/2020 2100   ALT 22 02/22/2020 2100   ALKPHOS 73 02/22/2020 2100   BILITOT 0.5 02/22/2020 2100   GFRNONAA >60 02/22/2020 2100   GFRAA >60 02/22/2020 2100    Imaging I have reviewed the images obtained:  CT-scan of the brain stat-6 cc right thalamic hemorrhage with minimal mass-effect on the lateral ventricle and surrounding mild edema. CTA head and neck with no emergent LVO.  5-minute delay CT head with no spot sign.  No vascular malformation identified Review of prior MRI with some chronic microhemorrhages but not clearly consistent with cerebral amyloid angiopathy.  Assessment:  69 year old man past history of multiple lacunar infarcts bilaterally, prior small hypertensive bleeds and bilateral deep gray matter structures with most recent ICH in the right thalamus/posterior limb of internal capsule in 2019 with questionable mild residual left-sided weakness presenting for sudden worsening of dysarthria and left-sided weakness.  On examination, he has left hemiparesis and left facial droop along with diminished sensation on the left side.  CT head revealing of 6 cc right thalamic hemorrhage.  His blood pressures on arrival were not severely elevated and were in the normal range. The pattern of prior strokes and bleeds is very consistent with a hypertensive etiology but this current bleed is  not a typical hypertensive bleed-although location is, but he was not hypertensive on arrival. He will be admitted in the neurological ICU for further work-up and management  Assessment:   Plan: Subcortical ICH, nontraumatic  Acuity: Acute Laterality: Right thalamus Current suspected etiology: Hypertensive Treatment: -Admit to neurological ICU -ICH Score: 0 -ICH Volume: 6 cc -BP control goal SYS<140 -PT/OT/ST  -neuromonitoring -Repeat imaging in 6 hours-have ordered MRI brain with and without contrast to be done in 6 hours.  If not able to, do head CT.  CNS Cerebral edema Compression of brain -Close neuro monitoring  Dysarthria Dysphagia following ICH  -NPO until cleared by speech -ST  Hemiplegia and hemiparesis following nontraumatic intracerebral hemorrhage affecting left non-dominant side  -Continue PT/OT/ST  RESP No active issues Monitor clinically  CV Essential hypertension Came in normotensive -Transthoracic echo -Systolic blood pressure goal less than 140.  Use labetalol as needed and if that does not get the blood pressure goal, use Cleviprex drip.  GI/GU Normal renal function -Check labs in the a.m. -Gentle hydration-normal saline 75 cc an hour  HEME No evidence of coagulopathy Normal H&H Platelet count mildly decreased at 148,000. -Repeat CBC in the morning   ENDO Check A1c  Fluid/Electrolyte Disorders Check labs in the morning. Normal electrolytes for now. If any abnormality noted in the a.m., replete as necessary  ID Possible Aspiration PNA -CXR -NPO -Monitor  Nutrition E66.9 Obesity  -diet consult  Prophylaxis DVT: SCDs-do not use antiplatelets or anticoagulants secondary to [place]. GI: PPI Bowel: Docusate/senna  Dispo: To be determined  Diet: NPO until cleared by speech/bedside stroke swallow screen  Code Status: Full Code    THE FOLLOWING WERE PRESENT ON ADMISSION: Intracerebral hemorrhage, hemiparesis, dysphagia,  dysarthria, cerebral edema, essential hypertension, possible aspiration pneumonia  Plan discussed with the EDP in the ER.  -- Amie Portland, MD [facility] Pager: [phone] If 7pm to 7am, please call on call as listed on AMION.  CRITICAL CARE ATTESTATION Performed by: Amie Portland, MD Total critical care time: 45 minutes Critical care time was exclusive of separately billable procedures and treating other patients and/or supervising APPs/Residents/Students Critical care was necessary to treat or  prevent imminent or life-threatening deterioration due to intracerebral hemorrhage This patient is critically ill and at significant risk for neurological worsening and/or death and care requires constant monitoring. Critical care was time spent personally by me on the following activities: development of treatment plan with patient and/or surrogate as well as nursing, discussions with consultants, evaluation of patient's response to treatment, examination of patient, obtaining history from patient or surrogate, ordering and performing treatments and interventions, ordering and review of laboratory studies, ordering and review of radiographic studies, pulse oximetry, re-evaluation of patient's condition, participation in multidisciplinary rounds and medical decision making of high complexity in the care of this patient.

## 2020-02-23 ENCOUNTER — Inpatient Hospital Stay (HOSPITAL_COMMUNITY): Payer: Medicare Other

## 2020-02-23 DIAGNOSIS — I6389 Other cerebral infarction: Secondary | ICD-10-CM

## 2020-02-23 LAB — ECHOCARDIOGRAM COMPLETE
Height: 74 in
Weight: 4074.1 oz

## 2020-02-23 LAB — MRSA PCR SCREENING: MRSA by PCR: NEGATIVE

## 2020-02-23 MED ORDER — GADOBUTROL 1 MMOL/ML IV SOLN
10.0000 mL | Freq: Once | INTRAVENOUS | Status: AC | PRN
  Administered 2020-02-23: 10 mL via INTRAVENOUS

## 2020-02-23 MED ORDER — TRAZODONE HCL 50 MG PO TABS
50.0000 mg | ORAL_TABLET | Freq: Every day | ORAL | Status: DC
  Administered 2020-02-23 – 2020-02-26 (×4): 50 mg via ORAL
  Filled 2020-02-23 (×4): qty 1

## 2020-02-23 NOTE — Progress Notes (Signed)
  Echocardiogram 2D Echocardiogram has been performed.  Bobbye Charleston 02/23/2020, 10:19 AM

## 2020-02-23 NOTE — Progress Notes (Signed)
Rehab Admissions Coordinator Note:  Per PT recommendation, this patient was screened by Raechel Ache for appropriateness for an Inpatient Acute Rehab Consult.  At this time, we are recommending Inpatient Rehab consult. AC will contact attending service to request order.   Raechel Ache 02/23/2020, 2:37 PM  I can be reached at [phone].

## 2020-02-23 NOTE — Evaluation (Signed)
Physical Therapy Evaluation Patient Details Name: Richard Vaughn MRN: [id number] DOB: 12/23/1950 Today's Date: 02/23/2020   History of Present Illness  69 y.o. male past medical history of hypertension, hyperlipidemia, Barrett's esophagus, multiple lacunar infarcts, prior small hypertensive bleeds in bilateral deep gray matter structures with most recent [place] in the right thalamus/posterior limb of internal capsule in 2019 with mild residual left-sided weakness presenting to the emergency room for sudden onset of worsening dysarthria and left-sided weakness, vision changes. Pt found to have R thalamic hemorrhage.  Clinical Impression  Pt presents to PT wit deficits in functional mobility, gait, balance, endurance, cognition, safety awareness, strength. Pt with significant L foot drag during gait and L weakness greatly affecting mobility. Pt with reduced awareness of L weakness and poor insight into how this affects his balance. Pt is impulsive during session, attempting to walk to bathroom prior to PT arranging lines, leading to LOB and need for PT to assist in descent back to bed. Pt will continue to benefit from PT POC to reduce falls risk and restore independence in mobility.    Follow Up Recommendations [name];Supervision/Assistance - 24 hour    Equipment Recommendations  Wheelchair (measurements PT)(wheelchair if D/C today, hopeful for progression)    Recommendations for Other Services Rehab consult     Precautions / Restrictions Precautions Precautions: Fall Restrictions Weight Bearing Restrictions: No      Mobility  Bed Mobility Overal bed mobility: Needs Assistance Bed Mobility: Supine to Sit     Supine to sit: Supervision        Transfers Overall transfer level: Needs assistance Equipment used: 1 person hand held assist Transfers: Sit to/from Stand Sit to Stand: Min assist;From elevated surface            Ambulation/Gait Ambulation/Gait assistance: Mod  assist Gait Distance (Feet): 15 Feet Assistive device: Rolling walker (2 wheeled) Gait Pattern/deviations: Step-to pattern;Decreased step length - left;Decreased dorsiflexion - left;Staggering left Gait velocity: reduced Gait velocity interpretation: <1.8 ft/sec, indicate of risk for recurrent falls General Gait Details: pt with foot drag of LLE, often leaving left foot behind and with impaired ability to clear LLE. Pt experiences one significant LOB requiring assistance to control descent onto bed when L foot was crossed behind R foot during gait. Pt also requires cueing for gait sequencing and RW management during session  Stairs            Wheelchair Mobility    Modified Rankin (Stroke Patients Only) Modified Rankin (Stroke Patients Only) Pre-Morbid Rankin Score: No significant disability Modified Rankin: Moderately severe disability     Balance Overall balance assessment: Needs assistance Sitting-balance support: No upper extremity supported;Feet supported Sitting balance-Leahy Scale: Fair Sitting balance - Comments: minG at edge of bed   Standing balance support: Bilateral upper extremity supported Standing balance-Leahy Scale: Poor Standing balance comment: min-modA for standing balance with BUe support of RW                             Pertinent Vitals/Pain Pain Assessment: No/denies pain    Home Living Family/patient expects to be discharged to:: Private residence Living Arrangements: Spouse/significant other Available Help at Discharge: Family;Available 24 hours/day Type of Home: House Home Access: Stairs to enter Entrance Stairs-Rails: Right Entrance Stairs-Number of Steps: 3 Home Layout: One level Home Equipment: Walker - 2 wheels;Cane - single point;Bedside commode      Prior Function Level of Independence: Independent  Comments: pt reports he walks as his hobby     Hand Dominance   Dominant Hand: Right    Extremity/Trunk  Assessment   Upper Extremity Assessment Upper Extremity Assessment: LUE deficits/detail LUE Deficits / Details: grossly 4/5    Lower Extremity Assessment Lower Extremity Assessment: LLE deficits/detail LLE Deficits / Details: grossly 4-/5    Cervical / Trunk Assessment Cervical / Trunk Assessment: Normal  Communication   Communication: (dysarthric)  Cognition Arousal/Alertness: Awake/alert Behavior During Therapy: Impulsive Overall Cognitive Status: Impaired/Different from baseline Area of Impairment: Awareness;Problem solving;Safety/judgement;Following commands;Orientation                 Orientation Level: Disoriented to;Time     Following Commands: Follows one step commands consistently;Follows multi-step commands with increased time Safety/Judgement: Decreased awareness of safety;Decreased awareness of deficits Awareness: Emergent Problem Solving: Slow processing;Requires verbal cues;Requires tactile cues        General Comments General comments (skin integrity, edema, etc.): VSS, pt does report some dizziness intermittently durign session but BP sable at 139/68.    Exercises     Assessment/Plan    PT Assessment Patient needs continued PT services  PT Problem List Decreased strength;Decreased activity tolerance;Decreased balance;Decreased mobility;Decreased cognition;Decreased knowledge of use of DME;Decreased safety awareness;Decreased knowledge of precautions       PT Treatment Interventions DME instruction;Gait training;Stair training;Functional mobility training;Therapeutic activities;Therapeutic exercise;Balance training;Neuromuscular re-education;Cognitive remediation;Patient/family education    PT Goals (Current goals can be found in the Care Plan section)  Acute Rehab PT Goals Patient Stated Goal: to return to independence PT Goal Formulation: With patient Time For Goal Achievement: 03/08/20 Potential to Achieve Goals: Good Additional  Goals Additional Goal #1: Pt will maintain dynamic standing balance within 10 inches of base of support with supervision and unilateral UE support of the LRAD    Frequency Min 4X/week   Barriers to discharge        Co-evaluation               AM-PAC PT "6 Clicks" Mobility  Outcome Measure Help needed turning from your back to your side while in a flat bed without using bedrails?: None Help needed moving from lying on your back to sitting on the side of a flat bed without using bedrails?: None Help needed moving to and from a bed to a chair (including a wheelchair)?: A Lot Help needed standing up from a chair using your arms (e.g., wheelchair or bedside chair)?: A Little Help needed to walk in hospital room?: A Lot Help needed climbing 3-5 steps with a railing? : Total 6 Click Score: 16    End of Session   Activity Tolerance: Patient tolerated treatment well Patient left: in chair;with call bell/phone within reach;with chair alarm set Nurse Communication: Mobility status PT Visit Diagnosis: Unsteadiness on feet (R26.81);Other abnormalities of gait and mobility (R26.89);Muscle weakness (generalized) (M62.81);Other symptoms and signs involving the nervous system (R29.898)    Time: [id number] PT Time Calculation (min) (ACUTE ONLY): 30 min   Charges:   PT Evaluation $PT Eval Moderate Complexity: 1 Mod PT Treatments $Gait Training: 8-22 mins        Zenaida Niece, PT, DPT Acute Rehabilitation Pager: [phone]   Zenaida Niece 02/23/2020, 1:34 PM

## 2020-02-23 NOTE — Progress Notes (Signed)
STROKE TEAM PROGRESS NOTE   HISTORY OF PRESENT ILLNESS (per record) Richard Vaughn is a 69 y.o. male past medical history of hypertension, hyperlipidemia, Barrett's esophagus, multiple lacunar infarcts, prior small hypertensive bleeds in bilateral deep gray matter structures with most recent [place] in the right thalamus/posterior limb of internal capsule in 2019 with mild residual left-sided weakness presenting to the emergency room for sudden onset of worsening dysarthria and left-sided weakness.  He says that he was with his wife, having normal conversation and suddenly around 7:45 PM today started noticing extremely slurred speech.  His speech had recovered after the last stroke and was not slurred-last neurology notes from 09/03/2018 do mention mild dysarthria but no motor deficits. He also noted worsening weakness on the left arm and leg making it difficult to control the arm and leg and walk. He was evaluated by EMS, brought in as an acute code stroke, seen by me at the ER bridge emergently, stat head CT done that showed a 6 cc hemorrhage in the right thalamus with mild surrounding edema and minimal mass-effect.  There is no intraventricular extension or extra-axial extension at this time but it looks to be pressing on the lateral ventricle. He reports some mild pressure-like headache on the right side of his head. Also reports difficulty with his vision and facial drooping.  LKW: 7:45 PM today tpa given?: no, ICH Premorbid modified Rankin scale (mRS):1   INTERVAL HISTORY His RN is at the bedside.  He is awake and interactive but remains with dysarthria and mild weakness.  Blood pressure adequately controlled.  Vital signs are stable.  MRI scan shows stable appearance of the thalamic hemorrhage with mild cytotoxic edema    OBJECTIVE Vitals:   02/23/20 0500 02/23/20 0600 02/23/20 0700 02/23/20 0800  BP: 94/83 (!) 131/95 (!) 123/100 116/85  Pulse: (!) 48 (!) 50 (!) 58 (!) 50  Resp: 18 16 13  15   Temp:      TempSrc:      SpO2: 95% 94% 94% 98%  Weight:      Height:        CBC:  Recent Labs  Lab 02/22/20 2100 02/22/20 2113  WBC 6.6  --   NEUTROABS 3.7  --   HGB 14.0 13.6  HCT 42.3 40.0  MCV 95.1  --   PLT 148*  --     Basic Metabolic Panel:  Recent Labs  Lab 02/22/20 2100 02/22/20 2113  NA 139 140  K 4.1 3.8  CL 109 106  CO2 22  --   GLUCOSE 98 95  BUN 14 15  CREATININE 1.05 1.00  CALCIUM 9.0  --     Lipid Panel:     Component Value Date/Time   CHOL 125 02/22/2020 2100   CHOL 142 05/15/2018 1329   TRIG 86 02/22/2020 2100   TRIG 121 11/01/2006 1056   HDL 35 (L) 02/22/2020 2100   HDL 30 (L) 05/15/2018 1329   CHOLHDL 3.6 02/22/2020 2100   VLDL 17 02/22/2020 2100   LDLCALC 73 02/22/2020 2100   LDLCALC 87 05/15/2018 1329   HgbA1c:  Lab Results  Component Value Date   HGBA1C 5.3 04/15/2018   Urine Drug Screen:     Component Value Date/Time   LABOPIA NONE DETECTED 04/15/2018 1630   COCAINSCRNUR NONE DETECTED 04/15/2018 1630   LABBENZ NONE DETECTED 04/15/2018 1630   AMPHETMU NONE DETECTED 04/15/2018 1630   THCU POSITIVE (A) 04/15/2018 1630   LABBARB NONE DETECTED 04/15/2018 1630  Alcohol Level     Component Value Date/Time   ETH <10 04/15/2018 0127    IMAGING  CT Code Stroke CTA Head W/WO contrast CT Code Stroke CTA Neck W/WO contrast 02/22/2020 IMPRESSION:  1. Stable arterial findings since a 2017 CTA: No large vessel occlusion. Minimal atherosclerosis in the head and neck - although there is at least moderate stenosis suspected at the origin of the non-dominant left vertebral artery origin.  2. No CTA spot sign associated with the right thalamic hemorrhage.  3. 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 This appears not significantly changed since a 2016 brain  MRI. - a 14 mm left thyroid lobe nodule meets consensus criteria for dedicated Thyroid Ultrasound follow-up.   MR BRAIN W WO CONTRAST 02/23/2020 IMPRESSION:  1. 2.1 x 2.1 x 2.2 cm acute intraparenchymal hemorrhage centered at the right thalamus, unchanged from previous. Associated mild surrounding vasogenic edema and localized regional mass effect, with up to 3 mm of localized right-to-left shift. No underlying mass lesion, abnormal enhancement, or other abnormality identified.  2. Multiple remote lacunar infarcts involving the left basal ganglia and thalamus.  3. Underlying age-related cerebral atrophy with advanced chronic microvascular ischemic disease.  4. Left mastoid and middle ear effusion.   CT HEAD CODE STROKE WO CONTRAST 02/22/2020 IMPRESSION:  1. Positive for acute right thalamic hemorrhage (6 mL) with mild surrounding edema and minimal mass effect. No intraventricular or extra-axial extension at this time.  2. Underlying advanced chronic small vessel disease.   Transthoracic Echocardiogram  00/00/2021 Pending  ECG - SR rate 64 BPM. (See cardiology reading for complete details)  PHYSICAL EXAM Blood pressure 116/85, pulse (!) 50, temperature 97.8 F (36.6 C), temperature source Oral, resp. rate 15, height 6\' 2"  (1.88 m), weight 115.5 kg, SpO2 98 %. Middle-aged African-American male not in distress.  . Afebrile. Head is nontraumatic. Neck is supple without bruit.    Cardiac exam no murmur or gallop. Lungs are clear to auscultation. Distal pulses are well felt. Neurological Exam : Patient is drowsy but can arouse easily.  Is interactive and follows commands well.  Moderate dysarthria.  Extraocular movements are full range without nystagmus.  Blinks to threat bilaterally.  Left lower facial weakness.  Tongue midline.  Motor system exam shows no upper or lower extremity drift but has mild weakness of left grip intrinsic hand muscles.  Mild left finger-to-nose and knee to heel  dysmetria.  Slight decrease sensation in the left face and arm.  Reflexes are symmetric.  Plantars are downgoing.  Gait not tested.     ASSESSMENT/PLAN [name] is a 69 y.o. 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 He did not receive IV t-PA due to [place].  Stroke: acute right thalamic hemorrhage (6 mL) with mild surrounding edema and minimal mass effect  Resultant dysarthria and left face and extremity weakness  Code Stroke CT Head - Positive for acute right thalamic hemorrhage (6 mL) with mild surrounding edema and minimal mass effect. No intraventricular or extra-axial extension at this  time. Underlying advanced chronic small vessel disease.   CT head - not ordered  MRI head W & WO - 2.1 x 2.1 x 2.2 cm acute intraparenchymal hemorrhage centered at the right thalamus, unchanged from previous. Associated mild surrounding vasogenic edema and localized regional mass effect, with up to 3 mm of localized right-to-left shift. No underlying mass lesion, abnormal enhancement, or other abnormality identified. Multiple remote lacunar infarcts involving the left basal ganglia and thalamus.   MRA head - not ordered  CTA H&N - No large vessel occlusion. Minimal atherosclerosis in the head and neck - although there is at least moderate stenosis suspected at the origin of the non-dominant left vertebral artery origin.  CT Perfusion - not ordered Carotid Doppler - CTA neck performed - carotid dopplers not indicated.  2D Echo - pending  Lacey Jensen Virus 2 - negative  LDL - 73  HgbA1c - pending  UDS - not ordered  VTE  prophylaxis - SCDs Diet  Diet Order            Diet NPO time specified  Diet effective now              aspirin 81 mg daily prior to admission, now on No antithrombotic  Ongoing aggressive stroke risk factor management  Therapy recommendations:  pending  Disposition:  Pending  Hypertension  Home BP meds: Norvasc ; Benicar  Current BP meds: Labetalol  Systolic blood pressure somewhat low at times  . SBP goal < 140 mm Hg initially . Long-term BP goal normotensive  Hyperlipidemia  Home Lipid lowering medication: none  LDL 73, goal < 70  Current lipid lowering medication: none (statin contraindicated with ICH)  Continue statin at discharge  Other Stroke Risk Factors  Advanced age  Former cigarette smoker - quit  Obesity, Body mass index is 32.69 kg/m., recommend weight loss, diet and exercise as appropriate   Hx stroke/TIA  Family hx stroke (father and sisters)  Substance Abuse hx - opiods  Other Active Problems  Code status - Full code  14 mm left thyroid lobe nodule meets consensus criteria for dedicated Thyroid Ultrasound follow-up.  Bradycardia - 59's - 25's     Hospital day # 1 He presented with hypertensive right thalamic hemorrhage and remains at risk for neurological worsening, hematoma expansion and development of hydrocephalus.  Recommend close neurological monitoring with strict blood pressure control with systolic goal below [id number] for 24 hours and then below 160.  Mobilize out of bed.  Therapy consults.  Speech therapy for swallow eval and resume home medications.  Hold aspirin.  Check echocardiogram and hemoglobin A1c. This patient is critically ill and at significant risk of neurological worsening, death and care requires constant monitoring of vital signs, hemodynamics,respiratory and cardiac monitoring, extensive review of multiple databases, frequent neurological assessment, discussion with family, other specialists and medical decision  making of high complexity.I have made any additions or clarifications directly to the above note.This critical care time does not reflect procedure time, or teaching time or supervisory time of PA/NP/Med Resident etc but could involve care discussion time.  I spent 30 minutes of neurocritical care time  in the care of  this patient.    Antony Contras, MD  To contact Stroke Continuity provider, please refer to [email or web address] After hours, contact General Neurology

## 2020-02-23 NOTE — Plan of Care (Signed)
MRI with stable bleed.   -- Amie Portland, MD [facility] Pager: [phone] If 7pm to 7am, please call on call as listed on [facility]ION.

## 2020-02-23 NOTE — Evaluation (Signed)
Clinical/Bedside Swallow Evaluation Patient Details  Name: Richard Vaughn MRN: [id number] Date of Birth: 02-24-1951  Today's Date: 02/23/2020 Time: SLP Start Time (ACUTE ONLY): 1340 SLP Stop Time (ACUTE ONLY): 1355 SLP Time Calculation (min) (ACUTE ONLY): 15 min  Past Medical History:  Past Medical History:  Diagnosis Date  . Barrett's esophagus   . Chronic back pain   . Gastritis    Mild  . Gastroparesis   . GERD (gastroesophageal reflux disease)   . Headache   . Hyperglycemia   . Hypertension   . Nephrolithiasis   . Renal cyst   . Stricture and stenosis of esophagus   . Stroke ([place]) 07-2015  . Vision abnormalities    Past Surgical History:  Past Surgical History:  Procedure Laterality Date  . [place] SURGERY  2012  . COLONOSCOPY    . [place] SURGERY  2005, 2010   replaced L4 and L5  . UPPER GASTROINTESTINAL ENDOSCOPY     HPI:  Richard Vaughn is a 69 y.o. male with PMHx HTN, GERD, previous stroke who presents to the ED via EMS with complaint of slurred speech and left sided weakness that began at approximately 7:45 PM.  Pt with hx of both ischemic stroke and ICHs.  Head CT reported a right thalamic hemorrhage with mild surrounding edema and minimal mass effect.  Pt has been seen by acute and outpatient SLP in the past with most recent diet recommendations for Dysphagia 3 (soft) solids and thin liquids.     Assessment / Plan / Recommendation Clinical Impression  Pt was seen for a bedside swallow evaluation and he presents with minimal oral dysphagia.  He was encountered awake/alert with wife present at bedside.  They reported that the pt consumes regular solids and thin liquids without difficulty at baseline.  Oral mech exam was remarkable for reduced labial ROM on the left and min facial asymmetry on the L.  Mild dysarthria was also noted in conversational speech.  Pt consumed trials of thin liquid, puree, and regular solids.  Pt exhibited good bolus acceptance,  timely AP transport, and consistent hyolaryngeal elevation/excursion with all trials.  Mastication of regular solids was mildly prolonged (likely secondary to missing dentition), but mastication was effective with only trace oral residue observed on the pt's posterior lingual surface.  Spoke with pt regarding Dysphagia 3 (soft) solids, but he stated that he would prefer regular solids.  Therefore, recommend regular solids and thin liquids with medications administered whole with liquid.  SLP will briefly f/u to monitor diet tolerance.    SLP Visit Diagnosis: Dysphagia, oral phase (R13.11)    Aspiration Risk  Mild aspiration risk    Diet Recommendation Regular;Thin liquid   Liquid Administration via: Cup;Straw Medication Administration: Whole meds with liquid Supervision: Patient able to self feed Compensations: Slow rate;Small sips/bites Postural Changes: Seated upright at 90 degrees    Other  Recommendations Oral Care Recommendations: Oral care BID   Follow up Recommendations None(for dysphagia )      Frequency and Duration min 1 x/week  1 week           Swallow Study   General Date of Onset: 02/23/20 HPI: Richard Vaughn is a 69 y.o. male with PMHx HTN, GERD, previous stroke who presents to the ED via EMS with complaint of slurred speech and left sided weakness that began at approximately 7:45 PM.  Pt with hx of of both ischemic stroke and ICHs.  Head CT reported a right  thalamic hemorrhage with mild surrounding edema and minimal mass effect.  Pt has been seen by acute and outpatient SLP in the past with most recent diet recommendations for Dysphagia 3 (soft) solids and thin liquids.   Type of Study: Bedside Swallow Evaluation Previous Swallow Assessment: See HPI Diet Prior to this Study: NPO Temperature Spikes Noted: Yes Respiratory Status: Room air History of Recent Intubation: No Behavior/Cognition: Alert;Cooperative;Pleasant mood Oral Cavity Assessment: Within Functional  Limits Oral Care Completed by SLP: No Oral Cavity - Dentition: Missing dentition Vision: Functional for self-feeding Self-Feeding Abilities: Able to feed self Patient Positioning: Upright in chair Baseline Vocal Quality: Normal Volitional Swallow: Able to elicit    Oral/Motor/Sensory Function Overall Oral Motor/Sensory Function: Mild impairment Facial ROM: Reduced left Facial Symmetry: Abnormal symmetry left Facial Sensation: Within Functional Limits Lingual ROM: Within Functional Limits Lingual Symmetry: Within Functional Limits   Ice Chips Ice chips: Not tested   Thin Liquid Thin Liquid: Within functional limits Presentation: Cup;Straw    Nectar Thick Nectar Thick Liquid: Not tested   Honey Thick Honey Thick Liquid: Not tested   Puree Puree: Within functional limits Presentation: Spoon   Solid     Solid: Impaired Presentation: Self Fed Oral Phase Impairments: Impaired mastication Oral Phase Functional Implications: Impaired mastication     Colin Mulders., M.S., CCC-SLP Acute Rehabilitation Services Office: [phone]  [place] 02/23/2020,2:09 PM

## 2020-02-24 ENCOUNTER — Inpatient Hospital Stay (HOSPITAL_COMMUNITY): Payer: Medicare Other

## 2020-02-24 LAB — URINALYSIS, COMPLETE (UACMP) WITH MICROSCOPIC
Bacteria, UA: NONE SEEN
Bilirubin Urine: NEGATIVE
Glucose, UA: NEGATIVE mg/dL
Hgb urine dipstick: NEGATIVE
Ketones, ur: NEGATIVE mg/dL
Leukocytes,Ua: NEGATIVE
Nitrite: NEGATIVE
Protein, ur: NEGATIVE mg/dL
Specific Gravity, Urine: 1.023 (ref 1.005–1.030)
pH: 5 (ref 5.0–8.0)

## 2020-02-24 LAB — CBC
HCT: 43.9 % (ref 39.0–52.0)
Hemoglobin: 15 g/dL (ref 13.0–17.0)
MCH: 31.1 pg (ref 26.0–34.0)
MCHC: 34.2 g/dL (ref 30.0–36.0)
MCV: 91.1 fL (ref 80.0–100.0)
Platelets: 179 10*3/uL (ref 150–400)
RBC: 4.82 MIL/uL (ref 4.22–5.81)
RDW: 12.2 % (ref 11.5–15.5)
WBC: 7.3 10*3/uL (ref 4.0–10.5)
nRBC: 0 % (ref 0.0–0.2)

## 2020-02-24 LAB — BASIC METABOLIC PANEL
Anion gap: 9 (ref 5–15)
BUN: 11 mg/dL (ref 8–23)
CO2: 23 mmol/L (ref 22–32)
Calcium: 9.2 mg/dL (ref 8.9–10.3)
Chloride: 108 mmol/L (ref 98–111)
Creatinine, Ser: 0.92 mg/dL (ref 0.61–1.24)
GFR calc Af Amer: >60 mL/min (ref >60)
GFR calc non Af Amer: >60 mL/min (ref >60)
Glucose, Bld: 101 mg/dL — ABNORMAL HIGH (ref 70–99)
Potassium: 3.8 mmol/L (ref 3.5–5.1)
Sodium: 140 mmol/L (ref 135–145)

## 2020-02-24 MED ORDER — PANTOPRAZOLE SODIUM 40 MG PO TBEC
40.0000 mg | DELAYED_RELEASE_TABLET | Freq: Every day | ORAL | Status: DC
  Administered 2020-02-24 – 2020-02-27 (×4): 40 mg via ORAL
  Filled 2020-02-24 (×4): qty 1

## 2020-02-24 MED ORDER — AMLODIPINE BESYLATE 2.5 MG PO TABS
2.5000 mg | ORAL_TABLET | Freq: Every day | ORAL | Status: DC
  Administered 2020-02-24 – 2020-02-27 (×4): 2.5 mg via ORAL
  Filled 2020-02-24 (×4): qty 1

## 2020-02-24 MED ORDER — MORPHINE SULFATE ER 15 MG PO TBCR
15.0000 mg | EXTENDED_RELEASE_TABLET | Freq: Two times a day (BID) | ORAL | Status: DC
  Administered 2020-02-24 – 2020-02-27 (×7): 15 mg via ORAL
  Filled 2020-02-24 (×8): qty 1

## 2020-02-24 MED ORDER — IRBESARTAN 75 MG PO TABS
37.5000 mg | ORAL_TABLET | Freq: Every day | ORAL | Status: DC
  Administered 2020-02-24 – 2020-02-27 (×4): 37.5 mg via ORAL
  Filled 2020-02-24 (×4): qty 0.5

## 2020-02-24 NOTE — Progress Notes (Signed)
STROKE TEAM PROGRESS NOTE   INTERVAL HISTORY His RN is at the bedside.  He is awake and interactive but remains with dysarthria and mild weakness.  Blood pressure adequately controlled.  Vital signs are stable.  Echo was normal.  Therapist recommended inpatient rehab.   OBJECTIVE Vitals:   02/24/20 0530 02/24/20 0600 02/24/20 0630 02/24/20 0730  BP: 130/88 118/89 (!) 121/91 131/77  Pulse:      Resp: 15 14 17  (!) 9  Temp:      TempSrc:      SpO2:      Weight:      Height:        CBC:  Recent Labs  Lab 02/22/20 2100 02/22/20 2113  WBC 6.6  --   NEUTROABS 3.7  --   HGB 14.0 13.6  HCT 42.3 40.0  MCV 95.1  --   PLT 148*  --     Basic Metabolic Panel:  Recent Labs  Lab 02/22/20 2100 02/22/20 2113  NA 139 140  K 4.1 3.8  CL 109 106  CO2 22  --   GLUCOSE 98 95  BUN 14 15  CREATININE 1.05 1.00  CALCIUM 9.0  --     Lipid Panel:     Component Value Date/Time   CHOL 125 02/22/2020 2100   CHOL 142 05/15/2018 1329   TRIG 86 02/22/2020 2100   TRIG 121 11/01/2006 1056   HDL 35 (L) 02/22/2020 2100   HDL 30 (L) 05/15/2018 1329   CHOLHDL 3.6 02/22/2020 2100   VLDL 17 02/22/2020 2100   LDLCALC 73 02/22/2020 2100   LDLCALC 87 05/15/2018 1329   HgbA1c:  Lab Results  Component Value Date   HGBA1C 5.3 04/15/2018   Urine Drug Screen:     Component Value Date/Time   LABOPIA NONE DETECTED 04/15/2018 1630   COCAINSCRNUR NONE DETECTED 04/15/2018 1630   LABBENZ NONE DETECTED 04/15/2018 1630   AMPHETMU NONE DETECTED 04/15/2018 1630   THCU POSITIVE (A) 04/15/2018 1630   LABBARB NONE DETECTED 04/15/2018 1630    Alcohol Level     Component Value Date/Time   ETH <10 04/15/2018 0127    IMAGING  CT Code Stroke CTA Head W/WO contrast CT Code Stroke CTA Neck W/WO contrast 02/22/2020 IMPRESSION:  1. Stable arterial findings since a 2017 CTA: No large vessel occlusion. Minimal atherosclerosis in the head and neck - although there is at least moderate stenosis suspected at  the origin of the non-dominant left vertebral artery origin.  2. No CTA spot sign associated with the right thalamic hemorrhage.  3. 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 This appears not significantly changed since a 2016 brain MRI. - a 14 mm left thyroid lobe nodule meets consensus criteria for dedicated Thyroid Ultrasound follow-up.   MR BRAIN W WO CONTRAST 02/23/2020 IMPRESSION:  1. 2.1 x 2.1 x 2.2 cm acute intraparenchymal hemorrhage centered at the right thalamus, unchanged from previous. Associated mild surrounding vasogenic edema and localized regional mass effect, with up to 3 mm of localized right-to-left shift. No underlying mass lesion, abnormal enhancement, or other abnormality identified.  2. Multiple remote lacunar infarcts involving the left basal ganglia and thalamus.  3. Underlying age-related cerebral atrophy with advanced chronic microvascular ischemic disease.  4. Left mastoid and middle ear effusion.  CT HEAD CODE STROKE WO CONTRAST 02/22/2020 IMPRESSION:  1. Positive for acute right thalamic hemorrhage (6 mL) with mild surrounding edema and minimal mass effect. No intraventricular or extra-axial extension at this time.  2. Underlying advanced chronic small vessel disease.   Transthoracic Echocardiogram  02/23/2020 IMPRESSIONS  1. Left ventricular ejection fraction, by estimation, is 60 to 65%. The  left ventricle has normal function. The left ventricle has no regional  wall motion abnormalities. There is moderate left ventricular hypertrophy  of the basal-septal segment. Left  ventricular diastolic parameters were normal.  2. Right ventricular systolic function is normal. The right ventricular  size is normal. Tricuspid regurgitation signal is inadequate for assessing PA pressure.  3. The  mitral valve is normal in structure. Trivial mitral valve  regurgitation. No evidence of mitral stenosis.  4. The aortic valve was not well visualized. Aortic valve regurgitation  is not visualized. Mild aortic valve sclerosis is present, with no  evidence of aortic valve stenosis.  5. The inferior vena cava is dilated in size with >50% respiratory  variability, suggesting right atrial pressure of 8 mmHg.   ECG - SR rate 64 BPM. (See cardiology reading for complete details)  PHYSICAL EXAM Blood pressure 131/77, pulse 70, temperature 99.1 F (37.3 C), temperature source Axillary, resp. rate (!) 9, height 6\' 2"  (1.88 m), weight 115.5 kg, SpO2 95 %. Middle-aged African-American male not in distress.  . Afebrile. Head is nontraumatic. Neck is supple without bruit.    Cardiac exam no murmur or gallop. Lungs are clear to auscultation. Distal pulses are well felt. Neurological Exam : Patient is awake and interactive and follows commands well.  Mild dysarthria.  Extraocular movements are full range without nystagmus.  Blinks to threat bilaterally.  Left lower facial weakness.  Tongue midline.  Motor system exam shows no upper or lower extremity drift but has mild weakness of left grip intrinsic hand muscles.  Mild left finger-to-nose and knee to heel dysmetria.  Slight decrease sensation in the left face and arm.  Reflexes are symmetric.  Plantars are downgoing.  Gait not tested.     ASSESSMENT/PLAN [name] is a 69 y.o. 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 He did not receive IV t-PA due to [place].  Stroke: acute right  thalamic hemorrhage (6 mL) with mild surrounding edema and minimal mass effect  Resultant dysarthria and left face and extremity weakness  Code Stroke CT Head - Positive for acute right thalamic hemorrhage (6 mL) with mild surrounding edema and minimal mass effect. No intraventricular or extra-axial extension at this time. Underlying advanced chronic small vessel disease.   CT head - not ordered  MRI head W & WO - 2.1 x 2.1 x 2.2 cm acute intraparenchymal hemorrhage centered at the right thalamus, unchanged from previous. Associated mild surrounding vasogenic edema and localized regional mass effect, with up to 3 mm of localized right-to-left shift. No underlying mass lesion, abnormal enhancement, or other abnormality identified. Multiple remote lacunar infarcts involving the left basal ganglia and thalamus.   MRA head - not ordered  CTA H&N - No large vessel occlusion. Minimal atherosclerosis in the head and neck - although there is at least moderate stenosis suspected  at the origin of the non-dominant left vertebral artery origin.  CT Perfusion - not ordered Carotid Doppler - CTA neck performed - carotid dopplers not indicated.  2D Echo - EF 60 - 65%. No cardiac source of emboli identified.   Sars Corona Virus 2 - negative  LDL - 73  HgbA1c - pending  UDS - not ordered  VTE prophylaxis - SCDs Diet  Diet Order            Diet Heart Room service appropriate? Yes; Fluid consistency: Thin  Diet effective now              aspirin 81 mg daily prior to admission, now on No antithrombotic  Ongoing aggressive stroke risk factor management  Therapy recommendations:  [name] (Rehab MD consult placed)  Disposition:  Pending  Hypertension  Home BP meds: Norvasc ; Benicar  Current BP meds: Labetalol  Systolic blood pressure somewhat low at times  . SBP goal < 140 mm Hg initially . Long-term BP goal normotensive  Hyperlipidemia  Home Lipid lowering medication: none  LDL 73,  goal < 70  Current lipid lowering medication: none (statin contraindicated with ICH)  Continue statin at discharge  Other Stroke Risk Factors  Advanced age  Former cigarette smoker - quit  Obesity, Body mass index is 32.69 kg/m., recommend weight loss, diet and exercise as appropriate   Hx stroke/TIA  Family hx stroke (father and sisters)  Substance Abuse hx - opiods  Other Active Problems  Code status - Full code  14 mm left thyroid lobe nodule meets consensus criteria for dedicated Thyroid Ultrasound follow-up.  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   Bradycardia - 40's - 50's - improved  Mild temp - 99.1 ; Resp rate - 9 (monitor) (no CXR this admission will order)  Labs - pending   Hospital day # 2 Plan continue mobilization out of bed and ongoing therapies.  Continue strict blood pressure control with systolic blood pressure goal below 160.  Transfer to neurology floor bed when bed available.  Discussed with patient and with his wife yesterday afternoon and answered questions. This patient is critically ill and at significant risk of neurological worsening, death and care requires constant monitoring of vital signs, hemodynamics,respiratory and cardiac monitoring, extensive review of multiple databases, frequent neurological assessment, discussion with family, other specialists and medical decision making of high complexity.I have made any additions or clarifications directly to the above note.This critical care time does not reflect procedure time, or teaching time or supervisory time of PA/NP/Med Resident etc but could involve care discussion time.  I spent 30 minutes of neurocritical care time  in the care of  this patient.   Antony Contras, MD  To contact Stroke Continuity provider, please refer to [email or web address] After hours, contact General Neurology

## 2020-02-24 NOTE — Evaluation (Signed)
Occupational Therapy Evaluation Patient Details Name: Richard Vaughn MRN: [id number] DOB: Dec 26, 1950 Today's Date: 02/24/2020    History of Present Illness 69 y.o. 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 Pt found to have R thalamic hemorrhage.   Clinical Impression   Pt PTA: pt living with spouse and reports independence with ADL and mobility prior. Pt currently with decreased insight into deficits, decreased safety awareness and decreased ability to care for self safely.Pt modA for mobility overall and increased time for LLE weakness with decreased step height and ataxic gait. Pt stood x3 mins for ADL at sink before requiring rest break. Pt modA overall for ADL. Pt with moderate L sided weakness and would benefit from continued OT skilled services for ADL, mobility and safety in [name] setting. OT following acutely.      Follow Up Recommendations  [name]    Equipment Recommendations  Other (comment)(TBD)    Recommendations for Other Services Rehab consult     Precautions / Restrictions Precautions Precautions: Fall Restrictions Weight Bearing Restrictions: No      Mobility Bed Mobility Overal bed mobility: Needs Assistance Bed Mobility: Supine to Sit     Supine to sit: Supervision        Transfers Overall transfer level: Needs assistance Equipment used: Rolling walker (2 wheeled) Transfers: Sit to/from Stand Sit to Stand: Mod assist;From elevated surface         General transfer comment: modA for power up today; L side weak    Balance Overall balance assessment: Needs assistance Sitting-balance  support: No upper extremity supported;Feet supported Sitting balance-Leahy Scale: Fair     Standing balance support: Bilateral upper extremity supported;Single extremity supported;During functional activity Standing balance-Leahy Scale: Poor Standing balance comment: minA overall for stability at sink in standing for ADL                           ADL either performed or assessed with clinical judgement   ADL Overall ADL's : Needs assistance/impaired Eating/Feeding: Set up;Sitting   Grooming: Min guard;Standing   Upper Body Bathing: Min guard;Standing   Lower Body Bathing: Moderate assistance;Sitting/lateral leans;Sit to/from stand;Cueing for safety;Cueing for sequencing   Upper Body Dressing : Min guard;Sitting;Standing   Lower Body Dressing: Moderate assistance;Cueing for safety;Cueing for sequencing;Sitting/lateral leans;Sit to/from stand Lower Body Dressing Details (indicate cue type and reason): donning shoes, but unable to tie them Toilet Transfer: Moderate assistance;Ambulation;BSC;RW   Toileting- Clothing Manipulation and Hygiene: Minimal assistance;Cueing for safety;Sit to/from stand;Sitting/lateral lean       Functional mobility during ADLs: Moderate assistance;Cueing for safety;Cueing for sequencing;Rolling walker General ADL Comments: Pt with decreased insight into deficits, decreased safety awareness and decreased ability to care for self safely.     Vision Baseline Vision/History: Wears glasses Wears Glasses: At all times Patient Visual Report: No change from baseline Vision Assessment?: No apparent visual deficits     Perception     Praxis      Pertinent Vitals/Pain Pain Assessment: No/denies pain     Hand Dominance Right   Extremity/Trunk Assessment Upper Extremity Assessment Upper Extremity Assessment: LUE deficits/detail LUE Deficits / Details: ROM, WFLs; 4/5 MM grade;difficulty gripping RW LUE Coordination:  decreased fine motor    Lower Extremity Assessment Lower Extremity Assessment: Defer to PT evaluation LLE Deficits / Details: LLE decreased step height   Cervical / Trunk Assessment Cervical / Trunk Assessment: Normal   Communication Communication Communication: Expressive difficulties(dyarthric)   Cognition Arousal/Alertness: Awake/alert Behavior During Therapy: WFL for tasks assessed/performed;Flat affect Overall Cognitive Status: Impaired/Different from baseline Area of Impairment: Safety/judgement;Problem solving;Awareness                         Safety/Judgement: Decreased awareness of safety;Decreased awareness of deficits Awareness: Emergent Problem Solving: Slow processing;Requires verbal cues;Requires tactile cues General Comments: Pt A/O x4 today. Pt requiring cues for processing and unaware of worsening deficits that family comfirmed.   General Comments  Pt reports no dizziness today. VSS,    Exercises     Shoulder Instructions      Home Living Family/patient expects to be discharged to:: Private residence Living Arrangements: Spouse/significant other Available Help at Discharge: Family;Available 24 hours/day Type of Home: House Home Access: Stairs to enter [employer] of Steps: 3 Entrance Stairs-Rails: Right Home Layout: One level     Bathroom Shower/Tub: Occupational psychologist: Standard     Home Equipment: Environmental consultant - 2 wheels;Cane - single point;Bedside commode          Prior Functioning/Environment Level of Independence: Independent        Comments: pt reports he walks as his hobby        OT Problem List: Decreased strength;Decreased activity tolerance;Impaired balance (sitting and/or standing);Decreased coordination;Decreased cognition;Decreased safety awareness;Impaired UE functional use;Pain      OT Treatment/Interventions: Self-care/ADL training;Therapeutic exercise;Neuromuscular education;Energy conservation;Therapeutic  activities;Cognitive remediation/compensation;Visual/perceptual remediation/compensation;Patient/family education;Balance training    OT Goals(Current goals can be found in the care plan section) Acute Rehab OT Goals Patient Stated Goal: to return to independence OT Goal Formulation: With patient/family Time For Goal Achievement: 03/09/20 Potential to Achieve Goals: Good ADL Goals Pt Will Perform Grooming: with supervision;standing Pt Will Transfer to Toilet: with min guard assist;ambulating;bedside commode Pt/caregiver will Perform Home Exercise Program: Left upper extremity;Increased strength;With minimal assist Additional ADL Goal #1: Pt will complete x15 minute ADL task with 1 seated rest break from standing in order to increase activity tolerance. Additional ADL Goal #2: Pt will verbalize 3 fall prevention strategies to implement into treatment in order to increase safety awareness.  OT Frequency: Min 2X/week   Barriers to D/C:            Co-evaluation              AM-PAC OT "6 Clicks" Daily Activity     Outcome Measure Help from another person eating meals?: None Help from another person taking care of personal grooming?: A Little Help from another person toileting, which includes using toliet, bedpan, or urinal?: A Little Help from another person bathing (including washing, rinsing, drying)?: A Lot Help from another person to put on and taking off regular upper body clothing?: A Little Help from another person to put on and taking off regular lower body clothing?: A Lot 6 Click Score: 17   End of Session Equipment Utilized During Treatment: Gait belt;Rolling walker Nurse Communication: Mobility status  Activity Tolerance: Patient tolerated treatment well Patient left: in chair;with call bell/phone within reach;with chair alarm set;with family/visitor present  OT Visit Diagnosis: Unsteadiness on feet (R26.81);Muscle weakness (generalized) (M62.81);Other symptoms and  signs involving cognitive function;Hemiplegia and hemiparesis Hemiplegia - Right/Left: Left Hemiplegia - dominant/non-dominant: Non-Dominant  Hemiplegia - caused by: Cerebral infarction                Time: 1329-1400 OT Time Calculation (min): 31 min Charges:  OT General Charges $OT Visit: 1 Visit OT Evaluation $OT Eval Moderate Complexity: 1 Mod OT Treatments $Neuromuscular Re-education: 8-22 mins  Jefferey Pica, OTR/L Acute Rehabilitation Services Pager: [phone] Office: [phone]   Shanesha Bednarz C 02/24/2020, 4:38 PM

## 2020-02-25 LAB — GLUCOSE, CAPILLARY: Glucose-Capillary: 95 mg/dL (ref 70–99)

## 2020-02-25 LAB — HEMOGLOBIN A1C
Hgb A1c MFr Bld: 5.4 % (ref 4.8–5.6)
Mean Plasma Glucose: 108 mg/dL

## 2020-02-25 NOTE — Progress Notes (Signed)
STROKE TEAM PROGRESS NOTE   INTERVAL HISTORY Patient is sitting up in a bedside chair.  Blood pressure well controlled.  Therapy recommends rehab.  OBJECTIVE Vitals:   02/25/20 0400 02/25/20 0600 02/25/20 0700 02/25/20 0800  BP: 120/76 (!) 115/93  (!) 117/94  Pulse: (!) 48     Resp: 11 11 19 14   Temp: 98 F (36.7 C)   98.9 F (37.2 C)  TempSrc: Oral   Oral  SpO2: 95%     Weight:      Height:       CBC:  Recent Labs  Lab 02/22/20 2100 02/22/20 2100 02/22/20 2113 02/24/20 0824  WBC 6.6  --   --  7.3  NEUTROABS 3.7  --   --   --   HGB 14.0   < > 13.6 15.0  HCT 42.3   < > 40.0 43.9  MCV 95.1  --   --  91.1  PLT 148*  --   --  179   < > = values in this interval not displayed.   Basic Metabolic Panel:  Recent Labs  Lab 02/22/20 2100 02/22/20 2100 02/22/20 2113 02/24/20 0824  NA 139   < > 140 140  K 4.1   < > 3.8 3.8  CL 109   < > 106 108  CO2 22  --   --  23  GLUCOSE 98   < > 95 101*  BUN 14   < > 15 11  CREATININE 1.05   < > 1.00 0.92  CALCIUM 9.0  --   --  9.2   < > = values in this interval not displayed.   Lipid Panel:     Component Value Date/Time   CHOL 125 02/22/2020 2100   CHOL 142 05/15/2018 1329   TRIG 86 02/22/2020 2100   TRIG 121 11/01/2006 1056   HDL 35 (L) 02/22/2020 2100   HDL 30 (L) 05/15/2018 1329   CHOLHDL 3.6 02/22/2020 2100   VLDL 17 02/22/2020 2100   LDLCALC 73 02/22/2020 2100   LDLCALC 87 05/15/2018 1329   HgbA1c:  Lab Results  Component Value Date   HGBA1C 5.4 02/22/2020   Urine Drug Screen:     Component Value Date/Time   LABOPIA NONE DETECTED 04/15/2018 1630   COCAINSCRNUR NONE DETECTED 04/15/2018 1630   LABBENZ NONE DETECTED 04/15/2018 1630   AMPHETMU NONE DETECTED 04/15/2018 1630   THCU POSITIVE (A) 04/15/2018 1630   LABBARB NONE DETECTED 04/15/2018 1630    Alcohol Level     Component Value Date/Time   ETH <10 04/15/2018 0127    IMAGING  CT Code Stroke CTA Head W/WO contrast CT Code Stroke CTA Neck W/WO  contrast 02/22/2020 IMPRESSION:  1. Stable arterial findings since a 2017 CTA: No large vessel occlusion. Minimal atherosclerosis in the head and neck - although there is at least moderate stenosis suspected at the origin of the non-dominant left vertebral artery origin.  2. No CTA spot sign associated with the right thalamic hemorrhage.  3. 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 This appears not significantly changed since a 2016 brain MRI. - a 14 mm left thyroid lobe nodule meets consensus criteria for dedicated Thyroid Ultrasound follow-up.   MR BRAIN W WO CONTRAST 02/23/2020 IMPRESSION:  1. 2.1 x 2.1  x 2.2 cm acute intraparenchymal hemorrhage centered at the right thalamus, unchanged from previous. Associated mild surrounding vasogenic edema and localized regional mass effect, with up to 3 mm of localized right-to-left shift. No underlying mass lesion, abnormal enhancement, or other abnormality identified.  2. Multiple remote lacunar infarcts involving the left basal ganglia and thalamus.  3. Underlying age-related cerebral atrophy with advanced chronic microvascular ischemic disease.  4. Left mastoid and middle ear effusion.   CT HEAD CODE STROKE WO CONTRAST 02/22/2020 IMPRESSION:  1. Positive for acute right thalamic hemorrhage (6 mL) with mild surrounding edema and minimal mass effect. No intraventricular or extra-axial extension at this time.  2. Underlying advanced chronic small vessel disease.   Transthoracic Echocardiogram  02/23/2020 IMPRESSIONS  1. Left ventricular ejection fraction, by estimation, is 60 to 65%. The  left ventricle has normal function. The left ventricle has no regional wall motion abnormalities. There is moderate left ventricular hypertrophy of the basal-septal segment. Left  ventricular  diastolic parameters were normal.  2. Right ventricular systolic function is normal. The right ventricular size is normal. Tricuspid regurgitation signal is inadequate for assessing PA pressure.  3. The mitral valve is normal in structure. Trivial mitral valve regurgitation. No evidence of mitral stenosis.  4. The aortic valve was not well visualized. Aortic valve regurgitation is not visualized. Mild aortic valve sclerosis is present, with no evidence of aortic valve stenosis.  5. The inferior vena cava is dilated in size with >50% respiratory variability, suggesting right atrial pressure of 8 mmHg.   ECG - SR rate 64 BPM. (See cardiology reading for complete details)   PHYSICAL EXAM   Blood pressure (!) 117/94, pulse (!) 48, temperature 98.9 F (37.2 C), temperature source Oral, resp. rate 14, height 6\' 2"  (1.88 m), weight 115.5 kg, SpO2 95 %. Middle-aged African-American male not in distress. Afebrile. Head is nontraumatic. Neck is supple without bruit.    Cardiac exam no murmur or gallop. Lungs are clear to auscultation. Distal pulses are well felt. Neurological Exam : Patient is awake and interactive and follows commands well.  Mild dysarthria.  Extraocular movements are full range without nystagmus.  Blinks to threat bilaterally.  Left lower facial weakness.  Tongue midline.  Motor system exam shows no upper or lower extremity drift but has mild weakness of left grip intrinsic hand muscles.  Mild left finger-to-nose and knee to heel dysmetria.  Slight decrease sensation in the left face and arm.  Reflexes are symmetric.  Plantars are downgoing.  Gait not tested.   ASSESSMENT/PLAN [name] is a 70 y.o. male with history of hypertension, hyperlipidemia, vision abnormalities, hyperglycemia, Barrett's esophagus, multiple lacunar infarcts, prior small hypertensive bleeds in bilateral deep gray matter structures with most recent ICH in the right thalamus/posterior limb of internal  capsule in 2019 with mild residual left-sided weakness presenting with sudden onset of worsening dysarthria and left-sided weakness. He did not receive IV t-PA due to [place].  Stroke: acute right thalamic hemorrhage secondary to hx HTN (BP not high this admission)   Resultant dysarthria and left face and extremity weakness  Code Stroke CT Head - Positive for acute right thalamic hemorrhage (6 mL) with mild surrounding edema and minimal mass effect. No intraventricular or extra-axial extension at this time. Underlying advanced chronic small vessel disease.   CTA H&N - No large vessel occlusion. Minimal atherosclerosis in the head and neck - although there is at least moderate stenosis suspected at the origin of the non-dominant  left vertebral artery origin.  MRI head W & WO - 2.1 x 2.1 x 2.2 cm acute intraparenchymal hemorrhage centered at the right thalamus, unchanged from previous. Associated mild surrounding vasogenic edema and localized regional mass effect, with up to 3 mm of localized right-to-left shift. No underlying mass lesion, abnormal enhancement, or other abnormality identified. Multiple remote lacunar infarcts involving the left basal ganglia and thalamus.   2D Echo - EF 60 - 65%. No cardiac source of emboli identified.   [employer] Virus 2 - negative  LDL - 73  HgbA1c - 5.4   VTE prophylaxis - SCDs  aspirin 81 mg daily prior to admission, now on No antithrombotic  Therapy recommendations:  [name] - consult requested 4/11  Disposition:  Pending  Transfer to the floor, ok for non-tele bed   Hypertension  BP 113/80 on arrival - has not been hypertensive  Home BP meds: Norvasc; Benicar  Current BP meds: Labetalol  Systolic blood pressure somewhat low at times  . SBP goal < 140 mm Hg initially, can increase goal though BP remains lower . Long-term BP goal normotensive  Hyperlipidemia  Home Lipid lowering medication: none  LDL 73  Statin contraindicated with  ICH  Consider low dose statin at f/u given hx ischemic infarct, goal LDL < 70   Other Stroke Risk Factors  Advanced age  Former cigarette smoker  Substance Abuse hx - opiods  Obesity, Body mass index is 32.69 kg/m., recommend weight loss, diet and exercise as appropriate   Hx stroke/TIA  04/2018 - right thalamic/PLIC ICH likely secondary to HTN and smoking   09/2016 right facial tingling, MRI no acute infarct but old BG/CR infarct, CTA head and neck neg, EF 55-60%, LDL 86 and A1C 5.4. Considered recrudescence of old stroke, on ASA and lipitor  07/2015 - Left BG/CR infarct in the past - no residue  Family hx stroke (father and sisters)  Other Active Problems  Code status - Full code  14 mm left thyroid lobe nodule meets consensus criteria for dedicated Thyroid Ultrasound follow-up.  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   Bradycardia - resolved  Mild temp - 100.3; Resp rate - 9-21 (monitor) CXR prominence of B infrahilar bronchovascular markings - ? Minimal vasc congestion vs viral bronchopneumonia. UA neg. Encourage TCDB.   Hospital day # 3 Continue mobilize out of bed.  Ongoing therapies.  Transfer to inpatient rehab in the next few days when bed available.  No family available for discussion.  Greater than 50% time during this 35-minute visit was spent on counseling and coordination of care and discussion with care team. Antony Contras, MD  To contact Stroke Continuity provider, please refer to [email or web address] After hours, contact General Neurology

## 2020-02-25 NOTE — Evaluation (Signed)
Speech Language Pathology Evaluation Patient Details Name: Richard Vaughn MRN: [id number] DOB: [birth year] Today's Date: 02/25/2020 Time: [id number] SLP Time Calculation (min) (ACUTE ONLY): 19 min  Problem List:  Patient Active Problem List   Diagnosis Date Noted  . Treatment-emergent central sleep apnea 10/25/2018  . 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 Chronic pain syndrome   . Chronic bilateral low back pain   . Benign essential HTN   . Tobacco abuse   . Marijuana abuse   . Hyperlipidemia   . History of CVA with residual deficit   . Dysphagia, post-stroke   . ICH (intracerebral hemorrhage) (HCC) - R thalmic/PLIC d/t HTN [id number]  . Insomnia 07/13/2017  . PAD (peripheral artery disease) ([place]) 07/13/2017  . Hyperlipidemia with target LDL less than 70 07/13/2017  . Stroke-like symptom 09/22/2016  . Paresthesia 09/22/2016  . CVA (cerebral vascular accident) ([place]) 09/22/2016  . Cerebrovascular accident (CVA) due to thrombosis of left carotid artery ([place]) 09/23/2015  . Primary snoring 09/23/2015  . Hypersomnia with sleep apnea 09/23/2015  . Obstructive sleep apnea 09/23/2015  . Lacunar infarct, acute ([place]) 08/25/2015  . Sleep apnea 08/25/2015  . Dysarthria   . CVD (cardiovascular disease) 08/02/2015  . Acute hyperglycemia 08/02/2015  . Systolic hypertension with cerebrovascular disease 12/27/2014  . Epistaxis 07/28/2012  . HYPERGLYCEMIA 10/02/2010  . NEPHROLITHIASIS 05/23/2009  . BARRETTS ESOPHAGUS 02/20/2009  . Gastroparesis 02/20/2009  . RADICULOPATHY 10/21/2008  . GERD 10/08/2008  . ESOPHAGITIS 08/15/2008  . ESOPHAGEAL STRICTURE 08/15/2008  . GASTRITIS, ACUTE 08/15/2008  . RENAL CYST 08/14/2008  . TOBACCO ABUSE 08/08/2008  . HEMATURIA, MICROSCOPIC, HX OF 08/08/2008  . PULMONARY NODULE 01/10/2008   Past Medical History:  Past Medical History:  Diagnosis Date  . Barrett's esophagus   . Chronic back pain   . Gastritis    Mild  . Gastroparesis   . GERD (gastroesophageal reflux disease)   . Headache   . Hyperglycemia   . Hypertension   . Nephrolithiasis   . Renal cyst   . Stricture and stenosis of esophagus   . Stroke ([place]) 07-2015  . Vision abnormalities    Past Surgical History:  Past Surgical History:  Procedure Laterality Date  . [place] SURGERY  2012  . COLONOSCOPY    . [place] SURGERY  2005, 2010   replaced L4 and L5  . UPPER GASTROINTESTINAL ENDOSCOPY     HPI:  Richard Vaughn is a 69 y.o. male with PMHx HTN, GERD, previous stroke who presents to the ED via EMS with complaint of slurred speech and left sided weakness that began at approximately 7:45 PM.  Pt with hx of of both ischemic stroke and ICHs.  Head CT reported a right thalamic hemorrhage with mild surrounding edema and minimal mass effect.  Pt has been seen by acute and outpatient SLP in the past with most recent diet recommendations for Dysphagia 3 (soft) solids and thin liquids.     Assessment / Plan / Recommendation Clinical Impression   Patient presents with cognitive-linguistic functioning that is [facility]. Patient has been seen by acute and outpatient SLP previously (last seen in 2020) for dysarthria. Patient is currently presenting with dysarthria and reduced intelligibility at the word level. Per previous SLP notes and pt report, it appears his speech has worsened since this hospital stay. The pt also stated would like to continue receiving services to increase intelligibility. SLP will continue to follow.    SLP Assessment  SLP  Recommendation/Assessment: Patient needs continued Speech Lanaguage Pathology Services SLP Visit Diagnosis: Dysarthria and anarthria (R47.1)    Follow Up Recommendations  Inpatient Rehab    Frequency and Duration min 2x/week  2 weeks      SLP Evaluation Cognition  Overall Cognitive Status: Within Functional Limits for tasks assessed       Comprehension  Auditory Comprehension Overall  Auditory Comprehension: Appears within functional limits for tasks assessed Visual Recognition/Discrimination Discrimination: Within Function Limits    Expression Expression Primary Mode of Expression: Verbal Verbal Expression Overall Verbal Expression: Appears within functional limits for tasks assessed Written Expression Dominant Hand: Right   Oral / Motor  Oral Motor/Sensory Function Overall Oral Motor/Sensory Function: Mild impairment Facial ROM: Reduced left Facial Symmetry: Abnormal symmetry left Facial Sensation: Within Functional Limits Lingual ROM: Within Functional Limits Lingual Symmetry: Within Functional Limits Motor Speech Overall Motor Speech: Impaired at baseline Respiration: Within functional limits Phonation: Normal Resonance: Within functional limits Articulation: Impaired Level of Impairment: Word Intelligibility: Intelligibility reduced Word: 75-100% accurate Phrase: 75-100% accurate Sentence: 75-100% accurate Conversation: 75-100% accurate Motor Planning: Witnin functional limits   GO                   Aline August, Student SLP Office: [phone]  02/25/2020, 12:26 PM

## 2020-02-25 NOTE — Progress Notes (Signed)
  Speech Language Pathology Treatment: Dysphagia  Patient Details Name: Richard Vaughn MRN: [id number] DOB: [birth year] Today's Date: 02/25/2020 Time: [id number] SLP Time Calculation (min) (ACUTE ONLY): 19 min  Assessment / Plan / Recommendation Clinical Impression  Pt seen for dysphagia tx with lunch tray (regular solids and thin liquids). Following initial sip of thin liquids, pt noted with an immediate cough. However, the pt was observed with coughing prior to offering POs as well. No other s/sx of aspiration were observed. As seen in eval, pt noted with prolonged mastication, but was functional as the pt was able to clear oral cavity independently after each bite/sip. Recommend pt continue with a regular diet, thin liquids and meds whole with liquids. Pt does not need further f/u for dysphagia at this time. Will s/o.   HPI HPI: Richard Vaughn is a 69 y.o. male with PMHx HTN, GERD, previous stroke who presents to the ED via EMS with complaint of slurred speech and left sided weakness that began at approximately 7:45 PM.  Pt with hx of of both ischemic stroke and ICHs.  Head CT reported a right thalamic hemorrhage with mild surrounding edema and minimal mass effect.  Pt has been seen by acute and outpatient SLP in the past with most recent diet recommendations for Dysphagia 3 (soft) solids and thin liquids.        SLP Plan  All goals met  Patient needs continued Speech Lanaguage Pathology Services    Recommendations  Diet recommendations: Regular;Thin liquid Liquids provided via: Straw;Cup Medication Administration: Whole meds with liquid Supervision: Patient able to self feed Compensations: Slow rate;Small sips/bites Postural Changes and/or Swallow Maneuvers: Seated upright 90 degrees                Oral Care Recommendations: Oral care BID Follow up Recommendations: Inpatient Rehab SLP Visit Diagnosis: Dysphagia, unspecified (R13.10) Plan: All goals met       [place], Student SLP Office: [phone]  02/25/2020, 12:37 PM

## 2020-02-25 NOTE — Consult Note (Signed)
Physical Medicine and Rehabilitation Consult Reason for Consult: Left side weakness and slurred speech Referring Physician: Dr. Leonie Man   HPI: Richard Vaughn is a 69 y.o. right-handed male with history of hypertension, chronic back pain maintained on Opana 10 mg twice daily, quit smoking 22 months ago, hyperlipidemia, Barrett's esophagus, multiple lacunar infarcts, prior small hypertensive bleeds and bilateral deep gray-white matter structures with most recent [place] in the right thalamus/posterior limb of internal capsule 2019 with mild residual left-sided weakness.  Per chart review patient lives with spouse.  1 level home 3 steps to entry.  Reportedly independent prior to admission.  Presented 02/22/2020 with increasing left-sided weakness and slurred speech.  Cranial CT scan positive for acute right thalamic hemorrhage with mild surrounding edema with minimal mass-effect.  CT angiogram head and neck showed no large vessel occlusion there was an incidental finding of a small chronic 14 mm left thyroid nodule.  Patient did not receive TPA.  I 2.1 x 2.1 x 2.2 cm acute intraparenchymal hemorrhage centered at the right thalamus.  Multiple remote lacunar infarcts involving the left basal ganglia and thalamus.  Echocardiogram with ejection fraction of 65%.  Neurology services consulted maintained on Cleviprex for blood pressure control.  Tolerating a regular diet.  Therapy evaluations completed with recommendations of physical medicine rehab consult.   Review of Systems  Constitutional: Negative for chills and fever.  HENT: Negative for hearing loss.   Eyes: Negative for blurred vision and double vision.  Respiratory: Negative for cough and shortness of breath.   Cardiovascular: Negative for chest pain, palpitations and leg swelling.  Gastrointestinal: Positive for constipation. Negative for heartburn, nausea and vomiting.  Genitourinary: Negative for dysuria and hematuria.  Musculoskeletal: Positive  for back pain and myalgias.  Skin: Negative for rash.  Neurological: Positive for sensory change, speech change, weakness and headaches.  All other systems reviewed and are negative.  Past Medical History:  Diagnosis Date  . Barrett's esophagus   . Chronic back pain   . Gastritis    Mild  . Gastroparesis   . GERD (gastroesophageal reflux disease)   . Headache   . Hyperglycemia   . Hypertension   . Nephrolithiasis   . Renal cyst   . Stricture and stenosis of esophagus   . Stroke ([place]) 07-2015  . Vision abnormalities    Past Surgical History:  Procedure Laterality Date  . [place] SURGERY  2012  . COLONOSCOPY    . [place] SURGERY  2005, 2010   replaced L4 and L5  . UPPER GASTROINTESTINAL ENDOSCOPY     Family History  Problem Relation Age of Onset  . Hypertension Father   . Stroke Father   . Hypertension Mother   . Liver cancer Mother   . Hypertension Sister        2 other sisters has also  . Stroke Sister   . Stroke Sister   . Hypertension Brother        2nd brother has also  . Colon cancer Neg Hx   . Esophageal cancer Neg Hx   . Rectal cancer Neg Hx   . Stomach cancer Neg Hx   . Colon polyps Neg Hx    Social History:  reports that he quit smoking about 22 months ago. His smoking use included cigarettes. He has a 15.00 pack-year smoking history. He has never used smokeless tobacco. He reports current drug use. Drugs: Oxycodone and Morphine. He reports that he does not drink  alcohol. Allergies: No Known Allergies Facility-Administered Medications Prior to Admission  Medication Dose Route Frequency Provider Last Rate Last Admin  . 0.9 %  sodium chloride infusion  500 mL Intravenous Continuous Armbruster, Carlota Raspberry, MD       Medications Prior to Admission  Medication Sig Dispense Refill  . amLODipine (NORVASC) 2.5 MG tablet Take 1 tablet (2.5 mg total) by mouth daily. 90 tablet 2  . aspirin 81 MG tablet Take 1 tablet (81 mg total) by mouth daily. 30 tablet    . esomeprazole (NEXIUM) 40 MG capsule Take 1 capsule (40 mg total) by mouth 2 (two) times daily before a meal. Please schedule a yearly follow up for any further refills.  Last seen 01-2019. (Patient taking differently: Take 40 mg by mouth 2 (two) times daily before a meal. ) 60 capsule 0  . naproxen (NAPROSYN) 500 MG tablet Take 500 mg by mouth 2 (two) times daily.    Marland Kitchen olmesartan (BENICAR) 20 MG tablet Take 1 tablet (20 mg total) by mouth daily. 90 tablet 2  . oxymorphone (OPANA) 10 MG tablet Take 10 mg by mouth in the morning and at bedtime.     . traZODone (DESYREL) 50 MG tablet TAKE 1 TABLET AT BEDTIME   NEEDS FOLLOW-UP APPOINTMENTBEFORE MORE REFILLS ARE    AUTHORIZED (Patient taking differently: Take 50 mg by mouth at bedtime. ) 90 tablet 2    Home: Home Living Family/patient expects to be discharged to:: Private residence Living Arrangements: Spouse/significant other Available Help at Discharge: Family, Available 24 hours/day Type of Home: House Home Access: Stairs to enter Technical brewer of Steps: 3 Entrance Stairs-Rails: Right Home Layout: One level Bathroom Shower/Tub: Multimedia programmer: [place]: Environmental consultant - 2 wheels, [place] - single point, Bedside commode  Functional History: Prior Function Level of Independence: Independent Comments: pt reports he walks as his hobby Functional Status:  Mobility: Bed Mobility Overal bed mobility: Needs Assistance Bed Mobility: Supine to Sit Supine to sit: Supervision Transfers Overall transfer level: Needs assistance Equipment used: Rolling walker (2 wheeled) Transfers: Sit to/from Stand Sit to Stand: Mod assist, From elevated surface General transfer comment: modA for power up today; L side weak Ambulation/Gait Ambulation/Gait assistance: Mod assist Gait Distance (Feet): 15 Feet Assistive device: Rolling walker (2 wheeled) Gait Pattern/deviations: Step-to pattern, Decreased step length - left,  Decreased dorsiflexion - left, Staggering left General Gait Details: pt with foot drag of LLE, often leaving left foot behind and with impaired ability to clear LLE. Pt experiences one significant LOB requiring assistance to control descent onto bed when L foot was crossed behind R foot during gait. Pt also requires cueing for gait sequencing and RW management during session Gait velocity: reduced Gait velocity interpretation: <1.8 ft/sec, indicate of risk for recurrent falls    ADL: ADL Overall ADL's : Needs assistance/impaired Eating/Feeding: Set up, Sitting Grooming: Min guard, Standing Upper Body Bathing: Min guard, Standing Lower Body Bathing: Moderate assistance, Sitting/lateral leans, Sit to/from stand, Cueing for safety, Cueing for sequencing Upper Body Dressing : Min guard, Sitting, Standing Lower Body Dressing: Moderate assistance, Cueing for safety, Cueing for sequencing, Sitting/lateral leans, Sit to/from stand Lower Body Dressing Details (indicate cue type and reason): donning shoes, but unable to tie them Toilet Transfer: Moderate assistance, Ambulation, BSC, RW Toileting- Clothing Manipulation and Hygiene: Minimal assistance, Cueing for safety, Sit to/from stand, Sitting/lateral lean Functional mobility during ADLs: Moderate assistance, Cueing for safety, Cueing for sequencing, Rolling walker General ADL Comments: Pt  with decreased insight into deficits, decreased safety awareness and decreased ability to care for self safely.  Cognition: Cognition Overall Cognitive Status: Impaired/Different from baseline Orientation Level: Oriented X4 Cognition Arousal/Alertness: Awake/alert Behavior During Therapy: WFL for tasks assessed/performed, Flat affect Overall Cognitive Status: Impaired/Different from baseline Area of Impairment: Safety/judgement, Problem solving, Awareness Orientation Level: Disoriented to, Time Following Commands: Follows one step commands consistently,  Follows multi-step commands with increased time Safety/Judgement: Decreased awareness of safety, Decreased awareness of deficits Awareness: Emergent Problem Solving: Slow processing, Requires verbal cues, Requires tactile cues General Comments: Pt A/O x4 today. Pt requiring cues for processing and unaware of worsening deficits that family comfirmed.  Blood pressure 120/76, pulse (!) 48, temperature 98 F (36.7 C), temperature source Oral, resp. rate 11, height 6\' 2"  (1.88 m), weight 115.5 kg, SpO2 95 %. Physical Exam  Neurological:  Patient is alert in no acute distress.  Makes good eye contact with examiner and follows commands.  Oriented to person and place.  Appears to have fair awareness of deficits. Left central VII, left facial/LUE sensory loss. Left hemiparesis    Results for orders placed or performed during the hospital encounter of 02/22/20 (from the past 24 hour(s))  Basic metabolic panel     Status: Abnormal   Collection Time: 02/24/20  8:24 AM  Result Value Ref Range   Sodium 140 135 - 145 mmol/L   Potassium 3.8 3.5 - 5.1 mmol/L   Chloride 108 98 - 111 mmol/L   CO2 23 22 - 32 mmol/L   Glucose, Bld 101 (H) 70 - 99 mg/dL   BUN 11 8 - 23 mg/dL   Creatinine, Ser 0.92 0.61 - 1.24 mg/dL   Calcium 9.2 8.9 - 10.3 mg/dL   GFR calc non Af Amer >60 >60 mL/min   GFR calc Af Amer >60 >60 mL/min   Anion gap 9 5 - 15  CBC     Status: None   Collection Time: 02/24/20  8:24 AM  Result Value Ref Range   WBC 7.3 4.0 - 10.5 K/uL   RBC 4.82 4.22 - 5.81 MIL/uL   Hemoglobin 15.0 13.0 - 17.0 g/dL   HCT 43.9 39.0 - 52.0 %   MCV 91.1 80.0 - 100.0 fL   MCH 31.1 26.0 - 34.0 pg   MCHC 34.2 30.0 - 36.0 g/dL   RDW 12.2 11.5 - 15.5 %   Platelets 179 150 - 400 K/uL   nRBC 0.0 0.0 - 0.2 %  Urinalysis, Complete w Microscopic     Status: None   Collection Time: 02/24/20  6:30 PM  Result Value Ref Range   Color, Urine YELLOW YELLOW   APPearance CLEAR CLEAR   Specific Gravity, Urine 1.023 1.005  - 1.030   pH 5.0 5.0 - 8.0   Glucose, UA NEGATIVE NEGATIVE mg/dL   Hgb urine dipstick NEGATIVE NEGATIVE   Bilirubin Urine NEGATIVE NEGATIVE   Ketones, ur NEGATIVE NEGATIVE mg/dL   Protein, ur NEGATIVE NEGATIVE mg/dL   Nitrite NEGATIVE NEGATIVE   Leukocytes,Ua NEGATIVE NEGATIVE   RBC / HPF 0-5 0 - 5 RBC/hpf   WBC, UA 0-5 0 - 5 WBC/hpf   Bacteria, UA NONE SEEN NONE SEEN   Squamous Epithelial / LPF 0-5 0 - 5   Mucus PRESENT    DG Chest Port 1 View  Result Date: 02/24/2020 CLINICAL DATA:  Fever. EXAM: PORTABLE CHEST 1 VIEW COMPARISON:  08/02/2015 FINDINGS: Lungs are adequately inflated with subtle prominence of the bronchovascular markings over the  infrahilar regions. No focal lobar consolidation or effusion. Cardiomediastinal silhouette and remainder of the exam is unchanged. IMPRESSION: Slight prominence of the bilateral infrahilar bronchovascular markings which may be due to minimal vascular congestion versus viral bronchopneumonia. Electronically Signed   By: Marin Olp M.D.   On: 02/24/2020 11:25   ECHOCARDIOGRAM COMPLETE  Result Date: 02/23/2020    ECHOCARDIOGRAM REPORT   Patient Name:   CARDEN BYCE Date of Exam: 02/23/2020 Medical Rec #:  [id number]      Height:       74.0 in Accession #:    [id number]     Weight:       254.6 lb Date of Birth:  February 11, 1951     BSA:          2.408 m Patient Age:    44 years       BP:           116/85 mmHg Patient Gender: M              HR:           46 bpm. Exam Location:  Inpatient Procedure: 2D Echo, 3D Echo, Cardiac Doppler and Color Doppler Indications:    Stroke  History:        Patient has prior history of Echocardiogram examinations, most                 recent 04/16/2018. Abnormal ECG, Stroke; Risk Factors:Current                 Smoker, Dyslipidemia, Hypertension and Sleep Apnea. Marijuana                 use. ICH.  Sonographer:    Roseanna Rainbow RDCS Referring Phys: [id number] ASHISH ARORA  Sonographer Comments: Suboptimal apical window. Image acquisition  challenging due to respiratory motion. Patient could not follow breathing instructions. IMPRESSIONS  1. Left ventricular ejection fraction, by estimation, is 60 to 65%. The left ventricle has normal function. The left ventricle has no regional wall motion abnormalities. There is moderate left ventricular hypertrophy of the basal-septal segment. Left ventricular diastolic parameters were normal.  2. Right ventricular systolic function is normal. The right ventricular size is normal. Tricuspid regurgitation signal is inadequate for assessing PA pressure.  3. The mitral valve is normal in structure. Trivial mitral valve regurgitation. No evidence of mitral stenosis.  4. The aortic valve was not well visualized. Aortic valve regurgitation is not visualized. Mild aortic valve sclerosis is present, with no evidence of aortic valve stenosis.  5. The inferior vena cava is dilated in size with >50% respiratory variability, suggesting right atrial pressure of 8 mmHg. FINDINGS  Left Ventricle: Left ventricular ejection fraction, by estimation, is 60 to 65%. The left ventricle has normal function. The left ventricle has no regional wall motion abnormalities. The left ventricular internal cavity size was normal in size. There is  moderate left ventricular hypertrophy of the basal-septal segment. Left ventricular diastolic parameters were normal. Normal left ventricular filling pressure. Right Ventricle: The right ventricular size is normal. No increase in right ventricular wall thickness. Right ventricular systolic function is normal. Tricuspid regurgitation signal is inadequate for assessing PA pressure. Left Atrium: Left atrial size was normal in size. Right Atrium: Right atrial size was normal in size. Pericardium: There is no evidence of pericardial effusion. Mitral Valve: The mitral valve is normal in structure. Normal mobility of the mitral valve leaflets. Trivial mitral valve regurgitation. No evidence of mitral valve  stenosis. Tricuspid Valve: The tricuspid valve is normal in structure. Tricuspid valve regurgitation is not demonstrated. No evidence of tricuspid stenosis. Aortic Valve: The aortic valve was not well visualized. Aortic valve regurgitation is not visualized. Mild aortic valve sclerosis is present, with no evidence of aortic valve stenosis. Pulmonic Valve: The pulmonic valve was normal in structure. Pulmonic valve regurgitation is not visualized. No evidence of pulmonic stenosis. Aorta: The aortic root is normal in size and structure. Venous: The inferior vena cava is dilated in size with greater than 50% respiratory variability, suggesting right atrial pressure of 8 mmHg. IAS/Shunts: No atrial level shunt detected by color flow Doppler.  LEFT VENTRICLE PLAX 2D LVIDd:         4.80 cm      Diastology LVIDs:         3.10 cm      LV e' lateral:   11.60 cm/s LV PW:         1.30 cm      LV E/e' lateral: 6.3 LV IVS:        1.50 cm      LV e' medial:    7.07 cm/s                             LV E/e' medial:  10.4  LV Volumes (MOD) LV vol d, MOD A2C: 165.0 ml LV vol d, MOD A4C: 141.0 ml LV vol s, MOD A2C: 52.6 ml LV vol s, MOD A4C: 48.6 ml LV SV MOD A2C:     112.4 ml LV SV MOD A4C:     141.0 ml LV SV MOD BP:      105.9 ml RIGHT VENTRICLE             IVC RV S prime:     12.60 cm/s  IVC diam: 2.30 cm TAPSE (M-mode): 2.7 cm LEFT ATRIUM           Index       RIGHT ATRIUM           Index LA diam:      3.10 cm 1.29 cm/m  RA Area:     23.80 cm LA Vol (A2C): 36.4 ml 15.11 ml/m RA Volume:   68.40 ml  28.40 ml/m LA Vol (A4C): 38.7 ml 16.07 ml/m  AORTIC VALVE LVOT Vmax:   103.00 cm/s LVOT Vmean:  71.500 cm/s LVOT VTI:    0.246 m  AORTA Ao Root diam: 3.60 cm Ao Asc diam:  4.10 cm MITRAL VALVE MV Area (PHT): 3.60 cm    SHUNTS MV Decel Time: 211 msec    Systemic VTI: 0.25 m MV E velocity: 73.30 cm/s MV A velocity: 54.80 cm/s MV E/A ratio:  1.34 Fransico Him MD Electronically signed by Fransico Him MD Signature Date/Time:  02/23/2020/12:38:04 PM    Final      Assessment/Plan: Diagnosis: 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 Does the need for close, 24 hr/day medical supervision in concert with the patient's rehab needs make it unreasonable for this patient to be served in a less intensive setting? Yes 2. Co-Morbidities requiring supervision/potential complications: HTN, prior CVA's, chronic low back pain 3. Due to bladder management, bowel management, safety, skin/wound care, disease management, medication administration, pain management and patient education, does the patient require 24 hr/day rehab nursing? Yes 4. Does the patient require coordinated care of a physician, rehab nurse, therapy disciplines of PT, OT,  SLP to address physical and functional deficits in the context of the above medical diagnosis(es)? Yes Addressing deficits in the following areas: balance, endurance, locomotion, strength, transferring, bowel/bladder control, dressing, grooming, speech and psychosocial support 5. Can the patient actively participate in an intensive therapy program of at least 3 hrs of therapy per day at least 5 days per week? Yes 6. The potential for patient to make measurable gains while on inpatient rehab is excellent 7. Anticipated functional outcomes upon discharge from inpatient rehab are modified independent and supervision  with PT, modified independent and supervision with OT, modified independent with SLP. 8. Estimated rehab length of stay to reach the above functional goals is: 9-12 days 9. Anticipated discharge destination: Home 10. Overall Rehab/Functional Prognosis: excellent  RECOMMENDATIONS: This patient's condition is appropriate for continued rehabilitative care in the following setting: [name]  Note that insurance prior authorization may be required for reimbursement for recommended care.  Comment: Rehab Admissions Coordinator to follow up.  Thanks,  Meredith Staggers, MD, Mellody Drown   I have reviewed and concur with the physician assistant's documentation above.    Lavon Paganini Angiulli, PA-C 02/25/2020

## 2020-02-25 NOTE — Progress Notes (Signed)
Physical Therapy Treatment Patient Details Name: Richard Vaughn MRN: [id number] DOB: May 21, 1951 Today's Date: 02/25/2020    History of Present Illness 69 y.o. male past medical history of hypertension, hyperlipidemia, Barrett's esophagus, multiple lacunar infarcts, prior small hypertensive bleeds in bilateral deep gray matter structures with most recent [place] in the right thalamus/posterior limb of internal capsule in 2019 with mild residual left-sided weakness presenting to the emergency room for sudden onset of worsening dysarthria and left-sided weakness, vision changes. Pt found to have R thalamic hemorrhage.    PT Comments    Patient progressing well towards PT goals. Continues to require Mod A for standing from all surfaces working on proper form to allow for equal WB through BUEs/LEs. Tolerated gait training with Min A for gait sequencing and balance. Used rail in hallway progressing to [facility] for support. Pt tearful during session. Would be a great rehab candidate. Will follow.    Follow Up Recommendations  [name];Supervision/Assistance - 24 hour     Equipment Recommendations  Other (comment)(defer to next venue)    Recommendations for Other Services       Precautions / Restrictions Precautions Precautions: Fall;Other (comment) Precaution Comments: SBP<160 Restrictions Weight Bearing Restrictions: No    Mobility  Bed Mobility Overal bed mobility: Needs Assistance Bed Mobility: Supine to Sit     Supine to sit: HOB elevated;Min assist     General bed mobility comments: Min A needed to get LLE to EOB (stuck btw sheet and end plate).  Transfers Overall transfer level: Needs assistance Equipment used: None Transfers: Sit to/from [facility] Sit to Stand: Mod assist Stand pivot transfers: Mod assist       General transfer comment: Assist to power to standing wtih cues to push through BUEs/LEs for equal weight distribution for all transitions. Stood from  [employer], from chair x2.  Ambulation/Gait Ambulation/Gait assistance: Min assist Gait Distance (Feet): 60 Feet(+75') Assistive device: 1 person hand held assist(vs rail) Gait Pattern/deviations: Decreased step length - left;Decreased dorsiflexion - left;Step-through pattern;Narrow base of support Gait velocity: reduced Gait velocity interpretation: <1.8 ft/sec, indicate of risk for recurrent falls General Gait Details: Slow, unsteady gait leaving with difficulty clearing LLE especially when fatigued; used rail in hallway for support progressing to [facility]. 1 seated rest break.   Stairs             Wheelchair Mobility    Modified Rankin (Stroke Patients Only) Modified Rankin (Stroke Patients Only) Pre-Morbid Rankin Score: No significant disability Modified Rankin: Moderately severe disability     Balance Overall balance assessment: Needs assistance Sitting-balance support: Feet supported;No upper extremity supported Sitting balance-Leahy Scale: Fair Sitting balance - Comments: minG at edge of bed   Standing balance support: During functional activity Standing balance-Leahy Scale: Poor Standing balance comment: Min guard-Min A for static balance.                            Cognition Arousal/Alertness: Awake/alert Behavior During Therapy: WFL for tasks assessed/performed Overall Cognitive Status: Within Functional Limits for tasks assessed                                 General Comments: Pt tearful during session.      Exercises      General Comments General comments (skin integrity, edema, etc.): VSS on RA.      Pertinent Vitals/Pain Pain Assessment: Faces Faces  Pain Scale: Hurts a little bit Pain Location: head Pain Descriptors / Indicators: Headache Pain Intervention(s): RN gave pain meds during session;Monitored during session    Home Living     Available Help at Discharge: Family;Available 24 hours/day Type of Home: House               Prior Function            PT Goals (current goals can now be found in the care plan section) Progress towards PT goals: Progressing toward goals    Frequency    Min 4X/week      PT Plan Current plan remains appropriate    Co-evaluation              AM-PAC PT "6 Clicks" Mobility   Outcome Measure  Help needed turning from your back to your side while in a flat bed without using bedrails?: None Help needed moving from lying on your back to sitting on the side of a flat bed without using bedrails?: A Little Help needed moving to and from a bed to a chair (including a wheelchair)?: A Lot Help needed standing up from a chair using your arms (e.g., wheelchair or bedside chair)?: A Lot Help needed to walk in hospital room?: A Little Help needed climbing 3-5 steps with a railing? : Total 6 Click Score: 15    End of Session Equipment Utilized During Treatment: Gait belt Activity Tolerance: Patient tolerated treatment well Patient left: in chair;with call bell/phone within reach;with chair alarm set Nurse Communication: Mobility status PT Visit Diagnosis: Unsteadiness on feet (R26.81);Other abnormalities of gait and mobility (R26.89);Muscle weakness (generalized) (M62.81);Other symptoms and signs involving the nervous system (R29.898)     Time: [id number] PT Time Calculation (min) (ACUTE ONLY): 20 min  Charges:  $Gait Training: 8-22 mins                     Marisa Severin, PT, DPT Acute Rehabilitation Services Pager [phone] Office [phone]       [place] 02/25/2020, 1:13 PM

## 2020-02-26 NOTE — Progress Notes (Signed)
STROKE TEAM PROGRESS NOTE   INTERVAL HISTORY Patient is stable.  Sitting up in a bedside chair.  He was able to ambulate with physical therapy but is ataxic and off balance.  He is awaiting transfer to rehab but no bed is available even to be transferred out of the ICU to the floor today.  Vital signs are stable.  No new complaints..  OBJECTIVE Vitals:   02/26/20 0500 02/26/20 0600 02/26/20 0700 02/26/20 0800  BP: 116/83 111/81 (!) 109/93 110/78  Pulse: 64 62 (!) 58 67  Resp: 15 10 15 15   Temp:    99 F (37.2 C)  TempSrc:    Oral  SpO2: 93% 95% 98% 92%  Weight:      Height:       CBC:  Recent Labs  Lab 02/22/20 2100 02/22/20 2100 02/22/20 2113 02/24/20 0824  WBC 6.6  --   --  7.3  NEUTROABS 3.7  --   --   --   HGB 14.0   < > 13.6 15.0  HCT 42.3   < > 40.0 43.9  MCV 95.1  --   --  91.1  PLT 148*  --   --  179   < > = values in this interval not displayed.   Basic Metabolic Panel:  Recent Labs  Lab 02/22/20 2100 02/22/20 2100 02/22/20 2113 02/24/20 0824  NA 139   < > 140 140  K 4.1   < > 3.8 3.8  CL 109   < > 106 108  CO2 22  --   --  23  GLUCOSE 98   < > 95 101*  BUN 14   < > 15 11  CREATININE 1.05   < > 1.00 0.92  CALCIUM 9.0  --   --  9.2   < > = values in this interval not displayed.   Lipid Panel:     Component Value Date/Time   CHOL 125 02/22/2020 2100   CHOL 142 05/15/2018 1329   TRIG 86 02/22/2020 2100   TRIG 121 11/01/2006 1056   HDL 35 (L) 02/22/2020 2100   HDL 30 (L) 05/15/2018 1329   CHOLHDL 3.6 02/22/2020 2100   VLDL 17 02/22/2020 2100   LDLCALC 73 02/22/2020 2100   LDLCALC 87 05/15/2018 1329   HgbA1c:  Lab Results  Component Value Date   HGBA1C 5.4 02/22/2020   Urine Drug Screen:     Component Value Date/Time   LABOPIA NONE DETECTED 04/15/2018 1630   COCAINSCRNUR NONE DETECTED 04/15/2018 1630   LABBENZ NONE DETECTED 04/15/2018 1630   AMPHETMU NONE DETECTED 04/15/2018 1630   THCU POSITIVE (A) 04/15/2018 1630   LABBARB NONE  DETECTED 04/15/2018 1630    Alcohol Level     Component Value Date/Time   ETH <10 04/15/2018 0127    IMAGING  CT Code Stroke CTA Head W/WO contrast CT Code Stroke CTA Neck W/WO contrast 02/22/2020 IMPRESSION:  1. Stable arterial findings since a 2017 CTA: No large vessel occlusion. Minimal atherosclerosis in the head and neck - although there is at least moderate stenosis suspected at the origin of the non-dominant left vertebral artery origin.  2. No CTA spot sign associated with the right thalamic hemorrhage.  3. 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 This appears not  significantly changed since a 2016 brain MRI. - a 14 mm left thyroid lobe nodule meets consensus criteria for dedicated Thyroid Ultrasound follow-up.   MR BRAIN W WO CONTRAST 02/23/2020 IMPRESSION:  1. 2.1 x 2.1 x 2.2 cm acute intraparenchymal hemorrhage centered at the right thalamus, unchanged from previous. Associated mild surrounding vasogenic edema and localized regional mass effect, with up to 3 mm of localized right-to-left shift. No underlying mass lesion, abnormal enhancement, or other abnormality identified.  2. Multiple remote lacunar infarcts involving the left basal ganglia and thalamus.  3. Underlying age-related cerebral atrophy with advanced chronic microvascular ischemic disease.  4. Left mastoid and middle ear effusion.   CT HEAD CODE STROKE WO CONTRAST 02/22/2020 IMPRESSION:  1. Positive for acute right thalamic hemorrhage (6 mL) with mild surrounding edema and minimal mass effect. No intraventricular or extra-axial extension at this time.  2. Underlying advanced chronic small vessel disease.   Transthoracic Echocardiogram  02/23/2020 IMPRESSIONS  1. Left ventricular ejection fraction, by estimation, is 60 to 65%. The  left  ventricle has normal function. The left ventricle has no regional wall motion abnormalities. There is moderate left ventricular hypertrophy of the basal-septal segment. Left  ventricular diastolic parameters were normal.  2. Right ventricular systolic function is normal. The right ventricular size is normal. Tricuspid regurgitation signal is inadequate for assessing PA pressure.  3. The mitral valve is normal in structure. Trivial mitral valve regurgitation. No evidence of mitral stenosis.  4. The aortic valve was not well visualized. Aortic valve regurgitation is not visualized. Mild aortic valve sclerosis is present, with no evidence of aortic valve stenosis.  5. The inferior vena cava is dilated in size with >50% respiratory variability, suggesting right atrial pressure of 8 mmHg.   ECG - SR rate 64 BPM. (See cardiology reading for complete details)   PHYSICAL EXAM    Blood pressure 110/78, pulse 67, temperature 99 F (37.2 C), temperature source Oral, resp. rate 15, height 6\' 2"  (1.88 m), weight 115.5 kg, SpO2 92 %. Pleasant middle-aged African-American male not in distress. Afebrile. Head is nontraumatic. Neck is supple without bruit.    Cardiac exam no murmur or gallop. Lungs are clear to auscultation. Distal pulses are well felt. Neurological Exam : Patient is awake and interactive and follows commands well.  Mild dysarthria.  Extraocular movements are full range without nystagmus.  Blinks to threat bilaterally.  Left lower facial weakness.  Tongue midline.  Motor system exam shows no upper or lower extremity drift but has mild weakness of left grip intrinsic hand muscles.  Mild left finger-to-nose and knee to heel dysmetria.  Slight decrease sensation in the left face and arm.  Reflexes are symmetric.  Plantars are downgoing.  Gait not tested.   ASSESSMENT/PLAN [name] is a 69 y.o. male with history of hypertension, hyperlipidemia, vision abnormalities, hyperglycemia,  Barrett's esophagus, multiple lacunar infarcts, prior small hypertensive bleeds in bilateral deep gray matter structures with most recent ICH in the right thalamus/posterior limb of internal capsule in 2019 with mild residual left-sided weakness presenting with sudden onset of worsening dysarthria and left-sided weakness. He did not receive IV t-PA due to [place].   Stroke: acute right thalamic hemorrhage secondary to hx HTN   Resultant dysarthria and left face and extremity weakness  Code Stroke CT Head - Positive for acute right thalamic hemorrhage (6 mL) with mild surrounding edema and minimal mass effect. No intraventricular or extra-axial extension at this time. Underlying advanced chronic small  vessel disease.   CTA H&N - No large vessel occlusion. Minimal atherosclerosis in the head and neck - although there is at least moderate stenosis suspected at the origin of the non-dominant left vertebral artery origin.  MRI head W & WO - 2.1 x 2.1 x 2.2 cm acute intraparenchymal hemorrhage centered at the right thalamus, unchanged from previous. Associated mild surrounding vasogenic edema and localized regional mass effect, with up to 3 mm of localized right-to-left shift. No underlying mass lesion, abnormal enhancement, or other abnormality identified. Multiple remote lacunar infarcts involving the left basal ganglia and thalamus.   2D Echo - EF 60 - 65%. No cardiac source of emboli identified.   [employer] Virus 2 - negative  LDL - 73  HgbA1c - 5.4   VTE prophylaxis - SCDs  aspirin 81 mg daily prior to admission, now on No antithrombotic  Therapy recommendations:  [name]  Disposition:  Pending  Transfer to the floor, ok for non-tele bed   Medically ready for d/c once plan identified  Hypertension  BP 113/80 on arrival - has not been hypertensive  Home BP meds: Norvasc; Benicar  Current BP meds: Labetalol  Systolic blood pressure somewhat low at times  . SBP goal < 140 mm Hg  initially, can increase goal though BP remains low . Long-term BP goal normotensive  Hyperlipidemia  Home Lipid lowering medication: none  LDL 73  Statin contraindicated with ICH  Consider low dose statin at f/u given hx ischemic infarct, goal LDL < 70   Other Stroke Risk Factors  Advanced age  Former cigarette smoker  Substance Abuse hx - opiods  Obesity, Body mass index is 32.69 kg/m., recommend weight loss, diet and exercise as appropriate   Hx stroke/TIA  04/2018 - right thalamic/PLIC ICH likely secondary to HTN and smoking   09/2016 right facial tingling, MRI no acute infarct but old BG/CR infarct, CTA head and neck neg, EF 55-60%, LDL 86 and A1C 5.4. Considered recrudescence of old stroke, on ASA and lipitor  07/2015 - Left BG/CR infarct in the past - no residue  Family hx stroke (father and sisters)  Other Active Problems  Code status - Full code  14 mm left thyroid lobe nodule meets consensus criteria for dedicated Thyroid Ultrasound follow-up.  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   Bradycardia - resolved  Mild temp - 99; Resp rate - 15 (monitor) CXR prominence of B infrahilar bronchovascular markings - ? Minimal vasc congestion vs viral bronchopneumonia. UA neg. Encourage TCDB.   Hospital day # 4 Continue mobilize out of bed.  Ongoing therapies.  Transfer to neurology floor bed when available.  Consult medical hospitalist team to take over as primary attending.  Discussed with Dr. Karie Kirks.  Hopefully transfer to rehab after insurance approval and bed availability of the next few days.  Greater than 50% time during this 25-minute visit was spent in counseling and coordination of care and discussion with care team  Antony Contras, MD  To contact Stroke Continuity provider, please refer to [email or web address] After hours, contact General Neurology

## 2020-02-26 NOTE — Social Work (Signed)
CSW met with pt at bedside. CSW introduced self and explained her role. CSW completed sbirt with pt. Pt scored a 0 on the sbirt scale. Pt denied alcohol use. Pt stated that he does use marijuana occasionally. Pt stated that he does not have a problem with his marijuana use.   CSW offered resources and the pt denied them.   Emeterio Reeve, Latanya Presser, [place] Social Worker [phone]

## 2020-02-27 ENCOUNTER — Encounter (HOSPITAL_COMMUNITY): Payer: Self-pay | Admitting: Physical Medicine and Rehabilitation

## 2020-02-27 ENCOUNTER — Inpatient Hospital Stay (HOSPITAL_COMMUNITY)
Admission: RE | Admit: 2020-02-27 | Discharge: 2020-03-07 | DRG: 057 | Disposition: A | Discharge status: Home / Self Care | Payer: Medicare Other | Source: Intra-hospital | Attending: Physical Medicine and Rehabilitation | Admitting: Physical Medicine and Rehabilitation

## 2020-02-27 ENCOUNTER — Encounter (HOSPITAL_COMMUNITY): Payer: Self-pay | Admitting: Student in an Organized Health Care Education/Training Program

## 2020-02-27 ENCOUNTER — Other Ambulatory Visit: Payer: Self-pay

## 2020-02-27 DIAGNOSIS — K219 Gastro-esophageal reflux disease without esophagitis: Secondary | ICD-10-CM | POA: Diagnosis present

## 2020-02-27 DIAGNOSIS — E041 Nontoxic single thyroid nodule: Secondary | ICD-10-CM

## 2020-02-27 DIAGNOSIS — R001 Bradycardia, unspecified: Secondary | ICD-10-CM | POA: Diagnosis not present

## 2020-02-27 DIAGNOSIS — K2270 Barrett's esophagus without dysplasia: Secondary | ICD-10-CM | POA: Diagnosis present

## 2020-02-27 DIAGNOSIS — F329 Major depressive disorder, single episode, unspecified: Secondary | ICD-10-CM | POA: Diagnosis not present

## 2020-02-27 DIAGNOSIS — I619 Nontraumatic intracerebral hemorrhage, unspecified: Secondary | ICD-10-CM | POA: Diagnosis present

## 2020-02-27 DIAGNOSIS — I69154 Hemiplegia and hemiparesis following nontraumatic intracerebral hemorrhage affecting left non-dominant side: Secondary | ICD-10-CM | POA: Diagnosis not present

## 2020-02-27 DIAGNOSIS — Z79891 Long term (current) use of opiate analgesic: Secondary | ICD-10-CM

## 2020-02-27 DIAGNOSIS — I959 Hypotension, unspecified: Secondary | ICD-10-CM | POA: Diagnosis not present

## 2020-02-27 DIAGNOSIS — M549 Dorsalgia, unspecified: Secondary | ICD-10-CM | POA: Diagnosis present

## 2020-02-27 DIAGNOSIS — R339 Retention of urine, unspecified: Secondary | ICD-10-CM | POA: Diagnosis not present

## 2020-02-27 DIAGNOSIS — G4700 Insomnia, unspecified: Secondary | ICD-10-CM

## 2020-02-27 DIAGNOSIS — G894 Chronic pain syndrome: Secondary | ICD-10-CM

## 2020-02-27 DIAGNOSIS — G8929 Other chronic pain: Secondary | ICD-10-CM | POA: Diagnosis present

## 2020-02-27 DIAGNOSIS — F17210 Nicotine dependence, cigarettes, uncomplicated: Secondary | ICD-10-CM | POA: Diagnosis present

## 2020-02-27 DIAGNOSIS — Z823 Family history of stroke: Secondary | ICD-10-CM | POA: Diagnosis not present

## 2020-02-27 DIAGNOSIS — I69122 Dysarthria following nontraumatic intracerebral hemorrhage: Secondary | ICD-10-CM | POA: Diagnosis not present

## 2020-02-27 DIAGNOSIS — F330 Major depressive disorder, recurrent, mild: Secondary | ICD-10-CM

## 2020-02-27 DIAGNOSIS — Z87442 Personal history of urinary calculi: Secondary | ICD-10-CM | POA: Diagnosis not present

## 2020-02-27 DIAGNOSIS — Z7982 Long term (current) use of aspirin: Secondary | ICD-10-CM | POA: Diagnosis not present

## 2020-02-27 DIAGNOSIS — I10 Essential (primary) hypertension: Secondary | ICD-10-CM

## 2020-02-27 DIAGNOSIS — I69193 Ataxia following nontraumatic intracerebral hemorrhage: Secondary | ICD-10-CM

## 2020-02-27 DIAGNOSIS — Z800 Family history of malignant neoplasm of digestive organs: Secondary | ICD-10-CM

## 2020-02-27 DIAGNOSIS — G479 Sleep disorder, unspecified: Secondary | ICD-10-CM | POA: Diagnosis not present

## 2020-02-27 DIAGNOSIS — Z8249 Family history of ischemic heart disease and other diseases of the circulatory system: Secondary | ICD-10-CM | POA: Diagnosis not present

## 2020-02-27 DIAGNOSIS — Z79899 Other long term (current) drug therapy: Secondary | ICD-10-CM | POA: Diagnosis not present

## 2020-02-27 DIAGNOSIS — R509 Fever, unspecified: Secondary | ICD-10-CM

## 2020-02-27 DIAGNOSIS — I610 Nontraumatic intracerebral hemorrhage in hemisphere, subcortical: Secondary | ICD-10-CM | POA: Diagnosis not present

## 2020-02-27 DIAGNOSIS — I612 Nontraumatic intracerebral hemorrhage in hemisphere, unspecified: Secondary | ICD-10-CM

## 2020-02-27 DIAGNOSIS — G4733 Obstructive sleep apnea (adult) (pediatric): Secondary | ICD-10-CM | POA: Diagnosis present

## 2020-02-27 DIAGNOSIS — F5104 Psychophysiologic insomnia: Secondary | ICD-10-CM | POA: Diagnosis present

## 2020-02-27 LAB — BASIC METABOLIC PANEL
Anion gap: 9 (ref 5–15)
BUN: 13 mg/dL (ref 8–23)
CO2: 24 mmol/L (ref 22–32)
Calcium: 9 mg/dL (ref 8.9–10.3)
Chloride: 105 mmol/L (ref 98–111)
Creatinine, Ser: 0.99 mg/dL (ref 0.61–1.24)
GFR calc Af Amer: >60 mL/min (ref >60)
GFR calc non Af Amer: >60 mL/min (ref >60)
Glucose, Bld: 92 mg/dL (ref 70–99)
Potassium: 4 mmol/L (ref 3.5–5.1)
Sodium: 138 mmol/L (ref 135–145)

## 2020-02-27 MED ORDER — DIPHENHYDRAMINE HCL 12.5 MG/5ML PO ELIX: 12.5000 mg | ORAL_SOLUTION | Freq: Four times a day (QID) | ORAL | Status: DC | PRN

## 2020-02-27 MED ORDER — SENNOSIDES-DOCUSATE SODIUM 8.6-50 MG PO TABS
1.0000 | ORAL_TABLET | Freq: Two times a day (BID) | ORAL | Status: DC
  Administered 2020-02-27 – 2020-03-07 (×18): 1 via ORAL
  Filled 2020-02-27 (×18): qty 1

## 2020-02-27 MED ORDER — GUAIFENESIN-DM 100-10 MG/5ML PO SYRP: 5.0000 mL | ORAL_SOLUTION | Freq: Four times a day (QID) | ORAL | Status: DC | PRN

## 2020-02-27 MED ORDER — FLEET ENEMA 7-19 GM/118ML RE ENEM: 1.0000 | ENEMA | Freq: Once | RECTAL | Status: DC | PRN

## 2020-02-27 MED ORDER — IRBESARTAN 75 MG PO TABS
37.5000 mg | ORAL_TABLET | Freq: Every day | ORAL | Status: DC
  Administered 2020-02-28 – 2020-03-05 (×7): 37.5 mg via ORAL
  Filled 2020-02-27 (×7): qty 1

## 2020-02-27 MED ORDER — BISACODYL 10 MG RE SUPP: 10.0000 mg | Freq: Every day | RECTAL | Status: DC | PRN

## 2020-02-27 MED ORDER — POLYETHYLENE GLYCOL 3350 17 G PO PACK
17.0000 g | PACK | Freq: Every day | ORAL | Status: DC | PRN
  Administered 2020-03-01 – 2020-03-06 (×3): 17 g via ORAL
  Filled 2020-02-27 (×3): qty 1

## 2020-02-27 MED ORDER — AMLODIPINE BESYLATE 2.5 MG PO TABS
2.5000 mg | ORAL_TABLET | Freq: Every day | ORAL | Status: DC
  Administered 2020-02-28 – 2020-03-01 (×3): 2.5 mg via ORAL
  Filled 2020-02-27 (×3): qty 1

## 2020-02-27 MED ORDER — TRAZODONE HCL 50 MG PO TABS
25.0000 mg | ORAL_TABLET | Freq: Every evening | ORAL | Status: DC | PRN
  Filled 2020-02-27: qty 1

## 2020-02-27 MED ORDER — ALUM & MAG HYDROXIDE-SIMETH 200-200-20 MG/5ML PO SUSP: 30.0000 mL | ORAL | Status: DC | PRN

## 2020-02-27 MED ORDER — PROCHLORPERAZINE 25 MG RE SUPP: 12.5000 mg | Freq: Four times a day (QID) | RECTAL | Status: DC | PRN

## 2020-02-27 MED ORDER — PROCHLORPERAZINE EDISYLATE 10 MG/2ML IJ SOLN: 5.0000 mg | Freq: Four times a day (QID) | INTRAMUSCULAR | Status: DC | PRN

## 2020-02-27 MED ORDER — CHLORHEXIDINE GLUCONATE CLOTH 2 % EX PADS: 6.0000 | MEDICATED_PAD | Freq: Every day | CUTANEOUS | Status: DC

## 2020-02-27 MED ORDER — MORPHINE SULFATE ER 15 MG PO TBCR
15.0000 mg | EXTENDED_RELEASE_TABLET | Freq: Two times a day (BID) | ORAL | Status: DC
  Administered 2020-02-27 – 2020-03-07 (×18): 15 mg via ORAL
  Filled 2020-02-27 (×18): qty 1

## 2020-02-27 MED ORDER — TRAZODONE HCL 50 MG PO TABS
50.0000 mg | ORAL_TABLET | Freq: Every day | ORAL | Status: DC
  Administered 2020-02-27 – 2020-03-03 (×6): 50 mg via ORAL
  Filled 2020-02-27 (×5): qty 1

## 2020-02-27 MED ORDER — ACETAMINOPHEN 325 MG PO TABS
325.0000 mg | ORAL_TABLET | ORAL | Status: DC | PRN
  Administered 2020-03-02 – 2020-03-06 (×3): 650 mg via ORAL
  Filled 2020-02-27 (×2): qty 2

## 2020-02-27 MED ORDER — PROCHLORPERAZINE MALEATE 5 MG PO TABS: 5.0000 mg | ORAL_TABLET | Freq: Four times a day (QID) | ORAL | Status: DC | PRN

## 2020-02-27 MED ORDER — PANTOPRAZOLE SODIUM 40 MG PO TBEC
40.0000 mg | DELAYED_RELEASE_TABLET | Freq: Every day | ORAL | Status: DC
  Administered 2020-02-28 – 2020-03-01 (×3): 40 mg via ORAL
  Filled 2020-02-27 (×3): qty 1

## 2020-02-27 NOTE — PMR Pre-admission (Signed)
PMR Admission Coordinator Pre-Admission Assessment  Patient: Richard Vaughn is an 69 y.o., male MRN: [id number] DOB: 1951-03-21 Height: 6\' 2"  (188 cm)Weight: 115.5 kg              Insurance Information HMO:     PPO:      PCP:      IPA:      80/20:      OTHER:  PRIMARY: Medicare a and b      Policy#: [id number]      Subscriber: pt Benefits:  Phone #: passport one online     Name: 4/14 Eff. Date: 10/15/2018     Deduct: $1484      Out of Pocket Max: none      Life Max: none  [name]: 1005      SNF: 20 full days Outpatient: 80%     Co-Pay: [place]: 1005      Co-Pay: none DME: 80%     Co-Pay: 20% Providers: pt choice  SECONDARY: Roseanna Rainbow      Policy#: [id number]       Financial Counselor:       Phone#: n/a  The "Data Collection Information Summary" for patients in Inpatient Rehabilitation Facilities with attached "Privacy Act [place] Records" was provided and verbally reviewed with: Patient and Family  Emergency Contact Information Contact Information    Name Relation Home Work [place] Other [phone]    Hockey,Hildagene Sister [phone]       Current Medical History  Patient Admitting Diagnosis: CVA  History of Present Illness:  69 y.o. right-handed male with history of hypertension, chronic back pain maintained on Opana 10 mg twice daily, quit smoking 22 months ago, hyperlipidemia, Barrett's esophagus, multiple lacunar infarcts, prior small lacunar infarcts, prior small HTN bleeds and bilateral deep gray-white matter structures with most recent ICH in the right thalamus/posterior limb of internal capsule 2019 with mild residual left sided weakness. Presented 02/22/2020 with increasing left-sided weakness and slurred speech.  Cranial CT scan positive for acute right thalamic hemorrhage with mild surrounding edema with minimal mass-effect.  CT angiogram head and neck showed no large vessel occlusion there was an incidental finding of a  small chronic 14 mm left thyroid nodule.  Patient did not receive TPA.  I 2.1 x 2.1 x 2.2 cm acute intraparenchymal hemorrhage centered at the right thalamus.  Multiple remote lacunar infarcts involving the left basal ganglia and thalamus.  Echocardiogram with ejection fraction of 65%.  Neurology services consulted maintained on Cleviprex for blood pressure control.  Tolerating a regular diet.    Complete NIHSS TOTAL: 3 Glasgow Coma Scale Score: 15  Past Medical History  Past Medical History:  Diagnosis Date  . Barrett's esophagus   . Chronic back pain   . Gastritis    Mild  . Gastroparesis   . GERD (gastroesophageal reflux disease)   . Headache   . Hyperglycemia   . Hypertension   . Nephrolithiasis   . Renal cyst   . Stricture and stenosis of esophagus   . Stroke ([place]) 07-2015  . Vision abnormalities     Family History  family history includes Hypertension in his brother, father, mother, and sister; Liver cancer in his mother; Stroke in his father, sister, and sister.  Prior Rehab/Hospitalizations:  Has the patient had prior rehab or hospitalizations prior to admission? Yes  Has the patient had major surgery during 100 days prior to admission? No  Current Medications   Current  Facility-Administered Medications:  .  acetaminophen (TYLENOL) tablet 650 mg, 650 mg, Oral, Q4H PRN, 650 mg at 02/25/20 2000 **OR** acetaminophen (TYLENOL) 160 MG/5ML solution 650 mg, 650 mg, Per Tube, Q4H PRN **OR** acetaminophen (TYLENOL) suppository 650 mg, 650 mg, Rectal, Q4H PRN, Garvin Fila, MD, 650 mg at 02/23/20 0040 .  amLODipine (NORVASC) tablet 2.5 mg, 2.5 mg, Oral, Daily, Garvin Fila, MD, 2.5 mg at 02/27/20 0940 .  Chlorhexidine Gluconate Cloth 2 % PADS 6 each, 6 each, Topical, Daily, Garvin Fila, MD, 6 each at 02/27/20 0941 .  irbesartan (AVAPRO) tablet 37.5 mg, 37.5 mg, Oral, Daily, Garvin Fila, MD, 37.5 mg at 02/27/20 0941 .  morphine (MS CONTIN) 12 hr tablet 15 mg, 15 mg,  Oral, BID, Garvin Fila, MD, 15 mg at 02/27/20 0940 .  pantoprazole (PROTONIX) EC tablet 40 mg, 40 mg, Oral, Daily, Garvin Fila, MD, 40 mg at 02/27/20 0940 .  senna-docusate (Senokot-S) tablet 1 tablet, 1 tablet, Oral, BID, Garvin Fila, MD, 1 tablet at 02/27/20 0940 .  traZODone (DESYREL) tablet 50 mg, 50 mg, Oral, QHS, Garvin Fila, MD, 50 mg at 02/26/20 2226  Patients Current Diet:  Diet Order            Diet Heart Room service appropriate? Yes; Fluid consistency: Thin  Diet effective now              Precautions / Restrictions Precautions Precautions: Fall, Other (comment) Precaution Comments: SBP<160 Restrictions Weight Bearing Restrictions: No   Has the patient had 2 or more falls or a fall with injury in the past year?No  Prior Activity Level Community (5-7x/wk): Independent and driving; disabled due to back issues ; was Tour manager  Prior Functional Level Prior Function Level of Independence: Independent Comments: pt reports he walks as his hobby  Self Care: Did the patient need help bathing, dressing, using the toilet or eating?  Independent  Indoor Mobility: Did the patient need assistance with walking from room to room (with or without device)? Independent  Stairs: Did the patient need assistance with internal or external stairs (with or without device)? Independent  Functional Cognition: Did the patient need help planning regular tasks such as shopping or remembering to take medications? Independent  Home Assistive Devices / Equipment Home Equipment: Walker - 2 wheels, Cane - single point, Bedside commode  Prior Device Use: Indicate devices/aids used by the patient prior to current illness, exacerbation or injury? None of the above  Current Functional Level Cognition  Overall Cognitive Status: Within Functional Limits for tasks assessed Orientation Level: Oriented X4 Following Commands: Follows one step commands consistently, Follows  multi-step commands with increased time Safety/Judgement: Decreased awareness of safety, Decreased awareness of deficits General Comments: Pt tearful during session.    Extremity Assessment (includes Sensation/Coordination)  Upper Extremity Assessment: LUE deficits/detail LUE Deficits / Details: ROM, WFLs; 4/5 MM grade;difficulty gripping RW LUE Coordination: decreased fine motor  Lower Extremity Assessment: Defer to PT evaluation LLE Deficits / Details: LLE decreased step height    ADLs  Overall ADL's : Needs assistance/impaired Eating/Feeding: Set up, Sitting Grooming: Min guard, Standing Upper Body Bathing: Min guard, Standing Lower Body Bathing: Moderate assistance, Sitting/lateral leans, Sit to/from stand, Cueing for safety, Cueing for sequencing Upper Body Dressing : Min guard, Sitting, Standing Lower Body Dressing: Moderate assistance, Cueing for safety, Cueing for sequencing, Sitting/lateral leans, Sit to/from stand Lower Body Dressing Details (indicate cue type and reason): donning shoes, but unable to  tie them Toilet Transfer: Moderate assistance, Ambulation, BSC, RW Toileting- Clothing Manipulation and Hygiene: Minimal assistance, Cueing for safety, Sit to/from stand, Sitting/lateral lean Functional mobility during ADLs: Moderate assistance, Cueing for safety, Cueing for sequencing, Rolling walker General ADL Comments: Pt with decreased insight into deficits, decreased safety awareness and decreased ability to care for self safely.    Mobility  Overal bed mobility: Needs Assistance Bed Mobility: Supine to Sit Supine to sit: HOB elevated, Min assist General bed mobility comments: Min A needed to get LLE to EOB (stuck btw sheet and end plate).    Transfers  Overall transfer level: Needs assistance Equipment used: None Transfers: Sit to/from Stand, Stand Pivot Transfers Sit to Stand: Mod assist Stand pivot transfers: Mod assist General transfer comment: Assist to power  to standing wtih cues to push through BUEs/LEs for equal weight distribution for all transitions. Stood from [employer], from chair x2.    Ambulation / Gait / Stairs / Wheelchair Mobility  Ambulation/Gait Ambulation/Gait assistance: Herbalist (Feet): 60 Feet(+75') Assistive device: 1 person hand held assist(vs rail) Gait Pattern/deviations: Decreased step length - left, Decreased dorsiflexion - left, Step-through pattern, Narrow base of support General Gait Details: Slow, unsteady gait leaving with difficulty clearing LLE especially when fatigued; used rail in hallway for support progressing to HHA. 1 seated rest break. Gait velocity: reduced Gait velocity interpretation: <1.8 ft/sec, indicate of risk for recurrent falls    Posture / Balance Dynamic Sitting Balance Sitting balance - Comments: minG at edge of bed Balance Overall balance assessment: Needs assistance Sitting-balance support: Feet supported, No upper extremity supported Sitting balance-Leahy Scale: Fair Sitting balance - Comments: minG at edge of bed Standing balance support: During functional activity Standing balance-Leahy Scale: Poor Standing balance comment: Min guard-Min A for static balance.    Special needs/care consideration Designated visitor  is wife [name]     Previous Home Environment  Living Arrangements: Spouse/significant other  Lives With: Spouse Available Help at Discharge: Family, Available 24 hours/day Type of Home: House Home Layout: One level Home Access: Stairs to enter Entrance Stairs-Rails: Right Entrance Stairs-Number of Steps: 3 Bathroom Shower/Tub: Multimedia programmer: Standard Bathroom Accessibility: Yes How Accessible: Accessible via walker [place]: No  Discharge Living Setting Plans for Discharge Living Setting: Patient's home, Lives with (comment)(wife) Type of Home at Discharge: House Discharge Home Layout: One level Discharge Home Access:  Stairs to enter Entrance Stairs-Rails: Right Entrance Stairs-Number of Steps: 3 Discharge Bathroom Shower/Tub: Walk-in shower Discharge Bathroom Toilet: Standard Discharge Bathroom Accessibility: Yes How Accessible: Accessible via walker Does the patient have any problems obtaining your medications?: No  Social/Family/Support Systems Patient Roles: Spouse, Parent Contact Information: wife, [name] Anticipated Caregiver: wife Anticipated Ambulance person Information: see above Ability/Limitations of Caregiver: no limitations Caregiver Availability: 24/7 Discharge Plan Discussed with Primary Caregiver: Yes Is Caregiver In Agreement with Plan?: Yes Does Caregiver/Family have Issues with Lodging/Transportation while Pt is in Rehab?: No   Goals Patient/Family Goal for Rehab: Mod I to supervision with PT, OT, and SLP Expected length of stay: [facility]OS 9 to 12 days Pt/Family Agrees to Admission and willing to participate: Yes Program Orientation Provided & Reviewed with Pt/Caregiver Including Roles  & Responsibilities: Yes   Decrease burden of Care through IP rehab admission: n/a  Possible need for SNF placement upon discharge: not anticipated  Patient Condition: This patient's condition remains as documented in the consult dated 02/25/2020, in which the Rehabilitation Physician determined and documented that the patient's  condition is appropriate for intensive rehabilitative care in an inpatient rehabilitation facility. Will admit to inpatient rehab today.  Preadmission Screen Completed By:  Cleatrice Burke, RN, 02/27/2020 11:11 AM ______________________________________________________________________   Discussed status with Dr. Posey Pronto on 02/27/2020 at  1121 and received approval for admission today.  Admission Coordinator:  Cleatrice Burke, time [id number] Date 02/27/2020

## 2020-02-27 NOTE — Progress Notes (Signed)
Cristina Gong, RN  Rehab Admission Coordinator  Physical Medicine and Rehabilitation  PMR Pre-admission     Signed  Date of Service:  02/27/2020 11:11 AM      Related encounter: ED to Hosp-Admission (Discharged) from 02/22/2020 in [facility]      Signed        Show:Clear all [x] Manual[x] Template[x] Copied  Added by: [x] Cristina Gong, RN  [] Hover for details PMR Admission Coordinator Pre-Admission Assessment   Patient: Richard Vaughn is an 69 y.o., male MRN: [id number] DOB: Oct 27, 1951 Height: 6\' 2"  (188 cm)Weight: 115.5 kg                                                                                                                                                  Insurance Information HMO:     PPO:      PCP:      IPA:      80/20:      OTHER:  PRIMARY: Medicare a and b      Policy#: [id number]      Subscriber: pt Benefits:  Phone #: passport one online     Name: 4/14 Eff. Date: 10/15/2018     Deduct: $1484      Out of Pocket Max: none      Life Max: none  [name]: 1005      SNF: 20 full days Outpatient: 80%     Co-Pay: [place]: 1005      Co-Pay: none DME: 80%     Co-Pay: 20% Providers: pt choice  SECONDARY: Roseanna Rainbow      Policy#: [id number]        Financial Counselor:       Phone#: n/a   The "Data Collection Information Summary" for patients in Inpatient Rehabilitation Facilities with attached "Privacy Act [place] Records" was provided and verbally reviewed with: Patient and Family   Emergency Contact Information         Contact Information     Name Relation Home Work [place] Other [phone]      [place] Sister [phone]           Current Medical History  Patient Admitting Diagnosis: CVA   History of Present Illness:  69 y.o. 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 Presented 02/22/2020 with increasing left-sided weakness and slurred speech.  Cranial CT scan positive for acute right thalamic hemorrhage with mild surrounding edema with minimal mass-effect.  CT angiogram head and neck showed no large vessel  occlusion there was an incidental finding of a small chronic 14 mm left thyroid nodule.  Patient did not receive TPA.  I 2.1 x 2.1 x 2.2 cm acute intraparenchymal hemorrhage centered at the right thalamus.  Multiple remote lacunar infarcts involving the left basal ganglia and thalamus.  Echocardiogram with ejection fraction of 65%.  Neurology services consulted maintained on Cleviprex for blood pressure control.  Tolerating a regular diet.     Complete NIHSS TOTAL: 3 Glasgow Coma Scale Score: 15   Past Medical History      Past Medical History:  Diagnosis Date  . Barrett's esophagus    . Chronic back pain    . Gastritis      Mild  . Gastroparesis    . GERD (gastroesophageal reflux disease)    . Headache    . Hyperglycemia    . Hypertension    . Nephrolithiasis    . Renal cyst    . Stricture and stenosis of esophagus    . Stroke ([place]) 07-2015  . Vision abnormalities        Family History  family history includes Hypertension in his brother, father, mother, and sister; Liver cancer in his mother; Stroke in his father, sister, and sister.   Prior Rehab/Hospitalizations:  Has the patient had prior rehab or hospitalizations prior to admission? Yes   Has the patient had major surgery during 100 days prior to admission? No   Current Medications    Current Facility-Administered Medications:  .  acetaminophen (TYLENOL) tablet 650 mg, 650 mg, Oral, Q4H PRN, 650 mg at  02/25/20 2000 **OR** acetaminophen (TYLENOL) 160 MG/5ML solution 650 mg, 650 mg, Per Tube, Q4H PRN **OR** acetaminophen (TYLENOL) suppository 650 mg, 650 mg, Rectal, Q4H PRN, Garvin Fila, MD, 650 mg at 02/23/20 0040 .  amLODipine (NORVASC) tablet 2.5 mg, 2.5 mg, Oral, Daily, Garvin Fila, MD, 2.5 mg at 02/27/20 0940 .  Chlorhexidine Gluconate Cloth 2 % PADS 6 each, 6 each, Topical, Daily, Garvin Fila, MD, 6 each at 02/27/20 0941 .  irbesartan (AVAPRO) tablet 37.5 mg, 37.5 mg, Oral, Daily, Garvin Fila, MD, 37.5 mg at 02/27/20 0941 .  morphine (MS CONTIN) 12 hr tablet 15 mg, 15 mg, Oral, BID, Garvin Fila, MD, 15 mg at 02/27/20 0940 .  pantoprazole (PROTONIX) EC tablet 40 mg, 40 mg, Oral, Daily, Garvin Fila, MD, 40 mg at 02/27/20 0940 .  senna-docusate (Senokot-S) tablet 1 tablet, 1 tablet, Oral, BID, Garvin Fila, MD, 1 tablet at 02/27/20 0940 .  traZODone (DESYREL) tablet 50 mg, 50 mg, Oral, QHS, Garvin Fila, MD, 50 mg at 02/26/20 2226   Patients Current Diet:     Diet Order                      Diet Heart Room service appropriate? Yes; Fluid consistency: Thin  Diet effective now                   Precautions / Restrictions Precautions Precautions: Fall, Other (comment) Precaution Comments: SBP<160 Restrictions Weight Bearing Restrictions: No    Has the patient had 2 or more falls or a fall with injury in the past year?No   Prior Activity Level Community (5-7x/wk): Independent and driving; disabled due to back issues ; was Tour manager   Prior Functional Level Prior Function Level of Independence: Independent Comments: pt reports he walks as his hobby   Self Care: Did the patient need  help bathing, dressing, using the toilet or eating?  Independent   Indoor Mobility: Did the patient need assistance with walking from room to room (with or without device)? Independent   Stairs: Did the patient need assistance with internal or external stairs  (with or without device)? Independent   Functional Cognition: Did the patient need help planning regular tasks such as shopping or remembering to take medications? Independent   Home Assistive Devices / Equipment Home Equipment: Walker - 2 wheels, Cane - single point, Bedside commode   Prior Device Use: Indicate devices/aids used by the patient prior to current illness, exacerbation or injury? None of the above   Current Functional Level Cognition   Overall Cognitive Status: Within Functional Limits for tasks assessed Orientation Level: Oriented X4 Following Commands: Follows one step commands consistently, Follows multi-step commands with increased time Safety/Judgement: Decreased awareness of safety, Decreased awareness of deficits General Comments: Pt tearful during session.    Extremity Assessment (includes Sensation/Coordination)   Upper Extremity Assessment: LUE deficits/detail LUE Deficits / Details: ROM, WFLs; 4/5 MM grade;difficulty gripping RW LUE Coordination: decreased fine motor  Lower Extremity Assessment: Defer to PT evaluation LLE Deficits / Details: LLE decreased step height     ADLs   Overall ADL's : Needs assistance/impaired Eating/Feeding: Set up, Sitting Grooming: Min guard, Standing Upper Body Bathing: Min guard, Standing Lower Body Bathing: Moderate assistance, Sitting/lateral leans, Sit to/from stand, Cueing for safety, Cueing for sequencing Upper Body Dressing : Min guard, Sitting, Standing Lower Body Dressing: Moderate assistance, Cueing for safety, Cueing for sequencing, Sitting/lateral leans, Sit to/from stand Lower Body Dressing Details (indicate cue type and reason): donning shoes, but unable to tie them Toilet Transfer: Moderate assistance, Ambulation, BSC, RW Toileting- Clothing Manipulation and Hygiene: Minimal assistance, Cueing for safety, Sit to/from stand, Sitting/lateral lean Functional mobility during ADLs: Moderate assistance, Cueing for  safety, Cueing for sequencing, Rolling walker General ADL Comments: Pt with decreased insight into deficits, decreased safety awareness and decreased ability to care for self safely.     Mobility   Overal bed mobility: Needs Assistance Bed Mobility: Supine to Sit Supine to sit: HOB elevated, Min assist General bed mobility comments: Min A needed to get LLE to EOB (stuck btw sheet and end plate).     Transfers   Overall transfer level: Needs assistance Equipment used: None Transfers: Sit to/from Stand, Stand Pivot Transfers Sit to Stand: Mod assist Stand pivot transfers: Mod assist General transfer comment: Assist to power to standing wtih cues to push through BUEs/LEs for equal weight distribution for all transitions. Stood from [employer], from chair x2.     Ambulation / Gait / Stairs / Wheelchair Mobility   Ambulation/Gait Ambulation/Gait assistance: Herbalist (Feet): 60 Feet(+75') Assistive device: 1 person hand held assist(vs rail) Gait Pattern/deviations: Decreased step length - left, Decreased dorsiflexion - left, Step-through pattern, Narrow base of support General Gait Details: Slow, unsteady gait leaving with difficulty clearing LLE especially when fatigued; used rail in hallway for support progressing to HHA. 1 seated rest break. Gait velocity: reduced Gait velocity interpretation: <1.8 ft/sec, indicate of risk for recurrent falls     Posture / Balance Dynamic Sitting Balance Sitting balance - Comments: minG at edge of bed Balance Overall balance assessment: Needs assistance Sitting-balance support: Feet supported, No upper extremity supported Sitting balance-Leahy Scale: Fair Sitting balance - Comments: minG at edge of bed Standing balance support: During functional activity Standing balance-Leahy Scale: Poor Standing balance comment: Min guard-Min A for  static balance.     Special needs/care consideration Designated visitor  is wife [name]         Previous Home Environment  Living Arrangements: Spouse/significant other  Lives With: Spouse Available Help at Discharge: Family, Available 24 hours/day Type of Home: House Home Layout: One level Home Access: Stairs to enter Entrance Stairs-Rails: Right Entrance Stairs-Number of Steps: 3 Bathroom Shower/Tub: Multimedia programmer: Standard Bathroom Accessibility: Yes How Accessible: Accessible via walker Gaylord: No   Discharge Living Setting Plans for Discharge Living Setting: Patient's home, Lives with (comment)(wife) Type of Home at Discharge: House Discharge Home Layout: One level Discharge Home Access: Stairs to enter Entrance Stairs-Rails: Right Entrance Stairs-Number of Steps: 3 Discharge Bathroom Shower/Tub: Walk-in shower Discharge Bathroom Toilet: Standard Discharge Bathroom Accessibility: Yes How Accessible: Accessible via walker Does the patient have any problems obtaining your medications?: No   Social/Family/Support Systems Patient Roles: Spouse, Parent Contact Information: wife, [name] Anticipated Caregiver: wife Anticipated Ambulance person Information: see above Ability/Limitations of Caregiver: no limitations Caregiver Availability: 24/7 Discharge Plan Discussed with Primary Caregiver: Yes Is Caregiver In Agreement with Plan?: Yes Does Caregiver/Family have Issues with Lodging/Transportation while Pt is in Rehab?: No     Goals Patient/Family Goal for Rehab: Mod I to supervision with PT, OT, and SLP Expected length of stay: ELOS 9 to 12 days Pt/Family Agrees to Admission and willing to participate: Yes Program Orientation Provided & Reviewed with Pt/Caregiver Including Roles  & Responsibilities: Yes     Decrease burden of Care through IP rehab admission: n/a   Possible need for SNF placement upon discharge: not anticipated   Patient Condition: This patient's condition remains as documented in the consult dated 02/25/2020,  in which the Rehabilitation Physician determined and documented that the patient's condition is appropriate for intensive rehabilitative care in an inpatient rehabilitation facility. Will admit to inpatient rehab today.   Preadmission Screen Completed By:  Cleatrice Burke, RN, 02/27/2020 11:11 AM ______________________________________________________________________   Discussed status with Dr. Posey Pronto on 02/27/2020 at  1121 and received approval for admission today.   Admission Coordinator:  Cleatrice Burke, time [id number] Date 02/27/2020             Cosigned by: Jamse Arn, MD at 02/27/2020 11:26 AM  Revision History

## 2020-02-27 NOTE — Progress Notes (Signed)
Report received about 1300 from [place] on 3W. Patient is alert and able to make needs known. Has weakness due to previous stroke and ICH.  Able to take meds whole with water, regular diet, no skin issues noted.   Patient arrived to unit about 1500 with his wife at bedside. Patient alert with slurred speech. Skin intact, lungs clear, bowel sounds in all 4 quadrants. Patient was asked when his last BM was and he stated 02/26/20. Last documented was 4/10. Patient able to use urinal at bedside. Wife aware of mask and visitor policy with verbal understanding.

## 2020-02-27 NOTE — H&P (Signed)
Physical Medicine and Rehabilitation Admission H&P    Chief Complaint  Patient presents with  . ICH with functional deficits.     HPI: Richard Vaughn is a 69 year old male with history of Barrett's esophagus with GERD/ hx of esophageal stricture, HTN, chronic pain--on opana, hypertensive bleeds--last 04/2018, multiple lacunar bleeds, OSA who was admitted on 02/22/2020 with headache, visual changes, left hemiparesis, and slurred speech. CT head showed acute right thalamic hemorrhage with mild surrounding edema and min mas effect as well as advanced chronic small vessel disease. CTA head/neck negative for LVO and 14 mm left thyroid nodule noted with recommendations for dedicated thyroid ultrasound.  Echocardiogram with EF of 60-65% with no wall abnormality and moderate LVH. 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 Dr. 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 Hospital course further complicated by low grade fever on 4/12, ? due to viral bronchopneumonia. Therapy ongoing and patient limited by LLE weakness with ataxia, dysarthria and LUE weakness affecting ADLs. [name] recommended due to functional decline. Please see preadmission assessment from earlier today as well.    Review of Systems  Constitutional: Negative for fever.  HENT: Negative for hearing loss and tinnitus.   Eyes: Negative for blurred vision and double vision.  Respiratory: Negative for cough and shortness of breath.   Cardiovascular: Negative for chest pain.  Gastrointestinal: Positive for heartburn. Negative for abdominal pain, constipation and nausea.  Genitourinary: Negative for dysuria and urgency.  Musculoskeletal: Positive for back pain and myalgias.  Skin: Negative for itching and rash.  Neurological: Positive for dizziness and focal weakness.  Negative for sensory change.  Psychiatric/Behavioral: The patient has insomnia. The patient is not nervous/anxious.    Past Medical History:  Diagnosis Date  . Barrett's esophagus   . Chronic back pain   . Gastritis    Mild  . Gastroparesis   . GERD (gastroesophageal reflux disease)   . Headache   . Hyperglycemia   . Hypertension   . Nephrolithiasis   . Renal cyst   . Stricture and stenosis of esophagus   . Stroke ([place]) 07-2015  . Vision abnormalities     Past Surgical History:  Procedure Laterality Date  . [place] SURGERY  2012  . COLONOSCOPY    . [place] SURGERY  2005, 2010   replaced L4 and L5  . UPPER GASTROINTESTINAL ENDOSCOPY      Family History  Problem Relation Age of Onset  . Hypertension Father   . Stroke Father   . Hypertension Mother   . Liver cancer Mother   . Hypertension Sister        2 other sisters has also  . Stroke Sister   . Stroke Sister   . Hypertension Brother        2nd brother has also  . Colon cancer Neg Hx   . Esophageal cancer Neg Hx   . Rectal cancer Neg Hx   . Stomach cancer Neg Hx   . Colon polyps Neg Hx     Social History:  Married. Independent PTA. Retired from [employer]. Active walks 2/5 hrs 3 days week and does water aerobics twice week--loves to garden. Wife supportive.  He  reports that he has been smoking about 1/2 PPD.  His smoking use included cigarettes. He  has a 15.00 pack-year smoking history. He has never used smokeless tobacco. He reports current drug use. Drugs: Opana. He reports that he does not drink alcohol.    Allergies: No Known Allergies    Facility-Administered Medications Prior to Admission  Medication Dose Route Frequency Provider Last Rate Last Admin  . 0.9 %  sodium chloride infusion  500 mL Intravenous Continuous Armbruster, Carlota Raspberry, MD       Medications Prior to Admission  Medication Sig Dispense Refill  . amLODipine (NORVASC) 2.5 MG tablet Take 1 tablet (2.5 mg total) by mouth  daily. 90 tablet 2  . aspirin 81 MG tablet Take 1 tablet (81 mg total) by mouth daily. 30 tablet   . esomeprazole (NEXIUM) 40 MG capsule Take 1 capsule (40 mg total) by mouth 2 (two) times daily before a meal. Please schedule a yearly follow up for any further refills.  Last seen 01-2019. (Patient taking differently: Take 40 mg by mouth 2 (two) times daily before a meal. ) 60 capsule 0  . naproxen (NAPROSYN) 500 MG tablet Take 500 mg by mouth 2 (two) times daily.    Marland Kitchen olmesartan (BENICAR) 20 MG tablet Take 1 tablet (20 mg total) by mouth daily. 90 tablet 2  . oxymorphone (OPANA) 10 MG tablet Take 10 mg by mouth in the morning and at bedtime.     . traZODone (DESYREL) 50 MG tablet TAKE 1 TABLET AT BEDTIME   NEEDS FOLLOW-UP APPOINTMENTBEFORE MORE REFILLS ARE    AUTHORIZED (Patient taking differently: Take 50 mg by mouth at bedtime. ) 90 tablet 2    Drug Regimen Review  Drug regimen was reviewed and remains appropriate with no significant issues identified  Home: Home Living Family/patient expects to be discharged to:: Private residence Living Arrangements: Spouse/significant other Available Help at Discharge: Family, Available 24 hours/day Type of Home: House Home Access: Stairs to enter Technical brewer of Steps: 3 Entrance Stairs-Rails: Right Home Layout: One level Bathroom Shower/Tub: Multimedia programmer: [place]: Environmental consultant - 2 wheels, Cane - single point, Bedside commode   Functional History: Prior Function Level of Independence: Independent Comments: pt reports he walks as his hobby  Functional Status:  Mobility: Bed Mobility Overal bed mobility: Needs Assistance Bed Mobility: Supine to Sit Supine to sit: HOB elevated, Min assist General bed mobility comments: Min A needed to get LLE to EOB (stuck btw sheet and end plate). Transfers Overall transfer level: Needs assistance Equipment used: None Transfers: Sit to/from Stand, Stand Pivot  Transfers Sit to Stand: Mod assist Stand pivot transfers: Mod assist General transfer comment: Assist to power to standing wtih cues to push through BUEs/LEs for equal weight distribution for all transitions. Stood from [employer], from chair x2. Ambulation/Gait Ambulation/Gait assistance: Min assist Gait Distance (Feet): 60 Feet(+75') Assistive device: 1 person hand held assist(vs rail) Gait Pattern/deviations: Decreased step length - left, Decreased dorsiflexion - left, Step-through pattern, Narrow base of support General Gait Details: Slow, unsteady gait leaving with difficulty clearing LLE especially when fatigued; used rail in hallway for support progressing to HHA. 1 seated rest break. Gait velocity: reduced Gait velocity interpretation: <1.8 ft/sec, indicate of risk for recurrent falls    ADL: ADL Overall ADL's : Needs assistance/impaired Eating/Feeding: Set up, Sitting Grooming: Min guard, Standing Upper Body Bathing: Min guard, Standing Lower Body Bathing: Moderate assistance, Sitting/lateral leans, Sit to/from stand, Cueing for safety, Cueing for sequencing Upper Body Dressing : Min guard, Sitting, Standing Lower Body Dressing: Moderate assistance,  Cueing for safety, Cueing for sequencing, Sitting/lateral leans, Sit to/from stand Lower Body Dressing Details (indicate cue type and reason): donning shoes, but unable to tie them Toilet Transfer: Moderate assistance, Ambulation, BSC, RW Toileting- Clothing Manipulation and Hygiene: Minimal assistance, Cueing for safety, Sit to/from stand, Sitting/lateral lean Functional mobility during ADLs: Moderate assistance, Cueing for safety, Cueing for sequencing, Rolling walker General ADL Comments: Pt with decreased insight into deficits, decreased safety awareness and decreased ability to care for self safely.  Cognition: Cognition Overall Cognitive Status: Within Functional Limits for tasks assessed Orientation Level: Oriented  X4 Cognition Arousal/Alertness: Awake/alert Behavior During Therapy: WFL for tasks assessed/performed Overall Cognitive Status: Within Functional Limits for tasks assessed Area of Impairment: Safety/judgement, Problem solving, Awareness Orientation Level: Disoriented to, Time Following Commands: Follows one step commands consistently, Follows multi-step commands with increased time Safety/Judgement: Decreased awareness of safety, Decreased awareness of deficits Awareness: Emergent Problem Solving: Slow processing, Requires verbal cues, Requires tactile cues General Comments: Pt tearful during session.  Physical Exam: Blood pressure 107/81, pulse (!) 53, temperature 97.9 F (36.6 C), temperature source Oral, resp. rate 16, height 6\' 2"  (1.88 m), weight 115.5 kg, SpO2 97 %. Physical Exam  Nursing note and vitals reviewed. Constitutional: He is oriented to person, place, and time. He appears well-developed and well-nourished.  HENT:  Head: Normocephalic and atraumatic.  Eyes: Right eye exhibits no discharge. Left eye exhibits no discharge.  Limited eye contact, ?right gaze preference  Neck:  +Thyroid nodule  Respiratory: Effort normal. No respiratory distress. He has no wheezes.  GI: Soft. He exhibits no distension.  Musculoskeletal:     Comments: Left shoulder with limited ROM due to RTC pathology.  No edema  Neurological: He is alert and oriented to person, place, and time.  Mild left facial weakness with dysarthria.  Able to follow commands without difficulty.  Motor: RUE/RLE: 5/5 proximal to distal LUE/LLE: 4+/5 proximal to distal with apraxia Sensation intact to light touch  Skin: Skin is warm and dry.  Psychiatric: His affect is blunt.  Somewhat distracted    Results for orders placed or performed during the hospital encounter of 02/22/20 (from the past 48 hour(s))  Basic metabolic panel     Status: None   Collection Time: 02/27/20  3:51 AM  Result Value Ref Range    Sodium 138 135 - 145 mmol/L   Potassium 4.0 3.5 - 5.1 mmol/L   Chloride 105 98 - 111 mmol/L   CO2 24 22 - 32 mmol/L   Glucose, Bld 92 70 - 99 mg/dL    Comment: Glucose reference range applies only to samples taken after fasting for at least 8 hours.   BUN 13 8 - 23 mg/dL   Creatinine, Ser 0.99 0.61 - 1.24 mg/dL   Calcium 9.0 8.9 - 10.3 mg/dL   GFR calc non Af Amer >60 >60 mL/min   GFR calc Af Amer >60 >60 mL/min   Anion gap 9 5 - 15    Comment: Performed at [facility] [address] [place], [place] [address]   No results found.     Medical Problem List and Plan: 1.  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  -patient may shower  -ELOS/Goals: 5-9 days/Supervision/Mod I  Admit to [name] 2.  Antithrombotics: -DVT/anticoagulation:  Mechanical: Sequential compression devices, below knee Bilateral lower extremities  -antiplatelet therapy: N/a due to bleed  3. Chronic back pain/Pain Management: uses Oxymorphone 10 mg bid per Dr.  Bartko pain management--> MS contin 15 mg bid as substitute.   Monitor with increased exertion 4. Mood: LCSW to follow for evaluation and support.   -antipsychotic agents: N/A 5. Neuropsych: This patient is capable of making decisions on his own behalf. 6. Skin/Wound Care: Routine pressure relief measures.  7. Fluids/Electrolytes/Nutrition: Monitor I/O. CMP ordered.  8. HTN: Monitor qid--continue Norvasc and Avapro. Orthostatic changes today with PT--encourage fluid intake.   Monitor with increased mobility. 9.  Left thyroid nodule: Thyroid ultrasound on outpatient basis? 10. Low grade fevers: Encourage IS--resolving. Continue to monitor vital signs/labs. 11. Barrett's esophagus: On PPI but still has intermittent issues with GERD.  Bary Leriche, PA-C 02/27/2020   I have personally performed a face to face diagnostic evaluation, including, but not limited to relevant history and physical exam findings, of this  patient and developed relevant assessment and plan.  Additionally, I have reviewed and concur with the physician assistant's documentation above.  Delice Lesch, MD, ABPMR  The patient's status has not changed. The original post admission physician evaluation remains appropriate, and any changes from the pre-admission screening or documentation from the acute chart are noted above.   Delice Lesch, MD, ABPMR

## 2020-02-27 NOTE — Discharge Summary (Signed)
Physician Discharge Summary  Richard Vaughn [id number] DOB: August 12, 1951 DOA: 02/22/2020  PCP: Martin[place], [name], MD  Admit date: 02/22/2020 Discharge date: 02/27/2020  Admitted From: Home Disposition: [name]   Recommendations for Outpatient Follow-up:  1. Per [name]  Home Health: Per [name] Equipment/Devices: Per [name] Discharge Condition: Stable CODE STATUS: Full Diet recommendation: As tolerated  Brief/Interim Summary: Richard Vaughn is a 69 y.o. male with history of hypertension, hyperlipidemia, vision abnormalities, hyperglycemia, Barrett's esophagus,multiple lacunar infarcts, prior small hypertensive bleeds in bilateral deep gray matter structures with most recent ICH in the right thalamus/posterior limb of internal capsule in 2019 with mild residual left-sided weakness presenting with sudden onset of worsening dysarthria and left-sided weakness. He did not receive IV t-PA due to Abram. He was admitted to the Neurology service 4/9 and was transferred to hospitalist service on 4/14 which was also the day of discharge to [name]. See final assessment and plan per neurology below.   Stroke: acute right thalamic hemorrhage secondary to hx HTN   Resultant dysarthria and left face and extremity weakness  Code Stroke CT Head - Positive for acute right thalamic hemorrhage (6 mL) with mild surrounding edema and minimal mass effect. No intraventricular or extra-axial extension at this time. Underlying advanced chronic small vessel disease.   CTA H&N - No large vessel occlusion. Minimal atherosclerosis in the head and neck - although there is at least moderate stenosis suspected at the origin of the non-dominant left vertebral artery origin.  MRI head W & WO - 2.1 x 2.1 x 2.2 cm acute intraparenchymal hemorrhage centered at the right thalamus, unchanged from previous. Associated mild surrounding vasogenic edema and localized regional mass effect, with up to 3 mm of localized right-to-left shift. No  underlying mass lesion, abnormal enhancement, or other abnormality identified. Multiple remote lacunar infarcts involving the left basal ganglia and thalamus.   2D Echo - EF 60 - 65%. No cardiac source of emboli identified.   [employer] Virus 2 - negative  LDL - 73  HgbA1c - 5.4   VTE prophylaxis - SCDs  aspirin 81 mg daily prior to admission, now on No antithrombotic  Therapy recommendations:  [name]  Disposition:  Pending  Transfer to the floor, ok for non-tele bed   Medically ready for d/c once plan identified  Hypertension  BP 113/80 on arrival - has not been hypertensive  Home BP meds: Norvasc; Benicar  Current BP meds: Labetalol  Systolic blood pressure somewhat low at times   SBP goal < 140 mm Hg initially, can increase goal though BP remains low  Long-term BP goal normotensive  Hyperlipidemia  Home Lipid lowering medication: none  LDL 73  Statin contraindicated with ICH  Consider low dose statin at f/u given hx ischemic infarct, goal LDL < 70   Other Stroke Risk Factors  Advanced age  Former cigarette smoker  Substance Abuse hx - opiods  Obesity, Body mass index is 32.69 kg/m., recommend weight loss, diet and exercise as appropriate   Hx stroke/TIA ? 04/2018 - right thalamic/PLIC ICH likely secondary to HTN and smoking  ? 09/2016 right facial tingling, MRI no acute infarct but old BG/CR infarct, CTA head and neck neg, EF 55-60%, LDL 86 and A1C 5.4. Considered recrudescence of old stroke, on ASA and lipitor ? 07/2015 - Left BG/CR infarct in the past - no residue  Family hx stroke (father and sisters)  Other Active Problems  Code status - Full code  14 mm left thyroid  lobe nodule meets consensus criteria for dedicated Thyroid Ultrasound follow-up.  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   Bradycardia - resolved  Mild temp - 99; Resp rate - 15 (monitor) CXR prominence of B infrahilar  bronchovascular markings - ? Minimal vasc congestion vs viral bronchopneumonia. UA neg. Encourage TCDB.   Discharge Diagnoses:  Active Problems:   ICH (intracerebral hemorrhage) (HCC) - R thalmic/PLIC d/t HTN  Allergies as of 02/27/2020   No Known Allergies       No Known Allergies  Consultations:  Neurology primary.  Procedures/Studies: CT Code Stroke CTA Head W/WO contrast  Result Date: 02/22/2020 CLINICAL DATA:  69 year old male code stroke presentation including rightward gaze. Small acute hemorrhage in the right thalamus. EXAM: CT ANGIOGRAPHY HEAD AND NECK TECHNIQUE: Multidetector CT imaging of the head and neck was performed using the standard protocol during bolus administration of intravenous contrast. Multiplanar CT image reconstructions and MIPs were obtained to evaluate the vascular anatomy. Carotid stenosis measurements (when applicable) are obtained utilizing NASCET criteria, using the distal internal carotid diameter as the denominator. CONTRAST:  15mL OMNIPAQUE IOHEXOL 350 MG/ML SOLN COMPARISON:  Plain head CT earlier tonight. CTA head and neck 09/23/2016. FINDINGS: CTA NECK Skeleton: Occasional dental caries. No acute osseous abnormality identified. Upper chest: Centrilobular emphysema again noted. No superior mediastinal lymphadenopathy. Other neck: Mild chronic thyromegaly. There is a 16 mm nodule in the left thyroid lobe which meets consensus criteria for ultrasound follow-up (ref: J Am Coll Radiol. 2015 Feb;12(2): 143-50). Additionally, there is 14 mm round mildly hyperenhancing soft tissue nodule in the left parapharyngeal space inseparable from the deep lobe of the left parotid gland. This was about 12 mm in 2017 and has been visible on brain MRI since 08/02/2015 (12-13 cm at that time). No superimposed cervical lymphadenopathy and otherwise negative neck soft tissues. Aortic arch: Mild Calcified aortic atherosclerosis. 3 vessel arch configuration. Right carotid system:  Negative. Left carotid system: Negative. Vertebral arteries: Mild soft and calcified plaque near the right vertebral artery origin but no origin stenosis. The right vertebral appears dominant and is patent to the skull base without stenosis. Minimal plaque in the proximal left subclavian artery without stenosis. Non dominant left vertebral artery origin appears stenotic due to plaque on series 8, image 202, at least moderate stenosis. The left vertebral is non dominant but patent to the skull base without additional stenosis. CTA HEAD Posterior circulation: The non dominant left vertebral V4 segment is diminutive but patent to the basilar. Patent left PICA origin. Dominant right vertebral artery supplies the basilar without stenosis. Normal right PICA origin. Patent basilar artery without stenosis. Patent SCA and PCA origins. Bilateral PCA branches are stable. There is mild right P1/P2 irregularity but no significant stenosis. Anterior circulation: Both ICA siphons are patent. There is mild bilateral calcified plaque with no stenosis. Normal ophthalmic artery origins. Patent carotid termini. Patent MCA and ACA origins. Tortuous A1 segments. Normal anterior communicating artery. Bilateral ACA branches are within normal limits. Left MCA M1 segment and bifurcation are patent without stenosis. Right MCA M1 segment and bifurcation are patent without stenosis. Bilateral MCA branches appear stable since 2017, no significant stenosis. Right thalamic blood redemonstrated. No CTA spot sign identified. Venous sinuses: Patent. Anatomic variants: Dominant right vertebral artery. Review of the MIP images confirms the above findings IMPRESSION: 1. Stable arterial findings since a 2017 CTA: No large vessel occlusion. Minimal atherosclerosis in the head and neck -  although there is at least moderate stenosis suspected at the origin of the non-dominant left vertebral artery origin. 2. No CTA spot sign associated with the right  thalamic hemorrhage. 3. 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 This appears not significantly changed since a 2016 brain MRI. - a 14 mm left thyroid lobe nodule meets consensus criteria for dedicated Thyroid Ultrasound follow-up. Electronically Signed   By: Genevie Ann M.D.   On: 02/22/2020 21:46   CT Code Stroke CTA Neck W/WO contrast  Result Date: 02/22/2020 CLINICAL DATA:  69 year old male code stroke presentation including rightward gaze. Small acute hemorrhage in the right thalamus. EXAM: CT ANGIOGRAPHY HEAD AND NECK TECHNIQUE: Multidetector CT imaging of the head and neck was performed using the standard protocol during bolus administration of intravenous contrast. Multiplanar CT image reconstructions and MIPs were obtained to evaluate the vascular anatomy. Carotid stenosis measurements (when applicable) are obtained utilizing NASCET criteria, using the distal internal carotid diameter as the denominator. CONTRAST:  6mL OMNIPAQUE IOHEXOL 350 MG/ML SOLN COMPARISON:  Plain head CT earlier tonight. CTA head and neck 09/23/2016. FINDINGS: CTA NECK Skeleton: Occasional dental caries. No acute osseous abnormality identified. Upper chest: Centrilobular emphysema again noted. No superior mediastinal lymphadenopathy. Other neck: Mild chronic thyromegaly. There is a 16 mm nodule in the left thyroid lobe which meets consensus criteria for ultrasound follow-up (ref: J Am Coll Radiol. 2015 Feb;12(2): 143-50). Additionally, there is 14 mm round mildly hyperenhancing soft tissue nodule in the left parapharyngeal space inseparable from the deep lobe of the left parotid gland. This was about 12 mm in 2017 and has been visible on brain MRI since 08/02/2015 (12-13 cm at that time). No superimposed cervical lymphadenopathy and otherwise negative neck  soft tissues. Aortic arch: Mild Calcified aortic atherosclerosis. 3 vessel arch configuration. Right carotid system: Negative. Left carotid system: Negative. Vertebral arteries: Mild soft and calcified plaque near the right vertebral artery origin but no origin stenosis. The right vertebral appears dominant and is patent to the skull base without stenosis. Minimal plaque in the proximal left subclavian artery without stenosis. Non dominant left vertebral artery origin appears stenotic due to plaque on series 8, image 202, at least moderate stenosis. The left vertebral is non dominant but patent to the skull base without additional stenosis. CTA HEAD Posterior circulation: The non dominant left vertebral V4 segment is diminutive but patent to the basilar. Patent left PICA origin. Dominant right vertebral artery supplies the basilar without stenosis. Normal right PICA origin. Patent basilar artery without stenosis. Patent SCA and PCA origins. Bilateral PCA branches are stable. There is mild right P1/P2 irregularity but no significant stenosis. Anterior circulation: Both ICA siphons are patent. There is mild bilateral calcified plaque with no stenosis. Normal ophthalmic artery origins. Patent carotid termini. Patent MCA and ACA origins. Tortuous A1 segments. Normal anterior communicating artery. Bilateral ACA branches are within normal limits. Left MCA M1 segment and bifurcation are patent without stenosis. Right MCA M1 segment and bifurcation are patent without stenosis. Bilateral MCA branches appear stable since 2017, no significant stenosis. Right thalamic blood redemonstrated. No CTA spot sign identified. Venous sinuses: Patent. Anatomic variants: Dominant right vertebral artery. Review of the MIP images confirms the above findings IMPRESSION: 1. Stable arterial findings since a 2017 CTA: No large vessel occlusion. Minimal atherosclerosis  in the head and neck - although there is at least moderate stenosis suspected  at the origin of the non-dominant left vertebral artery origin. 2. No CTA spot sign associated with the right thalamic hemorrhage. 3. 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 This appears not significantly changed since a 2016 brain MRI. - a 14 mm left thyroid lobe nodule meets consensus criteria for dedicated Thyroid Ultrasound follow-up. Electronically Signed   By: Genevie Ann M.D.   On: 02/22/2020 21:46   MR BRAIN W WO CONTRAST  Result Date: 02/23/2020 CLINICAL DATA:  Follow-up examination for acute stroke. EXAM: MRI HEAD WITHOUT AND WITH CONTRAST TECHNIQUE: Multiplanar, multiecho pulse sequences of the brain and surrounding structures were obtained without and with intravenous contrast. CONTRAST:  40mL GADAVIST GADOBUTROL 1 MMOL/ML IV SOLN COMPARISON:  Prior CT and CTA from 02/22/2020 FINDINGS: Brain: Examination moderately degraded by motion artifact. Diffuse prominence of the CSF containing spaces compatible generalized age-related cerebral atrophy. Patchy and confluent T2/FLAIR hyperintensity seen involving the periventricular deep white matter both cerebral hemispheres most consistent with chronic small vessel ischemic disease, advanced in nature. Patchy involvement of the pons noted. Multiple scattered remote lacunar infarcts seen involving the left basal ganglia and thalamus. Associated chronic hemosiderin staining present within this region. Previously identified acute intraparenchymal hemorrhage centered at the right thalamus again seen, relatively stable in size and appearance measuring 2.1 x 2.1 x 2.2 cm. Mild surrounding vasogenic edema with localized regional mass effect. Mild localized right-to-left shift of up to approximately 3 mm. No visible intraventricular extension. No underlying mass lesion, abnormal enhancement, or  other abnormality seen underlying this hemorrhage. No other evidence for acute or subacute infarct. Gray-white matter differentiation otherwise maintained. No other areas of remote cortical infarction. No mass lesion. No hydrocephalus or ventricular trapping. No extra-axial fluid collection. Pituitary gland suprasellar region normal. Midline structures intact. No other abnormal enhancement. Vascular: Major intracranial vascular flow voids are maintained. FLAIR signal intensity seen involving the right transverse and sigmoid sinuses most like related to slow flow. No associated T1 hyperintensity or filling defect on postcontrast sequence to suggest dural sinus thrombosis. Skull and upper cervical spine: Craniocervical junction within normal limits. Bone marrow signal intensity normal. No scalp soft tissue abnormality. Sinuses/Orbits: Globes and orbital soft tissues within normal limits. Paranasal sinuses are clear. Left mastoid and middle ear effusion noted. Other: None. IMPRESSION: 1. 2.1 x 2.1 x 2.2 cm acute intraparenchymal hemorrhage centered at the right thalamus, unchanged from previous. Associated mild surrounding vasogenic edema and localized regional mass effect, with up to 3 mm of localized right-to-left shift. No underlying mass lesion, abnormal enhancement, or other abnormality identified. 2. Multiple remote lacunar infarcts involving the left basal ganglia and thalamus. 3. Underlying age-related cerebral atrophy with advanced chronic microvascular ischemic disease. 4. Left mastoid and middle ear effusion. Electronically Signed   By: Jeannine Boga M.D.   On: 02/23/2020 03:02   DG Chest Port 1 View  Result Date: 02/24/2020 CLINICAL DATA:  Fever. EXAM: PORTABLE CHEST 1 VIEW COMPARISON:  08/02/2015 FINDINGS: Lungs are adequately inflated with subtle prominence of the bronchovascular markings over the infrahilar regions. No focal lobar consolidation or effusion. Cardiomediastinal silhouette and  remainder of the exam is unchanged. IMPRESSION: Slight prominence of the bilateral infrahilar bronchovascular markings which may be due to  minimal vascular congestion versus viral bronchopneumonia. Electronically Signed   By: Marin Olp M.D.   On: 02/24/2020 11:25   ECHOCARDIOGRAM COMPLETE  Result Date: 02/23/2020    ECHOCARDIOGRAM REPORT   Patient Name:   Richard Vaughn Date of Exam: 02/23/2020 Medical Rec #:  [id number]      Height:       74.0 in Accession #:    [id number]     Weight:       254.6 lb Date of Birth:  22-Jul-1951     BSA:          2.408 m Patient Age:    67 years       BP:           116/85 mmHg Patient Gender: M              HR:           46 bpm. Exam Location:  Inpatient Procedure: 2D Echo, 3D Echo, Cardiac Doppler and Color Doppler Indications:    Stroke  History:        Patient has prior history of Echocardiogram examinations, most                 recent 04/16/2018. Abnormal ECG, Stroke; Risk Factors:Current                 Smoker, Dyslipidemia, Hypertension and Sleep Apnea. Marijuana                 use. ICH.  Sonographer:    Roseanna Rainbow RDCS Referring Phys: [id number] ASHISH ARORA  Sonographer Comments: Suboptimal apical window. Image acquisition challenging due to respiratory motion. Patient could not follow breathing instructions. IMPRESSIONS  1. Left ventricular ejection fraction, by estimation, is 60 to 65%. The left ventricle has normal function. The left ventricle has no regional wall motion abnormalities. There is moderate left ventricular hypertrophy of the basal-septal segment. Left ventricular diastolic parameters were normal.  2. Right ventricular systolic function is normal. The right ventricular size is normal. Tricuspid regurgitation signal is inadequate for assessing PA pressure.  3. The mitral valve is normal in structure. Trivial mitral valve regurgitation. No evidence of mitral stenosis.  4. The aortic valve was not well visualized. Aortic valve regurgitation is not visualized.  Mild aortic valve sclerosis is present, with no evidence of aortic valve stenosis.  5. The inferior vena cava is dilated in size with >50% respiratory variability, suggesting right atrial pressure of 8 mmHg. FINDINGS  Left Ventricle: Left ventricular ejection fraction, by estimation, is 60 to 65%. The left ventricle has normal function. The left ventricle has no regional wall motion abnormalities. The left ventricular internal cavity size was normal in size. There is  moderate left ventricular hypertrophy of the basal-septal segment. Left ventricular diastolic parameters were normal. Normal left ventricular filling pressure. Right Ventricle: The right ventricular size is normal. No increase in right ventricular wall thickness. Right ventricular systolic function is normal. Tricuspid regurgitation signal is inadequate for assessing PA pressure. Left Atrium: Left atrial size was normal in size. Right Atrium: Right atrial size was normal in size. Pericardium: There is no evidence of pericardial effusion. Mitral Valve: The mitral valve is normal in structure. Normal mobility of the mitral valve leaflets. Trivial mitral valve regurgitation. No evidence of mitral valve stenosis. Tricuspid Valve: The tricuspid valve is normal in structure. Tricuspid valve regurgitation is not demonstrated. No evidence of tricuspid stenosis. Aortic Valve: The aortic valve was not well visualized. Aortic  valve regurgitation is not visualized. Mild aortic valve sclerosis is present, with no evidence of aortic valve stenosis. Pulmonic Valve: The pulmonic valve was normal in structure. Pulmonic valve regurgitation is not visualized. No evidence of pulmonic stenosis. Aorta: The aortic root is normal in size and structure. Venous: The inferior vena cava is dilated in size with greater than 50% respiratory variability, suggesting right atrial pressure of 8 mmHg. IAS/Shunts: No atrial level shunt detected by color flow Doppler.  LEFT VENTRICLE PLAX  2D LVIDd:         4.80 cm      Diastology LVIDs:         3.10 cm      LV e' lateral:   11.60 cm/s LV PW:         1.30 cm      LV E/e' lateral: 6.3 LV IVS:        1.50 cm      LV e' medial:    7.07 cm/s                             LV E/e' medial:  10.4  LV Volumes (MOD) LV vol d, MOD A2C: 165.0 ml LV vol d, MOD A4C: 141.0 ml LV vol s, MOD A2C: 52.6 ml LV vol s, MOD A4C: 48.6 ml LV SV MOD A2C:     112.4 ml LV SV MOD A4C:     141.0 ml LV SV MOD BP:      105.9 ml RIGHT VENTRICLE             IVC RV S prime:     12.60 cm/s  IVC diam: 2.30 cm TAPSE (M-mode): 2.7 cm LEFT ATRIUM           Index       RIGHT ATRIUM           Index LA diam:      3.10 cm 1.29 cm/m  RA Area:     23.80 cm LA Vol (A2C): 36.4 ml 15.11 ml/m RA Volume:   68.40 ml  28.40 ml/m LA Vol (A4C): 38.7 ml 16.07 ml/m  AORTIC VALVE LVOT Vmax:   103.00 cm/s LVOT Vmean:  71.500 cm/s LVOT VTI:    0.246 m  AORTA Ao Root diam: 3.60 cm Ao Asc diam:  4.10 cm MITRAL VALVE MV Area (PHT): 3.60 cm    SHUNTS MV Decel Time: 211 msec    Systemic VTI: 0.25 m MV E velocity: 73.30 cm/s MV A velocity: 54.80 cm/s MV E/A ratio:  1.34 Fransico Him MD Electronically signed by Fransico Him MD Signature Date/Time: 02/23/2020/12:38:04 PM    Final    CT HEAD CODE STROKE WO CONTRAST  Result Date: 02/22/2020 CLINICAL DATA:  Code stroke. 69 year old male with left side weakness and slurred speech. EXAM: CT HEAD WITHOUT CONTRAST TECHNIQUE: Contiguous axial images were obtained from the base of the skull through the vertex without intravenous contrast. COMPARISON:  Head CT 06/01/2018. Brain MRI 09/22/2016. FINDINGS: Brain: Lobulated hyperdense hemorrhage centered in the right thalamus encompasses 23 x 21 by 24 mm (AP by transverse by CC). Estimated blood volume 6 mL mild surrounding edema. Mild regional mass effect. Blood products abut the right lateral ventricle but no intraventricular extension is identified. No significant intracranial mass effect. No ventriculomegaly. Superimposed  chronically advanced bilateral white matter disease and multifocal chronic lacunar infarcts in the left corona radiata and basal ganglia. No superimposed acute  cortically based infarct identified. Vascular: Mild Calcified atherosclerosis at the skull base. No suspicious intracranial vascular hyperdensity. Skull: No acute osseous abnormality identified. Sinuses/Orbits: Chronic left mastoid effusion. Left tympanic cavity and paranasal sinuses remain clear. Other: Rightward gaze. Visualized scalp soft tissues are within normal limits. ASPECTS [facility] Stroke Program Early CT Score) Total score (0-10 with 10 being normal): (Acute hemorrhage). IMPRESSION: 1. Positive for acute right thalamic hemorrhage (6 mL) with mild surrounding edema and minimal mass effect. No intraventricular or extra-axial extension at this time. 2. Underlying advanced chronic small vessel disease. 3. Study discussed by telephone with Dr. Rory Percy on 02/22/2020 at 21:14 . Electronically Signed   By: Genevie Ann M.D.   On: 02/22/2020 21:17   Subjective: No new complaints, ready to go to [name]  Discharge Exam: Vitals:   02/27/20 0429 02/27/20 0816  BP: 109/83 107/81  Pulse: (!) 55 (!) 53  Resp:  16  Temp: 98 F (36.7 C) 97.9 F (36.6 C)  SpO2:  97%   General: Pt is alert, awake, not in acute distress, pleasant Cardiovascular: RRR, S1/S2 +, no rubs, no gallops Respiratory: CTA bilaterally, no wheezing, no rhonchi Abdominal: Soft, NT, ND, bowel sounds + Extremities: No edema, no cyanosis  Labs: BNP (last 3 results) No results for input(s): BNP in the last 8760 hours. Basic Metabolic Panel: Recent Labs  Lab 02/22/20 2100 02/22/20 2113 02/24/20 0824 02/27/20 0351  NA 139 140 140 138  K 4.1 3.8 3.8 4.0  CL 109 106 108 105  CO2 22  --  23 24  GLUCOSE 98 95 101* 92  BUN 14 15 11 13   CREATININE 1.05 1.00 0.92 0.99  CALCIUM 9.0  --  9.2 9.0   Liver Function Tests: Recent Labs  Lab 02/22/20 2100  AST 20  ALT 22  ALKPHOS 73   BILITOT 0.5  PROT 6.6  ALBUMIN 3.9   No results for input(s): LIPASE, AMYLASE in the last 168 hours. No results for input(s): AMMONIA in the last 168 hours. CBC: Recent Labs  Lab 02/22/20 2100 02/22/20 2113 02/24/20 0824  WBC 6.6  --  7.3  NEUTROABS 3.7  --   --   HGB 14.0 13.6 15.0  HCT 42.3 40.0 43.9  MCV 95.1  --  91.1  PLT 148*  --  179   Cardiac Enzymes: No results for input(s): CKTOTAL, CKMB, CKMBINDEX, TROPONINI in the last 168 hours. BNP: Invalid input(s): POCBNP CBG: Recent Labs  Lab 02/22/20 2059  GLUCAP 95   D-Dimer No results for input(s): DDIMER in the last 72 hours. Hgb A1c No results for input(s): HGBA1C in the last 72 hours. Lipid Profile No results for input(s): CHOL, HDL, LDLCALC, TRIG, CHOLHDL, LDLDIRECT in the last 72 hours. Thyroid function studies No results for input(s): TSH, T4TOTAL, T3FREE, THYROIDAB in the last 72 hours.  Invalid input(s): FREET3 Anemia work up No results for input(s): VITAMINB12, FOLATE, FERRITIN, TIBC, IRON, RETICCTPCT in the last 72 hours. Urinalysis    Component Value Date/Time   COLORURINE YELLOW 02/24/2020 1830   APPEARANCEUR CLEAR 02/24/2020 1830   LABSPEC 1.023 02/24/2020 1830   PHURINE 5.0 02/24/2020 1830   GLUCOSEU NEGATIVE 02/24/2020 1830   GLUCOSEU NEGATIVE 08/07/2013 1552   HGBUR NEGATIVE 02/24/2020 1830   BILIRUBINUR NEGATIVE 02/24/2020 1830   BILIRUBINUR n 04/22/2015 1117   [place] 02/24/2020 1830   PROTEINUR NEGATIVE 02/24/2020 1830   UROBILINOGEN 1.0 08/02/2015 1521   NITRITE NEGATIVE 02/24/2020 1830   LEUKOCYTESUR NEGATIVE 02/24/2020 1830  Microbiology Recent Results (from the past 240 hour(s))  Respiratory Panel by RT PCR (Flu A&B, Covid) - Nasopharyngeal Swab     Status: None   Collection Time: 02/22/20  9:30 PM   Specimen: Nasopharyngeal Swab  Result Value Ref Range Status   SARS Coronavirus 2 by RT PCR NEGATIVE NEGATIVE Final    Comment: (NOTE) SARS-CoV-2 target nucleic  acids are NOT DETECTED. The SARS-CoV-2 RNA is generally detectable in upper respiratoy specimens during the acute phase of infection. The lowest concentration of SARS-CoV-2 viral copies this assay can detect is 131 copies/mL. A negative result does not preclude SARS-Cov-2 infection and should not be used as the sole basis for treatment or other patient management decisions. A negative result may occur with  improper specimen collection/handling, submission of specimen other than nasopharyngeal swab, presence of viral mutation(s) within the areas targeted by this assay, and inadequate number of viral copies (<131 copies/mL). A negative result must be combined with clinical observations, patient history, and epidemiological information. The expected result is Negative. Fact Sheet for Patients:  PinkCheek.be Fact Sheet for Healthcare Providers:  GravelBags.it This test is not yet ap proved or cleared by the [place] FDA and  has been authorized for detection and/or diagnosis of SARS-CoV-2 by FDA under an Emergency Use Authorization (EUA). This EUA will remain  in effect (meaning this test can be used) for the duration of the COVID-19 declaration under Section 564(b)(1) of the Act, 21 U.S.C. section 360bbb-3(b)(1), unless the authorization is terminated or revoked sooner.    Influenza A by PCR NEGATIVE NEGATIVE Final   Influenza B by PCR NEGATIVE NEGATIVE Final    Comment: (NOTE) The Xpert Xpress SARS-CoV-2/FLU/RSV assay is intended as an aid in  the diagnosis of influenza from Nasopharyngeal swab specimens and  should not be used as a sole basis for treatment. Nasal washings and  aspirates are unacceptable for Xpert Xpress SARS-CoV-2/FLU/RSV  testing. Fact Sheet for Patients: PinkCheek.be Fact Sheet for Healthcare Providers: GravelBags.it This test is not yet  approved or cleared by the [place] FDA and  has been authorized for detection and/or diagnosis of SARS-CoV-2 by  FDA under an Emergency Use Authorization (EUA). This EUA will remain  in effect (meaning this test can be used) for the duration of the  Covid-19 declaration under Section 564(b)(1) of the Act, 21  U.S.C. section 360bbb-3(b)(1), unless the authorization is  terminated or revoked. Performed at [facility], [place] [address], [place], [place] [address]   MRSA PCR Screening     Status: None   Collection Time: 02/22/20 10:28 PM   Specimen: Nasal Mucosa; Nasopharyngeal  Result Value Ref Range Status   MRSA by PCR NEGATIVE NEGATIVE Final    Comment:        The GeneXpert MRSA Assay (FDA approved for NASAL specimens only), is one component of a comprehensive MRSA colonization surveillance program. It is not intended to diagnose MRSA infection nor to guide or monitor treatment for MRSA infections. Performed at [facility], [place] [address] [place], [place] [address]     Time coordinating discharge: Approximately 40 minutes  Patrecia Pour, MD  [facility] 02/27/2020, 10:23 AM

## 2020-02-27 NOTE — Progress Notes (Signed)
Inpatient Rehabilitation Medication Review by a Pharmacist  A complete drug regimen review was completed for this patient to identify any potential clinically significant medication issues.  Clinically significant medication issues were identified:  No  Pharmacist comments: No issues   Time spent performing this drug regimen review (minutes):  10 minutes   Cristela Felt, PharmD PGY1 Pharmacy Resident  02/27/2020 5:26 PM

## 2020-02-27 NOTE — Progress Notes (Signed)
STROKE TEAM PROGRESS NOTE   INTERVAL HISTORY Patient is stable.  Sitting up in a bedside chair.  He was able to ambulate with physical therapy but is ataxic and off balance.  He is awaiting transfer to rehab today as bed is available  .  Vital signs are stable.  No new complaints..D/W Dr Bonner Puna  OBJECTIVE Vitals:   02/26/20 2100 02/27/20 0020 02/27/20 0429 02/27/20 0816  BP:  111/86 109/83 107/81  Pulse:  60 (!) 55 (!) 53  Resp:  18  16  Temp:  98.2 F (36.8 C) 98 F (36.7 C) 97.9 F (36.6 C)  TempSrc:  Oral Oral Oral  SpO2: 100% 97%  97%  Weight:      Height:       CBC:  Recent Labs  Lab 02/22/20 2100 02/22/20 2100 02/22/20 2113 02/24/20 0824  WBC 6.6  --   --  7.3  NEUTROABS 3.7  --   --   --   HGB 14.0   < > 13.6 15.0  HCT 42.3   < > 40.0 43.9  MCV 95.1  --   --  91.1  PLT 148*  --   --  179   < > = values in this interval not displayed.   Basic Metabolic Panel:  Recent Labs  Lab 02/24/20 0824 02/27/20 0351  NA 140 138  K 3.8 4.0  CL 108 105  CO2 23 24  GLUCOSE 101* 92  BUN 11 13  CREATININE 0.92 0.99  CALCIUM 9.2 9.0   Lipid Panel:     Component Value Date/Time   CHOL 125 02/22/2020 2100   CHOL 142 05/15/2018 1329   TRIG 86 02/22/2020 2100   TRIG 121 11/01/2006 1056   HDL 35 (L) 02/22/2020 2100   HDL 30 (L) 05/15/2018 1329   CHOLHDL 3.6 02/22/2020 2100   VLDL 17 02/22/2020 2100   LDLCALC 73 02/22/2020 2100   LDLCALC 87 05/15/2018 1329   HgbA1c:  Lab Results  Component Value Date   HGBA1C 5.4 02/22/2020   Urine Drug Screen:     Component Value Date/Time   LABOPIA NONE DETECTED 04/15/2018 1630   COCAINSCRNUR NONE DETECTED 04/15/2018 1630   LABBENZ NONE DETECTED 04/15/2018 1630   AMPHETMU NONE DETECTED 04/15/2018 1630   THCU POSITIVE (A) 04/15/2018 1630   LABBARB NONE DETECTED 04/15/2018 1630    Alcohol Level     Component Value Date/Time   ETH <10 04/15/2018 0127    IMAGING  CT Code Stroke CTA Head W/WO contrast CT Code Stroke  CTA Neck W/WO contrast 02/22/2020 IMPRESSION:  1. Stable arterial findings since a 2017 CTA: No large vessel occlusion. Minimal atherosclerosis in the head and neck - although there is at least moderate stenosis suspected at the origin of the non-dominant left vertebral artery origin.  2. No CTA spot sign associated with the right thalamic hemorrhage.  3. 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 This appears not significantly changed since a 2016 brain MRI. - a 14 mm left thyroid lobe nodule meets consensus criteria for dedicated Thyroid Ultrasound follow-up.   MR BRAIN W WO CONTRAST 02/23/2020 IMPRESSION:  1. 2.1 x 2.1 x 2.2 cm acute intraparenchymal hemorrhage centered at the right thalamus, unchanged from previous. Associated mild surrounding vasogenic edema and localized regional  mass effect, with up to 3 mm of localized right-to-left shift. No underlying mass lesion, abnormal enhancement, or other abnormality identified.  2. Multiple remote lacunar infarcts involving the left basal ganglia and thalamus.  3. Underlying age-related cerebral atrophy with advanced chronic microvascular ischemic disease.  4. Left mastoid and middle ear effusion.   CT HEAD CODE STROKE WO CONTRAST 02/22/2020 IMPRESSION:  1. Positive for acute right thalamic hemorrhage (6 mL) with mild surrounding edema and minimal mass effect. No intraventricular or extra-axial extension at this time.  2. Underlying advanced chronic small vessel disease.   Transthoracic Echocardiogram  02/23/2020 IMPRESSIONS  1. Left ventricular ejection fraction, by estimation, is 60 to 65%. The  left ventricle has normal function. The left ventricle has no regional wall motion abnormalities. There is moderate left ventricular hypertrophy of the basal-septal segment. Left   ventricular diastolic parameters were normal.  2. Right ventricular systolic function is normal. The right ventricular size is normal. Tricuspid regurgitation signal is inadequate for assessing PA pressure.  3. The mitral valve is normal in structure. Trivial mitral valve regurgitation. No evidence of mitral stenosis.  4. The aortic valve was not well visualized. Aortic valve regurgitation is not visualized. Mild aortic valve sclerosis is present, with no evidence of aortic valve stenosis.  5. The inferior vena cava is dilated in size with >50% respiratory variability, suggesting right atrial pressure of 8 mmHg.   ECG - SR rate 64 BPM. (See cardiology reading for complete details)   PHYSICAL EXAM    Blood pressure 107/81, pulse (!) 53, temperature 97.9 F (36.6 C), temperature source Oral, resp. rate 16, height 6\' 2"  (1.88 m), weight 115.5 kg, SpO2 97 %. Pleasant middle-aged African-American male not in distress. Afebrile. Head is nontraumatic. Neck is supple without bruit.    Cardiac exam no murmur or gallop. Lungs are clear to auscultation. Distal pulses are well felt. Neurological Exam : Patient is awake and interactive and follows commands well.  Mild dysarthria.  Extraocular movements are full range without nystagmus.  Blinks to threat bilaterally.  Left lower facial weakness.  Tongue midline.  Motor system exam shows no upper or lower extremity drift but has mild weakness of left grip intrinsic hand muscles.  Mild left finger-to-nose and knee to heel dysmetria.  Slight decrease sensation in the left face and arm.  Reflexes are symmetric.  Plantars are downgoing.  Gait not tested.   ASSESSMENT/PLAN Richard Vaughn is a 69 y.o. 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 He did not receive IV t-PA due to [place].   Stroke: acute right thalamic hemorrhage secondary to hx HTN   Resultant dysarthria and left face and extremity weakness  Code Stroke CT Head - Positive for acute right thalamic hemorrhage (6 mL) with mild surrounding edema and minimal mass effect. No intraventricular or extra-axial extension at this time. Underlying advanced chronic small vessel disease.   CTA H&N - No large vessel occlusion. Minimal atherosclerosis in the head and neck - although there is at least moderate stenosis suspected at the origin of the non-dominant left vertebral artery origin.  MRI head W & WO - 2.1 x 2.1 x 2.2 cm acute intraparenchymal hemorrhage centered at the right thalamus,  unchanged from previous. Associated mild surrounding vasogenic edema and localized regional mass effect, with up to 3 mm of localized right-to-left shift. No underlying mass lesion, abnormal enhancement, or other abnormality identified. Multiple remote lacunar infarcts involving the left basal ganglia and thalamus.   2D Echo - EF 60 - 65%. No cardiac source of emboli identified.   [employer] Virus 2 - negative  LDL - 73  HgbA1c - 5.4   VTE prophylaxis - SCDs  aspirin 81 mg daily prior to admission, now on No antithrombotic  Therapy recommendations:  [name]  Disposition:  Pending  Transfer to the floor, ok for non-tele bed   Medically ready for d/c once plan identified  Hypertension  BP 113/80 on arrival - has not been hypertensive  Home BP meds: Norvasc; Benicar  Current BP meds: Labetalol  Systolic blood pressure somewhat low at times  . SBP goal < 140 mm Hg initially, can increase goal though BP remains low . Long-term BP goal normotensive  Hyperlipidemia  Home Lipid lowering medication: none  LDL 73  Statin contraindicated  with ICH  Consider low dose statin at f/u given hx ischemic infarct, goal LDL < 70   Other Stroke Risk Factors  Advanced age  Former cigarette smoker  Substance Abuse hx - opiods  Obesity, Body mass index is 32.69 kg/m., recommend weight loss, diet and exercise as appropriate   Hx stroke/TIA  04/2018 - right thalamic/PLIC ICH likely secondary to HTN and smoking   09/2016 right facial tingling, MRI no acute infarct but old BG/CR infarct, CTA head and neck neg, EF 55-60%, LDL 86 and A1C 5.4. Considered recrudescence of old stroke, on ASA and lipitor  07/2015 - Left BG/CR infarct in the past - no residue  Family hx stroke (father and sisters)  Other Active Problems  Code status - Full code  14 mm left thyroid lobe nodule meets consensus criteria for dedicated Thyroid Ultrasound follow-up.  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   Bradycardia - resolved  Mild temp - 99; Resp rate - 15 (monitor) CXR prominence of B infrahilar bronchovascular markings - ? Minimal vasc congestion vs viral bronchopneumonia. UA neg. Encourage TCDB.   Hospital day # 5    Transfer to  rehab after insurance approval and bed availability today. Stroke team will sign off,  D/w Dr Genella Mech, MD  To contact Stroke Continuity provider, please refer to [email or web address] After hours, contact General Neurology

## 2020-02-27 NOTE — Progress Notes (Signed)
Inpatient Rehabilitation Admissions Coordinator  [name] bed is available to admit for this patient today. I met with patient at bedside and he is in agreement. I contacted. Bonner Puna, RN CM, Vida Roller and SW, Kathlee Nations. I will make the arrangements to admit today.  Danne Baxter, RN, MSN Rehab Admissions Coordinator [phone] 02/27/2020 10:44 AM

## 2020-02-27 NOTE — TOC Transition Note (Signed)
Transition of Care [facility]) - CM/SW Discharge Note   Patient Details  Name: Richard Vaughn MRN: [id number] Date of Birth: 10/02/1951  [facility] CM/SW Contact:  Pollie Friar, RN Phone Number: 02/27/2020, 10:17 AM   Clinical Narrative:    Pt discharging to [name] today. CM signing off.    Final next level of care: IP Rehab Facility Barriers to Discharge: No Barriers Identified   Patient Goals and CMS Choice        Discharge Placement                       Discharge Plan and Services                                     Social Determinants of Health (SDOH) Interventions     Readmission Risk Interventions No flowsheet data found.

## 2020-02-27 NOTE — H&P (Signed)
Physical Medicine and Rehabilitation Admission H&P    Chief Complaint  Patient presents with  . ICH with functional deficits.     HPI: Richard Vaughn is a 69 year old male with history of Barrett's esophagus with GERD/ hx of esophageal stricture, HTN, chronic pain--on opana, hypertensive bleeds--last 04/2018, multiple lacunar bleeds, OSA who was admitted on 02/22/2020 with headache, visual changes, left hemiparesis, and slurred speech. CT head showed acute right thalamic hemorrhage with mild surrounding edema and min mas effect as well as advanced chronic small vessel disease. CTA head/neck negative for LVO and 14 mm left thyroid nodule noted with recommendations for dedicated thyroid ultrasound.  Echocardiogram with EF of 60-65% with no wall abnormality and moderate LVH. 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 Dr. 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 Hospital course further complicated by low grade fever on 4/12, ? due to viral bronchopneumonia. Therapy ongoing and patient limited by LLE weakness with ataxia, dysarthria and LUE weakness affecting ADLs. [name] recommended due to functional decline. Please see preadmission assessment from earlier today as well.    Review of Systems  Constitutional: Negative for fever.  HENT: Negative for hearing loss and tinnitus.   Eyes: Negative for blurred vision and double vision.  Respiratory: Negative for cough and shortness of breath.   Cardiovascular: Negative for chest pain.  Gastrointestinal: Positive for heartburn. Negative for abdominal pain, constipation and nausea.  Genitourinary: Negative for dysuria and urgency.  Musculoskeletal: Positive for back pain and myalgias.  Skin: Negative for itching and rash.  Neurological: Positive for dizziness and focal weakness.  Negative for sensory change.  Psychiatric/Behavioral: The patient has insomnia. The patient is not nervous/anxious.    Past Medical History:  Diagnosis Date  . Barrett's esophagus   . Chronic back pain   . Gastritis    Mild  . Gastroparesis   . GERD (gastroesophageal reflux disease)   . Headache   . Hyperglycemia   . Hypertension   . Nephrolithiasis   . Renal cyst   . Stricture and stenosis of esophagus   . Stroke ([place]) 07-2015  . Vision abnormalities     Past Surgical History:  Procedure Laterality Date  . [place] SURGERY  2012  . COLONOSCOPY    . [place] SURGERY  2005, 2010   replaced L4 and L5  . UPPER GASTROINTESTINAL ENDOSCOPY      Family History  Problem Relation Age of Onset  . Hypertension Father   . Stroke Father   . Hypertension Mother   . Liver cancer Mother   . Hypertension Sister        2 other sisters has also  . Stroke Sister   . Stroke Sister   . Hypertension Brother        2nd brother has also  . Colon cancer Neg Hx   . Esophageal cancer Neg Hx   . Rectal cancer Neg Hx   . Stomach cancer Neg Hx   . Colon polyps Neg Hx     Social History:  Married. Independent PTA. Retired from [employer]. Active walks 2/5 hrs 3 days week and does water aerobics twice week--loves to garden. Wife supportive.  He  reports that he has been smoking about 1/2 PPD.  His smoking use included cigarettes. He  has a 15.00 pack-year smoking history. He has never used smokeless tobacco. He reports current drug use. Drugs: Opana. He reports that he does not drink alcohol.    Allergies: No Known Allergies    Facility-Administered Medications Prior to Admission  Medication Dose Route Frequency Provider Last Rate Last Admin  . 0.9 %  sodium chloride infusion  500 mL Intravenous Continuous Armbruster, Carlota Raspberry, MD       Medications Prior to Admission  Medication Sig Dispense Refill  . amLODipine (NORVASC) 2.5 MG tablet Take 1 tablet (2.5 mg total) by mouth  daily. 90 tablet 2  . aspirin 81 MG tablet Take 1 tablet (81 mg total) by mouth daily. 30 tablet   . esomeprazole (NEXIUM) 40 MG capsule Take 1 capsule (40 mg total) by mouth 2 (two) times daily before a meal. Please schedule a yearly follow up for any further refills.  Last seen 01-2019. (Patient taking differently: Take 40 mg by mouth 2 (two) times daily before a meal. ) 60 capsule 0  . naproxen (NAPROSYN) 500 MG tablet Take 500 mg by mouth 2 (two) times daily.    Marland Kitchen olmesartan (BENICAR) 20 MG tablet Take 1 tablet (20 mg total) by mouth daily. 90 tablet 2  . oxymorphone (OPANA) 10 MG tablet Take 10 mg by mouth in the morning and at bedtime.     . traZODone (DESYREL) 50 MG tablet TAKE 1 TABLET AT BEDTIME   NEEDS FOLLOW-UP APPOINTMENTBEFORE MORE REFILLS ARE    AUTHORIZED (Patient taking differently: Take 50 mg by mouth at bedtime. ) 90 tablet 2    Drug Regimen Review  Drug regimen was reviewed and remains appropriate with no significant issues identified  Home: Home Living Family/patient expects to be discharged to:: Private residence Living Arrangements: Spouse/significant other Available Help at Discharge: Family, Available 24 hours/day Type of Home: House Home Access: Stairs to enter Technical brewer of Steps: 3 Entrance Stairs-Rails: Right Home Layout: One level Bathroom Shower/Tub: Multimedia programmer: [place]: Environmental consultant - 2 wheels, Cane - single point, Bedside commode   Functional History: Prior Function Level of Independence: Independent Comments: pt reports he walks as his hobby  Functional Status:  Mobility: Bed Mobility Overal bed mobility: Needs Assistance Bed Mobility: Supine to Sit Supine to sit: HOB elevated, Min assist General bed mobility comments: Min A needed to get LLE to EOB (stuck btw sheet and end plate). Transfers Overall transfer level: Needs assistance Equipment used: None Transfers: Sit to/from Stand, Stand Pivot  Transfers Sit to Stand: Mod assist Stand pivot transfers: Mod assist General transfer comment: Assist to power to standing wtih cues to push through BUEs/LEs for equal weight distribution for all transitions. Stood from [employer], from chair x2. Ambulation/Gait Ambulation/Gait assistance: Min assist Gait Distance (Feet): 60 Feet(+75') Assistive device: 1 person hand held assist(vs rail) Gait Pattern/deviations: Decreased step length - left, Decreased dorsiflexion - left, Step-through pattern, Narrow base of support General Gait Details: Slow, unsteady gait leaving with difficulty clearing LLE especially when fatigued; used rail in hallway for support progressing to HHA. 1 seated rest break. Gait velocity: reduced Gait velocity interpretation: <1.8 ft/sec, indicate of risk for recurrent falls    ADL: ADL Overall ADL's : Needs assistance/impaired Eating/Feeding: Set up, Sitting Grooming: Min guard, Standing Upper Body Bathing: Min guard, Standing Lower Body Bathing: Moderate assistance, Sitting/lateral leans, Sit to/from stand, Cueing for safety, Cueing for sequencing Upper Body Dressing : Min guard, Sitting, Standing Lower Body Dressing: Moderate assistance,  Cueing for safety, Cueing for sequencing, Sitting/lateral leans, Sit to/from stand Lower Body Dressing Details (indicate cue type and reason): donning shoes, but unable to tie them Toilet Transfer: Moderate assistance, Ambulation, BSC, RW Toileting- Clothing Manipulation and Hygiene: Minimal assistance, Cueing for safety, Sit to/from stand, Sitting/lateral lean Functional mobility during ADLs: Moderate assistance, Cueing for safety, Cueing for sequencing, Rolling walker General ADL Comments: Pt with decreased insight into deficits, decreased safety awareness and decreased ability to care for self safely.  Cognition: Cognition Overall Cognitive Status: Within Functional Limits for tasks assessed Orientation Level: Oriented  X4 Cognition Arousal/Alertness: Awake/alert Behavior During Therapy: WFL for tasks assessed/performed Overall Cognitive Status: Within Functional Limits for tasks assessed Area of Impairment: Safety/judgement, Problem solving, Awareness Orientation Level: Disoriented to, Time Following Commands: Follows one step commands consistently, Follows multi-step commands with increased time Safety/Judgement: Decreased awareness of safety, Decreased awareness of deficits Awareness: Emergent Problem Solving: Slow processing, Requires verbal cues, Requires tactile cues General Comments: Pt tearful during session.  Physical Exam: Blood pressure 107/81, pulse (!) 53, temperature 97.9 F (36.6 C), temperature source Oral, resp. rate 16, height 6\' 2"  (1.88 m), weight 115.5 kg, SpO2 97 %. Physical Exam  Nursing note and vitals reviewed. Constitutional: He is oriented to person, place, and time. He appears well-developed and well-nourished.  HENT:  Head: Normocephalic and atraumatic.  Eyes: Right eye exhibits no discharge. Left eye exhibits no discharge.  Limited eye contact, ?right gaze preference  Neck:  +Thyroid nodule  Respiratory: Effort normal. No respiratory distress. He has no wheezes.  GI: Soft. He exhibits no distension.  Musculoskeletal:     Comments: Left shoulder with limited ROM due to RTC pathology.  No edema  Neurological: He is alert and oriented to person, place, and time.  Mild left facial weakness with dysarthria.  Able to follow commands without difficulty.  Motor: RUE/RLE: 5/5 proximal to distal LUE/LLE: 4+/5 proximal to distal with apraxia Sensation intact to light touch  Skin: Skin is warm and dry.  Psychiatric: His affect is blunt.  Somewhat distracted    Results for orders placed or performed during the hospital encounter of 02/22/20 (from the past 48 hour(s))  Basic metabolic panel     Status: None   Collection Time: 02/27/20  3:51 AM  Result Value Ref Range    Sodium 138 135 - 145 mmol/L   Potassium 4.0 3.5 - 5.1 mmol/L   Chloride 105 98 - 111 mmol/L   CO2 24 22 - 32 mmol/L   Glucose, Bld 92 70 - 99 mg/dL    Comment: Glucose reference range applies only to samples taken after fasting for at least 8 hours.   BUN 13 8 - 23 mg/dL   Creatinine, Ser 0.99 0.61 - 1.24 mg/dL   Calcium 9.0 8.9 - 10.3 mg/dL   GFR calc non Af Amer >60 >60 mL/min   GFR calc Af Amer >60 >60 mL/min   Anion gap 9 5 - 15    Comment: Performed at [facility] [address] [place], [place] [address]   No results found.     Medical Problem List and Plan: 1.  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  -patient may shower  -ELOS/Goals: 5-9 days/Supervision/Mod I  Admit to [name] 2.  Antithrombotics: -DVT/anticoagulation:  Mechanical: Sequential compression devices, below knee Bilateral lower extremities  -antiplatelet therapy: N/a due to bleed  3. Chronic back pain/Pain Management: uses Oxymorphone 10 mg bid per Dr.  Bartko pain management--> MS contin 15 mg bid as substitute.   Monitor with increased exertion 4. Mood: LCSW to follow for evaluation and support.   -antipsychotic agents: N/A 5. Neuropsych: This patient is capable of making decisions on his own behalf. 6. Skin/Wound Care: Routine pressure relief measures.  7. Fluids/Electrolytes/Nutrition: Monitor I/O. CMP ordered.  8. HTN: Monitor qid--continue Norvasc and Avapro. Orthostatic changes today with PT--encourage fluid intake.   Monitor with increased mobility. 9.  Left thyroid nodule: Thyroid ultrasound on outpatient basis? 10. Low grade fevers: Encourage IS--resolving. Continue to monitor vital signs/labs. 11. Barrett's esophagus: On PPI but still has intermittent issues with GERD.  Bary Leriche, PA-C 02/27/2020   I have personally performed a face to face diagnostic evaluation, including, but not limited to relevant history and physical exam findings, of this  patient and developed relevant assessment and plan.  Additionally, I have reviewed and concur with the physician assistant's documentation above.  Delice Lesch, MD, ABPMR

## 2020-02-27 NOTE — Progress Notes (Signed)
Meredith Staggers, MD  Physician  Physical Medicine and Rehabilitation  Consult Note     Signed  Date of Service:  02/25/2020  5:21 AM      Related encounter: ED to Hosp-Admission (Discharged) from 02/22/2020 in [facility]      Signed      Expand AllCollapse All   Show:Clear all [x] Manual[x] Template[] Copied  Added by: [x] Angiulli, Lavon Paganini, PA-C[x] Meredith Staggers, MD  [] Hover for details          Physical Medicine and Rehabilitation Consult Reason for Consult: Left side weakness and slurred speech Referring Physician: Dr. Leonie Man     HPI: Richard Vaughn is a 69 y.o. 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  Per chart review patient lives with spouse.  1 level home 3 steps to entry.  Reportedly independent prior to admission.  Presented 02/22/2020 with increasing left-sided weakness and slurred speech.  Cranial CT scan positive for acute right thalamic hemorrhage with mild surrounding edema with minimal mass-effect.  CT angiogram head and neck showed no large vessel occlusion there was an incidental finding of a small chronic 14 mm left thyroid nodule.  Patient did not receive TPA.  I 2.1 x 2.1 x 2.2 cm acute intraparenchymal hemorrhage centered at the right thalamus.  Multiple remote lacunar infarcts involving the left basal ganglia and thalamus.  Echocardiogram with ejection fraction of 65%.  Neurology services consulted maintained on Cleviprex for blood pressure control.  Tolerating a regular diet.  Therapy evaluations completed with recommendations of physical medicine rehab consult.     Review of Systems    Constitutional: Negative for chills and fever.  HENT: Negative for hearing loss.   Eyes: Negative for blurred vision and double vision.  Respiratory: Negative for cough and shortness of breath.   Cardiovascular: Negative for chest pain, palpitations and leg swelling.  Gastrointestinal: Positive for constipation. Negative for heartburn, nausea and vomiting.  Genitourinary: Negative for dysuria and hematuria.  Musculoskeletal: Positive for back pain and myalgias.  Skin: Negative for rash.  Neurological: Positive for sensory change, speech change, weakness and headaches.  All other systems reviewed and are negative.       Past Medical History:  Diagnosis Date  . Barrett's esophagus    . Chronic back pain    . Gastritis      Mild  . Gastroparesis    . GERD (gastroesophageal reflux disease)    . Headache    . Hyperglycemia    . Hypertension    . Nephrolithiasis    . Renal cyst    . Stricture and stenosis of esophagus    . Stroke ([place]) 07-2015  . Vision abnormalities           Past Surgical History:  Procedure Laterality Date  . [place] SURGERY   2012  . COLONOSCOPY      . [place] SURGERY   2005, 2010    replaced L4 and L5  . UPPER GASTROINTESTINAL ENDOSCOPY             Family History  Problem Relation Age of Onset  . Hypertension Father    . Stroke Father    . Hypertension Mother    .  Liver cancer Mother    . Hypertension Sister          2 other sisters has also  . Stroke Sister    . Stroke Sister    . Hypertension Brother          2nd brother has also  . Colon cancer Neg Hx    . Esophageal cancer Neg Hx    . Rectal cancer Neg Hx    . Stomach cancer Neg Hx    . Colon polyps Neg Hx      Social History:  reports that he quit smoking about 22 months ago. His smoking use included cigarettes. He has a 15.00 pack-year smoking history. He has never used smokeless tobacco. He reports current drug use. Drugs: Oxycodone and Morphine. He reports that he does not  drink alcohol. Allergies: No Known Allergies          Facility-Administered Medications Prior to Admission  Medication Dose Route Frequency Provider Last Rate Last Admin  . 0.9 %  sodium chloride infusion  500 mL Intravenous Continuous Armbruster, Carlota Raspberry, MD              Medications Prior to Admission  Medication Sig Dispense Refill  . amLODipine (NORVASC) 2.5 MG tablet Take 1 tablet (2.5 mg total) by mouth daily. 90 tablet 2  . aspirin 81 MG tablet Take 1 tablet (81 mg total) by mouth daily. 30 tablet    . esomeprazole (NEXIUM) 40 MG capsule Take 1 capsule (40 mg total) by mouth 2 (two) times daily before a meal. Please schedule a yearly follow up for any further refills.  Last seen 01-2019. (Patient taking differently: Take 40 mg by mouth 2 (two) times daily before a meal. ) 60 capsule 0  . naproxen (NAPROSYN) 500 MG tablet Take 500 mg by mouth 2 (two) times daily.      Marland Kitchen olmesartan (BENICAR) 20 MG tablet Take 1 tablet (20 mg total) by mouth daily. 90 tablet 2  . oxymorphone (OPANA) 10 MG tablet Take 10 mg by mouth in the morning and at bedtime.       . traZODone (DESYREL) 50 MG tablet TAKE 1 TABLET AT BEDTIME   NEEDS FOLLOW-UP APPOINTMENTBEFORE MORE REFILLS ARE    AUTHORIZED (Patient taking differently: Take 50 mg by mouth at bedtime. ) 90 tablet 2      Home: Home Living Family/patient expects to be discharged to:: Private residence Living Arrangements: Spouse/significant other Available Help at Discharge: Family, Available 24 hours/day Type of Home: House Home Access: Stairs to enter Technical brewer of Steps: 3 Entrance Stairs-Rails: Right Home Layout: One level Bathroom Shower/Tub: Multimedia programmer: [place]: Environmental consultant - 2 wheels, [place] - single point, Bedside commode  Functional History: Prior Function Level of Independence: Independent Comments: pt reports he walks as his hobby Functional Status:  Mobility: Bed Mobility Overal bed  mobility: Needs Assistance Bed Mobility: Supine to Sit Supine to sit: Supervision Transfers Overall transfer level: Needs assistance Equipment used: Rolling walker (2 wheeled) Transfers: Sit to/from Stand Sit to Stand: Mod assist, From elevated surface General transfer comment: modA for power up today; L side weak Ambulation/Gait Ambulation/Gait assistance: Mod assist Gait Distance (Feet): 15 Feet Assistive device: Rolling walker (2 wheeled) Gait Pattern/deviations: Step-to pattern, Decreased step length - left, Decreased dorsiflexion - left, Staggering left General Gait Details: pt with foot drag of LLE, often leaving left foot behind and with impaired ability to clear LLE. Pt experiences one significant  LOB requiring assistance to control descent onto bed when L foot was crossed behind R foot during gait. Pt also requires cueing for gait sequencing and RW management during session Gait velocity: reduced Gait velocity interpretation: <1.8 ft/sec, indicate of risk for recurrent falls   ADL: ADL Overall ADL's : Needs assistance/impaired Eating/Feeding: Set up, Sitting Grooming: Min guard, Standing Upper Body Bathing: Min guard, Standing Lower Body Bathing: Moderate assistance, Sitting/lateral leans, Sit to/from stand, Cueing for safety, Cueing for sequencing Upper Body Dressing : Min guard, Sitting, Standing Lower Body Dressing: Moderate assistance, Cueing for safety, Cueing for sequencing, Sitting/lateral leans, Sit to/from stand Lower Body Dressing Details (indicate cue type and reason): donning shoes, but unable to tie them Toilet Transfer: Moderate assistance, Ambulation, BSC, RW Toileting- Clothing Manipulation and Hygiene: Minimal assistance, Cueing for safety, Sit to/from stand, Sitting/lateral lean Functional mobility during ADLs: Moderate assistance, Cueing for safety, Cueing for sequencing, Rolling walker General ADL Comments: Pt with decreased insight into deficits, decreased  safety awareness and decreased ability to care for self safely.   Cognition: Cognition Overall Cognitive Status: Impaired/Different from baseline Orientation Level: Oriented X4 Cognition Arousal/Alertness: Awake/alert Behavior During Therapy: WFL for tasks assessed/performed, Flat affect Overall Cognitive Status: Impaired/Different from baseline Area of Impairment: Safety/judgement, Problem solving, Awareness Orientation Level: Disoriented to, Time Following Commands: Follows one step commands consistently, Follows multi-step commands with increased time Safety/Judgement: Decreased awareness of safety, Decreased awareness of deficits Awareness: Emergent Problem Solving: Slow processing, Requires verbal cues, Requires tactile cues General Comments: Pt A/O x4 today. Pt requiring cues for processing and unaware of worsening deficits that family comfirmed.   Blood pressure 120/76, pulse (!) 48, temperature 98 F (36.7 C), temperature source Oral, resp. rate 11, height 6\' 2"  (1.88 m), weight 115.5 kg, SpO2 95 %. Physical Exam  Neurological:  Patient is alert in no acute distress.  Makes good eye contact with examiner and follows commands.  Oriented to person and place.  Appears to have fair awareness of deficits. Left central VII, left facial/LUE sensory loss. Left hemiparesis      Lab Results Last 24 Hours       Results for orders placed or performed during the hospital encounter of 02/22/20 (from the past 24 hour(s))  Basic metabolic panel     Status: Abnormal    Collection Time: 02/24/20  8:24 AM  Result Value Ref Range    Sodium 140 135 - 145 mmol/L    Potassium 3.8 3.5 - 5.1 mmol/L    Chloride 108 98 - 111 mmol/L    CO2 23 22 - 32 mmol/L    Glucose, Bld 101 (H) 70 - 99 mg/dL    BUN 11 8 - 23 mg/dL    Creatinine, Ser 0.92 0.61 - 1.24 mg/dL    Calcium 9.2 8.9 - 10.3 mg/dL    GFR calc non Af Amer >60 >60 mL/min    GFR calc Af Amer >60 >60 mL/min    Anion gap 9 5 - 15  CBC      Status: None    Collection Time: 02/24/20  8:24 AM  Result Value Ref Range    WBC 7.3 4.0 - 10.5 K/uL    RBC 4.82 4.22 - 5.81 MIL/uL    Hemoglobin 15.0 13.0 - 17.0 g/dL    HCT 43.9 39.0 - 52.0 %    MCV 91.1 80.0 - 100.0 fL    MCH 31.1 26.0 - 34.0 pg    MCHC 34.2 30.0 - 36.0 g/dL  RDW 12.2 11.5 - 15.5 %    Platelets 179 150 - 400 K/uL    nRBC 0.0 0.0 - 0.2 %  Urinalysis, Complete w Microscopic     Status: None    Collection Time: 02/24/20  6:30 PM  Result Value Ref Range    Color, Urine YELLOW YELLOW    APPearance CLEAR CLEAR    Specific Gravity, Urine 1.023 1.005 - 1.030    pH 5.0 5.0 - 8.0    Glucose, UA NEGATIVE NEGATIVE mg/dL    Hgb urine dipstick NEGATIVE NEGATIVE    Bilirubin Urine NEGATIVE NEGATIVE    Ketones, ur NEGATIVE NEGATIVE mg/dL    Protein, ur NEGATIVE NEGATIVE mg/dL    Nitrite NEGATIVE NEGATIVE    Leukocytes,Ua NEGATIVE NEGATIVE    RBC / HPF 0-5 0 - 5 RBC/hpf    WBC, UA 0-5 0 - 5 WBC/hpf    Bacteria, UA NONE SEEN NONE SEEN    Squamous Epithelial / LPF 0-5 0 - 5    Mucus PRESENT         Imaging Results (Last 48 hours)  DG Chest Port 1 View   Result Date: 02/24/2020 CLINICAL DATA:  Fever. EXAM: PORTABLE CHEST 1 VIEW COMPARISON:  08/02/2015 FINDINGS: Lungs are adequately inflated with subtle prominence of the bronchovascular markings over the infrahilar regions. No focal lobar consolidation or effusion. Cardiomediastinal silhouette and remainder of the exam is unchanged. IMPRESSION: Slight prominence of the bilateral infrahilar bronchovascular markings which may be due to minimal vascular congestion versus viral bronchopneumonia. Electronically Signed   By: Marin Olp M.D.   On: 02/24/2020 11:25    ECHOCARDIOGRAM COMPLETE   Result Date: 02/23/2020    ECHOCARDIOGRAM REPORT   Patient Name:   Richard Vaughn Date of Exam: 02/23/2020 Medical Rec #:  [id number]      Height:       74.0 in Accession #:    [id number]     Weight:       254.6 lb Date of Birth:   Dec 26, 1950     BSA:          2.408 m Patient Age:    43 years       BP:           116/85 mmHg Patient Gender: M              HR:           46 bpm. Exam Location:  Inpatient Procedure: 2D Echo, 3D Echo, Cardiac Doppler and Color Doppler Indications:    Stroke  History:        Patient has prior history of Echocardiogram examinations, most                 recent 04/16/2018. Abnormal ECG, Stroke; Risk Factors:Current                 Smoker, Dyslipidemia, Hypertension and Sleep Apnea. Marijuana                 use. ICH.  Sonographer:    Roseanna Rainbow RDCS Referring Phys: [id number] ASHISH ARORA  Sonographer Comments: Suboptimal apical window. Image acquisition challenging due to respiratory motion. Patient could not follow breathing instructions. IMPRESSIONS  1. Left ventricular ejection fraction, by estimation, is 60 to 65%. The left ventricle has normal function. The left ventricle has no regional wall motion abnormalities. There is moderate left ventricular hypertrophy of the basal-septal segment. Left ventricular diastolic parameters were normal.  2. Right  ventricular systolic function is normal. The right ventricular size is normal. Tricuspid regurgitation signal is inadequate for assessing PA pressure.  3. The mitral valve is normal in structure. Trivial mitral valve regurgitation. No evidence of mitral stenosis.  4. The aortic valve was not well visualized. Aortic valve regurgitation is not visualized. Mild aortic valve sclerosis is present, with no evidence of aortic valve stenosis.  5. The inferior vena cava is dilated in size with >50% respiratory variability, suggesting right atrial pressure of 8 mmHg. FINDINGS  Left Ventricle: Left ventricular ejection fraction, by estimation, is 60 to 65%. The left ventricle has normal function. The left ventricle has no regional wall motion abnormalities. The left ventricular internal cavity size was normal in size. There is  moderate left ventricular hypertrophy of the  basal-septal segment. Left ventricular diastolic parameters were normal. Normal left ventricular filling pressure. Right Ventricle: The right ventricular size is normal. No increase in right ventricular wall thickness. Right ventricular systolic function is normal. Tricuspid regurgitation signal is inadequate for assessing PA pressure. Left Atrium: Left atrial size was normal in size. Right Atrium: Right atrial size was normal in size. Pericardium: There is no evidence of pericardial effusion. Mitral Valve: The mitral valve is normal in structure. Normal mobility of the mitral valve leaflets. Trivial mitral valve regurgitation. No evidence of mitral valve stenosis. Tricuspid Valve: The tricuspid valve is normal in structure. Tricuspid valve regurgitation is not demonstrated. No evidence of tricuspid stenosis. Aortic Valve: The aortic valve was not well visualized. Aortic valve regurgitation is not visualized. Mild aortic valve sclerosis is present, with no evidence of aortic valve stenosis. Pulmonic Valve: The pulmonic valve was normal in structure. Pulmonic valve regurgitation is not visualized. No evidence of pulmonic stenosis. Aorta: The aortic root is normal in size and structure. Venous: The inferior vena cava is dilated in size with greater than 50% respiratory variability, suggesting right atrial pressure of 8 mmHg. IAS/Shunts: No atrial level shunt detected by color flow Doppler.  LEFT VENTRICLE PLAX 2D LVIDd:         4.80 cm      Diastology LVIDs:         3.10 cm      LV e' lateral:   11.60 cm/s LV PW:         1.30 cm      LV E/e' lateral: 6.3 LV IVS:        1.50 cm      LV e' medial:    7.07 cm/s                             LV E/e' medial:  10.4  LV Volumes (MOD) LV vol d, MOD A2C: 165.0 ml LV vol d, MOD A4C: 141.0 ml LV vol s, MOD A2C: 52.6 ml LV vol s, MOD A4C: 48.6 ml LV SV MOD A2C:     112.4 ml LV SV MOD A4C:     141.0 ml LV SV MOD BP:      105.9 ml RIGHT VENTRICLE             IVC RV S prime:     12.60  cm/s  IVC diam: 2.30 cm TAPSE (M-mode): 2.7 cm LEFT ATRIUM           Index       RIGHT ATRIUM           Index LA diam:  3.10 cm 1.29 cm/m  RA Area:     23.80 cm LA Vol (A2C): 36.4 ml 15.11 ml/m RA Volume:   68.40 ml  28.40 ml/m LA Vol (A4C): 38.7 ml 16.07 ml/m  AORTIC VALVE LVOT Vmax:   103.00 cm/s LVOT Vmean:  71.500 cm/s LVOT VTI:    0.246 m  AORTA Ao Root diam: 3.60 cm Ao Asc diam:  4.10 cm MITRAL VALVE MV Area (PHT): 3.60 cm    SHUNTS MV Decel Time: 211 msec    Systemic VTI: 0.25 m MV E velocity: 73.30 cm/s MV A velocity: 54.80 cm/s MV E/A ratio:  1.34 Fransico Him MD Electronically signed by Fransico Him MD Signature Date/Time: 02/23/2020/12:38:04 PM    Final          Assessment/Plan: Diagnosis: 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 Does the need for close, 24 hr/day medical supervision in concert with the patient's rehab needs make it unreasonable for this patient to be served in a less intensive setting? Yes 2. Co-Morbidities requiring supervision/potential complications: HTN, prior CVA's, chronic low back pain 3. Due to bladder management, bowel management, safety, skin/wound care, disease management, medication administration, pain management and patient education, does the patient require 24 hr/day rehab nursing? Yes 4. Does the patient require coordinated care of a physician, rehab nurse, therapy disciplines of PT, OT, SLP to address physical and functional deficits in the context of the above medical diagnosis(es)? Yes Addressing deficits in the following areas: balance, endurance, locomotion, strength, transferring, bowel/bladder control, dressing, grooming, speech and psychosocial support 5. Can the patient actively participate in an intensive therapy program of at least 3 hrs of therapy per day at least 5 days per week? Yes 6. The potential for patient to make measurable gains while on inpatient rehab is excellent 7. Anticipated functional  outcomes upon discharge from inpatient rehab are modified independent and supervision  with PT, modified independent and supervision with OT, modified independent with SLP. 8. Estimated rehab length of stay to reach the above functional goals is: 9-12 days 9. Anticipated discharge destination: Home 10. Overall Rehab/Functional Prognosis: excellent   RECOMMENDATIONS: This patient's condition is appropriate for continued rehabilitative care in the following setting: [name]  Note that insurance prior authorization may be required for reimbursement for recommended care.   Comment: Rehab Admissions Coordinator to follow up.   Thanks,   Meredith Staggers, MD, Mellody Drown    I have reviewed and concur with the physician assistant's documentation above.     Lavon Paganini Angiulli, PA-C 02/25/2020        Revision History                     Routing History

## 2020-02-27 NOTE — Progress Notes (Signed)
Physical Therapy Treatment Patient Details Name: Richard Vaughn MRN: [id number] DOB: June 29, 1951 Today's Date: 02/27/2020    History of Present Illness 69 y.o. male past medical history of hypertension, hyperlipidemia, Barrett's esophagus, multiple lacunar infarcts, prior small hypertensive bleeds in bilateral deep gray matter structures with most recent [name] in the right thalamus/posterior limb of internal capsule in 2019 with mild residual left-sided weakness presenting to the emergency room for sudden onset of worsening dysarthria and left-sided weakness, vision changes. Pt found to have R thalamic hemorrhage.    PT Comments    Pt making good progress demonstrating increased overall gait distance and improved transfers.  Needs verbal cues for gait and transfer technique.  Dorsiflexion on L significantly improved with verbal cues for heel strike.  Pt did have orthostatic hypotension - notified RN.    Follow Up Recommendations  [name];Supervision/Assistance - 24 hour     Equipment Recommendations  Rolling walker with 5" wheels;Other (comment)(to be further assessed next venue)    Recommendations for Other Services Rehab consult     Precautions / Restrictions Precautions Precautions: Fall;Other (comment) Precaution Comments: SBP<160    Mobility  Bed Mobility               General bed mobility comments: in chair  Transfers Overall transfer level: Needs assistance Equipment used: None;Rolling walker (2 wheeled) Transfers: Sit to/from Stand Sit to Stand: Min assist;Min guard         General transfer comment: Sit to stand x 6 throughout session with cues to push through B UE and LE equally.  Pt required min A to power up first time but min guard with increased time on last efforts  Ambulation/Gait Ambulation/Gait assistance: Min assist Gait Distance (Feet): 40 Feet(x4) Assistive device: Rolling walker (2 wheeled) Gait Pattern/deviations: Decreased step length -  left;Decreased dorsiflexion - left;Step-through pattern Gait velocity: reduced   General Gait Details: Tended to have foot drop on L ankle but with cues for heel strike/toe up he could correct 75% of the time; chair follow with 3 seated rest breaks as needed; had pt take rest breaks as he fatigued and had increased difficiulty with dorsiflexion   Stairs             Wheelchair Mobility    Modified Rankin (Stroke Patients Only) Modified Rankin (Stroke Patients Only) Pre-Morbid Rankin Score: No significant disability Modified Rankin: Moderately severe disability     Balance Overall balance assessment: Needs assistance Sitting-balance support: Feet supported;No upper extremity supported Sitting balance-Leahy Scale: Good     Standing balance support: During functional activity;No upper extremity supported Standing balance-Leahy Scale: Fair Standing balance comment: pt donned mask in standing without UE support required min guard for safety                            Cognition Arousal/Alertness: Awake/alert Behavior During Therapy: WFL for tasks assessed/performed Overall Cognitive Status: Within Functional Limits for tasks assessed                                        Exercises      General Comments General comments (skin integrity, edema, etc.): Pt reporting some lightheadiness while walking.  Checked BP in sitting 126/88 and HR 66 and standing 106/91 and HR 75.  Rehab PA present and aware, also notified RN      Pertinent  Vitals/Pain Pain Assessment: No/denies pain    Home Living                      Prior Function            PT Goals (current goals can now be found in the care plan section) Acute Rehab PT Goals Patient Stated Goal: to return to independence PT Goal Formulation: With patient/family Time For Goal Achievement: 03/08/20 Potential to Achieve Goals: Good Progress towards PT goals: Progressing toward goals     Frequency    Min 4X/week      PT Plan Current plan remains appropriate    Co-evaluation              AM-PAC PT "6 Clicks" Mobility   Outcome Measure  Help needed turning from your back to your side while in a flat bed without using bedrails?: None Help needed moving from lying on your back to sitting on the side of a flat bed without using bedrails?: A Little Help needed moving to and from a bed to a chair (including a wheelchair)?: A Little Help needed standing up from a chair using your arms (e.g., wheelchair or bedside chair)?: A Little Help needed to walk in hospital room?: A Little Help needed climbing 3-5 steps with a railing? : A Lot 6 Click Score: 18    End of Session   Activity Tolerance: Patient tolerated treatment well Patient left: in chair;with call bell/phone within reach;with chair alarm set;with family/visitor present Nurse Communication: Mobility status(orthostatic bp) PT Visit Diagnosis: Unsteadiness on feet (R26.81);Other abnormalities of gait and mobility (R26.89);Muscle weakness (generalized) (M62.81);Other symptoms and signs involving the nervous system (R29.898)     Time: [id number] PT Time Calculation (min) (ACUTE ONLY): 28 min  Charges:  $Gait Training: 8-22 mins $Therapeutic Activity: 8-22 mins                     Maggie Font, PT Acute Rehab Services Pager [phone] [name] [phone]    Karlton Lemon 02/27/2020, 2:04 PM

## 2020-02-28 ENCOUNTER — Inpatient Hospital Stay (HOSPITAL_COMMUNITY): Payer: Federal, State, Local not specified - PPO | Admitting: Speech Pathology

## 2020-02-28 ENCOUNTER — Inpatient Hospital Stay (HOSPITAL_COMMUNITY): Payer: Federal, State, Local not specified - PPO | Admitting: Occupational Therapy

## 2020-02-28 ENCOUNTER — Inpatient Hospital Stay (HOSPITAL_COMMUNITY): Payer: Federal, State, Local not specified - PPO

## 2020-02-28 DIAGNOSIS — I610 Nontraumatic intracerebral hemorrhage in hemisphere, subcortical: Secondary | ICD-10-CM | POA: Diagnosis not present

## 2020-02-28 LAB — COMPREHENSIVE METABOLIC PANEL
ALT: 21 U/L (ref 0–44)
AST: 18 U/L (ref 15–41)
Albumin: 3.7 g/dL (ref 3.5–5.0)
Alkaline Phosphatase: 81 U/L (ref 38–126)
Anion gap: 10 (ref 5–15)
BUN: 15 mg/dL (ref 8–23)
CO2: 21 mmol/L — ABNORMAL LOW (ref 22–32)
Calcium: 9.2 mg/dL (ref 8.9–10.3)
Chloride: 106 mmol/L (ref 98–111)
Creatinine, Ser: 1.08 mg/dL (ref 0.61–1.24)
GFR calc Af Amer: >60 mL/min (ref >60)
GFR calc non Af Amer: >60 mL/min (ref >60)
Glucose, Bld: 87 mg/dL (ref 70–99)
Potassium: 4.3 mmol/L (ref 3.5–5.1)
Sodium: 137 mmol/L (ref 135–145)
Total Bilirubin: 0.8 mg/dL (ref 0.3–1.2)
Total Protein: 6.8 g/dL (ref 6.5–8.1)

## 2020-02-28 LAB — CBC WITH DIFFERENTIAL/PLATELET
Abs Immature Granulocytes: 0.02 10*3/uL (ref 0.00–0.07)
Basophils Absolute: 0 10*3/uL (ref 0.0–0.1)
Basophils Relative: 0 %
Eosinophils Absolute: 0.1 10*3/uL (ref 0.0–0.5)
Eosinophils Relative: 2 %
HCT: 46 % (ref 39.0–52.0)
Hemoglobin: 15.7 g/dL (ref 13.0–17.0)
Immature Granulocytes: 0 %
Lymphocytes Relative: 33 %
Lymphs Abs: 2.6 10*3/uL (ref 0.7–4.0)
MCH: 31.5 pg (ref 26.0–34.0)
MCHC: 34.1 g/dL (ref 30.0–36.0)
MCV: 92.2 fL (ref 80.0–100.0)
Monocytes Absolute: 0.8 10*3/uL (ref 0.1–1.0)
Monocytes Relative: 10 %
Neutro Abs: 4.5 10*3/uL (ref 1.7–7.7)
Neutrophils Relative %: 55 %
Platelets: 164 10*3/uL (ref 150–400)
RBC: 4.99 MIL/uL (ref 4.22–5.81)
RDW: 12.2 % (ref 11.5–15.5)
WBC: 8.1 10*3/uL (ref 4.0–10.5)
nRBC: 0 % (ref 0.0–0.2)

## 2020-02-28 NOTE — Progress Notes (Signed)
[facility] PHYSICAL MEDICINE & REHABILITATION PROGRESS NOTE   Subjective/Complaints:  Pt reports he's "OK" because he's "stuck here". Explained he's here- 1 step closer to home and to get stronger- he brightened a little at that-   Daughter on facetime- said he's more negative than most people;   Denies pain; breathing OK.  LBM 2 days ago- which is normal for him. Denies constipation.  Also notes his speech is not normal.  Frustrated about that.    ROS:  Pt denies SOB, abd pain, CP, N/V/C/D, and vision changes  Objective:   No results found. Recent Labs    02/28/20 0539  WBC 8.1  HGB 15.7  HCT 46.0  PLT 164   Recent Labs    02/27/20 0351 02/28/20 0539  NA 138 137  K 4.0 4.3  CL 105 106  CO2 24 21*  GLUCOSE 92 87  BUN 13 15  CREATININE 0.99 1.08  CALCIUM 9.0 9.2    Intake/Output Summary (Last 24 hours) at 02/28/2020 1019 Last data filed at 02/28/2020 0847 Gross per 24 hour  Intake 120 ml  Output 875 ml  Net -755 ml     Physical Exam: Vital Signs Blood pressure 114/80, pulse 63, temperature 97.8 F (36.6 C), temperature source Oral, resp. rate 18, height 6\' 2"  (1.88 m), weight 113 kg, SpO2 98 %.   Physical Exam  Nursing note and vitals reviewed. Constitutional: sitting up in bed; on facetime with daughter, appropriate, NAD HENT:  R gaze preference?  Neck:  (+) thyroid appears /feels enlarged CV: bradycardic; regular rhythm Respiratory: CTA B/L- good air movement GI: soft, NT, ND, (+)BS- normoactive.  Musculoskeletal:     Comments: Left shoulder with limited ROM due to RTC pathology.  No edema  Neurological: Ox3 Halting speech- slight word finding deficits  Mild left facial weakness with dysarthria.  Able to follow commands without difficulty.  Motor: RUE/RLE: 5/5 proximal to distal LUE/LLE: 4+/5 proximal to distal with apraxia Sensation intact to light touch  Skin: Skin is warm and dry.  Psychiatric: flat slightly depressed affect, but very  cordial- daughter reports this is baseline  Assessment/Plan: 1. Functional deficits secondary to [place] with L hemiparesis  which require 3+ hours per day of interdisciplinary therapy in a comprehensive inpatient rehab setting.  Physiatrist is providing close team supervision and 24 hour management of active medical problems listed below.  Physiatrist and rehab team continue to assess barriers to discharge/monitor patient progress toward functional and medical goals  Care Tool:  Bathing              Bathing assist       Upper Body Dressing/Undressing Upper body dressing        Upper body assist      Lower Body Dressing/Undressing Lower body dressing            Lower body assist       Toileting Toileting    Toileting assist Assist for toileting: Independent with assistive device Assistive Device Comment: (urinal)   Transfers Chair/bed transfer  Transfers assist           Locomotion Ambulation   Ambulation assist              Walk 10 feet activity   Assist           Walk 50 feet activity   Assist           Walk 150 feet activity   Assist  Walk 10 feet on uneven surface  activity   Assist           Wheelchair     Assist               Wheelchair 50 feet with 2 turns activity    Assist            Wheelchair 150 feet activity     Assist          Blood pressure 114/80, pulse 63, temperature 97.8 F (36.6 C), temperature source Oral, resp. rate 18, height 6\' 2"  (1.88 m), weight 113 kg, SpO2 98 %.  Medical Problem List and Plan: 1.  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             -patient may shower  4/15- pt also needs SLP for halting/dysarthria/speech             -ELOS/Goals: 5-9 days/Supervision/Mod I             Admit to [name] 2.  Antithrombotics: -DVT/anticoagulation:  Mechanical: Sequential compression devices, below knee  Bilateral lower extremities             -antiplatelet therapy: N/a due to bleed  3. Chronic back pain/Pain Management: uses Oxymorphone 10 mg bid per Dr. Brien Few pain management--> MS contin 15 mg bid as substitute.  4/15- pain is well controlled- con't meds             Monitor with increased exertion 4. Mood: LCSW to follow for evaluation and support.              -antipsychotic agents: N/A 5. Neuropsych: This patient is capable of making decisions on his own behalf. 6. Skin/Wound Care: Routine pressure relief measures.  7. Fluids/Electrolytes/Nutrition: Monitor I/O. CMP ordered.  8. HTN: Monitor qid--continue Norvasc and Avapro. Orthostatic changes today with PT--encourage fluid intake.   4/15- not dehydrated based on labs- con't to monitor- might require ABd binder/TEDs- will see how he does with therapy.              Monitor with increased mobility. 9.  Left thyroid nodule: Thyroid ultrasound on outpatient basis?   4/15- will let PCP know and arrange outpatient 10. Low grade fevers: Encourage IS--resolving. Continue to monitor vital signs/labs. 11. Barrett's esophagus: On PPI but still has intermittent issues with GERD. 12. Bradycardia  4/15- is baseline per pt- usually runs in 50s- asymptomatic- con't to monitor   LOS: 1 days A FACE TO FACE EVALUATION WAS PERFORMED  Richard Vaughn 02/28/2020, 10:19 AM

## 2020-02-28 NOTE — Evaluation (Signed)
Speech Language Pathology Assessment and Plan  Patient Details  Name: Richard Vaughn MRN: [id number] Date of Birth: [birth year]  SLP Diagnosis: Dysarthria  Rehab Potential: Good ELOS: 7-10 days    Today's Date: 02/28/2020 SLP Individual Time: 2355-7322 SLP Individual Time Calculation (min): 22 min   Problem List:  Patient Active Problem List   Diagnosis Date Noted  . Temperature elevated   . Thyroid nodule   . Treatment-emergent central sleep apnea 10/25/2018  . 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 Chronic pain syndrome   . Chronic bilateral low back pain   . Benign essential HTN   . Tobacco abuse   . Marijuana abuse   . Hyperlipidemia   . History of CVA with residual deficit   . Dysphagia, post-stroke   . ICH (intracerebral hemorrhage) (HCC) - R thalmic/PLIC d/t HTN 02/54/2706  . Insomnia 07/13/2017  . PAD (peripheral artery disease) ([place]) 07/13/2017  . Hyperlipidemia with target LDL less than 70 07/13/2017  . Stroke-like symptom 09/22/2016  . Paresthesia 09/22/2016  . CVA (cerebral vascular accident) ([place]) 09/22/2016  . Cerebrovascular accident (CVA) due to thrombosis of left carotid artery ([place]) 09/23/2015  . Primary snoring 09/23/2015  . Hypersomnia with sleep apnea 09/23/2015  . Obstructive sleep apnea 09/23/2015  . Lacunar infarct, acute ([place]) 08/25/2015  . Sleep apnea 08/25/2015  . Dysarthria   . CVD (cardiovascular disease) 08/02/2015  . Acute hyperglycemia 08/02/2015  . Systolic hypertension with cerebrovascular disease 12/27/2014  . Epistaxis 07/28/2012  . HYPERGLYCEMIA 10/02/2010  . NEPHROLITHIASIS 05/23/2009  . BARRETTS ESOPHAGUS 02/20/2009  . Gastroparesis 02/20/2009  . RADICULOPATHY 10/21/2008  . GERD 10/08/2008  . ESOPHAGITIS 08/15/2008  . ESOPHAGEAL STRICTURE 08/15/2008  . GASTRITIS, ACUTE 08/15/2008  . RENAL CYST 08/14/2008  . TOBACCO ABUSE 08/08/2008  . HEMATURIA, MICROSCOPIC, HX OF 08/08/2008  .  PULMONARY NODULE 01/10/2008   Past Medical History:  Past Medical History:  Diagnosis Date  . Barrett's esophagus   . Chronic back pain   . Gastritis    Mild  . Gastroparesis   . GERD (gastroesophageal reflux disease)   . Headache   . Hyperglycemia   . Hypertension   . Nephrolithiasis   . Renal cyst   . Stricture and stenosis of esophagus   . Stroke ([place]) 07-2015  . Vision abnormalities    Past Surgical History:  Past Surgical History:  Procedure Laterality Date  . [place] SURGERY  2012  . COLONOSCOPY    . [place] SURGERY  2005, 2010   replaced L4 and L5  . UPPER GASTROINTESTINAL ENDOSCOPY      Assessment / Plan / Recommendation Clinical Impression   HPI: Richard Vaughn is a 69 year old male with history of Barrett's esophagus with GERD/ hx of esophageal stricture, HTN, chronic pain--on opana, hypertensive bleeds--last 04/2018, multiple lacunar bleeds, OSA who was admitted on 02/22/2020 with headache, visual changes, left hemiparesis, and slurred speech. CT head showed acute right thalamic hemorrhage with mild surrounding edema and min mas effect as well as advanced chronic small vessel disease. CTA head/neck negative for LVO and 14 mm left thyroid nodule noted with recommendations for dedicated thyroid ultrasound.  Echocardiogram with EF of 60-65% with no wall abnormality and moderate LVH. 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 Dr. Leonie Man felt that right thalamic hypertensive in  nature (BP not elevated at admission) . Hospital course further complicated by low grade fever on 4/12, ? due to viral bronchopneumonia. Therapy ongoing and patient limited by LLE weakness with ataxia, dysarthria and LUE weakness affecting ADLs. Richard Vaughn admitted to [name] 02/27/20 and SLP evaluation was completed 02/28/20 with results as follows:  Richard Vaughn presents with moderate  dysarthria that has a mild impact on his intelligibility at the sentence and conversation level, most functionally. He was ~80% intelligible throughout evaluation and required moderate cues for clarification and use of strategies. Although Richard Vaughn's speech was dysarthric at baseline (after CVA in 2020), it has worsened since this admission. Spoke with Richard Vaughn's wife via telephone and she reported Richard Vaughn's speech has become increasingly labored and slow, further impacting his intelligibility. Richard Vaughn also reports significant frustration with reduced intelligibility and having to repeat himself to both familiar and unfamiliar listeners. He stated "sometimes I'd just rather not talk" and expressed interest in continuing to receive treatment targeting his speech intelligibility. His receptive and expressive language was [facility] and cognition and swallowing were determined to be [facility] by acute SLP with no need for follow up prior to admission to [name].   Recommend Richard Vaughn receive skilled ST services while inpatient to address dysarthria in order to maximize functional communication and independence with use of strategies prior to discharge.     Skilled Therapeutic Interventions          Cognitive-linguistic evaluation was compelted and results were shared with Richard Vaughn (and wife via phone) - please see above for details.    SLP Assessment  Patient will need skilled [place] [facility] Services during [name] admission    Recommendations  Patient destination: Home Follow up Recommendations: Outpatient SLP    SLP Frequency 3 to 5 out of 7 days   SLP Duration  SLP Intensity  SLP Treatment/Interventions    Minumum of 1-2 x/day, 30 to 90 minutes  Cueing hierarchy;Functional tasks;Patient/family education;Speech/Language facilitation    Pain Pain Assessment Pain Scale: Faces Pain Score: 2  Faces Pain Scale: No hurt Pain Type: Chronic pain Pain Location: Back Pain Orientation: Lower Pain Descriptors / Indicators: Aching Pain  Frequency: Constant Pain Onset: On-going Pain Intervention(s): Medication (See eMAR)  Prior Functioning    SLP Evaluation Cognition Overall Cognitive Status: Within Functional Limits for tasks assessed Orientation Level: Oriented X4 Attention: Sustained Sustained Attention: Appears intact Memory: Appears intact Awareness: Appears intact Problem Solving: Appears intact Safety/Judgment: Appears intact  Comprehension Auditory Comprehension Overall Auditory Comprehension: Appears within functional limits for tasks assessed Expression Expression Primary Mode of Expression: Verbal Verbal Expression Overall Verbal Expression: Appears within functional limits for tasks assessed Written Expression Written Expression: Not tested Oral Motor Oral Motor/Sensory Function Overall Oral Motor/Sensory Function: Mild impairment Facial ROM: Reduced left Facial Symmetry: Abnormal symmetry left Facial Sensation: Within Functional Limits Lingual ROM: Within Functional Limits Lingual Symmetry: Within Functional Limits Velum: Within Functional Limits Mandible: Within Functional Limits Motor Speech Overall Motor Speech: Impaired at baseline(although worsened with this admission) Respiration: Within functional limits Phonation: Normal Resonance: Within functional limits Articulation: Impaired Level of Impairment: Word Intelligibility: Intelligibility reduced Word: 75-100% accurate Phrase: 75-100% accurate Sentence: 75-100% accurate Conversation: 75-100% accurate Motor Planning: Witnin functional limits Motor Speech Errors: Not applicable Effective Techniques: Increased vocal intensity;Over-articulate   Intelligibility: Intelligibility reduced Word: 75-100% accurate Phrase: 75-100% accurate Sentence: 75-100% accurate Conversation: 75-100% accurate   Short Term Goals: Week 1: SLP Short Term Goal 1 (Week 1): Richard Vaughn will implement increased vocal intensity and overarticulation strategies  during  sentence and conversation level tasks with Min A cues. SLP Short Term Goal 2 (Week 1): Richard Vaughn will achieve 85% intelligibility in conversation with Min A multimodal cues for speech intelligibility straegies.  Refer to Care Plan for Long Term Goals  Recommendations for other services: Neuropsych  Discharge Criteria: Patient will be discharged from SLP if patient refuses treatment 3 consecutive times without medical reason, if treatment goals not met, if there is a change in medical status, if patient makes no progress towards goals or if patient is discharged from hospital.  The above assessment, treatment plan, treatment alternatives and goals were discussed and mutually agreed upon: by patient  Arbutus Leas 02/28/2020, 12:08 PM

## 2020-02-28 NOTE — Progress Notes (Signed)
Inpatient Rehabilitation  Patient information reviewed and entered into eRehab system by Neelah Mannings M. Diamantina Edinger, M.A., CCC/SLP, PPS Coordinator.  Information including medical coding, functional ability and quality indicators will be reviewed and updated through discharge.    

## 2020-02-28 NOTE — Evaluation (Signed)
Occupational Therapy Assessment and Plan  Patient Details  Name: QUAID YEAKLE MRN: [id number] Date of Birth: [birth year]  OT Diagnosis: hemiplegia affecting non-dominant side and muscle weakness (generalized) Rehab Potential: Rehab Potential (ACUTE ONLY): Good ELOS: 7-10 days   Today's Date: 02/28/2020 OT Individual Time: 1000-1115 OT Individual Time Calculation (min): 75 min     Problem List:  Patient Active Problem List   Diagnosis Date Noted  . Temperature elevated   . Thyroid nodule   . Treatment-emergent central sleep apnea 10/25/2018  . 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 Chronic pain syndrome   . Chronic bilateral low back pain   . Benign essential HTN   . Tobacco abuse   . Marijuana abuse   . Hyperlipidemia   . History of CVA with residual deficit   . Dysphagia, post-stroke   . ICH (intracerebral hemorrhage) (HCC) - R thalmic/PLIC d/t HTN 33/54/5625  . Insomnia 07/13/2017  . PAD (peripheral artery disease) ([place]) 07/13/2017  . Hyperlipidemia with target LDL less than 70 07/13/2017  . Stroke-like symptom 09/22/2016  . Paresthesia 09/22/2016  . CVA (cerebral vascular accident) ([place]) 09/22/2016  . Cerebrovascular accident (CVA) due to thrombosis of left carotid artery ([place]) 09/23/2015  . Primary snoring 09/23/2015  . Hypersomnia with sleep apnea 09/23/2015  . Obstructive sleep apnea 09/23/2015  . Lacunar infarct, acute ([place]) 08/25/2015  . Sleep apnea 08/25/2015  . Dysarthria   . CVD (cardiovascular disease) 08/02/2015  . Acute hyperglycemia 08/02/2015  . Systolic hypertension with cerebrovascular disease 12/27/2014  . Epistaxis 07/28/2012  . HYPERGLYCEMIA 10/02/2010  . NEPHROLITHIASIS 05/23/2009  . BARRETTS ESOPHAGUS 02/20/2009  . Gastroparesis 02/20/2009  . RADICULOPATHY 10/21/2008  . GERD 10/08/2008  . ESOPHAGITIS 08/15/2008  . ESOPHAGEAL STRICTURE 08/15/2008  . GASTRITIS, ACUTE 08/15/2008  . RENAL CYST 08/14/2008   . TOBACCO ABUSE 08/08/2008  . HEMATURIA, MICROSCOPIC, HX OF 08/08/2008  . PULMONARY NODULE 01/10/2008    Past Medical History:  Past Medical History:  Diagnosis Date  . Barrett's esophagus   . Chronic back pain   . Gastritis    Mild  . Gastroparesis   . GERD (gastroesophageal reflux disease)   . Headache   . Hyperglycemia   . Hypertension   . Nephrolithiasis   . Renal cyst   . Stricture and stenosis of esophagus   . Stroke ([place]) 07-2015  . Vision abnormalities    Past Surgical History:  Past Surgical History:  Procedure Laterality Date  . [place] SURGERY  2012  . COLONOSCOPY    . [place] SURGERY  2005, 2010   replaced L4 and L5  . UPPER GASTROINTESTINAL ENDOSCOPY      Assessment & Plan Clinical Impression: Patient is a 69 y.o. year old male with history of Barrett's esophagus with GERD/ hx of esophageal stricture, HTN, chronic pain--on opana, hypertensive bleeds--last 04/2018, multiple lacunar bleeds, OSA who was admitted on 02/22/2020 with headache, visual changes, left hemiparesis, and slurred speech. CT head showed acute right thalamic hemorrhage with mild surrounding edema and min mas effect as well as advanced chronic small vessel disease. CTA head/neck negative for LVO and 14 mm left thyroid nodule noted with recommendations for dedicated thyroid ultrasound.  Echocardiogram with EF of 60-65% with no wall abnormality and moderate LVH. 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 Dr. Leonie Man felt  that right thalamic hypertensive in nature (BP not elevated at admission) . Hospital course further complicated by low grade fever on 4/12, ? due to viral bronchopneumonia. Therapy ongoing and patient limited by LLE weakness with ataxia, dysarthria and LUE weakness affecting ADLs.  Patient transferred to [name] on 02/27/2020 .    Patient currently requires  min with basic self-care skills and IADL secondary to decreased cardiorespiratoy endurance, motor apraxia, decreased coordination and decreased motor planning and decreased standing balance, decreased postural control and hemiplegia.  Prior to hospitalization, patient could complete ADL with independent .  Patient will benefit from skilled intervention to decrease level of assist with basic self-care skills and increase independence with basic self-care skills prior to discharge home with care partner.  Anticipate patient will require intermittent supervision and follow up outpatient.  OT - End of Session Activity Tolerance: Tolerates 30+ min activity with multiple rests Endurance Deficit: Yes Endurance Deficit Description: fatigue with adl tasks OT Assessment Rehab Potential (ACUTE ONLY): Good OT Patient demonstrates impairments in the following area(s): Balance;Endurance;Motor OT Basic ADL's Functional Problem(s): Grooming;Bathing;Dressing;Toileting OT Advanced ADL's Functional Problem(s): Simple Meal Preparation OT Transfers Functional Problem(s): Toilet;Tub/Shower OT Plan OT Intensity: Minimum of 1-2 x/day, 45 to 90 minutes OT Frequency: 5 out of 7 days OT Duration/Estimated Length of Stay: 7-10 days OT Treatment/Interventions: Balance/vestibular training;Neuromuscular re-education;Self Care/advanced ADL retraining;Therapeutic Exercise;DME/adaptive equipment instruction;UE/LE Strength taining/ROM;Community reintegration;Patient/family education;UE/LE Coordination activities;Discharge planning;Functional mobility training;Therapeutic Activities OT Self Feeding Anticipated Outcome(s): independent OT Basic Self-Care Anticipated Outcome(s): set up/mod I OT Toileting Anticipated Outcome(s): supervision OT Bathroom Transfers Anticipated Outcome(s): supervision OT Recommendation Patient destination: Home Follow Up Recommendations: Outpatient OT Equipment Recommended: None recommended by  OT Equipment Details: patient states that he has RW, [facility], commode and shower bench   Skilled Therapeutic Intervention Patient seated in recliner, alert and ready for therapy session.  Evaluation completed as documented below.  Patient presents with left side weakness and coordination deficit, balance impairment and general deconditioning.  He will benefit from comprehensive therapy program to maximize functional independence and mobility to facilitate a safe return to home with wife.  Reviewed role of OT, plan of care, scheduling, home environment, DME, safety and goals for therapy program.  He demonstrates a good understanding. Completed ADL and mobility training to include ambulation with RW in room environment, transfers to shower bench and chair. - he requires CGA/ min A for mobility and transfers at this time due to LOB and endurance deficit.  He completed shower - CS when seated and CGA in stance for balance.  Dressing completed seated in arm chair - CS for [place] shirt, CGA for pants, min a for footwear.  He returned to recliner at close of session.   Seat belt alarm set and call bell in reach.    OT Evaluation Precautions/Restrictions  Precautions Precautions: None Restrictions Weight Bearing Restrictions: No General   Vital Signs Therapy Vitals Temp: 98.7 F (37.1 C) Temp Source: Oral Pulse Rate: 79 Resp: 18 BP: (!) 123/106 Patient Position (if appropriate): Sitting Oxygen Therapy SpO2: 98 % O2 Device: Room Air Pain Pain Assessment Pain Scale: 0-10 Pain Score: 2  Pain Type: Chronic pain Pain Location: Back Pain Orientation: Lower Pain Descriptors / Indicators: Discomfort Pain Intervention(s): Repositioned;[place] expects to be discharged to:: Private residence Living Arrangements: Spouse/significant other Available Help at Discharge: Family, Available PRN/intermittently Type of Home: House Home Access: Stairs to  enter [employer] of Steps: 4 Entrance Stairs-Rails: Right, Left, Can reach  both Home Layout: Laundry or work area in basement, Two level Alternate Therapist, sports of Steps: flight of stairs to get into basement Alternate Level Stairs-Rails: Can reach both, Left, Right Bathroom Shower/Tub: Multimedia programmer: Standard Bathroom Accessibility: Yes  Lives With: Spouse Prior Function Level of Independence: Independent with basic ADLs, Independent with transfers, Independent with homemaking with ambulation, Independent with gait  Able to Take Stairs?: Yes Driving: Yes Vocation: On disability 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 Leisure: Hobbies-yes (Comment) Comments: pt enjoys walking with his wife ADL ADL Eating: Set up Where Assessed-Eating: Chair Grooming: Contact guard Where Assessed-Grooming: Standing at sink Upper Body Bathing: Supervision/safety Where Assessed-Upper Body Bathing: Shower Lower Body Bathing: Contact guard Where Assessed-Lower Body Bathing: Shower Upper Body Dressing: Supervision/safety Where Assessed-Upper Body Dressing: Chair Lower Body Dressing: Minimal assistance Where Assessed-Lower Body Dressing: Chair Toileting: Contact guard Where Assessed-Toileting: Glass blower/designer: Therapist, music Method: Counselling psychologist: Energy manager: Environmental education officer Method: Heritage manager: Radio broadcast assistant, Grab bars Vision Baseline Vision/History: Wears glasses Wears Glasses: At all times Patient Visual Report: No change from baseline Vision Assessment?: Yes Eye Alignment: Within Functional Limits Ocular Range of Motion: Within Functional Limits Alignment/Gaze Preference: Within Defined Limits Tracking/Visual Pursuits: Able to track stimulus in all quads without difficulty Saccades: Within functional limits Convergence:  Within functional limits Visual Fields: No apparent deficits Perception  Perception: Within Functional Limits Praxis Praxis: Intact Cognition Overall Cognitive Status: Within Functional Limits for tasks assessed Arousal/Alertness: Awake/alert Orientation Level: Person;Place;Situation Person: Oriented Place: Oriented Situation: Oriented Year: 2021 Month: April Day of Week: Correct Memory: Appears intact Immediate Memory Recall: Sock;Blue;Bed Memory Recall Sock: Without Cue Memory Recall Blue: Without Cue Memory Recall Bed: Not able to recall Attention: Sustained Sustained Attention: Appears intact Awareness: Appears intact Problem Solving: Appears intact Safety/Judgment: Appears intact Sensation Sensation Light Touch: Appears Intact Coordination Gross Motor Movements are Fluid and Coordinated: No Fine Motor Movements are Fluid and Coordinated: No Finger Nose Finger Test: mild dysmetria on L UE Heel Shin Test: [facility] bilaterally 9 Hole Peg Test: R = 32 sec. L = 55 sec           box and blocks:   R = 39, L = 18 Motor  Motor Motor: Hemiplegia Motor - Skilled Clinical Observations: left side weakness and coordination deficit Mobility  Transfers Sit to Stand: Minimal Assistance - Patient > 75%;Contact Guard/Touching assist Stand to Sit: Contact Guard/Touching assist  Trunk/Postural Assessment  Cervical Assessment Cervical Assessment: Within Functional Limits(hx surgery cervical spine) Thoracic Assessment Thoracic Assessment: Within Functional Limits Lumbar Assessment Lumbar Assessment: Within Functional Limits(hx surgery lumbar spine) Postural Control Postural Control: Within Functional Limits  Balance Balance Balance Assessed: Yes Static Sitting Balance Static Sitting - Level of Assistance: 6: Modified independent (Device/Increase time) Dynamic Sitting Balance Dynamic Sitting - Level of Assistance: 5: Stand by assistance Static Standing Balance Static Standing -  Level of Assistance: 4: Min assist Dynamic Standing Balance Dynamic Standing - Level of Assistance: 3: Mod assist Extremity/Trunk Assessment RUE Assessment RUE Assessment: Within Functional Limits General Strength Comments: 5/5 LUE Assessment Active Range of Motion (AROM) Comments: WFLs t/o General Strength Comments: hx left sh RTC injury, proximal strength limited by pain - at least 3+/5, distal 4/5     Refer to Care Plan for Long Term Goals  Recommendations for other services: None    Discharge Criteria: Patient will be discharged from OT if patient  refuses treatment 3 consecutive times without medical reason, if treatment goals not met, if there is a change in medical status, if patient makes no progress towards goals or if patient is discharged from hospital.  The above assessment, treatment plan, treatment alternatives and goals were discussed and mutually agreed upon: by patient  Carlos Levering 02/28/2020, 4:05 PM

## 2020-02-28 NOTE — Evaluation (Signed)
Physical Therapy Assessment and Plan  Patient Details  Name: NITIN MCKOWEN MRN: [id number] Date of Birth: [birth year]  PT Diagnosis: Abnormal posture, Abnormality of gait, Coordination disorder, Difficulty walking, Hemiparesis non-dominant and Muscle weakness Rehab Potential: Good ELOS: 7-10 days   Today's Date: 02/28/2020 PT Individual Time: 3329-5188 PT Individual Time Calculation (min): 56 min    Problem List:  Patient Active Problem List   Diagnosis Date Noted  . Temperature elevated   . Thyroid nodule   . Treatment-emergent central sleep apnea 10/25/2018  . 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 Chronic pain syndrome   . Chronic bilateral low back pain   . Benign essential HTN   . Tobacco abuse   . Marijuana abuse   . Hyperlipidemia   . History of CVA with residual deficit   . Dysphagia, post-stroke   . ICH (intracerebral hemorrhage) (HCC) - R thalmic/PLIC d/t HTN 41/66/0630  . Insomnia 07/13/2017  . PAD (peripheral artery disease) ([place]) 07/13/2017  . Hyperlipidemia with target LDL less than 70 07/13/2017  . Stroke-like symptom 09/22/2016  . Paresthesia 09/22/2016  . CVA (cerebral vascular accident) ([place]) 09/22/2016  . Cerebrovascular accident (CVA) due to thrombosis of left carotid artery ([place]) 09/23/2015  . Primary snoring 09/23/2015  . Hypersomnia with sleep apnea 09/23/2015  . Obstructive sleep apnea 09/23/2015  . Lacunar infarct, acute ([place]) 08/25/2015  . Sleep apnea 08/25/2015  . Dysarthria   . CVD (cardiovascular disease) 08/02/2015  . Acute hyperglycemia 08/02/2015  . Systolic hypertension with cerebrovascular disease 12/27/2014  . Epistaxis 07/28/2012  . HYPERGLYCEMIA 10/02/2010  . NEPHROLITHIASIS 05/23/2009  . BARRETTS ESOPHAGUS 02/20/2009  . Gastroparesis 02/20/2009  . RADICULOPATHY 10/21/2008  . GERD 10/08/2008  . ESOPHAGITIS 08/15/2008  . ESOPHAGEAL STRICTURE 08/15/2008  . GASTRITIS, ACUTE 08/15/2008  .  RENAL CYST 08/14/2008  . TOBACCO ABUSE 08/08/2008  . HEMATURIA, MICROSCOPIC, HX OF 08/08/2008  . PULMONARY NODULE 01/10/2008    Past Medical History:  Past Medical History:  Diagnosis Date  . Barrett's esophagus   . Chronic back pain   . Gastritis    Mild  . Gastroparesis   . GERD (gastroesophageal reflux disease)   . Headache   . Hyperglycemia   . Hypertension   . Nephrolithiasis   . Renal cyst   . Stricture and stenosis of esophagus   . Stroke ([place]) 07-2015  . Vision abnormalities    Past Surgical History:  Past Surgical History:  Procedure Laterality Date  . [place] SURGERY  2012  . COLONOSCOPY    . [place] SURGERY  2005, 2010   replaced L4 and L5  . UPPER GASTROINTESTINAL ENDOSCOPY      Assessment & Plan Clinical Impression: Patient is a 69 y.o. year old male with history of hypertension, chronic back pain maintained on Opana 10 mg twice daily, quit smoking 22 months ago, hyperlipidemia, Barrett's esophagus, multiple lacunar infarcts, prior small lacunar infarcts, prior small HTN bleeds and bilateral deep gray-white matter structures with most recent ICH in the right thalamus/posterior limb of internal capsule 2019 with mild residual left sided weakness.Presented 02/22/2020 with increasing left-sided weakness and slurred speech. Cranial CT scan positive for acute right thalamic hemorrhagewith mild surrounding edema with minimal mass-effect. CT angiogram head and neck showed no large vessel occlusion there was an incidental finding of a small chronic 14 mm left thyroid nodule. Patient did not receive TPA. I 2.1 x 2.1 x 2.2 cm acute intraparenchymal hemorrhage centered at the right  thalamus. Multiple remote lacunar infarcts involving the left basal ganglia and thalamus. Echocardiogram with ejection fraction of 65%. Neurology services consulted maintained on Cleviprex for blood pressure control. Tolerating a regular diet.   Patient currently requires mod with  mobility secondary to muscle weakness, impaired timing and sequencing, decreased coordination and decreased motor planning and decreased standing balance, decreased postural control, hemiplegia and decreased balance strategies. Prior to hospitalization, patient was independent  with mobility and lived with Spouse in a House home.  Home access is 3Stairs to enter.  Patient will benefit from skilled PT intervention to maximize safe functional mobility, minimize fall risk and decrease caregiver burden for planned discharge home with intermittent assist.  Anticipate patient will benefit from follow up [facility] at discharge.  PT - End of Session Activity Tolerance: Tolerates 30+ min activity with multiple rests Endurance Deficit: Yes Endurance Deficit Description: pt required multiple rest breaks PT Assessment Rehab Potential (ACUTE/IP ONLY): Good PT Barriers to Discharge: Decreased caregiver support PT Barriers to Discharge Comments: per pt report, his wife is in and out and wouldn't be available to provide 24/7 care PT Patient demonstrates impairments in the following area(s): Balance;Endurance;Motor PT Transfers Functional Problem(s): Bed Mobility;Bed to Chair;Car;Furniture PT Locomotion Functional Problem(s): Ambulation;Wheelchair Mobility;Stairs PT Plan PT Intensity: Minimum of 1-2 x/day ,45 to 90 minutes PT Frequency: 5 out of 7 days PT Duration Estimated Length of Stay: 7-10 days PT Treatment/Interventions: Ambulation/gait training;Community reintegration;DME/adaptive equipment instruction;Neuromuscular re-education;Psychosocial support;Stair training;UE/LE Strength taining/ROM;Wheelchair propulsion/positioning;Balance/vestibular training;Discharge planning;Functional electrical stimulation;Pain management;Therapeutic Activities;UE/LE Coordination activities;Cognitive remediation/compensation;Disease management/prevention;Functional mobility training;Patient/family  education;Splinting/orthotics;Therapeutic Exercise PT Transfers Anticipated Outcome(s): Mod I with LRAD PT Locomotion Anticipated Outcome(s): Supervision with LRAD PT Recommendation Follow Up Recommendations: Home health PT Patient destination: Home Equipment Recommended: To be determined Equipment Details: Pt reports he has a RW at home from previous CVA  Skilled Therapeutic Intervention Evaluation completed (see details above and below) with education on PT POC and goals and individual treatment initiated with focus on functional mobility/transfers, LE strength, dynamic standing balance/coordination, ambulation, simulated car transfers, stair navigation, and improved activity tolerance. Received pt standing at toilet with NT, therapist took over with care. Pt stood at toilet and attempted to void with CGA, however unsuccessful. Pt ambulated 70f with RW CGA back to WC. Pt educated on PT evaluation, [name] policies, and therapy schedule and agreeable. Pt performed WC mobility 466fusing bilateral LEs and supervision. Therapist switched out current WC for a more comfortable one 2/2 pt's height. [facility] and performed simulated car transfer without AD and mod A. Pt navigated 4 steps with 2 rails mod A alternating ascending and descending with a step to and a step through pattern. Pt required verbal cues for stepping sequence and slow cadence for safety. Pt ambulated 2873fith 1 UE support on rail min A. Pt demonstrated decreased stride length, decreased L LE foot clearance/foot drag, and decreased trunk rotation. Pt transported to rehab apartment and transferred stand<>pivot WC<>bed without AD mod A. Pt transferred sit<>supine, rolled L and R with supervision, and supine<>sitting EOB with CGA and increased time. Pt transferred stand<>pivot bed<>WC without AD mod A. Pt [facility]. Pt ambulated 88f48fth RW min A back to recliner. Concluded  session with pt sitting in recliner, needs within reach, and seatbelt alarm on. Safety plan updated.   PT Evaluation Precautions/Restrictions Precautions Precautions: Fall Restrictions Weight Bearing Restrictions: No Home Living/Prior Functioning Home Living Living Arrangements: Spouse/significant other Available  Help at Discharge: Family;Available PRN/intermittently Type of Home: House Home Access: Stairs to enter [employer] of Steps: 3 Entrance Stairs-Rails: Right;Left;Can reach both Home Layout: Laundry or work area in basement;Two level Alternate Level Stairs-Number of Steps: flight of stairs to get into basement Alternate Level Stairs-Rails: Can reach both;Left;Right Bathroom Shower/Tub: Multimedia programmer: Standard Bathroom Accessibility: Yes  Lives With: Spouse Prior Function Level of Independence: Independent with basic ADLs;Independent with transfers;Independent with homemaking with ambulation;Independent with gait  Able to Take Stairs?: Yes Driving: Yes Vocation: On disability 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 Leisure: Hobbies-yes (Comment) Comments: pt enjoys walking with his wife Cognition Overall Cognitive Status: Within Functional Limits for tasks assessed Arousal/Alertness: Awake/alert Orientation Level: Oriented X4 Memory: Appears intact Awareness: Appears intact Problem Solving: Appears intact Safety/Judgment: Appears intact Sensation Sensation Light Touch: Appears Intact Proprioception: Appears Intact Coordination Gross Motor Movements are Fluid and Coordinated: No Fine Motor Movements are Fluid and Coordinated: No Coordination and Movement Description: grossly uncoordinated due to L hemiparesis, decreased balance/postural control, and fatigue Finger Nose Finger Test: mild dysmetria on L UE Heel Shin Test: WFL bilaterally Motor  Motor Motor: Hemiplegia Motor - Skilled Clinical Observations: L hemiparesis,  decreased balance/postural control, and fatigue  Mobility Bed Mobility Bed Mobility: Rolling Right;Rolling Left;Supine to Sit;Sit to Supine Rolling Right: Supervision/verbal cueing Rolling Left: Supervision/Verbal cueing Supine to Sit: Contact Guard/Touching assist Sit to Supine: Contact Guard/Touching assist Transfers Transfers: Sit to Stand;Stand to Sit;Stand Pivot Transfers Sit to Stand: Moderate Assistance - Patient 50-74% Stand to Sit: Contact Guard/Touching assist Stand Pivot Transfers: Moderate Assistance - Patient 50 - 74% Stand Pivot Transfer Details: Verbal cues for sequencing;Verbal cues for technique;Verbal cues for precautions/safety Stand Pivot Transfer Details (indicate cue type and reason): cues for stepping sequence, turning technique, and hand placement with transfers Transfer (Assistive device): None Locomotion  Gait Ambulation: Yes Gait Assistance: Minimal Assistance - Patient > 75% Gait Distance (Feet): 28 Feet Assistive device: None Gait Assistance Details: Verbal cues for gait pattern Gait Assistance Details: verbal cues to increase step length and to increase L hip flexion for foot clearance Gait Gait: Yes Gait Pattern: Impaired Gait Pattern: Decreased step length - right;Decreased step length - left;Decreased stride length;Decreased hip/knee flexion - left;Narrow base of support;Decreased dorsiflexion - left;Decreased weight shift to left;Poor foot clearance - left;Decreased trunk rotation Gait velocity: decreased Stairs / Additional Locomotion Stairs: Yes Stairs Assistance: Moderate Assistance - Patient 50 - 74% Stair Management Technique: Two rails Number of Stairs: 4 Height of Stairs: 6 Wheelchair Mobility Wheelchair Mobility: Yes Wheelchair Assistance: Chartered loss adjuster: Both lower extermities Wheelchair Parts Management: Needs assistance Distance: 69f  Trunk/Postural Assessment  Cervical Assessment Cervical  Assessment: Within Functional Limits Thoracic Assessment Thoracic Assessment: Within Functional Limits Lumbar Assessment Lumbar Assessment: Within Functional Limits Postural Control Postural Control: Deficits on evaluation  Balance Balance Balance Assessed: Yes Static Sitting Balance Static Sitting - Balance Support: Feet supported;Bilateral upper extremity supported Static Sitting - Level of Assistance: 5: Stand by assistance(supervision) Dynamic Sitting Balance Dynamic Sitting - Balance Support: Feet supported;No upper extremity supported Dynamic Sitting - Level of Assistance: 5: Stand by assistance(close supervision/CGA) Static Standing Balance Static Standing - Balance Support: No upper extremity supported Static Standing - Level of Assistance: 4: Min assist Dynamic Standing Balance Dynamic Standing - Balance Support: No upper extremity supported Dynamic Standing - Level of Assistance: 3: Mod assist Extremity Assessment  RLE Assessment RLE Assessment: Ex[facility] to [facility] Comments: grossly generalized to 4-/5  LLE Assessment LLE Assessment: Exceptions to [facility] General Strength Comments: grossly generalized to 4-/5 (except hip flexion 3+/5)  Refer to Care Plan for Long Term Goals  Recommendations for other services: None   Discharge Criteria: Patient will be discharged from PT if patient refuses treatment 3 consecutive times without medical reason, if treatment goals not met, if there is a change in medical status, if patient makes no progress towards goals or if patient is discharged from hospital.  The above assessment, treatment plan, treatment alternatives and goals were discussed and mutually agreed upon: by patient  Alfonse Alpers PT, DPT  02/28/2020, 7:45 AM

## 2020-02-28 NOTE — Progress Notes (Signed)
Social Work Assessment and Plan   Patient Details  Name: Richard Vaughn MRN: [id number] Date of Birth: 05/25/1951  Today's Date: 02/28/2020  Problem List:  Patient Active Problem List   Diagnosis Date Noted  . Temperature elevated   . Thyroid nodule   . Treatment-emergent central sleep apnea 10/25/2018  . 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 Chronic pain syndrome   . Chronic bilateral low back pain   . Benign essential HTN   . Tobacco abuse   . Marijuana abuse   . Hyperlipidemia   . History of CVA with residual deficit   . Dysphagia, post-stroke   . ICH (intracerebral hemorrhage) (HCC) - R thalmic/PLIC d/t HTN 50/27/7412  . Insomnia 07/13/2017  . PAD (peripheral artery disease) ([place]) 07/13/2017  . Hyperlipidemia with target LDL less than 70 07/13/2017  . Stroke-like symptom 09/22/2016  . Paresthesia 09/22/2016  . CVA (cerebral vascular accident) ([place]) 09/22/2016  . Cerebrovascular accident (CVA) due to thrombosis of left carotid artery ([place]) 09/23/2015  . Primary snoring 09/23/2015  . Hypersomnia with sleep apnea 09/23/2015  . Obstructive sleep apnea 09/23/2015  . Lacunar infarct, acute ([place]) 08/25/2015  . Sleep apnea 08/25/2015  . Dysarthria   . CVD (cardiovascular disease) 08/02/2015  . Acute hyperglycemia 08/02/2015  . Systolic hypertension with cerebrovascular disease 12/27/2014  . Epistaxis 07/28/2012  . HYPERGLYCEMIA 10/02/2010  . NEPHROLITHIASIS 05/23/2009  . BARRETTS ESOPHAGUS 02/20/2009  . Gastroparesis 02/20/2009  . RADICULOPATHY 10/21/2008  . GERD 10/08/2008  . ESOPHAGITIS 08/15/2008  . ESOPHAGEAL STRICTURE 08/15/2008  . GASTRITIS, ACUTE 08/15/2008  . RENAL CYST 08/14/2008  . TOBACCO ABUSE 08/08/2008  . HEMATURIA, MICROSCOPIC, HX OF 08/08/2008  . PULMONARY NODULE 01/10/2008   Past Medical History:  Past Medical History:  Diagnosis Date  . Barrett's esophagus   . Chronic back pain   . Gastritis    Mild  .  Gastroparesis   . GERD (gastroesophageal reflux disease)   . Headache   . Hyperglycemia   . Hypertension   . Nephrolithiasis   . Renal cyst   . Stricture and stenosis of esophagus   . Stroke ([place]) 07-2015  . Vision abnormalities    Past Surgical History:  Past Surgical History:  Procedure Laterality Date  . [place] SURGERY  2012  . COLONOSCOPY    . [place] SURGERY  2005, 2010   replaced L4 and L5  . UPPER GASTROINTESTINAL ENDOSCOPY     Social History:  reports that he quit smoking about 22 months ago. His smoking use included cigarettes. He has a 15.00 pack-year smoking history. He has never used smokeless tobacco. He reports that he does not drink alcohol or use drugs.  Family / Support Systems Marital Status: Married How Long?: 24 years Patient Roles: Spouse Spouse/Significant Other: [name] (wife): [phone] Children: 3 adult children Other Supports: None reported Anticipated Caregiver: Wife Ability/Limitations of Caregiver: None reported Caregiver Availability: 24/7 Family Dynamics: Pt lives at home with his wife. Enjoys tasks such as yard work and gardening.  Social History Preferred language: English Religion: [employer] Cultural Background: Pt has worked with [employer] for 34 years Education: 2 years college Read: Yes Write: Yes Employment Status: Retired Date Retired/Disabled/Unemployed: 2018 Age Retired: 65 Public relations account executive Issues: Denies Guardian/Conservator: N/A   Abuse/Neglect Abuse/Neglect Assessment Can Be Completed: Yes Physical Abuse: Denies Verbal Abuse: Denies Sexual Abuse: Denies Exploitation of patient/patient's resources: Denies Self-Neglect: Denies  Emotional Status Pt's affect, behavior and adjustment status: Pt  appears to be in good spirits Recent Psychosocial Issues: Denies Psychiatric History: Denies Substance Abuse History: Denies; pt admits he quit smoking cigarettes last years (56yrsmoking hx)  Patient  / Family Perceptions, Expectations & Goals Pt/Family understanding of illness & functional limitations: Pt has general understanding of his conditon Premorbid pt/family roles/activities: Independent Anticipated changes in roles/activities/participation: MOD I to sup Pt/family expectations/goals: Pt goal is to "get back use of arms and legs."  [employer] None Premorbid Home Care/DME Agencies: None Transportation available at discharge: Wife to transport home.  Discharge Planning Living Arrangements: Spouse/significant other Support Systems: Spouse/significant other Type of Residence: Private residence Insurance Resources: [employer] PMultimedia programmer(specify)(BCBS) Financial Resources: Social Security, Other (Comment)(retirement) Financial Screen Referred: No Living Expenses: Own Money Management: Spouse, Patient Does the patient have any problems obtaining your medications?: No Social Work Anticipated Follow Up Needs: HH/OP  Clinical Impression SW met with pt in room to introduce self, explain role, and discuss discharge process. Pt is a [phone] Pt does not use any VA benefits. Pt DME: RW and cane.   Lanyia Jewel A Holli Rengel 02/28/2020, 4:40 PM

## 2020-02-28 NOTE — Progress Notes (Signed)
While sleeping at about 2am the patients pulse was recorded to be 52. Patient assessed and was asymptomatic. Apical pulse 56 at rest. Patient reports his normal BP at home is usually mid to high 50's. Patient reassessed pulse now 63.

## 2020-02-29 ENCOUNTER — Inpatient Hospital Stay (HOSPITAL_COMMUNITY): Payer: Federal, State, Local not specified - PPO

## 2020-02-29 ENCOUNTER — Inpatient Hospital Stay (HOSPITAL_COMMUNITY): Payer: Federal, State, Local not specified - PPO | Admitting: Speech Pathology

## 2020-02-29 NOTE — Care Management (Signed)
Inpatient Ward Individual Statement of Services  Patient Name:  Richard Vaughn  Date:  02/29/2020  Welcome to the [facility].  Our goal is to provide you with an individualized program based on your diagnosis and situation, designed to meet your specific needs.  With this comprehensive rehabilitation program, you will be expected to participate in at least 3 hours of rehabilitation therapies Monday-Friday, with modified therapy programming on the weekends.  Your rehabilitation program will include the following services:  Physical Therapy (PT), Occupational Therapy (OT), Speech Therapy (ST), 24 hour per day rehabilitation nursing, Therapeutic Recreaction (TR), Psychology, Neuropsychology, Case Management (Social Worker), Rehabilitation Medicine, Nutrition Services, Pharmacy Services and Other  Weekly team conferences will be held on Tuesdays to discuss your progress.  Your Social Worker will talk with you frequently to get your input and to update you on team discussions.  Team conferences with you and your family in attendance may also be held.  Expected length of stay: 7-10 days   Overall anticipated outcome: Independent with an Assistive Device  Depending on your progress and recovery, your program may change. Your Social Worker will coordinate services and will keep you informed of any changes. Your Social Worker's name and contact numbers are listed  below.  The following services may also be recommended but are not provided by the [place] will be made to provide these services after discharge if needed.  Arrangements include referral to agencies that provide these services.  Your insurance has been verified to be:  Medicare A/B and [place]  Your primary doctor is:  Betty  [place]  Pertinent information will be shared with your doctor and your insurance company.  Social Worker:  Loralee Pacas, [place] or ([phone]  Information discussed with and copy given to patient by: Rana Snare, 02/29/2020, 3:27 PM

## 2020-02-29 NOTE — Progress Notes (Signed)
Social Work Patient ID: CAROLD BRYS, male   DOB: 06/03/1951, 69 y.o.   MRN: [id number]   SW called pt wife [name] [phone]) to introduce self, explain role, and discuss discharge process. No questions/concerns reported.    Loralee Pacas, MSW, [place] Office: [phone] Cell: [phone] Fax: [phone]

## 2020-02-29 NOTE — Progress Notes (Signed)
Physical Therapy Session Note  Patient Details  Name: Richard Vaughn MRN: [id number] Date of Birth: 08-Aug-1951  Today's Date: 02/29/2020 PT Individual Time: 1445-1535 and 1300-1400 PT Individual Time Calculation (min): 50 min and 60 min  Short Term Goals: Week 1:  PT Short Term Goal 1 (Week 1): STG=LTG due to LOS Week 2:    Week 3:     Skilled Therapeutic Interventions/Progress Updates:   SESSION ONE:  PAIN Denies pain  Pt initially supine and agreeable to treatment.  Supine to sit w/min assist and cues.  Maintains static sitting while donning shoes w/set up and min assist w/L shoe only, ties shoes w/additional time/decreased coord L hand.  STS w/mod assist, cues to achieve erect standing.   SPT to wc w/mod assist of 1  wc propulsion 180ft initially w/bilat UEs but unable to maintain straight path, transitioned to w/bilat LEs w/cues to attend to L LE and L enironment due to decreased awarness.  Pt SPT to mat w/mod assist of 1, slow to transition to upright.  Introduced pt to [employer] and discussed purpose of this device for safety w/balance training.  Pt agreeable to use and therapist donned harness in sitting/completed in standing.  Balance/NMRE in maxiski: Alternating tapping 6in/downgraded to 4inch step w/mod assist to maintain wt shift to R w/tapping on L.  Pt became frustrated w/this task, educated pt and encouraged pt re: purpose of challenging dynamic tasks and pt continued w/task.  Sidestepping 24ft L/R repeated x 2 w/cues for increasing step ht/length L.  Gait 23ft x 2 without AD w/cues for wt shifting/step length/attend to L  Mult rest breaks in sitting between activities.   Harness removed by therapist in standing/sitting.  SPT to wc w/min assist.  wc propulsion x 135ft w/bilat LEs as described above.   Pt left oob in wc w/alarm belt set and needs in reach    Session Two PAIN Denies pain  Pt found initially standing at commode.  Assisted pt w/raising  pants from floor.  Gait 59ft to WC w/rw and cues, cga.  Educated pt about importance for calling for assistance at all times.  wc propulsion 156ft using bilat LEs w/cues to attend to L as described above.    STS from wc w/cga and cues for hand placement, slow transition.   Gait 267ft w/RW w/cga and verbal cues to attend to L, decreased clearance LLE approx 10%time due to inattention. At midstance L pt demonstrates increased L lateral trunk excursion and mild increased L hip flexion/retraction due to decreased stability.  Advances LLE w/mild ataxia and audible initial contact.  Repeated STS from wc x 10 reps w/cues for hand positioning initially, improved hand placement w/repitition.     Standing at hi/lo table w/hands on table performed: Marching x 20 Heel raises x 20 Hamstring curls x 10 each w/cues on L for full ROM Leaning on elbows/forearms in wbing hip extension x 5 each  At end of session pt transported back to room. Pt left oob in wc w/alarm belt donned, no box in room at this time and needs in reach  Nursing tech immediately called and notified pt needed alarm box, agreed to address immediately.  Also notified NT that pt found standing at commode at beginning of session and will need close monitoring.      Therapy Documentation Precautions:  Precautions Precautions: Fall Restrictions Weight Bearing Restrictions: No    Therapy/Group: Individual Therapy  Callie Fielding, [place] 02/29/2020, 4:05  PM  

## 2020-02-29 NOTE — Progress Notes (Signed)
Speech Language Pathology Daily Session Note  Patient Details  Name: Richard Vaughn MRN: [id number] Date of Birth: 06/14/1951  Today's Date: 02/29/2020 SLP Individual Time: [id number] SLP Individual Time Calculation (min): 44 min  Short Term Goals: Week 1: SLP Short Term Goal 1 (Week 1): Pt will implement increased vocal intensity and overarticulation strategies during sentence and conversation level tasks with Min A cues. SLP Short Term Goal 2 (Week 1): Pt will achieve 85% intelligibility in conversation with Min A multimodal cues for speech intelligibility straegies.  Skilled Therapeutic Interventions: Pt was seen for skilled ST targeting speech intelligibility for dysarthria. Pt with good verbal recall of speech strategies, whenever Moderate verbal cues required for implementation of increased vocal intensity and overarticulation during a sentence level barrier speech task. Cueing decreased to Min A during functional conversational tasks and pt was ~85% intelligible in conversation. Pt with relatively good awareness of speech errors and intermittently self corrected with implementation of overarticulation and slower rate intermittently. Pt with appropriate questions regarding diagnosis (dysarthria) and prognosis. Discussed follow up OP ST.  Also of note, pt also reported feeling "down" and expressed interest in neuropsychologist consult to discuss coping strategies. SLP contacted social worker to request referral/consult. Pt left laying in bed with alarm set and needs within reach. Continue per current plan of care.          Pain Pain Assessment Pain Scale: 0-10 Pain Score: 0-No pain  Therapy/Group: Individual Therapy  Arbutus Leas 02/29/2020, 8:30 AM

## 2020-02-29 NOTE — Progress Notes (Signed)
Occupational Therapy Session Note  Patient Details  Name: Richard Vaughn MRN: [id number] Date of Birth: [birth year]  Today's Date: 02/29/2020 OT Individual Time: 1000-1100 OT Individual Time Calculation (min): 60 min    Short Term Goals: Week 1:  OT Short Term Goal 1 (Week 1): NA due to LOS - STG = LTG  Skilled Therapeutic Interventions/Progress Updates:    1:1. Pt received in bed agreeable ot bathing and dressing and elects to perform at sit to stand level at sink. Pt completes ambulatory transfers with MOD A to power up from low bed and CGA to walk to w/c at sink. Pt completes bathing and dressing with CGA to sit to stand from w/c to bathe all body parts except feet (declines). Pt dons UB clothing with set up and LB clothing with MIN A for standing balnce. Oral care with CGA standing at sink. [facility] activities coin palm<>finger manipulation of coins, stacking coins, shifting cards, dealing out cards, shuffling cards. Vc for decreasing compensatory strategies and taking breaks d/t low frustration tolerance. Exited session with pt seated in bed, exit alam on and call light in reach  Therapy Documentation Precautions:  Precautions Precautions: Fall Restrictions Weight Bearing Restrictions: No General:   Vital Signs:  Pain:   ADL: ADL Eating: Set up Where Assessed-Eating: Chair Grooming: Contact guard Where Assessed-Grooming: Standing at sink Upper Body Bathing: Supervision/safety Where Assessed-Upper Body Bathing: Shower Lower Body Bathing: Contact guard Where Assessed-Lower Body Bathing: Shower Upper Body Dressing: Supervision/safety Where Assessed-Upper Body Dressing: Chair Lower Body Dressing: Minimal assistance Where Assessed-Lower Body Dressing: Chair Toileting: Contact guard Where Assessed-Toileting: Glass blower/designer: Therapist, music Method: Counselling psychologist: Energy manager: Environmental health practitioner Method: Heritage manager: Radio broadcast assistant, Systems analyst    Praxis   Exercises:   Other Treatments:     Therapy/Group: Individual Therapy  Tonny Branch 02/29/2020, 10:57 AM

## 2020-02-29 NOTE — Progress Notes (Signed)
[facility] PHYSICAL MEDICINE & REHABILITATION PROGRESS NOTE   Subjective/Complaints:   Pt reports bowels OK- finished breakfast- wants to go home, but admits isn't ready to leave hospital yet.       ROS:  Pt denies SOB, abd pain, CP, N/V/C/D, and vision changes   Objective:   No results found. Recent Labs    02/28/20 0539  WBC 8.1  HGB 15.7  HCT 46.0  PLT 164   Recent Labs    02/27/20 0351 02/28/20 0539  NA 138 137  K 4.0 4.3  CL 105 106  CO2 24 21*  GLUCOSE 92 87  BUN 13 15  CREATININE 0.99 1.08  CALCIUM 9.0 9.2    Intake/Output Summary (Last 24 hours) at 02/29/2020 1610 Last data filed at 02/29/2020 0630 Gross per 24 hour  Intake --  Output 1000 ml  Net -1000 ml     Physical Exam: Vital Signs Blood pressure 111/81, pulse (!) 56, temperature 98 F (36.7 C), resp. rate 18, height 6\' 2"  (1.88 m), weight 113 kg, SpO2 100 %.   Physical Exam  Nursing note and vitals reviewed. Constitutional:sitting upright in bed; breakfast completely done with tray/emptied, sad/affect, NAD HENT:  R gaze preference?  Neck:  (+) thyroid appears /feels enlarged CV: bradycardic regular rhythm Respiratory: CTA B/L- no W/R/R- good air movement GI: Soft, NT, ND, (+)BS  Musculoskeletal:     Comments: Left shoulder with limited ROM due to RTC pathology.  No edema  Neurological: Ox3 Less halting speech- more fluid than yesterday, but not normal yet Mild left facial weakness with dysarthria.  Able to follow commands without difficulty.  Motor: RUE/RLE: 5/5 proximal to distal LUE/LLE: 4+/5 proximal to distal with apraxia Sensation intact to light touch  Skin: Skin is warm and dry.  Psychiatric: slightly depressed affect  Assessment/Plan: 1. Functional deficits secondary to [place] with L hemiparesis  which require 3+ hours per day of interdisciplinary therapy in a comprehensive inpatient rehab setting.  Physiatrist is providing close team supervision and 24 hour management  of active medical problems listed below.  Physiatrist and rehab team continue to assess barriers to discharge/monitor patient progress toward functional and medical goals  Care Tool:  Bathing    Body parts bathed by patient: Right arm, Left arm, Chest, Abdomen, Front perineal area, Right upper leg, Buttocks, Left upper leg, Right lower leg, Left lower leg, Face         Bathing assist Assist Level: Contact Guard/Touching assist     Upper Body Dressing/Undressing Upper body dressing   What is the patient wearing?: Pull over shirt    Upper body assist Assist Level: Set up assist    Lower Body Dressing/Undressing Lower body dressing      What is the patient wearing?: Underwear/pull up, Pants     Lower body assist Assist for lower body dressing: Contact Guard/Touching assist     Toileting Toileting    Toileting assist Assist for toileting: Contact Guard/Touching assist Assistive Device Comment: (urinal)   Transfers Chair/bed transfer  Transfers assist     Chair/bed transfer assist level: Minimal Assistance - Patient > 75%     Locomotion Ambulation   Ambulation assist      Assist level: Minimal Assistance - Patient > 75% Assistive device: Walker-rolling Max distance: 205   Walk 10 feet activity   Assist     Assist level: Minimal Assistance - Patient > 75% Assistive device: Walker-rolling   Walk 50 feet activity   Assist Walk  50 feet with 2 turns activity did not occur: Safety/medical concerns(L hemiparesis, decreased balance, fatigue)  Assist level: Minimal Assistance - Patient > 75% Assistive device: Walker-rolling    Walk 150 feet activity   Assist Walk 150 feet activity did not occur: Safety/medical concerns(L hemiparesis, decreased balance, fatigue)  Assist level: Minimal Assistance - Patient > 75% Assistive device: Walker-rolling    Walk 10 feet on uneven surface  activity   Assist Walk 10 feet on uneven surfaces activity did  not occur: Safety/medical concerns(L hemiparesis, L foot drag, decreased balance, fatigue)         Wheelchair     Assist Will patient use wheelchair at discharge?: No Type of Wheelchair: Manual    Wheelchair assist level: Supervision/Verbal cueing Max wheelchair distance: 82ft    Wheelchair 50 feet with 2 turns activity    Assist        Assist Level: Moderate Assistance - Patient 50 - 74%   Wheelchair 150 feet activity     Assist      Assist Level: Maximal Assistance - Patient 25 - 49%   Blood pressure 111/81, pulse (!) 56, temperature 98 F (36.7 C), resp. rate 18, height 6\' 2"  (1.88 m), weight 113 kg, SpO2 100 %.  Medical Problem List and Plan: 1.  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             -patient may shower  4/15- pt also needs SLP for halting/dysarthria/speech  4/16- placed SLp referral yesterday             -ELOS/Goals: 5-9 days/Supervision/Mod I             Admit to [name] 2.  Antithrombotics: -DVT/anticoagulation:  Mechanical: Sequential compression devices, below knee Bilateral lower extremities             -antiplatelet therapy: N/a due to bleed  3. Chronic back pain/Pain Management: uses Oxymorphone 10 mg bid per Dr. Brien Few pain management--> MS contin 15 mg bid as substitute.  4/15- pain is well controlled- con't meds             Monitor with increased exertion 4. Mood: LCSW to follow for evaluation and support.              -antipsychotic agents: N/A 5. Neuropsych: This patient is capable of making decisions on his own behalf. 6. Skin/Wound Care: Routine pressure relief measures.  7. Fluids/Electrolytes/Nutrition: Monitor I/O. CMP ordered.  8. HTN: Monitor qid--continue Norvasc and Avapro. Orthostatic changes today with PT--encourage fluid intake.   4/15- not dehydrated based on labs- con't to monitor- might require ABd binder/TEDs- will see how he does with therapy.              Monitor with  increased mobility. 9.  Left thyroid nodule: Thyroid ultrasound on outpatient basis?   4/15- will let PCP know and arrange outpatient 10. Low grade fevers: Encourage IS--resolving. Continue to monitor vital signs/labs. 11. Barrett's esophagus: On PPI but still has intermittent issues with GERD. 12. Bradycardia  4/15- is baseline per pt- usually runs in 50s- asymptomatic- con't to monitor  4/16- pulse 56- is stable- con't regimen   LOS: 2 days A FACE TO FACE EVALUATION WAS PERFORMED  Shelie Lansing 02/29/2020, 4:10 PM

## 2020-03-01 ENCOUNTER — Inpatient Hospital Stay (HOSPITAL_COMMUNITY): Payer: Federal, State, Local not specified - PPO | Admitting: Occupational Therapy

## 2020-03-01 ENCOUNTER — Inpatient Hospital Stay (HOSPITAL_COMMUNITY): Payer: Federal, State, Local not specified - PPO | Admitting: Physical Therapy

## 2020-03-01 LAB — URINALYSIS, ROUTINE W REFLEX MICROSCOPIC
Bilirubin Urine: NEGATIVE
Glucose, UA: NEGATIVE mg/dL
Hgb urine dipstick: NEGATIVE
Ketones, ur: NEGATIVE mg/dL
Leukocytes,Ua: NEGATIVE
Nitrite: NEGATIVE
Protein, ur: NEGATIVE mg/dL
Specific Gravity, Urine: 1.01 (ref 1.005–1.030)
pH: 5 (ref 5.0–8.0)

## 2020-03-01 MED ORDER — NON FORMULARY: 40.0000 mg | Freq: Every day | Status: DC

## 2020-03-01 MED ORDER — TAMSULOSIN HCL 0.4 MG PO CAPS
0.4000 mg | ORAL_CAPSULE | Freq: Every day | ORAL | Status: DC
  Administered 2020-03-01 – 2020-03-06 (×6): 0.4 mg via ORAL
  Filled 2020-03-01 (×6): qty 1

## 2020-03-01 MED ORDER — ESOMEPRAZOLE MAGNESIUM 20 MG PO CPDR
40.0000 mg | DELAYED_RELEASE_CAPSULE | Freq: Every day | ORAL | Status: DC
  Administered 2020-03-01 – 2020-03-07 (×7): 40 mg via ORAL
  Filled 2020-03-01 (×2): qty 2
  Filled 2020-03-01: qty 1
  Filled 2020-03-01: qty 2
  Filled 2020-03-01: qty 1
  Filled 2020-03-01 (×4): qty 2

## 2020-03-01 NOTE — IPOC Note (Signed)
Overall Plan of Care [facility]) Patient Details Name: Richard Vaughn MRN: [id number] DOB: 05/25/1951  Admitting Diagnosis: ICH (intracerebral hemorrhage) [facility])  Hospital Problems: Principal Problem:   ICH (intracerebral hemorrhage) ([place]) - R thalmic/PLIC d/t HTN     Functional Problem List: Nursing Endurance  PT Balance, Endurance, Motor  OT Balance, Endurance, Motor  SLP Linguistic  TR         Basic ADL's: OT Grooming, Bathing, Dressing, Toileting     Advanced  ADL's: OT Simple Meal Preparation     Transfers: PT Bed Mobility, Bed to Chair, Car, Manufacturing systems engineer, Metallurgist: PT Ambulation, Emergency planning/management officer, Stairs     Additional Impairments: OT    SLP Communication expression    TR      Anticipated Outcomes Item Anticipated Outcome  Self Feeding independent  Swallowing      Basic self-care  set up/mod I  Toileting  supervision   Bathroom Transfers supervision  Bowel/Bladder  (Patient will manage B/B with min assist)  Transfers  Mod I with LRAD  Locomotion  Supervision with LRAD  Communication  Supervision A  Cognition     Pain  (Patient will remain under pain goal of 4)  Safety/Judgment  (Patient will remain free of falls during stay)   Therapy Plan: PT Intensity: Minimum of 1-2 x/day ,45 to 90 minutes PT Frequency: 5 out of 7 days PT Duration Estimated Length of Stay: 7-10 days OT Intensity: Minimum of 1-2 x/day, 45 to 90 minutes OT Frequency: 5 out of 7 days OT Duration/Estimated Length of Stay: 7-10 days SLP Intensity: Minumum of 1-2 x/day, 30 to 90 minutes SLP Frequency: 3 to 5 out of 7 days SLP Duration/Estimated Length of Stay: 7-10 days   Due to the current state of emergency, patients may not be receiving their 3-hours of Medicare-mandated therapy.   Team Interventions: Nursing Interventions Patient/Family Education  PT interventions Ambulation/gait training, Community reintegration, DME/adaptive equipment  instruction, Neuromuscular re-education, Psychosocial support, Stair training, UE/LE Strength taining/ROM, Wheelchair propulsion/positioning, Training and development officer, Discharge planning, Functional electrical stimulation, Pain management, Therapeutic Activities, UE/LE Coordination activities, Cognitive remediation/compensation, Disease management/prevention, Functional mobility training, Patient/family education, Splinting/orthotics, Therapeutic Exercise  OT Interventions Balance/vestibular training, Neuromuscular re-education, Self Care/advanced ADL retraining, Therapeutic Exercise, DME/adaptive equipment instruction, UE/LE Strength taining/ROM, Academic librarian, Barrister's clerk education, UE/LE Coordination activities, Discharge planning, Functional mobility training, Therapeutic Activities  SLP Interventions Cueing hierarchy, Functional tasks, Patient/family education, Speech/Language facilitation  TR Interventions    SW/CM Interventions Discharge Planning, Psychosocial Support, Patient/Family Education   Barriers to Discharge MD  Medical stability, Home enviroment access/loayout, Neurogenic bowel and bladder, Lack of/limited family support and Behavior  Nursing Decreased caregiver support    PT Decreased caregiver support per pt report, his wife is in and out and wouldn't be available to provide 24/7 care  OT      SLP      SW       Team Discharge Planning: Destination: PT-Home ,OT- Home , SLP-Home Projected Follow-up: PT-Home health PT, OT-  Outpatient OT, SLP-Outpatient SLP Projected Equipment Needs: PT-To be determined, OT- None recommended by OT, SLP-  Equipment Details: PT-Pt reports he has 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   Goals Supervision to mod I   See Team Conference Notes for weekly updates to the plan of care

## 2020-03-01 NOTE — Progress Notes (Signed)
Physical Therapy Session Note  Patient Details  Name: Richard Vaughn MRN: [id number] Date of Birth: June 15, 1951  Today's Date: 03/01/2020 PT Individual Time: 1640-1750 PT Individual Time Calculation (min): 70 min   Short Term Goals: Week 1:  PT Short Term Goal 1 (Week 1): STG=LTG due to LOS  Skilled Therapeutic Interventions/Progress Updates:   Pt received supine in bed and agreeable to PT. Supine>sit transfer with supervision assist and min cues for safety.   Sit<>stand with CGA and RW x 8 throughout treatment including from EOB, arm chair an recliner. Gait training through hall 2 x 167f with min assist and min-mod cues for step length/improved posture to prevent foot drag on the L.   BWSTT 3 min at .666m + 4 min at .2m42m min-mod assist from PT to improve step height/length on the LLE. Total of 475 ft.     PT instructed pt in dynamic balance training while engaged in fine motor task of Wii bowling. Pt able to sustain standing 10 frames and requried min assist from PT for safety. LUE support to encourage WB throughout on RW.   Patient returned to room and per RN request attempted to urinate. CGA from PT to maintain balance standing at RW. Pt unsuccessful to void, and left sitting in reclinre with call bell in reach and all needs met.          Therapy Documentation Precautions:  Precautions Precautions: Fall Restrictions Weight Bearing Restrictions: No    Vital Signs: Therapy Vitals Temp: 98.2 F (36.8 C) Pulse Rate: (!) 55 Resp: 16 BP: 101/84 Patient Position (if appropriate): Lying Oxygen Therapy SpO2: 96 % O2 Device: Room Air Pain: denies   Therapy/Group: Individual Therapy  AusLorie Phenix17/2021, 5:55 PM

## 2020-03-01 NOTE — Progress Notes (Signed)
Occupational Therapy Session Note  Patient Details  Name: Richard Vaughn MRN: [id number] Date of Birth: Oct 22, 1951  Today's Date: 03/01/2020 OT Individual Time: 9:15-10:00 (3)  1300-1415 OT Individual Time Calculation (min): 75 min    Short Term Goals: Week 1:  OT Short Term Goal 1 (Week 1): NA due to LOS - STG = LTG  Skilled Therapeutic Interventions/Progress Updates:    1:1 Pt sitting up at EOB when arrived. Pt participated in bathing at shower level.  Focus on functional mobility around the room with and without AD. PT ambulated to the bathroom with contact guard with the RW at a very slow pace. Pt showered sit to stand with contact guard during standing. Pt uses left Ue automatically in tasks with extra attention to it. Pt ambulated out of the shower with min A without RW with cues for larger step with left LE (tends to take a small step with left LE). PT able to dress sit to stand with contact guard with extra time.  2nd session 1:1 Therapeutic activity. Performed reciprocal scooting on the mat to bring himself back to the wall; sitting in long sitting. Performed stretches for his hamstrings. Scooted back out of EOM; due to back bothering him did not perform floor transfer today will plan for another session. In the hallway performed lateral stepping sliding a weighted yoga block to encourage picking up in left LE in one direction and prolonged isolated weight bearing in the other direction. Performed 3 times down the hallway. During seated rest break with heat on his back perform [facility] tasks with small beads, opening/ closing pill bottles, and paddle maze. Pt performed short distance functional ambulation without RW. Used RW to ambulate back to his room with theraband across the  Walker to help cue him to step further forward with his left LE with needing a verbal cue with contact guard.   Left pt resting int he bed.   Therapy Documentation Precautions:  Precautions Precautions:  Fall Restrictions Weight Bearing Restrictions: No General:   Vital Signs: Therapy Vitals Temp: 98.2 F (36.8 C) Pulse Rate: (!) 55 Resp: 16 BP: 101/84 Patient Position (if appropriate): Lying Oxygen Therapy SpO2: 96 % O2 Device: Room Air Pain:  none in first session  2nd session c/o back pain - applied moist heat in session with no reaction and allowed rest at end of session  Therapy/Group: Individual Therapy  Willeen Cass [facility] 03/01/2020, 3:32 PM

## 2020-03-01 NOTE — Progress Notes (Signed)
Multiple attempts were made to cath patient but he insisted that he wanted to try and void by himself. Patient educated on the important of cathing him. We continue to monitor.

## 2020-03-01 NOTE — Progress Notes (Addendum)
PHYSICAL MEDICINE & REHABILITATION PROGRESS NOTE   Subjective/Complaints:   Pt reports wasn't able to void last night- needed in/out caths- drinks "OK"- on and off- but just not voiding like normal.   Was cathed last night Of note, protonix isn't working- still having GERD Sx's on it, and also has barrett's esophagus.     ROS:  Pt denies SOB, abd pain, CP, N/V/C/D, and vision changes     Objective:   No results found. Recent Labs    02/28/20 0539  WBC 8.1  HGB 15.7  HCT 46.0  PLT 164   Recent Labs    02/28/20 0539  NA 137  K 4.3  CL 106  CO2 21*  GLUCOSE 87  BUN 15  CREATININE 1.08  CALCIUM 9.2    Intake/Output Summary (Last 24 hours) at 03/01/2020 1338 Last data filed at 03/01/2020 1100 Gross per 24 hour  Intake 120 ml  Output 900 ml  Net -780 ml     Physical Exam: Vital Signs Blood pressure 103/80, pulse (!) 54, temperature 98 F (36.7 C), resp. rate 16, height 6\' 2"  (1.88 m), weight 113 kg, SpO2 98 %.   Physical Exam  Nursing note and vitals reviewed. Constitutional:ssitting EOB, with sad/flat affect notable, NAD HENT:  R gaze preference?  Neck:  (+) thyroid appears /feels enlarged CV: bradycardic, regular rhythm Respiratory: CTA B/L- no W/R/R- good air movement GI: Soft, NT, ND, (+)BS  Musculoskeletal:     Comments: Left shoulder with limited ROM due to RTC pathology.  No edema  Neurological: Ox3 Speech more halting today than yesterday- also upset about cathing last night- since couldn't void.  Mild left facial weakness with dysarthria.  Able to follow commands without difficulty.  Motor: RUE/RLE: 5/5 proximal to distal LUE/LLE: 4+/5 proximal to distal with apraxia Sensation intact to light touch  Skin: Skin is warm and dry.  Psychiatric: more depressed affect  Assessment/Plan: 1. Functional deficits secondary to [place] with L hemiparesis  which require 3+ hours per day of interdisciplinary therapy in a comprehensive  inpatient rehab setting.  Physiatrist is providing close team supervision and 24 hour management of active medical problems listed below.  Physiatrist and rehab team continue to assess barriers to discharge/monitor patient progress toward functional and medical goals  Care Tool:  Bathing    Body parts bathed by patient: Right arm, Left arm, Chest, Abdomen, Front perineal area, Right upper leg, Buttocks, Left upper leg, Right lower leg, Left lower leg, Face         Bathing assist Assist Level: Contact Guard/Touching assist     Upper Body Dressing/Undressing Upper body dressing   What is the patient wearing?: Pull over shirt    Upper body assist Assist Level: Set up assist    Lower Body Dressing/Undressing Lower body dressing      What is the patient wearing?: Underwear/pull up, Pants     Lower body assist Assist for lower body dressing: Contact Guard/Touching assist     Toileting Toileting    Toileting assist Assist for toileting: Contact Guard/Touching assist Assistive Device Comment: (urinal)   Transfers Chair/bed transfer  Transfers assist     Chair/bed transfer assist level: Contact Guard/Touching assist     Locomotion Ambulation   Ambulation assist      Assist level: Minimal Assistance - Patient > 75% Assistive device: Walker-rolling Max distance: 205   Walk 10 feet activity   Assist     Assist level: Minimal Assistance - Patient >  75% Assistive device: Walker-rolling   Walk 50 feet activity   Assist Walk 50 feet with 2 turns activity did not occur: Safety/medical concerns(L hemiparesis, decreased balance, fatigue)  Assist level: Minimal Assistance - Patient > 75% Assistive device: Walker-rolling    Walk 150 feet activity   Assist Walk 150 feet activity did not occur: Safety/medical concerns(L hemiparesis, decreased balance, fatigue)  Assist level: Minimal Assistance - Patient > 75% Assistive device: Walker-rolling    Walk 10  feet on uneven surface  activity   Assist Walk 10 feet on uneven surfaces activity did not occur: Safety/medical concerns(L hemiparesis, L foot drag, decreased balance, fatigue)         Wheelchair     Assist Will patient use wheelchair at discharge?: No Type of Wheelchair: Manual    Wheelchair assist level: Supervision/Verbal cueing Max wheelchair distance: 64ft    Wheelchair 50 feet with 2 turns activity    Assist        Assist Level: Moderate Assistance - Patient 50 - 74%   Wheelchair 150 feet activity     Assist      Assist Level: Maximal Assistance - Patient 25 - 49%   Blood pressure 103/80, pulse (!) 54, temperature 98 F (36.7 C), resp. rate 16, height 6\' 2"  (1.88 m), weight 113 kg, SpO2 98 %.  Medical Problem List and Plan: 1.  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             -patient may shower  4/15- pt also needs SLP for halting/dysarthria/speech  4/16- placed SLp referral yesterday             -ELOS/Goals: 5-9 days/Supervision/Mod I             Admit to [name] 2.  Antithrombotics: -DVT/anticoagulation:  Mechanical: Sequential compression devices, below knee Bilateral lower extremities             -antiplatelet therapy: N/a due to bleed  3. Chronic back pain/Pain Management: uses Oxymorphone 10 mg bid per Dr. Brien Few pain management--> MS contin 15 mg bid as substitute.  4/15- pain is well controlled- con't meds             Monitor with increased exertion 4. Mood: LCSW to follow for evaluation and support.              -antipsychotic agents: N/A 5. Neuropsych: This patient is capable of making decisions on his own behalf. 6. Skin/Wound Care: Routine pressure relief measures.  7. Fluids/Electrolytes/Nutrition: Monitor I/O. CMP ordered.  8. HTN: Monitor qid--continue Norvasc and Avapro. Orthostatic changes today with PT--encourage fluid intake.   4/15- not dehydrated based on labs- con't to monitor- might  require ABd binder/TEDs- will see how he does with therapy.   4/17- BP soft 1053/80- will see if orthostatic hypotensive. Also, sinc starting Flomax, will stop Norvasc.              Monitor with increased mobility. 9.  Left thyroid nodule: Thyroid ultrasound on outpatient basis?   4/15- will let PCP know and arrange outpatient 10. Low grade fevers: Encourage IS--resolving. Continue to monitor vital signs/labs. 11. Barrett's esophagus: On PPI but still has intermittent issues with GERD.  4/17- will change to Nexium/Esomeprazole 40 mg daily since pt still having GERD Sx's/GI pain. 12. Bradycardia  4/15- is baseline per pt- usually runs in 50s- asymptomatic- con't to monitor  4/16- pulse 56- is stable- con't regimen 13. Urinary retention  4/17-  wil start Flomax 0.4 mg nightly to get pt to void- also encouraged him to con't to drink. Will also check U/A and Cx just to make sure no UTI.    LOS: 3 days A FACE TO FACE EVALUATION WAS PERFORMED  Duel Conrad 03/01/2020, 1:38 PM

## 2020-03-02 ENCOUNTER — Inpatient Hospital Stay (HOSPITAL_COMMUNITY): Payer: Federal, State, Local not specified - PPO

## 2020-03-02 NOTE — Progress Notes (Signed)
[facility] PHYSICAL MEDICINE & REHABILITATION PROGRESS NOTE   Subjective/Complaints:   Pt reports no more caths, but nursing notes report, attempted to cath pt multiple times (doesn't say the Bladder scan volumes noted). Last night, but pt refused.   Pt said didn't "require" caths- after flomax last night- only voiding volumes documented, so cannot tell if pt needed cathing.   Bowels last moved 2 days ago- "normal for him". Doesn't want laxative.   ROS:  Pt denies SOB, abd pain, CP, N/V/C/D, and vision changes    Objective:   No results found. No results for input(s): WBC, HGB, HCT, PLT in the last 72 hours. No results for input(s): NA, K, CL, CO2, GLUCOSE, BUN, CREATININE, CALCIUM in the last 72 hours.  Intake/Output Summary (Last 24 hours) at 03/02/2020 1512 Last data filed at 03/02/2020 1155 Gross per 24 hour  Intake 462 ml  Output 1625 ml  Net -1163 ml     Physical Exam: Vital Signs Blood pressure 115/80, pulse 75, temperature 98.4 F (36.9 C), resp. rate 18, height 6\' 2"  (1.88 m), weight 113 kg, SpO2 94 %.   Physical Exam  Nursing note and vitals reviewed. Constitutional: sitting up in bed; appropriate, flat, depressed affect, smiled occ, NAD HENT:  R gaze preference?  Neck:  (+) thyroid appears /feels enlarged CV: RRR_ no JVD Respiratory: CTA B/L- no W/R/R- good air movement GI: Soft, NT, ND, (+)BS  Musculoskeletal:     Comments: Left shoulder with limited ROM due to RTC pathology.  No edema  Neurological: Ox3 Speech more halting today than yesterday- also upset about cathing last night- since couldn't void.  Mild left facial weakness with significant dysarthria, but can understand him  Able to follow commands without difficulty.  Motor: RUE/RLE: 5/5 proximal to distal LUE/LLE: 4+/5 proximal to distal with apraxia Sensation intact to light touch  Skin: Skin is warm and dry.  Psychiatric: depressed affect  Assessment/Plan: 1. Functional deficits  secondary to [place] with L hemiparesis  which require 3+ hours per day of interdisciplinary therapy in a comprehensive inpatient rehab setting.  Physiatrist is providing close team supervision and 24 hour management of active medical problems listed below.  Physiatrist and rehab team continue to assess barriers to discharge/monitor patient progress toward functional and medical goals  Care Tool:  Bathing    Body parts bathed by patient: Right arm, Left arm, Chest, Abdomen, Front perineal area, Right upper leg, Buttocks, Left upper leg, Right lower leg, Left lower leg, Face         Bathing assist Assist Level: Contact Guard/Touching assist     Upper Body Dressing/Undressing Upper body dressing   What is the patient wearing?: Pull over shirt    Upper body assist Assist Level: Set up assist    Lower Body Dressing/Undressing Lower body dressing      What is the patient wearing?: Underwear/pull up, Pants     Lower body assist Assist for lower body dressing: Contact Guard/Touching assist     Toileting Toileting    Toileting assist Assist for toileting: Contact Guard/Touching assist Assistive Device Comment: urinal   Transfers Chair/bed transfer  Transfers assist     Chair/bed transfer assist level: Contact Guard/Touching assist     Locomotion Ambulation   Ambulation assist      Assist level: Minimal Assistance - Patient > 75% Assistive device: Walker-rolling Max distance: 205   Walk 10 feet activity   Assist     Assist level: Minimal Assistance - Patient >  75% Assistive device: Walker-rolling   Walk 50 feet activity   Assist Walk 50 feet with 2 turns activity did not occur: Safety/medical concerns(L hemiparesis, decreased balance, fatigue)  Assist level: Minimal Assistance - Patient > 75% Assistive device: Walker-rolling    Walk 150 feet activity   Assist Walk 150 feet activity did not occur: Safety/medical concerns(L hemiparesis, decreased  balance, fatigue)  Assist level: Minimal Assistance - Patient > 75% Assistive device: Walker-rolling    Walk 10 feet on uneven surface  activity   Assist Walk 10 feet on uneven surfaces activity did not occur: Safety/medical concerns(L hemiparesis, L foot drag, decreased balance, fatigue)         Wheelchair     Assist Will patient use wheelchair at discharge?: No Type of Wheelchair: Manual    Wheelchair assist level: Supervision/Verbal cueing Max wheelchair distance: 54ft    Wheelchair 50 feet with 2 turns activity    Assist        Assist Level: Moderate Assistance - Patient 50 - 74%   Wheelchair 150 feet activity     Assist      Assist Level: Maximal Assistance - Patient 25 - 49%   Blood pressure 115/80, pulse 75, temperature 98.4 F (36.9 C), resp. rate 18, height 6\' 2"  (1.88 m), weight 113 kg, SpO2 94 %.  Medical Problem List and Plan: 1.  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             -patient may shower  4/15- pt also needs SLP for halting/dysarthria/speech  4/16- placed SLp referral yesterday             -ELOS/Goals: 5-9 days/Supervision/Mod I             Admit to [name] 2.  Antithrombotics: -DVT/anticoagulation:  Mechanical: Sequential compression devices, below knee Bilateral lower extremities             -antiplatelet therapy: N/a due to bleed  3. Chronic back pain/Pain Management: uses Oxymorphone 10 mg bid per Dr. Brien Few pain management--> MS contin 15 mg bid as substitute.  4/15- pain is well controlled- con't meds             Monitor with increased exertion 4. Mood: LCSW to follow for evaluation and support.              -antipsychotic agents: N/A 5. Neuropsych: This patient is capable of making decisions on his own behalf. 6. Skin/Wound Care: Routine pressure relief measures.  7. Fluids/Electrolytes/Nutrition: Monitor I/O. CMP ordered.  8. HTN: Monitor qid--continue Norvasc and Avapro.  Orthostatic changes today with PT--encourage fluid intake.   4/15- not dehydrated based on labs- con't to monitor- might require ABd binder/TEDs- will see how he does with therapy.   4/17- BP soft 103/80- will see if orthostatic hypotensive. Also, sinc starting Flomax, will stop Norvasc.   4/18- BP 110s/70s- better with norvasc stopped             Monitor with increased mobility. 9.  Left thyroid nodule: Thyroid ultrasound on outpatient basis?   4/15- will let PCP know and arrange outpatient 10. Low grade fevers: Encourage IS--resolving. Continue to monitor vital signs/labs. 11. Barrett's esophagus: On PPI but still has intermittent issues with GERD.  4/17- will change to Nexium/Esomeprazole 40 mg daily since pt still having GERD Sx's/GI pain. 12. Bradycardia  4/15- is baseline per pt- usually runs in 50s- asymptomatic- con't to monitor  4/16- pulse 56- is stable-  con't regimen  4/18- pulse up to mid 70s today- con't regimen 13. Urinary retention  4/17- wil start Flomax 0.4 mg nightly to get pt to void- also encouraged him to con't to drink. Will also check U/A and Cx just to make sure no UTI.   4/18- no UTI based on U/A done- not sure if pt needed cathing or not overnight- just saw note saying he needed it, but no numbers documented- will con't flomax and encourage pt to get cathed if needed.   LOS: 4 days A FACE TO FACE EVALUATION WAS PERFORMED  Richard Vaughn 03/02/2020, 3:12 PM

## 2020-03-03 ENCOUNTER — Inpatient Hospital Stay (HOSPITAL_COMMUNITY): Payer: Federal, State, Local not specified - PPO

## 2020-03-03 ENCOUNTER — Inpatient Hospital Stay (HOSPITAL_COMMUNITY): Payer: Federal, State, Local not specified - PPO | Admitting: Occupational Therapy

## 2020-03-03 ENCOUNTER — Encounter (HOSPITAL_COMMUNITY): Payer: Federal, State, Local not specified - PPO | Admitting: Psychology

## 2020-03-03 ENCOUNTER — Inpatient Hospital Stay (HOSPITAL_COMMUNITY): Payer: Federal, State, Local not specified - PPO | Admitting: Speech Pathology

## 2020-03-03 DIAGNOSIS — F330 Major depressive disorder, recurrent, mild: Secondary | ICD-10-CM

## 2020-03-03 DIAGNOSIS — G894 Chronic pain syndrome: Secondary | ICD-10-CM

## 2020-03-03 DIAGNOSIS — I10 Essential (primary) hypertension: Secondary | ICD-10-CM

## 2020-03-03 DIAGNOSIS — R339 Retention of urine, unspecified: Secondary | ICD-10-CM | POA: Insufficient documentation

## 2020-03-03 DIAGNOSIS — G479 Sleep disorder, unspecified: Secondary | ICD-10-CM

## 2020-03-03 DIAGNOSIS — R001 Bradycardia, unspecified: Secondary | ICD-10-CM

## 2020-03-03 DIAGNOSIS — F329 Major depressive disorder, single episode, unspecified: Secondary | ICD-10-CM

## 2020-03-03 DIAGNOSIS — I619 Nontraumatic intracerebral hemorrhage, unspecified: Secondary | ICD-10-CM

## 2020-03-03 LAB — BASIC METABOLIC PANEL
Anion gap: 9 (ref 5–15)
BUN: 14 mg/dL (ref 8–23)
CO2: 26 mmol/L (ref 22–32)
Calcium: 9.2 mg/dL (ref 8.9–10.3)
Chloride: 104 mmol/L (ref 98–111)
Creatinine, Ser: 1.14 mg/dL (ref 0.61–1.24)
GFR calc Af Amer: >60 mL/min (ref >60)
GFR calc non Af Amer: >60 mL/min (ref >60)
Glucose, Bld: 106 mg/dL — ABNORMAL HIGH (ref 70–99)
Potassium: 3.8 mmol/L (ref 3.5–5.1)
Sodium: 139 mmol/L (ref 135–145)

## 2020-03-03 LAB — URINE CULTURE: Culture: NO GROWTH

## 2020-03-03 MED ORDER — MELATONIN 3 MG PO TABS
1.5000 mg | ORAL_TABLET | Freq: Every day | ORAL | Status: DC
  Administered 2020-03-03 – 2020-03-06 (×4): 1.5 mg via ORAL
  Filled 2020-03-03 (×4): qty 1

## 2020-03-03 MED ORDER — NON FORMULARY: 1.5000 mg | Freq: Every day | Status: DC

## 2020-03-03 NOTE — Progress Notes (Signed)
Physical Therapy Session Note  Patient Details  Name: ZAIDIN SKIVER MRN: [id number] Date of Birth: 1951/10/13  Today's Date: 03/03/2020 PT Individual Time: 1007-1102 PT Individual Time Calculation (min): 55 min   Short Term Goals: Week 1:  PT Short Term Goal 1 (Week 1): STG=LTG due to LOS  Skilled Therapeutic Interventions/Progress Updates:     Patient in recliner upon PT arrival. Patient alert and agreeable to PT session. Patient denied pain during session.  Therapeutic Activity: Transfers: Patient performed sit to/from stand x7 with min A-CGA. Provided verbal cues for hand placement, scooting forward and forward weight shift.  Gait Training:  Patient ambulated 155 feet, 231 feet, 105 feet and 126 feet using RW and 15 feet without a RW with min A-CGA. Ambulated with decreased gait speed, decreased L step height and foot clearance, decreased R weight shift, and mild L inattention. Provided verbal cues for increased L step height with external cues and manual facilitation for R weight shift. Focused on repetition using blocked practice for motor learning and reduction of external feedback each trial.  Neuromuscular Re-ed: Patient performed the following LE motor learning activities: Alternating step taps with B UE support for focus on weight shift to stance leg and L hip flexion and DF 2x20. Required CGA progressing to supervision during activity for safety/balance. Performed between ambulation trials with improved L foot clearance following activity.   Therapeutic Exercise: Patient performed the following exercises with verbal and tactile cues for proper technique. -scapular retractions 2x10 with 5 sec holds  Patient in recliner at end of session with breaks locked, seat belt alarm set, and all needs within reach.    Therapy Documentation Precautions:  Precautions Precautions: Fall Restrictions Weight Bearing Restrictions: No    Therapy/Group: Individual Therapy  Elijiah Mickley L  Field Staniszewski PT, DPT  03/03/2020, 3:22 PM

## 2020-03-03 NOTE — Consult Note (Signed)
Neuropsychological Consultation   Patient:   Richard Vaughn   DOB:   10-Jul-1951  MR Number:  [id number]  Location:  [facility] [id number] [place] [address] Dept: [place]: [phone]           Date of Service:   03/04/2019  Start Time:   9 AM End Time:   10 AM  Provider/Observer:  Ilean Skill, Psy.D.       Clinical Neuropsychologist       Billing Code/Service: [id number]  Chief Complaint:    Richard Bence.  Vaughn is a 69 year old male who has a history of Barrett's esophagus, hypertension, chronic pain, hypertensive bleeds, multiple Lacunar bleeds, OSA.  The patient was admitted on 02/22/2020 with headaches, visual changes, left hemiparesis, and slurred speech.  CT head showed acute right thalamic hemorrhage with mild surrounding edema and minimal mass-effect as well as events chronic small vessel disease.  Patient has had left lower extremity weakness with ataxia, dysarthria and left upper extremity weakness affecting his ability for self-care.  Comprehensive inpatient rehabilitation was recommended due to functional decline.  Reason for Service:  The patient was referred for neuropsychological consultation due to coping and adjustment issues.  The patient has been very tearful at times and questioning what he has done wrong to cause these to continue to happen.  Below is the HPI for the current admission.  HPI: Richard Vaughn is a 69 year old male with history of Barrett's esophagus with GERD/ hx of esophageal stricture, HTN, chronic pain--on opana, hypertensive bleeds--last 04/2018, multiple lacunar bleeds, OSA who was admitted on 02/22/2020 with headache, visual changes, left hemiparesis, and slurred speech. CT head showed acute right thalamic hemorrhage with mild surrounding edema and min mas effect as well as advanced chronic small vessel disease. CTA head/neck negative for LVO and 14 mm  left thyroid nodule noted with recommendations for dedicated thyroid ultrasound.  Echocardiogram with EF of 60-65% with no wall abnormality and moderate LVH. 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 Dr. 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 Hospital course further complicated by low grade fever on 4/12, ? due to viral bronchopneumonia. Therapy ongoing and patient limited by LLE weakness with ataxia, dysarthria and LUE weakness affecting ADLs. [name] recommended due to functional decline. Please see preadmission assessment from earlier today as well.    Current Status:  The patient was oriented today and was aware of what all that happened today with him as far as his most recent bleed in past bleeds.  The patient was dysarthric and sitting the whole time but appears to be weak with regard to left motor function.  The patient acknowledged being tearful and upset and trying to figure out what he may have done to cause these to happen.  We reviewed these issues and worked on coping with acute loss of function as well as chronic effects.  The patient reports that he recovered fairly well from the past cerebrovascular hemorrhages.  The patient was tearful but appeared to be appropriate to particular subject matter being discussed and issues with loss of function.  Behavioral Observation: Richard Vaughn  presents as a 69 y.o.-year-old Right African American Male who appeared his stated age. his dress was  Appropriate and he was Well Groomed and his manners were Appropriate to the situation.  his participation was indicative of Appropriate and Redirectable behaviors.  There were any physical disabilities noted.  he displayed an appropriate level of cooperation and motivation.     Interactions:    Active Appropriate and  Redirectable  Attention:   abnormal and attention span appeared shorter than expected for age  Memory:   within normal limits; recent and remote memory intact  Visuo-spatial:  not examined  Speech (Volume):  low  Speech:   slurred; garbled  Thought Process:  Coherent and Relevant  Though Content:  Rumination; not suicidal and not homicidal  Orientation:   person, place, time/date and situation  Judgment:   Fair  Planning:   Poor  Affect:    Depressed and Tearful  Mood:    Dysphoric  Insight:   Fair  Intelligence:   normal  Medical History:   Past Medical History:  Diagnosis Date  . Barrett's esophagus   . Chronic back pain   . Gastritis    Mild  . Gastroparesis   . GERD (gastroesophageal reflux disease)   . Headache   . Hyperglycemia   . Hypertension   . Nephrolithiasis   . Renal cyst   . Stricture and stenosis of esophagus   . Stroke ([place]) 07-2015  . Vision abnormalities    Psychiatric History:  No prior psychiatric history  Family Med/Psych History:  Family History  Problem Relation Age of Onset  . Hypertension Father   . Stroke Father   . Hypertension Mother   . Liver cancer Mother   . Hypertension Sister        2 other sisters has also  . Stroke Sister   . Stroke Sister   . Hypertension Brother        2nd brother has also  . Colon cancer Neg Hx   . Esophageal cancer Neg Hx   . Rectal cancer Neg Hx   . Stomach cancer Neg Hx   . Colon polyps Neg Hx    Impression/DX:  Richard Bence.  Vaughn is a 69 year old male who has a history of Barrett's esophagus, hypertension, chronic pain, hypertensive bleeds, multiple Lacunar bleeds, OSA.  The patient was admitted on 02/22/2020 with headaches, visual changes, left hemiparesis, and slurred speech.  CT head showed acute right thalamic hemorrhage with mild surrounding edema and minimal mass-effect as well as events chronic small vessel disease.  Patient has had left lower extremity weakness with ataxia, dysarthria  and left upper extremity weakness affecting his ability for self-care.  Comprehensive inpatient rehabilitation was recommended due to functional decline.  The patient was oriented today and was aware of what all that happened today with him as far as his most recent bleed in past bleeds.  The patient was dysarthric and sitting the whole time but appears to be weak with regard to left motor function.  The patient acknowledged being tearful and upset and trying to figure out what he may have done to cause these to happen.  We reviewed these issues and worked on coping with acute loss of function as well as chronic effects.  The patient reports that he recovered fairly well from the past cerebrovascular hemorrhages.  The patient was tearful but appeared to be appropriate to particular subject matter being discussed and issues with loss of function.  Disposition/Plan:  Today we worked on Radiographer, therapeutic and adjustment surrounding dealing with not only the current thalamic hemorrhage as  well as prior cerebrovascular events and fear of future cerebrovascular events.  I will follow up with the patient later this week.  He is having depressive reaction to his recurrent neurological events.  Diagnosis:    Right thalamic hemorrhage and depressive reaction        Electronically Signed   _______________________ Ilean Skill, Psy.D.

## 2020-03-03 NOTE — Progress Notes (Signed)
PHYSICAL MEDICINE & REHABILITATION PROGRESS NOTE   Subjective/Complaints: Patient seen laying in bed this morning.  He states he did not sleep well overnight because his mind is racing.  He is interested in medication adjustments.  ROS: Denies CP, SOB, N/V/D  Objective:   No results found. No results for input(s): WBC, HGB, HCT, PLT in the last 72 hours. Recent Labs    03/03/20 0721  NA 139  K 3.8  CL 104  CO2 26  GLUCOSE 106*  BUN 14  CREATININE 1.14  CALCIUM 9.2    Intake/Output Summary (Last 24 hours) at 03/03/2020 1252 Last data filed at 03/03/2020 [id number] Gross per 24 hour  Intake 720 ml  Output 750 ml  Net -30 ml     Physical Exam: Vital Signs Blood pressure 101/77, pulse (!) 54, temperature 98.1 F (36.7 C), resp. rate 15, height 6\' 2"  (1.88 m), weight 113 kg, SpO2 97 %. Constitutional: No distress . Vital signs reviewed. HENT: Normocephalic.  Atraumatic. Eyes: EOMI. No discharge. Cardiovascular: No JVD. Respiratory: Normal effort.  No stridor. GI: Non-distended. Skin: Warm and dry.  Intact. Psych: Normal mood.  Normal behavior. Musc: Right shoulder limited range of motion due to RTC injury (baseline) Neurological: Alert. Mild left facial weakness with dysarthria Able to follow commands without difficulty.  Motor: RUE/RLE: 5/5 proximal to distal LUE/LLE: 4+-5/5 proximal to distal with apraxia  Assessment/Plan: 1. Functional deficits secondary to [place] with L hemiparesis  which require 3+ hours per day of interdisciplinary therapy in a comprehensive inpatient rehab setting.  Physiatrist is providing close team supervision and 24 hour management of active medical problems listed below.  Physiatrist and rehab team continue to assess barriers to discharge/monitor patient progress toward functional and medical goals  Care Tool:  Bathing    Body parts bathed by patient: Right arm, Left arm, Chest, Abdomen, Front perineal area, Right upper leg,  Buttocks, Left upper leg, Right lower leg, Left lower leg, Face         Bathing assist Assist Level: Supervision/Verbal cueing     Upper Body Dressing/Undressing Upper body dressing   What is the patient wearing?: Pull over shirt    Upper body assist Assist Level: Set up assist    Lower Body Dressing/Undressing Lower body dressing      What is the patient wearing?: Underwear/pull up, Pants     Lower body assist Assist for lower body dressing: Supervision/Verbal cueing     Toileting Toileting    Toileting assist Assist for toileting: Contact Guard/Touching assist Assistive Device Comment: urinal   Transfers Chair/bed transfer  Transfers assist     Chair/bed transfer assist level: Contact Guard/Touching assist     Locomotion Ambulation   Ambulation assist      Assist level: Minimal Assistance - Patient > 75% Assistive device: Walker-rolling Max distance: 231'   Walk 10 feet activity   Assist     Assist level: Minimal Assistance - Patient > 75% Assistive device: Hand held assist   Walk 50 feet activity   Assist Walk 50 feet with 2 turns activity did not occur: Safety/medical concerns(L hemiparesis, decreased balance, fatigue)  Assist level: Minimal Assistance - Patient > 75% Assistive device: Walker-rolling    Walk 150 feet activity   Assist Walk 150 feet activity did not occur: Safety/medical concerns(L hemiparesis, decreased balance, fatigue)  Assist level: Minimal Assistance - Patient > 75% Assistive device: Walker-rolling    Walk 10 feet on uneven surface  activity  Assist Walk 10 feet on uneven surfaces activity did not occur: Safety/medical concerns(L hemiparesis, L foot drag, decreased balance, fatigue)         Wheelchair     Assist Will patient use wheelchair at discharge?: No Type of Wheelchair: Manual    Wheelchair assist level: Supervision/Verbal cueing Max wheelchair distance: 70ft    Wheelchair 50 feet  with 2 turns activity    Assist        Assist Level: Moderate Assistance - Patient 50 - 74%   Wheelchair 150 feet activity     Assist      Assist Level: Maximal Assistance - Patient 25 - 49%   Blood pressure 101/77, pulse (!) 54, temperature 98.1 F (36.7 C), resp. rate 15, height 6\' 2"  (1.88 m), weight 113 kg, SpO2 97 %.  Medical Problem List and Plan: 1.  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   Continue [name] 2.  Antithrombotics: -DVT/anticoagulation:  Mechanical: Sequential compression devices, below knee Bilateral lower extremities             -antiplatelet therapy: N/a due to bleed  3. Chronic back pain/Pain Management: uses Oxymorphone 10 mg bid per Dr. Brien Few pain management--> MS contin 15 mg bid as substitute.  Controlled with meds on 4/19   Monitor with increased exertion 4. Mood: LCSW to follow for evaluation and support.              -antipsychotic agents: N/A 5. Neuropsych: This patient is capable of making decisions on his own behalf.  Neuropsych consulted 6. Skin/Wound Care: Routine pressure relief measures.  7. Fluids/Electrolytes/Nutrition: Monitor I/O.   8. HTN: Monitor qid--continue Avapro.   Norvasc DC'd  Controlled on 4/19             Monitor with increased mobility. 9.  Left thyroid nodule: Thyroid ultrasound on outpatient basis?   4/15- will let PCP know and arrange outpatient 10. Low grade fevers: Resolved encourage IS. Continue to monitor vital signs/labs. 11. Barrett's esophagus: On PPI but still has intermittent issues with GERD.  Changed to Nexium/Esomeprazole 40 mg daily since pt still having GERD Sx's/GI pain. 12. Bradycardia  Borderline/stable on 4/19 13. Urinary retention  Continue Flomax 0.4 mg nightly to get pt to void  I/O caths as necessary 14.  Sleep disturbance  Appears to be related to anxiety/depression  Continue trazodone  Melatonin started on 4/19  LOS: 5 days A FACE TO FACE  EVALUATION WAS PERFORMED  Lindzee Gouge Lorie Phenix 03/03/2020, 12:52 PM

## 2020-03-03 NOTE — Progress Notes (Signed)
Speech Language Pathology Daily Session Note  Patient Details  Name: Richard Vaughn MRN: [id number] Date of Birth: Dec 13, 1950  Today's Date: 03/03/2020 SLP Individual Time: 1445-1530 SLP Individual Time Calculation (min): 45 min  Short Term Goals: Week 1: SLP Short Term Goal 1 (Week 1): Pt will implement increased vocal intensity and overarticulation strategies during sentence and conversation level tasks with Min A cues. SLP Short Term Goal 2 (Week 1): Pt will achieve 85% intelligibility in conversation with Min A multimodal cues for speech intelligibility strategies.  Skilled Therapeutic Interventions: Pt was seen for skilled ST intervention targeting aforementioned goals. Pt was pleasant and cooperative with unfamiliar therapist. SLP facilitated session by reviewing strategies for improving intelligibility, including decreasing rate, overarticulation, and increased loudness. Provided education regarding breath support to maximize loudness. Pt indicated he does not see the progress everyone says he is making. SLP provided a spiral bound notebook to journal therapy activities and speech strategies to increase functional recall of information, and therapy activities, as well as to increase his awareness of his progress.  Pt was left in recliner with alarm on, all needs within reach. Continue ST per current plan of care.   Pain Pain Assessment Pain Scale: Faces Pain Score: 4  Faces Pain Scale: Hurts little more Pain Type: Chronic pain Pain Location: Back Pain Orientation: Lower Pain Descriptors / Indicators: Discomfort Pain Onset: On-going Patients Stated Pain Goal: 2 Pain Intervention(s): Repositioned  Therapy/Group: Individual Therapy   Landry Lookingbill B. Quentin Ore, [facility], CCC-SLP Speech Language Pathologist  Shonna Chock 03/03/2020, 3:52 PM

## 2020-03-03 NOTE — Progress Notes (Signed)
Occupational Therapy Session Note  Patient Details  Name: Richard Vaughn MRN: [id number] Date of Birth: Apr 05, 1951  Today's Date: 03/03/2020 OT Individual Time: [id number]  &  1300-1345 OT Individual Time Calculation (min): 62 min & 45 min   Short Term Goals: Week 1:  OT Short Term Goal 1 (Week 1): NA due to LOS - STG = LTG  Skilled Therapeutic Interventions/Progress Updates:    AM session:   Patient seated in bed, alert and ready for therapy session.  He continues with dysarthric speech but is understood when he slows rate.  Patient with periods of tearfulness t/o session.  He is grateful for neuropsych appointment this am.  Sit to stand and short distance ambulation in room with RW CGA/CS.  He completed bathing tasks standing at sink with CS.  Standing tolerance > 10 minutes.  UB dressing CS in stance.  Grooming tasks CS in stance.  LB dressing seated in w/c with CS, min cues.  Reviewed coping strategies that he has used in the past.  He ambulated with RW to recliner with CGA where he remained at close of session with seat belt alarm set and call bell in reach.     PM session:   Patient seated in recliner.  He denies pain.  Sit to stand with CGA from low recliner surface.  He ambulated to therapy gym with CS/CGA.  Completed towel slides on wall surface - mild sh pain.  Completed seated and standing UB conditioning and AROM ex (I/T/Y in stance) sh rows seated with theraband - did not do additional theraband ex with left sh due to pain.  Ambulation back to room with RW CGA.  Completed standing balance/LB AROM ex with CGA.  He returned to bed at close of session, bed alarm set and ice pack provided for left shoulder.  Call bell and tray table in reach.    Therapy Documentation Precautions:  Precautions Precautions: Fall Restrictions Weight Bearing Restrictions: No  Pain: Pain Assessment Pain Scale: 0-10 Pain Score: 4  Pain Type: Chronic pain Pain Location: Back Pain Orientation:  Lower Pain Descriptors / Indicators: Discomfort Pain Intervention(s): Repositioned  Therapy/Group: Individual Therapy  Carlos Levering 03/03/2020, 12:06 PM

## 2020-03-04 ENCOUNTER — Inpatient Hospital Stay (HOSPITAL_COMMUNITY): Payer: Federal, State, Local not specified - PPO | Admitting: Occupational Therapy

## 2020-03-04 ENCOUNTER — Inpatient Hospital Stay (HOSPITAL_COMMUNITY): Payer: Federal, State, Local not specified - PPO | Admitting: Speech Pathology

## 2020-03-04 ENCOUNTER — Inpatient Hospital Stay (HOSPITAL_COMMUNITY): Payer: Federal, State, Local not specified - PPO

## 2020-03-04 MED ORDER — AMANTADINE HCL 100 MG PO CAPS
100.0000 mg | ORAL_CAPSULE | Freq: Every day | ORAL | Status: DC
  Administered 2020-03-04 – 2020-03-06 (×3): 100 mg via ORAL
  Filled 2020-03-04 (×3): qty 1

## 2020-03-04 MED ORDER — TRAZODONE HCL 50 MG PO TABS
150.0000 mg | ORAL_TABLET | Freq: Every day | ORAL | Status: DC
  Administered 2020-03-04 – 2020-03-06 (×3): 150 mg via ORAL
  Filled 2020-03-04 (×3): qty 3

## 2020-03-04 NOTE — Plan of Care (Signed)
  Problem: Consults Goal: RH GENERAL PATIENT EDUCATION Description: See Patient Education module for education specifics. Outcome: Progressing   Problem: RH SKIN INTEGRITY Goal: RH STG SKIN FREE OF INFECTION/BREAKDOWN Description: Skin to remain free of breakdown during rehab stay Outcome: Progressing Goal: RH STG MAINTAIN SKIN INTEGRITY WITH ASSISTANCE Description: STG Maintain Skin Integrity With  min Assistance. Outcome: Progressing   Problem: RH SAFETY Goal: RH STG ADHERE TO SAFETY PRECAUTIONS W/ASSISTANCE/DEVICE Description: STG Adhere to Safety Precautions With Min Assistance/Device. Outcome: Progressing   Problem: RH PAIN MANAGEMENT Goal: RH STG PAIN MANAGED AT OR BELOW PT'S PAIN GOAL Description: Pain goal of 4 out of 0-10 pain scale Outcome: Progressing

## 2020-03-04 NOTE — Progress Notes (Signed)
[place] PHYSICAL MEDICINE & REHABILITATION PROGRESS NOTE   Subjective/Complaints:  Pt reports still not sleeping well- wants something to help.  Also discussed adding amantadine to help with aphasia issues- pt agreeable.    ROS:  Pt denies SOB, abd pain, CP, N/V/C/D, and vision changes   Objective:   No results found. No results for input(s): WBC, HGB, HCT, PLT in the last 72 hours. Recent Labs    03/03/20 0721  NA 139  K 3.8  CL 104  CO2 26  GLUCOSE 106*  BUN 14  CREATININE 1.14  CALCIUM 9.2    Intake/Output Summary (Last 24 hours) at 03/04/2020 1028 Last data filed at 03/04/2020 0900 Gross per 24 hour  Intake 253 ml  Output 900 ml  Net -647 ml     Physical Exam: Vital Signs Blood pressure 104/73, pulse 64, temperature 98 F (36.7 C), temperature source Oral, resp. rate 16, height 6\' 2"  (1.88 m), weight 113 kg, SpO2 98 %. Constitutional: No distress . Vital signs reviewed.standing walking with RW going over obstacles with PT, NAD HENT: Normocephalic.  Atraumatic. Eyes: conjugate gaze Cardiovascular: no JVD. Respiratory: normal effort, no accessory muscle use GI: abd nondistended, soft Skin: Warm and dry.  Intact. Psych: flat affect. Musc: Right shoulder limited range of motion due to RTC injury (baseline) Neurological: Alert. Mild left facial weakness with dysarthria Following 2 step commands Motor: RUE/RLE: 5/5 proximal to distal LUE/LLE: 4+-5/5 proximal to distal with apraxia  Assessment/Plan: 1. Functional deficits secondary to [place] with L hemiparesis  which require 3+ hours per day of interdisciplinary therapy in a comprehensive inpatient rehab setting.  Physiatrist is providing close team supervision and 24 hour management of active medical problems listed below.  Physiatrist and rehab team continue to assess barriers to discharge/monitor patient progress toward functional and medical goals  Care Tool:  Bathing    Body parts bathed by  patient: Right arm, Left arm, Chest, Abdomen, Front perineal area, Right upper leg, Buttocks, Left upper leg, Right lower leg, Left lower leg, Face         Bathing assist Assist Level: Supervision/Verbal cueing     Upper Body Dressing/Undressing Upper body dressing   What is the patient wearing?: Pull over shirt    Upper body assist Assist Level: Set up assist    Lower Body Dressing/Undressing Lower body dressing      What is the patient wearing?: Underwear/pull up, Pants     Lower body assist Assist for lower body dressing: Supervision/Verbal cueing     Toileting Toileting    Toileting assist Assist for toileting: Contact Guard/Touching assist Assistive Device Comment: urinal   Transfers Chair/bed transfer  Transfers assist     Chair/bed transfer assist level: Contact Guard/Touching assist     Locomotion Ambulation   Ambulation assist      Assist level: Minimal Assistance - Patient > 75% Assistive device: Walker-rolling Max distance: 231'   Walk 10 feet activity   Assist     Assist level: Minimal Assistance - Patient > 75% Assistive device: Hand held assist   Walk 50 feet activity   Assist Walk 50 feet with 2 turns activity did not occur: Safety/medical concerns(L hemiparesis, decreased balance, fatigue)  Assist level: Minimal Assistance - Patient > 75% Assistive device: Walker-rolling    Walk 150 feet activity   Assist Walk 150 feet activity did not occur: Safety/medical concerns(L hemiparesis, decreased balance, fatigue)  Assist level: Minimal Assistance - Patient > 75% Assistive device: Walker-rolling  Walk 10 feet on uneven surface  activity   Assist Walk 10 feet on uneven surfaces activity did not occur: Safety/medical concerns(L hemiparesis, L foot drag, decreased balance, fatigue)         Wheelchair     Assist Will patient use wheelchair at discharge?: No Type of Wheelchair: Manual    Wheelchair assist level:  Supervision/Verbal cueing Max wheelchair distance: 60ft    Wheelchair 50 feet with 2 turns activity    Assist        Assist Level: Moderate Assistance - Patient 50 - 74%   Wheelchair 150 feet activity     Assist      Assist Level: Maximal Assistance - Patient 25 - 49%   Blood pressure 104/73, pulse 64, temperature 98 F (36.7 C), temperature source Oral, resp. rate 16, height 6\' 2"  (1.88 m), weight 113 kg, SpO2 98 %.  Medical Problem List and Plan: 1.  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   Continue [name]  4/20- will try adding Amantadine 100 mg daily for now and make sure can tolerate, then increase.  2.  Antithrombotics: -DVT/anticoagulation:  Mechanical: Sequential compression devices, below knee Bilateral lower extremities             -antiplatelet therapy: N/a due to bleed  3. Chronic back pain/Pain Management: uses Oxymorphone 10 mg bid per Dr. Brien Few pain management--> MS contin 15 mg bid as substitute.  4/20- pain controlled- con't meds  Monitor with increased exertion 4. Mood: LCSW to follow for evaluation and support.              -antipsychotic agents: N/A 5. Neuropsych: This patient is capable of making decisions on his own behalf.  Neuropsych consulted 6. Skin/Wound Care: Routine pressure relief measures.  7. Fluids/Electrolytes/Nutrition: Monitor I/O.   8. HTN: Monitor qid--continue Avapro.   Norvasc DC'd  4/20- BP soft still at 104/73- pulse 64- will con't to monitor             Monitor with increased mobility. 9.  Left thyroid nodule: Thyroid ultrasound on outpatient basis?   4/15- will let PCP know and arrange outpatient 10. Low grade fevers: Resolved encourage IS. Continue to monitor vital signs/labs. 11. Barrett's esophagus: On PPI but still has intermittent issues with GERD.  Changed to Nexium/Esomeprazole 40 mg daily since pt still having GERD Sx's/GI pain. 12. Bradycardia  4/20- borderline/stable- is  chronic 13. Urinary retention  Continue Flomax 0.4 mg nightly to get pt to void  I/O caths as necessary 14.  Sleep disturbance  Appears to be related to anxiety/depression  Continue trazodone  4/20- increased trazodone to 150 mg QHS-   LOS: 6 days A FACE TO FACE EVALUATION WAS PERFORMED  Richard Vaughn 03/04/2020, 10:28 AM

## 2020-03-04 NOTE — Progress Notes (Signed)
Occupational Therapy Session Note  Patient Details  Name: GRAINGER LOMBOY MRN: [id number] Date of Birth: [birth year]  Today's Date: 03/04/2020 OT Individual Time: 1015-1130 OT Individual Time Calculation (min): 75 min    Short Term Goals: Week 1:  OT Short Term Goal 1 (Week 1): NA due to LOS - STG = LTG  Skilled Therapeutic Interventions/Progress Updates:    Patient in bed, wife present for therapy session.  Patient reports not sleeping well last night, denies pain.  Bed mobility independent.  Sit to stand and short distance ambulation with RW CS.  He stood at sink to complete bathing tasks with CS/set up.  UB dressing in stance with CS.  Grooming tasks in stance CS.  LB dressing seated in w/c with CS/set up.  Reviewed DME, safety with adl, positioning, energy conservation techniques, coordination, initiation and sequencing - patient and wife demonstrate good understanding.  Wife ordered tub bench for home use, no other DME needs at this time.  Completed ambulation on unit with RW CS - min cues/facilitation for weight shift.  He returned to recliner at close of session.  Ice pack provided for left shoulder due to discomfort after activity.    Therapy Documentation Precautions:  Precautions Precautions: Fall Restrictions Weight Bearing Restrictions: No General:   Vital Signs: Therapy Vitals Temp: 98 F (36.7 C) Temp Source: Oral Pulse Rate: 64 Resp: 16 BP: 104/73 Patient Position (if appropriate): Sitting Oxygen Therapy SpO2: 98 % O2 Device: Room Air  Therapy/Group: Individual Therapy  Carlos Levering 03/04/2020, 7:38 AM

## 2020-03-04 NOTE — Progress Notes (Signed)
Social Work Patient ID: Richard Vaughn, male   DOB: [birth year], 69 y.o.   MRN: [id number]   SW met with pt and pt wife in room to provide updates from team conference, and d/c date 4/24. SW informed will follow-up if there is any recommended DME aside from the shower chair purchased. Prefers [facility] for outpatient therapies as they have used in the past.   Loralee Pacas, MSW, [place] Office: [phone] Cell: [phone] Fax: [phone]

## 2020-03-04 NOTE — Patient Care Conference (Signed)
Inpatient RehabilitationTeam Conference and Plan of Care Update Date: 03/04/2020   Time: 11:30 AM    Patient Name: Richard Vaughn      Medical Record Number: [id number]  Date of Birth: 10-18-1951 Sex: Male         Room/Bed: 4W07C/4W07C-01 Payor Info: Payor: MEDICARE / Plan: MEDICARE PART A AND B / Product Type: *No Product type* /    Admit Date/Time:  02/27/2020  2:32 PM  Primary Diagnosis:  ICH (intracerebral hemorrhage) [facility])  Patient Active Problem List   Diagnosis Date Noted  . Depressive reaction   . Sleep disturbance   . Urinary retention   . Bradycardia   . Essential hypertension   . Temperature elevated   . Thyroid nodule   . Treatment-emergent central sleep apnea 10/25/2018  . 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 Chronic pain syndrome   . Chronic bilateral low back pain   . Benign essential HTN   . Tobacco abuse   . Marijuana abuse   . Hyperlipidemia   . History of CVA with residual deficit   . Dysphagia, post-stroke   . ICH (intracerebral hemorrhage) (HCC) - R thalmic/PLIC d/t HTN 123456  . Insomnia 07/13/2017  . PAD (peripheral artery disease) ([place]) 07/13/2017  . Hyperlipidemia with target LDL less than 70 07/13/2017  . Stroke-like symptom 09/22/2016  . Paresthesia 09/22/2016  . CVA (cerebral vascular accident) ([place]) 09/22/2016  . Cerebrovascular accident (CVA) due to thrombosis of left carotid artery ([place]) 09/23/2015  . Primary snoring 09/23/2015  . Hypersomnia with sleep apnea 09/23/2015  . Obstructive sleep apnea 09/23/2015  . Lacunar infarct, acute ([place]) 08/25/2015  . Sleep apnea 08/25/2015  . Dysarthria   . CVD (cardiovascular disease) 08/02/2015  . Acute hyperglycemia 08/02/2015  . Systolic hypertension with cerebrovascular disease 12/27/2014  . Epistaxis 07/28/2012  . HYPERGLYCEMIA 10/02/2010  . NEPHROLITHIASIS 05/23/2009  . BARRETTS ESOPHAGUS 02/20/2009  . Gastroparesis 02/20/2009  . RADICULOPATHY  10/21/2008  . GERD 10/08/2008  . ESOPHAGITIS 08/15/2008  . ESOPHAGEAL STRICTURE 08/15/2008  . GASTRITIS, ACUTE 08/15/2008  . RENAL CYST 08/14/2008  . TOBACCO ABUSE 08/08/2008  . HEMATURIA, MICROSCOPIC, HX OF 08/08/2008  . PULMONARY NODULE 01/10/2008    Expected Discharge Date: Expected Discharge Date: 03/07/20  Team Members Present: Physician leading conference: Dr. Courtney Heys Care Coodinator Present: Karene Fry, RN, MSN;Auria Barbra Sarks, [place] Nurse Present: Vernie Murders, RN PT Present: Apolinar Junes, PT OT Present: Elisabeth Most, OT SLP Present: Weston Anna, SLP PPS Coordinator present : Ileana Ladd, Burna Mortimer, SLP     Current Status/Progress Goal Weekly Team Focus  Bowel/Bladder   continent of B/B  remain continent  toilet prn   Swallow/Nutrition/ Hydration             ADL's   CS/set up for UB/LB bathing and dressing.  CS/CGA functional transfers, tearful 4/19 am  set up/CS  left NMRE, balance, adl training, family education   Mobility   bed mobility supervision, transfers min A-CGA with and without RW, ambulation 231 ft w/RW and min assist-CGA, 4 steps 2 rails mod A  Mod I bed mobility and transfers, supervision ambulation/stairs  Functional mobility/transfers, ambulation, stair navigation, dynamic standing balance/coordination, endurance   Communication   Min-Mod A  Supervision  speech intelligibility strategies, increase vocal intensity   Safety/Cognition/ Behavioral Observations            Pain   c/o shoulder pain  pain 3/10  assess pain qshift and prn medicate as  directed   Skin   skin intact  remain intact  assess skin qshift and prn    Rehab Goals Patient on target to meet rehab goals: Yes *See Care Plan and progress notes for long and short-term goals.     Barriers to Discharge  Current Status/Progress Possible Resolutions Date Resolved   Nursing                  PT  Home environment access/layout  3 STE home with 2 rails               OT                  SLP                SW                Discharge Planning/Teaching Needs:  D/c to home with 24/7 care from his wife  Family education as recommended by therapy   Team Discussion: MD sleep issues, meds increased.  RN L hemi, slurred speech, cont B/B, BM 4/18, can DC bladder scans, pain, given MS contin.  OT self care S, wife here today, drags L leg, slow to respond.  PT I bed, CGA/close S walker 200', CGA/min A no device 100', stairs 12 with CGA, wife for fam ed Thursday.  SLP is dysarthric, working on vocal intensity.   Revisions to Treatment Plan: N/A     Medical Summary Current Status: L hemi; got amantadine; continent B/B- LBM 4/18- SCDs for DVT prophylaxis; chronic back pain; Weekly Focus/Goal: OT_ supervision level- drags LLE some- distracted at times- overanalyzes- slow to initiate; working on L shoulder activities  Barriers to Discharge: Medical stability;Home enviroment access/layout;Decreased family/caregiver support;Weight;Weight bearing restrictions  Barriers to Discharge Comments: SLP- no issues- Possible Resolutions to Barriers: no equipment- PT- mod I bed mob; CGA close [place] with RW >296ft ; 100 ft with no AD- CGA- stairs 12 - CGA; dynamic gait   Continued Need for Acute Rehabilitation Level of Care: The patient requires daily medical management by a physician with specialized training in physical medicine and rehabilitation for the following reasons: Direction of a multidisciplinary physical rehabilitation program to maximize functional independence : Yes Medical management of patient stability for increased activity during participation in an intensive rehabilitation regime.: Yes Analysis of laboratory values and/or radiology reports with any subsequent need for medication adjustment and/or medical intervention. : Yes   I attest that I was present, lead the team conference, and concur with the assessment and plan of the team.   Retta Diones 03/05/2020, 9:37 AM   Team conference was held via web/ teleconference due to [place] - 19

## 2020-03-04 NOTE — Progress Notes (Signed)
Physical Therapy Session Note  Patient Details  Name: Richard Vaughn MRN: [id number] Date of Birth: [birth year]  Today's Date: 03/04/2020 PT Individual Time: 0800-0915 PT Individual Time Calculation (min): 75 min   Short Term Goals: Week 1:  PT Short Term Goal 1 (Week 1): STG=LTG due to LOS  Skilled Therapeutic Interventions/Progress Updates:     Patient in recliner in the room upon PT arrival. Patient alert and agreeable to PT session. Patient denied pain during session. He reported that he was fatigued due to not sleeping well last night.  Therapeutic Activity: Bed Mobility: Patient performed supine to/from sit in the ADL bed and sit to supine in a flat hospital bed without rails independently.  Transfers: Patient performed sit to/from stand throughout session, including ADL bed, ADL recliner, standard arm chair, and standard chair without arms with close supervision with and without a RW. Provided verbal cues for forward weight shift and scooting forward in the chair to stand.  Gait Training:  Patient ambulated 231 feet, 55 feet, and 272 feet using RW with close supervision. He also ambulated ~100 feet without an AD with CGA and min A x1 for steadying due to decreased L foot clearance. Ambulated with decreased L step height and intermittent toe drag and mild forward trunk lean. Provided verbal cues for erect posture, external cues for increased L step height, and attending to his L side to avoid objects. Patient ascending/descended 12 steps using B rails with CGA. Performed step-to gait pattern leading with R while ascending and L while descending with cues for sequencing x3. Provided cues for technique and sequencing. He reported mild dizziness prior to descending the last 4 steps that resolved quickly once seated.   Neuromuscular Re-ed: Patient performed the following dynamic gait activities with CGA and intermittent min A for steadying with minor LOB: -obstacle course 1: ambulated  around cones, over 2 hockey sticks, and up/down 6" steps with RW, hit 1 cone when passing on the L. Provided cues for use of RW throughout. -weaving through 5 cones 2 feet apart Trial 1: practice, hit 1 cone Trial 2: time: 28.9 sec, hit 1 cone on L Trial 3: time: 23.1 sec, hit 1 cone on L Trial 4: time: 25 sec, hit 1 cone on L (increased toe drag due to fatigue) Patient was not provided rest breaks between trials for endurance challenge -obstacle course 2: weaving through 5 cones 1 foot apart, ambulated over blue floor mat forwards then backwards, 180 degree turn, then stepping over cones x2  Patient in bed with his wife in the room at end of session with breaks locked, bed alarm set, and all needs within reach. Discussed family education scheduling with patient's wife, set for Thursday morning.    Therapy Documentation Precautions:  Precautions Precautions: Fall Restrictions Weight Bearing Restrictions: No    Therapy/Group: Individual Therapy  Victoriana Aziz L Nayara Taplin PT, DPT  03/04/2020, 4:09 PM

## 2020-03-04 NOTE — Progress Notes (Signed)
Speech Language Pathology Daily Session Note  Patient Details  Name: Richard Vaughn MRN: [id number] Date of Birth: Sep 28, 1951  Today's Date: 03/04/2020 SLP Individual Time: 1220-1330 SLP Individual Time Calculation (min): 70 min  Short Term Goals: Week 1: SLP Short Term Goal 1 (Week 1): Pt will implement increased vocal intensity and overarticulation strategies during sentence and conversation level tasks with Min A cues. SLP Short Term Goal 2 (Week 1): Pt will achieve 85% intelligibility in conversation with Min A multimodal cues for speech intelligibility straegies.  Skilled Therapeutic Interventions: Pt was seen for skilled ST intervention targeting aforementioned goals. Pt was pleasant and cooperative during this session. Wife was in attendance. SLP facilitated session by discussing goals for speech therapy to educate wife. Pt required mod cues to recall intelligibility strategies. SLP oral motor strengthening exercises including lingual lateralization with resistance and drinking thick liquid (milkshake, pudding, yogurt, etc) through a straw. This information was written in the notebook to facilitate recall. SLP reviewed PT/OT session activities with pt, and assisted with writing this information in his notebook - pt had indicated he does not feel that he is making progress, so the notebook was implemented to assist with recall of therapy details, and to document status so pt could refer to it for information on his improvement. Pt/wife were encouraged to continue writing information in the notebook after DC.  Given concerns for decreased recall, The SLUMS examination was administered. Pt scored 21/30, indicating mild neurocognitive impairment. Points were lost on mental math, delayed recall, and thought organization.   Pt was left in recliner, wife present, all needs within reach. Continue ST per current plan of care.    Pain Pain Assessment Pain Scale: 0-10 Pain Score: 4  Pain Type:  Chronic pain Pain Location: Shoulder Pain Orientation: Left Pain Descriptors / Indicators: Other (Comment)(stiff)  Therapy/Group: Individual Therapy  Shandrea Lusk B. Quentin Ore, [facility], CCC-SLP Speech Language Pathologist  Shonna Chock 03/04/2020, 1:46 PM

## 2020-03-05 ENCOUNTER — Inpatient Hospital Stay (HOSPITAL_COMMUNITY): Payer: Federal, State, Local not specified - PPO | Admitting: Physical Therapy

## 2020-03-05 ENCOUNTER — Inpatient Hospital Stay (HOSPITAL_COMMUNITY): Payer: Federal, State, Local not specified - PPO | Admitting: Occupational Therapy

## 2020-03-05 ENCOUNTER — Inpatient Hospital Stay (HOSPITAL_COMMUNITY): Payer: Federal, State, Local not specified - PPO

## 2020-03-05 NOTE — Progress Notes (Signed)
Physical Therapy Session Note  Patient Details  Name: Richard Vaughn MRN: [id number] Date of Birth: 20-Dec-1950  Today's Date: 03/05/2020 PT Individual Time: 1105-1200 AND 1300-1325 PT Individual Time Calculation (min): 55 min AND 25 min  Short Term Goals: Week 1:  PT Short Term Goal 1 (Week 1): STG=LTG due to LOS  Skilled Therapeutic Interventions/Progress Updates:   Session 1:  Pt in recliner and agreeable to therapy, no c/o pain. Sit>stand to RW w/ supervision. Ambulated to/from therapy gym w/ close supervision using RW. Pt slow gait speed and needed cues for RW management, verbal and tactile cues for increased L foot clearance. Pt able to self-correct w/ auditory cue of toe dragging ~50% of the time. Worked on L foot clearance and L SLS while performing 3", 2", and flat surface toe taps to visual target. Emphasis on fluid, alternating step taps. Verbal and visual cues for increased L foot clearance, manual assist for lateral weight shifting, and min-mod assist for upright balance in SLS bilaterally. Performed in 3-4 bouts of standing. Worked on gait training in hallway w/o AD, ambulated 200-250' w/ min assist. Manual cues for lateral weight shifting at pelvis and min verbal and tactile cues for increased L foot clearance. Pt's gait pattern often decreases in quality w/ distractions. Pt also w/ anterior trunk lean bias and needed to stop and pause multiple times to prevent LOB anteriorly. Ambulated in 3-4 bouts. Performed bimanual pipe tree tasks during seated rest breaks, emphasis on incorporating LUE. Ambulated back to room and pt attempted to void in stance at toilet, unable despite increased time. Ended session in recliner, all needs in reach.   Session 2:  Pt received in recliner and agreeable to therapy, no c/o pain. Ambulated to/from dayroom w/o AD, min assist overall but poor carryover of L foot clearance that was emphasized in AM session. Slow gait pattern and pt needed to stop to  prevent anterior LOB multiple times. Pt visibly frustrated by this, encouraged him to remain motivated during therapies despite times when his progress appears slow. Performed NuStep 5 min x2 @ level 4 w/ all 4 extremities to work on endurance and global strengthening. Ambulated back to room w/ min assise, improved L foot clearance however continued to need frequent verbal and tactile cues. Ended session in recliner, all needs in reach.   Therapy Documentation Precautions:  Precautions Precautions: Fall Restrictions Weight Bearing Restrictions: No  Therapy/Group: Individual Therapy  Sirus Labrie Clent Demark 03/05/2020, 12:16 PM

## 2020-03-05 NOTE — Progress Notes (Signed)
Occupational Therapy Session Note  Patient Details  Name: Richard Vaughn MRN: [id number] Date of Birth: 05-12-1951  Today's Date: 03/05/2020 OT Individual Time: 0813-8871 OT Individual Time Calculation (min): 54 min    Short Term Goals: Week 1:  OT Short Term Goal 1 (Week 1): NA due to LOS - STG = LTG  Skilled Therapeutic Interventions/Progress Updates:  Patient met lying supine in bed in agreement with OT treatment session. Focus on self-care re-education, dynamic sitting balance, LUE GMC/FMC and functional transfers as detailed below.  Supine to EOB with supervision A. Patient able to don shoes and tie shoe laces with supervision A and increased time secondary to decreased LUE [place]. Patient notes frustration with difficulty performing simple tasks. OT provided emotional support. Sit to stand from bed in lowest setting with CGA-supervision A and increased time. Patient able to gather ADL items in room with close supervision A. Walk-in shower transfer with CGA for walker management in tight space. Patient completed UB/LB bathing/dressing with increased time for use of LUE, supervision A and occasional cues for pacing. Oral hygiene performed standing at sink level with close supervision A. Stand-pivot transfer to recliner with supervision A and cues for proximity to chair and hand placement. Session concluded with patient seated in recliner with call bell within reach, belt alarm activated, and all needs met.   Therapy Documentation Precautions:  Precautions Precautions: Fall Restrictions Weight Bearing Restrictions: No  Therapy/Group: Individual Therapy  [name] 03/05/2020, 7:45 AM

## 2020-03-05 NOTE — Progress Notes (Signed)
[place] PHYSICAL MEDICINE & REHABILITATION PROGRESS NOTE   Subjective/Complaints:  Pt reports feeling good this AM- slept much better- denies any issues.   ROS:  Pt denies SOB, abd pain, CP, N/V/C/D, and vision changes   Objective:   No results found. No results for input(s): WBC, HGB, HCT, PLT in the last 72 hours. Recent Labs    03/03/20 0721  NA 139  K 3.8  CL 104  CO2 26  GLUCOSE 106*  BUN 14  CREATININE 1.14  CALCIUM 9.2    Intake/Output Summary (Last 24 hours) at 03/05/2020 1759 Last data filed at 03/05/2020 1636 Gross per 24 hour  Intake 626 ml  Output 1700 ml  Net -1074 ml     Physical Exam: Vital Signs Blood pressure 91/64, pulse 71, temperature 97.7 F (36.5 C), temperature source Oral, resp. rate 16, height 6\' 2"  (1.88 m), weight 113 kg, SpO2 97 %. Constitutional: No distress . Vital signs reviewed.sitting up at EOB, appropriate, NAD HENT: Normocephalic.  Atraumatic. Eyes: conjugate gaze Cardiovascular: RRR_ no JVD Respiratory: CTA B/L- no W/R/R- good air movement GI: Soft, NT, ND, (+)BS  Skin: Warm and dry.  Intact. Psych: flat affect. Musc: Right shoulder limited range of motion due to RTC injury (baseline) Neurological: Alert. Very little spontaneous speech this AM- is still dysarthric Mild left facial weakness with dysarthria Following 2 step commands Motor: RUE/RLE: 5/5 proximal to distal LUE/LLE: 4+-5/5 proximal to distal with apraxia  Assessment/Plan: 1. Functional deficits secondary to [place] with L hemiparesis  which require 3+ hours per day of interdisciplinary therapy in a comprehensive inpatient rehab setting.  Physiatrist is providing close team supervision and 24 hour management of active medical problems listed below.  Physiatrist and rehab team continue to assess barriers to discharge/monitor patient progress toward functional and medical goals  Care Tool:  Bathing    Body parts bathed by patient: Right arm, Left arm,  Chest, Abdomen, Front perineal area, Right upper leg, Buttocks, Left upper leg, Right lower leg, Left lower leg, Face         Bathing assist Assist Level: Supervision/Verbal cueing     Upper Body Dressing/Undressing Upper body dressing   What is the patient wearing?: Pull over shirt    Upper body assist Assist Level: Set up assist    Lower Body Dressing/Undressing Lower body dressing      What is the patient wearing?: Underwear/pull up, Pants     Lower body assist Assist for lower body dressing: Supervision/Verbal cueing     Toileting Toileting    Toileting assist Assist for toileting: Contact Guard/Touching assist Assistive Device Comment: urinal   Transfers Chair/bed transfer  Transfers assist     Chair/bed transfer assist level: Independent     Locomotion Ambulation   Ambulation assist      Assist level: Contact Guard/Touching assist Assistive device: No Device Max distance: 533'   Walk 10 feet activity   Assist     Assist level: Contact Guard/Touching assist Assistive device: No Device   Walk 50 feet activity   Assist Walk 50 feet with 2 turns activity did not occur: Safety/medical concerns(L hemiparesis, decreased balance, fatigue)  Assist level: Contact Guard/Touching assist Assistive device: No Device    Walk 150 feet activity   Assist Walk 150 feet activity did not occur: Safety/medical concerns(L hemiparesis, decreased balance, fatigue)  Assist level: Contact Guard/Touching assist Assistive device: No Device    Walk 10 feet on uneven surface  activity   Assist Walk  10 feet on uneven surfaces activity did not occur: Safety/medical concerns(L hemiparesis, L foot drag, decreased balance, fatigue)         Wheelchair     Assist Will patient use wheelchair at discharge?: No Type of Wheelchair: Manual    Wheelchair assist level: Supervision/Verbal cueing Max wheelchair distance: 14ft    Wheelchair 50 feet with 2  turns activity    Assist        Assist Level: Moderate Assistance - Patient 50 - 74%   Wheelchair 150 feet activity     Assist      Assist Level: Maximal Assistance - Patient 25 - 49%   Blood pressure 91/64, pulse 71, temperature 97.7 F (36.5 C), temperature source Oral, resp. rate 16, height 6\' 2"  (1.88 m), weight 113 kg, SpO2 97 %.  Medical Problem List and Plan: 1.  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   Continue [name]  4/20- will try adding Amantadine 100 mg daily for now and make sure can tolerate, then increase.   4/21- tolerated well- no side effects- will increase Friday to 200 mg in AM 2.  Antithrombotics: -DVT/anticoagulation:  Mechanical: Sequential compression devices, below knee Bilateral lower extremities             -antiplatelet therapy: N/a due to bleed  3. Chronic back pain/Pain Management: uses Oxymorphone 10 mg bid per Dr. Brien Few pain management--> MS contin 15 mg bid as substitute.  4/21- pain controlled- con't meds  Monitor with increased exertion 4. Mood: LCSW to follow for evaluation and support.              -antipsychotic agents: N/A 5. Neuropsych: This patient is capable of making decisions on his own behalf.  Neuropsych consulted 6. Skin/Wound Care: Routine pressure relief measures.  7. Fluids/Electrolytes/Nutrition: Monitor I/O.   8. HTN: Monitor qid--continue Avapro.   Norvasc DC'd  4/20- BP soft still at 104/73- pulse 64- will con't to monitor  4/21- BP 91/65- very soft/low- stop Avapro since on Flomax AND amantadine- both can lower BP- can restart after can get him off those meds.              Monitor with increased mobility. 9.  Left thyroid nodule: Thyroid ultrasound on outpatient basis?   4/15- will let PCP know and arrange outpatient 10. Low grade fevers: Resolved encourage IS. Continue to monitor vital signs/labs. 11. Barrett's esophagus: On PPI but still has intermittent issues with  GERD.  Changed to Nexium/Esomeprazole 40 mg daily since pt still having GERD Sx's/GI pain. 12. Bradycardia  4/20- borderline/stable- is chronic 13. Urinary retention  Continue Flomax 0.4 mg nightly to get pt to void  4/21- pt isn't retaining anymore, per staff  I/O caths as necessary 14.  Sleep disturbance  Appears to be related to anxiety/depression  Continue trazodone  4/20- increased trazodone to 150 mg QHS-   4/21- slept much better with increase in trazodone.   LOS: 7 days A FACE TO FACE EVALUATION WAS PERFORMED  Kaydi Kley 03/05/2020, 5:59 PM

## 2020-03-05 NOTE — Progress Notes (Signed)
Physical Therapy Session Note  Patient Details  Name: FONG MARCOS MRN: [id number] Date of Birth: 10-17-1951  Today's Date: 03/05/2020 PT Individual Time: 1450-1535 PT Individual Time Calculation (min): 45 min   Short Term Goals: Week 1:  PT Short Term Goal 1 (Week 1): STG=LTG due to LOS  Skilled Therapeutic Interventions/Progress Updates:     Patient in recliner in the room upon PT arrival. Patient alert and agreeable to PT session. Patient reported moderate discomfort in R shoulder with shoulder flexion during session. PT provided repositioning, rest breaks, and distraction as pain interventions throughout session.   Therapeutic Activity: Transfers: Patient performed sit to/from stand from the room recliner x5, ADL recliner x1, and hospital bed x2 with mod I without an AD.   Five times Sit to Stand Test (FTSS) Method: Use a straight back chair with a solid seat that is 16-18" high. Ask participant to sit on the chair with arms folded across their chest.   Instructions: "Stand up and sit down as quickly as possible 5 times, keeping your arms folded across your chest."   Measurement: Stop timing when the participant stands the 5th time.  TIME: ___30___ (in seconds) Required use of B UE's on arm rest of chair to complete test  Times > 13.6 seconds is associated with increased disability and morbidity (Guralnik, 2000) Times > 15 seconds is predictive of recurrent falls in healthy individuals aged 67 and older (Buatois, et al., 2008) Normal performance values in community dwelling individuals aged 67 and older (Bohannon, 2006): o 67-69 years: 11.4 seconds o 70-79 years: 12.6 seconds o 80-89 years: 14.8 seconds  MCID: ? 2.3 seconds for Vestibular Disorders (Meretta, 2006)  Gait Training:  Patient ambulated 254 feet without an AD with CGA and min A x3 for minor LOB due to decreased L foot clearance. Ambulated with step-through gait patter, intermittent decreased L foot clearance,  decreased L weight shift, decreased arm swing, mild forward trunk lean, and decreased L DF during swing with fatigue. Provided verbal cues for erect posture, increased weight shift to stance leg, increased L hip flexion, and introduced adding natural arm swing for improved balance.  6 Min Walk Test:  Instructed patient to ambulate as quickling and as safely as possible for 6 minutes using LRAD. Patient was allowed to take standing rest breaks without stopping the test, but if he required a sitting rest break the clock would be stopped and the test would be over.  Results: 533 feet without an AD with CGA with min A intermittently for minor LOB due to decreased L foot clearance.  Neuromuscular Re-ed: Patient performed the Berg Balance (see details below): Patient demonstrates increased fall risk as noted by score of 32/56 on Berg Balance Scale.  (<36= high risk for falls, close to 100%; 37-45 significant >80%; 46-51 moderate >50%; 52-55 lower >25%)  Patient in recliner at end of session with breaks locked, chair alarm set, and all needs within reach. Educated patient on recommendations for using RW for all mobility outside of therapy and to have close supervision when ambulating. Reviewed result of outcome measured performed and patient progress with the patient. He was very pleased and eager to continue progressing with OPPT upon d/c.    Therapy Documentation Precautions:  Precautions Precautions: Fall Restrictions Weight Bearing Restrictions: No Balance: Standardized Balance Assessment Standardized Balance Assessment: Berg Balance Test Berg Balance Test Sit to Stand: Able to stand  independently using hands Standing Unsupported: Able to stand safely 2 minutes Sitting with  Back Unsupported but Feet Supported on Floor or Stool: Able to sit safely and securely 2 minutes Stand to Sit: Controls descent by using hands Transfers: Able to transfer with verbal cueing and /or supervision Standing  Unsupported with Eyes Closed: Able to stand 10 seconds with supervision Standing Ubsupported with Feet Together: Able to place feet together independently but unable to hold for 30 seconds From Standing, Reach Forward with Outstretched Arm: Can reach forward >12 cm safely (5") From Standing Position, Pick up Object from Floor: Able to pick up shoe, needs supervision From Standing Position, Turn to Look Behind Over each Shoulder: Needs supervision when turning Turn 360 Degrees: Needs assistance while turning Standing Unsupported, Alternately Place Feet on Step/Stool: Needs assistance to keep from falling or unable to try Standing Unsupported, One Foot in Front: Able to plae foot ahead of the other independently and hold 30 seconds Standing on One Leg: Tries to lift leg/unable to hold 3 seconds but remains standing independently Total Score: 32/56    Therapy/Group: Individual Therapy  Daeja Helderman L Gunther Zawadzki PT, DPT  03/05/2020, 4:11 PM

## 2020-03-05 NOTE — Plan of Care (Signed)
  Problem: Consults Goal: RH GENERAL PATIENT EDUCATION Description: See Patient Education module for education specifics. Outcome: Progressing   Problem: RH SKIN INTEGRITY Goal: RH STG SKIN FREE OF INFECTION/BREAKDOWN Description: Skin to remain free of breakdown during rehab stay Outcome: Progressing Goal: RH STG MAINTAIN SKIN INTEGRITY WITH ASSISTANCE Description: STG Maintain Skin Integrity With  min Assistance. Outcome: Progressing   Problem: RH SAFETY Goal: RH STG ADHERE TO SAFETY PRECAUTIONS W/ASSISTANCE/DEVICE Description: STG Adhere to Safety Precautions With Min Assistance/Device. Outcome: Progressing   Problem: RH PAIN MANAGEMENT Goal: RH STG PAIN MANAGED AT OR BELOW PT'S PAIN GOAL Description: Pain goal of 4 out of 0-10 pain scale Outcome: Progressing

## 2020-03-06 ENCOUNTER — Inpatient Hospital Stay (HOSPITAL_COMMUNITY): Payer: Federal, State, Local not specified - PPO | Admitting: Occupational Therapy

## 2020-03-06 ENCOUNTER — Encounter (HOSPITAL_COMMUNITY): Payer: Federal, State, Local not specified - PPO | Admitting: Speech Pathology

## 2020-03-06 ENCOUNTER — Ambulatory Visit (HOSPITAL_COMMUNITY): Payer: Federal, State, Local not specified - PPO

## 2020-03-06 MED ORDER — AMANTADINE HCL 100 MG PO CAPS
200.0000 mg | ORAL_CAPSULE | Freq: Every day | ORAL | Status: DC
  Administered 2020-03-07: 08:00:00 200 mg via ORAL
  Filled 2020-03-06: qty 2

## 2020-03-06 NOTE — Progress Notes (Signed)
Occupational Therapy Discharge Summary  Patient Details  Name: Richard Vaughn MRN: [id number] Date of Birth: 20-Jan-1951  Today's Date: 03/06/2020 OT Individual Time: 1258-1410 OT Individual Time Calculation (min): 72 min   Patient participated in therapy session today to complete reassessment for discharge as documented below.  He was able to ambulate 150 feet x 5 during session with CS - occ cues to attend to left side for foot clearance.  Reviewed and practiced use of RW in kitchen environment - he continues to require cues for hand placement at times.  Poor sleep last night and fatigue limits his stamina for therapy session today.  Patients wife present at start of session.  She states that she is eager to have him home and all of her questions have been answered.  Reviewed strategies to manage the dog if she is excited to see patient upon return home and / or tends to get in the way of the walker.  Patient demonstrates good understanding and states that he is ready for discharge.  He returned to recliner at close of session. Call bell and tray table in reach.   Patient has met 12 of 12 long term goals due to improved activity tolerance, improved balance, postural control and improved coordination.  Patient to discharge at overall Supervision level.  Patient's care partner is independent to provide the necessary physical assistance at discharge.  Recommend supervision for ambulation and dynamic activities in stance due to coordination impairment on left side that persists and generalized fatigue.    Reasons goals not met: na  Recommendation:  Patient will benefit from ongoing skilled OT services in outpatient setting to continue to advance functional skills in the area of BADL and iADL.  Equipment: No equipment provided  Wife ordered shower bench  Reasons for discharge: treatment goals met  Patient/family agrees with progress made and goals achieved: Yes  OT  Discharge Precautions/Restrictions  Precautions Precautions: Fall Precaution Comments: L hemiplegia Restrictions Weight Bearing Restrictions: No  Pain Pain Assessment Pain Scale: 0-10 Pain Score: 3  Pain Type: Chronic pain Pain Location: Shoulder Pain Orientation: Left Pain Descriptors / Indicators: Discomfort Pain Intervention(s): Repositioned ADL ADL Eating: Independent Where Assessed-Eating: Chair Grooming: Independent Where Assessed-Grooming: Chair Upper Body Bathing: Setup Where Assessed-Upper Body Bathing: Shower Lower Body Bathing: Setup Where Assessed-Lower Body Bathing: Shower Upper Body Dressing: Setup Where Assessed-Upper Body Dressing: Chair Lower Body Dressing: Setup Where Assessed-Lower Body Dressing: Chair Toileting: Supervision/safety Where Assessed-Toileting: Glass blower/designer: Close supervision Armed forces technical officer Method: Counselling psychologist: Energy manager: Close supervision Social research officer, government Method: Heritage manager: Radio broadcast assistant Vision Baseline Vision/History: Wears glasses Wears Glasses: At all times Patient Visual Report: No change from baseline Vision Assessment?: No apparent visual deficits Perception  Perception: Within Functional Limits Praxis Praxis: Intact Cognition Overall Cognitive Status: Within Functional Limits for tasks assessed Arousal/Alertness: Awake/alert Orientation Level: Oriented X4 Focused Attention: Appears intact Sustained Attention: Appears intact Memory: Impaired Memory Impairment: Decreased recall of new information;Decreased short term memory Awareness: Appears intact Problem Solving: Appears intact Safety/Judgment: Appears intact Sensation Sensation Light Touch: Appears Intact Proprioception: Appears Intact Coordination Gross Motor Movements are Fluid and Coordinated: No Fine Motor Movements are Fluid and Coordinated: No Coordination  and Movement Description: decreased attention to L side and decreased postural control Finger Nose Finger Test: mild dysmetria on L UE Heel Shin Test: [facility] bilaterally 9 Hole Peg Test: R = 32 sec. L = 52 sec  box and blocks:   R = 41, L = 23 Motor  Motor Motor: Hemiplegia Motor - Discharge Observations: Functional mobility and gait much improved, continues to have deficits in balance/postureal control on L side weakness Mobility  Bed Mobility Rolling Right: Independent Rolling Left: Independent Supine to Sit: Independent Sit to Supine: Independent Transfers Sit to Stand: Independent with assistive device Stand to Sit: Independent with assistive device  Trunk/Postural Assessment     Balance Static Sitting Balance Static Sitting - Level of Assistance: 7: Independent Dynamic Sitting Balance Dynamic Sitting - Level of Assistance: 7: Independent Static Standing Balance Static Standing - Level of Assistance: 7: Independent Dynamic Standing Balance Dynamic Standing - Level of Assistance: 6: Modified independent (Device/Increase time) Extremity/Trunk Assessment RUE Assessment RUE Assessment: Within Functional Limits LUE Assessment Active Range of Motion (AROM) Comments: WFLs t/o General Strength Comments: hx left sh RTC injury, proximal strength limited by pain - at least 3+/5, distal 4+/5   Carlos Levering 03/06/2020, 4:20 PM

## 2020-03-06 NOTE — Progress Notes (Signed)
Speech Language Pathology Discharge Summary  Patient Details  Name: Richard Vaughn MRN: [id number] Date of Birth: 12/30/1950  Today's Date: 03/06/2020 SLP Individual Time: 1005-1030 SLP Individual Time Calculation (min): 25 min   Skilled Therapeutic Interventions:  Skilled treatment session focused on completion of patient and family education with the patient's wife. SLP facilitated session by providing education in regards to speech intelligibility strategies and appropriate cueing for implementation. SLP also provided education and handouts regarding memory compensatory strategies and implementation at home. Both verbalized understanding and agreement of all information. Patient left upright in the recliner with his wife present.    Patient has met 1 of 1 long term goals.  Patient to discharge at overall Supervision level.   Reasons goals not met: N/A   Clinical Impression/Discharge Summary: Patient has made functional gains and has met 1 of 1 LTGs this reporting period. Currently, patient requires overall supervision level verbal cues for utilization of speech intelligibility strategies to maximize intelligibility to 90% at the sentence level. Patient and family education is complete and patient will discharge home with assistance from family. Patient would benefit from f/u SLP services to maximize his speech intelligibility and to address new concerns of higher-level cognitive impairments.   Care Partner:  Caregiver Able to Provide Assistance: Yes  Type of Caregiver Assistance: Physical;Cognitive  Recommendation:  Outpatient SLP  Rationale for SLP Follow Up: Maximize functional communication;Maximize cognitive function and independence;Reduce caregiver burden   Equipment: N/A   Reasons for discharge: Discharged from hospital;Treatment goals met   Patient/Family Agrees with Progress Made and Goals Achieved: Yes    Kamau Weatherall 03/06/2020, 6:25 AM

## 2020-03-06 NOTE — Progress Notes (Signed)
Social Work Patient ID: Richard Vaughn, male   DOB: [birth year], 69 y.o.   MRN: [id number]    SW called [facility] to follow-up about referral. States pt shows up in the system, but still needs formal paperwork. Will continue to work towards scheduling pt. SW to fax in paper referral.   Loralee Pacas, MSW, [place] Office: [phone] Cell: [phone] Fax: [phone]

## 2020-03-06 NOTE — Progress Notes (Signed)
Physical Therapy Discharge Summary  Patient Details  Name: Richard Vaughn MRN: [id number] Date of Birth: 1951/06/30  Today's Date: 03/06/2020 PT Individual Time: 0902-1000 PT Individual Time Calculation (min): 58 min    Patient has met 9 of 10 long term goals due to improved activity tolerance, improved balance, improved postural control, increased strength, ability to compensate for deficits and improved coordination.  Patient to discharge at an ambulatory level Supervision.   Patient's care partner is independent to provide the necessary physical assistance at discharge.  Reasons goals not met: Patient requires supervision for furniture transfers when fatigued due to lower surface height with L LE weakness. Patient's wife can provide this level of assist at home and demonstrated assist technique during family education.  Recommendation:  Patient will benefit from ongoing skilled PT services in outpatient setting to continue to advance safe functional mobility, address ongoing impairments in balance, L UE/LE strength coordination, L side attention, functional mobility, gait training, activity tolerance, patient/caregiver education, and minimize fall risk.  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 Patient alert and agreeable to PT session. Patient denied pain during session. Patient's wife present and participated in family education in preparation for patient d/c throughout session.  Therapeutic Activity: Bed Mobility: Patient performed rolling R/L and supine to/from sit independently in the ADL bed.  Transfers: Patient performed sit to/from stand transfers with mod I from a w/c, mat table, ADL bed, standard chairs with and without arm rests and from the ADL recliner with  close supervision due to increased difficulty from a lower surface height with fatigue today. Patient used a RW with all transfers. Patient performed a simulated truck and sedan height car transfer with min A for trunk height due to stepping up into the vehicle and supervision for the sedan height using a RW. Provided cues for safe technique.  Gait Training:  Patient ambulated around the unit, >250 feet x3 trials, using RW with supervision. Ambulated as described below. Provided verbal cues for increased L step height and looking ahead. Patient ascended/descended 8 steps using B rails with CGA. Performed step-to gait pattern leading with R while ascending and L while descending. Provided cues for technique and sequencing.  Patient ambulated up/down a ramp, over 10 feet of mulch (unlevel surface), and up/down a curb to simulate community ambulation over unlevel surfaces with CGA using a RW. Provided cues for technique and use of AD.  Patient's wife performed all mobility with the patient following PT's demonstration for assist technique. She demonstrated safe guarding techniques and cueing for patient throughout.   Patient in recliner with his wife in the room, handed off to [place], [place] at end of session. Educated patient and his wife on fall risk/prevention, home modifications to prevent falls, and activation of emergency services in the event of a fall during session.    PT Discharge Precautions/Restrictions Precautions Precautions: Fall Precaution Comments: L hemiplegia Restrictions Weight Bearing Restrictions: No Vision/Perception  Perception Perception: Within Functional Limits Praxis Praxis: Intact  Cognition Overall Cognitive Status: Within Functional Limits for tasks assessed Arousal/Alertness: Awake/alert Attention: Focused Focused Attention: Appears intact Sustained Attention: Appears intact Memory: Appears intact Safety/Judgment: Appears intact Sensation Sensation Light  Touch: Appears Intact Proprioception: Appears Intact Coordination Gross Motor Movements are Fluid and Coordinated: No Fine Motor Movements are Fluid and Coordinated:  No Coordination and Movement Description: decreased attention to L side and decreased postural control Heel Shin Test: WFL bilaterally Motor  Motor Motor: Hemiplegia Motor - Skilled Clinical Observations: L hemiparesis, decreased balance/postural control Motor - Discharge Observations: Functional mobility and gait much improved, continues to have deficits in balance/postureal control on L side weakness  Mobility Bed Mobility Rolling Right: Independent Rolling Left: Independent Supine to Sit: Independent Sit to Supine: Independent Transfers Transfers: Sit to Stand;Stand to Sit;Stand Pivot Transfers Sit to Stand: Independent with assistive device Stand to Sit: Independent with assistive device Stand Pivot Transfers: Independent with assistive device Transfer (Assistive device): Rolling walker Locomotion  Gait Ambulation: Yes Gait Assistance: Supervision/Verbal cueing Gait Distance (Feet): 250 Feet Assistive device: Rolling walker Gait Gait: Yes Gait Pattern: Step-through pattern;Decreased step length - left;Decreased stance time - right;Decreased stride length;Decreased hip/knee flexion - left;Decreased dorsiflexion - left;Decreased weight shift to right;Decreased trunk rotation;Trunk flexed;Poor foot clearance - left Gait velocity: decreased High Level Ambulation High Level Ambulation: Side stepping;Backwards walking;Direction changes Side Stepping: slow and deliberate, requires cues for increased L step height for foot clearance Backwards Walking: slow and deliberate, requires cues for increased L step height for foot clearance Direction Changes: reduced L foot clearance during turns Stairs / Additional Locomotion Stairs: Yes Stairs Assistance: Contact Guard/Touching assist Stair Management Technique: Two  rails Number of Stairs: 12 Height of Stairs: 6 Wheelchair Mobility Wheelchair Mobility: No(Patient is a Engineer, petroleum)  Trunk/Postural Assessment  Cervical Assessment Cervical Assessment: Within Functional Limits Thoracic Assessment Thoracic Assessment: Within Functional Limits Lumbar Assessment Lumbar Assessment: Within Functional Limits Postural Control Postural Control: Deficits on evaluation  Balance Static Sitting Balance Static Sitting - Balance Support: Feet supported;No upper extremity supported Static Sitting - Level of Assistance: 7: Independent Dynamic Sitting Balance Dynamic Sitting - Balance Support: Feet supported;No upper extremity supported;During functional activity Dynamic Sitting - Level of Assistance: 7: Independent Static Standing Balance Static Standing - Balance Support: During functional activity;No upper extremity supported Static Standing - Level of Assistance: 7: Independent Dynamic Standing Balance Dynamic Standing - Balance Support: During functional activity;Right upper extremity supported;Left upper extremity supported Dynamic Standing - Level of Assistance: 6: Modified independent (Device/Increase time) Extremity Assessment  RLE Assessment RLE Assessment: Within Functional Limits Active Range of Motion (AROM) Comments: WFL for all functional mobility General Strength Comments: Grossly in sitting: 5/5 throughout LLE Assessment LLE Assessment: Exceptions to [facility] Active Range of Motion (AROM) Comments: WFL for all functional mobility General Strength Comments: Grossly in sitting: 4+/5 throughout, except knee flexion 5/5  Outcome Measures Berg Balance Test Sit to Stand: Able to stand  independently using hands Standing Unsupported: Able to stand safely 2 minutes Sitting with Back Unsupported but Feet Supported on Floor or Stool: Able to sit safely and securely 2 minutes Stand to Sit: Controls descent by using hands Transfers: Able to  transfer with verbal cueing and /or supervision Standing Unsupported with Eyes Closed: Able to stand 10 seconds with supervision Standing Ubsupported with Feet Together: Able to place feet together independently but unable to hold for 30 seconds From Standing, Reach Forward with Outstretched Arm: Can reach forward >12 cm safely (5") From Standing Position, Pick up Object from Floor: Able to pick up shoe, needs supervision From Standing Position, Turn to Look Behind Over each Shoulder: Needs supervision when turning Turn 360 Degrees: Needs assistance while turning Standing Unsupported, Alternately Place Feet on Step/Stool: Needs assistance to keep from falling or unable to try Standing Unsupported, One Foot in Front: Able to  plae foot ahead of the other independently and hold 30 seconds Standing on One Leg: Tries to lift leg/unable to hold 3 seconds but remains standing independently Total Score: 32/56 Five times Sit to Stand Test (FTSS) Method: Use a straight back chair with a solid seat that is 16-18" high. Ask participant to sit on the chair with arms folded across their chest.   Instructions: "Stand up and sit down as quickly as possible 5 times, keeping your arms folded across your chest."   Measurement: Stop timing when the participant stands the 5th time.  TIME: ___30___ (in seconds) Required use of B UE's on arm rest of chair to complete test  Times > 13.6 seconds is associated with increased disability and morbidity (Guralnik, 2000) Times > 15 seconds is predictive of recurrent falls in healthy individuals aged 18 and older (Buatois, et al., 2008) Normal performance values in community dwelling individuals aged 70 and older (Bohannon, 2006): ? 60-69 years: 11.4 seconds ? 70-79 years: 12.6 seconds ? 80-89 years: 14.8 seconds  MCID: ? 2.3 seconds for Vestibular Disorders (Meretta, 2006) 6 Min Walk Test: Instructed patient to ambulate as quickling and as safely as possible for  6 minutes using LRAD. Patient was allowed to take standing rest breaks without stopping the test, but if he required a sitting rest break the clock would be stopped and the test would be over.  Results: 533 feet without an AD with CGA with min A intermittently for minor LOB due to decreased L foot clearance.  Timothee Gali L Shirlette Scarber PT, DPT  03/06/2020, 6:23 AM

## 2020-03-06 NOTE — Progress Notes (Signed)
Occupational Therapy Session Note  Patient Details  Name: Richard Vaughn MRN: [id number] Date of Birth: [birth year]  Today's Date: 03/06/2020 OT Individual Time: 1530-1600 OT Individual Time Calculation (min): 30 min    Short Term Goals: Week 1:  OT Short Term Goal 1 (Week 1): NA due to LOS - STG = LTG  Skilled Therapeutic Interventions/Progress Updates:    Upon entering the room, pt seated in recliner chair with no c/o pain but reports fatigue. Pt is excited to be going home tomorrow and "sleeping in my own bed". Pt reports family education happened this morning and he feels prepared for home. Session with focus on L UE coordination tasks for finger isolation exercises and rapid alternating movements. Pt with increased frustration with tasks as these are more difficulty for him during session. OT providing therapeutic use of self and educating pt as he asks, "Why is this so hard for me". Pt very thankful for staffs help and wishes to remain seated in recliner chair with call bell and all needed items within reach.   Therapy Documentation Precautions:  Precautions Precautions: Fall Precaution Comments: L hemiplegia Restrictions Weight Bearing Restrictions: No ADL: ADL Eating: Set up Where Assessed-Eating: Chair Grooming: Contact guard Where Assessed-Grooming: Standing at sink Upper Body Bathing: Supervision/safety Where Assessed-Upper Body Bathing: Shower Lower Body Bathing: Contact guard Where Assessed-Lower Body Bathing: Shower Upper Body Dressing: Supervision/safety Where Assessed-Upper Body Dressing: Chair Lower Body Dressing: Minimal assistance Where Assessed-Lower Body Dressing: Chair Toileting: Contact guard Where Assessed-Toileting: Glass blower/designer: Therapist, music Method: Counselling psychologist: Energy manager: Environmental education officer Method: Heritage manager: Actor, Grab bars   Therapy/Group: Individual Therapy  Gypsy Decant 03/06/2020, 4:07 PM

## 2020-03-06 NOTE — Progress Notes (Signed)
[place] PHYSICAL MEDICINE & REHABILITATION PROGRESS NOTE   Subjective/Complaints:  Pt upset he doesn't think speech is doing better- thinks might have to work on it outside therapy- which sounds appropriate.  No side effects from Amantadine.   Still sleeping well.   ROS:  Pt denies SOB, abd pain, CP, N/V/C/D, and vision changes   Objective:   No results found. No results for input(s): WBC, HGB, HCT, PLT in the last 72 hours. No results for input(s): NA, K, CL, CO2, GLUCOSE, BUN, CREATININE, CALCIUM in the last 72 hours.  Intake/Output Summary (Last 24 hours) at 03/06/2020 1053 Last data filed at 03/06/2020 0806 Gross per 24 hour  Intake 361 ml  Output 1500 ml  Net -1139 ml     Physical Exam: Vital Signs Blood pressure 117/85, pulse (!) 54, temperature 97.6 F (36.4 C), temperature source Oral, resp. rate 16, height 6\' 2"  (1.88 m), weight 113 kg, SpO2 100 %. Constitutional: sitting up EOB again, appropriate, flat affect, NAD HENT: Normocephalic.  Atraumatic.; speech slowed, occ slurred, not a lot said to determine speech issues Eyes: conjugate gaze Cardiovascular: RRR Respiratory: CTA B/L- no W/R/R- good air movement GI: Soft, NT, ND, (+)BS  Skin: Warm and dry.  Intact. Psych:flat affect Musc: Right shoulder limited range of motion due to RTC injury (baseline) Neurological: Alert. Very little spontaneous speech- says it's not getting better, but not practicing.  Following 2 step commands Motor: RUE/RLE: 5/5 proximal to distal LUE/LLE: 4+-5/5 proximal to distal with apraxia  Assessment/Plan: 1. Functional deficits secondary to [place] with L hemiparesis  which require 3+ hours per day of interdisciplinary therapy in a comprehensive inpatient rehab setting.  Physiatrist is providing close team supervision and 24 hour management of active medical problems listed below.  Physiatrist and rehab team continue to assess barriers to discharge/monitor patient progress toward  functional and medical goals  Care Tool:  Bathing    Body parts bathed by patient: Right arm, Left arm, Chest, Abdomen, Front perineal area, Right upper leg, Buttocks, Left upper leg, Right lower leg, Left lower leg, Face         Bathing assist Assist Level: Supervision/Verbal cueing     Upper Body Dressing/Undressing Upper body dressing   What is the patient wearing?: Pull over shirt    Upper body assist Assist Level: Set up assist    Lower Body Dressing/Undressing Lower body dressing      What is the patient wearing?: Underwear/pull up, Pants     Lower body assist Assist for lower body dressing: Supervision/Verbal cueing     Toileting Toileting    Toileting assist Assist for toileting: Contact Guard/Touching assist Assistive Device Comment: urinal   Transfers Chair/bed transfer  Transfers assist     Chair/bed transfer assist level: Independent     Locomotion Ambulation   Ambulation assist      Assist level: Contact Guard/Touching assist Assistive device: No Device Max distance: 533'   Walk 10 feet activity   Assist     Assist level: Contact Guard/Touching assist Assistive device: No Device   Walk 50 feet activity   Assist Walk 50 feet with 2 turns activity did not occur: Safety/medical concerns(L hemiparesis, decreased balance, fatigue)  Assist level: Contact Guard/Touching assist Assistive device: No Device    Walk 150 feet activity   Assist Walk 150 feet activity did not occur: Safety/medical concerns(L hemiparesis, decreased balance, fatigue)  Assist level: Contact Guard/Touching assist Assistive device: No Device    Walk 10 feet  on uneven surface  activity   Assist Walk 10 feet on uneven surfaces activity did not occur: Safety/medical concerns(L hemiparesis, L foot drag, decreased balance, fatigue)         Wheelchair     Assist Will patient use wheelchair at discharge?: No Type of Wheelchair: Manual     Wheelchair assist level: Supervision/Verbal cueing Max wheelchair distance: 89ft    Wheelchair 50 feet with 2 turns activity    Assist        Assist Level: Moderate Assistance - Patient 50 - 74%   Wheelchair 150 feet activity     Assist      Assist Level: Maximal Assistance - Patient 25 - 49%   Blood pressure 117/85, pulse (!) 54, temperature 97.6 F (36.4 C), temperature source Oral, resp. rate 16, height 6\' 2"  (1.88 m), weight 113 kg, SpO2 100 %.  Medical Problem List and Plan: 1.  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   Continue [name]  4/20- will try adding Amantadine 100 mg daily for now and make sure can tolerate, then increase.   4/21- tolerated well- no side effects- will increase Friday to 200 mg in AM  4/22- if tolerates well, will increase in AM 2.  Antithrombotics: -DVT/anticoagulation:  Mechanical: Sequential compression devices, below knee Bilateral lower extremities             -antiplatelet therapy: N/a due to bleed  3. Chronic back pain/Pain Management: uses Oxymorphone 10 mg bid per Dr. Brien Few pain management--> MS contin 15 mg bid as substitute.  4/21- pain controlled- con't meds  Monitor with increased exertion 4. Mood: LCSW to follow for evaluation and support.              -antipsychotic agents: N/A 5. Neuropsych: This patient is capable of making decisions on his own behalf.  Neuropsych consulted 6. Skin/Wound Care: Routine pressure relief measures.  7. Fluids/Electrolytes/Nutrition: Monitor I/O.   8. HTN: Monitor qid--continue Avapro.   Norvasc DC'd  4/20- BP soft still at 104/73- pulse 64- will con't to monitor  4/21- BP 91/65- very soft/low- stop Avapro since on Flomax AND amantadine- both can lower BP- can restart after can get him off those meds.   4/22- BP 117/85- much better- con't meds             Monitor with increased mobility. 9.  Left thyroid nodule: Thyroid ultrasound on outpatient basis?    4/15- will let PCP know and arrange outpatient 10. Low grade fevers: Resolved encourage IS. Continue to monitor vital signs/labs. 11. Barrett's esophagus: On PPI but still has intermittent issues with GERD.  Changed to Nexium/Esomeprazole 40 mg daily since pt still having GERD Sx's/GI pain. 12. 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 Urinary retention  Continue Flomax 0.4 mg nightly to get pt to void  4/21- pt isn't retaining anymore, per staff  4/22- not documented that needing cathing.   I/O caths as necessary 14.  Sleep disturbance  Appears to be related to anxiety/depression  Continue trazodone  4/20- increased trazodone to 150 mg QHS-   4/22- slept great with increase in meds   LOS: 8 days A FACE TO FACE EVALUATION WAS PERFORMED  Donabelle Molden 03/06/2020, 10:53 AM

## 2020-03-07 MED ORDER — TAMSULOSIN HCL 0.4 MG PO CAPS: 0.4000 mg | ORAL_CAPSULE | Freq: Every day | ORAL | 1 refills | Status: DC

## 2020-03-07 MED ORDER — AMANTADINE HCL 100 MG PO CAPS: 200.0000 mg | ORAL_CAPSULE | Freq: Every day | ORAL | 0 refills | Status: DC

## 2020-03-07 MED ORDER — TRAZODONE HCL 150 MG PO TABS: 150.0000 mg | ORAL_TABLET | Freq: Every day | ORAL | 0 refills | Status: DC

## 2020-03-07 MED ORDER — SENNOSIDES-DOCUSATE SODIUM 8.6-50 MG PO TABS: 1.0000 | ORAL_TABLET | Freq: Two times a day (BID) | ORAL | Status: DC

## 2020-03-07 MED ORDER — MELATONIN 300 MCG PO TABS: 1.5000 mg | ORAL_TABLET | Freq: Every day | ORAL | 0 refills | Status: DC

## 2020-03-07 NOTE — Discharge Instructions (Signed)
Inpatient Rehab Discharge Instructions  Richard Vaughn Discharge date and time: 03/07/20   Activities/Precautions/ Functional Status: Activity: no lifting, driving, or strenuous exercise till cleared by MD Diet: cardiac diet Wound Care: none needed    Functional status:  ___ No restrictions     ___ Walk up steps independently _X__ 24/7 supervision/assistance   ___ Walk up steps with assistance ___ Intermittent supervision/assistance  ___ Bathe/dress independently ___ Walk with walker     ___ Bathe/dress with assistance ___ Walk Independently    ___ Shower independently ___ Walk with assistance    _X__ Shower with assistance _X__ No alcohol     ___ Return to work/school ________   Special Instructions:  STROKE/TIA DISCHARGE INSTRUCTIONS SMOKING Cigarette smoking nearly doubles your risk of having a stroke & is the single most alterable risk factor  If you smoke or have smoked in the last 12 months, you are advised to quit smoking for your health.  Most of the excess cardiovascular risk related to smoking disappears within a year of stopping.  Ask you doctor about anti-smoking medications  [place] Quit Line: 1-800-QUIT NOW  Free Smoking Cessation Classes [phone]  CHOLESTEROL Know your levels; limit fat & cholesterol in your diet  Lipid Panel     Component Value Date/Time   CHOL 125 02/22/2020 2100   CHOL 142 05/15/2018 1329   TRIG 86 02/22/2020 2100   TRIG 121 11/01/2006 1056   HDL 35 (L) 02/22/2020 2100   HDL 30 (L) 05/15/2018 1329   CHOLHDL 3.6 02/22/2020 2100   VLDL 17 02/22/2020 2100   [place] 73 02/22/2020 2100   [place] 87 05/15/2018 1329      Many patients benefit from treatment even if their cholesterol is at goal.  Goal: Total Cholesterol (CHOL) less than 160  Goal:  Triglycerides (TRIG) less than 150  Goal:  HDL greater than 40  Goal:  LDL (LDLCALC) less than 100   BLOOD PRESSURE American Stroke Association blood pressure target is less that 120/80  mm/Hg  Your discharge blood pressure is:  BP: 92/78  Monitor your blood pressure  Limit your salt and alcohol intake  Many individuals will require more than one medication for high blood pressure  DIABETES (A1c is a blood sugar average for last 3 months) Goal HGBA1c is under 7% (HBGA1c is blood sugar average for last 3 months)  Diabetes: No known diagnosis of diabetes    Lab Results  Component Value Date   HGBA1C 5.4 02/22/2020     Your HGBA1c can be lowered with medications, healthy diet, and exercise.  Check your blood sugar as directed by your physician  Call your physician if you experience unexplained or low blood sugars.  PHYSICAL ACTIVITY/REHABILITATION Goal is 30 minutes at least 4 days per week  Activity: No driving, Therapies: See below Return to work: N/A  Activity decreases your risk of heart attack and stroke and makes your heart stronger.  It helps control your weight and blood pressure; helps you relax and can improve your mood.  Participate in a regular exercise program.  Talk with your doctor about the best form of exercise for you (dancing, walking, swimming, cycling).  DIET/WEIGHT Goal is to maintain a healthy weight  Your discharge diet is:  Diet Order            Diet Heart Room service appropriate? Yes; Fluid consistency: Thin  Diet effective now            liquids Your  height is:  Height: 6\' 2"  (188 cm) Your current weight is: Weight: 248.9 lbs Your Body Mass Index (BMI) is:  BMI (Calculated): 31.97  Following the type of diet specifically designed for you will help prevent another stroke.  Your goal weight is:  194 lbs  Your goal Body Mass Index (BMI) is 19-24.  Healthy food habits can help reduce 3 risk factors for stroke:  High cholesterol, hypertension, and excess weight.  RESOURCES Stroke/Support Group:  Call [phone]   STROKE EDUCATION PROVIDED/REVIEWED AND GIVEN TO PATIENT Stroke warning signs and symptoms How to activate  emergency medical system (call 911). Medications prescribed at discharge. Need for follow-up after discharge. Personal risk factors for stroke. Pneumonia vaccine given:  Flu vaccine given:  My questions have been answered, the writing is legible, and I understand these instructions.  I will adhere to these goals & educational materials that have been provided to me after my discharge from the hospital.      [facility]:    Outpatient: PT     OT   ST             Agency: [facility] Phone: [phone]              Appointment Date/Time:*Please expect follow-up within 3-5 business days to schedule your appointment.*  Medical Equipment/Items Ordered: shower chair (private purchase by family)                                                 Agency/Supplier:     My questions have been answered and I understand these instructions. I will adhere to these goals and the provided educational materials after my discharge from the hospital.  Patient/Caregiver Signature _______________________________ Date __________  Clinician Signature _______________________________________ Date __________  Please bring this form and your medication list with you to all your follow-up doctor's appointments.

## 2020-03-07 NOTE — Progress Notes (Signed)
Social Work Patient ID: Richard Vaughn, male   DOB: [birth year], 69 y.o.   MRN: [id number]    SW confirmed with [facility] referral received for outpatient PT/OT/ST.  Loralee Pacas, MSW, [place] Office: [phone] Cell: [phone] Fax: [phone]

## 2020-03-07 NOTE — Progress Notes (Signed)
Social Work Discharge Note   The overall goal for the admission was met for:   Discharge location: Yes. D/c to home with his wife.  Length of Stay: Yes. 9 days.  Discharge activity level: Yes. 24/7 care due to cognition.  Home/community participation: Yes. Limited.  Services provided included: MD, RD, PT, OT, SLP, RN, CM, TR, Pharmacy, Neuropsych and SW  Financial Services: Medicare  Follow-up services arranged: Outpatient: [facility] for PT/OT/ST and DME: shower chair- wife purhcased item  Comments (or additional information): pt wife [name] [phone])   Patient/Family verbalized understanding of follow-up arrangements: Yes  Individual responsible for coordination of the follow-up plan: Pt will have assistance form his wife with coordinating care needs.   Confirmed correct DME delivered: Rana Snare 03/07/2020    Rana Snare

## 2020-03-07 NOTE — Progress Notes (Signed)
[place] PHYSICAL MEDICINE & REHABILITATION PROGRESS NOTE   Subjective/Complaints:  Pt reported didn't sleep well due to mattress- was uncomfortable.  Otherwise, feels like speech a little more clear- I do as well.  Ready for d/c today- explained will f/u with PM&R in a few weeks and will get a call to schedule..     ROS:   Pt denies SOB, abd pain, CP, N/V/C/D, and vision changes   Objective:   No results found. No results for input(s): WBC, HGB, HCT, PLT in the last 72 hours. No results for input(s): NA, K, CL, CO2, GLUCOSE, BUN, CREATININE, CALCIUM in the last 72 hours.  Intake/Output Summary (Last 24 hours) at 03/07/2020 0947 Last data filed at 03/07/2020 0453 Gross per 24 hour  Intake --  Output 550 ml  Net -550 ml     Physical Exam: Vital Signs Blood pressure 92/78, pulse (!) 54, temperature (!) 97.5 F (36.4 C), temperature source Oral, resp. rate 16, height 6\' 2"  (1.88 m), weight 113 kg, SpO2 98 %. Constitutional: sitting up in bedside chair, NAD HENT: Normocephalic.  Atraumatic.; speech still slow- more articulate when speaks though Eyes: conjugate gaze Cardiovascular: RRR Respiratory: CTA B/L- no W/R/R- good air movement GI: Soft, NT, ND, (+)BS   Skin: Warm and dry.  Intact. Psych:appropriate, cordial, flat Musc: Right shoulder limited range of motion due to RTC injury (baseline) Neurological: Alert. Very little spontaneous speech- says it's not getting better, but not practicing.  Following 2 step commands Motor: RUE/RLE: 5/5 proximal to distal LUE/LLE: 4+-5/5 proximal to distal with apraxia  Assessment/Plan: 1. Functional deficits secondary to [place] with L hemiparesis  which require 3+ hours per day of interdisciplinary therapy in a comprehensive inpatient rehab setting.  Physiatrist is providing close team supervision and 24 hour management of active medical problems listed below.  Physiatrist and rehab team continue to assess barriers to  discharge/monitor patient progress toward functional and medical goals  Care Tool:  Bathing    Body parts bathed by patient: Right arm, Left arm, Chest, Abdomen, Front perineal area, Right upper leg, Buttocks, Left upper leg, Right lower leg, Left lower leg, Face         Bathing assist Assist Level: Set up assist     Upper Body Dressing/Undressing Upper body dressing   What is the patient wearing?: Pull over shirt    Upper body assist Assist Level: Set up assist    Lower Body Dressing/Undressing Lower body dressing      What is the patient wearing?: Underwear/pull up, Pants     Lower body assist Assist for lower body dressing: Set up assist     Toileting Toileting    Toileting assist Assist for toileting: Supervision/Verbal cueing Assistive Device Comment: urinal   Transfers Chair/bed transfer  Transfers assist     Chair/bed transfer assist level: Independent with assistive device     Locomotion Ambulation   Ambulation assist      Assist level: Supervision/Verbal cueing Assistive device: Walker-rolling Max distance: >250'   Walk 10 feet activity   Assist     Assist level: Supervision/Verbal cueing Assistive device: Walker-rolling   Walk 50 feet activity   Assist Walk 50 feet with 2 turns activity did not occur: Safety/medical concerns(L hemiparesis, decreased balance, fatigue)  Assist level: Supervision/Verbal cueing Assistive device: Walker-rolling    Walk 150 feet activity   Assist Walk 150 feet activity did not occur: Safety/medical concerns(L hemiparesis, decreased balance, fatigue)  Assist level: Supervision/Verbal cueing Assistive device:  Walker-rolling    Walk 10 feet on uneven surface  activity   Assist Walk 10 feet on uneven surfaces activity did not occur: Safety/medical concerns(L hemiparesis, L foot drag, decreased balance, fatigue)   Assist level: Contact Guard/Touching assist Assistive device: Horticulturist, commercial Will patient use wheelchair at discharge?: No(patient is a functional ambulator) Type of Wheelchair: Manual Wheelchair activity did not occur: N/A  Wheelchair assist level: Supervision/Verbal cueing Max wheelchair distance: 60ft    Wheelchair 50 feet with 2 turns activity    Assist    Wheelchair 50 feet with 2 turns activity did not occur: N/A   Assist Level: Moderate Assistance - Patient 50 - 74%   Wheelchair 150 feet activity     Assist  Wheelchair 150 feet activity did not occur: N/A   Assist Level: Maximal Assistance - Patient 25 - 49%   Blood pressure 92/78, pulse (!) 54, temperature (!) 97.5 F (36.4 C), temperature source Oral, resp. rate 16, height 6\' 2"  (1.88 m), weight 113 kg, SpO2 98 %.  Medical Problem List and Plan: 1.  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   Continue [name]  4/20- will try adding Amantadine 100 mg daily for now and make sure can tolerate, then increase.   4/21- tolerated well- no side effects- will increase Friday to 200 mg in AM  4/22- if tolerates well, will increase in AM  4/23- wrote to go home on 200 mg amantadine 2.  Antithrombotics: -DVT/anticoagulation:  Mechanical: Sequential compression devices, below knee Bilateral lower extremities             -antiplatelet therapy: N/a due to bleed  3. Chronic back pain/Pain Management: uses Oxymorphone 10 mg bid per Dr. Brien Few pain management--> MS contin 15 mg bid as substitute.  4/21- pain controlled- con't meds  Monitor with increased exertion 4. Mood: LCSW to follow for evaluation and support.              -antipsychotic agents: N/A 5. Neuropsych: This patient is capable of making decisions on his own behalf.  Neuropsych consulted 6. Skin/Wound Care: Routine pressure relief measures.  7. Fluids/Electrolytes/Nutrition: Monitor I/O.   8. HTN: Monitor qid--continue Avapro.   Norvasc DC'd  4/20- BP soft still at  104/73- pulse 64- will con't to monitor  4/21- BP 91/65- very soft/low- stop Avapro since on Flomax AND amantadine- both can lower BP- can restart after can get him off those meds.   4/22- BP 117/85- much better- con't meds 4/23 BP 92/78             Monitor with increased mobility. 9.  Left thyroid nodule: Thyroid ultrasound on outpatient basis?   4/15- will let PCP know and arrange outpatient 10. Low grade fevers: Resolved encourage IS. Continue to monitor vital signs/labs. 11. Barrett's esophagus: On PPI but still has intermittent issues with GERD.  Changed to Nexium/Esomeprazole 40 mg daily since pt still having GERD Sx's/GI pain. 12. 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 Urinary retention  Continue Flomax 0.4 mg nightly to get pt to void  4/21- pt isn't retaining anymore, per staff  4/22- not documented that needing cathing.   I/O caths as necessary 14.  Sleep disturbance  Appears to be related to anxiety/depression  Continue trazodone  4/20- increased trazodone to 150 mg QHS-   4/22- slept great with increase in meds   4/23-  bed uncomfortable  15. Dispo  4/23- d/c today-    LOS: 9 days A FACE TO FACE EVALUATION WAS PERFORMED  Richard Vaughn 03/07/2020, 9:47 AM

## 2020-03-07 NOTE — Discharge Summary (Signed)
Physician Discharge Summary  Patient ID: Richard Vaughn MRN: [id number] DOB/AGE: 03-29-1951 69 y.o.  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 HTN Active Problems:   Left thyroid nodule   Depressive reaction   Sleep disturbance   Bradycardia   Essential hypertension   Discharged Condition:  stable  Significant Diagnostic Studies: N/A   Labs:  Basic Metabolic Panel: BMP Latest Ref Rng & Units 03/03/2020 02/28/2020 02/27/2020  Glucose 70 - 99 mg/dL 106(H) 87 92  BUN 8 - 23 mg/dL 14 15 13   Creatinine 0.61 - 1.24 mg/dL 1.14 1.08 0.99  Sodium 135 - 145 mmol/L 139 137 138  Potassium 3.5 - 5.1 mmol/L 3.8 4.3 4.0  Chloride 98 - 111 mmol/L 104 106 105  CO2 22 - 32 mmol/L 26 21(L) 24  Calcium 8.9 - 10.3 mg/dL 9.2 9.2 9.0    CBC: CBC Latest Ref Rng & Units 02/28/2020 02/24/2020 02/22/2020  WBC 4.0 - 10.5 K/uL 8.1 7.3 -  Hemoglobin 13.0 - 17.0 g/dL 15.7 15.0 13.6  Hematocrit 39.0 - 52.0 % 46.0 43.9 40.0  Platelets 150 - 400 K/uL 164 179 -    CBG: No results for input(s): GLUCAP in the last 168 hours.  Brief HPI:   Richard Vaughn is a 69 y.o. male with history of Barrett's esophagus with GERD, history of esophageal stricture, HTN, chronic back pain, hypertensive bleeds--last 04/2018, OSA who was admitted on 02/22/2020 with headache, visual changes, left hemiparesis and slurred speech.  CT head showed acute right thalamic hemorrhage with mild surrounding edema and min mass-effect as well as advanced chronic small vessel disease.  CTA head/neck was negative for LVO and incidental finding of 14 mm left thyroid nodule noted with recommendation for dedicated thyroid ultrasound.  MRI brain revealed right thalamic IPH with mild edema and 3 mm localized right-to-left shift.  Dr. Leonie Man felt that thalamic hemorrhage was hypertensive in nature.  Patient has had issues with low-grade fevers felt  to be due to viral bronchopneumonia and has been monitored.  Therapy ongoing and patient limited by LLE weakness with ataxia, dysarthria and LUE weakness affecting ADLs and mobility.  [name] recommended due to functional decline   Hospital Course: Richard Vaughn was admitted to rehab 02/27/2020 for inpatient therapies to consist of PT, ST and OT at least three hours five days a week. Past admission physiatrist, therapy team and rehab RN have worked together to provide customized collaborative inpatient rehab.  Chronic back pain has been managed with MS Contin as substitute.  He is blood pressures were monitored on TID basis and were noted to be low therefore Norvasc and Avapro was discontinued with resolution of hypotension. He continues to have asymptomatic bradycardia with heart rate occasionally down to 54 bpm. Flomax was added due to issues with retention and he is voiding without difficult.  He has had issues with sleep disturbance due to acute on chronic insomnia and trazodone was added and titrated up to 150 mg at bedtime.  He is continent of bowel and bladder. He has made gains during rehab stay and is currently at supervision level.  He/She will continue to receive follow up outpatient PT,OT and ST at [facility] after discharge   Rehab course: During patient's stay in rehab weekly team conferences were held to monitor patient's progress, set goals and discuss barriers to discharge. At admission, patient required mod assist with mobility and  min assist with basic ADLs.  He exhibited moderate dysarthria with cues for clarification and to utilize strategies.  He  has had improvement in activity tolerance, balance, postural control as well as ability to compensate for deficits. He is able to complete ADL tasks with supervision. He requires supervision for transfers and to ambulate >250' with RW and supervision. He requires supervisory cues to utilize verbal cues for speech intelligibility at sentence  level. Family education was completed regarding all aspects of care and safety.   Discharge disposition: 01-Home or Self Care  Diet: Heart healthy.   Special Instructions: 1.  Recommend thyroid ultrasound to evaluate left thyroid nodule. 2.  No driving or strenuous activity till cleared by MD  Discharge Instructions    Ambulatory referral to Occupational Therapy   Complete by: As directed    Evaluation and treat   Ambulatory referral to Physical Therapy   Complete by: As directed    Evaluation and treat   Ambulatory referral to Speech Therapy   Complete by: As directed    Evaluation and treat     Allergies as of 03/07/2020   No Known Allergies     Medication List    STOP taking these medications   amLODipine 2.5 MG tablet Commonly known as: NORVASC   olmesartan 20 MG tablet Commonly known as: BENICAR     TAKE these medications   amantadine 100 MG capsule Commonly known as: SYMMETREL Take 2 capsules (200 mg total) by mouth daily.   esomeprazole 40 MG capsule Commonly known as: NexIUM Take 1 capsule (40 mg total) by mouth 2 (two) times daily before a meal. Please schedule a yearly follow up for any further refills.  Last seen 01-2019. What changed: additional instructions   Melatonin 300 MCG Tabs Take 5 tablets (1,500 mcg total) by mouth at bedtime. Notes to patient: Over the counter   Opana 10 MG tablet Generic drug: oxymorphone Take 10 mg by mouth in the morning and at bedtime.   senna-docusate 8.6-50 MG tablet Commonly known as: Senokot-S Take 1 tablet by mouth 2 (two) times daily. Notes to patient: Over the counter for constipation   tamsulosin 0.4 MG Caps capsule Commonly known as: FLOMAX Take 1 capsule (0.4 mg total) by mouth daily after supper. Notes to patient: For prostate/voiding   traZODone 150 MG tablet Commonly known as: DESYREL Take 1 tablet (150 mg total) by mouth at bedtime. What changed:   medication strength  See the new  instructions. Notes to patient: For insomnia      Follow-up Information    Martinique, [name], MD. Call on 03/10/2020.   Specialty: Family Medicine Why: For post hospital follow up Contact information: [place] [address] [phone]        Courtney Heys, MD Follow up.   Specialty: Physical Medicine and Rehabilitation Why: Office will call you with follow up appointment Contact information: [id number] [place] [address] [phone]        [facility] Follow up.   Why: Call next week if you have not heard from them--stroke follow up Contact information: [address]     [place] [phone]          Signed: Bary Leriche 03/12/2020, 12:33 PM

## 2020-03-07 NOTE — Progress Notes (Signed)
Patient and spouse received discharge instructions from Algis Liming, PA-C with verbal understanding. Patient being discharged to home with spouse and patient belongings. All questions asked and answered.

## 2020-03-10 ENCOUNTER — Other Ambulatory Visit: Payer: Self-pay

## 2020-03-10 ENCOUNTER — Telehealth: Payer: Self-pay | Admitting: *Deleted

## 2020-03-10 ENCOUNTER — Ambulatory Visit: Payer: Medicare Other | Attending: Family Medicine

## 2020-03-10 DIAGNOSIS — R2681 Unsteadiness on feet: Secondary | ICD-10-CM | POA: Insufficient documentation

## 2020-03-10 DIAGNOSIS — R2689 Other abnormalities of gait and mobility: Secondary | ICD-10-CM | POA: Diagnosis not present

## 2020-03-10 DIAGNOSIS — R41841 Cognitive communication deficit: Secondary | ICD-10-CM | POA: Insufficient documentation

## 2020-03-10 DIAGNOSIS — M6281 Muscle weakness (generalized): Secondary | ICD-10-CM

## 2020-03-10 DIAGNOSIS — I612 Nontraumatic intracerebral hemorrhage in hemisphere, unspecified: Secondary | ICD-10-CM | POA: Insufficient documentation

## 2020-03-10 DIAGNOSIS — R471 Dysarthria and anarthria: Secondary | ICD-10-CM | POA: Insufficient documentation

## 2020-03-10 NOTE — Telephone Encounter (Signed)
Transition Care Management Follow-up Telephone Call   Date discharged? 03/07/2020   How have you been since you were released from the hospital? Richard Vaughn been doing okay    Do you understand why you were in the hospital? yes   Do you understand the discharge instructions? no   Where were you discharged to? Home    Items Reviewed:  Medications reviewed: yes  Allergies reviewed: NKA (no known allergies)  Dietary changes reviewed: yes  Referrals reviewed: yes   Functional Questionnaire:   Activities of Daily Living (ADLs):   He states they are independent in the following: None  States they require assistance with the following: ambulation, bathing and hygiene, feeding, continence, grooming, toileting and dressing   Any transportation issues/concerns?: no   Any patient concerns? Yes. Should patient still take his Naproxen?     Confirmed importance and date/time of follow-up visits scheduled yes  Provider Appointment booked with Dr. Martinique 03/17/2020 at 11AM  Confirmed with patient if condition begins to worsen call PCP or go to the ER.  Patient was given the office number and encouraged to call back with question or concerns.  : yes

## 2020-03-10 NOTE — Therapy (Signed)
[facility] [address] [place], [place], [address] Phone: [phone]   Fax:  [phone]  Physical Therapy Evaluation  Patient Details  Name: Richard Vaughn MRN: [id number] Date of Birth: 11-Oct-1951 Referring Provider (PT): Reesa Chew PA-C   Encounter Date: 03/10/2020  PT End of Session - 03/10/20 1415    Visit Number  1    Number of Visits  13    Date for PT Re-Evaluation  06/08/20   POC for 6 weeks, Cert for 90 days   Authorization Type  Medicare + [employer] (10th visit PN)    Progress Note Due on Visit  10    PT Start Time  [id number]   patient arriving late   PT Stop Time  1407    PT Time Calculation (min)  44 min    Equipment Utilized During Treatment  Gait belt    Activity Tolerance  Patient tolerated treatment well    Behavior During Therapy  WFL for tasks assessed/performed       Past Medical History:  Diagnosis Date  . Barrett's esophagus   . Chronic back pain   . Gastritis    Mild  . Gastroparesis   . GERD (gastroesophageal reflux disease)   . Headache   . Hyperglycemia   . Hypertension   . Nephrolithiasis   . Renal cyst   . Stricture and stenosis of esophagus   . Stroke ([place]) 07-2015  . Vision abnormalities     Past Surgical History:  Procedure Laterality Date  . [place] SURGERY  2012  . COLONOSCOPY    . [place] SURGERY  2005, 2010   replaced L4 and L5  . UPPER GASTROINTESTINAL ENDOSCOPY      There were no vitals filed for this visit.   Subjective Assessment - 03/10/20 1325    Subjective  Patient and wife reporting that he had a stroke in early april (April 9th), in which he was hospitalized and went to inpatient rehab. Was discharged home on 03/07/2020. Reports that he has been doing "okay" since being home. Has been able to get around the house with the RW, and/or his wifes help. Patient reports he used the walker, but does not like it. Reports he is mostly having left sided  weakness in both the upper and lower extremity, no reports of altered sensation. Stated he did recieved PT services here prior for a previous stroke in 2019. No reports of pain, does state he does have chronic low back that often bothers him.    Patient is accompained by:  Family member   Wife   Pertinent History  Chronic Back Pain, Barrett's Esophagus, GERD, Hyperglycemia, HTN, CVA, Hyperlipidemia, vision abnormalities, ICH in the right thalamus/posterior limb of internal capsule    Limitations  Standing;Walking;House hold activities    Patient Stated Goals  "want to walk", "get back to how I was"    Currently in Pain?  No/denies         [facility] PT Assessment - 03/10/20 1330      Assessment   Medical Diagnosis  R Thalamic Hemorrhage    Referring Provider (PT)  Reesa Chew PA-C    Onset Date/Surgical Date  02/22/20    Hand Dominance  Right    Next MD Visit  Appointment with Dr. Martinique on 03/17/20    Prior Therapy  Inpatient Rehab      Precautions   Precautions  Fall      Restrictions  Weight Bearing Restrictions  No      Balance Screen   Has the patient fallen in the past 6 months  No    Has the patient had a decrease in activity level because of a fear of falling?   No    Is the patient reluctant to leave their home because of a fear of falling?   No      Home Environment   Living Environment  Private residence    Living Arrangements  Spouse/significant other    Available Help at Discharge  Family    Type of [place] to enter    Entrance Stairs-Number of Steps  3    Entrance Stairs-Rails  Right;Left;Can reach both    [place]  Two level    Alternate Level Stairs-Number of Steps  12-13    [place] - 2 wheels;Cane - single point;Shower seat      Prior Function   Level of Independence  Independent;Independent with community mobility without device;Independent with household mobility without  device    Vocation  Retired    Leisure  Copy   Overall Cognitive Status  Within Functional Limits for tasks assessed      Sensation   Light Touch  Appears Intact      Coordination   Gross Motor Movements are Fluid and Coordinated  No    Coordination and Movement Description  grossly uncoordinated due to hemiparesis and decreased postural control with gait actvities    Heel Shin Test  [facility]      Posture/Postural Control   Posture/Postural Control  Postural limitations    Postural Limitations  Rounded Shoulders;Forward head      ROM / Strength   AROM / PROM / Strength  AROM;Strength      AROM   Overall AROM   Within functional limits for tasks performed    AROM Assessment Site  --    Right/Left Hip  --    Right/Left Knee  --    Right/Left Ankle  --      Strength   Overall Strength  Within functional limits for tasks performed    Strength Assessment Site  Hip;Knee;Ankle    Right/Left Hip  Right;Left    Right Hip Flexion  5/5    Right Hip ABduction  4+/5    Right Hip ADduction  5/5    Left Hip Flexion  4+/5    Left Hip ABduction  4+/5    Left Hip ADduction  4+/5    Right/Left Knee  Right;Left    Right Knee Flexion  5/5    Right Knee Extension  5/5    Left Knee Flexion  4+/5    Left Knee Extension  4+/5    Right/Left Ankle  Right;Left    Right Ankle Dorsiflexion  4+/5    Left Ankle Dorsiflexion  4+/5      Transfers   Transfers  Sit to Stand;Stand to Sit;Stand Pivot Transfers    Sit to Stand  5: Supervision    Sit to Stand Details  Verbal cues for safe use of DME/AE;Verbal cues for technique    Sit to Stand Details (indicate cue type and reason)  completed from standard height chair, require multiple attempts to rise.    Five time sit to stand comments   36.06 secs, w/ UE use from standard height  chair    Stand to Sit  5: Supervision    Stand Pivot Transfers  5: Supervision    Stand Pivot Transfer Details (indicate cue type and reason)  patient  requiring increased cueing for stand pivot transfer with RW.       Ambulation/Gait   Ambulation/Gait  Yes    Ambulation/Gait Assistance  5: Supervision;4: Min guard    Ambulation/Gait Assistance Details  supv and verbal cues for safety. Patient demonstrating inproper use of DME with turns during gait    Ambulation Distance (Feet)  115 Feet    Assistive device  Rolling walker    Gait Pattern  Step-through pattern;Decreased arm swing - right;Decreased arm swing - left;Decreased stance time - left;Decreased step length - left    Ambulation Surface  Level;Indoor    Gait velocity  24.09 secs = 1.36 ft/sec      Standardized Balance Assessment   Standardized Balance Assessment  Timed Up and Go Test;Berg Balance Test      Berg Balance Test   Sit to Stand  Able to stand using hands after several tries    Standing Unsupported  Able to stand 2 minutes with supervision    Sitting with Back Unsupported but Feet Supported on Floor or Stool  Able to sit safely and securely 2 minutes    Stand to Sit  Controls descent by using hands    Transfers  Able to transfer with verbal cueing and /or supervision    Standing Unsupported with Eyes Closed  Able to stand 10 seconds with supervision    Standing Unsupported with Feet Together  Able to place feet together independently and stand for 1 minute with supervision    From Standing, Reach Forward with Outstretched Arm  Can reach forward >12 cm safely (5")    From Standing Position, Pick up Object from [place] to pick up shoe, needs supervision    From Standing Position, Turn to Look Behind Over each Shoulder  Looks behind one side only/other side shows less weight shift    Turn 360 Degrees  Able to turn 360 degrees safely but slowly    Standing Unsupported, Alternately Place Feet on Step/Stool  Able to complete 4 steps without aid or supervision    Standing Unsupported, One Foot in Front  Able to take small step independently and hold 30 seconds    Standing  on One Leg  Tries to lift leg/unable to hold 3 seconds but remains standing independently    Total Score  36    Berg comment:  36/56 = High Risk for Falls      Timed Up and Go Test   TUG  Normal TUG    Normal TUG (seconds)  37.44    TUG Comments  TUG completed with RW and supv with intermittent CGA from PT for safety.                 Objective measurements completed on examination: See above findings.              PT Education - 03/10/20 1415    Education Details  Patient educated on evaluation findings and POC    Person(s) Educated  Patient;Spouse    Methods  Explanation    Comprehension  Verbalized understanding       PT Short Term Goals - 03/10/20 1430      PT SHORT TERM GOAL #1   Title  Patient will be indepdent with initial HEP  Baseline  HEP not yet initiated    Time  3    Period  Weeks    Status  New    Target Date  03/31/20      PT SHORT TERM GOAL #2   Title  Patient will improve 5x sit <> stand to < 30 seconds w/ UE use to demonstrate improved BLE strength    Baseline  36.06 secs w/ UE    Time  3    Period  Weeks    Status  New    Target Date  03/31/20      PT SHORT TERM GOAL #3   Title  Patient will improve TUG to <32 seconds for improved functional mobility    Baseline  37.44 sec    Time  3    Period  Weeks    Status  New    Target Date  03/31/20        PT Long Term Goals - 03/10/20 1435      PT LONG TERM GOAL #1   Title  Patient will be independent with final HEP    Time  6    Period  Weeks    Status  New    Target Date  04/21/20      PT LONG TERM GOAL #2   Title  Patient will improve TUG time to </= 25 secs with LRAD to reduce risk for falls    Baseline  37.44    Time  6    Period  Weeks    Status  New    Target Date  04/21/20      PT LONG TERM GOAL #3   Title  Pt will improve gait speed to >/=2.0 ft/sec. with LRAD to safely amb. in the community.    Baseline  1.36 ft    Time  6    Period  Weeks    Status  New     Target Date  04/21/20      PT LONG TERM GOAL #4   Title  Pt will improve BERG score to >/=45/56 to decr. falls risk.     Baseline  36/56    Time  6    Period  Weeks    Status  New    Target Date  04/21/20      PT LONG TERM GOAL #5   Title  Pt will amb. 600' over level surfaces with LRAD at MOD I level to demonstrate improved functional mobility.    Time  6    Period  Weeks    Status  New    Target Date  04/21/20      Additional Long Term Goals   Additional Long Term Goals  Yes      PT LONG TERM GOAL #6   Title  Patient will improve 5x sit <> stand to </= 20 seconds to demonstrate improved BLE strength and reduce risk for falls.    Baseline  36.06    Time  6    Period  Weeks    Status  New    Target Date  04/21/20             Plan - 03/10/20 1417    Clinical Impression Statement  Pt is a 69 y/o male presenting to [facility] neuro for L sided weakness s/p R thalamic hemorrhage on 02/22/20. Patient is familiar to this clinic, as he had prior therapy after prior nontraumatic ICH that occurred in 2019. Pt's PMH  is significant for the following: Chronic Back Pain, Barrett's Esophagus, GERD, Hyperglycemia, HTN, CVA, Hyperlipidemia, and vision abnormalities. With assessment of Berg Balance, TUG, and 5x sit to stand test patient is at increased risk for falls.  Patient is currently ambulating with a RW at this time but demonstrates decreased safety awareness with AD. PT providing verbal cues and education on proper use of RW with transfers to improve safety within the home. The following impairments were noted upon exam: gait deviations, impaired balance, decreased RLE strength, decreased coordination and postural control, and decreased safety awareness. Pt would benefit from skilled PT to improve deficits listed above, improve overall functional mobility and reduce risk for falls.    Personal Factors and Comorbidities  Comorbidity 3+    Comorbidities  Chronic Back Pain, Barrett's Esophagus,  GERD, Hyperglycemia, HTN, CVA, Hyperlipidemia, vision abnormalities, ICH in the right thalamus/posterior limb of internal capsule    Examination-Activity Limitations  Bed Mobility;Transfers;Stairs;Stand    Examination-Participation Restrictions  Yard Work;Community Activity    Stability/Clinical Decision Making  Evolving/Moderate complexity    Clinical Decision Making  Moderate    Rehab Potential  Good    PT Frequency  2x / week    PT Duration  6 weeks    PT Treatment/Interventions  ADLs/Self Care Home Management;Electrical Stimulation;Moist Heat;Cryotherapy;Gait training;Stair training;Functional mobility training;Therapeutic activities;DME Instruction;Therapeutic exercise;Balance training;Neuromuscular re-education;Patient/family education;Orthotic Fit/Training;Passive range of motion;Visual/perceptual remediation/compensation    PT Next Visit Plan  Initiate HEP, Gait and Transfer Training, Assess Stairs    Consulted and Agree with Plan of Care  Patient;Family member/caregiver    Family Member Consulted  Wife       Patient will benefit from skilled therapeutic intervention in order to improve the following deficits and impairments:  Abnormal gait, Decreased balance, Decreased endurance, Decreased mobility, Difficulty walking, Impaired vision/preception, Decreased knowledge of precautions, Decreased strength, Decreased safety awareness, Decreased knowledge of use of DME, Decreased activity tolerance  Visit Diagnosis: Muscle weakness (generalized)  Nontraumatic hemorrhage of right cerebral hemisphere (HCC)  Unsteadiness on feet  Other abnormalities of gait and mobility     Problem List Patient Active Problem List   Diagnosis Date Noted  . Depressive reaction   . Sleep disturbance   . Urinary retention   . Bradycardia   . Essential hypertension   . Temperature elevated   . Thyroid nodule   . Treatment-emergent central sleep apnea 10/25/2018  . 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) 10/25/2018  . Chronic pain syndrome   . Chronic bilateral low back pain   . Benign essential HTN   . Tobacco abuse   . Marijuana abuse   . Hyperlipidemia   . History of CVA with residual deficit   . Dysphagia, post-stroke   . ICH (intracerebral hemorrhage) (HCC) - R thalmic/PLIC d/t HTN [id number]  . Insomnia 07/13/2017  . PAD (peripheral artery disease) ([place]) 07/13/2017  . Hyperlipidemia with target LDL less than 70 07/13/2017  . Stroke-like symptom 09/22/2016  . Paresthesia 09/22/2016  . CVA (cerebral vascular accident) ([place]) 09/22/2016  . Cerebrovascular accident (CVA) due to thrombosis of left carotid artery ([place]) 09/23/2015  . Primary snoring 09/23/2015  . Hypersomnia with sleep apnea 09/23/2015  . Obstructive sleep apnea 09/23/2015  . Lacunar infarct, acute ([place]) 08/25/2015  . Sleep apnea 08/25/2015  . Dysarthria   . CVD (cardiovascular disease) 08/02/2015  . Acute hyperglycemia 08/02/2015  . Systolic hypertension with cerebrovascular disease 12/27/2014  . Epistaxis 07/28/2012  . HYPERGLYCEMIA 10/02/2010  . NEPHROLITHIASIS 05/23/2009  .  BARRETTS ESOPHAGUS 02/20/2009  . Gastroparesis 02/20/2009  . RADICULOPATHY 10/21/2008  . GERD 10/08/2008  . ESOPHAGITIS 08/15/2008  . ESOPHAGEAL STRICTURE 08/15/2008  . GASTRITIS, ACUTE 08/15/2008  . RENAL CYST 08/14/2008  . TOBACCO ABUSE 08/08/2008  . HEMATURIA, MICROSCOPIC, HX OF 08/08/2008  . PULMONARY NODULE 01/10/2008    Jones Bales, PT, DPT 03/10/2020, 4:45 PM  [facility] [address] [place], [place], [address] Phone: [phone]   Fax:  [phone]  Name: KAILASH GLASSBURN MRN: [id number] Date of Birth: 1951/01/07

## 2020-03-11 NOTE — Telephone Encounter (Signed)
No Naproxen. Thanks, BJ

## 2020-03-12 ENCOUNTER — Ambulatory Visit: Payer: Medicare Other

## 2020-03-12 ENCOUNTER — Other Ambulatory Visit: Payer: Self-pay

## 2020-03-12 DIAGNOSIS — R471 Dysarthria and anarthria: Secondary | ICD-10-CM

## 2020-03-12 DIAGNOSIS — I612 Nontraumatic intracerebral hemorrhage in hemisphere, unspecified: Secondary | ICD-10-CM | POA: Diagnosis not present

## 2020-03-12 DIAGNOSIS — M6281 Muscle weakness (generalized): Secondary | ICD-10-CM

## 2020-03-12 DIAGNOSIS — R41841 Cognitive communication deficit: Secondary | ICD-10-CM | POA: Diagnosis not present

## 2020-03-12 DIAGNOSIS — R2681 Unsteadiness on feet: Secondary | ICD-10-CM | POA: Diagnosis not present

## 2020-03-12 DIAGNOSIS — R2689 Other abnormalities of gait and mobility: Secondary | ICD-10-CM | POA: Diagnosis not present

## 2020-03-12 NOTE — Therapy (Signed)
[facility] [address] [place], [place], [address] Phone: [phone]   Fax:  [phone]  Speech Language Pathology Evaluation  Patient Details  Name: Richard Vaughn MRN: [id number] Date of Birth: 03-22-1951 Referring Provider (SLP): Reesa Chew (referral), [place], Betty, MD (documentation)   Encounter Date: 03/12/2020  End of Session - 03/12/20 2316    Visit Number  1    Number of Visits  17    Date for SLP Re-Evaluation  06/10/20   90 days   SLP Start Time  1150    SLP Stop Time   1230    SLP Time Calculation (min)  40 min    Activity Tolerance  Patient tolerated treatment well       Past Medical History:  Diagnosis Date  . Barrett's esophagus   . Chronic back pain   . Gastritis    Mild  . Gastroparesis   . GERD (gastroesophageal reflux disease)   . Headache   . Hyperglycemia   . Hypertension   . Nephrolithiasis   . Renal cyst   . Stricture and stenosis of esophagus   . Stroke ([facility]) 07-2015  . Vision abnormalities     Past Surgical History:  Procedure Laterality Date  . [place] SURGERY  2012  . COLONOSCOPY    . [place] SURGERY  2005, 2010   replaced L4 and L5  . UPPER GASTROINTESTINAL ENDOSCOPY      There were no vitals filed for this visit.  Subjective Assessment - 03/12/20 1751    Subjective  "Did COVID affect you guys at all?"    Currently in Pain?  No/denies         SLP Evaluation [facility] - 03/12/20 1751      SLP Visit Information   SLP Received On  03/12/20    Referring Provider (SLP)  Reesa Chew (referral), [place], Betty, MD (documentation)    Onset Date  02-22-20    Medical Diagnosis  rt thalamic CVA      General Information   HPI  Richard Vaughn is a 69 y.o. male with PMHx HTN, GERD, previous stroke 2019 who copmleted a previous ST course at this clinic for dysarthria. He presented to the ED via EMS with complaint of slurred speech and left sided weakness that  began at approximately 7:45 PM 02-22-20.  Pt with hx of of both ischemic stroke and ICHs.  Head CT reported a right thalamic hemorrhage with mild surrounding edema and minimal mass effect. He completed  course of PT, OT, adn ST on [name] and was discharged to home 03-07-20      Prior Functional Status   Cognitive/Linguistic Baseline  Within functional limits    Type of Home  House     Lives With  Spouse    Available Support  Family    Education  some college    Vocation  On disability      Cognition   Overall Cognitive Status  Impaired/Different from baseline    Area of Impairment  Attention;Awareness    Current Attention Level  Sustained    Attention Comments  Pt looking out doorway into hall with minor distraction. Pt did not hold attention long enough to comprehend line cross-out task nor copy 3D image.    Awareness  Intellectual;Emergent    Awareness Comments  Stated focus was worse than pre-CVA. Pt did not correct nor indicate awareness of mistakes on test items today.  Memory  --   remembered to write "I am done" at end of test, with min cue     Auditory Comprehension   Overall Auditory Comprehension  Appears within functional limits for tasks assessed      Verbal Expression   Overall Verbal Expression  Appears within functional limits for tasks assessed      Oral Motor/Sensory Function   Overall Oral Motor/Sensory Function  Impaired    Labial ROM  Within Functional Limits    Labial Symmetry  Within Functional Limits    Labial Strength  Reduced    Labial Coordination  Reduced    Lingual ROM  Reduced left   slight   Lingual Symmetry  Within Functional Limits    Lingual Strength  Reduced   reduced more lt side than rt   Lingual Coordination  Reduced      Motor Speech   Overall Motor Speech  Impaired at baseline    Respiration  Impaired    Level of Impairment  Phrase    Phonation  Low vocal intensity   mid 60s dB in simple conversation   Intelligibility  Intelligibility  reduced    Sentence  75-100% accurate    Conversation  75-100% accurate    Motor Planning  Witnin functional limits    Effective Techniques  Slow rate;Increased vocal intensity;Over-articulate   "open your mouth and slow down" reading- improved intelligib     Standardized Assessments   Standardized Assessments   Self-Administered Gero-Cognitive Examination    Self-Administered Gero-Cognitive Examination   10/22 (below 3 standard deviations below the mean)                      SLP Education - 03/12/20 2315    Education Details  eval results, possible goals    Person(s) Educated  Patient    Methods  Explanation    Comprehension  Verbalized understanding       SLP Short Term Goals - 03/12/20 2323      SLP SHORT TERM GOAL #1   Title  pt will improve emergent awareness with simple functional tasks to requiring min A rarely over three sessions    Time  4    Period  Weeks   or 9 initital sessions for all STGs   Status  New      SLP SHORT TERM GOAL #2   Title  pt will demo compensations for dysarthria in simple sentence responses generating at lest 90% intelligibility over 3 sessions    Time  4    Period  Weeks    Status  New      SLP SHORT TERM GOAL #3   Title  pt will demonstrate sustained attention for 15 minutes in mod complex tasks in 3 sessions    Time  4    Period  Weeks    Status  New       SLP Long Term Goals - 03/12/20 2327      SLP LONG TERM GOAL #1   Title  pt will demo selective attention to complete simple-mod complex functional tasks in mod noisy environment for 10 minutes    Time  8    Period  Weeks   or 17 total sessions for all LTGs   Status  New      SLP LONG TERM GOAL #2   Title  pt will demo anticipatory awareness with mod complex tasks by double checkng work 100% of the time over three  sessions    Time  8    Period  Weeks    Status  New      SLP LONG TERM GOAL #3   Title  pt will demo speech volume in 10 minutes simple-mod  complex conversation, in low 70s dB average over three consecutive sessions    Time  8    Period  Weeks    Status  New      SLP LONG TERM GOAL #4   Title  pt will produce 10 minutes intelligible simple-mod complex conversation using compensations, with rare min A over 3 consecutive sessions    Time  8    Period  Weeks    Status  New       Plan - 03/12/20 2317    Clinical Impression Statement  Pt with some mild premorbid dysarthria. He presents today with mild-mod cognitive communication deficits characterized by decr'd attention and awareness, as well as significant dysarthria decreasing pt's speech intelligbility at the sentence and conversation levels.Pt would benefit from skilled ST targeting cognitive linguistics as well as dysarthria. Pt was stimulable for use of compensatory strategies for speech intelligbility.    Speech Therapy Frequency  2x / week    Duration  --   8 weeks or 17 total sessions   Treatment/Interventions  Oral motor exercises;Compensatory techniques;Functional tasks;SLP instruction and feedback;Patient/family education;Internal/external aids;Cueing hierarchy;Cognitive reorganization    Potential to Achieve Goals  Good    Potential Considerations  Severity of impairments       Patient will benefit from skilled therapeutic intervention in order to improve the following deficits and impairments:   Dysarthria and anarthria  Cognitive communication deficit    Problem List Patient Active Problem List   Diagnosis Date Noted  . Depressive reaction   . Sleep disturbance   . Urinary retention   . Bradycardia   . Essential hypertension   . Temperature elevated   . Left thyroid nodule   . Treatment-emergent central sleep apnea 10/25/2018  . 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 Chronic pain syndrome   . Chronic bilateral low back pain   . Benign essential HTN   . Tobacco abuse   . Marijuana abuse   . Hyperlipidemia    . History of CVA with residual deficit   . Dysphagia, post-stroke   . ICH (intracerebral hemorrhage) (HCC) - R thalmic/PLIC d/t HTN 123456  . Insomnia 07/13/2017  . PAD (peripheral artery disease) ([facility]) 07/13/2017  . Hyperlipidemia with target LDL less than 70 07/13/2017  . Stroke-like symptom 09/22/2016  . Paresthesia 09/22/2016  . CVA (cerebral vascular accident) ([facility]) 09/22/2016  . Cerebrovascular accident (CVA) due to thrombosis of left carotid artery ([facility]) 09/23/2015  . Primary snoring 09/23/2015  . Hypersomnia with sleep apnea 09/23/2015  . Obstructive sleep apnea 09/23/2015  . Lacunar infarct, acute ([facility]) 08/25/2015  . Sleep apnea 08/25/2015  . Dysarthria   . CVD (cardiovascular disease) 08/02/2015  . Acute hyperglycemia 08/02/2015  . Systolic hypertension with cerebrovascular disease 12/27/2014  . Epistaxis 07/28/2012  . HYPERGLYCEMIA 10/02/2010  . NEPHROLITHIASIS 05/23/2009  . BARRETTS ESOPHAGUS 02/20/2009  . Gastroparesis 02/20/2009  . RADICULOPATHY 10/21/2008  . GERD 10/08/2008  . ESOPHAGITIS 08/15/2008  . ESOPHAGEAL STRICTURE 08/15/2008  . GASTRITIS, ACUTE 08/15/2008  . RENAL CYST 08/14/2008  . TOBACCO ABUSE 08/08/2008  . HEMATURIA, MICROSCOPIC, HX OF 08/08/2008  . PULMONARY NODULE 01/10/2008    [place] ,[place], CCC-SLP  03/12/2020, 11:31 PM  [facility]  [facility] [address] [place], [place], [address] Phone: [phone]   Fax:  [phone]  Name: Richard Vaughn MRN: [id number] Date of Birth: [birth year]

## 2020-03-12 NOTE — Therapy (Signed)
[facility] [address] [place], [place], [address] Phone: [phone]   Fax:  [phone]  Physical Therapy Treatment  Patient Details  Name: Richard Vaughn MRN: [id number] Date of Birth: 1951/08/01 Referring Provider (PT): Reesa Chew PA-C   Encounter Date: 03/12/2020  PT End of Session - 03/12/20 1242    Visit Number  2    Number of Visits  13    Date for PT Re-Evaluation  06/08/20   POC for 6 weeks, Cert for 90 days   Authorization Type  Medicare + [employer] (10th visit PN)    Progress Note Due on Visit  10    PT Start Time  1240   PT running behind   PT Stop Time  1318    PT Time Calculation (min)  38 min    Equipment Utilized During Treatment  Gait belt    Activity Tolerance  Patient tolerated treatment well    Behavior During Therapy  WFL for tasks assessed/performed       Past Medical History:  Diagnosis Date  . Barrett's esophagus   . Chronic back pain   . Gastritis    Mild  . Gastroparesis   . GERD (gastroesophageal reflux disease)   . Headache   . Hyperglycemia   . Hypertension   . Nephrolithiasis   . Renal cyst   . Stricture and stenosis of esophagus   . Stroke ([facility]) 07-2015  . Vision abnormalities     Past Surgical History:  Procedure Laterality Date  . [place] SURGERY  2012  . COLONOSCOPY    . [place] SURGERY  2005, 2010   replaced L4 and L5  . UPPER GASTROINTESTINAL ENDOSCOPY      There were no vitals filed for this visit.  Subjective Assessment - 03/12/20 1242    Subjective  Pt reports doing ok.    Patient is accompained by:  Family member   Wife   Pertinent History  Chronic Back Pain, Barrett's Esophagus, GERD, Hyperglycemia, HTN, CVA, Hyperlipidemia, vision abnormalities, ICH in the right thalamus/posterior limb of internal capsule    Limitations  Standing;Walking;House hold activities    Patient Stated Goals  "want to walk", "get back to how I was"    Currently in  Pain?  No/denies                       [facility] Adult PT Treatment/Exercise - 03/12/20 1243      Transfers   Transfers  Sit to Stand;Stand to Sit    Sit to Stand  5: Supervision    Comments  Pt performed sit to stand 5 x 2 from mat with UE support on ascent. Verbal cues to lean forward. 2 LOB posterior when did not come forward enough.      Ambulation/Gait   Ambulation/Gait  Yes    Ambulation/Gait Assistance  5: Supervision;4: Min guard    Ambulation/Gait Assistance Details  Pt was cued to try to increase left foot clearance and heel strike    Ambulation Distance (Feet)  230 Feet   115' x1   Assistive device  Rolling walker    Gait Pattern  Step-through pattern;Decreased hip/knee flexion - left    Ambulation Surface  Level;Indoor    Stairs  Yes    Stairs Assistance  4: Min guard    Stair Management Technique  Two rails;Step to pattern    Number of Stairs  4  Neuro Re-ed    Neuro Re-ed Details   Alternating toe taps on cone with 1 UE support, standing on airex x 30 sec eyes open and x 30 sec eyes closed.      Exercises   Exercises  Other Exercises    Other Exercises   Pt performed along counter: raising up on toes and back on heels x 10, side stepping 8' x 4, marching gait with cues to control steps 8' x 6.             PT Education - 03/12/20 1734    Education Details  Started on initial strengthening HEP. Pt instructed to use RW at all times    Person(s) Educated  Patient;Spouse    Methods  Explanation;Demonstration;Handout    Comprehension  Verbalized understanding;Returned demonstration       PT Short Term Goals - 03/10/20 1430      PT SHORT TERM GOAL #1   Title  Patient will be indepdent with initial HEP    Baseline  HEP not yet initiated    Time  3    Period  Weeks    Status  New    Target Date  03/31/20      PT SHORT TERM GOAL #2   Title  Patient will improve 5x sit <> stand to < 30 seconds w/ UE use to demonstrate improved BLE strength     Baseline  36.06 secs w/ UE    Time  3    Period  Weeks    Status  New    Target Date  03/31/20      PT SHORT TERM GOAL #3   Title  Patient will improve TUG to <32 seconds for improved functional mobility    Baseline  37.44 sec    Time  3    Period  Weeks    Status  New    Target Date  03/31/20        PT Long Term Goals - 03/10/20 1435      PT LONG TERM GOAL #1   Title  Patient will be independent with final HEP    Time  6    Period  Weeks    Status  New    Target Date  04/21/20      PT LONG TERM GOAL #2   Title  Patient will improve TUG time to </= 25 secs with LRAD to reduce risk for falls    Baseline  37.44    Time  6    Period  Weeks    Status  New    Target Date  04/21/20      PT LONG TERM GOAL #3   Title  Pt will improve gait speed to >/=2.0 ft/sec. with LRAD to safely amb. in the community.    Baseline  1.36 ft    Time  6    Period  Weeks    Status  New    Target Date  04/21/20      PT LONG TERM GOAL #4   Title  Pt will improve BERG score to >/=45/56 to decr. falls risk.     Baseline  36/56    Time  6    Period  Weeks    Status  New    Target Date  04/21/20      PT LONG TERM GOAL #5   Title  Pt will amb. 600' over level surfaces with LRAD at MOD I level to demonstrate  improved functional mobility.    Time  6    Period  Weeks    Status  New    Target Date  04/21/20      Additional Long Term Goals   Additional Long Term Goals  Yes      PT LONG TERM GOAL #6   Title  Patient will improve 5x sit <> stand to </= 20 seconds to demonstrate improved BLE strength and reduce risk for falls.    Baseline  36.06    Time  6    Period  Weeks    Status  New    Target Date  04/21/20            Plan - 03/12/20 1735    Clinical Impression Statement  Pt was able to ambulate with RW supervision at visit today. PT did advise to use RW at all times due to fall risk. Session focused on initiating strengthening HEP.    Personal Factors and Comorbidities   Comorbidity 3+    Comorbidities  Chronic Back Pain, Barrett's Esophagus, GERD, Hyperglycemia, HTN, CVA, Hyperlipidemia, vision abnormalities, ICH in the right thalamus/posterior limb of internal capsule    Examination-Activity Limitations  Bed Mobility;Transfers;Stairs;Stand    Examination-Participation Restrictions  Yard Work;Community Activity    Stability/Clinical Decision Making  Evolving/Moderate complexity    Rehab Potential  Good    PT Frequency  2x / week    PT Duration  6 weeks    PT Treatment/Interventions  ADLs/Self Care Home Management;Electrical Stimulation;Moist Heat;Cryotherapy;Gait training;Stair training;Functional mobility training;Therapeutic activities;DME Instruction;Therapeutic exercise;Balance training;Neuromuscular re-education;Patient/family education;Orthotic Fit/Training;Passive range of motion;Visual/perceptual remediation/compensation    PT Next Visit Plan  How are initial exercises going?  Patent examiner.    Consulted and Agree with Plan of Care  Patient;Family member/caregiver    Family Member Consulted  Wife       Patient will benefit from skilled therapeutic intervention in order to improve the following deficits and impairments:  Abnormal gait, Decreased balance, Decreased endurance, Decreased mobility, Difficulty walking, Impaired vision/preception, Decreased knowledge of precautions, Decreased strength, Decreased safety awareness, Decreased knowledge of use of DME, Decreased activity tolerance  Visit Diagnosis: Other abnormalities of gait and mobility  Muscle weakness (generalized)     Problem List Patient Active Problem List   Diagnosis Date Noted  . Depressive reaction   . Sleep disturbance   . Urinary retention   . Bradycardia   . Essential hypertension   . Temperature elevated   . Left thyroid nodule   . Treatment-emergent central sleep apnea 10/25/2018  . 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) 10/25/2018   . Chronic pain syndrome   . Chronic bilateral low back pain   . Benign essential HTN   . Tobacco abuse   . Marijuana abuse   . Hyperlipidemia   . History of CVA with residual deficit   . Dysphagia, post-stroke   . ICH (intracerebral hemorrhage) (HCC) - R thalmic/PLIC d/t HTN 123456  . Insomnia 07/13/2017  . PAD (peripheral artery disease) ([facility]) 07/13/2017  . Hyperlipidemia with target LDL less than 70 07/13/2017  . Stroke-like symptom 09/22/2016  . Paresthesia 09/22/2016  . CVA (cerebral vascular accident) ([facility]) 09/22/2016  . Cerebrovascular accident (CVA) due to thrombosis of left carotid artery ([facility]) 09/23/2015  . Primary snoring 09/23/2015  . Hypersomnia with sleep apnea 09/23/2015  . Obstructive sleep apnea 09/23/2015  . Lacunar infarct, acute ([facility]) 08/25/2015  . Sleep apnea 08/25/2015  . Dysarthria   .  CVD (cardiovascular disease) 08/02/2015  . Acute hyperglycemia 08/02/2015  . Systolic hypertension with cerebrovascular disease 12/27/2014  . Epistaxis 07/28/2012  . HYPERGLYCEMIA 10/02/2010  . NEPHROLITHIASIS 05/23/2009  . BARRETTS ESOPHAGUS 02/20/2009  . Gastroparesis 02/20/2009  . RADICULOPATHY 10/21/2008  . GERD 10/08/2008  . ESOPHAGITIS 08/15/2008  . ESOPHAGEAL STRICTURE 08/15/2008  . GASTRITIS, ACUTE 08/15/2008  . RENAL CYST 08/14/2008  . TOBACCO ABUSE 08/08/2008  . HEMATURIA, MICROSCOPIC, HX OF 08/08/2008  . PULMONARY NODULE 01/10/2008    Electa Sniff, PT, DPT, NCS 03/12/2020, 5:37 PM  [facility] [address] [place], [place], [address] Phone: [phone]   Fax:  [phone]  Name: UEL FRUIT MRN: [id number] Date of Birth: 02-22-1951

## 2020-03-12 NOTE — Patient Instructions (Signed)
Access Code: [email or web address] URL: [email or web address] Date: 03/12/2020 Prepared by: Cherly Anderson  Exercises Sit to Stand - 1 x daily - 7 x weekly - 2 sets - 5 reps Heel Toe Raises with Counter Support - 1 x daily - 5 x weekly - 2 sets - 10 reps Heel Toe Raises with Unilateral Counter Support - 1 x daily - 5 x weekly - 2 sets - 10 reps Side Stepping with Counter Support - 1 x daily - 5 x weekly - 2 sets - 10 reps

## 2020-03-13 NOTE — Telephone Encounter (Signed)
Attempted to contact patient. No answer. LVM for patient to return call.  

## 2020-03-14 ENCOUNTER — Ambulatory Visit
Admission: RE | Admit: 2020-03-14 | Discharge: 2020-03-14 | Disposition: A | Discharge status: Home / Self Care | Payer: Federal, State, Local not specified - PPO | Source: Ambulatory Visit | Attending: Family Medicine | Admitting: Family Medicine

## 2020-03-14 ENCOUNTER — Other Ambulatory Visit: Payer: Self-pay

## 2020-03-14 DIAGNOSIS — M25512 Pain in left shoulder: Secondary | ICD-10-CM

## 2020-03-14 DIAGNOSIS — S46012A Strain of muscle(s) and tendon(s) of the rotator cuff of left shoulder, initial encounter: Secondary | ICD-10-CM | POA: Diagnosis not present

## 2020-03-14 DIAGNOSIS — M67912 Unspecified disorder of synovium and tendon, left shoulder: Secondary | ICD-10-CM

## 2020-03-17 ENCOUNTER — Other Ambulatory Visit: Payer: Self-pay

## 2020-03-17 ENCOUNTER — Ambulatory Visit (INDEPENDENT_AMBULATORY_CARE_PROVIDER_SITE_OTHER): Payer: Medicare Other | Admitting: Family Medicine

## 2020-03-17 VITALS — BP 132/76 | HR 63 | Temp 98.0°F | Resp 16 | Ht 74.0 in | Wt 245.0 lb

## 2020-03-17 DIAGNOSIS — I739 Peripheral vascular disease, unspecified: Secondary | ICD-10-CM | POA: Diagnosis not present

## 2020-03-17 DIAGNOSIS — G4700 Insomnia, unspecified: Secondary | ICD-10-CM | POA: Diagnosis not present

## 2020-03-17 DIAGNOSIS — I10 Essential (primary) hypertension: Secondary | ICD-10-CM

## 2020-03-17 DIAGNOSIS — I2510 Atherosclerotic heart disease of native coronary artery without angina pectoris: Secondary | ICD-10-CM

## 2020-03-17 DIAGNOSIS — E041 Nontoxic single thyroid nodule: Secondary | ICD-10-CM

## 2020-03-17 NOTE — Progress Notes (Signed)
HPI:  [name] is a 69 y.o. male with hx of CVA,chronic pain,HTN,and HLD who is here today with his wife to follow on recent hospitalization.  Admitted on 02/27/2020. Discharged to inpt rehab on 02/27/20. Discharged home on 03/07/2020 Dx intracerebral hemorrhage.  [facility] call 03/10/20.  He presented to the ER on 02/22/20 with headache, visual changes, left hemiparesis, and slurred speech. Head CT showed acute right thalamic hemorrhagic with mild surrounding edema and mass effect. Head and neck CTA : 1. Stable arterial findings since a 2017 CTA: No large vessel occlusion. Minimal atherosclerosis in the head and neck - although there is at least moderate stenosis suspected at the origin of the non-dominant left vertebral artery origin. 2. No CTA spot sign associated with the right thalamic hemorrhage. 3. 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 This appears not significantly changed since a 2016 brain MRI.  He is not on statin.  Lab Results  Component Value Date   CHOL 125 02/22/2020   HDL 35 (L) 02/22/2020   LDLCALC 73 02/22/2020   TRIG 86 02/22/2020   CHOLHDL 3.6 02/22/2020   Residual slurred speech, LLE weakness and ataxia. PT 2-3 times per week. He is using a walker for transfer, he was using a wheel chair when admitted to inpt rehab.  He feels like he is slowly getting better.  He had "mild" right occipital-temporal dull headache. Negative for visual changes, dysphagia,or new neurologic focal deficit.  HTN: During hospitalization he developed hypotension,so Amlodipine and Avapro were discontinued. Negative for SOB,CP,palpitations,or edema.  Lab Results  Component Value Date   CREATININE 1.14 03/03/2020   BUN 14 03/03/2020   NA 139 03/03/2020   K 3.8 03/03/2020   CL 104 03/03/2020    CO2 26 03/03/2020    Home BP readings < 140/90.  Flomax 0.4 mg was added during hospitalization due to issues with urinary retention.His wife did not think he need this medication, so discontinued. He has not had problem with starting urination or urgency. Nocturia x 1.  Insomnia: Trazodone was titrated up to 150 mg at bedtime, he did not feel like it helped more than 50 mg. He decreased back to 50 mg. He just got Melatonin to try. Anxiety seems to aggravate problem.  Review of Systems  Constitutional: Positive for activity change and fatigue. Negative for appetite change and fever.  HENT: Negative for mouth sores, nosebleeds and sore throat.   Eyes: Negative for redness and visual disturbance.  Respiratory: Negative for cough and wheezing.   Gastrointestinal: Negative for abdominal pain, nausea and vomiting.  Genitourinary: Negative for decreased urine volume, dysuria and hematuria.  Musculoskeletal: Positive for gait problem.  Neurological: Negative for seizures and syncope.  Psychiatric/Behavioral: Negative for confusion. The patient is nervous/anxious.   Rest see pertinent positives and negatives per HPI.  Current Outpatient Medications on File Prior to Visit  Medication Sig Dispense Refill  . amantadine (SYMMETREL) 100 MG capsule Take 2 capsules (200 mg total) by mouth daily. 30 capsule 0  . esomeprazole (NEXIUM) 40 MG capsule Take 1 capsule (40 mg total) by mouth 2 (two) times daily before a meal. Please schedule a yearly follow up for any further refills.  Last seen 01-2019. (Patient taking differently: Take 40 mg by mouth 2 (two) times  daily before a meal. ) 60 capsule 0  . melatonin 300 MCG TABS Take 5 tablets (1,500 mcg total) by mouth at bedtime.  0  . oxymorphone (OPANA) 10 MG tablet Take 10 mg by mouth in the morning and at bedtime.     . traZODone (DESYREL) 150 MG tablet Take 1 tablet (150 mg total) by mouth at bedtime. 30 tablet 0  . senna-docusate (SENOKOT-S) 8.6-50  MG tablet Take 1 tablet by mouth 2 (two) times daily. (Patient not taking: Reported on 03/17/2020)     No current facility-administered medications on file prior to visit.   Past Medical History:  Diagnosis Date  . Barrett's esophagus   . Chronic back pain   . Gastritis    Mild  . Gastroparesis   . GERD (gastroesophageal reflux disease)   . Headache   . Hyperglycemia   . Hypertension   . Nephrolithiasis   . Renal cyst   . Stricture and stenosis of esophagus   . Stroke ([place]) 07-2015  . Vision abnormalities    No Known Allergies  Social History   Socioeconomic History  . Marital status: Married    Spouse name: Donat Gamlin  . Number of children: 3  . Years of education: Not on file  . Highest education level: Not on file  Occupational History  . Occupation: retired    Fish farm manager: [employer]  Tobacco Use  . Smoking status: Former Smoker    Packs/day: 0.50    Years: 30.00    Pack years: 15.00    Types: Cigarettes    Quit date: 04/15/2018    Years since quitting: 1.9  . Smokeless tobacco: Never Used  Substance and Sexual Activity  . Alcohol use: No    Alcohol/week: 0.0 standard drinks  . Drug use: Never    Comment: last use 04/2018  . Sexual activity: Yes  Other Topics Concern  . Not on file  Social History Narrative  . Not on file   Social Determinants of Health   Financial Resource Strain:   . Difficulty of Paying Living Expenses:   Food Insecurity:   . Worried About Charity fundraiser in the Last Year:   . Arboriculturist in the Last Year:   Transportation Needs:   . Film/video editor (Medical):   Marland Kitchen Lack of Transportation (Non-Medical):   Physical Activity:   . Days of Exercise per Week:   . Minutes of Exercise per Session:   Stress:   . Feeling of Stress :   Social Connections:   . Frequency of Communication with Friends and Family:   . Frequency of Social Gatherings with Friends and Family:   . Attends Religious Services:   . Active Member of  Clubs or Organizations:   . Attends Archivist Meetings:   Marland Kitchen Marital Status:     Vitals:   03/17/20 1123  BP: 132/76  Pulse: 63  Resp: 16  Temp: 98 F (36.7 C)  SpO2: 97%   Body mass index is 31.46 kg/m.  Physical Exam  Nursing note and vitals reviewed. Constitutional: He is oriented to person, place, and time. He appears well-developed. No distress.  HENT:  Head: Normocephalic and atraumatic.  Mouth/Throat: Oropharynx is clear and moist and mucous membranes are normal.  Eyes: Pupils are equal, round, and reactive to light. Conjunctivae are normal.  Cardiovascular: Normal rate and regular rhythm.  No murmur heard. Pulses:      Dorsalis pedis pulses are  2+ on the right side and 2+ on the left side.  Respiratory: Effort normal and breath sounds normal. No respiratory distress.  GI: Soft. He exhibits no mass. There is no abdominal tenderness.  Musculoskeletal:        General: Edema (Trace pitting LE edema,bilateral.) present.  Lymphadenopathy:    He has no cervical adenopathy.  Neurological: He is alert and oriented to person, place, and time. He has normal strength. Gait (Assisted with a walker.) abnormal.  No significant weakness appreciated.  Skin: Skin is warm. No rash noted. No erythema.  Psychiatric: He has a normal mood and affect. His speech is slurred. Cognition and memory are normal.  Well groomed, good eye contact.    ASSESSMENT AND PLAN:  Mr. Gorje was seen today for tcm and hospitalization follow-up.  Diagnoses and all orders for this visit:  Insomnia, unspecified type Continue Trazodone 50 mg at bedtime. Add melatonin 2-3 hours before bedtime. Good sleep hygiene.  Essential hypertension BP adequately controlled. Continue non pharmacologic treatment. Monitor BP regularly. Continue low salt diet.  CVD (cardiovascular disease) Past hx of hemorrhagic CVA. Strongly recommend avoiding tobacco use. NO NSAID's. He has refused statins in the  past. He was instructed to arrange appt with neuro,information given.  Thyroid nodule Will plan on checking TSH with next blood work. Further recommendations according to imaging results.  -     US THYROID; Future  PAD (peripheral artery disease) (HCC) Antiplatelet agents contraindicated. Not on statin.   Return in about 3 months (around 06/17/2020).    G. Martin[place], MD  [facility] [place] office.   A few things to remember from today's visit:   No changes today. We will arrange thyroid ultrasound. I will see you back in 3 months. Please arrange appt wit neuro.  If you need refills please call your pharmacy. Do not use My Chart to request refills or for acute issues that need immediate attention.     [facility] Follow up.   Why: Call next week if you have not heard from them--stroke follow up Contact information: [address]     [address] [place] [phone]   Please be sure medication list is accurate. If a new problem present, please set up appointment sooner than planned today.

## 2020-03-17 NOTE — Patient Instructions (Addendum)
A few things to remember from today's visit:   No changes today. We will arrange thyroid ultrasound. I will see you back in 3 months. Please arrange appt wit neuro.  If you need refills please call your pharmacy. Do not use My Chart to request refills or for acute issues that need immediate attention.     [facility] Follow up.   Why: Call next week if you have not heard from them--stroke follow up Contact information: [address]     [address] [place] [phone]   Please be sure medication list is accurate. If a new problem present, please set up appointment sooner than planned today.

## 2020-03-18 ENCOUNTER — Ambulatory Visit: Payer: Medicare Other | Attending: Family Medicine

## 2020-03-18 ENCOUNTER — Encounter: Payer: Self-pay | Admitting: Family Medicine

## 2020-03-18 DIAGNOSIS — R278 Other lack of coordination: Secondary | ICD-10-CM | POA: Insufficient documentation

## 2020-03-18 DIAGNOSIS — I69354 Hemiplegia and hemiparesis following cerebral infarction affecting left non-dominant side: Secondary | ICD-10-CM | POA: Insufficient documentation

## 2020-03-18 DIAGNOSIS — R2689 Other abnormalities of gait and mobility: Secondary | ICD-10-CM | POA: Diagnosis not present

## 2020-03-18 DIAGNOSIS — M25512 Pain in left shoulder: Secondary | ICD-10-CM | POA: Insufficient documentation

## 2020-03-18 DIAGNOSIS — R293 Abnormal posture: Secondary | ICD-10-CM | POA: Diagnosis not present

## 2020-03-18 DIAGNOSIS — M6281 Muscle weakness (generalized): Secondary | ICD-10-CM | POA: Diagnosis not present

## 2020-03-18 DIAGNOSIS — R41842 Visuospatial deficit: Secondary | ICD-10-CM | POA: Diagnosis not present

## 2020-03-18 DIAGNOSIS — G8929 Other chronic pain: Secondary | ICD-10-CM | POA: Insufficient documentation

## 2020-03-18 DIAGNOSIS — R471 Dysarthria and anarthria: Secondary | ICD-10-CM | POA: Insufficient documentation

## 2020-03-18 DIAGNOSIS — I612 Nontraumatic intracerebral hemorrhage in hemisphere, unspecified: Secondary | ICD-10-CM | POA: Diagnosis not present

## 2020-03-18 DIAGNOSIS — R41841 Cognitive communication deficit: Secondary | ICD-10-CM | POA: Diagnosis not present

## 2020-03-18 DIAGNOSIS — R270 Ataxia, unspecified: Secondary | ICD-10-CM | POA: Diagnosis not present

## 2020-03-18 DIAGNOSIS — R2681 Unsteadiness on feet: Secondary | ICD-10-CM | POA: Insufficient documentation

## 2020-03-18 NOTE — Therapy (Signed)
[facility] [address] [place], [place], [address] Phone: [phone]   Fax:  [phone]  Physical Therapy Treatment  Patient Details  Name: Richard Vaughn MRN: [id number] Date of Birth: 1951/10/10 Referring Provider (PT): Reesa Chew PA-C   Encounter Date: 03/18/2020  PT End of Session - 03/18/20 0945    Visit Number  3    Number of Visits  13    Date for PT Re-Evaluation  06/08/20   POC for 6 weeks, Cert for 90 days   Authorization Type  Medicare + BCBS (10th visit PN)    Progress Note Due on Visit  10    PT Start Time  [phone]   pt arriving late to session   PT Stop Time  1015    PT Time Calculation (min)  34 min    Equipment Utilized During Treatment  Gait belt    Activity Tolerance  Patient tolerated treatment well    Behavior During Therapy  [facility] for tasks assessed/performed       Past Medical History:  Diagnosis Date  . Barrett's esophagus   . Chronic back pain   . Gastritis    Mild  . Gastroparesis   . GERD (gastroesophageal reflux disease)   . Headache   . Hyperglycemia   . Hypertension   . Nephrolithiasis   . Renal cyst   . Stricture and stenosis of esophagus   . Stroke ([place]) 07-2015  . Vision abnormalities     Past Surgical History:  Procedure Laterality Date  . [place] SURGERY  2012  . COLONOSCOPY    . [place] SURGERY  2005, 2010   replaced L4 and L5  . UPPER GASTROINTESTINAL ENDOSCOPY      There were no vitals filed for this visit.  Subjective Assessment - 03/18/20 0944    Subjective  Patient reports no new issues. Patient reports getting a new rollator, in which he brought to today's session. Reports HEP is going well.    Patient is accompained by:  Family member   Wife   Pertinent History  Chronic Back Pain, Barrett's Esophagus, GERD, Hyperglycemia, HTN, CVA, Hyperlipidemia, vision abnormalities, ICH in the right thalamus/posterior limb of internal capsule    Limitations  Standing;Walking;House hold activities    Patient Stated Goals  "want to walk", "get back to how I was"    Currently in Pain?  Yes    Pain Score  5     Pain Location  Back    Pain Orientation  Right    Pain Descriptors / Indicators  Aching    Pain Type  Chronic pain                       [facility] Adult PT Treatment/Exercise - 03/18/20 1106      Transfers   Transfers  Sit to Stand;Stand to Sit    Sit to Stand  5: Supervision    Sit to Stand Details  Verbal cues for safe use of DME/AE;Verbal cues for technique    Sit to Stand Details (indicate cue type and reason)  Completed sit <> stands 2 x10 reps. Patient requiring CGA at times due to fatigue. Verbal cues for improved hip hinge, patient demonstrating decreased forward lean prior to sit <> stand.     Stand to Sit  5: Supervision    Stand to Sit Details (indicate cue type and reason)  Verbal cues for technique    Stand  to Sit Details  verbal cues for controlled descent    Comments  Completed transfer training to mat with rollator, patient demonstrating wide turn away from mat and requiring lots of backwards step to reach destination. PT educated on ambulating closer to surface prior to turning, allowing for patietn to be in close proximity to surface prior to sitting, for improved safety.       Ambulation/Gait   Ambulation/Gait  Yes    Ambulation/Gait Assistance  5: Supervision    Ambulation/Gait Assistance Details  Patient requiring verbal cues to increase foot clearance and improve heel to toe pattern, using auditory cues of shoe scuffing as feedback to improve toe clearance. Patietn demo improved gait speed with rollator and proper use of AD with turns    Ambulation Distance (Feet)  250 Feet   x 2; rest break between    Assistive device  4-wheeled walker   Rollator   Gait Pattern  Step-through pattern;Decreased hip/knee flexion - left    Ambulation Surface  Level;Indoor      Exercises   Exercises  Other  Exercises    Other Exercises   Peformed along counter: completed toe raises 2 x 10 reps, heel raises 2 x 10 reps, and side stepping 10' x 6. Verbal cues to keep hips/feet pointed forward for proper musculature recruitment with completion of side stepping.                PT Short Term Goals - 03/10/20 1430      PT SHORT TERM GOAL #1   Title  Patient will be indepdent with initial HEP    Baseline  HEP not yet initiated    Time  3    Period  Weeks    Status  New    Target Date  03/31/20      PT SHORT TERM GOAL #2   Title  Patient will improve 5x sit <> stand to < 30 seconds w/ UE use to demonstrate improved BLE strength    Baseline  36.06 secs w/ UE    Time  3    Period  Weeks    Status  New    Target Date  03/31/20      PT SHORT TERM GOAL #3   Title  Patient will improve TUG to <32 seconds for improved functional mobility    Baseline  37.44 sec    Time  3    Period  Weeks    Status  New    Target Date  03/31/20        PT Long Term Goals - 03/10/20 1435      PT LONG TERM GOAL #1   Title  Patient will be independent with final HEP    Time  6    Period  Weeks    Status  New    Target Date  04/21/20      PT LONG TERM GOAL #2   Title  Patient will improve TUG time to </= 25 secs with LRAD to reduce risk for falls    Baseline  37.44    Time  6    Period  Weeks    Status  New    Target Date  04/21/20      PT LONG TERM GOAL #3   Title  Pt will improve gait speed to >/=2.0 ft/sec. with LRAD to safely amb. in the community.    Baseline  1.36 ft    Time  6  Period  Weeks    Status  New    Target Date  04/21/20      PT LONG TERM GOAL #4   Title  Pt will improve BERG score to >/=45/56 to decr. falls risk.     Baseline  36/56    Time  6    Period  Weeks    Status  New    Target Date  04/21/20      PT LONG TERM GOAL #5   Title  Pt will amb. 600' over level surfaces with LRAD at MOD I level to demonstrate improved functional mobility.    Time  6    Period   Weeks    Status  New    Target Date  04/21/20      Additional Long Term Goals   Additional Long Term Goals  Yes      PT LONG TERM GOAL #6   Title  Patient will improve 5x sit <> stand to </= 20 seconds to demonstrate improved BLE strength and reduce risk for falls.    Baseline  36.06    Time  6    Period  Weeks    Status  New    Target Date  04/21/20            Plan - 03/18/20 1116    Clinical Impression Statement  Focused today's PT session on gait training with rollator that PT brought to therapy today. PT providing continous education on safety with AD during, proper brake management, and transfers with AD. Patient also verbalizing interest in aquatic therapy in today's session as he has participated prior. Will continue to to benefit form skilled PT services focused on strength, gait training, and improve overall functional mobility.    Personal Factors and Comorbidities  Comorbidity 3+    Comorbidities  Chronic Back Pain, Barrett's Esophagus, GERD, Hyperglycemia, HTN, CVA, Hyperlipidemia, vision abnormalities, ICH in the right thalamus/posterior limb of internal capsule    Examination-Activity Limitations  Bed Mobility;Transfers;Stairs;Stand    Examination-Participation Restrictions  Yard Work;Community Activity    Stability/Clinical Decision Making  Evolving/Moderate complexity    Rehab Potential  Good    PT Frequency  2x / week    PT Duration  6 weeks    PT Treatment/Interventions  ADLs/Self Care Home Management;Electrical Stimulation;Moist Heat;Cryotherapy;Gait training;Stair training;Functional mobility training;Therapeutic activities;DME Instruction;Therapeutic exercise;Balance training;Neuromuscular re-education;Patient/family education;Orthotic Fit/Training;Passive range of motion;Visual/perceptual remediation/compensation    PT Next Visit Plan  Gait and Transfer training with Rollator.    Recommended Other Services  Aquatic    Consulted and Agree with Plan of Care   Patient;Family member/caregiver    Family Member Consulted  Wife       Patient will benefit from skilled therapeutic intervention in order to improve the following deficits and impairments:  Abnormal gait, Decreased balance, Decreased endurance, Decreased mobility, Difficulty walking, Impaired vision/preception, Decreased knowledge of precautions, Decreased strength, Decreased safety awareness, Decreased knowledge of use of DME, Decreased activity tolerance  Visit Diagnosis: Other abnormalities of gait and mobility  Muscle weakness (generalized)  Nontraumatic hemorrhage of right cerebral hemisphere ([place])  Unsteadiness on feet     Problem List Patient Active Problem List   Diagnosis Date Noted  . Depressive reaction   . Sleep disturbance   . Urinary retention   . Bradycardia   . Essential hypertension   . Temperature elevated   . Left thyroid nodule   . Treatment-emergent central sleep apnea 10/25/2018  . 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) 10/25/2018  . Chronic pain syndrome   . Chronic bilateral low back pain   . Benign essential HTN   . Tobacco abuse   . Marijuana abuse   . Hyperlipidemia   . History of CVA with residual deficit   . Dysphagia, post-stroke   . ICH (intracerebral hemorrhage) (HCC) - R thalmic/PLIC d/t HTN 123456  . Insomnia 07/13/2017  . PAD (peripheral artery disease) ([place]) 07/13/2017  . Hyperlipidemia with target LDL less than 70 07/13/2017  . Stroke-like symptom 09/22/2016  . Paresthesia 09/22/2016  . CVA (cerebral vascular accident) ([place]) 09/22/2016  . Cerebrovascular accident (CVA) due to thrombosis of left carotid artery ([place]) 09/23/2015  . Primary snoring 09/23/2015  . Hypersomnia with sleep apnea 09/23/2015  . Obstructive sleep apnea 09/23/2015  . Lacunar infarct, acute ([place]) 08/25/2015  . Sleep apnea 08/25/2015  . Dysarthria   . CVD (cardiovascular disease) 08/02/2015  . Acute hyperglycemia 08/02/2015  .  Systolic hypertension with cerebrovascular disease 12/27/2014  . Epistaxis 07/28/2012  . HYPERGLYCEMIA 10/02/2010  . NEPHROLITHIASIS 05/23/2009  . BARRETTS ESOPHAGUS 02/20/2009  . Gastroparesis 02/20/2009  . RADICULOPATHY 10/21/2008  . GERD 10/08/2008  . ESOPHAGITIS 08/15/2008  . ESOPHAGEAL STRICTURE 08/15/2008  . GASTRITIS, ACUTE 08/15/2008  . RENAL CYST 08/14/2008  . TOBACCO ABUSE 08/08/2008  . HEMATURIA, MICROSCOPIC, HX OF 08/08/2008  . PULMONARY NODULE 01/10/2008    Jones Bales, PT, DPT 03/18/2020, 11:22 AM  [facility] [address] [place], [place], [address] Phone: [phone]   Fax:  [phone]  Name: Richard Vaughn MRN: [id number] Date of Birth: 14-Aug-1951

## 2020-03-18 NOTE — Progress Notes (Signed)
[facility] [address] [place]. [place] [address] [phone]       HOSPITAL FOLLOW UP NOTE  Richard Vaughn Date of Birth:  1951-07-25 Medical Record Number:  [id number]   Reason for Referral:  hospital stroke follow up    SUBJECTIVE:   CHIEF COMPLAINT:  Chief Complaint  Patient presents with  . Follow-up    rm 3, with brother and wife  . Cerebrovascular Accident    L hemiparesis, gait impairment, dysarthria    HPI:   Richard Vaughn is a 69 y.o. male with history of hypertension, hyperlipidemia, vision abnormalities, hyperglycemia, Barrett's esophagus,multiple lacunar infarcts, prior small hypertensive bleeds in bilateral deep gray matter structures with most recent ICH in the right thalamus/posterior limb of internal capsule in 2019 with mild residual left-sided weakness  who presented on 02/22/2020 with sudden onset of worsening dysarthria and left-sided weakness.   Stroke work-up revealed acute right thalamic hemorrhage secondary to history of HTN.  History of HTN stable during admission with long-term BP goal normotensive range.  LDL 73 with statin contraindicated with ICH.  No history of DM.  Other stroke risk factors include advanced age, PAD, tobacco use, history of substance abuse, obesity, prior history of stroke and family history of stroke.  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  Evaluated by therapies and recommended discharge to [name] for ongoing therapy needs and eventually discharged home on 03/07/2020.  Stroke: acute right thalamic hemorrhage secondary to hx HTN   Resultant dysarthria and left face and extremity weakness  Code Stroke CT Head - Positive for acute right thalamic hemorrhage (6 mL) with mild surrounding edema and minimal mass effect.  No intraventricular or extra-axial extension at this time. Underlying advanced chronic small vessel disease.   CTA H&N - No large vessel occlusion. Minimal atherosclerosis in the head and neck - although there is at least moderate stenosis suspected at the origin of the non-dominant left vertebral artery origin.  MRI head W & WO - 2.1 x 2.1 x 2.2 cm acute intraparenchymal hemorrhage centered at the right thalamus, unchanged from previous. Associated mild surrounding vasogenic edema and localized regional mass effect, with up to 3 mm of localized right-to-left shift. No underlying mass lesion, abnormal enhancement, or other abnormality identified. Multiple remote lacunar infarcts involving the left basal ganglia and thalamus.   2D Echo - EF 60 - 65%. No cardiac source of emboli identified.   Sars Corona Virus 2 - negative  LDL - 73  HgbA1c - 5.4   VTE prophylaxis - SCDs  aspirin 81 mg daily prior to admission, now on No antithrombotic  Therapy recommendations:  [name]  Disposition: [name]   Today, 03/19/2020, Richard Vaughn is being seen for hospital follow-up accompanied by his brother and wife.  Residual deficits left-sided weakness, gait impairment, dysarthria and mild to moderate cognitive communication deficits.  Continues to participate in outpatient therapies at neuro rehab with ongoing improvement.  Use of rollator walker outside of home denies any recent falls.  Blood pressure today 122/78.  History of mixed sleep apnea.  Has self discontinued CPAP approx 6 months ago due to difficulty tolerating masks.  Trialed 2 different types of masks previously but has increased anxiety with mask use.  Recent diagnosis of rotator cuff tear and questions undergoing surgical procedure.  No further  concerns at this time.       ROS:   14 system review of systems performed and negative with exception of weakness, gait impairment, speech impairment  PMH:  Past Medical History:  Diagnosis Date  . Barrett's  esophagus   . Chronic back pain   . Gastritis    Mild  . Gastroparesis   . GERD (gastroesophageal reflux disease)   . Headache   . Hyperglycemia   . Hypertension   . Nephrolithiasis   . Renal cyst   . Stricture and stenosis of esophagus   . Stroke ([place]) 07-2015  . Vision abnormalities     PSH:  Past Surgical History:  Procedure Laterality Date  . [place] SURGERY  2012  . COLONOSCOPY    . [place] SURGERY  2005, 2010   replaced L4 and L5  . UPPER GASTROINTESTINAL ENDOSCOPY      Social History:  Social History   Socioeconomic History  . Marital status: Married    Spouse name: Richard Vaughn  . Number of children: 3  . Years of education: Not on file  . Highest education level: Not on file  Occupational History  . Occupation: retired    Fish farm manager: [employer]  Tobacco Use  . Smoking status: Former Smoker    Packs/day: 0.50    Years: 30.00    Pack years: 15.00    Types: Cigarettes    Quit date: 04/15/2018    Years since quitting: 1.9  . Smokeless tobacco: Never Used  Substance and Sexual Activity  . Alcohol use: No    Alcohol/week: 0.0 standard drinks  . Drug use: Never    Comment: last use 04/2018  . Sexual activity: Yes  Other Topics Concern  . Not on file  Social History Narrative  . Not on file   Social Determinants of Health   Financial Resource Strain:   . Difficulty of Paying Living Expenses:   Food Insecurity:   . Worried About Charity fundraiser in the Last Year:   . Arboriculturist in the Last Year:   Transportation Needs:   . Film/video editor (Medical):   Marland Kitchen Lack of Transportation (Non-Medical):   Physical Activity:   . Days of Exercise per Week:   . Minutes of Exercise per Session:   Stress:   . Feeling of Stress :   Social Connections:   . Frequency of Communication with Friends and Family:   . Frequency of Social Gatherings with Friends and Family:   . Attends Religious Services:   . Active Member of Clubs or  Organizations:   . Attends Archivist Meetings:   Marland Kitchen Marital Status:   Intimate Partner Violence:   . Fear of Current or Ex-Partner:   . Emotionally Abused:   Marland Kitchen Physically Abused:   . Sexually Abused:     Family History:  Family History  Problem Relation Age of Onset  . Hypertension Father   . Stroke Father   . Hypertension Mother   . Liver cancer Mother   . Hypertension Sister        2 other sisters has also  . Stroke Sister   . Stroke Sister   . Hypertension Brother        2nd brother has also  . Colon cancer Neg Hx   . Esophageal cancer Neg Hx   . Rectal cancer Neg Hx   . Stomach cancer Neg Hx   . Colon polyps Neg  Hx     Medications:   Current Outpatient Medications on File Prior to Visit  Medication Sig Dispense Refill  . amantadine (SYMMETREL) 100 MG capsule Take 2 capsules (200 mg total) by mouth daily. 30 capsule 0  . esomeprazole (NEXIUM) 40 MG capsule Take 1 capsule (40 mg total) by mouth 2 (two) times daily before a meal. Please schedule a yearly follow up for any further refills.  Last seen 01-2019. (Patient taking differently: Take 40 mg by mouth 2 (two) times daily before a meal. ) 60 capsule 0  . melatonin 300 MCG TABS Take 5 tablets (1,500 mcg total) by mouth at bedtime.  0  . oxymorphone (OPANA) 10 MG tablet Take 10 mg by mouth in the morning and at bedtime.     . senna-docusate (SENOKOT-S) 8.6-50 MG tablet Take 1 tablet by mouth 2 (two) times daily.    . traZODone (DESYREL) 150 MG tablet Take 1 tablet (150 mg total) by mouth at bedtime. 30 tablet 0   No current facility-administered medications on file prior to visit.    Allergies:  No Known Allergies    OBJECTIVE:  Physical Exam  Vitals:   03/19/20 0821  BP: 122/78  Pulse: 74  Temp: (!) 97.3 F (36.3 C)  Weight: 245 lb (111.1 kg)  Height: 6\' 2"  (1.88 m)   Body mass index is 31.46 kg/m. No exam data present  General: well developed, well nourished,  pleasant middle-aged  African-American male, seated, in no evident distress Head: head normocephalic and atraumatic.   Neck: supple with no carotid or supraclavicular bruits Cardiovascular: regular rate and rhythm, no murmurs Musculoskeletal: no deformity Skin:  no rash/petichiae Vascular:  Normal pulses all extremities   Neurologic Exam Mental Status: Awake and fully alert. moderate dysarthria. Oriented to place and time. Recent and remote memory intact. Attention span, concentration and fund of knowledge appropriate during visit. Mood and affect appropriate.  Cranial Nerves: Fundoscopic exam reveals sharp disc margins. Pupils equal, briskly reactive to light. Extraocular movements full without nystagmus. Visual fields full to confrontation. Hearing intact. Facial sensation intact. Face, tongue, palate moves normally and symmetrically.  Motor: Normal bulk and tone. Normal strength in all tested extremity muscles except mild decreased left hand dexterity. Sensory.: intact to touch , pinprick , position and vibratory sensation.  Coordination: Rapid alternating movements normal in all extremities except slightly decreased left hand. Finger-to-nose and heel-to-shin performed accurately bilaterally. Gait and Station: Arises from chair without difficulty. Stance is normal. Gait demonstrates  hemiplegic gait with use of Rollator walker.  Tandem walk not attempted Reflexes: 1+ and symmetric. Toes downgoing.     NIHSS  1 Modified Rankin  2-3      ASSESSMENT: Richard Vaughn is a 69 y.o. year old male with recent [place] history in right thalamus/posterior limb in 2019 with residual mild left-sided weakness who presented with sudden onset of worsening dysarthria and left-sided weakness on 02/22/2020 with stroke work-up revealing acute right thalamic hemorrhage secondary to history of HTN. Vascular risk factors include multiple prior strokes including lacunar infarcts and small hypertensive bleeds, HTN, HLD, PAD, sleep apnea,  history of substance abuse and family history of stroke.  Residual deficits of subjective left-sided weakness, moderate dysarthria, gait impairment and mild cognitive communication deficit     PLAN:  1. Nontraumatic thalamic hemorrhage ([place]) - CT HEAD WO CONTRAST -Continue to participate in outpatient therapies with ongoing improvement -Repeat CT head and if bleed resolved, recommend restarting aspirin 81 mg daily for  secondary stroke prevention as well as history of PAD -Maintain strict control of hypertension with blood pressure goal below 130/90, diabetes with hemoglobin A1c goal below 6.5% and cholesterol with LDL cholesterol (bad cholesterol) goal below 70 mg/dL.  I also advised the patient to eat a healthy diet with plenty of whole grains, cereals, fruits and vegetables, exercise regularly with at least 30 minutes of continuous activity daily and maintain ideal body weight.  2. Essential hypertension -Stable.  Ongoing monitoring management by PCP  3. Hyperlipidemia LDL goal <70 -Recent LDL 73.  No indication for statin at this time due to near satisfactory LDL and recent ICH.  Request repeat levels with PCP in 2 to 3 months and initiate low-dose statin if LDL greater than 70  4. Mixed sleep apnea Difficulty tolerating CPAP.  Discussion regarding importance of sleep apnea management due to cardiovascular risk factors and multiple prior strokes.  Discussion with [facility] sleep clinic manager who plans on calling patient for appointment to further review different types of masks and further treatment options.  Not a candidate for hypoglossal nerve stimulation or oral device due to mixed apneas    Follow up in 3 months or call earlier if needed   I spent 45 minutes of face-to-face and non-face-to-face time with patient, wife and brother.  This included previsit chart review, lab review, study review, order entry, electronic health record documentation, patient education regarding recent  stroke, residual deficits, importance of managing stroke risk factors and answered all questions to patient and family's satisfaction     Frann Rider, [facility]  [facility] [address] [place], [place] [address]  Phone [phone] Fax [phone] Note: This document was prepared with digital dictation and possible smart phrase technology. Any transcriptional errors that result from this process are unintentional.

## 2020-03-19 ENCOUNTER — Ambulatory Visit (INDEPENDENT_AMBULATORY_CARE_PROVIDER_SITE_OTHER): Payer: Medicare Other | Admitting: Adult Health

## 2020-03-19 ENCOUNTER — Other Ambulatory Visit: Payer: Self-pay

## 2020-03-19 ENCOUNTER — Encounter: Payer: Self-pay | Admitting: Adult Health

## 2020-03-19 ENCOUNTER — Telehealth: Payer: Self-pay | Admitting: Adult Health

## 2020-03-19 VITALS — BP 122/78 | HR 74 | Temp 97.3°F | Ht 74.0 in | Wt 245.0 lb

## 2020-03-19 DIAGNOSIS — E785 Hyperlipidemia, unspecified: Secondary | ICD-10-CM

## 2020-03-19 DIAGNOSIS — M75122 Complete rotator cuff tear or rupture of left shoulder, not specified as traumatic: Secondary | ICD-10-CM | POA: Diagnosis not present

## 2020-03-19 DIAGNOSIS — G4739 Other sleep apnea: Secondary | ICD-10-CM | POA: Diagnosis not present

## 2020-03-19 DIAGNOSIS — I619 Nontraumatic intracerebral hemorrhage, unspecified: Secondary | ICD-10-CM

## 2020-03-19 DIAGNOSIS — I10 Essential (primary) hypertension: Secondary | ICD-10-CM | POA: Diagnosis not present

## 2020-03-19 NOTE — Telephone Encounter (Signed)
medicare/bcbs fed order sent to GI. No auth they will reach out to the patient to schedule.

## 2020-03-19 NOTE — Patient Instructions (Signed)
Repeat CT head to assess for resolution of bleed. If resolved, may consider initiating aspirin 81mg  daily   Continue to work with therapies for ongoing improvement  Continue to follow up with PCP regarding cholesterol and blood pressure management   You will be called by our sleep clinic to come in to further discuss mask options or need of further studies  Continue to monitor blood pressure at home  Maintain strict control of hypertension with blood pressure goal below 130/90, diabetes with hemoglobin A1c goal below 6.5% and cholesterol with LDL cholesterol (bad cholesterol) goal below 70 mg/dL. I also advised the patient to eat a healthy diet with plenty of whole grains, cereals, fruits and vegetables, exercise regularly and maintain ideal body weight.  Followup in the future with me in 3 months or call earlier if needed       Thank you for coming to see [place] at [facility]. I hope we have been able to provide you high quality care today.  You may receive a patient satisfaction survey over the next few weeks. We would appreciate your feedback and comments so that we may continue to improve ourselves and the health of our patients.

## 2020-03-20 ENCOUNTER — Ambulatory Visit: Payer: Medicare Other

## 2020-03-20 DIAGNOSIS — R41841 Cognitive communication deficit: Secondary | ICD-10-CM | POA: Diagnosis not present

## 2020-03-20 DIAGNOSIS — R2689 Other abnormalities of gait and mobility: Secondary | ICD-10-CM | POA: Diagnosis not present

## 2020-03-20 DIAGNOSIS — I612 Nontraumatic intracerebral hemorrhage in hemisphere, unspecified: Secondary | ICD-10-CM | POA: Diagnosis not present

## 2020-03-20 DIAGNOSIS — R471 Dysarthria and anarthria: Secondary | ICD-10-CM | POA: Diagnosis not present

## 2020-03-20 DIAGNOSIS — M6281 Muscle weakness (generalized): Secondary | ICD-10-CM

## 2020-03-20 DIAGNOSIS — R2681 Unsteadiness on feet: Secondary | ICD-10-CM | POA: Diagnosis not present

## 2020-03-20 DIAGNOSIS — R293 Abnormal posture: Secondary | ICD-10-CM

## 2020-03-20 NOTE — Therapy (Signed)
[facility] [address] [place], [place], [address] Phone: [phone]   Fax:  [phone]  Physical Therapy Treatment  Patient Details  Name: MAIKO IFILL MRN: [id number] Date of Birth: 09/28/1951 Referring Provider (PT): Reesa Chew PA-C   Encounter Date: 03/20/2020  PT End of Session - 03/20/20 1256    Visit Number  4    Number of Visits  13    Date for PT Re-Evaluation  06/08/20   POC for 6 weeks, Cert for 90 days   Authorization Type  Medicare + BCBS (10th visit PN)    Progress Note Due on Visit  10    PT Start Time  0940   pt arriving late to session   PT Stop Time  1015    PT Time Calculation (min)  35 min    Equipment Utilized During Treatment  Gait belt    Activity Tolerance  Patient tolerated treatment well    Behavior During Therapy  [facility] for tasks assessed/performed       Past Medical History:  Diagnosis Date  . Barrett's esophagus   . Chronic back pain   . Gastritis    Mild  . Gastroparesis   . GERD (gastroesophageal reflux disease)   . Headache   . Hyperglycemia   . Hypertension   . Nephrolithiasis   . Renal cyst   . Stricture and stenosis of esophagus   . Stroke ([place]) 07-2015  . Vision abnormalities     Past Surgical History:  Procedure Laterality Date  . [place] SURGERY  2012  . COLONOSCOPY    . [place] SURGERY  2005, 2010   replaced L4 and L5  . UPPER GASTROINTESTINAL ENDOSCOPY      There were no vitals filed for this visit.  Subjective Assessment - 03/20/20 0942    Subjective  No new complaints in today's session. No falls. Did report a near fall in the living room, tripped over a clothes basket but patient reporting he was able to catch himself.    Patient is accompained by:  Family member   Wife   Pertinent History  Chronic Back Pain, Barrett's Esophagus, GERD, Hyperglycemia, HTN, CVA, Hyperlipidemia, vision abnormalities, ICH in the right thalamus/posterior limb  of internal capsule    Limitations  Standing;Walking;House hold activities    Patient Stated Goals  "want to walk", "get back to how I was"    Currently in Pain?  No/denies                       [facility] Adult PT Treatment/Exercise - 03/20/20 0959      Transfers   Transfers  Sit to Stand;Stand to Sit    Sit to Stand  5: Supervision    Sit to Stand Details  Verbal cues for safe use of DME/AE;Verbal cues for technique    Sit to Stand Details (indicate cue type and reason)  verbal cues for improved forward lean for completion. patient still require multiple attempts to rise at times.     Stand to Sit  5: Supervision    Comments  PT continued to provide verbal cues and demonstation of walking up to mat and completing transfer, ensuring pt ambulates in close proximity to area that plan to sit, to avoid having to take multiple steps backwards.       Ambulation/Gait   Ambulation/Gait  Yes    Ambulation/Gait Assistance  5: Supervision;4: Min guard  Ambulation/Gait Assistance Details  Completed gait training on outdoor surfaces including pavement. Require CGA throughout due to catching of toe. Pt demo multiple instances of catching toe or hitting LLE on rollator. Verbal cues for improved clearance and hip/knee flexion as well as ensuring that body is centered with AD.  Increased fatigue with longer distances, demo increased instances of catching LLE and dragging foot.     Ambulation Distance (Feet)  750 Feet    Assistive device  4-wheeled walker    Gait Pattern  Step-through pattern;Decreased hip/knee flexion - left    Ambulation Surface  Unlevel;Outdoor;Paved      Neuro Re-ed    Neuro Re-ed Details   Alternating toe taps to cones with 1 light UE support from counter, 2 x 15 reps. focus on tap top of cone for improved coordination and balance. Also completed standing balance exercises on firm surface; including feet together with eyes closed, 3 x 30 secs. Intermittent CGA       Exercises   Exercises  Knee/Hip      Knee/Hip Exercises: Standing   Forward Step Up  Both;2 sets;15 reps;Hand Hold: 1;Step Height: 4"    Forward Step Up Limitations  completed forward step ups along counter, with 1 UE support. leading with LLE for improved strengthening. verbal cues required for foot placement.                PT Short Term Goals - 03/10/20 1430      PT SHORT TERM GOAL #1   Title  Patient will be indepdent with initial HEP    Baseline  HEP not yet initiated    Time  3    Period  Weeks    Status  New    Target Date  03/31/20      PT SHORT TERM GOAL #2   Title  Patient will improve 5x sit <> stand to < 30 seconds w/ UE use to demonstrate improved BLE strength    Baseline  36.06 secs w/ UE    Time  3    Period  Weeks    Status  New    Target Date  03/31/20      PT SHORT TERM GOAL #3   Title  Patient will improve TUG to <32 seconds for improved functional mobility    Baseline  37.44 sec    Time  3    Period  Weeks    Status  New    Target Date  03/31/20        PT Long Term Goals - 03/10/20 1435      PT LONG TERM GOAL #1   Title  Patient will be independent with final HEP    Time  6    Period  Weeks    Status  New    Target Date  04/21/20      PT LONG TERM GOAL #2   Title  Patient will improve TUG time to </= 25 secs with LRAD to reduce risk for falls    Baseline  37.44    Time  6    Period  Weeks    Status  New    Target Date  04/21/20      PT LONG TERM GOAL #3   Title  Pt will improve gait speed to >/=2.0 ft/sec. with LRAD to safely amb. in the community.    Baseline  1.36 ft    Time  6    Period  Weeks  Status  New    Target Date  04/21/20      PT LONG TERM GOAL #4   Title  Pt will improve BERG score to >/=45/56 to decr. falls risk.     Baseline  36/56    Time  6    Period  Weeks    Status  New    Target Date  04/21/20      PT LONG TERM GOAL #5   Title  Pt will amb. 600' over level surfaces with LRAD at MOD I level to  demonstrate improved functional mobility.    Time  6    Period  Weeks    Status  New    Target Date  04/21/20      Additional Long Term Goals   Additional Long Term Goals  Yes      PT LONG TERM GOAL #6   Title  Patient will improve 5x sit <> stand to </= 20 seconds to demonstrate improved BLE strength and reduce risk for falls.    Baseline  36.06    Time  6    Period  Weeks    Status  New    Target Date  04/21/20            Plan - 03/20/20 1257    Clinical Impression Statement  Continued today's PT session with focus on gait training on outdoor surfaces. Patient requiring freq verbal cues for improved toe clearance and hip/knee flexion with ambulation. Also requiring education on use of AD with turns and transfers to various surfaces. Patient will continue to benefit from skilled PT services progress toward all unment goals.    Personal Factors and Comorbidities  Comorbidity 3+    Comorbidities  Chronic Back Pain, Barrett's Esophagus, GERD, Hyperglycemia, HTN, CVA, Hyperlipidemia, vision abnormalities, ICH in the right thalamus/posterior limb of internal capsule    Examination-Activity Limitations  Bed Mobility;Transfers;Stairs;Stand    Examination-Participation Restrictions  Yard Work;Community Activity    Stability/Clinical Decision Making  Evolving/Moderate complexity    Rehab Potential  Good    PT Frequency  2x / week    PT Duration  6 weeks    PT Treatment/Interventions  ADLs/Self Care Home Management;Electrical Stimulation;Moist Heat;Cryotherapy;Gait training;Stair training;Functional mobility training;Therapeutic activities;DME Instruction;Therapeutic exercise;Balance training;Neuromuscular re-education;Patient/family education;Orthotic Fit/Training;Passive range of motion;Visual/perceptual remediation/compensation    PT Next Visit Plan  Gait and Transfer training with Rollator. Strengthening exercises, Standing Balance.    Recommended Other Services  Aquatic    Consulted  and Agree with Plan of Care  Patient;Family member/caregiver    Family Member Consulted  Wife       Patient will benefit from skilled therapeutic intervention in order to improve the following deficits and impairments:  Abnormal gait, Decreased balance, Decreased endurance, Decreased mobility, Difficulty walking, Impaired vision/preception, Decreased knowledge of precautions, Decreased strength, Decreased safety awareness, Decreased knowledge of use of DME, Decreased activity tolerance  Visit Diagnosis: Other abnormalities of gait and mobility  Muscle weakness (generalized)  Unsteadiness on feet  Abnormal posture     Problem List Patient Active Problem List   Diagnosis Date Noted  . Depressive reaction   . Sleep disturbance   . Urinary retention   . Bradycardia   . Essential hypertension   . Temperature elevated   . Left thyroid nodule   . Treatment-emergent central sleep apnea 10/25/2018  . 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 Chronic pain syndrome   . Chronic bilateral low back pain   .  Benign essential HTN   . Tobacco abuse   . Marijuana abuse   . Hyperlipidemia   . History of CVA with residual deficit   . Dysphagia, post-stroke   . ICH (intracerebral hemorrhage) (HCC) - R thalmic/PLIC d/t HTN 123456  . Insomnia 07/13/2017  . PAD (peripheral artery disease) ([place]) 07/13/2017  . Hyperlipidemia with target LDL less than 70 07/13/2017  . Stroke-like symptom 09/22/2016  . Paresthesia 09/22/2016  . CVA (cerebral vascular accident) ([place]) 09/22/2016  . Cerebrovascular accident (CVA) due to thrombosis of left carotid artery ([place]) 09/23/2015  . Primary snoring 09/23/2015  . Hypersomnia with sleep apnea 09/23/2015  . Obstructive sleep apnea 09/23/2015  . Lacunar infarct, acute ([place]) 08/25/2015  . Sleep apnea 08/25/2015  . Dysarthria   . CVD (cardiovascular disease) 08/02/2015  . Acute hyperglycemia 08/02/2015  . Systolic  hypertension with cerebrovascular disease 12/27/2014  . Epistaxis 07/28/2012  . HYPERGLYCEMIA 10/02/2010  . NEPHROLITHIASIS 05/23/2009  . BARRETTS ESOPHAGUS 02/20/2009  . Gastroparesis 02/20/2009  . RADICULOPATHY 10/21/2008  . GERD 10/08/2008  . ESOPHAGITIS 08/15/2008  . ESOPHAGEAL STRICTURE 08/15/2008  . GASTRITIS, ACUTE 08/15/2008  . RENAL CYST 08/14/2008  . TOBACCO ABUSE 08/08/2008  . HEMATURIA, MICROSCOPIC, HX OF 08/08/2008  . PULMONARY NODULE 01/10/2008    Jones Bales, PT, DPT 03/20/2020, 1:01 PM  [facility] [address] [place], [place], [address] Phone: [phone]   Fax:  [phone]  Name: GIANFRANCO ELICK MRN: [id number] Date of Birth: 1950-12-28

## 2020-03-24 ENCOUNTER — Telehealth: Payer: Self-pay | Admitting: *Deleted

## 2020-03-24 NOTE — Telephone Encounter (Signed)
Message Routed to PCP for review. 

## 2020-03-24 NOTE — Telephone Encounter (Signed)
Patient wife called nurse triage on 03/22/2019 at 9:42 PM. Patient wife reports the pt got a cortisone shot for a rotator cuff tear 3 days ago. He has had hiccups since then. Patient was given home advice and advise to f/u at a facility undecided.  Clinic RN does not see where patient was seen for this anywhere over the weekend.

## 2020-03-25 NOTE — Telephone Encounter (Signed)
Tried calling patient, left message to return call to office.

## 2020-03-25 NOTE — Telephone Encounter (Signed)
Can you please check on Mr Naimi and ask if still having hiccups. Thanks, BJ

## 2020-03-26 ENCOUNTER — Other Ambulatory Visit: Payer: Self-pay

## 2020-03-26 ENCOUNTER — Ambulatory Visit: Payer: Medicare Other

## 2020-03-26 ENCOUNTER — Ambulatory Visit: Payer: Medicare Other | Admitting: Occupational Therapy

## 2020-03-26 DIAGNOSIS — R471 Dysarthria and anarthria: Secondary | ICD-10-CM | POA: Diagnosis not present

## 2020-03-26 DIAGNOSIS — R278 Other lack of coordination: Secondary | ICD-10-CM

## 2020-03-26 DIAGNOSIS — R270 Ataxia, unspecified: Secondary | ICD-10-CM

## 2020-03-26 DIAGNOSIS — R293 Abnormal posture: Secondary | ICD-10-CM

## 2020-03-26 DIAGNOSIS — M6281 Muscle weakness (generalized): Secondary | ICD-10-CM

## 2020-03-26 DIAGNOSIS — R2689 Other abnormalities of gait and mobility: Secondary | ICD-10-CM

## 2020-03-26 DIAGNOSIS — R2681 Unsteadiness on feet: Secondary | ICD-10-CM

## 2020-03-26 DIAGNOSIS — G8929 Other chronic pain: Secondary | ICD-10-CM

## 2020-03-26 DIAGNOSIS — I69354 Hemiplegia and hemiparesis following cerebral infarction affecting left non-dominant side: Secondary | ICD-10-CM

## 2020-03-26 DIAGNOSIS — M25512 Pain in left shoulder: Secondary | ICD-10-CM

## 2020-03-26 DIAGNOSIS — R41842 Visuospatial deficit: Secondary | ICD-10-CM

## 2020-03-26 DIAGNOSIS — R41841 Cognitive communication deficit: Secondary | ICD-10-CM | POA: Diagnosis not present

## 2020-03-26 DIAGNOSIS — I612 Nontraumatic intracerebral hemorrhage in hemisphere, unspecified: Secondary | ICD-10-CM | POA: Diagnosis not present

## 2020-03-26 NOTE — Telephone Encounter (Signed)
Tried calling patient to follow-up. Left detailed message to return call to office.

## 2020-03-26 NOTE — Therapy (Signed)
[facility] [address] [place], [place], [address] Phone: [phone]   Fax:  [phone]  Occupational Therapy Evaluation  Patient Details  Name: Richard Vaughn MRN: [id number] Date of Birth: Apr 03, 1951 Referring Provider (OT): Reesa Chew, [place]   Encounter Date: 03/26/2020  OT End of Session - 03/26/20 1448    Visit Number  1    Number of Visits  25    Date for OT Re-Evaluation  06/26/20    Authorization Type  MCR, BS/BS    Authorization - Visit Number  1    Authorization - Number of Visits  10    Progress Note Due on Visit  10    OT Start Time  0815   Pt arrived 15 min late)   OT Stop Time  0850    OT Time Calculation (min)  35 min    Activity Tolerance  Patient tolerated treatment well    Behavior During Therapy  [facility] for tasks assessed/performed       Past Medical History:  Diagnosis Date  . Barrett's esophagus   . Chronic back pain   . Gastritis    Mild  . Gastroparesis   . GERD (gastroesophageal reflux disease)   . Headache   . Hyperglycemia   . Hypertension   . Nephrolithiasis   . Renal cyst   . Stricture and stenosis of esophagus   . Stroke ([place]) 07-2015  . Vision abnormalities     Past Surgical History:  Procedure Laterality Date  . [place] SURGERY  2012  . COLONOSCOPY    . [place] SURGERY  2005, 2010   replaced L4 and L5  . UPPER GASTROINTESTINAL ENDOSCOPY      There were no vitals filed for this visit.  Subjective Assessment - 03/26/20 0816    Patient is accompanied by:  Family member   wife   Pertinent History  Rt thalamic hemmorhage w/ Lt hemiparesis 02/22/20. PMH: Stroke 2019 w/ Lt hemiparesis, HLD, HTN, GERD, Barretts esophagus    Limitations  fall risk, no driving, Lt shoulder rotator cuff tear (see MRI results 03/14/20)    Patient Stated Goals  Return to normal, driving, baking    Currently in Pain?  No/denies        [facility] OT Assessment - 03/26/20 0001      Assessment   Medical Diagnosis  R Thalamic Hemorrhage    Referring Provider (OT)  Reesa Chew, PA-C    Onset Date/Surgical Date  02/22/20    Hand Dominance  Right    Prior Therapy  Inpatient Rehab   previous OP therapy for stroke in 2019     Precautions   Precautions  Fall    Precaution Comments  Lt shoulder rotator cuff tear      Home  Environment   Bathroom Shower/Tub  Walk-in Shower   shower seat, grab bars   Additional Comments  Pt lives with wife, step son and 2 grown grandchildren in 2 story house but lives on main level, 4 steps to enter.     Lives With  Family      Prior Function   Level of Independence  Independent with community mobility without device;Independent with household mobility without device;Independent with basic ADLs    Vocation  Retired    Leisure  Gardening      ADL   Eating/Feeding  Modified independent   pt reports missing mouth   Grooming  Modified independent    Upper  Body Bathing  Modified independent    Lower Body Bathing  Modified independent    Upper Body Dressing  Increased time    Lower Body Dressing  Increased time   occasional assist for socks   Toilet Transfer  Modified independent    Toileting - Clothing Manipulation  Modified independent    Toileting -  Hygiene  Independent    Tub/Shower Transfer  Minimal assistance    ADL comments  Decreased dynamic standing      IADL   Shopping  Completely unable to shop   But did prior to this stroke   Light Housekeeping  Does not participate in any housekeeping tasks   but vacuumed prior to this stroke   Meal Prep  Needs to have meals prepared and served   wife does most cooking, but pt reports baking prior to    [employer] on family or friends for transportation    Medication Management  Is responsible for taking medication in correct dosages at correct time      Mobility   Mobility Status Comments  uses rollator      Written Expression   Dominant Hand  Right     Handwriting  Mild micrographia      Vision - History   Baseline Vision  Wears glasses all the time    Additional Comments  denies changes since stroke      Cognition   Area of Impairment  Attention;Awareness    Current Attention Level  Sustained    Awareness  Intellectual;Emergent    Awareness Comments  Stated focus was worse than pre-CVA. Pt did not correct nor indicate awareness of mistakes on test items from speech eval    Cognition Comments  Pt reports memory ok but focus is off       Observation/Other Assessments   Observations  Pain Lt shoulder w/ abduction d/t rotator cuff tear. Pt with ataxia LUE and possible mild apraxia       Sensation   Light Touch  Appears Intact      Coordination   Fine Motor Movements are Fluid and Coordinated  No    9 Hole Peg Test  Right;Left    Right 9 Hole Peg Test  31.97 sec    Left 9 Hole Peg Test  69.06 sec    Coordination  mild ataxia      Perception   Perception  Impaired    Inattention/Neglect  Impaired - to be further tested in functual context   reports dropping items from Lt hand (normal grip & sensation     Praxis   Praxis  --   ? impaired     Edema   Edema  none      ROM / Strength   AROM / PROM / Strength  AROM;Strength      AROM   Overall AROM   Within functional limits for tasks performed    Overall AROM Comments  BUE AROM WFL's however Lt with slightly decreased control and pain w/ abduction d/t rotator cuff tear      Strength   Overall Strength Comments  not tested LUE d/t rotator cuff tear      Hand Function   Right Hand Grip (lbs)  87 lbs    Left Hand Grip (lbs)  80 lbs                        OT Short Term Goals - 03/26/20  [place] #1   Title  Pt will be mod I with HEP for coordination for LUE, balance    Time  6    Period  Weeks    Status  New      OT SHORT TERM GOAL #2   Title  Pt will consistently don socks and tie shoes    Time  6    Period  Weeks    Status   New      OT SHORT TERM GOAL #3   Title  Pt will demonstrate improved coordination as evidenced by decreasing time on 9 hole peg with LUE by at least 10 seconds to assist with functional fine motor tasks.    Baseline  69.06 sec    Time  6    Period  Weeks    Status  New      OT SHORT TERM GOAL #4   Title  Pt will report overall less drops Lt hand during the day attending to Lt side consistently    Time  6    Period  Weeks    Status  New      OT SHORT TERM GOAL #5   Title  Pt will perform dynamic standing tasks w/ supervision without LOB    Time  6    Period  Weeks    Status  New        OT Long Term Goals - 03/26/20 1500      OT LONG TERM GOAL #1   Title  Pt will be independent with updated HEP including aquatic HEP prn    Time  12    Period  Weeks    Status  New      OT LONG TERM GOAL #2   Title  Pt will be mod I with shower transfers    Time  12    Period  Weeks    Status  New      OT LONG TERM GOAL #3   Title  Pt will be mod I with hot meal prep with at least two items.    Time  12    Period  Weeks    Status  New      OT LONG TERM GOAL #4   Title  Pt demonstrate improved coordination as evidenced by decreasing time on 9 hole peg by at least 20 seconds with LUE.    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) and pay at register    Time  12    Period  Weeks    Status  New      Long Term Additional Goals   Additional Long Term Goals  Yes      OT LONG TERM GOAL #6   Title  Pt will perform environmental scanning during ambulation with simple physical task at 90% or greater accuracy    Time  12    Period  Weeks    Status  New            Plan - 03/26/20 1450    Clinical Impression Statement  Pt is a 69 y.o. male who presents to [facility] s/p Rt thalamic hemorrhage on 02/22/20  w/ Lt sided weakness. Pt with  previous h/o stroke in 2019 and was seen at this clinic, made almost full recovery per pt/wife. Pt also has rotator cuff tear Lt shoulder w/ pain during abduction. Other PMH includes: chronic back pain, GERD, HTN, HLD, Barretts esophagus. Pt presents today with decreased coordination Lt hand, decreased strength LUE, decreased balance, mild ataxia and Lt inattention. Pt would benefit from O.T. to address these deficits and increase safety and independence with ADLS    OT Occupational Profile and History  Detailed Assessment- Review of Records and additional review of physical, cognitive, psychosocial history related to current functional performance    Occupational performance deficits (Please refer to evaluation for details):  ADL's;IADL's;Leisure    Body Structure / Function / Physical Skills  ADL;ROM;IADL;Balance;Body mechanics;Mobility;Strength;Coordination;FMC;Pain;UE functional use;Proprioception;Decreased knowledge of use of DME    Cognitive Skills  Attention    Rehab Potential  Good    Clinical Decision Making  Several treatment options, min-mod task modification necessary    Comorbidities Affecting Occupational Performance:  Presence of comorbidities impacting occupational performance    Comorbidities impacting occupational performance description:  Lt rotator cuff tear    Modification or Assistance to Complete Evaluation   Min-Moderate modification of tasks or assist with assess necessary to complete eval    OT Frequency  2x / week    OT Duration  12 weeks    OT Treatment/Interventions  Self-care/ADL training;Therapeutic exercise;Functional Mobility Training;Aquatic Therapy;Neuromuscular education;Manual Therapy;Splinting;Energy conservation;Therapeutic activities;DME and/or AE instruction;Cognitive remediation/compensation;Visual/perceptual remediation/compensation;Passive range of motion;Patient/family education;Moist Heat    Plan  initiate HEP for Lt hand coordination and shoulder (take into  consideration rotator cuff tear), begin discussion of possible aquatic therapy    Consulted and Agree with Plan of Care  Patient;Family member/caregiver    Family Member Consulted  wife       Patient will benefit from skilled therapeutic intervention in order to improve the following deficits and impairments:   Body Structure / Function / Physical Skills: ADL, ROM, IADL, Balance, Body mechanics, Mobility, Strength, Coordination, FMC, Pain, UE functional use, Proprioception, Decreased knowledge of use of DME Cognitive Skills: Attention     Visit Diagnosis: Hemiplegia and hemiparesis following cerebral infarction affecting left non-dominant side (HCC)  Other lack of coordination  Unsteadiness on feet  Ataxia  Muscle weakness (generalized)  Visuospatial deficit  Chronic left shoulder pain    Problem List Patient Active Problem List   Diagnosis Date Noted  . Depressive reaction   . Sleep disturbance   . Urinary retention   . Bradycardia   . Essential hypertension   . Temperature elevated   . Left thyroid nodule   . Treatment-emergent central sleep apnea 10/25/2018  . 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 Chronic pain syndrome   . Chronic bilateral low back pain   . Benign essential HTN   . Tobacco abuse   . Marijuana abuse   . Hyperlipidemia   . History of CVA with residual deficit   . Dysphagia, post-stroke   . ICH (intracerebral hemorrhage) (HCC) - R thalmic/PLIC d/t HTN 123456  . Insomnia 07/13/2017  . PAD (peripheral artery disease) ([place]) 07/13/2017  . Hyperlipidemia with target LDL less than 70 07/13/2017  . Stroke-like symptom 09/22/2016  . Paresthesia 09/22/2016  . CVA (cerebral vascular accident) ([place]) 09/22/2016  . Cerebrovascular accident (CVA) due to thrombosis of left carotid artery ([place]) 09/23/2015  . Primary snoring 09/23/2015  . Hypersomnia with sleep apnea 09/23/2015  . Obstructive  sleep apnea  09/23/2015  . Lacunar infarct, acute ([place]) 08/25/2015  . Sleep apnea 08/25/2015  . Dysarthria   . CVD (cardiovascular disease) 08/02/2015  . Acute hyperglycemia 08/02/2015  . Systolic hypertension with cerebrovascular disease 12/27/2014  . Epistaxis 07/28/2012  . HYPERGLYCEMIA 10/02/2010  . NEPHROLITHIASIS 05/23/2009  . BARRETTS ESOPHAGUS 02/20/2009  . Gastroparesis 02/20/2009  . RADICULOPATHY 10/21/2008  . GERD 10/08/2008  . ESOPHAGITIS 08/15/2008  . ESOPHAGEAL STRICTURE 08/15/2008  . GASTRITIS, ACUTE 08/15/2008  . RENAL CYST 08/14/2008  . TOBACCO ABUSE 08/08/2008  . HEMATURIA, MICROSCOPIC, HX OF 08/08/2008  . PULMONARY NODULE 01/10/2008    Carey Bullocks, OTR/L 03/26/2020, 3:03 PM  [facility] [address] [place], [place], [address] Phone: [phone]   Fax:  [phone]  Name: Richard Vaughn MRN: [id number] Date of Birth: 1951-10-22

## 2020-03-26 NOTE — Therapy (Signed)
[facility] [address] [place], [place], [address] Phone: [phone]   Fax:  [phone]  Physical Therapy Treatment  Patient Details  Name: Richard Vaughn MRN: [id number] Date of Birth: 1951-03-14 Referring Provider (PT): Reesa Chew PA-C   Encounter Date: 03/26/2020  PT End of Session - 03/26/20 0948    Visit Number  5    Number of Visits  13    Date for PT Re-Evaluation  06/08/20   POC for 6 weeks, Cert for 90 days   Authorization Type  Medicare + BCBS (10th visit PN)    Progress Note Due on Visit  10    PT Start Time  0850    PT Stop Time  0932    PT Time Calculation (min)  42 min    Equipment Utilized During Treatment  Gait belt    Activity Tolerance  Patient tolerated treatment well    Behavior During Therapy  [facility] for tasks assessed/performed       Past Medical History:  Diagnosis Date  . Barrett's esophagus   . Chronic back pain   . Gastritis    Mild  . Gastroparesis   . GERD (gastroesophageal reflux disease)   . Headache   . Hyperglycemia   . Hypertension   . Nephrolithiasis   . Renal cyst   . Stricture and stenosis of esophagus   . Stroke ([place]) 07-2015  . Vision abnormalities     Past Surgical History:  Procedure Laterality Date  . [place] SURGERY  2012  . COLONOSCOPY    . [place] SURGERY  2005, 2010   replaced L4 and L5  . UPPER GASTROINTESTINAL ENDOSCOPY      There were no vitals filed for this visit.  Subjective Assessment - 03/26/20 0853    Subjective  Reports no new complaints. Wife reporting patient missed the bed and slide down until the floor. No injuries, and was able to get up by himself.    Patient is accompained by:  Family member   Wife   Pertinent History  Chronic Back Pain, Barrett's Esophagus, GERD, Hyperglycemia, HTN, CVA, Hyperlipidemia, vision abnormalities, ICH in the right thalamus/posterior limb of internal capsule    Limitations   Standing;Walking;House hold activities    Patient Stated Goals  "want to walk", "get back to how I was"    Currently in Pain?  Yes    Pain Score  5     Pain Location  Back    Pain Orientation  Mid;Lower    Pain Descriptors / Indicators  Aching    Pain Type  Chronic pain    Pain Onset  More than a month ago    Pain Frequency  Intermittent                       [facility] Adult PT Treatment/Exercise - 03/26/20 0855      Transfers   Transfers  Sit to Stand;Stand to Sit    Sit to Stand  5: Supervision    Sit to Stand Details  Verbal cues for safe use of DME/AE;Verbal cues for technique    Stand to Sit  5: Supervision    Stand to Sit Details (indicate cue type and reason)  Verbal cues for technique    Comments  Completed sit <> stands from mat with UE support x 10 reps with focus on controlled descent. PT providing manual faciliation to lean forward, continued to demo posterior  LOB when does not come forward enough. Completed 10 reps with 2" step up RLE to promote LLE strengthening and weight shift.       Ambulation/Gait   Ambulation/Gait  Yes    Ambulation/Gait Assistance  5: Supervision;4: Min guard    Ambulation/Gait Assistance Details  Gait training with AD, requiring VC for improved hip/knee flexion and toe clearance.     Ambulation Distance (Feet)  345 Feet    Assistive device  4-wheeled walker    Gait Pattern  Step-through pattern;Decreased hip/knee flexion - left    Ambulation Surface  Level;Indoor    Stairs  Yes    Stairs Assistance  4: Min guard    Stair Management Technique  Two rails;Step to pattern    Number of Stairs  4    Height of Stairs  6    Gait Comments  Walk 30 ft w/o AD to <> from // bars. Patient demos decreased gait speed w/o AD and unsteadiness.       Neuro Re-ed    Neuro Re-ed Details   Alternating toe taps to cones with light UE support, completed in // bars. 2 x 10 reps.           Balance Exercises - 03/26/20 0946      Balance  Exercises: Standing   Standing Eyes Opened  Narrow base of support (BOS);Foam/compliant surface;Head turns;Other reps (comment)   2 x 10   Standing Eyes Closed  Narrow base of support (BOS);Foam/compliant surface;4 reps;30 secs          PT Short Term Goals - 03/10/20 1430      PT SHORT TERM GOAL #1   Title  Patient will be indepdent with initial HEP    Baseline  HEP not yet initiated    Time  3    Period  Weeks    Status  New    Target Date  03/31/20      PT SHORT TERM GOAL #2   Title  Patient will improve 5x sit <> stand to < 30 seconds w/ UE use to demonstrate improved BLE strength    Baseline  36.06 secs w/ UE    Time  3    Period  Weeks    Status  New    Target Date  03/31/20      PT SHORT TERM GOAL #3   Title  Patient will improve TUG to <32 seconds for improved functional mobility    Baseline  37.44 sec    Time  3    Period  Weeks    Status  New    Target Date  03/31/20        PT Long Term Goals - 03/10/20 1435      PT LONG TERM GOAL #1   Title  Patient will be independent with final HEP    Time  6    Period  Weeks    Status  New    Target Date  04/21/20      PT LONG TERM GOAL #2   Title  Patient will improve TUG time to </= 25 secs with LRAD to reduce risk for falls    Baseline  37.44    Time  6    Period  Weeks    Status  New    Target Date  04/21/20      PT LONG TERM GOAL #3   Title  Pt will improve gait speed to >/=2.0 ft/sec. with LRAD to  safely amb. in the community.    Baseline  1.36 ft    Time  6    Period  Weeks    Status  New    Target Date  04/21/20      PT LONG TERM GOAL #4   Title  Pt will improve BERG score to >/=45/56 to decr. falls risk.     Baseline  36/56    Time  6    Period  Weeks    Status  New    Target Date  04/21/20      PT LONG TERM GOAL #5   Title  Pt will amb. 600' over level surfaces with LRAD at MOD I level to demonstrate improved functional mobility.    Time  6    Period  Weeks    Status  New    Target  Date  04/21/20      Additional Long Term Goals   Additional Long Term Goals  Yes      PT LONG TERM GOAL #6   Title  Patient will improve 5x sit <> stand to </= 20 seconds to demonstrate improved BLE strength and reduce risk for falls.    Baseline  36.06    Time  6    Period  Weeks    Status  New    Target Date  04/21/20            Plan - 03/26/20 0949    Clinical Impression Statement  Today's skilled PT session focused on continued transfer training and gait training. PT focused on improved sit <> stand with manual faciliation. Pt still demonstrates posterior LOB with sit <> stands at times. Pt will continue benefit from skilled PT services to improve overall functional mobility.    Personal Factors and Comorbidities  Comorbidity 3+    Comorbidities  Chronic Back Pain, Barrett's Esophagus, GERD, Hyperglycemia, HTN, CVA, Hyperlipidemia, vision abnormalities, ICH in the right thalamus/posterior limb of internal capsule    Examination-Activity Limitations  Bed Mobility;Transfers;Stairs;Stand    Examination-Participation Restrictions  Yard Work;Community Activity    Stability/Clinical Decision Making  Evolving/Moderate complexity    Rehab Potential  Good    PT Frequency  2x / week    PT Duration  6 weeks    PT Treatment/Interventions  ADLs/Self Care Home Management;Electrical Stimulation;Moist Heat;Cryotherapy;Gait training;Stair training;Functional mobility training;Therapeutic activities;DME Instruction;Therapeutic exercise;Balance training;Neuromuscular re-education;Patient/family education;Orthotic Fit/Training;Passive range of motion;Visual/perceptual remediation/compensation    PT Next Visit Plan  Continue gait and transfer training with Rollator. Strengthening exercises, Standing Balance. Check STG's.    Consulted and Agree with Plan of Care  Patient;Family member/caregiver    Family Member Consulted  Wife       Patient will benefit from skilled therapeutic intervention in  order to improve the following deficits and impairments:  Abnormal gait, Decreased balance, Decreased endurance, Decreased mobility, Difficulty walking, Impaired vision/preception, Decreased knowledge of precautions, Decreased strength, Decreased safety awareness, Decreased knowledge of use of DME, Decreased activity tolerance  Visit Diagnosis: Other abnormalities of gait and mobility  Muscle weakness (generalized)  Unsteadiness on feet  Abnormal posture     Problem List Patient Active Problem List   Diagnosis Date Noted  . Depressive reaction   . Sleep disturbance   . Urinary retention   . Bradycardia   . Essential hypertension   . Temperature elevated   . Left thyroid nodule   . Treatment-emergent central sleep apnea 10/25/2018  . 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  Chronic pain syndrome   . Chronic bilateral low back pain   . Benign essential HTN   . Tobacco abuse   . Marijuana abuse   . Hyperlipidemia   . History of CVA with residual deficit   . Dysphagia, post-stroke   . ICH (intracerebral hemorrhage) (HCC) - R thalmic/PLIC d/t HTN 123456  . Insomnia 07/13/2017  . PAD (peripheral artery disease) ([place]) 07/13/2017  . Hyperlipidemia with target LDL less than 70 07/13/2017  . Stroke-like symptom 09/22/2016  . Paresthesia 09/22/2016  . CVA (cerebral vascular accident) ([place]) 09/22/2016  . Cerebrovascular accident (CVA) due to thrombosis of left carotid artery ([place]) 09/23/2015  . Primary snoring 09/23/2015  . Hypersomnia with sleep apnea 09/23/2015  . Obstructive sleep apnea 09/23/2015  . Lacunar infarct, acute ([place]) 08/25/2015  . Sleep apnea 08/25/2015  . Dysarthria   . CVD (cardiovascular disease) 08/02/2015  . Acute hyperglycemia 08/02/2015  . Systolic hypertension with cerebrovascular disease 12/27/2014  . Epistaxis 07/28/2012  . HYPERGLYCEMIA 10/02/2010  . NEPHROLITHIASIS 05/23/2009  . BARRETTS ESOPHAGUS 02/20/2009   . Gastroparesis 02/20/2009  . RADICULOPATHY 10/21/2008  . GERD 10/08/2008  . ESOPHAGITIS 08/15/2008  . ESOPHAGEAL STRICTURE 08/15/2008  . GASTRITIS, ACUTE 08/15/2008  . RENAL CYST 08/14/2008  . TOBACCO ABUSE 08/08/2008  . HEMATURIA, MICROSCOPIC, HX OF 08/08/2008  . PULMONARY NODULE 01/10/2008    Jones Bales, PT, DPT 03/26/2020, 9:59 AM  [facility] [address] [place], [place], [address] Phone: [phone]   Fax:  [phone]  Name: Richard Vaughn MRN: [id number] Date of Birth: 1951-08-21

## 2020-03-28 ENCOUNTER — Other Ambulatory Visit: Payer: Self-pay

## 2020-03-28 ENCOUNTER — Ambulatory Visit: Payer: Medicare Other

## 2020-03-28 DIAGNOSIS — R2689 Other abnormalities of gait and mobility: Secondary | ICD-10-CM | POA: Diagnosis not present

## 2020-03-28 DIAGNOSIS — R471 Dysarthria and anarthria: Secondary | ICD-10-CM | POA: Diagnosis not present

## 2020-03-28 DIAGNOSIS — M6281 Muscle weakness (generalized): Secondary | ICD-10-CM | POA: Diagnosis not present

## 2020-03-28 DIAGNOSIS — R41841 Cognitive communication deficit: Secondary | ICD-10-CM

## 2020-03-28 DIAGNOSIS — R2681 Unsteadiness on feet: Secondary | ICD-10-CM | POA: Diagnosis not present

## 2020-03-28 DIAGNOSIS — I612 Nontraumatic intracerebral hemorrhage in hemisphere, unspecified: Secondary | ICD-10-CM | POA: Diagnosis not present

## 2020-03-28 NOTE — Therapy (Signed)
[facility] [address] [place], [place], [address] Phone: [phone]   Fax:  [phone]  Physical Therapy Treatment  Patient Details  Name: Richard Vaughn MRN: [id number] Date of Birth: [birth year] Referring Provider (PT): Reesa Chew PA-C   Encounter Date: 03/28/2020  PT End of Session - 03/28/20 0939    Visit Number  6    Number of Visits  13    Date for PT Re-Evaluation  06/08/20   POC for 6 weeks, Cert for 90 days   Authorization Type  Medicare + BCBS (10th visit PN)    Progress Note Due on Visit  10    PT Start Time  0935    PT Stop Time  1018    PT Time Calculation (min)  43 min    Equipment Utilized During Treatment  Gait belt    Activity Tolerance  Patient tolerated treatment well    Behavior During Therapy  WFL for tasks assessed/performed       Past Medical History:  Diagnosis Date  . Barrett's esophagus   . Chronic back pain   . Gastritis    Mild  . Gastroparesis   . GERD (gastroesophageal reflux disease)   . Headache   . Hyperglycemia   . Hypertension   . Nephrolithiasis   . Renal cyst   . Stricture and stenosis of esophagus   . Stroke ([facility]) 07-2015  . Vision abnormalities     Past Surgical History:  Procedure Laterality Date  . [place] SURGERY  2012  . COLONOSCOPY    . [place] SURGERY  2005, 2010   replaced L4 and L5  . UPPER GASTROINTESTINAL ENDOSCOPY      There were no vitals filed for this visit.  Subjective Assessment - 03/28/20 0939    Subjective  Pt reports that he had a fall last night trying to get his shoes on when standing. Fell to right side. Denies any injuries or pain. Wife was able to assist him up.    Patient is accompained by:  Family member   Wife   Pertinent History  Chronic Back Pain, Barrett's Esophagus, GERD, Hyperglycemia, HTN, CVA, Hyperlipidemia, vision abnormalities, ICH in the right thalamus/posterior limb of internal capsule    Limitations   Standing;Walking;House hold activities    Patient Stated Goals  "want to walk", "get back to how I was"    Currently in Pain?  Yes    Pain Score  4     Pain Location  Back    Pain Orientation  Lower    Pain Descriptors / Indicators  Aching    Pain Type  Chronic pain    Pain Onset  More than a month ago                        [facility] Adult PT Treatment/Exercise - 03/28/20 0945      Transfers   Transfers  Sit to Stand;Stand to Sit    Sit to Stand  5: Supervision    Sit to Stand Details  Verbal cues for technique    Sit to Stand Details (indicate cue type and reason)  Pt was cued to bring feet back under knees prior to rising.    Five time sit to stand comments   Pt performed sit to stand 5 x 2 from edge of mat. First set PT did not provide cues and patient had to take a couple attempts to  rise as leaning posterior at times. Second set instructed to bring feet back and lean forward and able to rise each time. 5  x sit to stand=37 sec with UE from mat. Pt paused towards end to get feet back.    Stand to Sit  5: Supervision    Stand to Sit Details (indicate cue type and reason)  Verbal cues for technique      Ambulation/Gait   Ambulation/Gait  Yes    Ambulation/Gait Assistance  5: Supervision    Ambulation/Gait Assistance Details  Pt was given verbal cues to try to increase left foot clearance and get more heel strike. He did have a few times catching left toes on floor but able to recover supervision.    Ambulation Distance (Feet)  460 Feet    Assistive device  Rollator    Gait Pattern  Step-through pattern;Decreased dorsiflexion - left    Ambulation Surface  Level;Indoor      Standardized Balance Assessment   Standardized Balance Assessment  Timed Up and Go Test      Timed Up and Go Test   TUG  Normal TUG    Normal TUG (seconds)  40   40 sec for 2 trials     Self-Care   Self-Care  Other Self-Care Comments    Other Self-Care Comments   PT discussed putting shoes on  when sitting versus standing.       Neuro Re-ed    Neuro Re-ed Details   Standing at bottom of steps: tapping bottom step with LLE x 5 with bilateral UE support then x 10 with only right UE support. Pt was cued to pick foot off and not slide off. Catching toe some. Alternating taps x 10 bilateral with UE support, Tapping 2nd step x 10 with LLE. Standing feet together x 30 sec eyes open and eyes closed. Staanding on rocker board x 30 sec maintaining level with manual pertubations then rocking board ant/post x 10 without UE support. Less shift posterior.      Exercises   Exercises  Other Exercises    Other Exercises   PT reviewed HEP. Sit to stands 5 x 2, raising up on toes and back on heels x 10, side stepping along counter x 2 laps.              PT Education - 03/28/20 1145    Education Details  Pt to continue with current HEP. Discussed results of 5 x sit to stand and TUG testing indicating still fall risk and needs to continue to use walker.    Person(s) Educated  Patient;Spouse    Methods  Explanation    Comprehension  Verbalized understanding       PT Short Term Goals - 03/28/20 0950      PT SHORT TERM GOAL #1   Title  Patient will be indepdent with initial HEP    Baseline  Pt performed HEP well in therapy and reports performing at home.    Time  3    Period  Weeks    Status  Achieved    Target Date  03/31/20      PT SHORT TERM GOAL #2   Title  Patient will improve 5x sit <> stand to < 30 seconds w/ UE use to demonstrate improved BLE strength    Baseline  36.06 secs w/ UE, 5 x sit to stand=37 sec from mat with UE on 03/28/20    Time  3    Period  Weeks    Status  Not Met    Target Date  03/31/20      PT SHORT TERM GOAL #3   Title  Patient will improve TUG to <32 seconds for improved functional mobility    Baseline  37.44 sec, TUG=40 on 03/28/20    Time  3    Period  Weeks    Status  Not Met    Target Date  03/31/20        PT Long Term Goals - 03/10/20 1435       PT LONG TERM GOAL #1   Title  Patient will be independent with final HEP    Time  6    Period  Weeks    Status  New    Target Date  04/21/20      PT LONG TERM GOAL #2   Title  Patient will improve TUG time to </= 25 secs with LRAD to reduce risk for falls    Baseline  37.44    Time  6    Period  Weeks    Status  New    Target Date  04/21/20      PT LONG TERM GOAL #3   Title  Pt will improve gait speed to >/=2.0 ft/sec. with LRAD to safely amb. in the community.    Baseline  1.36 ft    Time  6    Period  Weeks    Status  New    Target Date  04/21/20      PT LONG TERM GOAL #4   Title  Pt will improve BERG score to >/=45/56 to decr. falls risk.     Baseline  36/56    Time  6    Period  Weeks    Status  New    Target Date  04/21/20      PT LONG TERM GOAL #5   Title  Pt will amb. 600' over level surfaces with LRAD at MOD I level to demonstrate improved functional mobility.    Time  6    Period  Weeks    Status  New    Target Date  04/21/20      Additional Long Term Goals   Additional Long Term Goals  Yes      PT LONG TERM GOAL #6   Title  Patient will improve 5x sit <> stand to </= 20 seconds to demonstrate improved BLE strength and reduce risk for falls.    Baseline  36.06    Time  6    Period  Weeks    Status  New    Target Date  04/21/20            Plan - 03/28/20 1145    Clinical Impression Statement  PT assessed STGs today. Pt met HEP goal. He was unable to show improvement on 5 x sit to stand or TUG goals however he is showing improving safety with his transfers. He was focusing more on proper form which may have slowed him down some. Pt continues to need some cues to increase left foot clearance with gait as catches more especially as fatigues. Pt will continue to benefit from skilled PT to continue to work on balance, strength and functional mobility.    Personal Factors and Comorbidities  Comorbidity 3+    Comorbidities  Chronic Back Pain, Barrett's  Esophagus, GERD, Hyperglycemia, HTN, CVA, Hyperlipidemia, vision abnormalities, ICH in the right thalamus/posterior limb of internal capsule  Examination-Activity Limitations  Bed Mobility;Transfers;Stairs;Stand    Examination-Participation Restrictions  Yard Work;Community Activity    Stability/Clinical Decision Making  Evolving/Moderate complexity    Rehab Potential  Good    PT Frequency  2x / week    PT Duration  6 weeks    PT Treatment/Interventions  ADLs/Self Care Home Management;Electrical Stimulation;Moist Heat;Cryotherapy;Gait training;Stair training;Functional mobility training;Therapeutic activities;DME Instruction;Therapeutic exercise;Balance training;Neuromuscular re-education;Patient/family education;Orthotic Fit/Training;Passive range of motion;Visual/perceptual remediation/compensation    PT Next Visit Plan  Continue gait and transfer training with Rollator. Focus on improving left foot clearance. Strengthening exercises, Standing Balance.    Consulted and Agree with Plan of Care  Patient;Family member/caregiver    Family Member Consulted  Wife       Patient will benefit from skilled therapeutic intervention in order to improve the following deficits and impairments:  Abnormal gait, Decreased balance, Decreased endurance, Decreased mobility, Difficulty walking, Impaired vision/preception, Decreased knowledge of precautions, Decreased strength, Decreased safety awareness, Decreased knowledge of use of DME, Decreased activity tolerance  Visit Diagnosis: Other abnormalities of gait and mobility  Muscle weakness (generalized)     Problem List Patient Active Problem List   Diagnosis Date Noted  . Depressive reaction   . Sleep disturbance   . Urinary retention   . Bradycardia   . Essential hypertension   . Temperature elevated   . Left thyroid nodule   . Treatment-emergent central sleep apnea 10/25/2018  . 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)  10/25/2018  . Chronic pain syndrome   . Chronic bilateral low back pain   . Benign essential HTN   . Tobacco abuse   . Marijuana abuse   . Hyperlipidemia   . History of CVA with residual deficit   . Dysphagia, post-stroke   . ICH (intracerebral hemorrhage) (HCC) - R thalmic/PLIC d/t HTN 15/40/0867  . Insomnia 07/13/2017  . PAD (peripheral artery disease) ([place]) 07/13/2017  . Hyperlipidemia with target LDL less than 70 07/13/2017  . Stroke-like symptom 09/22/2016  . Paresthesia 09/22/2016  . CVA (cerebral vascular accident) ([place]) 09/22/2016  . Cerebrovascular accident (CVA) due to thrombosis of left carotid artery ([place]) 09/23/2015  . Primary snoring 09/23/2015  . Hypersomnia with sleep apnea 09/23/2015  . Obstructive sleep apnea 09/23/2015  . Lacunar infarct, acute ([place]) 08/25/2015  . Sleep apnea 08/25/2015  . Dysarthria   . CVD (cardiovascular disease) 08/02/2015  . Acute hyperglycemia 08/02/2015  . Systolic hypertension with cerebrovascular disease 12/27/2014  . Epistaxis 07/28/2012  . HYPERGLYCEMIA 10/02/2010  . NEPHROLITHIASIS 05/23/2009  . BARRETTS ESOPHAGUS 02/20/2009  . Gastroparesis 02/20/2009  . RADICULOPATHY 10/21/2008  . GERD 10/08/2008  . ESOPHAGITIS 08/15/2008  . ESOPHAGEAL STRICTURE 08/15/2008  . GASTRITIS, ACUTE 08/15/2008  . RENAL CYST 08/14/2008  . TOBACCO ABUSE 08/08/2008  . HEMATURIA, MICROSCOPIC, HX OF 08/08/2008  . PULMONARY NODULE 01/10/2008    Electa Sniff, PT, DPT, NCS 03/28/2020, 11:48 AM  [facility] [address] [place], [place], [address] Phone: [phone]   Fax:  [phone]  Name: Richard Vaughn MRN: [id number] Date of Birth: 29-Aug-1951

## 2020-03-28 NOTE — Patient Instructions (Signed)
Do all exercises twice each day  Lip exercises:  20 reps for each exercise (25, as each gets easier) 1. Alternate pucker and smile - OOO-EEE  2. Open mouth big Ahh-OOO with mouth open big  3. Pucker and move your lips side to side  4. Puff up your cheeks with air BIG - swish air from side to side  5. Press lips together flat and pop them open  6. Pucker and kiss sound as loud and as long as you can  7. 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  Put Coco out  Gustine wants in  We need bait  I'm going to [place]  We catch drum  ======================================== SAY THESE  - - BIG and LOUD!  Repeat 2 times, 2 times a day  Call the cat "Buttercup"  A calendar of [place], [place]  Four floors to cover  Yellow oil ointment  Fellow lovers of felines  Catastrophe in [place]' plums  The church's chimes chimed  Telling time 'til eleven  Five valve levers  Keep the gate closed  Go see that guy  Fat cows give milk  [employer] Gophers  Fat frogs flip freely  Kohl's into bed  Get that game to [employer]  Thick thistles stick together  Cinnamon aluminum linoleum  Black bugs blood  Lovely lemon linament  Red leather, yellow leather   Big grocery buggy     Purple baby carriage  [facility]  [place] copper coffee pot  Ripe purple cabbage  Three free throws  [employer] tackled   [employer] dipped the dessert   [facility] that [employer] of [employer]  Shirts shrink, shells shouldn't  [place] 49ers  Take the tackle box  File the flash message  Give me five flapjacks  Fundamental relatives  Dye the pets purple  Talking [place] time after time  Dark chocolate chunks  Political landscape of the kingdom  Estate manager/land agent genius  We played yo-yos yesterday

## 2020-03-28 NOTE — Therapy (Signed)
[facility] [address] [place], [place], [address] Phone: [phone]   Fax:  [phone]  Speech Language Pathology Treatment  Patient Details  Name: Richard Vaughn MRN: [id number] Date of Birth: [birth year] Referring Provider (SLP): Reesa Chew (referral), [place], Betty, MD (documentation)   Encounter Date: 03/28/2020  End of Session - 03/28/20 1555    Visit Number  2    Number of Visits  17    Date for SLP Re-Evaluation  06/10/20    SLP Start Time  [id number]    SLP Stop Time   1102    SLP Time Calculation (min)  39 min    Activity Tolerance  Patient tolerated treatment well       Past Medical History:  Diagnosis Date  . Barrett's esophagus   . Chronic back pain   . Gastritis    Mild  . Gastroparesis   . GERD (gastroesophageal reflux disease)   . Headache   . Hyperglycemia   . Hypertension   . Nephrolithiasis   . Renal cyst   . Stricture and stenosis of esophagus   . Stroke ([place]) 07-2015  . Vision abnormalities     Past Surgical History:  Procedure Laterality Date  . [place] SURGERY  2012  . COLONOSCOPY    . [place] SURGERY  2005, 2010   replaced L4 and L5  . UPPER GASTROINTESTINAL ENDOSCOPY      There were no vitals filed for this visit.         ADULT SLP TREATMENT - 03/28/20 1101      General Information   Behavior/Cognition  Alert;Cooperative;Pleasant mood      Treatment Provided   Treatment provided  Cognitive-Linquistic      Cognitive-Linquistic Treatment   Treatment focused on  Dysarthria    Skilled Treatment  SLP reviewed goals developed and pt agreed on these goals. SLP spent entire session re-addressing/reviewing pt's previous dysarthria exercises. SLP had pt imitate each exercise, sentence, and 10 consonant-laden phrases ("tongue twister"). Wife here to observe - SLP strongly encouraged use of mirror for strengthening exercises, and pt req'd usual mod-max cues for  loudness on sentences and for exaggeration of loudness and oral opening and "crisp" articulation for tongue twisters. SLP suggested pt keep track of reps with paper/pencil or on his fingers as SLP shared pt focus is different after his latest CVA.       Assessment / Recommendations / Plan   Plan  Continue with current plan of care   goal/s added for HEP     Progression Toward Goals   Progression toward goals  Progressing toward goals       SLP Education - 03/28/20 1555    Education Details  HEP procedures, may need something to keep track of reps due to incr'd attention deficits    Person(s) Educated  Patient;Spouse    Methods  Explanation;Handout;Demonstration;Verbal cues    Comprehension  Verbalized understanding;Returned demonstration;Verbal cues required;Need further instruction       SLP Short Term Goals - 03/28/20 1556      SLP SHORT TERM GOAL #1   Title  pt will improve emergent awareness with simple functional tasks to requiring min A rarely over three sessions    Time  4    Period  Weeks   or 9 initital sessions for all STGs   Status  On-going      SLP SHORT TERM GOAL #2   Title  pt will demo  compensations for dysarthria in simple sentence responses generating at lest 90% intelligibility over 3 sessions    Time  4    Period  Weeks    Status  On-going      SLP SHORT TERM GOAL #3   Title  pt will demonstrate sustained attention for 15 minutes in mod complex tasks in 3 sessions    Time  4    Period  Weeks    Status  On-going      SLP SHORT TERM GOAL #4   Title  Pt will complete HEP for unintelligible speech with occaisonal mod A for articulation and loudnes    Time  4    Period  Weeks    Status  New       SLP Long Term Goals - 03/28/20 1557      SLP LONG TERM GOAL #1   Title  pt will demo selective attention to complete simple-mod complex functional tasks in mod noisy environment for 10 minutes    Time  8    Period  Weeks   or 17 total sessions for all LTGs    Status  On-going      SLP LONG TERM GOAL #2   Title  pt will demo anticipatory awareness with mod complex tasks by double checkng work 100% of the time over three sessions    Time  8    Period  Weeks    Status  On-going      SLP LONG TERM GOAL #3   Title  pt will demo speech volume in 10 minutes simple-mod complex conversation, in low 70s dB average over three consecutive sessions    Time  8    Period  Weeks    Status  On-going      SLP LONG TERM GOAL #4   Title  pt will produce 10 minutes intelligible simple-mod complex conversation using compensations, with rare min A over 3 consecutive sessions    Time  8    Period  Weeks    Status  On-going      SLP LONG TERM GOAL #5   Title  Pt will complete HEP for unintelligible speech with occaisonal min A for articulation and loudness    Time  8    Period  Weeks    Status  New       Plan - 03/28/20 1556    Clinical Impression Statement  Pt with some mild premorbid dysarthria. He presents today with mild-mod cognitive communication deficits characterized by decr'd attention and awareness, as well as significant dysarthria decreasing pt's speech intelligbility at the sentence and conversation levels.Pt would benefit from skilled ST targeting cognitive linguistics as well as dysarthria.    Speech Therapy Frequency  2x / week    Duration  --   8 weeks or 17 total sessions   Treatment/Interventions  Oral motor exercises;Compensatory techniques;Functional tasks;SLP instruction and feedback;Patient/family education;Internal/external aids;Cueing hierarchy;Cognitive reorganization    Potential to Achieve Goals  Good    Potential Considerations  Severity of impairments       Patient will benefit from skilled therapeutic intervention in order to improve the following deficits and impairments:   Dysarthria and anarthria  Cognitive communication deficit    Problem List Patient Active Problem List   Diagnosis Date Noted  . Depressive  reaction   . Sleep disturbance   . Urinary retention   . Bradycardia   . Essential hypertension   . Temperature elevated   .  Left thyroid nodule   . Treatment-emergent central sleep apnea 10/25/2018  . 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 Chronic pain syndrome   . Chronic bilateral low back pain   . Benign essential HTN   . Tobacco abuse   . Marijuana abuse   . Hyperlipidemia   . History of CVA with residual deficit   . Dysphagia, post-stroke   . ICH (intracerebral hemorrhage) ([facility]) - R thalmic/PLIC d/t HTN [id number]  . Insomnia 07/13/2017  . PAD (peripheral artery disease) ([place]) 07/13/2017  . Hyperlipidemia with target LDL less than 70 07/13/2017  . Stroke-like symptom 09/22/2016  . Paresthesia 09/22/2016  . CVA (cerebral vascular accident) ([place]) 09/22/2016  . Cerebrovascular accident (CVA) due to thrombosis of left carotid artery ([place]) 09/23/2015  . Primary snoring 09/23/2015  . Hypersomnia with sleep apnea 09/23/2015  . Obstructive sleep apnea 09/23/2015  . Lacunar infarct, acute ([place]) 08/25/2015  . Sleep apnea 08/25/2015  . Dysarthria   . CVD (cardiovascular disease) 08/02/2015  . Acute hyperglycemia 08/02/2015  . Systolic hypertension with cerebrovascular disease 12/27/2014  . Epistaxis 07/28/2012  . HYPERGLYCEMIA 10/02/2010  . NEPHROLITHIASIS 05/23/2009  . BARRETTS ESOPHAGUS 02/20/2009  . Gastroparesis 02/20/2009  . RADICULOPATHY 10/21/2008  . GERD 10/08/2008  . ESOPHAGITIS 08/15/2008  . ESOPHAGEAL STRICTURE 08/15/2008  . GASTRITIS, ACUTE 08/15/2008  . RENAL CYST 08/14/2008  . TOBACCO ABUSE 08/08/2008  . HEMATURIA, MICROSCOPIC, HX OF 08/08/2008  . PULMONARY NODULE 01/10/2008    [facility] ,[place], [place]  03/28/2020, 4:00 PM  [facility] [address] [place], [place], [address] Phone: [phone]   Fax:  [phone]   Name: Richard Vaughn MRN:  [id number] Date of Birth: 05-09-1951

## 2020-04-01 ENCOUNTER — Ambulatory Visit: Payer: Medicare Other

## 2020-04-01 ENCOUNTER — Other Ambulatory Visit: Payer: Self-pay

## 2020-04-01 ENCOUNTER — Ambulatory Visit: Payer: Medicare Other | Admitting: Occupational Therapy

## 2020-04-01 DIAGNOSIS — I612 Nontraumatic intracerebral hemorrhage in hemisphere, unspecified: Secondary | ICD-10-CM | POA: Diagnosis not present

## 2020-04-01 DIAGNOSIS — R41841 Cognitive communication deficit: Secondary | ICD-10-CM | POA: Diagnosis not present

## 2020-04-01 DIAGNOSIS — M6281 Muscle weakness (generalized): Secondary | ICD-10-CM | POA: Diagnosis not present

## 2020-04-01 DIAGNOSIS — R471 Dysarthria and anarthria: Secondary | ICD-10-CM | POA: Diagnosis not present

## 2020-04-01 DIAGNOSIS — R2689 Other abnormalities of gait and mobility: Secondary | ICD-10-CM | POA: Diagnosis not present

## 2020-04-01 DIAGNOSIS — R278 Other lack of coordination: Secondary | ICD-10-CM

## 2020-04-01 DIAGNOSIS — I69354 Hemiplegia and hemiparesis following cerebral infarction affecting left non-dominant side: Secondary | ICD-10-CM

## 2020-04-01 DIAGNOSIS — R2681 Unsteadiness on feet: Secondary | ICD-10-CM | POA: Diagnosis not present

## 2020-04-01 NOTE — Therapy (Signed)
[facility] [address] [place], [place], [address] Phone: [phone]   Fax:  [phone]  Occupational Therapy Treatment  Patient Details  Name: Richard Vaughn MRN: [id number] Date of Birth: 07-May-1951 Referring Provider (OT): Reesa Chew, [place]   Encounter Date: 04/01/2020  OT End of Session - 04/01/20 1416    Visit Number  2    Number of Visits  25    Date for OT Re-Evaluation  06/26/20    Authorization Type  MCR, BS/BS    Authorization - Visit Number  2    Authorization - Number of Visits  10    Progress Note Due on Visit  10    OT Start Time  1330   Pt arrived 13 min late   OT Stop Time  1400    OT Time Calculation (min)  30 min    Activity Tolerance  Patient tolerated treatment well    Behavior During Therapy  [facility] for tasks assessed/performed       Past Medical History:  Diagnosis Date  . Barrett's esophagus   . Chronic back pain   . Gastritis    Mild  . Gastroparesis   . GERD (gastroesophageal reflux disease)   . Headache   . Hyperglycemia   . Hypertension   . Nephrolithiasis   . Renal cyst   . Stricture and stenosis of esophagus   . Stroke ([place]) 07-2015  . Vision abnormalities     Past Surgical History:  Procedure Laterality Date  . [place] SURGERY  2012  . COLONOSCOPY    . [place] SURGERY  2005, 2010   replaced L4 and L5  . UPPER GASTROINTESTINAL ENDOSCOPY      There were no vitals filed for this visit.  Subjective Assessment - 04/01/20 1332    Patient is accompanied by:  Family member   wife   Pertinent History  Rt thalamic hemmorhage w/ Lt hemiparesis 02/22/20. PMH: Stroke 2019 w/ Lt hemiparesis, HLD, HTN, GERD, Barretts esophagus    Limitations  fall risk, no driving, Lt shoulder rotator cuff tear (see MRI results 03/14/20)    Patient Stated Goals  Return to normal, driving, baking    Currently in Pain?  Yes   O.T. not addressing directly - outside scope of practice   Pain Score  4     Pain Location  Back    Pain Orientation  Lower         Issued and reviewed coordination HEP - See pt instructions for details. Also discussed O.T. schedule and beginning aquatic therapy week of 04/28/20.                   OT Education - 04/01/20 1355    Education Details  Coordination HEP    Person(s) Educated  Patient;Spouse    Methods  Explanation;Demonstration;Handout    Comprehension  Verbalized understanding;Returned demonstration       OT Short Term Goals - 03/26/20 1457      OT SHORT TERM GOAL #1   Title  Pt will be mod I with HEP for coordination for LUE, balance    Time  6    Period  Weeks    Status  New      OT SHORT TERM GOAL #2   Title  Pt will consistently don socks and tie shoes    Time  6    Period  Weeks    Status  New  OT SHORT TERM GOAL #3   Title  Pt will demonstrate improved coordination as evidenced by decreasing time on 9 hole peg with LUE by at least 10 seconds to assist with functional fine motor tasks.    Baseline  69.06 sec    Time  6    Period  Weeks    Status  New      OT SHORT TERM GOAL #4   Title  Pt will report overall less drops Lt hand during the day attending to Lt side consistently    Time  6    Period  Weeks    Status  New      OT SHORT TERM GOAL #5   Title  Pt will perform dynamic standing tasks w/ supervision without LOB    Time  6    Period  Weeks    Status  New        OT Long Term Goals - 03/26/20 1500      OT LONG TERM GOAL #1   Title  Pt will be independent with updated HEP including aquatic HEP prn    Time  12    Period  Weeks    Status  New      OT LONG TERM GOAL #2   Title  Pt will be mod I with shower transfers    Time  12    Period  Weeks    Status  New      OT LONG TERM GOAL #3   Title  Pt will be mod I with hot meal prep with at least two items.    Time  12    Period  Weeks    Status  New      OT LONG TERM GOAL #4   Title  Pt demonstrate improved  coordination as evidenced by decreasing time on 9 hole peg by at least 20 seconds with LUE.    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) and pay at register    Time  12    Period  Weeks    Status  New      Long Term Additional Goals   Additional Long Term Goals  Yes      OT LONG TERM GOAL #6   Title  Pt will perform environmental scanning during ambulation with simple physical task at 90% or greater accuracy    Time  12    Period  Weeks    Status  New            Plan - 04/01/20 1418    Clinical Impression Statement  Pt demo independence with coordination HEP    Occupational performance deficits (Please refer to evaluation for details):  ADL's;IADL's;Leisure    Body Structure / Function / Physical Skills  ADL;ROM;IADL;Balance;Body mechanics;Mobility;Strength;Coordination;FMC;Pain;UE functional use;Proprioception;Decreased knowledge of use of DME    Rehab Potential  Good    Comorbidities impacting occupational performance description:  Lt rotator cuff tear    OT Frequency  2x / week    OT Duration  12 weeks    OT Treatment/Interventions  Self-care/ADL training;Therapeutic exercise;Functional Mobility Training;Aquatic Therapy;Neuromuscular education;Manual Therapy;Splinting;Energy conservation;Therapeutic activities;DME and/or AE instruction;Cognitive remediation/compensation;Visual/perceptual remediation/compensation;Passive range of motion;Patient/family education;Moist Heat  Plan  Lt shoulder HEP considering rotator cuff tear    Consulted and Agree with Plan of Care  Patient;Family member/caregiver    Family Member Consulted  wife       Patient will benefit from skilled therapeutic intervention in order to improve the following deficits and impairments:   Body Structure / Function / Physical Skills: ADL, ROM, IADL, Balance,  Body mechanics, Mobility, Strength, Coordination, FMC, Pain, UE functional use, Proprioception, Decreased knowledge of use of DME       Visit Diagnosis: Hemiplegia and hemiparesis following cerebral infarction affecting left non-dominant side (HCC)  Other lack of coordination  Muscle weakness (generalized)    Problem List Patient Active Problem List   Diagnosis Date Noted  . Depressive reaction   . Sleep disturbance   . Urinary retention   . Bradycardia   . Essential hypertension   . Temperature elevated   . Left thyroid nodule   . Treatment-emergent central sleep apnea 10/25/2018  . 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 Chronic pain syndrome   . Chronic bilateral low back pain   . Benign essential HTN   . Tobacco abuse   . Marijuana abuse   . Hyperlipidemia   . History of CVA with residual deficit   . Dysphagia, post-stroke   . ICH (intracerebral hemorrhage) (HCC) - R thalmic/PLIC d/t HTN 123456  . Insomnia 07/13/2017  . PAD (peripheral artery disease) ([place]) 07/13/2017  . Hyperlipidemia with target LDL less than 70 07/13/2017  . Stroke-like symptom 09/22/2016  . Paresthesia 09/22/2016  . CVA (cerebral vascular accident) ([place]) 09/22/2016  . Cerebrovascular accident (CVA) due to thrombosis of left carotid artery ([place]) 09/23/2015  . Primary snoring 09/23/2015  . Hypersomnia with sleep apnea 09/23/2015  . Obstructive sleep apnea 09/23/2015  . Lacunar infarct, acute ([place]) 08/25/2015  . Sleep apnea 08/25/2015  . Dysarthria   . CVD (cardiovascular disease) 08/02/2015  . Acute hyperglycemia 08/02/2015  . Systolic hypertension with cerebrovascular disease 12/27/2014  . Epistaxis 07/28/2012  . HYPERGLYCEMIA 10/02/2010  . NEPHROLITHIASIS 05/23/2009  . BARRETTS ESOPHAGUS 02/20/2009  . Gastroparesis 02/20/2009  . RADICULOPATHY 10/21/2008  . GERD 10/08/2008  . ESOPHAGITIS 08/15/2008  . ESOPHAGEAL STRICTURE 08/15/2008  .  GASTRITIS, ACUTE 08/15/2008  . RENAL CYST 08/14/2008  . TOBACCO ABUSE 08/08/2008  . HEMATURIA, MICROSCOPIC, HX OF 08/08/2008  . PULMONARY NODULE 01/10/2008    Carey Bullocks, OTR/L 04/01/2020, 2:20 PM  [facility] [address] [place], [place], [address] Phone: [phone]   Fax:  [phone]  Name: ROBERTJAMES EISEL MRN: [id number] Date of Birth: 1951-03-11

## 2020-04-01 NOTE — Therapy (Signed)
[facility] [address] [place], [place], [address] Phone: [phone]   Fax:  [phone]  Physical Therapy Treatment  Patient Details  Name: Richard Vaughn MRN: [id number] Date of Birth: 26-Aug-1951 Referring Provider (PT): Reesa Chew PA-C   Encounter Date: 04/01/2020  PT End of Session - 04/01/20 1403    Visit Number  7    Number of Visits  13    Date for PT Re-Evaluation  06/08/20   POC for 6 weeks, Cert for 90 days   Authorization Type  Medicare + BCBS (10th visit PN)    Progress Note Due on Visit  10    PT Start Time  1402    PT Stop Time  1443    PT Time Calculation (min)  41 min    Equipment Utilized During Treatment  Gait belt    Activity Tolerance  Patient tolerated treatment well    Behavior During Therapy  WFL for tasks assessed/performed       Past Medical History:  Diagnosis Date  . Barrett's esophagus   . Chronic back pain   . Gastritis    Mild  . Gastroparesis   . GERD (gastroesophageal reflux disease)   . Headache   . Hyperglycemia   . Hypertension   . Nephrolithiasis   . Renal cyst   . Stricture and stenosis of esophagus   . Stroke ([place]) 07-2015  . Vision abnormalities     Past Surgical History:  Procedure Laterality Date  . [place] SURGERY  2012  . COLONOSCOPY    . [place] SURGERY  2005, 2010   replaced L4 and L5  . UPPER GASTROINTESTINAL ENDOSCOPY      There were no vitals filed for this visit.                     [facility] Adult PT Treatment/Exercise - 04/01/20 1406      Ambulation/Gait   Ambulation/Gait  Yes    Ambulation/Gait Assistance  5: Supervision    Ambulation/Gait Assistance Details  PT noted that patient tends to have decreased left foot clearance when he gets distracted.    Ambulation Distance (Feet)  345 Feet    Assistive device  Rollator    Gait Pattern  Step-through pattern;Decreased dorsiflexion - left    Ambulation Surface   Level;Indoor      Neuro Re-ed    Neuro Re-ed Details   In // bars: reciprocal steps over 3 foams x 4 laps down and back, stepping forward and back over foam with LLE working on improving extension when he stepped back as well. Standing left hip extension x 10 with verbal and tactile cues for upright posture. Standing on half foam without UE support 30 sec x 2, step-ups on half foam x 10 with LLE with RUE support and CGA. Dynamic gait activities in hallway 30' x 2 with rollator: head turns left/right, head turns up/down on command. Pt had 2 scuffing on left foot with left/right and 2 with looking up. Stop/go on command, fast/slow on command, marching gait on command. Pt reported some pulling in left upper trap after activities. PT instructed to try to relax shoulders with walking and to perform some shoulder rolls as well to help relax. In // bars: marching gait forward with 1 UE support then backwards steps with verbal cues for upright posture and to try to increase left step length.  PT Education - 04/01/20 1450    Education Details  Pt to continue with current HEP    Person(s) Educated  Patient    Methods  Explanation    Comprehension  Verbalized understanding       PT Short Term Goals - 03/28/20 0950      PT SHORT TERM GOAL #1   Title  Patient will be indepdent with initial HEP    Baseline  Pt performed HEP well in therapy and reports performing at home.    Time  3    Period  Weeks    Status  Achieved    Target Date  03/31/20      PT SHORT TERM GOAL #2   Title  Patient will improve 5x sit <> stand to < 30 seconds w/ UE use to demonstrate improved BLE strength    Baseline  36.06 secs w/ UE, 5 x sit to stand=37 sec from mat with UE on 03/28/20    Time  3    Period  Weeks    Status  Not Met    Target Date  03/31/20      PT SHORT TERM GOAL #3   Title  Patient will improve TUG to <32 seconds for improved functional mobility    Baseline  37.44 sec, TUG=40 on 03/28/20     Time  3    Period  Weeks    Status  Not Met    Target Date  03/31/20        PT Long Term Goals - 03/10/20 1435      PT LONG TERM GOAL #1   Title  Patient will be independent with final HEP    Time  6    Period  Weeks    Status  New    Target Date  04/21/20      PT LONG TERM GOAL #2   Title  Patient will improve TUG time to </= 25 secs with LRAD to reduce risk for falls    Baseline  37.44    Time  6    Period  Weeks    Status  New    Target Date  04/21/20      PT LONG TERM GOAL #3   Title  Pt will improve gait speed to >/=2.0 ft/sec. with LRAD to safely amb. in the community.    Baseline  1.36 ft    Time  6    Period  Weeks    Status  New    Target Date  04/21/20      PT LONG TERM GOAL #4   Title  Pt will improve BERG score to >/=45/56 to decr. falls risk.     Baseline  36/56    Time  6    Period  Weeks    Status  New    Target Date  04/21/20      PT LONG TERM GOAL #5   Title  Pt will amb. 600' over level surfaces with LRAD at MOD I level to demonstrate improved functional mobility.    Time  6    Period  Weeks    Status  New    Target Date  04/21/20      Additional Long Term Goals   Additional Long Term Goals  Yes      PT LONG TERM GOAL #6   Title  Patient will improve 5x sit <> stand to </= 20 seconds to demonstrate improved BLE strength and reduce  risk for falls.    Baseline  36.06    Time  6    Period  Weeks    Status  New    Target Date  04/21/20            Plan - 04/01/20 1451    Clinical Impression Statement  PT focused on trying to work on gait with dynamic tasks to see how he did with left foot clearance. Pt did tend to scuff foot more if was distracted. Pt also having some difficulty with left hip extension with stepping back with left leg.    Personal Factors and Comorbidities  Comorbidity 3+    Comorbidities  Chronic Back Pain, Barrett's Esophagus, GERD, Hyperglycemia, HTN, CVA, Hyperlipidemia, vision abnormalities, ICH in the right  thalamus/posterior limb of internal capsule    Examination-Activity Limitations  Bed Mobility;Transfers;Stairs;Stand    Examination-Participation Restrictions  Yard Work;Community Activity    Stability/Clinical Decision Making  Evolving/Moderate complexity    Rehab Potential  Good    PT Frequency  2x / week    PT Duration  6 weeks    PT Treatment/Interventions  ADLs/Self Care Home Management;Electrical Stimulation;Moist Heat;Cryotherapy;Gait training;Stair training;Functional mobility training;Therapeutic activities;DME Instruction;Therapeutic exercise;Balance training;Neuromuscular re-education;Patient/family education;Orthotic Fit/Training;Passive range of motion;Visual/perceptual remediation/compensation    PT Next Visit Plan  Continue gait and transfer training with Rollator. Focus on improving left foot clearance. Strengthening exercises adding in more left hip extension, Standing Balance.    Consulted and Agree with Plan of Care  Patient;Family member/caregiver    Family Member Consulted  Wife       Patient will benefit from skilled therapeutic intervention in order to improve the following deficits and impairments:  Abnormal gait, Decreased balance, Decreased endurance, Decreased mobility, Difficulty walking, Impaired vision/preception, Decreased knowledge of precautions, Decreased strength, Decreased safety awareness, Decreased knowledge of use of DME, Decreased activity tolerance  Visit Diagnosis: Other abnormalities of gait and mobility  Muscle weakness (generalized)     Problem List Patient Active Problem List   Diagnosis Date Noted  . Depressive reaction   . Sleep disturbance   . Urinary retention   . Bradycardia   . Essential hypertension   . Temperature elevated   . Left thyroid nodule   . Treatment-emergent central sleep apnea 10/25/2018  . 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 Chronic pain syndrome   . Chronic bilateral low  back pain   . Benign essential HTN   . Tobacco abuse   . Marijuana abuse   . Hyperlipidemia   . History of CVA with residual deficit   . Dysphagia, post-stroke   . ICH (intracerebral hemorrhage) (HCC) - R thalmic/PLIC d/t HTN 44/31/5400  . Insomnia 07/13/2017  . PAD (peripheral artery disease) ([place]) 07/13/2017  . Hyperlipidemia with target LDL less than 70 07/13/2017  . Stroke-like symptom 09/22/2016  . Paresthesia 09/22/2016  . CVA (cerebral vascular accident) ([place]) 09/22/2016  . Cerebrovascular accident (CVA) due to thrombosis of left carotid artery ([place]) 09/23/2015  . Primary snoring 09/23/2015  . Hypersomnia with sleep apnea 09/23/2015  . Obstructive sleep apnea 09/23/2015  . Lacunar infarct, acute ([place]) 08/25/2015  . Sleep apnea 08/25/2015  . Dysarthria   . CVD (cardiovascular disease) 08/02/2015  . Acute hyperglycemia 08/02/2015  . Systolic hypertension with cerebrovascular disease 12/27/2014  . Epistaxis 07/28/2012  . HYPERGLYCEMIA 10/02/2010  . NEPHROLITHIASIS 05/23/2009  . BARRETTS ESOPHAGUS 02/20/2009  . Gastroparesis 02/20/2009  . RADICULOPATHY 10/21/2008  . GERD 10/08/2008  . ESOPHAGITIS  08/15/2008  . ESOPHAGEAL STRICTURE 08/15/2008  . GASTRITIS, ACUTE 08/15/2008  . RENAL CYST 08/14/2008  . TOBACCO ABUSE 08/08/2008  . HEMATURIA, MICROSCOPIC, HX OF 08/08/2008  . PULMONARY NODULE 01/10/2008    Electa Sniff, PT, DPT, NCS 04/01/2020, 2:53 PM  [facility] [address] [place], [place], [address] Phone: [phone]   Fax:  [phone]  Name: Richard Vaughn MRN: [id number] Date of Birth: [birth year]

## 2020-04-01 NOTE — Patient Instructions (Addendum)
  Coordination Activities  Perform the following activities for 15 minutes 1-2 times per day with left hand(s).   Rotate ball in fingertips (clockwise and counter-clockwise x 3 revolutions each way).  Toss ball in air and catch with the same hand.  Lay deck of cards on table, flip cards over 1 at a time as fast as you can.  Deal cards with your thumb (Hold deck in hand and push card off top with thumb).  Rotate one card in hand (clockwise and counter-clockwise).  Pick up coins one at a time until you get 5 in your hand, then move coins from palm to fingertips to stack one at a time. Do 3 stacks of 5  Screw together nuts and bolts, then unfasten.

## 2020-04-02 NOTE — Therapy (Signed)
[facility] [address] [place], [place], [address] Phone: [phone]   Fax:  [phone]  Speech Language Pathology Treatment  Patient Details  Name: Richard Vaughn MRN: [id number] Date of Birth: 1951-10-15 Referring Provider (SLP): Reesa Chew (referral), [place], Betty, MD (documentation)   Encounter Date: 04/01/2020  End of Session - 04/02/20 1339    Visit Number  3    Number of Visits  17    Date for SLP Re-Evaluation  06/10/20    SLP Start Time  [id number]    SLP Stop Time   1531    SLP Time Calculation (min)  40 min    Activity Tolerance  Patient tolerated treatment well       Past Medical History:  Diagnosis Date  . Barrett's esophagus   . Chronic back pain   . Gastritis    Mild  . Gastroparesis   . GERD (gastroesophageal reflux disease)   . Headache   . Hyperglycemia   . Hypertension   . Nephrolithiasis   . Renal cyst   . Stricture and stenosis of esophagus   . Stroke ([place]) 07-2015  . Vision abnormalities     Past Surgical History:  Procedure Laterality Date  . [place] SURGERY  2012  . COLONOSCOPY    . [place] SURGERY  2005, 2010   replaced L4 and L5  . UPPER GASTROINTESTINAL ENDOSCOPY      There were no vitals filed for this visit.  Subjective Assessment - 04/01/20 1455    Subjective  Pt acknowledges reduced frequency with HEP.    Patient is accompained by:  Family member    Currently in Pain?  Yes    Pain Score  4     Pain Location  Back    Pain Orientation  Lower    Pain Descriptors / Indicators  Aching    Pain Type  Chronic pain            ADULT SLP TREATMENT - 04/02/20 0001      General Information   Behavior/Cognition  Alert;Cooperative;Pleasant mood      Treatment Provided   Treatment provided  Cognitive-Linquistic      Cognitive-Linquistic Treatment   Treatment focused on  Dysarthria    Skilled Treatment  SLP assisted pt in improving speech  intelligibility by having him say his everyday sentences with overarticlation. Mod cues for overarticulation, occasionally. SLP engaged pt with short phrase (2-3 word) responses focusing on overarticulation and slowed rate. Pt req'd occasional min-mod A for success.      Assessment / Recommendations / Plan   Plan  Continue with current plan of care      Progression Toward Goals   Progression toward goals  Progressing toward goals         SLP Short Term Goals - 04/02/20 1340      SLP SHORT TERM GOAL #1   Title  pt will improve emergent awareness with simple functional tasks to requiring min A rarely over three sessions    Time  4    Period  Weeks   or 9 initital sessions for all STGs   Status  On-going      SLP SHORT TERM GOAL #2   Title  pt will demo compensations for dysarthria in simple sentence responses generating at lest 90% intelligibility over 3 sessions    Time  4    Period  Weeks    Status  On-going  SLP SHORT TERM GOAL #3   Title  pt will demonstrate sustained attention for 15 minutes in mod complex tasks in 3 sessions    Time  4    Period  Weeks    Status  On-going      SLP SHORT TERM GOAL #4   Title  Pt will complete HEP for unintelligible speech with occaisonal mod A for articulation and loudnes    Time  4    Period  Weeks    Status  New       SLP Long Term Goals - 04/02/20 1340      SLP LONG TERM GOAL #1   Title  pt will demo selective attention to complete simple-mod complex functional tasks in mod noisy environment for 10 minutes    Time  8    Period  Weeks   or 17 total sessions for all LTGs   Status  On-going      SLP LONG TERM GOAL #2   Title  pt will demo anticipatory awareness with mod complex tasks by double checkng work 100% of the time over three sessions    Time  8    Period  Weeks    Status  On-going      SLP LONG TERM GOAL #3   Title  pt will demo speech volume in 10 minutes simple-mod complex conversation, in low 70s dB average  over three consecutive sessions    Time  8    Period  Weeks    Status  On-going      SLP LONG TERM GOAL #4   Title  pt will produce 10 minutes intelligible simple-mod complex conversation using compensations, with rare min A over 3 consecutive sessions    Time  8    Period  Weeks    Status  On-going      SLP LONG TERM GOAL #5   Title  Pt will complete HEP for unintelligible speech with occaisonal min A for articulation and loudness    Time  8    Period  Weeks    Status  New       Plan - 04/02/20 1340    Clinical Impression Statement  Pt with some mild premorbid dysarthria. He presents today with mild-mod cognitive communication deficits characterized by decr'd attention and awareness, as well as significant dysarthria decreasing pt's speech intelligbility at the sentence and conversation levels.Pt would benefit from skilled ST targeting cognitive linguistics as well as dysarthria.    Speech Therapy Frequency  2x / week    Duration  --   8 weeks or 17 total sessions   Treatment/Interventions  Oral motor exercises;Compensatory techniques;Functional tasks;SLP instruction and feedback;Patient/family education;Internal/external aids;Cueing hierarchy;Cognitive reorganization    Potential to Achieve Goals  Good    Potential Considerations  Severity of impairments       Patient will benefit from skilled therapeutic intervention in order to improve the following deficits and impairments:   Dysarthria and anarthria  Cognitive communication deficit    Problem List Patient Active Problem List   Diagnosis Date Noted  . Depressive reaction   . Sleep disturbance   . Urinary retention   . Bradycardia   . Essential hypertension   . Temperature elevated   . Left thyroid nodule   . Treatment-emergent central sleep apnea 10/25/2018  . 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 Chronic pain syndrome   . Chronic bilateral low back pain   . Benign  essential HTN   . Tobacco abuse   . Marijuana abuse   . Hyperlipidemia   . History of CVA with residual deficit   . Dysphagia, post-stroke   . ICH (intracerebral hemorrhage) (HCC) - R thalmic/PLIC d/t HTN 123456  . Insomnia 07/13/2017  . PAD (peripheral artery disease) ([place]) 07/13/2017  . Hyperlipidemia with target LDL less than 70 07/13/2017  . Stroke-like symptom 09/22/2016  . Paresthesia 09/22/2016  . CVA (cerebral vascular accident) ([place]) 09/22/2016  . Cerebrovascular accident (CVA) due to thrombosis of left carotid artery ([place]) 09/23/2015  . Primary snoring 09/23/2015  . Hypersomnia with sleep apnea 09/23/2015  . Obstructive sleep apnea 09/23/2015  . Lacunar infarct, acute ([place]) 08/25/2015  . Sleep apnea 08/25/2015  . Dysarthria   . CVD (cardiovascular disease) 08/02/2015  . Acute hyperglycemia 08/02/2015  . Systolic hypertension with cerebrovascular disease 12/27/2014  . Epistaxis 07/28/2012  . HYPERGLYCEMIA 10/02/2010  . NEPHROLITHIASIS 05/23/2009  . BARRETTS ESOPHAGUS 02/20/2009  . Gastroparesis 02/20/2009  . RADICULOPATHY 10/21/2008  . GERD 10/08/2008  . ESOPHAGITIS 08/15/2008  . ESOPHAGEAL STRICTURE 08/15/2008  . GASTRITIS, ACUTE 08/15/2008  . RENAL CYST 08/14/2008  . TOBACCO ABUSE 08/08/2008  . HEMATURIA, MICROSCOPIC, HX OF 08/08/2008  . PULMONARY NODULE 01/10/2008    Jane Lew ,MS, [place]  04/02/2020, 1:40 PM  [facility] [address] [place], [place], [address] Phone: [phone]   Fax:  [phone]   Name: NALEN GRESH MRN: [id number] Date of Birth: 07-05-1951

## 2020-04-03 ENCOUNTER — Encounter: Payer: Self-pay | Admitting: Occupational Therapy

## 2020-04-03 ENCOUNTER — Other Ambulatory Visit: Payer: Self-pay

## 2020-04-03 ENCOUNTER — Ambulatory Visit: Payer: Medicare Other

## 2020-04-03 ENCOUNTER — Ambulatory Visit: Payer: Medicare Other | Admitting: Occupational Therapy

## 2020-04-03 DIAGNOSIS — R2689 Other abnormalities of gait and mobility: Secondary | ICD-10-CM

## 2020-04-03 DIAGNOSIS — R278 Other lack of coordination: Secondary | ICD-10-CM

## 2020-04-03 DIAGNOSIS — R2681 Unsteadiness on feet: Secondary | ICD-10-CM | POA: Diagnosis not present

## 2020-04-03 DIAGNOSIS — R41842 Visuospatial deficit: Secondary | ICD-10-CM

## 2020-04-03 DIAGNOSIS — I69354 Hemiplegia and hemiparesis following cerebral infarction affecting left non-dominant side: Secondary | ICD-10-CM

## 2020-04-03 DIAGNOSIS — R41841 Cognitive communication deficit: Secondary | ICD-10-CM | POA: Diagnosis not present

## 2020-04-03 DIAGNOSIS — M6281 Muscle weakness (generalized): Secondary | ICD-10-CM

## 2020-04-03 DIAGNOSIS — R471 Dysarthria and anarthria: Secondary | ICD-10-CM | POA: Diagnosis not present

## 2020-04-03 DIAGNOSIS — I612 Nontraumatic intracerebral hemorrhage in hemisphere, unspecified: Secondary | ICD-10-CM | POA: Diagnosis not present

## 2020-04-03 NOTE — Therapy (Signed)
[facility] [address] [place], [place], [address] Phone: [phone]   Fax:  [phone]  Occupational Therapy Treatment  Patient Details  Name: Richard Vaughn MRN: [id number] Date of Birth: [birth year] Referring Provider (OT): Reesa Chew, [place]   Encounter Date: 04/03/2020  OT End of Session - 04/03/20 1230    Visit Number  3    Number of Visits  25    Date for OT Re-Evaluation  06/26/20    Authorization Type  MCR, BS/BS    Authorization - Visit Number  3    Authorization - Number of Visits  10    Progress Note Due on Visit  10    OT Start Time  0850    OT Stop Time  0930    OT Time Calculation (min)  40 min    Activity Tolerance  Patient tolerated treatment well    Behavior During Therapy  [facility] for tasks assessed/performed       Past Medical History:  Diagnosis Date  . Barrett's esophagus   . Chronic back pain   . Gastritis    Mild  . Gastroparesis   . GERD (gastroesophageal reflux disease)   . Headache   . Hyperglycemia   . Hypertension   . Nephrolithiasis   . Renal cyst   . Stricture and stenosis of esophagus   . Stroke ([place]) 07-2015  . Vision abnormalities     Past Surgical History:  Procedure Laterality Date  . [place] SURGERY  2012  . COLONOSCOPY    . [place] SURGERY  2005, 2010   replaced L4 and L5  . UPPER GASTROINTESTINAL ENDOSCOPY      There were no vitals filed for this visit.  Subjective Assessment - 04/03/20 1229    Subjective   Pt reports mild Left shoulder pain    Currently in Pain?  Yes    Pain Score  5     Pain Location  Shoulder    Pain Orientation  Left    Pain Descriptors / Indicators  Aching    Pain Type  Acute pain    Pain Onset  More than a month ago    Pain Frequency  Intermittent    Aggravating Factors   malpositioning    Pain Relieving Factors  repositioning                Treatment: supine closed chain chest press and shoulder flexion  with mod v.c and facilitation for proper positioning Seated internal ext rotation and chest press with cane 10 reps each, min-mod v.c, therapsit did not issue as HEP as pt needs review and no family present. Sitting at table pt copied small peg design for fine motor coordination, and visual perceptual skills with LUE min v.c for correct design and for coordination.             OT Short Term Goals - 03/26/20 1457      OT SHORT TERM GOAL #1   Title  Pt will be mod I with HEP for coordination for LUE, balance    Time  6    Period  Weeks    Status  New      OT SHORT TERM GOAL #2   Title  Pt will consistently don socks and tie shoes    Time  6    Period  Weeks    Status  New      OT SHORT TERM GOAL #3  Title  Pt will demonstrate improved coordination as evidenced by decreasing time on 9 hole peg with LUE by at least 10 seconds to assist with functional fine motor tasks.    Baseline  69.06 sec    Time  6    Period  Weeks    Status  New      OT SHORT TERM GOAL #4   Title  Pt will report overall less drops Lt hand during the day attending to Lt side consistently    Time  6    Period  Weeks    Status  New      OT SHORT TERM GOAL #5   Title  Pt will perform dynamic standing tasks w/ supervision without LOB    Time  6    Period  Weeks    Status  New        OT Long Term Goals - 03/26/20 1500      OT LONG TERM GOAL #1   Title  Pt will be independent with updated HEP including aquatic HEP prn    Time  12    Period  Weeks    Status  New      OT LONG TERM GOAL #2   Title  Pt will be mod I with shower transfers    Time  12    Period  Weeks    Status  New      OT LONG TERM GOAL #3   Title  Pt will be mod I with hot meal prep with at least two items.    Time  12    Period  Weeks    Status  New      OT LONG TERM GOAL #4   Title  Pt demonstrate improved coordination as evidenced by decreasing time on 9 hole peg by at least 20 seconds with LUE.    Baseline  69.06  sec    Time  12    Period  Weeks    Status  New      OT LONG TERM GOAL #5   Title  Pt will be demonstate ability to go grocery shopping with wife (this was shared job before) and pay at register    Time  12    Period  Weeks    Status  New      Long Term Additional Goals   Additional Long Term Goals  Yes      OT LONG TERM GOAL #6   Title  Pt will perform environmental scanning during ambulation with simple physical task at 90% or greater accuracy    Time  12    Period  Weeks    Status  New              Patient will benefit from skilled therapeutic intervention in order to improve the following deficits and impairments:           Visit Diagnosis: Hemiplegia and hemiparesis following cerebral infarction affecting left non-dominant side (HCC)  Other lack of coordination  Muscle weakness (generalized)  Other abnormalities of gait and mobility  Visuospatial deficit    Problem List Patient Active Problem List   Diagnosis Date Noted  . Depressive reaction   . Sleep disturbance   . Urinary retention   . Bradycardia   . Essential hypertension   . Temperature elevated   . Left thyroid nodule   . Treatment-emergent central sleep apnea 10/25/2018  . 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  accident (CVA) 10/25/2018  . Chronic pain syndrome   . Chronic bilateral low back pain   . Benign essential HTN   . Tobacco abuse   . Marijuana abuse   . Hyperlipidemia   . History of CVA with residual deficit   . Dysphagia, post-stroke   . ICH (intracerebral hemorrhage) (HCC) - R thalmic/PLIC d/t HTN 123456  . Insomnia 07/13/2017  . PAD (peripheral artery disease) ([place]) 07/13/2017  . Hyperlipidemia with target LDL less than 70 07/13/2017  . Stroke-like symptom 09/22/2016  . Paresthesia 09/22/2016  . CVA (cerebral vascular accident) ([place]) 09/22/2016  . Cerebrovascular accident (CVA) due to thrombosis of left carotid artery ([place]) 09/23/2015  . Primary  snoring 09/23/2015  . Hypersomnia with sleep apnea 09/23/2015  . Obstructive sleep apnea 09/23/2015  . Lacunar infarct, acute ([place]) 08/25/2015  . Sleep apnea 08/25/2015  . Dysarthria   . CVD (cardiovascular disease) 08/02/2015  . Acute hyperglycemia 08/02/2015  . Systolic hypertension with cerebrovascular disease 12/27/2014  . Epistaxis 07/28/2012  . HYPERGLYCEMIA 10/02/2010  . NEPHROLITHIASIS 05/23/2009  . BARRETTS ESOPHAGUS 02/20/2009  . Gastroparesis 02/20/2009  . RADICULOPATHY 10/21/2008  . GERD 10/08/2008  . ESOPHAGITIS 08/15/2008  . ESOPHAGEAL STRICTURE 08/15/2008  . GASTRITIS, ACUTE 08/15/2008  . RENAL CYST 08/14/2008  . TOBACCO ABUSE 08/08/2008  . HEMATURIA, MICROSCOPIC, HX OF 08/08/2008  . PULMONARY NODULE 01/10/2008    Kyleena Scheirer 04/03/2020, 12:31 PM  [name] [address] [place], [place], [address] Phone: [phone]   Fax:  [phone]  Name: Richard Vaughn MRN: [id number] Date of Birth: Oct 22, 1951

## 2020-04-03 NOTE — Therapy (Signed)
[facility] [address] [place], [place], [address] Phone: [phone]   Fax:  [phone]  Physical Therapy Treatment  Patient Details  Name: Richard Vaughn MRN: [id number] Date of Birth: 07/22/1951 Referring Provider (PT): Reesa Chew PA-C   Encounter Date: 04/03/2020  PT End of Session - 04/03/20 0931    Visit Number  8    Number of Visits  13    Date for PT Re-Evaluation  06/08/20   POC for 6 weeks, Cert for 90 days   Authorization Type  Medicare + BCBS (10th visit PN)    Progress Note Due on Visit  10    PT Start Time  0930    PT Stop Time  1015    PT Time Calculation (min)  45 min    Equipment Utilized During Treatment  Gait belt    Activity Tolerance  Patient tolerated treatment well    Behavior During Therapy  [facility] for tasks assessed/performed       Past Medical History:  Diagnosis Date  . Barrett's esophagus   . Chronic back pain   . Gastritis    Mild  . Gastroparesis   . GERD (gastroesophageal reflux disease)   . Headache   . Hyperglycemia   . Hypertension   . Nephrolithiasis   . Renal cyst   . Stricture and stenosis of esophagus   . Stroke ([place]) 07-2015  . Vision abnormalities     Past Surgical History:  Procedure Laterality Date  . [place] SURGERY  2012  . COLONOSCOPY    . [place] SURGERY  2005, 2010   replaced L4 and L5  . UPPER GASTROINTESTINAL ENDOSCOPY      There were no vitals filed for this visit.  Subjective Assessment - 04/03/20 0931    Subjective  Pt just finished OT. Reports shoulder still bothering him.    Patient is accompained by:  Family member   Wife   Pertinent History  Chronic Back Pain, Barrett's Esophagus, GERD, Hyperglycemia, HTN, CVA, Hyperlipidemia, vision abnormalities, ICH in the right thalamus/posterior limb of internal capsule    Limitations  Standing;Walking;House hold activities    Patient Stated Goals  "want to walk", "get back to how I was"     Currently in Pain?  Yes    Pain Score  6     Pain Location  Shoulder    Pain Orientation  Left    Pain Descriptors / Indicators  Aching    Pain Type  Acute pain    Pain Onset  More than a month ago                        [facility] Adult PT Treatment/Exercise - 04/03/20 0933      Ambulation/Gait   Ambulation/Gait  Yes    Ambulation/Gait Assistance  5: Supervision    Ambulation/Gait Assistance Details  Verbal cues to focus on left foot clearance. Noted a couple scuffs when got distracted.    Ambulation Distance (Feet)  345 Feet    Assistive device  Rollator    Gait Pattern  Step-through pattern;Decreased dorsiflexion - left    Ambulation Surface  Level;Indoor      Neuro Re-ed    Neuro Re-ed Details   In // bars: standing on rockerboard positioned ant/posterior trying to maintain level x 30 sec, alternating toe taps on cone with 1 UE support x 10, board positioned lateral trying to maintain level x  30 sec eyes open and then 30 sec x 2 eyes closed CGA. Standing on pillow with eyes open 30 sec x 2 then adding head turns left/right and up/down x 10 each. CGA with patient having to touch at times due to LOB. Corner balance: feet together eyes closed 30 sec x 2, feet together with head turns x 10 each direction. Pt touched wall a couple times.             PT Education - 04/03/20 1258    Education Details  Added corner balance exercises to HEP    Person(s) Educated  Patient;Spouse    Methods  Explanation;Demonstration;Handout    Comprehension  Verbalized understanding;Returned demonstration       PT Short Term Goals - 03/28/20 0950      PT SHORT TERM GOAL #1   Title  Patient will be indepdent with initial HEP    Baseline  Pt performed HEP well in therapy and reports performing at home.    Time  3    Period  Weeks    Status  Achieved    Target Date  03/31/20      PT SHORT TERM GOAL #2   Title  Patient will improve 5x sit <> stand to < 30 seconds w/ UE use to  demonstrate improved BLE strength    Baseline  36.06 secs w/ UE, 5 x sit to stand=37 sec from mat with UE on 03/28/20    Time  3    Period  Weeks    Status  Not Met    Target Date  03/31/20      PT SHORT TERM GOAL #3   Title  Patient will improve TUG to <32 seconds for improved functional mobility    Baseline  37.44 sec, TUG=40 on 03/28/20    Time  3    Period  Weeks    Status  Not Met    Target Date  03/31/20        PT Long Term Goals - 03/10/20 1435      PT LONG TERM GOAL #1   Title  Patient will be independent with final HEP    Time  6    Period  Weeks    Status  New    Target Date  04/21/20      PT LONG TERM GOAL #2   Title  Patient will improve TUG time to </= 25 secs with LRAD to reduce risk for falls    Baseline  37.44    Time  6    Period  Weeks    Status  New    Target Date  04/21/20      PT LONG TERM GOAL #3   Title  Pt will improve gait speed to >/=2.0 ft/sec. with LRAD to safely amb. in the community.    Baseline  1.36 ft    Time  6    Period  Weeks    Status  New    Target Date  04/21/20      PT LONG TERM GOAL #4   Title  Pt will improve BERG score to >/=45/56 to decr. falls risk.     Baseline  36/56    Time  6    Period  Weeks    Status  New    Target Date  04/21/20      PT LONG TERM GOAL #5   Title  Pt will amb. 600' over level surfaces with LRAD at  MOD I level to demonstrate improved functional mobility.    Time  6    Period  Weeks    Status  New    Target Date  04/21/20      Additional Long Term Goals   Additional Long Term Goals  Yes      PT LONG TERM GOAL #6   Title  Patient will improve 5x sit <> stand to </= 20 seconds to demonstrate improved BLE strength and reduce risk for falls.    Baseline  36.06    Time  6    Period  Weeks    Status  New    Target Date  04/21/20            Plan - 04/03/20 1259    Clinical Impression Statement  PT focused on standing balance activities today. Pt challenged on compliant surfaces and  with eyes closed with increased sway and LOB at times.    Personal Factors and Comorbidities  Comorbidity 3+    Comorbidities  Chronic Back Pain, Barrett's Esophagus, GERD, Hyperglycemia, HTN, CVA, Hyperlipidemia, vision abnormalities, ICH in the right thalamus/posterior limb of internal capsule    Examination-Activity Limitations  Bed Mobility;Transfers;Stairs;Stand    Examination-Participation Restrictions  Yard Work;Community Activity    Stability/Clinical Decision Making  Evolving/Moderate complexity    Rehab Potential  Good    PT Frequency  2x / week    PT Duration  6 weeks    PT Treatment/Interventions  ADLs/Self Care Home Management;Electrical Stimulation;Moist Heat;Cryotherapy;Gait training;Stair training;Functional mobility training;Therapeutic activities;DME Instruction;Therapeutic exercise;Balance training;Neuromuscular re-education;Patient/family education;Orthotic Fit/Training;Passive range of motion;Visual/perceptual remediation/compensation    PT Next Visit Plan  How are corner balance exercises going? Continue gait and transfer training with Rollator. Focus on improving left foot clearance. Strengthening exercises adding in more left hip extension, Standing Balance.    Consulted and Agree with Plan of Care  Patient;Family member/caregiver    Family Member Consulted  Wife       Patient will benefit from skilled therapeutic intervention in order to improve the following deficits and impairments:  Abnormal gait, Decreased balance, Decreased endurance, Decreased mobility, Difficulty walking, Impaired vision/preception, Decreased knowledge of precautions, Decreased strength, Decreased safety awareness, Decreased knowledge of use of DME, Decreased activity tolerance  Visit Diagnosis: Other abnormalities of gait and mobility  Muscle weakness (generalized)     Problem List Patient Active Problem List   Diagnosis Date Noted  . Depressive reaction   . Sleep disturbance   .  Urinary retention   . Bradycardia   . Essential hypertension   . Temperature elevated   . Left thyroid nodule   . Treatment-emergent central sleep apnea 10/25/2018  . 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 Chronic pain syndrome   . Chronic bilateral low back pain   . Benign essential HTN   . Tobacco abuse   . Marijuana abuse   . Hyperlipidemia   . History of CVA with residual deficit   . Dysphagia, post-stroke   . ICH (intracerebral hemorrhage) (HCC) - R thalmic/PLIC d/t HTN 24/23/5361  . Insomnia 07/13/2017  . PAD (peripheral artery disease) ([place]) 07/13/2017  . Hyperlipidemia with target LDL less than 70 07/13/2017  . Stroke-like symptom 09/22/2016  . Paresthesia 09/22/2016  . CVA (cerebral vascular accident) ([place]) 09/22/2016  . Cerebrovascular accident (CVA) due to thrombosis of left carotid artery ([place]) 09/23/2015  . Primary snoring 09/23/2015  . Hypersomnia with sleep apnea 09/23/2015  . Obstructive sleep apnea 09/23/2015  .  Lacunar infarct, acute ([place]) 08/25/2015  . Sleep apnea 08/25/2015  . Dysarthria   . CVD (cardiovascular disease) 08/02/2015  . Acute hyperglycemia 08/02/2015  . Systolic hypertension with cerebrovascular disease 12/27/2014  . Epistaxis 07/28/2012  . HYPERGLYCEMIA 10/02/2010  . NEPHROLITHIASIS 05/23/2009  . BARRETTS ESOPHAGUS 02/20/2009  . Gastroparesis 02/20/2009  . RADICULOPATHY 10/21/2008  . GERD 10/08/2008  . ESOPHAGITIS 08/15/2008  . ESOPHAGEAL STRICTURE 08/15/2008  . GASTRITIS, ACUTE 08/15/2008  . RENAL CYST 08/14/2008  . TOBACCO ABUSE 08/08/2008  . HEMATURIA, MICROSCOPIC, HX OF 08/08/2008  . PULMONARY NODULE 01/10/2008    Electa Sniff, PT, DPT, NCS 04/03/2020, 1:00 PM  [facility] [address] [place], [place], [address] Phone: [phone]   Fax:  [phone]  Name: Richard Vaughn MRN: [id number] Date of Birth: [birth year]

## 2020-04-03 NOTE — Patient Instructions (Signed)
Access Code: [email or web address] URL: [email or web address] Date: 03/12/2020 Prepared by: Cherly Anderson  Exercises Sit to Stand - 1 x daily - 7 x weekly - 2 sets - 5 reps Heel Toe Raises with Counter Support - 1 x daily - 5 x weekly - 2 sets - 10 reps Heel Toe Raises with Unilateral Counter Support - 1 x daily - 5 x weekly - 2 sets - 10 reps Side Stepping with Counter Support - 1 x daily - 5 x weekly - 2 sets - 10 reps Romberg Stance with Eyes Closed - 2 x daily - 5 x weekly - 1 sets - 3 reps - 30 sec hold Romberg Stance with Head Nods - 2 x daily - 5 x weekly - 1 sets - 10 reps

## 2020-04-04 ENCOUNTER — Encounter: Payer: Self-pay | Admitting: *Deleted

## 2020-04-04 NOTE — Telephone Encounter (Signed)
Unable to reach patient by phone to follow-up. Sent patient a Therapist, music.

## 2020-04-08 ENCOUNTER — Other Ambulatory Visit: Payer: Self-pay

## 2020-04-08 ENCOUNTER — Ambulatory Visit: Payer: Medicare Other | Admitting: Occupational Therapy

## 2020-04-08 ENCOUNTER — Ambulatory Visit: Payer: Medicare Other

## 2020-04-08 DIAGNOSIS — R2689 Other abnormalities of gait and mobility: Secondary | ICD-10-CM

## 2020-04-08 DIAGNOSIS — R41841 Cognitive communication deficit: Secondary | ICD-10-CM

## 2020-04-08 DIAGNOSIS — R471 Dysarthria and anarthria: Secondary | ICD-10-CM

## 2020-04-08 DIAGNOSIS — M6281 Muscle weakness (generalized): Secondary | ICD-10-CM | POA: Diagnosis not present

## 2020-04-08 DIAGNOSIS — I69354 Hemiplegia and hemiparesis following cerebral infarction affecting left non-dominant side: Secondary | ICD-10-CM

## 2020-04-08 DIAGNOSIS — I612 Nontraumatic intracerebral hemorrhage in hemisphere, unspecified: Secondary | ICD-10-CM | POA: Diagnosis not present

## 2020-04-08 DIAGNOSIS — R2681 Unsteadiness on feet: Secondary | ICD-10-CM | POA: Diagnosis not present

## 2020-04-08 NOTE — Therapy (Signed)
[facility] [address] [place], [place], [address] Phone: [phone]   Fax:  [phone]  Occupational Therapy Treatment  Patient Details  Name: Richard Vaughn MRN: [id number] Date of Birth: 10-26-1951 Referring Provider (OT): Reesa Chew, [place]   Encounter Date: 04/08/2020  OT End of Session - 04/08/20 0934    Visit Number  4    Number of Visits  25    Date for OT Re-Evaluation  06/26/20    Authorization Type  MCR, BS/BS    Authorization - Visit Number  4    Authorization - Number of Visits  10    Progress Note Due on Visit  10    OT Start Time  0850    OT Stop Time  0930    OT Time Calculation (min)  40 min    Activity Tolerance  Patient tolerated treatment well    Behavior During Therapy  [facility] for tasks assessed/performed       Past Medical History:  Diagnosis Date  . Barrett's esophagus   . Chronic back pain   . Gastritis    Mild  . Gastroparesis   . GERD (gastroesophageal reflux disease)   . Headache   . Hyperglycemia   . Hypertension   . Nephrolithiasis   . Renal cyst   . Stricture and stenosis of esophagus   . Stroke ([place]) 07-2015  . Vision abnormalities     Past Surgical History:  Procedure Laterality Date  . [place] SURGERY  2012  . COLONOSCOPY    . [place] SURGERY  2005, 2010   replaced L4 and L5  . UPPER GASTROINTESTINAL ENDOSCOPY      There were no vitals filed for this visit.  Subjective Assessment - 04/08/20 0853    Pertinent History  Rt thalamic hemmorhage w/ Lt hemiparesis 02/22/20. PMH: Stroke 2019 w/ Lt hemiparesis, HLD, HTN, GERD, Barretts esophagus    Limitations  fall risk, no driving, Lt shoulder rotator cuff tear (see MRI results 03/14/20)    Patient Stated Goals  Return to normal, driving, baking    Currently in Pain?  Yes    Pain Score  --   only with abduction d/t rotator cuff tear   Pain Location  Shoulder    Pain Orientation  Left       Pt issued  information for aquatic therapy and reviewed. Also discussed safety with transfers and walker negotiation.  Issued shoulder HEP - see pt instructions for details. Pt able to return demo with cues to slow down and to perform correctly - would benefit from review. UBE x 6 min. Level 1                     OT Education - 04/08/20 0923    Education Details  Shoulder HEP    Person(s) Educated  Patient    Methods  Explanation;Demonstration;Verbal cues;Handout    Comprehension  Verbalized understanding;Returned demonstration;Verbal cues required       OT Short Term Goals - 04/08/20 0934      OT SHORT TERM GOAL #1   Title  Pt will be mod I with HEP for coordination for LUE, balance    Time  6    Period  Weeks    Status  On-going      OT SHORT TERM GOAL #2   Title  Pt will consistently don socks and tie shoes    Time  6  Period  Weeks    Status  New      OT SHORT TERM GOAL #3   Title  Pt will demonstrate improved coordination as evidenced by decreasing time on 9 hole peg with LUE by at least 10 seconds to assist with functional fine motor tasks.    Baseline  69.06 sec    Time  6    Period  Weeks    Status  New      OT SHORT TERM GOAL #4   Title  Pt will report overall less drops Lt hand during the day attending to Lt side consistently    Time  6    Period  Weeks    Status  New      OT SHORT TERM GOAL #5   Title  Pt will perform dynamic standing tasks w/ supervision without LOB    Time  6    Period  Weeks    Status  New        OT Long Term Goals - 03/26/20 1500      OT LONG TERM GOAL #1   Title  Pt will be independent with updated HEP including aquatic HEP prn    Time  12    Period  Weeks    Status  New      OT LONG TERM GOAL #2   Title  Pt will be mod I with shower transfers    Time  12    Period  Weeks    Status  New      OT LONG TERM GOAL #3   Title  Pt will be mod I with hot meal prep with at least two items.    Time  12    Period  Weeks     Status  New      OT LONG TERM GOAL #4   Title  Pt demonstrate improved coordination as evidenced by decreasing time on 9 hole peg by at least 20 seconds with LUE.    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    Occupational performance deficits (Please refer to evaluation for details):  ADL's;IADL's;Leisure    Body Structure / Function / Physical Skills  ADL;ROM;IADL;Balance;Body mechanics;Mobility;Strength;Coordination;FMC;Pain;UE functional use;Proprioception;Decreased knowledge of use of DME    Cognitive Skills  Attention    Comorbidities impacting occupational performance description:  Lt rotator cuff tear    OT Frequency  2x / week    OT Duration  12 weeks    OT Treatment/Interventions  Self-care/ADL training;Therapeutic exercise;Functional Mobility Training;Aquatic Therapy;Neuromuscular education;Manual Therapy;Splinting;Energy  conservation;Therapeutic activities;DME and/or AE instruction;Cognitive remediation/compensation;Visual/perceptual remediation/compensation;Passive range of motion;Patient/family education;Moist Heat    Plan  continue coordination, review shoulder HEP prn, functional balance/dynamic standing    Consulted and Agree with Plan of Care  Patient;Family member/caregiver    Family Member Consulted  wife       Patient will benefit from skilled therapeutic intervention in order  to improve the following deficits and impairments:   Body Structure / Function / Physical Skills: ADL, ROM, IADL, Balance, Body mechanics, Mobility, Strength, Coordination, FMC, Pain, UE functional use, Proprioception, Decreased knowledge of use of DME Cognitive Skills: Attention     Visit Diagnosis: Hemiplegia and hemiparesis following cerebral infarction affecting left non-dominant side (HCC)  Muscle weakness (generalized)    Problem List Patient Active Problem List   Diagnosis Date Noted  . Depressive reaction   . Sleep disturbance   . Urinary retention   . Bradycardia   . Essential hypertension   . Temperature elevated   . Left thyroid nodule   . Treatment-emergent central sleep apnea 10/25/2018  . 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 Chronic pain syndrome   . Chronic bilateral low back pain   . Benign essential HTN   . Tobacco abuse   . Marijuana abuse   . Hyperlipidemia   . History of CVA with residual deficit   . Dysphagia, post-stroke   . ICH (intracerebral hemorrhage) (HCC) - R thalmic/PLIC d/t HTN 123456  . Insomnia 07/13/2017  . PAD (peripheral artery disease) ([place]) 07/13/2017  . Hyperlipidemia with target LDL less than 70 07/13/2017  . Stroke-like symptom 09/22/2016  . Paresthesia 09/22/2016  . CVA (cerebral vascular accident) ([place]) 09/22/2016  . Cerebrovascular accident (CVA) due to thrombosis of left carotid artery ([place]) 09/23/2015  . Primary snoring 09/23/2015  . Hypersomnia with sleep apnea 09/23/2015  . Obstructive sleep apnea 09/23/2015  . Lacunar infarct, acute ([place]) 08/25/2015  . Sleep apnea 08/25/2015  . Dysarthria   . CVD (cardiovascular disease) 08/02/2015  . Acute hyperglycemia 08/02/2015  . Systolic hypertension with cerebrovascular disease 12/27/2014  . Epistaxis 07/28/2012  . HYPERGLYCEMIA 10/02/2010  . NEPHROLITHIASIS 05/23/2009  . BARRETTS ESOPHAGUS 02/20/2009  . Gastroparesis 02/20/2009   . RADICULOPATHY 10/21/2008  . GERD 10/08/2008  . ESOPHAGITIS 08/15/2008  . ESOPHAGEAL STRICTURE 08/15/2008  . GASTRITIS, ACUTE 08/15/2008  . RENAL CYST 08/14/2008  . TOBACCO ABUSE 08/08/2008  . HEMATURIA, MICROSCOPIC, HX OF 08/08/2008  . PULMONARY NODULE 01/10/2008    Carey Bullocks, OTR/L 04/08/2020, 9:36 AM  [facility] [address] [place], [place], [address] Phone: [phone]   Fax:  [phone]  Name: YOHANN SCHOENIG MRN: [id number] Date of Birth: 02-May-1951

## 2020-04-08 NOTE — Patient Instructions (Signed)
1. Cranial Flexion: Overhead Arm Extension - Supine (Medicine Diona Foley)    Lie with knees bent, arms beyond head, holding _shoebox or paper towel roll. Pull up to above face, keeping elbows straight.  Repeat _10___ times per set. Do _2-3__ sets per session.   2. (Clinic) Retraction: Row - Bilateral ([place])    Facing pulley, arms reaching forward, pull hands toward stomach, pinching shoulder blades together. Repeat __10__ times per set. Do __1__ sets per session. Do _2-3___ sessions per day. Use yellow theraband   3. Sitting up - hold cane or stick with both palms up, elbows bent and glued to side, move forearms side to side x 10 reps each way. Do 2-3 sessions per day

## 2020-04-08 NOTE — Therapy (Signed)
[facility] [address] [place], [place], [address] Phone: [phone]   Fax:  [phone]  Physical Therapy Treatment  Patient Details  Name: Richard Vaughn MRN: [id number] Date of Birth: August 08, 1951 Referring Provider (PT): Reesa Chew PA-C   Encounter Date: 04/08/2020  PT End of Session - 04/08/20 8022    Visit Number  9    Number of Visits  13    Date for PT Re-Evaluation  06/08/20   POC for 6 weeks, Cert for 90 days   Authorization Type  Medicare + [employer]BS (10th visit PN)    Progress Note Due on Visit  10    PT Start Time  0933    PT Stop Time  1014    PT Time Calculation (min)  41 min    Equipment Utilized During Treatment  Gait belt    Activity Tolerance  Patient tolerated treatment well    Behavior During Therapy  [facility] for tasks assessed/performed       Past Medical History:  Diagnosis Date  . Barrett's esophagus   . Chronic back pain   . Gastritis    Mild  . Gastroparesis   . GERD (gastroesophageal reflux disease)   . Headache   . Hyperglycemia   . Hypertension   . Nephrolithiasis   . Renal cyst   . Stricture and stenosis of esophagus   . Stroke ([place]) 07-2015  . Vision abnormalities     Past Surgical History:  Procedure Laterality Date  . [place] SURGERY  2012  . COLONOSCOPY    . [place] SURGERY  2005, 2010   replaced L4 and L5  . UPPER GASTROINTESTINAL ENDOSCOPY      There were no vitals filed for this visit.  Subjective Assessment - 04/08/20 0936    Subjective  Pt reports he is doing well. Did some more walking at park this weekend and went well.    Patient is accompained by:  Family member   Wife   Pertinent History  Chronic Back Pain, Barrett's Esophagus, GERD, Hyperglycemia, HTN, CVA, Hyperlipidemia, vision abnormalities, ICH in the right thalamus/posterior limb of internal capsule    Limitations  Standing;Walking;House hold activities    Patient Stated Goals  "want to  walk", "get back to how I was"    Currently in Pain?  Yes    Pain Score  3     Pain Location  Back    Pain Orientation  Lower    Pain Descriptors / Indicators  Aching    Pain Onset  More than a month ago                        [facility] Adult PT Treatment/Exercise - 04/08/20 0937      Transfers   Transfers  Sit to Stand;Stand to Sit    Sit to Stand  5: Supervision    Stand to Sit  5: Supervision    Comments  Pt performed sit to stand from mat with first rep pulling on walker and legs not back enough causing him to lose balance posterior. Corrected on 2nd rep. Pt performed sit to stand from rollator supervision/CGA. Verbal cues to come straight up and not push back on walker. Pt performed sit to stand x 5 from edge of mat supervision      Ambulation/Gait   Ambulation/Gait  Yes    Ambulation/Gait Assistance  5: Supervision    Ambulation/Gait Assistance Details  Pt was able to demonstrate improved left foot clearance only catching a couple times minimally even with distractions    Ambulation Distance (Feet)  345 Feet    Assistive device  Rollator    Gait Pattern  Step-through pattern;Decreased dorsiflexion - left    Ambulation Surface  Level;Indoor    Gait Comments  Pt ambulated 115' with SPC min assist with decreased step length and verbal cuing for sequence.      Neuro Re-ed    Neuro Re-ed Details   Standing on airex feet apart: eyes open x 30 sec then x 30 sec eyes closed with increased sway CGA, head turns left/right x 10 and up/down x 10. On airex feet together x 30 sec eyes open and eyes closed with increased sway then head turns left/right x 10. Staggered stance x 30 sec each position on airex.  Gait in // bars with 1 UE support x 2 laps, marching gait in // bars with 1 UE support x 2 laps, Sidestepping x 2 laps with cues to increase step length, backwards gait and fowards march with cues to increase left step. Stepping over and back foam beam with left leg x 10 with cues  to bring left leg back past right.             PT Education - 04/08/20 1125    Education Details  Pt to continue with current HEP    Person(s) Educated  Patient    Methods  Explanation    Comprehension  Verbalized understanding       PT Short Term Goals - 03/28/20 0950      PT SHORT TERM GOAL #1   Title  Patient will be indepdent with initial HEP    Baseline  Pt performed HEP well in therapy and reports performing at home.    Time  3    Period  Weeks    Status  Achieved    Target Date  03/31/20      PT SHORT TERM GOAL #2   Title  Patient will improve 5x sit <> stand to < 30 seconds w/ UE use to demonstrate improved BLE strength    Baseline  36.06 secs w/ UE, 5 x sit to stand=37 sec from mat with UE on 03/28/20    Time  3    Period  Weeks    Status  Not Met    Target Date  03/31/20      PT SHORT TERM GOAL #3   Title  Patient will improve TUG to <32 seconds for improved functional mobility    Baseline  37.44 sec, TUG=40 on 03/28/20    Time  3    Period  Weeks    Status  Not Met    Target Date  03/31/20        PT Long Term Goals - 03/10/20 1435      PT LONG TERM GOAL #1   Title  Patient will be independent with final HEP    Time  6    Period  Weeks    Status  New    Target Date  04/21/20      PT LONG TERM GOAL #2   Title  Patient will improve TUG time to </= 25 secs with LRAD to reduce risk for falls    Baseline  37.44    Time  6    Period  Weeks    Status  New    Target Date  04/21/20  PT LONG TERM GOAL #3   Title  Pt will improve gait speed to >/=2.0 ft/sec. with LRAD to safely amb. in the community.    Baseline  1.36 ft    Time  6    Period  Weeks    Status  New    Target Date  04/21/20      PT LONG TERM GOAL #4   Title  Pt will improve BERG score to >/=45/56 to decr. falls risk.     Baseline  36/56    Time  6    Period  Weeks    Status  New    Target Date  04/21/20      PT LONG TERM GOAL #5   Title  Pt will amb. 600' over level  surfaces with LRAD at MOD I level to demonstrate improved functional mobility.    Time  6    Period  Weeks    Status  New    Target Date  04/21/20      Additional Long Term Goals   Additional Long Term Goals  Yes      PT LONG TERM GOAL #6   Title  Patient will improve 5x sit <> stand to </= 20 seconds to demonstrate improved BLE strength and reduce risk for falls.    Baseline  36.06    Time  6    Period  Weeks    Status  New    Target Date  04/21/20            Plan - 04/08/20 1125    Clinical Impression Statement  Pt was doing better with left foot clearance with rollator today. Did trial [facility] but patient unsteady and had more difficulty having to think about sequence a lot. Pt continues to have most difficulty on compliant surfaces with eyes closed.    Personal Factors and Comorbidities  Comorbidity 3+    Comorbidities  Chronic Back Pain, Barrett's Esophagus, GERD, Hyperglycemia, HTN, CVA, Hyperlipidemia, vision abnormalities, ICH in the right thalamus/posterior limb of internal capsule    Examination-Activity Limitations  Bed Mobility;Transfers;Stairs;Stand    Examination-Participation Restrictions  Yard Work;Community Activity    Stability/Clinical Decision Making  Evolving/Moderate complexity    Rehab Potential  Good    PT Frequency  2x / week    PT Duration  6 weeks    PT Treatment/Interventions  ADLs/Self Care Home Management;Electrical Stimulation;Moist Heat;Cryotherapy;Gait training;Stair training;Functional mobility training;Therapeutic activities;DME Instruction;Therapeutic exercise;Balance training;Neuromuscular re-education;Patient/family education;Orthotic Fit/Training;Passive range of motion;Visual/perceptual remediation/compensation    PT Next Visit Plan  10th visit progress note next visit. How are corner balance exercises going? Continue gait and transfer training with Rollator. Focus on improving left foot clearance. Strengthening exercises adding in more left hip  extension, Standing Balance.    Consulted and Agree with Plan of Care  Patient;Family member/caregiver    Family Member Consulted  Wife       Patient will benefit from skilled therapeutic intervention in order to improve the following deficits and impairments:  Abnormal gait, Decreased balance, Decreased endurance, Decreased mobility, Difficulty walking, Impaired vision/preception, Decreased knowledge of precautions, Decreased strength, Decreased safety awareness, Decreased knowledge of use of DME, Decreased activity tolerance  Visit Diagnosis: Other abnormalities of gait and mobility  Muscle weakness (generalized)     Problem List Patient Active Problem List   Diagnosis Date Noted  . Depressive reaction   . Sleep disturbance   . Urinary retention   . Bradycardia   . Essential hypertension   .  Temperature elevated   . Left thyroid nodule   . Treatment-emergent central sleep apnea 10/25/2018  . 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 Chronic pain syndrome   . Chronic bilateral low back pain   . Benign essential HTN   . Tobacco abuse   . Marijuana abuse   . Hyperlipidemia   . History of CVA with residual deficit   . Dysphagia, post-stroke   . ICH (intracerebral hemorrhage) (HCC) - R thalmic/PLIC d/t HTN 45/85/9292  . Insomnia 07/13/2017  . PAD (peripheral artery disease) ([place]) 07/13/2017  . Hyperlipidemia with target LDL less than 70 07/13/2017  . Stroke-like symptom 09/22/2016  . Paresthesia 09/22/2016  . CVA (cerebral vascular accident) ([place]) 09/22/2016  . Cerebrovascular accident (CVA) due to thrombosis of left carotid artery ([place]) 09/23/2015  . Primary snoring 09/23/2015  . Hypersomnia with sleep apnea 09/23/2015  . Obstructive sleep apnea 09/23/2015  . Lacunar infarct, acute ([place]) 08/25/2015  . Sleep apnea 08/25/2015  . Dysarthria   . CVD (cardiovascular disease) 08/02/2015  . Acute hyperglycemia 08/02/2015  . Systolic  hypertension with cerebrovascular disease 12/27/2014  . Epistaxis 07/28/2012  . HYPERGLYCEMIA 10/02/2010  . NEPHROLITHIASIS 05/23/2009  . BARRETTS ESOPHAGUS 02/20/2009  . Gastroparesis 02/20/2009  . RADICULOPATHY 10/21/2008  . GERD 10/08/2008  . ESOPHAGITIS 08/15/2008  . ESOPHAGEAL STRICTURE 08/15/2008  . GASTRITIS, ACUTE 08/15/2008  . RENAL CYST 08/14/2008  . TOBACCO ABUSE 08/08/2008  . HEMATURIA, MICROSCOPIC, HX OF 08/08/2008  . PULMONARY NODULE 01/10/2008    Electa Sniff, PT, DPT, NCS 04/08/2020, 11:28 AM  [facility] [address] [place], [place], [address] Phone: [phone]   Fax:  [phone]  Name: Richard Vaughn MRN: [id number] Date of Birth: Oct 20, 1951

## 2020-04-08 NOTE — Therapy (Signed)
[name] [address] [place], [place], [address] Phone: [phone]   Fax:  [phone]  Speech Language Pathology Treatment  Patient Details  Name: Richard Vaughn MRN: [id number] Date of Birth: 02-14-1951 Referring Provider (SLP): Reesa Chew (referral), [place], Betty, MD (documentation)   Encounter Date: 04/08/2020  End of Session - 04/08/20 1330    Visit Number  4    Number of Visits  17    Date for SLP Re-Evaluation  06/10/20    SLP Start Time  81    SLP Stop Time   1100    SLP Time Calculation (min)  41 min    Activity Tolerance  Patient tolerated treatment well       Past Medical History:  Diagnosis Date  . Barrett's esophagus   . Chronic back pain   . Gastritis    Mild  . Gastroparesis   . GERD (gastroesophageal reflux disease)   . Headache   . Hyperglycemia   . Hypertension   . Nephrolithiasis   . Renal cyst   . Stricture and stenosis of esophagus   . Stroke ([place]) 07-2015  . Vision abnormalities     Past Surgical History:  Procedure Laterality Date  . [place] SURGERY  2012  . COLONOSCOPY    . [place] SURGERY  2005, 2010   replaced L4 and L5  . UPPER GASTROINTESTINAL ENDOSCOPY      There were no vitals filed for this visit.  Subjective Assessment - 04/08/20 1018    Subjective  (wife) "Even in the park walking we practiced them."    Patient is accompained by:  Family member   wife   Currently in Pain?  Yes    Pain Score  3     Pain Location  Back    Pain Orientation  Lower    Pain Descriptors / Indicators  Aching    Pain Type  Acute pain    Pain Onset  More than a month ago    Pain Frequency  Intermittent    Aggravating Factors   malpositioning    Pain Relieving Factors  repositioning            ADULT SLP TREATMENT - 04/08/20 1023      General Information   Behavior/Cognition  Alert;Cooperative;Pleasant mood      Treatment Provided   Treatment provided   Cognitive-Linquistic      Cognitive-Linquistic Treatment   Treatment focused on  Dysarthria    Skilled Treatment  In word level speech compensation tasks pt req'd initlal mod cues usually, faded to independent. In phrase level tasks pt req'd rare min A occasionally. Some intermittent carryover to spontaneous speech observed. OF note:  SLP chose tasks where some higher level cognition necessary and pt req'd repeats of stimuli apporx 10% of the time.       Assessment / Recommendations / Plan   Plan  Continue with current plan of care      Progression Toward Goals   Progression toward goals  Progressing toward goals         SLP Short Term Goals - 04/08/20 1331      SLP SHORT TERM GOAL #1   Title  pt will improve emergent awareness with simple functional tasks to requiring min A rarely over three sessions    Time  3    Period  Weeks   or 9 initital sessions for all STGs   Status  On-going  SLP SHORT TERM GOAL #2   Title  pt will demo compensations for dysarthria in simple sentence responses generating at lest 90% intelligibility over 3 sessions    Time  3    Period  Weeks    Status  On-going      SLP SHORT TERM GOAL #3   Title  pt will demonstrate sustained attention for 15 minutes in mod complex tasks in 3 sessions    Time  3    Period  Weeks    Status  On-going      SLP SHORT TERM GOAL #4   Title  Pt will complete HEP for unintelligible speech with occaisonal mod A for articulation and loudnes    Time  3    Period  Weeks    Status  On-going       SLP Long Term Goals - 04/08/20 1331      SLP LONG TERM GOAL #1   Title  pt will demo selective attention to complete simple-mod complex functional tasks in mod noisy environment for 10 minutes    Time  7    Period  Weeks   or 17 total sessions for all LTGs   Status  On-going      SLP LONG TERM GOAL #2   Title  pt will demo anticipatory awareness with mod complex tasks by double checkng work 100% of the time over three  sessions    Time  7    Period  Weeks    Status  On-going      SLP LONG TERM GOAL #3   Title  pt will demo speech volume in 10 minutes simple-mod complex conversation, in low 70s dB average over three consecutive sessions    Time  7    Period  Weeks    Status  On-going      SLP LONG TERM GOAL #4   Title  pt will produce 10 minutes intelligible simple-mod complex conversation using compensations, with rare min A over 3 consecutive sessions    Time  7    Period  Weeks    Status  On-going      SLP LONG TERM GOAL #5   Title  Pt will complete HEP for unintelligible speech with occaisonal min A for articulation and loudness    Time  7    Period  Weeks    Status  New       Plan - 04/08/20 1331    Clinical Impression Statement  Pt with some mild premorbid dysarthria. He presents today with mild-mod cognitive communication deficits characterized by decr'd attention and awareness, as well as significant dysarthria decreasing pt's speech intelligbility at the sentence and conversation levels.Pt would benefit from skilled ST targeting cognitive linguistics as well as dysarthria.    Speech Therapy Frequency  2x / week    Duration  --   8 weeks or 17 total sessions   Treatment/Interventions  Oral motor exercises;Compensatory techniques;Functional tasks;SLP instruction and feedback;Patient/family education;Internal/external aids;Cueing hierarchy;Cognitive reorganization    Potential to Achieve Goals  Good    Potential Considerations  Severity of impairments       Patient will benefit from skilled therapeutic intervention in order to improve the following deficits and impairments:   Dysarthria and anarthria  Cognitive communication deficit    Problem List Patient Active Problem List   Diagnosis Date Noted  . Depressive reaction   . Sleep disturbance   . Urinary retention   . Bradycardia   .  Essential hypertension   . Temperature elevated   . Left thyroid nodule   .  Treatment-emergent central sleep apnea 10/25/2018  . 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 Chronic pain syndrome   . Chronic bilateral low back pain   . Benign essential HTN   . Tobacco abuse   . Marijuana abuse   . Hyperlipidemia   . History of CVA with residual deficit   . Dysphagia, post-stroke   . ICH (intracerebral hemorrhage) (HCC) - R thalmic/PLIC d/t HTN 123456  . Insomnia 07/13/2017  . PAD (peripheral artery disease) ([place]) 07/13/2017  . Hyperlipidemia with target LDL less than 70 07/13/2017  . Stroke-like symptom 09/22/2016  . Paresthesia 09/22/2016  . CVA (cerebral vascular accident) ([place]) 09/22/2016  . Cerebrovascular accident (CVA) due to thrombosis of left carotid artery ([place]) 09/23/2015  . Primary snoring 09/23/2015  . Hypersomnia with sleep apnea 09/23/2015  . Obstructive sleep apnea 09/23/2015  . Lacunar infarct, acute ([place]) 08/25/2015  . Sleep apnea 08/25/2015  . Dysarthria   . CVD (cardiovascular disease) 08/02/2015  . Acute hyperglycemia 08/02/2015  . Systolic hypertension with cerebrovascular disease 12/27/2014  . Epistaxis 07/28/2012  . HYPERGLYCEMIA 10/02/2010  . NEPHROLITHIASIS 05/23/2009  . BARRETTS ESOPHAGUS 02/20/2009  . Gastroparesis 02/20/2009  . RADICULOPATHY 10/21/2008  . GERD 10/08/2008  . ESOPHAGITIS 08/15/2008  . ESOPHAGEAL STRICTURE 08/15/2008  . GASTRITIS, ACUTE 08/15/2008  . RENAL CYST 08/14/2008  . TOBACCO ABUSE 08/08/2008  . HEMATURIA, MICROSCOPIC, HX OF 08/08/2008  . PULMONARY NODULE 01/10/2008    [facility] ,[place], [place]  04/08/2020, 1:32 PM  [facility] [address] [place], [place], [address] Phone: [phone]   Fax:  [phone]   Name: BRANKO ALLOR MRN: [id number] Date of Birth: Aug 03, 1951

## 2020-04-08 NOTE — Patient Instructions (Signed)
  Please complete the assigned speech therapy homework prior to your next session and return it to the speech therapist at your next visit.  

## 2020-04-10 ENCOUNTER — Ambulatory Visit: Payer: Medicare Other

## 2020-04-10 ENCOUNTER — Ambulatory Visit: Payer: Medicare Other | Admitting: Occupational Therapy

## 2020-04-11 ENCOUNTER — Encounter: Payer: Federal, State, Local not specified - PPO | Admitting: Physical Medicine and Rehabilitation

## 2020-04-12 ENCOUNTER — Other Ambulatory Visit: Payer: Self-pay | Admitting: Family Medicine

## 2020-04-12 DIAGNOSIS — G4700 Insomnia, unspecified: Secondary | ICD-10-CM

## 2020-04-15 ENCOUNTER — Ambulatory Visit: Payer: Federal, State, Local not specified - PPO

## 2020-04-15 ENCOUNTER — Ambulatory Visit: Payer: Medicare Other | Attending: Family Medicine | Admitting: Speech Pathology

## 2020-04-15 ENCOUNTER — Other Ambulatory Visit: Payer: Self-pay

## 2020-04-15 ENCOUNTER — Ambulatory Visit: Payer: Medicare Other | Admitting: Occupational Therapy

## 2020-04-15 DIAGNOSIS — I612 Nontraumatic intracerebral hemorrhage in hemisphere, unspecified: Secondary | ICD-10-CM | POA: Diagnosis not present

## 2020-04-15 DIAGNOSIS — R293 Abnormal posture: Secondary | ICD-10-CM | POA: Diagnosis not present

## 2020-04-15 DIAGNOSIS — M25512 Pain in left shoulder: Secondary | ICD-10-CM | POA: Diagnosis not present

## 2020-04-15 DIAGNOSIS — R41841 Cognitive communication deficit: Secondary | ICD-10-CM | POA: Diagnosis not present

## 2020-04-15 DIAGNOSIS — R41842 Visuospatial deficit: Secondary | ICD-10-CM | POA: Insufficient documentation

## 2020-04-15 DIAGNOSIS — G8929 Other chronic pain: Secondary | ICD-10-CM | POA: Insufficient documentation

## 2020-04-15 DIAGNOSIS — R278 Other lack of coordination: Secondary | ICD-10-CM

## 2020-04-15 DIAGNOSIS — R2681 Unsteadiness on feet: Secondary | ICD-10-CM | POA: Insufficient documentation

## 2020-04-15 DIAGNOSIS — R2689 Other abnormalities of gait and mobility: Secondary | ICD-10-CM

## 2020-04-15 DIAGNOSIS — R471 Dysarthria and anarthria: Secondary | ICD-10-CM | POA: Insufficient documentation

## 2020-04-15 DIAGNOSIS — I69354 Hemiplegia and hemiparesis following cerebral infarction affecting left non-dominant side: Secondary | ICD-10-CM | POA: Insufficient documentation

## 2020-04-15 DIAGNOSIS — M6281 Muscle weakness (generalized): Secondary | ICD-10-CM | POA: Diagnosis not present

## 2020-04-15 NOTE — Therapy (Signed)
[facility] [address] [place], [place], [address] Phone: [phone]   Fax:  [phone]  Occupational Therapy Treatment  Patient Details  Name: Richard Vaughn MRN: [id number] Date of Birth: [birth year] Referring Provider (OT): Reesa Chew, [place]   Encounter Date: 04/15/2020  OT End of Session - 04/15/20 1048    Visit Number  5    Number of Visits  25    Date for OT Re-Evaluation  06/26/20    Authorization Type  MCR, BS/BS    Authorization - Visit Number  5    Authorization - Number of Visits  10    Progress Note Due on Visit  10    OT Start Time  1019    OT Stop Time  1100    OT Time Calculation (min)  41 min    Activity Tolerance  Patient tolerated treatment well    Behavior During Therapy  [facility] for tasks assessed/performed       Past Medical History:  Diagnosis Date  . Barrett's esophagus   . Chronic back pain   . Gastritis    Mild  . Gastroparesis   . GERD (gastroesophageal reflux disease)   . Headache   . Hyperglycemia   . Hypertension   . Nephrolithiasis   . Renal cyst   . Stricture and stenosis of esophagus   . Stroke ([place]) 07-2015  . Vision abnormalities     Past Surgical History:  Procedure Laterality Date  . [place] SURGERY  2012  . COLONOSCOPY    . [place] SURGERY  2005, 2010   replaced L4 and L5  . UPPER GASTROINTESTINAL ENDOSCOPY      There were no vitals filed for this visit.  Subjective Assessment - 04/16/20 1251    Subjective   Pt reports back pain    Currently in Pain?  Yes    Pain Score  4     Pain Location  Back    Pain Descriptors / Indicators  Aching    Pain Type  Chronic pain    Pain Onset  More than a month ago    Pain Frequency  Intermittent    Aggravating Factors   malpositioning    Pain Relieving Factors  repostioning               Treatment:Reveiwed previously issued HEP with pt/ wife. Low range functional reaching with fine motor  coordination to copy small peg design on vertical pegboard with LUE, mod difficulty/ drops, min v.c for performance, increased time required.             OT Education - 04/15/20 1047    Education Details  Shoulder HEP review, 10-15 reps min v.c, stacking and manipulating coins in hand for increased fine motor coordination, min difficulty/ drops   Person(s) Educated  Patient;Spouse    Methods  Explanation;Demonstration;Verbal cues;Handout    Comprehension  Verbalized understanding;Returned demonstration;Verbal cues required       OT Short Term Goals - 04/08/20 0934      OT SHORT TERM GOAL #1   Title  Pt will be mod I with HEP for coordination for LUE, balance    Time  6    Period  Weeks    Status  On-going      OT SHORT TERM GOAL #2   Title  Pt will consistently don socks and tie shoes    Time  6    Period  Weeks  Status  New      OT SHORT TERM GOAL #3   Title  Pt will demonstrate improved coordination as evidenced by decreasing time on 9 hole peg with LUE by at least 10 seconds to assist with functional fine motor tasks.    Baseline  69.06 sec    Time  6    Period  Weeks    Status  New      OT SHORT TERM GOAL #4   Title  Pt will report overall less drops Lt hand during the day attending to Lt side consistently    Time  6    Period  Weeks    Status  New      OT SHORT TERM GOAL #5   Title  Pt will perform dynamic standing tasks w/ supervision without LOB    Time  6    Period  Weeks    Status  New        OT Long Term Goals - 03/26/20 1500      OT LONG TERM GOAL #1   Title  Pt will be independent with updated HEP including aquatic HEP prn    Time  12    Period  Weeks    Status  New      OT LONG TERM GOAL #2   Title  Pt will be mod I with shower transfers    Time  12    Period  Weeks    Status  New      OT LONG TERM GOAL #3   Title  Pt will be mod I with hot meal prep with at least two items.    Time  12    Period  Weeks    Status  New      OT  LONG TERM GOAL #4   Title  Pt demonstrate improved coordination as evidenced by decreasing time on 9 hole peg by at least 20 seconds with LUE.    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 Therapist reviewed with pt/ wife.    Occupational performance deficits (Please refer to evaluation for details):  ADL's;IADL's;Leisure    Body Structure / Function / Physical Skills  ADL;ROM;IADL;Balance;Body mechanics;Mobility;Strength;Coordination;FMC;Pain;UE functional use;Proprioception;Decreased knowledge of use of DME    Cognitive Skills  Attention    Comorbidities impacting occupational performance description:  Lt rotator cuff tear    OT Frequency  2x / week    OT Duration  12 weeks    OT Treatment/Interventions  Self-care/ADL training;Therapeutic exercise;Functional Mobility Training;Aquatic Therapy;Neuromuscular education;Manual Therapy;Splinting;Energy conservation;Therapeutic activities;DME  and/or AE instruction;Cognitive remediation/compensation;Visual/perceptual remediation/compensation;Passive range of motion;Patient/family education;Moist Heat    Plan  continue coordination,  functional balance/dynamic standing    Consulted and Agree with Plan of Care  Patient;Family member/caregiver    Family Member Consulted  wife       Patient will benefit from skilled therapeutic intervention in order to improve the  following deficits and impairments:   Body Structure / Function / Physical Skills: ADL, ROM, IADL, Balance, Body mechanics, Mobility, Strength, Coordination, FMC, Pain, UE functional use, Proprioception, Decreased knowledge of use of DME Cognitive Skills: Attention     Visit Diagnosis: Hemiplegia and hemiparesis following cerebral infarction affecting left non-dominant side (HCC)  Muscle weakness (generalized)  Other abnormalities of gait and mobility  Other lack of coordination  Visuospatial deficit    Problem List Patient Active Problem List   Diagnosis Date Noted  . Depressive reaction   . Sleep disturbance   . Urinary retention   . Bradycardia   . Essential hypertension   . Temperature elevated   . Left thyroid nodule   . Treatment-emergent central sleep apnea 10/25/2018  . 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 Chronic pain syndrome   . Chronic bilateral low back pain   . Benign essential HTN   . Tobacco abuse   . Marijuana abuse   . Hyperlipidemia   . History of CVA with residual deficit   . Dysphagia, post-stroke   . ICH (intracerebral hemorrhage) (HCC) - R thalmic/PLIC d/t HTN 123456  . Insomnia 07/13/2017  . PAD (peripheral artery disease) ([place]) 07/13/2017  . Hyperlipidemia with target LDL less than 70 07/13/2017  . Stroke-like symptom 09/22/2016  . Paresthesia 09/22/2016  . CVA (cerebral vascular accident) ([place]) 09/22/2016  . Cerebrovascular accident (CVA) due to thrombosis of left carotid artery ([place]) 09/23/2015  . Primary snoring 09/23/2015  . Hypersomnia with sleep apnea 09/23/2015  . Obstructive sleep apnea 09/23/2015  . Lacunar infarct, acute ([place]) 08/25/2015  . Sleep apnea 08/25/2015  . Dysarthria   . CVD (cardiovascular disease) 08/02/2015  . Acute hyperglycemia 08/02/2015  . Systolic hypertension with cerebrovascular disease 12/27/2014  . Epistaxis 07/28/2012  . HYPERGLYCEMIA 10/02/2010  .  NEPHROLITHIASIS 05/23/2009  . BARRETTS ESOPHAGUS 02/20/2009  . Gastroparesis 02/20/2009  . RADICULOPATHY 10/21/2008  . GERD 10/08/2008  . ESOPHAGITIS 08/15/2008  . ESOPHAGEAL STRICTURE 08/15/2008  . GASTRITIS, ACUTE 08/15/2008  . RENAL CYST 08/14/2008  . TOBACCO ABUSE 08/08/2008  . HEMATURIA, MICROSCOPIC, HX OF 08/08/2008  . PULMONARY NODULE 01/10/2008    Roniesha Hollingshead 04/16/2020, 12:52 PM  [facility] [address] [place], [place], [address] Phone: [phone]   Fax:  [phone]  Name: Richard Vaughn MRN: [id number] Date of Birth: [birth year]

## 2020-04-15 NOTE — Patient Instructions (Signed)
Continue practicing your worksheets at home. Pay special attention to your breathing. Make sure you have enough air to power your voice. If you are finding it hard to get clear, remind yourself to swallow periodically.

## 2020-04-15 NOTE — Therapy (Signed)
[facility] [address] [place], [place], [address] Phone: [phone]   Fax:  [phone]  Speech Language Pathology Treatment  Patient Details  Name: Richard Vaughn MRN: [id number] Date of Birth: 1951-06-29 Referring Provider (SLP): Reesa Chew (referral), [place], Betty, MD (documentation)   Encounter Date: 04/15/2020  End of Session - 04/15/20 1257    Visit Number  5    Number of Visits  17    Date for SLP Re-Evaluation  06/10/20    SLP Start Time  0934    SLP Stop Time   [id number]    SLP Time Calculation (min)  41 min    Activity Tolerance  Patient tolerated treatment well       Past Medical History:  Diagnosis Date  . Barrett's esophagus   . Chronic back pain   . Gastritis    Mild  . Gastroparesis   . GERD (gastroesophageal reflux disease)   . Headache   . Hyperglycemia   . Hypertension   . Nephrolithiasis   . Renal cyst   . Stricture and stenosis of esophagus   . Stroke ([place]) 07-2015  . Vision abnormalities     Past Surgical History:  Procedure Laterality Date  . [place] SURGERY  2012  . COLONOSCOPY    . [place] SURGERY  2005, 2010   replaced L4 and L5  . UPPER GASTROINTESTINAL ENDOSCOPY      There were no vitals filed for this visit.  Subjective Assessment - 04/15/20 0938    Subjective  "Up until a month ago I was good."    Currently in Pain?  Yes    Pain Score  4     Pain Location  Back    Pain Orientation  Lower            ADULT SLP TREATMENT - 04/15/20 0940      General Information   Behavior/Cognition  Alert;Cooperative;Pleasant mood      Treatment Provided   Treatment provided  Cognitive-Linquistic      Cognitive-Linquistic Treatment   Treatment focused on  Dysarthria    Skilled Treatment  SLP worked with pt on intelligibility using compensations for dysarthria. In word level tasks pt required mod A initially for overarticulation, rate, pausing, and breath  support; eventually able to fade cues to independent with pt making several self-corrections. With cognitive load pt at times required repetition and semantic cues.       Assessment / Recommendations / Plan   Plan  Continue with current plan of care      Progression Toward Goals   Progression toward goals  Progressing toward goals       SLP Education - 04/15/20 1257    Education Details  breath support/abdominal breathing    Person(s) Educated  Patient    Methods  Explanation;Handout    Comprehension  Verbalized understanding;Verbal cues required       SLP Short Term Goals - 04/15/20 1258      SLP SHORT TERM GOAL #1   Title  pt will improve emergent awareness with simple functional tasks to requiring min A rarely over three sessions    Time  2    Period  Weeks   or 9 initital sessions for all STGs   Status  On-going      SLP SHORT TERM GOAL #2   Title  pt will demo compensations for dysarthria in simple sentence responses generating at lest 90% intelligibility over 3  sessions    Time  2    Period  Weeks    Status  On-going      SLP SHORT TERM GOAL #3   Title  pt will demonstrate sustained attention for 15 minutes in mod complex tasks in 3 sessions    Time  2    Period  Weeks    Status  On-going      SLP SHORT TERM GOAL #4   Title  Pt will complete HEP for unintelligible speech with occaisonal mod A for articulation and loudnes    Time  2    Period  Weeks    Status  On-going       SLP Long Term Goals - 04/15/20 1258      SLP LONG TERM GOAL #1   Title  pt will demo selective attention to complete simple-mod complex functional tasks in mod noisy environment for 10 minutes    Time  6    Period  Weeks   or 17 total sessions for all LTGs   Status  On-going      SLP LONG TERM GOAL #2   Title  pt will demo anticipatory awareness with mod complex tasks by double checkng work 100% of the time over three sessions    Time  6    Period  Weeks    Status  On-going       SLP LONG TERM GOAL #3   Title  pt will demo speech volume in 10 minutes simple-mod complex conversation, in low 70s dB average over three consecutive sessions    Time  6    Period  Weeks    Status  On-going      SLP LONG TERM GOAL #4   Title  pt will produce 10 minutes intelligible simple-mod complex conversation using compensations, with rare min A over 3 consecutive sessions    Time  6    Period  Weeks    Status  On-going      SLP LONG TERM GOAL #5   Title  Pt will complete HEP for unintelligible speech with occaisonal min A for articulation and loudness    Time  6    Period  Weeks    Status  New       Plan - 04/15/20 1258    Clinical Impression Statement  Pt with some mild premorbid dysarthria. He presents today with mild-mod cognitive communication deficits characterized by decr'd attention and awareness, as well as significant dysarthria decreasing pt's speech intelligbility at the sentence and conversation levels.Pt would benefit from skilled ST targeting cognitive linguistics as well as dysarthria.    Speech Therapy Frequency  2x / week    Duration  --   8 weeks or 17 total sessions   Treatment/Interventions  Oral motor exercises;Compensatory techniques;Functional tasks;SLP instruction and feedback;Patient/family education;Internal/external aids;Cueing hierarchy;Cognitive reorganization    Potential to Achieve Goals  Good    Potential Considerations  Severity of impairments       Patient will benefit from skilled therapeutic intervention in order to improve the following deficits and impairments:   Dysarthria and anarthria  Cognitive communication deficit    Problem List Patient Active Problem List   Diagnosis Date Noted  . Depressive reaction   . Sleep disturbance   . Urinary retention   . Bradycardia   . Essential hypertension   . Temperature elevated   . Left thyroid nodule   . Treatment-emergent central sleep apnea 10/25/2018  . Central sleep  apnea  secondary to cerebrovascular accident (CVA) 10/25/2018  . Chronic pain syndrome   . Chronic bilateral low back pain   . Benign essential HTN   . Tobacco abuse   . Marijuana abuse   . Hyperlipidemia   . History of CVA with residual deficit   . Dysphagia, post-stroke   . ICH (intracerebral hemorrhage) (HCC) - R thalmic/PLIC d/t HTN 123456  . Insomnia 07/13/2017  . PAD (peripheral artery disease) ([place]) 07/13/2017  . Hyperlipidemia with target LDL less than 70 07/13/2017  . Stroke-like symptom 09/22/2016  . Paresthesia 09/22/2016  . CVA (cerebral vascular accident) ([place]) 09/22/2016  . Cerebrovascular accident (CVA) due to thrombosis of left carotid artery ([place]) 09/23/2015  . Primary snoring 09/23/2015  . Hypersomnia with sleep apnea 09/23/2015  . Obstructive sleep apnea 09/23/2015  . Lacunar infarct, acute ([place]) 08/25/2015  . Sleep apnea 08/25/2015  . Dysarthria   . CVD (cardiovascular disease) 08/02/2015  . Acute hyperglycemia 08/02/2015  . Systolic hypertension with cerebrovascular disease 12/27/2014  . Epistaxis 07/28/2012  . HYPERGLYCEMIA 10/02/2010  . NEPHROLITHIASIS 05/23/2009  . BARRETTS ESOPHAGUS 02/20/2009  . Gastroparesis 02/20/2009  . RADICULOPATHY 10/21/2008  . GERD 10/08/2008  . ESOPHAGITIS 08/15/2008  . ESOPHAGEAL STRICTURE 08/15/2008  . GASTRITIS, ACUTE 08/15/2008  . RENAL CYST 08/14/2008  . TOBACCO ABUSE 08/08/2008  . HEMATURIA, MICROSCOPIC, HX OF 08/08/2008  . PULMONARY NODULE 01/10/2008   Deneise Lever, [place], [place] Speech-Language Pathologist  Aliene Altes 04/15/2020, 12:59 PM  [facility] [address] [place], [place], [address] Phone: [phone]   Fax:  [phone]   Name: Richard Vaughn MRN: [id number] Date of Birth: 08/13/1951

## 2020-04-16 ENCOUNTER — Telehealth: Payer: Self-pay | Admitting: Family Medicine

## 2020-04-16 ENCOUNTER — Encounter: Payer: Self-pay | Admitting: Occupational Therapy

## 2020-04-16 DIAGNOSIS — H43393 Other vitreous opacities, bilateral: Secondary | ICD-10-CM | POA: Diagnosis not present

## 2020-04-16 DIAGNOSIS — M75122 Complete rotator cuff tear or rupture of left shoulder, not specified as traumatic: Secondary | ICD-10-CM | POA: Diagnosis not present

## 2020-04-16 DIAGNOSIS — S46012D Strain of muscle(s) and tendon(s) of the rotator cuff of left shoulder, subsequent encounter: Secondary | ICD-10-CM | POA: Diagnosis not present

## 2020-04-16 NOTE — Telephone Encounter (Signed)
Pt is calling in stating that he is out of Rx Trazodone 50 MG  Pharm: [facility] would like to see if this can be done today so that he will be able to sleep tonight pt is aware that Dr. [place] is out of the office.

## 2020-04-17 ENCOUNTER — Ambulatory Visit: Payer: Medicare Other

## 2020-04-17 ENCOUNTER — Ambulatory Visit: Payer: Medicare Other | Admitting: Speech Pathology

## 2020-04-17 ENCOUNTER — Ambulatory Visit: Payer: Medicare Other | Admitting: Occupational Therapy

## 2020-04-17 ENCOUNTER — Other Ambulatory Visit: Payer: Self-pay

## 2020-04-17 DIAGNOSIS — R278 Other lack of coordination: Secondary | ICD-10-CM

## 2020-04-17 DIAGNOSIS — R471 Dysarthria and anarthria: Secondary | ICD-10-CM | POA: Diagnosis not present

## 2020-04-17 DIAGNOSIS — R41841 Cognitive communication deficit: Secondary | ICD-10-CM | POA: Diagnosis not present

## 2020-04-17 DIAGNOSIS — R2681 Unsteadiness on feet: Secondary | ICD-10-CM | POA: Diagnosis not present

## 2020-04-17 DIAGNOSIS — M6281 Muscle weakness (generalized): Secondary | ICD-10-CM

## 2020-04-17 DIAGNOSIS — I612 Nontraumatic intracerebral hemorrhage in hemisphere, unspecified: Secondary | ICD-10-CM

## 2020-04-17 DIAGNOSIS — I69354 Hemiplegia and hemiparesis following cerebral infarction affecting left non-dominant side: Secondary | ICD-10-CM

## 2020-04-17 DIAGNOSIS — R2689 Other abnormalities of gait and mobility: Secondary | ICD-10-CM

## 2020-04-17 NOTE — Therapy (Signed)
[facility] [address] [place], [place], [address] Phone: [phone]   Fax:  [phone]  Physical Therapy Treatment/Recert/10th visit progress note  Patient Details  Name: Richard Vaughn MRN: [id number] Date of Birth: 11-04-1951 Referring Provider (PT): Reesa Chew PA-C    Progress Note  Reporting period 03/10/20 to 04/17/20`  See Note below for Objective Data and Assessment of Progress/Goals   Encounter Date: 04/17/2020  PT End of Session - 04/17/20 0941    Visit Number  10    Number of Visits  22    Date for PT Re-Evaluation  06/16/20   POC for 6 weeks, Cert for 90 days   Authorization Type  Medicare + [employer] (10th visit PN)    Progress Note Due on Visit  10    PT Start Time  0933    PT Stop Time  1017    PT Time Calculation (min)  44 min    Equipment Utilized During Treatment  Gait belt    Activity Tolerance  Patient tolerated treatment well    Behavior During Therapy  WFL for tasks assessed/performed       Past Medical History:  Diagnosis Date   Barrett's esophagus    Chronic back pain    Gastritis    Mild   Gastroparesis    GERD (gastroesophageal reflux disease)    Headache    Hyperglycemia    Hypertension    Nephrolithiasis    Renal cyst    Stricture and stenosis of esophagus    Stroke [facility]) 07-2015   Vision abnormalities     Past Surgical History:  Procedure Laterality Date   CERVICAL DISC SURGERY  2012   COLONOSCOPY     LUMBAR [place] SURGERY  2005, 2010   replaced L4 and L5   UPPER GASTROINTESTINAL ENDOSCOPY      There were no vitals filed for this visit.  Subjective Assessment - 04/17/20 1330    Subjective  Pt reports that is is doing ok. Asking PT ways he can help with stress as used to use walking and going to gym for this but no longer helping since stroke.    Patient is accompained by:  Family member   Wife   Pertinent History  Chronic Back Pain, Barrett's  Esophagus, GERD, Hyperglycemia, HTN, CVA, Hyperlipidemia, vision abnormalities, ICH in the right thalamus/posterior limb of internal capsule    Limitations  Standing;Walking;House hold activities    Patient Stated Goals  "want to walk", "get back to how I was"    Currently in Pain?  Yes    Pain Score  4     Pain Location  Back    Pain Orientation  Lower    Pain Descriptors / Indicators  Aching    Pain Type  Chronic pain    Pain Onset  More than a month ago         [facility] PT Assessment - 04/17/20 0946      Assessment   Medical Diagnosis  R Thalamic Hemorrhage    Referring Provider (PT)  Reesa Chew PA-C    Onset Date/Surgical Date  02/22/20                    [facility] Adult PT Treatment/Exercise - 04/17/20 0946      Transfers   Transfers  Sit to Stand;Stand to Sit    Sit to Stand  5: Supervision    Sit to Stand Details  Verbal cues  for technique    Five time sit to stand comments   24 sec from mat with UE support    Stand to Sit  5: Supervision    Stand to Sit Details  Pt doing well backing all the way up prior to sitting.      Ambulation/Gait   Ambulation/Gait  Yes    Ambulation/Gait Assistance  5: Supervision    Ambulation/Gait Assistance Details  Pt was cued to try to increase left foot clearance throughout. Had 3-4 times where he scuffed left foot. HR=78 after    Ambulation Distance (Feet)  690 Feet    Assistive device  Rollator    Gait Pattern  Step-through pattern;Decreased hip/knee flexion - left    Ambulation Surface  Level;Indoor      Standardized Balance Assessment   Standardized Balance Assessment  Timed Up and Go Test;Berg Balance Test      Berg Balance Test   Sit to Stand  Able to stand  independently using hands    Standing Unsupported  Able to stand safely 2 minutes    Sitting with Back Unsupported but Feet Supported on Floor or Stool  Able to sit safely and securely 2 minutes    Stand to Sit  Sits safely with minimal use of hands    Transfers   Able to transfer safely, definite need of hands    Standing Unsupported with Eyes Closed  Able to stand 10 seconds safely    Standing Ubsupported with Feet Together  Able to place feet together independently and stand 1 minute safely    From Standing, Reach Forward with Outstretched Arm  Can reach confidently >25 cm (10")    From Standing Position, Pick up Object from Floor  Able to pick up shoe safely and easily    From Standing Position, Turn to Look Behind Over each Shoulder  Looks behind from both sides and weight shifts well    Turn 360 Degrees  Able to turn 360 degrees safely but slowly    Standing Unsupported, Alternately Place Feet on Step/Stool  Able to complete >2 steps/needs minimal assist    Standing Unsupported, One Foot in Front  Able to take small step independently and hold 30 seconds    Standing on One Leg  Tries to lift leg/unable to hold 3 seconds but remains standing independently    Total Score  44      Timed Up and Go Test   TUG  Normal TUG    Normal TUG (seconds)  24   25 sec and 23 sec with rollator     Knee/Hip Exercises: Aerobic   Other Aerobic  Sci-Fit x 6 min at 4.5. HR=94 after. target HR100-107 and discussed this with patient to try to get more aerobic activity.              PT Education - 04/17/20 1331    Education Details  PT discussed with patient about continuing to walk at part with wife but suggested he try a recumband bike/stepper of some sort to try to get more aerobic activity as walking alone is not getting HR up as high to try to get more benefits of aerobic exercises to help with stress. PT also discussed with patient about working on deep breathing exercises to help with relaxation. Also suggested he speak to MD if continuing to have issues with stress/feeling down since CVA. PT educated patient on results of tsesting including improvement on TUG, 5x sit to stand and  Merrilee Jansky. Discussed recert plan.    Person(s) Educated  Patient    Methods   Explanation    Comprehension  Verbalized understanding       PT Short Term Goals - 04/17/20 0942      PT SHORT TERM GOAL #1   Title  Patient will be indepdent with initial HEP    Baseline  Pt performed HEP well in therapy and reports performing at home.    Time  3    Period  Weeks    Status  Achieved    Target Date  03/31/20      PT SHORT TERM GOAL #2   Title  Patient will improve 5x sit <> stand to < 30 seconds w/ UE use to demonstrate improved BLE strength    Baseline  36.06 secs w/ UE, 5 x sit to stand=37 sec from mat with UE on 03/28/20, 04/17/20 24 sec    Time  3    Period  Weeks    Status  Achieved    Target Date  03/31/20      PT SHORT TERM GOAL #3   Title  Patient will improve TUG to <32 seconds for improved functional mobility    Baseline  37.44 sec, TUG=40 on 03/28/20, 04/17/20 TUG=24 sec    Time  3    Period  Weeks    Status  Achieved    Target Date  03/31/20        PT Long Term Goals - 04/17/20 0957      PT LONG TERM GOAL #1   Title  Patient will be independent with final HEP    Baseline  PT continues to add to HEP    Time  6    Period  Weeks    Status  On-going      PT LONG TERM GOAL #2   Title  Patient will improve TUG time to </= 25 secs with LRAD to reduce risk for falls    Baseline  37.44, 24 sec on 04/17/20    Time  6    Period  Weeks    Status  Achieved      PT LONG TERM GOAL #3   Title  Pt will improve gait speed to >/=2.0 ft/sec. with LRAD to safely amb. in the community.    Baseline  1.36 ft    Time  6    Period  Weeks    Status  On-going      PT LONG TERM GOAL #4   Title  Pt will improve BERG score to >/=45/56 to decr. falls risk.     Baseline  36/56, 04/17/20 44/56    Time  6    Period  Weeks    Status  On-going      PT LONG TERM GOAL #5   Title  Pt will amb. 600' over level surfaces with LRAD at MOD I level to demonstrate improved functional mobility.    Baseline  690' with rollator supervision on level surfaces.    Time  6    Period   Weeks    Status  Partially Met      PT LONG TERM GOAL #6   Title  Patient will improve 5x sit <> stand to </= 20 seconds to demonstrate improved BLE strength and reduce risk for falls.    Baseline  36.06, 24 sec on 04/17/20    Time  6    Period  Weeks    Status  On-going      Updated goals: PT Short Term Goals - 04/17/20 1346      PT SHORT TERM GOAL #1   Title  Pt will ambulate >600' on level surfaces mod I for improved mobility.    Time  3    Period  Weeks    Status  New    Target Date  05/08/20      PT SHORT TERM GOAL #2   Title  Pt will decrease 5x sit to stand from 24 sec to <22 sec for improved balance and functional strength.    Time  3    Period  Weeks    Status  Revised    Target Date  05/08/20      PT Long Term Goals - 04/17/20 1352      PT LONG TERM GOAL #1   Title  Patient will be independent with final HEP    Baseline  PT continues to add to HEP    Time  6    Period  Weeks    Status  On-going    Target Date  05/29/20      PT LONG TERM GOAL #2   Title  Pt will increase Berg from 44/56 to 47/56 or more for improved balance and decreased fall risk.    Baseline  36/56, 04/17/20 44/56    Time  6    Period  Weeks    Status  New    Target Date  05/29/20      PT LONG TERM GOAL #3   Title  Pt will improve gait speed to >/=2.0 ft/sec. with LRAD to safely amb. in the community.    Baseline  1.36 ft    Time  6    Period  Weeks    Status  On-going    Target Date  05/29/20      PT LONG TERM GOAL #4   Title  Pt will ambulate >800' on varies surfaces supervision for improved community mobility.    Time  6    Period  Weeks    Status  Revised    Target Date  05/29/20            Plan - 04/17/20 1339    Clinical Impression Statement  PT reassessed goals today for recert. Pt is showing continued improvement in all balance tests. He met TUG goal with score of 24 sec and 5 x sit to stand STG with 24 sec. Berg improved to 44/56. All show improvements but still  place pt in moderate fall risk category. Pt continues to ambulate with rollator at all times supervision level as catches left foot at times. Pt will continue to benefit from skilled PT to continue to progress balance, strength and gait to maximize his independence.    Personal Factors and Comorbidities  Comorbidity 3+    Comorbidities  Chronic Back Pain, Barrett's Esophagus, GERD, Hyperglycemia, HTN, CVA, Hyperlipidemia, vision abnormalities, ICH in the right thalamus/posterior limb of internal capsule    Examination-Activity Limitations  Bed Mobility;Transfers;Stairs;Stand    Examination-Participation Restrictions  Yard Work;Community Activity    Stability/Clinical Decision Making  Evolving/Moderate complexity    Rehab Potential  Good    PT Frequency  2x / week    PT Duration  6 weeks    PT Treatment/Interventions  ADLs/Self Care Home Management;Electrical Stimulation;Moist Heat;Cryotherapy;Gait training;Stair training;Functional mobility training;Therapeutic activities;DME Instruction;Therapeutic exercise;Balance training;Neuromuscular re-education;Patient/family education;Orthotic Fit/Training;Passive range of motion;Visual/perceptual remediation/compensation    PT Next Visit Plan  How are corner balance exercises going? Try to incorporate more aerobic activity. Continue gait and transfer training with Rollator. Focus on improving left foot clearance. Strengthening exercises adding in more left hip extension, Standing Balance.    Consulted and Agree with Plan of Care  Patient;Family member/caregiver    Family Member Consulted  Wife       Patient will benefit from skilled therapeutic intervention in order to improve the following deficits and impairments:  Abnormal gait, Decreased balance, Decreased endurance, Decreased mobility, Difficulty walking, Impaired vision/preception, Decreased knowledge of precautions, Decreased strength, Decreased safety awareness, Decreased knowledge of use of DME,  Decreased activity tolerance  Visit Diagnosis: Other abnormalities of gait and mobility  Muscle weakness (generalized)  Unsteadiness on feet  Hemiplegia and hemiparesis following cerebral infarction affecting left non-dominant side [facility])     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) 10/25/2018   Chronic pain syndrome    Chronic bilateral low back pain    Benign essential HTN    Tobacco abuse    Marijuana abuse    Hyperlipidemia    History of CVA with residual deficit    Dysphagia, post-stroke    ICH (intracerebral hemorrhage) ([place]) - R thalmic/PLIC d/t HTN 40/90/5025   Insomnia 07/13/2017   PAD (peripheral artery disease) ([place]) 07/13/2017   Hyperlipidemia with target LDL less than 70 07/13/2017   Stroke-like symptom 09/22/2016   Paresthesia 09/22/2016   CVA (cerebral vascular accident) ([place]) 09/22/2016   Cerebrovascular accident (CVA) due to thrombosis of left carotid artery ([place]) 09/23/2015   Primary snoring 09/23/2015   Hypersomnia with sleep apnea 09/23/2015   Obstructive sleep apnea 09/23/2015   Lacunar infarct, acute ([place]) 08/25/2015   Sleep apnea 08/25/2015   Dysarthria    CVD (cardiovascular disease) 08/02/2015   Acute hyperglycemia 61/54/8845   Systolic hypertension with cerebrovascular disease 12/27/2014   Epistaxis 07/28/2012   HYPERGLYCEMIA 10/02/2010   NEPHROLITHIASIS 05/23/2009   BARRETTS ESOPHAGUS 02/20/2009   Gastroparesis 02/20/2009   RADICULOPATHY 10/21/2008   GERD 10/08/2008   ESOPHAGITIS 08/15/2008   ESOPHAGEAL STRICTURE 08/15/2008   GASTRITIS, ACUTE 08/15/2008   RENAL CYST 08/14/2008   TOBACCO ABUSE  08/08/2008   HEMATURIA, MICROSCOPIC, HX OF 08/08/2008   PULMONARY NODULE 01/10/2008    Electa Sniff, PT, DPT, NCS 04/17/2020, 1:44 PM  [facility] [address] [place], [place], [address] Phone: [phone]   Fax:  [phone]  Name: Richard Vaughn MRN: [id number] Date of Birth: Dec 15, 1950

## 2020-04-17 NOTE — Patient Instructions (Signed)
[facility] (not sure if this will be happening due to Covid, but you can call Lennart Pall for more information.  Date/time: Second Tuesday of each month, 7 p.m. to 8:30 p.m.  Location: [facility], Department [place] For more information, contact [facility] at [phone]

## 2020-04-17 NOTE — Therapy (Signed)
[facility] [address] [place], [place], [address] Phone: [phone]   Fax:  [phone]  Speech Language Pathology Treatment  Patient Details  Name: Richard Vaughn MRN: [id number] Date of Birth: 01-21-1951 Referring Provider (SLP): Reesa Chew (referral), [place], Betty, MD (documentation)   Encounter Date: 04/17/2020  End of Session - 04/17/20 1334    Visit Number  6    Number of Visits  17    Date for SLP Re-Evaluation  06/10/20    SLP Start Time  1017    SLP Stop Time   1100    SLP Time Calculation (min)  43 min    Activity Tolerance  Patient tolerated treatment well       Past Medical History:  Diagnosis Date  . Barrett's esophagus   . Chronic back pain   . Gastritis    Mild  . Gastroparesis   . GERD (gastroesophageal reflux disease)   . Headache   . Hyperglycemia   . Hypertension   . Nephrolithiasis   . Renal cyst   . Stricture and stenosis of esophagus   . Stroke ([place]) 07-2015  . Vision abnormalities     Past Surgical History:  Procedure Laterality Date  . [place] SURGERY  2012  . COLONOSCOPY    . [place] SURGERY  2005, 2010   replaced L4 and L5  . UPPER GASTROINTESTINAL ENDOSCOPY      There were no vitals filed for this visit.  Subjective Assessment - 04/17/20 1021    Subjective  "Just rehabing."    Currently in Pain?  Yes    Pain Score  4     Pain Location  Back    Pain Orientation  Lower    Pain Descriptors / Indicators  Aching    Pain Type  Chronic pain            ADULT SLP TREATMENT - 04/17/20 1022      General Information   Behavior/Cognition  Alert;Cooperative;Pleasant mood      Treatment Provided   Treatment provided  Cognitive-Linquistic      Pain Assessment   Pain Assessment  No/denies pain      Cognitive-Linquistic Treatment   Treatment focused on  Dysarthria    Skilled Treatment  Pt was not coordinating phonation/respiration when entering ST  room; voice was intermittently aphonic. SLP instructed pt in abdominal breathing with occasional min-mod A required at rest, fading to rare min A. Progressing to imitating phrase and then sentence level tasks with AB, occasional min A for overarticulation, pausing. Pt read sentences out loud, and began self-correcting productions for either inadequate breath support or articulation ~80% of the time. Some carryover into conversation by end of session.      Assessment / Recommendations / Plan   Plan  Continue with current plan of care      Progression Toward Goals   Progression toward goals  Progressing toward goals       SLP Education - 04/17/20 1333    Education Details  abdominal breathing    Person(s) Educated  Patient    Methods  Explanation;Demonstration;Verbal cues    Comprehension  Verbalized understanding;Need further instruction;Returned demonstration       SLP Short Term Goals - 04/17/20 1335      SLP SHORT TERM GOAL #1   Title  pt will improve emergent awareness with simple functional tasks to requiring min A rarely over three sessions    Time  2    Period  Weeks   or 9 initital sessions for all STGs   Status  On-going      SLP SHORT TERM GOAL #2   Title  pt will demo compensations for dysarthria in simple sentence responses generating at lest 90% intelligibility over 3 sessions    Baseline  04/17/20    Time  2    Period  Weeks    Status  On-going      SLP SHORT TERM GOAL #3   Title  pt will demonstrate sustained attention for 15 minutes in mod complex tasks in 3 sessions    Time  2    Period  Weeks    Status  On-going      SLP SHORT TERM GOAL #4   Title  Pt will complete HEP for unintelligible speech with occaisonal mod A for articulation and loudnes    Baseline  04/17/20    Time  2    Period  Weeks    Status  On-going       SLP Long Term Goals - 04/17/20 1336      SLP LONG TERM GOAL #1   Title  pt will demo selective attention to complete simple-mod complex  functional tasks in mod noisy environment for 10 minutes    Time  6    Period  Weeks   or 17 total sessions for all LTGs   Status  On-going      SLP LONG TERM GOAL #2   Title  pt will demo anticipatory awareness with mod complex tasks by double checkng work 100% of the time over three sessions    Time  6    Period  Weeks    Status  On-going      SLP LONG TERM GOAL #3   Title  pt will demo speech volume in 10 minutes simple-mod complex conversation, in low 70s dB average over three consecutive sessions    Time  6    Period  Weeks    Status  On-going      SLP LONG TERM GOAL #4   Title  pt will produce 10 minutes intelligible simple-mod complex conversation using compensations, with rare min A over 3 consecutive sessions    Time  6    Period  Weeks    Status  On-going      SLP LONG TERM GOAL #5   Title  Pt will complete HEP for unintelligible speech with occaisonal min A for articulation and loudness    Time  6    Period  Weeks    Status  New       Plan - 04/17/20 1334    Clinical Impression Statement  Pt with some mild premorbid dysarthria. He presents today with mild-mod cognitive communication deficits characterized by decr'd attention and awareness, as well as significant dysarthria decreasing pt's speech intelligbility at the sentence and conversation levels. Pt aware of and beginning to make corrections of unintelligible productions in structured tasks today. Pt would benefit from skilled ST targeting cognitive linguistics as well as dysarthria.    Speech Therapy Frequency  2x / week    Duration  --   8 weeks or 17 total sessions   Treatment/Interventions  Oral motor exercises;Compensatory techniques;Functional tasks;SLP instruction and feedback;Patient/family education;Internal/external aids;Cueing hierarchy;Cognitive reorganization    Potential to Achieve Goals  Good    Potential Considerations  Severity of impairments       Patient will benefit from skilled  therapeutic intervention in order to improve the following deficits and impairments:   Dysarthria and anarthria  Cognitive communication deficit  Nontraumatic hemorrhage of right cerebral hemisphere [facility])    Problem List Patient Active Problem List   Diagnosis Date Noted  . Depressive reaction   . Sleep disturbance   . Urinary retention   . Bradycardia   . Essential hypertension   . Temperature elevated   . Left thyroid nodule   . Treatment-emergent central sleep apnea 10/25/2018  . 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 Chronic pain syndrome   . Chronic bilateral low back pain   . Benign essential HTN   . Tobacco abuse   . Marijuana abuse   . Hyperlipidemia   . History of CVA with residual deficit   . Dysphagia, post-stroke   . ICH (intracerebral hemorrhage) ([facility]) - R thalmic/PLIC d/t HTN 123456  . Insomnia 07/13/2017  . PAD (peripheral artery disease) ([place]) 07/13/2017  . Hyperlipidemia with target LDL less than 70 07/13/2017  . Stroke-like symptom 09/22/2016  . Paresthesia 09/22/2016  . CVA (cerebral vascular accident) ([place]) 09/22/2016  . Cerebrovascular accident (CVA) due to thrombosis of left carotid artery ([place]) 09/23/2015  . Primary snoring 09/23/2015  . Hypersomnia with sleep apnea 09/23/2015  . Obstructive sleep apnea 09/23/2015  . Lacunar infarct, acute ([place]) 08/25/2015  . Sleep apnea 08/25/2015  . Dysarthria   . CVD (cardiovascular disease) 08/02/2015  . Acute hyperglycemia 08/02/2015  . Systolic hypertension with cerebrovascular disease 12/27/2014  . Epistaxis 07/28/2012  . HYPERGLYCEMIA 10/02/2010  . NEPHROLITHIASIS 05/23/2009  . BARRETTS ESOPHAGUS 02/20/2009  . Gastroparesis 02/20/2009  . RADICULOPATHY 10/21/2008  . GERD 10/08/2008  . ESOPHAGITIS 08/15/2008  . ESOPHAGEAL STRICTURE 08/15/2008  . GASTRITIS, ACUTE 08/15/2008  . RENAL CYST 08/14/2008  . TOBACCO ABUSE 08/08/2008  . HEMATURIA, MICROSCOPIC, HX  OF 08/08/2008  . PULMONARY NODULE 01/10/2008   Deneise Lever, [place], [place] Speech-Language Pathologist  Aliene Altes 04/17/2020, 1:36 PM  [facility] [address] [place], [place], [address] Phone: [phone]   Fax:  [phone]   Name: Richard Vaughn MRN: [id number] Date of Birth: 11-Jul-1951

## 2020-04-17 NOTE — Therapy (Signed)
[facility] [address] [place], [place], [address] Phone: [phone]   Fax:  [phone]  Occupational Therapy Treatment  Patient Details  Name: Richard Vaughn MRN: [id number] Date of Birth: [birth year] Referring Provider (OT): Reesa Chew, [place]   Encounter Date: 04/17/2020  OT End of Session - 04/17/20 1418    Visit Number  6    Number of Visits  25    Date for OT Re-Evaluation  06/26/20    Authorization Type  MCR, BS/BS    Authorization - Visit Number  6    Authorization - Number of Visits  10    Progress Note Due on Visit  10    OT Start Time  1100    OT Stop Time  1145    OT Time Calculation (min)  45 min    Activity Tolerance  Patient tolerated treatment well    Behavior During Therapy  [facility] for tasks assessed/performed       Past Medical History:  Diagnosis Date  . Barrett's esophagus   . Chronic back pain   . Gastritis    Mild  . Gastroparesis   . GERD (gastroesophageal reflux disease)   . Headache   . Hyperglycemia   . Hypertension   . Nephrolithiasis   . Renal cyst   . Stricture and stenosis of esophagus   . Stroke ([place]) 07-2015  . Vision abnormalities     Past Surgical History:  Procedure Laterality Date  . [place] SURGERY  2012  . COLONOSCOPY    . [place] SURGERY  2005, 2010   replaced L4 and L5  . UPPER GASTROINTESTINAL ENDOSCOPY      There were no vitals filed for this visit.  Subjective Assessment - 04/17/20 1105    Subjective   Standing up and balance is most challenging    Patient is accompanied by:  Family member   wife   Pertinent History  Rt thalamic hemmorhage w/ Lt hemiparesis 02/22/20. PMH: Stroke 2019 w/ Lt hemiparesis, HLD, HTN, GERD, Barretts esophagus    Limitations  fall risk, no driving, Lt shoulder rotator cuff tear (see MRI results 03/14/20)    Patient Stated Goals  Return to normal, driving, baking    Currently in Pain?  Yes   lower back pain - OT not  addressing      Assessed progress towards first 2 STG's - pt has no further questions with HEP's and reports consistently donning/doffing shoes/socks and made recommendations for lower back pain.  Dynamic standing to retrieve and replace clothespins from antenna LUE while reaching outside BOS ipsilateral and contralaterally. Pt also worked on stepping through patterns w/ Lt leg during task. Worked on sit-stand x 3 with cues for proper positioning and less reliance on hands - pt practiced w/o UE support x 3 with greater ease and less assist w/ each rep (w/ use of gait belt and min assist). Pt/wife shown how to practice in safe controlled setting at home.  UBE x 8 min, level 3 for UE strength/endurance.  Pt noted to bump into things on Lt side with walker and sits to Rt side of seat and to Rt of center of mat.                       OT Short Term Goals - 04/17/20 1419      OT SHORT TERM GOAL #1   Title  Pt will be mod I with  HEP for coordination for LUE, balance    Time  6    Period  Weeks    Status  Achieved      OT SHORT TERM GOAL #2   Title  Pt will consistently don socks and tie shoes    Time  6    Period  Weeks    Status  Achieved      OT SHORT TERM GOAL #3   Title  Pt will demonstrate improved coordination as evidenced by decreasing time on 9 hole peg with LUE by at least 10 seconds to assist with functional fine motor tasks.    Baseline  69.06 sec    Time  6    Period  Weeks    Status  New      OT SHORT TERM GOAL #4   Title  Pt will report overall less drops Lt hand during the day attending to Lt side consistently    Time  6    Period  Weeks    Status  New      OT SHORT TERM GOAL #5   Title  Pt will perform dynamic standing tasks w/ supervision without LOB    Time  6    Period  Weeks    Status  New        OT Long Term Goals - 03/26/20 1500      OT LONG TERM GOAL #1   Title  Pt will be independent with updated HEP including aquatic HEP prn     Time  12    Period  Weeks    Status  New      OT LONG TERM GOAL #2   Title  Pt will be mod I with shower transfers    Time  12    Period  Weeks    Status  New      OT LONG TERM GOAL #3   Title  Pt will be mod I with hot meal prep with at least two items.    Time  12    Period  Weeks    Status  New      OT LONG TERM GOAL #4   Title  Pt demonstrate improved coordination as evidenced by decreasing time on 9 hole peg by at least 20 seconds with LUE.    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 Pt w/ Lt inattention and possible field cut Lt side - will further assess next session. Pt bumping into things on Lt w/ walker and sitting to Rt side of seat. Pt w/  cues needed for sit to/from stand for safety    Occupational performance deficits (Please refer to evaluation for details):  ADL's;IADL's;Leisure    Body Structure / Function / Physical Skills  ADL;ROM;IADL;Balance;Body mechanics;Mobility;Strength;Coordination;FMC;Pain;UE functional use;Proprioception;Decreased knowledge of use of DME    Cognitive Skills  Attention    Comorbidities impacting occupational performance description:  Lt rotator cuff tear    OT Frequency  2x / week    OT Duration  12 weeks    OT Treatment/Interventions  Self-care/ADL training;Therapeutic exercise;Functional Mobility Training;Aquatic Therapy;Neuromuscular education;Manual Therapy;Splinting;Energy  conservation;Therapeutic activities;DME and/or AE instruction;Cognitive remediation/compensation;Visual/perceptual remediation/compensation;Passive range of motion;Patient/family education;Moist Heat    Plan  further assess vision to determine if Lt visual field cut vs. inattention or both, discuss visual scanning strategies and practice visual scanning with walker negotiation    Consulted and Agree with Plan of Care  Patient;Family member/caregiver    Family Member Consulted  wife       Patient will benefit from skilled therapeutic intervention in order to improve the following deficits and impairments:   Body Structure / Function / Physical Skills: ADL, ROM, IADL, Balance, Body mechanics, Mobility, Strength, Coordination, FMC, Pain, UE functional use, Proprioception, Decreased knowledge of use of DME Cognitive Skills: Attention     Visit Diagnosis: Hemiplegia and hemiparesis following cerebral infarction affecting left non-dominant side (HCC)  Other lack of coordination  Unsteadiness on feet  Muscle weakness (generalized)    Problem List Patient Active Problem List   Diagnosis Date Noted  . Depressive reaction   . Sleep disturbance   . Urinary retention   . Bradycardia   . Essential hypertension   . Temperature elevated   . Left thyroid nodule   . Treatment-emergent central sleep apnea 10/25/2018  . 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 Chronic pain syndrome   . Chronic bilateral low back pain   . Benign essential HTN   . Tobacco abuse   . Marijuana abuse   . Hyperlipidemia   . History of CVA with residual deficit   . Dysphagia, post-stroke   . ICH (intracerebral hemorrhage) (HCC) - R thalmic/PLIC d/t HTN 71/04/2693  . Insomnia 07/13/2017  . PAD (peripheral artery disease) ([place]) 07/13/2017  . Hyperlipidemia with target LDL less than 70 07/13/2017  . Stroke-like symptom 09/22/2016  . Paresthesia 09/22/2016  . CVA (cerebral  vascular accident) ([place]) 09/22/2016  . Cerebrovascular accident (CVA) due to thrombosis of left carotid artery ([place]) 09/23/2015  . Primary snoring 09/23/2015  . Hypersomnia with sleep apnea 09/23/2015  . Obstructive sleep apnea 09/23/2015  . Lacunar infarct, acute ([place]) 08/25/2015  . Sleep apnea 08/25/2015  . Dysarthria   . CVD (cardiovascular disease) 08/02/2015  . Acute hyperglycemia 08/02/2015  . Systolic hypertension with cerebrovascular disease 12/27/2014  . Epistaxis 07/28/2012  . HYPERGLYCEMIA 10/02/2010  . NEPHROLITHIASIS 05/23/2009  . BARRETTS ESOPHAGUS 02/20/2009  . Gastroparesis 02/20/2009  . RADICULOPATHY 10/21/2008  . GERD 10/08/2008  . ESOPHAGITIS 08/15/2008  . ESOPHAGEAL STRICTURE 08/15/2008  . GASTRITIS, ACUTE 08/15/2008  . RENAL CYST 08/14/2008  . TOBACCO ABUSE 08/08/2008  . HEMATURIA, MICROSCOPIC, HX OF 08/08/2008  . PULMONARY NODULE 01/10/2008    Carey Bullocks, OTR/L 04/17/2020, 2:23 PM  [facility] [address] [place], [place], [address] Phone: [phone]   Fax:  [phone]  Name: Richard Vaughn MRN: [id number] Date of Birth: Dec 14, 1950

## 2020-04-18 ENCOUNTER — Encounter
Payer: Federal, State, Local not specified - PPO | Attending: Physical Medicine and Rehabilitation | Admitting: Physical Medicine and Rehabilitation

## 2020-04-18 NOTE — Telephone Encounter (Signed)
Routing to PCP

## 2020-04-20 ENCOUNTER — Other Ambulatory Visit: Payer: Self-pay | Admitting: Family Medicine

## 2020-04-20 DIAGNOSIS — G4700 Insomnia, unspecified: Secondary | ICD-10-CM

## 2020-04-21 NOTE — Telephone Encounter (Signed)
Noted  

## 2020-04-21 NOTE — Telephone Encounter (Signed)
It was documented on my last note that he will continue with trazodone 50 mg at bedtime as needed for insomnia. Prescription for trazodone was sent. Thanks, BJ

## 2020-04-22 ENCOUNTER — Encounter: Payer: Self-pay | Admitting: Occupational Therapy

## 2020-04-22 ENCOUNTER — Ambulatory Visit: Payer: Medicare Other | Admitting: Speech Pathology

## 2020-04-22 ENCOUNTER — Ambulatory Visit: Payer: Medicare Other

## 2020-04-22 ENCOUNTER — Other Ambulatory Visit: Payer: Self-pay

## 2020-04-22 ENCOUNTER — Ambulatory Visit: Payer: Medicare Other | Admitting: Occupational Therapy

## 2020-04-22 DIAGNOSIS — R2681 Unsteadiness on feet: Secondary | ICD-10-CM

## 2020-04-22 DIAGNOSIS — R41841 Cognitive communication deficit: Secondary | ICD-10-CM | POA: Diagnosis not present

## 2020-04-22 DIAGNOSIS — I69354 Hemiplegia and hemiparesis following cerebral infarction affecting left non-dominant side: Secondary | ICD-10-CM

## 2020-04-22 DIAGNOSIS — R278 Other lack of coordination: Secondary | ICD-10-CM

## 2020-04-22 DIAGNOSIS — M6281 Muscle weakness (generalized): Secondary | ICD-10-CM | POA: Diagnosis not present

## 2020-04-22 DIAGNOSIS — R2689 Other abnormalities of gait and mobility: Secondary | ICD-10-CM

## 2020-04-22 DIAGNOSIS — R41842 Visuospatial deficit: Secondary | ICD-10-CM

## 2020-04-22 DIAGNOSIS — R471 Dysarthria and anarthria: Secondary | ICD-10-CM | POA: Diagnosis not present

## 2020-04-22 DIAGNOSIS — I612 Nontraumatic intracerebral hemorrhage in hemisphere, unspecified: Secondary | ICD-10-CM

## 2020-04-22 NOTE — Patient Instructions (Signed)
Use the habit tracker to log your participation in your exercises. Add some habits (like walking) that you can do now that help you feel good. Bring your habit tracker on Thursday when you see Glendell Docker.   Talk with your primary doctor about depression. You may want to see if you can do a video visit sooner than your followup in early August.

## 2020-04-22 NOTE — Therapy (Signed)
[facility] [address] [place], [place], [address] Phone: [phone]   Fax:  [phone]  Physical Therapy Treatment  Patient Details  Name: Richard Vaughn MRN: [id number] Date of Birth: May 30, 1951 Referring Provider (PT): Reesa Chew PA-C   Encounter Date: 04/22/2020  PT End of Session - 04/22/20 1102    Visit Number  11    Number of Visits  22    Date for PT Re-Evaluation  06/16/20   POC for 6 weeks, Cert for 90 days   Authorization Type  Medicare + [employer] (10th visit PN)    Progress Note Due on Visit  10    PT Start Time  1101    PT Stop Time  1144    PT Time Calculation (min)  43 min    Equipment Utilized During Treatment  Gait belt    Activity Tolerance  Patient tolerated treatment well    Behavior During Therapy  WFL for tasks assessed/performed       Past Medical History:  Diagnosis Date  . Barrett's esophagus   . Chronic back pain   . Gastritis    Mild  . Gastroparesis   . GERD (gastroesophageal reflux disease)   . Headache   . Hyperglycemia   . Hypertension   . Nephrolithiasis   . Renal cyst   . Stricture and stenosis of esophagus   . Stroke ([place]) 07-2015  . Vision abnormalities     Past Surgical History:  Procedure Laterality Date  . [place] SURGERY  2012  . COLONOSCOPY    . [place] SURGERY  2005, 2010   replaced L4 and L5  . UPPER GASTROINTESTINAL ENDOSCOPY      There were no vitals filed for this visit.  Subjective Assessment - 04/22/20 1102    Subjective  Patient reports being doing good, no new complaints. Patient reports that over the weekend he tripped and fell into a chair. No injuries. Wife reports it is a tight space.    Patient is accompained by:  Family member   Wife   Pertinent History  Chronic Back Pain, Barrett's Esophagus, GERD, Hyperglycemia, HTN, CVA, Hyperlipidemia, vision abnormalities, ICH in the right thalamus/posterior limb of internal capsule    Limitations  Standing;Walking;House hold activities    Patient Stated Goals  "want to walk", "get back to how I was"    Currently in Pain?  Yes    Pain Score  3     Pain Location  Back    Pain Orientation  Lower    Pain Descriptors / Indicators  Aching    Pain Type  Chronic pain    Pain Onset  More than a month ago    Pain Frequency  Constant    Aggravating Factors   activity    Pain Relieving Factors  rest                        [facility] Adult PT Treatment/Exercise - 04/22/20 1130      Transfers   Transfers  Sit to Stand;Stand to Sit    Sit to Stand  5: Supervision    Sit to Stand Details  Verbal cues for technique    Sit to Stand Details (indicate cue type and reason)  require verbal cues for hand placement initially, demonstrate carryover throughout session    Stand to Sit  5: Supervision      Ambulation/Gait   Ambulation/Gait  Yes  Ambulation/Gait Assistance  5: Supervision;4: Min guard    Ambulation/Gait Assistance Details  Completed gait training with rollator initially x 345 ft. Followed with gait training x 200 ft with no AD and Min guard from PT for steadying. Patient continued to require curing for improved toe clearance during gait training, continues to demonstrate scuffing of foot intermittently.     Ambulation Distance (Feet)  345 Feet   200   Assistive device  Rollator;None    Gait Pattern  Step-through pattern;Decreased hip/knee flexion - left    Ambulation Surface  Level;Indoor    Gait Comments  Wife and patient reporting ambulation w/o AD at times, PT educating on the stability AD provides at this time and encourages ambulation with this for improved safety.       High Level Balance   High Level Balance Activities  Marching forwards    High Level Balance Comments  Completed standing marching in // bars, w/o UE support 2 x 10 reps.       Neuro Re-ed    Neuro Re-ed Details   Completed forward and back step overs, over foam beam with LLE 2 x 10  reps, verbal cues to bring left leg back past right and to ensure clearance of LLE.       Knee/Hip Exercises: Aerobic   Other Aerobic  SciFit x 8 min at Level 3.5. Focus on keeping RPM above 70, with PT providing verbal cues for improved pace due to patient falling below 70 multiple times during completion.           Balance Exercises - 04/22/20 0001      Balance Exercises: Standing   Balance Beam  Standing on balance beam, focus on holding steady with EO 2 x 1 min each. Progressed to EC, 3 x 30 secs each, with increased sway noted.           PT Short Term Goals - 04/17/20 1346      PT SHORT TERM GOAL #1   Title  Pt will ambulate >600' on level surfaces mod I for improved mobility.    Time  3    Period  Weeks    Status  New    Target Date  05/08/20      PT SHORT TERM GOAL #2   Title  Pt will decrease 5x sit to stand from 24 sec to <22 sec for improved balance and functional strength.    Time  3    Period  Weeks    Status  Revised    Target Date  05/08/20        PT Long Term Goals - 04/17/20 1352      PT LONG TERM GOAL #1   Title  Patient will be independent with final HEP    Baseline  PT continues to add to HEP    Time  6    Period  Weeks    Status  On-going    Target Date  05/29/20      PT LONG TERM GOAL #2   Title  Pt will increase Berg from 44/56 to 47/56 or more for improved balance and decreased fall risk.    Baseline  36/56, 04/17/20 44/56    Time  6    Period  Weeks    Status  New    Target Date  05/29/20      PT LONG TERM GOAL #3   Title  Pt will improve gait speed to >/=2.0 ft/sec. with  LRAD to safely amb. in the community.    Baseline  1.36 ft    Time  6    Period  Weeks    Status  On-going    Target Date  05/29/20      PT LONG TERM GOAL #4   Title  Pt will ambulate >800' on varies surfaces supervision for improved community mobility.    Time  6    Period  Weeks    Status  Revised    Target Date  05/29/20            Plan -  04/22/20 1344    Clinical Impression Statement  Today's skilled PT session included continued gait training with rollator and w/o AD. Patient requiring min guard when ambulating w/o AD. Continued NMR focused on improvements in strength of RLE and overall balance. Pt will continue to benefit from skilled PT services to address deficits and progress toward unmet goals.    Personal Factors and Comorbidities  Comorbidity 3+    Comorbidities  Chronic Back Pain, Barrett's Esophagus, GERD, Hyperglycemia, HTN, CVA, Hyperlipidemia, vision abnormalities, ICH in the right thalamus/posterior limb of internal capsule    Examination-Activity Limitations  Bed Mobility;Transfers;Stairs;Stand    Examination-Participation Restrictions  Yard Work;Community Activity    Stability/Clinical Decision Making  Evolving/Moderate complexity    Rehab Potential  Good    PT Frequency  2x / week    PT Duration  6 weeks    PT Treatment/Interventions  ADLs/Self Care Home Management;Electrical Stimulation;Moist Heat;Cryotherapy;Gait training;Stair training;Functional mobility training;Therapeutic activities;DME Instruction;Therapeutic exercise;Balance training;Neuromuscular re-education;Patient/family education;Orthotic Fit/Training;Passive range of motion;Visual/perceptual remediation/compensation    PT Next Visit Plan  Incorporate more aerobic activity. Continue gait and transfer training with Rollator. Focus on improving left foot clearance. Strengthening exercises adding in more left hip extension, Standing Balance.    Consulted and Agree with Plan of Care  Patient;Family member/caregiver    Family Member Consulted  Wife       Patient will benefit from skilled therapeutic intervention in order to improve the following deficits and impairments:  Abnormal gait, Decreased balance, Decreased endurance, Decreased mobility, Difficulty walking, Impaired vision/preception, Decreased knowledge of precautions, Decreased strength, Decreased  safety awareness, Decreased knowledge of use of DME, Decreased activity tolerance  Visit Diagnosis: Hemiplegia and hemiparesis following cerebral infarction affecting left non-dominant side (HCC)  Other lack of coordination  Unsteadiness on feet  Muscle weakness (generalized)  Other abnormalities of gait and mobility     Problem List Patient Active Problem List   Diagnosis Date Noted  . Depressive reaction   . Sleep disturbance   . Urinary retention   . Bradycardia   . Essential hypertension   . Temperature elevated   . Left thyroid nodule   . Treatment-emergent central sleep apnea 10/25/2018  . 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 Chronic pain syndrome   . Chronic bilateral low back pain   . Benign essential HTN   . Tobacco abuse   . Marijuana abuse   . Hyperlipidemia   . History of CVA with residual deficit   . Dysphagia, post-stroke   . ICH (intracerebral hemorrhage) (HCC) - R thalmic/PLIC d/t HTN 93/81/8299  . Insomnia 07/13/2017  . PAD (peripheral artery disease) ([place]) 07/13/2017  . Hyperlipidemia with target LDL less than 70 07/13/2017  . Stroke-like symptom 09/22/2016  . Paresthesia 09/22/2016  . CVA (cerebral vascular accident) ([place]) 09/22/2016  . Cerebrovascular accident (CVA) due to thrombosis of left carotid artery ([place])  09/23/2015  . Primary snoring 09/23/2015  . Hypersomnia with sleep apnea 09/23/2015  . Obstructive sleep apnea 09/23/2015  . Lacunar infarct, acute ([place]) 08/25/2015  . Sleep apnea 08/25/2015  . Dysarthria   . CVD (cardiovascular disease) 08/02/2015  . Acute hyperglycemia 08/02/2015  . Systolic hypertension with cerebrovascular disease 12/27/2014  . Epistaxis 07/28/2012  . HYPERGLYCEMIA 10/02/2010  . NEPHROLITHIASIS 05/23/2009  . BARRETTS ESOPHAGUS 02/20/2009  . Gastroparesis 02/20/2009  . RADICULOPATHY 10/21/2008  . GERD 10/08/2008  . ESOPHAGITIS 08/15/2008  . ESOPHAGEAL STRICTURE  08/15/2008  . GASTRITIS, ACUTE 08/15/2008  . RENAL CYST 08/14/2008  . TOBACCO ABUSE 08/08/2008  . HEMATURIA, MICROSCOPIC, HX OF 08/08/2008  . PULMONARY NODULE 01/10/2008    Jones Bales, PT, DPT 04/22/2020, 1:49 PM  [facility] [address] [place], [place], [address] Phone: [phone]   Fax:  [phone]  Name: KEASTON PILE MRN: [id number] Date of Birth: 22-Mar-1951

## 2020-04-22 NOTE — Therapy (Signed)
[facility] [address] [place], [place], [address] Phone: [phone]   Fax:  [phone]  Occupational Therapy Treatment  Patient Details  Name: Richard Vaughn MRN: [id number] Date of Birth: December 17, 1950 Referring Provider (OT): Reesa Chew, [place]   Encounter Date: 04/22/2020  OT End of Session - 04/22/20 1021    Visit Number  7    Number of Visits  25    Date for OT Re-Evaluation  06/26/20    Authorization Type  MCR, BS/BS    Authorization - Visit Number  7    Authorization - Number of Visits  10    Progress Note Due on Visit  10    OT Start Time  1020    OT Stop Time  1100    OT Time Calculation (min)  40 min    Activity Tolerance  Patient tolerated treatment well    Behavior During Therapy  [facility] for tasks assessed/performed       Past Medical History:  Diagnosis Date  . Barrett's esophagus   . Chronic back pain   . Gastritis    Mild  . Gastroparesis   . GERD (gastroesophageal reflux disease)   . Headache   . Hyperglycemia   . Hypertension   . Nephrolithiasis   . Renal cyst   . Stricture and stenosis of esophagus   . Stroke ([place]) 07-2015  . Vision abnormalities     Past Surgical History:  Procedure Laterality Date  . [place] SURGERY  2012  . COLONOSCOPY    . [place] SURGERY  2005, 2010   replaced L4 and L5  . UPPER GASTROINTESTINAL ENDOSCOPY      There were no vitals filed for this visit.  Subjective Assessment - 04/22/20 1020    Patient is accompanied by:  Family member   wife   Pertinent History  Rt thalamic hemmorhage w/ Lt hemiparesis 02/22/20. PMH: Stroke 2019 w/ Lt hemiparesis, HLD, HTN, GERD, Barretts esophagus    Limitations  fall risk, no driving, Lt shoulder rotator cuff tear (see MRI results 03/14/20)    Patient Stated Goals  Return to normal, driving, baking    Currently in Pain?  Yes    Pain Score  3     Pain Location  Back    Pain Descriptors / Indicators  Aching    Pain Type  Chronic pain    Pain Onset  More than a month ago    Pain Frequency  Constant    Aggravating Factors   activity    Pain Relieving Factors  rest            Treatment: Tabletop number cancellation 1.5 M,  100% accuracy pt appeared to use organized scan pattern. Arm bike x 6 mins level 1 for conditioning Environmental scanning: 6 items located first pass, 4 items second pass and 1 item 3rd pass. Pt required increased time and min v.c. Discussed with pt/ wife and recommended pt practices scanning in grocery store. Discussed that this would impact pt's ability to drive and driving is not recommended. Purdue pegboard for LUE fine motor coordination, min difficulty, v.c for upright posture to avoid hiking left shoulder                 OT Short Term Goals - 04/17/20 1419      OT SHORT TERM GOAL #1   Title  Pt will be mod I with HEP for coordination for LUE, balance  Time  6    Period  Weeks    Status  Achieved      OT SHORT TERM GOAL #2   Title  Pt will consistently don socks and tie shoes    Time  6    Period  Weeks    Status  Achieved      OT SHORT TERM GOAL #3   Title  Pt will demonstrate improved coordination as evidenced by decreasing time on 9 hole peg with LUE by at least 10 seconds to assist with functional fine motor tasks.    Baseline  69.06 sec    Time  6    Period  Weeks    Status  New      OT SHORT TERM GOAL #4   Title  Pt will report overall less drops Lt hand during the day attending to Lt side consistently    Time  6    Period  Weeks    Status  New      OT SHORT TERM GOAL #5   Title  Pt will perform dynamic standing tasks w/ supervision without LOB    Time  6    Period  Weeks    Status  New        OT Long Term Goals - 03/26/20 1500      OT LONG TERM GOAL #1   Title  Pt will be independent with updated HEP including aquatic HEP prn    Time  12    Period  Weeks    Status  New      OT LONG TERM GOAL #2   Title  Pt will be  mod I with shower transfers    Time  12    Period  Weeks    Status  New      OT LONG TERM GOAL #3   Title  Pt will be mod I with hot meal prep with at least two items.    Time  12    Period  Weeks    Status  New      OT LONG TERM GOAL #4   Title  Pt demonstrate improved coordination as evidenced by decreasing time on 9 hole peg by at least 20 seconds with LUE.    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    Occupational performance deficits (Please refer to evaluation for details):  ADL's;IADL's;Leisure    Body Structure / Function / Physical Skills  ADL;ROM;IADL;Balance;Body mechanics;Mobility;Strength;Coordination;FMC;Pain;UE functional use;Proprioception;Decreased knowledge of use of DME    Cognitive Skills  Attention  Comorbidities impacting occupational performance description:  Lt rotator cuff tear    OT Frequency  2x / week    OT Duration  12 weeks    OT Treatment/Interventions  Self-care/ADL training;Therapeutic exercise;Functional Mobility Training;Aquatic Therapy;Neuromuscular education;Manual Therapy;Splinting;Energy conservation;Therapeutic activities;DME and/or AE instruction;Cognitive remediation/compensation;Visual/perceptual remediation/compensation;Passive range of motion;Patient/family education;Moist Heat    Plan  continue to work towards unmet goals.    Consulted and Agree with Plan of  Care  Patient;Family member/caregiver    Family Member Consulted  wife       Patient will benefit from skilled therapeutic intervention in order to improve the following deficits and impairments:   Body Structure / Function / Physical Skills: ADL, ROM, IADL, Balance, Body mechanics, Mobility, Strength, Coordination, FMC, Pain, UE functional use, Proprioception, Decreased knowledge of use of DME Cognitive Skills: Attention     Visit Diagnosis: Hemiplegia and hemiparesis following cerebral infarction affecting left non-dominant side (HCC)  Other lack of coordination  Unsteadiness on feet  Muscle weakness (generalized)    Problem List Patient Active Problem List   Diagnosis Date Noted  . Depressive reaction   . Sleep disturbance   . Urinary retention   . Bradycardia   . Essential hypertension   . Temperature elevated   . Left thyroid nodule   . Treatment-emergent central sleep apnea 10/25/2018  . 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 Chronic pain syndrome   . Chronic bilateral low back pain   . Benign essential HTN   . Tobacco abuse   . Marijuana abuse   . Hyperlipidemia   . History of CVA with residual deficit   . Dysphagia, post-stroke   . ICH (intracerebral hemorrhage) (HCC) - R thalmic/PLIC d/t HTN 11/94/1740  . Insomnia 07/13/2017  . PAD (peripheral artery disease) ([place]) 07/13/2017  . Hyperlipidemia with target LDL less than 70 07/13/2017  . Stroke-like symptom 09/22/2016  . Paresthesia 09/22/2016  . CVA (cerebral vascular accident) ([place]) 09/22/2016  . Cerebrovascular accident (CVA) due to thrombosis of left carotid artery ([place]) 09/23/2015  . Primary snoring 09/23/2015  . Hypersomnia with sleep apnea 09/23/2015  . Obstructive sleep apnea 09/23/2015  . Lacunar infarct, acute ([place]) 08/25/2015  . Sleep apnea 08/25/2015  . Dysarthria   . CVD (cardiovascular disease) 08/02/2015  . Acute hyperglycemia 08/02/2015  . Systolic  hypertension with cerebrovascular disease 12/27/2014  . Epistaxis 07/28/2012  . HYPERGLYCEMIA 10/02/2010  . NEPHROLITHIASIS 05/23/2009  . BARRETTS ESOPHAGUS 02/20/2009  . Gastroparesis 02/20/2009  . RADICULOPATHY 10/21/2008  . GERD 10/08/2008  . ESOPHAGITIS 08/15/2008  . ESOPHAGEAL STRICTURE 08/15/2008  . GASTRITIS, ACUTE 08/15/2008  . RENAL CYST 08/14/2008  . TOBACCO ABUSE 08/08/2008  . HEMATURIA, MICROSCOPIC, HX OF 08/08/2008  . PULMONARY NODULE 01/10/2008    Nadalyn Deringer 04/22/2020, 10:52 AM  [facility] [address] [place], [place], [address] Phone: [phone]   Fax:  [phone]  Name: JAISHAWN WITZKE MRN: [id number] Date of Birth: June 18, 1951

## 2020-04-22 NOTE — Therapy (Signed)
[facility] [address] [place], [place], [address] Phone: [phone]   Fax:  [phone]  Speech Language Pathology Treatment  Patient Details  Name: Richard Vaughn MRN: [id number] Date of Birth: 1951-10-21 Referring Provider (SLP): Reesa Chew (referral), [place], Betty, MD (documentation)   Encounter Date: 04/22/2020  End of Session - 04/22/20 1313    Visit Number  7    Number of Visits  17    Date for SLP Re-Evaluation  06/10/20    SLP Start Time  0940   pt 10 min late   SLP Stop Time   73    SLP Time Calculation (min)  35 min    Activity Tolerance  Patient tolerated treatment well       Past Medical History:  Diagnosis Date  . Barrett's esophagus   . Chronic back pain   . Gastritis    Mild  . Gastroparesis   . GERD (gastroesophageal reflux disease)   . Headache   . Hyperglycemia   . Hypertension   . Nephrolithiasis   . Renal cyst   . Stricture and stenosis of esophagus   . Stroke ([place]) 07-2015  . Vision abnormalities     Past Surgical History:  Procedure Laterality Date  . [place] SURGERY  2012  . COLONOSCOPY    . [place] SURGERY  2005, 2010   replaced L4 and L5  . UPPER GASTROINTESTINAL ENDOSCOPY      There were no vitals filed for this visit.  Subjective Assessment - 04/22/20 0945    Subjective  "To be honest, no."    Currently in Pain?  Yes    Pain Score  4     Pain Location  Back    Pain Orientation  Lower    Pain Descriptors / Indicators  Aching    Pain Type  Chronic pain            ADULT SLP TREATMENT - 04/22/20 0946      General Information   Behavior/Cognition  Alert;Cooperative;Pleasant mood      Treatment Provided   Treatment provided  Cognitive-Linquistic      Pain Assessment   Pain Assessment  No/denies pain      Cognitive-Linquistic Treatment   Treatment focused on  Dysarthria    Skilled Treatment  Patient has not practiced dysarthria HEP.  Reports feeling down; "I have other things on my mind." SLP encouraged pt to speak with his MD about these symptoms. SLP explained to pt that practice outside of ST is critical for improvement/carryover of compensatory strategies. Motivation was a factor in pt completing HEP during previous course of therapy; SLP and patient discussed possible break as well as strategies such as routine to increase participation. Pt named components of HEP and SLP listed these separately in a habit tracker for pt. Encouraged pt to add other daily "habits" or priorities such as walking. Pt to bring tracker next session.       Assessment / Recommendations / Plan   Plan  Continue with current plan of care      Progression Toward Goals   Progression toward goals  Not progressing toward goals (comment)   has not worked on [place] at home since previous session.      SLP Education - 04/22/20 1313    Education Details  regular practice at home necessary for pt to make functional gains    Person(s) Educated  Patient    Methods  Explanation  Comprehension  Verbalized understanding       SLP Short Term Goals - 04/22/20 1315      SLP SHORT TERM GOAL #1   Title  pt will improve emergent awareness with simple functional tasks to requiring min A rarely over three sessions    Time  1    Period  Weeks   or 9 initital sessions for all STGs   Status  On-going      SLP SHORT TERM GOAL #2   Title  pt will demo compensations for dysarthria in simple sentence responses generating at lest 90% intelligibility over 3 sessions    Baseline  04/17/20    Time  1    Period  Weeks    Status  On-going      SLP SHORT TERM GOAL #3   Title  pt will demonstrate sustained attention for 15 minutes in mod complex tasks in 3 sessions    Time  1    Period  Weeks    Status  On-going      SLP SHORT TERM GOAL #4   Title  Pt will complete HEP for unintelligible speech with occaisonal mod A for articulation and loudnes    Baseline  04/17/20     Time  1    Period  Weeks    Status  On-going       SLP Long Term Goals - 04/22/20 1315      SLP LONG TERM GOAL #1   Title  pt will demo selective attention to complete simple-mod complex functional tasks in mod noisy environment for 10 minutes    Time  5    Period  Weeks   or 17 total sessions for all LTGs   Status  On-going      SLP LONG TERM GOAL #2   Title  pt will demo anticipatory awareness with mod complex tasks by double checkng work 100% of the time over three sessions    Time  5    Period  Weeks    Status  On-going      SLP LONG TERM GOAL #3   Title  pt will demo speech volume in 10 minutes simple-mod complex conversation, in low 70s dB average over three consecutive sessions    Time  5    Period  Weeks    Status  On-going      SLP LONG TERM GOAL #4   Title  pt will produce 10 minutes intelligible simple-mod complex conversation using compensations, with rare min A over 3 consecutive sessions    Time  5    Period  Weeks    Status  On-going      SLP LONG TERM GOAL #5   Title  Pt will complete HEP for unintelligible speech with occaisonal min A for articulation and loudness    Time  5    Period  Weeks    Status  New       Plan - 04/22/20 1314    Clinical Impression Statement  Pt with some mild premorbid dysarthria. He presents today with mild-mod cognitive communication deficits characterized by decr'd attention and awareness, as well as significant dysarthria decreasing pt's speech intelligbility at the sentence and conversation levels. Pt reporting decreased motivation as well as some symptoms of depression; encouraged him to speak with his MD. Discussed that regular practice outside [place] necessary for functional gains; may consider therapy hold if participation does not increase in next 1-2 sessions. Pt would benefit  from skilled ST targeting cognitive linguistics as well as dysarthria.    Speech Therapy Frequency  2x / week    Duration  --   8 weeks or 17  total sessions   Treatment/Interventions  Oral motor exercises;Compensatory techniques;Functional tasks;SLP instruction and feedback;Patient/family education;Internal/external aids;Cueing hierarchy;Cognitive reorganization    Potential to Achieve Goals  Good    Potential Considerations  Severity of impairments       Patient will benefit from skilled therapeutic intervention in order to improve the following deficits and impairments:   Dysarthria and anarthria  Cognitive communication deficit  Nontraumatic hemorrhage of right cerebral hemisphere [facility])    Problem List Patient Active Problem List   Diagnosis Date Noted  . Depressive reaction   . Sleep disturbance   . Urinary retention   . Bradycardia   . Essential hypertension   . Temperature elevated   . Left thyroid nodule   . Treatment-emergent central sleep apnea 10/25/2018  . 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 Chronic pain syndrome   . Chronic bilateral low back pain   . Benign essential HTN   . Tobacco abuse   . Marijuana abuse   . Hyperlipidemia   . History of CVA with residual deficit   . Dysphagia, post-stroke   . ICH (intracerebral hemorrhage) (HCC) - R thalmic/PLIC d/t HTN 74/16/3845  . Insomnia 07/13/2017  . PAD (peripheral artery disease) ([place]) 07/13/2017  . Hyperlipidemia with target LDL less than 70 07/13/2017  . Stroke-like symptom 09/22/2016  . Paresthesia 09/22/2016  . CVA (cerebral vascular accident) ([place]) 09/22/2016  . Cerebrovascular accident (CVA) due to thrombosis of left carotid artery ([place]) 09/23/2015  . Primary snoring 09/23/2015  . Hypersomnia with sleep apnea 09/23/2015  . Obstructive sleep apnea 09/23/2015  . Lacunar infarct, acute ([place]) 08/25/2015  . Sleep apnea 08/25/2015  . Dysarthria   . CVD (cardiovascular disease) 08/02/2015  . Acute hyperglycemia 08/02/2015  . Systolic hypertension with cerebrovascular disease 12/27/2014  . Epistaxis  07/28/2012  . HYPERGLYCEMIA 10/02/2010  . NEPHROLITHIASIS 05/23/2009  . BARRETTS ESOPHAGUS 02/20/2009  . Gastroparesis 02/20/2009  . RADICULOPATHY 10/21/2008  . GERD 10/08/2008  . ESOPHAGITIS 08/15/2008  . ESOPHAGEAL STRICTURE 08/15/2008  . GASTRITIS, ACUTE 08/15/2008  . RENAL CYST 08/14/2008  . TOBACCO ABUSE 08/08/2008  . HEMATURIA, MICROSCOPIC, HX OF 08/08/2008  . PULMONARY NODULE 01/10/2008   Deneise Lever, [place], [place] Speech-Language Pathologist   Aliene Altes 04/22/2020, 1:16 PM  [facility] [address] [place], [place], [address] Phone: [phone]   Fax:  [phone]   Name: MOUSSA WIEGAND MRN: [id number] Date of Birth: 1951-08-23

## 2020-04-24 ENCOUNTER — Ambulatory Visit: Payer: Medicare Other

## 2020-04-24 ENCOUNTER — Other Ambulatory Visit: Payer: Self-pay

## 2020-04-24 ENCOUNTER — Ambulatory Visit: Payer: Medicare Other | Admitting: Occupational Therapy

## 2020-04-24 DIAGNOSIS — R2681 Unsteadiness on feet: Secondary | ICD-10-CM | POA: Diagnosis not present

## 2020-04-24 DIAGNOSIS — R278 Other lack of coordination: Secondary | ICD-10-CM

## 2020-04-24 DIAGNOSIS — M6281 Muscle weakness (generalized): Secondary | ICD-10-CM

## 2020-04-24 DIAGNOSIS — R293 Abnormal posture: Secondary | ICD-10-CM

## 2020-04-24 DIAGNOSIS — R41841 Cognitive communication deficit: Secondary | ICD-10-CM | POA: Diagnosis not present

## 2020-04-24 DIAGNOSIS — R2689 Other abnormalities of gait and mobility: Secondary | ICD-10-CM | POA: Diagnosis not present

## 2020-04-24 DIAGNOSIS — R471 Dysarthria and anarthria: Secondary | ICD-10-CM | POA: Diagnosis not present

## 2020-04-24 DIAGNOSIS — I69354 Hemiplegia and hemiparesis following cerebral infarction affecting left non-dominant side: Secondary | ICD-10-CM

## 2020-04-24 DIAGNOSIS — R41842 Visuospatial deficit: Secondary | ICD-10-CM

## 2020-04-24 NOTE — Therapy (Signed)
[facility] [address] [place], [place], [address] Phone: [phone]   Fax:  [phone]  Occupational Therapy Treatment  Patient Details  Name: Richard Vaughn MRN: [id number] Date of Birth: June 19, 1951 Referring Provider (OT): Reesa Chew, [place]   Encounter Date: 04/24/2020   OT End of Session - 04/24/20 0945    Visit Number 8    Number of Visits 25    Date for OT Re-Evaluation 06/26/20    Authorization Type MCR, BS/BS    Authorization - Visit Number 8    Authorization - Number of Visits 10    Progress Note Due on Visit 10    OT Start Time 0945    OT Stop Time 1013    OT Time Calculation (min) 28 min    Activity Tolerance Patient tolerated treatment well    Behavior During Therapy [facility] for tasks assessed/performed           Past Medical History:  Diagnosis Date  . Barrett's esophagus   . Chronic back pain   . Gastritis    Mild  . Gastroparesis   . GERD (gastroesophageal reflux disease)   . Headache   . Hyperglycemia   . Hypertension   . Nephrolithiasis   . Renal cyst   . Stricture and stenosis of esophagus   . Stroke ([place]) 07-2015  . Vision abnormalities     Past Surgical History:  Procedure Laterality Date  . [place] SURGERY  2012  . COLONOSCOPY    . [place] SURGERY  2005, 2010   replaced L4 and L5  . UPPER GASTROINTESTINAL ENDOSCOPY      There were no vitals filed for this visit.   Subjective Assessment - 04/24/20 1255    Subjective  Pt reports continued left shoulder pain    Currently in Pain? Yes    Pain Score 5     Pain Location Shoulder    Pain Orientation Left    Pain Descriptors / Indicators Aching    Pain Type Chronic pain    Pain Onset More than a month ago    Pain Frequency Intermittent    Aggravating Factors  malpositioning    Pain Relieving Factors repositioning                Treatment: supine chest press and shoulder flexion, min v.c for  positioning, seated low range shoulder flexion, and internal external rotation closed chain, then reviewed yellow theraband rowing 10 reps each min v.c for proper positioning. Standing dynamic reaching with left and right UE's to place graded clothespins on vertical antennae, no LOB, close supervision for safety. Therapist checked 9 hole peg test, pt met goal                  OT Short Term Goals - 04/24/20 0957      OT SHORT TERM GOAL #1   Title Pt will be mod I with HEP for coordination for LUE, balance    Time 6    Period Weeks    Status Achieved      OT SHORT TERM GOAL #2   Title Pt will consistently don socks and tie shoes    Time 6    Period Weeks    Status Achieved      OT SHORT TERM GOAL #3   Title Pt will demonstrate improved coordination as evidenced by decreasing time on 9 hole peg with LUE by at least 10 seconds to assist with  functional fine motor tasks.    Baseline 69.06 sec    Time 6    Period Weeks    Status Achieved   51.43     OT SHORT TERM GOAL #4   Title Pt will report overall less drops Lt hand during the day attending to Lt side consistently    Time 6    Period Weeks    Status On-going      OT SHORT TERM GOAL #5   Title Pt will perform dynamic standing tasks w/ supervision without LOB    Time 6    Period Weeks    Status On-going             OT Long Term Goals - 03/26/20 1500      OT LONG TERM GOAL #1   Title Pt will be independent with updated HEP including aquatic HEP prn    Time 12    Period Weeks    Status New      OT LONG TERM GOAL #2   Title Pt will be mod I with shower transfers    Time 12    Period Weeks    Status New      OT LONG TERM GOAL #3   Title Pt will be mod I with hot meal prep with at least two items.    Time 12    Period Weeks    Status New      OT LONG TERM GOAL #4   Title Pt demonstrate improved coordination as evidenced by decreasing time on 9 hole peg by at least 20 seconds with LUE.    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    Occupational performance deficits (Please refer to evaluation for details): ADL's;IADL's;Leisure    Body Structure / Function / Physical Skills ADL;ROM;IADL;Balance;Body mechanics;Mobility;Strength;Coordination;FMC;Pain;UE functional use;Proprioception;Decreased knowledge of use of DME    Cognitive Skills Attention    Comorbidities impacting occupational performance description: Lt rotator cuff tear    OT Frequency 2x / week    OT Duration 12 weeks    OT Treatment/Interventions Self-care/ADL training;Therapeutic exercise;Functional Mobility Training;Aquatic Therapy;Neuromuscular education;Manual Therapy;Splinting;Energy conservation;Therapeutic activities;DME and/or AE instruction;Cognitive remediation/compensation;Visual/perceptual remediation/compensation;Passive range of motion;Patient/family education;Moist Heat    Plan continue to work towards unmet goals.    Consulted and Agree with Plan of Care Patient;Family member/caregiver    Family Member Consulted wife           Patient will benefit from skilled therapeutic intervention in order to improve the following deficits and impairments:   Body Structure /  Function / Physical Skills: ADL, ROM, IADL, Balance, Body mechanics, Mobility, Strength, Coordination, FMC, Pain, UE  functional use, Proprioception, Decreased knowledge of use of DME Cognitive Skills: Attention     Visit Diagnosis: Other lack of coordination  Hemiplegia and hemiparesis following cerebral infarction affecting left non-dominant side (HCC)  Muscle weakness (generalized)  Visuospatial deficit  Abnormal posture    Problem List Patient Active Problem List   Diagnosis Date Noted  . Depressive reaction   . Sleep disturbance   . Urinary retention   . Bradycardia   . Essential hypertension   . Temperature elevated   . Left thyroid nodule   . Treatment-emergent central sleep apnea 10/25/2018  . 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 Chronic pain syndrome   . Chronic bilateral low back pain   . Benign essential HTN   . Tobacco abuse   . Marijuana abuse   . Hyperlipidemia   . History of CVA with residual deficit   . Dysphagia, post-stroke   . ICH (intracerebral hemorrhage) (HCC) - R thalmic/PLIC d/t HTN 57/49/3552  . Insomnia 07/13/2017  . PAD (peripheral artery disease) ([place]) 07/13/2017  . Hyperlipidemia with target LDL less than 70 07/13/2017  . Stroke-like symptom 09/22/2016  . Paresthesia 09/22/2016  . CVA (cerebral vascular accident) ([place]) 09/22/2016  . Cerebrovascular accident (CVA) due to thrombosis of left carotid artery ([place]) 09/23/2015  . Primary snoring 09/23/2015  . Hypersomnia with sleep apnea 09/23/2015  . Obstructive sleep apnea 09/23/2015  . Lacunar infarct, acute ([place]) 08/25/2015  . Sleep apnea 08/25/2015  . Dysarthria   . CVD (cardiovascular disease) 08/02/2015  . Acute hyperglycemia 08/02/2015  . Systolic hypertension with cerebrovascular disease 12/27/2014  . Epistaxis 07/28/2012  . HYPERGLYCEMIA 10/02/2010  . NEPHROLITHIASIS 05/23/2009  . BARRETTS ESOPHAGUS 02/20/2009  . Gastroparesis 02/20/2009  . RADICULOPATHY 10/21/2008  . GERD 10/08/2008  . ESOPHAGITIS 08/15/2008  . ESOPHAGEAL STRICTURE 08/15/2008  .  GASTRITIS, ACUTE 08/15/2008  . RENAL CYST 08/14/2008  . TOBACCO ABUSE 08/08/2008  . HEMATURIA, MICROSCOPIC, HX OF 08/08/2008  . PULMONARY NODULE 01/10/2008    Desera Graffeo 04/24/2020, 12:56 PM  [facility] [address] [place], [place], [address] Phone: [phone]   Fax:  [phone]  Name: Richard Vaughn MRN: [id number] Date of Birth: 02/05/1951

## 2020-04-24 NOTE — Therapy (Signed)
[facility] [address] [place], [place], [address] Phone: [phone]   Fax:  [phone]  Physical Therapy Treatment  Patient Details  Name: Richard Vaughn MRN: [id number] Date of Birth: 06/30/1951 Referring Provider (PT): Reesa Chew PA-C   Encounter Date: 04/24/2020   PT End of Session - 04/24/20 1111    Visit Number 12    Number of Visits 22    Date for PT Re-Evaluation 06/16/20   POC for 6 weeks, Cert for 90 days   Authorization Type Medicare + [employer] (10th visit PN)    Progress Note Due on Visit 10    PT Start Time 1105   finishing up with Speech Therapy   PT Stop Time 1145    PT Time Calculation (min) 40 min    Equipment Utilized During Treatment Gait belt    Activity Tolerance Patient tolerated treatment well    Behavior During Therapy [facility] for tasks assessed/performed           Past Medical History:  Diagnosis Date  . Barrett's esophagus   . Chronic back pain   . Gastritis    Mild  . Gastroparesis   . GERD (gastroesophageal reflux disease)   . Headache   . Hyperglycemia   . Hypertension   . Nephrolithiasis   . Renal cyst   . Stricture and stenosis of esophagus   . Stroke ([place]) 07-2015  . Vision abnormalities     Past Surgical History:  Procedure Laterality Date  . [place] SURGERY  2012  . COLONOSCOPY    . [place] SURGERY  2005, 2010   replaced L4 and L5  . UPPER GASTROINTESTINAL ENDOSCOPY      There were no vitals filed for this visit.   Subjective Assessment - 04/24/20 1109    Subjective Patient reports being doing okay. Just finished up with Speech Therapy session. Patient reports being frustrated not being able to get up without use rollator or arms.    Patient is accompained by: Family member   Wife   Pertinent History Chronic Back Pain, Barrett's Esophagus, GERD, Hyperglycemia, HTN, CVA, Hyperlipidemia, vision abnormalities, ICH in the right thalamus/posterior limb of  internal capsule    Limitations Standing;Walking;House hold activities    Patient Stated Goals "want to walk", "get back to how I was"    Currently in Pain? Yes    Pain Score 3     Pain Location Back    Pain Orientation Lower    Pain Descriptors / Indicators Aching    Pain Type Chronic pain    Pain Onset More than a month ago                             [facility] Adult PT Treatment/Exercise - 04/24/20 1124      Transfers   Transfers Sit to Stand;Stand to Sit    Sit to Stand 5: Supervision    Sit to Stand Details Verbal cues for technique    Sit to Stand Details (indicate cue type and reason) completed sit <> stand training per patients report of having difficulty with this. PT educating on proper form to ensure proper starting position, positioned near edge of surface, feet aligned, and ensure proper forward lean prior to standing. Patient primary issue at this time is reduced forward lean, and upon standing loses balance backward. Focused sit <> stand training on forward lean and then using BLE to  push into standing. Patient demo improved ability to stand w/o UE support at end of session.    Stand to Sit 5: Supervision      Ambulation/Gait   Ambulation/Gait Yes    Ambulation/Gait Assistance 5: Supervision;4: Min guard    Ambulation/Gait Assistance Details Completed gait training x 2 laps with rollator. Followed x 2 laps w/o Rollator. PT provided verbal cues for improved heel toe pattern with gait. PT providing intermittent CGA for steadying when ambulating w/o AD.     Ambulation Distance (Feet) 460 Feet    Assistive device Rollator;None    Gait Pattern Step-through pattern;Decreased hip/knee flexion - left    Ambulation Surface Level;Indoor    Gait Comments PT educating on continuing to use AD on outdoor surfaces. Can start to ambulate short distances in the household with another individual for safety. PT discouraged walking without AD when alone at this time for safety  concerns.       Neuro Re-ed    Neuro Re-ed Details  Standing in corner on foam: completed alternating toe taps to cone w/o UE support. PT providing verbal cues for proper placement of feet as patient demonstrate standing partially on foam with increased repetition.                Balance Exercises - 04/24/20 0001      Balance Exercises: Standing   Standing Eyes Opened Narrow base of support (BOS);Head turns;Foam/compliant surface;Other (comment)   2 x 10 reps   Standing Eyes Closed Narrow base of support (BOS);Foam/compliant surface;3 reps;30 secs    Tandem Stance Eyes closed;3 reps;30 secs               PT Short Term Goals - 04/17/20 1346      PT SHORT TERM GOAL #1   Title Pt will ambulate >600' on level surfaces mod I for improved mobility.    Time 3    Period Weeks    Status New    Target Date 05/08/20      PT SHORT TERM GOAL #2   Title Pt will decrease 5x sit to stand from 24 sec to <22 sec for improved balance and functional strength.    Time 3    Period Weeks    Status Revised    Target Date 05/08/20             PT Long Term Goals - 04/17/20 1352      PT LONG TERM GOAL #1   Title Patient will be independent with final HEP    Baseline PT continues to add to HEP    Time 6    Period Weeks    Status On-going    Target Date 05/29/20      PT LONG TERM GOAL #2   Title Pt will increase Berg from 44/56 to 47/56 or more for improved balance and decreased fall risk.    Baseline 36/56, 04/17/20 44/56    Time 6    Period Weeks    Status New    Target Date 05/29/20      PT LONG TERM GOAL #3   Title Pt will improve gait speed to >/=2.0 ft/sec. with LRAD to safely amb. in the community.    Baseline 1.36 ft    Time 6    Period Weeks    Status On-going    Target Date 05/29/20      PT LONG TERM GOAL #4   Title Pt will ambulate >800' on varies surfaces  supervision for improved community mobility.    Time 6    Period Weeks    Status Revised    Target Date  05/29/20                 Plan - 04/24/20 1419    Clinical Impression Statement PT providing education on proper sequencing and technique of sit to stands in today's session per patients request. Patient reporting frustation with this at home, especially from low surfaces. Continued gait training and balance activites as tolerated by patient. Pt will continue to benefit from skilled PT services to progress toward goals.    Personal Factors and Comorbidities Comorbidity 3+    Comorbidities Chronic Back Pain, Barrett's Esophagus, GERD, Hyperglycemia, HTN, CVA, Hyperlipidemia, vision abnormalities, ICH in the right thalamus/posterior limb of internal capsule    Examination-Activity Limitations Bed Mobility;Transfers;Stairs;Stand    Examination-Participation Restrictions Yard Work;Community Activity    Stability/Clinical Decision Making Evolving/Moderate complexity    Rehab Potential Good    PT Frequency 2x / week    PT Duration 6 weeks    PT Treatment/Interventions ADLs/Self Care Home Management;Electrical Stimulation;Moist Heat;Cryotherapy;Gait training;Stair training;Functional mobility training;Therapeutic activities;DME Instruction;Therapeutic exercise;Balance training;Neuromuscular re-education;Patient/family education;Orthotic Fit/Training;Passive range of motion;Visual/perceptual remediation/compensation    PT Next Visit Plan Incorporate more aerobic activity. Continue gait and transfer training with Rollator. Focus on improving left foot clearance. Strengthening exercises adding in more left hip extension, Standing Balance.    Consulted and Agree with Plan of Care Patient;Family member/caregiver    Family Member Consulted Wife           Patient will benefit from skilled therapeutic intervention in order to improve the following deficits and impairments:  Abnormal gait, Decreased balance, Decreased endurance, Decreased mobility, Difficulty walking, Impaired vision/preception,  Decreased knowledge of precautions, Decreased strength, Decreased safety awareness, Decreased knowledge of use of DME, Decreased activity tolerance  Visit Diagnosis: Hemiplegia and hemiparesis following cerebral infarction affecting left non-dominant side (HCC)  Other lack of coordination  Unsteadiness on feet  Muscle weakness (generalized)  Other abnormalities of gait and mobility     Problem List Patient Active Problem List   Diagnosis Date Noted  . Depressive reaction   . Sleep disturbance   . Urinary retention   . Bradycardia   . Essential hypertension   . Temperature elevated   . Left thyroid nodule   . Treatment-emergent central sleep apnea 10/25/2018  . 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 Chronic pain syndrome   . Chronic bilateral low back pain   . Benign essential HTN   . Tobacco abuse   . Marijuana abuse   . Hyperlipidemia   . History of CVA with residual deficit   . Dysphagia, post-stroke   . ICH (intracerebral hemorrhage) (HCC) - R thalmic/PLIC d/t HTN 02/63/7858  . Insomnia 07/13/2017  . PAD (peripheral artery disease) ([place]) 07/13/2017  . Hyperlipidemia with target LDL less than 70 07/13/2017  . Stroke-like symptom 09/22/2016  . Paresthesia 09/22/2016  . CVA (cerebral vascular accident) ([place]) 09/22/2016  . Cerebrovascular accident (CVA) due to thrombosis of left carotid artery ([place]) 09/23/2015  . Primary snoring 09/23/2015  . Hypersomnia with sleep apnea 09/23/2015  . Obstructive sleep apnea 09/23/2015  . Lacunar infarct, acute ([place]) 08/25/2015  . Sleep apnea 08/25/2015  . Dysarthria   . CVD (cardiovascular disease) 08/02/2015  . Acute hyperglycemia 08/02/2015  . Systolic hypertension with cerebrovascular disease 12/27/2014  . Epistaxis 07/28/2012  . HYPERGLYCEMIA 10/02/2010  . NEPHROLITHIASIS 05/23/2009  .  BARRETTS ESOPHAGUS 02/20/2009  . Gastroparesis 02/20/2009  . RADICULOPATHY 10/21/2008  . GERD  10/08/2008  . ESOPHAGITIS 08/15/2008  . ESOPHAGEAL STRICTURE 08/15/2008  . GASTRITIS, ACUTE 08/15/2008  . RENAL CYST 08/14/2008  . TOBACCO ABUSE 08/08/2008  . HEMATURIA, MICROSCOPIC, HX OF 08/08/2008  . PULMONARY NODULE 01/10/2008    Jones Bales, PT, DPT 04/24/2020, 2:21 PM  [facility] [address] [place], [place], [address] Phone: [phone]   Fax:  [phone]  Name: Richard Vaughn MRN: [id number] Date of Birth: 20-Jan-1951

## 2020-04-24 NOTE — Therapy (Signed)
[facility] [address] [place], [place], [address] Phone: [phone]   Fax:  [phone]  Speech Language Pathology Treatment  Patient Details  Name: Richard Vaughn MRN: [id number] Date of Birth: 18-Aug-1951 Referring Provider (SLP): Reesa Chew (referral), [place], Betty, MD (documentation)   Encounter Date: 04/24/2020   End of Session - 04/24/20 2306    Visit Number 8    Number of Visits 17    Date for SLP Re-Evaluation 06/10/20    SLP Start Time 33    SLP Stop Time  1100    SLP Time Calculation (min) 40 min    Activity Tolerance Patient tolerated treatment well           Past Medical History:  Diagnosis Date  . Barrett's esophagus   . Chronic back pain   . Gastritis    Mild  . Gastroparesis   . GERD (gastroesophageal reflux disease)   . Headache   . Hyperglycemia   . Hypertension   . Nephrolithiasis   . Renal cyst   . Stricture and stenosis of esophagus   . Stroke ([place]) 07-2015  . Vision abnormalities     Past Surgical History:  Procedure Laterality Date  . [place] SURGERY  2012  . COLONOSCOPY    . [place] SURGERY  2005, 2010   replaced L4 and L5  . UPPER GASTROINTESTINAL ENDOSCOPY      There were no vitals filed for this visit.   Subjective Assessment - 04/24/20 1031    Patient is accompained by: Family member   wife   Currently in Pain? Yes    Pain Score 3     Pain Location Back    Pain Orientation Lower    Pain Descriptors / Indicators Aching    Pain Type Chronic pain    Pain Onset More than a month ago    Pain Frequency Constant    Aggravating Factors  ctivity    Pain Relieving Factors rest                 ADULT SLP TREATMENT - 04/24/20 1033      General Information   Behavior/Cognition Alert;Cooperative;Pleasant mood      Treatment Provided   Treatment provided Cognitive-Linquistic      Cognitive-Linquistic Treatment   Treatment focused on  Dysarthria    Skilled Treatment (Home management/self care 9 minutes) Given pt's previous session with ST, SLP discussed with pt/wife that some patients take a break from therapy for a period of time to work on other things which may distract them from their therapy progress or goals. Pt and wife thanked SLP. (Speech tx-individual) SLP then guided pt through work on abdominal breathing (AB) - pt with approx 80% success at rest. SLP provided pt a list of phrases that SLP instructed pt to read with a LOUD voice and pt spontaneously used AB. SLP provided pt with a larger list of phrases/sentences and asked him to perform a selection of sentences focusing on LOUD speech.       Assessment / Recommendations / Plan   Plan Continue with current plan of care      Progression Toward Goals   Progression toward goals Progressing toward goals            SLP Education - 04/24/20 2304    Education Details taking a break from therapy can sometimes be helpful    Person(s) Educated Patient;Spouse    Methods Explanation  Comprehension Verbalized understanding            SLP Short Term Goals - 04/24/20 2307      SLP SHORT TERM GOAL #1   Title pt will improve emergent awareness with simple functional tasks to requiring min A rarely over three sessions    Period --   or 9 initital sessions for all STGs   Status Not Met      SLP SHORT TERM GOAL #2   Title pt will demo compensations for dysarthria in simple sentence responses generating at lest 90% intelligibility over 3 sessions    Baseline 04/17/20    Status Partially Met      SLP SHORT TERM GOAL #3   Title pt will demonstrate sustained attention for 15 minutes in mod complex tasks in 3 sessions    Status Not Met      SLP SHORT TERM GOAL #4   Title Pt will complete HEP for unintelligible speech with occaisonal mod A for articulation and loudnes    Baseline 04/17/20    Status Achieved            SLP Long Term Goals - 04/24/20 2308      SLP  LONG TERM GOAL #1   Title pt will demo selective attention to complete simple functional tasks in min noisy environment for 10 minutes    Time 5    Period Weeks   or 17 total sessions for all LTGs   Status Revised      SLP LONG TERM GOAL #2   Title pt will demo anticipatory awareness with mod complex tasks by double checkng work 100% of the time over three sessions    Time 5    Period Weeks    Status On-going      SLP LONG TERM GOAL #3   Title pt will demo speech volume in 8 minutes simple complex conversation, in low 70s dB average over three consecutive sessions    Time 5    Period Weeks    Status Revised      SLP LONG TERM GOAL #4   Title pt will produce 10 minutes intelligible simple-mod complex conversation using compensations, with rare min A over 3 consecutive sessions    Time 5    Period Weeks    Status On-going      SLP LONG TERM GOAL #5   Title Pt will complete HEP for unintelligible speech with occaisonal min A for articulation and loudness    Time 5    Period Weeks    Status On-going            Plan - 04/24/20 2306    Clinical Impression Statement Pt with some mild premorbid dysarthria. He presents today with mild-mod cognitive communication deficits characterized by decr'd attention and awareness, as well as significant dysarthria decreasing pt's speech intelligbility at the sentence and conversation levels. Pt reporting decreased motivation as well as some symptoms of depression; encouraged him to speak with his MD. Discussed that regular practice outside [place] necessary for functional gains; may consider therapy hold if participation does not increase in next 1-2 sessions. Pt would benefit from skilled ST targeting cognitive linguistics as well as dysarthria.    Speech Therapy Frequency 2x / week    Duration --   8 weeks or 17 total sessions   Treatment/Interventions Oral motor exercises;Compensatory techniques;Functional tasks;SLP instruction and  feedback;Patient/family education;Internal/external aids;Cueing hierarchy;Cognitive reorganization    Potential to Achieve Goals Good  Potential Considerations Severity of impairments           Patient will benefit from skilled therapeutic intervention in order to improve the following deficits and impairments:   Dysarthria and anarthria  Cognitive communication deficit    Problem List Patient Active Problem List   Diagnosis Date Noted  . Depressive reaction   . Sleep disturbance   . Urinary retention   . Bradycardia   . Essential hypertension   . Temperature elevated   . Left thyroid nodule   . Treatment-emergent central sleep apnea 10/25/2018  . 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 Chronic pain syndrome   . Chronic bilateral low back pain   . Benign essential HTN   . Tobacco abuse   . Marijuana abuse   . Hyperlipidemia   . History of CVA with residual deficit   . Dysphagia, post-stroke   . ICH (intracerebral hemorrhage) (HCC) - R thalmic/PLIC d/t HTN 78/12/4430  . Insomnia 07/13/2017  . PAD (peripheral artery disease) ([place]) 07/13/2017  . Hyperlipidemia with target LDL less than 70 07/13/2017  . Stroke-like symptom 09/22/2016  . Paresthesia 09/22/2016  . CVA (cerebral vascular accident) ([place]) 09/22/2016  . Cerebrovascular accident (CVA) due to thrombosis of left carotid artery ([place]) 09/23/2015  . Primary snoring 09/23/2015  . Hypersomnia with sleep apnea 09/23/2015  . Obstructive sleep apnea 09/23/2015  . Lacunar infarct, acute ([place]) 08/25/2015  . Sleep apnea 08/25/2015  . Dysarthria   . CVD (cardiovascular disease) 08/02/2015  . Acute hyperglycemia 08/02/2015  . Systolic hypertension with cerebrovascular disease 12/27/2014  . Epistaxis 07/28/2012  . HYPERGLYCEMIA 10/02/2010  . NEPHROLITHIASIS 05/23/2009  . BARRETTS ESOPHAGUS 02/20/2009  . Gastroparesis 02/20/2009  . RADICULOPATHY 10/21/2008  . GERD 10/08/2008  .  ESOPHAGITIS 08/15/2008  . ESOPHAGEAL STRICTURE 08/15/2008  . GASTRITIS, ACUTE 08/15/2008  . RENAL CYST 08/14/2008  . TOBACCO ABUSE 08/08/2008  . HEMATURIA, MICROSCOPIC, HX OF 08/08/2008  . PULMONARY NODULE 01/10/2008    [facility] ,MS, CCC-SLP  04/24/2020, 11:11 PM  [facility] [address] [place], [place], [address] Phone: [phone]   Fax:  [phone]   Name: Richard Vaughn MRN: [id number] Date of Birth: [birth year]

## 2020-04-26 ENCOUNTER — Other Ambulatory Visit: Payer: Self-pay | Admitting: Gastroenterology

## 2020-04-26 DIAGNOSIS — R6881 Early satiety: Secondary | ICD-10-CM

## 2020-04-28 ENCOUNTER — Encounter: Payer: Self-pay | Admitting: Occupational Therapy

## 2020-04-28 ENCOUNTER — Other Ambulatory Visit: Payer: Self-pay

## 2020-04-28 ENCOUNTER — Ambulatory Visit: Payer: Medicare Other | Admitting: Occupational Therapy

## 2020-04-28 DIAGNOSIS — R471 Dysarthria and anarthria: Secondary | ICD-10-CM | POA: Diagnosis not present

## 2020-04-28 DIAGNOSIS — M6281 Muscle weakness (generalized): Secondary | ICD-10-CM | POA: Diagnosis not present

## 2020-04-28 DIAGNOSIS — R2689 Other abnormalities of gait and mobility: Secondary | ICD-10-CM | POA: Diagnosis not present

## 2020-04-28 DIAGNOSIS — R41842 Visuospatial deficit: Secondary | ICD-10-CM

## 2020-04-28 DIAGNOSIS — R41841 Cognitive communication deficit: Secondary | ICD-10-CM | POA: Diagnosis not present

## 2020-04-28 DIAGNOSIS — R2681 Unsteadiness on feet: Secondary | ICD-10-CM | POA: Diagnosis not present

## 2020-04-28 DIAGNOSIS — R278 Other lack of coordination: Secondary | ICD-10-CM | POA: Diagnosis not present

## 2020-04-28 DIAGNOSIS — I69354 Hemiplegia and hemiparesis following cerebral infarction affecting left non-dominant side: Secondary | ICD-10-CM

## 2020-04-28 DIAGNOSIS — G8929 Other chronic pain: Secondary | ICD-10-CM

## 2020-04-28 NOTE — Therapy (Signed)
[facility] [address] [place], [place], [address] Phone: [phone]   Fax:  [phone]  Occupational Therapy Treatment  Patient Details  Name: Richard Vaughn MRN: [id number] Date of Birth: Jul 28, 1951 Referring Provider (OT): Reesa Chew, [place]   Encounter Date: 04/28/2020   OT End of Session - 04/28/20 1721    Visit Number 9    Number of Visits 25    Date for OT Re-Evaluation 06/26/20    Authorization Type MCR, BS/BS    Authorization - Visit Number 9    Authorization - Number of Visits 10    OT Start Time 1335   Pt arrived late   OT Stop Time 1414    OT Time Calculation (min) 39 min    Activity Tolerance Patient tolerated treatment well           Past Medical History:  Diagnosis Date  . Barrett's esophagus   . Chronic back pain   . Gastritis    Mild  . Gastroparesis   . GERD (gastroesophageal reflux disease)   . Headache   . Hyperglycemia   . Hypertension   . Nephrolithiasis   . Renal cyst   . Stricture and stenosis of esophagus   . Stroke ([place]) 07-2015  . Vision abnormalities     Past Surgical History:  Procedure Laterality Date  . [place] SURGERY  2012  . COLONOSCOPY    . [place] SURGERY  2005, 2010   replaced L4 and L5  . UPPER GASTROINTESTINAL ENDOSCOPY      There were no vitals filed for this visit.   Subjective Assessment - 04/28/20 1714    Pertinent History Rt thalamic hemmorhage w/ Lt hemiparesis 02/22/20. PMH: Stroke 2019 w/ Lt hemiparesis, HLD, HTN, GERD, Barretts esophagus    Limitations fall risk, no driving, Lt shoulder rotator cuff tear (see MRI results 03/14/20)    Patient Stated Goals Return to normal, driving, baking    Currently in Pain? Yes    Pain Score 5     Pain Location Shoulder   pt has rotator cuff tear   Pain Orientation Left    Pain Descriptors / Indicators Aching    Pain Type Chronic pain    Pain Onset More than a month ago    Pain Frequency  Intermittent    Aggravating Factors  certain movements    Pain Relieving Factors avoidng those movements - pt has rotator cuff tear (full tear)           Patient seen for aquatic therapy today.  Treatment took place in water 2.5-4 feet deep depending upon activity.  Pt entered the pool via steps using 2 hand railings and close supervision. Prior to entering the pool discussed with pt basic water principles as well as what pt's goals are. Pt states his primary issue right now is his balance and he wants to improve his balance so he can "get back to doing everything I was doing before."  Pt denies any SOB and reports his BP has been stable.  Addressed functional ambulation with BUE support on therapist's shoulders initially - pt with intermittent LOB, and demonstrates wide base of support, exaggerated lateral weight shifting with functional ambulation (vs lateral anterior weight shift), L bias which increases with more narrow BOS and with head turns or eyes closed.  Pt also reports that his L hip gets "tired" when he walks.  Addressed LLE proximal strengthening using ankle floatation device to increase resistance  as well completion with knee extension to increase lever arm for further resistance.  Focused on hip extension and hip abduction, 10 reps x 2 each. Pt needed min cues and min facilitation for postural alignment during activities.  Addressed backward walking along pool side with LUE support on pool wall.  Pt with increased difficulty isolating LLE hip extension (attempted to substitute via bending forward at the hips) and needed mod cues and mod facilitation for postural alignment and control.  Performance did improve with repetition and practice. Addressed gentle ROM for LUE without resistance using small dumb bells on top of the water to allow for gliding with pt supported against pool wall for chest presses and horizontal ab/adduction. Pt tolerated well and AROM improved with repetition.  Cued pt to  work within his pain limits and pt able to self monitor (pt has L rotator cuff tear which he states he will seek surgery for once he is done with stroke rehab).  Addressed functional ambulation in open water using large dumb bells for light UE support - pt needed min- mod facilitation for more anterior lateral weight shift as well as for slightly more narrow BOS.  Practiced maintaining balance with eyes closed and feet together - pt with L bias and only able to maintain balance in water for approximately 15-20 seconds.  Pt exited the pool via steps using 2 hand railings, cues for safety and sequencing and close supervision.                        OT Short Term Goals - 04/24/20 0957      OT SHORT TERM GOAL #1   Title Pt will be mod I with HEP for coordination for LUE, balance    Time 6    Period Weeks    Status Achieved      OT SHORT TERM GOAL #2   Title Pt will consistently don socks and tie shoes    Time 6    Period Weeks    Status Achieved      OT SHORT TERM GOAL #3   Title Pt will demonstrate improved coordination as evidenced by decreasing time on 9 hole peg with LUE by at least 10 seconds to assist with functional fine motor tasks.    Baseline 69.06 sec    Time 6    Period Weeks    Status Achieved   51.43     OT SHORT TERM GOAL #4   Title Pt will report overall less drops Lt hand during the day attending to Lt side consistently    Time 6    Period Weeks    Status On-going      OT SHORT TERM GOAL #5   Title Pt will perform dynamic standing tasks w/ supervision without LOB    Time 6    Period Weeks    Status On-going             OT Long Term Goals - 03/26/20 1500      OT LONG TERM GOAL #1   Title Pt will be independent with updated HEP including aquatic HEP prn    Time 12    Period Weeks    Status New      OT LONG TERM GOAL #2   Title Pt will be mod I with shower transfers    Time 12    Period Weeks    Status New      OT LONG  TERM GOAL #3     Title Pt will be mod I with hot meal prep with at least two items.    Time 12    Period Weeks    Status New      OT LONG TERM GOAL #4   Title Pt demonstrate improved coordination as evidenced by decreasing time on 9 hole peg by at least 20 seconds with LUE.    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  Pt tolerated well.    OT Occupational Profile and History Detailed Assessment- Review of Records and additional review of physical, cognitive, psychosocial history related to current functional performance    Occupational performance deficits (Please refer to evaluation for details): ADL's;IADL's;Leisure    Body Structure / Function / Physical Skills ADL;ROM;IADL;Balance;Body mechanics;Mobility;Strength;Coordination;FMC;Pain;UE functional use;Proprioception;Decreased knowledge of use of DME    Cognitive Skills Attention    Rehab Potential Good    Clinical Decision Making Several treatment options, min-mod task modification necessary    Comorbidities Affecting Occupational Performance: Presence of comorbidities impacting occupational performance    Comorbidities impacting occupational performance description: Lt rotator cuff tear    Modification or Assistance to Complete Evaluation  Min-Moderate modification of tasks or assist with assess necessary to complete eval    OT Frequency 2x / week    OT  Duration 12 weeks    OT Treatment/Interventions Self-care/ADL training;Therapeutic exercise;Functional Mobility Training;Aquatic Therapy;Neuromuscular education;Manual Therapy;Splinting;Energy conservation;Therapeutic activities;DME and/or AE instruction;Cognitive remediation/compensation;Visual/perceptual remediation/compensation;Passive range of motion;Patient/family education;Moist Heat    Plan for aquatic therapy:  address balance, postural alignment and control, activity tolerance, LUE ROM within pain tolerance.    Consulted and Agree with Plan of Care Patient;Family member/caregiver    Family Member Consulted wife           Patient will benefit from skilled therapeutic intervention in order to improve the following deficits and impairments:   Body Structure / Function / Physical Skills: ADL, ROM, IADL, Balance, Body mechanics, Mobility, Strength, Coordination, FMC, Pain, UE functional use, Proprioception, Decreased knowledge of use of DME Cognitive Skills: Attention     Visit Diagnosis: Other lack of coordination  Hemiplegia and hemiparesis following cerebral infarction affecting left non-dominant side (HCC)  Muscle weakness (generalized)  Visuospatial deficit  Unsteadiness on feet  Chronic left shoulder pain    Problem List Patient Active Problem List   Diagnosis Date Noted  . Depressive reaction   . Sleep disturbance   . Urinary retention   . Bradycardia   . Essential hypertension   . Temperature elevated   . Left thyroid nodule   . Treatment-emergent central sleep apnea  10/25/2018  . 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 Chronic pain syndrome   . Chronic bilateral low back pain   . Benign essential HTN   . Tobacco abuse   . Marijuana abuse   . Hyperlipidemia   . History of CVA with residual deficit   . Dysphagia, post-stroke   . ICH (intracerebral hemorrhage) (HCC) - R thalmic/PLIC d/t HTN 08/31/5101  . Insomnia  07/13/2017  . PAD (peripheral artery disease) ([place]) 07/13/2017  . Hyperlipidemia with target LDL less than 70 07/13/2017  . Stroke-like symptom 09/22/2016  . Paresthesia 09/22/2016  . CVA (cerebral vascular accident) ([place]) 09/22/2016  . Cerebrovascular accident (CVA) due to thrombosis of left carotid artery ([place]) 09/23/2015  . Primary snoring 09/23/2015  . Hypersomnia with sleep apnea 09/23/2015  . Obstructive sleep apnea 09/23/2015  . Lacunar infarct, acute ([place]) 08/25/2015  . Sleep apnea 08/25/2015  . Dysarthria   . CVD (cardiovascular disease) 08/02/2015  . Acute hyperglycemia 08/02/2015  . Systolic hypertension with cerebrovascular disease 12/27/2014  . Epistaxis 07/28/2012  . HYPERGLYCEMIA 10/02/2010  . NEPHROLITHIASIS 05/23/2009  . BARRETTS ESOPHAGUS 02/20/2009  . Gastroparesis 02/20/2009  . RADICULOPATHY 10/21/2008  . GERD 10/08/2008  . ESOPHAGITIS 08/15/2008  . ESOPHAGEAL STRICTURE 08/15/2008  . GASTRITIS, ACUTE 08/15/2008  . RENAL CYST 08/14/2008  . TOBACCO ABUSE 08/08/2008  . HEMATURIA, MICROSCOPIC, HX OF 08/08/2008  . PULMONARY NODULE 01/10/2008    Quay Burow, OTR/L 04/28/2020, 5:24 PM  [facility] [address] [place], [place], [address] Phone: [phone]   Fax:  [phone]  Name: Richard Vaughn MRN: [id number] Date of Birth: [birth year]

## 2020-04-29 ENCOUNTER — Ambulatory Visit: Payer: Medicare Other

## 2020-04-29 ENCOUNTER — Ambulatory Visit: Payer: Medicare Other | Admitting: Speech Pathology

## 2020-04-29 DIAGNOSIS — M6281 Muscle weakness (generalized): Secondary | ICD-10-CM | POA: Diagnosis not present

## 2020-04-29 DIAGNOSIS — R278 Other lack of coordination: Secondary | ICD-10-CM

## 2020-04-29 DIAGNOSIS — R471 Dysarthria and anarthria: Secondary | ICD-10-CM | POA: Diagnosis not present

## 2020-04-29 DIAGNOSIS — R2681 Unsteadiness on feet: Secondary | ICD-10-CM

## 2020-04-29 DIAGNOSIS — R2689 Other abnormalities of gait and mobility: Secondary | ICD-10-CM

## 2020-04-29 DIAGNOSIS — R41841 Cognitive communication deficit: Secondary | ICD-10-CM | POA: Diagnosis not present

## 2020-04-29 NOTE — Therapy (Signed)
[facility] [address] [place], [place], [address] Phone: [phone]   Fax:  [phone]  Physical Therapy Treatment  Patient Details  Name: Richard Vaughn MRN: [id number] Date of Birth: 1951-08-01 Referring Provider (PT): Reesa Chew PA-C   Encounter Date: 04/29/2020   PT End of Session - 04/29/20 1027    Visit Number 13    Number of Visits 22    Date for PT Re-Evaluation 06/16/20   POC for 6 weeks, Cert for 90 days   Authorization Type Medicare + BCBS (10th visit PN)    Progress Note Due on Visit 10    PT Start Time 1023   pt arriving late   PT Stop Time 1100    PT Time Calculation (min) 37 min    Equipment Utilized During Treatment Gait belt    Activity Tolerance Patient tolerated treatment well    Behavior During Therapy WFL for tasks assessed/performed           Past Medical History:  Diagnosis Date   Barrett's esophagus    Chronic back pain    Gastritis    Mild   Gastroparesis    GERD (gastroesophageal reflux disease)    Headache    Hyperglycemia    Hypertension    Nephrolithiasis    Renal cyst    Stricture and stenosis of esophagus    Stroke [facility]) 07-2015   Vision abnormalities     Past Surgical History:  Procedure Laterality Date   CERVICAL DISC SURGERY  2012   COLONOSCOPY     LUMBAR [place] SURGERY  2005, 2010   replaced L4 and L5   UPPER GASTROINTESTINAL ENDOSCOPY      There were no vitals filed for this visit.   Subjective Assessment - 04/29/20 1025    Subjective Patient reports that the pool went well. Reports he tried to lean forward enough with sit to stands at home and it worked well.    Patient is accompained by: Family member   Wife   Pertinent History Chronic Back Pain, Barrett's Esophagus, GERD, Hyperglycemia, HTN, CVA, Hyperlipidemia, vision abnormalities, ICH in the right thalamus/posterior limb of internal capsule    Limitations Standing;Walking;House  hold activities    Patient Stated Goals "want to walk", "get back to how I was"    Currently in Pain? Yes    Pain Score 3     Pain Location Back    Pain Orientation Lower    Pain Descriptors / Indicators Aching    Pain Type Chronic pain    Pain Onset More than a month ago                             [facility] Adult PT Treatment/Exercise - 04/29/20 0001      Transfers   Transfers Sit to Stand;Stand to Sit    Sit to Stand 5: Supervision    Sit to Stand Details (indicate cue type and reason) require once instance of verbal cues for forward lean. Overall demo good carryover throughout session.    Stand to Sit 5: Supervision      Ambulation/Gait   Ambulation/Gait Yes    Ambulation/Gait Assistance 5: Supervision;4: Min guard    Ambulation/Gait Assistance Details Completed gait training x 750 ft without AD around therapy gym, verbal cues for step length and improved arm swing. Completed gait training x 395 with obstacle course including negotating around cones and stepovers with  hurdles. Pt require verbal cues for improved step over and negotiating over obstacles at times. Intermittent CGA as needed throughout.     Ambulation Distance (Feet) 1095 Feet    Assistive device None    Gait Pattern Step-through pattern;Decreased hip/knee flexion - left    Ambulation Surface Level;Indoor      High Level Balance   High Level Balance Activities Tandem walking;Marching forwards;Backward walking    High Level Balance Comments Completed x 5 laps of tandem walking, backwards walking, and marching. PT providing manual faciliation for weight shift with marching. Completed all initially with light UE support from // bars and progressed to no UE support.       Neuro Re-ed    Neuro Re-ed Details  Completed forward step overs, over black beam, 1 x 15 reps. Progressed to completing reciprocal stepping over 2 black beams with no UE support. PT providing verbal cues for foot placement and ensuring  clearance of LLE.                     PT Short Term Goals - 04/17/20 1346      PT SHORT TERM GOAL #1   Title Pt will ambulate >600' on level surfaces mod I for improved mobility.    Time 3    Period Weeks    Status New    Target Date 05/08/20      PT SHORT TERM GOAL #2   Title Pt will decrease 5x sit to stand from 24 sec to <22 sec for improved balance and functional strength.    Time 3    Period Weeks    Status Revised    Target Date 05/08/20             PT Long Term Goals - 04/17/20 1352      PT LONG TERM GOAL #1   Title Patient will be independent with final HEP    Baseline PT continues to add to HEP    Time 6    Period Weeks    Status On-going    Target Date 05/29/20      PT LONG TERM GOAL #2   Title Pt will increase Berg from 44/56 to 47/56 or more for improved balance and decreased fall risk.    Baseline 36/56, 04/17/20 44/56    Time 6    Period Weeks    Status New    Target Date 05/29/20      PT LONG TERM GOAL #3   Title Pt will improve gait speed to >/=2.0 ft/sec. with LRAD to safely amb. in the community.    Baseline 1.36 ft    Time 6    Period Weeks    Status On-going    Target Date 05/29/20      PT LONG TERM GOAL #4   Title Pt will ambulate >800' on varies surfaces supervision for improved community mobility.    Time 6    Period Weeks    Status Revised    Target Date 05/29/20                 Plan - 04/29/20 1102    Clinical Impression Statement Today's skilled PT session included continued gait training without AD and worked on dynamic gait with negotating around and over obstacles. OVerall pt demo good stability, only require intermittent CGA with completion. Completed high level balance activities as tolerated by patient. Pt will continue to benefit from skilled PT services to progress toward  all unmet goals.    Personal Factors and Comorbidities Comorbidity 3+    Comorbidities Chronic Back Pain, Barrett's Esophagus, GERD,  Hyperglycemia, HTN, CVA, Hyperlipidemia, vision abnormalities, ICH in the right thalamus/posterior limb of internal capsule    Examination-Activity Limitations Bed Mobility;Transfers;Stairs;Stand    Examination-Participation Restrictions Yard Work;Community Activity    Stability/Clinical Decision Making Evolving/Moderate complexity    Rehab Potential Good    PT Frequency 2x / week    PT Duration 6 weeks    PT Treatment/Interventions ADLs/Self Care Home Management;Electrical Stimulation;Moist Heat;Cryotherapy;Gait training;Stair training;Functional mobility training;Therapeutic activities;DME Instruction;Therapeutic exercise;Balance training;Neuromuscular re-education;Patient/family education;Orthotic Fit/Training;Passive range of motion;Visual/perceptual remediation/compensation    PT Next Visit Plan Incorporate more aerobic activity. Continue gait and transfer training with Rollator. Focus on improving left foot clearance. Strengthening exercises adding in more left hip extension, Standing Balance.    Consulted and Agree with Plan of Care Patient;Family member/caregiver    Family Member Consulted Wife           Patient will benefit from skilled therapeutic intervention in order to improve the following deficits and impairments:  Abnormal gait, Decreased balance, Decreased endurance, Decreased mobility, Difficulty walking, Impaired vision/preception, Decreased knowledge of precautions, Decreased strength, Decreased safety awareness, Decreased knowledge of use of DME, Decreased activity tolerance  Visit Diagnosis: Other lack of coordination  Muscle weakness (generalized)  Unsteadiness on feet  Other abnormalities of gait and mobility     Problem List Patient Active Problem List   Diagnosis Date Noted   Depressive reaction    Sleep disturbance    Urinary retention    Bradycardia    Essential hypertension    Temperature elevated    Left thyroid nodule     Treatment-emergent central sleep apnea 10/25/2018   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) 10/25/2018   Chronic pain syndrome    Chronic bilateral low back pain    Benign essential HTN    Tobacco abuse    Marijuana abuse    Hyperlipidemia    History of CVA with residual deficit    Dysphagia, post-stroke    ICH (intracerebral hemorrhage) ([place]) - R thalmic/PLIC d/t HTN 63/84/6659   Insomnia 07/13/2017   PAD (peripheral artery disease) ([place]) 07/13/2017   Hyperlipidemia with target LDL less than 70 07/13/2017   Stroke-like symptom 09/22/2016   Paresthesia 09/22/2016   CVA (cerebral vascular accident) ([place]) 09/22/2016   Cerebrovascular accident (CVA) due to thrombosis of left carotid artery ([place]) 09/23/2015   Primary snoring 09/23/2015   Hypersomnia with sleep apnea 09/23/2015   Obstructive sleep apnea 09/23/2015   Lacunar infarct, acute ([place]) 08/25/2015   Sleep apnea 08/25/2015   Dysarthria    CVD (cardiovascular disease) 08/02/2015   Acute hyperglycemia 93/57/0177   Systolic hypertension with cerebrovascular disease 12/27/2014   Epistaxis 07/28/2012   HYPERGLYCEMIA 10/02/2010   NEPHROLITHIASIS 05/23/2009   BARRETTS ESOPHAGUS 02/20/2009   Gastroparesis 02/20/2009   RADICULOPATHY 10/21/2008   GERD 10/08/2008   ESOPHAGITIS 08/15/2008   ESOPHAGEAL STRICTURE 08/15/2008   GASTRITIS, ACUTE 08/15/2008   RENAL CYST 08/14/2008   TOBACCO ABUSE 08/08/2008   HEMATURIA, MICROSCOPIC, HX OF 08/08/2008   PULMONARY NODULE 01/10/2008    Jones Bales, PT, DPT 04/29/2020, 2:13 PM  [facility] [address] [place], [place], [address] Phone: [phone]   Fax:  [phone]  Name: Richard Vaughn MRN: [id number] Date of Birth: 08-24-1951

## 2020-05-01 ENCOUNTER — Ambulatory Visit: Payer: Medicare Other | Admitting: Speech Pathology

## 2020-05-01 ENCOUNTER — Ambulatory Visit: Payer: Medicare Other

## 2020-05-05 ENCOUNTER — Encounter: Payer: Self-pay | Admitting: Occupational Therapy

## 2020-05-05 ENCOUNTER — Other Ambulatory Visit: Payer: Self-pay

## 2020-05-05 ENCOUNTER — Ambulatory Visit: Payer: Medicare Other | Admitting: Occupational Therapy

## 2020-05-05 DIAGNOSIS — R41842 Visuospatial deficit: Secondary | ICD-10-CM

## 2020-05-05 DIAGNOSIS — I69354 Hemiplegia and hemiparesis following cerebral infarction affecting left non-dominant side: Secondary | ICD-10-CM

## 2020-05-05 DIAGNOSIS — R278 Other lack of coordination: Secondary | ICD-10-CM | POA: Diagnosis not present

## 2020-05-05 DIAGNOSIS — G8929 Other chronic pain: Secondary | ICD-10-CM

## 2020-05-05 DIAGNOSIS — R2681 Unsteadiness on feet: Secondary | ICD-10-CM | POA: Diagnosis not present

## 2020-05-05 DIAGNOSIS — M6281 Muscle weakness (generalized): Secondary | ICD-10-CM

## 2020-05-05 DIAGNOSIS — R293 Abnormal posture: Secondary | ICD-10-CM

## 2020-05-05 DIAGNOSIS — R2689 Other abnormalities of gait and mobility: Secondary | ICD-10-CM | POA: Diagnosis not present

## 2020-05-05 DIAGNOSIS — R41841 Cognitive communication deficit: Secondary | ICD-10-CM | POA: Diagnosis not present

## 2020-05-05 DIAGNOSIS — R471 Dysarthria and anarthria: Secondary | ICD-10-CM | POA: Diagnosis not present

## 2020-05-05 NOTE — Therapy (Addendum)
[facility] [address] [place], [place], [address] Phone: [phone]   Fax:  [phone]  Occupational Therapy Treatment  Patient Details  Name: Richard Vaughn MRN: [id number] Date of Birth: 23-Jun-1951 Referring Provider (OT): Reesa Chew, [place]   Encounter Date: 05/05/2020   OT End of Session - 05/05/20 1854    Visit Number 10    Number of Visits 25    Date for OT Re-Evaluation 06/26/20    Authorization Type MCR, BS/BS    Authorization - Visit Number 10    Authorization - Number of Visits 20    Progress Note Due on Visit 18    OT Start Time 1607   pt arrived late   OT Stop Time 1415    OT Time Calculation (min) 37 min    Activity Tolerance Patient tolerated treatment well           Past Medical History:  Diagnosis Date  . Barrett's esophagus   . Chronic back pain   . Gastritis    Mild  . Gastroparesis   . GERD (gastroesophageal reflux disease)   . Headache   . Hyperglycemia   . Hypertension   . Nephrolithiasis   . Renal cyst   . Stricture and stenosis of esophagus   . Stroke ([place]) 07-2015  . Vision abnormalities     Past Surgical History:  Procedure Laterality Date  . [place] SURGERY  2012  . COLONOSCOPY    . [place] SURGERY  2005, 2010   replaced L4 and L5  . UPPER GASTROINTESTINAL ENDOSCOPY      There were no vitals filed for this visit.   Subjective Assessment - 05/05/20 1851    Subjective  I am feeling tired today    Patient is accompanied by: Family member   mom   Pertinent History Rt thalamic hemmorhage w/ Lt hemiparesis 02/22/20. PMH: Stroke 2019 w/ Lt hemiparesis, HLD, HTN, GERD, Barretts esophagus    Limitations fall risk, no driving, Lt shoulder rotator cuff tear (see MRI results 03/14/20)    Patient Stated Goals Return to normal, driving, baking    Currently in Pain? No/denies              Patient seen for aquatic therapy today.  Treatment took place in water  2.5-4 feet deep depending upon activity.  Pt entered the pool via ramp using 1 hand railing and supervision.  Pt reported he felt tired today but wanted to work in therapy.  Addressed deep breathing via squatting to chest level to use hydrostatic pressure to increase resistance for intercostal muscles followed by request for pt to speak loudly after inhalation.  Addressed this intermittently during the session.  Addressed dynamic standing balance in open water via use of large dumb bells for light UE support with emphasis on narrowing BOS, forward translation of weight (vs lateral), reducing bias, and use of increased more normalized speed to further challenge dynamic balance.  Transitioned into supine using floatation devices.  Utilized [place] techniques to improve lower trunk on upper trunk dissociation, trunk rotation and relaxation of trunk and extremities.  Initially pt fearful of position however with increased time in supine pt able to tolerate and relax into position.  Also in supine addressed LLE strengthening for hip extension and hip abduction - pt with improved ability in this position to maintain knee extension for longer lever arm and increased resistance.  Transitioned back into standing and also addressed LLE strengthening  in standing using light (tan) resistance band.  Pt exited pool via ramp using 1 hand railing and supervision.                      OT Short Term Goals - 04/24/20 0957      OT SHORT TERM GOAL #1   Title Pt will be mod I with HEP for coordination for LUE, balance    Time 6    Period Weeks    Status Achieved      OT SHORT TERM GOAL #2   Title Pt will consistently don socks and tie shoes    Time 6    Period Weeks    Status Achieved      OT SHORT TERM GOAL #3   Title Pt will demonstrate improved coordination as evidenced by decreasing time on 9 hole peg with LUE by at least 10 seconds to assist with functional fine motor tasks.    Baseline 69.06  sec    Time 6    Period Weeks    Status Achieved   51.43     OT SHORT TERM GOAL #4   Title Pt will report overall less drops Lt hand during the day attending to Lt side consistently    Time 6    Period Weeks    Status On-going      OT SHORT TERM GOAL #5   Title Pt will perform dynamic standing tasks w/ supervision without LOB    Time 6    Period Weeks    Status On-going             OT Long Term Goals - 03/26/20 1500      OT LONG TERM GOAL #1   Title Pt will be independent with updated HEP including aquatic HEP prn    Time 12    Period Weeks    Status New      OT LONG TERM GOAL #2   Title Pt will be mod I with shower transfers    Time 12    Period Weeks    Status New      OT LONG TERM GOAL #3   Title Pt will be mod I with hot meal prep with at least two items.    Time 12    Period Weeks    Status New      OT LONG TERM GOAL #4   Title Pt demonstrate improved coordination as evidenced by decreasing time on 9 hole peg by at least 20 seconds with LUE.    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    Time 12    Period Weeks    Status New      Long Term Additional Goals   Additional Long Term Goals Yes      OT LONG TERM GOAL #6   Title Pt will perform environmental scanning during ambulation with simple physical task at 90% or greater accuracy    Time 12    Period Weeks    Status New                 Plan - 05/05/20 1853    OT Occupational Profile and History Detailed Assessment- Review of  Records and additional review of physical, cognitive, psychosocial history related to current functional performance    Occupational performance deficits (Please refer to evaluation for details): ADL's;IADL's;Leisure    Body Structure / Function / Physical Skills ADL;ROM;IADL;Balance;Body  mechanics;Mobility;Strength;Coordination;FMC;Pain;UE functional use;Proprioception;Decreased knowledge of use of DME    Cognitive Skills Attention    Rehab Potential Good    Clinical Decision Making Several treatment options, min-mod task modification necessary    Comorbidities Affecting Occupational Performance: Presence of comorbidities impacting occupational performance    Comorbidities impacting occupational performance description: Lt rotator cuff tear    Modification or Assistance to Complete Evaluation  Min-Moderate modification of tasks or assist with assess necessary to complete eval    OT Frequency 2x / week    OT Duration 12 weeks    OT Treatment/Interventions Self-care/ADL training;Therapeutic exercise;Functional Mobility Training;Aquatic Therapy;Neuromuscular education;Manual Therapy;Splinting;Energy conservation;Therapeutic activities;DME and/or AE instruction;Cognitive remediation/compensation;Visual/perceptual remediation/compensation;Passive range of motion;Patient/family education;Moist Heat    Plan for aquatic therapy:  address balance, postural alignment and control, activity tolerance, LUE ROM within pain tolerance.    Consulted and Agree with Plan of Care Patient;Family member/caregiver    Family Member Consulted wife           Patient will benefit from skilled therapeutic intervention in order to improve the following deficits and impairments:   Body Structure / Function / Physical Skills: ADL, ROM, IADL, Balance, Body mechanics, Mobility, Strength, Coordination, FMC, Pain, UE functional use, Proprioception, Decreased knowledge of use of DME Cognitive Skills: Attention     Visit Diagnosis: Other lack of coordination  Muscle weakness (generalized)  Unsteadiness on feet  Hemiplegia and hemiparesis following cerebral infarction affecting left non-dominant side (HCC)  Visuospatial deficit  Chronic left shoulder pain  Abnormal posture    Problem  List Patient Active Problem List   Diagnosis Date Noted  . Depressive reaction   . Sleep disturbance   . Urinary retention   . Bradycardia   . Essential hypertension   . Temperature elevated   . Left thyroid nodule   . Treatment-emergent central sleep apnea 10/25/2018  . 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 Chronic pain syndrome   . Chronic bilateral low back pain   . Benign essential HTN   . Tobacco abuse   . Marijuana abuse   . Hyperlipidemia   . History of CVA with residual deficit   . Dysphagia, post-stroke   . ICH (intracerebral hemorrhage) ([facility]) - R thalmic/PLIC d/t HTN 01/74/9449  . Insomnia 07/13/2017  . PAD (peripheral artery disease) ([place]) 07/13/2017  . Hyperlipidemia with target LDL less than 70 07/13/2017  . Stroke-like symptom 09/22/2016  . Paresthesia 09/22/2016  . CVA (cerebral vascular accident) ([place]) 09/22/2016  . Cerebrovascular accident (CVA) due to thrombosis of left carotid artery ([place]) 09/23/2015  . Primary snoring 09/23/2015  . Hypersomnia with sleep apnea 09/23/2015  . Obstructive sleep apnea 09/23/2015  . Lacunar infarct, acute ([place]) 08/25/2015  . Sleep apnea 08/25/2015  . Dysarthria   . CVD (cardiovascular disease) 08/02/2015  . Acute hyperglycemia 08/02/2015  . Systolic hypertension with cerebrovascular disease 12/27/2014  . Epistaxis 07/28/2012  . HYPERGLYCEMIA 10/02/2010  . NEPHROLITHIASIS 05/23/2009  . BARRETTS ESOPHAGUS 02/20/2009  . Gastroparesis 02/20/2009  . RADICULOPATHY 10/21/2008  . GERD 10/08/2008  . ESOPHAGITIS 08/15/2008  . ESOPHAGEAL STRICTURE 08/15/2008  . GASTRITIS, ACUTE 08/15/2008  . RENAL CYST 08/14/2008  . TOBACCO ABUSE 08/08/2008  . HEMATURIA, MICROSCOPIC, HX OF 08/08/2008  . PULMONARY NODULE 01/10/2008  Occupational Therapy Progress Note  Dates of Reporting Period: 03/26/2020 to 05/05/2020  Objective Reports of Subjective Statement: see above  Objective Measurements: see  above  Goal Update: see above  Plan: see above  Reason Skilled Services are Required: see above  Quay Burow, OTR/L 05/05/2020, 6:56 PM  [facility] [address] [place], [place], [address] Phone: [phone]   Fax:  [phone]  Name: KWADWO TARAS MRN: [id number] Date of Birth: 08/03/1951

## 2020-05-06 ENCOUNTER — Ambulatory Visit: Payer: Medicare Other

## 2020-05-06 ENCOUNTER — Ambulatory Visit: Payer: Medicare Other | Admitting: Speech Pathology

## 2020-05-06 ENCOUNTER — Ambulatory Visit: Payer: Medicare Other | Admitting: Occupational Therapy

## 2020-05-06 ENCOUNTER — Telehealth: Payer: Self-pay | Admitting: Gastroenterology

## 2020-05-06 DIAGNOSIS — R41841 Cognitive communication deficit: Secondary | ICD-10-CM | POA: Diagnosis not present

## 2020-05-06 DIAGNOSIS — R2681 Unsteadiness on feet: Secondary | ICD-10-CM | POA: Diagnosis not present

## 2020-05-06 DIAGNOSIS — I69354 Hemiplegia and hemiparesis following cerebral infarction affecting left non-dominant side: Secondary | ICD-10-CM

## 2020-05-06 DIAGNOSIS — M6281 Muscle weakness (generalized): Secondary | ICD-10-CM

## 2020-05-06 DIAGNOSIS — R278 Other lack of coordination: Secondary | ICD-10-CM

## 2020-05-06 DIAGNOSIS — R2689 Other abnormalities of gait and mobility: Secondary | ICD-10-CM | POA: Diagnosis not present

## 2020-05-06 DIAGNOSIS — R6881 Early satiety: Secondary | ICD-10-CM

## 2020-05-06 DIAGNOSIS — R471 Dysarthria and anarthria: Secondary | ICD-10-CM

## 2020-05-06 MED ORDER — ESOMEPRAZOLE MAGNESIUM 40 MG PO CPDR: 40.0000 mg | DELAYED_RELEASE_CAPSULE | Freq: Two times a day (BID) | ORAL | 1 refills | Status: DC

## 2020-05-06 NOTE — Telephone Encounter (Signed)
Script sent to pharmacy.

## 2020-05-06 NOTE — Therapy (Signed)
[facility] [address] [place], [place], [address] Phone: [phone]   Fax:  [phone]  Speech Language Pathology Treatment  Patient Details  Name: Richard Vaughn MRN: [id number] Date of Birth: 24-Nov-1950 Referring Provider (SLP): Reesa Chew (referral), [place], Betty, MD (documentation)   Encounter Date: 05/06/2020   End of Session - 05/06/20 1249    Visit Number 9    Number of Visits 17    Date for SLP Re-Evaluation 06/10/20    SLP Start Time 0932    SLP Stop Time  4166    SLP Time Calculation (min) 43 min    Activity Tolerance Patient tolerated treatment well           Past Medical History:  Diagnosis Date  . Barrett's esophagus   . Chronic back pain   . Gastritis    Mild  . Gastroparesis   . GERD (gastroesophageal reflux disease)   . Headache   . Hyperglycemia   . Hypertension   . Nephrolithiasis   . Renal cyst   . Stricture and stenosis of esophagus   . Stroke ([place]) 07-2015  . Vision abnormalities     Past Surgical History:  Procedure Laterality Date  . [place] SURGERY  2012  . COLONOSCOPY    . [place] SURGERY  2005, 2010   replaced L4 and L5  . UPPER GASTROINTESTINAL ENDOSCOPY      There were no vitals filed for this visit.   Subjective Assessment - 05/06/20 0939    Subjective "It's going, not bad." (re: practice)    Currently in Pain? Yes    Pain Score 3     Pain Location Back    Pain Orientation Lower    Pain Descriptors / Indicators Aching                 ADULT SLP TREATMENT - 05/06/20 0940      General Information   Behavior/Cognition Alert;Cooperative;Pleasant mood      Treatment Provided   Treatment provided Cognitive-Linquistic      Cognitive-Linquistic Treatment   Treatment focused on Dysarthria    Skilled Treatment Patient reports practice with HEP once per day. He is using habit tracker given 2 sessions ago and reports this has been helpful,  but did not bring it with him today. He required mod cues today with abdominal breathing at rest; tactile cues necessary to reduce chest/shoulder movement. SLP elevated pt's feet and able to fade cues to occasional min A for ~85% accuracy at rest, semi-reclined. Encouraged pt to practice AB in reclined posture initially before sitting upright. For phrase level tasks with focus on loud speech, pt used abdominal breathing most of the time. Progressed to sentences, however with longer sentences pt required usual mod-max cues for appropriate breath groups.      Assessment / Recommendations / Plan   Plan Continue with current plan of care      Progression Toward Goals   Progression toward goals Progressing toward goals              SLP Short Term Goals - 05/06/20 0942      SLP SHORT TERM GOAL #1   Title pt will improve emergent awareness with simple functional tasks to requiring min A rarely over three sessions    Period --   or 9 initital sessions for all STGs   Status Not Met      SLP SHORT TERM GOAL #2   Title  pt will demo compensations for dysarthria in simple sentence responses generating at lest 90% intelligibility over 3 sessions    Baseline 04/17/20    Status Partially Met      SLP SHORT TERM GOAL #3   Title pt will demonstrate sustained attention for 15 minutes in mod complex tasks in 3 sessions    Status Not Met      SLP SHORT TERM GOAL #4   Title Pt will complete HEP for unintelligible speech with occaisonal mod A for articulation and loudnes    Baseline 04/17/20    Status Achieved            SLP Long Term Goals - 05/06/20 0942      SLP LONG TERM GOAL #1   Title pt will demo selective attention to complete simple functional tasks in min noisy environment for 10 minutes    Time 4    Period Weeks   or 17 total sessions for all LTGs   Status On-going      SLP LONG TERM GOAL #2   Title pt will demo anticipatory awareness with mod complex tasks by double checkng work 100%  of the time over three sessions    Time 4    Period Weeks    Status On-going      SLP LONG TERM GOAL #3   Title pt will demo speech volume in 8 minutes simple complex conversation, in low 70s dB average over three consecutive sessions    Time 4    Period Weeks    Status On-going      SLP LONG TERM GOAL #4   Title pt will produce 10 minutes intelligible simple-mod complex conversation using compensations, with rare min A over 3 consecutive sessions    Time 4    Period Weeks    Status On-going      SLP LONG TERM GOAL #5   Title Pt will complete HEP for unintelligible speech with occaisonal min A for articulation and loudness    Time 4    Period Weeks    Status On-going            Plan - 05/06/20 1250    Clinical Impression Statement Pt with some mild premorbid dysarthria. He presents today with mild-mod cognitive communication deficits characterized by decr'd attention and awareness, as well as significant dysarthria decreasing pt's speech intelligbility at the sentence and conversation levels. Patient reports he is now practicing HEP (once vs twice daily). Pt would benefit from skilled ST targeting cognitive linguistics as well as dysarthria.    Speech Therapy Frequency 2x / week    Duration --   8 weeks or 17 total sessions   Treatment/Interventions Oral motor exercises;Compensatory techniques;Functional tasks;SLP instruction and feedback;Patient/family education;Internal/external aids;Cueing hierarchy;Cognitive reorganization    Potential to Achieve Goals Good    Potential Considerations Severity of impairments           Patient will benefit from skilled therapeutic intervention in order to improve the following deficits and impairments:   Dysarthria and anarthria  Cognitive communication deficit    Problem List Patient Active Problem List   Diagnosis Date Noted  . Depressive reaction   . Sleep disturbance   . Urinary retention   . Bradycardia   . Essential  hypertension   . Temperature elevated   . Left thyroid nodule   . Treatment-emergent central sleep apnea 10/25/2018  . 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 Chronic pain syndrome   .  Chronic bilateral low back pain   . Benign essential HTN   . Tobacco abuse   . Marijuana abuse   . Hyperlipidemia   . History of CVA with residual deficit   . Dysphagia, post-stroke   . ICH (intracerebral hemorrhage) (HCC) - R thalmic/PLIC d/t HTN 90/30/1499  . Insomnia 07/13/2017  . PAD (peripheral artery disease) ([place]) 07/13/2017  . Hyperlipidemia with target LDL less than 70 07/13/2017  . Stroke-like symptom 09/22/2016  . Paresthesia 09/22/2016  . CVA (cerebral vascular accident) ([place]) 09/22/2016  . Cerebrovascular accident (CVA) due to thrombosis of left carotid artery ([place]) 09/23/2015  . Primary snoring 09/23/2015  . Hypersomnia with sleep apnea 09/23/2015  . Obstructive sleep apnea 09/23/2015  . Lacunar infarct, acute ([place]) 08/25/2015  . Sleep apnea 08/25/2015  . Dysarthria   . CVD (cardiovascular disease) 08/02/2015  . Acute hyperglycemia 08/02/2015  . Systolic hypertension with cerebrovascular disease 12/27/2014  . Epistaxis 07/28/2012  . HYPERGLYCEMIA 10/02/2010  . NEPHROLITHIASIS 05/23/2009  . BARRETTS ESOPHAGUS 02/20/2009  . Gastroparesis 02/20/2009  . RADICULOPATHY 10/21/2008  . GERD 10/08/2008  . ESOPHAGITIS 08/15/2008  . ESOPHAGEAL STRICTURE 08/15/2008  . GASTRITIS, ACUTE 08/15/2008  . RENAL CYST 08/14/2008  . TOBACCO ABUSE 08/08/2008  . HEMATURIA, MICROSCOPIC, HX OF 08/08/2008  . PULMONARY NODULE 01/10/2008   Deneise Lever, [place], [place] Speech-Language Pathologist  Aliene Altes 05/06/2020, 12:52 PM  [facility] [address] [place], [place], [address] Phone: [phone]   Fax:  [phone]   Name: Richard Vaughn MRN: [id number] Date of Birth: 05-01-1951

## 2020-05-06 NOTE — Therapy (Signed)
[facility] [address] [place], [place], [address] Phone: [phone]   Fax:  [phone]  Occupational Therapy Treatment  Patient Details  Name: Richard Vaughn MRN: [id number] Date of Birth: 1951-04-15 Referring Provider (OT): Reesa Chew, [place]   Encounter Date: 05/06/2020   OT End of Session - 05/06/20 1242    Visit Number 11    Number of Visits 25    Date for OT Re-Evaluation 06/26/20    Authorization Type MCR, BS/BS    Authorization - Visit Number 11    Authorization - Number of Visits 20    Progress Note Due on Visit 20    OT Start Time 1015    OT Stop Time 1100    OT Time Calculation (min) 45 min    Activity Tolerance Patient tolerated treatment well    Behavior During Therapy [facility] for tasks assessed/performed           Past Medical History:  Diagnosis Date  . Barrett's esophagus   . Chronic back pain   . Gastritis    Mild  . Gastroparesis   . GERD (gastroesophageal reflux disease)   . Headache   . Hyperglycemia   . Hypertension   . Nephrolithiasis   . Renal cyst   . Stricture and stenosis of esophagus   . Stroke ([place]) 07-2015  . Vision abnormalities     Past Surgical History:  Procedure Laterality Date  . [place] SURGERY  2012  . COLONOSCOPY    . [place] SURGERY  2005, 2010   replaced L4 and L5  . UPPER GASTROINTESTINAL ENDOSCOPY      There were no vitals filed for this visit.   Subjective Assessment - 05/06/20 1023    Pertinent History Rt thalamic hemmorhage w/ Lt hemiparesis 02/22/20. PMH: Stroke 2019 w/ Lt hemiparesis, HLD, HTN, GERD, Barretts esophagus    Limitations fall risk, no driving, Lt shoulder rotator cuff tear (see MRI results 03/14/20)    Patient Stated Goals Return to normal, driving, baking    Currently in Pain? Yes    Pain Score 3     Pain Location Back    Pain Orientation Lower    Pain Descriptors / Indicators Aching    Pain Type Chronic pain    Pain  Onset More than a month ago    Pain Frequency Intermittent    Aggravating Factors  certain movements    Pain Relieving Factors avoiding those movements          Supine: performing shoulder flexion (from HEP) with modifications to prevent pain - after some trial and error, pt did best starting from elbow flexion up to higher shoulder flexion (diagonal pattern) vs. Chest press to sh flexion. Pt able to perform this way without pain. Pt tolerating remaining ex's from HEP well w/ no pain. Worked on this motion both supine and seated w/ cues to prevent compensations in abd and IR, as well as isolating forearm pronation without compromising shoulder.  Progressed to functional reaching (avoiding mid range painful arc of motion) for higher and lower level reaching to retrieve and replace clothespins on antenna while standing.  Seated: Worked on coordination placing medium sized pegs in pegboard Lt hand w/ min difficulty.                         OT Short Term Goals - 04/24/20 0957      OT SHORT TERM GOAL #1  Title Pt will be mod I with HEP for coordination for LUE, balance    Time 6    Period Weeks    Status Achieved      OT SHORT TERM GOAL #2   Title Pt will consistently don socks and tie shoes    Time 6    Period Weeks    Status Achieved      OT SHORT TERM GOAL #3   Title Pt will demonstrate improved coordination as evidenced by decreasing time on 9 hole peg with LUE by at least 10 seconds to assist with functional fine motor tasks.    Baseline 69.06 sec    Time 6    Period Weeks    Status Achieved   51.43     OT SHORT TERM GOAL #4   Title Pt will report overall less drops Lt hand during the day attending to Lt side consistently    Time 6    Period Weeks    Status On-going      OT SHORT TERM GOAL #5   Title Pt will perform dynamic standing tasks w/ supervision without LOB    Time 6    Period Weeks    Status On-going             OT Long Term Goals -  03/26/20 1500      OT LONG TERM GOAL #1   Title Pt will be independent with updated HEP including aquatic HEP prn    Time 12    Period Weeks    Status New      OT LONG TERM GOAL #2   Title Pt will be mod I with shower transfers    Time 12    Period Weeks    Status New      OT LONG TERM GOAL #3   Title Pt will be mod I with hot meal prep with at least two items.    Time 12    Period Weeks    Status New      OT LONG TERM GOAL #4   Title Pt demonstrate improved coordination as evidenced by decreasing time on 9 hole peg by at least 20 seconds with LUE.    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    Occupational performance deficits (Please refer to evaluation for details): ADL's;IADL's;Leisure    Body Structure / Function / Physical Skills ADL;ROM;IADL;Balance;Body mechanics;Mobility;Strength;Coordination;FMC;Pain;UE functional use;Proprioception;Decreased knowledge of use of DME    Cognitive Skills Attention    Rehab Potential  Good    Comorbidities impacting occupational performance description: Lt rotator cuff tear    OT Frequency 2x / week    OT Duration 12 weeks    OT Treatment/Interventions Self-care/ADL training;Therapeutic exercise;Functional Mobility Training;Aquatic Therapy;Neuromuscular  education;Manual Therapy;Splinting;Energy conservation;Therapeutic activities;DME and/or AE instruction;Cognitive remediation/compensation;Visual/perceptual remediation/compensation;Passive range of motion;Patient/family education;Moist Heat    Plan check remaining STG's for in clinic session, continue A.T. to address balance, postural alignment and control, LUE ROM within pain tolerance, and in clinic to address LUE ROM, neuro re-education, coordination, and dynamic standing balance    Consulted and Agree with Plan of Care Patient;Family member/caregiver    Family Member Consulted wife           Patient will benefit from skilled therapeutic intervention in order to improve the following deficits and impairments:   Body Structure / Function / Physical Skills: ADL, ROM, IADL, Balance, Body mechanics, Mobility, Strength, Coordination, FMC, Pain, UE functional use, Proprioception, Decreased knowledge of use of DME Cognitive Skills: Attention     Visit Diagnosis: Other lack of coordination  Muscle weakness (generalized)  Unsteadiness on feet  Hemiplegia and hemiparesis following cerebral infarction affecting left non-dominant side [facility])    Problem List Patient Active Problem List   Diagnosis Date Noted  . Depressive reaction   . Sleep disturbance   . Urinary retention   . Bradycardia   . Essential hypertension   . Temperature elevated   . Left thyroid nodule   . Treatment-emergent central sleep apnea 10/25/2018  . 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 Chronic pain syndrome   . Chronic bilateral low back pain   . Benign essential HTN   . Tobacco abuse   . Marijuana abuse   . Hyperlipidemia   . History of CVA with residual deficit   . Dysphagia, post-stroke   . ICH (intracerebral hemorrhage) (HCC) - R thalmic/PLIC d/t HTN 90/30/0923  . Insomnia 07/13/2017  . PAD (peripheral artery disease) ([place]) 07/13/2017  . Hyperlipidemia with  target LDL less than 70 07/13/2017  . Stroke-like symptom 09/22/2016  . Paresthesia 09/22/2016  . CVA (cerebral vascular accident) ([place]) 09/22/2016  . Cerebrovascular accident (CVA) due to thrombosis of left carotid artery ([place]) 09/23/2015  . Primary snoring 09/23/2015  . Hypersomnia with sleep apnea 09/23/2015  . Obstructive sleep apnea 09/23/2015  . Lacunar infarct, acute ([place]) 08/25/2015  . Sleep apnea 08/25/2015  . Dysarthria   . CVD (cardiovascular disease) 08/02/2015  . Acute hyperglycemia 08/02/2015  . Systolic hypertension with cerebrovascular disease 12/27/2014  . Epistaxis 07/28/2012  . HYPERGLYCEMIA 10/02/2010  . NEPHROLITHIASIS 05/23/2009  . BARRETTS ESOPHAGUS 02/20/2009  . Gastroparesis 02/20/2009  . RADICULOPATHY 10/21/2008  . GERD 10/08/2008  . ESOPHAGITIS 08/15/2008  . ESOPHAGEAL STRICTURE 08/15/2008  . GASTRITIS, ACUTE 08/15/2008  . RENAL CYST 08/14/2008  . TOBACCO ABUSE 08/08/2008  . HEMATURIA, MICROSCOPIC, HX OF 08/08/2008  . PULMONARY NODULE 01/10/2008    Carey Bullocks, OTR/L 05/06/2020, 12:46 PM  [facility] [address] [place], [place], [address] Phone: [phone]   Fax:  [phone]  Name: LEW PROUT MRN: [id number] Date of Birth: 29-Nov-1950

## 2020-05-06 NOTE — Therapy (Signed)
[facility] [address] [place], [place], [address] Phone: [phone]   Fax:  [phone]  Physical Therapy Treatment  Patient Details  Name: Richard Vaughn MRN: [id number] Date of Birth: 1951-10-21 Referring Provider (PT): Reesa Chew PA-C   Encounter Date: 05/06/2020   PT End of Session - 05/06/20 1106    Visit Number 14    Number of Visits 22    Date for PT Re-Evaluation 06/16/20   POC for 6 weeks, Cert for 90 days   Authorization Type Medicare + [employer]BS (10th visit PN)    Progress Note Due on Visit 10    PT Start Time 1102    PT Stop Time 5102    PT Time Calculation (min) 43 min    Equipment Utilized During Treatment Gait belt    Activity Tolerance Patient tolerated treatment well    Behavior During Therapy WFL for tasks assessed/performed           Past Medical History:  Diagnosis Date   Barrett's esophagus    Chronic back pain    Gastritis    Mild   Gastroparesis    GERD (gastroesophageal reflux disease)    Headache    Hyperglycemia    Hypertension    Nephrolithiasis    Renal cyst    Stricture and stenosis of esophagus    Stroke [facility]) 07-2015   Vision abnormalities     Past Surgical History:  Procedure Laterality Date   CERVICAL DISC SURGERY  2012   COLONOSCOPY     LUMBAR [place] SURGERY  2005, 2010   replaced L4 and L5   UPPER GASTROINTESTINAL ENDOSCOPY      There were no vitals filed for this visit.   Subjective Assessment - 05/06/20 1105    Subjective Patient reports that he wasnt feeling well and had to cancel last session. Patient reports feeling better today. No falls to report. Patient reports having usual low back pain today.    Patient is accompained by: Family member   Wife   Pertinent History Chronic Back Pain, Barrett's Esophagus, GERD, Hyperglycemia, HTN, CVA, Hyperlipidemia, vision abnormalities, ICH in the right thalamus/posterior limb of internal capsule     Limitations Standing;Walking;House hold activities    Patient Stated Goals "want to walk", "get back to how I was"    Currently in Pain? Yes    Pain Score 3     Pain Location Back    Pain Orientation Lower    Pain Descriptors / Indicators Aching    Pain Type Chronic pain    Pain Onset More than a month ago                             [facility] Adult PT Treatment/Exercise - 05/06/20 1117      Transfers   Transfers Sit to Stand;Stand to Sit    Sit to Stand 5: Supervision    Sit to Stand Details Verbal cues for technique;Tactile cues for weight shifting    Stand to Sit 5: Supervision    Comments Completed sit <> stand training from mat, altered various heights of mat for further strenghtening. Patient still require verbal cues for improved forward lean at times for proper completion. Patient demo improved descent.       Ambulation/Gait   Ambulation/Gait Yes    Ambulation/Gait Assistance 5: Supervision;4: Min guard    Ambulation/Gait Assistance Details Patient requiring verbal cues for improved toe  clearance and improved step length. Patient demo 2-3 instances of dragging LLE. Overall completed all gait training without AD today.     Ambulation Distance (Feet) 575 Feet    Assistive device None    Gait Pattern Step-through pattern;Decreased hip/knee flexion - left    Ambulation Surface Level;Indoor      High Level Balance   High Level Balance Activities Backward walking;Marching forwards;Side stepping    High Level Balance Comments In // bars, completed the following high level balance activities including backward walking, side stepping, and marching forwards. Patient require verbal cues for improved step length on LLE with completion. Patient requiring intermittent UE support throughout from // bars.       Neuro Re-ed    Neuro Re-ed Details  Standing on balance beam, completed alternating toe taps to 4" step. initially completed with 1 UE suppot from // bars, progressed  to utilizing no UE support from // bars. Increased CGA required when patient using no UE support. Also on balance beam focused on holding steady, 3 x 1 min each with eyes closed.       Knee/Hip Exercises: Aerobic   Other Aerobic SciFit x 6 mins at Level 3.5. Focus on maintaining RPM >65, with focus on BLE strengthening. Patient require verbal cues for maintaining pace throughout.                      PT Short Term Goals - 04/17/20 1346      PT SHORT TERM GOAL #1   Title Pt will ambulate >600' on level surfaces mod I for improved mobility.    Time 3    Period Weeks    Status New    Target Date 05/08/20      PT SHORT TERM GOAL #2   Title Pt will decrease 5x sit to stand from 24 sec to <22 sec for improved balance and functional strength.    Time 3    Period Weeks    Status Revised    Target Date 05/08/20             PT Long Term Goals - 04/17/20 1352      PT LONG TERM GOAL #1   Title Patient will be independent with final HEP    Baseline PT continues to add to HEP    Time 6    Period Weeks    Status On-going    Target Date 05/29/20      PT LONG TERM GOAL #2   Title Pt will increase Berg from 44/56 to 47/56 or more for improved balance and decreased fall risk.    Baseline 36/56, 04/17/20 44/56    Time 6    Period Weeks    Status New    Target Date 05/29/20      PT LONG TERM GOAL #3   Title Pt will improve gait speed to >/=2.0 ft/sec. with LRAD to safely amb. in the community.    Baseline 1.36 ft    Time 6    Period Weeks    Status On-going    Target Date 05/29/20      PT LONG TERM GOAL #4   Title Pt will ambulate >800' on varies surfaces supervision for improved community mobility.    Time 6    Period Weeks    Status Revised    Target Date 05/29/20                 Plan - 05/06/20  1142    Clinical Impression Statement Continued gait training today with no AD. Overall patient require less CGA throughout gait trianing showing progress with  functional mobility. Continued balance activites in // bars and focused on improved coordination and strength of LLE>RLE. Patient will continue to benefit from skilled PT services to progress toward goals.    Personal Factors and Comorbidities Comorbidity 3+    Comorbidities Chronic Back Pain, Barrett's Esophagus, GERD, Hyperglycemia, HTN, CVA, Hyperlipidemia, vision abnormalities, ICH in the right thalamus/posterior limb of internal capsule    Examination-Activity Limitations Bed Mobility;Transfers;Stairs;Stand    Examination-Participation Restrictions Yard Work;Community Activity    Stability/Clinical Decision Making Evolving/Moderate complexity    Rehab Potential Good    PT Frequency 2x / week    PT Duration 6 weeks    PT Treatment/Interventions ADLs/Self Care Home Management;Electrical Stimulation;Moist Heat;Cryotherapy;Gait training;Stair training;Functional mobility training;Therapeutic activities;DME Instruction;Therapeutic exercise;Balance training;Neuromuscular re-education;Patient/family education;Orthotic Fit/Training;Passive range of motion;Visual/perceptual remediation/compensation    PT Next Visit Plan Check Goals. Incorporate more aerobic activity. Continue gait and transfer training without AD. Focus on improving left foot clearance. Strengthening exercises adding in more left hip extension, Standing Balance.    Consulted and Agree with Plan of Care Patient;Family member/caregiver    Family Member Consulted Wife           Patient will benefit from skilled therapeutic intervention in order to improve the following deficits and impairments:  Abnormal gait, Decreased balance, Decreased endurance, Decreased mobility, Difficulty walking, Impaired vision/preception, Decreased knowledge of precautions, Decreased strength, Decreased safety awareness, Decreased knowledge of use of DME, Decreased activity tolerance  Visit Diagnosis: Other abnormalities of gait and mobility  Other lack of  coordination  Muscle weakness (generalized)  Unsteadiness on feet     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) 10/25/2018   Chronic pain syndrome    Chronic bilateral low back pain    Benign essential HTN    Tobacco abuse    Marijuana abuse    Hyperlipidemia    History of CVA with residual deficit    Dysphagia, post-stroke    ICH (intracerebral hemorrhage) ([facility]) - R thalmic/PLIC d/t HTN 51/70/0174   Insomnia 07/13/2017   PAD (peripheral artery disease) ([place] [facility]) 07/13/2017   Hyperlipidemia with target LDL less than 70 07/13/2017   Stroke-like symptom 09/22/2016   Paresthesia 09/22/2016   CVA (cerebral vascular accident) ([facility]) 09/22/2016   Cerebrovascular accident (CVA) due to thrombosis of left carotid artery ([facility]) 09/23/2015   Primary snoring 09/23/2015   Hypersomnia with sleep apnea 09/23/2015   Obstructive sleep apnea 09/23/2015   Lacunar infarct, acute ([facility]) 08/25/2015   Sleep apnea 08/25/2015   Dysarthria    CVD (cardiovascular disease) 08/02/2015   Acute hyperglycemia 94/49/6759   Systolic hypertension with cerebrovascular disease 12/27/2014   Epistaxis 07/28/2012   HYPERGLYCEMIA 10/02/2010   NEPHROLITHIASIS 05/23/2009   BARRETTS ESOPHAGUS 02/20/2009   Gastroparesis 02/20/2009   RADICULOPATHY 10/21/2008   GERD 10/08/2008   ESOPHAGITIS 08/15/2008   ESOPHAGEAL STRICTURE 08/15/2008   GASTRITIS, ACUTE 08/15/2008   RENAL CYST 08/14/2008   TOBACCO ABUSE 08/08/2008   HEMATURIA, MICROSCOPIC, HX OF 08/08/2008   PULMONARY NODULE 01/10/2008    Jones Bales,  PT, DPT 05/06/2020, 11:54 AM  [facility] [address] [place], [place], [address] Phone: [phone]   Fax:  [phone]  Name: Richard Vaughn MRN: [id number] Date of Birth: December 09, 1950

## 2020-05-06 NOTE — Patient Instructions (Addendum)
Get a binder for your daily activities and your therapy materials. Bring with you next time.

## 2020-05-08 ENCOUNTER — Ambulatory Visit: Payer: Medicare Other

## 2020-05-08 ENCOUNTER — Ambulatory Visit: Payer: Medicare Other | Admitting: Occupational Therapy

## 2020-05-08 ENCOUNTER — Ambulatory Visit: Payer: Medicare Other | Admitting: Speech Pathology

## 2020-05-08 ENCOUNTER — Other Ambulatory Visit: Payer: Self-pay

## 2020-05-08 DIAGNOSIS — R471 Dysarthria and anarthria: Secondary | ICD-10-CM | POA: Diagnosis not present

## 2020-05-08 DIAGNOSIS — R2681 Unsteadiness on feet: Secondary | ICD-10-CM

## 2020-05-08 DIAGNOSIS — R278 Other lack of coordination: Secondary | ICD-10-CM

## 2020-05-08 DIAGNOSIS — R41841 Cognitive communication deficit: Secondary | ICD-10-CM

## 2020-05-08 DIAGNOSIS — R2689 Other abnormalities of gait and mobility: Secondary | ICD-10-CM

## 2020-05-08 DIAGNOSIS — M6281 Muscle weakness (generalized): Secondary | ICD-10-CM | POA: Diagnosis not present

## 2020-05-08 DIAGNOSIS — R41842 Visuospatial deficit: Secondary | ICD-10-CM

## 2020-05-08 NOTE — Therapy (Signed)
[facility] [address] [place], [place], [address] Phone: [phone]   Fax:  [phone]  Speech Language Pathology Treatment and Progress Note  Patient Details  Name: Richard Vaughn MRN: [id number] Date of Birth: 1951-01-22 Referring Provider (SLP): Richard Vaughn (referral), [place], Betty, MD (documentation)   Encounter Date: 05/08/2020   End of Session - 05/08/20 1603    Visit Number 10    Number of Visits 17    Date for SLP Re-Evaluation 06/10/20    SLP Start Time 1448    SLP Stop Time  3825    SLP Time Calculation (min) 42 min           Past Medical History:  Diagnosis Date  . Barrett's esophagus   . Chronic back pain   . Gastritis    Mild  . Gastroparesis   . GERD (gastroesophageal reflux disease)   . Headache   . Hyperglycemia   . Hypertension   . Nephrolithiasis   . Renal cyst   . Stricture and stenosis of esophagus   . Stroke ([facility]) 07-2015  . Vision abnormalities     Past Surgical History:  Procedure Laterality Date  . [place] SURGERY  2012  . COLONOSCOPY    . [place] SURGERY  2005, 2010   replaced L4 and L5  . UPPER GASTROINTESTINAL ENDOSCOPY      There were no vitals filed for this visit.   Subjective Assessment - 05/08/20 1452    Subjective "Practice seems redundant."    Currently in Pain? Yes                 ADULT SLP TREATMENT - 05/08/20 1456      General Information   Behavior/Cognition Alert;Cooperative;Pleasant mood      Treatment Provided   Treatment provided Cognitive-Linquistic      Cognitive-Linquistic Treatment   Treatment focused on Dysarthria    Skilled Treatment Based on pt's "S" statement, SLP provided additional worksheets for home practice. Did not bring habit tracker today but SLP requested he bring next session. OT reported limited carryover and pt may benefit from adding his OT/PT exercises to tracker sheet. Phrase level reading tasks: pt  required occasional min-mod cues for loudness, AB. With added cognitive load, patient initial response was sub-WNL, requiring modeling for louder speech, overarticulation. As task progressed, patient began self-correcting and making second attempt when responses were sub-optimal.       Assessment / Recommendations / Plan   Plan Continue with current plan of care      Progression Toward Goals   Progression toward goals Goals downgraded   slow progress, downgraded cognitive goal for awareness             SLP Short Term Goals - 05/08/20 1603      SLP SHORT TERM GOAL #1   Title pt will improve emergent awareness with simple functional tasks to requiring min A rarely over three sessions    Period --   or 9 initital sessions for all STGs   Status Not Met      SLP SHORT TERM GOAL #2   Title pt will demo compensations for dysarthria in simple sentence responses generating at lest 90% intelligibility over 3 sessions    Baseline 04/17/20    Status Partially Met      SLP SHORT TERM GOAL #3   Title pt will demonstrate sustained attention for 15 minutes in mod complex tasks in 3 sessions  Status Not Met      SLP SHORT TERM GOAL #4   Title Pt will complete HEP for unintelligible speech with occaisonal mod A for articulation and loudnes    Baseline 04/17/20    Status Achieved            SLP Long Term Goals - 05/08/20 1604      SLP LONG TERM GOAL #1   Title pt will demo selective attention to complete simple functional tasks in min noisy environment for 10 minutes    Time 4    Period Weeks   or 17 total sessions for all LTGs   Status On-going      SLP LONG TERM GOAL #2   Title pt will demo emergent awareness with mod complex tasks by double checkng work 100% of the time over three sessions    Time 4    Period Weeks    Status Revised      SLP LONG TERM GOAL #3   Title pt will demo speech volume in 8 minutes simple complex conversation, in low 70s dB average over three consecutive  sessions    Time 4    Period Weeks    Status On-going      SLP LONG TERM GOAL #4   Title pt will produce 10 minutes intelligible simple-mod complex conversation using compensations, with rare min A over 3 consecutive sessions    Time 4    Period Weeks    Status On-going      SLP LONG TERM GOAL #5   Title Pt will complete HEP for unintelligible speech with occaisonal min A for articulation and loudness    Time 4    Period Weeks    Status On-going            Plan - 05/08/20 1603    Clinical Impression Statement Pt with some mild premorbid dysarthria. He presents today with mild-mod cognitive communication deficits characterized by decr'd attention and awareness, as well as significant dysarthria decreasing pt's speech intelligbility at the sentence and conversation levels. Patient reports he is now practicing HEP (once vs twice daily). Pt would benefit from skilled ST targeting cognitive linguistics as well as dysarthria.    Speech Therapy Frequency 2x / week    Duration --   8 weeks or 17 total sessions   Treatment/Interventions Oral motor exercises;Compensatory techniques;Functional tasks;SLP instruction and feedback;Patient/family education;Internal/external aids;Cueing hierarchy;Cognitive reorganization    Potential to Achieve Goals Good    Potential Considerations Severity of impairments           Patient will benefit from skilled therapeutic intervention in order to improve the following deficits and impairments:   Dysarthria and anarthria  Cognitive communication deficit    Problem List Patient Active Problem List   Diagnosis Date Noted  . Depressive reaction   . Sleep disturbance   . Urinary retention   . Bradycardia   . Essential hypertension   . Temperature elevated   . Left thyroid nodule   . Treatment-emergent central sleep apnea 10/25/2018  . 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 Chronic pain syndrome   . Chronic  bilateral low back pain   . Benign essential HTN   . Tobacco abuse   . Marijuana abuse   . Hyperlipidemia   . History of CVA with residual deficit   . Dysphagia, post-stroke   . ICH (intracerebral hemorrhage) ([facility]) - R thalmic/PLIC d/t HTN 51/88/4166  . Insomnia 07/13/2017  .  PAD (peripheral artery disease) ([facility]) 07/13/2017  . Hyperlipidemia with target LDL less than 70 07/13/2017  . Stroke-like symptom 09/22/2016  . Paresthesia 09/22/2016  . CVA (cerebral vascular accident) ([facility]) 09/22/2016  . Cerebrovascular accident (CVA) due to thrombosis of left carotid artery ([place]) 09/23/2015  . Primary snoring 09/23/2015  . Hypersomnia with sleep apnea 09/23/2015  . Obstructive sleep apnea 09/23/2015  . Lacunar infarct, acute ([place]) 08/25/2015  . Sleep apnea 08/25/2015  . Dysarthria   . CVD (cardiovascular disease) 08/02/2015  . Acute hyperglycemia 08/02/2015  . Systolic hypertension with cerebrovascular disease 12/27/2014  . Epistaxis 07/28/2012  . HYPERGLYCEMIA 10/02/2010  . NEPHROLITHIASIS 05/23/2009  . BARRETTS ESOPHAGUS 02/20/2009  . Gastroparesis 02/20/2009  . RADICULOPATHY 10/21/2008  . GERD 10/08/2008  . ESOPHAGITIS 08/15/2008  . ESOPHAGEAL STRICTURE 08/15/2008  . GASTRITIS, ACUTE 08/15/2008  . RENAL CYST 08/14/2008  . TOBACCO ABUSE 08/08/2008  . HEMATURIA, MICROSCOPIC, HX OF 08/08/2008  . PULMONARY NODULE 01/10/2008   Speech Therapy Progress Note  Dates of Reporting Period: 03/12/20 to 05/08/20  Patient has been seen for 10 speech therapy sessions targeting dysarthria and cognitive communication impairment. He is making slow progress toward goals. See goals and clinical impressions above for details.  Deneise Lever, [place], CCC-SLP Speech-Language Pathologist   Aliene Altes 05/08/2020, 4:06 PM  [facility] [address] [place], [place], [address] Phone: [phone]   Fax:  [phone]   Name: Richard Vaughn MRN: [id number] Date of Birth: [birth year]

## 2020-05-08 NOTE — Therapy (Signed)
[facility] [address] [place], [place], [address] Phone: [phone]   Fax:  [phone]  Occupational Therapy Treatment  Patient Details  Name: Richard Vaughn MRN: [id number] Date of Birth: July 22, 1951 Referring Provider (OT): Reesa Chew, [place]   Encounter Date: 05/08/2020   OT End of Session - 05/08/20 1722    Visit Number 12    Number of Visits 25    Date for OT Re-Evaluation 06/26/20    Authorization Type MCR, BS/BS    Authorization - Visit Number 12    Authorization - Number of Visits 20    Progress Note Due on Visit 20    OT Start Time 1530    OT Stop Time 1615    OT Time Calculation (min) 45 min    Activity Tolerance Patient tolerated treatment well    Behavior During Therapy [facility] for tasks assessed/performed           Past Medical History:  Diagnosis Date  . Barrett's esophagus   . Chronic back pain   . Gastritis    Mild  . Gastroparesis   . GERD (gastroesophageal reflux disease)   . Headache   . Hyperglycemia   . Hypertension   . Nephrolithiasis   . Renal cyst   . Stricture and stenosis of esophagus   . Stroke ([place]) 07-2015  . Vision abnormalities     Past Surgical History:  Procedure Laterality Date  . [place] SURGERY  2012  . COLONOSCOPY    . [place] SURGERY  2005, 2010   replaced L4 and L5  . UPPER GASTROINTESTINAL ENDOSCOPY      There were no vitals filed for this visit.   Subjective Assessment - 05/08/20 1552    Pertinent History Rt thalamic hemmorhage w/ Lt hemiparesis 02/22/20. PMH: Stroke 2019 w/ Lt hemiparesis, HLD, HTN, GERD, Barretts esophagus    Limitations fall risk, no driving, Lt shoulder rotator cuff tear (see MRI results 03/14/20)    Patient Stated Goals Return to normal, driving, baking    Currently in Pain? Yes   Lt shoulder w/ certain movements d/t rotator cuff tear          Assessed remaining STG's and progress towards LTG's - pt reports he is now  able to cook simple stovetop meal (oatmeal, egg) and wife verifies he is doing this at home. Pt also going with wife to grocery store.  Copying small peg design Lt hand for coordination with mod drops and assist from Rt hand to orient peg in Lt hand for placement. Pt also had difficulty following design and required therapist assist to correct mistakes - ? Visual/perceptual deficits  Pt then practiced walking in tight spaces for walker negotiation and Lt attention                       OT Short Term Goals - 05/08/20 1556      OT SHORT TERM GOAL #1   Title Pt will be mod I with HEP for coordination for LUE, balance    Time 6    Period Weeks    Status Achieved      OT SHORT TERM GOAL #2   Title Pt will consistently don socks and tie shoes    Time 6    Period Weeks    Status Achieved      OT SHORT TERM GOAL #3   Title Pt will demonstrate improved coordination as evidenced by decreasing  time on 9 hole peg with LUE by at least 10 seconds to assist with functional fine motor tasks.    Baseline 69.06 sec    Time 6    Period Weeks    Status Achieved   51.43     OT SHORT TERM GOAL #4   Title Pt will report overall less drops Lt hand during the day attending to Lt side consistently    Time 6    Period Weeks    Status Achieved      OT SHORT TERM GOAL #5   Title Pt will perform dynamic standing tasks w/ supervision without LOB    Time 6    Period Weeks    Status Achieved             OT Long Term Goals - 05/08/20 1556      OT LONG TERM GOAL #1   Title Pt will be independent with updated HEP including aquatic HEP prn    Time 12    Period Weeks    Status On-going      OT LONG TERM GOAL #2   Title Pt will be mod I with shower transfers    Time 12    Period Weeks    Status On-going   awaiting for grab bars to be installed     OT LONG TERM GOAL #3   Title Pt will be mod I with hot meal prep with at least two items.    Time 12    Period Weeks    Status  On-going   Pt currently cooking simple one meal dishes (oatmeal, egg)     OT LONG TERM GOAL #4   Title Pt demonstrate improved coordination as evidenced by decreasing time on 9 hole peg by at least 20 seconds with LUE.    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 Pt has shown improvement in balance and sit to stand.    Occupational performance deficits (Please refer to evaluation for details): ADL's;IADL's;Leisure    Body Structure / Function / Physical Skills ADL;ROM;IADL;Balance;Body mechanics;Mobility;Strength;Coordination;FMC;Pain;UE functional use;Proprioception;Decreased knowledge of use of DME    Cognitive Skills Attention    Comorbidities impacting occupational performance description: Lt rotator cuff tear    OT Frequency 2x / week    OT Duration 12 weeks    OT Treatment/Interventions Self-care/ADL training;Therapeutic exercise;Functional Mobility Training;Aquatic Therapy;Neuromuscular education;Manual Therapy;Splinting;Energy conservation;Therapeutic activities;DME and/or AE instruction;Cognitive remediation/compensation;Visual/perceptual remediation/compensation;Passive range of motion;Patient/family education;Moist Heat    Plan continue A.T. to address balance, postural alignment and control, LUE ROM within pain tolerance, and in clinic to address LUE ROM, neuro re-education, coordination, and dynamic standing  balance    Consulted and Agree with Plan of Care Patient;Family member/caregiver           Patient will benefit from skilled therapeutic intervention in order to improve the following deficits and impairments:   Body Structure / Function / Physical Skills: ADL, ROM, IADL, Balance, Body mechanics, Mobility, Strength, Coordination, FMC, Pain, UE functional use, Proprioception, Decreased knowledge of use of DME Cognitive Skills: Attention     Visit Diagnosis: Other lack of coordination  Unsteadiness on feet  Visuospatial deficit    Problem List Patient Active Problem List   Diagnosis Date Noted  . Depressive reaction   . Sleep disturbance   . Urinary retention   . Bradycardia   . Essential hypertension   . Temperature elevated   . Left thyroid nodule   . Treatment-emergent central sleep apnea 10/25/2018  . 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 Chronic pain syndrome   . Chronic bilateral low back pain   . Benign essential HTN   . Tobacco abuse   . Marijuana abuse   . Hyperlipidemia   . History of CVA with residual deficit   . Dysphagia, post-stroke   . ICH (intracerebral hemorrhage) (HCC) - R thalmic/PLIC d/t HTN 75/91/6384  . Insomnia 07/13/2017  . PAD (peripheral artery disease) ([place]) 07/13/2017  . Hyperlipidemia with target LDL less than 70 07/13/2017  . Stroke-like symptom 09/22/2016  . Paresthesia 09/22/2016  . CVA (cerebral vascular accident) ([place]) 09/22/2016  . Cerebrovascular accident (CVA) due to thrombosis of left carotid artery ([place]) 09/23/2015  . Primary snoring 09/23/2015  . Hypersomnia with sleep apnea 09/23/2015  . Obstructive sleep apnea 09/23/2015  . Lacunar infarct, acute ([place]) 08/25/2015  . Sleep apnea 08/25/2015  . Dysarthria   . CVD (cardiovascular disease) 08/02/2015  . Acute hyperglycemia 08/02/2015  . Systolic hypertension with cerebrovascular disease 12/27/2014  . Epistaxis 07/28/2012  .  HYPERGLYCEMIA 10/02/2010  . NEPHROLITHIASIS 05/23/2009  . BARRETTS ESOPHAGUS 02/20/2009  . Gastroparesis 02/20/2009  . RADICULOPATHY 10/21/2008  . GERD 10/08/2008  . ESOPHAGITIS 08/15/2008  . ESOPHAGEAL STRICTURE 08/15/2008  . GASTRITIS, ACUTE 08/15/2008  . RENAL CYST 08/14/2008  . TOBACCO ABUSE 08/08/2008  . HEMATURIA, MICROSCOPIC, HX OF 08/08/2008  . PULMONARY NODULE 01/10/2008    Carey Bullocks, OTR/L 05/08/2020, 5:24 PM  [facility] [address] [place], [place], [address] Phone: [phone]   Fax:  [phone]  Name: Richard Vaughn MRN: [id number] Date of Birth: 01-19-1951

## 2020-05-08 NOTE — Therapy (Signed)
[facility] [address] [place], [place], [address] Phone: [phone]   Fax:  [phone]  Physical Therapy Treatment  Patient Details  Name: Richard Vaughn MRN: [id number] Date of Birth: [birth year] Referring Provider (PT): Reesa Chew PA-C   Encounter Date: 05/08/2020   PT End of Session - 05/08/20 1619    Visit Number 15    Number of Visits 22    Date for PT Re-Evaluation 06/16/20   POC for 6 weeks, Cert for 90 days   Authorization Type Medicare + [employer] (10th visit PN)    Progress Note Due on Visit 10    PT Start Time 1617    PT Stop Time 1659    PT Time Calculation (min) 42 min    Equipment Utilized During Treatment Gait belt    Activity Tolerance Patient tolerated treatment well    Behavior During Therapy WFL for tasks assessed/performed           Past Medical History:  Diagnosis Date  . Barrett's esophagus   . Chronic back pain   . Gastritis    Mild  . Gastroparesis   . GERD (gastroesophageal reflux disease)   . Headache   . Hyperglycemia   . Hypertension   . Nephrolithiasis   . Renal cyst   . Stricture and stenosis of esophagus   . Stroke ([place]) 07-2015  . Vision abnormalities     Past Surgical History:  Procedure Laterality Date  . [place] SURGERY  2012  . COLONOSCOPY    . [place] SURGERY  2005, 2010   replaced L4 and L5  . UPPER GASTROINTESTINAL ENDOSCOPY      There were no vitals filed for this visit.   Subjective Assessment - 05/08/20 1618    Subjective Patient reports doing well since last tuesday. No falls.    Patient is accompained by: Family member   Wife   Pertinent History Chronic Back Pain, Barrett's Esophagus, GERD, Hyperglycemia, HTN, CVA, Hyperlipidemia, vision abnormalities, ICH in the right thalamus/posterior limb of internal capsule    Limitations Standing;Walking;House hold activities    Patient Stated Goals "want to walk", "get back to how I was"     Currently in Pain? Yes    Pain Score 3     Pain Location Back    Pain Orientation Lower    Pain Descriptors / Indicators Aching    Pain Type Chronic pain    Pain Onset More than a month ago                             [facility] Adult PT Treatment/Exercise - 05/08/20 0001      Transfers   Transfers Sit to Stand;Stand to Sit    Sit to Stand 5: Supervision    Sit to Stand Details Verbal cues for technique;Tactile cues for weight shifting    Five time sit to stand comments  21.25 from mat w/o UE support    Stand to Sit 5: Supervision      Ambulation/Gait   Ambulation/Gait Yes    Ambulation/Gait Assistance 6: Modified independent (Device/Increase time);4: Min guard    Ambulation/Gait Assistance Details Completed ambulation 690 ft with rollator at Mod I level, patient demo improved gait speed with AD and decreased instances of foot drag with ambulation. Followed with gait training w/o AD, with PT providing CGA throughout and verbal cues for improved step length, improved L arm swing,  and improved L toe clearance.     Ambulation Distance (Feet) 690 Feet   250 w/o AD   Assistive device Rollator;None    Gait Pattern Step-through pattern;Decreased hip/knee flexion - left    Ambulation Surface Level;Indoor    Stairs Yes    Stairs Assistance 5: Supervision;4: Min guard    Stairs Assistance Details (indicate cue type and reason) Patient completed alternating pattern with ascending stairs, and with descending completing step to pattern with bilateral rails. Supv with ascending, and intermittent CGA for descending.     Stair Management Technique Two rails;Alternating pattern;Step to pattern;Forwards    Number of Stairs 12    Height of Stairs 6    Gait Comments Completed gait obstacle course without AD, completed negoitaitng around obstacles including 4 cones and stepping over orange hurdles. Patient demo frequent hitting of cones with LLE, and requiring verbal cues for improved turns.  Patient did demonstrate 2-3 instances of difficulty clearing LLE over orange hurdle and require CGA for steadying.       Neuro Re-ed    Neuro Re-ed Details  Standing on blue mat, completedd alternating toe taps to cones with side step along length of blue mat x 4 laps. Pt utilizing UE support from countertop for completion and maintaining balance.                   PT Education - 05/08/20 2018    Education Details Educated on progress toward [employer]) Educated Patient;Spouse    Methods Explanation    Comprehension Verbalized understanding            PT Short Term Goals - 05/08/20 1621      PT SHORT TERM GOAL #1   Title Pt will ambulate >600' on level surfaces mod I for improved mobility.    Baseline Ambulated 690 Mod I with Rollator    Time 3    Period Weeks    Status Achieved    Target Date 05/08/20      PT SHORT TERM GOAL #2   Title Pt will decrease 5x sit to stand from 24 sec to <22 sec for improved balance and functional strength.    Baseline 21.25 secs w/o UE support from mat    Time 3    Period Weeks    Status Achieved    Target Date 05/08/20             PT Long Term Goals - 04/17/20 1352      PT LONG TERM GOAL #1   Title Patient will be independent with final HEP    Baseline PT continues to add to HEP    Time 6    Period Weeks    Status On-going    Target Date 05/29/20      PT LONG TERM GOAL #2   Title Pt will increase Berg from 44/56 to 47/56 or more for improved balance and decreased fall risk.    Baseline 36/56, 04/17/20 44/56    Time 6    Period Weeks    Status New    Target Date 05/29/20      PT LONG TERM GOAL #3   Title Pt will improve gait speed to >/=2.0 ft/sec. with LRAD to safely amb. in the community.    Baseline 1.36 ft    Time 6    Period Weeks    Status On-going    Target Date 05/29/20      PT LONG TERM  GOAL #4   Title Pt will ambulate >800' on varies surfaces supervision for improved community mobility.    Time 6     Period Weeks    Status Revised    Target Date 05/29/20                 Plan - 05/08/20 2019    Clinical Impression Statement Today's skilled PT session focused on assessment of patient's progress toward STG's. Patient demo ability to meet all STG's today. Followed with continued gait and stair training without AD. Pt will continue to benefit from skilled PT services to progress toward all LTG's.    Personal Factors and Comorbidities Comorbidity 3+    Comorbidities Chronic Back Pain, Barrett's Esophagus, GERD, Hyperglycemia, HTN, CVA, Hyperlipidemia, vision abnormalities, ICH in the right thalamus/posterior limb of internal capsule    Examination-Activity Limitations Bed Mobility;Transfers;Stairs;Stand    Examination-Participation Restrictions Yard Work;Community Activity    Stability/Clinical Decision Making Evolving/Moderate complexity    Rehab Potential Good    PT Frequency 2x / week    PT Duration 6 weeks    PT Treatment/Interventions ADLs/Self Care Home Management;Electrical Stimulation;Moist Heat;Cryotherapy;Gait training;Stair training;Functional mobility training;Therapeutic activities;DME Instruction;Therapeutic exercise;Balance training;Neuromuscular re-education;Patient/family education;Orthotic Fit/Training;Passive range of motion;Visual/perceptual remediation/compensation    PT Next Visit Plan Incorporate more aerobic activity. Continue gait and transfer training without AD. Focus on improving left foot clearance. Strengthening exercises adding in more left hip extension, Standing Balance.    Consulted and Agree with Plan of Care Patient;Family member/caregiver    Family Member Consulted Wife           Patient will benefit from skilled therapeutic intervention in order to improve the following deficits and impairments:  Abnormal gait, Decreased balance, Decreased endurance, Decreased mobility, Difficulty walking, Impaired vision/preception, Decreased knowledge of  precautions, Decreased strength, Decreased safety awareness, Decreased knowledge of use of DME, Decreased activity tolerance  Visit Diagnosis: Other abnormalities of gait and mobility  Unsteadiness on feet  Muscle weakness (generalized)  Other lack of coordination     Problem List Patient Active Problem List   Diagnosis Date Noted  . Depressive reaction   . Sleep disturbance   . Urinary retention   . Bradycardia   . Essential hypertension   . Temperature elevated   . Left thyroid nodule   . Treatment-emergent central sleep apnea 10/25/2018  . 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 Chronic pain syndrome   . Chronic bilateral low back pain   . Benign essential HTN   . Tobacco abuse   . Marijuana abuse   . Hyperlipidemia   . History of CVA with residual deficit   . Dysphagia, post-stroke   . ICH (intracerebral hemorrhage) (HCC) - R thalmic/PLIC d/t HTN 76/16/0737  . Insomnia 07/13/2017  . PAD (peripheral artery disease) ([place]) 07/13/2017  . Hyperlipidemia with target LDL less than 70 07/13/2017  . Stroke-like symptom 09/22/2016  . Paresthesia 09/22/2016  . CVA (cerebral vascular accident) ([place]) 09/22/2016  . Cerebrovascular accident (CVA) due to thrombosis of left carotid artery ([place]) 09/23/2015  . Primary snoring 09/23/2015  . Hypersomnia with sleep apnea 09/23/2015  . Obstructive sleep apnea 09/23/2015  . Lacunar infarct, acute ([place]) 08/25/2015  . Sleep apnea 08/25/2015  . Dysarthria   . CVD (cardiovascular disease) 08/02/2015  . Acute hyperglycemia 08/02/2015  . Systolic hypertension with cerebrovascular disease 12/27/2014  . Epistaxis 07/28/2012  . HYPERGLYCEMIA 10/02/2010  . NEPHROLITHIASIS 05/23/2009  . BARRETTS ESOPHAGUS 02/20/2009  . Gastroparesis 02/20/2009  .  RADICULOPATHY 10/21/2008  . GERD 10/08/2008  . ESOPHAGITIS 08/15/2008  . ESOPHAGEAL STRICTURE 08/15/2008  . GASTRITIS, ACUTE 08/15/2008  . RENAL CYST  08/14/2008  . TOBACCO ABUSE 08/08/2008  . HEMATURIA, MICROSCOPIC, HX OF 08/08/2008  . PULMONARY NODULE 01/10/2008    Jones Bales, PT, DPT 05/08/2020, 8:22 PM  [facility] [address] [place], [place], [address] Phone: [phone]   Fax:  [phone]  Name: Richard Vaughn MRN: [id number] Date of Birth: July 07, 1951

## 2020-05-12 ENCOUNTER — Ambulatory Visit: Payer: Medicare Other | Admitting: Occupational Therapy

## 2020-05-12 ENCOUNTER — Encounter: Payer: Self-pay | Admitting: Occupational Therapy

## 2020-05-12 ENCOUNTER — Other Ambulatory Visit: Payer: Self-pay

## 2020-05-12 DIAGNOSIS — R293 Abnormal posture: Secondary | ICD-10-CM

## 2020-05-12 DIAGNOSIS — R2681 Unsteadiness on feet: Secondary | ICD-10-CM

## 2020-05-12 DIAGNOSIS — R41841 Cognitive communication deficit: Secondary | ICD-10-CM | POA: Diagnosis not present

## 2020-05-12 DIAGNOSIS — M25512 Pain in left shoulder: Secondary | ICD-10-CM

## 2020-05-12 DIAGNOSIS — R278 Other lack of coordination: Secondary | ICD-10-CM | POA: Diagnosis not present

## 2020-05-12 DIAGNOSIS — I69354 Hemiplegia and hemiparesis following cerebral infarction affecting left non-dominant side: Secondary | ICD-10-CM

## 2020-05-12 DIAGNOSIS — G8929 Other chronic pain: Secondary | ICD-10-CM

## 2020-05-12 DIAGNOSIS — R471 Dysarthria and anarthria: Secondary | ICD-10-CM | POA: Diagnosis not present

## 2020-05-12 DIAGNOSIS — R41842 Visuospatial deficit: Secondary | ICD-10-CM

## 2020-05-12 DIAGNOSIS — M6281 Muscle weakness (generalized): Secondary | ICD-10-CM | POA: Diagnosis not present

## 2020-05-12 DIAGNOSIS — R2689 Other abnormalities of gait and mobility: Secondary | ICD-10-CM | POA: Diagnosis not present

## 2020-05-12 NOTE — Therapy (Signed)
[facility] [address] [place], [place], [address] Phone: [phone]   Fax:  [phone]  Occupational Therapy Treatment  Patient Details  Name: Richard Vaughn MRN: [id number] Date of Birth: 1951-06-17 Referring Provider (OT): Reesa Chew, [place]   Encounter Date: 05/12/2020   OT End of Session - 05/12/20 1828    Visit Number 13    Number of Visits 25    Date for OT Re-Evaluation 06/26/20    Authorization Type MCR, BS/BS    Authorization - Visit Number 13    Authorization - Number of Visits 20    OT Start Time 1331    OT Stop Time 1415    OT Time Calculation (min) 44 min    Activity Tolerance Patient tolerated treatment well           Past Medical History:  Diagnosis Date  . Barrett's esophagus   . Chronic back pain   . Gastritis    Mild  . Gastroparesis   . GERD (gastroesophageal reflux disease)   . Headache   . Hyperglycemia   . Hypertension   . Nephrolithiasis   . Renal cyst   . Stricture and stenosis of esophagus   . Stroke ([place]) 07-2015  . Vision abnormalities     Past Surgical History:  Procedure Laterality Date  . [place] SURGERY  2012  . COLONOSCOPY    . [place] SURGERY  2005, 2010   replaced L4 and L5  . UPPER GASTROINTESTINAL ENDOSCOPY      There were no vitals filed for this visit.   Subjective Assessment - 05/12/20 1825    Subjective  I do think my balance is better    Pertinent History Rt thalamic hemmorhage w/ Lt hemiparesis 02/22/20. PMH: Stroke 2019 w/ Lt hemiparesis, HLD, HTN, GERD, Barretts esophagus    Limitations fall risk, no driving, Lt shoulder rotator cuff tear (see MRI results 03/14/20)    Patient Stated Goals Return to normal, driving, baking    Currently in Pain? Yes    Pain Score 3     Pain Location Back    Pain Orientation Lower    Pain Descriptors / Indicators Aching    Pain Type Chronic pain    Pain Onset More than a month ago    Pain Frequency  Intermittent    Aggravating Factors  certain movements    Pain Relieving Factors avoiding those movements           Patient seen for aquatic therapy today.  Treatment took place in water 2.5-4 feet deep depending upon activity.  Pt entered the pool via steps using 2 hand railings close supervision and 1 global cue for sequencing.  Utilized Unpredictable Command Technique to address dynamic standing balance and functional ambulation for stops/starts, head turns, body turns, changing direction. Pt initially with frequent LOB however with repetition performance improved. Water provides safe arena for increased challenge to postural reactions and postural control; viscosity of water also provides increased challenge.  Transitioned into supine to address rigidity and abnormal posturing related to balance  - pt initially fearful however with encouragement and practice pt able to eventually relax and allow buoyancy to reduce rigidity. Use of simple [place] to further decrease rigidity in trunk and extremities with emphasis on lower trunk movement on upper trunk.  Transitioned into squatting and addressed deep breathing with focus on increased volume of speech - pt with improved phonation today. Transitioned into standing and addressed lateral  weight shifting to single leg stance on each leg with hand held assist to decrease L bias and improve pt's ability to weight shift fully onto RLE with functional mobility.  Addressed functional ambulation in open water without UE support with emphasis on more narrow BOS, normalized step length, normalized speed and decreasing L bias.  Pt exited the pool via steps and 2 hand railings with close supervision and cues for step size to clear LLE.  I  Note: In discussion pt reports he is feeling somewhat depressed however denies any suicidal ideation. Pt is open to considering counseling and possible medication.  Will follow up with primary PT and OT to obtain referral  information for pt with pt's permission. Pt in agreement with plan.                         OT Short Term Goals - 05/08/20 1556      OT SHORT TERM GOAL #1   Title Pt will be mod I with HEP for coordination for LUE, balance    Time 6    Period Weeks    Status Achieved      OT SHORT TERM GOAL #2   Title Pt will consistently don socks and tie shoes    Time 6    Period Weeks    Status Achieved      OT SHORT TERM GOAL #3   Title Pt will demonstrate improved coordination as evidenced by decreasing time on 9 hole peg with LUE by at least 10 seconds to assist with functional fine motor tasks.    Baseline 69.06 sec    Time 6    Period Weeks    Status Achieved   51.43     OT SHORT TERM GOAL #4   Title Pt will report overall less drops Lt hand during the day attending to Lt side consistently    Time 6    Period Weeks    Status Achieved      OT SHORT TERM GOAL #5   Title Pt will perform dynamic standing tasks w/ supervision without LOB    Time 6    Period Weeks    Status Achieved             OT Long Term Goals - 05/08/20 1556      OT LONG TERM GOAL #1   Title Pt will be independent with updated HEP including aquatic HEP prn    Time 12    Period Weeks    Status On-going      OT LONG TERM GOAL #2   Title Pt will be mod I with shower transfers    Time 12    Period Weeks    Status On-going   awaiting for grab bars to be installed     OT LONG TERM GOAL #3   Title Pt will be mod I with hot meal prep with at least two items.    Time 12    Period Weeks    Status On-going   Pt currently cooking simple one meal dishes (oatmeal, egg)     OT LONG TERM GOAL #4   Title Pt demonstrate improved coordination as evidenced by decreasing time on 9 hole peg by at least 20 seconds with LUE.    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 Pt also with slow improvement in overall activity tolerance    OT Occupational Profile and History Detailed Assessment- Review of Records and additional review of physical, cognitive, psychosocial history related to current functional performance    Occupational performance deficits (Please refer to evaluation for details): ADL's;IADL's;Leisure    Body Structure / Function / Physical Skills ADL;ROM;IADL;Balance;Body mechanics;Mobility;Strength;Coordination;FMC;Pain;UE functional use;Proprioception;Decreased knowledge of use of DME    Cognitive Skills Attention    Rehab Potential Good    Clinical Decision Making Several treatment options, min-mod task modification necessary    Comorbidities Affecting Occupational Performance: Presence of comorbidities impacting occupational performance    Comorbidities impacting occupational performance description: Lt rotator cuff tear    Modification or Assistance to Complete Evaluation  Min-Moderate modification of tasks or assist with assess necessary to complete eval    OT Frequency 2x / week    OT Duration 12 weeks    OT Treatment/Interventions Self-care/ADL training;Therapeutic exercise;Functional Mobility Training;Aquatic Therapy;Neuromuscular education;Manual Therapy;Splinting;Energy conservation;Therapeutic activities;DME and/or AE instruction;Cognitive remediation/compensation;Visual/perceptual remediation/compensation;Passive range of motion;Patient/family education;Moist Heat     Plan NMR to address balance, postural alignment and control, LUE ROM within pain tolerance, coordination, and dynamic standing balance    Consulted and Agree with Plan of Care Patient           Patient will benefit from skilled therapeutic intervention in order to improve the following deficits and impairments:   Body Structure / Function / Physical Skills: ADL, ROM, IADL, Balance, Body mechanics, Mobility, Strength, Coordination, FMC, Pain, UE functional use, Proprioception, Decreased knowledge of use of DME Cognitive Skills: Attention     Visit Diagnosis: Unsteadiness on feet  Muscle weakness (generalized)  Other lack of coordination  Visuospatial deficit  Hemiplegia and hemiparesis following cerebral infarction affecting left non-dominant side (HCC)  Chronic left shoulder pain  Abnormal posture    Problem List Patient Active Problem List   Diagnosis Date Noted  . Depressive reaction   . Sleep disturbance   . Urinary retention   . Bradycardia   . Essential hypertension   . Temperature elevated   . Left thyroid nodule   . Treatment-emergent central sleep apnea 10/25/2018  . 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 Chronic pain syndrome   . Chronic bilateral low back pain   . Benign essential HTN   . Tobacco abuse   . Marijuana abuse   . Hyperlipidemia   . History of CVA with residual deficit   . Dysphagia, post-stroke   . ICH (intracerebral hemorrhage) (HCC) - R thalmic/PLIC d/t HTN 65/99/3570  . Insomnia 07/13/2017  . PAD (peripheral artery disease) ([place]) 07/13/2017  . Hyperlipidemia with target LDL less than 70 07/13/2017  . Stroke-like symptom 09/22/2016  . Paresthesia 09/22/2016  .  CVA (cerebral vascular accident) ([place]) 09/22/2016  . Cerebrovascular accident (CVA) due to thrombosis of left carotid artery ([facility]) 09/23/2015  . Primary snoring 09/23/2015  . Hypersomnia with sleep apnea 09/23/2015  . Obstructive sleep  apnea 09/23/2015  . Lacunar infarct, acute ([place]) 08/25/2015  . Sleep apnea 08/25/2015  . Dysarthria   . CVD (cardiovascular disease) 08/02/2015  . Acute hyperglycemia 08/02/2015  . Systolic hypertension with cerebrovascular disease 12/27/2014  . Epistaxis 07/28/2012  . HYPERGLYCEMIA 10/02/2010  . NEPHROLITHIASIS 05/23/2009  . BARRETTS ESOPHAGUS 02/20/2009  . Gastroparesis 02/20/2009  . RADICULOPATHY 10/21/2008  . GERD 10/08/2008  . ESOPHAGITIS 08/15/2008  . ESOPHAGEAL STRICTURE 08/15/2008  . GASTRITIS, ACUTE 08/15/2008  . RENAL CYST 08/14/2008  . TOBACCO ABUSE 08/08/2008  . HEMATURIA, MICROSCOPIC, HX OF 08/08/2008  . PULMONARY NODULE 01/10/2008    Quay Burow, OTR/L 05/12/2020, 6:30 PM  [facility] [address] [place], [place], [address] Phone: [phone]   Fax:  [phone]  Name: Richard Vaughn MRN: [id number] Date of Birth: [birth year]

## 2020-05-13 ENCOUNTER — Ambulatory Visit: Payer: Medicare Other | Admitting: Speech Pathology

## 2020-05-13 ENCOUNTER — Ambulatory Visit: Payer: Medicare Other

## 2020-05-13 DIAGNOSIS — M6281 Muscle weakness (generalized): Secondary | ICD-10-CM

## 2020-05-13 DIAGNOSIS — R278 Other lack of coordination: Secondary | ICD-10-CM | POA: Diagnosis not present

## 2020-05-13 DIAGNOSIS — R2689 Other abnormalities of gait and mobility: Secondary | ICD-10-CM | POA: Diagnosis not present

## 2020-05-13 DIAGNOSIS — R471 Dysarthria and anarthria: Secondary | ICD-10-CM | POA: Diagnosis not present

## 2020-05-13 DIAGNOSIS — R2681 Unsteadiness on feet: Secondary | ICD-10-CM | POA: Diagnosis not present

## 2020-05-13 DIAGNOSIS — R41841 Cognitive communication deficit: Secondary | ICD-10-CM

## 2020-05-13 NOTE — Therapy (Signed)
[facility] [address] [place], [place], [address] Phone: [phone]   Fax:  [phone]  Speech Language Pathology Treatment  Patient Details  Name: Richard Vaughn MRN: [id number] Date of Birth: [birth year] Referring Provider (SLP): Reesa Chew (referral), [place], Betty, MD (documentation)   Encounter Date: 05/13/2020   End of Session - 05/13/20 1055    Visit Number 11    Number of Visits 17    Date for SLP Re-Evaluation 06/10/20    SLP Start Time 0938   8 min late   SLP Stop Time  1015    SLP Time Calculation (min) 37 min    Activity Tolerance Patient tolerated treatment well           Past Medical History:  Diagnosis Date  . Barrett's esophagus   . Chronic back pain   . Gastritis    Mild  . Gastroparesis   . GERD (gastroesophageal reflux disease)   . Headache   . Hyperglycemia   . Hypertension   . Nephrolithiasis   . Renal cyst   . Stricture and stenosis of esophagus   . Stroke ([facility] [facility]) 07-2015  . Vision abnormalities     Past Surgical History:  Procedure Laterality Date  . [place] SURGERY  2012  . COLONOSCOPY    . [place] SURGERY  2005, 2010   replaced L4 and L5  . UPPER GASTROINTESTINAL ENDOSCOPY      There were no vitals filed for this visit.   Subjective Assessment - 05/13/20 0942    Subjective Pt self-corrected inadequate breath support in initial conversation with SLP.    Currently in Pain? Yes    Pain Score 3     Pain Location Back    Pain Orientation Lower                 ADULT SLP TREATMENT - 05/13/20 0943      General Information   Behavior/Cognition Alert;Cooperative;Pleasant mood      Treatment Provided   Treatment provided Cognitive-Linquistic      Cognitive-Linquistic Treatment   Treatment focused on Dysarthria;Cognition    Skilled Treatment Cognition: (25 minutes): SLP reviewed cognitive goals with patient including attention and awareness; patient  able to give examples of when his attention impacts him at home, such as leaving the water running or getting distracted before completing an activity. Educated patient on strategies for attention; he also asked about online activities so SLP set up pt with cognitive home program and pt completed mod complex attention activities (min noise distraction) with occasional min A, 90% accuracy. Speech tx (12 minutes) In simple sentence level tasks and short conversational responses, pt used AB and overarticulation with rare min A. Patient required frequent verbal and demonstration cues when reading short article aloud for breath support, pacing and overarticulation.       Assessment / Recommendations / Plan   Plan Continue with current plan of care      Progression Toward Goals   Progression toward goals Progressing toward goals            SLP Education - 05/13/20 1055    Education Details online activities for speech and cognition    Person(s) Educated Patient    Methods Explanation;Other (comment)   email   Comprehension Verbalized understanding            SLP Short Term Goals - 05/13/20 0943      SLP SHORT TERM GOAL #1  Title pt will improve emergent awareness with simple functional tasks to requiring min A rarely over three sessions    Period --   or 9 initital sessions for all STGs   Status Not Met      SLP SHORT TERM GOAL #2   Title pt will demo compensations for dysarthria in simple sentence responses generating at lest 90% intelligibility over 3 sessions    Baseline 04/17/20    Status Partially Met      SLP SHORT TERM GOAL #3   Title pt will demonstrate sustained attention for 15 minutes in mod complex tasks in 3 sessions    Status Not Met      SLP SHORT TERM GOAL #4   Title Pt will complete HEP for unintelligible speech with occaisonal mod A for articulation and loudnes    Baseline 04/17/20    Status Achieved            SLP Long Term Goals - 05/13/20 0943      SLP  LONG TERM GOAL #1   Title pt will demo selective attention to complete simple functional tasks in min noisy environment for 10 minutes    Time 3    Period Weeks   or 17 total sessions for all LTGs   Status On-going      SLP LONG TERM GOAL #2   Title pt will demo emergent awareness with mod complex tasks by double checkng work 100% of the time over three sessions    Time 3    Period Weeks    Status Revised      SLP LONG TERM GOAL #3   Title pt will demo speech volume in 8 minutes simple complex conversation, in low 70s dB average over three consecutive sessions    Time 3    Period Weeks    Status On-going      SLP LONG TERM GOAL #4   Title pt will produce 10 minutes intelligible simple-mod complex conversation using compensations, with rare min A over 3 consecutive sessions    Time 3    Period Weeks    Status On-going      SLP LONG TERM GOAL #5   Title Pt will complete HEP for unintelligible speech with occaisonal min A for articulation and loudness    Time 3    Period Weeks    Status On-going            Plan - 05/13/20 1056    Clinical Impression Statement Pt with some mild premorbid dysarthria. He presents today with mild-mod cognitive communication deficits characterized by decr'd attention and awareness, as well as significant dysarthria decreasing pt's speech intelligbility at the sentence and conversation levels. Patient reports he is now practicing HEP (once vs twice daily). Expressed interest in online activities so SLP set these up for patient. Pt would benefit from skilled ST targeting cognitive linguistics as well as dysarthria.    Speech Therapy Frequency 2x / week    Duration --   8 weeks or 17 total sessions   Treatment/Interventions Oral motor exercises;Compensatory techniques;Functional tasks;SLP instruction and feedback;Patient/family education;Internal/external aids;Cueing hierarchy;Cognitive reorganization    Potential to Achieve Goals Good    Potential  Considerations Severity of impairments           Patient will benefit from skilled therapeutic intervention in order to improve the following deficits and impairments:   Cognitive communication deficit  Dysarthria and anarthria    Problem List Patient Active Problem List  Diagnosis Date Noted  . Depressive reaction   . Sleep disturbance   . Urinary retention   . Bradycardia   . Essential hypertension   . Temperature elevated   . Left thyroid nodule   . Treatment-emergent central sleep apnea 10/25/2018  . 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 Chronic pain syndrome   . Chronic bilateral low back pain   . Benign essential HTN   . Tobacco abuse   . Marijuana abuse   . Hyperlipidemia   . History of CVA with residual deficit   . Dysphagia, post-stroke   . ICH (intracerebral hemorrhage) (HCC) - R thalmic/PLIC d/t HTN 26/20/3559  . Insomnia 07/13/2017  . PAD (peripheral artery disease) ([place]) 07/13/2017  . Hyperlipidemia with target LDL less than 70 07/13/2017  . Stroke-like symptom 09/22/2016  . Paresthesia 09/22/2016  . CVA (cerebral vascular accident) ([place] [place]) 09/22/2016  . Cerebrovascular accident (CVA) due to thrombosis of left carotid artery ([place]) 09/23/2015  . Primary snoring 09/23/2015  . Hypersomnia with sleep apnea 09/23/2015  . Obstructive sleep apnea 09/23/2015  . Lacunar infarct, acute ([place]) 08/25/2015  . Sleep apnea 08/25/2015  . Dysarthria   . CVD (cardiovascular disease) 08/02/2015  . Acute hyperglycemia 08/02/2015  . Systolic hypertension with cerebrovascular disease 12/27/2014  . Epistaxis 07/28/2012  . HYPERGLYCEMIA 10/02/2010  . NEPHROLITHIASIS 05/23/2009  . BARRETTS ESOPHAGUS 02/20/2009  . Gastroparesis 02/20/2009  . RADICULOPATHY 10/21/2008  . GERD 10/08/2008  . ESOPHAGITIS 08/15/2008  . ESOPHAGEAL STRICTURE 08/15/2008  . GASTRITIS, ACUTE 08/15/2008  . RENAL CYST 08/14/2008  . TOBACCO ABUSE 08/08/2008  .  HEMATURIA, MICROSCOPIC, HX OF 08/08/2008  . PULMONARY NODULE 01/10/2008   Deneise Lever, [place], [place] 05/13/2020, 10:58 AM  [facility] [address] [place], [place], [address] Phone: [phone]   Fax:  [phone]   Name: Richard Vaughn MRN: [id number] Date of Birth: November 08, 1951

## 2020-05-13 NOTE — Therapy (Signed)
[facility] [address] [place], [place], [address] Phone: [phone]   Fax:  [phone]  Physical Therapy Treatment  Patient Details  Name: Richard Vaughn MRN: [id number] Date of Birth: [birth year] Referring Provider (PT): Reesa Chew PA-C   Encounter Date: 05/13/2020   PT End of Session - 05/13/20 1024    Visit Number 16    Number of Visits 22    Date for PT Re-Evaluation 06/16/20   POC for 6 weeks, Cert for 90 days   Authorization Type Medicare + [employer]BS (10th visit PN)    Progress Note Due on Visit 10    PT Start Time 1018   pt finishing up with Speech Therapy   PT Stop Time 1100    PT Time Calculation (min) 42 min    Equipment Utilized During Treatment Gait belt    Activity Tolerance Patient tolerated treatment well    Behavior During Therapy WFL for tasks assessed/performed           Past Medical History:  Diagnosis Date  . Barrett's esophagus   . Chronic back pain   . Gastritis    Mild  . Gastroparesis   . GERD (gastroesophageal reflux disease)   . Headache   . Hyperglycemia   . Hypertension   . Nephrolithiasis   . Renal cyst   . Stricture and stenosis of esophagus   . Stroke ([place]) 07-2015  . Vision abnormalities     Past Surgical History:  Procedure Laterality Date  . [place] SURGERY  2012  . COLONOSCOPY    . [place] SURGERY  2005, 2010   replaced L4 and L5  . UPPER GASTROINTESTINAL ENDOSCOPY      There were no vitals filed for this visit.   Subjective Assessment - 05/13/20 1022    Subjective Patient reports doing well since last visit. No falls. Patient reports multiple near falls, due to the Left toe catching.    Patient is accompained by: Family member   Wife   Pertinent History Chronic Back Pain, Barrett's Esophagus, GERD, Hyperglycemia, HTN, CVA, Hyperlipidemia, vision abnormalities, ICH in the right thalamus/posterior limb of internal capsule    Limitations  Standing;Walking;House hold activities    Patient Stated Goals "want to walk", "get back to how I was"    Currently in Pain? Yes    Pain Score 3     Pain Location Back    Pain Orientation Lower    Pain Descriptors / Indicators Aching    Pain Type Chronic pain    Pain Onset More than a month ago                             [facility] Adult PT Treatment/Exercise - 05/13/20 0001      Transfers   Transfers Sit to Stand;Stand to Sit    Sit to Stand 5: Supervision    Stand to Sit 5: Supervision    Comments completed sit <> stand training from mat without UE support x 10 reps. PT providing verbal cues for slowed pace as patient gets frustrated during completion if makes mistakes. PT educating patient if make mistake, reset and try again, and also focus on deep breathing as needed with frustration.       Ambulation/Gait   Ambulation/Gait Yes    Ambulation/Gait Assistance 5: Supervision;4: Min guard    Ambulation/Gait Assistance Details completed all gait training w/o AD intiailly requiring CGA  from PT and progressed to supv and only require CGA for toe catching. Verbal cues for toe clearance.     Ambulation Distance (Feet) 1150 Feet    Assistive device None    Gait Pattern Step-through pattern;Decreased hip/knee flexion - left    Ambulation Surface Level;Indoor    Stairs Yes    Stairs Assistance 5: Supervision;4: Min guard    Stairs Assistance Details (indicate cue type and reason) patient continue to ascend with alternating pattern, and descent with step to pattern.     Stair Management Technique Two rails;Alternating pattern;Step to pattern    Number of Stairs 8    Height of Stairs 6      High Level Balance   High Level Balance Activities Negotiating over obstacles    High Level Balance Comments Compelted negotiating over obstalces, includign stepping over orange hurdles, 30' x 5 laps.      Neuro Re-ed    Neuro Re-ed Details  Standing on firm surface, completed  alternating toe taps to various colored targets. PT naming color and foot with patient having to complete task. Patient able to complete appropriately, did demo difficulty with balance on crossovers. Completed 2 x 10 reps. CGA as needed for steadying      Knee/Hip Exercises: Standing   Forward Step Up Left;2 sets;10 reps;Hand Hold: 1;Step Height: 6"    Forward Step Up Limitations completed at steps with single UE support.                     PT Short Term Goals - 05/08/20 1621      PT SHORT TERM GOAL #1   Title Pt will ambulate >600' on level surfaces mod I for improved mobility.    Baseline Ambulated 690 Mod I with Rollator    Time 3    Period Weeks    Status Achieved    Target Date 05/08/20      PT SHORT TERM GOAL #2   Title Pt will decrease 5x sit to stand from 24 sec to <22 sec for improved balance and functional strength.    Baseline 21.25 secs w/o UE support from mat    Time 3    Period Weeks    Status Achieved    Target Date 05/08/20             PT Long Term Goals - 04/17/20 1352      PT LONG TERM GOAL #1   Title Patient will be independent with final HEP    Baseline PT continues to add to HEP    Time 6    Period Weeks    Status On-going    Target Date 05/29/20      PT LONG TERM GOAL #2   Title Pt will increase Berg from 44/56 to 47/56 or more for improved balance and decreased fall risk.    Baseline 36/56, 04/17/20 44/56    Time 6    Period Weeks    Status New    Target Date 05/29/20      PT LONG TERM GOAL #3   Title Pt will improve gait speed to >/=2.0 ft/sec. with LRAD to safely amb. in the community.    Baseline 1.36 ft    Time 6    Period Weeks    Status On-going    Target Date 05/29/20      PT LONG TERM GOAL #4   Title Pt will ambulate >800' on varies surfaces supervision for improved community  mobility.    Time 6    Period Weeks    Status Revised    Target Date 05/29/20                 Plan - 05/13/20 1407    Clinical  Impression Statement Today's skilled PT session focused on extensive gait trianing without AD. Patient demo improved endurance, still require intermittent verbal cues for improved step length and toe clearance. Due to dificulty with toe clearance, pt demo difficulty with clearing obstacles.  Continued balance actvities, focused on promoting SLS and weight shift. Pt will continue to benefit from skilled PT services to progress toward all unmet goals.    Personal Factors and Comorbidities Comorbidity 3+    Comorbidities Chronic Back Pain, Barrett's Esophagus, GERD, Hyperglycemia, HTN, CVA, Hyperlipidemia, vision abnormalities, ICH in the right thalamus/posterior limb of internal capsule    Examination-Activity Limitations Bed Mobility;Transfers;Stairs;Stand    Examination-Participation Restrictions Yard Work;Community Activity    Stability/Clinical Decision Making Evolving/Moderate complexity    Rehab Potential Good    PT Frequency 2x / week    PT Duration 6 weeks    PT Treatment/Interventions ADLs/Self Care Home Management;Electrical Stimulation;Moist Heat;Cryotherapy;Gait training;Stair training;Functional mobility training;Therapeutic activities;DME Instruction;Therapeutic exercise;Balance training;Neuromuscular re-education;Patient/family education;Orthotic Fit/Training;Passive range of motion;Visual/perceptual remediation/compensation    PT Next Visit Plan Continue gait w/o AD, dynamic gait as tolerated. Focus on improving left foot clearance. Strengthening exercises adding in more left hip extension, Standing Balance.    Consulted and Agree with Plan of Care Patient;Family member/caregiver    Family Member Consulted Wife           Patient will benefit from skilled therapeutic intervention in order to improve the following deficits and impairments:  Abnormal gait, Decreased balance, Decreased endurance, Decreased mobility, Difficulty walking, Impaired vision/preception, Decreased knowledge of  precautions, Decreased strength, Decreased safety awareness, Decreased knowledge of use of DME, Decreased activity tolerance  Visit Diagnosis: Unsteadiness on feet  Muscle weakness (generalized)  Other lack of coordination  Other abnormalities of gait and mobility     Problem List Patient Active Problem List   Diagnosis Date Noted  . Depressive reaction   . Sleep disturbance   . Urinary retention   . Bradycardia   . Essential hypertension   . Temperature elevated   . Left thyroid nodule   . Treatment-emergent central sleep apnea 10/25/2018  . 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 Chronic pain syndrome   . Chronic bilateral low back pain   . Benign essential HTN   . Tobacco abuse   . Marijuana abuse   . Hyperlipidemia   . History of CVA with residual deficit   . Dysphagia, post-stroke   . ICH (intracerebral hemorrhage) (HCC) - R thalmic/PLIC d/t HTN 99/37/1696  . Insomnia 07/13/2017  . PAD (peripheral artery disease) ([place]) 07/13/2017  . Hyperlipidemia with target LDL less than 70 07/13/2017  . Stroke-like symptom 09/22/2016  . Paresthesia 09/22/2016  . CVA (cerebral vascular accident) ([place]) 09/22/2016  . Cerebrovascular accident (CVA) due to thrombosis of left carotid artery ([place]) 09/23/2015  . Primary snoring 09/23/2015  . Hypersomnia with sleep apnea 09/23/2015  . Obstructive sleep apnea 09/23/2015  . Lacunar infarct, acute ([place]) 08/25/2015  . Sleep apnea 08/25/2015  . Dysarthria   . CVD (cardiovascular disease) 08/02/2015  . Acute hyperglycemia 08/02/2015  . Systolic hypertension with cerebrovascular disease 12/27/2014  . Epistaxis 07/28/2012  . HYPERGLYCEMIA 10/02/2010  . NEPHROLITHIASIS 05/23/2009  . BARRETTS ESOPHAGUS 02/20/2009  .  Gastroparesis 02/20/2009  . RADICULOPATHY 10/21/2008  . GERD 10/08/2008  . ESOPHAGITIS 08/15/2008  . ESOPHAGEAL STRICTURE 08/15/2008  . GASTRITIS, ACUTE 08/15/2008  . RENAL CYST  08/14/2008  . TOBACCO ABUSE 08/08/2008  . HEMATURIA, MICROSCOPIC, HX OF 08/08/2008  . PULMONARY NODULE 01/10/2008    Jones Bales, PT, DPT 05/13/2020, 2:12 PM  [facility] [address] [place], [place], [address] Phone: [phone]   Fax:  [phone]  Name: SEAB AXEL MRN: [id number] Date of Birth: [birth year]

## 2020-05-15 ENCOUNTER — Ambulatory Visit: Payer: Medicare Other | Attending: Family Medicine | Admitting: Speech Pathology

## 2020-05-15 ENCOUNTER — Other Ambulatory Visit: Payer: Self-pay

## 2020-05-15 DIAGNOSIS — R41841 Cognitive communication deficit: Secondary | ICD-10-CM | POA: Diagnosis not present

## 2020-05-15 DIAGNOSIS — R270 Ataxia, unspecified: Secondary | ICD-10-CM | POA: Insufficient documentation

## 2020-05-15 DIAGNOSIS — R29818 Other symptoms and signs involving the nervous system: Secondary | ICD-10-CM | POA: Insufficient documentation

## 2020-05-15 DIAGNOSIS — R2681 Unsteadiness on feet: Secondary | ICD-10-CM | POA: Diagnosis not present

## 2020-05-15 DIAGNOSIS — I69354 Hemiplegia and hemiparesis following cerebral infarction affecting left non-dominant side: Secondary | ICD-10-CM | POA: Diagnosis not present

## 2020-05-15 DIAGNOSIS — R278 Other lack of coordination: Secondary | ICD-10-CM | POA: Insufficient documentation

## 2020-05-15 DIAGNOSIS — R471 Dysarthria and anarthria: Secondary | ICD-10-CM | POA: Insufficient documentation

## 2020-05-15 DIAGNOSIS — G8929 Other chronic pain: Secondary | ICD-10-CM | POA: Diagnosis not present

## 2020-05-15 DIAGNOSIS — R293 Abnormal posture: Secondary | ICD-10-CM | POA: Diagnosis not present

## 2020-05-15 DIAGNOSIS — M6281 Muscle weakness (generalized): Secondary | ICD-10-CM | POA: Insufficient documentation

## 2020-05-15 DIAGNOSIS — M25512 Pain in left shoulder: Secondary | ICD-10-CM | POA: Insufficient documentation

## 2020-05-15 DIAGNOSIS — R1310 Dysphagia, unspecified: Secondary | ICD-10-CM | POA: Diagnosis not present

## 2020-05-15 DIAGNOSIS — R41842 Visuospatial deficit: Secondary | ICD-10-CM | POA: Diagnosis not present

## 2020-05-15 DIAGNOSIS — R2689 Other abnormalities of gait and mobility: Secondary | ICD-10-CM | POA: Diagnosis not present

## 2020-05-15 NOTE — Patient Instructions (Signed)
Get a pill planner (one with slots for AM and PM). Bring your phone and your calendar with you next time.

## 2020-05-16 NOTE — Therapy (Signed)
[facility] [address] [place], [place], [address] Phone: [phone]   Fax:  [phone]  Speech Language Pathology Treatment  Patient Details  Name: Richard Vaughn MRN: [id number] Date of Birth: 1951/03/03 Referring Provider (SLP): Reesa Chew (referral), [place], Betty, MD (documentation)   Encounter Date: 05/15/2020   End of Session - 05/16/20 0942    Visit Number 12    Number of Visits 17    Date for SLP Re-Evaluation 06/10/20    SLP Start Time 5732    SLP Stop Time  2025    SLP Time Calculation (min) 32 min           Past Medical History:  Diagnosis Date  . Barrett's esophagus   . Chronic back pain   . Gastritis    Mild  . Gastroparesis   . GERD (gastroesophageal reflux disease)   . Headache   . Hyperglycemia   . Hypertension   . Nephrolithiasis   . Renal cyst   . Stricture and stenosis of esophagus   . Stroke ([facility]) 07-2015  . Vision abnormalities     Past Surgical History:  Procedure Laterality Date  . [place] SURGERY  2012  . COLONOSCOPY    . [place] SURGERY  2005, 2010   replaced L4 and L5  . UPPER GASTROINTESTINAL ENDOSCOPY      There were no vitals filed for this visit.   Subjective Assessment - 05/16/20 0936    Subjective "Sorry I'm late"    Currently in Pain? Yes    Pain Score 3     Pain Location Back    Pain Orientation Lower    Pain Descriptors / Indicators Aching    Pain Type Chronic pain                 ADULT SLP TREATMENT - 05/16/20 0937      General Information   Behavior/Cognition Alert;Cooperative;Pleasant mood      Treatment Provided   Treatment provided Cognitive-Linquistic      Cognitive-Linquistic Treatment   Treatment focused on Cognition;Dysarthria    Skilled Treatment SLP focused session on cognition; Talk[facility] Therapy log showed 11 min practice Wednesday and 25 min practice Thursday. Targeted selective attention, memory strategies with  word recall + distractions. Patient required mod cues initially for use of repetition (also used dysarthria comps when speaking), fading to rare min A. Accuracy was 68% for 4 words from F:8. Discussed with pt how attention deficits may impact him at home; patient reports he is managing medications and admits he might miss a medication or forget if he's taken it, "maybe once a week." SLP introduced strategies pt can use to improve his accuracy and safety with this. He is not using a pill planner; did not bring his phone today to set and alert but SLP provided handout with this recommendation and pt to bring phone next session.      Assessment / Recommendations / Plan   Plan Continue with current plan of care      Progression Toward Goals   Progression toward goals Progressing toward goals            SLP Education - 05/16/20 0941    Education Details strategies for attention, recall, use of alerts and pill planner    Person(s) Educated Patient    Methods Explanation;Handout    Comprehension Verbalized understanding            SLP Short Term Goals -  05/15/20 [place] #1   Title pt will improve emergent awareness with simple functional tasks to requiring min A rarely over three sessions    Period --   or 9 initital sessions for all STGs   Status Not Met      SLP SHORT TERM GOAL #2   Title pt will demo compensations for dysarthria in simple sentence responses generating at lest 90% intelligibility over 3 sessions    Baseline 04/17/20    Status Partially Met      SLP SHORT TERM GOAL #3   Title pt will demonstrate sustained attention for 15 minutes in mod complex tasks in 3 sessions    Status Not Met      SLP SHORT TERM GOAL #4   Title Pt will complete HEP for unintelligible speech with occaisonal mod A for articulation and loudnes    Baseline 04/17/20    Status Achieved            SLP Long Term Goals - 05/15/20 1439      SLP LONG TERM GOAL #1   Title pt  will demo selective attention to complete simple functional tasks in min noisy environment for 10 minutes    Time 3    Period Weeks   or 17 total sessions for all LTGs   Status On-going      SLP LONG TERM GOAL #2   Title pt will demo emergent awareness with mod complex tasks by double checkng work 100% of the time over three sessions    Time 3    Period Weeks    Status Revised      SLP LONG TERM GOAL #3   Title pt will demo speech volume in 8 minutes simple complex conversation, in low 70s dB average over three consecutive sessions    Time 3    Period Weeks    Status On-going      SLP LONG TERM GOAL #4   Title pt will produce 10 minutes intelligible simple-mod complex conversation using compensations, with rare min A over 3 consecutive sessions    Time 3    Period Weeks    Status On-going      SLP LONG TERM GOAL #5   Title Pt will complete HEP for unintelligible speech with occaisonal min A for articulation and loudness    Time 3    Period Weeks    Status On-going            Plan - 05/16/20 5170    Clinical Impression Statement Pt with some mild premorbid dysarthria. He presents today with mild-mod cognitive communication deficits characterized by decr'd attention and awareness, as well as significant dysarthria decreasing pt's speech intelligbility at the sentence and conversation levels. Patient reports he is now practicing HEP (once vs twice daily). Log shows pt practice with cognitive linguistics average of 18 min last 2 days. Pt would benefit from skilled ST targeting cognitive linguistics as well as dysarthria.    Speech Therapy Frequency 2x / week    Duration --   8 weeks or 17 total sessions   Treatment/Interventions Oral motor exercises;Compensatory techniques;Functional tasks;SLP instruction and feedback;Patient/family education;Internal/external aids;Cueing hierarchy;Cognitive reorganization    Potential to Achieve Goals Good    Potential Considerations Severity of  impairments           Patient will benefit from skilled therapeutic intervention in order to improve the following deficits and impairments:   Cognitive  communication deficit  Dysarthria and anarthria    Problem List Patient Active Problem List   Diagnosis Date Noted  . Depressive reaction   . Sleep disturbance   . Urinary retention   . Bradycardia   . Essential hypertension   . Temperature elevated   . Left thyroid nodule   . Treatment-emergent central sleep apnea 10/25/2018  . 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 Chronic pain syndrome   . Chronic bilateral low back pain   . Benign essential HTN   . Tobacco abuse   . Marijuana abuse   . Hyperlipidemia   . History of CVA with residual deficit   . Dysphagia, post-stroke   . ICH (intracerebral hemorrhage) (HCC) - R thalmic/PLIC d/t HTN 91/69/4503  . Insomnia 07/13/2017  . PAD (peripheral artery disease) ([facility] [facility]) 07/13/2017  . Hyperlipidemia with target LDL less than 70 07/13/2017  . Stroke-like symptom 09/22/2016  . Paresthesia 09/22/2016  . CVA (cerebral vascular accident) ([place]) 09/22/2016  . Cerebrovascular accident (CVA) due to thrombosis of left carotid artery ([place]) 09/23/2015  . Primary snoring 09/23/2015  . Hypersomnia with sleep apnea 09/23/2015  . Obstructive sleep apnea 09/23/2015  . Lacunar infarct, acute ([place]) 08/25/2015  . Sleep apnea 08/25/2015  . Dysarthria   . CVD (cardiovascular disease) 08/02/2015  . Acute hyperglycemia 08/02/2015  . Systolic hypertension with cerebrovascular disease 12/27/2014  . Epistaxis 07/28/2012  . HYPERGLYCEMIA 10/02/2010  . NEPHROLITHIASIS 05/23/2009  . BARRETTS ESOPHAGUS 02/20/2009  . Gastroparesis 02/20/2009  . RADICULOPATHY 10/21/2008  . GERD 10/08/2008  . ESOPHAGITIS 08/15/2008  . ESOPHAGEAL STRICTURE 08/15/2008  . GASTRITIS, ACUTE 08/15/2008  . RENAL CYST 08/14/2008  . TOBACCO ABUSE 08/08/2008  . HEMATURIA, MICROSCOPIC, HX  OF 08/08/2008  . PULMONARY NODULE 01/10/2008   Deneise Lever, [place], [place] Speech-Language Pathologist  Aliene Altes 05/16/2020, 9:45 AM  [facility] [address] [place], [place], [address] Phone: [phone]   Fax:  [phone]   Name: HENOK HEACOCK MRN: [id number] Date of Birth: 14-May-1951

## 2020-05-18 ENCOUNTER — Other Ambulatory Visit: Payer: Self-pay | Admitting: Physical Medicine and Rehabilitation

## 2020-05-18 DIAGNOSIS — G4700 Insomnia, unspecified: Secondary | ICD-10-CM

## 2020-05-20 ENCOUNTER — Ambulatory Visit: Payer: Medicare Other

## 2020-05-20 ENCOUNTER — Ambulatory Visit: Payer: Medicare Other | Admitting: Occupational Therapy

## 2020-05-20 ENCOUNTER — Other Ambulatory Visit: Payer: Self-pay

## 2020-05-20 ENCOUNTER — Other Ambulatory Visit: Payer: Self-pay | Admitting: Physical Medicine and Rehabilitation

## 2020-05-20 DIAGNOSIS — R278 Other lack of coordination: Secondary | ICD-10-CM

## 2020-05-20 DIAGNOSIS — I69354 Hemiplegia and hemiparesis following cerebral infarction affecting left non-dominant side: Secondary | ICD-10-CM

## 2020-05-20 DIAGNOSIS — M6281 Muscle weakness (generalized): Secondary | ICD-10-CM

## 2020-05-20 DIAGNOSIS — G4700 Insomnia, unspecified: Secondary | ICD-10-CM

## 2020-05-20 DIAGNOSIS — R2681 Unsteadiness on feet: Secondary | ICD-10-CM | POA: Diagnosis not present

## 2020-05-20 DIAGNOSIS — G8929 Other chronic pain: Secondary | ICD-10-CM

## 2020-05-20 DIAGNOSIS — R471 Dysarthria and anarthria: Secondary | ICD-10-CM

## 2020-05-20 DIAGNOSIS — R41841 Cognitive communication deficit: Secondary | ICD-10-CM

## 2020-05-20 DIAGNOSIS — R1310 Dysphagia, unspecified: Secondary | ICD-10-CM | POA: Diagnosis not present

## 2020-05-20 DIAGNOSIS — R2689 Other abnormalities of gait and mobility: Secondary | ICD-10-CM

## 2020-05-20 DIAGNOSIS — R41842 Visuospatial deficit: Secondary | ICD-10-CM

## 2020-05-20 NOTE — Patient Instructions (Signed)
  Fill your med box, but have Mrs. Ranganathan look it over after you double check it.

## 2020-05-20 NOTE — Therapy (Signed)
[facility] [address] [place], [place], [address] Phone: [phone]   Fax:  [phone]  Physical Therapy Treatment  Patient Details  Name: Richard Vaughn MRN: [id number] Date of Birth: 1951/06/10 Referring Provider (PT): Reesa Chew PA-C   Encounter Date: 05/20/2020   PT End of Session - 05/20/20 1243    Visit Number 17    Number of Visits 22    Date for PT Re-Evaluation 06/16/20   POC for 6 weeks, Cert for 90 days   Authorization Type Medicare + BCBS (10th visit PN)    Progress Note Due on Visit 10    PT Start Time 1238   pt arriving late   PT Stop Time 1315    PT Time Calculation (min) 37 min    Equipment Utilized During Treatment Gait belt    Activity Tolerance Patient tolerated treatment well    Behavior During Therapy WFL for tasks assessed/performed           Past Medical History:  Diagnosis Date  . Barrett's esophagus   . Chronic back pain   . Gastritis    Mild  . Gastroparesis   . GERD (gastroesophageal reflux disease)   . Headache   . Hyperglycemia   . Hypertension   . Nephrolithiasis   . Renal cyst   . Stricture and stenosis of esophagus   . Stroke ([place]) 07-2015  . Vision abnormalities     Past Surgical History:  Procedure Laterality Date  . [place] SURGERY  2012  . COLONOSCOPY    . [place] SURGERY  2005, 2010   replaced L4 and L5  . UPPER GASTROINTESTINAL ENDOSCOPY      There were no vitals filed for this visit.   Subjective Assessment - 05/20/20 1242    Subjective Patient reports no new complaints/concerns since last visit. No falls to report.    Patient is accompained by: Family member   Wife   Pertinent History Chronic Back Pain, Barrett's Esophagus, GERD, Hyperglycemia, HTN, CVA, Hyperlipidemia, vision abnormalities, ICH in the right thalamus/posterior limb of internal capsule    Limitations Standing;Walking;House hold activities    Patient Stated Goals "want to  walk", "get back to how I was"    Currently in Pain? Yes    Pain Score 3     Pain Location Back    Pain Orientation Lower    Pain Descriptors / Indicators Aching    Pain Type Chronic pain    Pain Onset More than a month ago                             [facility] Adult PT Treatment/Exercise - 05/20/20 0001      Transfers   Transfers Sit to Stand;Stand to Sit    Sit to Stand 5: Supervision    Stand to Sit 5: Supervision    Comments completed x 5 reps throughout session from mat. Pt demo increased difficuly but initially but able to self correct.       Ambulation/Gait   Ambulation/Gait Yes    Ambulation/Gait Assistance 5: Supervision;4: Min guard    Ambulation/Gait Assistance Details all gait training completed w/o AD. Pt providing verbal cues for improved step length and heel toe pattern to reduce difficulty with toe clearance.     Ambulation Distance (Feet) 850 Feet    Assistive device None    Gait Pattern Step-through pattern;Decreased hip/knee flexion - left  Ambulation Surface Level;Indoor    Stairs Yes    Stairs Assistance 5: Supervision;4: Min guard    Stairs Assistance Details (indicate cue type and reason) continued alternating pattern with ascending stairs, step to pattern with descent with bilateral use of rails.     Stair Management Technique Two rails;Alternating pattern;Step to pattern    Number of Stairs 12    Height of Stairs 6      High Level Balance   High Level Balance Activities Head turns    High Level Balance Comments completed horizontal/vertical head turns with gait, 20' x 4 reps of each.       Neuro Re-ed    Neuro Re-ed Details  Standing on foam airex pad at stairs with BUE completed alternating toe tap to second step, progressed to using 1 UE support with completion. Attempted to no UE support but patient able               Balance Exercises - 05/20/20 0001      Balance Exercises: Standing   Standing Eyes Opened Narrow base of  support (BOS);Head turns;2 reps;30 secs;Limitations    Standing Eyes Opened Limitations completed 1 x 10 reps of horizontal/vertical head turns.     Standing Eyes Closed Narrow base of support (BOS);Foam/compliant surface;2 reps;30 secs               PT Short Term Goals - 05/08/20 1621      PT SHORT TERM GOAL #1   Title Pt will ambulate >600' on level surfaces mod I for improved mobility.    Baseline Ambulated 690 Mod I with Rollator    Time 3    Period Weeks    Status Achieved    Target Date 05/08/20      PT SHORT TERM GOAL #2   Title Pt will decrease 5x sit to stand from 24 sec to <22 sec for improved balance and functional strength.    Baseline 21.25 secs w/o UE support from mat    Time 3    Period Weeks    Status Achieved    Target Date 05/08/20             PT Long Term Goals - 04/17/20 1352      PT LONG TERM GOAL #1   Title Patient will be independent with final HEP    Baseline PT continues to add to HEP    Time 6    Period Weeks    Status On-going    Target Date 05/29/20      PT LONG TERM GOAL #2   Title Pt will increase Berg from 44/56 to 47/56 or more for improved balance and decreased fall risk.    Baseline 36/56, 04/17/20 44/56    Time 6    Period Weeks    Status New    Target Date 05/29/20      PT LONG TERM GOAL #3   Title Pt will improve gait speed to >/=2.0 ft/sec. with LRAD to safely amb. in the community.    Baseline 1.36 ft    Time 6    Period Weeks    Status On-going    Target Date 05/29/20      PT LONG TERM GOAL #4   Title Pt will ambulate >800' on varies surfaces supervision for improved community mobility.    Time 6    Period Weeks    Status Revised    Target Date 05/29/20  Plan - 05/20/20 1411    Clinical Impression Statement Continued gait training without AD with CGA from PT. continued dyanmic balanace actvities including head turns. Patient continues to demo difficulty with heel toe pattern with dual  tasking. Pt will continue to benefit from skilled PT services to progress toward goals.    Personal Factors and Comorbidities Comorbidity 3+    Comorbidities Chronic Back Pain, Barrett's Esophagus, GERD, Hyperglycemia, HTN, CVA, Hyperlipidemia, vision abnormalities, ICH in the right thalamus/posterior limb of internal capsule    Examination-Activity Limitations Bed Mobility;Transfers;Stairs;Stand    Examination-Participation Restrictions Yard Work;Community Activity    Stability/Clinical Decision Making Evolving/Moderate complexity    Rehab Potential Good    PT Frequency 2x / week    PT Duration 6 weeks    PT Treatment/Interventions ADLs/Self Care Home Management;Electrical Stimulation;Moist Heat;Cryotherapy;Gait training;Stair training;Functional mobility training;Therapeutic activities;DME Instruction;Therapeutic exercise;Balance training;Neuromuscular re-education;Patient/family education;Orthotic Fit/Training;Passive range of motion;Visual/perceptual remediation/compensation    PT Next Visit Plan Continue gait w/o AD, dynamic gait as tolerated. Focus on improving left foot clearance. Strengthening exercises adding in more left hip extension, Standing Balance.    Consulted and Agree with Plan of Care Patient;Family member/caregiver    Family Member Consulted Wife           Patient will benefit from skilled therapeutic intervention in order to improve the following deficits and impairments:  Abnormal gait, Decreased balance, Decreased endurance, Decreased mobility, Difficulty walking, Impaired vision/preception, Decreased knowledge of precautions, Decreased strength, Decreased safety awareness, Decreased knowledge of use of DME, Decreased activity tolerance  Visit Diagnosis: Unsteadiness on feet  Muscle weakness (generalized)  Other lack of coordination  Other abnormalities of gait and mobility     Problem List Patient Active Problem List   Diagnosis Date Noted  . Depressive  reaction   . Sleep disturbance   . Urinary retention   . Bradycardia   . Essential hypertension   . Temperature elevated   . Left thyroid nodule   . Treatment-emergent central sleep apnea 10/25/2018  . 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 Chronic pain syndrome   . Chronic bilateral low back pain   . Benign essential HTN   . Tobacco abuse   . Marijuana abuse   . Hyperlipidemia   . History of CVA with residual deficit   . Dysphagia, post-stroke   . ICH (intracerebral hemorrhage) (HCC) - R thalmic/PLIC d/t HTN 73/22/0254  . Insomnia 07/13/2017  . PAD (peripheral artery disease) ([place]) 07/13/2017  . Hyperlipidemia with target LDL less than 70 07/13/2017  . Stroke-like symptom 09/22/2016  . Paresthesia 09/22/2016  . CVA (cerebral vascular accident) ([place]) 09/22/2016  . Cerebrovascular accident (CVA) due to thrombosis of left carotid artery ([place]) 09/23/2015  . Primary snoring 09/23/2015  . Hypersomnia with sleep apnea 09/23/2015  . Obstructive sleep apnea 09/23/2015  . Lacunar infarct, acute ([place]) 08/25/2015  . Sleep apnea 08/25/2015  . Dysarthria   . CVD (cardiovascular disease) 08/02/2015  . Acute hyperglycemia 08/02/2015  . Systolic hypertension with cerebrovascular disease 12/27/2014  . Epistaxis 07/28/2012  . HYPERGLYCEMIA 10/02/2010  . NEPHROLITHIASIS 05/23/2009  . BARRETTS ESOPHAGUS 02/20/2009  . Gastroparesis 02/20/2009  . RADICULOPATHY 10/21/2008  . GERD 10/08/2008  . ESOPHAGITIS 08/15/2008  . ESOPHAGEAL STRICTURE 08/15/2008  . GASTRITIS, ACUTE 08/15/2008  . RENAL CYST 08/14/2008  . TOBACCO ABUSE 08/08/2008  . HEMATURIA, MICROSCOPIC, HX OF 08/08/2008  . PULMONARY NODULE 01/10/2008    Jones Bales, PT, DPT 05/20/2020, 2:14 PM  [facility]  [facility] [address] [place], [place], [address] Phone: [phone]   Fax:  [phone]  Name: Richard Vaughn MRN:  [id number] Date of Birth: 29-Sep-1951

## 2020-05-20 NOTE — Therapy (Signed)
[facility] [address] [place], [place], [address] Phone: [phone]   Fax:  [phone]  Speech Language Pathology Treatment  Patient Details  Name: Richard Vaughn MRN: [id number] Date of Birth: [birth year] Referring Provider (SLP): Reesa Chew (referral), [place], Betty, MD (documentation)   Encounter Date: 05/20/2020   End of Session - 05/20/20 1702    Visit Number 13    Number of Visits 17    Date for SLP Re-Evaluation 06/10/20    SLP Start Time 1404    SLP Stop Time  1445    SLP Time Calculation (min) 41 min    Activity Tolerance Patient tolerated treatment well           Past Medical History:  Diagnosis Date  . Barrett's esophagus   . Chronic back pain   . Gastritis    Mild  . Gastroparesis   . GERD (gastroesophageal reflux disease)   . Headache   . Hyperglycemia   . Hypertension   . Nephrolithiasis   . Renal cyst   . Stricture and stenosis of esophagus   . Stroke ([place]) 07-2015  . Vision abnormalities     Past Surgical History:  Procedure Laterality Date  . [place] SURGERY  2012  . COLONOSCOPY    . [place] SURGERY  2005, 2010   replaced L4 and L5  . UPPER GASTROINTESTINAL ENDOSCOPY      There were no vitals filed for this visit.   Subjective Assessment - 05/20/20 1413    Subjective Pt req'd cues re: need to bring phone today.    Patient is accompained by: Family member   wife   Currently in Pain? Yes    Pain Score 3     Pain Location Back    Pain Orientation Lower    Pain Descriptors / Indicators Aching    Pain Type Chronic pain    Pain Onset More than a month ago                 ADULT SLP TREATMENT - 05/20/20 1414      General Information   Behavior/Cognition Alert;Cooperative;Pleasant mood      Treatment Provided   Treatment provided Cognitive-Linquistic      Cognitive-Linquistic Treatment   Treatment focused on Cognition;Dysarthria    Skilled Treatment  SLP req'd to provide cues for pt for bringing his phone. "I don't want to be tied to my phone," pt stated. SLP assisted pt's reasoning he would not be "tied to his phone" he would just need to hear the alarm. SLP assited pt (total A) in setting a PM alarm for 5 pm. SLP engaged pt in a practical attention task filling his med box; pt with decr'd attention to detail without emergent awareness and pt did not double check his work. Once called to pt's attention he knew immeidately what he had done. SLP told pt wife to have her check over pt's work 30-60 minutes after pt completes filling his med box.       Assessment / Recommendations / Plan   Plan Continue with current plan of care      Progression Toward Goals   Progression toward goals Progressing toward goals            SLP Education - 05/20/20 1702    Education Details how to fill med box -double check and have wife check as well    Person(s) Educated Patient;Spouse    Methods Explanation  Comprehension Verbalized understanding            SLP Short Term Goals - 05/15/20 1438      SLP SHORT TERM GOAL #1   Title pt will improve emergent awareness with simple functional tasks to requiring min A rarely over three sessions    Period --   or 9 initital sessions for all STGs   Status Not Met      SLP SHORT TERM GOAL #2   Title pt will demo compensations for dysarthria in simple sentence responses generating at lest 90% intelligibility over 3 sessions    Baseline 04/17/20    Status Partially Met      SLP SHORT TERM GOAL #3   Title pt will demonstrate sustained attention for 15 minutes in mod complex tasks in 3 sessions    Status Not Met      SLP SHORT TERM GOAL #4   Title Pt will complete HEP for unintelligible speech with occaisonal mod A for articulation and loudnes    Baseline 04/17/20    Status Achieved            SLP Long Term Goals - 05/20/20 1704      SLP LONG TERM GOAL #1   Title pt will demo selective attention to  complete simple functional tasks in min noisy environment for 10 minutes    Time 2    Period Weeks   or 17 total sessions for all LTGs   Status On-going      SLP LONG TERM GOAL #2   Title pt will demo emergent awareness with mod complex tasks by double checkng work 100% of the time over three sessions    Time 2    Period Weeks    Status Revised      SLP LONG TERM GOAL #3   Title pt will demo speech volume in 8 minutes simple complex conversation, in low 70s dB average over three consecutive sessions    Time 2    Period Weeks    Status On-going      SLP LONG TERM GOAL #4   Title pt will produce 10 minutes intelligible simple-mod complex conversation using compensations, with rare min A over 3 consecutive sessions    Time 2    Period Weeks    Status On-going      SLP LONG TERM GOAL #5   Title Pt will complete HEP for unintelligible speech with occaisonal min A for articulation and loudness    Time 2    Period Weeks    Status On-going            Plan - 05/20/20 1702    Clinical Impression Statement Pt with premorbid dysarthria- mild. Today moderate-severe at times. He also cont to present today with mild-mod cognitive communication deficits characterized by decr'd attention and awareness. Pt would benefit from skilled ST targeting cognitive linguistics as well as dysarthria.    Speech Therapy Frequency 2x / week    Duration --   8 weeks or 17 total sessions   Treatment/Interventions Oral motor exercises;Compensatory techniques;Functional tasks;SLP instruction and feedback;Patient/family education;Internal/external aids;Cueing hierarchy;Cognitive reorganization    Potential to Achieve Goals Good    Potential Considerations Severity of impairments           Patient will benefit from skilled therapeutic intervention in order to improve the following deficits and impairments:   Dysarthria and anarthria  Cognitive communication deficit    Problem List Patient Active  Problem List  Diagnosis Date Noted  . Depressive reaction   . Sleep disturbance   . Urinary retention   . Bradycardia   . Essential hypertension   . Temperature elevated   . Left thyroid nodule   . Treatment-emergent central sleep apnea 10/25/2018  . 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 Chronic pain syndrome   . Chronic bilateral low back pain   . Benign essential HTN   . Tobacco abuse   . Marijuana abuse   . Hyperlipidemia   . History of CVA with residual deficit   . Dysphagia, post-stroke   . ICH (intracerebral hemorrhage) (HCC) - R thalmic/PLIC d/t HTN 56/38/7564  . Insomnia 07/13/2017  . PAD (peripheral artery disease) ([place]) 07/13/2017  . Hyperlipidemia with target LDL less than 70 07/13/2017  . Stroke-like symptom 09/22/2016  . Paresthesia 09/22/2016  . CVA (cerebral vascular accident) ([place]) 09/22/2016  . Cerebrovascular accident (CVA) due to thrombosis of left carotid artery ([place]) 09/23/2015  . Primary snoring 09/23/2015  . Hypersomnia with sleep apnea 09/23/2015  . Obstructive sleep apnea 09/23/2015  . Lacunar infarct, acute ([place]) 08/25/2015  . Sleep apnea 08/25/2015  . Dysarthria   . CVD (cardiovascular disease) 08/02/2015  . Acute hyperglycemia 08/02/2015  . Systolic hypertension with cerebrovascular disease 12/27/2014  . Epistaxis 07/28/2012  . HYPERGLYCEMIA 10/02/2010  . NEPHROLITHIASIS 05/23/2009  . BARRETTS ESOPHAGUS 02/20/2009  . Gastroparesis 02/20/2009  . RADICULOPATHY 10/21/2008  . GERD 10/08/2008  . ESOPHAGITIS 08/15/2008  . ESOPHAGEAL STRICTURE 08/15/2008  . GASTRITIS, ACUTE 08/15/2008  . RENAL CYST 08/14/2008  . TOBACCO ABUSE 08/08/2008  . HEMATURIA, MICROSCOPIC, HX OF 08/08/2008  . PULMONARY NODULE 01/10/2008    [facility] ,[place], [place]  05/20/2020, 5:05 PM  [facility] [address] [place], [place], [address] Phone: [phone]   Fax:   [phone]   Name: Richard Vaughn MRN: [id number] Date of Birth: 1951-11-14

## 2020-05-21 NOTE — Therapy (Signed)
[facility] [address] [place], [place], [address] Phone: [phone]   Fax:  [phone]  Occupational Therapy Treatment  Patient Details  Name: Richard Vaughn MRN: [id number] Date of Birth: 31-May-1951 Referring Provider (OT): Reesa Chew, [place]   Encounter Date: 05/20/2020   OT End of Session - 05/20/20 1320    Visit Number 14    Number of Visits 25    Date for OT Re-Evaluation 06/26/20    Authorization Type MCR, BS/BS    Authorization - Visit Number 34    Authorization - Number of Visits 20    OT Start Time 1318    OT Stop Time 1358    OT Time Calculation (min) 40 min    Activity Tolerance Patient tolerated treatment well           Past Medical History:  Diagnosis Date  . Barrett's esophagus   . Chronic back pain   . Gastritis    Mild  . Gastroparesis   . GERD (gastroesophageal reflux disease)   . Headache   . Hyperglycemia   . Hypertension   . Nephrolithiasis   . Renal cyst   . Stricture and stenosis of esophagus   . Stroke ([place]) 07-2015  . Vision abnormalities     Past Surgical History:  Procedure Laterality Date  . [place] SURGERY  2012  . COLONOSCOPY    . [place] SURGERY  2005, 2010   replaced L4 and L5  . UPPER GASTROINTESTINAL ENDOSCOPY      There were no vitals filed for this visit.   Subjective Assessment - 05/20/20 1319    Currently in Pain? Yes    Pain Score 3     Pain Location Back    Pain Descriptors / Indicators Aching    Pain Type Chronic pain    Pain Onset More than a month ago    Pain Frequency Constant    Aggravating Factors  movement    Pain Relieving Factors rest                    Treatment:closed chain shoulder flexion with modified techniques, pt attempted chest press, however this was discontinued due to pain. Seated closed chain chest press, shoulder abduction and shoulder flex within pain free ROM, min v.c. for positioning. Ambulating  while scanning for playing cards, pt bumped 2 items on left and pt missed 2/13 items that were on right side during environmental scanning, min v.c. Grooved pegboard for increased LUE coordination, min difficulty/ v.c, removing pegs with in hand manipulation, min v.c              OT Short Term Goals - 05/08/20 1556      OT SHORT TERM GOAL #1   Title Pt will be mod I with HEP for coordination for LUE, balance    Time 6    Period Weeks    Status Achieved      OT SHORT TERM GOAL #2   Title Pt will consistently don socks and tie shoes    Time 6    Period Weeks    Status Achieved      OT SHORT TERM GOAL #3   Title Pt will demonstrate improved coordination as evidenced by decreasing time on 9 hole peg with LUE by at least 10 seconds to assist with functional fine motor tasks.    Baseline 69.06 sec    Time 6    Period Weeks  Status Achieved   51.43     OT SHORT TERM GOAL #4   Title Pt will report overall less drops Lt hand during the day attending to Lt side consistently    Time 6    Period Weeks    Status Achieved      OT SHORT TERM GOAL #5   Title Pt will perform dynamic standing tasks w/ supervision without LOB    Time 6    Period Weeks    Status Achieved             OT Long Term Goals - 05/08/20 1556      OT LONG TERM GOAL #1   Title Pt will be independent with updated HEP including aquatic HEP prn    Time 12    Period Weeks    Status On-going      OT LONG TERM GOAL #2   Title Pt will be mod I with shower transfers    Time 12    Period Weeks    Status On-going   awaiting for grab bars to be installed     OT LONG TERM GOAL #3   Title Pt will be mod I with hot meal prep with at least two items.    Time 12    Period Weeks    Status On-going   Pt currently cooking simple one meal dishes (oatmeal, egg)     OT LONG TERM GOAL #4   Title Pt demonstrate improved coordination as evidenced by decreasing time on 9 hole peg by at least 20 seconds with LUE.      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    Time 12    Period Weeks    Status Partially Met   Pt currently going with wife and paying     OT LONG TERM GOAL #6   Title Pt will perform environmental scanning during ambulation with simple physical task at 90% or greater accuracy    Time 12    Period Weeks    Status New                  Patient will benefit from skilled therapeutic intervention in order to improve the following deficits and impairments:           Visit Diagnosis: Hemiplegia and hemiparesis following cerebral infarction affecting left non-dominant side (HCC)  Visuospatial deficit  Other abnormalities of gait and mobility  Other lack of coordination  Chronic left shoulder pain    Problem List Patient Active Problem List   Diagnosis Date Noted  . Depressive reaction   . Sleep disturbance   . Urinary retention   . Bradycardia   . Essential hypertension   . Temperature elevated   . Left thyroid nodule   . Treatment-emergent central sleep apnea 10/25/2018  . 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 Chronic pain syndrome   . Chronic bilateral low back pain   . Benign essential HTN   . Tobacco abuse   . Marijuana abuse   . Hyperlipidemia   . History of CVA with residual deficit   . Dysphagia, post-stroke   . ICH (intracerebral hemorrhage) (HCC) - R thalmic/PLIC d/t HTN 26/94/8546  .  Insomnia 07/13/2017  . PAD (peripheral artery disease) ([place]) 07/13/2017  . Hyperlipidemia with target LDL less than 70 07/13/2017  . Stroke-like symptom 09/22/2016  . Paresthesia 09/22/2016  . CVA (cerebral vascular accident) ([place]) 09/22/2016  . Cerebrovascular accident (CVA) due to thrombosis of left carotid artery ([place]) 09/23/2015  . Primary snoring  09/23/2015  . Hypersomnia with sleep apnea 09/23/2015  . Obstructive sleep apnea 09/23/2015  . Lacunar infarct, acute ([place]) 08/25/2015  . Sleep apnea 08/25/2015  . Dysarthria   . CVD (cardiovascular disease) 08/02/2015  . Acute hyperglycemia 08/02/2015  . Systolic hypertension with cerebrovascular disease 12/27/2014  . Epistaxis 07/28/2012  . HYPERGLYCEMIA 10/02/2010  . NEPHROLITHIASIS 05/23/2009  . BARRETTS ESOPHAGUS 02/20/2009  . Gastroparesis 02/20/2009  . RADICULOPATHY 10/21/2008  . GERD 10/08/2008  . ESOPHAGITIS 08/15/2008  . ESOPHAGEAL STRICTURE 08/15/2008  . GASTRITIS, ACUTE 08/15/2008  . RENAL CYST 08/14/2008  . TOBACCO ABUSE 08/08/2008  . HEMATURIA, MICROSCOPIC, HX OF 08/08/2008  . PULMONARY NODULE 01/10/2008    Riti Rollyson 05/21/2020, 12:40 PM  [facility] [address] [place], [place], [address] Phone: [phone]   Fax:  [phone]  Name: AYUUB PENLEY MRN: [id number] Date of Birth: July 03, 1951

## 2020-05-22 ENCOUNTER — Encounter: Payer: Self-pay | Admitting: Occupational Therapy

## 2020-05-22 ENCOUNTER — Ambulatory Visit: Payer: Medicare Other | Admitting: Occupational Therapy

## 2020-05-22 ENCOUNTER — Ambulatory Visit: Payer: Medicare Other

## 2020-05-22 ENCOUNTER — Other Ambulatory Visit: Payer: Self-pay

## 2020-05-22 DIAGNOSIS — R29818 Other symptoms and signs involving the nervous system: Secondary | ICD-10-CM

## 2020-05-22 DIAGNOSIS — R471 Dysarthria and anarthria: Secondary | ICD-10-CM

## 2020-05-22 DIAGNOSIS — R278 Other lack of coordination: Secondary | ICD-10-CM

## 2020-05-22 DIAGNOSIS — M6281 Muscle weakness (generalized): Secondary | ICD-10-CM

## 2020-05-22 DIAGNOSIS — M25512 Pain in left shoulder: Secondary | ICD-10-CM

## 2020-05-22 DIAGNOSIS — R270 Ataxia, unspecified: Secondary | ICD-10-CM

## 2020-05-22 DIAGNOSIS — R293 Abnormal posture: Secondary | ICD-10-CM

## 2020-05-22 DIAGNOSIS — R2681 Unsteadiness on feet: Secondary | ICD-10-CM | POA: Diagnosis not present

## 2020-05-22 DIAGNOSIS — G8929 Other chronic pain: Secondary | ICD-10-CM

## 2020-05-22 DIAGNOSIS — R2689 Other abnormalities of gait and mobility: Secondary | ICD-10-CM

## 2020-05-22 DIAGNOSIS — R41841 Cognitive communication deficit: Secondary | ICD-10-CM | POA: Diagnosis not present

## 2020-05-22 DIAGNOSIS — I69354 Hemiplegia and hemiparesis following cerebral infarction affecting left non-dominant side: Secondary | ICD-10-CM | POA: Diagnosis not present

## 2020-05-22 DIAGNOSIS — R41842 Visuospatial deficit: Secondary | ICD-10-CM

## 2020-05-22 DIAGNOSIS — R1310 Dysphagia, unspecified: Secondary | ICD-10-CM | POA: Diagnosis not present

## 2020-05-22 NOTE — Therapy (Signed)
[facility] [address] [place], [place], [address] Phone: [phone]   Fax:  [phone]  Occupational Therapy Treatment  Patient Details  Name: Richard Vaughn MRN: [id number] Date of Birth: 05/12/1951 Referring Provider (OT): Reesa Chew, [place]   Encounter Date: 05/22/2020   OT End of Session - 05/22/20 0919    Visit Number 15    Number of Visits 25    Date for OT Re-Evaluation 06/26/20    Authorization Type MCR, BS/BS    Authorization - Visit Number 15    Authorization - Number of Visits 20    OT Start Time 4827   arrived late   OT Stop Time 0934    OT Time Calculation (min) 36 min    Activity Tolerance Patient tolerated treatment well    Behavior During Therapy [facility] for tasks assessed/performed           Past Medical History:  Diagnosis Date  . Barrett's esophagus   . Chronic back pain   . Gastritis    Mild  . Gastroparesis   . GERD (gastroesophageal reflux disease)   . Headache   . Hyperglycemia   . Hypertension   . Nephrolithiasis   . Renal cyst   . Stricture and stenosis of esophagus   . Stroke ([place]) 07-2015  . Vision abnormalities     Past Surgical History:  Procedure Laterality Date  . [place] SURGERY  2012  . COLONOSCOPY    . [place] SURGERY  2005, 2010   replaced L4 and L5  . UPPER GASTROINTESTINAL ENDOSCOPY      There were no vitals filed for this visit.   Subjective Assessment - 05/22/20 0859    Pertinent History Rt thalamic hemmorhage w/ Lt hemiparesis 02/22/20. PMH: Stroke 2019 w/ Lt hemiparesis, HLD, HTN, GERD, Barretts esophagus    Limitations fall risk, no driving, Lt shoulder rotator cuff tear (see MRI results 03/14/20)    Currently in Pain? Yes    Pain Score 4     Pain Location Back    Pain Orientation Lower    Pain Descriptors / Indicators Aching    Pain Type Chronic pain    Pain Onset More than a month ago    Pain Frequency Constant    Aggravating Factors   too much activity    Pain Relieving Factors unknown            AAROM (low range) for shoulder flex and abduction with walking pole on floor.  Then standing, low-range shoulder table slides for shoulder flexion and abduction as able with pain.  Standing, low-range functional reaching to place various sized pegs in arc pegboard with min difficulty/drops.  Sitting, placing small pegs in pegboard to copy design with incr time and min difficulty/cues with coordination/copying.  Flipping cards with LUE and dealing cards with thumb with min difficulty for incr coordination.   Completing 12-piece puzzle with mod incr time and min cueing to complete.     OT Short Term Goals - 05/08/20 1556      OT SHORT TERM GOAL #1   Title Pt will be mod I with HEP for coordination for LUE, balance    Time 6    Period Weeks    Status Achieved      OT SHORT TERM GOAL #2   Title Pt will consistently don socks and tie shoes    Time 6    Period Weeks    Status Achieved  OT SHORT TERM GOAL #3   Title Pt will demonstrate improved coordination as evidenced by decreasing time on 9 hole peg with LUE by at least 10 seconds to assist with functional fine motor tasks.    Baseline 69.06 sec    Time 6    Period Weeks    Status Achieved   51.43     OT SHORT TERM GOAL #4   Title Pt will report overall less drops Lt hand during the day attending to Lt side consistently    Time 6    Period Weeks    Status Achieved      OT SHORT TERM GOAL #5   Title Pt will perform dynamic standing tasks w/ supervision without LOB    Time 6    Period Weeks    Status Achieved             OT Long Term Goals - 05/08/20 1556      OT LONG TERM GOAL #1   Title Pt will be independent with updated HEP including aquatic HEP prn    Time 12    Period Weeks    Status On-going      OT LONG TERM GOAL #2   Title Pt will be mod I with shower transfers    Time 12    Period Weeks    Status On-going   awaiting for grab  bars to be installed     OT LONG TERM GOAL #3   Title Pt will be mod I with hot meal prep with at least two items.    Time 12    Period Weeks    Status On-going   Pt currently cooking simple one meal dishes (oatmeal, egg)     OT LONG TERM GOAL #4   Title Pt demonstrate improved coordination as evidenced by decreasing time on 9 hole peg by at least 20 seconds with LUE.    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    OT Occupational Profile and History Detailed Assessment- Review of Records and additional review of physical, cognitive, psychosocial history related to current functional performance    Occupational performance deficits (Please refer to evaluation for details): ADL's;IADL's;Leisure    Body Structure / Function / Physical Skills ADL;ROM;IADL;Balance;Body mechanics;Mobility;Strength;Coordination;FMC;Pain;UE functional use;Proprioception;Decreased knowledge of use of DME    Cognitive Skills Attention    Rehab Potential Good    Clinical Decision Making Several treatment options, min-mod task modification necessary    Comorbidities Affecting Occupational Performance: Presence of comorbidities impacting occupational performance    Comorbidities impacting occupational performance  description: Lt rotator cuff tear    Modification or  Assistance to Complete Evaluation  Min-Moderate modification of tasks or assist with assess necessary to complete eval    OT Frequency 2x / week    OT Duration 12 weeks    OT Treatment/Interventions Self-care/ADL training;Therapeutic exercise;Functional Mobility Training;Aquatic Therapy;Neuromuscular education;Manual Therapy;Splinting;Energy conservation;Therapeutic activities;DME and/or AE instruction;Cognitive remediation/compensation;Visual/perceptual remediation/compensation;Passive range of motion;Patient/family education;Moist Heat    Plan NMR to address balance, postural alignment and control, LUE ROM within pain tolerance, coordination, and dynamic standing balance    Consulted and Agree with Plan of Care Patient           Patient will benefit from skilled therapeutic intervention in order to improve the following deficits and impairments:   Body Structure / Function / Physical Skills: ADL, ROM, IADL, Balance, Body mechanics, Mobility, Strength, Coordination, FMC, Pain, UE functional use, Proprioception, Decreased knowledge of use of DME Cognitive Skills: Attention     Visit Diagnosis: Other lack of coordination  Hemiplegia and hemiparesis following cerebral infarction affecting left non-dominant side (HCC)  Visuospatial deficit  Other abnormalities of gait and mobility  Muscle weakness (generalized)  Unsteadiness on feet  Chronic left shoulder pain  Abnormal posture  Ataxia  Other symptoms and signs involving the nervous system    Problem List Patient Active Problem List   Diagnosis Date Noted  . Depressive reaction   . Sleep disturbance   . Urinary retention   . Bradycardia   . Essential hypertension   . Temperature elevated   . Left thyroid nodule   . Treatment-emergent central sleep apnea 10/25/2018  . 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 Chronic pain syndrome   . Chronic bilateral low back pain   . Benign  essential HTN   . Tobacco abuse   . Marijuana abuse   . Hyperlipidemia   . History of CVA with residual deficit   . Dysphagia, post-stroke   . ICH (intracerebral hemorrhage) (HCC) - R thalmic/PLIC d/t HTN 53/61/4431  . Insomnia 07/13/2017  . PAD (peripheral artery disease) ([place]) 07/13/2017  . Hyperlipidemia with target LDL less than 70 07/13/2017  . Stroke-like symptom 09/22/2016  . Paresthesia 09/22/2016  . CVA (cerebral vascular accident) ([place]) 09/22/2016  . Cerebrovascular accident (CVA) due to thrombosis of left carotid artery ([place]) 09/23/2015  . Primary snoring 09/23/2015  . Hypersomnia with sleep apnea 09/23/2015  . Obstructive sleep apnea 09/23/2015  . Lacunar infarct, acute ([place]) 08/25/2015  . Sleep apnea 08/25/2015  . Dysarthria   . CVD (cardiovascular disease) 08/02/2015  . Acute hyperglycemia 08/02/2015  . Systolic hypertension with cerebrovascular disease 12/27/2014  . Epistaxis 07/28/2012  . HYPERGLYCEMIA 10/02/2010  . NEPHROLITHIASIS 05/23/2009  . BARRETTS ESOPHAGUS 02/20/2009  . Gastroparesis 02/20/2009  . RADICULOPATHY 10/21/2008  . GERD 10/08/2008  . ESOPHAGITIS 08/15/2008  . ESOPHAGEAL STRICTURE 08/15/2008  . GASTRITIS, ACUTE 08/15/2008  . RENAL CYST 08/14/2008  . TOBACCO ABUSE 08/08/2008  . HEMATURIA, MICROSCOPIC, HX OF 08/08/2008  . PULMONARY NODULE 01/10/2008    [facility] 05/22/2020, 3:40 PM  [facility] [address] [place], [place], [address] Phone: [phone]   Fax:  [phone]  Name: Richard Vaughn MRN: [id number] Date of Birth: [birth year]   Vianne Bulls, OTR/L [facility] [address] [place], [place]  [address] [phone] phone [phone] 05/22/20 3:40 PM

## 2020-05-22 NOTE — Patient Instructions (Signed)
     TRY WEARING A WATCH OR A RING, NECKLACE YOU HAVEN'T WORN IN A WHILE, TO BE YOUR CUE TO TALK SLOWER AND TO OVER-ENUNCIATE.

## 2020-05-22 NOTE — Therapy (Signed)
[facility] [address] [place], [place], [address] Phone: [phone]   Fax:  [phone]  Physical Therapy Treatment  Patient Details  Name: Richard Vaughn MRN: [id number] Date of Birth: 14-Jan-1951 Referring Provider (PT): Reesa Chew PA-C   Encounter Date: 05/22/2020   PT End of Session - 05/22/20 1023    Visit Number 18    Number of Visits 22    Date for PT Re-Evaluation 06/16/20   POC for 6 weeks, Cert for 90 days   Authorization Type Medicare + [employer] (10th visit PN)    Progress Note Due on Visit 10    PT Start Time 1019   finishing up with speech therapy   PT Stop Time 1100    PT Time Calculation (min) 41 min    Equipment Utilized During Treatment Gait belt    Activity Tolerance Patient tolerated treatment well    Behavior During Therapy WFL for tasks assessed/performed           Past Medical History:  Diagnosis Date  . Barrett's esophagus   . Chronic back pain   . Gastritis    Mild  . Gastroparesis   . GERD (gastroesophageal reflux disease)   . Headache   . Hyperglycemia   . Hypertension   . Nephrolithiasis   . Renal cyst   . Stricture and stenosis of esophagus   . Stroke ([place]) 07-2015  . Vision abnormalities     Past Surgical History:  Procedure Laterality Date  . [place] SURGERY  2012  . COLONOSCOPY    . [place] SURGERY  2005, 2010   replaced L4 and L5  . UPPER GASTROINTESTINAL ENDOSCOPY      There were no vitals filed for this visit.   Subjective Assessment - 05/22/20 1022    Subjective No new complaints/concerns since last visit. No falls.    Patient is accompained by: Family member   Wife   Pertinent History Chronic Back Pain, Barrett's Esophagus, GERD, Hyperglycemia, HTN, CVA, Hyperlipidemia, vision abnormalities, ICH in the right thalamus/posterior limb of internal capsule    Limitations Standing;Walking;House hold activities    Patient Stated Goals "want to walk", "get  back to how I was"    Currently in Pain? Yes    Pain Score 3     Pain Location Back    Pain Orientation Lower    Pain Descriptors / Indicators Aching    Pain Type Chronic pain    Pain Onset More than a month ago                             [facility] Adult PT Treatment/Exercise - 05/22/20 0001      Transfers   Transfers Sit to Stand;Stand to Sit    Sit to Stand 5: Supervision    Stand to Sit 5: Supervision      Ambulation/Gait   Ambulation/Gait Yes    Ambulation/Gait Assistance 5: Supervision;4: Min guard    Ambulation/Gait Assistance Details completed extensive gait training without AD in today's session, completed x 500 ft without AD. Patient able to complete proper heel toe pattern when primarily concentrating on gait. Due to patient increased difficulty with toe clearnace especially with dual tasking. PT applied XL foot up braces to LLE to further promote improved toe clearance and safety. Patient demonstrate improved toe clearance with ambulation with dual tasking. PT verbalize differences, with wife agreeing. However patient reports he  is unable to determine difference with foot up brace applied. Ambulated with foot brace x 400 ft, and followed without foot up brace x 250 ft to furthre determine if patient notices a difference.     Ambulation Distance (Feet) 1150 Feet    Assistive device None    Gait Pattern Step-through pattern;Decreased hip/knee flexion - left    Ambulation Surface Level;Indoor    Gait Comments PT providing education and printout on purchase options for two up brace if patient/wife determines they would like to purchase.      Self-Care   Self-Care Other Self-Care Comments    Other Self-Care Comments  Due to patient reports of altered mood PT provided education on recieving consult with neuropsychologist for further evaluation due to depressive mood. PT providing contact information and to start process of scheduling an appointment. If there is an  extended wait for appt with neuropsychologist, PT educating that she will reach out to PCP to be further assesed. Patient and wife verbalizing agreement.                   PT Education - 05/22/20 1157    Education Details Educated on contact information for Neuropsychologist and scheduling appointment; Also educated on purchase options for foot up brace    Person(s) Educated Patient;Spouse    Methods Demonstration;Explanation;Handout    Comprehension Verbalized understanding;Returned demonstration            PT Short Term Goals - 05/08/20 1621      PT SHORT TERM GOAL #1   Title Pt will ambulate >600' on level surfaces mod I for improved mobility.    Baseline Ambulated 690 Mod I with Rollator    Time 3    Period Weeks    Status Achieved    Target Date 05/08/20      PT SHORT TERM GOAL #2   Title Pt will decrease 5x sit to stand from 24 sec to <22 sec for improved balance and functional strength.    Baseline 21.25 secs w/o UE support from mat    Time 3    Period Weeks    Status Achieved    Target Date 05/08/20             PT Long Term Goals - 04/17/20 1352      PT LONG TERM GOAL #1   Title Patient will be independent with final HEP    Baseline PT continues to add to HEP    Time 6    Period Weeks    Status On-going    Target Date 05/29/20      PT LONG TERM GOAL #2   Title Pt will increase Berg from 44/56 to 47/56 or more for improved balance and decreased fall risk.    Baseline 36/56, 04/17/20 44/56    Time 6    Period Weeks    Status New    Target Date 05/29/20      PT LONG TERM GOAL #3   Title Pt will improve gait speed to >/=2.0 ft/sec. with LRAD to safely amb. in the community.    Baseline 1.36 ft    Time 6    Period Weeks    Status On-going    Target Date 05/29/20      PT LONG TERM GOAL #4   Title Pt will ambulate >800' on varies surfaces supervision for improved community mobility.    Time 6    Period Weeks    Status Revised  Target Date  05/29/20                 Plan - 05/22/20 1159    Clinical Impression Statement PT educating on scheduling appointment with neuropsychologist to allow for further evaluation, with patient and wife verbalizing agreement. Today's session focused on gait training without AD. Completed some gait trianing with foot up brace applied to help improve toe clearance. Patient will continue to benefit from skilled PT services to further progress toward goals.    Personal Factors and Comorbidities Comorbidity 3+    Comorbidities Chronic Back Pain, Barrett's Esophagus, GERD, Hyperglycemia, HTN, CVA, Hyperlipidemia, vision abnormalities, ICH in the right thalamus/posterior limb of internal capsule    Examination-Activity Limitations Bed Mobility;Transfers;Stairs;Stand    Examination-Participation Restrictions Yard Work;Community Activity    Stability/Clinical Decision Making Evolving/Moderate complexity    Rehab Potential Good    PT Frequency 2x / week    PT Duration 6 weeks    PT Treatment/Interventions ADLs/Self Care Home Management;Electrical Stimulation;Moist Heat;Cryotherapy;Gait training;Stair training;Functional mobility training;Therapeutic activities;DME Instruction;Therapeutic exercise;Balance training;Neuromuscular re-education;Patient/family education;Orthotic Fit/Training;Passive range of motion;Visual/perceptual remediation/compensation    PT Next Visit Plan Continue gait w/o AD, dynamic gait as tolerated. Focus on improving left foot clearance. Strengthening exercises adding in more left hip extension, Standing Balance.    Consulted and Agree with Plan of Care Patient;Family member/caregiver    Family Member Consulted Wife           Patient will benefit from skilled therapeutic intervention in order to improve the following deficits and impairments:  Abnormal gait, Decreased balance, Decreased endurance, Decreased mobility, Difficulty walking, Impaired vision/preception, Decreased  knowledge of precautions, Decreased strength, Decreased safety awareness, Decreased knowledge of use of DME, Decreased activity tolerance  Visit Diagnosis: Unsteadiness on feet  Muscle weakness (generalized)  Other lack of coordination  Other abnormalities of gait and mobility  Hemiplegia and hemiparesis following cerebral infarction affecting left non-dominant side [facility])     Problem List Patient Active Problem List   Diagnosis Date Noted  . Depressive reaction   . Sleep disturbance   . Urinary retention   . Bradycardia   . Essential hypertension   . Temperature elevated   . Left thyroid nodule   . Treatment-emergent central sleep apnea 10/25/2018  . 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 Chronic pain syndrome   . Chronic bilateral low back pain   . Benign essential HTN   . Tobacco abuse   . Marijuana abuse   . Hyperlipidemia   . History of CVA with residual deficit   . Dysphagia, post-stroke   . ICH (intracerebral hemorrhage) (HCC) - R thalmic/PLIC d/t HTN 51/88/4166  . Insomnia 07/13/2017  . PAD (peripheral artery disease) ([place]) 07/13/2017  . Hyperlipidemia with target LDL less than 70 07/13/2017  . Stroke-like symptom 09/22/2016  . Paresthesia 09/22/2016  . CVA (cerebral vascular accident) ([place]) 09/22/2016  . Cerebrovascular accident (CVA) due to thrombosis of left carotid artery ([place]) 09/23/2015  . Primary snoring 09/23/2015  . Hypersomnia with sleep apnea 09/23/2015  . Obstructive sleep apnea 09/23/2015  . Lacunar infarct, acute ([place]) 08/25/2015  . Sleep apnea 08/25/2015  . Dysarthria   . CVD (cardiovascular disease) 08/02/2015  . Acute hyperglycemia 08/02/2015  . Systolic hypertension with cerebrovascular disease 12/27/2014  . Epistaxis 07/28/2012  . HYPERGLYCEMIA 10/02/2010  . NEPHROLITHIASIS 05/23/2009  . BARRETTS ESOPHAGUS 02/20/2009  . Gastroparesis 02/20/2009  . RADICULOPATHY 10/21/2008  . GERD 10/08/2008  .  ESOPHAGITIS 08/15/2008  .  ESOPHAGEAL STRICTURE 08/15/2008  . GASTRITIS, ACUTE 08/15/2008  . RENAL CYST 08/14/2008  . TOBACCO ABUSE 08/08/2008  . HEMATURIA, MICROSCOPIC, HX OF 08/08/2008  . PULMONARY NODULE 01/10/2008    Jones Bales, PT, DPT 05/22/2020, 12:04 PM  [facility] [address] [place], [place], [address] Phone: [phone]   Fax:  [phone]  Name: Richard Vaughn MRN: [id number] Date of Birth: 1950/12/29

## 2020-05-22 NOTE — Therapy (Signed)
[facility] [address] [place], [place], [address] Phone: [phone]   Fax:  [phone]  Speech Language Pathology Treatment  Patient Details  Name: Richard Vaughn MRN: [id number] Date of Birth: November 22, 1950 Referring Provider (SLP): Reesa Chew (referral), [place], Betty, MD (documentation)   Encounter Date: 05/22/2020   End of Session - 05/22/20 1142    Visit Number 14    Number of Visits 17    Date for SLP Re-Evaluation 06/10/20    SLP Start Time 0934    SLP Stop Time  1015    SLP Time Calculation (min) 41 min    Activity Tolerance Patient tolerated treatment well           Past Medical History:  Diagnosis Date  . Barrett's esophagus   . Chronic back pain   . Gastritis    Mild  . Gastroparesis   . GERD (gastroesophageal reflux disease)   . Headache   . Hyperglycemia   . Hypertension   . Nephrolithiasis   . Renal cyst   . Stricture and stenosis of esophagus   . Stroke ([place]) 07-2015  . Vision abnormalities     Past Surgical History:  Procedure Laterality Date  . [place] SURGERY  2012  . COLONOSCOPY    . [place] SURGERY  2005, 2010   replaced L4 and L5  . UPPER GASTROINTESTINAL ENDOSCOPY      There were no vitals filed for this visit.   Subjective Assessment - 05/22/20 0947    Subjective Pt states meds have been taken with the alarm last 2 days.    Currently in Pain? Yes    Pain Location Back    Pain Orientation Lower    Pain Descriptors / Indicators Aching    Pain Type Chronic pain    Pain Onset More than a month ago    Pain Frequency Constant                 ADULT SLP TREATMENT - 05/22/20 0948      General Information   Behavior/Cognition Alert;Cooperative;Pleasant mood      Treatment Provided   Treatment provided Cognitive-Linquistic      Cognitive-Linquistic Treatment   Treatment focused on Cognition;Dysarthria    Skilled Treatment SLP targeted pt's speech  intelligibility as pt greeted SLP with unintelligible greeting. Pt routinely able to improve the intelligibility of his utterance with a nonverbal cue but pt agreed it would be better to overarticulate initially and not repeat his utterance. SLP engaged pt with sentence respones in order to achieve habitual overarticulation at a lower linguisitc level - pt success 95% in setence completion, and 90% with 3-4 word spontaneous responses. Initially pt responses between stimuli req'd SLP cues for clarity but as task progressed pt used overarticulation more spontaneously. Pt req'd cues from SLP for what he could do in order to incr frequency of overarticulation use - pt will wear a watch or a ring or a necklace and this will be a tactile cue to overarticulate. Pt agrees with SLP that there is still more progress to me made with speech and cognition so SLP will recert pt for four more weeks.       Assessment / Recommendations / Plan   Plan Continue with current plan of care      Progression Toward Goals   Progression toward goals Progressing toward goals            SLP Education - 05/22/20  [place]    Education Details external cues for overarticuation    Person(s) Educated Patient;Spouse    Methods Explanation    Comprehension Verbalized understanding            SLP Short Term Goals - 05/15/20 1438      SLP SHORT TERM GOAL #1   Title pt will improve emergent awareness with simple functional tasks to requiring min A rarely over three sessions    Period --   or 9 initital sessions for all STGs   Status Not Met      SLP SHORT TERM GOAL #2   Title pt will demo compensations for dysarthria in simple sentence responses generating at lest 90% intelligibility over 3 sessions    Baseline 04/17/20    Status Partially Met      SLP SHORT TERM GOAL #3   Title pt will demonstrate sustained attention for 15 minutes in mod complex tasks in 3 sessions    Status Not Met      SLP SHORT TERM GOAL #4   Title  Pt will complete HEP for unintelligible speech with occaisonal mod A for articulation and loudnes    Baseline 04/17/20    Status Achieved            SLP Long Term Goals - 05/22/20 1142      SLP LONG TERM GOAL #1   Title pt will demo selective attention to complete simple functional tasks in min noisy environment for 10 minutes    Baseline 05-22-20    Time 2    Period Weeks   or 17 total sessions for all LTGs   Status On-going      SLP LONG TERM GOAL #2   Title pt will demo emergent awareness with mod complex tasks by double checkng work 100% of the time over three sessions    Time 2    Period Weeks    Status Revised      SLP LONG TERM GOAL #3   Title pt will demo speech volume in 8 minutes simple complex conversation, in low 70s dB average over three consecutive sessions    Time 2    Period Weeks    Status On-going      SLP LONG TERM GOAL #4   Title pt will produce 10 minutes intelligible simple-mod complex conversation using compensations, with rare min A over 3 consecutive sessions    Time 2    Period Weeks    Status On-going      SLP LONG TERM GOAL #5   Title Pt will complete HEP for unintelligible speech with occaisonal min A for articulation and loudness    Time 2    Period Weeks    Status On-going            Plan - 05/22/20 1142    Clinical Impression Statement Pt with premorbid dysarthria- mild. Today moderate-severe at times. He also cont to present today with mild-mod cognitive communication deficits characterized by decr'd attention and awareness. Pt would benefit from skilled ST targeting cognitive linguistics as well as dysarthria.    Speech Therapy Frequency 2x / week    Duration --   8 weeks or 17 total sessions   Treatment/Interventions Oral motor exercises;Compensatory techniques;Functional tasks;SLP instruction and feedback;Patient/family education;Internal/external aids;Cueing hierarchy;Cognitive reorganization    Potential to Achieve Goals Good     Potential Considerations Severity of impairments           Patient will benefit from skilled  therapeutic intervention in order to improve the following deficits and impairments:   Dysarthria and anarthria  Cognitive communication deficit    Problem List Patient Active Problem List   Diagnosis Date Noted  . Depressive reaction   . Sleep disturbance   . Urinary retention   . Bradycardia   . Essential hypertension   . Temperature elevated   . Left thyroid nodule   . Treatment-emergent central sleep apnea 10/25/2018  . 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 Chronic pain syndrome   . Chronic bilateral low back pain   . Benign essential HTN   . Tobacco abuse   . Marijuana abuse   . Hyperlipidemia   . History of CVA with residual deficit   . Dysphagia, post-stroke   . ICH (intracerebral hemorrhage) (HCC) - R thalmic/PLIC d/t HTN 82/86/7519  . Insomnia 07/13/2017  . PAD (peripheral artery disease) ([place]) 07/13/2017  . Hyperlipidemia with target LDL less than 70 07/13/2017  . Stroke-like symptom 09/22/2016  . Paresthesia 09/22/2016  . CVA (cerebral vascular accident) ([place]) 09/22/2016  . Cerebrovascular accident (CVA) due to thrombosis of left carotid artery ([place]) 09/23/2015  . Primary snoring 09/23/2015  . Hypersomnia with sleep apnea 09/23/2015  . Obstructive sleep apnea 09/23/2015  . Lacunar infarct, acute ([place]) 08/25/2015  . Sleep apnea 08/25/2015  . Dysarthria   . CVD (cardiovascular disease) 08/02/2015  . Acute hyperglycemia 08/02/2015  . Systolic hypertension with cerebrovascular disease 12/27/2014  . Epistaxis 07/28/2012  . HYPERGLYCEMIA 10/02/2010  . NEPHROLITHIASIS 05/23/2009  . BARRETTS ESOPHAGUS 02/20/2009  . Gastroparesis 02/20/2009  . RADICULOPATHY 10/21/2008  . GERD 10/08/2008  . ESOPHAGITIS 08/15/2008  . ESOPHAGEAL STRICTURE 08/15/2008  . GASTRITIS, ACUTE 08/15/2008  . RENAL CYST 08/14/2008  . TOBACCO ABUSE  08/08/2008  . HEMATURIA, MICROSCOPIC, HX OF 08/08/2008  . PULMONARY NODULE 01/10/2008    [facility] ,[place], [place]  05/22/2020, 11:43 AM  [facility] [address] [place], [place], [address] Phone: [phone]   Fax:  [phone]   Name: Richard Vaughn MRN: [id number] Date of Birth: Jan 05, 1951

## 2020-05-26 ENCOUNTER — Encounter: Payer: Self-pay | Admitting: Adult Health

## 2020-05-26 ENCOUNTER — Ambulatory Visit (INDEPENDENT_AMBULATORY_CARE_PROVIDER_SITE_OTHER): Payer: Medicare Other | Admitting: Adult Health

## 2020-05-26 ENCOUNTER — Other Ambulatory Visit: Payer: Self-pay

## 2020-05-26 ENCOUNTER — Ambulatory Visit: Payer: Medicare Other | Admitting: Occupational Therapy

## 2020-05-26 VITALS — BP 112/73 | HR 60 | Ht 74.0 in | Wt 238.0 lb

## 2020-05-26 DIAGNOSIS — I10 Essential (primary) hypertension: Secondary | ICD-10-CM | POA: Diagnosis not present

## 2020-05-26 DIAGNOSIS — M6281 Muscle weakness (generalized): Secondary | ICD-10-CM | POA: Diagnosis not present

## 2020-05-26 DIAGNOSIS — E785 Hyperlipidemia, unspecified: Secondary | ICD-10-CM | POA: Diagnosis not present

## 2020-05-26 DIAGNOSIS — I619 Nontraumatic intracerebral hemorrhage, unspecified: Secondary | ICD-10-CM

## 2020-05-26 DIAGNOSIS — M25512 Pain in left shoulder: Secondary | ICD-10-CM

## 2020-05-26 DIAGNOSIS — I69354 Hemiplegia and hemiparesis following cerebral infarction affecting left non-dominant side: Secondary | ICD-10-CM | POA: Diagnosis not present

## 2020-05-26 DIAGNOSIS — R1310 Dysphagia, unspecified: Secondary | ICD-10-CM | POA: Diagnosis not present

## 2020-05-26 DIAGNOSIS — G4739 Other sleep apnea: Secondary | ICD-10-CM | POA: Diagnosis not present

## 2020-05-26 DIAGNOSIS — R2681 Unsteadiness on feet: Secondary | ICD-10-CM | POA: Diagnosis not present

## 2020-05-26 DIAGNOSIS — G8929 Other chronic pain: Secondary | ICD-10-CM

## 2020-05-26 DIAGNOSIS — R41841 Cognitive communication deficit: Secondary | ICD-10-CM | POA: Diagnosis not present

## 2020-05-26 DIAGNOSIS — R471 Dysarthria and anarthria: Secondary | ICD-10-CM | POA: Diagnosis not present

## 2020-05-26 NOTE — Patient Instructions (Signed)
Flexion (Assistive)    Hold Lt wrist with Rt hand (thumb side up) and raise arms above head, keeping elbows as straight as possible. Can be done sitting or lying. Repeat __10__ times. Do __2__ sessions per day.  Scapular Retraction (Prone)    Lie with arms at sides. Pinch shoulder blades together and shoulders arms a few inches from floor. "Place shoulder blades in back pockets". Do not let shoulders go towards ears only up toward ceiling Repeat __10__ times per set.  Do __2__ sessions per day.

## 2020-05-26 NOTE — Progress Notes (Signed)
[facility] [address] [place]. [place] [address] [phone]       STROKE FOLLOW UP NOTE  [name] Date of Birth:  06-27-1951 Medical Record Number:  [id number]   Reason for Referral: stroke follow up    SUBJECTIVE:   CHIEF COMPLAINT:  Chief Complaint  Patient presents with  . Cerebrovascular Accident    rm 9 here for a stroke f/u. Pt is having no new sx. Pt is here with his wife [name].  . surgical clearance    Requesting clearance for torn rotator cuff surgery    HPI:   Today, 05/26/2020, [name] returns accompanied by his wife for stroke follow-up.  Sooner visit requested to be cleared for torn rotator cuff surgery. Residual deficits of left-sided weakness, gait impairment with imbalance, dysarthria and cognitive impairment.  Continues to work with outpatient neuro rehab PT/OT/ST with ongoing improvement but limited rehab of LUE due to torn rotator cuff.  He did receive cortisone injection with some benefit.  Blood pressure today 112/73 - monitors at home which has been stable.  Order placed for CT head after prior visit but this has not been completed at this time.  He is currently being followed by [facility] orthopedics with Dr. Malena Catholic.  No further concerns at this time.    History provided for reference purposes only [name] is a 69 y.o. male with history of hypertension, hyperlipidemia, vision abnormalities, hyperglycemia, Barrett's esophagus,multiple lacunar infarcts, prior small hypertensive bleeds in bilateral deep gray matter structures with most recent ICH in the right thalamus/posterior limb of internal capsule in 2019 with mild residual left-sided weakness  who presented on 02/22/2020 with sudden onset of worsening dysarthria and left-sided weakness.   Stroke work-up revealed acute right thalamic hemorrhage secondary to history of HTN.  History of HTN stable during admission with long-term BP goal normotensive range.   LDL 73 with statin contraindicated with ICH.  No history of DM.  Other stroke risk factors include advanced age, PAD, tobacco use, history of substance abuse, obesity, prior history of stroke and family history of stroke.  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  Evaluated by therapies and recommended discharge to [name] for ongoing therapy needs and eventually discharged home on 03/07/2020.  Stroke: acute right thalamic hemorrhage secondary to hx HTN   Resultant dysarthria and left face and extremity weakness  Code Stroke CT Head - Positive for acute right thalamic hemorrhage (6 mL) with mild surrounding edema and minimal mass effect. No intraventricular or extra-axial extension at this time. Underlying advanced chronic small vessel disease.   CTA H&N - No large vessel occlusion. Minimal atherosclerosis in the head and neck - although there is at least moderate stenosis suspected at the origin of the non-dominant left vertebral artery origin.  MRI head W & WO - 2.1 x 2.1 x 2.2 cm acute intraparenchymal hemorrhage centered at the right thalamus, unchanged from previous. Associated mild surrounding vasogenic edema and localized regional mass effect, with up to 3 mm of localized right-to-left shift. No underlying mass lesion, abnormal enhancement, or other abnormality identified. Multiple remote lacunar infarcts involving the left basal ganglia and thalamus.   2D Echo - EF 60 - 65%. No cardiac source of emboli identified.   Sars Corona Virus 2 - negative  LDL - 73  HgbA1c - 5.4   VTE prophylaxis - SCDs  aspirin 81 mg daily prior to admission, now on No antithrombotic  Therapy recommendations:  [name]  Disposition: [name]   Today, 03/19/2020, [name] is being seen for hospital follow-up accompanied by his  brother and wife.  Residual deficits left-sided weakness, gait impairment, dysarthria and mild to moderate cognitive communication deficits.  Continues to participate in outpatient therapies at neuro rehab with ongoing improvement.  Use of rollator walker outside of home denies any recent falls.  Blood pressure today 122/78.  History of mixed sleep apnea.  Has self discontinued CPAP approx 6 months ago due to difficulty tolerating masks.  Trialed 2 different types of masks previously but has increased anxiety with mask use.  Recent diagnosis of rotator cuff tear and questions undergoing surgical procedure.  No further concerns at this time.       ROS:   14 system review of systems performed and negative with exception of weakness, gait impairment, speech impairment  PMH:  Past Medical History:  Diagnosis Date  . Barrett's esophagus   . Chronic back pain   . Gastritis    Mild  . Gastroparesis   . GERD (gastroesophageal reflux disease)   . Headache   . Hyperglycemia   . Hypertension   . Nephrolithiasis   . Renal cyst   . Stricture and stenosis of esophagus   . Stroke ([place]) 07-2015  . Vision abnormalities     PSH:  Past Surgical History:  Procedure Laterality Date  . [place] SURGERY  2012  . COLONOSCOPY    . [place] SURGERY  2005, 2010   replaced L4 and L5  . UPPER GASTROINTESTINAL ENDOSCOPY      Social History:  Social History   Socioeconomic History  . Marital status: Married    Spouse name: [name]  . Number of children: 3  . Years of education: Not on file  . Highest education level: Not on file  Occupational History  . Occupation: retired    Fish farm manager: [employer]  Tobacco Use  . Smoking status: Former Smoker    Packs/day: 0.50    Years: 30.00    Pack years: 15.00    Types: Cigarettes    Quit date: 04/15/2018    Years since quitting: 2.1  . Smokeless tobacco: Never Used  Substance and Sexual Activity  . Alcohol use: No    Alcohol/week: 0.0  standard drinks  . Drug use: Never    Comment: last use 04/2018  . Sexual activity: Yes  Other Topics Concern  . Not on file  Social History Narrative  . Not on file   Social Determinants of Health   Financial Resource Strain:   . Difficulty of Paying Living Expenses:   Food Insecurity:   . Worried About Charity fundraiser in the Last Year:   . Arboriculturist in the Last Year:   Transportation Needs:   . Film/video editor (Medical):   Marland Kitchen Lack of Transportation (Non-Medical):   Physical Activity:   . Days of Exercise per Week:   . Minutes of Exercise per Session:   Stress:   . Feeling of Stress :   Social Connections:   . Frequency of Communication with Friends and Family:   . Frequency of Social Gatherings with Friends and Family:   . Attends Religious Services:   . Active Member of Clubs or Organizations:   . Attends Archivist Meetings:   .  Marital Status:   Intimate Partner Violence:   . Fear of Current or Ex-Partner:   . Emotionally Abused:   Marland Kitchen Physically Abused:   . Sexually Abused:     Family History:  Family History  Problem Relation Age of Onset  . Hypertension Father   . Stroke Father   . Hypertension Mother   . Liver cancer Mother   . Hypertension Sister        2 other sisters has also  . Stroke Sister   . Stroke Sister   . Hypertension Brother        2nd brother has also  . Colon cancer Neg Hx   . Esophageal cancer Neg Hx   . Rectal cancer Neg Hx   . Stomach cancer Neg Hx   . Colon polyps Neg Hx     Medications:   Current Outpatient Medications on File Prior to Visit  Medication Sig Dispense Refill  . esomeprazole (NEXIUM) 40 MG capsule Take 1 capsule (40 mg total) by mouth 2 (two) times daily before a meal. Please keep your August appointment for further refills. 60 capsule 1  . melatonin 300 MCG TABS Take 5 tablets (1,500 mcg total) by mouth at bedtime.  0  . oxymorphone (OPANA) 10 MG tablet Take 10 mg by mouth in the morning  and at bedtime.     . senna-docusate (SENOKOT-S) 8.6-50 MG tablet Take 1 tablet by mouth 2 (two) times daily.    . traZODone (DESYREL) 50 MG tablet Take 1 tablet (50 mg total) by mouth at bedtime as needed for sleep. 90 tablet 1   No current facility-administered medications on file prior to visit.    Allergies:  No Known Allergies    OBJECTIVE:  Physical Exam  Vitals:   05/26/20 1009  BP: 112/73  Pulse: 60  Weight: 238 lb (108 kg)  Height: 6\' 2"  (1.88 m)   Body mass index is 30.56 kg/m. No exam data present  General: well developed, well nourished, pleasant middle-aged African-American male, seated, in no evident distress Head: head normocephalic and atraumatic.   Neck: supple with no carotid or supraclavicular bruits Cardiovascular: regular rate and rhythm, no murmurs Musculoskeletal: no deformity Skin:  no rash/petichiae Vascular:  Normal pulses all extremities   Neurologic Exam Mental Status: Awake and fully alert. moderate dysarthria. Oriented to place and time. Recent and remote memory intact. Attention span, concentration and fund of knowledge appropriate during visit. Mood and affect appropriate.  Cranial Nerves: Pupils equal, briskly reactive to light. Extraocular movements full without nystagmus. Visual fields full to confrontation. Hearing intact. Facial sensation intact. Face, tongue, palate moves normally and symmetrically.  Motor: Normal bulk and tone. Normal strength in all tested extremity muscles except mild decreased left hand dexterity.  Difficulty fully assessing LUE due to torn rotator cuff Sensory.: intact to touch , pinprick , position and vibratory sensation.  Coordination: Rapid alternating movements normal in all extremities except slightly decreased left hand. Finger-to-nose performed accurately RUE and heel-to-shin show mild LLE ataxia. Gait and Station: Arises from chair without difficulty. Stance is normal. Gait demonstrates  hemiplegic gait with  use of Rollator walker.  Tandem walk not attempted Reflexes: 1+ and symmetric. Toes downgoing.         ASSESSMENT: FARHAN JEAN is a 69 y.o. year old male with recent [place] history in right thalamus/posterior limb in 2019 with residual mild left-sided weakness who presented with sudden onset of worsening dysarthria and left-sided weakness on 02/22/2020 with  stroke work-up revealing acute right thalamic hemorrhage secondary to history of HTN. Vascular risk factors include multiple prior strokes including lacunar infarcts and small hypertensive bleeds, HTN, HLD, PAD, sleep apnea, history of substance abuse and family history of stroke.    Residual deficits of subjective left-sided weakness, moderate dysarthria, gait impairment and mild cognitive communication deficit     PLAN:  1. Nontraumatic thalamic hemorrhage (HCC) -Residual weakness, dysarthria, gait impairment and cognitive impairment -continue to work with outpatient therapy for ongoing improvement -Advised to call [facility] imaging to schedule CT head to follow-up on ICH -Depending on results, may consider restarting aspirin 81 mg daily for secondary stroke prevention as well as further clearance to proceed with rotator cuff surgery -Maintain strict control of hypertension with blood pressure goal below 130/90, diabetes with hemoglobin A1c goal below 6.5% and cholesterol with LDL cholesterol (bad cholesterol) goal below 70 mg/dL.  I also advised the patient to eat a healthy diet with plenty of whole grains, cereals, fruits and vegetables, exercise regularly with at least 30 minutes of continuous activity daily and maintain ideal body weight.  2. Essential hypertension -Stable.  Ongoing monitoring management by PCP  3. Hyperlipidemia LDL goal <70 -Prior level satisfactory.  Continue to follow with PCP for monitoring and management and if LDL becomes greater than 70, would recommend initiating statin for secondary stroke  prevention  4. Mixed sleep apnea -Difficulty tolerating CPAP.  He will follow up with sleep clinic as he had difficulty with CPAP masks.  Advised that if he does not receive a call by the end of this week, to call office for further assistance   5.  Surgical clearance -Currently 3 months post stroke not currently on antithrombotic -After CT head completed, if stable, will further discuss clearance with Dr. Leonie Man from a stroke standpoint especially as torn rotator cuff complicating further stroke recovery -Currently being followed by [facility] Dr. Malena Catholic   Follow up in 3 months or call earlier if needed   I spent 25 minutes of face-to-face and non-face-to-face time with patient and wife.  This included previsit chart review, lab review, study review, order entry, electronic health record documentation, patient education regarding surgical clearance, recent stroke, residual deficits, importance of managing stroke risk factors and answered all questions to patient and wife's satisfaction   Frann Rider, AGNP-BC  [facility] [address] [place], [place] [address]  Phone [phone] Fax [phone] Note: This document was prepared with digital dictation and possible smart phrase technology. Any transcriptional errors that result from this process are unintentional.

## 2020-05-26 NOTE — Patient Instructions (Addendum)
Please call [facility] to have repeat CT head completed - if stable apperance, will further speak with Dr. Leonie Man regarding clearance for procedure - your orthopedic doctor will send clearance form   Continue to work with outpatient therapies for ongoing stroke deficits   If you are not called by our sleep clinic by end of the week, please call office for further assistance   Continue to follow up with PCP regarding  Blood pressure management   Continue to monitor blood pressure at home  Maintain strict control of hypertension with blood pressure goal below 130/90, diabetes with hemoglobin A1c goal below 6.5% and cholesterol with LDL cholesterol (bad cholesterol) goal below 70 mg/dL. I also advised the patient to eat a healthy diet with plenty of whole grains, cereals, fruits and vegetables, exercise regularly and maintain ideal body weight.  Followup in the future with me in 3 months or call earlier if needed       Thank you for coming to see [place] at [facility]. I hope we have been able to provide you high quality care today.  You may receive a patient satisfaction survey over the next few weeks. We would appreciate your feedback and comments so that we may continue to improve ourselves and the health of our patients.

## 2020-05-26 NOTE — Progress Notes (Signed)
I agree with the above plan 

## 2020-05-26 NOTE — Therapy (Signed)
[facility] [address] [place], [place], [address] Phone: [phone]   Fax:  [phone]  Occupational Therapy Treatment  Patient Details  Name: Richard Vaughn MRN: [id number] Date of Birth: [birth year] Referring Provider (OT): Reesa Chew, [place]   Encounter Date: 05/26/2020   OT End of Session - 05/26/20 1032    Visit Number 16    Number of Visits 25    Date for OT Re-Evaluation 06/26/20    Authorization Type MCR, BS/BS    Authorization - Visit Number 86    Authorization - Number of Visits 20    Progress Note Due on Visit 20    OT Start Time 0850    OT Stop Time 0930    OT Time Calculation (min) 40 min    Activity Tolerance Patient tolerated treatment well    Behavior During Therapy [facility] for tasks assessed/performed           Past Medical History:  Diagnosis Date  . Barrett's esophagus   . Chronic back pain   . Gastritis    Mild  . Gastroparesis   . GERD (gastroesophageal reflux disease)   . Headache   . Hyperglycemia   . Hypertension   . Nephrolithiasis   . Renal cyst   . Stricture and stenosis of esophagus   . Stroke ([place]) 07-2015  . Vision abnormalities     Past Surgical History:  Procedure Laterality Date  . [place] SURGERY  2012  . COLONOSCOPY    . [place] SURGERY  2005, 2010   replaced L4 and L5  . UPPER GASTROINTESTINAL ENDOSCOPY      There were no vitals filed for this visit.   Subjective Assessment - 05/26/20 0912    Pertinent History Rt thalamic hemmorhage w/ Lt hemiparesis 02/22/20. PMH: Stroke 2019 w/ Lt hemiparesis, HLD, HTN, GERD, Barretts esophagus    Limitations fall risk, no driving, Lt shoulder rotator cuff tear (see MRI results 03/14/20)    Currently in Pain? Yes    Pain Score 4    up to 6/10   Pain Location Shoulder    Pain Orientation Left    Pain Descriptors / Indicators Sharp    Pain Type Chronic pain    Pain Onset More than a month ago    Pain Frequency  Intermittent    Aggravating Factors  midrange painful arc    Pain Relieving Factors proper positioning           Continued emphasis on maintaining shoulder ROM LUE w/ modifications prn to minimize pain but to prevent frozen shoulder. Worked on high level sh flexion in supine with modifications (elbows bent) and going to high range w/ pain 3/10. Also performed self ROM with assist from Rt hand for shoulder flexion supine and seated. Prone: worked on scapula retraction and posterior sh strengthening with tactile and verbal cues required for Lt scapula to prevent/minimize compensations as pt would try and hike shoulder and elevate scapula vs. Retraction and depression. Prone on elbows for trunk ext and scapula depression.  UBE x 5 min. Level 1 for shoulder ROM and strengthening                     OT Education - 05/26/20 0918    Education Details Additional shoulder HEP    Person(s) Educated Patient    Methods Explanation;Demonstration;Verbal cues;Handout    Comprehension Verbalized understanding;Returned demonstration;Verbal cues required  OT Short Term Goals - 05/08/20 1556      OT SHORT TERM GOAL #1   Title Pt will be mod I with HEP for coordination for LUE, balance    Time 6    Period Weeks    Status Achieved      OT SHORT TERM GOAL #2   Title Pt will consistently don socks and tie shoes    Time 6    Period Weeks    Status Achieved      OT SHORT TERM GOAL #3   Title Pt will demonstrate improved coordination as evidenced by decreasing time on 9 hole peg with LUE by at least 10 seconds to assist with functional fine motor tasks.    Baseline 69.06 sec    Time 6    Period Weeks    Status Achieved   51.43     OT SHORT TERM GOAL #4   Title Pt will report overall less drops Lt hand during the day attending to Lt side consistently    Time 6    Period Weeks    Status Achieved      OT SHORT TERM GOAL #5   Title Pt will perform dynamic standing tasks  w/ supervision without LOB    Time 6    Period Weeks    Status Achieved             OT Long Term Goals - 05/08/20 1556      OT LONG TERM GOAL #1   Title Pt will be independent with updated HEP including aquatic HEP prn    Time 12    Period Weeks    Status On-going      OT LONG TERM GOAL #2   Title Pt will be mod I with shower transfers    Time 12    Period Weeks    Status On-going   awaiting for grab bars to be installed     OT LONG TERM GOAL #3   Title Pt will be mod I with hot meal prep with at least two items.    Time 12    Period Weeks    Status On-going   Pt currently cooking simple one meal dishes (oatmeal, egg)     OT LONG TERM GOAL #4   Title Pt demonstrate improved coordination as evidenced by decreasing time on 9 hole peg by at least 20 seconds with LUE.    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    Occupational performance deficits (Please refer to evaluation for details): ADL's;IADL's;Leisure  Body Structure / Function / Physical Skills ADL;ROM;IADL;Balance;Body mechanics;Mobility;Strength;Coordination;FMC;Pain;UE functional use;Proprioception;Decreased knowledge of use of DME    Cognitive Skills Attention    Rehab Potential Good    Comorbidities impacting occupational performance description: Lt rotator cuff  tear    OT Frequency 2x / week    OT Duration 12 weeks    OT Treatment/Interventions Self-care/ADL training;Therapeutic exercise;Functional Mobility Training;Aquatic Therapy;Neuromuscular education;Manual Therapy;Splinting;Energy conservation;Therapeutic activities;DME and/or AE instruction;Cognitive remediation/compensation;Visual/perceptual remediation/compensation;Passive range of motion;Patient/family education;Moist Heat    Plan NMR to address balance, postural alignment and control, LUE ROM within pain tolerance, coordination, and dynamic standing balance    Consulted and Agree with Plan of Care Patient    Family Member Consulted wife           Patient will benefit from skilled therapeutic intervention in order to improve the following deficits and impairments:   Body Structure / Function / Physical Skills: ADL, ROM, IADL, Balance, Body mechanics, Mobility, Strength, Coordination, FMC, Pain, UE functional use, Proprioception, Decreased knowledge of use of DME Cognitive Skills: Attention     Visit Diagnosis: Hemiplegia and hemiparesis following cerebral infarction affecting left non-dominant side (HCC)  Chronic left shoulder pain    Problem List Patient Active Problem List   Diagnosis Date Noted  . Depressive reaction   . Sleep disturbance   . Urinary retention   . Bradycardia   . Essential hypertension   . Temperature elevated   . Left thyroid nodule   . Treatment-emergent central sleep apnea 10/25/2018  . 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 Chronic pain syndrome   . Chronic bilateral low back pain   . Benign essential HTN   . Tobacco abuse   . Marijuana abuse   . Hyperlipidemia   . History of CVA with residual deficit   . Dysphagia, post-stroke   . ICH (intracerebral hemorrhage) (HCC) - R thalmic/PLIC d/t HTN 85/27/7824  . Insomnia 07/13/2017  . PAD (peripheral artery disease) ([place]) 07/13/2017  . Hyperlipidemia with  target LDL less than 70 07/13/2017  . Stroke-like symptom 09/22/2016  . Paresthesia 09/22/2016  . CVA (cerebral vascular accident) ([place]) 09/22/2016  . Cerebrovascular accident (CVA) due to thrombosis of left carotid artery ([place]) 09/23/2015  . Primary snoring 09/23/2015  . Hypersomnia with sleep apnea 09/23/2015  . Obstructive sleep apnea 09/23/2015  . Lacunar infarct, acute ([place]) 08/25/2015  . Sleep apnea 08/25/2015  . Dysarthria   . CVD (cardiovascular disease) 08/02/2015  . Acute hyperglycemia 08/02/2015  . Systolic hypertension with cerebrovascular disease 12/27/2014  . Epistaxis 07/28/2012  . HYPERGLYCEMIA 10/02/2010  . NEPHROLITHIASIS 05/23/2009  . BARRETTS ESOPHAGUS 02/20/2009  . Gastroparesis 02/20/2009  . RADICULOPATHY 10/21/2008  . GERD 10/08/2008  . ESOPHAGITIS 08/15/2008  . ESOPHAGEAL STRICTURE 08/15/2008  . GASTRITIS, ACUTE 08/15/2008  . RENAL CYST 08/14/2008  . TOBACCO ABUSE 08/08/2008  . HEMATURIA, MICROSCOPIC, HX OF 08/08/2008  . PULMONARY NODULE 01/10/2008    Carey Bullocks, OTR/L 05/26/2020, 10:33 AM  [facility] [address] [place], [place], [address] Phone: [phone]   Fax:  [phone]  Name: ISHAQ MAFFEI MRN: [id number] Date of Birth: 21-Aug-1951

## 2020-05-27 ENCOUNTER — Ambulatory Visit: Payer: Medicare Other

## 2020-05-27 ENCOUNTER — Encounter: Payer: Self-pay | Admitting: Psychology

## 2020-05-27 DIAGNOSIS — R41841 Cognitive communication deficit: Secondary | ICD-10-CM | POA: Diagnosis not present

## 2020-05-27 DIAGNOSIS — R2681 Unsteadiness on feet: Secondary | ICD-10-CM

## 2020-05-27 DIAGNOSIS — R471 Dysarthria and anarthria: Secondary | ICD-10-CM

## 2020-05-27 DIAGNOSIS — R2689 Other abnormalities of gait and mobility: Secondary | ICD-10-CM

## 2020-05-27 DIAGNOSIS — I69354 Hemiplegia and hemiparesis following cerebral infarction affecting left non-dominant side: Secondary | ICD-10-CM | POA: Diagnosis not present

## 2020-05-27 DIAGNOSIS — M6281 Muscle weakness (generalized): Secondary | ICD-10-CM | POA: Diagnosis not present

## 2020-05-27 DIAGNOSIS — R1310 Dysphagia, unspecified: Secondary | ICD-10-CM | POA: Diagnosis not present

## 2020-05-27 NOTE — Patient Instructions (Signed)
Completing the lip and tongue exercises will make your muscles stronger

## 2020-05-27 NOTE — Patient Instructions (Signed)
Access Code: [email or web address] URL: [email or web address] Date: 05/27/2020 Prepared by: Baldomero Lamy  Exercises Sit to Stand - 1 x daily - 7 x weekly - 2 sets - 10 reps Heel Toe Raises with Unilateral Counter Support - 1 x daily - 5 x weekly - 2 sets - 10 reps Side Stepping with Resistance at Thighs and Counter Support - 1 x daily - 5 x weekly - 3 sets - 10 reps Marching with Resistance - 1 x daily - 5 x weekly - 3 sets - 10 reps Standing Hip Extension with Counter Support - 1 x daily - 5 x weekly - 2 sets - 10 reps Standing Hip Abduction with Counter Support - 1 x daily - 5 x weekly - 2 sets - 10 reps Mini Squat with Counter Support - 1 x daily - 5 x weekly - 2 sets - 10 reps Romberg Stance with Eyes Closed - 1 x daily - 5 x weekly - 1 sets - 3 reps - 30 sec hold Romberg Stance with Head Nods - 1 x daily - 5 x weekly - 2 sets - 10 reps Romberg Stance with Head Rotation - 1 x daily - 5 x weekly - 2 sets - 10 reps Standing Anterior Toe Taps - 1 x daily - 5 x weekly - 2 sets - 10 reps

## 2020-05-27 NOTE — Therapy (Signed)
[facility] [address] [place], [place], [address] Phone: [phone]   Fax:  [phone]  Speech Language Pathology Treatment  Patient Details  Name: JOSHUAN BOLANDER MRN: [id number] Date of Birth: 08-30-1951 Referring Provider (SLP): Reesa Chew (referral), [place], Betty, MD (documentation)   Encounter Date: 05/27/2020   End of Session - 05/27/20 1323    Visit Number 15    Number of Visits 17    Date for SLP Re-Evaluation 06/10/20    SLP Start Time 1021    SLP Stop Time  1102    SLP Time Calculation (min) 41 min    Activity Tolerance Patient tolerated treatment well           Past Medical History:  Diagnosis Date  . Barrett's esophagus   . Chronic back pain   . Gastritis    Mild  . Gastroparesis   . GERD (gastroesophageal reflux disease)   . Headache   . Hyperglycemia   . Hypertension   . Nephrolithiasis   . Renal cyst   . Stricture and stenosis of esophagus   . Stroke ([place]) 07-2015  . Vision abnormalities     Past Surgical History:  Procedure Laterality Date  . [place] SURGERY  2012  . COLONOSCOPY    . [place] SURGERY  2005, 2010   replaced L4 and L5  . UPPER GASTROINTESTINAL ENDOSCOPY      There were no vitals filed for this visit.   Subjective Assessment - 05/27/20 1029    Subjective Pt has not missed a dose of meds since beginning the alarm.    Currently in Pain? Yes    Pain Score 4     Pain Location Back    Pain Orientation Lower    Pain Descriptors / Indicators Sore    Pain Type Chronic pain                 ADULT SLP TREATMENT - 05/27/20 1032      General Information   Behavior/Cognition Alert;Cooperative;Pleasant mood      Treatment Provided   Treatment provided Cognitive-Linquistic      Cognitive-Linquistic Treatment   Treatment focused on Cognition;Dysarthria    Skilled Treatment Pt's first three utterances were unintelligible to SLP - pt repeated each  with slowed and exaggerated articulation to improve artic to an intelligible level. Pt forgot his external cue (his watch) for imoproving his intelligibility today. Pt reports comleting the HEP for oral motor strength "a few times a week". SLP reiterated to pt that oral motor strength is at the basis of improved articulation. Pt demonstrated decr'd strength as exercises were less precise after rep # 5 or 6 (pt prescribed 20 reps each). req'd mod cues usually on lip "pop" exercise to press and pop instead of pucker and pop with a kissing sound. Min-mod cues occasionally needed on labial ROM exercises to slow down and separate sounds.       Assessment / Recommendations / Plan   Plan Continue with current plan of care      Progression Toward Goals   Progression toward goals Not progressing toward goals (comment)   pt participation           SLP Education - 05/27/20 1323    Education Details rationale for HEP, need to do oral motor HEP BID, HEP procedure    Person(s) Educated Patient    Methods Explanation;Demonstration;Verbal cues    Comprehension Verbalized understanding;Returned demonstration;Verbal cues required  SLP Short Term Goals - 05/15/20 1438      SLP SHORT TERM GOAL #1   Title pt will improve emergent awareness with simple functional tasks to requiring min A rarely over three sessions    Period --   or 9 initital sessions for all STGs   Status Not Met      SLP SHORT TERM GOAL #2   Title pt will demo compensations for dysarthria in simple sentence responses generating at lest 90% intelligibility over 3 sessions    Baseline 04/17/20    Status Partially Met      SLP SHORT TERM GOAL #3   Title pt will demonstrate sustained attention for 15 minutes in mod complex tasks in 3 sessions    Status Not Met      SLP SHORT TERM GOAL #4   Title Pt will complete HEP for unintelligible speech with occaisonal mod A for articulation and loudnes    Baseline 04/17/20    Status  Achieved            SLP Long Term Goals - 05/27/20 1326      SLP LONG TERM GOAL #1   Title pt will demo selective attention to complete simple functional tasks in min noisy environment for 10 minutes    Baseline 05-22-20, 05-27-20    Time 1    Period Weeks   or 17 total sessions for all LTGs   Status On-going      SLP LONG TERM GOAL #2   Title pt will demo emergent awareness with mod complex tasks by double checkng work 100% of the time over three sessions    Time 1    Period Weeks    Status On-going      SLP LONG TERM GOAL #3   Title pt will demo speech volume in 8 minutes simple complex conversation, in low 70s dB average over three consecutive sessions    Time 1    Period Weeks    Status On-going      SLP LONG TERM GOAL #4   Title pt will produce 10 minutes intelligible simple-mod complex conversation using compensations, with rare min A over 3 consecutive sessions    Time 2    Period Weeks    Status On-going      SLP LONG TERM GOAL #5   Title Pt will complete HEP for unintelligible speech with occaisonal min A for articulation and loudness    Time 2    Period Weeks    Status On-going            Plan - 05/27/20 1324    Clinical Impression Statement Pt with premorbid dysarthria- mild. Today moderate-severe at times. He also cont to present today with mild-mod cognitive communication deficits characterized by decr'd attention and awareness. Pt admitted he is not completing oral-motor HEP to prescribed frequency. SLP explained HEP is the basis of progress with speech intelligibility. Pt acknowledged understanding. He may want to use 2-3 more visits and save remaining visits for later this year. Pt would benefit from skilled ST targeting cognitive linguistics as well as dysarthria.    Speech Therapy Frequency 2x / week    Duration --   8 weeks or 17 total sessions   Treatment/Interventions Oral motor exercises;Compensatory techniques;Functional tasks;SLP instruction and  feedback;Patient/family education;Internal/external aids;Cueing hierarchy;Cognitive reorganization    Potential to Achieve Goals Good    Potential Considerations Severity of impairments           Patient  will benefit from skilled therapeutic intervention in order to improve the following deficits and impairments:   Dysarthria and anarthria  Cognitive communication deficit    Problem List Patient Active Problem List   Diagnosis Date Noted  . Depressive reaction   . Sleep disturbance   . Urinary retention   . Bradycardia   . Essential hypertension   . Temperature elevated   . Left thyroid nodule   . Treatment-emergent central sleep apnea 10/25/2018  . 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 Chronic pain syndrome   . Chronic bilateral low back pain   . Benign essential HTN   . Tobacco abuse   . Marijuana abuse   . Hyperlipidemia   . History of CVA with residual deficit   . Dysphagia, post-stroke   . ICH (intracerebral hemorrhage) (HCC) - R thalmic/PLIC d/t HTN 01/74/9449  . Insomnia 07/13/2017  . PAD (peripheral artery disease) ([place]) 07/13/2017  . Hyperlipidemia with target LDL less than 70 07/13/2017  . Stroke-like symptom 09/22/2016  . Paresthesia 09/22/2016  . CVA (cerebral vascular accident) ([place]) 09/22/2016  . Cerebrovascular accident (CVA) due to thrombosis of left carotid artery ([place]) 09/23/2015  . Primary snoring 09/23/2015  . Hypersomnia with sleep apnea 09/23/2015  . Obstructive sleep apnea 09/23/2015  . Lacunar infarct, acute ([place]) 08/25/2015  . Sleep apnea 08/25/2015  . Dysarthria   . CVD (cardiovascular disease) 08/02/2015  . Acute hyperglycemia 08/02/2015  . Systolic hypertension with cerebrovascular disease 12/27/2014  . Epistaxis 07/28/2012  . HYPERGLYCEMIA 10/02/2010  . NEPHROLITHIASIS 05/23/2009  . BARRETTS ESOPHAGUS 02/20/2009  . Gastroparesis 02/20/2009  . RADICULOPATHY 10/21/2008  . GERD 10/08/2008  .  ESOPHAGITIS 08/15/2008  . ESOPHAGEAL STRICTURE 08/15/2008  . GASTRITIS, ACUTE 08/15/2008  . RENAL CYST 08/14/2008  . TOBACCO ABUSE 08/08/2008  . HEMATURIA, MICROSCOPIC, HX OF 08/08/2008  . PULMONARY NODULE 01/10/2008    [facility] ,MS, [place]  05/27/2020, 1:27 PM  [facility] [address] [place], [place], [address] Phone: [phone]   Fax:  [phone]   Name: [name] MRN: [id number] Date of Birth: 1951-06-11

## 2020-05-27 NOTE — Therapy (Signed)
[facility] [address] [place], [place], [address] Phone: [phone]   Fax:  [phone]  Physical Therapy Treatment  Patient Details  Name: Richard Vaughn MRN: [id number] Date of Birth: 01-17-1951 Referring Provider (PT): Reesa Chew PA-C   Encounter Date: 05/27/2020   PT End of Session - 05/27/20 1120    Visit Number 19    Number of Visits 22    Date for PT Re-Evaluation 06/16/20   POC for 6 weeks, Cert for 90 days   Authorization Type Medicare + BCBS (10th visit PN)    Progress Note Due on Visit 10    PT Start Time 1105   finishing up with ST   PT Stop Time 1148    PT Time Calculation (min) 43 min    Equipment Utilized During Treatment Gait belt    Activity Tolerance Patient tolerated treatment well    Behavior During Therapy WFL for tasks assessed/performed           Past Medical History:  Diagnosis Date   Barrett's esophagus    Chronic back pain    Gastritis    Mild   Gastroparesis    GERD (gastroesophageal reflux disease)    Headache    Hyperglycemia    Hypertension    Nephrolithiasis    Renal cyst    Stricture and stenosis of esophagus    Stroke [facility]) 07-2015   Vision abnormalities     Past Surgical History:  Procedure Laterality Date   CERVICAL DISC SURGERY  2012   COLONOSCOPY     LUMBAR [place] SURGERY  2005, 2010   replaced L4 and L5   UPPER GASTROINTESTINAL ENDOSCOPY      There were no vitals filed for this visit.   Subjective Assessment - 05/27/20 1117    Subjective Patient reports that he is going to think about visit limit and make decision by friday. No falls.    Patient is accompained by: Family member   Wife   Pertinent History Chronic Back Pain, Barrett's Esophagus, GERD, Hyperglycemia, HTN, CVA, Hyperlipidemia, vision abnormalities, ICH in the right thalamus/posterior limb of internal capsule    Limitations Standing;Walking;House hold activities    Patient  Stated Goals "want to walk", "get back to how I was"    Currently in Pain? Yes    Pain Location Back    Pain Orientation Lower    Pain Descriptors / Indicators Sore;Aching    Pain Type Chronic pain    Pain Onset More than a month ago                             [facility] Adult PT Treatment/Exercise - 05/27/20 0001      Transfers   Transfers Sit to Stand;Stand to Sit    Sit to Stand 5: Supervision    Stand to Sit 5: Supervision      Ambulation/Gait   Ambulation/Gait Yes    Ambulation/Gait Assistance 5: Supervision    Ambulation/Gait Assistance Details throughout therapy gym with activities    Assistive device None    Gait Pattern Step-through pattern;Decreased hip/knee flexion - left    Ambulation Surface Level;Indoor      Self-Care   Self-Care Other Self-Care Comments    Other Self-Care Comments  PT speaking with patient and wife regarding POC due to nearing visit limit. PT educating on options to use visits or discharge early and save some if need them  later in the year. Patient and wife unable to make decision today, and will continue to think about this and have decision by next visit.       Exercises   Exercises Other Exercises    Other Exercises  Reviewed current HEP and progressed as tolerated by patient.            Completed all of the following exercises and updated to include in HEP. PT providing verbal cues for technique, and pace with completion of activities. Educating patient and wife on proper completion.   Access Code: [email or web address] URL: [email or web address] Date: 05/27/2020 Prepared by: Baldomero Lamy  Exercises Sit to Stand - 1 x daily - 7 x weekly - 2 sets - 10 reps Heel Toe Raises with Unilateral Counter Support - 1 x daily - 5 x weekly - 2 sets - 10 reps Side Stepping with Resistance at Thighs and Counter Support - 1 x daily - 5 x weekly - 3 sets - 10 reps (completed with red theraband)  Marching with Resistance - 1 x daily -  5 x weekly - 3 sets - 10 reps (completed with red theraband)  Standing Hip Extension with Counter Support - 1 x daily - 5 x weekly - 2 sets - 10 reps Standing Hip Abduction with Counter Support - 1 x daily - 5 x weekly - 2 sets - 10 reps Mini Squat with Counter Support - 1 x daily - 5 x weekly - 2 sets - 10 reps Romberg Stance with Eyes Closed - 1 x daily - 5 x weekly - 1 sets - 3 reps - 30 sec hold Romberg Stance with Head Nods - 1 x daily - 5 x weekly - 2 sets - 10 reps Romberg Stance with Head Rotation - 1 x daily - 5 x weekly - 2 sets - 10 reps Standing Anterior Toe Taps - 1 x daily - 5 x weekly - 2 sets - 10 reps        PT Education - 05/27/20 1201    Education Details Educated on HEP Update    Person(s) Educated Patient;Spouse    Methods Explanation;Demonstration;Handout    Comprehension Verbalized understanding;Returned demonstration;Need further instruction            PT Short Term Goals - 05/08/20 1621      PT SHORT TERM GOAL #1   Title Pt will ambulate >600' on level surfaces mod I for improved mobility.    Baseline Ambulated 690 Mod I with Rollator    Time 3    Period Weeks    Status Achieved    Target Date 05/08/20      PT SHORT TERM GOAL #2   Title Pt will decrease 5x sit to stand from 24 sec to <22 sec for improved balance and functional strength.    Baseline 21.25 secs w/o UE support from mat    Time 3    Period Weeks    Status Achieved    Target Date 05/08/20             PT Long Term Goals - 04/17/20 1352      PT LONG TERM GOAL #1   Title Patient will be independent with final HEP    Baseline PT continues to add to HEP    Time 6    Period Weeks    Status On-going    Target Date 05/29/20      PT LONG TERM GOAL #2   Title Pt will  increase Berg from 44/56 to 47/56 or more for improved balance and decreased fall risk.    Baseline 36/56, 04/17/20 44/56    Time 6    Period Weeks    Status New    Target Date 05/29/20      PT LONG TERM GOAL #3    Title Pt will improve gait speed to >/=2.0 ft/sec. with LRAD to safely amb. in the community.    Baseline 1.36 ft    Time 6    Period Weeks    Status On-going    Target Date 05/29/20      PT LONG TERM GOAL #4   Title Pt will ambulate >800' on varies surfaces supervision for improved community mobility.    Time 6    Period Weeks    Status Revised    Target Date 05/29/20                 Plan - 05/27/20 1203    Clinical Impression Statement Due to nearing VL, PT spent time educating patient and wife on options going forward with POC. Spent rest of skilled session reviewing current HEP and progressing as tolerated by patient to allow for further improvements to be made outside of therapy session. Patient continue to demo progress with PT services and will further benefit from therapy services to maximize funcitonal mobility.    Personal Factors and Comorbidities Comorbidity 3+    Comorbidities Chronic Back Pain, Barrett's Esophagus, GERD, Hyperglycemia, HTN, CVA, Hyperlipidemia, vision abnormalities, ICH in the right thalamus/posterior limb of internal capsule    Examination-Activity Limitations Bed Mobility;Transfers;Stairs;Stand    Examination-Participation Restrictions Yard Work;Community Activity    Stability/Clinical Decision Making Evolving/Moderate complexity    Rehab Potential Good    PT Frequency 2x / week    PT Duration 6 weeks    PT Treatment/Interventions ADLs/Self Care Home Management;Electrical Stimulation;Moist Heat;Cryotherapy;Gait training;Stair training;Functional mobility training;Therapeutic activities;DME Instruction;Therapeutic exercise;Balance training;Neuromuscular re-education;Patient/family education;Orthotic Fit/Training;Passive range of motion;Visual/perceptual remediation/compensation    PT Next Visit Plan How did HEP go? Decision on POC? Find out anything about machines at [facility]?    Consulted and Agree with Plan of Care Patient;Family member/caregiver     Family Member Consulted Wife           Patient will benefit from skilled therapeutic intervention in order to improve the following deficits and impairments:  Abnormal gait, Decreased balance, Decreased endurance, Decreased mobility, Difficulty walking, Impaired vision/preception, Decreased knowledge of precautions, Decreased strength, Decreased safety awareness, Decreased knowledge of use of DME, Decreased activity tolerance  Visit Diagnosis: Hemiplegia and hemiparesis following cerebral infarction affecting left non-dominant side (HCC)  Other abnormalities of gait and mobility  Muscle weakness (generalized)  Unsteadiness on feet     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) 10/25/2018   Chronic pain syndrome    Chronic bilateral low back pain    Benign essential HTN    Tobacco abuse    Marijuana abuse    Hyperlipidemia    History of CVA with residual deficit    Dysphagia, post-stroke    ICH (intracerebral hemorrhage) ([place]) - R thalmic/PLIC d/t HTN 38/75/6433   Insomnia 07/13/2017   PAD (  peripheral artery disease) ([place]) 07/13/2017   Hyperlipidemia with target LDL less than 70 07/13/2017   Stroke-like symptom 09/22/2016   Paresthesia 09/22/2016   CVA (cerebral vascular accident) ([place]) 09/22/2016   Cerebrovascular accident (CVA) due to thrombosis of left carotid artery ([place]) 09/23/2015   Primary snoring 09/23/2015   Hypersomnia with sleep apnea 09/23/2015   Obstructive sleep apnea 09/23/2015   Lacunar infarct, acute ([place]) 08/25/2015   Sleep apnea 08/25/2015   Dysarthria    CVD (cardiovascular disease) 08/02/2015   Acute hyperglycemia  98/26/4158   Systolic hypertension with cerebrovascular disease 12/27/2014   Epistaxis 07/28/2012   HYPERGLYCEMIA 10/02/2010   NEPHROLITHIASIS 05/23/2009   BARRETTS ESOPHAGUS 02/20/2009   Gastroparesis 02/20/2009   RADICULOPATHY 10/21/2008   GERD 10/08/2008   ESOPHAGITIS 08/15/2008   ESOPHAGEAL STRICTURE 08/15/2008   GASTRITIS, ACUTE 08/15/2008   RENAL CYST 08/14/2008   TOBACCO ABUSE 08/08/2008   HEMATURIA, MICROSCOPIC, HX OF 08/08/2008   PULMONARY NODULE 01/10/2008    Jones Bales, PT, DPT 05/27/2020, 12:06 PM  [facility] [address] [place], [place], [address] Phone: [phone]   Fax:  [phone]  Name: Richard Vaughn MRN: [id number] Date of Birth: 07-Aug-1951

## 2020-05-28 ENCOUNTER — Telehealth: Payer: Self-pay

## 2020-05-28 NOTE — Telephone Encounter (Signed)
LVM for patient to return my call to schedule a mask fitting.Richard Vaughn

## 2020-05-29 ENCOUNTER — Ambulatory Visit: Payer: Medicare Other | Admitting: Speech Pathology

## 2020-05-29 ENCOUNTER — Other Ambulatory Visit: Payer: Self-pay

## 2020-05-29 ENCOUNTER — Ambulatory Visit: Payer: Medicare Other | Admitting: Occupational Therapy

## 2020-05-29 ENCOUNTER — Ambulatory Visit: Payer: Medicare Other

## 2020-05-29 DIAGNOSIS — R2681 Unsteadiness on feet: Secondary | ICD-10-CM | POA: Diagnosis not present

## 2020-05-29 DIAGNOSIS — M6281 Muscle weakness (generalized): Secondary | ICD-10-CM

## 2020-05-29 DIAGNOSIS — R1310 Dysphagia, unspecified: Secondary | ICD-10-CM

## 2020-05-29 DIAGNOSIS — R41841 Cognitive communication deficit: Secondary | ICD-10-CM

## 2020-05-29 DIAGNOSIS — R471 Dysarthria and anarthria: Secondary | ICD-10-CM

## 2020-05-29 DIAGNOSIS — I69354 Hemiplegia and hemiparesis following cerebral infarction affecting left non-dominant side: Secondary | ICD-10-CM | POA: Diagnosis not present

## 2020-05-29 DIAGNOSIS — R278 Other lack of coordination: Secondary | ICD-10-CM

## 2020-05-29 NOTE — Patient Instructions (Signed)
Signs of Aspiration Pneumonia    Chest pain/tightness  Fever (can be low grade)  Cough  o With foul-smelling phlegm (sputum) o With sputum containing pus or blood o With greenish sputum  Fatigue   Shortness of breath   Wheezing   **IF YOU HAVE THESE SIGNS, CONTACT YOUR DOCTOR OR GO TO THE EMERGENCY DEPARTMENT OR URGENT CARE AS SOON AS POSSIBLE**   Modified Barium Swallow can be done as an outpatient at the hospital. I will message Dr. [place] and recommend this. If the test shows any weakness in your throat that is causing residue (leftover food/drink) or aspiration, we can make a new plan to address exercises or strategies that might help with this.

## 2020-05-29 NOTE — Therapy (Signed)
[facility] [address] [place], [place], [address] Phone: [phone]   Fax:  [phone]  Occupational Therapy Treatment  Patient Details  Name: Richard Vaughn MRN: [id number] Date of Birth: 07/08/1951 Referring Provider (OT): Reesa Chew, [place]   Encounter Date: 05/29/2020   OT End of Session - 05/29/20 1147    Visit Number 17    Number of Visits 25    Date for OT Re-Evaluation 06/26/20    Authorization Type MCR, BS/BS    Authorization - Visit Number 55    Authorization - Number of Visits 20    Progress Note Due on Visit 20    OT Start Time 1100    OT Stop Time 1145    OT Time Calculation (min) 45 min    Activity Tolerance Patient tolerated treatment well    Behavior During Therapy [facility] for tasks assessed/performed           Past Medical History:  Diagnosis Date  . Barrett's esophagus   . Chronic back pain   . Gastritis    Mild  . Gastroparesis   . GERD (gastroesophageal reflux disease)   . Headache   . Hyperglycemia   . Hypertension   . Nephrolithiasis   . Renal cyst   . Stricture and stenosis of esophagus   . Stroke ([place]) 07-2015  . Vision abnormalities     Past Surgical History:  Procedure Laterality Date  . [place] SURGERY  2012  . COLONOSCOPY    . [place] SURGERY  2005, 2010   replaced L4 and L5  . UPPER GASTROINTESTINAL ENDOSCOPY      There were no vitals filed for this visit.   Subjective Assessment - 05/29/20 1110    Subjective  I don't have any further questions    Patient is accompanied by: Family member    Pertinent History Rt thalamic hemmorhage w/ Lt hemiparesis 02/22/20. PMH: Stroke 2019 w/ Lt hemiparesis, HLD, HTN, GERD, Barretts esophagus    Limitations fall risk, no driving, Lt shoulder rotator cuff tear (see MRI results 03/14/20)    Patient Stated Goals Return to normal, driving, baking    Currently in Pain? Yes    Pain Score --   fluctuates   Pain Location  Shoulder    Pain Orientation Left    Pain Descriptors / Indicators Aching;Sharp    Pain Type Chronic pain    Pain Onset More than a month ago    Pain Frequency Intermittent    Aggravating Factors  midrange pain arc    Pain Relieving Factors proper positioning, modifications prn            Assessed remaining goals and progress to date and discussed d/c due to limited visits remaining and wanting to save visits for after potential surgery to Lt shoulder - pt/wife agreeable. OT also unable to progress Lt shoulder further d/t rotator cuff tear.  UBE x 8 min, level 3 Pt performing environmental scanning finding 12/13 on first pass, missing 1 on Lt side. Do not feel this is consistent however as pt still misses things on the Lt but has improved since initial evaluation. Pt placing grooved pegs in pegboard Lt hand then removing 9 hole peg test Lt = 55.53 sec.                       OT Short Term Goals - 05/08/20 1556      OT SHORT TERM GOAL #  1   Title Pt will be mod I with HEP for coordination for LUE, balance    Time 6    Period Weeks    Status Achieved      OT SHORT TERM GOAL #2   Title Pt will consistently don socks and tie shoes    Time 6    Period Weeks    Status Achieved      OT SHORT TERM GOAL #3   Title Pt will demonstrate improved coordination as evidenced by decreasing time on 9 hole peg with LUE by at least 10 seconds to assist with functional fine motor tasks.    Baseline 69.06 sec    Time 6    Period Weeks    Status Achieved   51.43     OT SHORT TERM GOAL #4   Title Pt will report overall less drops Lt hand during the day attending to Lt side consistently    Time 6    Period Weeks    Status Achieved      OT SHORT TERM GOAL #5   Title Pt will perform dynamic standing tasks w/ supervision without LOB    Time 6    Period Weeks    Status Achieved             OT Long Term Goals - 05/29/20 1121      OT LONG TERM GOAL #1   Title Pt will be  independent with updated HEP including aquatic HEP prn    Time 12    Period Weeks    Status Partially Met      OT LONG TERM GOAL #2   Title Pt will be mod I with shower transfers    Time 12    Period Weeks    Status Not Met   awaiting for grab bars to be installed     OT LONG TERM GOAL #3   Title Pt will be mod I with hot meal prep with at least two items.    Time 12    Period Weeks    Status Partially Met   Pt currently cooking simple one meal dishes (oatmeal, egg)     OT LONG TERM GOAL #4   Title Pt demonstrate improved coordination as evidenced by decreasing time on 9 hole peg by at least 20 seconds with LUE.    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 Pt has met all STG'S but only partially met  some LTG's due to remaining deficits.    Occupational performance deficits (Please refer to evaluation for details): ADL's;IADL's;Leisure    Body Structure / Function / Physical Skills ADL;ROM;IADL;Balance;Body mechanics;Mobility;Strength;Coordination;FMC;Pain;UE functional use;Proprioception;Decreased knowledge of use of DME    Cognitive Skills Attention    Rehab Potential Good    Comorbidities Affecting Occupational Performance: Presence of  comorbidities impacting occupational performance    Comorbidities impacting occupational performance description: Lt rotator cuff tear    OT Frequency 2x / week    OT Duration 12 weeks    OT Treatment/Interventions Self-care/ADL training;Therapeutic exercise;Functional Mobility Training;Aquatic Therapy;Neuromuscular education;Manual Therapy;Splinting;Energy conservation;Therapeutic activities;DME and/or AE instruction;Cognitive remediation/compensation;Visual/perceptual remediation/compensation;Passive range of motion;Patient/family education;Moist Heat    Plan D/C O.T. due to limited visits and unable to progress Lt shoulder further d/t rotator cuff tear    Consulted and Agree with Plan of Care Patient           Patient will benefit from skilled therapeutic intervention in order to improve the following deficits and impairments:   Body Structure / Function / Physical Skills: ADL, ROM, IADL, Balance, Body mechanics, Mobility, Strength, Coordination, FMC, Pain, UE functional use, Proprioception, Decreased knowledge of use of DME Cognitive Skills: Attention     Visit Diagnosis: Hemiplegia and hemiparesis following cerebral infarction affecting left non-dominant side (HCC)  Muscle weakness (generalized)    Problem List Patient Active Problem List   Diagnosis Date Noted  . Depressive reaction   . Sleep disturbance   . Urinary retention   . Bradycardia   . Essential hypertension   . Temperature elevated   . Left thyroid nodule   . Treatment-emergent central sleep apnea 10/25/2018  . 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 Chronic pain syndrome   . Chronic bilateral low back pain   . Benign essential HTN   . Tobacco abuse   . Marijuana abuse   . Hyperlipidemia   . History of CVA with residual deficit   . Dysphagia, post-stroke   . ICH (intracerebral hemorrhage) (HCC) - R thalmic/PLIC d/t HTN 76/19/5093  . Insomnia 07/13/2017  . PAD  (peripheral artery disease) ([place]) 07/13/2017  . Hyperlipidemia with target LDL less than 70 07/13/2017  . Stroke-like symptom 09/22/2016  . Paresthesia 09/22/2016  . CVA (cerebral vascular accident) ([place]) 09/22/2016  . Cerebrovascular accident (CVA) due to thrombosis of left carotid artery ([place]) 09/23/2015  . Primary snoring 09/23/2015  . Hypersomnia with sleep apnea 09/23/2015  . Obstructive sleep apnea 09/23/2015  . Lacunar infarct, acute ([place]) 08/25/2015  . Sleep apnea 08/25/2015  . Dysarthria   . CVD (cardiovascular disease) 08/02/2015  . Acute hyperglycemia 08/02/2015  . Systolic hypertension with cerebrovascular disease 12/27/2014  . Epistaxis 07/28/2012  . HYPERGLYCEMIA 10/02/2010  . NEPHROLITHIASIS 05/23/2009  . BARRETTS ESOPHAGUS 02/20/2009  . Gastroparesis 02/20/2009  . RADICULOPATHY 10/21/2008  . GERD 10/08/2008  . ESOPHAGITIS 08/15/2008  . ESOPHAGEAL STRICTURE 08/15/2008  . GASTRITIS, ACUTE 08/15/2008  . RENAL CYST 08/14/2008  . TOBACCO ABUSE 08/08/2008  . HEMATURIA, MICROSCOPIC, HX OF 08/08/2008  . PULMONARY NODULE 01/10/2008   OCCUPATIONAL THERAPY DISCHARGE SUMMARY  Visits from Start of Care: 17  Current functional level related to goals / functional outcomes: See above    Remaining deficits: Lt shoulder pain and ROM (d/t rotator cuff tear more than from stroke) Coordination Balance Lt inattention   Education / Equipment: HEP's, visual scanning strategies and safety with walker  Plan: Patient agrees to discharge.  Patient goals were partially met. Patient  is being discharged due to                                                     Limited visits, unable to progress, and saving visits for potential surgery to Lt shoulder????       Carey Bullocks, OTR/L 05/29/2020, 4:51 PM  [facility] [address] [place], [place], [address] Phone: [phone]   Fax:  [phone]  Name:  Richard Vaughn MRN: [id number] Date of Birth: 02-17-1951

## 2020-05-29 NOTE — Therapy (Signed)
[facility] [address] [place], [place], [address] Phone: [phone]   Fax:  [phone]  Physical Therapy Treatment/Progress Note/Discharge Summary  Patient Details  Name: Richard Vaughn MRN: [id number] Date of Birth: 04/04/1951 Referring Provider (PT): Reesa Chew PA-C  PHYSICAL THERAPY DISCHARGE SUMMARY  Visits from Start of Care: 20  Current functional level related to goals / functional outcomes: See Clinical Impression Statement for more details.   Remaining deficits: Decreased coordination, Medium Risk for Falls   Education / Equipment: Educated on [employer], attending [employer] and walking program   Plan: Patient agrees to discharge.  Patient goals were partially met. Patient is being discharged due to meeting the stated rehab goals.  ?????            Encounter Date: 05/29/2020   PT End of Session - 05/29/20 1036    Visit Number 20    Number of Visits 22    Date for PT Re-Evaluation 06/16/20   POC for 6 weeks, Cert for 90 days   Authorization Type Medicare + [employer] (10th visit PN)    Progress Note Due on Visit 10    PT Start Time 1029   pt arriving late for session   PT Stop Time 1102    PT Time Calculation (min) 33 min    Equipment Utilized During Treatment Gait belt    Activity Tolerance Patient tolerated treatment well    Behavior During Therapy WFL for tasks assessed/performed           Past Medical History:  Diagnosis Date  . Barrett's esophagus   . Chronic back pain   . Gastritis    Mild  . Gastroparesis   . GERD (gastroesophageal reflux disease)   . Headache   . Hyperglycemia   . Hypertension   . Nephrolithiasis   . Renal cyst   . Stricture and stenosis of esophagus   . Stroke ([place]) 07-2015  . Vision abnormalities     Past Surgical History:  Procedure Laterality Date  . [place] SURGERY  2012  . COLONOSCOPY    . [place] SURGERY  2005, 2010   replaced L4 and L5  .  UPPER GASTROINTESTINAL ENDOSCOPY      There were no vitals filed for this visit.   Subjective Assessment - 05/29/20 1031    Subjective Patient reports that updated HEP went well. Patient reports that today will be last visit due to wanting to save visits for future.    Patient is accompained by: Family member   Wife   Pertinent History Chronic Back Pain, Barrett's Esophagus, GERD, Hyperglycemia, HTN, CVA, Hyperlipidemia, vision abnormalities, ICH in the right thalamus/posterior limb of internal capsule    Limitations Standing;Walking;House hold activities    Patient Stated Goals "want to walk", "get back to how I was"    Currently in Pain? Yes    Pain Score 3     Pain Location Back    Pain Orientation Lower    Pain Descriptors / Indicators Aching    Pain Type Chronic pain    Pain Onset More than a month ago                             [facility] Adult PT Treatment/Exercise - 05/29/20 0001      Transfers   Transfers Sit to Stand;Stand to Sit    Sit to Stand 6: Modified independent (Device/Increase time)  Stand to Sit 6: Modified independent (Device/Increase time)      Ambulation/Gait   Ambulation/Gait Yes    Ambulation/Gait Assistance 5: Supervision    Ambulation/Gait Assistance Details completed ambulation throughout therapy gym with activites    Assistive device None    Gait Pattern Step-through pattern;Decreased hip/knee flexion - left    Ambulation Surface Level;Indoor    Gait velocity 11.07 secs = 2.96 ft/sec      Standardized Balance Assessment   Standardized Balance Assessment Berg Balance Test      Berg Balance Test   Sit to Stand Able to stand without using hands and stabilize independently    Standing Unsupported Able to stand safely 2 minutes    Sitting with Back Unsupported but Feet Supported on Floor or Stool Able to sit safely and securely 2 minutes    Stand to Sit Sits safely with minimal use of hands    Transfers Able to transfer safely,  definite need of hands    Standing Unsupported with Eyes Closed Able to stand 10 seconds safely    Standing Ubsupported with Feet Together Able to place feet together independently and stand 1 minute safely    From Standing, Reach Forward with Outstretched Arm Can reach confidently >25 cm (10")    From Standing Position, Pick up Object from Floor Able to pick up shoe safely and easily    From Standing Position, Turn to Look Behind Over each Shoulder Looks behind from both sides and weight shifts well    Turn 360 Degrees Able to turn 360 degrees safely but slowly    Standing Unsupported, Alternately Place Feet on Step/Stool Able to stand independently and complete 8 steps >20 seconds    Standing Unsupported, One Foot in Front Able to take small step independently and hold 30 seconds    Standing on One Leg Tries to lift leg/unable to hold 3 seconds but remains standing independently    Total Score 47      Self-Care   Self-Care Other Self-Care Comments    Other Self-Care Comments  PT speaking with wife and patient about update on POC, patient and wife reporting feeling comfortable with discharge today to save visits for later in the year. PT educating on splitting up HEP to include strengthening exercises one day, balance exercises. All incorporating cardiovascular endurance vis recumbent bike at [facility] or walking outside.       Exercises   Exercises Other Exercises    Other Exercises  Reviewed final HEP to ensure independence with all exercises. Added SLS with support, focused on improved balance exercises today with patient completing all properly.                   PT Education - 05/29/20 1111    Education Details Educated on progress toward LTG's, HEP Update (SLS)    Person(s) Educated Patient;Spouse    Methods Explanation;Demonstration;Handout    Comprehension Verbalized understanding;Returned demonstration            PT Short Term Goals - 05/08/20 1621      PT SHORT TERM  GOAL #1   Title Pt will ambulate >600' on level surfaces mod I for improved mobility.    Baseline Ambulated 690 Mod I with Rollator    Time 3    Period Weeks    Status Achieved    Target Date 05/08/20      PT SHORT TERM GOAL #2   Title Pt will decrease 5x sit to stand from  24 sec to <22 sec for improved balance and functional strength.    Baseline 21.25 secs w/o UE support from mat    Time 3    Period Weeks    Status Achieved    Target Date 05/08/20             PT Long Term Goals - 05/29/20 1037      PT LONG TERM GOAL #1   Title Patient will be independent with final HEP    Baseline Patient reports independence with final HEP    Time 6    Period Weeks    Status Achieved      PT LONG TERM GOAL #2   Title Pt will increase Berg from 44/56 to 47/56 or more for improved balance and decreased fall risk.    Baseline 36/56, 04/17/20 44/56, 47/56    Time 6    Period Weeks    Status Achieved      PT LONG TERM GOAL #3   Title Pt will improve gait speed to >/=2.0 ft/sec. with LRAD to safely amb. in the community.    Baseline 1.36 ft/sec, 2.96 ft/sec on 7/15    Time 6    Period Weeks    Status Achieved      PT LONG TERM GOAL #4   Title Pt will ambulate >800' on varies surfaces supervision for improved community mobility.    Baseline unable to test due to time constraint with patient arriving late, patient able to ambulate >500' at supv level    Time 6    Period Weeks    Status Partially Met                 Plan - 05/29/20 1118    Clinical Impression Statement Today's skilled PT session focused on assessment of patient's progress toward LTG for discharge. PT unable to formally assess LTG #4 due to patient arriving late for session, however patient able to meet all other LTG today. Demosntrating improved balance with Berg score of 47/56, and improved gait speed to 2.97 ft/sec. Patient has demonstrated progress with PT services, and demonstrating readiness to discharge at  this time with patient and wife verbalizing agreement.    Personal Factors and Comorbidities Comorbidity 3+    Comorbidities Chronic Back Pain, Barrett's Esophagus, GERD, Hyperglycemia, HTN, CVA, Hyperlipidemia, vision abnormalities, ICH in the right thalamus/posterior limb of internal capsule    Examination-Activity Limitations Bed Mobility;Transfers;Stairs;Stand    Examination-Participation Restrictions Yard Work;Community Activity    Stability/Clinical Decision Making Evolving/Moderate complexity    Rehab Potential Good    PT Frequency 2x / week    PT Duration 6 weeks    PT Treatment/Interventions ADLs/Self Care Home Management;Electrical Stimulation;Moist Heat;Cryotherapy;Gait training;Stair training;Functional mobility training;Therapeutic activities;DME Instruction;Therapeutic exercise;Balance training;Neuromuscular re-education;Patient/family education;Orthotic Fit/Training;Passive range of motion;Visual/perceptual remediation/compensation    Consulted and Agree with Plan of Care Patient;Family member/caregiver    Family Member Consulted Wife           Patient will benefit from skilled therapeutic intervention in order to improve the following deficits and impairments:  Abnormal gait, Decreased balance, Decreased endurance, Decreased mobility, Difficulty walking, Impaired vision/preception, Decreased knowledge of precautions, Decreased strength, Decreased safety awareness, Decreased knowledge of use of DME, Decreased activity tolerance  Visit Diagnosis: Hemiplegia and hemiparesis following cerebral infarction affecting left non-dominant side (HCC)  Muscle weakness (generalized)  Unsteadiness on feet  Other lack of coordination     Problem List Patient Active Problem List   Diagnosis Date Noted  .  Depressive reaction   . Sleep disturbance   . Urinary retention   . Bradycardia   . Essential hypertension   . Temperature elevated   . Left thyroid nodule   .  Treatment-emergent central sleep apnea 10/25/2018  . 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 Chronic pain syndrome   . Chronic bilateral low back pain   . Benign essential HTN   . Tobacco abuse   . Marijuana abuse   . Hyperlipidemia   . History of CVA with residual deficit   . Dysphagia, post-stroke   . ICH (intracerebral hemorrhage) (HCC) - R thalmic/PLIC d/t HTN 30/14/8403  . Insomnia 07/13/2017  . PAD (peripheral artery disease) ([place]) 07/13/2017  . Hyperlipidemia with target LDL less than 70 07/13/2017  . Stroke-like symptom 09/22/2016  . Paresthesia 09/22/2016  . CVA (cerebral vascular accident) ([place]) 09/22/2016  . Cerebrovascular accident (CVA) due to thrombosis of left carotid artery ([place]) 09/23/2015  . Primary snoring 09/23/2015  . Hypersomnia with sleep apnea 09/23/2015  . Obstructive sleep apnea 09/23/2015  . Lacunar infarct, acute ([place]) 08/25/2015  . Sleep apnea 08/25/2015  . Dysarthria   . CVD (cardiovascular disease) 08/02/2015  . Acute hyperglycemia 08/02/2015  . Systolic hypertension with cerebrovascular disease 12/27/2014  . Epistaxis 07/28/2012  . HYPERGLYCEMIA 10/02/2010  . NEPHROLITHIASIS 05/23/2009  . BARRETTS ESOPHAGUS 02/20/2009  . Gastroparesis 02/20/2009  . RADICULOPATHY 10/21/2008  . GERD 10/08/2008  . ESOPHAGITIS 08/15/2008  . ESOPHAGEAL STRICTURE 08/15/2008  . GASTRITIS, ACUTE 08/15/2008  . RENAL CYST 08/14/2008  . TOBACCO ABUSE 08/08/2008  . HEMATURIA, MICROSCOPIC, HX OF 08/08/2008  . PULMONARY NODULE 01/10/2008    Jones Bales, PT, DPT 05/29/2020, 11:24 AM  [facility] [address] [place], [place], [address] Phone: [phone]   Fax:  [phone]  Name: BENNIE CHIRICO MRN: [id number] Date of Birth: 09-Feb-1951

## 2020-05-29 NOTE — Patient Instructions (Signed)
Access Code: [email or web address] URL: [email or web address] Date: 05/29/2020 Prepared by: Baldomero Lamy  Exercises Sit to Stand - 1 x daily - 7 x weekly - 2 sets - 10 reps Heel Toe Raises with Unilateral Counter Support - 1 x daily - 5 x weekly - 2 sets - 10 reps Side Stepping with Resistance at Thighs and Counter Support - 1 x daily - 5 x weekly - 3 sets - 10 reps Marching with Resistance - 1 x daily - 5 x weekly - 3 sets - 10 reps Standing Hip Extension with Counter Support - 1 x daily - 5 x weekly - 2 sets - 10 reps Standing Hip Abduction with Counter Support - 1 x daily - 5 x weekly - 2 sets - 10 reps Mini Squat with Counter Support - 1 x daily - 5 x weekly - 2 sets - 10 reps Romberg Stance with Eyes Closed - 1 x daily - 5 x weekly - 1 sets - 3 reps - 30 sec hold Romberg Stance with Head Nods - 1 x daily - 5 x weekly - 2 sets - 10 reps Romberg Stance with Head Rotation - 1 x daily - 5 x weekly - 2 sets - 10 reps Standing Anterior Toe Taps - 1 x daily - 5 x weekly - 2 sets - 10 reps Single Leg Stance with Support - 1 x daily - 5 x weekly - 1 sets - 3 reps - 30 hold

## 2020-05-30 NOTE — Therapy (Signed)
[facility] [address] [place], [place], [address] Phone: [phone]   Fax:  [phone]  Speech Language Pathology Treatment  Patient Details  Name: Richard Vaughn MRN: [id number] Date of Birth: [birth year] Referring Provider (SLP): Reesa Chew (referral), [place], Betty, MD (documentation)   Encounter Date: 05/29/2020   End of Session - 05/30/20 0906    Visit Number 16    Number of Visits 17    Date for SLP Re-Evaluation 06/10/20    SLP Start Time 1150    SLP Stop Time  1230    SLP Time Calculation (min) 40 min    Activity Tolerance Patient tolerated treatment well           Past Medical History:  Diagnosis Date  . Barrett's esophagus   . Chronic back pain   . Gastritis    Mild  . Gastroparesis   . GERD (gastroesophageal reflux disease)   . Headache   . Hyperglycemia   . Hypertension   . Nephrolithiasis   . Renal cyst   . Stricture and stenosis of esophagus   . Stroke ([place]) 07-2015  . Vision abnormalities     Past Surgical History:  Procedure Laterality Date  . [place] SURGERY  2012  . COLONOSCOPY    . [place] SURGERY  2005, 2010   replaced L4 and L5  . UPPER GASTROINTESTINAL ENDOSCOPY      There were no vitals filed for this visit.   Subjective Assessment - 05/30/20 0850    Subjective "Do you like doughnuts?"    Patient is accompained by: Family member   wife   Currently in Pain? No/denies   none presently                ADULT SLP TREATMENT - 05/30/20 0851      General Information   Behavior/Cognition Alert;Cooperative;Pleasant mood      Treatment Provided   Treatment provided Cognitive-Linquistic;Dysphagia      Cognitive-Linquistic Treatment   Treatment focused on Patient/family/caregiver education;Dysarthria;Cognition    Skilled Treatment Patient and wife discussed therapy plan at home and pt wishes to d/c from [place] today, save visits for later in the year. SLP  agreed that this is a good plan for pt, and suggested that if neuropsych testing in October indicates pt may benefit from further cognitive therapy, that would be a good time to follow up with SLP. Focused session on pt/family education re: activities for home practice and answering pt/wife's questions. Wife brought up concerns about patient's swallowing, and pt agreed he is coughing with meals, more with solids than liquids. He feels residue in his throat after swallowing. SLP reviewed pt's history and previous GI examinations; he had upper endoscopy in March 2020 with Barrett's esophagus noted. Patient was evaluated clinically, no instrumentally, by SLP for swallowing on acute and cleared for regular diet. Questionable vocal quality change with thin liquids today, and multiple swallows noted. Pt also had complaints of residue with regular solid granola bar, which did not subside with repeated swallows. SLP had pt tuck chin and use liquid wash which ultimately reduced the sensation. SLP has noted wet vocal quality intermittently (without POs)and pt reports coughing on saliva occasionally. Given reports of difficulty with meals SLP educated pt and wife on possible impacts of CVA on swallowing function, vs signs and symptoms of esophageal dysphagia (and referred sensation) which may be difficult to differentiate without instrumental testing (MBS). Provided handout with signs of aspiration PNA  and will message PCP/neuro re: advising instrumental swallow study. Educated pt that if testing shows pharyngeal dysphagia he can return to OP SLP at that time for focus on dysphagia if warranted.      Assessment / Recommendations / Plan   Plan Discharge SLP treatment due to (comment)   patient request     Progression Toward Goals   Progression toward goals --   goals not met; pt wishes to d/c at this time           SLP Education - 05/30/20 0905    Education Details signs and symptoms of dysphagia, instrumental  testing, signs of aspiration PNA, cognitive/speech activities for home    Person(s) Educated Patient;Spouse    Methods Explanation;Handout    Comprehension Verbalized understanding            SLP Short Term Goals - 05/30/20 0909      SLP SHORT TERM GOAL #1   Title pt will improve emergent awareness with simple functional tasks to requiring min A rarely over three sessions    Period --   or 9 initital sessions for all STGs   Status Not Met      SLP SHORT TERM GOAL #2   Title pt will demo compensations for dysarthria in simple sentence responses generating at lest 90% intelligibility over 3 sessions    Baseline 04/17/20    Status Partially Met      SLP SHORT TERM GOAL #3   Title pt will demonstrate sustained attention for 15 minutes in mod complex tasks in 3 sessions    Status Not Met      SLP SHORT TERM GOAL #4   Title Pt will complete HEP for unintelligible speech with occaisonal mod A for articulation and loudnes    Baseline 04/17/20    Status Achieved            SLP Long Term Goals - 05/30/20 0909      SLP LONG TERM GOAL #1   Title pt will demo selective attention to complete simple functional tasks in min noisy environment for 10 minutes    Baseline 05-22-20, 05-27-20    Time 1    Period Weeks   or 17 total sessions for all LTGs   Status Partially Met      SLP LONG TERM GOAL #2   Title pt will demo emergent awareness with mod complex tasks by double checkng work 100% of the time over three sessions    Time 1    Period Weeks    Status Not Met      SLP LONG TERM GOAL #3   Title pt will demo speech volume in 8 minutes simple complex conversation, in low 70s dB average over three consecutive sessions    Time 1    Period Weeks    Status Not Met      SLP LONG TERM GOAL #4   Title pt will produce 10 minutes intelligible simple-mod complex conversation using compensations, with rare min A over 3 consecutive sessions    Time 2    Period Weeks    Status Not Met      SLP  LONG TERM GOAL #5   Title Pt will complete HEP for unintelligible speech with occaisonal min A for articulation and loudness    Time 2    Period Weeks    Status Not Met            Plan - 05/30/20 1657  Clinical Impression Statement Pt with premorbid dysarthria- mild. Today moderate-severe at times. He also cont to present today with mild-mod cognitive communication deficits characterized by decr'd attention and awareness. Pt admitted he is not completing oral-motor HEP to prescribed frequency. Patient discussed treatment plan with wife and determined he would like to d/c from SLP today, save visits for later in the year. Patient's wife reporting dysphagia so SLP educated pt/wife on signs of dysphagia and aspiration PNA. Feel pt would benefit from instrumental testing (MBS) to differentiate/rule out oropharyngeal dysphagia vs esophageal dysphagia (given hx, Barrett's esophagus).    Speech Therapy Frequency --   d/c   Duration --   d/c   Treatment/Interventions Oral motor exercises;Compensatory techniques;Functional tasks;SLP instruction and feedback;Patient/family education;Internal/external aids;Cueing hierarchy;Cognitive reorganization    Potential to Achieve Goals Good    Potential Considerations Severity of impairments    Consulted and Agree with Plan of Care Patient;Family member/caregiver           Patient will benefit from skilled therapeutic intervention in order to improve the following deficits and impairments:   Dysarthria and anarthria  Cognitive communication deficit  Dysphagia, unspecified type    Problem List Patient Active Problem List   Diagnosis Date Noted  . Depressive reaction   . Sleep disturbance   . Urinary retention   . Bradycardia   . Essential hypertension   . Temperature elevated   . Left thyroid nodule   . Treatment-emergent central sleep apnea 10/25/2018  . 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 Chronic  pain syndrome   . Chronic bilateral low back pain   . Benign essential HTN   . Tobacco abuse   . Marijuana abuse   . Hyperlipidemia   . History of CVA with residual deficit   . Dysphagia, post-stroke   . ICH (intracerebral hemorrhage) (HCC) - R thalmic/PLIC d/t HTN 45/36/4680  . Insomnia 07/13/2017  . PAD (peripheral artery disease) ([place]) 07/13/2017  . Hyperlipidemia with target LDL less than 70 07/13/2017  . Stroke-like symptom 09/22/2016  . Paresthesia 09/22/2016  . CVA (cerebral vascular accident) ([place]) 09/22/2016  . Cerebrovascular accident (CVA) due to thrombosis of left carotid artery ([place]) 09/23/2015  . Primary snoring 09/23/2015  . Hypersomnia with sleep apnea 09/23/2015  . Obstructive sleep apnea 09/23/2015  . Lacunar infarct, acute ([place]) 08/25/2015  . Sleep apnea 08/25/2015  . Dysarthria   . CVD (cardiovascular disease) 08/02/2015  . Acute hyperglycemia 08/02/2015  . Systolic hypertension with cerebrovascular disease 12/27/2014  . Epistaxis 07/28/2012  . HYPERGLYCEMIA 10/02/2010  . NEPHROLITHIASIS 05/23/2009  . BARRETTS ESOPHAGUS 02/20/2009  . Gastroparesis 02/20/2009  . RADICULOPATHY 10/21/2008  . GERD 10/08/2008  . ESOPHAGITIS 08/15/2008  . ESOPHAGEAL STRICTURE 08/15/2008  . GASTRITIS, ACUTE 08/15/2008  . RENAL CYST 08/14/2008  . TOBACCO ABUSE 08/08/2008  . HEMATURIA, MICROSCOPIC, HX OF 08/08/2008  . PULMONARY NODULE 01/10/2008   SPEECH THERAPY DISCHARGE SUMMARY  Visits from Start of Care: 16  Current functional level related to goals / functional outcomes: Patient requires min-mod cues with HEP for dysarthria. Requires occasional min-mod cues to use intelligibility strategies in simple conversation.   Remaining deficits: Mild-moderate cognitive deficits, moderate-severe dysarthria. Question oropharyngeal dysphagia; advise MBS to r/o.   Education / Equipment: Signs of dysphagia and aspiration PNA, objective swallow testing, cognitive and speech practice  at home, neuropsych testing in October may help determine if pt may benefit from further therapy for cognition later this year.  Plan: Patient agrees to discharge.  Patient goals were not met. Patient is being discharged due to the patient's request.  ?????         Deneise Lever, [place], CCC-SLP Speech-Language Pathologist  Aliene Altes 05/30/2020, 9:11 AM  [facility] [address] [place], [place], [address] Phone: [phone]   Fax:  [phone]   Name: Richard Vaughn MRN: [id number] Date of Birth: Sep 08, 1951

## 2020-06-03 ENCOUNTER — Ambulatory Visit: Payer: Medicare Other

## 2020-06-03 ENCOUNTER — Ambulatory Visit: Payer: Medicare Other | Admitting: Occupational Therapy

## 2020-06-04 ENCOUNTER — Ambulatory Visit
Admission: RE | Admit: 2020-06-04 | Discharge: 2020-06-04 | Disposition: A | Discharge status: Home / Self Care | Payer: Federal, State, Local not specified - PPO | Source: Ambulatory Visit | Attending: Adult Health | Admitting: Adult Health

## 2020-06-04 DIAGNOSIS — I619 Nontraumatic intracerebral hemorrhage, unspecified: Secondary | ICD-10-CM | POA: Diagnosis not present

## 2020-06-05 ENCOUNTER — Ambulatory Visit: Payer: Federal, State, Local not specified - PPO

## 2020-06-05 ENCOUNTER — Ambulatory Visit: Payer: Medicare Other | Admitting: Occupational Therapy

## 2020-06-09 NOTE — Progress Notes (Signed)
Yes start aspirin 81 mg daily and ok for neurological clearance for rotator cuff surgery

## 2020-06-10 ENCOUNTER — Other Ambulatory Visit: Payer: Self-pay | Admitting: Adult Health

## 2020-06-10 ENCOUNTER — Telehealth: Payer: Self-pay | Admitting: *Deleted

## 2020-06-10 ENCOUNTER — Encounter: Payer: Federal, State, Local not specified - PPO | Admitting: Occupational Therapy

## 2020-06-10 ENCOUNTER — Ambulatory Visit: Payer: Federal, State, Local not specified - PPO

## 2020-06-10 MED ORDER — ASPIRIN EC 81 MG PO TBEC: 81.0000 mg | DELAYED_RELEASE_TABLET | Freq: Every day | ORAL | 11 refills | Status: AC

## 2020-06-10 NOTE — Telephone Encounter (Signed)
-----   Message from Frann Rider, NP sent at 06/10/2020 10:17 AM EDT ----- Please advise patient that recent CT head showed resolution of prior hemorrhage without residual blood products.  Discussed with Dr. Leonie Man who recommended restarting aspirin 81 mg daily for secondary stroke prevention and cleared to pursue rotator cuff procedure (this was also discussed during prior visit)

## 2020-06-11 DIAGNOSIS — M75122 Complete rotator cuff tear or rupture of left shoulder, not specified as traumatic: Secondary | ICD-10-CM | POA: Diagnosis not present

## 2020-06-12 ENCOUNTER — Ambulatory Visit: Payer: Federal, State, Local not specified - PPO | Admitting: Physical Therapy

## 2020-06-12 ENCOUNTER — Encounter: Payer: Federal, State, Local not specified - PPO | Admitting: Occupational Therapy

## 2020-06-16 ENCOUNTER — Other Ambulatory Visit: Payer: Self-pay | Admitting: Orthopedic Surgery

## 2020-06-17 ENCOUNTER — Ambulatory Visit (INDEPENDENT_AMBULATORY_CARE_PROVIDER_SITE_OTHER): Payer: Medicare Other | Admitting: Family Medicine

## 2020-06-17 ENCOUNTER — Ambulatory Visit (INDEPENDENT_AMBULATORY_CARE_PROVIDER_SITE_OTHER)
Admission: RE | Admit: 2020-06-17 | Discharge: 2020-06-17 | Disposition: A | Discharge status: Home / Self Care | Payer: Medicare Other | Source: Ambulatory Visit | Attending: Family Medicine | Admitting: Family Medicine

## 2020-06-17 ENCOUNTER — Encounter: Payer: Self-pay | Admitting: Family Medicine

## 2020-06-17 ENCOUNTER — Other Ambulatory Visit: Payer: Self-pay

## 2020-06-17 VITALS — BP 128/70 | HR 87 | Temp 99.0°F | Resp 16 | Ht 74.0 in | Wt 235.2 lb

## 2020-06-17 DIAGNOSIS — E785 Hyperlipidemia, unspecified: Secondary | ICD-10-CM | POA: Diagnosis not present

## 2020-06-17 DIAGNOSIS — G8929 Other chronic pain: Secondary | ICD-10-CM

## 2020-06-17 DIAGNOSIS — R509 Fever, unspecified: Secondary | ICD-10-CM | POA: Diagnosis not present

## 2020-06-17 DIAGNOSIS — R059 Cough, unspecified: Secondary | ICD-10-CM

## 2020-06-17 DIAGNOSIS — R05 Cough: Secondary | ICD-10-CM

## 2020-06-17 DIAGNOSIS — M25512 Pain in left shoulder: Secondary | ICD-10-CM

## 2020-06-17 DIAGNOSIS — I10 Essential (primary) hypertension: Secondary | ICD-10-CM

## 2020-06-17 DIAGNOSIS — G4700 Insomnia, unspecified: Secondary | ICD-10-CM | POA: Diagnosis not present

## 2020-06-17 DIAGNOSIS — I2510 Atherosclerotic heart disease of native coronary artery without angina pectoris: Secondary | ICD-10-CM | POA: Diagnosis not present

## 2020-06-17 NOTE — Patient Instructions (Addendum)
If we have ordered labs or studies at this visit, it can take up to 1-2 weeks for results and processing. IF results require follow up or explanation, we will call you with instructions. Clinically stable results will be released to your [facility]. If you have not heard from [place] or cannot find your results in [facility] in 2 weeks please contact our office at [phone].  If you are not yet signed up for [facility], please consider signing up.  A few things to remember from today's visit:   Essential hypertension - Plan: Basic metabolic panel  Hyperlipidemia with target LDL less than 70  Insomnia, unspecified type  Cough - Plan: CBC with Differential/Platelet, C-reactive protein, DG Chest 2 View  Monitor for new symptoms. COVID 19 test recommended. Mucinex to continue. Chest X ray today. Continue tylenol.  If you need refills please call your pharmacy. Do not use My Chart to request refills or for acute issues that need immediate attention.    Please be sure medication list is accurate. If a new problem present, please set up appointment sooner than planned today.

## 2020-06-17 NOTE — Progress Notes (Signed)
HPI: Richard Vaughn is a 69 y.o. male, who is here today with his wife for chronic disease management.  He was last seen on 03/17/20.  HTN: He is on Benicar 20 mg daily. Negative for unusual/frequant headache,visual changes,palpitations or edema. No new focal neurologic deficit.  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 He is doing PT and following with neuro.  Component     Latest Ref Rng & Units 03/03/2020          Sodium     135 - 146 mmol/L 139  Potassium     3.5 - 5.3 mmol/L 3.8  Chloride     98 - 110 mmol/L 104  CO2     20 - 32 mmol/L 26  Glucose     65 - 99 mg/dL 106 (H)  BUN     7 - 25 mg/dL 14  Creatinine     0.70 - 1.25 mg/dL 1.14  Calcium     8.6 - 10.3 mg/dL 9.2  GFR, Est Non African American     >60 mL/min >60  GFR, Est African American     >60 mL/min >60  Anion gap     5 - 15 9   He has lost some wt. PT and aquatic exercises + trying to follow a healthier diet.  HLD: He is not on pharmacologic treatment.  Lab Results  Component Value Date   CHOL 125 02/22/2020   HDL 35 (L) 02/22/2020   LDLCALC 73 02/22/2020   TRIG 86 02/22/2020   CHOLHDL 3.6 02/22/2020   Insomnia: He is on Trazodone 25-50 mg daily. Sleeping about 8 hours. No side effects.  Left shoulder pain,rotator cuff repair  He is asking it is safe for him to have shoulder surgery, No hx of trauma.  His wife is reporting that he had fever last night , 100.8 F oral and "crackles" coming from right lung at night. He has not noted wheezing,CP, or SOB. He has had non productive cough for 4-5 days. Taking Tylenol, last dose earlier today.  His wife is concerned about possible aspiration pneumonia because he has "swallowing problems" sometimes. COVID 19 vaccinated. No sick contact or recent travel.  Still smoking. Negative for associated abdominal pain,nausea,vomiting,or diarrhea.  Review of Systems  Constitutional: Positive for fatigue. Negative for activity  change, appetite change and fever.  HENT: Positive for postnasal drip. Negative for congestion, nosebleeds, rhinorrhea and sore throat.   Eyes: Negative for redness and visual disturbance.  Genitourinary: Negative for decreased urine volume, dysuria and hematuria.  Musculoskeletal: Positive for arthralgias.  Neurological: Negative for syncope.  Psychiatric/Behavioral: Negative for confusion and hallucinations.  Rest of ROS, see pertinent positives sand negatives in HPI  Current Outpatient Medications on File Prior to Visit  Medication Sig Dispense Refill   aspirin EC 81 MG tablet Take 1 tablet (81 mg total) by mouth daily. Swallow whole. 30 tablet 11   esomeprazole (NEXIUM) 40 MG capsule Take 1 capsule (40 mg total) by mouth 2 (two) times daily before a meal. Please keep your August appointment for further refills. 60 capsule 1   oxymorphone (OPANA) 10 MG tablet Take 10 mg by mouth in the morning and at bedtime.      traZODone (DESYREL) 50 MG tablet Take 1 tablet (50 mg total) by mouth at bedtime as needed for sleep. 90 tablet 1   olmesartan (BENICAR) 20 MG tablet Take 20 mg by mouth daily.     No current facility-administered medications  on file prior to visit.     Past Medical History:  Diagnosis Date   Barrett's esophagus    Chronic back pain    Gastritis    Mild   Gastroparesis    GERD (gastroesophageal reflux disease)    Headache    Hyperglycemia    Hypertension    Nephrolithiasis    Renal cyst    Stricture and stenosis of esophagus    Stroke ([place]) 07-2015   Vision abnormalities    No Known Allergies  Social History   Socioeconomic History   Marital status: Married    Spouse name: Sunny Gains   Number of children: 3   Years of education: Not on file   Highest education level: Not on file  Occupational History   Occupation: retired    Fish farm manager: [employer]  Tobacco Use   Smoking status: Former Smoker    Packs/day: 0.50    Years:  30.00    Pack years: 15.00    Types: Cigarettes    Quit date: 04/15/2018    Years since quitting: 2.1   Smokeless tobacco: Never Used  Substance and Sexual Activity   Alcohol use: No    Alcohol/week: 0.0 standard drinks   Drug use: Never    Comment: last use 04/2018   Sexual activity: Yes  Other Topics Concern   Not on file  Social History Narrative   Not on file   Social Determinants of Health   Financial Resource Strain:    Difficulty of Paying Living Expenses:   Food Insecurity:    Worried About Charity fundraiser in the Last Year:    Arboriculturist in the Last Year:   Transportation Needs:    Film/video editor (Medical):    Lack of Transportation (Non-Medical):   Physical Activity:    Days of Exercise per Week:    Minutes of Exercise per Session:   Stress:    Feeling of Stress :   Social Connections:    Frequency of Communication with Friends and Family:    Frequency of Social Gatherings with Friends and Family:    Attends Religious Services:    Active Member of Clubs or Organizations:    Attends Archivist Meetings:    Marital Status:     Vitals:   06/17/20 0848  BP: 128/70  Pulse: 87  Resp: 16  Temp: 99 F (37.2 C)  SpO2: 95%   Wt Readings from Last 3 Encounters:  06/17/20 235 lb 4 oz (106.7 kg)  05/26/20 238 lb (108 kg)  03/19/20 245 lb (111.1 kg)   Body mass index is 30.2 kg/m.  Physical Exam Nursing note reviewed.  Constitutional:      General: He is not in acute distress.    Appearance: He is well-developed.  HENT:     Head: Normocephalic and atraumatic.     Mouth/Throat:     Mouth: Mucous membranes are moist.     Pharynx: Oropharynx is clear.  Eyes:     Conjunctiva/sclera: Conjunctivae normal.  Cardiovascular:     Rate and Rhythm: Normal rate and regular rhythm.     Pulses:          Dorsalis pedis pulses are 2+ on the right side and 2+ on the left side.     Heart sounds: No murmur heard.    Pulmonary:     Effort: Pulmonary effort is normal. No respiratory distress.     Breath sounds: Normal breath  sounds. No wheezing or rales.  Abdominal:     Palpations: Abdomen is soft. There is no hepatomegaly or mass.     Tenderness: There is no abdominal tenderness.  Lymphadenopathy:     Cervical: No cervical adenopathy.  Skin:    General: Skin is warm.     Findings: No erythema or rash.  Neurological:     Mental Status: He is alert and oriented to person, place, and time.     Comments: Mildly unstable gait, not assisted.   Psychiatric:        Mood and Affect: Mood and affect normal.     Comments: Well groomed, good eye contact.     ASSESSMENT AND PLAN:  Richard Vaughn was seen today for chronic disease management.  Orders Placed This Encounter  Procedures   DG Chest 2 View   Basic metabolic panel   CBC with Differential/Platelet   C-reactive protein   Lab Results  Component Value Date   CREATININE 0.96 06/17/2020   BUN 12 06/17/2020   NA 139 06/17/2020   K 3.7 06/17/2020   CL 105 06/17/2020   CO2 25 06/17/2020   Lab Results  Component Value Date   CRP 91.7 (H) 06/17/2020   Lab Results  Component Value Date   WBC 11.3 (H) 06/17/2020   HGB 13.8 06/17/2020   HCT 40.7 06/17/2020   MCV 91.5 06/17/2020   PLT 193 06/17/2020   Insomnia, unspecified type Problem is well controlled. Continue Trazodone 25-50 mg daily at bedtime. Good sleep hygiene.  Essential hypertension BP adequately controlled. No changes in current management. Low salt diet.  Hyperlipidemia with target LDL less than 70 Refused statin medication. Strongly recommend smoking cessation.  Cough Lung auscultation negative for rales. Viral illness most likely. Even though he is vaccinated, COVID 19 test is recommended. Adequate hydration,contact precautions,and rest. OTC Plain Mucinex may help with cough, Monitor for new symptoms. Instructed about warning signs. Further  recommendations according to CXR and lab results.  Chronic left shoulder pain I recommend holding on surgery for now, at least until 08/2020 (6 months after CVA). Following with ortho.   Return in about 6 months (around 12/18/2020) for Needs AWV.Marland Kitchen    Jc Veron G. Martinique, MD  [facility]. [place] office.   If we have ordered labs or studies at this visit, it can take up to 1-2 weeks for results and processing. IF results require follow up or explanation, we will call you with instructions. Clinically stable results will be released to your [facility]. If you have not heard from [place] or cannot find your results in [facility] in 2 weeks please contact our office at [phone].  If you are not yet signed up for [facility], please consider signing up.  A few things to remember from today's visit:   Essential hypertension - Plan: Basic metabolic panel  Hyperlipidemia with target LDL less than 70  Insomnia, unspecified type  Cough - Plan: CBC with Differential/Platelet, C-reactive protein, DG Chest 2 View  Monitor for new symptoms. COVID 19 test recommended. Mucinex to continue. Chest X ray today. Continue tylenol.  If you need refills please call your pharmacy. Do not use My Chart to request refills or for acute issues that need immediate attention.    Please be sure medication list is accurate. If a new problem present, please set up appointment sooner than planned today.

## 2020-06-18 LAB — BASIC METABOLIC PANEL
BUN: 12 mg/dL (ref 7–25)
CO2: 25 mmol/L (ref 20–32)
Calcium: 9.2 mg/dL (ref 8.6–10.3)
Chloride: 105 mmol/L (ref 98–110)
Creat: 0.96 mg/dL (ref 0.70–1.25)
Glucose, Bld: 105 mg/dL — ABNORMAL HIGH (ref 65–99)
Potassium: 3.7 mmol/L (ref 3.5–5.3)
Sodium: 139 mmol/L (ref 135–146)

## 2020-06-18 LAB — CBC WITH DIFFERENTIAL/PLATELET
Absolute Monocytes: 1096 cells/uL — ABNORMAL HIGH (ref 200–950)
Basophils Absolute: 34 cells/uL (ref 0–200)
Basophils Relative: 0.3 %
Eosinophils Absolute: 45 cells/uL (ref 15–500)
Eosinophils Relative: 0.4 %
HCT: 40.7 % (ref 38.5–50.0)
Hemoglobin: 13.8 g/dL (ref 13.2–17.1)
Lymphs Abs: 1446 cells/uL (ref 850–3900)
MCH: 31 pg (ref 27.0–33.0)
MCHC: 33.9 g/dL (ref 32.0–36.0)
MCV: 91.5 fL (ref 80.0–100.0)
MPV: 10.7 fL (ref 7.5–12.5)
Monocytes Relative: 9.7 %
Neutro Abs: 8678 cells/uL — ABNORMAL HIGH (ref 1500–7800)
Neutrophils Relative %: 76.8 %
Platelets: 193 10*3/uL (ref 140–400)
RBC: 4.45 10*6/uL (ref 4.20–5.80)
RDW: 12.1 % (ref 11.0–15.0)
Total Lymphocyte: 12.8 %
WBC: 11.3 10*3/uL — ABNORMAL HIGH (ref 3.8–10.8)

## 2020-06-18 LAB — C-REACTIVE PROTEIN: CRP: 91.7 mg/L — ABNORMAL HIGH (ref <8.0)

## 2020-06-19 ENCOUNTER — Encounter: Payer: Self-pay | Admitting: Family Medicine

## 2020-06-20 ENCOUNTER — Other Ambulatory Visit: Payer: Self-pay

## 2020-06-20 MED ORDER — DOXYCYCLINE HYCLATE 100 MG PO TABS
100.0000 mg | ORAL_TABLET | Freq: Two times a day (BID) | ORAL | 0 refills | Status: DC
Start: 2020-06-20 — End: 2020-06-23

## 2020-06-20 MED ORDER — AMOXICILLIN-POT CLAVULANATE 875-125 MG PO TABS
1.0000 | ORAL_TABLET | Freq: Two times a day (BID) | ORAL | 0 refills | Status: DC
Start: 2020-06-20 — End: 2020-06-23

## 2020-06-23 ENCOUNTER — Other Ambulatory Visit: Payer: Self-pay

## 2020-06-23 ENCOUNTER — Ambulatory Visit (INDEPENDENT_AMBULATORY_CARE_PROVIDER_SITE_OTHER): Payer: Medicare Other

## 2020-06-23 ENCOUNTER — Ambulatory Visit (HOSPITAL_COMMUNITY)
Admission: EM | Admit: 2020-06-23 | Discharge: 2020-06-23 | Disposition: A | Discharge status: Home / Self Care | Payer: Medicare Other | Attending: Family Medicine | Admitting: Family Medicine

## 2020-06-23 ENCOUNTER — Encounter (HOSPITAL_COMMUNITY): Payer: Self-pay

## 2020-06-23 DIAGNOSIS — R05 Cough: Secondary | ICD-10-CM | POA: Diagnosis not present

## 2020-06-23 DIAGNOSIS — R509 Fever, unspecified: Secondary | ICD-10-CM | POA: Insufficient documentation

## 2020-06-23 DIAGNOSIS — Z20822 Contact with and (suspected) exposure to covid-19: Secondary | ICD-10-CM | POA: Insufficient documentation

## 2020-06-23 DIAGNOSIS — J439 Emphysema, unspecified: Secondary | ICD-10-CM | POA: Diagnosis not present

## 2020-06-23 DIAGNOSIS — J189 Pneumonia, unspecified organism: Secondary | ICD-10-CM | POA: Insufficient documentation

## 2020-06-23 DIAGNOSIS — J90 Pleural effusion, not elsewhere classified: Secondary | ICD-10-CM | POA: Diagnosis not present

## 2020-06-23 DIAGNOSIS — R059 Cough, unspecified: Secondary | ICD-10-CM

## 2020-06-23 LAB — CBC
HCT: 38.4 % — ABNORMAL LOW (ref 39.0–52.0)
Hemoglobin: 13 g/dL (ref 13.0–17.0)
MCH: 30.5 pg (ref 26.0–34.0)
MCHC: 33.9 g/dL (ref 30.0–36.0)
MCV: 90.1 fL (ref 80.0–100.0)
Platelets: 286 10*3/uL (ref 150–400)
RBC: 4.26 MIL/uL (ref 4.22–5.81)
RDW: 12.6 % (ref 11.5–15.5)
WBC: 14 10*3/uL — ABNORMAL HIGH (ref 4.0–10.5)
nRBC: 0 % (ref 0.0–0.2)

## 2020-06-23 LAB — SARS CORONAVIRUS 2 (TAT 6-24 HRS): SARS Coronavirus 2: POSITIVE — AB

## 2020-06-23 MED ORDER — LIDOCAINE HCL (PF) 1 % IJ SOLN
INTRAMUSCULAR | Status: AC
  Filled 2020-06-23: qty 2

## 2020-06-23 MED ORDER — CEFTRIAXONE SODIUM 1 G IJ SOLR
INTRAMUSCULAR | Status: AC
  Filled 2020-06-23: qty 10

## 2020-06-23 MED ORDER — DOXYCYCLINE HYCLATE 100 MG PO CAPS
100.0000 mg | ORAL_CAPSULE | Freq: Two times a day (BID) | ORAL | 0 refills | Status: DC
Start: 2020-06-23 — End: 2020-07-04

## 2020-06-23 MED ORDER — AZITHROMYCIN 250 MG PO TABS: ORAL_TABLET | ORAL | 0 refills | Status: DC

## 2020-06-23 MED ORDER — PREDNISONE 20 MG PO TABS
40.0000 mg | ORAL_TABLET | Freq: Every day | ORAL | 0 refills | Status: AC
Start: 2020-06-23 — End: 2020-06-28

## 2020-06-23 MED ORDER — CEFTRIAXONE SODIUM 1 G IJ SOLR
1.0000 g | Freq: Once | INTRAMUSCULAR | Status: AC
  Administered 2020-06-23: 1 g via INTRAMUSCULAR

## 2020-06-23 MED ORDER — PREDNISONE 20 MG PO TABS
40.0000 mg | ORAL_TABLET | Freq: Every day | ORAL | 0 refills | Status: DC
Start: 2020-06-23 — End: 2020-06-23

## 2020-06-23 MED ORDER — BENZONATATE 100 MG PO CAPS: 100.0000 mg | ORAL_CAPSULE | Freq: Three times a day (TID) | ORAL | 0 refills | Status: DC | PRN

## 2020-06-23 MED ORDER — DOXYCYCLINE HYCLATE 100 MG PO CAPS
100.0000 mg | ORAL_CAPSULE | Freq: Two times a day (BID) | ORAL | 0 refills | Status: DC
Start: 2020-06-23 — End: 2020-06-23

## 2020-06-23 MED ORDER — AZITHROMYCIN 250 MG PO TABS
ORAL_TABLET | ORAL | 0 refills | Status: DC
Start: 2020-06-23 — End: 2020-06-23

## 2020-06-23 NOTE — ED Notes (Signed)
The atx that were ordered for pt, pt didn't take, because they were not aware dr ordered them.

## 2020-06-23 NOTE — Discharge Instructions (Signed)
Start prednisone 40 mg once daily starting today.  Start both antibiotics azithromycin and doxycycline today.  Hydrate well with water encourage any food as tolerated.  Monitor temperature and if fever goes above 102.5 go immediately to the emergency department as this is indication of worsening infection.  Results of COVID-19 test will be available within the next 24 hours.  If patient is Covid positive he will need to quarantine for a total of 10 days.  If any of his symptoms worsen or he develops problems with breathing go immediately to the emergency department.

## 2020-06-23 NOTE — ED Provider Notes (Signed)
[place]    CSN: [id number] Arrival date & time: 06/23/20  0938      History   Chief Complaint Chief Complaint  Patient presents with  . Cough    HPI Richard Vaughn is a 69 y.o. male.   HPI  Patient presents for evaluation of 3 weeks of gradually worsening cough and fevers.  Patient was seen by his primary care provider on 8 3 and had a negative chest x-ray along with a CBC that showed a mildly elevated white count.  Per wife patient was unaware that the provider had actually sent over a prescription for empiric treatment of pneumonia until medications were reconciled today.  She reports not being notified by the provider's office or the pharmacy that there was a prescription waiting to be picked up.  Patient has continued to have fevers, some shortness of breath poor appetite in generalized malaise.  Patient is a current occasional smoker.  He has been fully vaccinated against Covid 19 and has not had a known exposure.  He wishes to be Covid tested today.  He has a sense of taste and smell.  Endorses a poor appetite although to hydrate well with fluids.  He has not taken any medication and has a low-grade fever on arrival today.  Past Medical History:  Diagnosis Date  . Barrett's esophagus   . Chronic back pain   . Gastritis    Mild  . Gastroparesis   . GERD (gastroesophageal reflux disease)   . Headache   . Hyperglycemia   . Hypertension   . Nephrolithiasis   . Renal cyst   . Stricture and stenosis of esophagus   . Stroke ([place]) 07-2015  . Vision abnormalities     Patient Active Problem List   Diagnosis Date Noted  . Depressive reaction   . Sleep disturbance   . Urinary retention   . Bradycardia   . Essential hypertension   . Temperature elevated   . Left thyroid nodule   . Treatment-emergent central sleep apnea 10/25/2018  . 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 Chronic pain syndrome   . Chronic bilateral low  back pain   . Benign essential HTN   . Tobacco abuse   . Marijuana abuse   . Hyperlipidemia   . History of CVA with residual deficit   . Dysphagia, post-stroke   . ICH (intracerebral hemorrhage) (HCC) - R thalmic/PLIC d/t HTN 18/29/9371  . Insomnia 07/13/2017  . PAD (peripheral artery disease) ([place]) 07/13/2017  . Hyperlipidemia with target LDL less than 70 07/13/2017  . Stroke-like symptom 09/22/2016  . Paresthesia 09/22/2016  . CVA (cerebral vascular accident) ([place]) 09/22/2016  . Cerebrovascular accident (CVA) due to thrombosis of left carotid artery ([place]) 09/23/2015  . Primary snoring 09/23/2015  . Hypersomnia with sleep apnea 09/23/2015  . Obstructive sleep apnea 09/23/2015  . Lacunar infarct, acute ([place]) 08/25/2015  . Sleep apnea 08/25/2015  . Dysarthria   . CVD (cardiovascular disease) 08/02/2015  . Acute hyperglycemia 08/02/2015  . Systolic hypertension with cerebrovascular disease 12/27/2014  . Epistaxis 07/28/2012  . HYPERGLYCEMIA 10/02/2010  . NEPHROLITHIASIS 05/23/2009  . BARRETTS ESOPHAGUS 02/20/2009  . Gastroparesis 02/20/2009  . RADICULOPATHY 10/21/2008  . GERD 10/08/2008  . ESOPHAGITIS 08/15/2008  . ESOPHAGEAL STRICTURE 08/15/2008  . GASTRITIS, ACUTE 08/15/2008  . RENAL CYST 08/14/2008  . TOBACCO ABUSE 08/08/2008  . HEMATURIA, MICROSCOPIC, HX OF 08/08/2008  . PULMONARY NODULE 01/10/2008    Past Surgical History:  Procedure Laterality Date  . [place] SURGERY  2012  . COLONOSCOPY    . [place] SURGERY  2005, 2010   replaced L4 and L5  . UPPER GASTROINTESTINAL ENDOSCOPY         Home Medications    Prior to Admission medications   Medication Sig Start Date End Date Taking? Authorizing Provider  amoxicillin-clavulanate (AUGMENTIN) 875-125 MG tablet Take 1 tablet by mouth 2 (two) times daily for 7 days. 06/20/20 06/27/20  Martinique, [name], MD  aspirin EC 81 MG tablet Take 1 tablet (81 mg total) by mouth daily. Swallow whole. 06/10/20   Frann Rider, NP  doxycycline (VIBRA-TABS) 100 MG tablet Take 1 tablet (100 mg total) by mouth 2 (two) times daily for 7 days. 06/20/20 06/27/20  Martinique, [name], MD  esomeprazole (NEXIUM) 40 MG capsule Take 1 capsule (40 mg total) by mouth 2 (two) times daily before a meal. Please keep your August appointment for further refills. 05/06/20   Armbruster, Carlota Raspberry, MD  olmesartan (BENICAR) 20 MG tablet Take 20 mg by mouth daily.    [provider]  oxymorphone (OPANA) 10 MG tablet Take 10 mg by mouth in the morning and at bedtime.     [provider]  traZODone (DESYREL) 50 MG tablet Take 1 tablet (50 mg total) by mouth at bedtime as needed for sleep. 04/21/20   Martinique, [name], MD    Family History Family History  Problem Relation Age of Onset  . Hypertension Father   . Stroke Father   . Hypertension Mother   . Liver cancer Mother   . Hypertension Sister        2 other sisters has also  . Stroke Sister   . Stroke Sister   . Hypertension Brother        2nd brother has also  . Colon cancer Neg Hx   . Esophageal cancer Neg Hx   . Rectal cancer Neg Hx   . Stomach cancer Neg Hx   . Colon polyps Neg Hx     Social History Social History   Tobacco Use  . Smoking status: Former Smoker    Packs/day: 0.50    Years: 30.00    Pack years: 15.00    Types: Cigarettes    Quit date: 04/15/2018    Years since quitting: 2.1  . Smokeless tobacco: Never Used  Substance Use Topics  . Alcohol use: No    Alcohol/week: 0.0 standard drinks  . Drug use: Never    Comment: last use 04/2018     Allergies   Patient has no known allergies.   Review of Systems Review of Systems Pertinent negatives listed in HPI  Physical Exam Triage Vital Signs ED Triage Vitals  Enc Vitals Group     BP 06/23/20 0920 124/75     Pulse Rate 06/23/20 0920 100     Resp 06/23/20 0920 18     Temp 06/23/20 0920 99.7 F (37.6 C)     Temp Source 06/23/20 0920 Oral     SpO2 06/23/20 0920 95 %     Weight  06/23/20 0921 235 lb (106.6 kg)     Height 06/23/20 0921 6\' 2"  (1.88 m)     Head Circumference --      Peak Flow --      Pain Score 06/23/20 0921 0     Pain Loc --      Pain Edu? --      Excl.  in [place]? --    No data found.  Updated Vital Signs BP 124/75   Pulse 100   Temp 99.7 F (37.6 C) (Oral)   Resp 18   Ht 6\' 2"  (1.88 m)   Wt 235 lb (106.6 kg)   SpO2 95%   BMI 30.17 kg/m   Visual Acuity Right Eye Distance:   Left Eye Distance:   Bilateral Distance:    Right Eye Near:   Left Eye Near:    Bilateral Near:     Physical Exam Constitutional:      General: He is not in acute distress.    Appearance: He is ill-appearing.  HENT:     Head: Normocephalic and atraumatic.     Mouth/Throat:     Lips: Pink.     Tongue: Lesions present. Tongue deviates from midline.     Pharynx: Oropharynx is clear. Uvula midline. Posterior oropharyngeal erythema present.  Eyes:     General: Lids are normal.  Cardiovascular:     Rate and Rhythm: Tachycardia present.     Heart sounds: No murmur heard.  No gallop.   Pulmonary:     Breath sounds: Decreased air movement present. Examination of the right-middle field reveals rales. Examination of the right-lower field reveals rales. Rales present. No wheezing.     Comments: Increase effort with air entry  Musculoskeletal:     Cervical back: Full passive range of motion without pain.  Skin:    General: Skin is warm.     Capillary Refill: Capillary refill takes less than 2 seconds.  Neurological:     Mental Status: He is alert and oriented to person, place, and time. Mental status is at baseline.  Psychiatric:        Attention and Perception: Attention and perception normal.        Mood and Affect: Mood and affect normal.        Speech: Speech normal.        Behavior: Behavior is cooperative.        Cognition and Memory: Cognition normal.        Judgment: Judgment normal.      UC Treatments / Results  Labs (all labs ordered are  listed, but only abnormal results are displayed) Labs Reviewed  CBC - Abnormal; Notable for the following components:      Result Value   WBC 14.0 (*)    HCT 38.4 (*)    All other components within normal limits  SARS CORONAVIRUS 2 (TAT 6-24 HRS)    EKG   Radiology DG Chest 2 View  Result Date: 06/23/2020 CLINICAL DATA:  Fever and cough. EXAM: CHEST - 2 VIEW COMPARISON:  Chest x-ray 06/17/2020 FINDINGS: The cardiac silhouette, mediastinal and hilar contours are within normal limits and stable. Stable aortic calcifications. Stable underlying emphysematous changes and mild hyperinflation. Right basilar density consistent with pneumonia. Suspect small parapneumonic effusion. IMPRESSION: Right basilar pneumonia. Electronically Signed   By: Marijo Sanes M.D.   On: 06/23/2020 10:14    Procedures Procedures (including critical care time)  Medications Ordered in UC Medications - No data to display  Initial Impression / Assessment and Plan / UC Course  I have reviewed the triage vital signs and the nursing notes.  Pertinent labs & imaging results that were available during my care of the patient were reviewed by me and considered in my medical decision making (see chart for details).     Patient presents today with 3 weeks of chest  congestion, fever, fatigue.  Chest x-ray shows a right basilar pneumonia.  CBC mildly elevated white count compared to CBC collected 6 days prior.  Will initiate treatment for community-acquired pneumonia with azithromycin and doxycycline.  Prednisone 40 mg once daily to help improve increased work of breathing.  Tessalon Perles 100- 200 mg 3 times daily as needed for cough.  Red flags discussed indicating need to follow-up in ER.  COVID-19 test is pending.  If results positive patient advised he will need to quarantine for total of 10 days.  Patient is fully vaccinated against COVID-19. Final Clinical Impressions(s) / UC Diagnoses   Final diagnoses:  Community  acquired pneumonia of right lower lobe of lung  Fever, unspecified fever cause  Cough  Suspected COVID-19 virus infection     Discharge Instructions     Start prednisone 40 mg once daily starting today.  Start both antibiotics azithromycin and doxycycline today.  Hydrate well with water encourage any food as tolerated.  Monitor temperature and if fever goes above 102.5 go immediately to the emergency department as this is indication of worsening infection.  Results of COVID-19 test will be available within the next 24 hours.  If patient is Covid positive he will need to quarantine for a total of 10 days.  If any of his symptoms worsen or he develops problems with breathing go immediately to the emergency department.     predniSONE (DELTASONE) 20 MG tablet Take 2 tablets (40 mg total) by mouth daily with breakfast for 5 days. 10 tablet Scot Jun, FNP   azithromycin (ZITHROMAX) 250 MG tablet Take 2 tabs PO x 1 dose, then 1 tab PO QD x 4 days 6 tablet Scot Jun, FNP   benzonatate (TESSALON) 100 MG capsule Take 1-2 capsules (100-200 mg total) by mouth 3 (three) times daily as needed for cough. 40 capsule Scot Jun, FNP   doxycycline (VIBRAMYCIN) 100 MG capsule Take 1 capsule (100 mg total) by mouth 2 (two) times daily. 20 capsule Scot Jun, FNP     PDMP not reviewed this encounter.   Scot Jun, FNP 06/23/20 1116

## 2020-06-23 NOTE — ED Triage Notes (Addendum)
Pt c/o non productive cough, chest congestionx3 wks. Feverx1 wk. Highest fever was 100.20F per wife. Per wife, pt had a chest x-ray 8/3 and it was clear.

## 2020-06-24 ENCOUNTER — Encounter: Payer: Self-pay | Admitting: Family Medicine

## 2020-06-24 ENCOUNTER — Telehealth: Payer: Self-pay | Admitting: *Deleted

## 2020-06-24 NOTE — Telephone Encounter (Signed)
Received form and completed signed.  Fax confirmation received [phone]. To MR.

## 2020-06-25 IMAGING — CT CT ANGIO NECK
2 of 9 series · 7 of 33 positions shown · IV contrast (omnipaque)
Comparison: Plain head CT earlier tonight. CTA head and neck
09/23/2016.

CLINICAL DATA: 68-year-old male code stroke presentation including
rightward gaze. Small acute hemorrhage in the right thalamus.

EXAM:
CT ANGIOGRAPHY HEAD AND NECK
TECHNIQUE: Multidetector CT imaging of the head and neck was performed using
the standard protocol during bolus administration of intravenous
contrast. Multiplanar CT image reconstructions and MIPs were
obtained to evaluate the vascular anatomy. Carotid stenosis
measurements (when applicable) are obtained utilizing NASCET
criteria, using the distal internal carotid diameter as the
denominator.
CONTRAST:  75mL OMNIPAQUE IOHEXOL 350 MG/ML SOLN

[Series 5: cta neck/head · axial · 0.58mm/px · z∈[+1292,+1424]mm · 2 of 200 slices shown]
[im 67/200  soft-tissue]
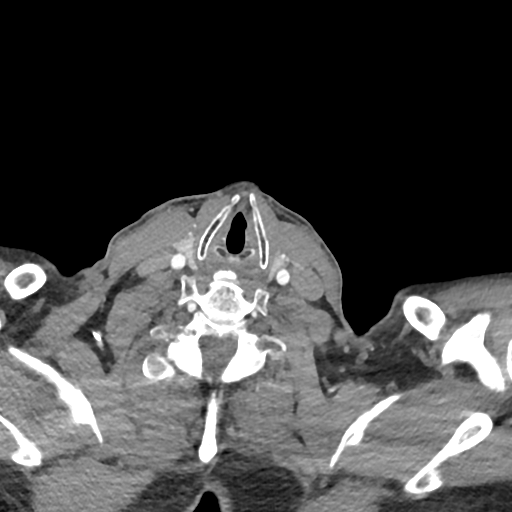
[im 133/200  soft-tissue]
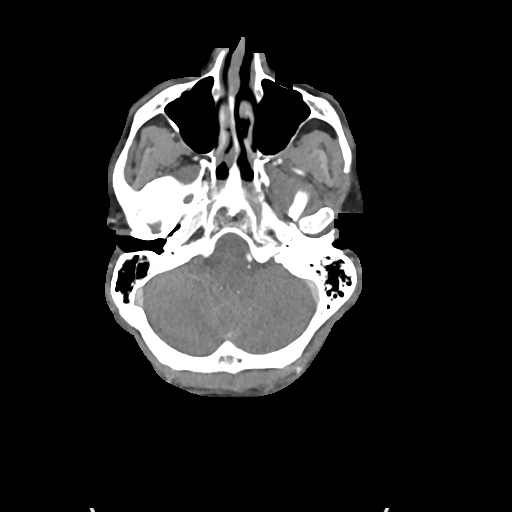

[Series 7: ax thins · axial · 0.39mm/px · z∈[+1227,+1492]mm · 5 of 399 slices shown]
[im 67/399  soft-tissue]
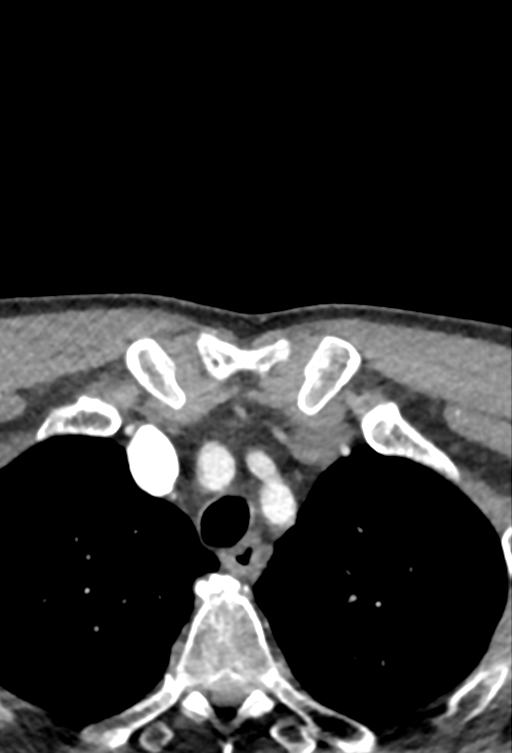
[im 133/399  bone]
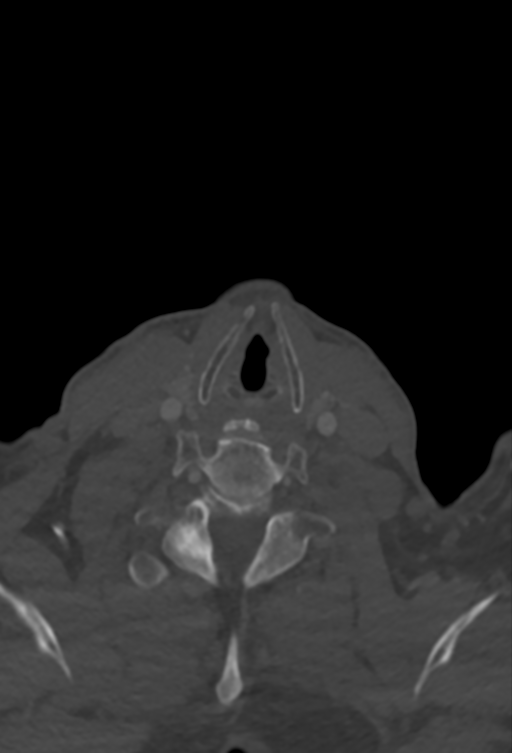
[im 200/399  soft-tissue]
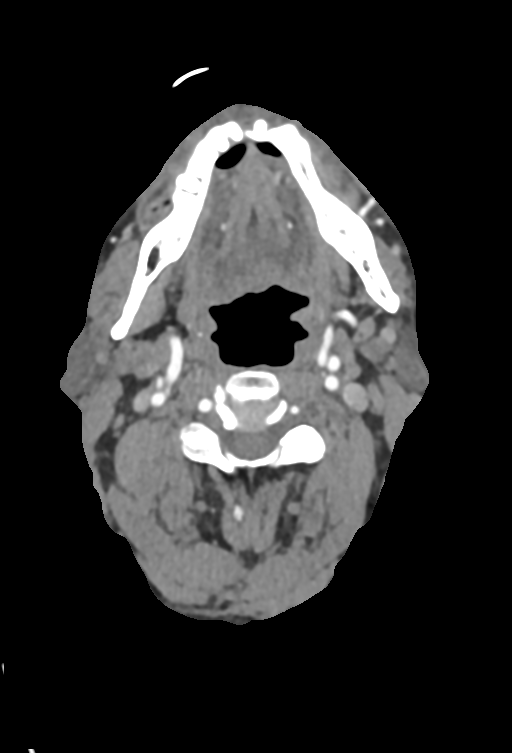
[im 266/399  bone]
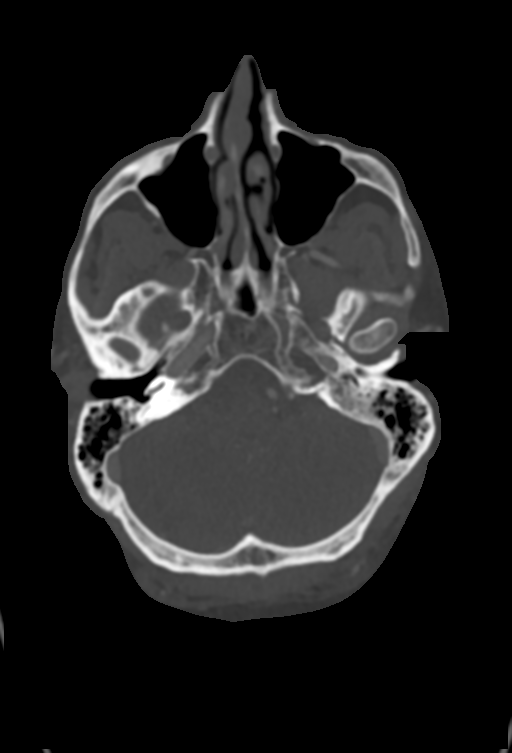
[im 332/399  soft-tissue]
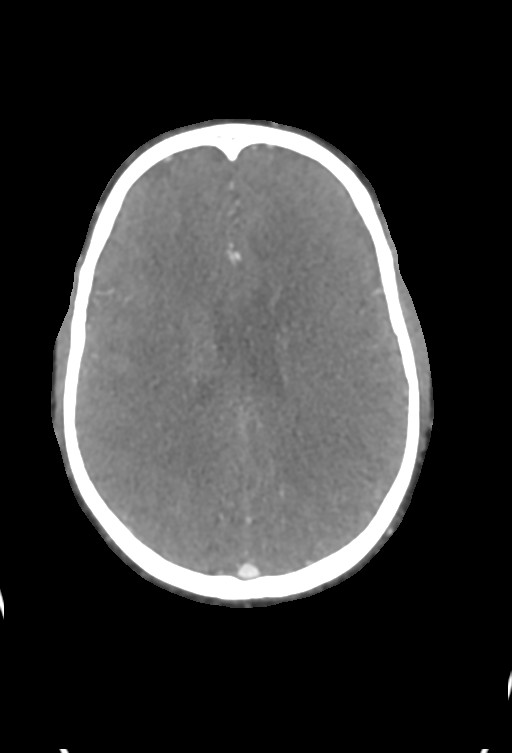

[7 of 33 positions shown; findings below may reference images not displayed]

FINDINGS: CTA NECK

Skeleton: Occasional dental caries. No acute osseous abnormality
identified.

Upper chest: Centrilobular emphysema again noted. No superior
mediastinal lymphadenopathy.

Other neck: Mild chronic thyromegaly. There is a 16 mm nodule in the
left thyroid lobe which meets consensus criteria for ultrasound
follow-up (ref: [HOSPITAL]. [DATE]): 143-50).

Additionally, there is 14 mm round mildly hyperenhancing soft tissue
nodule in the left parapharyngeal space inseparable from the deep
lobe of the left parotid gland. This was about 12 mm in 7971 and has
been visible on brain MRI since 08/02/2015 (12-13 cm at that time).

No superimposed cervical lymphadenopathy and otherwise negative neck
soft tissues.

Aortic arch: Mild Calcified aortic atherosclerosis. 3 vessel arch
configuration.

Right carotid system: Negative.

Left carotid system: Negative.

Vertebral arteries:
Mild soft and calcified plaque near the right vertebral artery
origin but no origin stenosis. The right vertebral appears dominant
and is patent to the skull base without stenosis.

Minimal plaque in the proximal left subclavian artery without
stenosis. Non dominant left vertebral artery origin appears stenotic
due to plaque on series 8, image 202, at least moderate stenosis.
The left vertebral is non dominant but patent to the skull base
without additional stenosis.

CTA HEAD

Posterior circulation: The non dominant left vertebral V4 segment is
diminutive but patent to the basilar. Patent left PICA origin.
Dominant right vertebral artery supplies the basilar without
stenosis. Normal right PICA origin. Patent basilar artery without
stenosis. Patent SCA and PCA origins. Bilateral PCA branches are
stable. There is mild right P1/P2 irregularity but no significant
stenosis.

Anterior circulation: Both ICA siphons are patent. There is mild
bilateral calcified plaque with no stenosis. Normal ophthalmic
artery origins. Patent carotid termini. Patent MCA and ACA origins.
Tortuous A1 segments. Normal anterior communicating artery.
Bilateral ACA branches are within normal limits. Left MCA M1 segment
and bifurcation are patent without stenosis. Right MCA M1 segment
and bifurcation are patent without stenosis. Bilateral MCA branches
appear stable since 7971, no significant stenosis.

Right thalamic blood redemonstrated. No CTA spot sign identified.

Venous sinuses: Patent.

Anatomic variants: Dominant right vertebral artery.

Review of the MIP images confirms the above findings
IMPRESSION: 1. Stable arterial findings since a 7971 CTA:
No large vessel occlusion.
Minimal atherosclerosis in the head and neck - although there is at
least moderate stenosis suspected at the origin of the non-dominant
left vertebral artery origin.

2. No CTA spot sign associated with the right thalamic hemorrhage.

3. Other findings:
- there is a small chronic 14 mm round soft tissue mass located in
the left parapharyngeal space which may be most likely a benign
mixed tumor (KIOVANA) arising from the deep lobe of the left parotid
gland. This appears not significantly changed since a 1711 brain
MRI.
- a 14 mm left thyroid lobe nodule meets consensus criteria for
dedicated Thyroid Ultrasound follow-up.

## 2020-06-26 ENCOUNTER — Other Ambulatory Visit: Payer: Self-pay | Admitting: Orthopedic Surgery

## 2020-06-27 ENCOUNTER — Encounter (HOSPITAL_COMMUNITY): Admission: RE | Admit: 2020-06-27 | Payer: Medicare Other | Source: Ambulatory Visit

## 2020-07-02 ENCOUNTER — Telehealth: Payer: Self-pay

## 2020-07-02 NOTE — Telephone Encounter (Signed)
Left message for pt to call back.  He has an appt on Friday, 8-20 with [facility].  He tested positive for Covid on 06-23-20. Need to see if he is still having any Sx.  Friday will be day 11.  When patient calls back, please have him speak to Barb Merino, RN as I will be out of the office on Thursday.

## 2020-07-03 ENCOUNTER — Ambulatory Visit: Payer: Medicare Other | Admitting: Adult Health

## 2020-07-04 ENCOUNTER — Ambulatory Visit (INDEPENDENT_AMBULATORY_CARE_PROVIDER_SITE_OTHER): Payer: Medicare Other | Admitting: Gastroenterology

## 2020-07-04 ENCOUNTER — Encounter: Payer: Self-pay | Admitting: Gastroenterology

## 2020-07-04 VITALS — BP 110/74 | HR 72 | Ht 74.0 in | Wt 231.6 lb

## 2020-07-04 DIAGNOSIS — I2510 Atherosclerotic heart disease of native coronary artery without angina pectoris: Secondary | ICD-10-CM

## 2020-07-04 DIAGNOSIS — Z86010 Personal history of colonic polyps: Secondary | ICD-10-CM

## 2020-07-04 DIAGNOSIS — K219 Gastro-esophageal reflux disease without esophagitis: Secondary | ICD-10-CM

## 2020-07-04 DIAGNOSIS — K2270 Barrett's esophagus without dysplasia: Secondary | ICD-10-CM

## 2020-07-04 MED ORDER — DEXILANT 60 MG PO CPDR
60.0000 mg | DELAYED_RELEASE_CAPSULE | Freq: Every day | ORAL | 0 refills | Status: DC
Start: 2020-07-04 — End: 2020-11-03

## 2020-07-04 MED ORDER — SUCRALFATE 1 GM/10ML PO SUSP
1.0000 g | Freq: Four times a day (QID) | ORAL | 1 refills | Status: DC | PRN
Start: 2020-07-04 — End: 2021-09-29

## 2020-07-04 NOTE — Telephone Encounter (Signed)
Patient's wife called on his way to the appt. He has not had Sx for 3 to 4 days.

## 2020-07-04 NOTE — Patient Instructions (Addendum)
If you are age 67 or older, your body mass index should be between 23-30. Your Body mass index is 29.74 kg/m. If this is out of the aforementioned range listed, please consider follow up with your Primary Care Provider.  If you are age 35 or younger, your body mass index should be between 19-25. Your Body mass index is 29.74 kg/m. If this is out of the aformentioned range listed, please consider follow up with your Primary Care Provider.   We have sent the following medications to your pharmacy for you to pick up at your convenience: Carafate suspension: Take 10 ml every 6 hours as needed  We have given you samples of the following medication to take: Dexilant 60 UX:NATF once daily  Please call in 2 weeks to let us know how you are doing.  You will be due for an EGD and colonoscopy in 01-2022. We will remind you when it is time to schedule.   Thank you for entrusting me with your care and for choosing [facility], Dr. Arco Cellar

## 2020-07-04 NOTE — Progress Notes (Signed)
HPI :  69 year old male here for a follow-up visit for reflux and short segment Barrett's esophagus.  He has a very short segment of Barrett's esophagus noted on EGD in 2017 and stable on most recent EGD in March 2020.  He has been on Nexium longstanding for his reflux.  He previously was on once daily dosing but was having worsening regurgitation and some pyrosis despite this so he increased his dose to twice daily.  At the time of his EGD in March 2020 he states his symptoms were better controlled on twice daily dosing.  Over time he has continued to take that dose.  Unfortunately he states he has fairly frequent breakthrough of symptoms most days of the week.  Often after he eats he will feel some pyrosis and regurgitation that bothers him.  He denies any nausea and vomiting.  He is otherwise eating well.  He denies any dysphagia.  He denies any abdominal pains. 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 He has been on Reglan in the past with questionable benefit per review of chart.   We discussed options for treatment of reflux.  He has not tried a different PPI in quite some time.  He otherwise had a Covid vaccine but developed Covid earlier this month.  He states it was a mild course and he is recovered, has no symptoms that bother him right now.   Prior workup: EGD 01/29/2016 - 2cm hiatal hernia, irregular z-line, mild gastritis - path c/w BE without dysplasia, stable from last exam, biopsies negative for HP Colonoscopy 12/20/2014 - 2 small adenomas, diverticulosis - recall 12/2019 EGD 01/25/2013 - irregular z-line Gastric emptying study 02/19/2009 - delayed gastric emptying MRI abdomen 10/15/2008 - simple renal cysts.  EGD 01/26/19 -  - The Z-line was irregular and was found 41 cm from the incisors, with a few small islands of salmon colored mucosa extending upwards of 1cm  above the GEJ. Biopsies were taken with a cold forceps for histology. - The exam of the esophagus was otherwise normal. - Patchy mildly erythematous mucosa was found in the gastric body and in the gastric antrum. Biopsies were taken with a cold forceps for Helicobacter pylori testing. - The exam of the stomach was otherwise normal. - The duodenal bulb and second portion of the duodenum were normal.   1. Surgical [P], gastric antrum and gastric body - ANTRAL MUCOSA WITH REACTIVE GASTROPATHY. - OXYNTIC MUCOSA WITH MILD HYPEREMIA. - WARTHIN-STARRY STAIN NEGATIVE FOR HELICOBACTER PYLORI. - NO INTESTINAL METAPLASIA, DYSPLASIA OR MALIGNANCY. 2. Surgical [P], distal esophagus - GASTROESOPHAGEAL MUCOSA WITH INTESTINAL METAPLASIA CONSISTENT WITH BARRETT'S ESOPHAGUS. - NO DYSPLASIA OR MALIGNANCY.  Echo 02/23/20 - EF 60-65%, mod LVH   Past Medical History:  Diagnosis Date  . Barrett's esophagus   . Chronic back pain   . Gastritis    Mild  . Gastroparesis   . GERD (gastroesophageal reflux disease)   . Headache   . Hyperglycemia   . Hypertension   . Nephrolithiasis   . Renal cyst   . Stricture and stenosis of esophagus   . Stroke ([place]) 07-2015  . Vision abnormalities      Past Surgical History:  Procedure Laterality Date  . [place] SURGERY  2012  . COLONOSCOPY    . [place] SURGERY  2005, 2010   replaced L4 and  L5  . UPPER GASTROINTESTINAL ENDOSCOPY     Family History  Problem Relation Age of Onset  . Hypertension Father   . Stroke Father   . Hypertension Mother   . Liver cancer Mother   . Hypertension Sister        2 other sisters has also  . Stroke Sister   . Stroke Sister   . Hypertension Brother        2nd brother has also  . Colon cancer Neg Hx   . Esophageal cancer Neg Hx   . Rectal cancer Neg Hx   . Stomach cancer Neg Hx   . Colon polyps Neg Hx    Social History   Tobacco Use  . Smoking status: Former Smoker    Packs/day: 0.50    Years: 30.00      Pack years: 15.00    Types: Cigarettes    Quit date: 04/15/2018    Years since quitting: 2.2  . Smokeless tobacco: Never Used  Substance Use Topics  . Alcohol use: No    Alcohol/week: 0.0 standard drinks  . Drug use: Never    Comment: last use 04/2018   Current Outpatient Medications  Medication Sig Dispense Refill  . aspirin EC 81 MG tablet Take 1 tablet (81 mg total) by mouth daily. Swallow whole. 30 tablet 11  . esomeprazole (NEXIUM) 40 MG capsule Take 1 capsule (40 mg total) by mouth 2 (two) times daily before a meal. Please keep your August appointment for further refills. 60 capsule 1  . olmesartan (BENICAR) 20 MG tablet Take 20 mg by mouth daily.    Marland Kitchen oxymorphone (OPANA) 10 MG tablet Take 10 mg by mouth in the morning and at bedtime.     . traZODone (DESYREL) 50 MG tablet Take 1 tablet (50 mg total) by mouth at bedtime as needed for sleep. 90 tablet 1   No current facility-administered medications for this visit.   No Known Allergies   Review of Systems: All systems reviewed and negative except where noted in HPI.    Lab Results  Component Value Date   CREATININE 0.96 06/17/2020   BUN 12 06/17/2020   NA 139 06/17/2020   K 3.7 06/17/2020   CL 105 06/17/2020   CO2 25 06/17/2020    Lab Results  Component Value Date   ALT 21 02/28/2020   AST 18 02/28/2020   ALKPHOS 81 02/28/2020   BILITOT 0.8 02/28/2020    Lab Results  Component Value Date   WBC 14.0 (H) 06/23/2020   HGB 13.0 06/23/2020   HCT 38.4 (L) 06/23/2020   MCV 90.1 06/23/2020   PLT 286 06/23/2020     Physical Exam: BP 110/74   Pulse 72   Ht 6\' 2"  (1.88 m)   Wt 231 lb 9.6 oz (105.1 kg)   BMI 29.74 kg/m  Constitutional: Pleasant,well-developed, male in no acute distress. Neurological: Alert and oriented to person place and time. Skin: Skin is warm and dry. No rashes noted. Psychiatric: Normal mood and affect. Behavior is normal.   ASSESSMENT AND PLAN: 69 year old male here for  reassessment of the following:  GERD / Barrett's esophagus - stable short segment of Barrett's esophagus on most recent EGD without worsening.  This is a low risk lesion for progression to cancer but I do agree with continuing PPI use to reduce his risk for malignant transformation.  He previously had benefit with higher dose of Nexium to twice daily dosing, however over time that has  waned and his symptoms are very well controlled right now as outlined above.  I discussed options with him, he has not tried a different form of PPI in quite some time, will give him a trial of Dexilant 60 mg once daily given his refractory symptoms and see if that helps.  I will also give him some liquid Carafate 10 cc every 6 hours as needed.  I asked him to touch base with me in a few weeks to let me know how is doing, if his symptoms persist we will discuss other options.  I did discuss long-term risks benefits of chronic PPI use.  His renal function is normal, he understands risk of CKD with this but hopefully that risk is low.  Otherwise due for another surveillance EGD in March 2023.  He agreed, he will see me once yearly for this issue if not sooner especially if his symptoms remain poorly controlled.  History of colon polyps - we will plan for next surveillance colonoscopy at same time as EGD in 2023.  [place] Cellar, MD [facility]

## 2020-07-04 NOTE — Telephone Encounter (Signed)
Patient was left messages yesterday to call.  At 5:00pm yesterday, Barb Merino, RN left message to call before he comes for appt on Friday.  LM for pt to call BEFORE coming to his appt today.

## 2020-07-07 ENCOUNTER — Other Ambulatory Visit: Payer: Self-pay | Admitting: Family Medicine

## 2020-08-05 ENCOUNTER — Other Ambulatory Visit: Payer: Self-pay | Admitting: Physical Medicine and Rehabilitation

## 2020-08-05 DIAGNOSIS — G4700 Insomnia, unspecified: Secondary | ICD-10-CM

## 2020-08-05 NOTE — Patient Instructions (Addendum)
DUE TO COVID-19 ONLY ONE VISITOR IS ALLOWED TO COME WITH YOU AND STAY IN THE WAITING ROOM ONLY  DURING PRE OP AND PROCEDURE.   IF YOU WILL BE ADMITTED INTO THE HOSPITAL YOU ARE ALLOWED ONE SUPPORT PERSON DURING VISITATION  HOURS ONLY (10AM -8PM)   . The support person may change daily. . The support person must pass our screening, gel in and out, and wear a mask at all times, including in the patient's room. . Patients must also wear a mask when staff or their support person are in the room.   COVID SWAB TESTING MUST BE COMPLETED ON:  Monday, 08-11-20 @ 10:30 AM   [address] [place], [place] [address]  (Must self quarantine after testing. Follow instructions on  handout.)        Your procedure is scheduled on:  Thursday, 08-14-20   Report to [facility] Main  Entrance   Report to admitting at 10:35 AM   Call this number if you have problems the morning of surgery [phone]   Do not eat food :After Midnight.   May have liquids until 9:30 AM day of surgery   CLEAR LIQUID DIET  Foods Allowed                                                                     Foods Excluded  Water, Black Coffee and tea, regular and decaf           liquids that you cannot  Plain Jell-O in any flavor  (No red)                                  see through such as: Fruit ices (not with fruit pulp)                                      milk, soups, orange juice              Iced Popsicles (No red)                                      All solid food                                   Apple juices Sports drinks like Gatorade (No red) Lightly seasoned clear broth or consume(fat free) Sugar, honey syrup     Complete one Ensure drink the morning of surgery at  9:30 AM  the day of surgery.     Oral Hygiene is also important to reduce your risk of infection.                                    Remember - BRUSH YOUR TEETH THE MORNING OF SURGERY WITH YOUR REGULAR TOOTHPASTE   Do NOT smoke after  Midnight   Take these medicines the  morning of surgery with A SIP OF WATER: Dexilant, Nexium                                You may not have any metal on your body including jewelry, and body piercings             Do not wear lotions, powders, perfumes/cologne, or deodorant            Men may shave face and neck.   Do not bring valuables to the hospital. [place].   Contacts, dentures or bridgework may not be worn into surgery.     Patients discharged the day of surgery will not be allowed to drive home.                Please read over the following fact sheets you were given: IF YOU HAVE QUESTIONS ABOUT YOUR PRE OP INSTRUCTIONS PLEASE CALL [phone]   [facility] - Preparing for Surgery Before surgery, you can play an important role.  Because skin is not sterile, your skin needs to be as free of germs as possible.  You can reduce the number of germs on your skin by washing with CHG (chlorahexidine gluconate) soap before surgery.  CHG is an antiseptic cleaner which kills germs and bonds with the skin to continue killing germs even after washing. Please DO NOT use if you have an allergy to CHG or antibacterial soaps.  If your skin becomes reddened/irritated stop using the CHG and inform your nurse when you arrive at Short Stay. Do not shave (including legs and underarms) for at least 48 hours prior to the first CHG shower.  You may shave your face/neck.  Please follow these instructions carefully:  1.  Shower with CHG Soap the night before surgery and the  morning of surgery.  2.  If you choose to wash your hair, wash your hair first as usual with your normal  shampoo.  3.  After you shampoo, rinse your hair and body thoroughly to remove the shampoo.                             4.  Use CHG as you would any other liquid soap.  You can apply chg directly to the skin and wash.  Gently with a scrungie or clean washcloth.  5.  Apply the CHG Soap to  your body ONLY FROM THE NECK DOWN.   Do   not use on face/ open                           Wound or open sores. Avoid contact with eyes, ears mouth and   genitals (private parts).                       Wash face,  Genitals (private parts) with your normal soap.             6.  Wash thoroughly, paying special attention to the area where your    surgery  will be performed.  7.  Thoroughly rinse your body with warm water from the neck down.  8.  DO NOT shower/wash with your normal soap after using and rinsing off the CHG Soap.  9.  Pat yourself dry with a clean towel.            10.  Wear clean pajamas.            11.  Place clean sheets on your bed the night of your first shower and do not  sleep with pets. Day of Surgery : Do not apply any lotions/deodorants the morning of surgery.  Please wear clean clothes to the hospital/surgery center.  FAILURE TO FOLLOW THESE INSTRUCTIONS MAY RESULT IN THE CANCELLATION OF YOUR SURGERY  PATIENT SIGNATURE_________________________________  NURSE SIGNATURE__________________________________  ________________________________________________________________________   Richard Vaughn  An incentive spirometer is a tool that can help keep your lungs clear and active. This tool measures how well you are filling your lungs with each breath. Taking long deep breaths may help reverse or decrease the chance of developing breathing (pulmonary) problems (especially infection) following:  A long period of time when you are unable to move or be active. BEFORE THE PROCEDURE   If the spirometer includes an indicator to show your best effort, your nurse or respiratory therapist will set it to a desired goal.  If possible, sit up straight or lean slightly forward. Try not to slouch.  Hold the incentive spirometer in an upright position. INSTRUCTIONS FOR USE  1. Sit on the edge of your bed if possible, or sit up as far as you can in bed or on a  chair. 2. Hold the incentive spirometer in an upright position. 3. Breathe out normally. 4. Place the mouthpiece in your mouth and seal your lips tightly around it. 5. Breathe in slowly and as deeply as possible, raising the piston or the ball toward the top of the column. 6. Hold your breath for 3-5 seconds or for as long as possible. Allow the piston or ball to fall to the bottom of the column. 7. Remove the mouthpiece from your mouth and breathe out normally. 8. Rest for a few seconds and repeat Steps 1 through 7 at least 10 times every 1-2 hours when you are awake. Take your time and take a few normal breaths between deep breaths. 9. The spirometer may include an indicator to show your best effort. Use the indicator as a goal to work toward during each repetition. 10. After each set of 10 deep breaths, practice coughing to be sure your lungs are clear. If you have an incision (the cut made at the time of surgery), support your incision when coughing by placing a pillow or rolled up towels firmly against it. Once you are able to get out of bed, walk around indoors and cough well. You may stop using the incentive spirometer when instructed by your caregiver.  RISKS AND COMPLICATIONS  Take your time so you do not get dizzy or light-headed.  If you are in pain, you may need to take or ask for pain medication before doing incentive spirometry. It is harder to take a deep breath if you are having pain. AFTER USE  Rest and breathe slowly and easily.  It can be helpful to keep track of a log of your progress. Your caregiver can provide you with a simple table to help with this. If you are using the spirometer at home, follow these instructions: [place] IF:   You are having difficultly using the spirometer.  You have trouble using the spirometer as often as instructed.  Your pain medication is not giving enough relief while using the spirometer.  You  develop fever of 100.5 F  (38.1 C) or higher. SEEK IMMEDIATE MEDICAL CARE IF:   You cough up bloody sputum that had not been present before.  You develop fever of 102 F (38.9 C) or greater.  You develop worsening pain at or near the incision site. MAKE SURE YOU:   Understand these instructions.  Will watch your condition.  Will get help right away if you are not doing well or get worse. Document Released: 03/14/2007 Document Revised: 01/24/2012 Document Reviewed: 05/15/2007 [facility] Patient Information 2014 [place], [place].   ________________________________________________________________________

## 2020-08-05 NOTE — Progress Notes (Addendum)
COVID Vaccine Completed: x2 Date COVID Vaccine completed: 01-05-20 & 01-29-20 COVID vaccine manufacturer: [place]   PCP - Betty Martinique, MD Cardiologist -   Cleared for surgery by neurology per phone note dated 06-10-20.  Chest x-ray - 06-23-20 in Epic EKG - 02-26-20 in Epic Stress Test -  ECHO - 02-23-20 in Epic Cardiac Cath -  Pacemaker/ICD device last checked:  Sleep Study - 07-20-18 in Epic CPAP - No due to mask discomfort.  Fasting Blood Sugar -  Checks Blood Sugar _____ times a day  Blood Thinner Instructions: Aspirin Instructions: 81 mg.  Pt to stay on for surgery Last Dose:  Anesthesia review: CVD, CVA 02-2020, sleep apnea.  Recent Covid pneumonia 06-2020.  Left sided weakness and difficulty swallowing.  Pt not sure if he aspirates but does cough after drinking liquids.    Patient denies shortness of breath, fever, cough and chest pain at PAT appointment   Patient verbalized understanding of instructions that were given to them at the PAT appointment. Patient was also instructed that they will need to review over the PAT instructions again at home before surgery.

## 2020-08-07 ENCOUNTER — Other Ambulatory Visit: Payer: Self-pay

## 2020-08-07 ENCOUNTER — Encounter (HOSPITAL_COMMUNITY): Payer: Self-pay

## 2020-08-07 ENCOUNTER — Encounter (HOSPITAL_COMMUNITY)
Admission: RE | Admit: 2020-08-07 | Discharge: 2020-08-07 | Disposition: A | Discharge status: Home / Self Care | Payer: Medicare Other | Source: Ambulatory Visit | Attending: Orthopedic Surgery | Admitting: Orthopedic Surgery

## 2020-08-07 DIAGNOSIS — Z01812 Encounter for preprocedural laboratory examination: Secondary | ICD-10-CM | POA: Insufficient documentation

## 2020-08-07 HISTORY — DX: Pneumonia, unspecified organism: J18.9

## 2020-08-07 HISTORY — DX: Personal history of urinary calculi: Z87.442

## 2020-08-07 LAB — CBC
HCT: 41.3 % (ref 39.0–52.0)
Hemoglobin: 13.8 g/dL (ref 13.0–17.0)
MCH: 30.7 pg (ref 26.0–34.0)
MCHC: 33.4 g/dL (ref 30.0–36.0)
MCV: 91.8 fL (ref 80.0–100.0)
Platelets: 161 10*3/uL (ref 150–400)
RBC: 4.5 MIL/uL (ref 4.22–5.81)
RDW: 12.9 % (ref 11.5–15.5)
WBC: 5.1 10*3/uL (ref 4.0–10.5)
nRBC: 0 % (ref 0.0–0.2)

## 2020-08-07 LAB — BASIC METABOLIC PANEL
Anion gap: 7 (ref 5–15)
BUN: 12 mg/dL (ref 8–23)
CO2: 27 mmol/L (ref 22–32)
Calcium: 9.1 mg/dL (ref 8.9–10.3)
Chloride: 107 mmol/L (ref 98–111)
Creatinine, Ser: 0.89 mg/dL (ref 0.61–1.24)
GFR calc Af Amer: >60 mL/min (ref >60)
GFR calc non Af Amer: >60 mL/min (ref >60)
Glucose, Bld: 104 mg/dL — ABNORMAL HIGH (ref 70–99)
Potassium: 4 mmol/L (ref 3.5–5.1)
Sodium: 141 mmol/L (ref 135–145)

## 2020-08-07 NOTE — Anesthesia Preprocedure Evaluation (Addendum)
Anesthesia Evaluation  Patient identified by MRN, date of birth, ID band Patient awake    Reviewed: Allergy & Precautions, NPO status , Patient's Chart, lab work & pertinent test results  Airway Mallampati: I  TM Distance: >3 FB Neck ROM: Full    Dental  (+) Missing, Poor Dentition, Chipped, Dental Advisory Given   Pulmonary sleep apnea , former smoker,    Pulmonary exam normal breath sounds clear to auscultation       Cardiovascular hypertension, Pt. on medications Normal cardiovascular exam Rhythm:Regular Rate:Normal     Neuro/Psych Chronic narcotic use CVA, Residual Symptoms negative psych ROS   GI/Hepatic Neg liver ROS, GERD  ,Difficulty swallowing at times post CVA   Endo/Other  negative endocrine ROS  Renal/GU negative Renal ROS  negative genitourinary   Musculoskeletal negative musculoskeletal ROS (+)   Abdominal   Peds negative pediatric ROS (+)  Hematology negative hematology ROS (+)   Anesthesia Other Findings   Reproductive/Obstetrics negative OB ROS                          Anesthesia Physical Anesthesia Plan  ASA: III  Anesthesia Plan: General   Post-op Pain Management:  Regional for Post-op pain   Induction: Intravenous and Rapid sequence  PONV Risk Score and Plan: 2 and Ondansetron, Dexamethasone and Treatment may vary due to age or medical condition  Airway Management Planned: Oral ETT  Additional Equipment:   Intra-op Plan:   Post-operative Plan: Extubation in OR  Informed Consent: I have reviewed the patients History and Physical, chart, labs and discussed the procedure including the risks, benefits and alternatives for the proposed anesthesia with the patient or authorized representative who has indicated his/her understanding and acceptance.     Dental advisory given  Plan Discussed with: CRNA and Surgeon  Anesthesia Plan Comments: (See PAT note  08/07/2020, Konrad Felix, PA-C)      Anesthesia Quick Evaluation

## 2020-08-07 NOTE — Progress Notes (Signed)
Anesthesia Chart Review   Case: [id number] Date/Time: 08/14/20 1220   Procedure: SHOULDER ARTHROSCOPY WITH ROTATOR CUFF REPAIR, SUBACROMIAL DECOMPRESSION (Left )   Anesthesia type: Choice   Pre-op diagnosis: LEFT SHOULDER ROTATOR CUFF TEAR   Location: [facility] ROOM 09 / [facility]   Surgeons: Tania Ade, MD      DISCUSSION:69 y.o. 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 Tania Ade.   Pt s/p COVID dx 06/23/2020, sx resolved.   Last seen by neurology 05/26/20.  Per OV note, "-Currently 3 months post stroke not currently on antithrombotic -After CT head completed, if stable, will further discuss clearance with Dr. Leonie Man from a stroke standpoint especially as torn rotator cuff complicating further stroke recovery"  CT Heat 06/04/20 with resolution of prior hemorrhage.  Per Dr. Leonie Man, "ok for neurological clearance for rotator cuff surgery."  Since CVA pt has been experiencing difficulty with swallowing.  Coughing with meals, solids more than liquids.  Participated in speech therapy, given aspiration precautions.   Anticipate pt can proceed with planned procedure barring acute status change.   VS: BP 120/80   Pulse 89   Temp 36.9 C (Oral)   Resp 16   Ht 6\' 2"  (1.88 m)   Wt 105.8 kg   SpO2 99%   BMI 29.94 kg/m   PROVIDERS: [place], [name], MD is PCP   Antony Contras, MD is Neurologist  LABS: Labs reviewed: Acceptable for surgery. (all labs ordered are listed, but only abnormal results are displayed)  Labs Reviewed  BASIC METABOLIC PANEL - Abnormal; Notable for the following components:      Result Value   Glucose, Bld 104 (*)    All other components within normal limits  CBC     IMAGES:   EKG: 02/26/2020 Rate 64 bpm  Sinus rhythm  Low voltage, precordial leads   CV: Echo 02/23/2020 IMPRESSIONS    1. Left ventricular  ejection fraction, by estimation, is 60 to 65%. The  left ventricle has normal function. The left ventricle has no regional  wall motion abnormalities. There is moderate left ventricular hypertrophy  of the basal-septal segment. Left  ventricular diastolic parameters were normal.  2. Right ventricular systolic function is normal. The right ventricular  size is normal. Tricuspid regurgitation signal is inadequate for assessing  PA pressure.  3. The mitral valve is normal in structure. Trivial mitral valve  regurgitation. No evidence of mitral stenosis.  4. The aortic valve was not well visualized. Aortic valve regurgitation  is not visualized. Mild aortic valve sclerosis is present, with no  evidence of aortic valve stenosis.  5. The inferior vena cava is dilated in size with >50% respiratory  variability, suggesting right atrial pressure of 8 mmHg. Past Medical History:  Diagnosis Date  . Barrett's esophagus   . Chronic back pain   . Depression   . Gastritis    Mild  . Gastroparesis   . GERD (gastroesophageal reflux disease)   . Headache   . History of kidney stones   . Hyperglycemia   . Hypertension   . Nephrolithiasis   . Pneumonia    Covid pneumonia  . Renal cyst   . Stricture and stenosis of esophagus   . Stroke ([place]) 07-2015  . Vision abnormalities     Past Surgical History:  Procedure Laterality Date  . [place] SURGERY  2012  .  COLONOSCOPY    . [place] SURGERY  2005, 2010   replaced L4 and L5  . UPPER GASTROINTESTINAL ENDOSCOPY      MEDICATIONS: . aspirin EC 81 MG tablet  . dexlansoprazole (DEXILANT) 60 MG capsule  . esomeprazole (NEXIUM) 40 MG capsule  . olmesartan (BENICAR) 20 MG tablet  . oxymorphone (OPANA) 10 MG tablet  . sucralfate (CARAFATE) 1 GM/10ML suspension  . traZODone (DESYREL) 50 MG tablet   No current facility-administered medications for this encounter.    Konrad Felix, PA-C [facility] [phone]

## 2020-08-09 ENCOUNTER — Other Ambulatory Visit: Payer: Self-pay | Admitting: Physical Medicine and Rehabilitation

## 2020-08-09 DIAGNOSIS — G4700 Insomnia, unspecified: Secondary | ICD-10-CM

## 2020-08-11 ENCOUNTER — Other Ambulatory Visit (HOSPITAL_COMMUNITY)
Admission: RE | Admit: 2020-08-11 | Discharge: 2020-08-11 | Disposition: A | Discharge status: Home / Self Care | Payer: Medicare Other | Source: Ambulatory Visit | Attending: Obstetrics & Gynecology | Admitting: Obstetrics & Gynecology

## 2020-08-11 DIAGNOSIS — Z01812 Encounter for preprocedural laboratory examination: Secondary | ICD-10-CM | POA: Insufficient documentation

## 2020-08-11 DIAGNOSIS — Z20822 Contact with and (suspected) exposure to covid-19: Secondary | ICD-10-CM | POA: Insufficient documentation

## 2020-08-11 LAB — SARS CORONAVIRUS 2 (TAT 6-24 HRS): SARS Coronavirus 2: NEGATIVE

## 2020-08-12 ENCOUNTER — Other Ambulatory Visit: Payer: Self-pay | Admitting: Family Medicine

## 2020-08-12 DIAGNOSIS — I674 Hypertensive encephalopathy: Secondary | ICD-10-CM

## 2020-08-14 ENCOUNTER — Ambulatory Visit (HOSPITAL_COMMUNITY)
Admission: RE | Admit: 2020-08-14 | Discharge: 2020-08-14 | Disposition: A | Discharge status: Home / Self Care | Payer: Medicare Other | Attending: Orthopedic Surgery | Admitting: Orthopedic Surgery

## 2020-08-14 ENCOUNTER — Ambulatory Visit (HOSPITAL_COMMUNITY): Payer: Medicare Other | Admitting: Certified Registered"

## 2020-08-14 ENCOUNTER — Other Ambulatory Visit: Payer: Self-pay

## 2020-08-14 ENCOUNTER — Encounter (HOSPITAL_COMMUNITY)
Admission: RE | Disposition: A | Discharge status: Home / Self Care | Payer: Self-pay | Source: Home / Self Care | Attending: Orthopedic Surgery

## 2020-08-14 ENCOUNTER — Encounter (HOSPITAL_COMMUNITY): Payer: Self-pay | Admitting: Orthopedic Surgery

## 2020-08-14 ENCOUNTER — Ambulatory Visit (HOSPITAL_COMMUNITY): Payer: Medicare Other | Admitting: Physician Assistant

## 2020-08-14 DIAGNOSIS — M75102 Unspecified rotator cuff tear or rupture of left shoulder, not specified as traumatic: Secondary | ICD-10-CM | POA: Diagnosis not present

## 2020-08-14 DIAGNOSIS — Z8616 Personal history of COVID-19: Secondary | ICD-10-CM | POA: Diagnosis not present

## 2020-08-14 DIAGNOSIS — M7542 Impingement syndrome of left shoulder: Secondary | ICD-10-CM | POA: Diagnosis not present

## 2020-08-14 DIAGNOSIS — Z8719 Personal history of other diseases of the digestive system: Secondary | ICD-10-CM | POA: Diagnosis not present

## 2020-08-14 DIAGNOSIS — I10 Essential (primary) hypertension: Secondary | ICD-10-CM | POA: Insufficient documentation

## 2020-08-14 DIAGNOSIS — K219 Gastro-esophageal reflux disease without esophagitis: Secondary | ICD-10-CM | POA: Diagnosis not present

## 2020-08-14 DIAGNOSIS — Z87891 Personal history of nicotine dependence: Secondary | ICD-10-CM | POA: Insufficient documentation

## 2020-08-14 DIAGNOSIS — F329 Major depressive disorder, single episode, unspecified: Secondary | ICD-10-CM | POA: Diagnosis not present

## 2020-08-14 DIAGNOSIS — Z8673 Personal history of transient ischemic attack (TIA), and cerebral infarction without residual deficits: Secondary | ICD-10-CM | POA: Diagnosis not present

## 2020-08-14 DIAGNOSIS — G8918 Other acute postprocedural pain: Secondary | ICD-10-CM | POA: Diagnosis not present

## 2020-08-14 DIAGNOSIS — Z7982 Long term (current) use of aspirin: Secondary | ICD-10-CM | POA: Diagnosis not present

## 2020-08-14 DIAGNOSIS — Z79899 Other long term (current) drug therapy: Secondary | ICD-10-CM | POA: Insufficient documentation

## 2020-08-14 DIAGNOSIS — Z8249 Family history of ischemic heart disease and other diseases of the circulatory system: Secondary | ICD-10-CM | POA: Diagnosis not present

## 2020-08-14 DIAGNOSIS — Z823 Family history of stroke: Secondary | ICD-10-CM | POA: Diagnosis not present

## 2020-08-14 HISTORY — PX: SHOULDER ARTHROSCOPY WITH ROTATOR CUFF REPAIR: SHX5685

## 2020-08-14 SURGERY — ARTHROSCOPY, SHOULDER, WITH ROTATOR CUFF REPAIR
Anesthesia: General | Laterality: Left

## 2020-08-14 MED ORDER — SUCCINYLCHOLINE CHLORIDE 200 MG/10ML IV SOSY
PREFILLED_SYRINGE | INTRAVENOUS | Status: DC | PRN
  Administered 2020-08-14: 100 mg via INTRAVENOUS

## 2020-08-14 MED ORDER — MIDAZOLAM HCL 2 MG/2ML IJ SOLN
INTRAMUSCULAR | Status: AC
  Administered 2020-08-14: 1 mg via INTRAVENOUS
  Filled 2020-08-14: qty 2

## 2020-08-14 MED ORDER — BUPIVACAINE LIPOSOME 1.3 % IJ SUSP
INTRAMUSCULAR | Status: DC | PRN
  Administered 2020-08-14: 10 mL via PERINEURAL

## 2020-08-14 MED ORDER — BUPIVACAINE HCL (PF) 0.5 % IJ SOLN
INTRAMUSCULAR | Status: DC | PRN
  Administered 2020-08-14: 15 mL via PERINEURAL

## 2020-08-14 MED ORDER — TIZANIDINE HCL 4 MG PO TABS: 4.0000 mg | ORAL_TABLET | Freq: Three times a day (TID) | ORAL | 1 refills | Status: DC | PRN

## 2020-08-14 MED ORDER — PROPOFOL 10 MG/ML IV BOLUS
INTRAVENOUS | Status: DC | PRN
  Administered 2020-08-14: 150 mg via INTRAVENOUS

## 2020-08-14 MED ORDER — FENTANYL CITRATE (PF) 100 MCG/2ML IJ SOLN
INTRAMUSCULAR | Status: AC
  Administered 2020-08-14: 50 ug via INTRAVENOUS
  Filled 2020-08-14: qty 2

## 2020-08-14 MED ORDER — LACTATED RINGERS IV SOLN: INTRAVENOUS | Status: DC

## 2020-08-14 MED ORDER — SODIUM CHLORIDE 0.9 % IR SOLN
Status: DC | PRN
  Administered 2020-08-14: 3000 mL

## 2020-08-14 MED ORDER — ROCURONIUM BROMIDE 10 MG/ML (PF) SYRINGE
PREFILLED_SYRINGE | INTRAVENOUS | Status: DC | PRN
  Administered 2020-08-14: 30 mg via INTRAVENOUS
  Administered 2020-08-14: 20 mg via INTRAVENOUS
  Administered 2020-08-14: 30 mg via INTRAVENOUS

## 2020-08-14 MED ORDER — DEXAMETHASONE SODIUM PHOSPHATE 10 MG/ML IJ SOLN
INTRAMUSCULAR | Status: DC | PRN
  Administered 2020-08-14: 8 mg via INTRAVENOUS

## 2020-08-14 MED ORDER — DEXAMETHASONE SODIUM PHOSPHATE 10 MG/ML IJ SOLN
INTRAMUSCULAR | Status: AC
  Filled 2020-08-14: qty 1

## 2020-08-14 MED ORDER — ROCURONIUM BROMIDE 10 MG/ML (PF) SYRINGE
PREFILLED_SYRINGE | INTRAVENOUS | Status: AC
  Filled 2020-08-14: qty 10

## 2020-08-14 MED ORDER — ONDANSETRON HCL 4 MG/2ML IJ SOLN
INTRAMUSCULAR | Status: DC | PRN
  Administered 2020-08-14: 4 mg via INTRAVENOUS

## 2020-08-14 MED ORDER — CEFAZOLIN SODIUM-DEXTROSE 2-4 GM/100ML-% IV SOLN
2.0000 g | INTRAVENOUS | Status: AC
  Administered 2020-08-14: 2 g via INTRAVENOUS
  Filled 2020-08-14: qty 100

## 2020-08-14 MED ORDER — PROMETHAZINE HCL 25 MG/ML IJ SOLN: 6.2500 mg | INTRAMUSCULAR | Status: DC | PRN

## 2020-08-14 MED ORDER — LIDOCAINE 2% (20 MG/ML) 5 ML SYRINGE
INTRAMUSCULAR | Status: AC
  Filled 2020-08-14: qty 5

## 2020-08-14 MED ORDER — PHENYLEPHRINE HCL-NACL 10-0.9 MG/250ML-% IV SOLN
INTRAVENOUS | Status: DC | PRN
  Administered 2020-08-14: 25 ug/min via INTRAVENOUS

## 2020-08-14 MED ORDER — 0.9 % SODIUM CHLORIDE (POUR BTL) OPTIME
TOPICAL | Status: DC | PRN
  Administered 2020-08-14: 1000 mL

## 2020-08-14 MED ORDER — LACTATED RINGERS IR SOLN
Status: DC | PRN
  Administered 2020-08-14: 3000 mL

## 2020-08-14 MED ORDER — FENTANYL CITRATE (PF) 100 MCG/2ML IJ SOLN
50.0000 ug | INTRAMUSCULAR | Status: DC
  Filled 2020-08-14: qty 2

## 2020-08-14 MED ORDER — FENTANYL CITRATE (PF) 250 MCG/5ML IJ SOLN
INTRAMUSCULAR | Status: DC | PRN
Start: 2020-08-14 — End: 2020-08-14
  Administered 2020-08-14: 50 ug via INTRAVENOUS

## 2020-08-14 MED ORDER — ORAL CARE MOUTH RINSE
15.0000 mL | Freq: Once | OROMUCOSAL | Status: AC
  Administered 2020-08-14: 15 mL via OROMUCOSAL

## 2020-08-14 MED ORDER — FENTANYL CITRATE (PF) 100 MCG/2ML IJ SOLN
INTRAMUSCULAR | Status: AC
  Filled 2020-08-14: qty 2

## 2020-08-14 MED ORDER — HYDROMORPHONE HCL 1 MG/ML IJ SOLN: 0.2500 mg | INTRAMUSCULAR | Status: DC | PRN

## 2020-08-14 MED ORDER — LIDOCAINE 2% (20 MG/ML) 5 ML SYRINGE
INTRAMUSCULAR | Status: DC | PRN
  Administered 2020-08-14: 40 mg via INTRAVENOUS

## 2020-08-14 MED ORDER — CHLORHEXIDINE GLUCONATE 0.12 % MT SOLN: 15.0000 mL | Freq: Once | OROMUCOSAL | Status: AC

## 2020-08-14 MED ORDER — MIDAZOLAM HCL 2 MG/2ML IJ SOLN
1.0000 mg | INTRAMUSCULAR | Status: DC
  Filled 2020-08-14: qty 2

## 2020-08-14 MED ORDER — OXYCODONE-ACETAMINOPHEN 5-325 MG PO TABS: ORAL_TABLET | ORAL | 0 refills | Status: DC

## 2020-08-14 MED ORDER — ONDANSETRON HCL 4 MG/2ML IJ SOLN
INTRAMUSCULAR | Status: AC
  Filled 2020-08-14: qty 2

## 2020-08-14 MED ORDER — SUCCINYLCHOLINE CHLORIDE 200 MG/10ML IV SOSY
PREFILLED_SYRINGE | INTRAVENOUS | Status: AC
  Filled 2020-08-14: qty 10

## 2020-08-14 MED ORDER — PROPOFOL 10 MG/ML IV BOLUS
INTRAVENOUS | Status: AC
  Filled 2020-08-14: qty 20

## 2020-08-14 SURGICAL SUPPLY — 66 items
AID PSTN UNV HD RSTRNT DISP (MISCELLANEOUS)
ANCH SUT SWLK 19.1X4.75 VT (Anchor) ×4 IMPLANT
ANCHOR PEEK 4.75X19.1 SWLK C (Anchor) ×4 IMPLANT
BOOTIES KNEE HIGH SLOAN (MISCELLANEOUS) ×4 IMPLANT
BURR OVAL 8 FLU 4.0X13 (MISCELLANEOUS) ×2 IMPLANT
CANNULA 5.75X7 CRYSTAL CLEAR (CANNULA) ×2 IMPLANT
CANNULA TWIST IN 8.25X7CM (CANNULA) IMPLANT
COOLER ICEMAN CLASSIC (MISCELLANEOUS) IMPLANT
COVER SURGICAL LIGHT HANDLE (MISCELLANEOUS) ×2 IMPLANT
COVER WAND RF STERILE (DRAPES) IMPLANT
CUTTER BONE 4.0MM X 13CM (MISCELLANEOUS) ×2 IMPLANT
DISSECTOR  3.8MM X 13CM (MISCELLANEOUS)
DISSECTOR 3.8MM X 13CM (MISCELLANEOUS) IMPLANT
DRAPE IMP U-DRAPE 54X76 (DRAPES) ×2 IMPLANT
DRAPE INCISE IOBAN 66X45 STRL (DRAPES) IMPLANT
DRAPE ORTHO SPLIT 77X108 STRL (DRAPES) ×4
DRAPE STERI 35X30 U-POUCH (DRAPES) ×2 IMPLANT
DRAPE SURG ORHT 6 SPLT 77X108 (DRAPES) ×2 IMPLANT
DRAPE U-SHAPE 47X51 STRL (DRAPES) ×4 IMPLANT
DRSG PAD ABDOMINAL 8X10 ST (GAUZE/BANDAGES/DRESSINGS) ×5 IMPLANT
DURAPREP 26ML APPLICATOR (WOUND CARE) ×2 IMPLANT
ELECT REM PT RETURN 15FT ADLT (MISCELLANEOUS) ×2 IMPLANT
GAUZE SPONGE 4X4 12PLY STRL (GAUZE/BANDAGES/DRESSINGS) ×2 IMPLANT
GAUZE XEROFORM 1X8 LF (GAUZE/BANDAGES/DRESSINGS) ×2 IMPLANT
GLOVE BIO SURGEON STRL SZ7 (GLOVE) ×2 IMPLANT
GLOVE BIO SURGEON STRL SZ7.5 (GLOVE) ×2 IMPLANT
GLOVE BIOGEL PI IND STRL 7.0 (GLOVE) ×1 IMPLANT
GLOVE BIOGEL PI IND STRL 8 (GLOVE) ×1 IMPLANT
GLOVE BIOGEL PI INDICATOR 7.0 (GLOVE) ×1
GLOVE BIOGEL PI INDICATOR 8 (GLOVE) ×1
GOWN STRL REUS W/TWL LRG LVL3 (GOWN DISPOSABLE) ×2 IMPLANT
GOWN STRL REUS W/TWL XL LVL3 (GOWN DISPOSABLE) ×2 IMPLANT
KIT BASIN OR (CUSTOM PROCEDURE TRAY) ×2 IMPLANT
KIT PUSHLOCK 2.9 HIP (KITS) IMPLANT
KIT STR SPEAR 1.8 FBRTK DISP (KITS) IMPLANT
KIT TURNOVER KIT A (KITS) IMPLANT
LASSO 90 CVE QUICKPAS (DISPOSABLE) IMPLANT
LASSO CRESCENT QUICKPASS (SUTURE) IMPLANT
MANIFOLD NEPTUNE II (INSTRUMENTS) ×2 IMPLANT
NDL HYPO 25X1 1.5 SAFETY (NEEDLE) IMPLANT
NDL SCORPION MULTI FIRE (NEEDLE) IMPLANT
NEEDLE HYPO 25X1 1.5 SAFETY (NEEDLE) IMPLANT
NEEDLE SCORPION MULTI FIRE (NEEDLE) IMPLANT
PACK ARTHROSCOPY WL (CUSTOM PROCEDURE TRAY) ×2 IMPLANT
PAD COLD SHLDR WRAP-ON (PAD) IMPLANT
PENCIL SMOKE EVACUATOR (MISCELLANEOUS) IMPLANT
PROBE BIPOLAR ATHRO 135MM 90D (MISCELLANEOUS) ×2 IMPLANT
PROTECTOR NERVE ULNAR (MISCELLANEOUS) ×2 IMPLANT
RESTRAINT HEAD UNIVERSAL NS (MISCELLANEOUS) IMPLANT
SLING ARM FOAM STRAP LRG (SOFTGOODS) IMPLANT
SLING ARM FOAM STRAP MED (SOFTGOODS) IMPLANT
SLING ARM IMMOBILIZER LRG (SOFTGOODS) IMPLANT
SLING ARM IMMOBILIZER MED (SOFTGOODS) IMPLANT
SUPPORT WRAP ARM LG (MISCELLANEOUS) ×2 IMPLANT
SUT ETHILON 3 0 PS 1 (SUTURE) ×2 IMPLANT
SUT PDS AB 1 CT1 27 (SUTURE) IMPLANT
SUT TIGER TAPE 7 IN WHITE (SUTURE) IMPLANT
SUTURE TAPE 1.3 40 TPR END (SUTURE) IMPLANT
SUTURETAPE 1.3 40 TPR END (SUTURE)
TAPE FIBER 2MM 7IN #2 BLUE (SUTURE) IMPLANT
TAPE LABRALWHITE 1.5X36 (TAPE) IMPLANT
TAPE SUT LABRALTAP WHT/BLK (SUTURE) IMPLANT
TOWEL OR 17X26 10 PK STRL BLUE (TOWEL DISPOSABLE) ×2 IMPLANT
TOWEL OR NON WOVEN STRL DISP B (DISPOSABLE) ×2 IMPLANT
TUBING ARTHROSCOPY IRRIG 16FT (MISCELLANEOUS) ×2 IMPLANT
TUBING CONNECTING 10 (TUBING) ×4 IMPLANT

## 2020-08-14 NOTE — Anesthesia Procedure Notes (Signed)
Anesthesia Regional Block: Interscalene brachial plexus block   Pre-Anesthetic Checklist: ,, timeout performed, Correct Patient, Correct Site, Correct Laterality, Correct Procedure, Correct Position, site marked, Risks and benefits discussed,  Surgical consent,  Pre-op evaluation,  At surgeon's request and post-op pain management  Laterality: Left  Prep: chloraprep       Needles:  Injection technique: Single-shot  Needle Type: Echogenic Needle     Needle Length: 9cm      Additional Needles:   Procedures:,,,, ultrasound used (permanent image in chart),,,,  Narrative:  Start time: 08/14/2020 11:36 AM End time: 08/14/2020 11:43 AM Injection made incrementally with aspirations every 5 mL.  Performed by: Personally  Anesthesiologist: Myrtie Soman, MD  Additional Notes: Patient tolerated the procedure well without complications

## 2020-08-14 NOTE — Transfer of Care (Signed)
Immediate Anesthesia Transfer of Care Note  Patient: Richard Vaughn  Procedure(s) Performed: SHOULDER ARTHROSCOPY WITH ROTATOR CUFF REPAIR, SUBACROMIAL DECOMPRESSION (Left )  Patient Location: PACU  Anesthesia Type:General  Level of Consciousness: awake and patient cooperative  Airway & Oxygen Therapy: Patient Spontanous Breathing and Patient connected to face mask oxygen  Post-op Assessment: Report given to RN and Post -op Vital signs reviewed and stable  Post vital signs: Reviewed and stable  Last Vitals:  Vitals Value Taken Time  BP 144/96 08/14/20 1330  Temp    Pulse 65 08/14/20 1332  Resp 12 08/14/20 1332  SpO2 100 % 08/14/20 1332  Vitals shown include unvalidated device data.  Last Pain:  Vitals:   08/14/20 1047  TempSrc:   PainSc: 0-No pain         Complications: No complications documented.

## 2020-08-14 NOTE — Anesthesia Procedure Notes (Addendum)
Procedure Name: Intubation Date/Time: 08/14/2020 12:06 PM Performed by: Eben Burow, CRNA Pre-anesthesia Checklist: Patient identified, Emergency Drugs available, Suction available and Patient being monitored Patient Re-evaluated:Patient Re-evaluated prior to induction Oxygen Delivery Method: Circle system utilized Preoxygenation: Pre-oxygenation with 100% oxygen Induction Type: IV induction and Rapid sequence Laryngoscope Size: Mac and 4 Grade View: Grade I Tube type: Oral Tube size: 7.5 mm Number of attempts: 1 Airway Equipment and Method: Stylet and Oral airway Placement Confirmation: ETT inserted through vocal cords under direct vision,  positive ETCO2 and breath sounds checked- equal and bilateral Secured at: 23 cm Tube secured with: Tape Dental Injury: Teeth and Oropharynx as per pre-operative assessment  Comments: Intubated by Raynelle Highland

## 2020-08-14 NOTE — H&P (Signed)
Richard Vaughn is an 69 y.o. male.   Chief Complaint: L shoulder pain and dysfunction HPI: L shoulder rotator cuff tear with pain and dysfunction.  Failed conservative management.  Past Medical History:  Diagnosis Date  . Barrett's esophagus   . Chronic back pain   . Depression   . Gastritis    Mild  . Gastroparesis   . GERD (gastroesophageal reflux disease)   . Headache   . History of kidney stones   . Hyperglycemia   . Hypertension   . Nephrolithiasis   . Pneumonia    Covid pneumonia  . Renal cyst   . Stricture and stenosis of esophagus   . Stroke ([place]) 07-2015  . Vision abnormalities     Past Surgical History:  Procedure Laterality Date  . [place] SURGERY  2012  . COLONOSCOPY    . [place] SURGERY  2005, 2010   replaced L4 and L5  . UPPER GASTROINTESTINAL ENDOSCOPY      Family History  Problem Relation Age of Onset  . Hypertension Father   . Stroke Father   . Hypertension Mother   . Liver cancer Mother   . Hypertension Sister        2 other sisters has also  . Stroke Sister   . Stroke Sister   . Hypertension Brother        2nd brother has also  . Colon cancer Neg Hx   . Esophageal cancer Neg Hx   . Rectal cancer Neg Hx   . Stomach cancer Neg Hx   . Colon polyps Neg Hx    Social History:  reports that he quit smoking about 2 years ago. His smoking use included cigarettes. He has a 15.00 pack-year smoking history. He has never used smokeless tobacco. He reports that he does not drink alcohol and does not use drugs.  Allergies: No Known Allergies  Medications Prior to Admission  Medication Sig Dispense Refill  . aspirin EC 81 MG tablet Take 1 tablet (81 mg total) by mouth daily. Swallow whole. 30 tablet 11  . dexlansoprazole (DEXILANT) 60 MG capsule Take 1 capsule (60 mg total) by mouth daily. Lot: [id number], Exp: 12-2021 15 capsule 0  . esomeprazole (NEXIUM) 40 MG capsule Take 1 capsule (40 mg total) by mouth 2 (two) times daily before a meal.  Please keep your August appointment for further refills. 60 capsule 1  . olmesartan (BENICAR) 20 MG tablet TAKE 1 TABLET DAILY 90 tablet 2  . oxymorphone (OPANA) 10 MG tablet Take 10 mg by mouth in the morning and at bedtime.     . traZODone (DESYREL) 50 MG tablet Take 1 tablet (50 mg total) by mouth at bedtime as needed for sleep. 90 tablet 1  . sucralfate (CARAFATE) 1 GM/10ML suspension Take 10 mLs (1 g total) by mouth every 6 (six) hours as needed. 420 mL 1    No results found for this or any previous visit (from the past 48 hour(s)). No results found.  Review of Systems  Blood pressure 124/87, pulse 66, temperature 98 F (36.7 C), temperature source Oral, resp. rate 16, height 6\' 2"  (1.88 m), weight 106.6 kg, SpO2 98 %. Physical Exam   Assessment/Plan L shoulder rotator cuff tear with pain and dysfunction.  Failed conservative management. Plan arth RCR, SAD Risks / benefits of surgery discussed Consent on chart  NPO for OR Preop antibiotics   Isabella Stalling, MD 08/14/2020, 11:27 AM

## 2020-08-14 NOTE — Anesthesia Procedure Notes (Signed)
Anesthesia Procedure Image    

## 2020-08-14 NOTE — Op Note (Signed)
Procedure(s): SHOULDER ARTHROSCOPY WITH ROTATOR CUFF REPAIR, SUBACROMIAL DECOMPRESSION Procedure Note  Richard Vaughn male 69 y.o. 08/14/2020   Preoperative diagnosis:  #1 left shoulder rotator cuff tear #2 left shoulder impingement with unfavorable acromial anatomy  Postoperative diagnosis: Same   Procedure(s) and Anesthesia Type:    * SHOULDER ARTHROSCOPY WITH ROTATOR CUFF REPAIR, SUBACROMIAL DECOMPRESSION - General  Surgeon(s) and Role:    Tania Ade, MD - Primary     Surgeon: Isabella Stalling   Assistants: Jeanmarie Hubert PA-C (Danielle was present and scrubbed throughout the procedure and was essential in positioning, assisting with the camera and instrumentation,, and closure)  Anesthesia: General endotracheal anesthesia with preoperative interscalene block given by the attending anesthesiologist    Procedure Detail  SHOULDER ARTHROSCOPY WITH ROTATOR CUFF REPAIR, SUBACROMIAL DECOMPRESSION  Estimated Blood Loss: Min         Drains: none  Blood Given: none         Specimens: none        Complications:  * No complications entered in OR log *         Disposition: PACU - hemodynamically stable.         Condition: stable    Procedure:   INDICATIONS FOR SURGERY: The patient is 69 y.o. male who has had a long history of left shoulder pain which has been refractory to nonoperative management.  He was found to have full-thickness rotator cuff tear.  He has multiple medical comorbidities including recent stroke but after being cleared by his primary medical team he wanted to go forward with arthroscopic shoulder surgery to try and alleviate his pain and increase his function.  OPERATIVE FINDINGS: Examination under anesthesia: No significant stiffness or instability.   DESCRIPTION OF PROCEDURE: The patient was identified in preoperative  holding area where I personally marked the operative site after  verifying site, side, and procedure with the  patient. An interscalene block was given by the attending anesthesiologist the holding area.  The patient was taken back to the operating room where general anesthesia was induced without complication and was placed in the beach-chair position with the back  elevated about 60 degrees and all extremities and head and neck carefully padded and  positioned.   The left upper extremity was then prepped and  draped in a standard sterile fashion. The appropriate time-out  procedure was carried out. The patient did receive IV antibiotics  within 30 minutes of incision.   A small posterior portal incision was made and the arthroscope was introduced into the joint. An anterior portal was then established above the subscapularis using needle localization. Small cannula was placed anteriorly. Diagnostic arthroscopy was then carried out.  He was noted to have intact subscapularis.  The biceps tendon was frayed but not torn.  There was some fraying of the superior labrum which was debrided back to stable labrum.  Glenohumeral joint surfaces were intact without significant chondromalacia.  Supraspinatus was noted to be completely torn and retracted to the mid humeral head.  This extended into the infraspinatus as well involving nearly the entire insertion of the infraspinatus.  The arthroscope was then introduced into the subacromial space a standard lateral portal was established with needle localization. The shaver was used through the lateral portal to perform extensive bursectomy.  From the bursal side the rotator cuff was mobilized using combination of ArthroCare and shaver.  After mobilization I was able to bring the posterior and superior rotator cuff back over the tuberosity  without significant tension.  Therefore I did feel that going forward with the repair was appropriate.  A large cannula was placed laterally.  The camera was moved to a posterior lateral viewing portal.  The tuberosity was debrided down  to a bleeding surface to promote healing and the repair was then carried out by first placing 2 4.75 peek swivel lock anchors just off the articular margin preloaded with fiber tape.  These 4 suture strands were passed throughout the tear preferentially pulling the tear from posterior to anterior.  They were then placed into 2 additional anchors in a crossing suture bridge pattern bringing the tendon down nicely over the prepared tuberosity without significant tension.  The coracoacromial ligament was taken down off the anterior acromion with the ArthroCare exposing a large hooked anterior acromial spur. A high-speed bur was then used through the lateral portal to take down the anterior acromial spur from lateral to medial in a standard acromioplasty.  The acromioplasty was also viewed from the lateral portal and the bur was used as necessary to ensure that the acromion was completely flat from posterior to anterior.  The arthroscopic equipment was removed from the joint and the portals were closed with 3-0 nylon in an interrupted fashion. Sterile dressings were then applied including Xeroform 4 x 4's ABDs and tape. The patient was then allowed to awaken from general anesthesia, placed in a sling, transferred to the stretcher and taken to the recovery room in stable condition.   POSTOPERATIVE PLAN: The patient will be discharged home today and will followup in one week for suture removal and wound check.

## 2020-08-14 NOTE — Progress Notes (Signed)
Assisted Dr. Kalman Shan with left, ultrasound guided, interscalene  block. Side rails up, monitors on throughout procedure. See vital signs in flow sheet. Tolerated Procedure well.

## 2020-08-14 NOTE — Anesthesia Postprocedure Evaluation (Signed)
Anesthesia Post Note  Patient: Richard Vaughn  Procedure(s) Performed: SHOULDER ARTHROSCOPY WITH ROTATOR CUFF REPAIR, SUBACROMIAL DECOMPRESSION (Left )     Patient location during evaluation: PACU Anesthesia Type: General Level of consciousness: awake and alert Pain management: pain level controlled Vital Signs Assessment: post-procedure vital signs reviewed and stable Respiratory status: spontaneous breathing, nonlabored ventilation, respiratory function stable and patient connected to nasal cannula oxygen Cardiovascular status: blood pressure returned to baseline and stable Postop Assessment: no apparent nausea or vomiting Anesthetic complications: no   No complications documented.  Last Vitals:  Vitals:   08/14/20 1400 08/14/20 1415  BP: 139/82 138/84  Pulse: 60 (!) 51  Resp: 14 14  Temp: 36.6 C 36.6 C  SpO2: 95% 98%    Last Pain:  Vitals:   08/14/20 1415  TempSrc: Oral  PainSc:                  [name] S

## 2020-08-14 NOTE — Discharge Instructions (Signed)
Discharge Instructions after Arthroscopic Shoulder Repair ° ° °A sling has been provided for you. Remain in your sling at all times. This includes sleeping in your sling.  °Use ice on the shoulder intermittently over the first 48 hours after surgery.  °Pain medicine has been prescribed for you.  °Use your medicine liberally over the first 48 hours, and then you can begin to taper your use. You may take Extra Strength Tylenol or Tylenol only in place of the pain pills. DO NOT take ANY nonsteroidal anti-inflammatory pain medications: Advil, Motrin, Ibuprofen, Aleve, Naproxen, or Narprosyn.  °You may remove your dressing after two days. If the incision sites are still moist, place a Band-Aid over the moist site(s). Change Band-Aids daily until dry.  °You may shower 5 days after surgery. The incisions CANNOT get wet prior to 5 days. Simply allow the water to wash over the site and then pat dry. Do not rub the incisions. Make sure your axilla (armpit) is completely dry after showering.  °Take one aspirin a day for 2 weeks after surgery, unless you have an aspirin sensitivity/ allergy or asthma. ° ° °Please call [phone] during normal business hours or [phone] after hours for any problems. Including the following: ° °- excessive redness of the incisions °- drainage for more than 4 days °- fever of more than 101.5 F ° °*Please note that pain medications will not be refilled after hours or on weekends. ° ° ° °

## 2020-08-15 ENCOUNTER — Encounter (HOSPITAL_COMMUNITY): Payer: Self-pay | Admitting: Orthopedic Surgery

## 2020-08-18 NOTE — Progress Notes (Signed)
He has safe to resume aspirin.  Also cleared for shoulder surgery as recent hemorrhage was more than 6 months ago.

## 2020-08-19 ENCOUNTER — Encounter: Payer: Medicare Other | Attending: Psychology | Admitting: Psychology

## 2020-08-21 ENCOUNTER — Other Ambulatory Visit: Payer: Self-pay | Admitting: Family Medicine

## 2020-08-21 NOTE — Telephone Encounter (Signed)
Pt is calling stating that he is out of the following Rx's amlodipine 2.5 MG and olmesartan (BENICAR) 20 MG  Pharm:  [facility] on [address] and [address]

## 2020-08-25 MED ORDER — OLMESARTAN MEDOXOMIL 20 MG PO TABS
20.0000 mg | ORAL_TABLET | Freq: Every day | ORAL | 3 refills | Status: DC
Start: 2020-08-25 — End: 2021-09-30

## 2020-08-25 MED ORDER — AMLODIPINE BESYLATE 2.5 MG PO TABS: 2.5000 mg | ORAL_TABLET | Freq: Every day | ORAL | 3 refills | Status: DC

## 2020-08-25 NOTE — Telephone Encounter (Signed)
Rx's sent in. °

## 2020-08-28 ENCOUNTER — Ambulatory Visit: Payer: Medicare Other | Admitting: Adult Health

## 2020-09-02 ENCOUNTER — Encounter: Payer: Self-pay | Admitting: Adult Health

## 2020-09-02 ENCOUNTER — Ambulatory Visit: Payer: Medicare Other | Admitting: Adult Health

## 2020-09-08 DIAGNOSIS — M75122 Complete rotator cuff tear or rupture of left shoulder, not specified as traumatic: Secondary | ICD-10-CM | POA: Diagnosis not present

## 2020-09-09 DIAGNOSIS — M75122 Complete rotator cuff tear or rupture of left shoulder, not specified as traumatic: Secondary | ICD-10-CM | POA: Diagnosis not present

## 2020-09-16 DIAGNOSIS — M75122 Complete rotator cuff tear or rupture of left shoulder, not specified as traumatic: Secondary | ICD-10-CM | POA: Diagnosis not present

## 2020-09-18 DIAGNOSIS — M75122 Complete rotator cuff tear or rupture of left shoulder, not specified as traumatic: Secondary | ICD-10-CM | POA: Diagnosis not present

## 2020-09-22 DIAGNOSIS — M75122 Complete rotator cuff tear or rupture of left shoulder, not specified as traumatic: Secondary | ICD-10-CM | POA: Diagnosis not present

## 2020-09-24 DIAGNOSIS — M75122 Complete rotator cuff tear or rupture of left shoulder, not specified as traumatic: Secondary | ICD-10-CM | POA: Diagnosis not present

## 2020-09-29 DIAGNOSIS — M75122 Complete rotator cuff tear or rupture of left shoulder, not specified as traumatic: Secondary | ICD-10-CM | POA: Diagnosis not present

## 2020-10-01 DIAGNOSIS — M75122 Complete rotator cuff tear or rupture of left shoulder, not specified as traumatic: Secondary | ICD-10-CM | POA: Diagnosis not present

## 2020-10-08 DIAGNOSIS — M75122 Complete rotator cuff tear or rupture of left shoulder, not specified as traumatic: Secondary | ICD-10-CM | POA: Diagnosis not present

## 2020-10-19 IMAGING — DX DG CHEST 2V
2 series · 3 of 3 positions shown · non-contrast
Comparison: February 24, 2020

CLINICAL DATA: Cough and fever

EXAM:
CHEST - 2 VIEW

[Series 1: chest pa · 0.14mm/px · 2 of 2 slices shown]
[im 1/2]
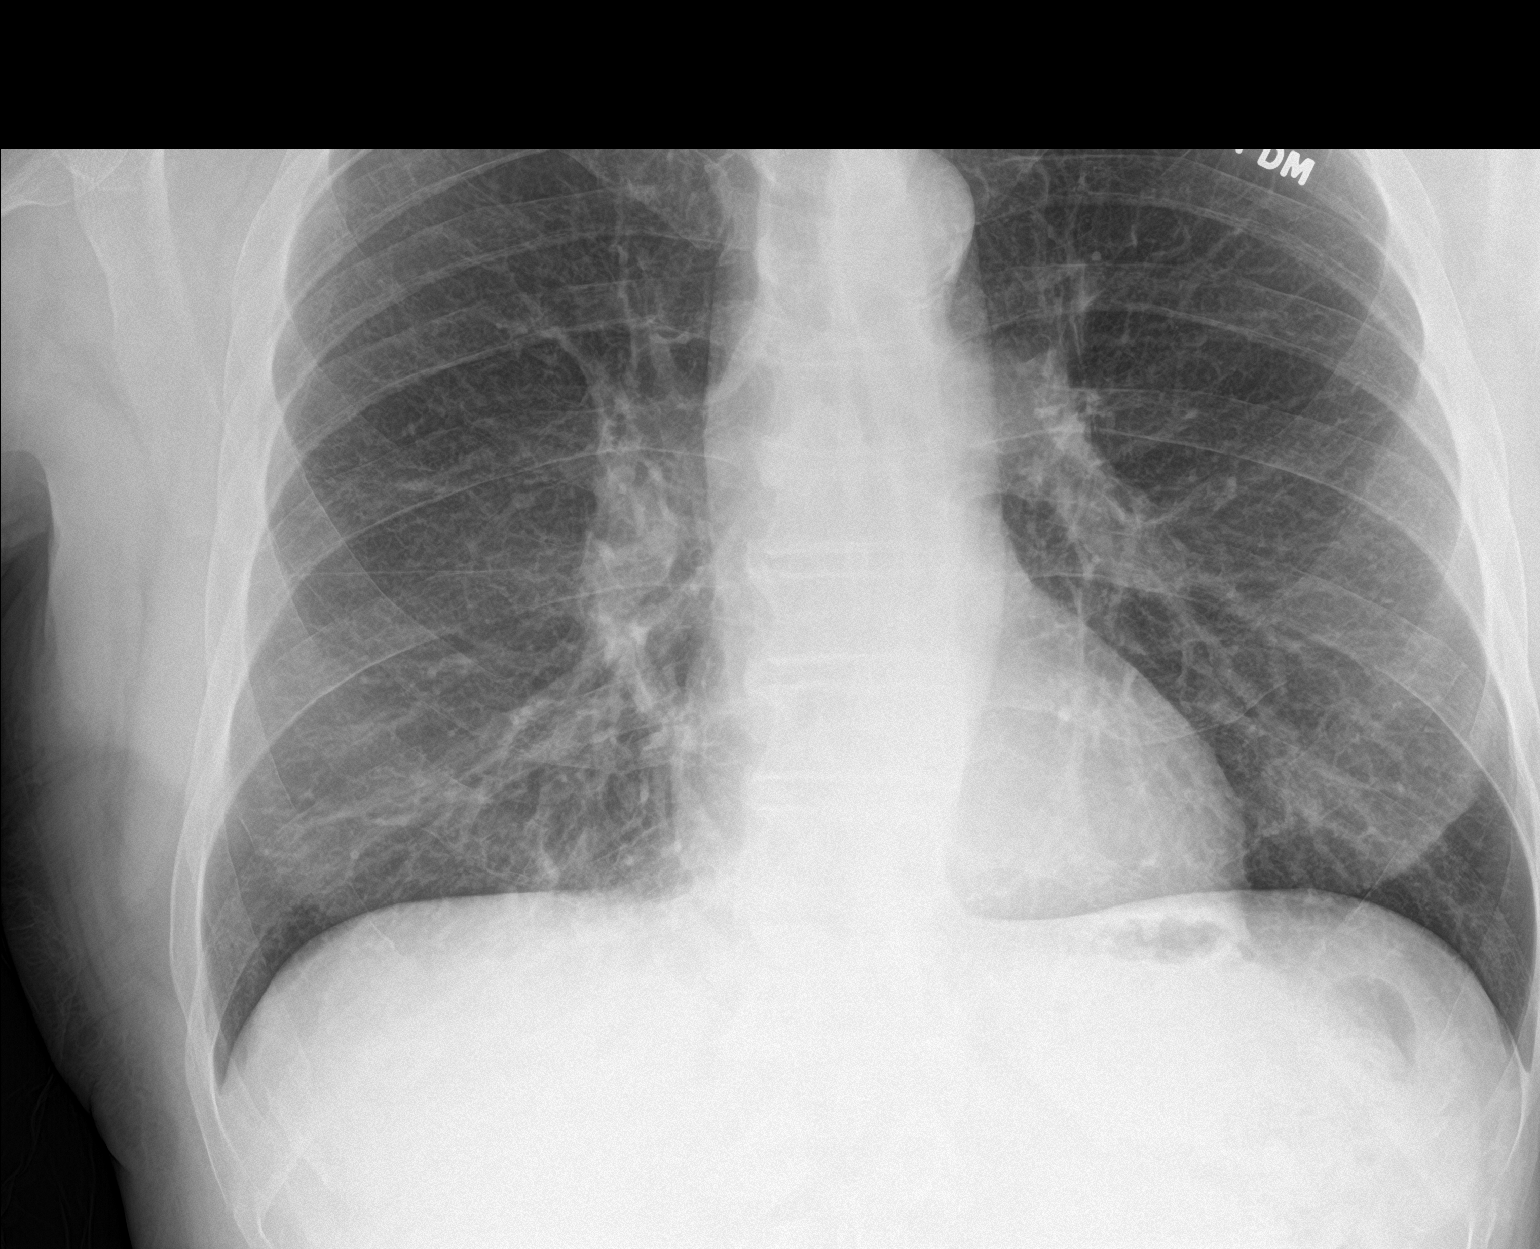
[im 2/2]
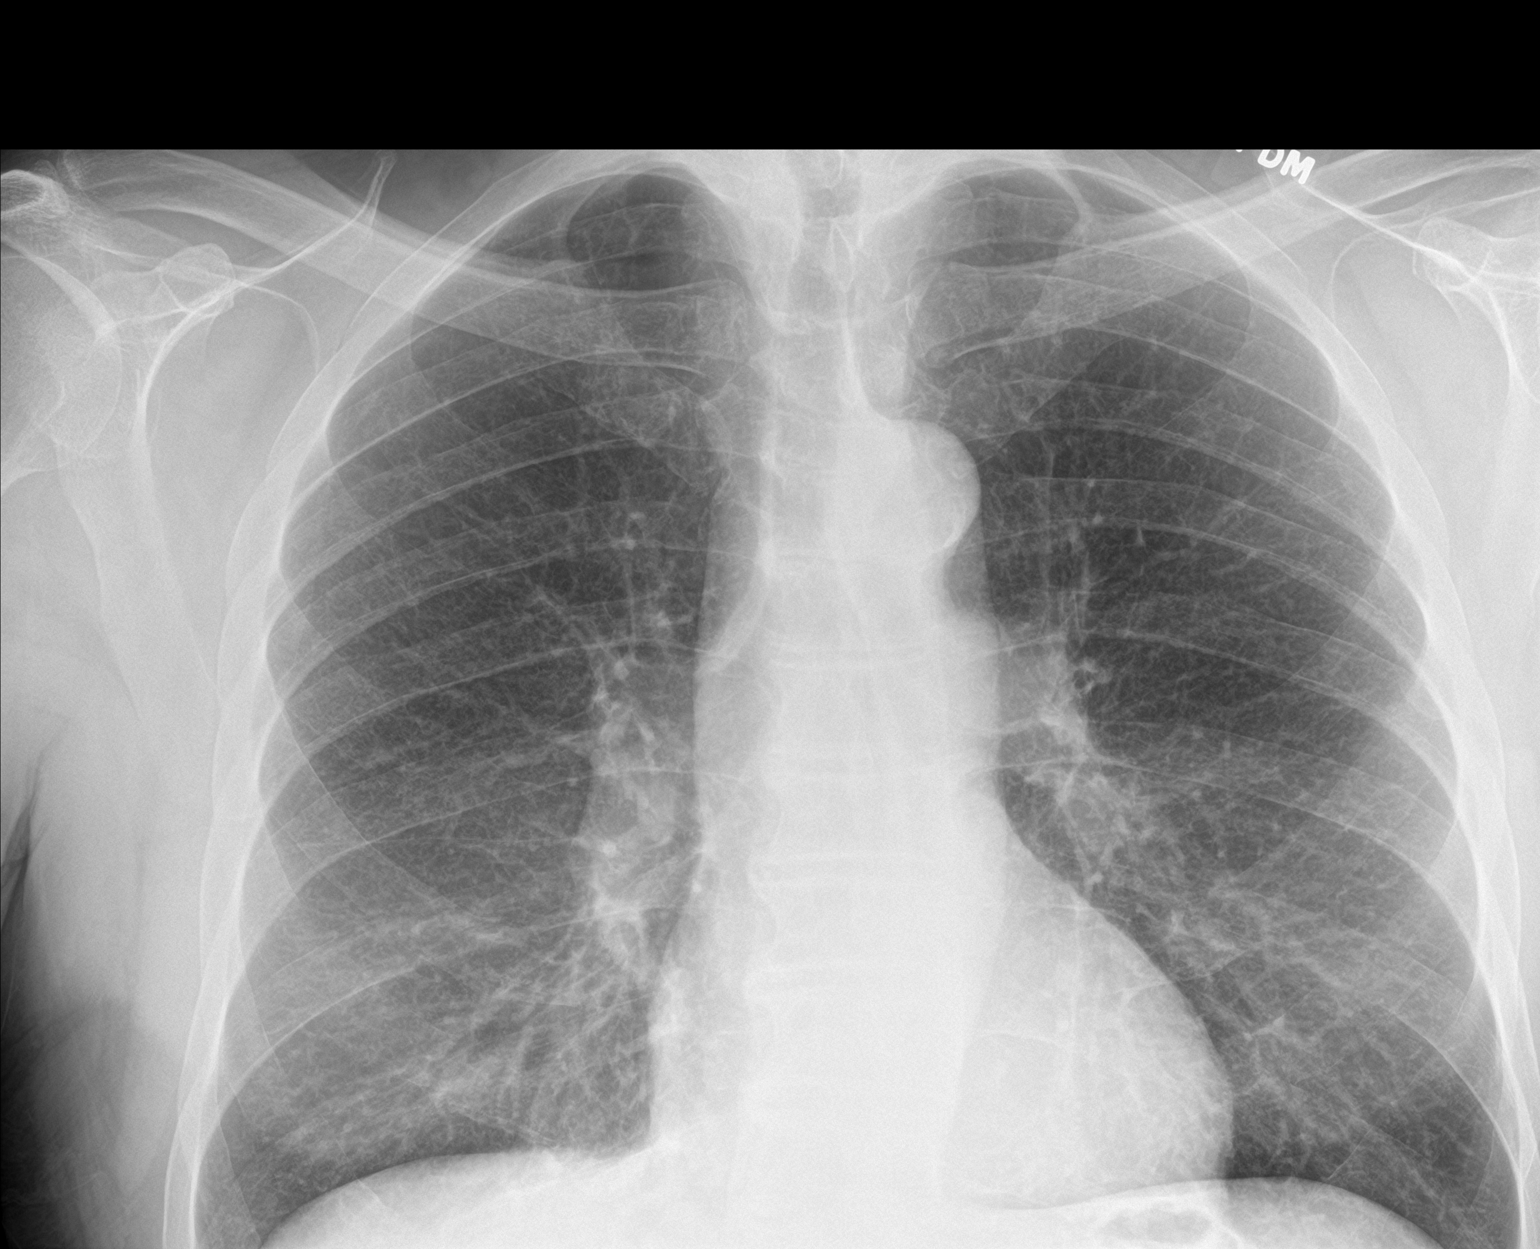

[chest lat]
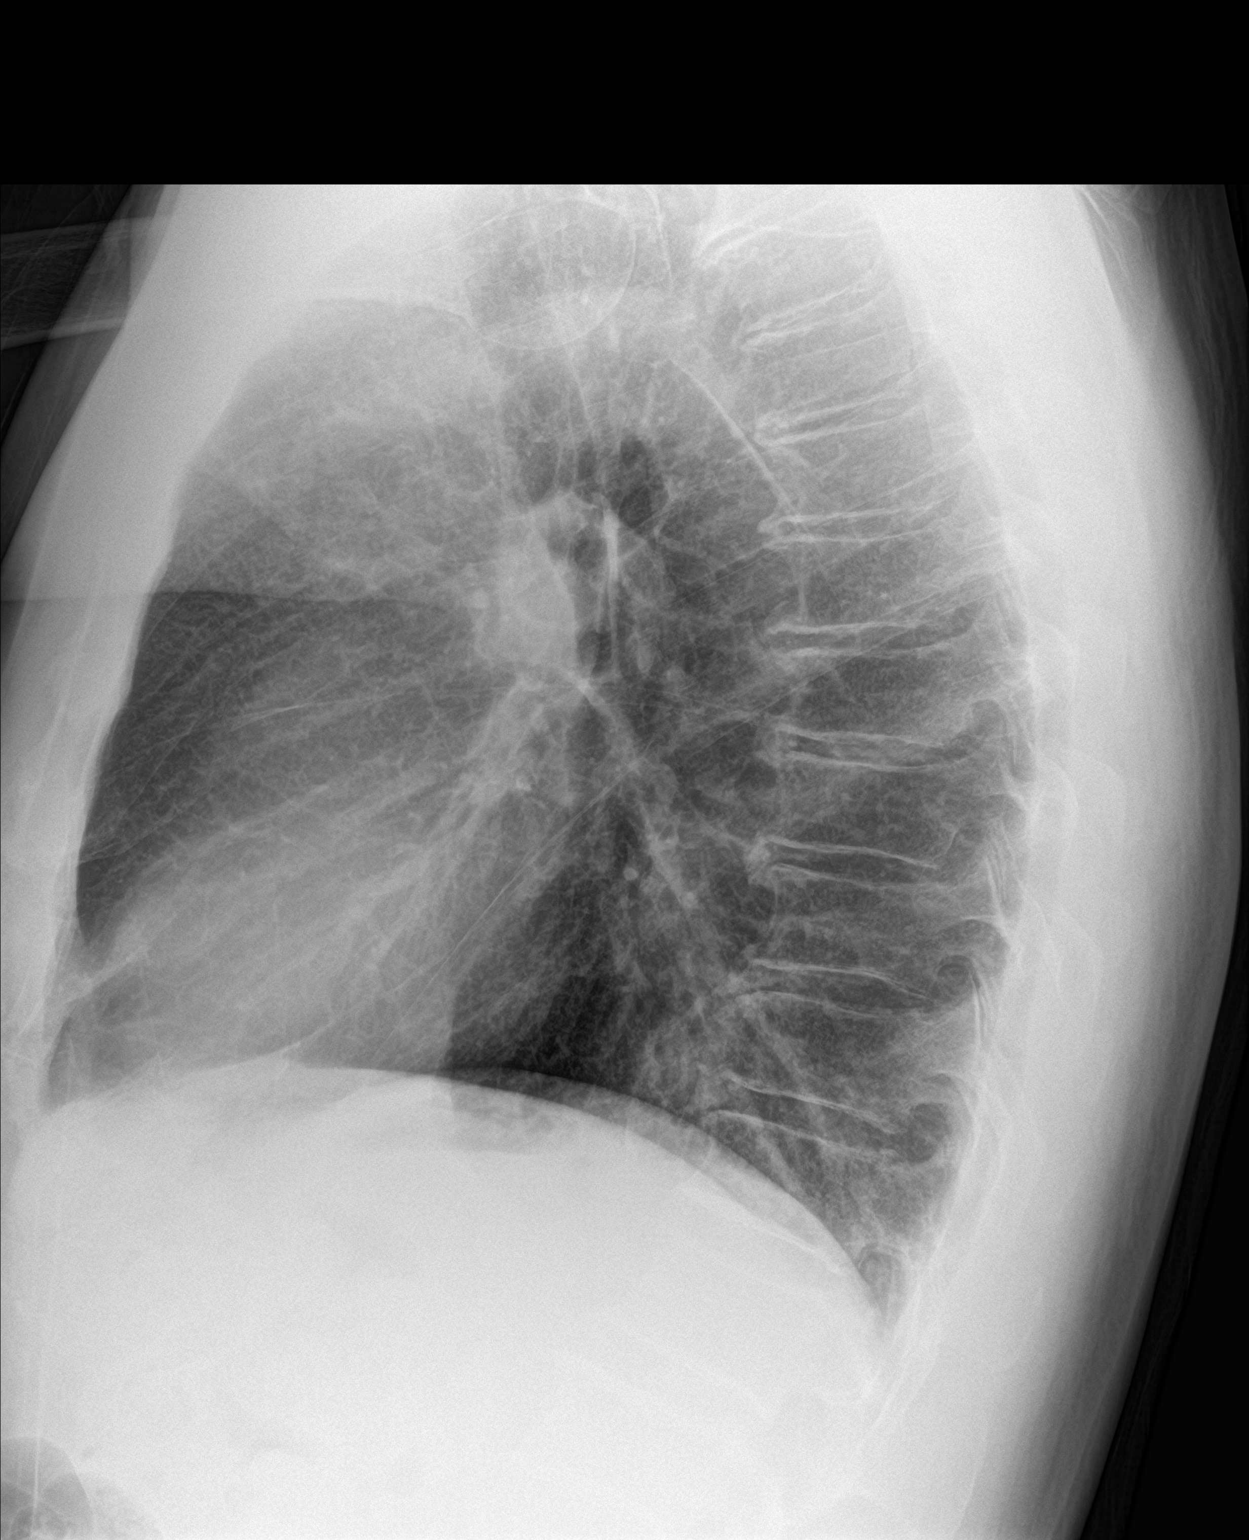

[3 of 3 positions shown; findings below may reference images not displayed]

FINDINGS: Lungs are clear. Heart size and pulmonary vascularity are normal. No
adenopathy. There is aortic atherosclerosis. There is mild
degenerative change in the thoracic spine.
IMPRESSION: Lungs clear.  Heart size normal.

Aortic Atherosclerosis ([id number]-PM8.8).

## 2020-10-20 ENCOUNTER — Telehealth: Payer: Self-pay | Admitting: Family Medicine

## 2020-10-20 NOTE — Telephone Encounter (Signed)
Left message for patient to call back and schedule Medicare Annual Wellness Visit (AWV) either virtually or in office.  NO HX ; please schedule at anytime with [facility] Nurse Health Advisor 2.  This should be a 45 minute visit.   AWV-I PER [facility] AS OF 10/16/19

## 2020-10-21 DIAGNOSIS — M75122 Complete rotator cuff tear or rupture of left shoulder, not specified as traumatic: Secondary | ICD-10-CM | POA: Diagnosis not present

## 2020-10-24 DIAGNOSIS — M75122 Complete rotator cuff tear or rupture of left shoulder, not specified as traumatic: Secondary | ICD-10-CM | POA: Diagnosis not present

## 2020-10-27 DIAGNOSIS — M75122 Complete rotator cuff tear or rupture of left shoulder, not specified as traumatic: Secondary | ICD-10-CM | POA: Diagnosis not present

## 2020-10-29 DIAGNOSIS — M75122 Complete rotator cuff tear or rupture of left shoulder, not specified as traumatic: Secondary | ICD-10-CM | POA: Diagnosis not present

## 2020-11-02 ENCOUNTER — Other Ambulatory Visit: Payer: Self-pay | Admitting: Gastroenterology

## 2020-11-02 DIAGNOSIS — R6881 Early satiety: Secondary | ICD-10-CM

## 2020-11-03 DIAGNOSIS — M75122 Complete rotator cuff tear or rupture of left shoulder, not specified as traumatic: Secondary | ICD-10-CM | POA: Diagnosis not present

## 2020-11-10 DIAGNOSIS — M75122 Complete rotator cuff tear or rupture of left shoulder, not specified as traumatic: Secondary | ICD-10-CM | POA: Diagnosis not present

## 2020-11-13 DIAGNOSIS — M75122 Complete rotator cuff tear or rupture of left shoulder, not specified as traumatic: Secondary | ICD-10-CM | POA: Diagnosis not present

## 2020-11-17 DIAGNOSIS — M75122 Complete rotator cuff tear or rupture of left shoulder, not specified as traumatic: Secondary | ICD-10-CM | POA: Diagnosis not present

## 2020-11-20 DIAGNOSIS — M75122 Complete rotator cuff tear or rupture of left shoulder, not specified as traumatic: Secondary | ICD-10-CM | POA: Diagnosis not present

## 2020-11-24 DIAGNOSIS — M75122 Complete rotator cuff tear or rupture of left shoulder, not specified as traumatic: Secondary | ICD-10-CM | POA: Diagnosis not present

## 2020-11-25 ENCOUNTER — Other Ambulatory Visit: Payer: Self-pay | Admitting: Physical Medicine and Rehabilitation

## 2020-11-25 DIAGNOSIS — G4700 Insomnia, unspecified: Secondary | ICD-10-CM

## 2020-11-26 DIAGNOSIS — M75122 Complete rotator cuff tear or rupture of left shoulder, not specified as traumatic: Secondary | ICD-10-CM | POA: Diagnosis not present

## 2020-12-02 DIAGNOSIS — M75122 Complete rotator cuff tear or rupture of left shoulder, not specified as traumatic: Secondary | ICD-10-CM | POA: Diagnosis not present

## 2020-12-03 DIAGNOSIS — R3912 Poor urinary stream: Secondary | ICD-10-CM | POA: Diagnosis not present

## 2020-12-03 DIAGNOSIS — N401 Enlarged prostate with lower urinary tract symptoms: Secondary | ICD-10-CM | POA: Diagnosis not present

## 2020-12-09 ENCOUNTER — Encounter: Payer: Self-pay | Admitting: Family Medicine

## 2020-12-09 ENCOUNTER — Other Ambulatory Visit: Payer: Self-pay

## 2020-12-09 ENCOUNTER — Ambulatory Visit (INDEPENDENT_AMBULATORY_CARE_PROVIDER_SITE_OTHER): Payer: Medicare Other | Admitting: Family Medicine

## 2020-12-09 VITALS — BP 128/70 | HR 67 | Resp 16 | Ht 74.0 in | Wt 235.1 lb

## 2020-12-09 DIAGNOSIS — Z0000 Encounter for general adult medical examination without abnormal findings: Secondary | ICD-10-CM

## 2020-12-09 DIAGNOSIS — Z23 Encounter for immunization: Secondary | ICD-10-CM

## 2020-12-09 DIAGNOSIS — Z0001 Encounter for general adult medical examination with abnormal findings: Secondary | ICD-10-CM

## 2020-12-09 DIAGNOSIS — I674 Hypertensive encephalopathy: Secondary | ICD-10-CM | POA: Diagnosis not present

## 2020-12-09 DIAGNOSIS — I739 Peripheral vascular disease, unspecified: Secondary | ICD-10-CM

## 2020-12-09 DIAGNOSIS — R739 Hyperglycemia, unspecified: Secondary | ICD-10-CM

## 2020-12-09 DIAGNOSIS — G4700 Insomnia, unspecified: Secondary | ICD-10-CM

## 2020-12-09 DIAGNOSIS — E785 Hyperlipidemia, unspecified: Secondary | ICD-10-CM | POA: Diagnosis not present

## 2020-12-09 DIAGNOSIS — Z136 Encounter for screening for cardiovascular disorders: Secondary | ICD-10-CM | POA: Diagnosis not present

## 2020-12-09 LAB — LIPID PANEL
Cholesterol: 139 mg/dL (ref 0–200)
HDL: 40.3 mg/dL (ref >39.00)
LDL Cholesterol: 85 mg/dL (ref 0–99)
NonHDL: 98.31
Total CHOL/HDL Ratio: 3
Triglycerides: 68 mg/dL (ref 0.0–149.0)
VLDL: 13.6 mg/dL (ref 0.0–40.0)

## 2020-12-09 LAB — COMPREHENSIVE METABOLIC PANEL
ALT: 15 U/L (ref 0–53)
AST: 13 U/L (ref 0–37)
Albumin: 4.5 g/dL (ref 3.5–5.2)
Alkaline Phosphatase: 66 U/L (ref 39–117)
BUN: 12 mg/dL (ref 6–23)
CO2: 29 mEq/L (ref 19–32)
Calcium: 9.3 mg/dL (ref 8.4–10.5)
Chloride: 105 mEq/L (ref 96–112)
Creatinine, Ser: 0.93 mg/dL (ref 0.40–1.50)
GFR: 85.2 mL/min (ref >60.00)
Glucose, Bld: 82 mg/dL (ref 70–99)
Potassium: 3.9 mEq/L (ref 3.5–5.1)
Sodium: 139 mEq/L (ref 135–145)
Total Bilirubin: 0.7 mg/dL (ref 0.2–1.2)
Total Protein: 6.9 g/dL (ref 6.0–8.3)

## 2020-12-09 LAB — HEMOGLOBIN A1C: Hgb A1c MFr Bld: 5.4 % (ref 4.6–6.5)

## 2020-12-09 MED ORDER — TRAZODONE HCL 50 MG PO TABS: 50.0000 mg | ORAL_TABLET | Freq: Every evening | ORAL | 2 refills | Status: DC | PRN

## 2020-12-09 NOTE — Progress Notes (Signed)
HPI:  Richard Vaughn is a pleasant 70 y.o.male here today with his wife for AWV and  his routine physical examination.  Last CPE: 08/15/17 He lives with his wife.  Regular exercise 3 or more times per week: Walking the tread mill a few times per week. He is also doing PT for his left shoulder. Following a healthy diet: Yes,He eats fish a vegetables mainly but also like sweets: Muffins x 2 small daily.  Chronic medical problems: CVA,PAD,IFG,HLD,chronic pain, generalized OA,OSA,and insomnia among some. CVA with dysarthria and LLE weakness. Left shoulder pain, still doing PT. S/P arthroscopy in 07/2020.  Immunization History  Administered Date(s) Administered  . PFIZER(Purple Top)SARS-COV-2 Vaccination 01/05/2020, 01/29/2020  . Pneumococcal Polysaccharide-23 12/09/2020  . Tdap 12/27/2014   -Hep C screening: 08/15/17 NR. Last colon cancer screening: 12/20/14, he is due in 12/2021. Last prostate ca screening: 0.42 on 12/06/2019.  Negative for high alcohol intake. Former smoker.  -Concerns and/or follow up today:   IFG: Negative for polydipsia,polyuria, or polyphagia.  Lab Results  Component Value Date   HGBA1C 5.4 02/22/2020   HTN: Currently he is on Benicar 20 mg daily and Amlodipine 2.5 mg daily. Negative for new weakness.  Component     Latest Ref Rng & Units 08/07/2020          Glucose     70 - 99 mg/dL 104 (H)  BUN     6 - 23 mg/dL 12  Creatinine     0.40 - 1.50 mg/dL 0.89  BUN/Creatinine Ratio     6 - 22 (calc)   Sodium     135 - 145 mEq/L 141  Potassium     3.5 - 5.1 mEq/L 4.0  Chloride     96 - 112 mEq/L 107  CO2     19 - 32 mEq/L 27  Calcium     8.4 - 10.5 mg/dL 9.1    HLD: He is not on statin medication. Component     Latest Ref Rng & Units 02/22/2020  Cholesterol     0 - 200 mg/dL 125  Triglycerides     0.0 - 149.0 mg/dL 86  HDL Cholesterol     >39.00 mg/dL 35 (L)  VLDL     0.0 - 40.0 mg/dL 17  LDL (calc)     0 - 99 mg/dL 73  Total  CHOL/HDL Ratio      3.6   Insomnia: He is on Trazodone 50 mg daily, which is still helping. C/O decreased appetite since his last CVA 03/07/20, he would like med to increase appetite. Negative for dysphagia.   Review of Systems  Constitutional: Positive for appetite change (After last CVA) and fatigue. Negative for activity change and fever.  HENT: Negative for nosebleeds and sore throat.   Eyes: Negative for redness and visual disturbance.  Respiratory: Negative for cough, shortness of breath and wheezing.   Cardiovascular: Negative for chest pain, palpitations and leg swelling.  Gastrointestinal: Negative for abdominal pain, nausea and vomiting.  Endocrine: Negative for cold intolerance and heat intolerance.  Genitourinary: Negative for decreased urine volume, dysuria and hematuria.  Musculoskeletal: Positive for arthralgias, back pain and gait problem.  Allergic/Immunologic: Positive for environmental allergies.  Neurological: Positive for speech difficulty. Negative for weakness and headaches.  Psychiatric/Behavioral: Positive for sleep disturbance. Negative for confusion.  All other systems reviewed and are negative.  Current Outpatient Medications on File Prior to Visit  Medication Sig Dispense Refill  . amLODipine (NORVASC) 2.5  MG tablet Take 1 tablet (2.5 mg total) by mouth daily. 90 tablet 3  . aspirin EC 81 MG tablet Take 1 tablet (81 mg total) by mouth daily. Swallow whole. 30 tablet 11  . esomeprazole (NEXIUM) 40 MG capsule TAKE 1 CAPSULE (40 MG TOTAL) BY MOUTH 2 (TWO) TIMES DAILY BEFORE A MEAL. 60 capsule 1  . olmesartan (BENICAR) 20 MG tablet Take 1 tablet (20 mg total) by mouth daily. 90 tablet 3  . oxyCODONE-acetaminophen (PERCOCET) 5-325 MG tablet Take 1-2 tablets every 4 hours as needed for post operative pain. MAX 6/day 30 tablet 0  . oxymorphone (OPANA) 10 MG tablet Take 10 mg by mouth in the morning and at bedtime.    . sucralfate (CARAFATE) 1 GM/10ML suspension Take  10 mLs (1 g total) by mouth every 6 (six) hours as needed. 420 mL 1  . tiZANidine (ZANAFLEX) 4 MG tablet Take 1 tablet (4 mg total) by mouth every 8 (eight) hours as needed for muscle spasms. 30 tablet 1   No current facility-administered medications on file prior to visit.   Past Medical History:  Diagnosis Date  . Barrett's esophagus   . Chronic back pain   . Depression   . Gastritis    Mild  . Gastroparesis   . GERD (gastroesophageal reflux disease)   . Headache   . History of kidney stones   . Hyperglycemia   . Hypertension   . Nephrolithiasis   . Pneumonia    Covid pneumonia  . Renal cyst   . Stricture and stenosis of esophagus   . Stroke ([place]) 07-2015  . Vision abnormalities     Past Surgical History:  Procedure Laterality Date  . [place] SURGERY  2012  . COLONOSCOPY    . [employer] SURGERY  2005, 2010   replaced L4 and L5  . SHOULDER ARTHROSCOPY WITH ROTATOR CUFF REPAIR Left 08/14/2020   Procedure: SHOULDER ARTHROSCOPY WITH ROTATOR CUFF REPAIR, SUBACROMIAL DECOMPRESSION;  Surgeon: Tania Ade, MD;  Location: [facility];  Service: Orthopedics;  Laterality: Left;  . UPPER GASTROINTESTINAL ENDOSCOPY      No Known Allergies  Family History  Problem Relation Age of Onset  . Hypertension Father   . Stroke Father   . Hypertension Mother   . Liver cancer Mother   . Hypertension Sister        2 other sisters has also  . Stroke Sister   . Stroke Sister   . Hypertension Brother        2nd brother has also  . Colon cancer Neg Hx   . Esophageal cancer Neg Hx   . Rectal cancer Neg Hx   . Stomach cancer Neg Hx   . Colon polyps Neg Hx     Social History   Socioeconomic History  . Marital status: Married    Spouse name: Christropher Gintz  . Number of children: 3  . Years of education: Not on file  . Highest education level: Not on file  Occupational History  . Occupation: retired    Fish farm manager: [employer]  Tobacco Use  . Smoking status: Former Smoker     Packs/day: 0.50    Years: 30.00    Pack years: 15.00    Types: Cigarettes    Quit date: 04/15/2018    Years since quitting: 2.6  . Smokeless tobacco: Never Used  Vaping Use  . Vaping Use: Never used  Substance and Sexual Activity  . Alcohol use: No  Alcohol/week: 0.0 standard drinks  . Drug use: Never    Comment: last use 04/2018  . Sexual activity: Yes  Other Topics Concern  . Not on file  Social History Narrative  . Not on file   Social Determinants of Health   Financial Resource Strain: Not on file  Food Insecurity: Not on file  Transportation Needs: Not on file  Physical Activity: Not on file  Stress: Not on file  Social Connections: Not on file   Vitals:   12/09/20 0930  BP: 128/70  Pulse: 67  Resp: 16  SpO2: 97%   Body mass index is 30.19 kg/m.  Wt Readings from Last 3 Encounters:  12/09/20 235 lb 2 oz (106.7 kg)  08/14/20 235 lb (106.6 kg)  08/07/20 233 lb 3 oz (105.8 kg)   Physical Exam Vitals and nursing note reviewed.  Constitutional:      General: He is not in acute distress.    Appearance: He is well-developed.  HENT:     Head: Normocephalic and atraumatic.     Right Ear: Tympanic membrane, ear canal and external ear normal.     Left Ear: Tympanic membrane, ear canal and external ear normal.     Mouth/Throat:     Mouth: Mucous membranes are moist.     Pharynx: Oropharynx is clear.  Eyes:     Conjunctiva/sclera: Conjunctivae normal.  Cardiovascular:     Rate and Rhythm: Normal rate and regular rhythm.     Heart sounds: No murmur heard.     Comments: DP pulse present, bilateral. Pulmonary:     Effort: Pulmonary effort is normal. No respiratory distress.     Breath sounds: Normal breath sounds.  Abdominal:     Palpations: Abdomen is soft. There is no hepatomegaly or mass.     Tenderness: There is no abdominal tenderness.  Musculoskeletal:     Left shoulder: Tenderness present. Decreased range of motion.  Lymphadenopathy:     Cervical:  No cervical adenopathy.  Skin:    General: Skin is warm.     Findings: No erythema or rash.  Neurological:     Mental Status: He is alert and oriented to person, place, and time.     Cranial Nerves: Dysarthria present. No facial asymmetry.     Comments: Unstable gait, assisted by a cane.  Psychiatric:     Comments: Well groomed, good eye contact.    ASSESSMENT AND PLAN:  RichardQuame was seen today for annual exam and medicare wellness.  Diagnoses and all orders for this visit: Orders Placed This Encounter  Procedures  . US AORTA MEDICARE SCREENING  . Pneumococcal polysaccharide vaccine 23-valent greater than or equal to 2yo subcutaneous/IM  . Comprehensive metabolic panel  . Lipid panel  . Hemoglobin A1c   Lab Results  Component Value Date   HGBA1C 5.4 12/09/2020   Lab Results  Component Value Date   CHOL 139 12/09/2020   HDL 40.30 12/09/2020   LDLCALC 85 12/09/2020   TRIG 68.0 12/09/2020   CHOLHDL 3 12/09/2020   Lab Results  Component Value Date   CREATININE 0.93 12/09/2020   BUN 12 12/09/2020   NA 139 12/09/2020   K 3.9 12/09/2020   CL 105 12/09/2020   CO2 29 12/09/2020   Lab Results  Component Value Date   ALT 15 12/09/2020   AST 13 12/09/2020   ALKPHOS 66 12/09/2020   BILITOT 0.7 12/09/2020   Medicare annual wellness visit, subsequent We discussed the importance of staying  active, physically and mentally, as well as the benefits of a healthy/balance diet. Low impact exercise that involve stretching and strengthing are ideal.  We discussed preventive screening for the next 5-10 years, summery of recommendations given in AVS. AAA screening will be arranged. Colonoscopy due in 12/2021. Fall prevention.  Advance directives and end of life discussed, he doe snot have POA or living will, advance health care planning package given.   Encounter for general adult medical examination with abnormal findings He understands the importance of regular physical  activity and healthy diet for prevention of chronic illness and/or complications. Preventive guidelines reviewed. Vaccination updated.  Next CPE in a year.  Hyperlipidemia with target LDL less than 70 We discussed a few benefits from statin medication. Lovastatin 20 mg daily recommended.  -     lovastatin (MEVACOR) 20 MG tablet; Take 1 tablet (20 mg total) by mouth at bedtime.  Systolic hypertension with cerebrovascular disease BP adequately controlled. No changes in current management.  Insomnia, unspecified type Problem seems to be adequately controlled. We could change to Mirtazapine to help with appetite, he prefers not to  make med changes today. Good sleep hygiene also recommended.  -     traZODone (DESYREL) 50 MG tablet; Take 1 tablet (50 mg total) by mouth at bedtime as needed for sleep.  PAD (peripheral artery disease) (HCC) Lovastatin 20 mg daily added. Aspirin 81 mg daily.  Acute hyperglycemia Healthy life style for diabetes prevention. Further recommendations according to A1C result.  -     Hemoglobin A1c  Need for pneumococcal vaccination -     Pneumococcal polysaccharide vaccine 23-valent greater than or equal to 2yo subcutaneous/IM   Return in about 6 months (around 06/08/2021).   [name]. Martinique, MD  [facility]. [place] office.   Mr. Mcnerney , Thank you for taking time to come for your Medicare Wellness Visit. I appreciate your ongoing commitment to your health goals. Please review the following plan we discussed and let me know if I can assist you in the future.   These are the goals we discussed: Goals    . Quit smoking / using tobacco       This is a list of the screening recommended for you and due dates:  Health Maintenance  Topic Date Due  . Eye exam for diabetics  Never done  . Complete foot exam   08/15/2018  . Pneumonia vaccines (1 of 2 - PCV13) Never done  . Colon Cancer Screening  12/21/2019  . Flu Shot  06/15/2020  .  COVID-19 Vaccine (3 - Booster for Pfizer series) 07/31/2020  . Hemoglobin A1C  08/23/2020  . Tetanus Vaccine  12/27/2024  .  Hepatitis C: One time screening is recommended by Center for Disease Control  (CDC) for  adults born from [birth year] through 1963.   Completed   A few tips:  -As we age balance is not as good as it was, so there is a higher risks for falls. Please remove small rugs and furniture that is "in your way" and could increase the risk of falls. Stretching exercises may help with fall prevention: Yoga and Tai Chi are some examples. Low impact exercise is better, so you are not very achy the next day.  -Sun screen and avoidance of direct sun light recommended. Caution with dehydration, if working outdoors be sure to drink enough fluids.  - Some medications are not safe as we age, increases the risk of side effects and can  potentially interact with other medication you are also taken;  including some of over the counter medications. Be sure to let me know when you start a new medication even if it is a dietary/vitamin supplement.   -Healthy diet low in red meet/animal fat and sugar + regular physical activity is recommended.     A few things to remember from today's visit:   Hyperlipidemia with target LDL less than 70 - Plan: Comprehensive metabolic panel, Lipid panel  Systolic hypertension with cerebrovascular disease  Insomnia, unspecified type - Plan: traZODone (DESYREL) 50 MG tablet  PAD (peripheral artery disease) ([place]), Chronic  Medicare annual wellness visit, subsequent  Acute hyperglycemia - Plan: Hemoglobin A1c  If you need refills please call your pharmacy. Do not use My Chart to request refills or for acute issues that need immediate attention.   No changes today.  Please be sure medication list is accurate. If a new problem present, please set up appointment sooner than planned today.

## 2020-12-09 NOTE — Patient Instructions (Addendum)
  [name] , Thank you for taking time to come for your Medicare Wellness Visit. I appreciate your ongoing commitment to your health goals. Please review the following plan we discussed and let me know if I can assist you in the future.   These are the goals we discussed: Goals    . Quit smoking / using tobacco       This is a list of the screening recommended for you and due dates:  Health Maintenance  Topic Date Due  . Eye exam for diabetics  Never done  . Complete foot exam   08/15/2018  . Pneumonia vaccines (1 of 2 - PCV13) Never done  . Colon Cancer Screening  12/21/2019  . Flu Shot  06/15/2020  . COVID-19 Vaccine (3 - Booster for Pfizer series) 07/31/2020  . Hemoglobin A1C  08/23/2020  . Tetanus Vaccine  12/27/2024  .  Hepatitis C: One time screening is recommended by Center for Disease Control  (CDC) for  adults born from [birth year] through 1963.   Completed   A few tips:  -As we age balance is not as good as it was, so there is a higher risks for falls. Please remove small rugs and furniture that is "in your way" and could increase the risk of falls. Stretching exercises may help with fall prevention: Yoga and Tai Chi are some examples. Low impact exercise is better, so you are not very achy the next day.  -Sun screen and avoidance of direct sun light recommended. Caution with dehydration, if working outdoors be sure to drink enough fluids.  - Some medications are not safe as we age, increases the risk of side effects and can potentially interact with other medication you are also taken;  including some of over the counter medications. Be sure to let me know when you start a new medication even if it is a dietary/vitamin supplement.   -Healthy diet low in red meet/animal fat and sugar + regular physical activity is recommended.     A few things to remember from today's visit:   Hyperlipidemia with target LDL less than 70 - Plan: Comprehensive metabolic panel, Lipid  panel  Systolic hypertension with cerebrovascular disease  Insomnia, unspecified type - Plan: traZODone (DESYREL) 50 MG tablet  PAD (peripheral artery disease) ([place]), Chronic  Medicare annual wellness visit, subsequent  Acute hyperglycemia - Plan: Hemoglobin A1c  If you need refills please call your pharmacy. Do not use My Chart to request refills or for acute issues that need immediate attention.   No changes today.  Please be sure medication list is accurate. If a new problem present, please set up appointment sooner than planned today.

## 2020-12-11 DIAGNOSIS — M75122 Complete rotator cuff tear or rupture of left shoulder, not specified as traumatic: Secondary | ICD-10-CM | POA: Diagnosis not present

## 2020-12-11 MED ORDER — LOVASTATIN 20 MG PO TABS: 20.0000 mg | ORAL_TABLET | Freq: Every day | ORAL | 3 refills | Status: DC

## 2020-12-13 ENCOUNTER — Encounter: Payer: Self-pay | Admitting: Family Medicine

## 2020-12-15 DIAGNOSIS — M75122 Complete rotator cuff tear or rupture of left shoulder, not specified as traumatic: Secondary | ICD-10-CM | POA: Diagnosis not present

## 2020-12-15 DIAGNOSIS — Z4789 Encounter for other orthopedic aftercare: Secondary | ICD-10-CM | POA: Diagnosis not present

## 2020-12-15 DIAGNOSIS — M25612 Stiffness of left shoulder, not elsewhere classified: Secondary | ICD-10-CM | POA: Diagnosis not present

## 2020-12-17 DIAGNOSIS — M75122 Complete rotator cuff tear or rupture of left shoulder, not specified as traumatic: Secondary | ICD-10-CM | POA: Diagnosis not present

## 2020-12-22 DIAGNOSIS — M75122 Complete rotator cuff tear or rupture of left shoulder, not specified as traumatic: Secondary | ICD-10-CM | POA: Diagnosis not present

## 2020-12-29 DIAGNOSIS — M75122 Complete rotator cuff tear or rupture of left shoulder, not specified as traumatic: Secondary | ICD-10-CM | POA: Diagnosis not present

## 2021-01-02 DIAGNOSIS — M75122 Complete rotator cuff tear or rupture of left shoulder, not specified as traumatic: Secondary | ICD-10-CM | POA: Diagnosis not present

## 2021-01-04 ENCOUNTER — Other Ambulatory Visit: Payer: Self-pay | Admitting: Gastroenterology

## 2021-01-04 DIAGNOSIS — R6881 Early satiety: Secondary | ICD-10-CM

## 2021-01-05 DIAGNOSIS — M75122 Complete rotator cuff tear or rupture of left shoulder, not specified as traumatic: Secondary | ICD-10-CM | POA: Diagnosis not present

## 2021-01-08 DIAGNOSIS — M75122 Complete rotator cuff tear or rupture of left shoulder, not specified as traumatic: Secondary | ICD-10-CM | POA: Diagnosis not present

## 2021-01-14 DIAGNOSIS — M75122 Complete rotator cuff tear or rupture of left shoulder, not specified as traumatic: Secondary | ICD-10-CM | POA: Diagnosis not present

## 2021-01-20 DIAGNOSIS — M75122 Complete rotator cuff tear or rupture of left shoulder, not specified as traumatic: Secondary | ICD-10-CM | POA: Diagnosis not present

## 2021-01-21 DIAGNOSIS — M75122 Complete rotator cuff tear or rupture of left shoulder, not specified as traumatic: Secondary | ICD-10-CM | POA: Diagnosis not present

## 2021-01-26 DIAGNOSIS — M75122 Complete rotator cuff tear or rupture of left shoulder, not specified as traumatic: Secondary | ICD-10-CM | POA: Diagnosis not present

## 2021-01-28 DIAGNOSIS — M75122 Complete rotator cuff tear or rupture of left shoulder, not specified as traumatic: Secondary | ICD-10-CM | POA: Diagnosis not present

## 2021-01-29 ENCOUNTER — Ambulatory Visit (INDEPENDENT_AMBULATORY_CARE_PROVIDER_SITE_OTHER): Payer: Medicare Other | Admitting: Podiatry

## 2021-01-29 ENCOUNTER — Encounter: Payer: Self-pay | Admitting: Podiatry

## 2021-01-29 ENCOUNTER — Other Ambulatory Visit: Payer: Self-pay

## 2021-01-29 DIAGNOSIS — M79675 Pain in left toe(s): Secondary | ICD-10-CM

## 2021-01-29 DIAGNOSIS — M79674 Pain in right toe(s): Secondary | ICD-10-CM

## 2021-01-29 DIAGNOSIS — L600 Ingrowing nail: Secondary | ICD-10-CM | POA: Diagnosis not present

## 2021-01-29 DIAGNOSIS — B351 Tinea unguium: Secondary | ICD-10-CM

## 2021-02-01 NOTE — Progress Notes (Signed)
Subjective:   Patient ID: Richard Vaughn, male   DOB: 70 y.o.   MRN: [id number]   HPI Patient presents with deformed nail bed and 1-5 both feet with the hallux being especially deformed painful loose and creating trouble wearing shoe gear.  Patient presents with caregiver stating that she uses it he would remove these nails.  Patient does not smoke and is not active   Review of Systems  All other systems reviewed and are negative.       Objective:  Physical Exam Vitals and nursing note reviewed.  Constitutional:      Appearance: He is well-developed.  Pulmonary:     Effort: Pulmonary effort is normal.  Musculoskeletal:        General: Normal range of motion.  Skin:    General: Skin is warm.  Neurological:     Mental Status: He is alert.     Neurovascular status was found to be intact muscle strength was found to be adequate range of motion adequate.  Patient is noted to have severely deformed hallux nails of both feet that are dystrophic on the dorsal surfaces and is noted to have remaining nails that are thickened and can become painful but not to the same degree of deformity as the large nails.  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  At this point I have recommended removal of the nails and I did educate him on the surgery that would be required and he wants to get this done but cannot do today.  I debrided nailbeds 1-5 both feet no iatrogenic bleeding and will be seen back for probable permanent procedures which were explained today

## 2021-02-05 DIAGNOSIS — M75122 Complete rotator cuff tear or rupture of left shoulder, not specified as traumatic: Secondary | ICD-10-CM | POA: Diagnosis not present

## 2021-02-13 DIAGNOSIS — M75122 Complete rotator cuff tear or rupture of left shoulder, not specified as traumatic: Secondary | ICD-10-CM | POA: Diagnosis not present

## 2021-02-17 DIAGNOSIS — M75122 Complete rotator cuff tear or rupture of left shoulder, not specified as traumatic: Secondary | ICD-10-CM | POA: Diagnosis not present

## 2021-03-03 DIAGNOSIS — M75122 Complete rotator cuff tear or rupture of left shoulder, not specified as traumatic: Secondary | ICD-10-CM | POA: Diagnosis not present

## 2021-03-18 ENCOUNTER — Encounter: Payer: Self-pay | Admitting: Family Medicine

## 2021-03-27 ENCOUNTER — Ambulatory Visit: Payer: Medicare Other | Admitting: Family Medicine

## 2021-04-02 DIAGNOSIS — M75122 Complete rotator cuff tear or rupture of left shoulder, not specified as traumatic: Secondary | ICD-10-CM | POA: Diagnosis not present

## 2021-04-07 ENCOUNTER — Encounter: Payer: Self-pay | Admitting: Family Medicine

## 2021-04-07 ENCOUNTER — Other Ambulatory Visit: Payer: Self-pay

## 2021-04-07 ENCOUNTER — Ambulatory Visit (INDEPENDENT_AMBULATORY_CARE_PROVIDER_SITE_OTHER): Payer: Medicare Other | Admitting: Family Medicine

## 2021-04-07 VITALS — BP 126/80 | HR 67 | Resp 16 | Ht 74.0 in | Wt 229.4 lb

## 2021-04-07 DIAGNOSIS — F329 Major depressive disorder, single episode, unspecified: Secondary | ICD-10-CM

## 2021-04-07 DIAGNOSIS — I2510 Atherosclerotic heart disease of native coronary artery without angina pectoris: Secondary | ICD-10-CM

## 2021-04-07 DIAGNOSIS — E785 Hyperlipidemia, unspecified: Secondary | ICD-10-CM | POA: Diagnosis not present

## 2021-04-07 DIAGNOSIS — R296 Repeated falls: Secondary | ICD-10-CM | POA: Diagnosis not present

## 2021-04-07 DIAGNOSIS — G4700 Insomnia, unspecified: Secondary | ICD-10-CM

## 2021-04-07 DIAGNOSIS — G894 Chronic pain syndrome: Secondary | ICD-10-CM | POA: Diagnosis not present

## 2021-04-07 DIAGNOSIS — I674 Hypertensive encephalopathy: Secondary | ICD-10-CM

## 2021-04-07 MED ORDER — DULOXETINE HCL 30 MG PO CPEP: 30.0000 mg | ORAL_CAPSULE | Freq: Every day | ORAL | 1 refills | Status: DC

## 2021-04-07 MED ORDER — LOVASTATIN 20 MG PO TABS: 20.0000 mg | ORAL_TABLET | Freq: Every day | ORAL | 1 refills | Status: DC

## 2021-04-07 NOTE — Assessment & Plan Note (Addendum)
Stable. Some side effects of Trazodone discussed as well as the risk of interaction with Duloxetine. He could try to decrease Trazodone dose from 50 mg to 25 mg. Good sleep hygiene.

## 2021-04-07 NOTE — Assessment & Plan Note (Addendum)
BP adequately controlled. Continue Benicar 20 mg daily and Amlodipine 2.5 mg daily. Low salt diet.

## 2021-04-07 NOTE — Assessment & Plan Note (Addendum)
LDL goal < 70. We discussed benefits of statins. He agrees with starting Lovastatin 20 mg daily.

## 2021-04-07 NOTE — Progress Notes (Signed)
HPI:  Richard Vaughn is a 70 y.o. male, who is here today with his wife for 4-5 months follow up.   He was last seen on 12/09/20.  Insomnia: Getting about 6-9 hours of sleep. He does not feel rested the next day. He is on Trazodone 50 mg daily at bedtime. OSA , he is not wearing his CPAP.  HTN: He is on Benicar 20 mg daily and Amlodipine 2.5 mg daily. Negative for severe/frequent headache, visual changes, chest pain, dyspnea, palpitation, new focal weakness, or edema.  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 PT helped.  He would like referral for aquatic therapy. His wife called health insurance and was told she needed a prescription.  Lab Results  Component Value Date   CREATININE 0.93 12/09/2020   BUN 12 12/09/2020   NA 139 12/09/2020   K 3.9 12/09/2020   CL 105 12/09/2020   CO2 29 12/09/2020   HLD: He is not on pharmacologic treatment.  Lab Results  Component Value Date   CHOL 139 12/09/2020   HDL 40.30 12/09/2020   LDLCALC 85 12/09/2020   TRIG 68.0 12/09/2020   CHOLHDL 3 12/09/2020   Unstable gait, aggravated by chronic back pain. He uses a cane. He has had 2 falls since his last visit, at home. No serious injury.  Depression: He feels like it is getting worse. Exacerbated by all his chronic medical conditions and limitations in regard to IADL's and some ADL's. He feels "useless." He arranged appt for CBT.  He has thought about being better off death but has not had suicidal ideation or made a plan. Chronic pain disorder on opioid treatment, he follows with pain management. Generalized arthralgias and back pain.  Depression screen [facility] 2/9 04/07/2021 12/09/2020 07/10/2018  Decreased Interest 1 0 0  Down, Depressed, Hopeless 3 0 0  PHQ - 2 Score 4 0 0  Altered sleeping 0 - -  Tired, decreased energy 1 - -  Change in appetite 3 - -  Feeling bad or failure about yourself  2 - -  Trouble concentrating 0 - -  Moving slowly or  fidgety/restless 0 - -  Suicidal thoughts 1 - -  PHQ-9 Score 11 - -  Difficult doing work/chores Somewhat difficult - -  Some recent data might be hidden   GAD 7 : Generalized Anxiety Score 04/07/2021  Nervous, Anxious, on Edge 1  Control/stop worrying 1  Worry too much - different things 1  Trouble relaxing 1  Restless 1  Easily annoyed or irritable 0  Afraid - awful might happen 1  Total GAD 7 Score 6  Anxiety Difficulty Somewhat difficult   Review of Systems  Constitutional: Positive for fatigue. Negative for activity change, appetite change and fever.  HENT: Negative for mouth sores, nosebleeds and sore throat.   Respiratory: Negative for cough and wheezing.   Gastrointestinal: Negative for abdominal pain, nausea and vomiting.  Endocrine: Negative for cold intolerance and heat intolerance.  Genitourinary: Negative for decreased urine volume, dysuria and hematuria.  Musculoskeletal: Positive for arthralgias, back pain and gait problem.  Neurological: Negative for syncope, facial asymmetry and weakness.  Psychiatric/Behavioral: Negative for confusion. The patient is nervous/anxious.   Rest of ROS, see pertinent positives sand negatives in HPI  Current Outpatient Medications on File Prior to Visit  Medication Sig Dispense Refill  . amLODipine (NORVASC) 2.5 MG tablet Take 1 tablet (2.5 mg total) by mouth daily. 90 tablet 3  . aspirin EC  81 MG tablet Take 1 tablet (81 mg total) by mouth daily. Swallow whole. 30 tablet 11  . esomeprazole (NEXIUM) 40 MG capsule TAKE 1 CAPSULE (40 MG TOTAL) BY MOUTH 2 (TWO) TIMES DAILY BEFORE A MEAL. 60 capsule 2  . olmesartan (BENICAR) 20 MG tablet Take 1 tablet (20 mg total) by mouth daily. 90 tablet 3  . oxyCODONE-acetaminophen (PERCOCET) 5-325 MG tablet Take 1-2 tablets every 4 hours as needed for post operative pain. MAX 6/day 30 tablet 0  . oxymorphone (OPANA) 10 MG tablet Take 10 mg by mouth in the morning and at bedtime.    . sucralfate  (CARAFATE) 1 GM/10ML suspension Take 10 mLs (1 g total) by mouth every 6 (six) hours as needed. 420 mL 1  . tiZANidine (ZANAFLEX) 4 MG tablet Take 1 tablet (4 mg total) by mouth every 8 (eight) hours as needed for muscle spasms. 30 tablet 1  . traZODone (DESYREL) 50 MG tablet Take 1 tablet (50 mg total) by mouth at bedtime as needed for sleep. 90 tablet 2   No current facility-administered medications on file prior to visit.   Past Medical History:  Diagnosis Date  . Barrett's esophagus   . Chronic back pain   . Depression   . Gastritis    Mild  . Gastroparesis   . GERD (gastroesophageal reflux disease)   . Headache   . History of kidney stones   . Hyperglycemia   . Hypertension   . Nephrolithiasis   . Pneumonia    Covid pneumonia  . Renal cyst   . Stricture and stenosis of esophagus   . Stroke ([place]) 07-2015  . Vision abnormalities    No Known Allergies  Social History   Socioeconomic History  . Marital status: Married    Spouse name: Richard Vaughn  . Number of children: 3  . Years of education: Not on file  . Highest education level: Not on file  Occupational History  . Occupation: retired    Fish farm manager: [employer]  Tobacco Use  . Smoking status: Former Smoker    Packs/day: 0.50    Years: 30.00    Pack years: 15.00    Types: Cigarettes    Quit date: 04/15/2018    Years since quitting: 2.9  . Smokeless tobacco: Never Used  Vaping Use  . Vaping Use: Never used  Substance and Sexual Activity  . Alcohol use: No    Alcohol/week: 0.0 standard drinks  . Drug use: Never    Comment: last use 04/2018  . Sexual activity: Yes  Other Topics Concern  . Not on file  Social History Narrative  . Not on file   Social Determinants of Health   Financial Resource Strain: Not on file  Food Insecurity: Not on file  Transportation Needs: Not on file  Physical Activity: Not on file  Stress: Not on file  Social Connections: Not on file   Vitals:   04/07/21 1003  BP:  126/80  Pulse: 67  Resp: 16  SpO2: 98%   Wt Readings from Last 3 Encounters:  04/07/21 229 lb 6 oz (104 kg)  12/09/20 235 lb 2 oz (106.7 kg)  08/14/20 235 lb (106.6 kg)   Body mass index is 29.45 kg/m.  Physical Exam Nursing note reviewed.  Constitutional:      General: He is not in acute distress.    Appearance: He is well-developed.  HENT:     Head: Normocephalic and atraumatic.  Eyes:  Conjunctiva/sclera: Conjunctivae normal.  Cardiovascular:     Rate and Rhythm: Normal rate and regular rhythm.     Pulses:          Dorsalis pedis pulses are 2+ on the right side and 2+ on the left side.     Heart sounds: No murmur heard.   Pulmonary:     Effort: Pulmonary effort is normal. No respiratory distress.     Breath sounds: Normal breath sounds.  Abdominal:     Palpations: Abdomen is soft. There is no hepatomegaly or mass.     Tenderness: There is no abdominal tenderness.  Lymphadenopathy:     Cervical: No cervical adenopathy.  Skin:    General: Skin is warm.     Findings: No erythema or rash.  Neurological:     Mental Status: He is alert and oriented to person, place, and time.     Comments: Unstable gait assisted with a cane.  Psychiatric:        Mood and Affect: Mood is depressed.        Speech: Speech is slurred.        Thought Content: Thought content does not include suicidal ideation. Thought content does not include suicidal plan.     Comments: Well groomed, good eye contact.   ASSESSMENT AND PLAN:  Richard Vaughn was seen today for 4-5 months follow-up.  Diagnoses and all orders for this visit:  Chronic pain syndrome Pain is not well controlled. Continue following with pain management. Duloxetine started today.  -     DULoxetine (CYMBALTA) 30 MG capsule; Take 1 capsule (30 mg total) by mouth daily.  Depressive reaction Getting worse. We discussed treatment options, because chronic back pain and OA, I think he may benefit from Duloxetine. He  agrees with starting Duloxetine 30 mg daily. Instructed about warning signs. CBT to start Friday.  -     DULoxetine (CYMBALTA) 30 MG capsule; Take 1 capsule (30 mg total) by mouth daily.  Frequent falls Fall precautions discussed. PT recommended, Rx given for aquatic therapy.  Hyperlipidemia with target LDL less than 70 LDL goal < 70. We discussed benefits of statins. He agrees with starting Lovastatin 20 mg daily.  Systolic hypertension with cerebrovascular disease BP adequately controlled. Continue Benicar 20 mg daily and Amlodipine 2.5 mg daily. Low salt diet.   Insomnia Stable. Some side effects of Trazodone discussed as well as the risk of interaction with Duloxetine. He could try to decrease Trazodone dose from 50 mg to 25 mg. Good sleep hygiene.  Spent 40 minutes.  During this time history was obtained and documented, examination was performed, prior labs reviewed, and assessment/plan discussed.  Return in about 4 weeks (around 05/05/2021).  Mariko Nowakowski G. Martinique, MD  [facility]. [place] office.  A few things to remember from today's visit:   Hyperlipidemia with target LDL less than 70  Systolic hypertension with cerebrovascular disease  Insomnia, unspecified type  CVD (cardiovascular disease)  Chronic pain syndrome - Plan: DULoxetine (CYMBALTA) 30 MG capsule  Depressive reaction - Plan: DULoxetine (CYMBALTA) 30 MG capsule  If you need refills please call your pharmacy. Do not use My Chart to request refills or for acute issues that need immediate attention.   Duloxetine 30 mg started today for depression. Medication may also help with pain. For now continue Trazodone, try to decrease it to 1/2 tab due to risk of interaction. Psychotherapy and regular exercise will also help.  Please be sure medication list  is accurate. If a new problem present, please set up appointment sooner than planned today.

## 2021-04-07 NOTE — Patient Instructions (Addendum)
A few things to remember from today's visit:   Hyperlipidemia with target LDL less than 70  Systolic hypertension with cerebrovascular disease  Insomnia, unspecified type  CVD (cardiovascular disease)  Chronic pain syndrome - Plan: DULoxetine (CYMBALTA) 30 MG capsule  Depressive reaction - Plan: DULoxetine (CYMBALTA) 30 MG capsule  If you need refills please call your pharmacy. Do not use My Chart to request refills or for acute issues that need immediate attention.   Duloxetine 30 mg started today for depression. Medication may also help with pain. For now continue Trazodone, try to decrease it to 1/2 tab due to risk of interaction. Psychotherapy and regular exercise will also help.  Please be sure medication list is accurate. If a new problem present, please set up appointment sooner than planned today.

## 2021-04-10 ENCOUNTER — Ambulatory Visit: Payer: Medicare Other | Admitting: Psychologist

## 2021-04-13 ENCOUNTER — Encounter: Payer: Self-pay | Admitting: Family Medicine

## 2021-04-16 DIAGNOSIS — M75122 Complete rotator cuff tear or rupture of left shoulder, not specified as traumatic: Secondary | ICD-10-CM | POA: Diagnosis not present

## 2021-04-21 DIAGNOSIS — M75122 Complete rotator cuff tear or rupture of left shoulder, not specified as traumatic: Secondary | ICD-10-CM | POA: Diagnosis not present

## 2021-04-24 ENCOUNTER — Ambulatory Visit (HOSPITAL_BASED_OUTPATIENT_CLINIC_OR_DEPARTMENT_OTHER): Payer: Medicare Other | Attending: Family Medicine | Admitting: Physical Therapy

## 2021-04-24 ENCOUNTER — Other Ambulatory Visit: Payer: Self-pay

## 2021-04-24 ENCOUNTER — Encounter (HOSPITAL_BASED_OUTPATIENT_CLINIC_OR_DEPARTMENT_OTHER): Payer: Self-pay | Admitting: Physical Therapy

## 2021-04-24 DIAGNOSIS — M6281 Muscle weakness (generalized): Secondary | ICD-10-CM | POA: Diagnosis not present

## 2021-04-24 DIAGNOSIS — R2689 Other abnormalities of gait and mobility: Secondary | ICD-10-CM | POA: Diagnosis not present

## 2021-04-24 DIAGNOSIS — R2681 Unsteadiness on feet: Secondary | ICD-10-CM | POA: Insufficient documentation

## 2021-04-24 NOTE — Therapy (Signed)
[facility] [address] [place], [place], [address] Phone: [phone]   Fax:  [phone]  Physical Therapy Evaluation  Patient Details  Name: Richard Vaughn MRN: [id number] Date of Birth: 1950/12/29 Referring Provider (PT): Dr Betty Martinique   Encounter Date: 04/24/2021   PT End of Session - 04/24/21 1318     Visit Number 1    Number of Visits 16    Date for PT Re-Evaluation 06/19/21    Authorization Type Medicare A and B    Activity Tolerance Patient tolerated treatment well    Behavior During Therapy [facility] for tasks assessed/performed             Past Medical History:  Diagnosis Date   Barrett's esophagus    Chronic back pain    Depression    Gastritis    Mild   Gastroparesis    GERD (gastroesophageal reflux disease)    Headache    History of kidney stones    Hyperglycemia    Hypertension    Nephrolithiasis    Pneumonia    Covid pneumonia   Renal cyst    Stricture and stenosis of esophagus    Stroke [facility]) 07-2015   Vision abnormalities     Past Surgical History:  Procedure Laterality Date   CERVICAL [place] SURGERY  2012   COLONOSCOPY     LUMBAR [place] SURGERY  2005, 2010   replaced L4 and L5   SHOULDER ARTHROSCOPY WITH ROTATOR CUFF REPAIR Left 08/14/2020   Procedure: SHOULDER ARTHROSCOPY WITH ROTATOR CUFF REPAIR, SUBACROMIAL DECOMPRESSION;  Surgeon: Tania Ade, MD;  Location: [facility];  Service: Orthopedics;  Laterality: Left;   UPPER GASTROINTESTINAL ENDOSCOPY      There were no vitals filed for this visit.    Subjective Assessment - 04/24/21 1308     Subjective Patient had a CVA in 2021. Since that point he has had limited use of his left arm. He also had a RTC surgery in Septemeber of last year.    Pertinent History CVA, Lower Back Pain    How long can you stand comfortably? limited time    How long can you walk comfortably? limited community distances    Diagnostic tests nothing pertinant  sisnce before his shoulder surgery    Patient Stated Goals to become more mobile and get back to normal as much as able    Currently in Pain? Yes    Pain Score 5     Pain Location Back    Pain Orientation Mid;Lower    Pain Descriptors / Indicators Aching    Pain Type Chronic pain    Pain Radiating Towards pain radiates down the right leg.    Pain Onset 1 to 4 weeks ago    Pain Frequency Constant    Aggravating Factors  standing and walking    Pain Relieving Factors sitting down    Effect of Pain on Daily Activities decreases ability to move                [facility] PT Assessment - 04/24/21 0001       Assessment   Medical Diagnosis Decreased mobility following a CVA    Referring Provider (PT) Dr Betty Martinique    Hand Dominance Right    Next MD Visit 05/05/2021    Prior Therapy Had therapy following his stroke last year      Precautions   Precautions Fall    Precaution Comments using a cane in his right  hand      Restrictions   Weight Bearing Restrictions No      Balance Screen   Has the patient fallen in the past 6 months Yes    How many times? 4   One time was the dog; one time was on steps, one time on the tredmill; 1 time was falling off the chair;   Has the patient had a decrease in activity level because of a fear of falling?  Yes    Is the patient reluctant to leave their home because of a fear of falling?  Yes      [place] residence    Living Arrangements Spouse/significant other    Available Help at Discharge Family    Type of [place] to enter    Additional Comments 3 steps into the house and a set into the basement      Prior Function   Level of [place] Retired    Leisure like to walk      Cognition   Overall Cognitive Status Within Functional Limits for tasks assessed    Attention Focused    Focused Attention Appears intact    Memory Appears intact    Awareness  Appears intact    Problem Solving Appears intact      Observation/Other Assessments   Observations stands with arm in waiters tip position behind thee back      Sensation   Light Touch Appears Intact    Additional Comments decreased sensation in the left UE      Coordination   Gross Motor Movements are Fluid and Coordinated Yes    Fine Motor Movements are Fluid and Coordinated Yes      ROM / Strength   AROM / PROM / Strength AROM;PROM;Strength      AROM   Overall AROM Comments no active shoulder flexion. Difficult to assess if this is because of the stroke or hisRTC surgery      Strength   Strength Assessment Site Lumbar;Hip    Right/Left Hip Left;Right    Right Hip Flexion 4+/5    Right Hip ABduction 5/5    Right Hip ADduction 5/5    Left Hip Flexion 4/5    Left Hip ABduction 4/5    Left Hip ADduction 4+/5      Palpation   Palpation comment spasming in bilateral paraspinals      Ambulation/Gait   Gait Comments walks with cane in right hand. Decreased right single leg stance time; mild lateral motion to the right.      Balance   Balance Assessed Yes      Berg Balance Test   Sit to Stand Able to stand  independently using hands    Standing Unsupported Able to stand 2 minutes with supervision    Sitting with Back Unsupported but Feet Supported on Floor or Stool Able to sit safely and securely 2 minutes    Stand to Sit Uses backs of legs against chair to control descent    Transfers Able to transfer with verbal cueing and /or supervision    Standing Unsupported with Eyes Closed Able to stand 10 seconds with supervision    Standing Unsupported with Feet Together Able to place feet together independently and stand for 1 minute with supervision    From Standing, Reach Forward with Outstretched Arm Can reach forward >5 cm safely (2")    From  Standing Position, Pick up Object from Floor Able to pick up shoe, needs supervision    From Standing Position, Turn to Look Behind  Over each Shoulder Looks behind one side only/other side shows less weight shift    Turn 360 Degrees Able to turn 360 degrees safely but slowly    Standing Unsupported, Alternately Place Feet on Step/Stool Needs assistance to keep from falling or unable to try    Standing Unsupported, One Foot in [employer] balance while stepping or standing    Standing on One Leg Unable to try or needs assist to prevent fall    Total Score 30                        Objective measurements completed on examination: See above findings.               PT Education - 04/24/21 1317     Education Details reviewed basic HEP and symptom mangement    Person(s) Educated Patient    Methods Explanation;Demonstration;Tactile cues;Verbal cues    Comprehension Verbalized understanding;Returned demonstration;Verbal cues required;Tactile cues required              PT Short Term Goals - 04/24/21 1722       PT SHORT TERM GOAL #1   Title Patient will increase BERG test to 36/56    Time 3    Period Weeks    Status New    Target Date 06/05/21      PT SHORT TERM GOAL #2   Title Patient will increase left LE strength to 5/5    Time 3    Period Weeks    Status New    Target Date 05/15/21      PT SHORT TERM GOAL #3   Title Patient will demonstrate 20 degrees of left shoulder flexion ( may not be potential but we will perfrom light strengthening in the pool)    Time 3    Period Weeks    Status New    Target Date 05/15/21               PT Long Term Goals - 04/24/21 1727       PT LONG TERM GOAL #1   Title Patient will be indepdnent with a full pool program they can carryover at the [facility]    Time 8    Period Weeks    Status New    Target Date 06/19/21      PT LONG TERM GOAL #2   Title Pt will increase Berg from 44/56 or more for improved balance and decreased fall risk.    Time 6    Period Weeks    Status On-going      PT LONG TERM GOAL #3   Title Patient will  mabualte 1000' in the community without loss of balance in order to improve safety    Time 6    Period Weeks    Status New    Target Date 06/05/21                    Plan - 04/24/21 1350     Clinical Impression Statement Patient is a 70 year old male with decreased mobility following a CVA and RTC repair. He has limited functional use of his left shoulder. He does however have limited use of his hand and elbow. It is questionable how much of his shoulder is due to the RTC  and how much is due to the stroke. He has chronic lower back pain with a history of 2 spinal surgeries the last being in 2010. He has spasming in bilateral paraspinals and pain radiating into the right leg. He reports increased back and leg pain when he walks more. He  has strength deficits on the left LE. He would benefit from skilled therapy to improve ability to ambualte He hand his wife were very active prior to the CVA and he hopes to return to that level. BERG balance testing puts him at    Stability/Clinical Decision Making Evolving/Moderate complexity    Clinical Decision Making Moderate    Rehab Potential Good    PT Frequency 2x / week    PT Duration 8 weeks    PT Treatment/Interventions ADLs/Self Care Home Management;Aquatic Therapy;Electrical Stimulation;Cryotherapy;Moist Heat;Ultrasound;Gait training;Functional mobility training;Therapeutic activities;Therapeutic exercise;Neuromuscular re-education;Patient/family education;Manual techniques;Passive range of motion;Spinal Manipulations    PT Next Visit Plan Patient did well with static balance. We will havve to work more on dynamic balance in the ool. Consider balance with pertabations, consider step ups and other higher level pool activity. Review thera-cane at some point.    PT Home Exercise Plan Patient was given a tennis ball. BERG testing took most of the visit. He is already doing some light exercises at home.    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) 10/25/2018   Chronic pain syndrome    Chronic bilateral low back pain    Benign essential HTN    Tobacco abuse    Marijuana abuse    Hyperlipidemia    History of CVA with residual deficit    Dysphagia, post-stroke    ICH (intracerebral hemorrhage) ([place]) - R thalmic/PLIC d/t HTN 91/47/8295   Insomnia 07/13/2017   PAD (peripheral artery disease) ([place]) 07/13/2017   Hyperlipidemia with target LDL less than 70 07/13/2017   Stroke-like symptom 09/22/2016   Paresthesia 09/22/2016   CVA (cerebral vascular accident) ([place]) 09/22/2016   Cerebrovascular accident (CVA) due to thrombosis of left carotid artery ([place]) 09/23/2015   Primary snoring 09/23/2015   Hypersomnia with sleep apnea 09/23/2015   Obstructive sleep apnea 09/23/2015   Lacunar infarct, acute ([place]) 08/25/2015   Sleep apnea 08/25/2015   Dysarthria    CVD (cardiovascular disease) 08/02/2015   Acute hyperglycemia 62/13/0865   Systolic hypertension with cerebrovascular disease 12/27/2014   Epistaxis 07/28/2012   HYPERGLYCEMIA 10/02/2010   NEPHROLITHIASIS 05/23/2009   BARRETTS ESOPHAGUS 02/20/2009   Gastroparesis 02/20/2009   RADICULOPATHY 10/21/2008   GERD 10/08/2008   ESOPHAGITIS 08/15/2008   ESOPHAGEAL STRICTURE 08/15/2008   GASTRITIS, ACUTE 08/15/2008   RENAL CYST 08/14/2008   TOBACCO ABUSE 08/08/2008   HEMATURIA,  MICROSCOPIC, HX OF 08/08/2008   PULMONARY NODULE 01/10/2008    Carney Living PT DPT  04/24/2021, 5:29 PM  [facility] [address] [place], [place], [address] Phone: [phone]   Fax:  [phone]  Name: Richard Vaughn MRN: [id number] Date of Birth: 26-Sep-1951

## 2021-04-27 ENCOUNTER — Ambulatory Visit (HOSPITAL_BASED_OUTPATIENT_CLINIC_OR_DEPARTMENT_OTHER): Payer: Medicare Other | Admitting: Physical Therapy

## 2021-04-27 ENCOUNTER — Encounter (HOSPITAL_BASED_OUTPATIENT_CLINIC_OR_DEPARTMENT_OTHER): Payer: Self-pay | Admitting: Physical Therapy

## 2021-04-27 ENCOUNTER — Other Ambulatory Visit: Payer: Self-pay

## 2021-04-27 DIAGNOSIS — I69354 Hemiplegia and hemiparesis following cerebral infarction affecting left non-dominant side: Secondary | ICD-10-CM

## 2021-04-27 DIAGNOSIS — R2681 Unsteadiness on feet: Secondary | ICD-10-CM

## 2021-04-27 DIAGNOSIS — R2689 Other abnormalities of gait and mobility: Secondary | ICD-10-CM

## 2021-04-27 DIAGNOSIS — M6281 Muscle weakness (generalized): Secondary | ICD-10-CM

## 2021-04-27 NOTE — Therapy (Signed)
[facility] [address] [place], [place], [address] Phone: [phone]   Fax:  [phone]  Physical Therapy Treatment  Patient Details  Name: Richard Vaughn MRN: [id number] Date of Birth: Nov 25, 1950 Referring Provider (PT): Dr Betty Martinique   Encounter Date: 04/27/2021   PT End of Session - 04/27/21 1122     Visit Number 2    Number of Visits 16    Date for PT Re-Evaluation 06/19/21    Authorization Type Medicare A and B    PT Start Time 0845    PT Stop Time 0930    PT Time Calculation (min) 45 min    Equipment Utilized During Treatment Other (comment)   Ankle cuff and waist buoys, kick board, noodle/sqoodle, nekdoodle, weights and barbells   Activity Tolerance Patient tolerated treatment well    Behavior During Therapy WFL for tasks assessed/performed             Past Medical History:  Diagnosis Date   Barrett's esophagus    Chronic back pain    Depression    Gastritis    Mild   Gastroparesis    GERD (gastroesophageal reflux disease)    Headache    History of kidney stones    Hyperglycemia    Hypertension    Nephrolithiasis    Pneumonia    Covid pneumonia   Renal cyst    Stricture and stenosis of esophagus    Stroke [facility]) 07-2015   Vision abnormalities     Past Surgical History:  Procedure Laterality Date   CERVICAL [place] SURGERY  2012   COLONOSCOPY     LUMBAR [place] SURGERY  2005, 2010   replaced L4 and L5   SHOULDER ARTHROSCOPY WITH ROTATOR CUFF REPAIR Left 08/14/2020   Procedure: SHOULDER ARTHROSCOPY WITH ROTATOR CUFF REPAIR, SUBACROMIAL DECOMPRESSION;  Surgeon: Tania Ade, MD;  Location: [facility];  Service: Orthopedics;  Laterality: Left;   UPPER GASTROINTESTINAL ENDOSCOPY      There were no vitals filed for this visit.   Subjective Assessment - 04/27/21 1121     Subjective I am hoping you can help my balance so I can get around better.            Pt seen for aquatic therapy today.   Treatment took place in water 3.25-4.8 ft in depth at the [facility] pool. Temp of water was 92.  Pt entered/exited the pool via stairs (step through pattern) independently with bilat rail.  Warm up  Water walking forward, backward and side stepping/walking cues for increased step length, increased speed, hand placement to increase resistance. Pt requires HHA initially for balance and to decrease apprehension of balance ability.  Seated Stretching of gastroc, hamstrings, glutes and LB on wall and sitting on bench of pool 70% submerged LE strengthening knee and hip all planes with ankle buoy 2 x 10 reps. PT assisting with stabilizing on bench. LUE resisted add and shld flex with barbell  Standing Using cuff buoys hip abd/add, hip flex 2 x 10 reps. Cuing for abdominal and glut tightening throughout. Noodle kick downs holding to pool 2 x 10 reps. Progressed holding rue to barbell requiring cga for balance  Pt requires buoyancy for support and to offload joints with strengthening exercises. Viscosity of the water is needed for resistance of strengthening; water current perturbations provides challenge to standing balance unsupported, requiring increased core activation.  PT Education - 04/27/21 1121     Education Details benefits of aquatic setting.    Person(s) Educated Patient;Spouse    Methods Explanation;Demonstration    Comprehension Verbalized understanding              PT Short Term Goals - 04/24/21 1722       PT SHORT TERM GOAL #1   Title Patient will increase BERG test to 36/56    Time 3    Period Weeks    Status New    Target Date 06/05/21      PT SHORT TERM GOAL #2   Title Patient will increase left LE strength to 5/5    Time 3    Period Weeks    Status New    Target Date 05/15/21      PT SHORT TERM GOAL #3   Title Patient will demonstrate 20 degrees of left shoulder flexion ( may not be potential  but we will perfrom light strengthening in the pool)    Time 3    Period Weeks    Status New    Target Date 05/15/21               PT Long Term Goals - 04/24/21 1727       PT LONG TERM GOAL #1   Title Patient will be indepdnent with a full pool program they can carryover at the [facility]    Time 8    Period Weeks    Status New    Target Date 06/19/21      PT LONG TERM GOAL #2   Title Pt will increase Berg from 44/56 or more for improved balance and decreased fall risk.    Time 6    Period Weeks    Status On-going      PT LONG TERM GOAL #3   Title Patient will mabualte 1000' in the community without loss of balance in order to improve safety    Time 6    Period Weeks    Status New    Target Date 06/05/21                   Plan - 04/27/21 1124     Clinical Impression Statement Pt comfortable in aquatic setting.  Some difficulty with gaining standing balance intitially but improved as session continued.  Initiated Lue,Le's and core stretching and strengthening.  Pt frustrates easily.  He is encouraged to work through the movements with concentration on body awareness. He is guided through core stabiization and progressed to simple balance challenges.  He does c/o some increase in left shld discomfort.    Stability/Clinical Decision Making Evolving/Moderate complexity    Clinical Decision Making Moderate    Rehab Potential Good    PT Frequency 2x / week    PT Duration 8 weeks    PT Treatment/Interventions ADLs/Self Care Home Management;Aquatic Therapy;Electrical Stimulation;Cryotherapy;Moist Heat;Ultrasound;Gait training;Functional mobility training;Therapeutic activities;Therapeutic exercise;Neuromuscular re-education;Patient/family education;Manual techniques;Passive range of motion;Spinal Manipulations    PT Next Visit Plan Advance balance challenges    PT Home Exercise Plan Access Code: [email or web address]  URL: [email or web address]  Date: 04/27/2021  Prepared  by: Denton Meek    Exercises  Backward Walking - 1 x daily - 7 x weekly - 3 sets - 10 reps  Forward March - 1 x daily - 7 x weekly - 3 sets - 10 reps  Side Stepping - 1 x daily - 7 x weekly - 3 sets - 10 reps  Jumping Jacks - 1 x daily - 7 x weekly - 3 sets - 10 reps  Standing Hip Circles at [employer] - 1 x daily - 7 x weekly - 3 sets - 10 reps             Patient will benefit from skilled therapeutic intervention in order to improve the following deficits and impairments:  Decreased strength, Difficulty walking, Decreased balance, Decreased coordination  Visit Diagnosis: Unsteadiness on feet  Muscle weakness (generalized)  Hemiplegia and hemiparesis following cerebral infarction affecting left non-dominant side (HCC)  Other abnormalities of gait and mobility     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) 10/25/2018   Chronic pain syndrome    Chronic bilateral low back pain    Benign essential HTN    Tobacco abuse    Marijuana abuse    Hyperlipidemia    History of CVA with residual deficit    Dysphagia, post-stroke    ICH (intracerebral hemorrhage) ([place]) - R thalmic/PLIC d/t HTN 33/61/2244   Insomnia 07/13/2017   PAD (peripheral artery disease) ([place]) 07/13/2017   Hyperlipidemia with target LDL less than 70 07/13/2017   Stroke-like symptom 09/22/2016   Paresthesia 09/22/2016   CVA (cerebral vascular accident) ([place]) 09/22/2016   Cerebrovascular accident (CVA) due to thrombosis of left carotid artery ([place]) 09/23/2015   Primary snoring 09/23/2015   Hypersomnia with sleep apnea 09/23/2015   Obstructive sleep apnea 09/23/2015   Lacunar infarct, acute ([place]) 08/25/2015   Sleep  apnea 08/25/2015   Dysarthria    CVD (cardiovascular disease) 08/02/2015   Acute hyperglycemia 97/53/0051   Systolic hypertension with cerebrovascular disease 12/27/2014   Epistaxis 07/28/2012   HYPERGLYCEMIA 10/02/2010   NEPHROLITHIASIS 05/23/2009   BARRETTS ESOPHAGUS 02/20/2009   Gastroparesis 02/20/2009   RADICULOPATHY 10/21/2008   GERD 10/08/2008   ESOPHAGITIS 08/15/2008   ESOPHAGEAL STRICTURE 08/15/2008   GASTRITIS, ACUTE 08/15/2008   RENAL CYST 08/14/2008   TOBACCO ABUSE 08/08/2008   HEMATURIA, MICROSCOPIC, HX OF 08/08/2008   PULMONARY NODULE 01/10/2008    Macky Lower Conswella Bruney MPT 04/27/2021, 11:38 AM  [facility] [address] [place], [place], [address] Phone: [phone]   Fax:  [phone]  Name: Richard Vaughn MRN: [id number] Date of Birth: Mar 30, 1951

## 2021-05-01 ENCOUNTER — Ambulatory Visit (HOSPITAL_BASED_OUTPATIENT_CLINIC_OR_DEPARTMENT_OTHER): Payer: Medicare Other | Admitting: Physical Therapy

## 2021-05-01 ENCOUNTER — Other Ambulatory Visit: Payer: Self-pay

## 2021-05-01 DIAGNOSIS — R2681 Unsteadiness on feet: Secondary | ICD-10-CM

## 2021-05-01 DIAGNOSIS — M6281 Muscle weakness (generalized): Secondary | ICD-10-CM

## 2021-05-01 DIAGNOSIS — R2689 Other abnormalities of gait and mobility: Secondary | ICD-10-CM | POA: Diagnosis not present

## 2021-05-02 ENCOUNTER — Encounter (HOSPITAL_BASED_OUTPATIENT_CLINIC_OR_DEPARTMENT_OTHER): Payer: Self-pay | Admitting: Physical Therapy

## 2021-05-02 NOTE — Therapy (Signed)
[facility] [address] [place], [place], [address] Phone: [phone]   Fax:  [phone]  Physical Therapy Treatment  Patient Details  Name: Richard Vaughn MRN: [id number] Date of Birth: [birth year] Referring Provider (PT): Dr Betty Martinique   Encounter Date: 05/01/2021   PT End of Session - 05/02/21 2101     Visit Number 3    Number of Visits 16    Date for PT Re-Evaluation 06/19/21    Authorization Type Medicare A and B    PT Start Time 4481    PT Stop Time 1058    PT Time Calculation (min) 43 min    Activity Tolerance Patient tolerated treatment well    Behavior During Therapy [facility] for tasks assessed/performed             Past Medical History:  Diagnosis Date   Barrett's esophagus    Chronic back pain    Depression    Gastritis    Mild   Gastroparesis    GERD (gastroesophageal reflux disease)    Headache    History of kidney stones    Hyperglycemia    Hypertension    Nephrolithiasis    Pneumonia    Covid pneumonia   Renal cyst    Stricture and stenosis of esophagus    Stroke [facility]) 07-2015   Vision abnormalities     Past Surgical History:  Procedure Laterality Date   CERVICAL [place] SURGERY  2012   COLONOSCOPY     LUMBAR [place] SURGERY  2005, 2010   replaced L4 and L5   SHOULDER ARTHROSCOPY WITH ROTATOR CUFF REPAIR Left 08/14/2020   Procedure: SHOULDER ARTHROSCOPY WITH ROTATOR CUFF REPAIR, SUBACROMIAL DECOMPRESSION;  Surgeon: Tania Ade, MD;  Location: [facility];  Service: Orthopedics;  Laterality: Left;   UPPER GASTROINTESTINAL ENDOSCOPY      There were no vitals filed for this visit.   Subjective Assessment - 05/02/21 2058     Subjective Patient has no complaints. he reports doing well after the last visit. He reports his back isn't hurting that bad    Pertinent History CVA, Lower Back Pain    How long can you stand comfortably? limited time    How long can you walk comfortably? limited  community distances    Diagnostic tests nothing pertinant sisnce before his shoulder surgery    Patient Stated Goals to become more mobile and get back to normal as much as able    Currently in Pain? Yes    Pain Score 5     Pain Location Back    Pain Orientation Lower    Pain Descriptors / Indicators Aching    Pain Type Chronic pain    Pain Onset 1 to 4 weeks ago    Pain Frequency Intermittent    Aggravating Factors  standing and walking    Pain Relieving Factors sitting down    Effect of Pain on Daily Activities decreased ability to move                                       PT Education - 05/02/21 2100     Education Details reviewed balance exercises    Person(s) Educated Patient    Methods Explanation;Demonstration;Tactile cues;Verbal cues    Comprehension Verbalized understanding;Returned demonstration;Verbal cues required;Tactile cues required              PT Short  Term Goals - 04/24/21 1722       PT SHORT TERM GOAL #1   Title Patient will increase BERG test to 36/56    Time 3    Period Weeks    Status New    Target Date 06/05/21      PT SHORT TERM GOAL #2   Title Patient will increase left LE strength to 5/5    Time 3    Period Weeks    Status New    Target Date 05/15/21      PT SHORT TERM GOAL #3   Title Patient will demonstrate 20 degrees of left shoulder flexion ( may not be potential but we will perfrom light strengthening in the pool)    Time 3    Period Weeks    Status New    Target Date 05/15/21               PT Long Term Goals - 04/24/21 1727       PT LONG TERM GOAL #1   Title Patient will be indepdnent with a full pool program they can carryover at the [facility]    Time 8    Period Weeks    Status New    Target Date 06/19/21      PT LONG TERM GOAL #2   Title Pt will increase Berg from 44/56 or more for improved balance and decreased fall risk.    Time 6    Period Weeks    Status On-going      PT LONG  TERM GOAL #3   Title Patient will mabualte 1000' in the community without loss of balance in order to improve safety    Time 6    Period Weeks    Status New    Target Date 06/05/21                   Plan - 05/02/21 2102     Clinical Impression Statement Patient tolerated treatment well. Therapy focused on strengthening and balance. We worked supported motion of his left shoulder. He has no active motion out of the water but in the water he can push his arm forward. Therapy reviewed self stretches with hi in the water.    Stability/Clinical Decision Making Evolving/Moderate complexity    Clinical Decision Making Moderate    Rehab Potential Good    PT Frequency 2x / week    PT Duration 8 weeks    PT Treatment/Interventions ADLs/Self Care Home Management;Aquatic Therapy;Electrical Stimulation;Cryotherapy;Moist Heat;Ultrasound;Gait training;Functional mobility training;Therapeutic activities;Therapeutic exercise;Neuromuscular re-education;Patient/family education;Manual techniques;Passive range of motion;Spinal Manipulations    PT Next Visit Plan Advance balance challenges    PT Home Exercise Plan Access Code: [email or web address]  URL: [email or web address]  Date: 04/27/2021  Prepared by: Denton Meek    Exercises  Backward Walking - 1 x daily - 7 x weekly - 3 sets - 10 reps  Forward March - 1 x daily - 7 x weekly - 3 sets - 10 reps  Side Stepping - 1 x daily - 7 x weekly - 3 sets - 10 reps  Jumping Jacks - 1 x daily - 7 x weekly - 3 sets - 10 reps  Standing Hip Circles at [employer] - 1 x daily - 7 x weekly - 3 sets - 10 reps    Consulted and Agree with Plan of Care Patient             Patient will benefit from skilled therapeutic intervention  in order to improve the following deficits and impairments:     Visit Diagnosis: Unsteadiness on feet  Muscle weakness (generalized)     Problem List Patient Active Problem List   Diagnosis Date Noted   Depressive reaction     Sleep disturbance    Urinary retention    Bradycardia    Essential hypertension    Temperature elevated    Left thyroid nodule    Treatment-emergent central sleep apnea 10/25/2018   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) 10/25/2018   Chronic pain syndrome    Chronic bilateral low back pain    Benign essential HTN    Tobacco abuse    Marijuana abuse    Hyperlipidemia    History of CVA with residual deficit    Dysphagia, post-stroke    ICH (intracerebral hemorrhage) ([place]) - R thalmic/PLIC d/t HTN 18/56/3149   Insomnia 07/13/2017   PAD (peripheral artery disease) ([place]) 07/13/2017   Hyperlipidemia with target LDL less than 70 07/13/2017   Stroke-like symptom 09/22/2016   Paresthesia 09/22/2016   CVA (cerebral vascular accident) ([place]) 09/22/2016   Cerebrovascular accident (CVA) due to thrombosis of left carotid artery ([place]) 09/23/2015   Primary snoring 09/23/2015   Hypersomnia with sleep apnea 09/23/2015   Obstructive sleep apnea 09/23/2015   Lacunar infarct, acute ([place]) 08/25/2015   Sleep apnea 08/25/2015   Dysarthria    CVD (cardiovascular disease) 08/02/2015   Acute hyperglycemia 70/26/3785   Systolic hypertension with cerebrovascular disease 12/27/2014   Epistaxis 07/28/2012   HYPERGLYCEMIA 10/02/2010   NEPHROLITHIASIS 05/23/2009   BARRETTS ESOPHAGUS 02/20/2009   Gastroparesis 02/20/2009   RADICULOPATHY 10/21/2008   GERD 10/08/2008   ESOPHAGITIS 08/15/2008   ESOPHAGEAL STRICTURE 08/15/2008   GASTRITIS, ACUTE 08/15/2008   RENAL CYST 08/14/2008   TOBACCO ABUSE 08/08/2008   HEMATURIA, MICROSCOPIC, HX OF 08/08/2008   PULMONARY NODULE 01/10/2008    Carney Living PT DPT 05/02/2021, 9:09 PM  [facility] [address] [place], [place], [address] Phone: [phone]   Fax:  [phone]  Name: Richard Vaughn MRN: [id number] Date of Birth: 1951/03/03

## 2021-05-04 DIAGNOSIS — M75122 Complete rotator cuff tear or rupture of left shoulder, not specified as traumatic: Secondary | ICD-10-CM | POA: Diagnosis not present

## 2021-05-05 ENCOUNTER — Other Ambulatory Visit: Payer: Self-pay

## 2021-05-05 ENCOUNTER — Encounter: Payer: Self-pay | Admitting: Family Medicine

## 2021-05-05 ENCOUNTER — Ambulatory Visit (INDEPENDENT_AMBULATORY_CARE_PROVIDER_SITE_OTHER): Payer: Medicare Other | Admitting: Family Medicine

## 2021-05-05 VITALS — BP 124/80 | HR 60 | Temp 98.0°F | Resp 16 | Ht 74.0 in | Wt 230.8 lb

## 2021-05-05 DIAGNOSIS — G894 Chronic pain syndrome: Secondary | ICD-10-CM

## 2021-05-05 DIAGNOSIS — G4700 Insomnia, unspecified: Secondary | ICD-10-CM

## 2021-05-05 DIAGNOSIS — F329 Major depressive disorder, single episode, unspecified: Secondary | ICD-10-CM | POA: Diagnosis not present

## 2021-05-05 MED ORDER — TRAZODONE HCL 50 MG PO TABS: 25.0000 mg | ORAL_TABLET | Freq: Every evening | ORAL | 2 refills | Status: DC | PRN

## 2021-05-05 MED ORDER — DULOXETINE HCL 60 MG PO CPEP: 60.0000 mg | ORAL_CAPSULE | Freq: Every day | ORAL | 1 refills | Status: DC

## 2021-05-05 NOTE — Patient Instructions (Addendum)
A few things to remember from today's visit:  Insomnia, unspecified type - Plan: traZODone (DESYREL) 50 MG tablet  Depressive reaction  Chronic pain syndrome  If you need refills please call your pharmacy. Do not use My Chart to request refills or for acute issues that need immediate attention.   Today we are increase Duloxetine from 30 mg to 60 mg. Trazodone decrease to 1/2 tab,eventually we will wean it off.  Keep appt with psychotherapist.  Please be sure medication list is accurate. If a new problem present, please set up appointment sooner than planned today.

## 2021-05-05 NOTE — Progress Notes (Signed)
HPI: [name] is a 70 y.o. male, who is here today with his wife to follow on recent OV. He was last seen on 04/07/21, when Cymbalta 30 mg was started to help with depression and chronic pain. In general he has tolerated medication well, no side effects.  He has not noted major improvement in symptoms.  Depression: Feels "down", intermittently, no maniac like symptoms. Problem is aggravated by physical limitations caused by CVA and chronic pain. Denies suicidal thoughts. His wife thinks symptoms have improve some. Appt with psychotherapist.  He is on chronic opioid treatment. OA and back pain, he uses a cane. No falls since his last visit. Started PT, aquatic exercises.  Follows with pain management.  Depression screen [facility] 2/9 04/07/2021 12/09/2020 07/10/2018  Decreased Interest 1 0 0  Down, Depressed, Hopeless 3 0 0  PHQ - 2 Score 4 0 0  Altered sleeping 0 - -  Tired, decreased energy 1 - -  Change in appetite 3 - -  Feeling bad or failure about yourself  2 - -  Trouble concentrating 0 - -  Moving slowly or fidgety/restless 0 - -  Suicidal thoughts 1 - -  PHQ-9 Score 11 - -  Difficult doing work/chores Somewhat difficult - -  Some recent data might be hidden   Insomnia: He is on Trazodone 50 mg daily at bedtime. Sleeping well. He does not feel rested when he gets up in the morning.  Review of Systems  Constitutional:  Positive for fatigue. Negative for activity change, appetite change and fever.  HENT:  Negative for mouth sores, nosebleeds and sore throat.   Respiratory:  Negative for cough, shortness of breath and wheezing.   Cardiovascular:  Negative for chest pain, palpitations and leg swelling.  Gastrointestinal:  Negative for abdominal pain, nausea and vomiting.       No changes in bowel habits.  Genitourinary:  Negative for decreased urine volume, dysuria and hematuria.  Musculoskeletal:  Positive for arthralgias and gait problem.  Skin:  Negative for  pallor and rash.  Neurological:  Negative for syncope and headaches.  Rest see pertinent positives and negatives per HPI.  Current Outpatient Medications on File Prior to Visit  Medication Sig Dispense Refill   amLODipine (NORVASC) 2.5 MG tablet Take 1 tablet (2.5 mg total) by mouth daily. 90 tablet 3   aspirin EC 81 MG tablet Take 1 tablet (81 mg total) by mouth daily. Swallow whole. 30 tablet 11   esomeprazole (NEXIUM) 40 MG capsule TAKE 1 CAPSULE (40 MG TOTAL) BY MOUTH 2 (TWO) TIMES DAILY BEFORE A MEAL. 60 capsule 2   lovastatin (MEVACOR) 20 MG tablet Take 1 tablet (20 mg total) by mouth at bedtime. 90 tablet 1   olmesartan (BENICAR) 20 MG tablet Take 1 tablet (20 mg total) by mouth daily. 90 tablet 3   oxyCODONE-acetaminophen (PERCOCET) 5-325 MG tablet Take 1-2 tablets every 4 hours as needed for post operative pain. MAX 6/day 30 tablet 0   oxymorphone (OPANA) 10 MG tablet Take 10 mg by mouth in the morning and at bedtime.     sucralfate (CARAFATE) 1 GM/10ML suspension Take 10 mLs (1 g total) by mouth every 6 (six) hours as needed. 420 mL 1   tiZANidine (ZANAFLEX) 4 MG tablet Take 1 tablet (4 mg total) by mouth every 8 (eight) hours as needed for muscle spasms. 30 tablet 1   No current facility-administered medications on file prior to visit.   Past Medical  History:  Diagnosis Date   Barrett's esophagus    Chronic back pain    Depression    Gastritis    Mild   Gastroparesis    GERD (gastroesophageal reflux disease)    Headache    History of kidney stones    Hyperglycemia    Hypertension    Nephrolithiasis    Pneumonia    Covid pneumonia   Renal cyst    Stricture and stenosis of esophagus    Stroke ([place]) 07-2015   Vision abnormalities    No Known Allergies  Social History   Socioeconomic History   Marital status: Married    Spouse name: Cleon Signorelli   Number of children: 3   Years of education: Not on file   Highest education level: Not on file  Occupational History    Occupation: retired    Fish farm manager: [employer]  Tobacco Use   Smoking status: Former    Packs/day: 0.50    Years: 30.00    Pack years: 15.00    Types: Cigarettes    Quit date: 04/15/2018    Years since quitting: 3.0   Smokeless tobacco: Never  Vaping Use   Vaping Use: Never used  Substance and Sexual Activity   Alcohol use: No    Alcohol/week: 0.0 standard drinks   Drug use: Never    Comment: last use 04/2018   Sexual activity: Yes  Other Topics Concern   Not on file  Social History Narrative   Not on file   Social Determinants of Health   Financial Resource Strain: Not on file  Food Insecurity: Not on file  Transportation Needs: Not on file  Physical Activity: Not on file  Stress: Not on file  Social Connections: Not on file   Vitals:   05/05/21 1037  BP: 124/80  Pulse: 60  Resp: 16  Temp: 98 F (36.7 C)  SpO2: 98%   Body mass index is 29.63 kg/m.  Physical Exam Vitals and nursing note reviewed.  Constitutional:      General: He is not in acute distress.    Appearance: He is well-developed and well-groomed.  HENT:     Head: Normocephalic and atraumatic.  Eyes:     Conjunctiva/sclera: Conjunctivae normal.  Cardiovascular:     Rate and Rhythm: Normal rate and regular rhythm.     Heart sounds: No murmur heard. Pulmonary:     Effort: Pulmonary effort is normal. No respiratory distress.     Breath sounds: Normal breath sounds.  Abdominal:     Palpations: Abdomen is soft.     Tenderness: There is no abdominal tenderness.  Skin:    General: Skin is warm.     Findings: No erythema.  Neurological:     Mental Status: He is alert and oriented to person, place, and time.     Comments: Mildly unstable gait assisted by a cane.  Psychiatric:        Mood and Affect: Affect is flat.        Thought Content: Thought content does not include suicidal ideation.   ASSESSMENT AND PLAN:  [name] was seen today for follow-up.  Diagnoses and all orders for this  visit:  Insomnia, unspecified type Recommend decreasing dose of Trazodone from 50 mg to 25 mg at bedtime. We discussed risk of interaction with Duloxetine. Good sleep hygiene.  -     traZODone (DESYREL) 50 MG tablet; Take 0.5 tablets (25 mg total) by mouth at bedtime as needed for  sleep.  Depressive reaction Minimal improvement. He agrees with trying higher dose, 60 mg daily. If problem is not greatly improved in 6-8 weeks, we can consider changing to a different agent or adding new medication. Instructed about warning signs. Has CBT appt in 05/2021.  -     DULoxetine (CYMBALTA) 60 MG capsule; Take 1 capsule (60 mg total) by mouth daily.  Chronic pain syndrome He did not notice major improvement. Duloxetine increased from 30 mg to 60 mg daily. Continue PT. Following with pain management.  -     DULoxetine (CYMBALTA) 60 MG capsule; Take 1 capsule (60 mg total) by mouth daily.  Return in about 3 months (around 08/05/2021) for 2-3 months.   Nicci Vaughan G. Martinique, MD  [facility] [place] office.  Insomnia, unspecified type - Plan: traZODone (DESYREL) 50 MG tablet  Depressive reaction  Chronic pain syndrome  If you need refills please call your pharmacy. Do not use My Chart to request refills or for acute issues that need immediate attention.   Today we are increase Duloxetine from 30 mg to 60 mg. Trazodone decrease to 1/2 tab,eventually we will wean it off.  Keep appt with psychotherapist.  Please be sure medication list is accurate. If a new problem present, please set up appointment sooner than planned today.

## 2021-05-06 ENCOUNTER — Ambulatory Visit (HOSPITAL_BASED_OUTPATIENT_CLINIC_OR_DEPARTMENT_OTHER): Payer: Medicare Other | Admitting: Physical Therapy

## 2021-05-06 ENCOUNTER — Encounter (HOSPITAL_BASED_OUTPATIENT_CLINIC_OR_DEPARTMENT_OTHER): Payer: Self-pay | Admitting: Physical Therapy

## 2021-05-06 DIAGNOSIS — R2681 Unsteadiness on feet: Secondary | ICD-10-CM

## 2021-05-06 DIAGNOSIS — M6281 Muscle weakness (generalized): Secondary | ICD-10-CM | POA: Diagnosis not present

## 2021-05-06 DIAGNOSIS — R2689 Other abnormalities of gait and mobility: Secondary | ICD-10-CM | POA: Diagnosis not present

## 2021-05-06 NOTE — Therapy (Signed)
[facility] [address] [place], [place], [address] Phone: [phone]   Fax:  [phone]  Physical Therapy Treatment  Patient Details  Name: Richard Vaughn MRN: [id number] Date of Birth: 19-Mar-1951 Referring Provider (PT): Dr Betty Martinique   Encounter Date: 05/06/2021   PT End of Session - 05/06/21 1618     Visit Number 4    Number of Visits 16    Date for PT Re-Evaluation 06/19/21    Authorization Type Medicare A and B    PT Start Time 0845    PT Stop Time 0929    PT Time Calculation (min) 44 min    Equipment Utilized During Treatment Other (comment)   water board   Activity Tolerance Patient tolerated treatment well    Behavior During Therapy [facility] for tasks assessed/performed             Past Medical History:  Diagnosis Date   Barrett's esophagus    Chronic back pain    Depression    Gastritis    Mild   Gastroparesis    GERD (gastroesophageal reflux disease)    Headache    History of kidney stones    Hyperglycemia    Hypertension    Nephrolithiasis    Pneumonia    Covid pneumonia   Renal cyst    Stricture and stenosis of esophagus    Stroke [facility]) 07-2015   Vision abnormalities     Past Surgical History:  Procedure Laterality Date   CERVICAL [place] SURGERY  2012   COLONOSCOPY     LUMBAR [place] SURGERY  2005, 2010   replaced L4 and L5   SHOULDER ARTHROSCOPY WITH ROTATOR CUFF REPAIR Left 08/14/2020   Procedure: SHOULDER ARTHROSCOPY WITH ROTATOR CUFF REPAIR, SUBACROMIAL DECOMPRESSION;  Surgeon: Tania Ade, MD;  Location: [facility];  Service: Orthopedics;  Laterality: Left;   UPPER GASTROINTESTINAL ENDOSCOPY      There were no vitals filed for this visit.   Subjective Assessment - 05/06/21 1614     Subjective Patient reports no back pain at the time of the visit. He states he is doing better but has still has trouble with balance.    Patient is accompained by: Family member    Pertinent History  CVA, Lower Back Pain    How long can you stand comfortably? limited time    How long can you walk comfortably? limited community distances    Diagnostic tests nothing pertinant sisnce before his shoulder surgery    Patient Stated Goals to become more mobile and get back to normal as much as able    Currently in Pain? No/denies    Pain Score 0-No pain    Multiple Pain Sites No              Pt seen for aquatic therapy today.  Treatment took place in water 3.25-4 ft in depth at the [facility] pool. Temp of water was 91.  Pt entered/exited the pool via stairs (step through pattern) independently with bilat rail.  Introduction to water. Had patient stand at different levels so she could feel the bouncy   Warm up: heel/toe walking x4 laps across pool chest deep side stepping x4 laps from shallow to deep;   Exercises; Slow march x20; hip 3-way x20; hip extension x20; hip abduction x20; hip flexion x20, calf raises with balance x20 each leg; double leg squat x20; balance exercises: Romberg 2x20sec eyes closed with perturbations, tandem stance eyes closed 2x20sec, single  leg stance 2x15sec each leg eyes opened; shoulder assisted flexion and abduction (water board) 3x10.  Pt requires buoyancy for support and to offload joints with strengthening exercises. Viscosity of the water is needed for resistance of strengthening; water current perturbations provides challenge to standing balance unsupported, requiring increased core activation.        PT Education - 05/06/21 1617     Education Details Reviewed balance exercises and hip exercises.    Person(s) Educated Patient    Methods Demonstration;Explanation;Tactile cues;Verbal cues    Comprehension Verbalized understanding;Returned demonstration;Verbal cues required;Tactile cues required              PT Short Term Goals - 04/24/21 1722       PT SHORT TERM GOAL #1   Title Patient will increase BERG test to 36/56    Time 3     Period Weeks    Status New    Target Date 06/05/21      PT SHORT TERM GOAL #2   Title Patient will increase left LE strength to 5/5    Time 3    Period Weeks    Status New    Target Date 05/15/21      PT SHORT TERM GOAL #3   Title Patient will demonstrate 20 degrees of left shoulder flexion ( may not be potential but we will perfrom light strengthening in the pool)    Time 3    Period Weeks    Status New    Target Date 05/15/21               PT Long Term Goals - 04/24/21 1727       PT LONG TERM GOAL #1   Title Patient will be indepdnent with a full pool program they can carryover at the [facility]    Time 8    Period Weeks    Status New    Target Date 06/19/21      PT LONG TERM GOAL #2   Title Pt will increase Berg from 44/56 or more for improved balance and decreased fall risk.    Time 6    Period Weeks    Status On-going      PT LONG TERM GOAL #3   Title Patient will mabualte 1000' in the community without loss of balance in order to improve safety    Time 6    Period Weeks    Status New    Target Date 06/05/21                   Plan - 05/06/21 1621     Clinical Impression Statement Patient tolerated treatment well. He is able to walk across pool at a comfortable speed without losing balance. He worked on hip strengthing exercises today. He also performed balance focused exercises (tandem, rhomberg, single leg balance). Patient is able to flex and abduct his shoulder in limited ROM in the pool assisted with a water board but complains of dull pain 1-2/10 but unable still outside of the pool. Patient should continue to progress thera-ex as tolerated.    Stability/Clinical Decision Making Evolving/Moderate complexity    Clinical Decision Making Moderate    Rehab Potential Good    PT Frequency 2x / week    PT Duration 8 weeks    PT Treatment/Interventions ADLs/Self Care Home Management;Aquatic Therapy;Electrical Stimulation;Cryotherapy;Moist  Heat;Ultrasound;Gait training;Functional mobility training;Therapeutic activities;Therapeutic exercise;Neuromuscular re-education;Patient/family education;Manual techniques;Passive range of motion;Spinal Manipulations    PT Next Visit Plan Advance  balance challenges, increase ROM as able on shoulder flexion and abduction.    PT Home Exercise Plan Access Code: [email or web address]  URL: [email or web address]  Date: 04/27/2021  Prepared by: Denton Meek    Exercises  Backward Walking - 1 x daily - 7 x weekly - 3 sets - 10 reps  Forward March - 1 x daily - 7 x weekly - 3 sets - 10 reps  Side Stepping - 1 x daily - 7 x weekly - 3 sets - 10 reps  Jumping Jacks - 1 x daily - 7 x weekly - 3 sets - 10 reps  Standing Hip Circles at [employer] - 1 x daily - 7 x weekly - 3 sets - 10 reps    Consulted and Agree with Plan of Care Patient             Patient will benefit from skilled therapeutic intervention in order to improve the following deficits and impairments:  Decreased strength, Difficulty walking, Decreased balance, Decreased coordination  Visit Diagnosis: No diagnosis found.     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) 10/25/2018   Chronic pain syndrome    Chronic bilateral low back pain    Benign essential HTN    Tobacco abuse    Marijuana abuse    Hyperlipidemia    History of CVA with residual deficit    Dysphagia, post-stroke    ICH (intracerebral hemorrhage) (HCC) - R thalmic/PLIC d/t HTN 02/54/2706   Insomnia 07/13/2017   PAD (peripheral artery disease) ([place]) 07/13/2017   Hyperlipidemia with target LDL less than 70 07/13/2017   Stroke-like symptom 09/22/2016   Paresthesia  09/22/2016   CVA (cerebral vascular accident) ([place]) 09/22/2016   Cerebrovascular accident (CVA) due to thrombosis of left carotid artery ([place]) 09/23/2015   Primary snoring 09/23/2015   Hypersomnia with sleep apnea 09/23/2015   Obstructive sleep apnea 09/23/2015   Lacunar infarct, acute ([place]) 08/25/2015   Sleep apnea 08/25/2015   Dysarthria    CVD (cardiovascular disease) 08/02/2015   Acute hyperglycemia 23/76/2831   Systolic hypertension with cerebrovascular disease 12/27/2014   Epistaxis 07/28/2012   HYPERGLYCEMIA 10/02/2010   NEPHROLITHIASIS 05/23/2009   BARRETTS ESOPHAGUS 02/20/2009   Gastroparesis 02/20/2009   RADICULOPATHY 10/21/2008   GERD 10/08/2008   ESOPHAGITIS 08/15/2008   ESOPHAGEAL STRICTURE 08/15/2008   GASTRITIS, ACUTE 08/15/2008   RENAL CYST 08/14/2008   TOBACCO ABUSE 08/08/2008   HEMATURIA, MICROSCOPIC, HX OF 08/08/2008   PULMONARY NODULE 01/10/2008   Carolyne Littles PT DPT  05/06/2021   Billey Co SPT 05/06/2021, 4:33 PM  [facility] [address] [place], [place], [address] Phone: [phone]   Fax:  [phone]  Name: Richard Vaughn MRN: [id number] Date of Birth: 1951-01-16

## 2021-05-08 ENCOUNTER — Ambulatory Visit (HOSPITAL_BASED_OUTPATIENT_CLINIC_OR_DEPARTMENT_OTHER): Payer: Medicare Other | Admitting: Physical Therapy

## 2021-05-08 ENCOUNTER — Encounter (HOSPITAL_BASED_OUTPATIENT_CLINIC_OR_DEPARTMENT_OTHER): Payer: Self-pay | Admitting: Physical Therapy

## 2021-05-08 ENCOUNTER — Other Ambulatory Visit: Payer: Self-pay

## 2021-05-08 DIAGNOSIS — R2681 Unsteadiness on feet: Secondary | ICD-10-CM

## 2021-05-08 DIAGNOSIS — M6281 Muscle weakness (generalized): Secondary | ICD-10-CM

## 2021-05-08 DIAGNOSIS — R2689 Other abnormalities of gait and mobility: Secondary | ICD-10-CM | POA: Diagnosis not present

## 2021-05-08 NOTE — Therapy (Signed)
[facility] [address] [place], [place], [address] Phone: [phone]   Fax:  [phone]  Physical Therapy Treatment  Patient Details  Name: Richard Vaughn MRN: [id number] Date of Birth: [birth year] Referring Provider (PT): Dr Betty Martinique   Encounter Date: 05/08/2021   PT End of Session - 05/08/21 1425     Visit Number 5    Number of Visits 16    Date for PT Re-Evaluation 06/19/21    Authorization Type Medicare A and B    PT Start Time 1020   Patient 5 min late   PT Stop Time 1100    PT Time Calculation (min) 40 min    Activity Tolerance Patient tolerated treatment well    Behavior During Therapy [facility] for tasks assessed/performed             Past Medical History:  Diagnosis Date   Barrett's esophagus    Chronic back pain    Depression    Gastritis    Mild   Gastroparesis    GERD (gastroesophageal reflux disease)    Headache    History of kidney stones    Hyperglycemia    Hypertension    Nephrolithiasis    Pneumonia    Covid pneumonia   Renal cyst    Stricture and stenosis of esophagus    Stroke [facility]) 07-2015   Vision abnormalities     Past Surgical History:  Procedure Laterality Date   CERVICAL [place] SURGERY  2012   COLONOSCOPY     LUMBAR [place] SURGERY  2005, 2010   replaced L4 and L5   SHOULDER ARTHROSCOPY WITH ROTATOR CUFF REPAIR Left 08/14/2020   Procedure: SHOULDER ARTHROSCOPY WITH ROTATOR CUFF REPAIR, SUBACROMIAL DECOMPRESSION;  Surgeon: Tania Ade, MD;  Location: [facility];  Service: Orthopedics;  Laterality: Left;   UPPER GASTROINTESTINAL ENDOSCOPY      There were no vitals filed for this visit.   Subjective Assessment - 05/08/21 1117     Subjective Patient reports minor pain after the last visit in his [place]. overall he feels like he is getting stronger.    Patient is accompained by: Family member    Pertinent History CVA, Lower Back Pain    How long can you stand comfortably?  limited time    How long can you walk comfortably? limited community distances    Diagnostic tests nothing pertinant sisnce before his shoulder surgery    Patient Stated Goals to become more mobile and get back to normal as much as able    Currently in Pain? No/denies                  Pt seen for aquatic therapy today.  Treatment took place in water 3.25-4 ft in depth at the [employer] [facility] pool. Temp of water was 91.  Pt entered/exited the pool via stairs (step through pattern) independently with bilat rail.   Introduction to water. Had patient stand at different levels so she could feel the bouncy   Warm up: heel/toe walking x4 laps across pool chest deep side stepping x4 laps from shallow to deep;   Exercises; Slow march x20; hip 3-way x20; hip extension x20; hip abduction x20; hip flexion x20, calf raises with balance x20 each leg; double leg squat x20; balance exercises: Romberg 2x20sec eyes closed with perturbations, tandem stance eyes closed 2x20sec, single leg stance 2x15sec each leg eyes opened; shoulder assisted flexion and abduction (water board) 3x10.   Pt requires buoyancy for  support and to offload joints with strengthening exercises. Viscosity of the water is needed for resistance of strengthening; water current perturbations provides challenge to standing balance unsupported, requiring increased core activation.                         PT Short Term Goals - 04/24/21 1722       PT SHORT TERM GOAL #1   Title Patient will increase BERG test to 36/56    Time 3    Period Weeks    Status New    Target Date 06/05/21      PT SHORT TERM GOAL #2   Title Patient will increase left LE strength to 5/5    Time 3    Period Weeks    Status New    Target Date 05/15/21      PT SHORT TERM GOAL #3   Title Patient will demonstrate 20 degrees of left shoulder flexion ( may not be potential but we will perfrom light strengthening in the pool)    Time  3    Period Weeks    Status New    Target Date 05/15/21               PT Long Term Goals - 04/24/21 1727       PT LONG TERM GOAL #1   Title Patient will be indepdnent with a full pool program they can carryover at the [facility]    Time 8    Period Weeks    Status New    Target Date 06/19/21      PT LONG TERM GOAL #2   Title Pt will increase Berg from 44/56 or more for improved balance and decreased fall risk.    Time 6    Period Weeks    Status On-going      PT LONG TERM GOAL #3   Title Patient will mabualte 1000' in the community without loss of balance in order to improve safety    Time 6    Period Weeks    Status New    Target Date 06/05/21                   Plan - 05/08/21 1425     Clinical Impression Statement Patient reported he flet more stable in the water today. He performed high level balance exercises with pertebations.  He tolerated strengthening exercises well. He continues to have some movement of his left shoulder in the water.    Stability/Clinical Decision Making Evolving/Moderate complexity    Clinical Decision Making Moderate    Rehab Potential Good    PT Frequency 2x / week    PT Treatment/Interventions ADLs/Self Care Home Management;Aquatic Therapy;Electrical Stimulation;Cryotherapy;Moist Heat;Ultrasound;Gait training;Functional mobility training;Therapeutic activities;Therapeutic exercise;Neuromuscular re-education;Patient/family education;Manual techniques;Passive range of motion;Spinal Manipulations    PT Next Visit Plan Advance balance challenges, increase ROM as able on shoulder flexion and abduction.    PT Home Exercise Plan Access Code: [email or web address]  URL: [email or web address]  Date: 04/27/2021  Prepared by: Denton Meek    Exercises  Backward Walking - 1 x daily - 7 x weekly - 3 sets - 10 reps  Forward March - 1 x daily - 7 x weekly - 3 sets - 10 reps  Side Stepping - 1 x daily - 7 x weekly - 3 sets - 10 reps  Jumping Jacks -  1 x daily - 7 x weekly - 3 sets - 10 reps  Standing Hip Circles at [employer] - 1 x daily - 7 x weekly - 3 sets - 10 reps    Consulted and Agree with Plan of Care Patient             Patient will benefit from skilled therapeutic intervention in order to improve the following deficits and impairments:  Decreased strength, Difficulty walking, Decreased balance, Decreased coordination  Visit Diagnosis: Unsteadiness on feet  Muscle weakness (generalized)     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) 10/25/2018   Chronic pain syndrome    Chronic bilateral low back pain    Benign essential HTN    Tobacco abuse    Marijuana abuse    Hyperlipidemia    History of CVA with residual deficit    Dysphagia, post-stroke    ICH (intracerebral hemorrhage) ([place]) - R thalmic/PLIC d/t HTN 38/25/0539   Insomnia 07/13/2017   PAD (peripheral artery disease) ([place]) 07/13/2017   Hyperlipidemia with target LDL less than 70 07/13/2017   Stroke-like symptom 09/22/2016   Paresthesia 09/22/2016   CVA (cerebral vascular accident) ([place]) 09/22/2016   Cerebrovascular accident (CVA) due to thrombosis of left carotid artery ([place]) 09/23/2015   Primary snoring 09/23/2015   Hypersomnia with sleep apnea 09/23/2015   Obstructive sleep apnea 09/23/2015   Lacunar infarct, acute ([place]) 08/25/2015   Sleep apnea 08/25/2015   Dysarthria    CVD (cardiovascular disease) 08/02/2015   Acute hyperglycemia 76/73/4193   Systolic hypertension with cerebrovascular disease 12/27/2014   Epistaxis 07/28/2012   HYPERGLYCEMIA 10/02/2010   NEPHROLITHIASIS 05/23/2009   BARRETTS ESOPHAGUS 02/20/2009   Gastroparesis 02/20/2009    RADICULOPATHY 10/21/2008   GERD 10/08/2008   ESOPHAGITIS 08/15/2008   ESOPHAGEAL STRICTURE 08/15/2008   GASTRITIS, ACUTE 08/15/2008   RENAL CYST 08/14/2008   TOBACCO ABUSE 08/08/2008   HEMATURIA, MICROSCOPIC, HX OF 08/08/2008   PULMONARY NODULE 01/10/2008    Carney Living PT DPT  05/08/2021, 2:27 PM  [facility] [address] [place], [place], [address] Phone: [phone]   Fax:  [phone]  Name: JOSSUE RUBENSTEIN MRN: [id number] Date of Birth: 04-Jan-1951

## 2021-05-13 ENCOUNTER — Ambulatory Visit (HOSPITAL_BASED_OUTPATIENT_CLINIC_OR_DEPARTMENT_OTHER): Payer: Medicare Other | Admitting: Physical Therapy

## 2021-05-13 ENCOUNTER — Other Ambulatory Visit: Payer: Self-pay

## 2021-05-13 DIAGNOSIS — M6281 Muscle weakness (generalized): Secondary | ICD-10-CM

## 2021-05-13 DIAGNOSIS — R2689 Other abnormalities of gait and mobility: Secondary | ICD-10-CM | POA: Diagnosis not present

## 2021-05-13 DIAGNOSIS — R2681 Unsteadiness on feet: Secondary | ICD-10-CM | POA: Diagnosis not present

## 2021-05-14 ENCOUNTER — Encounter (HOSPITAL_BASED_OUTPATIENT_CLINIC_OR_DEPARTMENT_OTHER): Payer: Self-pay | Admitting: Physical Therapy

## 2021-05-14 NOTE — Therapy (Cosign Needed)
[facility] [address] [place], [place], [address] Phone: [phone]   Fax:  [phone]  Physical Therapy Treatment  Patient Details  Name: Richard Vaughn MRN: [id number] Date of Birth: Mar 09, 1951 Referring Provider (PT): Dr Betty Martinique   Encounter Date: 05/13/2021   PT End of Session - 05/14/21 1415     Visit Number 6    Number of Visits 16    Date for PT Re-Evaluation 06/19/21    Authorization Type Medicare A and B    PT Start Time 0845    PT Stop Time 0925    PT Time Calculation (min) 40 min    Equipment Utilized During Treatment Other (comment)   aquatic equipment: water board, water dumbells   Activity Tolerance Patient tolerated treatment well    Behavior During Therapy WFL for tasks assessed/performed             Past Medical History:  Diagnosis Date   Barrett's esophagus    Chronic back pain    Depression    Gastritis    Mild   Gastroparesis    GERD (gastroesophageal reflux disease)    Headache    History of kidney stones    Hyperglycemia    Hypertension    Nephrolithiasis    Pneumonia    Covid pneumonia   Renal cyst    Stricture and stenosis of esophagus    Stroke [facility]) 07-2015   Vision abnormalities     Past Surgical History:  Procedure Laterality Date   CERVICAL [place] SURGERY  2012   COLONOSCOPY     LUMBAR [place] SURGERY  2005, 2010   replaced L4 and L5   SHOULDER ARTHROSCOPY WITH ROTATOR CUFF REPAIR Left 08/14/2020   Procedure: SHOULDER ARTHROSCOPY WITH ROTATOR CUFF REPAIR, SUBACROMIAL DECOMPRESSION;  Surgeon: Tania Ade, MD;  Location: [facility];  Service: Orthopedics;  Laterality: Left;   UPPER GASTROINTESTINAL ENDOSCOPY      There were no vitals filed for this visit.   Subjective Assessment - 05/14/21 1410     Subjective Patient reports decreased discomfort in his left shoulder with little to no pain. Patient feels he is improving his balance and strength but states "he wants  to get challenged more in PT."    Patient is accompained by: Family member    Pertinent History CVA, Lower Back Pain    Limitations Walking    How long can you stand comfortably? limited time    How long can you walk comfortably? limited community distances    Diagnostic tests nothing pertinant sisnce before his shoulder surgery    Patient Stated Goals to become more mobile and get back to normal as much as able    Currently in Pain? No/denies    Pain Score 0-No pain    Pain Location Back    Pain Orientation Lower    Pain Type Chronic pain    Pain Onset 1 to 4 weeks ago    Pain Frequency Intermittent    Aggravating Factors  standing and walking    Pain Relieving Factors sitting down    Effect of Pain on Daily Activities decreased ability to move    Multiple Pain Sites No             Pt seen for aquatic therapy today.  Treatment took place in water 3.25-4 ft in depth at the [facility] pool. Temp of water was 91.  Pt entered/exited the pool via stairs (step through pattern) independently with  bilat rail.   Introduction to water. Had patient stand at different levels so she could feel the bouncy   Warm up: heel/toe walking x4 laps across pool chest deep side stepping x4 laps from shallow to deep.   Exercises; Slow march x20; hip 3-way x20; hip extension x20; hip abduction x20; hip flexion x20, calf raises with balance x20 each leg; double leg squat x20. balance exercises: Romberg 3x20sec eyes closed with perturbations, tandem stance eyes closed 2x20sec each with left and right leg in front, single leg stance 2x15sec each leg eyes opened; shoulder assisted flexion and abduction (water board) 3x10. Seated water board stretched 2x10. Dumbbell water extension 2x15. Forward Step Ups with 2 second hold for balance 2x5 for each foot.      Pt requires buoyancy for support and to offload joints with strengthening exercises. Viscosity of the water is needed for resistance of  strengthening; water current perturbations provides challenge to standing balance unsupported, requiring increased core activation.       PT Education - 05/14/21 1414     Education Details reviewed balance, hip strengthen, and shoulder mobility exercises in aquatics    Person(s) Educated Patient    Methods Explanation;Demonstration;Tactile cues;Verbal cues    Comprehension Verbalized understanding;Returned demonstration;Verbal cues required;Tactile cues required              PT Short Term Goals - 04/24/21 1722       PT SHORT TERM GOAL #1   Title Patient will increase BERG test to 36/56    Time 3    Period Weeks    Status New    Target Date 06/05/21      PT SHORT TERM GOAL #2   Title Patient will increase left LE strength to 5/5    Time 3    Period Weeks    Status New    Target Date 05/15/21      PT SHORT TERM GOAL #3   Title Patient will demonstrate 20 degrees of left shoulder flexion ( may not be potential but we will perfrom light strengthening in the pool)    Time 3    Period Weeks    Status New    Target Date 05/15/21               PT Long Term Goals - 04/24/21 1727       PT LONG TERM GOAL #1   Title Patient will be indepdnent with a full pool program they can carryover at the [facility]    Time 8    Period Weeks    Status New    Target Date 06/19/21      PT LONG TERM GOAL #2   Title Pt will increase Berg from 44/56 or more for improved balance and decreased fall risk.    Time 6    Period Weeks    Status On-going      PT LONG TERM GOAL #3   Title Patient will mabualte 1000' in the community without loss of balance in order to improve safety    Time 6    Period Weeks    Status New    Target Date 06/05/21                   Plan - 05/14/21 1417     Clinical Impression Statement Patient tolerated treatment well. He continues to improve in his balance and hip strength and is able to perform balance exercises with eyes open with little  difficulty (tandem, rhomberg). Patient has difficulty with single leg exercises and must be contact guard. Patient is able to walk at faster speeds than last visit with increased balance. Patients shoulder flexion and abduction range of motion is improving with water board for assist. Patient should continue to progress HEP with more difficult exercises.    Stability/Clinical Decision Making Evolving/Moderate complexity    Clinical Decision Making Moderate    Rehab Potential Good    PT Frequency 2x / week    PT Duration 8 weeks    PT Treatment/Interventions ADLs/Self Care Home Management;Aquatic Therapy;Electrical Stimulation;Cryotherapy;Moist Heat;Ultrasound;Gait training;Functional mobility training;Therapeutic activities;Therapeutic exercise;Neuromuscular re-education;Patient/family education;Manual techniques;Passive range of motion;Spinal Manipulations    PT Next Visit Plan Advance balance challenges, increase ROM as able on shoulder flexion and abduction.    PT Home Exercise Plan Access Code: [email or web address]  URL: [email or web address]  Date: 04/27/2021  Prepared by: Denton Meek    Exercises  Backward Walking - 1 x daily - 7 x weekly - 3 sets - 10 reps  Forward March - 1 x daily - 7 x weekly - 3 sets - 10 reps  Side Stepping - 1 x daily - 7 x weekly - 3 sets - 10 reps  Jumping Jacks - 1 x daily - 7 x weekly - 3 sets - 10 reps  Standing Hip Circles at [employer] - 1 x daily - 7 x weekly - 3 sets - 10 reps    Consulted and Agree with Plan of Care Patient             Patient will benefit from skilled therapeutic intervention in order to improve the following deficits and impairments:  Decreased strength, Difficulty walking, Decreased balance, Decreased coordination  Visit Diagnosis: Unsteadiness on feet  Muscle weakness (generalized)     Problem List Patient Active Problem List   Diagnosis Date Noted   Depressive reaction    Sleep disturbance    Urinary retention     Bradycardia    Essential hypertension    Temperature elevated    Left thyroid nodule    Treatment-emergent central sleep apnea 10/25/2018   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) 10/25/2018   Chronic pain syndrome    Chronic bilateral low back pain    Benign essential HTN    Tobacco abuse    Marijuana abuse    Hyperlipidemia    History of CVA with residual deficit    Dysphagia, post-stroke    ICH (intracerebral hemorrhage) ([place]) - R thalmic/PLIC d/t HTN 96/02/5408   Insomnia 07/13/2017   PAD (peripheral artery disease) ([place]) 07/13/2017   Hyperlipidemia with target LDL less than 70 07/13/2017   Stroke-like symptom 09/22/2016   Paresthesia 09/22/2016   CVA (cerebral vascular accident) ([place]) 09/22/2016   Cerebrovascular accident (CVA) due to thrombosis of left carotid artery ([place]) 09/23/2015   Primary snoring 09/23/2015   Hypersomnia with sleep apnea 09/23/2015   Obstructive sleep apnea 09/23/2015   Lacunar infarct, acute ([place]) 08/25/2015   Sleep apnea 08/25/2015   Dysarthria    CVD (cardiovascular disease) 08/02/2015   Acute hyperglycemia 81/19/1478   Systolic hypertension with cerebrovascular disease 12/27/2014   Epistaxis 07/28/2012   HYPERGLYCEMIA 10/02/2010   NEPHROLITHIASIS 05/23/2009   BARRETTS ESOPHAGUS 02/20/2009   Gastroparesis 02/20/2009   RADICULOPATHY 10/21/2008   GERD 10/08/2008   ESOPHAGITIS 08/15/2008   ESOPHAGEAL STRICTURE 08/15/2008   GASTRITIS, ACUTE 08/15/2008   RENAL CYST 08/14/2008   TOBACCO ABUSE 08/08/2008   HEMATURIA, MICROSCOPIC, HX OF 08/08/2008  PULMONARY NODULE 01/10/2008    Carolyne Littles PT DPT  05/14/2021  Billey Co SPT 05/14/2021, 2:32 PM  During this treatment session, the therapist was present, participating in and directing the treatment.   [facility] [address] [place], [place], [address] Phone: [phone]   Fax:  [phone]  Name:  Richard Vaughn MRN: [id number] Date of Birth: 07-24-1951

## 2021-05-15 ENCOUNTER — Other Ambulatory Visit: Payer: Self-pay

## 2021-05-15 ENCOUNTER — Ambulatory Visit (HOSPITAL_BASED_OUTPATIENT_CLINIC_OR_DEPARTMENT_OTHER): Payer: Medicare Other | Attending: Family Medicine | Admitting: Physical Therapy

## 2021-05-15 ENCOUNTER — Encounter (HOSPITAL_BASED_OUTPATIENT_CLINIC_OR_DEPARTMENT_OTHER): Payer: Self-pay | Admitting: Physical Therapy

## 2021-05-15 DIAGNOSIS — R2689 Other abnormalities of gait and mobility: Secondary | ICD-10-CM | POA: Insufficient documentation

## 2021-05-15 DIAGNOSIS — I69354 Hemiplegia and hemiparesis following cerebral infarction affecting left non-dominant side: Secondary | ICD-10-CM | POA: Insufficient documentation

## 2021-05-15 DIAGNOSIS — R278 Other lack of coordination: Secondary | ICD-10-CM | POA: Insufficient documentation

## 2021-05-15 DIAGNOSIS — R2681 Unsteadiness on feet: Secondary | ICD-10-CM | POA: Insufficient documentation

## 2021-05-15 DIAGNOSIS — M6281 Muscle weakness (generalized): Secondary | ICD-10-CM | POA: Insufficient documentation

## 2021-05-15 NOTE — Therapy (Cosign Needed)
[facility] [address] [place], [place], [address] Phone: [phone]   Fax:  [phone]  Physical Therapy Treatment  Patient Details  Name: Richard Vaughn MRN: [id number] Date of Birth: 1951/01/01 Referring Provider (PT): Dr Betty Martinique   Encounter Date: 05/15/2021   PT End of Session - 05/15/21 0957     Visit Number 7    Number of Visits 16    Date for PT Re-Evaluation 06/19/21    Authorization Type Medicare A and B    PT Start Time 0930    PT Stop Time 1012    PT Time Calculation (min) 42 min    Equipment Utilized During Treatment Other (comment)   aquatic equipment; steps, water board   Activity Tolerance Patient tolerated treatment well    Behavior During Therapy WFL for tasks assessed/performed             Past Medical History:  Diagnosis Date   Barrett's esophagus    Chronic back pain    Depression    Gastritis    Mild   Gastroparesis    GERD (gastroesophageal reflux disease)    Headache    History of kidney stones    Hyperglycemia    Hypertension    Nephrolithiasis    Pneumonia    Covid pneumonia   Renal cyst    Stricture and stenosis of esophagus    Stroke [facility]) 07-2015   Vision abnormalities     Past Surgical History:  Procedure Laterality Date   CERVICAL [place] SURGERY  2012   COLONOSCOPY     LUMBAR [place] SURGERY  2005, 2010   replaced L4 and L5   SHOULDER ARTHROSCOPY WITH ROTATOR CUFF REPAIR Left 08/14/2020   Procedure: SHOULDER ARTHROSCOPY WITH ROTATOR CUFF REPAIR, SUBACROMIAL DECOMPRESSION;  Surgeon: Tania Ade, MD;  Location: [facility];  Service: Orthopedics;  Laterality: Left;   UPPER GASTROINTESTINAL ENDOSCOPY      There were no vitals filed for this visit.   Subjective Assessment - 05/15/21 1319     Subjective Patient has no complaints. He requests to be pushed with his exercises.    Patient is accompained by: Family member    Pertinent History CVA, Lower Back Pain     Limitations Walking    How long can you stand comfortably? limited time    How long can you walk comfortably? limited community distances    Diagnostic tests nothing pertinant sisnce before his shoulder surgery    Patient Stated Goals to become more mobile and get back to normal as much as able    Currently in Pain? No/denies             Pt seen for aquatic therapy today.  Treatment took place in water 3.25-4 ft in depth at the [facility] pool. Temp of water was 91.  Pt entered/exited the pool via stairs (step through pattern) independently with bilat rail.     Warm up: heel/toe walking x4 laps across pool chest deep side stepping x4 laps from shallow to deep.   Exercises; Slow march x20; hip 3-way x20; hip extension x20; hip abduction x20; hip flexion x20, calf raises with balance x20 each leg; double leg squat x20. balance exercises: Romberg 3x20sec eyes closed with perturbations, tandem stance eyes closed 2x20sec each with left and right leg in front, single leg stance 2x15sec each leg eyes opened; shoulder assisted flexion and abduction (water board) 3x10. Seated water board stretched 2x10. Dumbbell water extension 2x15. Forward  Step Ups with 2 second hold for balance 2x5 for each foot. Sit to stand 1x5    Pt requires buoyancy for support and to offload joints with strengthening exercises. Viscosity of the water is needed for resistance of strengthening; water current perturbations provides challenge to standing balance unsupported, requiring increased core activation       PT Education - 05/15/21 1414     Education Details Reviewed balance exercises, core and hip strengthen, and shoulder mobility exercises in aquatic therapy.    Person(s) Educated Patient    Methods Explanation;Tactile cues;Demonstration;Verbal cues    Comprehension Verbalized understanding;Returned demonstration;Verbal cues required;Tactile cues required              PT Short Term Goals -  04/24/21 1722       PT SHORT TERM GOAL #1   Title Patient will increase BERG test to 36/56    Time 3    Period Weeks    Status New    Target Date 06/05/21      PT SHORT TERM GOAL #2   Title Patient will increase left LE strength to 5/5    Time 3    Period Weeks    Status New    Target Date 05/15/21      PT SHORT TERM GOAL #3   Title Patient will demonstrate 20 degrees of left shoulder flexion ( may not be potential but we will perfrom light strengthening in the pool)    Time 3    Period Weeks    Status New    Target Date 05/15/21               PT Long Term Goals - 04/24/21 1727       PT LONG TERM GOAL #1   Title Patient will be indepdnent with a full pool program they can carryover at the [facility]    Time 8    Period Weeks    Status New    Target Date 06/19/21      PT LONG TERM GOAL #2   Title Pt will increase Berg from 44/56 or more for improved balance and decreased fall risk.    Time 6    Period Weeks    Status On-going      PT LONG TERM GOAL #3   Title Patient will mabualte 1000' in the community without loss of balance in order to improve safety    Time 6    Period Weeks    Status New    Target Date 06/05/21                   Plan - 05/15/21 1415     Clinical Impression Statement Patient tolerated treatment well. He continues to improve balance, strength, gait, and shoulder mobility. He is able to perform tandem stance balance with decreased difficulty. Patient still needs work with single leg stance. Patient performed step up with balance component at end of session with some difficulty but enjoyed the challenge. Overall, patient should continue to progress aquatic exercise plan and conisder land exercises such as weighted machines.    Stability/Clinical Decision Making Evolving/Moderate complexity    Clinical Decision Making Moderate    Rehab Potential Good    PT Frequency 2x / week    PT Duration 8 weeks    PT Treatment/Interventions  ADLs/Self Care Home Management;Aquatic Therapy;Electrical Stimulation;Cryotherapy;Moist Heat;Ultrasound;Gait training;Functional mobility training;Therapeutic activities;Therapeutic exercise;Neuromuscular re-education;Patient/family education;Manual techniques;Passive range of motion;Spinal Manipulations    PT Next Visit Plan  Advance balance challenges, increase ROM as able on shoulder flexion and abduction.    PT Home Exercise Plan Access Code: [email or web address]  URL: [email or web address]  Date: 04/27/2021  Prepared by: Denton Meek    Exercises  Backward Walking - 1 x daily - 7 x weekly - 3 sets - 10 reps  Forward March - 1 x daily - 7 x weekly - 3 sets - 10 reps  Side Stepping - 1 x daily - 7 x weekly - 3 sets - 10 reps  Jumping Jacks - 1 x daily - 7 x weekly - 3 sets - 10 reps  Standing Hip Circles at [employer] - 1 x daily - 7 x weekly - 3 sets - 10 reps    Consulted and Agree with Plan of Care Patient             Patient will benefit from skilled therapeutic intervention in order to improve the following deficits and impairments:  Decreased strength, Difficulty walking, Decreased balance, Decreased coordination  Visit Diagnosis: Muscle weakness (generalized)  Unsteadiness on feet     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) 10/25/2018   Chronic pain syndrome    Chronic bilateral low back pain    Benign essential HTN    Tobacco abuse    Marijuana abuse    Hyperlipidemia    History of CVA with residual deficit    Dysphagia, post-stroke    ICH (intracerebral hemorrhage) ([place]) - R thalmic/PLIC d/t HTN 57/11/7791   Insomnia 07/13/2017   PAD (peripheral artery disease) ([place])  07/13/2017   Hyperlipidemia with target LDL less than 70 07/13/2017   Stroke-like symptom 09/22/2016   Paresthesia 09/22/2016   CVA (cerebral vascular accident) ([place]) 09/22/2016   Cerebrovascular accident (CVA) due to thrombosis of left carotid artery ([place]) 09/23/2015   Primary snoring 09/23/2015   Hypersomnia with sleep apnea 09/23/2015   Obstructive sleep apnea 09/23/2015   Lacunar infarct, acute ([place]) 08/25/2015   Sleep apnea 08/25/2015   Dysarthria    CVD (cardiovascular disease) 08/02/2015   Acute hyperglycemia 90/30/0923   Systolic hypertension with cerebrovascular disease 12/27/2014   Epistaxis 07/28/2012   HYPERGLYCEMIA 10/02/2010   NEPHROLITHIASIS 05/23/2009   BARRETTS ESOPHAGUS 02/20/2009   Gastroparesis 02/20/2009   RADICULOPATHY 10/21/2008   GERD 10/08/2008   ESOPHAGITIS 08/15/2008   ESOPHAGEAL STRICTURE 08/15/2008   GASTRITIS, ACUTE 08/15/2008   RENAL CYST 08/14/2008   TOBACCO ABUSE 08/08/2008   HEMATURIA, MICROSCOPIC, HX OF 08/08/2008   PULMONARY NODULE 01/10/2008   Carolyne Littles DPT  05/15/2021  Billey Co SPT 05/15/2021, 2:33 PM  During this treatment session, the therapist was present, participating in and directing the treatment.   [facility] [address] [place], [place], [address] Phone: [phone]   Fax:  [phone]  Name: Richard Vaughn MRN: [id number] Date of Birth: February 27, 1951

## 2021-05-15 NOTE — Therapy (Signed)
[facility] [address] [place], [place], [address] Phone: [phone]   Fax:  [phone]  Physical Therapy Treatment  Patient Details  Name: Richard Vaughn MRN: [id number] Date of Birth: 14-Sep-1951 Referring Provider (PT): Dr Betty Martinique   Encounter Date: 05/15/2021    Past Medical History:  Diagnosis Date   Barrett's esophagus    Chronic back pain    Depression    Gastritis    Mild   Gastroparesis    GERD (gastroesophageal reflux disease)    Headache    History of kidney stones    Hyperglycemia    Hypertension    Nephrolithiasis    Pneumonia    Covid pneumonia   Renal cyst    Stricture and stenosis of esophagus    Stroke [facility]) 07-2015   Vision abnormalities     Past Surgical History:  Procedure Laterality Date   CERVICAL [place] SURGERY  2012   COLONOSCOPY     LUMBAR [place] SURGERY  2005, 2010   replaced L4 and L5   SHOULDER ARTHROSCOPY WITH ROTATOR CUFF REPAIR Left 08/14/2020   Procedure: SHOULDER ARTHROSCOPY WITH ROTATOR CUFF REPAIR, SUBACROMIAL DECOMPRESSION;  Surgeon: Tania Ade, MD;  Location: [facility];  Service: Orthopedics;  Laterality: Left;   UPPER GASTROINTESTINAL ENDOSCOPY      There were no vitals filed for this visit.       Pt seen for aquatic therapy today.  Treatment took place in water 3.25-4 ft in depth at the [facility] pool. Temp of water was 91.  Pt entered/exited the pool via stairs (step through pattern) independently with bilat rail.   Introduction to water. Had patient stand at different levels so she could feel the bouncy   Warm up: heel/toe walking x4 laps across pool chest deep side stepping x4 laps from shallow to deep.   Exercises; Slow march x20; hip 3-way x20; hip extension x20; hip abduction x20; hip flexion x20, calf raises with balance x20 each leg; double leg squat x20. balance exercises: Romberg 3x20sec eyes closed with perturbations, tandem stance eyes  closed 2x20sec each with left and right leg in front, single leg stance 2x15sec each leg eyes opened; shoulder assisted flexion and abduction (water board) 3x10. Seated water board stretched 2x10. Dumbbell water extension 2x15. Forward Step Ups with 2 second hold for balance 2x5 for each foot.      Pt requires buoyancy for support and to offload joints with strengthening exercises. Viscosity of the water is needed for resistance of strengthening; water current perturbations provides challenge to standing balance unsupported, requiring increased core activation                         PT Short Term Goals - 04/24/21 1722       PT SHORT TERM GOAL #1   Title Patient will increase BERG test to 36/56    Time 3    Period Weeks    Status New    Target Date 06/05/21      PT SHORT TERM GOAL #2   Title Patient will increase left LE strength to 5/5    Time 3    Period Weeks    Status New    Target Date 05/15/21      PT SHORT TERM GOAL #3   Title Patient will demonstrate 20 degrees of left shoulder flexion ( may not be potential but we will perfrom light strengthening in the pool)  Time 3    Period Weeks    Status New    Target Date 05/15/21               PT Long Term Goals - 04/24/21 1727       PT LONG TERM GOAL #1   Title Patient will be indepdnent with a full pool program they can carryover at the [facility]    Time 8    Period Weeks    Status New    Target Date 06/19/21      PT LONG TERM GOAL #2   Title Pt will increase Berg from 44/56 or more for improved balance and decreased fall risk.    Time 6    Period Weeks    Status On-going      PT LONG TERM GOAL #3   Title Patient will mabualte 1000' in the community without loss of balance in order to improve safety    Time 6    Period Weeks    Status New    Target Date 06/05/21                    Patient will benefit from skilled therapeutic intervention in order to improve the following  deficits and impairments:     Visit Diagnosis: No diagnosis found.     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) 10/25/2018   Chronic pain syndrome    Chronic bilateral low back pain    Benign essential HTN    Tobacco abuse    Marijuana abuse    Hyperlipidemia    History of CVA with residual deficit    Dysphagia, post-stroke    ICH (intracerebral hemorrhage) ([place]) - R thalmic/PLIC d/t HTN 33/38/3291   Insomnia 07/13/2017   PAD (peripheral artery disease) ([place]) 07/13/2017   Hyperlipidemia with target LDL less than 70 07/13/2017   Stroke-like symptom 09/22/2016   Paresthesia 09/22/2016   CVA (cerebral vascular accident) ([place]) 09/22/2016   Cerebrovascular accident (CVA) due to thrombosis of left carotid artery ([place]) 09/23/2015   Primary snoring 09/23/2015   Hypersomnia with sleep apnea 09/23/2015   Obstructive sleep apnea 09/23/2015   Lacunar infarct, acute ([place]) 08/25/2015   Sleep apnea 08/25/2015   Dysarthria    CVD (cardiovascular disease) 08/02/2015   Acute hyperglycemia 91/66/0600   Systolic hypertension with cerebrovascular disease 12/27/2014   Epistaxis 07/28/2012   HYPERGLYCEMIA 10/02/2010   NEPHROLITHIASIS 05/23/2009   BARRETTS ESOPHAGUS 02/20/2009   Gastroparesis 02/20/2009   RADICULOPATHY 10/21/2008   GERD 10/08/2008   ESOPHAGITIS 08/15/2008   ESOPHAGEAL STRICTURE 08/15/2008   GASTRITIS, ACUTE 08/15/2008   RENAL CYST 08/14/2008   TOBACCO ABUSE 08/08/2008   HEMATURIA, MICROSCOPIC, HX OF 08/08/2008   PULMONARY NODULE 01/10/2008    Carney Living PT DPT  05/15/2021, 9:55 AM  [facility] [address] [place], [place], [address] Phone: [phone]   Fax:  [phone]  Name: NAFIS FARNAN MRN: [id number] Date of Birth: 02-07-1951

## 2021-05-20 ENCOUNTER — Other Ambulatory Visit: Payer: Self-pay

## 2021-05-20 ENCOUNTER — Ambulatory Visit (HOSPITAL_BASED_OUTPATIENT_CLINIC_OR_DEPARTMENT_OTHER): Payer: Medicare Other | Admitting: Physical Therapy

## 2021-05-20 ENCOUNTER — Encounter (HOSPITAL_BASED_OUTPATIENT_CLINIC_OR_DEPARTMENT_OTHER): Payer: Self-pay | Admitting: Physical Therapy

## 2021-05-20 DIAGNOSIS — R2689 Other abnormalities of gait and mobility: Secondary | ICD-10-CM | POA: Diagnosis not present

## 2021-05-20 DIAGNOSIS — M6281 Muscle weakness (generalized): Secondary | ICD-10-CM | POA: Diagnosis not present

## 2021-05-20 DIAGNOSIS — I69354 Hemiplegia and hemiparesis following cerebral infarction affecting left non-dominant side: Secondary | ICD-10-CM | POA: Diagnosis not present

## 2021-05-20 DIAGNOSIS — R2681 Unsteadiness on feet: Secondary | ICD-10-CM | POA: Diagnosis not present

## 2021-05-20 DIAGNOSIS — R278 Other lack of coordination: Secondary | ICD-10-CM | POA: Diagnosis not present

## 2021-05-20 NOTE — Therapy (Signed)
[facility] [address] [place], [place], [address] Phone: [phone]   Fax:  [phone]  Physical Therapy Treatment  Patient Details  Name: Richard Vaughn MRN: [id number] Date of Birth: 1951-10-06 Referring Provider (PT): Dr Betty Martinique   Encounter Date: 05/20/2021   PT End of Session - 05/20/21 0853     Visit Number 8    Number of Visits 16    Date for PT Re-Evaluation 06/19/21    Authorization Type Medicare A and B    Activity Tolerance Patient tolerated treatment well    Behavior During Therapy [facility] for tasks assessed/performed             Past Medical History:  Diagnosis Date   Barrett's esophagus    Chronic back pain    Depression    Gastritis    Mild   Gastroparesis    GERD (gastroesophageal reflux disease)    Headache    History of kidney stones    Hyperglycemia    Hypertension    Nephrolithiasis    Pneumonia    Covid pneumonia   Renal cyst    Stricture and stenosis of esophagus    Stroke [facility]) 07-2015   Vision abnormalities     Past Surgical History:  Procedure Laterality Date   CERVICAL [place] SURGERY  2012   COLONOSCOPY     LUMBAR [place] SURGERY  2005, 2010   replaced L4 and L5   SHOULDER ARTHROSCOPY WITH ROTATOR CUFF REPAIR Left 08/14/2020   Procedure: SHOULDER ARTHROSCOPY WITH ROTATOR CUFF REPAIR, SUBACROMIAL DECOMPRESSION;  Surgeon: Tania Ade, MD;  Location: [facility];  Service: Orthopedics;  Laterality: Left;   UPPER GASTROINTESTINAL ENDOSCOPY      There were no vitals filed for this visit.   Subjective Assessment - 05/20/21 0851     Subjective Patient reports his shoulder has been a little sore but overallhe is doing well. We will continue to progress as tolerated.    Patient is accompained by: Family member    Pertinent History CVA, Lower Back Pain    Limitations Walking    How long can you walk comfortably? limited community distances    Diagnostic tests nothing pertinant  sisnce before his shoulder surgery    Patient Stated Goals to become more mobile and get back to normal as much as able    Currently in Pain? Yes    Pain Score 2    visual scale   Pain Location Shoulder    Pain Orientation Left    Pain Descriptors / Indicators Aching    Pain Type Chronic pain    Pain Onset 1 to 4 weeks ago    Pain Frequency Intermittent    Aggravating Factors  standing and walking    Pain Relieving Factors sitting down    Effect of Pain on Daily Activities decreased ability to move    Multiple Pain Sites No               Pt seen for aquatic therapy today.  Treatment took place in water 3.25-4 ft in depth at the [employer] pool. Temp of water was 91.  Pt entered/exited the pool via stairs (step through pattern) independently with bilat rail.   Introduction to water. Had patient stand at different levels so she could feel the bouncy   Warm up: heel/toe walking x4 laps across pool chest deep side stepping x4 laps from shallow to deep;   Exercises; weighted 3 lbs. ankle weights for  following: Slow March x20; hip extension x20; hip-abduction x20. Tandem walk across pool x3 for balance, big marches across pool x3. Steps step up x10 each leg.     Patient completed board trunk flexion x10 lateral board rotation x10 each way seated, shoulder abduction with hand paddle x20. Standing shoulder extension with dumbbell x20.       Pt requires buoyancy for support and to offload joints with strengthening exercises. Viscosity of the water is needed for resistance of strengthening; water current perturbations provides challenge to standing balance unsupported, requiring increased core activation.                       PT Education - 05/20/21 0853     Education Details reviewed water exercises    Person(s) Educated Patient    Methods Explanation;Demonstration;Tactile cues;Verbal cues    Comprehension Verbalized understanding;Returned  demonstration;Verbal cues required              PT Short Term Goals - 04/24/21 1722       PT SHORT TERM GOAL #1   Title Patient will increase BERG test to 36/56    Time 3    Period Weeks    Status New    Target Date 06/05/21      PT SHORT TERM GOAL #2   Title Patient will increase left LE strength to 5/5    Time 3    Period Weeks    Status New    Target Date 05/15/21      PT SHORT TERM GOAL #3   Title Patient will demonstrate 20 degrees of left shoulder flexion ( may not be potential but we will perfrom light strengthening in the pool)    Time 3    Period Weeks    Status New    Target Date 05/15/21               PT Long Term Goals - 04/24/21 1727       PT LONG TERM GOAL #1   Title Patient will be indepdnent with a full pool program they can carryover at the [facility]    Time 8    Period Weeks    Status New    Target Date 06/19/21      PT LONG TERM GOAL #2   Title Pt will increase Berg from 44/56 or more for improved balance and decreased fall risk.    Time 6    Period Weeks    Status On-going      PT LONG TERM GOAL #3   Title Patient will mabualte 1000' in the community without loss of balance in order to improve safety    Time 6    Period Weeks    Status New    Target Date 06/05/21                    Patient will benefit from skilled therapeutic intervention in order to improve the following deficits and impairments:     Visit Diagnosis: No diagnosis found.     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) 10/25/2018   Chronic pain syndrome  Chronic bilateral low back pain    Benign essential HTN    Tobacco abuse    Marijuana abuse     Hyperlipidemia    History of CVA with residual deficit    Dysphagia, post-stroke    ICH (intracerebral hemorrhage) ([facility]) - R thalmic/PLIC d/t HTN 16/08/9603   Insomnia 07/13/2017   PAD (peripheral artery disease) ([place]) 07/13/2017   Hyperlipidemia with target LDL less than 70 07/13/2017   Stroke-like symptom 09/22/2016   Paresthesia 09/22/2016   CVA (cerebral vascular accident) ([place]) 09/22/2016   Cerebrovascular accident (CVA) due to thrombosis of left carotid artery ([place]) 09/23/2015   Primary snoring 09/23/2015   Hypersomnia with sleep apnea 09/23/2015   Obstructive sleep apnea 09/23/2015   Lacunar infarct, acute ([place]) 08/25/2015   Sleep apnea 08/25/2015   Dysarthria    CVD (cardiovascular disease) 08/02/2015   Acute hyperglycemia 54/07/8118   Systolic hypertension with cerebrovascular disease 12/27/2014   Epistaxis 07/28/2012   HYPERGLYCEMIA 10/02/2010   NEPHROLITHIASIS 05/23/2009   BARRETTS ESOPHAGUS 02/20/2009   Gastroparesis 02/20/2009   RADICULOPATHY 10/21/2008   GERD 10/08/2008   ESOPHAGITIS 08/15/2008   ESOPHAGEAL STRICTURE 08/15/2008   GASTRITIS, ACUTE 08/15/2008   RENAL CYST 08/14/2008   TOBACCO ABUSE 08/08/2008   HEMATURIA, MICROSCOPIC, HX OF 08/08/2008   PULMONARY NODULE 01/10/2008    Carney Living PT DPT  05/20/2021, 9:27 AM  [facility] [place], [place], [address] Phone: [phone]   Fax:  [phone]  Name: JACODY BENEKE MRN: [id number] Date of Birth: 1951-10-24

## 2021-05-22 ENCOUNTER — Encounter (HOSPITAL_BASED_OUTPATIENT_CLINIC_OR_DEPARTMENT_OTHER): Payer: Self-pay | Admitting: Physical Therapy

## 2021-05-22 ENCOUNTER — Other Ambulatory Visit: Payer: Self-pay

## 2021-05-22 ENCOUNTER — Ambulatory Visit (HOSPITAL_BASED_OUTPATIENT_CLINIC_OR_DEPARTMENT_OTHER): Payer: Medicare Other | Admitting: Physical Therapy

## 2021-05-22 DIAGNOSIS — I69354 Hemiplegia and hemiparesis following cerebral infarction affecting left non-dominant side: Secondary | ICD-10-CM | POA: Diagnosis not present

## 2021-05-22 DIAGNOSIS — M6281 Muscle weakness (generalized): Secondary | ICD-10-CM | POA: Diagnosis not present

## 2021-05-22 DIAGNOSIS — R2681 Unsteadiness on feet: Secondary | ICD-10-CM | POA: Diagnosis not present

## 2021-05-22 DIAGNOSIS — R2689 Other abnormalities of gait and mobility: Secondary | ICD-10-CM | POA: Diagnosis not present

## 2021-05-22 DIAGNOSIS — R278 Other lack of coordination: Secondary | ICD-10-CM

## 2021-05-23 NOTE — Therapy (Signed)
[place] [address] [place], [place], [address] Phone: [phone]   Fax:  [phone]  Physical Therapy Treatment  Patient Details  Name: Richard Vaughn MRN: [id number] Date of Birth: 1951/11/12 Referring Provider (PT): Dr Betty Martinique   Encounter Date: 05/22/2021   PT End of Session - 05/22/21 0932     Visit Number 9    Number of Visits 16    Date for PT Re-Evaluation 06/19/21    Authorization Type Medicare A and B    PT Start Time [id number]   Patient 8 minutes late   PT Stop Time 0931    PT Time Calculation (min) 38 min    Activity Tolerance Patient tolerated treatment well    Behavior During Therapy [facility] for tasks assessed/performed             Past Medical History:  Diagnosis Date   Barrett's esophagus    Chronic back pain    Depression    Gastritis    Mild   Gastroparesis    GERD (gastroesophageal reflux disease)    Headache    History of kidney stones    Hyperglycemia    Hypertension    Nephrolithiasis    Pneumonia    Covid pneumonia   Renal cyst    Stricture and stenosis of esophagus    Stroke [facility]) 07-2015   Vision abnormalities     Past Surgical History:  Procedure Laterality Date   CERVICAL [place] SURGERY  2012   COLONOSCOPY     LUMBAR [place] SURGERY  2005, 2010   replaced L4 and L5   SHOULDER ARTHROSCOPY WITH ROTATOR CUFF REPAIR Left 08/14/2020   Procedure: SHOULDER ARTHROSCOPY WITH ROTATOR CUFF REPAIR, SUBACROMIAL DECOMPRESSION;  Surgeon: Tania Ade, MD;  Location: [facility];  Service: Orthopedics;  Laterality: Left;   UPPER GASTROINTESTINAL ENDOSCOPY      There were no vitals filed for this visit.   Subjective Assessment - 05/22/21 2104     Subjective Patient has no complaints    Patient is accompained by: Family member    Pertinent History CVA, Lower Back Pain    Limitations Walking    How long can you stand comfortably? limited time    How long can you walk comfortably? limited  community distances    Diagnostic tests nothing pertinant sisnce before his shoulder surgery    Patient Stated Goals to become more mobile and get back to normal as much as able    Currently in Pain? No/denies                     Pt seen for aquatic therapy today.  Treatment took place in water 3.25-4 ft in depth at the [employer] pool. Temp of water was 91.  Pt entered/exited the pool via stairs (step through pattern) independently with bilat rail.   Introduction to water. Had patient stand at different levels so she could feel the bouncy   Warm up: heel/toe walking x4 laps across pool chest deep side stepping x4 laps from shallow to deep; suing 5.5 weights    Exercises; weighted 3 lbs. ankle weights for following: Slow March x20; hip extension x20; hip-abduction x20. Tandem walk across pool x3 for balance, big marches across pool x3. Steps step up x15 each leg.  Side steps started wth weights but patient fatigued.        Patient completed board trunk flexion x10 lateral board rotation x10 each way seated, shoulder abduction  with hand paddle x20. Standing shoulder extension with dumbbell x20.      Pt requires buoyancy for support and to offload joints with strengthening exercises. Viscosity of the water is needed for resistance of strengthening; water current perturbations provides challenge to standing balance unsupported, requiring increased core activation.                    PT Education - 05/22/21 2105     Education Details plan of car egoing forward    Person(s) Educated Patient    Methods Explanation;Demonstration;Tactile cues;Verbal cues    Comprehension Verbalized understanding;Returned demonstration;Verbal cues required;Tactile cues required              PT Short Term Goals - 04/24/21 1722       PT SHORT TERM GOAL #1   Title Patient will increase BERG test to 36/56    Time 3    Period Weeks    Status New    Target Date  06/05/21      PT SHORT TERM GOAL #2   Title Patient will increase left LE strength to 5/5    Time 3    Period Weeks    Status New    Target Date 05/15/21      PT SHORT TERM GOAL #3   Title Patient will demonstrate 20 degrees of left shoulder flexion ( may not be potential but we will perfrom light strengthening in the pool)    Time 3    Period Weeks    Status New    Target Date 05/15/21               PT Long Term Goals - 04/24/21 1727       PT LONG TERM GOAL #1   Title Patient will be indepdnent with a full pool program they can carryover at the [facility]    Time 8    Period Weeks    Status New    Target Date 06/19/21      PT LONG TERM GOAL #2   Title Pt will increase Berg from 44/56 or more for improved balance and decreased fall risk.    Time 6    Period Weeks    Status On-going      PT LONG TERM GOAL #3   Title Patient will mabualte 1000' in the community without loss of balance in order to improve safety    Time 6    Period Weeks    Status New    Target Date 06/05/21                   Plan - 05/22/21 2108     Clinical Impression Statement Patient continues to progress well. 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 He perfromed ambualtion with weights on. He fatigued perfroming step up with 5/5 lbs weights. His shoulder mves well in the water. We will perfrom a progress note and re-assessment next visit.    Stability/Clinical Decision Making Evolving/Moderate complexity    Clinical Decision Making Moderate    Rehab Potential Good    PT Frequency 2x / week    PT Duration 8 weeks    PT Treatment/Interventions ADLs/Self Care Home Management;Aquatic Therapy;Electrical Stimulation;Cryotherapy;Moist Heat;Ultrasound;Gait training;Functional mobility training;Therapeutic activities;Therapeutic exercise;Neuromuscular re-education;Patient/family education;Manual techniques;Passive range of motion;Spinal Manipulations    PT Next Visit Plan  Incorporate stair training and perhaps adding land therapy exercises with machines. Continue to improve strenght with shoulder flexion and abduction.  PT Home Exercise Plan Access Code: [email or web address]  URL: [email or web address]  Date: 04/27/2021  Prepared by: Denton Meek    Exercises  Backward Walking - 1 x daily - 7 x weekly - 3 sets - 10 reps  Forward March - 1 x daily - 7 x weekly - 3 sets - 10 reps  Side Stepping - 1 x daily - 7 x weekly - 3 sets - 10 reps  Jumping Jacks - 1 x daily - 7 x weekly - 3 sets - 10 reps  Standing Hip Circles at [employer] - 1 x daily - 7 x weekly - 3 sets - 10 reps    Consulted and Agree with Plan of Care Patient             Patient will benefit from skilled therapeutic intervention in order to improve the following deficits and impairments:  Decreased strength, Difficulty walking, Decreased balance, Decreased coordination  Visit Diagnosis: Muscle weakness (generalized)  Unsteadiness on feet  Hemiplegia and hemiparesis following cerebral infarction affecting left non-dominant side (HCC)  Other abnormalities of gait and mobility  Other lack of coordination     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) 10/25/2018   Chronic pain syndrome    Chronic bilateral low back pain    Benign essential HTN    Tobacco abuse    Marijuana abuse    Hyperlipidemia    History of CVA with residual deficit    Dysphagia, post-stroke    ICH (intracerebral hemorrhage) ([place]) - R thalmic/PLIC d/t HTN 78/24/2353   Insomnia 07/13/2017   PAD (peripheral artery disease) ([place]) 07/13/2017   Hyperlipidemia with target LDL less than 70 07/13/2017   Stroke-like symptom  09/22/2016   Paresthesia 09/22/2016   CVA (cerebral vascular accident) ([place]) 09/22/2016   Cerebrovascular accident (CVA) due to thrombosis of left carotid artery ([place]) 09/23/2015   Primary snoring 09/23/2015   Hypersomnia with sleep apnea 09/23/2015   Obstructive sleep apnea 09/23/2015   Lacunar infarct, acute ([place]) 08/25/2015   Sleep apnea 08/25/2015   Dysarthria    CVD (cardiovascular disease) 08/02/2015   Acute hyperglycemia 61/44/3154   Systolic hypertension with cerebrovascular disease 12/27/2014   Epistaxis 07/28/2012   HYPERGLYCEMIA 10/02/2010   NEPHROLITHIASIS 05/23/2009   BARRETTS ESOPHAGUS 02/20/2009   Gastroparesis 02/20/2009   RADICULOPATHY 10/21/2008   GERD 10/08/2008   ESOPHAGITIS 08/15/2008   ESOPHAGEAL STRICTURE 08/15/2008   GASTRITIS, ACUTE 08/15/2008   RENAL CYST 08/14/2008   TOBACCO ABUSE 08/08/2008   HEMATURIA, MICROSCOPIC, HX OF 08/08/2008   PULMONARY NODULE 01/10/2008    Carney Living 05/23/2021, 1:43 PM  [facility] [address] [place], [place], [address] Phone: [phone]   Fax:  [phone]  Name: VICTORY STROLLO MRN: [id number] Date of Birth: [birth year]

## 2021-05-26 ENCOUNTER — Ambulatory Visit (INDEPENDENT_AMBULATORY_CARE_PROVIDER_SITE_OTHER): Payer: Medicare Other | Admitting: Psychology

## 2021-05-26 ENCOUNTER — Encounter (HOSPITAL_BASED_OUTPATIENT_CLINIC_OR_DEPARTMENT_OTHER): Payer: Self-pay | Admitting: Physical Therapy

## 2021-05-26 ENCOUNTER — Ambulatory Visit (HOSPITAL_BASED_OUTPATIENT_CLINIC_OR_DEPARTMENT_OTHER): Payer: Medicare Other | Admitting: Physical Therapy

## 2021-05-26 ENCOUNTER — Other Ambulatory Visit: Payer: Self-pay

## 2021-05-26 DIAGNOSIS — M6281 Muscle weakness (generalized): Secondary | ICD-10-CM

## 2021-05-26 DIAGNOSIS — F4323 Adjustment disorder with mixed anxiety and depressed mood: Secondary | ICD-10-CM

## 2021-05-26 DIAGNOSIS — R278 Other lack of coordination: Secondary | ICD-10-CM | POA: Diagnosis not present

## 2021-05-26 DIAGNOSIS — R2681 Unsteadiness on feet: Secondary | ICD-10-CM | POA: Diagnosis not present

## 2021-05-26 DIAGNOSIS — R2689 Other abnormalities of gait and mobility: Secondary | ICD-10-CM

## 2021-05-26 DIAGNOSIS — I69354 Hemiplegia and hemiparesis following cerebral infarction affecting left non-dominant side: Secondary | ICD-10-CM

## 2021-05-26 NOTE — Therapy (Signed)
[facility] [address] [place], [place], [address] Phone: [phone]   Fax:  [phone]  Physical Therapy Treatment/Progress Note   Patient Details  Name: Richard Vaughn MRN: [id number] Date of Birth: 1951/05/26 Referring Provider (PT): Dr Betty Martinique Progress Note Reporting Period 04/24/2021 to 05/26/2021  See note below for Objective Data and Assessment of Progress/Goals.       Encounter Date: 05/26/2021   PT End of Session - 05/26/21 1127     Visit Number 10    Number of Visits 18    Date for PT Re-Evaluation 06/23/21    Authorization Type Medicare A and B    PT Start Time 0933    PT Stop Time 1013    PT Time Calculation (min) 40 min    Equipment Utilized During Treatment Gait belt    Activity Tolerance Patient tolerated treatment well    Behavior During Therapy WFL for tasks assessed/performed             Past Medical History:  Diagnosis Date   Barrett's esophagus    Chronic back pain    Depression    Gastritis    Mild   Gastroparesis    GERD (gastroesophageal reflux disease)    Headache    History of kidney stones    Hyperglycemia    Hypertension    Nephrolithiasis    Pneumonia    Covid pneumonia   Renal cyst    Stricture and stenosis of esophagus    Stroke [facility]) 07-2015   Vision abnormalities     Past Surgical History:  Procedure Laterality Date   CERVICAL [place] SURGERY  2012   [place] SURGERY  2005, 2010   replaced L4 and L5   SHOULDER ARTHROSCOPY WITH ROTATOR CUFF REPAIR Left 08/14/2020   Procedure: SHOULDER ARTHROSCOPY WITH ROTATOR CUFF REPAIR, SUBACROMIAL DECOMPRESSION;  Surgeon: Tania Ade, MD;  Location: [facility];  Service: Orthopedics;  Laterality: Left;   UPPER GASTROINTESTINAL ENDOSCOPY      There were no vitals filed for this visit.   Subjective Assessment - 05/26/21 1007     Subjective Patient has no complaints of pain in shoulder but would like to  improve strength of shoulder. Patient states that he is eager to work on exercises on land. Patient states that he able to walk 1 mile with his cane at home with rest in between.    Patient is accompained by: Family member    Pertinent History CVA, Lower Back Pain    Limitations Walking    How long can you stand comfortably? limited time    How long can you walk comfortably? limited community distances    Diagnostic tests nothing pertinant sisnce before his shoulder surgery    Patient Stated Goals to become more mobile and get back to normal as much as able, increase distance he is able to walk, improve ROM of shoulder    Currently in Pain? No/denies    Pain Location Shoulder    Multiple Pain Sites No                [facility] PT Assessment - 05/26/21 0001       AROM   Overall AROM Comments 70 degrees AROM      Strength   Right Hip Flexion 5/5    Right Hip ABduction 5/5    Right Hip ADduction 5/5    Left Hip Flexion 4+/5    Left Hip  ABduction 4+/5    Left Hip ADduction 5/5      Berg Balance Test   Sit to Stand Able to stand  independently using hands    Standing Unsupported Able to stand safely 2 minutes    Sitting with Back Unsupported but Feet Supported on Floor or Stool Able to sit safely and securely 2 minutes    Stand to Sit Controls descent by using hands    Transfers Able to transfer safely, definite need of hands    Standing Unsupported with Eyes Closed Able to stand 10 seconds safely    Standing Unsupported with Feet Together Able to place feet together independently and stand 1 minute safely    From Standing, Reach Forward with Outstretched Arm Can reach forward >12 cm safely (5")    From Standing Position, Pick up Object from Floor Able to pick up shoe, needs supervision    From Standing Position, Turn to Look Behind Over each Shoulder Looks behind from both sides and weight shifts well    Turn 360 Degrees Able to turn 360 degrees safely but slowly    Standing  Unsupported, Alternately Place Feet on Step/Stool Needs assistance to keep from falling or unable to try    Standing Unsupported, One Foot in Front Able to take small step independently and hold 30 seconds    Standing on One Leg Tries to lift leg/unable to hold 3 seconds but remains standing independently    Total Score 40                           [facility] Adult PT Treatment/Exercise - 05/26/21 0001       Exercises   Exercises Knee/Hip;Shoulder      Knee/Hip Exercises: Aerobic   Nustep 6 mins on level 4      Shoulder Exercises: Seated   Elevation AROM;Both;5 reps      Shoulder Exercises: Pulleys   Flexion Limitations 4 sets of 5 on left shoulder   with 3 second holds at end range                   PT Education - 05/26/21 1126     Education Details Reviewed exercises for shoulder and also plan of care for next visits. Also reviewed the results of BERG balance test and improvements.    Person(s) Educated Patient    Methods Explanation;Demonstration;Tactile cues;Verbal cues    Comprehension Returned demonstration;Verbalized understanding;Verbal cues required;Tactile cues required              PT Short Term Goals - 05/26/21 1138       PT SHORT TERM GOAL #1   Title Patient will increase BERG test to 36/56    Baseline 40 on BERG Test 05/26/21    Time 3    Period Weeks    Status Achieved    Target Date 06/05/21      PT SHORT TERM GOAL #2   Title Patient will increase left LE strength to 5/5    Baseline 5/5 abduction, extension. 4+/5 hip flexion.    Time 3    Period Weeks    Status Partially Met    Target Date 05/15/21      PT SHORT TERM GOAL #3   Title Patient will demonstrate 20 degrees of left shoulder flexion ( may not be potential but we will perfrom light strengthening in the pool)    Baseline 70 degrees    Time  3    Period Weeks    Status Achieved    Target Date 05/15/21      PT SHORT TERM GOAL #4   Title Patient will improve BERG  balance test to 45.    Baseline 40    Time 3    Period Weeks    Status New    Target Date 06/16/21      PT SHORT TERM GOAL #5   Title Patient will increase range of motion of left shoulder in flexion to 90 degrees.    Baseline 70    Time 3    Period Weeks    Status New    Target Date 06/16/21               PT Long Term Goals - 05/26/21 1447       PT LONG TERM GOAL #1   Title Patient will be indepdnent with a full pool program they can carryover at the [facility] Patient reports understanding of aquatics HEP during treatment.    Time 6    Period Weeks    Status On-going    Target Date 07/07/21      PT LONG TERM GOAL #2   Title Pt will increase Berg from 44/56 or more for improved balance and decreased fall risk.    Baseline 40    Time 6    Period Weeks    Status On-going    Target Date 07/07/21      PT LONG TERM GOAL #3   Title Patient will ambulate 1000' in the community without loss of balance in order to improve safety    Baseline Patient states he walks 1 mile with improved balance    Time 6    Period Weeks    Status Achieved      PT LONG TERM GOAL #4   Title Pt will increase Lt shoulder active range of motion to 120 degrees to demostrate functional mobility with left shoulder.    Baseline 70 degrees with discomfort    Time 6    Period Weeks    Status New    Target Date 07/07/21      PT LONG TERM GOAL #5   Title Patient will have 5/5 strength throughlout lower extremity to ambulate around community safely.    Baseline 4+/5 left hip flexion and 4+/5 left hip abduction    Time 6    Period Weeks    Status New    Target Date 07/07/21                   Plan - 05/26/21 1129     Clinical Impression Statement Patient tolerated treatment well and had complaints of discomfort in shoulder. Patient continues to improve balance and strength of upper and lower extremity. BERG balance test showed 10 points improvement from 30 to 40. Patients  strength has also improved in left and right hip. Shoulder strength as improved with AROM. Patient is able to elevate arm 70 degrees compared to not being able to last time. At this time, patient should continue physical therapy to continue to improve strength of lower extremity and shoulder. Both land and aquatics are recommended for the patient at this time. We will continue with skilled therapy 2W4    Personal Factors and Comorbidities Comorbidity 1;Comorbidity 2    Comorbidities CVA, cardiovascular disease, stroke,    Examination-Activity Limitations Squat;Stairs;Reach Overhead    Examination-Participation Restrictions Community Activity;Volunteer  Stability/Clinical Decision Making Evolving/Moderate complexity    Clinical Decision Making Moderate    Rehab Potential Good    PT Frequency 2x / week    PT Duration 8 weeks    PT Treatment/Interventions ADLs/Self Care Home Management;Aquatic Therapy;Electrical Stimulation;Cryotherapy;Moist Heat;Ultrasound;Gait training;Functional mobility training;Therapeutic activities;Therapeutic exercise;Neuromuscular re-education;Patient/family education;Manual techniques;Passive range of motion;Spinal Manipulations    PT Next Visit Plan Incorporate stair training and step ups in the pool. Add land therapy exercises with machines for UE and LE. Continue to improve strength of shoulder flexion and abduction.    PT Home Exercise Plan Access Code: [email or web address]  URL: [email or web address]  Date: 04/27/2021  Prepared by: Denton Meek    Exercises  Backward Walking - 1 x daily - 7 x weekly - 3 sets - 10 reps  Forward March - 1 x daily - 7 x weekly - 3 sets - 10 reps  Side Stepping - 1 x daily - 7 x weekly - 3 sets - 10 reps  Jumping Jacks - 1 x daily - 7 x weekly - 3 sets - 10 reps  Standing Hip Circles at [employer] - 1 x daily - 7 x weekly - 3 sets - 10 reps    Consulted and Agree with Plan of Care Patient             Patient will benefit from  skilled therapeutic intervention in order to improve the following deficits and impairments:  Decreased strength, Difficulty walking, Decreased balance, Decreased coordination  Visit Diagnosis: Muscle weakness (generalized)  Hemiplegia and hemiparesis following cerebral infarction affecting left non-dominant side (HCC)  Unsteadiness on feet  Other abnormalities of gait and mobility     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) 10/25/2018   Chronic pain syndrome    Chronic bilateral low back pain    Benign essential HTN    Tobacco abuse    Marijuana abuse    Hyperlipidemia    History of CVA with residual deficit    Dysphagia, post-stroke    ICH (intracerebral hemorrhage) ([place]) - R thalmic/PLIC d/t HTN 23/95/3202   Insomnia 07/13/2017   PAD (peripheral artery disease) ([place]) 07/13/2017   Hyperlipidemia with target LDL less than 70 07/13/2017   Stroke-like symptom 09/22/2016   Paresthesia 09/22/2016   CVA (cerebral vascular accident) ([place]) 09/22/2016   Cerebrovascular accident (CVA) due to thrombosis of left carotid artery ([place]) 09/23/2015   Primary snoring 09/23/2015   Hypersomnia with sleep apnea 09/23/2015   Obstructive sleep apnea 09/23/2015   Lacunar infarct, acute ([place]) 08/25/2015   Sleep apnea 08/25/2015   Dysarthria    CVD (cardiovascular disease) 08/02/2015   Acute hyperglycemia 33/43/5686   Systolic hypertension with cerebrovascular disease 12/27/2014   Epistaxis 07/28/2012   HYPERGLYCEMIA 10/02/2010   NEPHROLITHIASIS 05/23/2009   BARRETTS ESOPHAGUS 02/20/2009   Gastroparesis 02/20/2009   RADICULOPATHY 10/21/2008   GERD 10/08/2008   ESOPHAGITIS 08/15/2008    ESOPHAGEAL STRICTURE 08/15/2008   GASTRITIS, ACUTE 08/15/2008   RENAL CYST 08/14/2008   TOBACCO ABUSE 08/08/2008   HEMATURIA, MICROSCOPIC, HX OF 08/08/2008   PULMONARY NODULE 01/10/2008    Carney Living PT DPT  05/26/2021, 4:34 PM  Davis Gourd  SPT  05/26/2021  During this treatment session, the therapist was present, participating in and directing the treatment.   [facility] [address] [place], [place], [address] Phone: [phone]   Fax:  [phone]  Name: Richard Vaughn MRN: [id number] Date of Birth: 1950/11/25

## 2021-05-28 ENCOUNTER — Ambulatory Visit (HOSPITAL_BASED_OUTPATIENT_CLINIC_OR_DEPARTMENT_OTHER): Payer: Medicare Other | Admitting: Physical Therapy

## 2021-05-28 ENCOUNTER — Other Ambulatory Visit: Payer: Self-pay

## 2021-05-28 DIAGNOSIS — M6281 Muscle weakness (generalized): Secondary | ICD-10-CM | POA: Diagnosis not present

## 2021-05-28 DIAGNOSIS — R2689 Other abnormalities of gait and mobility: Secondary | ICD-10-CM

## 2021-05-28 DIAGNOSIS — R2681 Unsteadiness on feet: Secondary | ICD-10-CM

## 2021-05-28 DIAGNOSIS — I69354 Hemiplegia and hemiparesis following cerebral infarction affecting left non-dominant side: Secondary | ICD-10-CM | POA: Diagnosis not present

## 2021-05-28 DIAGNOSIS — R278 Other lack of coordination: Secondary | ICD-10-CM | POA: Diagnosis not present

## 2021-05-29 ENCOUNTER — Encounter (HOSPITAL_BASED_OUTPATIENT_CLINIC_OR_DEPARTMENT_OTHER): Payer: Self-pay | Admitting: Physical Therapy

## 2021-05-29 NOTE — Therapy (Signed)
[facility] [address] [place], [place], [address] Phone: [phone]   Fax:  [phone]  Physical Therapy Treatment  Patient Details  Name: Richard Vaughn MRN: [id number] Date of Birth: 22-Dec-1950 Referring Provider (PT): Dr Betty Martinique   Encounter Date: 05/28/2021   PT End of Session - 05/29/21 1202     Visit Number 11    Number of Visits 18    Date for PT Re-Evaluation 06/23/21    PT Start Time 3154   Patient 4 minutes late   PT Stop Time 1100    PT Time Calculation (min) 41 min    Activity Tolerance Patient tolerated treatment well    Behavior During Therapy [facility] for tasks assessed/performed             Past Medical History:  Diagnosis Date   Barrett's esophagus    Chronic back pain    Depression    Gastritis    Mild   Gastroparesis    GERD (gastroesophageal reflux disease)    Headache    History of kidney stones    Hyperglycemia    Hypertension    Nephrolithiasis    Pneumonia    Covid pneumonia   Renal cyst    Stricture and stenosis of esophagus    Stroke [facility]) 07-2015   Vision abnormalities     Past Surgical History:  Procedure Laterality Date   CERVICAL [place]GERY  2012   COLONOSCOPY     LUMBAR [place] SURGERY  2005, 2010   replaced L4 and L5   SHOULDER ARTHROSCOPY WITH ROTATOR CUFF REPAIR Left 08/14/2020   Procedure: SHOULDER ARTHROSCOPY WITH ROTATOR CUFF REPAIR, SUBACROMIAL DECOMPRESSION;  Surgeon: Tania Ade, MD;  Location: [facility];  Service: Orthopedics;  Laterality: Left;   UPPER GASTROINTESTINAL ENDOSCOPY      There were no vitals filed for this visit.   Subjective Assessment - 05/29/21 1154     Subjective Patient continues to have no complaints. He reports no back pain. he feels like his balance has been pretty good.    Pertinent History CVA, Lower Back Pain    Limitations Walking    How long can you stand comfortably? limited time    How long can you walk comfortably?  limited community distances    Diagnostic tests nothing pertinant sisnce before his shoulder surgery    Patient Stated Goals to become more mobile and get back to normal as much as able, increase distance he is able to walk, improve ROM of shoulder    Currently in Pain? No/denies                          Pt seen for aquatic therapy today.  Treatment took place in water 3.25-4 ft in depth at the [facility] pool. Temp of water was 91.  Pt entered/exited the pool via stairs (step through pattern) independently with bilat rail.     Warm up: heel/toe walking x4 laps across pool chest deep side stepping x4 laps from shallow to deep.   Exercises; Slow march x20; hip 3-way x20; hip extension x20; hip abduction x20; hip flexion x20, calf raises with balance x20 each leg; double leg squat x20.   balance exercises: Romberg 3x20sec eyes closed with perturbations, tandem stance eyes closed 2x20sec each with left and right leg in front, single leg stance 2x15sec each leg eyes opened;   shoulder assisted flexion and abduction (water board) 3x10. Seated water  board stretched 2x10.   Dumbbell water extension 2x15. Forward Step Ups x20 5 lbs weight; lateral step up x20 5 lbs weight      Pt requires buoyancy for support and to offload joints with strengthening exercises. Viscosity of the water is needed for resistance of strengthening; water current perturbations provides challenge to standing balance unsupported, requiring increased core activation             PT Education - 05/29/21 1155     Education Details HEP and symptom mangement    Person(s) Educated Patient    Methods Explanation;Demonstration;Tactile cues;Verbal cues    Comprehension Verbalized understanding;Returned demonstration;Verbal cues required;Tactile cues required              PT Short Term Goals - 05/26/21 1138       PT SHORT TERM GOAL #1   Title Patient will increase BERG test to 36/56     Baseline 40 on BERG Test 05/26/21    Time 3    Period Weeks    Status Achieved    Target Date 06/05/21      PT SHORT TERM GOAL #2   Title Patient will increase left LE strength to 5/5    Baseline 5/5 abduction, extension. 4+/5 hip flexion.    Time 3    Period Weeks    Status Partially Met    Target Date 05/15/21      PT SHORT TERM GOAL #3   Title Patient will demonstrate 20 degrees of left shoulder flexion ( may not be potential but we will perfrom light strengthening in the pool)    Baseline 70 degrees    Time 3    Period Weeks    Status Achieved    Target Date 05/15/21      PT SHORT TERM GOAL #4   Title Patient will improve BERG balance test to 45.    Baseline 40    Time 3    Period Weeks    Status New    Target Date 06/16/21      PT SHORT TERM GOAL #5   Title Patient will increase range of motion of left shoulder in flexion to 90 degrees.    Baseline 70    Time 3    Period Weeks    Status New    Target Date 06/16/21               PT Long Term Goals - 05/26/21 1447       PT LONG TERM GOAL #1   Title Patient will be indepdnent with a full pool program they can carryover at the [facility] Patient reports understanding of aquatics HEP during treatment.    Time 6    Period Weeks    Status On-going    Target Date 07/07/21      PT LONG TERM GOAL #2   Title Pt will increase Berg from 44/56 or more for improved balance and decreased fall risk.    Baseline 40    Time 6    Period Weeks    Status On-going    Target Date 07/07/21      PT LONG TERM GOAL #3   Title Patient will ambulate 1000' in the community without loss of balance in order to improve safety    Baseline Patient states he walks 1 mile with improved balance    Time 6    Period Weeks    Status Achieved  PT LONG TERM GOAL #4   Title Pt will increase Lt shoulder active range of motion to 120 degrees to demostrate functional mobility with left shoulder.    Baseline 70 degrees with  discomfort    Time 6    Period Weeks    Status New    Target Date 07/07/21      PT LONG TERM GOAL #5   Title Patient will have 5/5 strength throughlout lower extremity to ambulate around community safely.    Baseline 4+/5 left hip flexion and 4+/5 left hip abduction    Time 6    Period Weeks    Status New    Target Date 07/07/21                   Plan - 05/29/21 1157     Clinical Impression Statement Patient tolerated treatment well. He had no increase in low back pain. We added some active strengthening to his shoulder using hte paddle. We continue to work on his dynamic balance and balance with eyes closed. Getting out of the pool he tripped backwards on the rug but therapy was able to catch him. We will continue to work balance and strengthening.    Personal Factors and Comorbidities Comorbidity 1;Comorbidity 2    Comorbidities CVA, cardiovascular disease, stroke,    Examination-Activity Limitations Squat;Stairs;Reach Overhead    Examination-Participation Restrictions Community Activity;Volunteer    Stability/Clinical Decision Making Evolving/Moderate complexity    Clinical Decision Making Moderate    Rehab Potential Good    PT Frequency 2x / week    PT Duration 8 weeks    PT Treatment/Interventions ADLs/Self Care Home Management;Aquatic Therapy;Electrical Stimulation;Cryotherapy;Moist Heat;Ultrasound;Gait training;Functional mobility training;Therapeutic activities;Therapeutic exercise;Neuromuscular re-education;Patient/family education;Manual techniques;Passive range of motion;Spinal Manipulations    PT Next Visit Plan Incorporate stair training and step ups in the pool. Add land therapy exercises with machines for UE and LE. Continue to improve strength of shoulder flexion and abduction.    PT Home Exercise Plan Access Code: [email or web address]  URL: [email or web address]  Date: 04/27/2021  Prepared by: Denton Meek    Exercises  Backward Walking - 1 x daily - 7 x  weekly - 3 sets - 10 reps  Forward March - 1 x daily - 7 x weekly - 3 sets - 10 reps  Side Stepping - 1 x daily - 7 x weekly - 3 sets - 10 reps  Jumping Jacks - 1 x daily - 7 x weekly - 3 sets - 10 reps  Standing Hip Circles at [employer] - 1 x daily - 7 x weekly - 3 sets - 10 reps    Consulted and Agree with Plan of Care Patient             Patient will benefit from skilled therapeutic intervention in order to improve the following deficits and impairments:  Decreased strength, Difficulty walking, Decreased balance, Decreased coordination  Visit Diagnosis: Muscle weakness (generalized)  Hemiplegia and hemiparesis following cerebral infarction affecting left non-dominant side (HCC)  Unsteadiness on feet  Other abnormalities of gait and mobility     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) 10/25/2018   Chronic pain syndrome  Chronic bilateral low back pain    Benign essential HTN    Tobacco abuse    Marijuana abuse    Hyperlipidemia    History of CVA with residual deficit    Dysphagia, post-stroke    ICH (intracerebral hemorrhage) (HCC) - R thalmic/PLIC d/t HTN 98/72/1587   Insomnia 07/13/2017   PAD (peripheral artery disease) ([place]) 07/13/2017   Hyperlipidemia with target LDL less than 70 07/13/2017   Stroke-like symptom 09/22/2016   Paresthesia 09/22/2016   CVA (cerebral vascular accident) ([place]) 09/22/2016   Cerebrovascular accident (CVA) due to thrombosis of left carotid artery ([place]) 09/23/2015   Primary snoring 09/23/2015   Hypersomnia with sleep apnea 09/23/2015   Obstructive sleep apnea 09/23/2015   Lacunar infarct, acute ([place]) 08/25/2015   Sleep apnea 08/25/2015    Dysarthria    CVD (cardiovascular disease) 08/02/2015   Acute hyperglycemia 27/61/8485   Systolic hypertension with cerebrovascular disease 12/27/2014   Epistaxis 07/28/2012   HYPERGLYCEMIA 10/02/2010   NEPHROLITHIASIS 05/23/2009   BARRETTS ESOPHAGUS 02/20/2009   Gastroparesis 02/20/2009   RADICULOPATHY 10/21/2008   GERD 10/08/2008   ESOPHAGITIS 08/15/2008   ESOPHAGEAL STRICTURE 08/15/2008   GASTRITIS, ACUTE 08/15/2008   RENAL CYST 08/14/2008   TOBACCO ABUSE 08/08/2008   HEMATURIA, MICROSCOPIC, HX OF 08/08/2008   PULMONARY NODULE 01/10/2008    Carney Living PT DPT  05/29/2021, 12:50 PM    [facility] [address] [place], [place], [address] Phone: [phone]   Fax:  [phone]  Name: Richard Vaughn MRN: [id number] Date of Birth: 1951/10/11

## 2021-06-01 ENCOUNTER — Other Ambulatory Visit: Payer: Self-pay

## 2021-06-01 ENCOUNTER — Encounter (HOSPITAL_BASED_OUTPATIENT_CLINIC_OR_DEPARTMENT_OTHER): Payer: Self-pay | Admitting: Physical Therapy

## 2021-06-01 ENCOUNTER — Ambulatory Visit (HOSPITAL_BASED_OUTPATIENT_CLINIC_OR_DEPARTMENT_OTHER): Payer: Medicare Other | Admitting: Physical Therapy

## 2021-06-01 DIAGNOSIS — R2689 Other abnormalities of gait and mobility: Secondary | ICD-10-CM

## 2021-06-01 DIAGNOSIS — M6281 Muscle weakness (generalized): Secondary | ICD-10-CM | POA: Diagnosis not present

## 2021-06-01 DIAGNOSIS — R2681 Unsteadiness on feet: Secondary | ICD-10-CM

## 2021-06-01 DIAGNOSIS — I69354 Hemiplegia and hemiparesis following cerebral infarction affecting left non-dominant side: Secondary | ICD-10-CM

## 2021-06-01 DIAGNOSIS — R278 Other lack of coordination: Secondary | ICD-10-CM | POA: Diagnosis not present

## 2021-06-01 NOTE — Therapy (Cosign Needed)
[facility] [address] [place], [place], [address] Phone: [phone]   Fax:  [phone]  Physical Therapy Treatment  Patient Details  Name: Richard Vaughn MRN: [id number] Date of Birth: [birth year] Referring Provider (PT): Dr Betty Martinique   Encounter Date: 06/01/2021   PT End of Session - 06/01/21 1707     Visit Number 12    Number of Visits 18    Date for PT Re-Evaluation 06/23/21    Authorization Type Medicare A and B    PT Start Time 4034    PT Stop Time 1344    PT Time Calculation (min) 39 min    Activity Tolerance Patient tolerated treatment well    Behavior During Therapy [facility] for tasks assessed/performed             Past Medical History:  Diagnosis Date   Barrett's esophagus    Chronic back pain    Depression    Gastritis    Mild   Gastroparesis    GERD (gastroesophageal reflux disease)    Headache    History of kidney stones    Hyperglycemia    Hypertension    Nephrolithiasis    Pneumonia    Covid pneumonia   Renal cyst    Stricture and stenosis of esophagus    Stroke [facility]) 07-2015   Vision abnormalities     Past Surgical History:  Procedure Laterality Date   CERVICAL [place] SURGERY  2012   COLONOSCOPY     LUMBAR [place] SURGERY  2005, 2010   replaced L4 and L5   SHOULDER ARTHROSCOPY WITH ROTATOR CUFF REPAIR Left 08/14/2020   Procedure: SHOULDER ARTHROSCOPY WITH ROTATOR CUFF REPAIR, SUBACROMIAL DECOMPRESSION;  Surgeon: Tania Ade, MD;  Location: [facility];  Service: Orthopedics;  Laterality: Left;   UPPER GASTROINTESTINAL ENDOSCOPY      There were no vitals filed for this visit.   Subjective Assessment - 06/01/21 1705     Subjective Patient reports no back pain and no shoulder pain. Patient states he is doing well but feels less confident with his balance.    Patient is accompained by: Family member    Pertinent History CVA, Lower Back Pain    Limitations Walking    How long can you  stand comfortably? limited time    How long can you walk comfortably? limited community distances    Diagnostic tests nothing pertinant sisnce before his shoulder surgery    Patient Stated Goals to become more mobile and get back to normal as much as able, increase distance he is able to walk, improve ROM of shoulder    Currently in Pain? No/denies    Multiple Pain Sites No             Pt seen for aquatic therapy today.  Treatment took place in water 4-4.5 ft in depth at the [facility]. Temp of water was 91.  Pt entered/exited the pool via stairs (step through pattern) independently with bilat rail.     Warm up: heel/toe walking x4 laps across pool chest deep side stepping x4 laps from shallow to deep.   Exercises; Slow march x20; hip 3-way x20; hip extension x20; hip abduction x20; hip flexion x20, calf raises with balance x20 each leg; step ups with each leg x20, Dumbbell water extension 2x15.   balance exercises: Romberg 3x20sec eyes closed with perturbations, tandem stance eyes closed 2x20sec each with left and right leg in front, single leg stance 2x15sec each  leg eyes opened; marches with each leg in front of other 2x across pool.    Shoulder assisted flexion and abduction (water board) 3x10.    Pt requires buoyancy for support and to offload joints with strengthening exercises. Viscosity of the water is needed for resistance of strengthening; water current perturbations provides challenge to standing balance unsupported, requiring increased core activation           PT Education - 06/01/21 1706     Education Details Reviewed aqautic HEP and balance exercises.    Person(s) Educated Patient    Methods Explanation;Demonstration;Tactile cues;Verbal cues    Comprehension Verbalized understanding;Returned demonstration;Verbal cues required;Tactile cues required              PT Short Term Goals - 05/26/21 1138       PT SHORT TERM GOAL #1   Title  Patient will increase BERG test to 36/56    Baseline 40 on BERG Test 05/26/21    Time 3    Period Weeks    Status Achieved    Target Date 06/05/21      PT SHORT TERM GOAL #2   Title Patient will increase left LE strength to 5/5    Baseline 5/5 abduction, extension. 4+/5 hip flexion.    Time 3    Period Weeks    Status Partially Met    Target Date 05/15/21      PT SHORT TERM GOAL #3   Title Patient will demonstrate 20 degrees of left shoulder flexion ( may not be potential but we will perfrom light strengthening in the pool)    Baseline 70 degrees    Time 3    Period Weeks    Status Achieved    Target Date 05/15/21      PT SHORT TERM GOAL #4   Title Patient will improve BERG balance test to 45.    Baseline 40    Time 3    Period Weeks    Status New    Target Date 06/16/21      PT SHORT TERM GOAL #5   Title Patient will increase range of motion of left shoulder in flexion to 90 degrees.    Baseline 70    Time 3    Period Weeks    Status New    Target Date 06/16/21               PT Long Term Goals - 05/26/21 1447       PT LONG TERM GOAL #1   Title Patient will be indepdnent with a full pool program they can carryover at the [facility] Patient reports understanding of aquatics HEP during treatment.    Time 6    Period Weeks    Status On-going    Target Date 07/07/21      PT LONG TERM GOAL #2   Title Pt will increase Berg from 44/56 or more for improved balance and decreased fall risk.    Baseline 40    Time 6    Period Weeks    Status On-going    Target Date 07/07/21      PT LONG TERM GOAL #3   Title Patient will ambulate 1000' in the community without loss of balance in order to improve safety    Baseline Patient states he walks 1 mile with improved balance    Time 6    Period Weeks    Status Achieved  PT LONG TERM GOAL #4   Title Pt will increase Lt shoulder active range of motion to 120 degrees to demostrate functional mobility with  left shoulder.    Baseline 70 degrees with discomfort    Time 6    Period Weeks    Status New    Target Date 07/07/21      PT LONG TERM GOAL #5   Title Patient will have 5/5 strength throughlout lower extremity to ambulate around community safely.    Baseline 4+/5 left hip flexion and 4+/5 left hip abduction    Time 6    Period Weeks    Status New    Target Date 07/07/21                   Plan - 06/01/21 1708     Clinical Impression Statement Patient tolerated treatment well. He no increase in low back pain. We continued to strengthen his shoulder using his paddle. Therapy is progressing balance exercises to include exercises in walking forward and backward. Patient should continue to progress as tolerated and emphasize safety when getting out of pool.    Personal Factors and Comorbidities Comorbidity 1;Comorbidity 2    Comorbidities CVA, cardiovascular disease, stroke,    Examination-Activity Limitations Squat;Stairs;Reach Overhead    Examination-Participation Restrictions Community Activity;Volunteer    Stability/Clinical Decision Making Evolving/Moderate complexity    Clinical Decision Making Moderate    Rehab Potential Good    PT Frequency 2x / week    PT Duration 8 weeks    PT Treatment/Interventions ADLs/Self Care Home Management;Aquatic Therapy;Electrical Stimulation;Cryotherapy;Moist Heat;Ultrasound;Gait training;Functional mobility training;Therapeutic activities;Therapeutic exercise;Neuromuscular re-education;Patient/family education;Manual techniques;Passive range of motion;Spinal Manipulations    PT Next Visit Plan Incorporate stair training and step ups in the pool. Add land therapy exercises with machines for UE and LE. Continue to improve strength of shoulder flexion and abduction.    PT Home Exercise Plan Access Code: [email or web address]  URL: [email or web address]  Date: 04/27/2021  Prepared by: Denton Meek    Exercises  Backward Walking - 1 x daily - 7  x weekly - 3 sets - 10 reps  Forward March - 1 x daily - 7 x weekly - 3 sets - 10 reps  Side Stepping - 1 x daily - 7 x weekly - 3 sets - 10 reps  Jumping Jacks - 1 x daily - 7 x weekly - 3 sets - 10 reps  Standing Hip Circles at [employer] - 1 x daily - 7 x weekly - 3 sets - 10 reps    Consulted and Agree with Plan of Care Patient             Patient will benefit from skilled therapeutic intervention in order to improve the following deficits and impairments:  Decreased strength, Difficulty walking, Decreased balance, Decreased coordination  Visit Diagnosis: Muscle weakness (generalized)  Hemiplegia and hemiparesis following cerebral infarction affecting left non-dominant side (HCC)  Unsteadiness on feet  Other abnormalities of gait and mobility     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) 10/25/2018   Chronic pain syndrome    Chronic bilateral low back pain    Benign essential HTN  Tobacco abuse    Marijuana abuse    Hyperlipidemia    History of CVA with residual deficit    Dysphagia, post-stroke    ICH (intracerebral hemorrhage) (HCC) - R thalmic/PLIC d/t HTN 12/52/7129   Insomnia 07/13/2017   PAD (peripheral artery disease) ([place]) 07/13/2017   Hyperlipidemia with target LDL less than 70 07/13/2017   Stroke-like symptom 09/22/2016   Paresthesia 09/22/2016   CVA (cerebral vascular accident) ([place]) 09/22/2016   Cerebrovascular accident (CVA) due to thrombosis of left carotid artery ([place]) 09/23/2015   Primary snoring 09/23/2015   Hypersomnia with sleep apnea 09/23/2015   Obstructive sleep apnea 09/23/2015   Lacunar infarct, acute ([place]) 08/25/2015   Sleep apnea 08/25/2015    Dysarthria    CVD (cardiovascular disease) 08/02/2015   Acute hyperglycemia 29/07/300   Systolic hypertension with cerebrovascular disease 12/27/2014   Epistaxis 07/28/2012   HYPERGLYCEMIA 10/02/2010   NEPHROLITHIASIS 05/23/2009   BARRETTS ESOPHAGUS 02/20/2009   Gastroparesis 02/20/2009   RADICULOPATHY 10/21/2008   GERD 10/08/2008   ESOPHAGITIS 08/15/2008   ESOPHAGEAL STRICTURE 08/15/2008   GASTRITIS, ACUTE 08/15/2008   RENAL CYST 08/14/2008   TOBACCO ABUSE 08/08/2008   HEMATURIA, MICROSCOPIC, HX OF 08/08/2008   PULMONARY NODULE 01/10/2008    Carolyne Littles PT DPT  06/01/2021  Billey Co SPT 06/01/2021, 5:13 PM  During this treatment session, the therapist was present, participating in and directing the treatment.   [facility] [address] [place], [place], [address] Phone: [phone]   Fax:  [phone]  Name: Richard Vaughn MRN: [id number] Date of Birth: 14-Aug-1951

## 2021-06-02 ENCOUNTER — Encounter (HOSPITAL_BASED_OUTPATIENT_CLINIC_OR_DEPARTMENT_OTHER): Payer: Self-pay | Admitting: Physical Therapy

## 2021-06-03 ENCOUNTER — Ambulatory Visit: Payer: Medicare Other | Admitting: Family Medicine

## 2021-06-03 ENCOUNTER — Ambulatory Visit (HOSPITAL_BASED_OUTPATIENT_CLINIC_OR_DEPARTMENT_OTHER): Payer: Medicare Other | Admitting: Physical Therapy

## 2021-06-03 ENCOUNTER — Other Ambulatory Visit: Payer: Self-pay

## 2021-06-03 ENCOUNTER — Encounter (HOSPITAL_BASED_OUTPATIENT_CLINIC_OR_DEPARTMENT_OTHER): Payer: Self-pay | Admitting: Physical Therapy

## 2021-06-03 DIAGNOSIS — R2681 Unsteadiness on feet: Secondary | ICD-10-CM | POA: Diagnosis not present

## 2021-06-03 DIAGNOSIS — I69354 Hemiplegia and hemiparesis following cerebral infarction affecting left non-dominant side: Secondary | ICD-10-CM

## 2021-06-03 DIAGNOSIS — M6281 Muscle weakness (generalized): Secondary | ICD-10-CM | POA: Diagnosis not present

## 2021-06-03 DIAGNOSIS — R278 Other lack of coordination: Secondary | ICD-10-CM | POA: Diagnosis not present

## 2021-06-03 DIAGNOSIS — R2689 Other abnormalities of gait and mobility: Secondary | ICD-10-CM

## 2021-06-03 NOTE — Therapy (Signed)
[facility] [address] [place], [place], [address] Phone: [phone]   Fax:  [phone]  Physical Therapy Treatment  Patient Details  Name: Richard Vaughn MRN: [id number] Date of Birth: 04-01-1951 Referring Provider (PT): Dr Betty Martinique   Encounter Date: 06/03/2021   PT End of Session - 06/03/21 0947     Visit Number 13    Number of Visits 18    Date for PT Re-Evaluation 06/23/21    Authorization Type Medicare A and B    PT Start Time 0930    PT Stop Time 1012    PT Time Calculation (min) 42 min    Activity Tolerance Patient tolerated treatment well    Behavior During Therapy [facility] for tasks assessed/performed             Past Medical History:  Diagnosis Date   Barrett's esophagus    Chronic back pain    Depression    Gastritis    Mild   Gastroparesis    GERD (gastroesophageal reflux disease)    Headache    History of kidney stones    Hyperglycemia    Hypertension    Nephrolithiasis    Pneumonia    Covid pneumonia   Renal cyst    Stricture and stenosis of esophagus    Stroke [facility]) 07-2015   Vision abnormalities     Past Surgical History:  Procedure Laterality Date   CERVICAL [place] SURGERY  2012   COLONOSCOPY     LUMBAR [place] SURGERY  2005, 2010   replaced L4 and L5   SHOULDER ARTHROSCOPY WITH ROTATOR CUFF REPAIR Left 08/14/2020   Procedure: SHOULDER ARTHROSCOPY WITH ROTATOR CUFF REPAIR, SUBACROMIAL DECOMPRESSION;  Surgeon: Tania Ade, MD;  Location[facility];  Service: Orthopedics;  Laterality: Left;   UPPER GASTROINTESTINAL ENDOSCOPY      There were no vitals filed for this visit.   Subjective Assessment - 06/03/21 0943     Subjective Patient continues to have no complaints. He has had no falls. He has no back pain or no shoulder pain.    Pertinent History CVA, Lower Back Pain    Limitations Walking    How long can you stand comfortably? limited time    How long can you walk comfortably?  limited community distances    Diagnostic tests nothing pertinant sisnce before his shoulder surgery    Patient Stated Goals to become more mobile and get back to normal as much as able, increase distance he is able to walk, improve ROM of shoulder    Currently in Pain? No/denies                    Pt seen for aquatic therapy today.  Treatment took place in water 4-4.5 ft in depth at the [facility]. Temp of water was 91.  Pt entered/exited the pool via stairs (step through pattern) independently with bilat rail.     Warm up: heel/toe walking x4 laps across pool chest deep side stepping x4 laps from shallow to deep.   Exercises; Slow march x20; hip 3-way x20; hip extension x20; hip abduction x20; hip flexion x20, calf raises with balance x20 each leg; step ups with each leg x20, Dumbbell water extension 2x15.   balance exercises: Romberg 3x20sec eyes closed with perturbations, tandem stance eyes closed 2x20sec each with left and right leg in front, single leg stance 2x15sec each leg eyes opened; marches with each leg in front of other  2x across pool.   Shoulder assisted flexion and abduction (water board) 3x10.   Pt requires buoyancy for support and to offload joints with strengthening exercises. Viscosity of the water is needed for resistance of strengthening; water current perturbations provides challenge to standing balance unsupported, requiring increased core activation                     PT Education - 06/03/21 0946     Education Details reviewed HEP and symptom maangement    Person(s) Educated Patient    Methods Explanation;Demonstration;Tactile cues;Verbal cues    Comprehension Verbalized understanding;Returned demonstration;Verbal cues required;Tactile cues required              PT Short Term Goals - 05/26/21 1138       PT SHORT TERM GOAL #1   Title Patient will increase BERG test to 36/56    Baseline 40 on BERG Test 05/26/21     Time 3    Period Weeks    Status Achieved    Target Date 06/05/21      PT SHORT TERM GOAL #2   Title Patient will increase left LE strength to 5/5    Baseline 5/5 abduction, extension. 4+/5 hip flexion.    Time 3    Period Weeks    Status Partially Met    Target Date 05/15/21      PT SHORT TERM GOAL #3   Title Patient will demonstrate 20 degrees of left shoulder flexion ( may not be potential but we will perfrom light strengthening in the pool)    Baseline 70 degrees    Time 3    Period Weeks    Status Achieved    Target Date 05/15/21      PT SHORT TERM GOAL #4   Title Patient will improve BERG balance test to 45.    Baseline 40    Time 3    Period Weeks    Status New    Target Date 06/16/21      PT SHORT TERM GOAL #5   Title Patient will increase range of motion of left shoulder in flexion to 90 degrees.    Baseline 70    Time 3    Period Weeks    Status New    Target Date 06/16/21               PT Long Term Goals - 05/26/21 1447       PT LONG TERM GOAL #1   Title Patient will be indepdnent with a full pool program they can carryover at the [facility] Patient reports understanding of aquatics HEP during treatment.    Time 6    Period Weeks    Status On-going    Target Date 07/07/21      PT LONG TERM GOAL #2   Title Pt will increase Berg from 44/56 or more for improved balance and decreased fall risk.    Baseline 40    Time 6    Period Weeks    Status On-going    Target Date 07/07/21      PT LONG TERM GOAL #3   Title Patient will ambulate 1000' in the community without loss of balance in order to improve safety    Baseline Patient states he walks 1 mile with improved balance    Time 6    Period Weeks    Status Achieved      PT LONG TERM  GOAL #4   Title Pt will increase Lt shoulder active range of motion to 120 degrees to demostrate functional mobility with left shoulder.    Baseline 70 degrees with discomfort    Time 6    Period Weeks     Status New    Target Date 07/07/21      PT LONG TERM GOAL #5   Title Patient will have 5/5 strength throughlout lower extremity to ambulate around community safely.    Baseline 4+/5 left hip flexion and 4+/5 left hip abduction    Time 6    Period Weeks    Status New    Target Date 07/07/21                   Plan - 06/03/21 0947     Clinical Impression Statement Patient continues to tolerate water treatment well. Therapy increased his laps with dyanmic balance tasks. Therapy focused more on tandem walking todayin chest deep water. he had no loss of balance. Patient also perfromed step ups. We will continue to advance patient as tolerated.    Personal Factors and Comorbidities Comorbidity 1;Comorbidity 2    Comorbidities CVA, cardiovascular disease, stroke,    Examination-Activity Limitations Squat;Stairs;Reach Overhead    Stability/Clinical Decision Making Evolving/Moderate complexity    Clinical Decision Making Moderate    Rehab Potential Good    PT Frequency 2x / week    PT Duration 8 weeks    PT Treatment/Interventions ADLs/Self Care Home Management;Aquatic Therapy;Electrical Stimulation;Cryotherapy;Moist Heat;Ultrasound;Gait training;Functional mobility training;Therapeutic activities;Therapeutic exercise;Neuromuscular re-education;Patient/family education;Manual techniques;Passive range of motion;Spinal Manipulations    PT Next Visit Plan Incorporate stair training and step ups in the pool. Add land therapy exercises with machines for UE and LE. Continue to improve strength of shoulder flexion and abduction.    PT Home Exercise Plan Access Code: [email or web address]  URL: [email or web address]  Date: 04/27/2021  Prepared by: Denton Meek    Exercises  Backward Walking - 1 x daily - 7 x weekly - 3 sets - 10 reps  Forward March - 1 x daily - 7 x weekly - 3 sets - 10 reps  Side Stepping - 1 x daily - 7 x weekly - 3 sets - 10 reps  Jumping Jacks - 1 x daily - 7 x weekly - 3  sets - 10 reps  Standing Hip Circles at [employer] - 1 x daily - 7 x weekly - 3 sets - 10 reps    Consulted and Agree with Plan of Care Patient             Patient will benefit from skilled therapeutic intervention in order to improve the following deficits and impairments:  Decreased strength, Difficulty walking, Decreased balance, Decreased coordination  Visit Diagnosis: Muscle weakness (generalized)  Hemiplegia and hemiparesis following cerebral infarction affecting left non-dominant side (HCC)  Unsteadiness on feet  Other abnormalities of gait and mobility     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) 10/25/2018   Chronic pain syndrome    Chronic bilateral low back pain    Benign essential HTN    Tobacco abuse    Marijuana abuse    Hyperlipidemia  History of CVA with residual deficit    Dysphagia, post-stroke    ICH (intracerebral hemorrhage) (HCC) - R thalmic/PLIC d/t HTN 62/44/6950   Insomnia 07/13/2017   PAD (peripheral artery disease) ([place]) 07/13/2017   Hyperlipidemia with target LDL less than 70 07/13/2017   Stroke-like symptom 09/22/2016   Paresthesia 09/22/2016   CVA (cerebral vascular accident) ([place]) 09/22/2016   Cerebrovascular accident (CVA) due to thrombosis of left carotid artery ([place]) 09/23/2015   Primary snoring 09/23/2015   Hypersomnia with sleep apnea 09/23/2015   Obstructive sleep apnea 09/23/2015   Lacunar infarct, acute ([place]) 08/25/2015   Sleep apnea 08/25/2015   Dysarthria    CVD (cardiovascular disease) 08/02/2015   Acute hyperglycemia 72/25/7505   Systolic hypertension with cerebrovascular disease 12/27/2014   Epistaxis 07/28/2012    HYPERGLYCEMIA 10/02/2010   NEPHROLITHIASIS 05/23/2009   BARRETTS ESOPHAGUS 02/20/2009   Gastroparesis 02/20/2009   RADICULOPATHY 10/21/2008   GERD 10/08/2008   ESOPHAGITIS 08/15/2008   ESOPHAGEAL STRICTURE 08/15/2008   GASTRITIS, ACUTE 08/15/2008   RENAL CYST 08/14/2008   TOBACCO ABUSE 08/08/2008   HEMATURIA, MICROSCOPIC, HX OF 08/08/2008   PULMONARY NODULE 01/10/2008    Carney Living 06/03/2021, 10:57 AM  [facility] [place], [place], [address] Phone: [phone]   Fax:  [phone]  Name: BRICEN VICTORY MRN: [id number] Date of Birth: 10/28/1951

## 2021-06-03 NOTE — Therapy (Signed)
[facility] [address] [place], [place], [address] Phone: [phone]   Fax:  [phone]  Physical Therapy Treatment  Patient Details  Name: Richard Vaughn MRN: [id number] Date of Birth: 01-30-1951 Referring Provider (PT): Dr Betty Martinique   Encounter Date: 06/03/2021   PT End of Session - 06/03/21 0947     Visit Number 13    Number of Visits 18    Date for PT Re-Evaluation 06/23/21    Authorization Type Medicare A and B    PT Start Time 0930    PT Stop Time 1012    PT Time Calculation (min) 42 min    Activity Tolerance Patient tolerated treatment well    Behavior During Therapy [facility] for tasks assessed/performed             Past Medical History:  Diagnosis Date   Barrett's esophagus    Chronic back pain    Depression    Gastritis    Mild   Gastroparesis    GERD (gastroesophageal reflux disease)    Headache    History of kidney stones    Hyperglycemia    Hypertension    Nephrolithiasis    Pneumonia    Covid pneumonia   Renal cyst    Stricture and stenosis of esophagus    Stroke [facility]) 07-2015   Vision abnormalities     Past Surgical History:  Procedure Laterality Date   CERVICAL [place] SURGERY  2012   COLONOSCOPY     LUMBAR [place] SURGERY  2005, 2010   replaced L4 and L5   SHOULDER ARTHROSCOPY WITH ROTATOR CUFF REPAIR Left 08/14/2020   Procedure: SHOULDER ARTHROSCOPY WITH ROTATOR CUFF REPAIR, SUBACROMIAL DECOMPRESSION;  Surgeon: Tania Ade, MD;  Location: [facility];  Service: Orthopedics;  Laterality: Left;   UPPER GASTROINTESTINAL ENDOSCOPY      There were no vitals filed for this visit.   Subjective Assessment - 06/03/21 0943     Subjective Patient continues to have no complaints. He has had no falls. He has no back pain or no shoulder pain.    Pertinent History CVA, Lower Back Pain    Limitations Walking    How long can you stand comfortably? limited time    How long can you walk comfortably?  limited community distances    Diagnostic tests nothing pertinant sisnce before his shoulder surgery    Patient Stated Goals to become more mobile and get back to normal as much as able, increase distance he is able to walk, improve ROM of shoulder    Currently in Pain? No/denies              Pt seen for aquatic therapy today.  Treatment took place in water 4-4.5 ft in depth at the [facility]. Temp of water was 91.  Pt entered/exited the pool via stairs (step through pattern) independently with bilat rail.   Warm up: heel/toe walking x4 laps across pool chest deep side stepping x4 laps from shallow to deep. With blue noodle.   Exercises; Slow march x20; hip abduction x20; hip flexion x20, calf raises with balance x20 each leg; step ups with each leg with hip flexion x20, lateral step ups x10 on each leg. Dumbbell water extension 2x20. Noodle push down with each leg x20.   balance exercises: Tandem walking with blue noodle 6x across pool. Big marches with yellow noodle x4.    Shoulder assisted flexion and abduction (water board) 3x10. Shoulder flexion with supination  for resistance x20.    Pt requires buoyancy for support and to offload joints with strengthening exercises. Viscosity of the water is needed for resistance of strengthening; water current perturbations provides challenge to standing balance unsupported, requiring increased core activation       PT Education - 06/03/21 0946     Education Details reviewed HEP and symptom maangement    Person(s) Educated Patient    Methods Explanation;Demonstration;Tactile cues;Verbal cues    Comprehension Verbalized understanding;Returned demonstration;Verbal cues required;Tactile cues required              PT Short Term Goals - 05/26/21 1138       PT SHORT TERM GOAL #1   Title Patient will increase BERG test to 36/56    Baseline 40 on BERG Test 05/26/21    Time 3    Period Weeks    Status Achieved    Target  Date 06/05/21      PT SHORT TERM GOAL #2   Title Patient will increase left LE strength to 5/5    Baseline 5/5 abduction, extension. 4+/5 hip flexion.    Time 3    Period Weeks    Status Partially Met    Target Date 05/15/21      PT SHORT TERM GOAL #3   Title Patient will demonstrate 20 degrees of left shoulder flexion ( may not be potential but we will perfrom light strengthening in the pool)    Baseline 70 degrees    Time 3    Period Weeks    Status Achieved    Target Date 05/15/21      PT SHORT TERM GOAL #4   Title Patient will improve BERG balance test to 45.    Baseline 40    Time 3    Period Weeks    Status New    Target Date 06/16/21      PT SHORT TERM GOAL #5   Title Patient will increase range of motion of left shoulder in flexion to 90 degrees.    Baseline 70    Time 3    Period Weeks    Status New    Target Date 06/16/21               PT Long Term Goals - 05/26/21 1447       PT LONG TERM GOAL #1   Title Patient will be indepdnent with a full pool program they can carryover at the [facility] Patient reports understanding of aquatics HEP during treatment.    Time 6    Period Weeks    Status On-going    Target Date 07/07/21      PT LONG TERM GOAL #2   Title Pt will increase Berg from 44/56 or more for improved balance and decreased fall risk.    Baseline 40    Time 6    Period Weeks    Status On-going    Target Date 07/07/21      PT LONG TERM GOAL #3   Title Patient will ambulate 1000' in the community without loss of balance in order to improve safety    Baseline Patient states he walks 1 mile with improved balance    Time 6    Period Weeks    Status Achieved      PT LONG TERM GOAL #4   Title Pt will increase Lt shoulder active range of motion to 120 degrees to demostrate functional mobility with left  shoulder.    Baseline 70 degrees with discomfort    Time 6    Period Weeks    Status New    Target Date 07/07/21      PT LONG  TERM GOAL #5   Title Patient will have 5/5 strength throughlout lower extremity to ambulate around community safely.    Baseline 4+/5 left hip flexion and 4+/5 left hip abduction    Time 6    Period Weeks    Status New    Target Date 07/07/21                   Plan - 06/03/21 0947     Clinical Impression Statement Patient continues to tolerate water treatment well. Therapy increased his laps with dyanmic balance tasks. Therapy focused more on tandem walking todayin chest deep water. he had no loss of balance. Patient also perfromed step ups. We will continue to advance patient as tolerated.    Personal Factors and Comorbidities Comorbidity 1;Comorbidity 2    Comorbidities CVA, cardiovascular disease, stroke,    Examination-Activity Limitations Squat;Stairs;Reach Overhead    Stability/Clinical Decision Making Evolving/Moderate complexity    Clinical Decision Making Moderate    Rehab Potential Good    PT Frequency 2x / week    PT Duration 8 weeks    PT Treatment/Interventions ADLs/Self Care Home Management;Aquatic Therapy;Electrical Stimulation;Cryotherapy;Moist Heat;Ultrasound;Gait training;Functional mobility training;Therapeutic activities;Therapeutic exercise;Neuromuscular re-education;Patient/family education;Manual techniques;Passive range of motion;Spinal Manipulations    PT Next Visit Plan Incorporate stair training and step ups in the pool. Add land therapy exercises with machines for UE and LE. Continue to improve strength of shoulder flexion and abduction.    PT Home Exercise Plan Access Code: [email or web address]  URL: [email or web address]  Date: 04/27/2021  Prepared by: Denton Meek    Exercises  Backward Walking - 1 x daily - 7 x weekly - 3 sets - 10 reps  Forward March - 1 x daily - 7 x weekly - 3 sets - 10 reps  Side Stepping - 1 x daily - 7 x weekly - 3 sets - 10 reps  Jumping Jacks - 1 x daily - 7 x weekly - 3 sets - 10 reps  Standing Hip Circles at [employer] - 1  x daily - 7 x weekly - 3 sets - 10 reps    Consulted and Agree with Plan of Care Patient             Patient will benefit from skilled therapeutic intervention in order to improve the following deficits and impairments:  Decreased strength, Difficulty walking, Decreased balance, Decreased coordination  Visit Diagnosis: Muscle weakness (generalized)  Hemiplegia and hemiparesis following cerebral infarction affecting left non-dominant side (HCC)  Unsteadiness on feet  Other abnormalities of gait and mobility     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) 10/25/2018   Chronic pain syndrome    Chronic bilateral low back pain    Benign essential HTN    Tobacco abuse    Marijuana abuse    Hyperlipidemia    History of CVA with residual deficit    Dysphagia, post-stroke    ICH (intracerebral hemorrhage) ([place]) - R  thalmic/PLIC d/t HTN 59/30/1237   Insomnia 07/13/2017   PAD (peripheral artery disease) ([place]) 07/13/2017   Hyperlipidemia with target LDL less than 70 07/13/2017   Stroke-like symptom 09/22/2016   Paresthesia 09/22/2016   CVA (cerebral vascular accident) ([place]) 09/22/2016   Cerebrovascular accident (CVA) due to thrombosis of left carotid artery ([place]) 09/23/2015   Primary snoring 09/23/2015   Hypersomnia with sleep apnea 09/23/2015   Obstructive sleep apnea 09/23/2015   Lacunar infarct, acute ([place]) 08/25/2015   Sleep apnea 08/25/2015   Dysarthria    CVD (cardiovascular disease) 08/02/2015   Acute hyperglycemia 99/07/3999   Systolic hypertension with cerebrovascular disease 12/27/2014   Epistaxis 07/28/2012   HYPERGLYCEMIA 10/02/2010   NEPHROLITHIASIS 05/23/2009    BARRETTS ESOPHAGUS 02/20/2009   Gastroparesis 02/20/2009   RADICULOPATHY 10/21/2008   GERD 10/08/2008   ESOPHAGITIS 08/15/2008   ESOPHAGEAL STRICTURE 08/15/2008   GASTRITIS, ACUTE 08/15/2008   RENAL CYST 08/14/2008   TOBACCO ABUSE 08/08/2008   HEMATURIA, MICROSCOPIC, HX OF 08/08/2008   PULMONARY NODULE 01/10/2008   Carolyne Littles PT DPT  06/03/2021   Billey Co SPT 06/03/2021, 11:16 AM  During this treatment session, the therapist was present, participating in and directing the treatment.    [facility] [address] [place], [place], [address] Phone: [phone]   Fax:  [phone]  Name: Richard Vaughn MRN: [id number] Date of Birth: 29-May-1951

## 2021-06-08 ENCOUNTER — Ambulatory Visit: Payer: Medicare Other | Admitting: Family Medicine

## 2021-06-10 ENCOUNTER — Other Ambulatory Visit: Payer: Self-pay

## 2021-06-10 ENCOUNTER — Ambulatory Visit (HOSPITAL_BASED_OUTPATIENT_CLINIC_OR_DEPARTMENT_OTHER): Payer: Medicare Other | Admitting: Physical Therapy

## 2021-06-10 ENCOUNTER — Encounter (HOSPITAL_BASED_OUTPATIENT_CLINIC_OR_DEPARTMENT_OTHER): Payer: Self-pay | Admitting: Physical Therapy

## 2021-06-10 DIAGNOSIS — I69354 Hemiplegia and hemiparesis following cerebral infarction affecting left non-dominant side: Secondary | ICD-10-CM

## 2021-06-10 DIAGNOSIS — R2681 Unsteadiness on feet: Secondary | ICD-10-CM | POA: Diagnosis not present

## 2021-06-10 DIAGNOSIS — R2689 Other abnormalities of gait and mobility: Secondary | ICD-10-CM

## 2021-06-10 DIAGNOSIS — R278 Other lack of coordination: Secondary | ICD-10-CM

## 2021-06-10 DIAGNOSIS — M6281 Muscle weakness (generalized): Secondary | ICD-10-CM

## 2021-06-10 NOTE — Therapy (Signed)
[facility] [address] [place], [place], [address] Phone: [phone]   Fax:  [phone]  Physical Therapy Treatment  Patient Details  Name: Richard Vaughn MRN: [id number] Date of Birth: [birth year] Referring Provider (PT): Dr Betty Martinique   Encounter Date: 06/10/2021   PT End of Session - 06/10/21 1237     Visit Number 14    Number of Visits 18    Date for PT Re-Evaluation 06/23/21    Authorization Type Medicare A and B    PT Start Time 0930    PT Stop Time 1010    PT Time Calculation (min) 40 min    Activity Tolerance Patient tolerated treatment well    Behavior During Therapy [facility] for tasks assessed/performed             Past Medical History:  Diagnosis Date   Barrett's esophagus    Chronic back pain    Depression    Gastritis    Mild   Gastroparesis    GERD (gastroesophageal reflux disease)    Headache    History of kidney stones    Hyperglycemia    Hypertension    Nephrolithiasis    Pneumonia    Covid pneumonia   Renal cyst    Stricture and stenosis of esophagus    Stroke [facility]) 07-2015   Vision abnormalities     Past Surgical History:  Procedure Laterality Date   CERVICAL [place] SURGERY  2012   COLONOSCOPY     LUMBAR [place] SURGERY  2005, 2010   replaced L4 and L5   SHOULDER ARTHROSCOPY WITH ROTATOR CUFF REPAIR Left 08/14/2020   Procedure: SHOULDER ARTHROSCOPY WITH ROTATOR CUFF REPAIR, SUBACROMIAL DECOMPRESSION;  Surgeon: Tania Ade, MD;  Location: [facility];  Service: Orthopedics;  Laterality: Left;   UPPER GASTROINTESTINAL ENDOSCOPY      There were no vitals filed for this visit.   Subjective Assessment - 06/10/21 1236     Subjective Patient continues to have no complaints. He keeps working at home on using his left arm for funtional tasks    Patient is accompained by: Family member    Pertinent History CVA, Lower Back Pain    Limitations Walking    How long can you stand comfortably?  limited time    How long can you walk comfortably? limited community distances    Diagnostic tests nothing pertinant sisnce before his shoulder surgery    Patient Stated Goals to become more mobile and get back to normal as much as able, increase distance he is able to walk, improve ROM of shoulder    Currently in Pain? No/denies    Multiple Pain Sites No               Pt seen for aquatic therapy today.  Treatment took place in water 4-4.5 ft in depth at the [facility]. Temp of water was 91.  Pt entered/exited the pool via stairs (step through pattern) independently with bilat rail.     Warm up: heel/toe walking x4 laps across pool chest deep side stepping x4 laps from shallow to deep.   Exercises; Slow march x20; hip extension x15; hip abduction x15; hip flexion x25. Step ups with each leg x20, Lateral step ups 2x10, reverse step up and step downs 1x5 on each leg.  Seated: Shoulder assisted flexion and abduction (water board) 3x10. Dumbbell water extension 3x10. Water board assisted flexion x20  Balance exercises: marches with each leg in front of  other 3x across pool with yellow noodle. Tandem gait across pool 2x with yellow noodle.  Pt requires buoyancy for support and to offload joints with strengthening exercises. Viscosity of the water is needed for resistance of strengthening; water current perturbations provides challenge to standing balance unsupported, requiring increased core activation        PT Education - 06/10/21 1237     Education Details HEP and symptom mangement    Person(s) Educated Patient    Methods Explanation;Demonstration;Tactile cues;Verbal cues    Comprehension Verbalized understanding;Returned demonstration;Verbal cues required;Tactile cues required              PT Short Term Goals - 05/26/21 1138       PT SHORT TERM GOAL #1   Title Patient will increase BERG test to 36/56    Baseline 40 on BERG Test 05/26/21    Time 3     Period Weeks    Status Achieved    Target Date 06/05/21      PT SHORT TERM GOAL #2   Title Patient will increase left LE strength to 5/5    Baseline 5/5 abduction, extension. 4+/5 hip flexion.    Time 3    Period Weeks    Status Partially Met    Target Date 05/15/21      PT SHORT TERM GOAL #3   Title Patient will demonstrate 20 degrees of left shoulder flexion ( may not be potential but we will perfrom light strengthening in the pool)    Baseline 70 degrees    Time 3    Period Weeks    Status Achieved    Target Date 05/15/21      PT SHORT TERM GOAL #4   Title Patient will improve BERG balance test to 45.    Baseline 40    Time 3    Period Weeks    Status New    Target Date 06/16/21      PT SHORT TERM GOAL #5   Title Patient will increase range of motion of left shoulder in flexion to 90 degrees.    Baseline 70    Time 3    Period Weeks    Status New    Target Date 06/16/21               PT Long Term Goals - 05/26/21 1447       PT LONG TERM GOAL #1   Title Patient will be indepdnent with a full pool program they can carryover at the [facility] Patient reports understanding of aquatics HEP during treatment.    Time 6    Period Weeks    Status On-going    Target Date 07/07/21      PT LONG TERM GOAL #2   Title Pt will increase Berg from 44/56 or more for improved balance and decreased fall risk.    Baseline 40    Time 6    Period Weeks    Status On-going    Target Date 07/07/21      PT LONG TERM GOAL #3   Title Patient will ambulate 1000' in the community without loss of balance in order to improve safety    Baseline Patient states he walks 1 mile with improved balance    Time 6    Period Weeks    Status Achieved      PT LONG TERM GOAL #4   Title Pt will increase Lt shoulder active range of  motion to 120 degrees to demostrate functional mobility with left shoulder.    Baseline 70 degrees with discomfort    Time 6    Period Weeks    Status  New    Target Date 07/07/21      PT LONG TERM GOAL #5   Title Patient will have 5/5 strength throughlout lower extremity to ambulate around community safely.    Baseline 4+/5 left hip flexion and 4+/5 left hip abduction    Time 6    Period Weeks    Status New    Target Date 07/07/21                   Plan - 06/10/21 1239     Clinical Impression Statement Patient continues to tolerate aquatic therapy with no increase in pain. Patient continue to increase balance exercises with more emphasis on dynamic exercises. Patient did report pool was on the warmer side but patient was able to complete treatment session. Patient completed seated shoulder mobility exercises as he stated he wants to continue to improve stregnth of left shoulder when able. Therapy should continue to advanced as tolerated.    Personal Factors and Comorbidities Comorbidity 1;Comorbidity 2    Comorbidities CVA, cardiovascular disease, stroke,    Examination-Activity Limitations Squat;Stairs;Reach Overhead    Examination-Participation Restrictions Community Activity;Volunteer    Stability/Clinical Decision Making Evolving/Moderate complexity    Clinical Decision Making Moderate    Rehab Potential Good    PT Frequency 2x / week    PT Duration 8 weeks    PT Treatment/Interventions ADLs/Self Care Home Management;Aquatic Therapy;Electrical Stimulation;Cryotherapy;Moist Heat;Ultrasound;Gait training;Functional mobility training;Therapeutic activities;Therapeutic exercise;Neuromuscular re-education;Patient/family education;Manual techniques;Passive range of motion;Spinal Manipulations    PT Next Visit Plan Incorporate stair training and step ups in the pool. Add land therapy exercises with machines for UE and LE. Continue to improve strength of shoulder flexion and abduction.    PT Home Exercise Plan Access Code: [email or web address]  URL: [email or web address]  Date: 04/27/2021  Prepared by: Denton Meek    Exercises   Backward Walking - 1 x daily - 7 x weekly - 3 sets - 10 reps  Forward March - 1 x daily - 7 x weekly - 3 sets - 10 reps  Side Stepping - 1 x daily - 7 x weekly - 3 sets - 10 reps  Jumping Jacks - 1 x daily - 7 x weekly - 3 sets - 10 reps  Standing Hip Circles at [employer] - 1 x daily - 7 x weekly - 3 sets - 10 reps    Consulted and Agree with Plan of Care Patient             Patient will benefit from skilled therapeutic intervention in order to improve the following deficits and impairments:  Decreased strength, Difficulty walking, Decreased balance, Decreased coordination  Visit Diagnosis: Muscle weakness (generalized)  Hemiplegia and hemiparesis following cerebral infarction affecting left non-dominant side (HCC)  Unsteadiness on feet  Other abnormalities of gait and mobility  Other lack of coordination     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) 10/25/2018   Chronic pain syndrome    Chronic bilateral  low back pain    Benign essential HTN    Tobacco abuse    Marijuana abuse    Hyperlipidemia    History of CVA with residual deficit    Dysphagia, post-stroke    ICH (intracerebral hemorrhage) (HCC) - R thalmic/PLIC d/t HTN 83/46/2194   Insomnia 07/13/2017   PAD (peripheral artery disease) ([place]) 07/13/2017   Hyperlipidemia with target LDL less than 70 07/13/2017   Stroke-like symptom 09/22/2016   Paresthesia 09/22/2016   CVA (cerebral vascular accident) ([place]) 09/22/2016   Cerebrovascular accident (CVA) due to thrombosis of left carotid artery ([place]) 09/23/2015   Primary snoring 09/23/2015   Hypersomnia with sleep apnea 09/23/2015   Obstructive sleep apnea 09/23/2015    Lacunar infarct, acute ([place]) 08/25/2015   Sleep apnea 08/25/2015   Dysarthria    CVD (cardiovascular disease) 08/02/2015   Acute hyperglycemia 71/25/2712   Systolic hypertension with cerebrovascular disease 12/27/2014   Epistaxis 07/28/2012   HYPERGLYCEMIA 10/02/2010   NEPHROLITHIASIS 05/23/2009   BARRETTS ESOPHAGUS 02/20/2009   Gastroparesis 02/20/2009   RADICULOPATHY 10/21/2008   GERD 10/08/2008   ESOPHAGITIS 08/15/2008   ESOPHAGEAL STRICTURE 08/15/2008   GASTRITIS, ACUTE 08/15/2008   RENAL CYST 08/14/2008   TOBACCO ABUSE 08/08/2008   HEMATURIA, MICROSCOPIC, HX OF 08/08/2008   PULMONARY NODULE 01/10/2008   Carolyne Littles PT DPT  06/10/2021  Billey Co SPT 06/10/2021, 2:13 PM  During this treatment session, the therapist was present, participating in and directing the treatment.   [facility] [address] [place], [place], [address] Phone: [phone]   Fax:  [phone]  Name: Richard Vaughn MRN: [id number] Date of Birth: 1951-03-06

## 2021-06-11 ENCOUNTER — Encounter (HOSPITAL_BASED_OUTPATIENT_CLINIC_OR_DEPARTMENT_OTHER): Payer: Self-pay | Admitting: Physical Therapy

## 2021-06-12 ENCOUNTER — Ambulatory Visit (HOSPITAL_BASED_OUTPATIENT_CLINIC_OR_DEPARTMENT_OTHER): Payer: Medicare Other | Admitting: Physical Therapy

## 2021-06-12 ENCOUNTER — Other Ambulatory Visit: Payer: Self-pay

## 2021-06-12 ENCOUNTER — Encounter (HOSPITAL_BASED_OUTPATIENT_CLINIC_OR_DEPARTMENT_OTHER): Payer: Self-pay | Admitting: Physical Therapy

## 2021-06-12 DIAGNOSIS — R2681 Unsteadiness on feet: Secondary | ICD-10-CM

## 2021-06-12 DIAGNOSIS — R278 Other lack of coordination: Secondary | ICD-10-CM | POA: Diagnosis not present

## 2021-06-12 DIAGNOSIS — M6281 Muscle weakness (generalized): Secondary | ICD-10-CM

## 2021-06-12 DIAGNOSIS — I69354 Hemiplegia and hemiparesis following cerebral infarction affecting left non-dominant side: Secondary | ICD-10-CM | POA: Diagnosis not present

## 2021-06-12 DIAGNOSIS — R2689 Other abnormalities of gait and mobility: Secondary | ICD-10-CM

## 2021-06-12 NOTE — Therapy (Deleted)
[facility] [address] [place], [place], [address] Phone: [phone]   Fax:  [phone]  Physical Therapy Treatment  Patient Details  Name: Richard Vaughn MRN: [id number] Date of Birth: [birth year] Referring Provider (PT): Dr Betty Martinique   Encounter Date: 06/12/2021   PT End of Session - 06/12/21 1021     Visit Number 15    Number of Visits 18    Date for PT Re-Evaluation 06/23/21    Authorization Type Medicare A and B    PT Start Time 0930    PT Stop Time 1013    PT Time Calculation (min) 43 min    Activity Tolerance Patient tolerated treatment well    Behavior During Therapy [facility] for tasks assessed/performed             Past Medical History:  Diagnosis Date   Barrett's esophagus    Chronic back pain    Depression    Gastritis    Mild   Gastroparesis    GERD (gastroesophageal reflux disease)    Headache    History of kidney stones    Hyperglycemia    Hypertension    Nephrolithiasis    Pneumonia    Covid pneumonia   Renal cyst    Stricture and stenosis of esophagus    Stroke [facility]) 07-2015   Vision abnormalities     Past Surgical History:  Procedure Laterality Date   CERVICAL [place] SURGERY  2012   COLONOSCOPY     LUMBAR [place] SURGERY  2005, 2010   replaced L4 and L5   SHOULDER ARTHROSCOPY WITH ROTATOR CUFF REPAIR Left 08/14/2020   Procedure: SHOULDER ARTHROSCOPY WITH ROTATOR CUFF REPAIR, SUBACROMIAL DECOMPRESSION;  Surgeon: Tania Ade, MD;  Location: [facility];  Service: Orthopedics;  Laterality: Left;   UPPER GASTROINTESTINAL ENDOSCOPY      There were no vitals filed for this visit.   Subjective Assessment - 06/12/21 1021     Subjective Patient continues to have no complaints. He is working on his exercises at home    Patient is accompained by: Family member    Pertinent History CVA, Lower Back Pain    How long can you stand comfortably? limited time    How long can you walk comfortably?  limited community distances    Diagnostic tests nothing pertinant sisnce before his shoulder surgery    Patient Stated Goals to become more mobile and get back to normal as much as able, increase distance he is able to walk, improve ROM of shoulder             Pt seen for aquatic therapy today.  Treatment took place in water 4-4.5 ft in depth at the [facility]. Temp of water was 93.  Pt entered/exited the pool via stairs (step through pattern) independently with bilat rail.   Warm up: heel/toe walking x4 laps across pool chest deep side stepping x4 laps from shallow to deep.   Exercises; Slow march x20; hip extension x15; hip abduction x15; hip flexion x25. Step ups with each leg x20, Lateral step ups 2x10   Standing: Dumbbell water extension 3x10. Dumbbell assisted shoulder abduction x30, Bar shoulder extension x20.    Balance exercises: marches with each leg in front of other 3x across pool with blue noodle. Tandem gait across pool 2x with yellow noodle. Box walking with 90 degree turns every 10 feet x5 sets. Side to side walking with pause x3 across pool no noodle.  Pt requires buoyancy for support and to offload joints with strengthening exercises. Viscosity of the water is needed for resistance of strengthening; water current perturbations provides challenge to standing balance unsupported, requiring increased core activation      PT Education - 06/12/21 1211     Education Details reviewed HEP and symptom mangement    Person(s) Educated Patient    Methods Explanation;Demonstration;Tactile cues;Verbal cues    Comprehension Verbalized understanding;Verbal cues required;Tactile cues required;Returned demonstration              PT Short Term Goals - 05/26/21 1138       PT SHORT TERM GOAL #1   Title Patient will increase BERG test to 36/56    Baseline 40 on BERG Test 05/26/21    Time 3    Period Weeks    Status Achieved    Target Date 06/05/21      PT  SHORT TERM GOAL #2   Title Patient will increase left LE strength to 5/5    Baseline 5/5 abduction, extension. 4+/5 hip flexion.    Time 3    Period Weeks    Status Partially Met    Target Date 05/15/21      PT SHORT TERM GOAL #3   Title Patient will demonstrate 20 degrees of left shoulder flexion ( may not be potential but we will perfrom light strengthening in the pool)    Baseline 70 degrees    Time 3    Period Weeks    Status Achieved    Target Date 05/15/21      PT SHORT TERM GOAL #4   Title Patient will improve BERG balance test to 45.    Baseline 40    Time 3    Period Weeks    Status New    Target Date 06/16/21      PT SHORT TERM GOAL #5   Title Patient will increase range of motion of left shoulder in flexion to 90 degrees.    Baseline 70    Time 3    Period Weeks    Status New    Target Date 06/16/21               PT Long Term Goals - 05/26/21 1447       PT LONG TERM GOAL #1   Title Patient will be indepdnent with a full pool program they can carryover at the [facility] Patient reports understanding of aquatics HEP during treatment.    Time 6    Period Weeks    Status On-going    Target Date 07/07/21      PT LONG TERM GOAL #2   Title Pt will increase Berg from 44/56 or more for improved balance and decreased fall risk.    Baseline 40    Time 6    Period Weeks    Status On-going    Target Date 07/07/21      PT LONG TERM GOAL #3   Title Patient will ambulate 1000' in the community without loss of balance in order to improve safety    Baseline Patient states he walks 1 mile with improved balance    Time 6    Period Weeks    Status Achieved      PT LONG TERM GOAL #4   Title Pt will increase Lt shoulder active range of motion to 120 degrees to demostrate functional mobility with left shoulder.    Baseline 70 degrees  with discomfort    Time 6    Period Weeks    Status New    Target Date 07/07/21      PT LONG TERM GOAL #5   Title  Patient will have 5/5 strength throughlout lower extremity to ambulate around community safely.    Baseline 4+/5 left hip flexion and 4+/5 left hip abduction    Time 6    Period Weeks    Status New    Target Date 07/07/21                    Patient will benefit from skilled therapeutic intervention in order to improve the following deficits and impairments:     Visit Diagnosis: Muscle weakness (generalized)  Hemiplegia and hemiparesis following cerebral infarction affecting left non-dominant side (HCC)  Unsteadiness on feet  Other abnormalities of gait and mobility  Other lack of coordination     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) 10/25/2018   Chronic pain syndrome    Chronic bilateral low back pain    Benign essential HTN    Tobacco abuse    Marijuana abuse    Hyperlipidemia    History of CVA with residual deficit    Dysphagia, post-stroke    ICH (intracerebral hemorrhage) ([place]) - R thalmic/PLIC d/t HTN 55/73/2202   Insomnia 07/13/2017   PAD (peripheral artery disease) ([place]) 07/13/2017   Hyperlipidemia with target LDL less than 70 07/13/2017   Stroke-like symptom 09/22/2016   Paresthesia 09/22/2016   CVA (cerebral vascular accident) ([place]) 09/22/2016   Cerebrovascular accident (CVA) due to thrombosis of left carotid artery ([place]) 09/23/2015   Primary snoring 09/23/2015   Hypersomnia with sleep apnea 09/23/2015   Obstructive sleep apnea 09/23/2015   Lacunar infarct, acute ([place]) 08/25/2015   Sleep apnea 08/25/2015   Dysarthria    CVD (cardiovascular disease) 08/02/2015   Acute hyperglycemia 54/27/0623   Systolic hypertension with cerebrovascular disease  12/27/2014   Epistaxis 07/28/2012   HYPERGLYCEMIA 10/02/2010   NEPHROLITHIASIS 05/23/2009   BARRETTS ESOPHAGUS 02/20/2009   Gastroparesis 02/20/2009   RADICULOPATHY 10/21/2008   GERD 10/08/2008   ESOPHAGITIS 08/15/2008   ESOPHAGEAL STRICTURE 08/15/2008   GASTRITIS, ACUTE 08/15/2008   RENAL CYST 08/14/2008   TOBACCO ABUSE 08/08/2008   HEMATURIA, MICROSCOPIC, HX OF 08/08/2008   PULMONARY NODULE 01/10/2008    Billey Co SPT 06/12/2021, 12:49 PM  [facility] [address] [place], [place], [address] Phone: [phone]   Fax:  [phone]  Name: Richard Vaughn MRN: [id number] Date of Birth: August 14, 1951

## 2021-06-12 NOTE — Therapy (Addendum)
[facility] [address] [place], [place], [address] Phone: [phone]   Fax:  [phone]  Physical Therapy Treatment  Patient Details  Name: Richard Vaughn MRN: [id number] Date of Birth: [birth year] Referring Provider (PT): Dr Betty Martinique   Encounter Date: 06/12/2021   PT End of Session - 06/12/21 1021     Visit Number 15    Number of Visits 18    Date for PT Re-Evaluation 06/23/21    Authorization Type Medicare A and B    PT Start Time 0930    PT Stop Time 1013    PT Time Calculation (min) 43 min    Activity Tolerance Patient tolerated treatment well    Behavior During Therapy [facility] for tasks assessed/performed             Past Medical History:  Diagnosis Date   Barrett's esophagus    Chronic back pain    Depression    Gastritis    Mild   Gastroparesis    GERD (gastroesophageal reflux disease)    Headache    History of kidney stones    Hyperglycemia    Hypertension    Nephrolithiasis    Pneumonia    Covid pneumonia   Renal cyst    Stricture and stenosis of esophagus    Stroke [facility]) 07-2015   Vision abnormalities     Past Surgical History:  Procedure Laterality Date   CERVICAL [place] SURGERY  2012   COLONOSCOPY     LUMBAR [place] SURGERY  2005, 2010   replaced L4 and L5   SHOULDER ARTHROSCOPY WITH ROTATOR CUFF REPAIR Left 08/14/2020   Procedure: SHOULDER ARTHROSCOPY WITH ROTATOR CUFF REPAIR, SUBACROMIAL DECOMPRESSION;  Surgeon: Tania Ade, MD;  Location: [facility];  Service: Orthopedics;  Laterality: Left;   UPPER GASTROINTESTINAL ENDOSCOPY      There were no vitals filed for this visit.   Subjective Assessment - 06/12/21 1021     Subjective Patient continues to have no complaints. He is working on his exercises at home    Patient is accompained by: Family member    Pertinent History CVA, Lower Back Pain    How long can you stand comfortably? limited time    How long can you walk comfortably?  limited community distances    Diagnostic tests nothing pertinant sisnce before his shoulder surgery    Patient Stated Goals to become more mobile and get back to normal as much as able, increase distance he is able to walk, improve ROM of shoulder               Pt seen for aquatic therapy today.  Treatment took place in water 4-4.5 ft in depth at the [facility]. Temp of water was 93.  Pt entered/exited the pool via stairs (step through pattern) independently with bilat rail.   Warm up: heel/toe walking x4 laps across pool chest deep side stepping x4 laps from shallow to deep.   Exercises; Slow march x20; hip extension x15; hip abduction x15; hip flexion x25. Step ups with each leg x20, Lateral step ups 2x10   Standing: Dumbbell water extension 3x10. Dumbbell assisted shoulder abduction x30, Bar shoulder extension x20.    Balance exercises: marches with each leg in front of other 3x across pool with blue noodle. Tandem gait across pool 2x with yellow noodle. Box walking with 90 degree turns every 10 feet x5 sets. Side to side walking with pause x3 across pool no noodle.  Pt requires buoyancy for support and to offload joints with strengthening exercises. Viscosity of the water is needed for resistance of strengthening; water current perturbations provides challenge to standing balance unsupported, requiring increased core activation     PT Education - 06/12/21 1211     Education Details reviewed HEP and symptom mangement    Person(s) Educated Patient    Methods Explanation;Demonstration;Tactile cues;Verbal cues    Comprehension Verbalized understanding;Verbal cues required;Tactile cues required;Returned demonstration              PT Short Term Goals - 05/26/21 1138       PT SHORT TERM GOAL #1   Title Patient will increase BERG test to 36/56    Baseline 40 on BERG Test 05/26/21    Time 3    Period Weeks    Status Achieved    Target Date 06/05/21       PT SHORT TERM GOAL #2   Title Patient will increase left LE strength to 5/5    Baseline 5/5 abduction, extension. 4+/5 hip flexion.    Time 3    Period Weeks    Status Partially Met    Target Date 05/15/21      PT SHORT TERM GOAL #3   Title Patient will demonstrate 20 degrees of left shoulder flexion ( may not be potential but we will perfrom light strengthening in the pool)    Baseline 70 degrees    Time 3    Period Weeks    Status Achieved    Target Date 05/15/21      PT SHORT TERM GOAL #4   Title Patient will improve BERG balance test to 45.    Baseline 40    Time 3    Period Weeks    Status New    Target Date 06/16/21      PT SHORT TERM GOAL #5   Title Patient will increase range of motion of left shoulder in flexion to 90 degrees.    Baseline 70    Time 3    Period Weeks    Status New    Target Date 06/16/21               PT Long Term Goals - 05/26/21 1447       PT LONG TERM GOAL #1   Title Patient will be indepdnent with a full pool program they can carryover at the [facility] Patient reports understanding of aquatics HEP during treatment.    Time 6    Period Weeks    Status On-going    Target Date 07/07/21      PT LONG TERM GOAL #2   Title Pt will increase Berg from 44/56 or more for improved balance and decreased fall risk.    Baseline 40    Time 6    Period Weeks    Status On-going    Target Date 07/07/21      PT LONG TERM GOAL #3   Title Patient will ambulate 1000' in the community without loss of balance in order to improve safety    Baseline Patient states he walks 1 mile with improved balance    Time 6    Period Weeks    Status Achieved      PT LONG TERM GOAL #4   Title Pt will increase Lt shoulder active range of motion to 120 degrees to demostrate functional mobility with left shoulder.    Baseline 70 degrees with  discomfort    Time 6    Period Weeks    Status New    Target Date 07/07/21      PT LONG TERM GOAL #5   Title  Patient will have 5/5 strength throughlout lower extremity to ambulate around community safely.    Baseline 4+/5 left hip flexion and 4+/5 left hip abduction    Time 6    Period Weeks    Status New    Target Date 07/07/21                   Plan - 06/12/21 1255     Clinical Impression Statement Patient continues to tolerate aquatic therapy with no increase in pain or discomfort, Patient is continueing to improve balance with dynamic exercises with blue noodle when needed and perform shoulder mobility exercises. Patient should continue therapy as tolerated in order to continue to strengthen lower and upper extremity, with an emphasis on balance and mobilty.    Personal Factors and Comorbidities Comorbidity 1;Comorbidity 2    Comorbidities CVA, cardiovascular disease, stroke,    Examination-Activity Limitations Squat;Stairs;Reach Overhead    Examination-Participation Restrictions Community Activity;Volunteer    Stability/Clinical Decision Making Evolving/Moderate complexity    Clinical Decision Making Moderate    Rehab Potential Good    PT Frequency 2x / week    PT Duration 8 weeks    PT Treatment/Interventions ADLs/Self Care Home Management;Aquatic Therapy;Electrical Stimulation;Cryotherapy;Moist Heat;Ultrasound;Gait training;Functional mobility training;Therapeutic activities;Therapeutic exercise;Neuromuscular re-education;Patient/family education;Manual techniques;Passive range of motion;Spinal Manipulations    PT Next Visit Plan Incorporate stair training and step ups in the pool. Add land therapy exercises with machines for UE and LE. Continue to improve strength of shoulder flexion and abduction.    PT Home Exercise Plan Access Code: [email or web address]  URL: [email or web address]  Date: 04/27/2021  Prepared by: Denton Meek    Exercises  Backward Walking - 1 x daily - 7 x weekly - 3 sets - 10 reps  Forward March - 1 x daily - 7 x weekly - 3 sets - 10 reps  Side Stepping - 1 x  daily - 7 x weekly - 3 sets - 10 reps  Jumping Jacks - 1 x daily - 7 x weekly - 3 sets - 10 reps  Standing Hip Circles at [employer] - 1 x daily - 7 x weekly - 3 sets - 10 reps    Consulted and Agree with Plan of Care Patient             Patient will benefit from skilled therapeutic intervention in order to improve the following deficits and impairments:  Decreased strength, Difficulty walking, Decreased balance, Decreased coordination  Visit Diagnosis: Muscle weakness (generalized)  Hemiplegia and hemiparesis following cerebral infarction affecting left non-dominant side (HCC)  Unsteadiness on feet  Other abnormalities of gait and mobility  Other lack of coordination     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) 10/25/2018   Chronic pain syndrome    Chronic bilateral low back pain    Benign essential HTN    Tobacco abuse    Marijuana abuse    Hyperlipidemia    History of CVA with residual  deficit    Dysphagia, post-stroke    ICH (intracerebral hemorrhage) (HCC) - R thalmic/PLIC d/t HTN 11/17/7251   Insomnia 07/13/2017   PAD (peripheral artery disease) ([place]) 07/13/2017   Hyperlipidemia with target LDL less than 70 07/13/2017   Stroke-like symptom 09/22/2016   Paresthesia 09/22/2016   CVA (cerebral vascular accident) ([place]) 09/22/2016   Cerebrovascular accident (CVA) due to thrombosis of left carotid artery ([place]) 09/23/2015   Primary snoring 09/23/2015   Hypersomnia with sleep apnea 09/23/2015   Obstructive sleep apnea 09/23/2015   Lacunar infarct, acute ([place]) 08/25/2015   Sleep apnea 08/25/2015   Dysarthria    CVD (cardiovascular disease) 08/02/2015   Acute hyperglycemia  66/44/0347   Systolic hypertension with cerebrovascular disease 12/27/2014   Epistaxis 07/28/2012   HYPERGLYCEMIA 10/02/2010   NEPHROLITHIASIS 05/23/2009   BARRETTS ESOPHAGUS 02/20/2009   Gastroparesis 02/20/2009   RADICULOPATHY 10/21/2008   GERD 10/08/2008   ESOPHAGITIS 08/15/2008   ESOPHAGEAL STRICTURE 08/15/2008   GASTRITIS, ACUTE 08/15/2008   RENAL CYST 08/14/2008   TOBACCO ABUSE 08/08/2008   HEMATURIA, MICROSCOPIC, HX OF 08/08/2008   PULMONARY NODULE 01/10/2008   Carolyne Littles PT DPT  06/12/2021   Billey Co SPT 06/12/2021, 12:58 PM  During this treatment session, the therapist was present, participating in and directing the treatment.   [facility] [address] [place], [place], [address] Phone: [phone]   Fax:  [phone]  Name: RAMA MCCLINTOCK MRN: [id number] Date of Birth: [birth year]

## 2021-06-16 ENCOUNTER — Ambulatory Visit (HOSPITAL_BASED_OUTPATIENT_CLINIC_OR_DEPARTMENT_OTHER): Payer: Medicare Other | Admitting: Physical Therapy

## 2021-06-16 ENCOUNTER — Ambulatory Visit: Payer: Medicare Other | Admitting: Psychology

## 2021-06-19 ENCOUNTER — Ambulatory Visit (HOSPITAL_BASED_OUTPATIENT_CLINIC_OR_DEPARTMENT_OTHER): Payer: Medicare Other | Attending: Family Medicine | Admitting: Physical Therapy

## 2021-06-19 ENCOUNTER — Encounter (HOSPITAL_BASED_OUTPATIENT_CLINIC_OR_DEPARTMENT_OTHER): Payer: Self-pay | Admitting: Physical Therapy

## 2021-06-19 ENCOUNTER — Other Ambulatory Visit: Payer: Self-pay

## 2021-06-19 DIAGNOSIS — I69354 Hemiplegia and hemiparesis following cerebral infarction affecting left non-dominant side: Secondary | ICD-10-CM

## 2021-06-19 DIAGNOSIS — R2689 Other abnormalities of gait and mobility: Secondary | ICD-10-CM | POA: Diagnosis not present

## 2021-06-19 DIAGNOSIS — M6281 Muscle weakness (generalized): Secondary | ICD-10-CM

## 2021-06-19 DIAGNOSIS — R278 Other lack of coordination: Secondary | ICD-10-CM

## 2021-06-19 DIAGNOSIS — R2681 Unsteadiness on feet: Secondary | ICD-10-CM | POA: Diagnosis not present

## 2021-06-19 NOTE — Therapy (Signed)
[facility] [address] [place], [place], [address] Phone: [phone]   Fax:  [phone]  Physical Therapy Treatment/Discharge   Patient Details  Name: Richard Vaughn MRN: [id number] Date of Birth: 07-18-1951 Referring Provider (PT): Dr Betty Martinique   Encounter Date: 06/19/2021   PT End of Session - 06/19/21 1039     Visit Number 16    Number of Visits 18    Date for PT Re-Evaluation 06/23/21    Authorization Type Medicare A and B    PT Start Time 0933    PT Stop Time 3149    PT Time Calculation (min) 42 min    Equipment Utilized During Treatment Other (comment)   water equipment   Activity Tolerance Patient tolerated treatment well    Behavior During Therapy [facility] for tasks assessed/performed             Past Medical History:  Diagnosis Date   Barrett's esophagus    Chronic back pain    Depression    Gastritis    Mild   Gastroparesis    GERD (gastroesophageal reflux disease)    Headache    History of kidney stones    Hyperglycemia    Hypertension    Nephrolithiasis    Pneumonia    Covid pneumonia   Renal cyst    Stricture and stenosis of esophagus    Stroke [facility]) 07-2015   Vision abnormalities     Past Surgical History:  Procedure Laterality Date   CERVICAL [place] SURGERY  2012   COLONOSCOPY     LUMBAR [place] SURGERY  2005, 2010   replaced L4 and L5   SHOULDER ARTHROSCOPY WITH ROTATOR CUFF REPAIR Left 08/14/2020   Procedure: SHOULDER ARTHROSCOPY WITH ROTATOR CUFF REPAIR, SUBACROMIAL DECOMPRESSION;  Surgeon: Tania Ade, MD;  Location: [facility];  Service: Orthopedics;  Laterality: Left;   UPPER GASTROINTESTINAL ENDOSCOPY      There were no vitals filed for this visit.   Subjective Assessment - 06/19/21 1037     Subjective Patients reports feeling well at time of visit with no complaints of pain or discomfort.    Pertinent History CVA, Lower Back Pain    Limitations Walking    How long can you  stand comfortably? no issues standing    How long can you walk comfortably? 1 miles walks    Diagnostic tests nothing pertinant sisnce before his shoulder surgery    Patient Stated Goals to become more mobile and get back to normal as much as able, increase distance he is able to walk, improve ROM of shoulder    Currently in Pain? No/denies    Multiple Pain Sites No                [facility] PT Assessment - 06/19/21 0001       Strength   Right Hip Flexion 5/5    Right Hip ABduction 5/5    Right Hip ADduction 5/5    Left Hip Flexion 5/5    Left Hip ABduction 5/5    Left Hip ADduction 5/5      Ambulation/Gait   Gait Comments continues to use his cane. Overall improved hip flexion and improved stride length with gait.      Berg Balance Test   Sit to Stand Able to stand  independently using hands    Standing Unsupported Able to stand safely 2 minutes    Sitting with Back Unsupported but Feet Supported on Floor or Stool  Able to sit safely and securely 2 minutes    Stand to Sit Controls descent by using hands    Transfers Able to transfer safely, definite need of hands    Standing Unsupported with Eyes Closed Able to stand 10 seconds safely    Standing Unsupported with Feet Together Able to place feet together independently and stand 1 minute safely    From Standing, Reach Forward with Outstretched Arm Can reach forward >12 cm safely (5")    From Standing Position, Pick up Object from Floor Able to pick up shoe, needs supervision    From Standing Position, Turn to Look Behind Over each Shoulder Looks behind from both sides and weight shifts well    Turn 360 Degrees Able to turn 360 degrees safely but slowly    Standing Unsupported, Alternately Place Feet on Step/Stool Needs assistance to keep from falling or unable to try    Standing Unsupported, One Foot in Front Able to take small step independently and hold 30 seconds    Standing on One Leg Tries to lift leg/unable to hold 3 seconds  but remains standing independently    Total Score 40                Pt seen for aquatic therapy today.  Treatment took place in water 4-4.5 ft in depth at the [place]. Temp of water was 93.  Pt entered/exited the pool via stairs (step through pattern) independently with bilat rail.   Warm up: heel/toe walking x4 laps across pool chest deep side stepping x4 laps from shallow to deep.   Exercises; Slow march x20; hip extension x15; hip abduction x15; hip flexion x25. Step ups with each leg x20, Lateral step ups 2x10   Standing: Dumbbell water extension 3x10. Dumbbell assisted shoulder abduction x30, Bar shoulder extension x20.   Balance exercises: marches with each leg in front of other 3x across pool with blue noodle. Tandem gait across pool 2x with yellow noodle. Box walking with 90 degree turns every 10 feet x5 sets. Side to side walking with pause x3 across pool no noodle.   Pt requires buoyancy for support and to offload joints with strengthening exercises. Viscosity of the water is needed for resistance of strengthening; water current perturbations provides challenge to standing balance unsupported, requiring increased core activation                   PT Education - 06/19/21 1038     Education Details Reviewed aquatic exercises plan so patient could complete while in the pool    Person(s) Educated Patient    Methods Explanation;Demonstration;Verbal cues    Comprehension Verbalized understanding;Returned demonstration;Verbal cues required              PT Short Term Goals - 06/19/21 1048       PT SHORT TERM GOAL #1   Title Patient will increase BERG test to 36/56    Baseline 40 on BERG Test 05/26/21    Time 3    Period Weeks    Status Achieved    Target Date 06/05/21      PT SHORT TERM GOAL #2   Title Patient will increase left LE strength to 5/5    Baseline 5/5 abduction, extension. 5/5 hip flexion.    Time 3    Period Weeks     Status Achieved    Target Date 05/15/21      PT SHORT TERM GOAL #3   Title  Patient will demonstrate 20 degrees of left shoulder flexion ( may not be potential but we will perfrom light strengthening in the pool)    Baseline 70 degrees    Time 3    Period Weeks    Status Achieved    Target Date 05/15/21      PT SHORT TERM GOAL #4   Title Patient will improve BERG balance test to 45.    Baseline 40    Time 3    Period Weeks    Status New    Target Date 06/16/21      PT SHORT TERM GOAL #5   Title Patient will increase range of motion of left shoulder in flexion to 90 degrees.    Baseline 70    Time 3    Period Weeks    Status New    Target Date 06/16/21               PT Long Term Goals - 06/19/21 1049       PT LONG TERM GOAL #1   Title Patient will be indepdnent with a full pool program they can carryover at the [place] Patient reports understanding of aquatics HEP during treatment.    Time 6    Period Weeks    Status Achieved    Target Date 06/19/21      PT LONG TERM GOAL #2   Title Pt will increase Berg from 44/56 or more for improved balance and decreased fall risk.    Baseline 40    Time 6    Period Weeks    Status Partially Met      PT LONG TERM GOAL #3   Title Patient will ambulate 1000' in the community without loss of balance in order to improve safety    Baseline Patient states he walks 1 mile with improved balance    Time 6    Period Weeks    Status Achieved      PT LONG TERM GOAL #4   Title Pt will increase Lt shoulder active range of motion to 120 degrees to demostrate functional mobility with left shoulder.    Baseline 70 degrees with discomfort    Time 6    Period Weeks    Status Partially Met      PT LONG TERM GOAL #5   Title Patient will have 5/5 strength throughlout lower extremity to ambulate around community safely.    Baseline 4+/5 left hip flexion and 4+/5 left hip abduction    Time 6    Period Weeks    Status  Achieved                   Plan - 06/19/21 1043     Clinical Impression Statement Patient tolerated treatment well with no increase in pain or discomfort. Patient has understanding of aquatic and land home exercise plan. Patients strenght in lower extremity has improved to 5/5 through as has met his functional goal in order to ambulate more safely. Patients berg is still at 40/56 which is a major improvement compared to his inital visit that was 30/56. Patient has active movement of his left UE, which he didn't upon initial evalaution. Patient understands that he must continue to work on strength and balance in upper and lower extremity even when not in therapy. Patient was encouraged to complete age related activites with safety in mind. Overall, the patient has benefited from skilled therapy to improve strength,  balance, and endurace.    Personal Factors and Comorbidities Comorbidity 1;Comorbidity 2    Comorbidities CVA, cardiovascular disease, stroke,    Examination-Activity Limitations Squat;Stairs;Reach Overhead    Examination-Participation Restrictions Community Activity;Volunteer    Stability/Clinical Decision Making Evolving/Moderate complexity    Clinical Decision Making Moderate    Rehab Potential Good    PT Frequency 2x / week    PT Duration 8 weeks    PT Treatment/Interventions ADLs/Self Care Home Management;Aquatic Therapy;Electrical Stimulation;Cryotherapy;Moist Heat;Ultrasound;Gait training;Functional mobility training;Therapeutic activities;Therapeutic exercise;Neuromuscular re-education;Patient/family education;Manual techniques;Passive range of motion;Spinal Manipulations    PT Next Visit Plan Incorporate stair training and step ups in the pool. Add land therapy exercises with machines for UE and LE. Continue to improve strength of shoulder flexion and abduction.    PT Home Exercise Plan Access Code: [email or web address]  URL: [email or web address]  Date: 04/27/2021   Prepared by: Denton Meek    Exercises  Backward Walking - 1 x daily - 7 x weekly - 3 sets - 10 reps  Forward March - 1 x daily - 7 x weekly - 3 sets - 10 reps  Side Stepping - 1 x daily - 7 x weekly - 3 sets - 10 reps  Jumping Jacks - 1 x daily - 7 x weekly - 3 sets - 10 reps  Standing Hip Circles at [employer] - 1 x daily - 7 x weekly - 3 sets - 10 reps    Consulted and Agree with Plan of Care Patient             Patient will benefit from skilled therapeutic intervention in order to improve the following deficits and impairments:  Decreased strength, Difficulty walking, Decreased balance, Decreased coordination  Visit Diagnosis: Muscle weakness (generalized)  Hemiplegia and hemiparesis following cerebral infarction affecting left non-dominant side (HCC)  Unsteadiness on feet  Other abnormalities of gait and mobility  Other lack of coordination  PHYSICAL THERAPY DISCHARGE SUMMARY  Visits from Start of Care: 16  Current functional level related to goals / functional outcomes: Significant improvement in balance and use of the left shoulder   Remaining deficits: Att itmes does not lift his let leg high enough walking; limited use of the left arm    Education / Equipment: HEP  Patient agrees to discharge. Patient goals were met . Patient is being discharged due to meeting the stated rehab goals.    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) 10/25/2018   Chronic pain syndrome    Chronic bilateral low back pain    Benign essential HTN    Tobacco abuse    Marijuana abuse    Hyperlipidemia    History of CVA with residual deficit    Dysphagia, post-stroke    ICH (intracerebral  hemorrhage) ([place]) - R thalmic/PLIC d/t HTN 35/00/9381   Insomnia 07/13/2017   PAD (peripheral artery disease) ([place]) 07/13/2017   Hyperlipidemia with target LDL less than 70 07/13/2017   Stroke-like symptom 09/22/2016   Paresthesia 09/22/2016   CVA (cerebral vascular accident) ([place]) 09/22/2016   Cerebrovascular accident (CVA) due to thrombosis of left carotid artery ([place]) 09/23/2015   Primary snoring 09/23/2015   Hypersomnia with sleep apnea  09/23/2015   Obstructive sleep apnea 09/23/2015   Lacunar infarct, acute ([place]) 08/25/2015   Sleep apnea 08/25/2015   Dysarthria    CVD (cardiovascular disease) 08/02/2015   Acute hyperglycemia 58/44/1712   Systolic hypertension with cerebrovascular disease 12/27/2014   Epistaxis 07/28/2012   HYPERGLYCEMIA 10/02/2010   NEPHROLITHIASIS 05/23/2009   BARRETTS ESOPHAGUS 02/20/2009   Gastroparesis 02/20/2009   RADICULOPATHY 10/21/2008   GERD 10/08/2008   ESOPHAGITIS 08/15/2008   ESOPHAGEAL STRICTURE 08/15/2008   GASTRITIS, ACUTE 08/15/2008   RENAL CYST 08/14/2008   TOBACCO ABUSE 08/08/2008   HEMATURIA, MICROSCOPIC, HX OF 08/08/2008   PULMONARY NODULE 01/10/2008    Carney Living PT DPT  06/19/2021, 1:23 PM  Davis Gourd 06/19/2021   During this treatment session, the therapist was present, participating in and directing the treatment.    [facility] [address] [place], [place], [address] Phone: [phone]   Fax:  [phone]  Name: Richard Vaughn MRN: [id number] Date of Birth: 02-Aug-1951

## 2021-06-25 ENCOUNTER — Encounter: Payer: Self-pay | Admitting: Family Medicine

## 2021-07-06 ENCOUNTER — Ambulatory Visit (HOSPITAL_COMMUNITY): Payer: Medicare Other

## 2021-07-06 ENCOUNTER — Other Ambulatory Visit: Payer: Self-pay

## 2021-07-06 ENCOUNTER — Encounter (HOSPITAL_COMMUNITY): Payer: Self-pay | Admitting: Emergency Medicine

## 2021-07-06 ENCOUNTER — Ambulatory Visit (HOSPITAL_COMMUNITY)
Admission: EM | Admit: 2021-07-06 | Discharge: 2021-07-06 | Disposition: A | Discharge status: Home / Self Care | Payer: Medicare Other | Attending: Family Medicine | Admitting: Family Medicine

## 2021-07-06 DIAGNOSIS — J069 Acute upper respiratory infection, unspecified: Secondary | ICD-10-CM | POA: Diagnosis not present

## 2021-07-06 DIAGNOSIS — R059 Cough, unspecified: Secondary | ICD-10-CM

## 2021-07-06 DIAGNOSIS — R062 Wheezing: Secondary | ICD-10-CM

## 2021-07-06 MED ORDER — PROMETHAZINE-DM 6.25-15 MG/5ML PO SYRP: 5.0000 mL | ORAL_SOLUTION | Freq: Four times a day (QID) | ORAL | 0 refills | Status: DC | PRN

## 2021-07-06 MED ORDER — AZITHROMYCIN 250 MG PO TABS: ORAL_TABLET | ORAL | 0 refills | Status: DC

## 2021-07-06 MED ORDER — PREDNISONE 20 MG PO TABS: 40.0000 mg | ORAL_TABLET | Freq: Every day | ORAL | 0 refills | Status: DC

## 2021-07-06 NOTE — ED Provider Notes (Signed)
[facility]    CSN: [id number] Arrival date & time: 07/06/21  1302      History   Chief Complaint Chief Complaint  Patient presents with   Cough    HPI Richard Vaughn is a 70 y.o. male.   Presenting today with 1 week history of worsening productive cough, congestion, fever, weakness.  Denies significant chest pain, shortness of breath, abdominal pain, nausea vomiting diarrhea.  Denies any known sick contacts.  Has not taken a COVID test since onset of symptoms.  Taking over-the-counter cold and congestion medications with minimal relief.  Past medical history significant for history of CVA, COVID-pneumonia, hypertension.    Past Medical History:  Diagnosis Date   Barrett's esophagus    Chronic back pain    Depression    Gastritis    Mild   Gastroparesis    GERD (gastroesophageal reflux disease)    Headache    History of kidney stones    Hyperglycemia    Hypertension    Nephrolithiasis    Pneumonia    Covid pneumonia   Renal cyst    Stricture and stenosis of esophagus    Stroke [facility]) 07-2015   Vision abnormalities     Patient Active Problem List   Diagnosis Date Noted   Depressive reaction    Sleep disturbance    Urinary retention    Bradycardia    Essential hypertension    Temperature elevated    Left thyroid nodule    Treatment-emergent central sleep apnea 10/25/2018   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) 10/25/2018   Chronic pain syndrome    Chronic bilateral low back pain    Benign essential HTN    Tobacco abuse    Marijuana abuse    Hyperlipidemia    History of CVA with residual deficit    Dysphagia, post-stroke    ICH (intracerebral hemorrhage) ([place]) - R thalmic/PLIC d/t HTN [id number]   Insomnia 07/13/2017   PAD (peripheral artery disease) ([place]) 07/13/2017   Hyperlipidemia with target LDL less than 70 07/13/2017   Stroke-like symptom 09/22/2016   Paresthesia 09/22/2016   CVA (cerebral vascular accident)  ([place]) 09/22/2016   Cerebrovascular accident (CVA) due to thrombosis of left carotid artery ([place]) 09/23/2015   Primary snoring 09/23/2015   Hypersomnia with sleep apnea 09/23/2015   Obstructive sleep apnea 09/23/2015   Lacunar infarct, acute ([place]) 08/25/2015   Sleep apnea 08/25/2015   Dysarthria    CVD (cardiovascular disease) 08/02/2015   Acute hyperglycemia 0000000   Systolic hypertension with cerebrovascular disease 12/27/2014   Epistaxis 07/28/2012   HYPERGLYCEMIA 10/02/2010   NEPHROLITHIASIS 05/23/2009   BARRETTS ESOPHAGUS 02/20/2009   Gastroparesis 02/20/2009   RADICULOPATHY 10/21/2008   GERD 10/08/2008   ESOPHAGITIS 08/15/2008   ESOPHAGEAL STRICTURE 08/15/2008   GASTRITIS, ACUTE 08/15/2008   RENAL CYST 08/14/2008   TOBACCO ABUSE 08/08/2008   HEMATURIA, MICROSCOPIC, HX OF 08/08/2008   PULMONARY NODULE 01/10/2008    Past Surgical History:  Procedure Laterality Date   CERVICAL [place] SURGERY  2012   COLONOSCOPY     LUMBAR [place] SURGERY  2005, 2010   replaced L4 and L5   SHOULDER ARTHROSCOPY WITH ROTATOR CUFF REPAIR Left 08/14/2020   Procedure: SHOULDER ARTHROSCOPY WITH ROTATOR CUFF REPAIR, SUBACROMIAL DECOMPRESSION;  Surgeon: Tania Ade, MD;  Location: [facility];  Service: Orthopedics;  Laterality: Left;   UPPER GASTROINTESTINAL ENDOSCOPY         Home Medications    Prior to Admission medications  Medication Sig Start Date End Date Taking? Authorizing Provider  azithromycin (ZITHROMAX) 250 MG tablet As directed 07/06/21  Yes Volney American, PA-C  predniSONE (DELTASONE) 20 MG tablet Take 2 tablets (40 mg total) by mouth daily with breakfast. 07/06/21  Yes Volney American, PA-C  promethazine-dextromethorphan (PROMETHAZINE-DM) 6.25-15 MG/5ML syrup Take 5 mLs by mouth 4 (four) times daily as needed for cough. 07/06/21  Yes Volney American, PA-C  amLODipine (NORVASC) 2.5 MG tablet Take 1 tablet (2.5 mg total) by mouth daily. 08/25/20   Martinique, Betty  G, MD  aspirin EC 81 MG tablet Take 1 tablet (81 mg total) by mouth daily. Swallow whole. 06/10/20   Frann Rider, NP  DULoxetine (CYMBALTA) 60 MG capsule Take 1 capsule (60 mg total) by mouth daily. 05/05/21   Martinique, [name], MD  esomeprazole (NEXIUM) 40 MG capsule TAKE 1 CAPSULE (40 MG TOTAL) BY MOUTH 2 (TWO) TIMES DAILY BEFORE A MEAL. 01/05/21   Armbruster, Carlota Raspberry, MD  lovastatin (MEVACOR) 20 MG tablet Take 1 tablet (20 mg total) by mouth at bedtime. 04/07/21   Martinique, [name], MD  olmesartan (BENICAR) 20 MG tablet Take 1 tablet (20 mg total) by mouth daily. 08/25/20   Martinique, [name], MD  oxyCODONE-acetaminophen (PERCOCET) 5-325 MG tablet Take 1-2 tablets every 4 hours as needed for post operative pain. MAX 6/day 08/14/20   Grier Mitts, PA-C  oxymorphone (OPANA) 10 MG tablet Take 10 mg by mouth in the morning and at bedtime.    [provider]  sucralfate (CARAFATE) 1 GM/10ML suspension Take 10 mLs (1 g total) by mouth every 6 (six) hours as needed. 07/04/20   Armbruster, Carlota Raspberry, MD  tiZANidine (ZANAFLEX) 4 MG tablet Take 1 tablet (4 mg total) by mouth every 8 (eight) hours as needed for muscle spasms. 08/14/20   Grier Mitts, PA-C  traZODone (DESYREL) 50 MG tablet Take 0.5 tablets (25 mg total) by mouth at bedtime as needed for sleep. 05/05/21   Martinique, [name], MD    Family History Family History  Problem Relation Age of Onset   Hypertension Father    Stroke Father    Hypertension Mother    Liver cancer Mother    Hypertension Sister        2 other sisters has also   Stroke Sister    Stroke Sister    Hypertension Brother        2nd brother has also   Colon cancer Neg Hx    Esophageal cancer Neg Hx    Rectal cancer Neg Hx    Stomach cancer Neg Hx    Colon polyps Neg Hx     Social History Social History   Tobacco Use   Smoking status: Former    Packs/day: 0.50    Years: 30.00    Pack years: 15.00    Types: Cigarettes    Quit date: 04/15/2018    Years  since quitting: 3.2   Smokeless tobacco: Never  Vaping Use   Vaping Use: Never used  Substance Use Topics   Alcohol use: No    Alcohol/week: 0.0 standard drinks   Drug use: Never    Comment: last use 04/2018     Allergies   Patient has no known allergies.   Review of Systems Review of Systems Per HPI  Physical Exam Triage Vital Signs ED Triage Vitals  Enc Vitals Group     BP 07/06/21 1417 104/67     Pulse Rate 07/06/21  1417 72     Resp 07/06/21 1417 20     Temp 07/06/21 1417 98.2 F (36.8 C)     Temp Source 07/06/21 1417 Oral     SpO2 07/06/21 1417 98 %     Weight --      Height --      Head Circumference --      Peak Flow --      Pain Score 07/06/21 1413 4     Pain Loc --      Pain Edu? --      Excl. in [place]? --    No data found.  Updated Vital Signs BP 104/67 (BP Location: Right Arm) Comment (BP Location): large cuff  Pulse 72   Temp 98.2 F (36.8 C) (Oral)   Resp 20   SpO2 98%   Visual Acuity Right Eye Distance:   Left Eye Distance:   Bilateral Distance:    Right Eye Near:   Left Eye Near:    Bilateral Near:     Physical Exam Vitals and nursing note reviewed.  Constitutional:      Appearance: Normal appearance.  HENT:     Head: Atraumatic.     Right Ear: Tympanic membrane normal.     Left Ear: Tympanic membrane normal.     Nose: Congestion present.     Mouth/Throat:     Mouth: Mucous membranes are moist.     Pharynx: Posterior oropharyngeal erythema present.  Eyes:     Extraocular Movements: Extraocular movements intact.     Conjunctiva/sclera: Conjunctivae normal.  Cardiovascular:     Rate and Rhythm: Normal rate and regular rhythm.     Heart sounds: Normal heart sounds.  Pulmonary:     Effort: Pulmonary effort is normal. No respiratory distress.     Breath sounds: Wheezing present. No rhonchi or rales.  Musculoskeletal:        General: Normal range of motion.     Cervical back: Normal range of motion and neck supple.  Skin:     General: Skin is warm and dry.  Neurological:     Mental Status: He is oriented to person, place, and time. Mental status is at baseline.  Psychiatric:        Mood and Affect: Mood normal.        Thought Content: Thought content normal.        Judgment: Judgment normal.   UC Treatments / Results  Labs (all labs ordered are listed, but only abnormal results are displayed) Labs Reviewed - No data to display  EKG   Radiology No results found.  Procedures Procedures (including critical care time)  Medications Ordered in UC Medications - No data to display  Initial Impression / Assessment and Plan / UC Course  I have reviewed the triage vital signs and the nursing notes.  Pertinent labs & imaging results that were available during my care of the patient were reviewed by me and considered in my medical decision making (see chart for details).     Overall vital signs reassuring, exam with moderate wheezes bilaterally and worsening symptoms.  Will cover for developing pneumonia with azithromycin and send prednisone, Phenergan DM in addition to taking Mucinex and other supportive medications over-the-counter.  Supportive home care reviewed.  Return for acutely worsening symptoms.  Final Clinical Impressions(s) / UC Diagnoses   Final diagnoses:  Cough  Upper respiratory tract infection, unspecified type   Discharge Instructions   None    ED Prescriptions  Medication Sig Dispense Auth. Provider   azithromycin (ZITHROMAX) 250 MG tablet As directed 6 tablet Volney American, PA-C   predniSONE (DELTASONE) 20 MG tablet Take 2 tablets (40 mg total) by mouth daily with breakfast. 10 tablet Volney American, PA-C   promethazine-dextromethorphan (PROMETHAZINE-DM) 6.25-15 MG/5ML syrup Take 5 mLs by mouth 4 (four) times daily as needed for cough. 100 mL Volney American, [place]      PDMP not reviewed this encounter.   Volney American, [place] 07/06/21  [phone]

## 2021-07-06 NOTE — ED Triage Notes (Signed)
Onset one week ago of cough, fever, weakness

## 2021-08-05 ENCOUNTER — Ambulatory Visit (INDEPENDENT_AMBULATORY_CARE_PROVIDER_SITE_OTHER): Payer: Medicare Other | Admitting: Family Medicine

## 2021-08-05 ENCOUNTER — Other Ambulatory Visit: Payer: Self-pay

## 2021-08-05 ENCOUNTER — Encounter: Payer: Self-pay | Admitting: Family Medicine

## 2021-08-05 VITALS — BP 118/70 | HR 75 | Resp 16 | Ht 74.0 in | Wt 227.2 lb

## 2021-08-05 DIAGNOSIS — I2510 Atherosclerotic heart disease of native coronary artery without angina pectoris: Secondary | ICD-10-CM | POA: Diagnosis not present

## 2021-08-05 DIAGNOSIS — F329 Major depressive disorder, single episode, unspecified: Secondary | ICD-10-CM | POA: Diagnosis not present

## 2021-08-05 DIAGNOSIS — I10 Essential (primary) hypertension: Secondary | ICD-10-CM | POA: Diagnosis not present

## 2021-08-05 DIAGNOSIS — G4700 Insomnia, unspecified: Secondary | ICD-10-CM

## 2021-08-05 MED ORDER — CITALOPRAM HYDROBROMIDE 10 MG PO TABS: 10.0000 mg | ORAL_TABLET | Freq: Every day | ORAL | 1 refills | Status: DC

## 2021-08-05 NOTE — Progress Notes (Signed)
Chief Complaint  Patient presents with   Follow-up   HPI:  Richard Vaughn is a 70 y.o. male, who is here today with his wife for 3 months follow up.   He was last seen on 05/05/21. Since his last visit ,he was evaluated in the ED for cough, 07/06/21. He was treated with oral abx, symptoms have resolved.  Insomnia and adjustment disorder with depression: He is on Trazodone 50 mg 1/2 tab at bedtime, which is still helping with sleep. Sleeping about 6 hours. He does not feel rested.  Duloxetine 60 mg has not helped with depression nor with chronic pain (back pain and arthralgias). Missed last CBT appt, did not feel like it helped.  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 On chronic opioid treatment.  No falls since his last visit.  He feels like water exercises helps with mobility, he completed program and recommended to a different facility for re-assessment and to start wt lifting. He would like to continue PT in the same facility, he needs a new referral.  Hypertension:  Medications:Benicar 20 mg daily and Amlodipine  mg daily. BP readings at home:Around same readings that today's BP. Side effects:None  Negative for unusual or severe headache, visual changes, exertional chest pain, dyspnea,new focal weakness, or edema.  Component     Latest Ref Rng & Units 12/09/2020          Sodium     135 - 145 mEq/L 139  Potassium     3.5 - 5.1 mEq/L 3.9  Chloride     96 - 112 mEq/L 105  CO2     19 - 32 mEq/L 29  Glucose     70 - 99 mg/dL 82  BUN     6 - 23 mg/dL 12  Creatinine     0.40 - 1.50 mg/dL 0.93   Review of Systems  Constitutional:  Positive for fatigue. Negative for activity change, appetite change and fever.  HENT:  Negative for nosebleeds and sore throat.   Respiratory:  Negative for cough and wheezing.   Gastrointestinal:  Negative for abdominal pain, nausea  and vomiting.  Genitourinary:  Negative for decreased urine volume, dysuria and hematuria.  Musculoskeletal:  Positive for arthralgias, back pain and gait problem.  Skin:  Negative for pallor and rash.  Neurological:  Negative for syncope, facial asymmetry and weakness.  Psychiatric/Behavioral:  Negative for confusion. The patient is nervous/anxious.   Rest of ROS, see pertinent positives sand negatives in HPI  Current Outpatient Medications on File Prior to Visit  Medication Sig Dispense Refill   aspirin EC 81 MG tablet Take 1 tablet (81 mg total) by mouth daily. Swallow whole. 30 tablet 11   esomeprazole (NEXIUM) 40 MG capsule TAKE 1 CAPSULE (40 MG TOTAL) BY MOUTH 2 (TWO) TIMES DAILY BEFORE A MEAL. 60 capsule 2   lovastatin (MEVACOR) 20 MG tablet Take 1 tablet (20 mg total) by mouth at bedtime. 90 tablet 1   olmesartan (BENICAR) 20 MG tablet Take 1 tablet (20 mg total) by mouth daily. 90 tablet 3   oxymorphone (OPANA) 10 MG tablet Take 10 mg by mouth in the morning and at bedtime.     sucralfate (CARAFATE) 1 GM/10ML suspension Take 10 mLs (1 g total) by mouth every 6 (six) hours as needed. 420 mL 1   tiZANidine (ZANAFLEX) 4 MG tablet Take 1 tablet (4 mg total) by  mouth every 8 (eight) hours as needed for muscle spasms. 30 tablet 1   No current facility-administered medications on file prior to visit.    Past Medical History:  Diagnosis Date   Barrett's esophagus    Chronic back pain    Depression    Gastritis    Mild   Gastroparesis    GERD (gastroesophageal reflux disease)    Headache    History of kidney stones    Hyperglycemia    Hypertension    Nephrolithiasis    Pneumonia    Covid pneumonia   Renal cyst    Stricture and stenosis of esophagus    Stroke ([place]) 07-2015   Vision abnormalities    No Known Allergies  Social History   Socioeconomic History   Marital status: Married    Spouse name: Richard Vaughn   Number of children: 3   Years of education: Not on file    Highest education level: Not on file  Occupational History   Occupation: retired    Fish farm manager: [employer]  Tobacco Use   Smoking status: Former    Packs/day: 0.50    Years: 30.00    Pack years: 15.00    Types: Cigarettes    Quit date: 04/15/2018    Years since quitting: 3.3   Smokeless tobacco: Never  Vaping Use   Vaping Use: Never used  Substance and Sexual Activity   Alcohol use: No    Alcohol/week: 0.0 standard drinks   Drug use: Never    Comment: last use 04/2018   Sexual activity: Yes  Other Topics Concern   Not on file  Social History Narrative   Not on file   Social Determinants of Health   Financial Resource Strain: Not on file  Food Insecurity: Not on file  Transportation Needs: Not on file  Physical Activity: Not on file  Stress: Not on file  Social Connections: Not on file   Vitals:   08/05/21 1411  BP: 118/70  Pulse: 75  Resp: 16  SpO2: 99%   Body mass index is 29.18 kg/m.  Physical Exam Vitals and nursing note reviewed.  Constitutional:      General: He is not in acute distress.    Appearance: He is well-developed.  HENT:     Head: Normocephalic and atraumatic.  Eyes:     Conjunctiva/sclera: Conjunctivae normal.  Cardiovascular:     Rate and Rhythm: Normal rate.  Pulmonary:     Effort: Pulmonary effort is normal. No respiratory distress.  Skin:    General: Skin is warm.     Findings: No erythema or rash.  Neurological:     Mental Status: He is alert and oriented to person, place, and time.     Comments: Mildly unstable gait, not assisted.  Psychiatric:        Mood and Affect: Mood is anxious.        Speech: Speech is slurred.     Comments: Well groomed, good eye contact.   ASSESSMENT AND PLAN:  [name] was seen today for follow-up.  Orders Placed This Encounter  Procedures   Basic metabolic panel   Ambulatory referral to Physical Therapy   Lab Results  Component Value Date   CREATININE 0.95 08/05/2021   BUN 9  08/05/2021   NA 142 08/05/2021   K 3.6 08/05/2021   CL 106 08/05/2021   CO2 28 08/05/2021   Insomnia, unspecified type Problem is adequately controlled. Continue Trazodone 25 mg  at bedtime. Good sleep hygiene.  -     traZODone (DESYREL) 50 MG tablet; Take 0.5 tablets (25 mg total) by mouth at bedtime as needed for sleep.  Essential hypertension BP adequately controlled. Continue current management: Benicar and Amlodipine same dose. DASH/low salt diet to continue. Continue monitoring BP at home.  -     amLODipine (NORVASC) 2.5 MG tablet; Take 1 tablet (2.5 mg total) by mouth daily.  Depressive reaction Problem is not well controlled. Duloxetine to discontinued, he has taken it for less than 3 months. Celexa 10 mg to start, can be increased to 20 mg in a few weeks if well tolerated.He will let me know about tolerance in 4-5 weeks, before if any side effect.  We discussed some side effects as well as the risk of interaction with Trazodone but both are low doses. Recommend continuing CBT. Instructed about warning signs.  -     citalopram (CELEXA) 10 MG tablet; Take 1 tablet (10 mg total) by mouth daily.  CVD (cardiovascular disease) PT referral placed,so he can continue with therapy in same facility. Continue Aspirin 81 mg daily and Lovastatin 20 mg daily.  I spent a total of 31 minutes in both face to face and non face to face activities for this visit on the date of this encounter. During this time history was obtained and documented, examination was performed, prior labs reviewed, and assessment/plan discussed.  Return in about 4 months (around 12/05/2021).   Curtiss Mahmood G. Martin[place], MD  [facility]. [place] office.

## 2021-08-05 NOTE — Patient Instructions (Addendum)
A few things to remember from today's visit:   Insomnia, unspecified type  Essential hypertension - Plan: Basic metabolic panel  Depressive reaction - Plan: citalopram (CELEXA) 10 MG tablet  CVD (cardiovascular disease) - Plan: Ambulatory referral to Physical Therapy  If you need refills please call your pharmacy. Do not use My Chart to request refills or for acute issues that need immediate attention.   Stop Duloxetine and start Celexa 10 mg daily. Let me know in 4 weeks about tolerance, before of any side effect. Next visit fasting labs. Give a shot to psychotherapy, after a few visit you may see results.  Please be sure medication list is accurate. If a new problem present, please set up appointment sooner than planned today.

## 2021-08-06 LAB — BASIC METABOLIC PANEL
BUN: 9 mg/dL (ref 6–23)
CO2: 28 mEq/L (ref 19–32)
Calcium: 8.9 mg/dL (ref 8.4–10.5)
Chloride: 106 mEq/L (ref 96–112)
Creatinine, Ser: 0.95 mg/dL (ref 0.40–1.50)
GFR: 82.67 mL/min (ref >60.00)
Glucose, Bld: 64 mg/dL — ABNORMAL LOW (ref 70–99)
Potassium: 3.6 mEq/L (ref 3.5–5.1)
Sodium: 142 mEq/L (ref 135–145)

## 2021-08-09 ENCOUNTER — Encounter: Payer: Self-pay | Admitting: Family Medicine

## 2021-08-09 MED ORDER — AMLODIPINE BESYLATE 2.5 MG PO TABS: 2.5000 mg | ORAL_TABLET | Freq: Every day | ORAL | 3 refills | Status: DC

## 2021-08-09 MED ORDER — TRAZODONE HCL 50 MG PO TABS: 25.0000 mg | ORAL_TABLET | Freq: Every evening | ORAL | 0 refills | Status: DC | PRN

## 2021-08-13 ENCOUNTER — Other Ambulatory Visit: Payer: Self-pay | Admitting: Gastroenterology

## 2021-08-13 DIAGNOSIS — R6881 Early satiety: Secondary | ICD-10-CM

## 2021-09-18 ENCOUNTER — Other Ambulatory Visit: Payer: Self-pay | Admitting: Family Medicine

## 2021-09-18 DIAGNOSIS — G4700 Insomnia, unspecified: Secondary | ICD-10-CM

## 2021-09-29 ENCOUNTER — Ambulatory Visit (INDEPENDENT_AMBULATORY_CARE_PROVIDER_SITE_OTHER): Payer: Medicare Other | Admitting: Neurology

## 2021-09-29 ENCOUNTER — Other Ambulatory Visit: Payer: Self-pay

## 2021-09-29 VITALS — BP 111/73 | HR 62 | Ht 74.0 in | Wt 245.0 lb

## 2021-09-29 DIAGNOSIS — I2510 Atherosclerotic heart disease of native coronary artery without angina pectoris: Secondary | ICD-10-CM

## 2021-09-29 DIAGNOSIS — G8110 Spastic hemiplegia affecting unspecified side: Secondary | ICD-10-CM | POA: Diagnosis not present

## 2021-09-29 DIAGNOSIS — I69354 Hemiplegia and hemiparesis following cerebral infarction affecting left non-dominant side: Secondary | ICD-10-CM | POA: Diagnosis not present

## 2021-09-29 NOTE — Progress Notes (Signed)
[facility] [address] [place]. [place] [address] [phone]       STROKE FOLLOW UP NOTE  [name] Date of Birth:  1951/09/07 Medical Record Number:  [id number]   Reason for Referral: stroke follow up    SUBJECTIVE:   CHIEF COMPLAINT:  Chief Complaint  Patient presents with   Follow-up    Room 13, with wife states his walking has improved     HPI:   Update 05/26/2020: Richard Vaughn returns accompanied by his wife for stroke follow-up.  Sooner visit requested to be cleared for torn rotator cuff surgery. Residual deficits of left-sided weakness, gait impairment with imbalance, dysarthria and cognitive impairment.  Continues to work with outpatient neuro rehab PT/OT/ST with ongoing improvement but limited rehab of LUE due to torn rotator cuff.  Richard Vaughn did receive cortisone injection with some benefit.  Blood pressure today 112/73 - monitors at home which has been stable.  Order placed for CT head after prior visit but this has not been completed at this time.  Richard Vaughn is currently being followed by [facility] orthopedics with Dr. Malena Catholic.  No further concerns at this time.    History provided for reference purposes only Richard Vaughn is a 70 y.o. 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   Stroke work-up revealed acute right thalamic hemorrhage secondary to history of HTN.  History of HTN stable during admission with long-term BP goal normotensive range.  LDL 73 with statin contraindicated with ICH.  No history of DM.  Other stroke risk factors include advanced age, PAD, tobacco  use, history of substance abuse, obesity, prior history of stroke and family history of stroke.  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  Evaluated by therapies and recommended discharge to [name] for ongoing therapy needs and eventually discharged home on 03/07/2020.  Stroke: acute right thalamic hemorrhage secondary to hx HTN  Resultant dysarthria and left face and extremity weakness Code Stroke CT Head - Positive for acute right thalamic hemorrhage (6 mL) with mild surrounding edema and minimal mass effect. No intraventricular or extra-axial extension at this time. Underlying advanced chronic small vessel disease.  CTA H&N - No large vessel occlusion. Minimal atherosclerosis in the head and neck - although there is at least moderate stenosis suspected at the origin of the non-dominant left vertebral artery origin. MRI head W & WO - 2.1 x 2.1 x 2.2 cm acute intraparenchymal hemorrhage centered at the right thalamus, unchanged from previous. Associated mild surrounding vasogenic edema and localized regional mass effect, with up to 3 mm of localized right-to-left shift. No underlying mass lesion, abnormal enhancement, or other abnormality identified. Multiple remote lacunar infarcts involving the left basal ganglia and thalamus.  2D Echo - EF 60 - 65%. No cardiac source of emboli identified.  Sars Corona Virus 2 - negative LDL - 73 HgbA1c - 5.4  VTE prophylaxis - SCDs aspirin 81 mg daily prior to admission, now on No antithrombotic Therapy recommendations:  [name] Disposition: [name]   Today, 03/19/2020, Richard Vaughn  is being seen for hospital follow-up accompanied by his brother and wife.  Residual deficits left-sided weakness, gait impairment, dysarthria and mild to moderate cognitive communication deficits.  Continues to  participate in outpatient therapies at neuro rehab with ongoing improvement.  Use of rollator walker outside of home denies any recent falls.  Blood pressure today 122/78.  History of mixed sleep apnea.  Has self discontinued CPAP approx 6 months ago due to difficulty tolerating masks.  Trialed 2 different types of masks previously but has increased anxiety with mask use.  Recent diagnosis of rotator cuff tear and questions undergoing surgical procedure.  No further concerns at this time.   Update 09/29/2021: Richard Vaughn returns for follow-up after last visit 4 months ago.  Richard Vaughn is accompanied by his wife.  Richard Vaughn is doing well.  Richard Vaughn has had no recurrent stroke or TIA symptoms.  Continues to have mild left-sided weakness, stiffness, speech difficulties and numbness.  Is tolerating aspirin well without bruising or bleeding.  His blood pressure is quite well controlled and today it is 111/73.  Richard Vaughn has had no health issues except Richard Vaughn had COVID earlier last year and recovered very well from that.  Richard Vaughn and his wife are considering going to [facility] to consider participation in the transcranial magnetic stimulation study in the rehab program for stroke.    ROS:   14 system review of systems performed and negative with exception of weakness, numbness update update gait impairment, speech impairment  PMH:  Past Medical History:  Diagnosis Date   Barrett's esophagus    Chronic back pain    Depression    Gastritis    Mild   Gastroparesis    GERD (gastroesophageal reflux disease)    Headache    History of kidney stones    Hyperglycemia    Hypertension    Nephrolithiasis    Pneumonia    Covid pneumonia   Renal cyst    Stricture and stenosis of esophagus    Stroke [facility]) 07-2015   Vision abnormalities     PSH:  Past Surgical History:  Procedure Laterality Date   CERVICAL [place] SURGERY  2012   COLONOSCOPY     LUMBAR [place] SURGERY  2005, 2010   replaced L4 and L5   SHOULDER ARTHROSCOPY WITH ROTATOR CUFF REPAIR  Left 08/14/2020   Procedure: SHOULDER ARTHROSCOPY WITH ROTATOR CUFF REPAIR, SUBACROMIAL DECOMPRESSION;  Surgeon: Tania Ade, MD;  Location: [facility] ORS;  Service: Orthopedics;  Laterality: Left;   UPPER GASTROINTESTINAL ENDOSCOPY      Social History:  Social History   Socioeconomic History   Marital status: Married    Spouse name: Truth Barot   Number of children: 3   Years of education: Not on file   Highest education level: Not on file  Occupational History   Occupation: retired    Fish farm manager: [employer]  Tobacco Use   Smoking status: Former    Packs/day: 0.50    Years: 30.00    Pack years: 15.00    Types: Cigarettes    Quit date: 04/15/2018    Years since quitting: 3.4   Smokeless tobacco: Never  Vaping Use   Vaping Use: Never used  Substance and Sexual Activity   Alcohol use: No    Alcohol/week: 0.0 standard drinks   Drug use: Never    Comment: last use 04/2018   Sexual activity: Yes  Other Topics Concern   Not on file  Social History Narrative   Not on file  Social Determinants of Health   Financial Resource Strain: Not on file  Food Insecurity: Not on file  Transportation Needs: Not on file  Physical Activity: Not on file  Stress: Not on file  Social Connections: Not on file  Intimate Partner Violence: Not on file    Family History:  Family History  Problem Relation Age of Onset   Hypertension Father    Stroke Father    Hypertension Mother    Liver cancer Mother    Hypertension Sister        2 other sisters has also   Stroke Sister    Stroke Sister    Hypertension Brother        2nd brother has also   Colon cancer Neg Hx    Esophageal cancer Neg Hx    Rectal cancer Neg Hx    Stomach cancer Neg Hx    Colon polyps Neg Hx     Medications:   Current Outpatient Medications on File Prior to Visit  Medication Sig Dispense Refill   amLODipine (NORVASC) 2.5 MG tablet Take 1 tablet (2.5 mg total) by mouth daily. 90 tablet 3   aspirin EC 81 MG  tablet Take 1 tablet (81 mg total) by mouth daily. Swallow whole. 30 tablet 11   esomeprazole (NEXIUM) 40 MG capsule Take 1 capsule (40 mg total) by mouth 2 (two) times daily before a meal. **PLEASE CONTACT THE OFFICE TO SCHEDULE APPOINTMENT 60 capsule 1   lovastatin (MEVACOR) 20 MG tablet Take 1 tablet (20 mg total) by mouth at bedtime. 90 tablet 1   olmesartan (BENICAR) 20 MG tablet Take 1 tablet (20 mg total) by mouth daily. 90 tablet 3   oxymorphone (OPANA) 10 MG tablet Take 10 mg by mouth in the morning and at bedtime.     traZODone (DESYREL) 50 MG tablet TAKE 1 TABLET AT BEDTIME ASNEEDED FOR SLEEP 90 tablet 2   No current facility-administered medications on file prior to visit.    Allergies:  No Known Allergies    OBJECTIVE:  Physical Exam  Vitals:   09/29/21 1326  BP: 111/73  Pulse: 62  Weight: 245 lb (111.1 kg)  Height: 6\' 2"  (1.88 m)   Body mass index is 31.46 kg/m. No results found.  General: well developed, well nourished, pleasant middle-aged African-American male, seated, in no evident distress Head: head normocephalic and atraumatic.   Neck: supple with no carotid or supraclavicular bruits Cardiovascular: regular rate and rhythm, no murmurs Musculoskeletal: no deformity Skin:  no rash/petichiae Vascular:  Normal pulses all extremities   Neurologic Exam Mental Status: Awake and fully alert. moderate dysarthria. Oriented to place and time. Recent and remote memory intact. Attention span, concentration and fund of knowledge appropriate during visit. Mood and affect appropriate.  Cranial Nerves: Pupils equal, briskly reactive to light. Extraocular movements full without nystagmus. Visual fields full to confrontation. Hearing intact. Facial sensation intact. Face, tongue, palate moves normally and symmetrically.  Motor: Normal bulk and tone. Normal strength in all tested extremity muscles except mild decreased left hand dexterity.  Mild shoulder elevation limited due  to pain.  Tone is increased on the left compared to the right.  Sensory.: intact to touch , pinprick , position and vibratory sensation.  Coordination: Rapid alternating movements normal in all extremities except slightly decreased left hand. Finger-to-nose performed accurately RUE and heel-to-shin show mild LLE ataxia. Gait and Station: Arises from chair without difficulty. Stance is normal. Gait demonstrates  hemiplegic gait with use of  walker.  With slight dragging of the left leg.  Tandem walk not attempted Reflexes: 1+ and symmetric. Toes downgoing.         ASSESSMENT: Richard Vaughn is a 70 y.o. year old male with recent [place] history in right thalamus/posterior limb in 2019 with residual mild left-sided weakness who presented with sudden onset of worsening dysarthria and left-sided weakness on 02/22/2020 with stroke work-up revealing acute right thalamic hemorrhage secondary to history of HTN. Vascular risk factors include multiple prior strokes including lacunar infarcts and small hypertensive bleeds, HTN, HLD, PAD, sleep apnea, history of substance abuse and family history of stroke.    Residual deficits of subjective left-sided weakness, mild dysarthria, gait impairment and mild cognitive communication deficit     PLAN:  I had a long discussion the patient with regarding his remote subcortical intracerebral hemorrhage and residual spastic left hemiparesis and answered questions.  Continue strict control of hypertension with blood pressure goal below 130/90.  Continue aspirin for stroke prevention and aggressive risk factor modification.  Continue to use a cane for ambulation and fall and safety precautions.  Patient and wife inquired about transcranial magnetic stimulation program at [facility] to help with his left-sided weakness.  I informed them that I was unaware that this was FDA approved specifically for stroke but encouraged him to seek more information from [facility] to see if  Richard Vaughn qualified for clinical trial that they were involved in.  Return follow-up in the future in a year or call earlier if necessary.  I spent 25 minutes of face-to-face and non-face-to-face time with patient and wife.  This included previsit chart review, lab review, study review, order entry, electronic health record documentation, patient education regarding surgical clearance, recent stroke, residual deficits, importance of managing stroke risk factors and answered all questions to patient and wife's satisfaction   Antony Contras, [facility] [address] [place], [place] [address]  Phone [phone] Fax [phone] Note: This document was prepared with digital dictation and possible smart phrase technology. Any transcriptional errors that result from this process are unintentional.

## 2021-09-29 NOTE — Patient Instructions (Signed)
I had a long discussion the patient with regarding his remote subcortical intracerebral hemorrhage and residual spastic left hemiparesis and answered questions.  Continue strict control of hypertension with blood pressure goal below 130/90.  Continue aspirin for stroke prevention and aggressive risk factor modification.  Continue to use a cane for ambulation and fall and safety precautions.  Patient and wife inquired about transcranial magnetic stimulation program at [facility] to help with his left-sided weakness.  I informed them that I was unaware that this was FDA approved specifically for stroke but encouraged him to seek more information from [facility] to see if he qualified for clinical trial that they were involved in.  Return follow-up in the future in a year or call earlier if necessary.

## 2021-09-30 ENCOUNTER — Other Ambulatory Visit: Payer: Self-pay | Admitting: Family Medicine

## 2021-09-30 ENCOUNTER — Telehealth: Payer: Self-pay | Admitting: Neurology

## 2021-09-30 NOTE — Telephone Encounter (Signed)
Medicare/bcbs fed no Richard Vaughn, sent Butch Penny a message she will reach out to the patient to schedule.

## 2021-10-06 ENCOUNTER — Ambulatory Visit (HOSPITAL_COMMUNITY): Payer: Medicare Other | Attending: Neurology

## 2021-10-12 ENCOUNTER — Ambulatory Visit: Payer: Medicare Other | Attending: Internal Medicine

## 2021-10-12 ENCOUNTER — Other Ambulatory Visit (HOSPITAL_BASED_OUTPATIENT_CLINIC_OR_DEPARTMENT_OTHER): Payer: Self-pay

## 2021-10-12 ENCOUNTER — Other Ambulatory Visit: Payer: Self-pay

## 2021-10-12 DIAGNOSIS — Z23 Encounter for immunization: Secondary | ICD-10-CM

## 2021-10-12 MED ORDER — PFIZER COVID-19 VAC BIVALENT 30 MCG/0.3ML IM SUSP
INTRAMUSCULAR | 0 refills | Status: DC
  Filled 2021-10-12: qty 0.3, 1d supply, fill #0

## 2021-10-12 NOTE — Progress Notes (Signed)
   Covid-19 Vaccination Clinic  Name:  Richard Vaughn    MRN: [id number] DOB: 31-Mar-1951  10/12/2021  [name] was observed post Covid-19 immunization for 15 minutes without incident. He was provided with Vaccine Information Sheet and instruction to access the V-Safe system.   [name] was instructed to call 911 with any severe reactions post vaccine: Difficulty breathing  Swelling of face and throat  A fast heartbeat  A bad rash all over body  Dizziness and weakness   Immunizations Administered     Name Date Dose VIS Date Route   Pfizer Covid-19 Vaccine Bivalent Booster 10/12/2021  2:50 PM 0.3 mL 07/15/2021 Intramuscular   Manufacturer: [place]   Lot: [id number]   [place]: [phone]

## 2021-10-22 ENCOUNTER — Encounter: Payer: Self-pay | Admitting: Family Medicine

## 2021-10-22 DIAGNOSIS — I2510 Atherosclerotic heart disease of native coronary artery without angina pectoris: Secondary | ICD-10-CM

## 2021-10-22 DIAGNOSIS — G894 Chronic pain syndrome: Secondary | ICD-10-CM

## 2021-10-28 ENCOUNTER — Encounter: Payer: Self-pay | Admitting: Family Medicine

## 2021-11-02 ENCOUNTER — Ambulatory Visit (HOSPITAL_COMMUNITY)
Admission: RE | Admit: 2021-11-02 | Discharge: 2021-11-02 | Disposition: A | Discharge status: Home / Self Care | Payer: Medicare Other | Source: Ambulatory Visit | Attending: Neurology | Admitting: Neurology

## 2021-11-02 ENCOUNTER — Other Ambulatory Visit: Payer: Self-pay

## 2021-11-02 DIAGNOSIS — I69354 Hemiplegia and hemiparesis following cerebral infarction affecting left non-dominant side: Secondary | ICD-10-CM | POA: Insufficient documentation

## 2021-11-03 ENCOUNTER — Ambulatory Visit: Payer: Medicare Other | Attending: Family Medicine

## 2021-11-03 ENCOUNTER — Other Ambulatory Visit: Payer: Self-pay

## 2021-11-03 DIAGNOSIS — I2510 Atherosclerotic heart disease of native coronary artery without angina pectoris: Secondary | ICD-10-CM | POA: Diagnosis not present

## 2021-11-03 DIAGNOSIS — R2681 Unsteadiness on feet: Secondary | ICD-10-CM | POA: Insufficient documentation

## 2021-11-03 DIAGNOSIS — M6281 Muscle weakness (generalized): Secondary | ICD-10-CM | POA: Insufficient documentation

## 2021-11-03 DIAGNOSIS — G894 Chronic pain syndrome: Secondary | ICD-10-CM | POA: Diagnosis not present

## 2021-11-03 DIAGNOSIS — I69354 Hemiplegia and hemiparesis following cerebral infarction affecting left non-dominant side: Secondary | ICD-10-CM | POA: Insufficient documentation

## 2021-11-03 DIAGNOSIS — R296 Repeated falls: Secondary | ICD-10-CM | POA: Diagnosis not present

## 2021-11-03 DIAGNOSIS — R2689 Other abnormalities of gait and mobility: Secondary | ICD-10-CM | POA: Diagnosis not present

## 2021-11-03 NOTE — Therapy (Signed)
[place] @ [facility] [place], [place], [address] Phone: [phone]   Fax:  [phone]  Physical Therapy Evaluation  Patient Details  Name: Richard Vaughn MRN: [id number] Date of Birth: 05-23-1951 Referring Provider (PT): Betty Martinique, MD   Encounter Date: 11/03/2021   PT End of Session - 11/03/21 1748     Visit Number 1    Date for PT Re-Evaluation 12/29/21    Authorization Type Medicare    Progress Note Due on Visit 10    PT Start Time 1445    PT Stop Time 5361    PT Time Calculation (min) 48 min    Activity Tolerance Patient tolerated treatment well    Behavior During Therapy [facility] for tasks assessed/performed             Past Medical History:  Diagnosis Date   Barrett's esophagus    Chronic back pain    Depression    Gastritis    Mild   Gastroparesis    GERD (gastroesophageal reflux disease)    Headache    History of kidney stones    Hyperglycemia    Hypertension    Nephrolithiasis    Pneumonia    Covid pneumonia   Renal cyst    Stricture and stenosis of esophagus    Stroke [facility]) 07-2015   Vision abnormalities     Past Surgical History:  Procedure Laterality Date   CERVICAL [place] SURGERY  2012   COLONOSCOPY     LUMBAR [place] SURGERY  2005, 2010   replaced L4 and L5   SHOULDER ARTHROSCOPY WITH ROTATOR CUFF REPAIR Left 08/14/2020   Procedure: SHOULDER ARTHROSCOPY WITH ROTATOR CUFF REPAIR, SUBACROMIAL DECOMPRESSION;  Surgeon: Tania Ade, MD;  Location: [facility];  Service: Orthopedics;  Laterality: Left;   UPPER GASTROINTESTINAL ENDOSCOPY      There were no vitals filed for this visit.    Subjective Assessment - 11/03/21 1510     Subjective Patient is 70 y.o. male who has hx of stroke.  He has fallen about 3 times in the past 6 months. He has chronic low back pain and would like to get strong enough to get back to the gym.  His primary concern is his unsteady walking.  He has been dealing  with the back pain for quite some time and knows that if he got back to the gym, he would likely be able to work on getting stronger and his back would feel better.  He and his wife walk daily.  She is present with him and states that he begins to drag his left foot after about 15 min and he has fallen before during one of their walks.  His goal is to reduce falls and fall risk and be able to get back to the gym.    Limitations Walking    How long can you walk comfortably? 15 min    Diagnostic tests none recent    Patient Stated Goals to reduce falls and fall risk and be able to get back to the gym.    Currently in Pain? No/denies    Pain Score 0-No pain                [facility] PT Assessment - 11/03/21 0001       Assessment   Medical Diagnosis chronic pain    Referring Provider (PT) Betty Martinique, MD    Onset Date/Surgical Date 11/15/20    Hand Dominance Right  Next MD Visit as needed      Precautions   Precautions Fall      Balance Screen   Has the patient fallen in the past 6 months Yes    How many times? 3    Has the patient had a decrease in activity level because of a fear of falling?  Yes    Is the patient reluctant to leave their home because of a fear of falling?  Yes      [place] Private residence    Living Arrangements Spouse/significant other    Type of [place] to enter    Entrance Stairs-Number of Steps 4    Entrance Stairs-Rails Left    [place] Two level;Able to live on main level with bedroom/bathroom    [place] - single point      Prior Function   Level of Independence Independent with basic ADLs;Independent with household mobility with device    Vocation Retired    Leisure enjoys walking outdoors and going to the gym      Cognition   Overall Cognitive Status Within Functional Limits for tasks assessed      Observation/Other Assessments   Observations Speech impairment from stroke       Sensation   Light Touch Appears Intact    Proprioception Impaired Detail    Proprioception Impaired Details Impaired LUE      Functional Tests   Functional tests Sit to Stand      Sit to Stand   Comments Must use both hands to complete safely but can do indpendently      ROM / Strength   AROM / PROM / Strength AROM;Strength      AROM   Overall AROM  Deficits    Overall AROM Comments 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 but also had stroke affecting this UE can reach to approx 45 degrees flexion      Strength   Overall Strength Deficits    Overall Strength Comments right UE and LE generally 5- to 5/5, Left UE 3/5 , Left LE generally 4 to 4+/5      Transfers   Transfers Sit to Stand    Sit to Stand 6: Modified independent (Device/Increase time)    Five time sit to stand comments  26.37 sec      Ambulation/Gait   Ambulation/Gait Yes    Ambulation/Gait Assistance 6: Modified independent (Device/Increase time)    Ambulation Distance (Feet) 100 Feet    Assistive device Straight cane    Gait Pattern Step-through pattern;Decreased arm swing - left;Poor foot clearance - left    Ambulation Surface Level      Balance   Balance Assessed Yes      Standardized Balance Assessment   Standardized Balance Assessment Timed Up and Go Test      Timed Up and Go Test   TUG Normal TUG    Normal TUG (seconds) 27.75                        Objective measurements completed on examination: See above findings.                PT Education - 11/03/21 1746     Education Details Educated patient and wife of proper heel to toe gait and proper step length to allow for good foot clearance.    Person(s)  Educated Patient;Spouse    Methods Explanation;Demonstration    Comprehension Verbalized understanding;Returned demonstration;Verbal cues required              PT Short Term Goals - 11/03/21 1758       PT SHORT TERM GOAL #1   Title  Patient to be indepdent with initial HEP    Time 4    Period Weeks    Status New    Target Date 12/01/21      PT SHORT TERM GOAL #2   Title Patient will report no falls for 4 weeks    Time 4    Period Weeks    Status New    Target Date 12/01/21               PT Long Term Goals - 11/03/21 1759       PT LONG TERM GOAL #1   Title Patient will be indepdnent with a full pool program they can carryover at the [facility]    Time 8    Period Weeks    Status New    Target Date 12/29/21      PT LONG TERM GOAL #2   Title TUG score to be 20 sec or less    Time 8    Period Weeks    Status New    Target Date 12/29/21      PT LONG TERM GOAL #3   Title 5 times sit to stand will be 18 sec or less    Time 8    Period Weeks    Status New    Target Date 12/29/21      PT LONG TERM GOAL #4   Title Patient will be able to ambulate with correct sequencing with spc safely for 100 feet    Time 8    Period Weeks    Status New    Target Date 12/29/21                    Plan - 11/03/21 1749     Clinical Impression Statement Patient is a 70 y.o. male with history of cardiovascular issues that resulted in CVA affecting his left UE.  He also has chronic low back pain.  He has fallen 3 times in the past 6 months all without injury but has been hesitant to leave home due to fall risk.  He walks regularly with his wife but wife seems to feel uneasy as he has been falling more frequently.  He presents with functional ROM with exception of left UE.  He has good strength in right UE and bilateral LE's but left UE is fairly non functional.  He ambulates with SPC but is dragging his cane in left UE and has some instances of left foot dragging.  He places his left UE in his pocket due to it hanging and being uncomfortable when he walks.  His TUG score and 5 times sit to stand score place him in a high fall risk category.  He would benefit from LE stability and balance training, gait and transfer  training and fall prevention.  His goal is to gain confidence and steadiness enough to start going back to the gym.    Personal Factors and Comorbidities Comorbidity 1;Comorbidity 2    Comorbidities Hx CVA and chronic low back pain    Examination-Activity Limitations Bathing;Stand;Stairs    Examination-Participation Restrictions Driving;Community Activity    Stability/Clinical Decision Making Evolving/Moderate complexity    Clinical Decision  Making Moderate    Rehab Potential Good    PT Frequency 2x / week    PT Duration 8 weeks    PT Treatment/Interventions ADLs/Self Care Home Management;Aquatic Therapy;Traction;Moist Heat;Iontophoresis 4mg /ml Dexamethasone;Electrical Stimulation;Cryotherapy;Ultrasound;DME Instruction;Gait training;Therapeutic exercise;Therapeutic activities;Functional mobility training;Stair training;Balance training;Neuromuscular re-education;Patient/family education;Manual techniques;Passive range of motion;Vestibular;Energy conservation;Dry needling    PT Next Visit Plan Begin Nu step, LE strengthening and balance training    PT Home Exercise Plan [place] to work on proper sequencing with SPC    Consulted and Agree with Plan of Care Patient             Patient will benefit from skilled therapeutic intervention in order to improve the following deficits and impairments:  Abnormal gait, Decreased balance, Decreased endurance, Decreased mobility, Difficulty walking, Impaired sensation, Decreased range of motion, Decreased coordination, Decreased knowledge of use of DME, Decreased safety awareness, Decreased strength, Impaired UE functional use  Visit Diagnosis: Repeated falls - Plan: PT plan of care cert/re-cert  Muscle weakness (generalized) - Plan: PT plan of care cert/re-cert  Hemiplegia and hemiparesis following cerebral infarction affecting left non-dominant side ([place]) - Plan: PT plan of care cert/re-cert  Unsteadiness on feet - Plan: PT plan of care  cert/re-cert  Other abnormalities of gait and mobility - Plan: PT plan of care cert/re-cert     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) 10/25/2018   Chronic pain syndrome    Chronic bilateral low back pain    Benign essential HTN    Tobacco abuse    Marijuana abuse    Hyperlipidemia    History of CVA with residual deficit    Dysphagia, post-stroke    ICH (intracerebral hemorrhage) ([place]) - R thalmic/PLIC d/t HTN 12/45/8099   Insomnia 07/13/2017   PAD (peripheral artery disease) ([place]) 07/13/2017   Hyperlipidemia with target LDL less than 70 07/13/2017   Stroke-like symptom 09/22/2016   Paresthesia 09/22/2016   CVA (cerebral vascular accident) ([place]) 09/22/2016   Cerebrovascular accident (CVA) due to thrombosis of left carotid artery ([place]) 09/23/2015   Primary snoring 09/23/2015   Hypersomnia with sleep apnea 09/23/2015   Obstructive sleep apnea 09/23/2015   Lacunar infarct, acute ([place]) 08/25/2015   Sleep apnea 08/25/2015   Dysarthria    CVD (cardiovascular disease) 08/02/2015   Acute hyperglycemia 83/38/2505   Systolic hypertension with cerebrovascular disease 12/27/2014   Epistaxis 07/28/2012   HYPERGLYCEMIA 10/02/2010   NEPHROLITHIASIS 05/23/2009   BARRETTS ESOPHAGUS 02/20/2009   Gastroparesis 02/20/2009   RADICULOPATHY 10/21/2008   GERD 10/08/2008   ESOPHAGITIS 08/15/2008   ESOPHAGEAL STRICTURE 08/15/2008   GASTRITIS, ACUTE 08/15/2008   RENAL CYST 08/14/2008   TOBACCO ABUSE 08/08/2008   HEMATURIA, MICROSCOPIC, HX OF 08/08/2008   PULMONARY NODULE 01/10/2008    Shervon Kerwin B. [place], PT 12/20/226:04 PM   [place] @  [facility] [place], [place], [address] Phone: [phone]   Fax:  [phone]  Name: Richard Vaughn MRN: [id number] Date of Birth: 04/27/1951

## 2021-11-03 NOTE — Patient Instructions (Signed)
Instructed patient in correct sequencing of cane with left LE.  Patient was carrying the cane most of the time which was interfering with his right foot.  Instructed patient to focus on heel strike and increased step length to allow for foot clearance.  Also instructed wife to give feedback on these techniques.

## 2021-11-05 ENCOUNTER — Ambulatory Visit: Payer: Medicare Other

## 2021-11-05 ENCOUNTER — Other Ambulatory Visit: Payer: Self-pay

## 2021-11-05 DIAGNOSIS — R296 Repeated falls: Secondary | ICD-10-CM

## 2021-11-05 DIAGNOSIS — M6281 Muscle weakness (generalized): Secondary | ICD-10-CM | POA: Diagnosis not present

## 2021-11-05 DIAGNOSIS — I69354 Hemiplegia and hemiparesis following cerebral infarction affecting left non-dominant side: Secondary | ICD-10-CM | POA: Diagnosis not present

## 2021-11-05 DIAGNOSIS — R2681 Unsteadiness on feet: Secondary | ICD-10-CM | POA: Diagnosis not present

## 2021-11-05 DIAGNOSIS — R2689 Other abnormalities of gait and mobility: Secondary | ICD-10-CM | POA: Diagnosis not present

## 2021-11-05 DIAGNOSIS — I2510 Atherosclerotic heart disease of native coronary artery without angina pectoris: Secondary | ICD-10-CM | POA: Diagnosis not present

## 2021-11-05 NOTE — Therapy (Signed)
[facility] @ [facility], [place], [address] Phone: [phone]   Fax:  [phone]  Physical Therapy Treatment  Patient Details  Name: Richard Vaughn MRN: [id number] Date of Birth: [birth year] Referring Provider (PT): Betty Martinique, MD   Encounter Date: 11/05/2021   PT End of Session - 11/05/21 1556     Visit Number 2    Date for PT Re-Evaluation 12/29/21    Authorization Type Medicare    Progress Note Due on Visit 10    PT Start Time 1400    PT Stop Time 1440    PT Time Calculation (min) 40 min    Activity Tolerance Patient tolerated treatment well    Behavior During Therapy [facility] for tasks assessed/performed             Past Medical History:  Diagnosis Date   Barrett's esophagus    Chronic back pain    Depression    Gastritis    Mild   Gastroparesis    GERD (gastroesophageal reflux disease)    Headache    History of kidney stones    Hyperglycemia    Hypertension    Nephrolithiasis    Pneumonia    Covid pneumonia   Renal cyst    Stricture and stenosis of esophagus    Stroke [facility]) 07-2015   Vision abnormalities     Past Surgical History:  Procedure Laterality Date   CERVICAL [place] SURGERY  2012   COLONOSCOPY     LUMBAR [place] SURGERY  2005, 2010   replaced L4 and L5   SHOULDER ARTHROSCOPY WITH ROTATOR CUFF REPAIR Left 08/14/2020   Procedure: SHOULDER ARTHROSCOPY WITH ROTATOR CUFF REPAIR, SUBACROMIAL DECOMPRESSION;  Surgeon: Tania Ade, MD;  Location: [facility];  Service: Orthopedics;  Laterality: Left;   UPPER GASTROINTESTINAL ENDOSCOPY      There were no vitals filed for this visit.   Subjective Assessment - 11/05/21 1403     Subjective Patient arrives with spouse.  He is using single point cane and ambulates with unsteady gait but wife states no falls since last visit.  Patient denies any pain and is in no acute distress.    Limitations Walking    How long can you walk comfortably? 15  min    Diagnostic tests none recent    Patient Stated Goals to reduce falls and fall risk and be able to get back to the gym.    Currently in Pain? No/denies                               [facility] Adult PT Treatment/Exercise - 11/05/21 0001       High Level Balance   High Level Balance Activities Other (comment)    High Level Balance Comments left foot tap up on foot stool 2 x 10 with right UE support on counter      Exercises   Exercises Knee/Hip;Lumbar;Ankle      Knee/Hip Exercises: Aerobic   Nustep Level 3 , seat and arms 13, x 5 min      Knee/Hip Exercises: Standing   Hip Flexion Stengthening;Left;2 sets;10 reps    Hip Abduction Stengthening;Left;1 set;20 reps    Other Standing Knee Exercises step over hurdles x 3 laps leading with left foot      Knee/Hip Exercises: Seated   Long Arc Quad Strengthening;Both;2 sets;10 reps;Weights    Long Arc Quad Weight 4 lbs.  Marching Strengthening;Both;1 set;20 reps;Weights    Marching Weights 4 lbs.    Sit to Sand 10 reps;without UE support   from mat table     Knee/Hip Exercises: Supine   Bridges Strengthening;Both;2 sets;10 reps    Straight Leg Raises Left;10 reps;2 sets      Ankle Exercises: Seated   Toe Raise 20 reps   seated left only                    PT Education - 11/05/21 1430     Education Details Access Code: [email or web address]              PT Short Term Goals - 11/03/21 1758       PT SHORT TERM GOAL #1   Title Patient to be indepdent with initial HEP    Time 4    Period Weeks    Status New    Target Date 12/01/21      PT SHORT TERM GOAL #2   Title Patient will report no falls for 4 weeks    Time 4    Period Weeks    Status New    Target Date 12/01/21               PT Long Term Goals - 11/03/21 1759       PT LONG TERM GOAL #1   Title Patient will be indepdnent with a full pool program they can carryover at the [facility]    Time 8    Period Weeks    Status New     Target Date 12/29/21      PT LONG TERM GOAL #2   Title TUG score to be 20 sec or less    Time 8    Period Weeks    Status New    Target Date 12/29/21      PT LONG TERM GOAL #3   Title 5 times sit to stand will be 18 sec or less    Time 8    Period Weeks    Status New    Target Date 12/29/21      PT LONG TERM GOAL #4   Title Patient will be able to ambulate with correct sequencing with spc safely for 100 feet    Time 8    Period Weeks    Status New    Target Date 12/29/21                   Plan - 11/05/21 1557     Clinical Impression Statement Mr. Bart had some difficulty with balance tasks and had several losses of balance during balance training.  He was able to do supine strengthening exercises with ease.  He needed heavy verbal cues during sit to stand for weight shifting fwd.  He did very well during gait training with increasing step length on left LE.  He was able to do consistent heel to toe progression for approx 50 feet before needing verbal cues.  He would benefit from continuing skilled PT for left LE strengthening and balance training.    Personal Factors and Comorbidities Comorbidity 1;Comorbidity 2    Comorbidities Hx CVA and chronic low back pain    Examination-Activity Limitations Bathing;Stand;Stairs    Stability/Clinical Decision Making Evolving/Moderate complexity    Clinical Decision Making Moderate    Rehab Potential Good    PT Frequency 2x / week    PT Duration 8 weeks    PT Treatment/Interventions  ADLs/Self Care Home Management;Aquatic Therapy;Traction;Moist Heat;Iontophoresis 4mg /ml Dexamethasone;Electrical Stimulation;Cryotherapy;Ultrasound;DME Instruction;Gait training;Therapeutic exercise;Therapeutic activities;Functional mobility training;Stair training;Balance training;Neuromuscular re-education;Patient/family education;Manual techniques;Passive range of motion;Vestibular;Energy conservation;Dry needling    PT Next Visit Plan Nu step, Left LE  strengthening, balance training, gait training for increased left step length    PT Home Exercise Plan Access Code: [email or web address]  URL: [email or web address]  Date: 11/05/2021  Prepared by: Candyce Churn    Exercises  Sit to Stand - 1 x daily - 7 x weekly - 1 sets - 5 reps  Sit to Stand Without Arm Support - 1 x daily - 7 x weekly - 1 sets - 5 reps  Seated Toe Raise - 1 x daily - 7 x weekly - 1 sets - 20 reps  Seated Long Arc Quad - 1 x daily - 7 x weekly - 1 sets - 20 reps  Seated [place] - 1 x daily - 7 x weekly - 1 sets - 20 reps    Consulted and Agree with Plan of Care Patient             Patient will benefit from skilled therapeutic intervention in order to improve the following deficits and impairments:  Abnormal gait, Decreased balance, Decreased endurance, Decreased mobility, Difficulty walking, Impaired sensation, Decreased range of motion, Decreased coordination, Decreased knowledge of use of DME, Decreased safety awareness, Decreased strength, Impaired UE functional use  Visit Diagnosis: Repeated falls  Muscle weakness (generalized)  Unsteadiness on feet     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) 10/25/2018   Chronic pain syndrome    Chronic bilateral low back pain    Benign essential HTN    Tobacco abuse    Marijuana abuse    Hyperlipidemia    History of CVA with residual deficit    Dysphagia, post-stroke    ICH (intracerebral hemorrhage) ([facility]) - R thalmic/PLIC d/t HTN 29/52/8413   Insomnia 07/13/2017   PAD (peripheral artery disease) ([place]) 07/13/2017   Hyperlipidemia with target LDL less than 70 07/13/2017   Stroke-like symptom 09/22/2016   Paresthesia  09/22/2016   CVA (cerebral vascular accident) ([place]) 09/22/2016   Cerebrovascular accident (CVA) due to thrombosis of left carotid artery ([place]) 09/23/2015   Primary snoring 09/23/2015   Hypersomnia with sleep apnea 09/23/2015   Obstructive sleep apnea 09/23/2015   Lacunar infarct, acute ([place]) 08/25/2015   Sleep apnea 08/25/2015   Dysarthria    CVD (cardiovascular disease) 08/02/2015   Acute hyperglycemia 24/40/1027   Systolic hypertension with cerebrovascular disease 12/27/2014   Epistaxis 07/28/2012   HYPERGLYCEMIA 10/02/2010   NEPHROLITHIASIS 05/23/2009   BARRETTS ESOPHAGUS 02/20/2009   Gastroparesis 02/20/2009   RADICULOPATHY 10/21/2008   GERD 10/08/2008   ESOPHAGITIS 08/15/2008   ESOPHAGEAL STRICTURE 08/15/2008   GASTRITIS, ACUTE 08/15/2008   RENAL CYST 08/14/2008   TOBACCO ABUSE 08/08/2008   HEMATURIA, MICROSCOPIC, HX OF 08/08/2008   PULMONARY NODULE 01/10/2008    Samari Bittinger B. [place], PT 12/22/224:06 PM   [place] @ [place], [place], [address] Phone: [phone]   Fax:  [phone]  Name: DEX BLAKELY MRN: [id number] Date of Birth: 1951/09/17

## 2021-11-05 NOTE — Patient Instructions (Signed)
Access Code: [email or web address] URL: [email or web address] Date: 11/05/2021 Prepared by: Candyce Churn  Exercises Sit to Stand Without Arm Support - 2 x daily - 7 x weekly - 2 sets - 10 reps Seated Long Arc Quad - 2 x daily - 7 x weekly - 2 sets - 10 reps Supine Straight Leg Raises - 2 x daily - 7 x weekly - 2 sets - 10 reps Supine Hip Abduction - 2 x daily - 7 x weekly - 2 sets - 10 reps Supine Heel Slide - 2 x daily - 7 x weekly - 2 sets - 10 reps Supine Bridge - 2 x daily - 7 x weekly - 2 sets - 10 reps

## 2021-11-10 ENCOUNTER — Ambulatory Visit: Payer: Medicare Other

## 2021-11-10 ENCOUNTER — Other Ambulatory Visit: Payer: Self-pay

## 2021-11-10 DIAGNOSIS — R2681 Unsteadiness on feet: Secondary | ICD-10-CM

## 2021-11-10 DIAGNOSIS — M6281 Muscle weakness (generalized): Secondary | ICD-10-CM

## 2021-11-10 DIAGNOSIS — R2689 Other abnormalities of gait and mobility: Secondary | ICD-10-CM | POA: Diagnosis not present

## 2021-11-10 DIAGNOSIS — R296 Repeated falls: Secondary | ICD-10-CM | POA: Diagnosis not present

## 2021-11-10 DIAGNOSIS — I69354 Hemiplegia and hemiparesis following cerebral infarction affecting left non-dominant side: Secondary | ICD-10-CM | POA: Diagnosis not present

## 2021-11-10 DIAGNOSIS — I2510 Atherosclerotic heart disease of native coronary artery without angina pectoris: Secondary | ICD-10-CM | POA: Diagnosis not present

## 2021-11-10 NOTE — Therapy (Signed)
[place] @ [place], [place], [address] Phone: [phone]   Fax:  [phone]  Physical Therapy Treatment  Patient Details  Name: Richard Vaughn MRN: [id number] Date of Birth: [birth year] Referring Provider (Richard Vaughn): Betty Martinique, MD   Encounter Date: 11/10/2021   Richard Vaughn End of Session - 11/10/21 1058     Visit Number 3    Date for Richard Vaughn Re-Evaluation 12/29/21    Authorization Type Medicare    Progress Note Due on Visit 10    Richard Vaughn Start Time 1017    Richard Vaughn Stop Time 1101    Richard Vaughn Time Calculation (min) 44 min    Activity Tolerance Patient tolerated treatment well    Behavior During Therapy [facility] for tasks assessed/performed             Past Medical History:  Diagnosis Date   Barrett's esophagus    Chronic back pain    Depression    Gastritis    Mild   Gastroparesis    GERD (gastroesophageal reflux disease)    Headache    History of kidney stones    Hyperglycemia    Hypertension    Nephrolithiasis    Pneumonia    Covid pneumonia   Renal cyst    Stricture and stenosis of esophagus    Stroke [facility]) 07-2015   Vision abnormalities     Past Surgical History:  Procedure Laterality Date   CERVICAL [place] SURGERY  2012   COLONOSCOPY     LUMBAR [place] SURGERY  2005, 2010   replaced L4 and L5   SHOULDER ARTHROSCOPY WITH ROTATOR CUFF REPAIR Left 08/14/2020   Procedure: SHOULDER ARTHROSCOPY WITH ROTATOR CUFF REPAIR, SUBACROMIAL DECOMPRESSION;  Surgeon: Tania Ade, MD;  Location: [facility];  Service: Orthopedics;  Laterality: Left;   UPPER GASTROINTESTINAL ENDOSCOPY      There were no vitals filed for this visit.   Subjective Assessment - 11/10/21 1108     Subjective I am doing my exercises at home with 5# ankle weights    Patient Stated Goals to reduce falls and fall risk and be able to get back to the gym.    Currently in Pain? No/denies                               [facility] Adult Richard Vaughn  Treatment/Exercise - 11/10/21 0001       High Level Balance   High Level Balance Activities Other (comment)    High Level Balance Comments left foot tap up on foot stool 2 x 10 with right UE support on counter      Knee/Hip Exercises: Aerobic   Nustep Level 3 , seat and arms 13, x 6 min   Richard Vaughn present to monitor for fatigue     Knee/Hip Exercises: Standing   Hip Flexion Stengthening;Left;2 sets;10 reps    Hip Abduction Stengthening;Left;20 reps;2 sets;Knee straight    Abduction Limitations 5# added    Hip Extension Stengthening;Both;2 sets;10 reps;Knee straight    Extension Limitations 5#    Other Standing Knee Exercises step over hurdle x 10  leading with left foot.  Forward/reverse and side step over Rt and Lt.      Knee/Hip Exercises: Seated   Long Arc Quad Strengthening;Both;2 sets;10 reps;Weights    Long Arc Quad Weight 5 lbs.    Marching Strengthening;Both;1 set;20 reps;Weights    Marching Weights 5 lbs.    Sit  to Sand 10 reps;without UE support   pad in seat                      Richard Vaughn Short Term Goals - 11/03/21 1758       Richard Vaughn SHORT TERM GOAL #1   Title Patient to be indepdent with initial HEP    Time 4    Period Weeks    Status New    Target Date 12/01/21      Richard Vaughn SHORT TERM GOAL #2   Title Patient will report no falls for 4 weeks    Time 4    Period Weeks    Status New    Target Date 12/01/21               Richard Vaughn Long Term Goals - 11/03/21 1759       Richard Vaughn LONG TERM GOAL #1   Title Patient will be indepdnent with a full pool program they can [facility]over at [facility]    Time 8    Period Weeks    Status New    Target Date 12/29/21      Richard Vaughn LONG TERM GOAL #2   Title TUG score to be 20 sec or less    Time 8    Period Weeks    Status New    Target Date 12/29/21      Richard Vaughn LONG TERM GOAL #3   Title 5 times sit to stand will be 18 sec or less    Time 8    Period Weeks    Status New    Target Date 12/29/21      Richard Vaughn LONG TERM GOAL #4   Title Patient  will be able to ambulate with correct sequencing with spc safely for 100 feet    Time 8    Period Weeks    Status New    Target Date 12/29/21                   Plan - 11/10/21 1108     Clinical Impression Statement Richard Vaughn did well with addition of 5# ankle weights to standing hip exercises today.  Richard Vaughn performs sit to stand with min to moderate UE support with good control with pad in chair for elevated surface.  Richard Vaughn requires stand by assist and min guard for safety with balance activities and verbal cues for weight shift Lt.  He would benefit from continuing skilled Richard Vaughn for left LE strengthening and balance training.    Richard Vaughn Frequency 2x / week    Richard Vaughn Duration 8 weeks    Richard Vaughn Treatment/Interventions ADLs/Self Care Home Management;Aquatic Therapy;Traction;Moist Heat;Iontophoresis 4mg /ml Dexamethasone;Electrical Stimulation;Cryotherapy;Ultrasound;DME Instruction;Gait training;Therapeutic exercise;Therapeutic activities;Functional mobility training;Stair training;Balance training;Neuromuscular re-education;Patient/family education;Manual techniques;Passive range of motion;Vestibular;Energy conservation;Dry needling    Richard Vaughn Next Visit Plan Nu step, Left LE strengthening, balance training, gait training for increased left step length    Consulted and Agree with Plan of Care Patient             Patient will benefit from skilled therapeutic intervention in order to improve the following deficits and impairments:  Abnormal gait, Decreased balance, Decreased endurance, Decreased mobility, Difficulty walking, Impaired sensation, Decreased range of motion, Decreased coordination, Decreased knowledge of use of DME, Decreased safety awareness, Decreased strength, Impaired UE functional use  Visit Diagnosis: Repeated falls  Muscle weakness (generalized)  Unsteadiness on feet     Problem List Patient Active Problem List   Diagnosis Date Noted  Depressive reaction    Sleep disturbance    Urinary  retention    Bradycardia    Essential hypertension    Temperature elevated    Left thyroid nodule    Treatment-emergent central sleep apnea 10/25/2018   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) 10/25/2018   Chronic pain syndrome    Chronic bilateral low back pain    Benign essential HTN    Tobacco abuse    Marijuana abuse    Hyperlipidemia    History of CVA with residual deficit    Dysphagia, post-stroke    ICH (intracerebral hemorrhage) ([place]) - R thalmic/PLIC d/t HTN 41/32/4401   Insomnia 07/13/2017   PAD (peripheral artery disease) ([place]) 07/13/2017   Hyperlipidemia with target LDL less than 70 07/13/2017   Stroke-like symptom 09/22/2016   Paresthesia 09/22/2016   CVA (cerebral vascular accident) ([place]) 09/22/2016   Cerebrovascular accident (CVA) due to thrombosis of left carotid artery ([place]) 09/23/2015   Primary snoring 09/23/2015   Hypersomnia with sleep apnea 09/23/2015   Obstructive sleep apnea 09/23/2015   Lacunar infarct, acute ([place]) 08/25/2015   Sleep apnea 08/25/2015   Dysarthria    CVD (cardiovascular disease) 08/02/2015   Acute hyperglycemia 02/72/5366   Systolic hypertension with cerebrovascular disease 12/27/2014   Epistaxis 07/28/2012   HYPERGLYCEMIA 10/02/2010   NEPHROLITHIASIS 05/23/2009   BARRETTS ESOPHAGUS 02/20/2009   Gastroparesis 02/20/2009   RADICULOPATHY 10/21/2008   GERD 10/08/2008   ESOPHAGITIS 08/15/2008   ESOPHAGEAL STRICTURE 08/15/2008   GASTRITIS, ACUTE 08/15/2008   RENAL CYST 08/14/2008   TOBACCO ABUSE 08/08/2008   HEMATURIA, MICROSCOPIC, HX OF 08/08/2008   PULMONARY NODULE 01/10/2008   Richard Vaughn, Richard Vaughn 11/10/21 11:09 AM   [facility] @ [place], [place], [address] Phone: [phone]   Fax:  [phone]  Name: Richard Vaughn MRN: [id number] Date of Birth: 1951-03-10

## 2021-11-17 ENCOUNTER — Other Ambulatory Visit: Payer: Self-pay

## 2021-11-17 ENCOUNTER — Ambulatory Visit: Payer: Medicare Other | Attending: Family Medicine

## 2021-11-17 DIAGNOSIS — I69354 Hemiplegia and hemiparesis following cerebral infarction affecting left non-dominant side: Secondary | ICD-10-CM | POA: Insufficient documentation

## 2021-11-17 DIAGNOSIS — R2681 Unsteadiness on feet: Secondary | ICD-10-CM | POA: Diagnosis not present

## 2021-11-17 DIAGNOSIS — M6281 Muscle weakness (generalized): Secondary | ICD-10-CM | POA: Insufficient documentation

## 2021-11-17 DIAGNOSIS — R296 Repeated falls: Secondary | ICD-10-CM | POA: Diagnosis not present

## 2021-11-17 DIAGNOSIS — R2689 Other abnormalities of gait and mobility: Secondary | ICD-10-CM | POA: Diagnosis not present

## 2021-11-17 NOTE — Therapy (Signed)
[facility] @ [place], [place], [address] Phone: [phone]   Fax:  [phone]  Physical Therapy Treatment  Patient Details  Name: Richard Vaughn MRN: [id number] Date of Birth: 05/05/1951 Referring Provider (PT): Betty Martinique, MD   Encounter Date: 11/17/2021   PT End of Session - 11/17/21 1347     Visit Number 4    Date for PT Re-Evaluation 12/29/21    Authorization Type Medicare    Progress Note Due on Visit 10    PT Start Time 1234    PT Stop Time 1315    PT Time Calculation (min) 41 min    Activity Tolerance Patient tolerated treatment well    Behavior During Therapy [facility] for tasks assessed/performed             Past Medical History:  Diagnosis Date   Barrett's esophagus    Chronic back pain    Depression    Gastritis    Mild   Gastroparesis    GERD (gastroesophageal reflux disease)    Headache    History of kidney stones    Hyperglycemia    Hypertension    Nephrolithiasis    Pneumonia    Covid pneumonia   Renal cyst    Stricture and stenosis of esophagus    Stroke [facility]) 07-2015   Vision abnormalities     Past Surgical History:  Procedure Laterality Date   CERVICAL [place] SURGERY  2012   COLONOSCOPY     LUMBAR [place] SURGERY  2005, 2010   replaced L4 and L5   SHOULDER ARTHROSCOPY WITH ROTATOR CUFF REPAIR Left 08/14/2020   Procedure: SHOULDER ARTHROSCOPY WITH ROTATOR CUFF REPAIR, SUBACROMIAL DECOMPRESSION;  Surgeon: Tania Ade, MD;  Location: [facility];  Service: Orthopedics;  Laterality: Left;   UPPER GASTROINTESTINAL ENDOSCOPY      There were no vitals filed for this visit.   Subjective Assessment - 11/17/21 1238     Subjective I have been exercising at home with my ankle weights.    Currently in Pain? No/denies                               [facility] Adult PT Treatment/Exercise - 11/17/21 0001       Ambulation/Gait   Ambulation/Gait Yes     Ambulation/Gait Assistance 6: Modified independent (Device/Increase time)    Ambulation Distance (Feet) 150 Feet    Assistive device Straight cane    Gait Pattern Step-through pattern;Decreased arm swing - left;Poor foot clearance - left    Ambulation Surface Level      Knee/Hip Exercises: Aerobic   Nustep Level 3 , seat and arms 13, x 8 min   PT present to monitor for fatigue     Knee/Hip Exercises: Standing   Hip Abduction Stengthening;Left;20 reps;2 sets;Knee straight    Abduction Limitations red band    Hip Extension Stengthening;Both;2 sets;10 reps;Knee straight    Extension Limitations red band    Other Standing Knee Exercises step over low cone x 10  leading with left foot.  Forward/reverse and side step over Rt and Lt. in parallel bars      Knee/Hip Exercises: Seated   Sit to Sand 10 reps;without UE support   pad in seat                      PT Short Term Goals - 11/17/21 1239  PT SHORT TERM GOAL #1   Title Patient to be indepdent with initial HEP    Baseline --    Status Achieved      PT SHORT TERM GOAL #2   Title Patient will report no falls for 4 weeks    Baseline no falls reported    Status On-going      PT SHORT TERM GOAL #3   Title --      PT SHORT TERM GOAL #4   Title --      PT SHORT TERM GOAL #5   Title --               PT Long Term Goals - 11/03/21 1759       PT LONG TERM GOAL #1   Title Patient will be indepdnent with a full pool program they can carryover at the [facility]    Time 8    Period Weeks    Status New    Target Date 12/29/21      PT LONG TERM GOAL #2   Title TUG score to be 20 sec or less    Time 8    Period Weeks    Status New    Target Date 12/29/21      PT LONG TERM GOAL #3   Title 5 times sit to stand will be 18 sec or less    Time 8    Period Weeks    Status New    Target Date 12/29/21      PT LONG TERM GOAL #4   Title Patient will be able to ambulate with correct sequencing with spc safely for  100 feet    Time 8    Period Weeks    Status New    Target Date 12/29/21                   Plan - 11/17/21 1347     Clinical Impression Statement Pt continues to walk and exercise at home.  He did well with use of theraband for hip strength in standing and required verbal cues for alignment and eccentric control.   Pt performs sit to stand with min to moderate UE support with good control with pad in chair for elevated surface.  Pt requires stand by assist and min guard for safety with balance activities and verbal cues for weight shift Lt.  He would benefit from continuing skilled PT for left LE strengthening and balance training.    PT Frequency 2x / week    PT Duration 8 weeks    PT Treatment/Interventions ADLs/Self Care Home Management;Aquatic Therapy;Traction;Moist Heat;Iontophoresis 4mg /ml Dexamethasone;Electrical Stimulation;Cryotherapy;Ultrasound;DME Instruction;Gait training;Therapeutic exercise;Therapeutic activities;Functional mobility training;Stair training;Balance training;Neuromuscular re-education;Patient/family education;Manual techniques;Passive range of motion;Vestibular;Energy conservation;Dry needling    PT Next Visit Plan Nu step, Left LE strengthening, balance training, gait training for increased left step length    Consulted and Agree with Plan of Care Patient             Patient will benefit from skilled therapeutic intervention in order to improve the following deficits and impairments:  Abnormal gait, Decreased balance, Decreased endurance, Decreased mobility, Difficulty walking, Impaired sensation, Decreased range of motion, Decreased coordination, Decreased knowledge of use of DME, Decreased safety awareness, Decreased strength, Impaired UE functional use  Visit Diagnosis: Repeated falls  Muscle weakness (generalized)  Unsteadiness on feet     Problem List Patient Active Problem List   Diagnosis Date Noted   Depressive reaction  Sleep  disturbance    Urinary retention    Bradycardia    Essential hypertension    Temperature elevated    Left thyroid nodule    Treatment-emergent central sleep apnea 10/25/2018   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) 10/25/2018   Chronic pain syndrome    Chronic bilateral low back pain    Benign essential HTN    Tobacco abuse    Marijuana abuse    Hyperlipidemia    History of CVA with residual deficit    Dysphagia, post-stroke    ICH (intracerebral hemorrhage) ([place]) - R thalmic/PLIC d/t HTN 47/05/6150   Insomnia 07/13/2017   PAD (peripheral artery disease) ([place]) 07/13/2017   Hyperlipidemia with target LDL less than 70 07/13/2017   Stroke-like symptom 09/22/2016   Paresthesia 09/22/2016   CVA (cerebral vascular accident) ([place]) 09/22/2016   Cerebrovascular accident (CVA) due to thrombosis of left carotid artery ([place]) 09/23/2015   Primary snoring 09/23/2015   Hypersomnia with sleep apnea 09/23/2015   Obstructive sleep apnea 09/23/2015   Lacunar infarct, acute ([place]) 08/25/2015   Sleep apnea 08/25/2015   Dysarthria    CVD (cardiovascular disease) 08/02/2015   Acute hyperglycemia 83/43/7357   Systolic hypertension with cerebrovascular disease 12/27/2014   Epistaxis 07/28/2012   HYPERGLYCEMIA 10/02/2010   NEPHROLITHIASIS 05/23/2009   BARRETTS ESOPHAGUS 02/20/2009   Gastroparesis 02/20/2009   RADICULOPATHY 10/21/2008   GERD 10/08/2008   ESOPHAGITIS 08/15/2008   ESOPHAGEAL STRICTURE 08/15/2008   GASTRITIS, ACUTE 08/15/2008   RENAL CYST 08/14/2008   TOBACCO ABUSE 08/08/2008   HEMATURIA, MICROSCOPIC, HX OF 08/08/2008   PULMONARY NODULE 01/10/2008   Sigurd Sos, PT 11/17/21 1:50 PM  [facility] @ [place], [place], [address] Phone: [phone]   Fax:  [phone]  Name: Richard Vaughn MRN: [id number] Date of Birth: April 29, 1951

## 2021-11-18 ENCOUNTER — Encounter: Payer: Self-pay | Admitting: Podiatry

## 2021-11-18 ENCOUNTER — Ambulatory Visit (INDEPENDENT_AMBULATORY_CARE_PROVIDER_SITE_OTHER): Payer: Medicare Other | Admitting: Podiatry

## 2021-11-18 DIAGNOSIS — I639 Cerebral infarction, unspecified: Secondary | ICD-10-CM

## 2021-11-18 DIAGNOSIS — M79674 Pain in right toe(s): Secondary | ICD-10-CM

## 2021-11-18 DIAGNOSIS — I739 Peripheral vascular disease, unspecified: Secondary | ICD-10-CM | POA: Diagnosis not present

## 2021-11-18 DIAGNOSIS — M79675 Pain in left toe(s): Secondary | ICD-10-CM | POA: Diagnosis not present

## 2021-11-18 DIAGNOSIS — B351 Tinea unguium: Secondary | ICD-10-CM | POA: Diagnosis not present

## 2021-11-18 NOTE — Progress Notes (Signed)
This patient returns to my office for at risk foot care.  This patient requires this care by a professional since this patient will be at risk due to having  CVA, PAD and hyperglycemia. He presents to the office with his wife.  This patient is unable to cut nails himself since the patient cannot reach his nails.These nails are painful walking and wearing shoes.  This patient presents for at risk foot care today.  General Appearance  Alert, conversant and in no acute stress.  Vascular  Dorsalis pedis and posterior tibial  pulses are palpable  bilaterally.  Capillary return is within normal limits  bilaterally. Temperature is within normal limits  bilaterally.  Neurologic  Senn-Weinstein monofilament wire test within normal limits  bilaterally. Muscle power within normal limits bilaterally.  Nails Thick disfigured discolored nails with subungual debris  from hallux to fifth toes bilaterally. No evidence of bacterial infection or drainage bilaterally.  Orthopedic  No limitations of motion  feet .  No crepitus or effusions noted.  No bony pathology or digital deformities noted.  Skin  normotropic skin with no porokeratosis noted bilaterally.  No signs of infections or ulcers noted.     Onychomycosis  Pain in right toes  Pain in left toes  Consent was obtained for treatment procedures.   Mechanical debridement of nails 1-5  bilaterally performed with a nail nipper.  Filed with dremel without incident.    Return office visit    3 months                  Told patient to return for periodic foot care and evaluation due to potential at risk complications.   Gardiner Barefoot DPM

## 2021-11-19 ENCOUNTER — Ambulatory Visit: Payer: Medicare Other

## 2021-11-19 ENCOUNTER — Other Ambulatory Visit: Payer: Self-pay

## 2021-11-19 DIAGNOSIS — M6281 Muscle weakness (generalized): Secondary | ICD-10-CM | POA: Diagnosis not present

## 2021-11-19 DIAGNOSIS — R2681 Unsteadiness on feet: Secondary | ICD-10-CM

## 2021-11-19 DIAGNOSIS — R296 Repeated falls: Secondary | ICD-10-CM | POA: Diagnosis not present

## 2021-11-19 DIAGNOSIS — I69354 Hemiplegia and hemiparesis following cerebral infarction affecting left non-dominant side: Secondary | ICD-10-CM | POA: Diagnosis not present

## 2021-11-19 DIAGNOSIS — R2689 Other abnormalities of gait and mobility: Secondary | ICD-10-CM | POA: Diagnosis not present

## 2021-11-19 NOTE — Therapy (Signed)
[facility] @ [place], [place], [address] Phone: [phone]   Fax:  [phone]  Physical Therapy Treatment  Patient Details  Name: Richard Vaughn MRN: [id number] Date of Birth: February 11, 1951 Referring Provider (PT): Betty Martinique, MD   Encounter Date: 11/19/2021   PT End of Session - 11/19/21 1313     Visit Number 5    Date for PT Re-Evaluation 12/29/21    Authorization Type Medicare    Progress Note Due on Visit 10    PT Start Time 1232    PT Stop Time 0093    PT Time Calculation (min) 40 min    Activity Tolerance Patient tolerated treatment well    Behavior During Therapy [facility] for tasks assessed/performed             Past Medical History:  Diagnosis Date   Barrett's esophagus    Chronic back pain    Depression    Gastritis    Mild   Gastroparesis    GERD (gastroesophageal reflux disease)    Headache    History of kidney stones    Hyperglycemia    Hypertension    Nephrolithiasis    Pneumonia    Covid pneumonia   Renal cyst    Stricture and stenosis of esophagus    Stroke [facility]) 07-2015   Vision abnormalities     Past Surgical History:  Procedure Laterality Date   CERVICAL [place] SURGERY  2012   COLONOSCOPY     LUMBAR [place] SURGERY  2005, 2010   replaced L4 and L5   SHOULDER ARTHROSCOPY WITH ROTATOR CUFF REPAIR Left 08/14/2020   Procedure: SHOULDER ARTHROSCOPY WITH ROTATOR CUFF REPAIR, SUBACROMIAL DECOMPRESSION;  Surgeon: Tania Ade, MD;  Location: [facility];  Service: Orthopedics;  Laterality: Left;   UPPER GASTROINTESTINAL ENDOSCOPY      There were no vitals filed for this visit.   Subjective Assessment - 11/19/21 1243     Subjective I feel good after PT sessions.    Patient Stated Goals to reduce falls and fall risk and be able to get back to the gym.    Currently in Pain? No/denies                               [facility] Adult PT Treatment/Exercise - 11/19/21  0001       High Level Balance   High Level Balance Activities Other (comment)    High Level Balance Comments step tap to edge of treadmill 2x10      Lumbar Exercises: Aerobic   UBE (Upper Arm Bike) Level 2x 4 min (2/2)      Knee/Hip Exercises: Aerobic   Nustep Level 3 , seat and arms 13, x 8 min   PT present to monitor for fatigue     Knee/Hip Exercises: Standing   Hip Abduction Stengthening;Left;20 reps;2 sets;Knee straight    Abduction Limitations red band    Hip Extension Stengthening;Both;2 sets;10 reps;Knee straight    Extension Limitations red band    Other Standing Knee Exercises step over hurdle x 10  leading with left foot.  Forward/reverse and side step over Rt and Lt.      Knee/Hip Exercises: Seated   Sit to Sand 10 reps;without UE support   pad in seat                      PT Short  Term Goals - 11/17/21 1239       PT SHORT TERM GOAL #1   Title Patient to be indepdent with initial HEP    Baseline --    Status Achieved      PT SHORT TERM GOAL #2   Title Patient will report no falls for 4 weeks    Baseline no falls reported    Status On-going      PT SHORT TERM GOAL #3   Title --      PT SHORT TERM GOAL #4   Title --      PT SHORT TERM GOAL #5   Title --               PT Long Term Goals - 11/03/21 1759       PT LONG TERM GOAL #1   Title Patient will be indepdnent with a full pool program they can carryover at the [facility]    Time 8    Period Weeks    Status New    Target Date 12/29/21      PT LONG TERM GOAL #2   Title TUG score to be 20 sec or less    Time 8    Period Weeks    Status New    Target Date 12/29/21      PT LONG TERM GOAL #3   Title 5 times sit to stand will be 18 sec or less    Time 8    Period Weeks    Status New    Target Date 12/29/21      PT LONG TERM GOAL #4   Title Patient will be able to ambulate with correct sequencing with spc safely for 100 feet    Time 8    Period Weeks    Status New    Target  Date 12/29/21                   Plan - 11/19/21 1310     Clinical Impression Statement Pt continues to walk and exercise at home.  He did well with use of theraband for hip strength in standing  this week and required verbal cues for alignment and eccentric control.   Pt performs transitions with good control and safety.   Pt requires stand by assist and min guard for safety with balance activities and verbal cues for stability. Pt will continue to benefit from skilled PT to address balance, gait and strength as result of stroke.    PT Frequency 2x / week    PT Duration 8 weeks    PT Treatment/Interventions ADLs/Self Care Home Management;Aquatic Therapy;Traction;Moist Heat;Iontophoresis 4mg /ml Dexamethasone;Electrical Stimulation;Cryotherapy;Ultrasound;DME Instruction;Gait training;Therapeutic exercise;Therapeutic activities;Functional mobility training;Stair training;Balance training;Neuromuscular re-education;Patient/family education;Manual techniques;Passive range of motion;Vestibular;Energy conservation;Dry needling    PT Next Visit Plan Nu step, Left LE strengthening, balance training, gait training for increased left step length    Recommended Other Services initial cert is signed by MD    Consulted and Agree with Plan of Care Patient             Patient will benefit from skilled therapeutic intervention in order to improve the following deficits and impairments:  Abnormal gait, Decreased balance, Decreased endurance, Decreased mobility, Difficulty walking, Impaired sensation, Decreased range of motion, Decreased coordination, Decreased knowledge of use of DME, Decreased safety awareness, Decreased strength, Impaired UE functional use  Visit Diagnosis: Repeated falls  Muscle weakness (generalized)  Unsteadiness on feet     Problem List Patient  Active Problem List   Diagnosis Date Noted   Depressive reaction    Sleep disturbance    Urinary retention    Bradycardia     Essential hypertension    Temperature elevated    Left thyroid nodule    Treatment-emergent central sleep apnea 10/25/2018   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) 10/25/2018   Chronic pain syndrome    Chronic bilateral low back pain    Benign essential HTN    Tobacco abuse    Marijuana abuse    Hyperlipidemia    History of CVA with residual deficit    Dysphagia, post-stroke    ICH (intracerebral hemorrhage) ([place]) - R thalmic/PLIC d/t HTN 01/65/5374   Insomnia 07/13/2017   PAD (peripheral artery disease) ([place]) 07/13/2017   Hyperlipidemia with target LDL less than 70 07/13/2017   Stroke-like symptom 09/22/2016   Paresthesia 09/22/2016   CVA (cerebral vascular accident) ([place]) 09/22/2016   Cerebrovascular accident (CVA) due to thrombosis of left carotid artery ([place]) 09/23/2015   Primary snoring 09/23/2015   Hypersomnia with sleep apnea 09/23/2015   Obstructive sleep apnea 09/23/2015   Lacunar infarct, acute ([place]) 08/25/2015   Sleep apnea 08/25/2015   Dysarthria    CVD (cardiovascular disease) 08/02/2015   Acute hyperglycemia 82/70/7867   Systolic hypertension with cerebrovascular disease 12/27/2014   Epistaxis 07/28/2012   HYPERGLYCEMIA 10/02/2010   NEPHROLITHIASIS 05/23/2009   BARRETTS ESOPHAGUS 02/20/2009   Gastroparesis 02/20/2009   RADICULOPATHY 10/21/2008   GERD 10/08/2008   ESOPHAGITIS 08/15/2008   ESOPHAGEAL STRICTURE 08/15/2008   GASTRITIS, ACUTE 08/15/2008   RENAL CYST 08/14/2008   TOBACCO ABUSE 08/08/2008   HEMATURIA, MICROSCOPIC, HX OF 08/08/2008   PULMONARY NODULE 01/10/2008   Sigurd Sos, PT 11/19/21 1:14 PM   [facility] @ [address], [place], [address] Phone: [phone]   Fax:  [phone]  Name: Richard Vaughn MRN: [id number] Date of Birth: 1950-12-02

## 2021-11-24 ENCOUNTER — Ambulatory Visit: Payer: Medicare Other

## 2021-11-24 ENCOUNTER — Other Ambulatory Visit: Payer: Self-pay

## 2021-11-24 DIAGNOSIS — R2681 Unsteadiness on feet: Secondary | ICD-10-CM

## 2021-11-24 DIAGNOSIS — R296 Repeated falls: Secondary | ICD-10-CM | POA: Diagnosis not present

## 2021-11-24 DIAGNOSIS — I69354 Hemiplegia and hemiparesis following cerebral infarction affecting left non-dominant side: Secondary | ICD-10-CM | POA: Diagnosis not present

## 2021-11-24 DIAGNOSIS — R2689 Other abnormalities of gait and mobility: Secondary | ICD-10-CM

## 2021-11-24 DIAGNOSIS — M6281 Muscle weakness (generalized): Secondary | ICD-10-CM

## 2021-11-24 NOTE — Therapy (Signed)
[facility] @ [place], [place], [address] Phone: [phone]   Fax:  [phone]  Physical Therapy Treatment  Patient Details  Name: Richard Vaughn MRN: [id number] Date of Birth: 04/01/1951 Referring Provider (PT): Betty Martinique, MD   Encounter Date: 11/24/2021   PT End of Session - 11/24/21 1329     Visit Number 6    Date for PT Re-Evaluation 12/29/21    Authorization Type Medicare    Progress Note Due on Visit 10    PT Start Time 1235    PT Stop Time 1319    PT Time Calculation (min) 44 min    Activity Tolerance Patient tolerated treatment well    Behavior During Therapy [facility] for tasks assessed/performed             Past Medical History:  Diagnosis Date   Barrett's esophagus    Chronic back pain    Depression    Gastritis    Mild   Gastroparesis    GERD (gastroesophageal reflux disease)    Headache    History of kidney stones    Hyperglycemia    Hypertension    Nephrolithiasis    Pneumonia    Covid pneumonia   Renal cyst    Stricture and stenosis of esophagus    Stroke [facility]) 07-2015   Vision abnormalities     Past Surgical History:  Procedure Laterality Date   CERVICAL [place] SURGERY  2012   COLONOSCOPY     LUMBAR [place] SURGERY  2005, 2010   replaced L4 and L5   SHOULDER ARTHROSCOPY WITH ROTATOR CUFF REPAIR Left 08/14/2020   Procedure: SHOULDER ARTHROSCOPY WITH ROTATOR CUFF REPAIR, SUBACROMIAL DECOMPRESSION;  Surgeon: Tania Ade, MD;  Location: [facility];  Service: Orthopedics;  Laterality: Left;   UPPER GASTROINTESTINAL ENDOSCOPY      There were no vitals filed for this visit.   Subjective Assessment - 11/24/21 1245     Subjective Patient states he is doing well.  No falls or issues since last visit.  He states his pain is "just normal pain".  We discuss his goal of getting back to the gym.  He asks if he will be able to do free weights.    Limitations Walking    How long can you  walk comfortably? 15 min    Diagnostic tests none recent    Patient Stated Goals to reduce falls and fall risk and be able to get back to the gym.    Pain Score 0-No pain                               [facility] Adult PT Treatment/Exercise - 11/24/21 0001       Exercises   Exercises Shoulder      Knee/Hip Exercises: Standing   Walking with Sports Cord retro walking with 10 lb using belt x 5 laps      Shoulder Exercises: Supine   Other Supine Exercises chest press with cane and 4 lb ankle weight x 20      Shoulder Exercises: Seated   Other Seated Exercises bicep curls x 20 right hand 6lb, left 3 lbs      Shoulder Exercises: ROM/Strengthening   UBE (Upper Arm Bike) Level 1 x 5 min      Shoulder Exercises: Power [employer] 20 reps    Row Limitations 40 lbs  Other Power Engineer, water Lat pull down x 10 25 lb    Other Power [employer] press 25 lb x 20                     PT Education - 11/24/21 1328     Education Details Educated patient and wife on safely navigating the gym and wife to assist with sit to stand as some machines will be low to floor.    Person(s) Educated Patient;Spouse    Methods Explanation;Demonstration    Comprehension Verbalized understanding;Returned demonstration              PT Short Term Goals - 11/17/21 1239       PT SHORT TERM GOAL #1   Title Patient to be indepdent with initial HEP    Baseline --    Status Achieved      PT SHORT TERM GOAL #2   Title Patient will report no falls for 4 weeks    Baseline no falls reported    Status On-going      PT SHORT TERM GOAL #3   Title --      PT SHORT TERM GOAL #4   Title --      PT SHORT TERM GOAL #5   Title --               PT Long Term Goals - 11/03/21 1759       PT LONG TERM GOAL #1   Title Patient will be indepdnent with a full pool program they can carryover at [facility]    Time 8    Period Weeks    Status New    Target Date  12/29/21      PT LONG TERM GOAL #2   Title TUG score to be 20 sec or less    Time 8    Period Weeks    Status New    Target Date 12/29/21      PT LONG TERM GOAL #3   Title 5 times sit to stand will be 18 sec or less    Time 8    Period Weeks    Status New    Target Date 12/29/21      PT LONG TERM GOAL #4   Title Patient will be able to ambulate with correct sequencing with spc safely for 100 feet    Time 8    Period Weeks    Status New    Target Date 12/29/21                   Plan - 11/24/21 1329     Clinical Impression Statement Patient needed verbal cues for increased step length on left.  He was able to tolerate addition of upper body strengthening but did fatigue easily.  He is well motivated and compliant and will likely return to his local gym once plan of care is completed.  He would benefit from balance and left LE strengthening along with gait training in a skilled PT environment for several visits to improve balance and reduce fall risk.    Personal Factors and Comorbidities Comorbidity 1;Comorbidity 2    Comorbidities Hx CVA and chronic low back pain    Examination-Activity Limitations Bathing;Stand;Stairs    Examination-Participation Restrictions Driving;Community Activity    Stability/Clinical Decision Making Evolving/Moderate complexity    Clinical Decision Making Moderate    Rehab Potential Good    PT Frequency 2x / week  PT Duration 8 weeks    PT Treatment/Interventions ADLs/Self Care Home Management;Aquatic Therapy;Traction;Moist Heat;Iontophoresis 4mg /ml Dexamethasone;Electrical Stimulation;Cryotherapy;Ultrasound;DME Instruction;Gait training;Therapeutic exercise;Therapeutic activities;Functional mobility training;Stair training;Balance training;Neuromuscular re-education;Patient/family education;Manual techniques;Passive range of motion;Vestibular;Energy conservation;Dry needling    PT Next Visit Plan Continue balance and gait training.  Patient  wanted to explore what he could do safely at the gym for upper body as well.  He may benefit from a review of these activities.    PT Home Exercise Plan Access Code: [email or web address]  URL: [email or web address]  Date: 11/05/2021  Prepared by: Candyce Churn    Exercises  Sit to Stand - 1 x daily - 7 x weekly - 1 sets - 5 reps  Sit to Stand Without Arm Support - 1 x daily - 7 x weekly - 1 sets - 5 reps  Seated Toe Raise - 1 x daily - 7 x weekly - 1 sets - 20 reps  Seated Long Arc Quad - 1 x daily - 7 x weekly - 1 sets - 20 reps  Seated March - 1 x daily - 7 x weekly - 1 sets - 20 reps    Consulted and Agree with Plan of Care Patient             Patient will benefit from skilled therapeutic intervention in order to improve the following deficits and impairments:  Abnormal gait, Decreased balance, Decreased endurance, Decreased mobility, Difficulty walking, Impaired sensation, Decreased range of motion, Decreased coordination, Decreased knowledge of use of DME, Decreased safety awareness, Decreased strength, Impaired UE functional use  Visit Diagnosis: Repeated falls  Muscle weakness (generalized)  Unsteadiness on feet  Hemiplegia and hemiparesis following cerebral infarction affecting left non-dominant side (HCC)  Other abnormalities of gait and mobility     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) 10/25/2018   Chronic pain syndrome    Chronic bilateral low back pain    Benign essential HTN    Tobacco abuse    Marijuana abuse    Hyperlipidemia    History of CVA with residual deficit    Dysphagia, post-stroke    ICH (intracerebral hemorrhage) (HCC) - R thalmic/PLIC d/t HTN  46/96/2952   Insomnia 07/13/2017   PAD (peripheral artery disease) ([place]) 07/13/2017   Hyperlipidemia with target LDL less than 70 07/13/2017   Stroke-like symptom 09/22/2016   Paresthesia 09/22/2016   CVA (cerebral vascular accident) ([place]) 09/22/2016   Cerebrovascular accident (CVA) due to thrombosis of left carotid artery ([place]) 09/23/2015   Primary snoring 09/23/2015   Hypersomnia with sleep apnea 09/23/2015   Obstructive sleep apnea 09/23/2015   Lacunar infarct, acute ([place]) 08/25/2015   Sleep apnea 08/25/2015   Dysarthria    CVD (cardiovascular disease) 08/02/2015   Acute hyperglycemia 84/13/2440   Systolic hypertension with cerebrovascular disease 12/27/2014   Epistaxis 07/28/2012   HYPERGLYCEMIA 10/02/2010   NEPHROLITHIASIS 05/23/2009   BARRETTS ESOPHAGUS 02/20/2009   Gastroparesis 02/20/2009   RADICULOPATHY 10/21/2008   GERD 10/08/2008   ESOPHAGITIS 08/15/2008   ESOPHAGEAL STRICTURE 08/15/2008   GASTRITIS, ACUTE 08/15/2008   RENAL CYST 08/14/2008   TOBACCO ABUSE 08/08/2008   HEMATURIA, MICROSCOPIC, HX OF 08/08/2008   PULMONARY NODULE 01/10/2008  Anderson Malta B. Marl Seago, PT 01/10/234:23 PM   [facility] @ [place], [place], [address] Phone: [phone]   Fax:  [phone]  Name: Richard Vaughn MRN: [id number] Date of Birth: [birth year]

## 2021-11-26 ENCOUNTER — Ambulatory Visit: Payer: Medicare Other

## 2021-11-26 ENCOUNTER — Other Ambulatory Visit: Payer: Self-pay

## 2021-11-26 DIAGNOSIS — R2681 Unsteadiness on feet: Secondary | ICD-10-CM

## 2021-11-26 DIAGNOSIS — I69354 Hemiplegia and hemiparesis following cerebral infarction affecting left non-dominant side: Secondary | ICD-10-CM | POA: Diagnosis not present

## 2021-11-26 DIAGNOSIS — R296 Repeated falls: Secondary | ICD-10-CM

## 2021-11-26 DIAGNOSIS — M6281 Muscle weakness (generalized): Secondary | ICD-10-CM

## 2021-11-26 DIAGNOSIS — R2689 Other abnormalities of gait and mobility: Secondary | ICD-10-CM | POA: Diagnosis not present

## 2021-11-26 NOTE — Therapy (Signed)
[facility] @ [address] [place], [place], [address] Phone: [phone]   Fax:  [phone]  Physical Therapy Treatment  Patient Details  Name: Richard Vaughn MRN: [id number] Date of Birth: [birth year] Referring Provider (PT): Betty Martinique, MD   Encounter Date: 11/26/2021   PT End of Session - 11/26/21 1336     Visit Number 7    Date for PT Re-Evaluation 12/29/21    Authorization Type Medicare    Progress Note Due on Visit 10    PT Start Time 1232    PT Stop Time 1319    PT Time Calculation (min) 47 min    Activity Tolerance Patient tolerated treatment well    Behavior During Therapy [facility] for tasks assessed/performed             Past Medical History:  Diagnosis Date   Barrett's esophagus    Chronic back pain    Depression    Gastritis    Mild   Gastroparesis    GERD (gastroesophageal reflux disease)    Headache    History of kidney stones    Hyperglycemia    Hypertension    Nephrolithiasis    Pneumonia    Covid pneumonia   Renal cyst    Stricture and stenosis of esophagus    Stroke [facility]) 07-2015   Vision abnormalities     Past Surgical History:  Procedure Laterality Date   CERVICAL [place] SURGERY  2012   COLONOSCOPY     LUMBAR [place] SURGERY  2005, 2010   replaced L4 and L5   SHOULDER ARTHROSCOPY WITH ROTATOR CUFF REPAIR Left 08/14/2020   Procedure: SHOULDER ARTHROSCOPY WITH ROTATOR CUFF REPAIR, SUBACROMIAL DECOMPRESSION;  Surgeon: Tania Ade, MD;  Location: [facility];  Service: Orthopedics;  Laterality: Left;   UPPER GASTROINTESTINAL ENDOSCOPY      There were no vitals filed for this visit.   Subjective Assessment - 11/26/21 1319     Subjective Patient is alert and oriented and in no acute distress. He denies any pain.  No falls.  States he felt like he couldn't balance good yesterday.  He explains that he and his wife went for a walk and that he felt unsteady.    Limitations Walking     Diagnostic tests none recent    Patient Stated Goals to reduce falls and fall risk and be able to get back to the gym.    Currently in Pain? No/denies                               [facility] Adult PT Treatment/Exercise - 11/26/21 0001       Lumbar Exercises: Aerobic   UBE (Upper Arm Bike) Level 2x 4 min (2/2)      Knee/Hip Exercises: Machines for Strengthening   Cybex Leg Press 100 # x 20 bilateral    Hip Cybex 55 lb hip flex, extension, and abduction x 20 each      Knee/Hip Exercises: Standing   Walking with Sports Cord retro walking with 10 lb using belt x 5 laps      Shoulder Exercises: Power [employer] 20 reps    Row Limitations 40 lbs    Other Power [employer] Exercises Lat pull down x 10 25 lb    Other Power Hydrologist press 25 lb x 20  PT Education - 11/26/21 1334     Education Details Educated patient on why his balance is compromised and how neuromuscular deficits contribute to this.    Person(s) Educated Patient;Spouse    Methods Explanation    Comprehension Verbalized understanding              PT Short Term Goals - 11/17/21 1239       PT SHORT TERM GOAL #1   Title Patient to be indepdent with initial HEP    Baseline --    Status Achieved      PT SHORT TERM GOAL #2   Title Patient will report no falls for 4 weeks    Baseline no falls reported    Status On-going      PT SHORT TERM GOAL #3   Title --      PT SHORT TERM GOAL #4   Title --      PT SHORT TERM GOAL #5   Title --               PT Long Term Goals - 11/03/21 1759       PT LONG TERM GOAL #1   Title Patient will be indepdnent with a full pool program they can carryover at the [facility]    Time 8    Period Weeks    Status New    Target Date 12/29/21      PT LONG TERM GOAL #2   Title TUG score to be 20 sec or less    Time 8    Period Weeks    Status New    Target Date 12/29/21      PT LONG TERM GOAL #3   Title 5 times  sit to stand will be 18 sec or less    Time 8    Period Weeks    Status New    Target Date 12/29/21      PT LONG TERM GOAL #4   Title Patient will be able to ambulate with correct sequencing with spc safely for 100 feet    Time 8    Period Weeks    Status New    Target Date 12/29/21                   Plan - 11/26/21 1337     Clinical Impression Statement Patient is showing good tolerance to resistance machines in gym.  Wife seems to be able to assist safely.  He would benefit from continued skilled PT to address strength and balance but he should be able to transition to his local gym once POC is completed.    Personal Factors and Comorbidities Comorbidity 1;Comorbidity 2    Comorbidities Hx CVA and chronic low back pain    Examination-Activity Limitations Bathing;Stand;Stairs    Examination-Participation Restrictions Driving;Community Activity    Stability/Clinical Decision Making Evolving/Moderate complexity    Clinical Decision Making Moderate    Rehab Potential Good    PT Frequency 2x / week    PT Duration 8 weeks    PT Treatment/Interventions ADLs/Self Care Home Management;Aquatic Therapy;Traction;Moist Heat;Iontophoresis 4mg /ml Dexamethasone;Electrical Stimulation;Cryotherapy;Ultrasound;DME Instruction;Gait training;Therapeutic exercise;Therapeutic activities;Functional mobility training;Stair training;Balance training;Neuromuscular re-education;Patient/family education;Manual techniques;Passive range of motion;Vestibular;Energy conservation;Dry needling    PT Next Visit Plan Continue balance and gait training.  Patient wanted to explore what he could do safely at the gym for upper and lower body.  He may benefit from a review of these activities.    PT Home Exercise Plan  Access Code: [email or web address]  URL: [email or web address]  Date: 11/05/2021  Prepared by: Candyce Churn    Exercises  Sit to Stand - 1 x daily - 7 x weekly - 1 sets - 5 reps  Sit to Stand Without  Arm Support - 1 x daily - 7 x weekly - 1 sets - 5 reps  Seated Toe Raise - 1 x daily - 7 x weekly - 1 sets - 20 reps  Seated Long Arc Quad - 1 x daily - 7 x weekly - 1 sets - 20 reps  Seated March - 1 x daily - 7 x weekly - 1 sets - 20 reps    Consulted and Agree with Plan of Care Patient             Patient will benefit from skilled therapeutic intervention in order to improve the following deficits and impairments:  Abnormal gait, Decreased balance, Decreased endurance, Decreased mobility, Difficulty walking, Impaired sensation, Decreased range of motion, Decreased coordination, Decreased knowledge of use of DME, Decreased safety awareness, Decreased strength, Impaired UE functional use  Visit Diagnosis: Repeated falls  Muscle weakness (generalized)  Unsteadiness on feet  Hemiplegia and hemiparesis following cerebral infarction affecting left non-dominant side [facility])     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) 10/25/2018   Chronic pain syndrome    Chronic bilateral low back pain    Benign essential HTN    Tobacco abuse    Marijuana abuse    Hyperlipidemia    History of CVA with residual deficit    Dysphagia, post-stroke    ICH (intracerebral hemorrhage) (HCC) - R thalmic/PLIC d/t HTN 83/15/1761   Insomnia 07/13/2017   PAD (peripheral artery disease) ([place]) 07/13/2017   Hyperlipidemia with target LDL less than 70 07/13/2017   Stroke-like symptom 09/22/2016   Paresthesia 09/22/2016   CVA (cerebral vascular accident) ([place]) 09/22/2016   Cerebrovascular accident (CVA) due to thrombosis of left carotid artery ([place]) 09/23/2015   Primary snoring 09/23/2015   Hypersomnia with sleep  apnea 09/23/2015   Obstructive sleep apnea 09/23/2015   Lacunar infarct, acute ([place]) 08/25/2015   Sleep apnea 08/25/2015   Dysarthria    CVD (cardiovascular disease) 08/02/2015   Acute hyperglycemia 60/73/7106   Systolic hypertension with cerebrovascular disease 12/27/2014   Epistaxis 07/28/2012   HYPERGLYCEMIA 10/02/2010   NEPHROLITHIASIS 05/23/2009   BARRETTS ESOPHAGUS 02/20/2009   Gastroparesis 02/20/2009   RADICULOPATHY 10/21/2008   GERD 10/08/2008   ESOPHAGITIS 08/15/2008   ESOPHAGEAL STRICTURE 08/15/2008   GASTRITIS, ACUTE 08/15/2008   RENAL CYST 08/14/2008   TOBACCO ABUSE 08/08/2008   HEMATURIA, MICROSCOPIC, HX OF 08/08/2008   PULMONARY NODULE 01/10/2008    Richard Vaughn, PT 01/12/231:45 PM   [facility] @ [place], [place], [address] Phone: [phone]   Fax:  [phone]  Name: Richard Vaughn MRN: [id number] Date of Birth: 08-Apr-1951

## 2021-11-26 NOTE — Patient Instructions (Addendum)
Instructed patient and wife in all gym activities today that could be simulated at the gym.  Patient was advised, also, to use rollator walker when he and his wife go out for walks to reduce fall risk and allow them to stay out longer and that this will encourage an improved gait pattern as well.

## 2021-12-01 ENCOUNTER — Other Ambulatory Visit: Payer: Self-pay

## 2021-12-01 ENCOUNTER — Ambulatory Visit: Payer: Medicare Other

## 2021-12-01 DIAGNOSIS — M6281 Muscle weakness (generalized): Secondary | ICD-10-CM

## 2021-12-01 DIAGNOSIS — R2681 Unsteadiness on feet: Secondary | ICD-10-CM

## 2021-12-01 DIAGNOSIS — I69354 Hemiplegia and hemiparesis following cerebral infarction affecting left non-dominant side: Secondary | ICD-10-CM | POA: Diagnosis not present

## 2021-12-01 DIAGNOSIS — R296 Repeated falls: Secondary | ICD-10-CM

## 2021-12-01 DIAGNOSIS — R2689 Other abnormalities of gait and mobility: Secondary | ICD-10-CM | POA: Diagnosis not present

## 2021-12-01 NOTE — Patient Instructions (Signed)
Suggested to patient and wife to use office chair to do hamstring and quad push pull on a hard floor surface.

## 2021-12-01 NOTE — Therapy (Signed)
[facility] @ [place], [place], [address] Phone: [phone]   Fax:  [phone]  Physical Therapy Treatment  Patient Details  Name: Richard Vaughn MRN: [id number] Date of Birth: 1951/01/17 Referring Provider (PT): Betty Martinique, MD   Encounter Date: 12/01/2021   PT End of Session - 12/01/21 1322     Visit Number 8    Date for PT Re-Evaluation 12/29/21    Authorization Type Medicare    Progress Note Due on Visit 10    PT Start Time 2094    PT Stop Time 1315    PT Time Calculation (min) 45 min    Activity Tolerance Patient tolerated treatment well    Behavior During Therapy [facility] for tasks assessed/performed             Past Medical History:  Diagnosis Date   Barrett's esophagus    Chronic back pain    Depression    Gastritis    Mild   Gastroparesis    GERD (gastroesophageal reflux disease)    Headache    History of kidney stones    Hyperglycemia    Hypertension    Nephrolithiasis    Pneumonia    Covid pneumonia   Renal cyst    Stricture and stenosis of esophagus    Stroke [facility]) 07-2015   Vision abnormalities     Past Surgical History:  Procedure Laterality Date   CERVICAL [place] SURGERY  2012   COLONOSCOPY     LUMBAR [place] SURGERY  2005, 2010   replaced L4 and L5   SHOULDER ARTHROSCOPY WITH ROTATOR CUFF REPAIR Left 08/14/2020   Procedure: SHOULDER ARTHROSCOPY WITH ROTATOR CUFF REPAIR, SUBACROMIAL DECOMPRESSION;  Surgeon: Tania Ade, MD;  Location[facility];  Service: Orthopedics;  Laterality: Left;   UPPER GASTROINTESTINAL ENDOSCOPY      There were no vitals filed for this visit.   Subjective Assessment - 12/01/21 1237     Subjective Patient arrives stating "just one of those days".  Wife explains, in private, that his brother died and that he may seem a little down.    Limitations Walking    How long can you walk comfortably? 15 min    Diagnostic tests none recent    Patient  Stated Goals to reduce falls and fall risk and be able to get back to the gym.    Currently in Pain? No/denies                               [facility] Adult PT Treatment/Exercise - 12/01/21 0001       Knee/Hip Exercises: Aerobic   Nustep Level 3 , seat and arms 13, x 5 min   PT present to monitor for fatigue     Knee/Hip Exercises: Machines for Strengthening   Cybex Leg Press 90 # x 20 bilateral; 50 lb singles (left)      Knee/Hip Exercises: Standing   Walking with Sports Cord retro walking with 10 lb using belt x 5 laps    Other Standing Knee Exercises Alternating tap ups on steps in corner      Knee/Hip Exercises: Seated   Long Arc Quad Strengthening;Left;2 sets;10 reps;Weights    Long Arc Quad Weight 5 lbs.    Stool Scoot - Round Trips 8 ft distance; left LE pull and push x 5 laps    Knee/Hip Flexion 5 lb 2 x 10  Shoulder Exercises: ROM/Strengthening   UBE (Upper Arm Bike) Level 1 x 5 min   2.5 min fwd, 2.5 min reverse                      PT Short Term Goals - 11/17/21 1239       PT SHORT TERM GOAL #1   Title Patient to be indepdent with initial HEP    Baseline --    Status Achieved      PT SHORT TERM GOAL #2   Title Patient will report no falls for 4 weeks    Baseline no falls reported    Status On-going      PT SHORT TERM GOAL #3   Title --      PT SHORT TERM GOAL #4   Title --      PT SHORT TERM GOAL #5   Title --               PT Long Term Goals - 11/03/21 1759       PT LONG TERM GOAL #1   Title Patient will be indepdnent with a full pool program they can carryover at the [facility]    Time 8    Period Weeks    Status New    Target Date 12/29/21      PT LONG TERM GOAL #2   Title TUG score to be 20 sec or less    Time 8    Period Weeks    Status New    Target Date 12/29/21      PT LONG TERM GOAL #3   Title 5 times sit to stand will be 18 sec or less    Time 8    Period Weeks    Status New    Target Date  12/29/21      PT LONG TERM GOAL #4   Title Patient will be able to ambulate with correct sequencing with spc safely for 100 feet    Time 8    Period Weeks    Status New    Target Date 12/29/21                   Plan - 12/01/21 1323     Clinical Impression Statement Patient was somewhat fatigued today.  His brother passed away yesterday and wife states he was fairly down and didnt sleep well.  He had more episodes of poor foot clearance on left today and seemed lethargic.  He completed all tasks, however.  He would benefit from continuing skilled PT for a few more visits to focus on balance and stability.  He and his wife are planning on resuming their gym activities once PT is completed.    Personal Factors and Comorbidities Comorbidity 1;Comorbidity 2    Comorbidities Hx CVA and chronic low back pain    Examination-Activity Limitations Bathing;Stand;Stairs    Examination-Participation Restrictions Driving;Community Activity    Stability/Clinical Decision Making Evolving/Moderate complexity    Clinical Decision Making Moderate    Rehab Potential Good    PT Frequency 2x / week    PT Duration 8 weeks    PT Treatment/Interventions ADLs/Self Care Home Management;Aquatic Therapy;Traction;Moist Heat;Iontophoresis 4mg /ml Dexamethasone;Electrical Stimulation;Cryotherapy;Ultrasound;DME Instruction;Gait training;Therapeutic exercise;Therapeutic activities;Functional mobility training;Stair training;Balance training;Neuromuscular re-education;Patient/family education;Manual techniques;Passive range of motion;Vestibular;Energy conservation;Dry needling    PT Next Visit Plan Continue balance and gait training.  Patient wanted to explore what he could do safely at the gym for upper and lower  body.  He may benefit from a review of these activities.    PT Home Exercise Plan Access Code: [email or web address]  URL: [email or web address]  Date: 11/05/2021  Prepared by: Candyce Churn    Exercises   Sit to Stand - 1 x daily - 7 x weekly - 1 sets - 5 reps  Sit to Stand Without Arm Support - 1 x daily - 7 x weekly - 1 sets - 5 reps  Seated Toe Raise - 1 x daily - 7 x weekly - 1 sets - 20 reps  Seated Long Arc Quad - 1 x daily - 7 x weekly - 1 sets - 20 reps  Seated March - 1 x daily - 7 x weekly - 1 sets - 20 reps             Patient will benefit from skilled therapeutic intervention in order to improve the following deficits and impairments:  Abnormal gait, Decreased balance, Decreased endurance, Decreased mobility, Difficulty walking, Impaired sensation, Decreased range of motion, Decreased coordination, Decreased knowledge of use of DME, Decreased safety awareness, Decreased strength, Impaired UE functional use  Visit Diagnosis: Repeated falls  Muscle weakness (generalized)  Unsteadiness on feet  Hemiplegia and hemiparesis following cerebral infarction affecting left non-dominant side [facility])     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) 10/25/2018   Chronic pain syndrome    Chronic bilateral low back pain    Benign essential HTN    Tobacco abuse    Marijuana abuse    Hyperlipidemia    History of CVA with residual deficit    Dysphagia, post-stroke    ICH (intracerebral hemorrhage) (HCC) - R thalmic/PLIC d/t HTN 84/16/6063   Insomnia 07/13/2017   PAD (peripheral artery disease) ([place]) 07/13/2017   Hyperlipidemia with target LDL less than 70 07/13/2017   Stroke-like symptom 09/22/2016   Paresthesia 09/22/2016   CVA (cerebral vascular accident) ([place]) 09/22/2016   Cerebrovascular accident (CVA) due to thrombosis of left carotid artery ([place]) 09/23/2015   Primary snoring 09/23/2015    Hypersomnia with sleep apnea 09/23/2015   Obstructive sleep apnea 09/23/2015   Lacunar infarct, acute ([place]) 08/25/2015   Sleep apnea 08/25/2015   Dysarthria    CVD (cardiovascular disease) 08/02/2015   Acute hyperglycemia 01/60/1093   Systolic hypertension with cerebrovascular disease 12/27/2014   Epistaxis 07/28/2012   HYPERGLYCEMIA 10/02/2010   NEPHROLITHIASIS 05/23/2009   BARRETTS ESOPHAGUS 02/20/2009   Gastroparesis 02/20/2009   RADICULOPATHY 10/21/2008   GERD 10/08/2008   ESOPHAGITIS 08/15/2008   ESOPHAGEAL STRICTURE 08/15/2008   GASTRITIS, ACUTE 08/15/2008   RENAL CYST 08/14/2008   TOBACCO ABUSE 08/08/2008   HEMATURIA, MICROSCOPIC, HX OF 08/08/2008   PULMONARY NODULE 01/10/2008    Loralie Malta B. Shaquira Moroz, PT 01/17/231:28 PM   [facility] @ [place], [place], [address] Phone: [phone]   Fax:  [phone]  Name: Richard Vaughn MRN: [id number] Date of Birth: [birth year]

## 2021-12-07 ENCOUNTER — Ambulatory Visit: Payer: Medicare Other | Admitting: Family Medicine

## 2021-12-08 ENCOUNTER — Ambulatory Visit: Payer: Medicare Other

## 2021-12-08 NOTE — Progress Notes (Signed)
HPI: [name] is a 71 y.o. male, who is here today to follow on recent visit. He was last seen on 08/05/21. No new problems since his last visit.  Insomnia, sleeping about 7 hours. He is on Trazodone 50 mg at bedtime. He falls asleep in his couch for 3-4 hours and goes to bed when he wakes up. His brother passed away a few weeks ago. States that he feels sad because this event but does not feel depressed.  Depression screen [facility] 2/9 12/09/2021 04/07/2021 12/09/2020 07/10/2018  Decreased Interest 2 1 0 0  Down, Depressed, Hopeless 1 3 0 0  PHQ - 2 Score 3 4 0 0  Altered sleeping 2 0 - -  Tired, decreased energy 2 1 - -  Change in appetite 1 3 - -  Feeling bad or failure about yourself  1 2 - -  Trouble concentrating 0 0 - -  Moving slowly or fidgety/restless 0 0 - -  Suicidal thoughts 1 1 - -  PHQ-9 Score 10 11 - -  Difficult doing work/chores Somewhat difficult Somewhat difficult - -  Some recent data might be hidden   Hypertension:  Medications: Stopped Olmesartan 20 mg daily because BP was low. He is on Amlodipine 2.5 mg daily. BP readings at home:120-130's/80's. Side effects:None Negative for unusual or severe headache, visual changes, exertional chest pain, dyspnea,  new focal weakness, or edema.  Lab Results  Component Value Date   CREATININE 0.95 08/05/2021   BUN 9 08/05/2021   NA 142 08/05/2021   K 3.6 08/05/2021   CL 106 08/05/2021   CO2 28 08/05/2021   HLD: He is on Lovastatin 20 mg  daily and low fat diet. No side effects noted.  Lab Results  Component Value Date   CHOL 139 12/09/2020   HDL 40.30 12/09/2020   LDLCALC 85 12/09/2020   TRIG 68.0 12/09/2020   CHOLHDL 3 12/09/2020   His wife ois asking is there is something he can take to decrease salivation. Sometimes he is drooling. He is not interested in treatments.  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 He wonders if lost function can be recovered. He is planning in  participating in a study at [facility] about brain stimulation to help with regaining some of function.  He is also doing PT. No falls since his last visit. He uses a cane for assistance.  Review of Systems  Constitutional:  Positive for fatigue. Negative for activity change, appetite change and fever.  HENT:  Negative for nosebleeds and trouble swallowing.   Respiratory:  Negative for cough and wheezing.   Gastrointestinal:  Negative for abdominal pain, nausea and vomiting.  Genitourinary:  Negative for decreased urine volume, dysuria and hematuria.  Musculoskeletal:  Positive for arthralgias and gait problem.  Neurological:  Negative for dizziness and syncope.  Rest see pertinent positives and negatives per HPI.  Current Outpatient Medications on File Prior to Visit  Medication Sig Dispense Refill   amLODipine (NORVASC) 2.5 MG tablet Take 1 tablet (2.5 mg total) by mouth daily. 90 tablet 3   aspirin EC 81 MG tablet Take 1 tablet (81 mg total) by mouth daily. Swallow whole. 30 tablet 11   esomeprazole (NEXIUM) 40 MG capsule Take 1 capsule (40 mg total) by mouth 2 (two) times daily before a meal. **PLEASE CONTACT THE OFFICE TO SCHEDULE APPOINTMENT 60 capsule 1   lovastatin (MEVACOR) 20 MG tablet Take 1 tablet (20 mg total) by mouth at bedtime.  90 tablet 1   oxymorphone (OPANA) 10 MG tablet Take 10 mg by mouth in the morning and at bedtime.     traZODone (DESYREL) 50 MG tablet TAKE 1 TABLET AT BEDTIME ASNEEDED FOR SLEEP 90 tablet 2   No current facility-administered medications on file prior to visit.    Past Medical History:  Diagnosis Date   Barrett's esophagus    Chronic back pain    Depression    Gastritis    Mild   Gastroparesis    GERD (gastroesophageal reflux disease)    Headache    History of kidney stones    Hyperglycemia    Hypertension    Nephrolithiasis    Pneumonia    Covid pneumonia   Renal cyst    Stricture and stenosis of esophagus    Stroke ([place]) 07-2015    Vision abnormalities    No Known Allergies  Social History   Socioeconomic History   Marital status: Married    Spouse name: Richard Vaughn   Number of children: 3   Years of education: Not on file   Highest education level: Not on file  Occupational History   Occupation: retired    Fish farm manager: [employer]  Tobacco Use   Smoking status: Former    Packs/day: 0.50    Years: 30.00    Pack years: 15.00    Types: Cigarettes    Quit date: 04/15/2018    Years since quitting: 3.6   Smokeless tobacco: Never  Vaping Use   Vaping Use: Never used  Substance and Sexual Activity   Alcohol use: No    Alcohol/week: 0.0 standard drinks   Drug use: Never    Comment: last use 04/2018   Sexual activity: Yes  Other Topics Concern   Not on file  Social History Narrative   Not on file   Social Determinants of Health   Financial Resource Strain: Not on file  Food Insecurity: Not on file  Transportation Needs: Not on file  Physical Activity: Not on file  Stress: Not on file  Social Connections: Not on file    Vitals:   12/09/21 1439  BP: 124/80  Pulse: 83  Resp: 16  SpO2: 97%   Body mass index is 30.3 kg/m.  Physical Exam Vitals and nursing note reviewed.  Constitutional:      General: He is not in acute distress.    Appearance: He is well-developed.  HENT:     Head: Normocephalic and atraumatic.     Mouth/Throat:     Mouth: Mucous membranes are moist.  Eyes:     Conjunctiva/sclera: Conjunctivae normal.  Cardiovascular:     Rate and Rhythm: Normal rate and regular rhythm.     Pulses:          Dorsalis pedis pulses are 2+ on the right side and 2+ on the left side.     Heart sounds: No murmur heard. Pulmonary:     Effort: Pulmonary effort is normal. No respiratory distress.     Breath sounds: Normal breath sounds.  Abdominal:     Palpations: Abdomen is soft. There is no hepatomegaly or mass.     Tenderness: There is no abdominal tenderness.  Lymphadenopathy:      Cervical: No cervical adenopathy.  Skin:    General: Skin is warm.     Findings: No erythema or rash.  Neurological:     Mental Status: He is alert and oriented to person, place, and time.  Comments: Unstable gait, assisted with a cane. Dysarthria. Lefe-sided weakness.  Psychiatric:        Mood and Affect: Mood normal.     Comments: Well groomed, good eye contact.   ASSESSMENT AND PLAN:  [name] was seen today for follow-up.  Diagnoses and all orders for this visit: Orders Placed This Encounter  Procedures   Comprehensive metabolic panel   Lipid panel   Lab Results  Component Value Date   CHOL 155 12/09/2021   HDL 43.00 12/09/2021   LDLCALC 94 12/09/2021   TRIG 88.0 12/09/2021   CHOLHDL 4 12/09/2021   Lab Results  Component Value Date   CREATININE 1.07 12/09/2021   BUN 11 12/09/2021   NA 140 12/09/2021   K 4.3 12/09/2021   CL 106 12/09/2021   CO2 27 12/09/2021   Lab Results  Component Value Date   ALT 14 12/09/2021   AST 14 12/09/2021   ALKPHOS 66 12/09/2021   BILITOT 0.8 12/09/2021   Insomnia, unspecified type Stressed the importance of good sleep hygiene. Continue Trazodone 50 mg daily.  Essential hypertension BP adequately controlled. Continue Amlodipine 2.5 mg daily. Continue low salt diet and monitoring BP regularly.  Hemiparesis affecting left side as late effect of cerebrovascular accident [facility]) PT is helping with balance and LE straight. In general after years of residual deficit from CVA the likelihood of recovering function is low, he is interested in participating in a neuro study when brain stimulation seems to help to regain function. We discussed the importance of aggressive management of risk factors fro secondary prevention.   Hyperlipidemia with target LDL less than 70 LDL has not been at goal, 85 in 11/2020. Continue Lovastatin 20 mg daily and low fat diet. Further recommendations according to lipid panel result.  Return in  about 6 months (around 06/08/2022).  Richard Vaughn G. Martinique, MD  [facility]. [address] office.

## 2021-12-09 ENCOUNTER — Ambulatory Visit (INDEPENDENT_AMBULATORY_CARE_PROVIDER_SITE_OTHER): Payer: Medicare Other | Admitting: Family Medicine

## 2021-12-09 ENCOUNTER — Encounter: Payer: Self-pay | Admitting: Family Medicine

## 2021-12-09 VITALS — BP 124/80 | HR 83 | Resp 16 | Ht 74.0 in | Wt 236.0 lb

## 2021-12-09 DIAGNOSIS — I69354 Hemiplegia and hemiparesis following cerebral infarction affecting left non-dominant side: Secondary | ICD-10-CM | POA: Diagnosis not present

## 2021-12-09 DIAGNOSIS — G4700 Insomnia, unspecified: Secondary | ICD-10-CM

## 2021-12-09 DIAGNOSIS — I639 Cerebral infarction, unspecified: Secondary | ICD-10-CM

## 2021-12-09 DIAGNOSIS — E785 Hyperlipidemia, unspecified: Secondary | ICD-10-CM

## 2021-12-09 DIAGNOSIS — I10 Essential (primary) hypertension: Secondary | ICD-10-CM | POA: Diagnosis not present

## 2021-12-09 NOTE — Patient Instructions (Addendum)
A few things to remember from today's visit:  Essential hypertension  Insomnia, unspecified type  Depressive reaction  Hemiparesis affecting left side as late effect of cerebrovascular accident [facility])  If you need refills please call your pharmacy. Do not use My Chart to request refills or for acute issues that need immediate attention.   No changes today. Continue PT and let me know if a new referral is needed. Fall precautions. Good sleep hygiene.  Please be sure medication list is accurate. If a new problem present, please set up appointment sooner than planned today.

## 2021-12-10 ENCOUNTER — Ambulatory Visit: Payer: Medicare Other

## 2021-12-10 ENCOUNTER — Other Ambulatory Visit: Payer: Self-pay

## 2021-12-10 ENCOUNTER — Other Ambulatory Visit: Payer: Self-pay | Admitting: Gastroenterology

## 2021-12-10 DIAGNOSIS — R2689 Other abnormalities of gait and mobility: Secondary | ICD-10-CM | POA: Diagnosis not present

## 2021-12-10 DIAGNOSIS — M6281 Muscle weakness (generalized): Secondary | ICD-10-CM

## 2021-12-10 DIAGNOSIS — R6881 Early satiety: Secondary | ICD-10-CM

## 2021-12-10 DIAGNOSIS — R296 Repeated falls: Secondary | ICD-10-CM

## 2021-12-10 DIAGNOSIS — I69354 Hemiplegia and hemiparesis following cerebral infarction affecting left non-dominant side: Secondary | ICD-10-CM

## 2021-12-10 DIAGNOSIS — R2681 Unsteadiness on feet: Secondary | ICD-10-CM | POA: Diagnosis not present

## 2021-12-10 LAB — COMPREHENSIVE METABOLIC PANEL
ALT: 14 U/L (ref 0–53)
AST: 14 U/L (ref 0–37)
Albumin: 4.3 g/dL (ref 3.5–5.2)
Alkaline Phosphatase: 66 U/L (ref 39–117)
BUN: 11 mg/dL (ref 6–23)
CO2: 27 mEq/L (ref 19–32)
Calcium: 9.2 mg/dL (ref 8.4–10.5)
Chloride: 106 mEq/L (ref 96–112)
Creatinine, Ser: 1.07 mg/dL (ref 0.40–1.50)
GFR: 71.5 mL/min (ref >60.00)
Glucose, Bld: 92 mg/dL (ref 70–99)
Potassium: 4.3 mEq/L (ref 3.5–5.1)
Sodium: 140 mEq/L (ref 135–145)
Total Bilirubin: 0.8 mg/dL (ref 0.2–1.2)
Total Protein: 7.1 g/dL (ref 6.0–8.3)

## 2021-12-10 LAB — LIPID PANEL
Cholesterol: 155 mg/dL (ref 0–200)
HDL: 43 mg/dL (ref >39.00)
LDL Cholesterol: 94 mg/dL (ref 0–99)
NonHDL: 111.79
Total CHOL/HDL Ratio: 4
Triglycerides: 88 mg/dL (ref 0.0–149.0)
VLDL: 17.6 mg/dL (ref 0.0–40.0)

## 2021-12-10 NOTE — Therapy (Signed)
[facility] [address] [place], [place], [address] Phone: [phone]   Fax:  [phone]  Physical Therapy Treatment  Patient Details  Name: Richard Vaughn MRN: [id number] Date of Birth: 1951/02/23 Referring Provider (PT): Betty Martinique, MD   Encounter Date: 12/10/2021   PT End of Session - 12/10/21 1247     Visit Number 9    Date for PT Re-Evaluation 12/29/21    Authorization Type Medicare    Progress Note Due on Visit 10    PT Start Time 1235    PT Stop Time 3790    PT Time Calculation (min) 30 min    Activity Tolerance Patient tolerated treatment well    Behavior During Therapy [facility] for tasks assessed/performed             Past Medical History:  Diagnosis Date   Barrett's esophagus    Chronic back pain    Depression    Gastritis    Mild   Gastroparesis    GERD (gastroesophageal reflux disease)    Headache    History of kidney stones    Hyperglycemia    Hypertension    Nephrolithiasis    Pneumonia    Covid pneumonia   Renal cyst    Stricture and stenosis of esophagus    Stroke [facility]) 07-2015   Vision abnormalities     Past Surgical History:  Procedure Laterality Date   CERVICAL [place] SURGERY  2012   COLONOSCOPY     LUMBAR [place] SURGERY  2005, 2010   replaced L4 and L5   SHOULDER ARTHROSCOPY WITH ROTATOR CUFF REPAIR Left 08/14/2020   Procedure: SHOULDER ARTHROSCOPY WITH ROTATOR CUFF REPAIR, SUBACROMIAL DECOMPRESSION;  Surgeon: Tania Ade, MD;  Location: [facility];  Service: Orthopedics;  Laterality: Left;   UPPER GASTROINTESTINAL ENDOSCOPY      There were no vitals filed for this visit.   Subjective Assessment - 12/10/21 1357     Subjective Patient states he is feeling a little tired.  Had his brothers funeral this week in [place].    Limitations Walking    How long can you walk comfortably? 15 min    Diagnostic tests none recent    Patient Stated Goals to reduce falls and fall risk and  be able to get back to the gym.    Currently in Pain? No/denies                               [facility] Adult PT Treatment/Exercise - 12/10/21 0001       High Level Balance   High Level Balance Activities Side stepping;Backward walking;Tandem walking    High Level Balance Comments x 3 laps each      Knee/Hip Exercises: Aerobic   Nustep Level 3 , seat and arms 13, x 5 min   PT present to monitor for fatigue     Knee/Hip Exercises: Standing   Hip Abduction Stengthening;Both;2 sets;10 reps    Hip Extension Stengthening;Both;2 sets;10 reps    Other Standing Knee Exercises Alternating tap ups on 6" step    Other Standing Knee Exercises LE band walks at counter      Ankle Exercises: Standing   Other Standing Ankle Exercises Up and over balance pad lateral x 10                       PT Short Term Goals -  11/17/21 1239       PT SHORT TERM GOAL #1   Title Patient to be indepdent with initial HEP    Baseline --    Status Achieved      PT SHORT TERM GOAL #2   Title Patient will report no falls for 4 weeks    Baseline no falls reported    Status On-going      PT SHORT TERM GOAL #3   Title --      PT SHORT TERM GOAL #4   Title --      PT SHORT TERM GOAL #5   Title --               PT Long Term Goals - 12/10/21 1321       PT LONG TERM GOAL #4   Title Patient will be able to ambulate with correct sequencing with spc safely for 100 feet    Baseline 70 degrees with discomfort    Time 8    Period Weeks    Status Achieved    Target Date 12/29/21      PT LONG TERM GOAL #5   Title Patient will have 5/5 strength throughlout lower extremity to ambulate around community safely.    Baseline 4+/5 left hip flexion and 4+/5 left hip abduction    Time 6    Period Weeks    Status On-going    Target Date 07/07/21                   Plan - 12/10/21 1317     Clinical Impression Statement Mr. Lucchesi was less fatigued today but did talk  about his brother a bit.  He was able to resume some higher level balance tasks with use of single UE support with min LOB.  He would benefit from continued skilled PT to focus on balance activity.    Personal Factors and Comorbidities Comorbidity 1;Comorbidity 2    Comorbidities Hx CVA and chronic low back pain    Examination-Activity Limitations Bathing;Stand;Stairs    Examination-Participation Restrictions Driving;Community Activity    Stability/Clinical Decision Making Evolving/Moderate complexity    Clinical Decision Making Moderate    Rehab Potential Good    PT Frequency 2x / week    PT Duration 8 weeks    PT Treatment/Interventions ADLs/Self Care Home Management;Aquatic Therapy;Traction;Moist Heat;Iontophoresis 4mg /ml Dexamethasone;Electrical Stimulation;Cryotherapy;Ultrasound;DME Instruction;Gait training;Therapeutic exercise;Therapeutic activities;Functional mobility training;Stair training;Balance training;Neuromuscular re-education;Patient/family education;Manual techniques;Passive range of motion;Vestibular;Energy conservation;Dry needling    PT Next Visit Plan Final visits focus on balance training.    PT Home Exercise Plan Access Code: [email or web address]  URL: [email or web address]  Date: 11/05/2021  Prepared by: Candyce Churn    Exercises  Sit to Stand - 1 x daily - 7 x weekly - 1 sets - 5 reps  Sit to Stand Without Arm Support - 1 x daily - 7 x weekly - 1 sets - 5 reps  Seated Toe Raise - 1 x daily - 7 x weekly - 1 sets - 20 reps  Seated Long Arc Quad - 1 x daily - 7 x weekly - 1 sets - 20 reps  Seated March - 1 x daily - 7 x weekly - 1 sets - 20 reps    Consulted and Agree with Plan of Care Patient             Patient will benefit from skilled therapeutic intervention in order to improve the following deficits and impairments:  Abnormal gait, Decreased balance, Decreased  endurance, Decreased mobility, Difficulty walking, Impaired sensation, Decreased range of motion,  Decreased coordination, Decreased knowledge of use of DME, Decreased safety awareness, Decreased strength, Impaired UE functional use  Visit Diagnosis: Repeated falls  Muscle weakness (generalized)  Unsteadiness on feet  Hemiplegia and hemiparesis following cerebral infarction affecting left non-dominant side (HCC)  Other abnormalities of gait and mobility     Problem List Patient Active Problem List   Diagnosis Date Noted   Hemiparesis affecting left side as late effect of cerebrovascular accident ([place]) 12/09/2021   Depressive reaction    Sleep disturbance    Urinary retention    Bradycardia    Essential hypertension    Temperature elevated    Left thyroid nodule    Treatment-emergent central sleep apnea 10/25/2018   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) 10/25/2018   Chronic pain syndrome    Chronic bilateral low back pain    Benign essential HTN    Tobacco abuse    Hyperlipidemia    History of CVA with residual deficit    Dysphagia, post-stroke    ICH (intracerebral hemorrhage) ([place]) - R thalmic/PLIC d/t HTN 23/76/2831   Insomnia 07/13/2017   PAD (peripheral artery disease) ([place]) 07/13/2017   Hyperlipidemia with target LDL less than 70 07/13/2017   Stroke-like symptom 09/22/2016   Paresthesia 09/22/2016   CVA (cerebral vascular accident) ([place]) 09/22/2016   Cerebrovascular accident (CVA) due to thrombosis of left carotid artery ([place]) 09/23/2015   Primary snoring 09/23/2015   Hypersomnia with sleep apnea 09/23/2015   Obstructive sleep apnea 09/23/2015   Lacunar infarct, acute ([place]) 08/25/2015   Sleep apnea 08/25/2015   Dysarthria    CVD (cardiovascular disease) 08/02/2015   Acute hyperglycemia 51/76/1607   Systolic hypertension with cerebrovascular disease 12/27/2014   Epistaxis 07/28/2012   NEPHROLITHIASIS 05/23/2009   BARRETTS ESOPHAGUS 02/20/2009   Gastroparesis 02/20/2009   RADICULOPATHY 10/21/2008   GERD 10/08/2008    ESOPHAGITIS 08/15/2008   ESOPHAGEAL STRICTURE 08/15/2008   GASTRITIS, ACUTE 08/15/2008   RENAL CYST 08/14/2008   TOBACCO ABUSE 08/08/2008   HEMATURIA, MICROSCOPIC, HX OF 08/08/2008   PULMONARY NODULE 01/10/2008    Rockie Schnoor B. Evee Liska, PT 01/26/232:00 PM   [facility] @ [address] [place], [place], [address] Phone: [phone]   Fax:  [phone]  Name: Richard Vaughn MRN: [id number] Date of Birth: [birth year]

## 2021-12-15 ENCOUNTER — Other Ambulatory Visit: Payer: Self-pay

## 2021-12-15 ENCOUNTER — Ambulatory Visit: Payer: Medicare Other

## 2021-12-15 DIAGNOSIS — I69354 Hemiplegia and hemiparesis following cerebral infarction affecting left non-dominant side: Secondary | ICD-10-CM | POA: Diagnosis not present

## 2021-12-15 DIAGNOSIS — R2681 Unsteadiness on feet: Secondary | ICD-10-CM

## 2021-12-15 DIAGNOSIS — M6281 Muscle weakness (generalized): Secondary | ICD-10-CM | POA: Diagnosis not present

## 2021-12-15 DIAGNOSIS — R296 Repeated falls: Secondary | ICD-10-CM

## 2021-12-15 DIAGNOSIS — R2689 Other abnormalities of gait and mobility: Secondary | ICD-10-CM | POA: Diagnosis not present

## 2021-12-15 NOTE — Therapy (Signed)
[place] @ [place], [place], [address] Phone: [phone]   Fax:  [phone]  Physical Therapy Reassessment  Patient Details  Name: Richard Vaughn MRN: [id number] Date of Birth: 1951/07/14 Referring Provider (PT): Richard Martinique, MD   Encounter Date: 12/15/2021   PT End of Session - 12/15/21 1310     Visit Number 10    Date for PT Re-Evaluation 12/29/21    Authorization Type Medicare    Progress Note Due on Visit 20    PT Start Time 1235    PT Stop Time 1315    PT Time Calculation (min) 40 min    Activity Tolerance Patient tolerated treatment well    Behavior During Therapy [facility] for tasks assessed/performed             Past Medical History:  Diagnosis Date   Barrett's esophagus    Chronic back pain    Depression    Gastritis    Mild   Gastroparesis    GERD (gastroesophageal reflux disease)    Headache    History of kidney stones    Hyperglycemia    Hypertension    Nephrolithiasis    Pneumonia    Covid pneumonia   Renal cyst    Stricture and stenosis of esophagus    Stroke [facility]) 07-2015   Vision abnormalities     Past Surgical History:  Procedure Laterality Date   CERVICAL [place] SURGERY  2012   COLONOSCOPY     LUMBAR [place] SURGERY  2005, 2010   replaced L4 and L5   SHOULDER ARTHROSCOPY WITH ROTATOR CUFF REPAIR Left 08/14/2020   Procedure: SHOULDER ARTHROSCOPY WITH ROTATOR CUFF REPAIR, SUBACROMIAL DECOMPRESSION;  Surgeon: Richard Ade, MD;  Location: [facility];  Service: Orthopedics;  Laterality: Left;   UPPER GASTROINTESTINAL ENDOSCOPY      There were no vitals filed for this visit.   Subjective Assessment - 12/15/21 1241     Subjective Patient states he is doing ok.  Complaints of low back pain at 4/10.    Patient is accompained by: Family member    Limitations Walking    How long can you walk comfortably? 15 min    Diagnostic tests none recent    Patient Stated Goals to reduce  falls and fall risk and be able to get back to the gym.                [facility] PT Assessment - 12/15/21 0001       Assessment   Medical Diagnosis chronic pain    Referring Provider (PT) Richard Martinique, MD    Onset Date/Surgical Date 11/15/20    Hand Dominance Right    Next MD Visit as needed      Cognition   Overall Cognitive Status Within Functional Limits for tasks assessed      Sensation   Light Touch Appears Intact    Proprioception Impaired Detail    Proprioception Impaired Details Impaired LUE      Functional Tests   Functional tests Sit to Stand      Sit to Stand   Comments Able to use right hand only to complete safely but can do indpendently      Strength   Overall Strength Deficits    Overall Strength Comments right UE and LE generally 5- to 5/5, Left UE improved slightly to 3+/5 , Left LE generally 4+/5 (hip extension and abd against gravity still 4/5)  Transfers   Sit to Stand 6: Modified independent (Device/Increase time)    Five time sit to stand comments  39.07 sec with use of bilateral UE's      Timed Up and Go Test   TUG Normal TUG    Normal TUG (seconds) 32.21    TUG Comments use of cane                                      PT Short Term Goals - 12/15/21 1327       PT SHORT TERM GOAL #1   Title Patient to be indepdent with initial HEP    Time 4    Period Weeks    Status Achieved    Target Date 12/01/21      PT SHORT TERM GOAL #2   Title Patient will report no falls for 4 weeks    Baseline no falls reported    Time 4    Period Weeks    Status Achieved    Target Date 12/01/21               PT Long Term Goals - 12/15/21 1328       PT LONG TERM GOAL #1   Title Patient will be indepdnent with a full pool program they can carryover at the [facility] Patient reports understanding of aquatics HEP during treatment.  (Patient has decided to go to gym instead)    Time 8    Period Weeks    Status  Achieved    Target Date 12/29/21      PT LONG TERM GOAL #2   Title TUG score to be 20 sec or less    Baseline TUG 32 sec on 12-15-21    Time 8    Period Weeks    Status On-going    Target Date 12/29/21      PT LONG TERM GOAL #3   Title 5 times sit to stand will be 18 sec or less    Baseline Todays score: 39 sec    Time 8    Period Weeks    Status On-going      PT LONG TERM GOAL #4   Title Patient will be able to ambulate with correct sequencing with spc safely for 100 feet    Time 8    Period Weeks    Status Achieved    Target Date 12/29/21      PT LONG TERM GOAL #5   Title Patient will have 5/5 strength throughlout lower extremity to ambulate around community safely.    Baseline 4+/5 left hip flexion and 4+/5 left hip abduction    Time 6    Period Weeks    Status On-going    Target Date 12/26/21                   Plan - 12/15/21 1321     Clinical Impression Statement Richard Vaughn was reassessed today.  He does show some improvement with strength and is able to ambulate safely with [facility] independently on level surfaces.  He is also able to come to stand with single UE demonstrating improved weight shift. He has one week left after this week.  We discussed pulling a home plan together for a good balance of home exercises, walking and going to the gym.  He would benefit from continuing skilled PT  for balance training and gait training.    Personal Factors and Comorbidities Comorbidity 1;Comorbidity 2    Comorbidities Hx CVA and chronic low back pain    Examination-Activity Limitations Bathing;Stand;Stairs    Examination-Participation Restrictions Driving;Community Activity    Stability/Clinical Decision Making Evolving/Moderate complexity    Clinical Decision Making Moderate    Rehab Potential Good    PT Frequency 2x / week    PT Duration 8 weeks    PT Treatment/Interventions ADLs/Self Care Home Management;Aquatic Therapy;Traction;Moist Heat;Iontophoresis 4mg /ml  Dexamethasone;Electrical Stimulation;Cryotherapy;Ultrasound;DME Instruction;Gait training;Therapeutic exercise;Therapeutic activities;Functional mobility training;Stair training;Balance training;Neuromuscular re-education;Patient/family education;Manual techniques;Passive range of motion;Vestibular;Energy conservation;Dry needling    PT Next Visit Plan Final visits focus on balance training.    PT Home Exercise Plan Access Code: [email or web address]  URL: [email or web address]  Date: 11/05/2021  Prepared by: Candyce Churn    Exercises  Sit to Stand - 1 x daily - 7 x weekly - 1 sets - 5 reps  Sit to Stand Without Arm Support - 1 x daily - 7 x weekly - 1 sets - 5 reps  Seated Toe Raise - 1 x daily - 7 x weekly - 1 sets - 20 reps  Seated Long Arc Quad - 1 x daily - 7 x weekly - 1 sets - 20 reps  Seated March - 1 x daily - 7 x weekly - 1 sets - 20 reps    Consulted and Agree with Plan of Care Patient             Patient will benefit from skilled therapeutic intervention in order to improve the following deficits and impairments:  Abnormal gait, Decreased balance, Decreased endurance, Decreased mobility, Difficulty walking, Impaired sensation, Decreased range of motion, Decreased coordination, Decreased knowledge of use of DME, Decreased safety awareness, Decreased strength, Impaired UE functional use  Visit Diagnosis: Repeated falls  Muscle weakness (generalized)  Unsteadiness on feet  Hemiplegia and hemiparesis following cerebral infarction affecting left non-dominant side [facility])     Problem List Patient Active Problem List   Diagnosis Date Noted   Hemiparesis affecting left side as late effect of cerebrovascular accident ([place]) 12/09/2021   Depressive reaction    Sleep disturbance    Urinary retention    Bradycardia    Essential hypertension    Temperature elevated    Left thyroid nodule    Treatment-emergent central sleep apnea 10/25/2018   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) 10/25/2018   Chronic pain syndrome    Chronic bilateral low back pain    Benign essential HTN    Tobacco abuse    Hyperlipidemia    History of CVA with residual deficit    Dysphagia, post-stroke    ICH (intracerebral hemorrhage) (HCC) - R thalmic/PLIC d/t HTN 15/40/0867   Insomnia 07/13/2017   PAD (peripheral artery disease) ([place]) 07/13/2017   Hyperlipidemia with target LDL less than 70 07/13/2017   Stroke-like symptom 09/22/2016   Paresthesia 09/22/2016   CVA (cerebral vascular accident) ([place]) 09/22/2016   Cerebrovascular accident (CVA) due to thrombosis of left carotid artery ([place]) 09/23/2015   Primary snoring 09/23/2015   Hypersomnia with sleep apnea 09/23/2015   Obstructive sleep apnea 09/23/2015   Lacunar infarct, acute ([place]) 08/25/2015   Sleep apnea 08/25/2015   Dysarthria    CVD (cardiovascular disease) 08/02/2015   Acute hyperglycemia 61/95/0932   Systolic hypertension with cerebrovascular disease 12/27/2014   Epistaxis 07/28/2012   NEPHROLITHIASIS 05/23/2009   BARRETTS ESOPHAGUS 02/20/2009   Gastroparesis 02/20/2009   RADICULOPATHY  10/21/2008   GERD 10/08/2008   ESOPHAGITIS 08/15/2008   ESOPHAGEAL STRICTURE 08/15/2008   GASTRITIS, ACUTE 08/15/2008   RENAL CYST 08/14/2008   TOBACCO ABUSE 08/08/2008   HEMATURIA, MICROSCOPIC, HX OF 08/08/2008   PULMONARY NODULE 01/10/2008    Kayde Atkerson B. Quint Chestnut, PT 01/31/231:32 PM   [place] @ [place], [place], [address] Phone: [phone]   Fax:  [phone]  Name: Richard Vaughn MRN: [id number] Date of Birth: 1951/09/14

## 2021-12-17 ENCOUNTER — Other Ambulatory Visit: Payer: Self-pay

## 2021-12-17 ENCOUNTER — Ambulatory Visit: Payer: Medicare Other | Attending: Family Medicine

## 2021-12-17 DIAGNOSIS — R2689 Other abnormalities of gait and mobility: Secondary | ICD-10-CM | POA: Insufficient documentation

## 2021-12-17 DIAGNOSIS — M25512 Pain in left shoulder: Secondary | ICD-10-CM | POA: Insufficient documentation

## 2021-12-17 DIAGNOSIS — G8929 Other chronic pain: Secondary | ICD-10-CM | POA: Insufficient documentation

## 2021-12-17 DIAGNOSIS — R296 Repeated falls: Secondary | ICD-10-CM | POA: Insufficient documentation

## 2021-12-17 DIAGNOSIS — I69354 Hemiplegia and hemiparesis following cerebral infarction affecting left non-dominant side: Secondary | ICD-10-CM | POA: Insufficient documentation

## 2021-12-17 DIAGNOSIS — R278 Other lack of coordination: Secondary | ICD-10-CM | POA: Diagnosis not present

## 2021-12-17 DIAGNOSIS — M6281 Muscle weakness (generalized): Secondary | ICD-10-CM | POA: Diagnosis not present

## 2021-12-17 DIAGNOSIS — R2681 Unsteadiness on feet: Secondary | ICD-10-CM | POA: Diagnosis not present

## 2021-12-17 NOTE — Therapy (Signed)
[place] @ [place], [place], [address] Phone: [phone]   Fax:  [phone]  Physical Therapy Treatment  Patient Details  Name: Richard Vaughn MRN: [id number] Date of Birth: 1951/11/04 Referring Provider (PT): Richard Martinique, MD   Encounter Date: 12/17/2021   PT End of Session - 12/17/21 1357     Visit Number 11    Date for PT Re-Evaluation 12/29/21    Authorization Type Medicare    Progress Note Due on Visit 20    PT Start Time 1233    PT Stop Time 1314    PT Time Calculation (min) 41 min    Activity Tolerance Patient tolerated treatment well    Behavior During Therapy [facility] for tasks assessed/performed             Past Medical History:  Diagnosis Date   Barrett's esophagus    Chronic back pain    Depression    Gastritis    Mild   Gastroparesis    GERD (gastroesophageal reflux disease)    Headache    History of kidney stones    Hyperglycemia    Hypertension    Nephrolithiasis    Pneumonia    Covid pneumonia   Renal cyst    Stricture and stenosis of esophagus    Stroke [facility]) 07-2015   Vision abnormalities     Past Surgical History:  Procedure Laterality Date   CERVICAL [place] SURGERY  2012   COLONOSCOPY     LUMBAR [place] SURGERY  2005, 2010   replaced L4 and L5   SHOULDER ARTHROSCOPY WITH ROTATOR CUFF REPAIR Left 08/14/2020   Procedure: SHOULDER ARTHROSCOPY WITH ROTATOR CUFF REPAIR, SUBACROMIAL DECOMPRESSION;  Surgeon: Richard Ade, MD;  [facility];  Service: Orthopedics;  Laterality: Left;   UPPER GASTROINTESTINAL ENDOSCOPY      There were no vitals filed for this visit.   Subjective Assessment - 12/17/21 1245     Subjective Patient states he is doing well.  He denies any pain and is in no acute distress.    Patient is accompained by: Family member    Limitations Walking    How long can you walk comfortably? 15 min    Diagnostic tests none recent    Patient Stated Goals  to reduce falls and fall risk and be able to get back to the gym.    Currently in Pain? Yes    Pain Score 4     Pain Location Back    Pain Orientation Lower    Pain Descriptors / Indicators Discomfort    Pain Type Chronic pain    Pain Onset 1 to 4 weeks ago    Pain Frequency Occasional                               [facility] Adult PT Treatment/Exercise - 12/17/21 0001       Lumbar Exercises: Aerobic   Nustep Level 4 x 5 min      Knee/Hip Exercises: Machines for Strengthening   Cybex Leg Press 100 lb x20    Hip Cybex 55 lb x 20 each of hip abd and extension      Knee/Hip Exercises: Seated   Long Arc Quad Strengthening;Both;1 set;20 reps    Hamstring Curl Strengthening;Both;1 set;20 reps    Hamstring Limitations green tband      Shoulder Exercises: Seated   Row Strengthening;Both;20 reps;Theraband  Theraband Level (Shoulder Row) Level 3 (Green)    Other Seated Exercises Bicep curls green tband 2 x 1    Other Seated Exercises Tricep press green tband x 20 each                     PT Education - 12/17/21 1545     Education Details Educated on weight shifting fwd during sit to stand to avoid multiple attempts at coming to stand which likely causes more fatigue throughout the day.              PT Short Term Goals - 12/15/21 1327       PT SHORT TERM GOAL #1   Title Patient to be indepdent with initial HEP    Time 4    Period Weeks    Status Achieved    Target Date 12/01/21      PT SHORT TERM GOAL #2   Title Patient will report no falls for 4 weeks    Baseline no falls reported    Time 4    Period Weeks    Status Achieved    Target Date 12/01/21               PT Long Term Goals - 12/15/21 1328       PT LONG TERM GOAL #1   Title Patient will be indepdnent with a full pool program they can carryover at the [place] Patient reports understanding of aquatics HEP during treatment.  (Patient has decided to go to gym  instead)    Time 8    Period Weeks    Status Achieved    Target Date 12/29/21      PT LONG TERM GOAL #2   Title TUG score to be 20 sec or less    Baseline TUG 32 sec on 12-15-21    Time 8    Period Weeks    Status On-going    Target Date 12/29/21      PT LONG TERM GOAL #3   Title 5 times sit to stand will be 18 sec or less    Baseline Todays score: 39 sec    Time 8    Period Weeks    Status On-going      PT LONG TERM GOAL #4   Title Patient will be able to ambulate with correct sequencing with spc safely for 100 feet    Time 8    Period Weeks    Status Achieved    Target Date 12/29/21      PT LONG TERM GOAL #5   Title Patient will have 5/5 strength throughlout lower extremity to ambulate around community safely.    Baseline 4+/5 left hip flexion and 4+/5 left hip abduction    Time 6    Period Weeks    Status On-going    Target Date 12/26/21                   Plan - 12/17/21 1546     Clinical Impression Statement [name] was able to come to stand without use of UE's x 3 today with heavy verbal cues for weight shift fwd.  He is able to do fairly high level isotonic strengthening exercises and would benefit from going to gym with his wife.  He continues to need occasional verbal cues for clearing the left foot during ambulation.  Verbal cues for increase step length are effective at resolving  this.  He would benefit from one more week of skilled PT to prepare for DC to carry through a comprehensive HEP and gym program.    Personal Factors and Comorbidities Comorbidity 1;Comorbidity 2    Comorbidities Hx CVA and chronic low back pain    Examination-Activity Limitations Bathing;Stand;Stairs    Examination-Participation Restrictions Driving;Community Activity    Stability/Clinical Decision Making Evolving/Moderate complexity    Clinical Decision Making Moderate    Rehab Potential Good    PT Frequency 2x / week    PT Duration 8 weeks    PT Treatment/Interventions  ADLs/Self Care Home Management;Aquatic Therapy;Traction;Moist Heat;Iontophoresis 4mg /ml Dexamethasone;Electrical Stimulation;Cryotherapy;Ultrasound;DME Instruction;Gait training;Therapeutic exercise;Therapeutic activities;Functional mobility training;Stair training;Balance training;Neuromuscular re-education;Patient/family education;Manual techniques;Passive range of motion;Vestibular;Energy conservation;Dry needling    PT Next Visit Plan Prepare for DC.  Develop clear home program for patient and wife to continue independently.    PT Home Exercise Plan Access Code: [email or web address]  URL: [email or web address]  Date: 11/05/2021  Prepared by: Candyce Churn    Exercises  Sit to Stand - 1 x daily - 7 x weekly - 1 sets - 5 reps  Sit to Stand Without Arm Support - 1 x daily - 7 x weekly - 1 sets - 5 reps  Seated Toe Raise - 1 x daily - 7 x weekly - 1 sets - 20 reps  Seated Long Arc Quad - 1 x daily - 7 x weekly - 1 sets - 20 reps  Seated March - 1 x daily - 7 x weekly - 1 sets - 20 reps    Consulted and Agree with Plan of Care Patient             Patient will benefit from skilled therapeutic intervention in order to improve the following deficits and impairments:  Abnormal gait, Decreased balance, Decreased endurance, Decreased mobility, Difficulty walking, Impaired sensation, Decreased range of motion, Decreased coordination, Decreased knowledge of use of DME, Decreased safety awareness, Decreased strength, Impaired UE functional use  Visit Diagnosis: Repeated falls  Muscle weakness (generalized)  Unsteadiness on feet  Hemiplegia and hemiparesis following cerebral infarction affecting left non-dominant side (HCC)  Chronic left shoulder pain  Other abnormalities of gait and mobility     Problem List Patient Active Problem List   Diagnosis Date Noted   Hemiparesis affecting left side as late effect of cerebrovascular accident ([place]) 12/09/2021   Depressive reaction    Sleep  disturbance    Urinary retention    Bradycardia    Essential hypertension    Temperature elevated    Left thyroid nodule    Treatment-emergent central sleep apnea 10/25/2018   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) 10/25/2018   Chronic pain syndrome    Chronic bilateral low back pain    Benign essential HTN    Tobacco abuse    Hyperlipidemia    History of CVA with residual deficit    Dysphagia, post-stroke    ICH (intracerebral hemorrhage) (HCC) - R thalmic/PLIC d/t HTN 03/50/0938   Insomnia 07/13/2017   PAD (peripheral artery disease) ([place]) 07/13/2017   Hyperlipidemia with target LDL less than 70 07/13/2017   Stroke-like symptom 09/22/2016   Paresthesia 09/22/2016   CVA (cerebral vascular accident) ([place]) 09/22/2016   Cerebrovascular accident (CVA) due to thrombosis of left carotid artery ([place]) 09/23/2015   Primary snoring 09/23/2015   Hypersomnia with sleep apnea 09/23/2015   Obstructive sleep apnea 09/23/2015   Lacunar infarct, acute ([place]) 08/25/2015   Sleep apnea 08/25/2015   Dysarthria  CVD (cardiovascular disease) 08/02/2015   Acute hyperglycemia 06/00/4599   Systolic hypertension with cerebrovascular disease 12/27/2014   Epistaxis 07/28/2012   NEPHROLITHIASIS 05/23/2009   BARRETTS ESOPHAGUS 02/20/2009   Gastroparesis 02/20/2009   RADICULOPATHY 10/21/2008   GERD 10/08/2008   ESOPHAGITIS 08/15/2008   ESOPHAGEAL STRICTURE 08/15/2008   GASTRITIS, ACUTE 08/15/2008   RENAL CYST 08/14/2008   TOBACCO ABUSE 08/08/2008   HEMATURIA, MICROSCOPIC, HX OF 08/08/2008   PULMONARY NODULE 01/10/2008    Ralph Brouwer B. Girolamo Lortie, PT 02/02/233:51 PM   [place] @ [place], [place], [address] Phone: [phone]   Fax:  [phone]  Name: Richard Vaughn MRN: [id number] Date of Birth: 11/10/1951

## 2021-12-22 ENCOUNTER — Ambulatory Visit: Payer: Medicare Other

## 2021-12-22 ENCOUNTER — Other Ambulatory Visit: Payer: Self-pay

## 2021-12-22 DIAGNOSIS — M25512 Pain in left shoulder: Secondary | ICD-10-CM | POA: Diagnosis not present

## 2021-12-22 DIAGNOSIS — G8929 Other chronic pain: Secondary | ICD-10-CM | POA: Diagnosis not present

## 2021-12-22 DIAGNOSIS — R296 Repeated falls: Secondary | ICD-10-CM | POA: Diagnosis not present

## 2021-12-22 DIAGNOSIS — I69354 Hemiplegia and hemiparesis following cerebral infarction affecting left non-dominant side: Secondary | ICD-10-CM | POA: Diagnosis not present

## 2021-12-22 DIAGNOSIS — R2681 Unsteadiness on feet: Secondary | ICD-10-CM | POA: Diagnosis not present

## 2021-12-22 DIAGNOSIS — M6281 Muscle weakness (generalized): Secondary | ICD-10-CM

## 2021-12-22 DIAGNOSIS — R2689 Other abnormalities of gait and mobility: Secondary | ICD-10-CM

## 2021-12-22 DIAGNOSIS — R278 Other lack of coordination: Secondary | ICD-10-CM

## 2021-12-22 NOTE — Therapy (Signed)
[facility] @ [facility] [place], [place], [address] Phone: [phone]   Fax:  [phone]  Physical Therapy Treatment  Patient Details  Name: Richard Vaughn MRN: [id number] Date of Birth: 08/07/1951 Referring Provider (PT): Betty Martinique, MD   Encounter Date: 12/22/2021   PT End of Session - 12/22/21 1240     Visit Number 12    Date for PT Re-Evaluation 12/29/21    Authorization Type Medicare    Progress Note Due on Visit 20    PT Start Time 1234    PT Stop Time 1310    PT Time Calculation (min) 36 min    Activity Tolerance Patient tolerated treatment well    Behavior During Therapy [facility] for tasks assessed/performed             Past Medical History:  Diagnosis Date   Barrett's esophagus    Chronic back pain    Depression    Gastritis    Mild   Gastroparesis    GERD (gastroesophageal reflux disease)    Headache    History of kidney stones    Hyperglycemia    Hypertension    Nephrolithiasis    Pneumonia    Covid pneumonia   Renal cyst    Stricture and stenosis of esophagus    Stroke [facility]) 07-2015   Vision abnormalities     Past Surgical History:  Procedure Laterality Date   CERVICAL [place] SURGERY  2012   COLONOSCOPY     LUMBAR [place] SURGERY  2005, 2010   replaced L4 and L5   SHOULDER ARTHROSCOPY WITH ROTATOR CUFF REPAIR Left 08/14/2020   Procedure: SHOULDER ARTHROSCOPY WITH ROTATOR CUFF REPAIR, SUBACROMIAL DECOMPRESSION;  Surgeon: Tania Ade, MD;  Location: [facility];  Service: Orthopedics;  Laterality: Left;   UPPER GASTROINTESTINAL ENDOSCOPY      There were no vitals filed for this visit.                      Center Adult PT Treatment/Exercise - 12/22/21 0001       Lumbar Exercises: Aerobic   Nustep Level 4 x 5 min      Knee/Hip Exercises: Standing   Heel Raises Both;1 set;20 reps    Side Lunges Both;2 sets;10 reps    Hip ADduction Strengthening;Both;1 set;20 reps     Abduction Limitations green loop    Other Standing Knee Exercises LE band walks green loop x 5 (at treadmill)    Other Standing Knee Exercises Marching with green loop x 20      Knee/Hip Exercises: Seated   Long Arc Quad Strengthening;Both;1 set;20 reps    Long Arc Quad Weight 5 lbs.    Clamshell with TheraBand Green    Marching Strengthening;Both;1 set;20 reps;Weights    Marching Weights 5 lbs.                       PT Short Term Goals - 12/15/21 1327       PT SHORT TERM GOAL #1   Title Patient to be indepdent with initial HEP    Time 4    Period Weeks    Status Achieved    Target Date 12/01/21      PT SHORT TERM GOAL #2   Title Patient will report no falls for 4 weeks    Baseline no falls reported    Time 4    Period Weeks    Status  Achieved    Target Date 12/01/21               PT Long Term Goals - 12/15/21 1328       PT LONG TERM GOAL #1   Title Patient will be indepdnent with a full pool program they can carryover at the [facility] Patient reports understanding of aquatics HEP during treatment.  (Patient has decided to go to gym instead)    Time 8    Period Weeks    Status Achieved    Target Date 12/29/21      PT LONG TERM GOAL #2   Title TUG score to be 20 sec or less    Baseline TUG 32 sec on 12-15-21    Time 8    Period Weeks    Status On-going    Target Date 12/29/21      PT LONG TERM GOAL #3   Title 5 times sit to stand will be 18 sec or less    Baseline Todays score: 39 sec    Time 8    Period Weeks    Status On-going      PT LONG TERM GOAL #4   Title Patient will be able to ambulate with correct sequencing with spc safely for 100 feet    Time 8    Period Weeks    Status Achieved    Target Date 12/29/21      PT LONG TERM GOAL #5   Title Patient will have 5/5 strength throughlout lower extremity to ambulate around community safely.    Baseline 4+/5 left hip flexion and 4+/5 left hip abduction    Time 6    Period  Weeks    Status On-going    Target Date 12/26/21                   Plan - 12/22/21 1302     Clinical Impression Statement Patient was able to complete all tasks today with increased resistance on several exercises.  He is progresing appropriately and should be ready for DC next week at original POC date.  He would benefit from continued skilled PT to develop DC plan for he and his wife to continue independently upon DC.    Personal Factors and Comorbidities Comorbidity 1;Comorbidity 2    Comorbidities Hx CVA and chronic low back pain    Examination-Activity Limitations Bathing;Stand;Stairs    Examination-Participation Restrictions Driving;Community Activity    Stability/Clinical Decision Making Evolving/Moderate complexity    Clinical Decision Making Moderate    Rehab Potential Good    PT Frequency 2x / week    PT Duration 8 weeks    PT Treatment/Interventions ADLs/Self Care Home Management;Aquatic Therapy;Traction;Moist Heat;Iontophoresis 4mg /ml Dexamethasone;Electrical Stimulation;Cryotherapy;Ultrasound;DME Instruction;Gait training;Therapeutic exercise;Therapeutic activities;Functional mobility training;Stair training;Balance training;Neuromuscular re-education;Patient/family education;Manual techniques;Passive range of motion;Vestibular;Energy conservation;Dry needling    PT Next Visit Plan Prepare for DC.  Develop clear home program for patient and wife to continue independently.    PT Home Exercise Plan Access Code: [email or web address]  URL: [email or web address]  Date: 11/05/2021  Prepared by: Candyce Churn    Exercises  Sit to Stand - 1 x daily - 7 x weekly - 1 sets - 5 reps  Sit to Stand Without Arm Support - 1 x daily - 7 x weekly - 1 sets - 5 reps  Seated Toe Raise - 1 x daily - 7 x weekly - 1 sets - 20 reps  Seated Long Arc Quad - 1  x daily - 7 x weekly - 1 sets - 20 reps  Seated March - 1 x daily - 7 x weekly - 1 sets - 20 reps    Consulted and Agree with Plan of Care  Patient             Patient will benefit from skilled therapeutic intervention in order to improve the following deficits and impairments:  Abnormal gait, Decreased balance, Decreased endurance, Decreased mobility, Difficulty walking, Impaired sensation, Decreased range of motion, Decreased coordination, Decreased knowledge of use of DME, Decreased safety awareness, Decreased strength, Impaired UE functional use  Visit Diagnosis: Repeated falls  Muscle weakness (generalized)  Unsteadiness on feet  Hemiplegia and hemiparesis following cerebral infarction affecting left non-dominant side (HCC)  Other abnormalities of gait and mobility  Other lack of coordination     Problem List Patient Active Problem List   Diagnosis Date Noted   Hemiparesis affecting left side as late effect of cerebrovascular accident ([place]) 12/09/2021   Depressive reaction    Sleep disturbance    Urinary retention    Bradycardia    Essential hypertension    Temperature elevated    Left thyroid nodule    Treatment-emergent central sleep apnea 10/25/2018   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) 10/25/2018   Chronic pain syndrome    Chronic bilateral low back pain    Benign essential HTN    Tobacco abuse    Hyperlipidemia    History of CVA with residual deficit    Dysphagia, post-stroke    ICH (intracerebral hemorrhage) ([place]) - R thalmic/PLIC d/t HTN 81/27/5170   Insomnia 07/13/2017   PAD (peripheral artery disease) ([place]) 07/13/2017   Hyperlipidemia with target LDL less than 70 07/13/2017   Stroke-like symptom 09/22/2016   Paresthesia 09/22/2016   CVA (cerebral vascular accident) ([place]) 09/22/2016   Cerebrovascular accident (CVA) due to thrombosis of left carotid artery ([place]) 09/23/2015   Primary snoring 09/23/2015   Hypersomnia with sleep apnea 09/23/2015   Obstructive sleep apnea 09/23/2015   Lacunar infarct, acute ([place]) 08/25/2015   Sleep apnea 08/25/2015    Dysarthria    CVD (cardiovascular disease) 08/02/2015   Acute hyperglycemia 01/74/9449   Systolic hypertension with cerebrovascular disease 12/27/2014   Epistaxis 07/28/2012   NEPHROLITHIASIS 05/23/2009   BARRETTS ESOPHAGUS 02/20/2009   Gastroparesis 02/20/2009   RADICULOPATHY 10/21/2008   GERD 10/08/2008   ESOPHAGITIS 08/15/2008   ESOPHAGEAL STRICTURE 08/15/2008   GASTRITIS, ACUTE 08/15/2008   RENAL CYST 08/14/2008   TOBACCO ABUSE 08/08/2008   HEMATURIA, MICROSCOPIC, HX OF 08/08/2008   PULMONARY NODULE 01/10/2008    Isabel Caprice, PT 12/22/2021, 1:39 PM  [facility] @ [place], [place], [address] Phone: [phone]   Fax:  [phone]  Name: TRAVELLE MCCLIMANS MRN: [id number] Date of Birth: 1951-10-23

## 2021-12-24 ENCOUNTER — Other Ambulatory Visit: Payer: Self-pay

## 2021-12-24 ENCOUNTER — Ambulatory Visit: Payer: Medicare Other

## 2021-12-24 DIAGNOSIS — R296 Repeated falls: Secondary | ICD-10-CM

## 2021-12-24 DIAGNOSIS — I69354 Hemiplegia and hemiparesis following cerebral infarction affecting left non-dominant side: Secondary | ICD-10-CM

## 2021-12-24 DIAGNOSIS — R278 Other lack of coordination: Secondary | ICD-10-CM

## 2021-12-24 DIAGNOSIS — M6281 Muscle weakness (generalized): Secondary | ICD-10-CM | POA: Diagnosis not present

## 2021-12-24 DIAGNOSIS — R2689 Other abnormalities of gait and mobility: Secondary | ICD-10-CM

## 2021-12-24 DIAGNOSIS — G8929 Other chronic pain: Secondary | ICD-10-CM | POA: Diagnosis not present

## 2021-12-24 DIAGNOSIS — R2681 Unsteadiness on feet: Secondary | ICD-10-CM

## 2021-12-24 DIAGNOSIS — M25512 Pain in left shoulder: Secondary | ICD-10-CM | POA: Diagnosis not present

## 2021-12-24 NOTE — Therapy (Signed)
[facility] @ [place], [place], [address] Phone: [phone]   Fax:  [phone]  Physical Therapy Treatment  Patient Details  Name: Richard Vaughn MRN: [id number] Date of Birth: June 22, 1951 Referring Provider (PT): Betty Martinique, MD   Encounter Date: 12/24/2021   PT End of Session - 12/24/21 1537     Visit Number 13    Date for PT Re-Evaluation 12/29/21    Authorization Type Medicare    Progress Note Due on Visit 20    PT Start Time 1235    PT Stop Time 1315    PT Time Calculation (min) 40 min    Activity Tolerance Patient tolerated treatment well    Behavior During Therapy [facility] for tasks assessed/performed             Past Medical History:  Diagnosis Date   Barrett's esophagus    Chronic back pain    Depression    Gastritis    Mild   Gastroparesis    GERD (gastroesophageal reflux disease)    Headache    History of kidney stones    Hyperglycemia    Hypertension    Nephrolithiasis    Pneumonia    Covid pneumonia   Renal cyst    Stricture and stenosis of esophagus    Stroke [facility]) 07-2015   Vision abnormalities     Past Surgical History:  Procedure Laterality Date   CERVICAL [place] SURGERY  2012   COLONOSCOPY     LUMBAR [place] SURGERY  2005, 2010   replaced L4 and L5   SHOULDER ARTHROSCOPY WITH ROTATOR CUFF REPAIR Left 08/14/2020   Procedure: SHOULDER ARTHROSCOPY WITH ROTATOR CUFF REPAIR, SUBACROMIAL DECOMPRESSION;  Surgeon: Tania Ade, MD;  [facility];  Service: Orthopedics;  Laterality: Left;   UPPER GASTROINTESTINAL ENDOSCOPY      There were no vitals filed for this visit.   Subjective Assessment - 12/24/21 1536     Subjective Patient states Richard Vaughn feels Richard Vaughn is doing well and should be able to continue on his own at the gym.  Richard Vaughn is requesting this be his DC visit.    Patient is accompained by: Family member    Limitations Walking    How long can you walk comfortably? 15 min     Diagnostic tests none recent    Patient Stated Goals to reduce falls and fall risk and be able to get back to the gym.    Currently in Pain? No/denies    Pain Score 0-No pain    Pain Onset 1 to 4 weeks ago                [facility] PT Assessment - 12/24/21 0001       Assessment   Medical Diagnosis chronic pain    Referring Provider (PT) Betty Martinique, MD    Onset Date/Surgical Date 11/15/20    Hand Dominance Right    Next MD Visit as needed      Cognition   Overall Cognitive Status Within Functional Limits for tasks assessed      Sensation   Light Touch Appears Intact    Proprioception Impaired Detail    Proprioception Impaired Details Impaired LUE      Functional Tests   Functional tests Sit to Stand      Sit to Stand   Comments Able to use right hand only to complete safely but can do indpendently      Strength  Overall Strength Deficits    Overall Strength Comments right UE and LE generally 5- to 5/5, Left UE improved slightly to 3+/5 , Left LE generally 4+/5 (hip extension and abd against gravity still 4/5)      Transfers   Sit to Stand 6: Modified independent (Device/Increase time)    Five time sit to stand comments  38.2 sec with use of bilateral UE's      Timed Up and Go Test   TUG Normal TUG    Normal TUG (seconds) 30.24    TUG Comments use of cane                           [facility] Adult PT Treatment/Exercise - 12/24/21 0001       Lumbar Exercises: Aerobic   Nustep Level 4 x 5 min      Knee/Hip Exercises: Machines for Strengthening   Cybex Leg Press 100 lb x20    Hip Cybex 55 lb x 20 each of hip abd and extension      Shoulder Exercises: Power [employer] 20 reps    Row Limitations 40 lbs    Other Power [employer] Exercises Lat pull down x 10 25 lb    Other Power Hydrologist press 25 lb x 20                     PT Education - 12/24/21 1543     Education Details Educated patient and wife on DC plan.    Person(s)  Educated Patient;Spouse    Methods Explanation;Handout    Comprehension Verbalized understanding              PT Short Term Goals - 12/24/21 1547       PT SHORT TERM GOAL #1   Title Patient to be indepdent with initial HEP    Time 4    Period Weeks    Status Achieved    Target Date 12/01/21      PT SHORT TERM GOAL #2   Title Patient will report no falls for 4 weeks    Baseline no falls reported    Time 4    Period Weeks    Status Achieved    Target Date 12/01/21      PT SHORT TERM GOAL #3   Baseline 70 degrees    Time 3    Period Weeks    Status Achieved    Target Date 05/15/21               PT Long Term Goals - 12/24/21 1548       PT LONG TERM GOAL #1   Title Patient will be indepdnent with a full pool program they can carryover at the [facility] Patient reports understanding of aquatics HEP during treatment.  (Patient has decided to go to gym instead)    Time 8    Period Weeks    Status Achieved    Target Date 12/29/21      PT LONG TERM GOAL #2   Title TUG score to be 20 sec or less    Baseline TUG 30 sec on 12/24/21    Time 8    Period Weeks    Status Partially Met    Target Date 12/29/21      PT LONG TERM GOAL #3   Title 5 times sit to stand will be 18 sec or  less    Time 8    Period Weeks    Status Partially Met    Target Date 12/29/21      PT LONG TERM GOAL #4   Title Patient will be able to ambulate with correct sequencing with spc safely for 100 feet    Time 8    Period Weeks    Status Achieved    Target Date 12/29/21      PT LONG TERM GOAL #5   Title Patient will have 5/5 strength throughlout lower extremity to ambulate around community safely.    Time 6    Period Weeks    Status Partially Met    Target Date 12/26/21                   Plan - 12/24/21 1544     Clinical Impression Statement Richard Vaughn requested to be DC'd today vs. next week as Richard Vaughn feels Richard Vaughn is able to follow through with his HEP.  Richard Vaughn and wife are  compliant with his plan, they have checked with the fitness facility that Richard Vaughn will be attending and the will be starting there next week.  Richard Vaughn has met all goals and should continue to improve with overall balance and stability with a consisten walking and strengthening program.    Personal Factors and Comorbidities Comorbidity 1;Comorbidity 2    Comorbidities Hx CVA and chronic low back pain    Examination-Activity Limitations Bathing;Stand;Stairs    Examination-Participation Restrictions Driving;Community Activity    Stability/Clinical Decision Making Evolving/Moderate complexity    Clinical Decision Making Moderate    Rehab Potential Good    PT Frequency 2x / week    PT Duration 8 weeks    PT Treatment/Interventions ADLs/Self Care Home Management;Aquatic Therapy;Traction;Moist Heat;Iontophoresis 54m/ml Dexamethasone;Electrical Stimulation;Cryotherapy;Ultrasound;DME Instruction;Gait training;Therapeutic exercise;Therapeutic activities;Functional mobility training;Stair training;Balance training;Neuromuscular re-education;Patient/family education;Manual techniques;Passive range of motion;Vestibular;Energy conservation;Dry needling    PT Next Visit Plan Patient discharged today.    PT Home Exercise Plan Access Code: [email or web address] URL: [email or web address]  Date: 11/05/2021  Prepared by: JCandyce Churn   Exercises  Sit to Stand - 1 x daily - 7 x weekly - 1 sets - 5 reps  Sit to Stand Without Arm Support - 1 x daily - 7 x weekly - 1 sets - 5 reps  Seated Toe Raise - 1 x daily - 7 x weekly - 1 sets - 20 reps  Seated Long Arc Quad - 1 x daily - 7 x weekly - 1 sets - 20 reps  Seated March - 1 x daily - 7 x weekly - 1 sets - 20 reps    Consulted and Agree with Plan of Care Patient            PHYSICAL THERAPY DISCHARGE SUMMARY  Visits from Start of Care: 13  Current functional level related to goals / functional outcomes: See above   Remaining deficits: See above    Education /  Equipment: See above   Patient agrees to discharge. Patient goals were partially met. Patient is being discharged due to being pleased with the current functional level.  Patient will benefit from skilled therapeutic intervention in order to improve the following deficits and impairments:  Abnormal gait, Decreased balance, Decreased endurance, Decreased mobility, Difficulty walking, Impaired sensation, Decreased range of motion, Decreased coordination, Decreased knowledge of use of DME, Decreased safety awareness, Decreased strength, Impaired UE functional use  Visit Diagnosis: Repeated falls  Muscle weakness (generalized)  Unsteadiness on feet  Hemiplegia and hemiparesis following cerebral infarction affecting left non-dominant side (HCC)  Other abnormalities of gait and mobility  Other lack of coordination     Problem List Patient Active Problem List   Diagnosis Date Noted   Hemiparesis affecting left side as late effect of cerebrovascular accident ([place]) 12/09/2021   Depressive reaction    Sleep disturbance    Urinary retention    Bradycardia    Essential hypertension    Temperature elevated    Left thyroid nodule    Treatment-emergent central sleep apnea 10/25/2018   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) 10/25/2018   Chronic pain syndrome    Chronic bilateral low back pain    Benign essential HTN    Tobacco abuse    Hyperlipidemia    History of CVA with residual deficit    Dysphagia, post-stroke    ICH (intracerebral hemorrhage) ([place]) - R thalmic/PLIC d/t HTN 54/65/0354   Insomnia 07/13/2017   PAD (peripheral artery disease) ([place]) 07/13/2017   Hyperlipidemia with target LDL less than 70 07/13/2017   Stroke-like symptom 09/22/2016   Paresthesia 09/22/2016   CVA (cerebral vascular accident) ([place]) 09/22/2016   Cerebrovascular accident (CVA) due to thrombosis of left carotid artery ([place]) 09/23/2015   Primary snoring 09/23/2015   Hypersomnia  with sleep apnea 09/23/2015   Obstructive sleep apnea 09/23/2015   Lacunar infarct, acute ([place]) 08/25/2015   Sleep apnea 08/25/2015   Dysarthria    CVD (cardiovascular disease) 08/02/2015   Acute hyperglycemia 65/68/1275   Systolic hypertension with cerebrovascular disease 12/27/2014   Epistaxis 07/28/2012   NEPHROLITHIASIS 05/23/2009   BARRETTS ESOPHAGUS 02/20/2009   Gastroparesis 02/20/2009   RADICULOPATHY 10/21/2008   GERD 10/08/2008   ESOPHAGITIS 08/15/2008   ESOPHAGEAL STRICTURE 08/15/2008   GASTRITIS, ACUTE 08/15/2008   RENAL CYST 08/14/2008   TOBACCO ABUSE 08/08/2008   HEMATURIA, MICROSCOPIC, HX OF 08/08/2008   PULMONARY NODULE 01/10/2008    Cyle Kenyon B. Gerell Fortson, PT 02/09/233:54 PM   [facility] @ [place], [place], [address] Phone: [phone]   Fax:  [phone]  Name: Richard Vaughn MRN: [id number] Date of Birth: 22-Feb-1951

## 2021-12-24 NOTE — Patient Instructions (Signed)
Printed weekly program for patient and wife to follow that included gym routine, walking routine and home program.  Sat with patient and wife and reviewed this and answered any questions.

## 2021-12-31 DIAGNOSIS — R3912 Poor urinary stream: Secondary | ICD-10-CM | POA: Diagnosis not present

## 2021-12-31 DIAGNOSIS — N401 Enlarged prostate with lower urinary tract symptoms: Secondary | ICD-10-CM | POA: Diagnosis not present

## 2021-12-31 DIAGNOSIS — Z125 Encounter for screening for malignant neoplasm of prostate: Secondary | ICD-10-CM | POA: Diagnosis not present

## 2021-12-31 DIAGNOSIS — N5201 Erectile dysfunction due to arterial insufficiency: Secondary | ICD-10-CM | POA: Diagnosis not present

## 2021-12-31 DIAGNOSIS — R351 Nocturia: Secondary | ICD-10-CM | POA: Diagnosis not present

## 2022-01-06 ENCOUNTER — Telehealth: Payer: Self-pay | Admitting: Family Medicine

## 2022-01-06 ENCOUNTER — Other Ambulatory Visit: Payer: Self-pay | Admitting: Gastroenterology

## 2022-01-06 DIAGNOSIS — R6881 Early satiety: Secondary | ICD-10-CM

## 2022-01-06 NOTE — Telephone Encounter (Signed)
Left message for patient to call back and schedule Medicare Annual Wellness Visit (AWV) either virtually or in office. Left  my Richard Vaughn number [phone]   Last AWV 12/09/20   please schedule at anytime with [facility] Nurse Health Advisor 1 or 2   This should be a 45 minute visit.   Verify that medicare is primary and fed bcbs 2nd before scheduling

## 2022-01-11 ENCOUNTER — Ambulatory Visit (INDEPENDENT_AMBULATORY_CARE_PROVIDER_SITE_OTHER): Payer: Medicare Other | Admitting: Gastroenterology

## 2022-01-11 VITALS — BP 102/68 | HR 76 | Ht 74.0 in | Wt 233.8 lb

## 2022-01-11 DIAGNOSIS — Z79899 Other long term (current) drug therapy: Secondary | ICD-10-CM

## 2022-01-11 DIAGNOSIS — Z8673 Personal history of transient ischemic attack (TIA), and cerebral infarction without residual deficits: Secondary | ICD-10-CM

## 2022-01-11 DIAGNOSIS — K5900 Constipation, unspecified: Secondary | ICD-10-CM | POA: Diagnosis not present

## 2022-01-11 DIAGNOSIS — K2270 Barrett's esophagus without dysplasia: Secondary | ICD-10-CM | POA: Diagnosis not present

## 2022-01-11 DIAGNOSIS — I639 Cerebral infarction, unspecified: Secondary | ICD-10-CM

## 2022-01-11 DIAGNOSIS — K219 Gastro-esophageal reflux disease without esophagitis: Secondary | ICD-10-CM

## 2022-01-11 DIAGNOSIS — Z86010 Personal history of colonic polyps: Secondary | ICD-10-CM

## 2022-01-11 MED ORDER — PLENVU 140 G PO SOLR: 1.0000 | Freq: Once | ORAL | 0 refills | Status: AC

## 2022-01-11 MED ORDER — POLYETHYLENE GLYCOL 3350 17 G PO PACK: 17.0000 g | PACK | Freq: Every day | ORAL | 0 refills | Status: DC

## 2022-01-11 NOTE — Progress Notes (Signed)
HPI :  71 year old male here for follow-up visit.  I know him for history of short segment Barrett's esophagus and GERD, also has a history of polyps.  He was last seen in August 2021.  Recall he has a very short segment of Barrett's esophagus noted on EGD in 2017 and stable on most recent EGD in March 2020.  He has been on Nexium longstanding for his reflux.  For the most part he has been taking his Nexium once daily since have last seen him.  That has provided good control for the most part.  In years past he had had some breakthrough been on twice daily dosing in the past but lately mostly once daily.  He denies any dysphagia bothersome.  No abdominal pains.  He does have some constipation at baseline.  Does take oxycodone for chronic back pain, does not take much for his bowels.  He is on about once a day cannot strain.  He denies any blood in his stools. He does have a history of delayed gastric emptying on a remote GES in 2010, although thought that was likely due to narcotic use at the time.   Recall the patient has a history of what he says of 3 strokes in the past.  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  He states is controlled, he is on 81 mg/day aspirin.  He has been recovering from this, he does have some slowed speech.  He states he had a shoulder surgery after he recovered from his last stroke and did well with anesthesia without issues.     Prior workup: EGD 01/29/2016 - 2cm hiatal hernia, irregular z-line, mild gastritis - path c/w BE without dysplasia, stable from last exam, biopsies negative for HP Colonoscopy 12/20/2014 - 2 small adenomas, diverticulosis -  EGD 01/25/2013 - irregular z-line Gastric emptying study 02/19/2009 - delayed gastric emptying MRI abdomen 10/15/2008 - simple renal cysts.    EGD 01/26/19 -  - The Z-line was irregular and was found 41 cm from the incisors, with a few small islands of salmon colored mucosa extending upwards of 1cm above the GEJ. Biopsies  were taken with a cold forceps for histology. - The exam of the esophagus was otherwise normal. - Patchy mildly erythematous mucosa was found in the gastric body and in the gastric antrum. Biopsies were taken with a cold forceps for Helicobacter pylori testing. - The exam of the stomach was otherwise normal. - The duodenal bulb and second portion of the duodenum were normal.     1. Surgical [P], gastric antrum and gastric body - ANTRAL MUCOSA WITH REACTIVE GASTROPATHY. - OXYNTIC MUCOSA WITH MILD HYPEREMIA. - WARTHIN-STARRY STAIN NEGATIVE FOR HELICOBACTER PYLORI. - NO INTESTINAL METAPLASIA, DYSPLASIA OR MALIGNANCY. 2. Surgical [P], distal esophagus - GASTROESOPHAGEAL MUCOSA WITH INTESTINAL METAPLASIA CONSISTENT WITH BARRETT'S ESOPHAGUS. - NO DYSPLASIA OR MALIGNANCY.   Echo 02/23/20 - EF 60-65%, mod LVH    Past Medical History:  Diagnosis Date   Barrett's esophagus    Chronic back pain    Depression    Gastritis    Mild   Gastroparesis    GERD (gastroesophageal reflux disease)    Headache    History of kidney stones    Hyperglycemia    Hypertension    Nephrolithiasis    Pneumonia    Covid pneumonia   Renal cyst    Stricture and stenosis of esophagus    Stroke [facility]) 07-2015   Vision abnormalities  Past Surgical History:  Procedure Laterality Date   CERVICAL [place] SURGERY  2012   COLONOSCOPY     LUMBAR [place] SURGERY  2005, 2010   replaced L4 and L5   SHOULDER ARTHROSCOPY WITH ROTATOR CUFF REPAIR Left 08/14/2020   Procedure: SHOULDER ARTHROSCOPY WITH ROTATOR CUFF REPAIR, SUBACROMIAL DECOMPRESSION;  Surgeon: Tania Ade, MD;  Location: [facility];  Service: Orthopedics;  Laterality: Left;   UPPER GASTROINTESTINAL ENDOSCOPY     Family History  Problem Relation Age of Onset   Hypertension Father    Stroke Father    Hypertension Mother    Liver cancer Mother    Hypertension Sister        2 other sisters has also   Stroke Sister    Stroke Sister    Hypertension  Brother        2nd brother has also   Colon cancer Neg Hx    Esophageal cancer Neg Hx    Rectal cancer Neg Hx    Stomach cancer Neg Hx    Colon polyps Neg Hx    Social History   Tobacco Use   Smoking status: Former    Packs/day: 0.50    Years: 30.00    Pack years: 15.00    Types: Cigarettes    Quit date: 04/15/2018    Years since quitting: 3.7   Smokeless tobacco: Never  Vaping Use   Vaping Use: Never used  Substance Use Topics   Alcohol use: No    Alcohol/week: 0.0 standard drinks   Drug use: Never    Comment: last use 04/2018   Current Outpatient Medications  Medication Sig Dispense Refill   amLODipine (NORVASC) 2.5 MG tablet Take 1 tablet (2.5 mg total) by mouth daily. 90 tablet 3   aspirin EC 81 MG tablet Take 1 tablet (81 mg total) by mouth daily. Swallow whole. 30 tablet 11   esomeprazole (NEXIUM) 40 MG capsule Take 1 capsule (40 mg total) by mouth 2 (two) times daily before a meal. Please keep your appointment on Monday, 2-27 for any further refills. Thank you 60 capsule 0   lovastatin (MEVACOR) 20 MG tablet Take 1 tablet (20 mg total) by mouth at bedtime. 90 tablet 1   Oxycodone HCl 10 MG TABS Take 10 mg by mouth 2 (two) times daily.     traZODone (DESYREL) 50 MG tablet TAKE 1 TABLET AT BEDTIME ASNEEDED FOR SLEEP 90 tablet 2   No current facility-administered medications for this visit.   No Known Allergies   Review of Systems: All systems reviewed and negative except where noted in HPI.   Lab Results  Component Value Date   WBC 5.1 08/07/2020   HGB 13.8 08/07/2020   HCT 41.3 08/07/2020   MCV 91.8 08/07/2020   PLT 161 08/07/2020    Lab Results  Component Value Date   CREATININE 1.07 12/09/2021   BUN 11 12/09/2021   NA 140 12/09/2021   K 4.3 12/09/2021   CL 106 12/09/2021   CO2 27 12/09/2021    Lab Results  Component Value Date   ALT 14 12/09/2021   AST 14 12/09/2021   ALKPHOS 66 12/09/2021   BILITOT 0.8 12/09/2021     Physical Exam: BP  102/68    Pulse 76    Ht 6\' 2"  (1.88 m)    Wt 233 lb 12.8 oz (106.1 kg)    BMI 30.02 kg/m  Constitutional: Pleasant,well-developed, male in no acute distress. Neurological: Alert and oriented to person place  and time.Marland Kitchen Psychiatric: Normal mood and affect. Behavior is normal.   ASSESSMENT AND PLAN: 71 year old male here for reassessment of the following:  Barrett's esophagus GERD Long-term PPI use Chronic constipation History of colon polyps History of stroke  Discussed his history of Barrett's esophagus and reflux.  He is doing well on present dosing of Nexium and would continue present dosing for now.  We discussed long-term risk of chronic PPI use and he and his wife understand this, I think benefits outweigh risks given his history of Barrett's.  We discussed how frequently to do surveillance EGD.  He has a stable segment of short segment Barrett's, mostly small islands.  Overall seems to be a low risk lesion.  Want to minimize the amount of procedures he needs to go through given his history of stroke.  I think okay to extend his next EGD surveillance to 5 years from his last exam, so we will change his recall to March 2025, he is agreeable to that.  We otherwise discussed his constipation.  Recommend using MiraLAX daily and can titrate up as needed, likely some component of opioid induced constipation.  We discussed if he wanted to have any further surveillance colonoscopies as he is due for surveillance exam given history of polyps on colonoscopy 7 years ago.  We discussed risks benefits of colonoscopy and anesthesia, he feels like he is stable this far out from his stroke at present time and doing well, wishes to proceed with colonoscopy.  If no polyps on this exam or high risk lesions, this may be his last exam, given his age and comorbidities.  Plan: - continue nexium - change recall for EGD to 01/2024 - start Miralax and titrate up as needed - schedule surveillance Colonoscopy -  stable from neurologic standpoint  Jolly Mango, MD [facility]

## 2022-01-11 NOTE — Patient Instructions (Addendum)
If you are age 69 or older, your body mass index should be between 23-30. Your Body mass index is 30.02 kg/m. If this is out of the aforementioned range listed, please consider follow up with your Primary Care Provider.  If you are age 15 or younger, your body mass index should be between 19-25. Your Body mass index is 30.02 kg/m. If this is out of the aformentioned range listed, please consider follow up with your Primary Care Provider.   ________________________________________________________  The [facility] providers would like to encourage you to use [facility] to communicate with providers for non-urgent requests or questions.  Due to long hold times on the telephone, sending your provider a message by [facility] may be a faster and more efficient way to get a response.  Please allow 48 business hours for a response.  Please remember that this is for non-urgent requests.  _______________________________________________________  Richard Vaughn have been scheduled for a colonoscopy. Please follow written instructions given to you at your visit today.  Please pick up your prep supplies at the pharmacy within the next 1-3 days. If you use inhalers (even only as needed), please bring them with you on the day of your procedure.  Continue Nexium.  Please purchase the following medications over the counter and take as directed: Miralax: Take every day   You will be due for a recall Endoscopy in 01-2024. We will send you a reminder in the mail when it gets closer to that time.  Thank you for entrusting me with your care and for choosing [facility], Dr. Beckett Ridge Cellar

## 2022-01-27 ENCOUNTER — Other Ambulatory Visit: Payer: Self-pay | Admitting: Family Medicine

## 2022-01-27 DIAGNOSIS — E785 Hyperlipidemia, unspecified: Secondary | ICD-10-CM

## 2022-01-28 ENCOUNTER — Telehealth: Payer: Self-pay

## 2022-01-28 NOTE — Telephone Encounter (Signed)
Patient having chills for a week ?

## 2022-01-28 NOTE — Telephone Encounter (Signed)
Left a message for the pt to return call to the office to schedule visit with Dr. Maudie Mercury  ?

## 2022-01-28 NOTE — Telephone Encounter (Signed)
I spoke with the pt and offered visit with Dr. Maudie Mercury today pt declined at this time and stated he would keep his appt tomorrow with PCP. I advised pt that if sx worsen or he I having any SOB, Chest pain/pressure to visit the UC.  ?

## 2022-01-29 ENCOUNTER — Encounter: Payer: Self-pay | Admitting: Family Medicine

## 2022-01-29 ENCOUNTER — Other Ambulatory Visit: Payer: Self-pay | Admitting: Gastroenterology

## 2022-01-29 ENCOUNTER — Ambulatory Visit (INDEPENDENT_AMBULATORY_CARE_PROVIDER_SITE_OTHER): Payer: Medicare Other | Admitting: Family Medicine

## 2022-01-29 VITALS — BP 110/70 | HR 74 | Temp 98.4°F | Resp 16 | Ht 74.0 in | Wt 230.5 lb

## 2022-01-29 DIAGNOSIS — I10 Essential (primary) hypertension: Secondary | ICD-10-CM

## 2022-01-29 DIAGNOSIS — K5900 Constipation, unspecified: Secondary | ICD-10-CM

## 2022-01-29 DIAGNOSIS — I639 Cerebral infarction, unspecified: Secondary | ICD-10-CM | POA: Diagnosis not present

## 2022-01-29 DIAGNOSIS — R6881 Early satiety: Secondary | ICD-10-CM

## 2022-01-29 DIAGNOSIS — R110 Nausea: Secondary | ICD-10-CM | POA: Diagnosis not present

## 2022-01-29 NOTE — Patient Instructions (Addendum)
A few things to remember from today's visit: ? ?Constipation, unspecified constipation type - Plan: CBC, TSH, BMP with eGFR([facility]) ? ?Essential hypertension ? ?Nausea without vomiting ? ?If you need refills please call your pharmacy. ?Do not use My Chart to request refills or for acute issues that need immediate attention. ?  ?Adequate hydration. ?Continue monitoring blood pressure. ?Benefiber 1 tsp 2 times daily. ?Dulcolax 5 mg at bedtime and Miralax at bedtime every other day. ? ?Prescription option of over the counter meds do not help would be Linzess. ? ?Please be sure medication list is accurate. ?If a new problem present, please set up appointment sooner than planned today. ? ? ? ? ? ? ? ?

## 2022-01-29 NOTE — Progress Notes (Signed)
? ?ACUTE VISIT ?Chief Complaint  ?Patient presents with  ? Chills  ?  X a week, having nausea and constipation.   ? ?HPI: ?[name] is a 71 y.o. male with hx of HTN,CVA,HLD,insomnia, GERD, and chronic pain here today  with his wife with above complaints. ?A week of nausea, intermittent. ?He is not sure about exacerbating or alleviating factors; it is not affected by oral intake. ?He has not noted heartburn. ? ?Negative for associated fever,cough,SOB,CP,abdominal pain,vomiting,or urinary symptoms. ?No sick contact. ? ?Last bowel movement 3 days ago after a week of not having one, felt better. ?He just started Miralax, tried Dulcolax before. ?He has not noted blood in stool or melena. ?On chronic opioid treatment, about a month ago changed from Oxymorphone to Oxycodone. ? ?Lab Results  ?Component Value Date  ? TSH 1.22 03/20/2015  ? ?BP on lower normal range. Wife reporting BP's 90's/60's, he discontinued Amlodipine 2.5 mg a few days ago. ?BP has been in the low 100's/60's. ? ?Lab Results  ?Component Value Date  ? CREATININE 1.07 12/09/2021  ? BUN 11 12/09/2021  ? NA 140 12/09/2021  ? K 4.3 12/09/2021  ? CL 106 12/09/2021  ? CO2 27 12/09/2021  ? ?Review of Systems  ?Constitutional:  Positive for fatigue. Negative for activity change and appetite change.  ?HENT:  Negative for mouth sores, sore throat and trouble swallowing.   ?Respiratory:  Negative for cough and wheezing.   ?Cardiovascular:  Negative for palpitations and leg swelling.  ?Endocrine: Negative for cold intolerance and heat intolerance.  ?Genitourinary:  Negative for decreased urine volume, dysuria and hematuria.  ?Musculoskeletal:  Positive for arthralgias, back pain and gait problem.  ?Skin:  Negative for rash.  ?Neurological:  Negative for syncope and headaches.  ?Psychiatric/Behavioral:  Negative for confusion.   ?Rest see pertinent positives and negatives per HPI. ? ?Current Outpatient Medications on File Prior to Visit  ?Medication Sig  Dispense Refill  ? aspirin EC 81 MG tablet Take 1 tablet (81 mg total) by mouth daily. Swallow whole. 30 tablet 11  ? lovastatin (MEVACOR) 20 MG tablet TAKE 1 TABLET BY MOUTH EVERYDAY AT BEDTIME 90 tablet 1  ? Oxycodone HCl 10 MG TABS Take 10 mg by mouth 2 (two) times daily.    ? polyethylene glycol (MIRALAX) 17 g packet Take 17 g by mouth daily. 14 each 0  ? traZODone (DESYREL) 50 MG tablet TAKE 1 TABLET AT BEDTIME ASNEEDED FOR SLEEP 90 tablet 2  ? ?No current facility-administered medications on file prior to visit.  ? ?Past Medical History:  ?Diagnosis Date  ? Barrett's esophagus   ? Chronic back pain   ? Depression   ? Gastritis   ? Mild  ? Gastroparesis   ? GERD (gastroesophageal reflux disease)   ? Headache   ? History of kidney stones   ? Hyperglycemia   ? Hypertension   ? Nephrolithiasis   ? Pneumonia   ? Covid pneumonia  ? Renal cyst   ? Stricture and stenosis of esophagus   ? Stroke [facility] 07-2015  ? Vision abnormalities   ? ?No Known Allergies ? ?Social History  ? ?Socioeconomic History  ? Marital status: Married  ?  Spouse name: Marvelous Bouwens  ? Number of children: 3  ? Years of education: Not on file  ? Highest education level: Not on file  ?Occupational History  ? Occupation: retired  ?  Employer: [employer]  ?Tobacco Use  ? Smoking status: Former  ?  Packs/day: 0.50  ?  Years: 30.00  ?  Pack years: 15.00  ?  Types: Cigarettes  ?  Quit date: 04/15/2018  ?  Years since quitting: 3.7  ? Smokeless tobacco: Never  ?Vaping Use  ? Vaping Use: Never used  ?Substance and Sexual Activity  ? Alcohol use: No  ?  Alcohol/week: 0.0 standard drinks  ? Drug use: Never  ?  Comment: last use 04/2018  ? Sexual activity: Yes  ?Other Topics Concern  ? Not on file  ?Social History Narrative  ? Not on file  ? ?Social Determinants of Health  ? ?Financial Resource Strain: Not on file  ?Food Insecurity: Not on file  ?Transportation Needs: Not on file  ?Physical Activity: Not on file  ?Stress: Not on file  ?Social Connections:  Not on file  ? ?Vitals:  ? 01/29/22 1425  ?BP: 110/70  ?Pulse: 74  ?Resp: 16  ?Temp: 98.4 ?F (36.9 ?C)  ?SpO2: 98%  ? ?Body mass index is 29.59 kg/m?. ? ?Physical Exam ?Vitals and nursing note reviewed.  ?Constitutional:   ?   General: He is not in acute distress. ?   Appearance: He is well-developed.  ?HENT:  ?   Head: Normocephalic and atraumatic.  ?   Mouth/Throat:  ?   Mouth: Mucous membranes are moist.  ?   Dentition: Has dentures.  ?   Pharynx: Oropharynx is clear.  ?Eyes:  ?   Conjunctiva/sclera: Conjunctivae normal.  ?Cardiovascular:  ?   Rate and Rhythm: Normal rate and regular rhythm.  ?   Heart sounds: No murmur heard. ?   Comments: Trace pitting LE edema, bilateral. ?Pulmonary:  ?   Effort: Pulmonary effort is normal. No respiratory distress.  ?   Breath sounds: Normal breath sounds.  ?Abdominal:  ?   Palpations: Abdomen is soft. There is no mass.  ?   Tenderness: There is no abdominal tenderness.  ?Lymphadenopathy:  ?   Cervical: No cervical adenopathy.  ?Skin: ?   General: Skin is warm.  ?   Findings: No erythema or rash.  ?Neurological:  ?   Mental Status: He is alert and oriented to person, place, and time.  ?   Cranial Nerves: No cranial nerve deficit.  ?   Comments: Left-sided weakness. ?Dysarthria. ?Unstable gait assisted by a cane.  ? ?ASSESSMENT AND PLAN: ? ?[name] was seen today for chills. ? ?Diagnoses and all orders for this visit: ?Orders Placed This Encounter  ?Procedures  ? CBC  ? TSH  ? BMP with eGFR([facility])  ? ?Lab Results  ?Component Value Date  ? CREATININE 0.99 01/29/2022  ? BUN 9 01/29/2022  ? NA 143 01/29/2022  ? K 4.4 01/29/2022  ? CL 106 01/29/2022  ? CO2 24 01/29/2022  ? ?Lab Results  ?Component Value Date  ? WBC 5.1 01/29/2022  ? HGB 14.1 01/29/2022  ? HCT 42.5 01/29/2022  ? MCV 91.6 01/29/2022  ? PLT 190 01/29/2022  ? ?Lab Results  ?Component Value Date  ? TSH 1.12 01/29/2022  ? ?Constipation, unspecified constipation type ?Problem could be aggravated or caused by  opioid. ?Continue Miralax and dilcolax 5 mg every other day at bedtime. ?Adequate hydration and fiber intake. ?If problem is persistent, we can consider Linzess. ?Instructed about warning signs. ? ?Nausea without vomiting ?Mild. ?Adequate hydration. ?Instructed about warning signs. ?Monitor for new symptoms. ? ?Essential hypertension ?BP has been running mildly low.  ?Continue non pharmacologic treatment. ?Continue monitoring BP at home. ? ?Return  if symptoms worsen or fail to improve, for Keep next appt. ? ? ?Kylynn Street G. Martin[place], MD ? ?[place]. ?[place] office. ? ?

## 2022-01-30 ENCOUNTER — Encounter: Payer: Self-pay | Admitting: Family Medicine

## 2022-01-30 LAB — BASIC METABOLIC PANEL WITH GFR
BUN: 9 mg/dL (ref 7–25)
CO2: 24 mmol/L (ref 20–32)
Calcium: 9.5 mg/dL (ref 8.6–10.3)
Chloride: 106 mmol/L (ref 98–110)
Creat: 0.99 mg/dL (ref 0.70–1.35)
Glucose, Bld: 64 mg/dL — ABNORMAL LOW (ref 65–99)
Potassium: 4.4 mmol/L (ref 3.5–5.3)
Sodium: 143 mmol/L (ref 135–146)
eGFR: 83 mL/min/{1.73_m2} (ref >=60)

## 2022-01-30 LAB — CBC
HCT: 42.5 % (ref 38.5–50.0)
Hemoglobin: 14.1 g/dL (ref 13.2–17.1)
MCH: 30.4 pg (ref 27.0–33.0)
MCHC: 33.2 g/dL (ref 32.0–36.0)
MCV: 91.6 fL (ref 80.0–100.0)
MPV: 10.5 fL (ref 7.5–12.5)
Platelets: 190 10*3/uL (ref 140–400)
RBC: 4.64 10*6/uL (ref 4.20–5.80)
RDW: 12.7 % (ref 11.0–15.0)
WBC: 5.1 10*3/uL (ref 3.8–10.8)

## 2022-01-30 LAB — TSH: TSH: 1.12 mIU/L (ref 0.40–4.50)

## 2022-02-16 ENCOUNTER — Encounter: Payer: Self-pay | Admitting: Podiatry

## 2022-02-16 ENCOUNTER — Ambulatory Visit (INDEPENDENT_AMBULATORY_CARE_PROVIDER_SITE_OTHER): Payer: Medicare Other | Admitting: Podiatry

## 2022-02-16 DIAGNOSIS — M79674 Pain in right toe(s): Secondary | ICD-10-CM

## 2022-02-16 DIAGNOSIS — I639 Cerebral infarction, unspecified: Secondary | ICD-10-CM

## 2022-02-16 DIAGNOSIS — I739 Peripheral vascular disease, unspecified: Secondary | ICD-10-CM

## 2022-02-16 DIAGNOSIS — M79675 Pain in left toe(s): Secondary | ICD-10-CM | POA: Diagnosis not present

## 2022-02-16 DIAGNOSIS — B351 Tinea unguium: Secondary | ICD-10-CM | POA: Diagnosis not present

## 2022-02-16 NOTE — Progress Notes (Signed)
This patient returns to my office for at risk foot care.  This patient requires this care by a professional since this patient will be at risk due to having  CVA, PAD and hyperglycemia. He presents to the office with his wife.  This patient is unable to cut nails himself since the patient cannot reach his nails.These nails are painful walking and wearing shoes.  This patient presents for at risk foot care today. ? ?General Appearance  Alert, conversant and in no acute stress. ? ?Vascular  Dorsalis pedis and posterior tibial  pulses are palpable  bilaterally.  Capillary return is within normal limits  bilaterally. Temperature is within normal limits  bilaterally. ? ?Neurologic  Senn-Weinstein monofilament wire test within normal limits  bilaterally. Muscle power within normal limits bilaterally. ? ?Nails Thick disfigured discolored nails with subungual debris  from hallux to fifth toes bilaterally. No evidence of bacterial infection or drainage bilaterally. ? ?Orthopedic  No limitations of motion  feet .  No crepitus or effusions noted.  Hammer toes  B/l. ? ?Skin  normotropic skin with no porokeratosis noted bilaterally.  No signs of infections or ulcers noted.    ? ?Onychomycosis  Pain in right toes  Pain in left toes ? ?Consent was obtained for treatment procedures.   Mechanical debridement of nails 1-5  bilaterally performed with a nail nipper.  Filed with dremel without incident.  ? ? ?Return office visit    3 months                  Told patient to return for periodic foot care and evaluation due to potential at risk complications. ? ? ?Gardiner Barefoot DPM   ?

## 2022-02-17 ENCOUNTER — Ambulatory Visit (AMBULATORY_SURGERY_CENTER): Payer: Medicare Other | Admitting: Gastroenterology

## 2022-02-17 ENCOUNTER — Encounter: Payer: Self-pay | Admitting: Gastroenterology

## 2022-02-17 VITALS — BP 116/72 | HR 55 | Temp 97.8°F | Resp 14 | Ht 74.0 in | Wt 233.0 lb

## 2022-02-17 DIAGNOSIS — D123 Benign neoplasm of transverse colon: Secondary | ICD-10-CM | POA: Diagnosis not present

## 2022-02-17 DIAGNOSIS — D120 Benign neoplasm of cecum: Secondary | ICD-10-CM

## 2022-02-17 DIAGNOSIS — D122 Benign neoplasm of ascending colon: Secondary | ICD-10-CM | POA: Diagnosis not present

## 2022-02-17 DIAGNOSIS — Z86010 Personal history of colonic polyps: Secondary | ICD-10-CM | POA: Diagnosis not present

## 2022-02-17 MED ORDER — SODIUM CHLORIDE 0.9 % IV SOLN: 500.0000 mL | Freq: Once | INTRAVENOUS | Status: DC

## 2022-02-17 NOTE — Progress Notes (Signed)
Called to room to assist during endoscopic procedure.  Patient ID and intended procedure confirmed with present staff. Received instructions for my participation in the procedure from the performing physician.  

## 2022-02-17 NOTE — Progress Notes (Signed)
Pt non-responsive, VVS, Report to RN  °

## 2022-02-17 NOTE — Patient Instructions (Signed)
Handouts given on diverticulosis, polyps, and hemorrhoids. ?Await pathology results. ?Resume previous diet and continue present medications. ?Repeat colonoscopy for surveillance will be determined based off pathology results. ? ? ?YOU HAD AN ENDOSCOPIC PROCEDURE TODAY AT [facility]:   Refer to the procedure report that was given to you for any specific questions about what was found during the examination.  If the procedure report does not answer your questions, please call your gastroenterologist to clarify.  If you requested that your care partner not be given the details of your procedure findings, then the procedure report has been included in a sealed envelope for you to review at your convenience later. ? ?YOU SHOULD EXPECT: Some feelings of bloating in the abdomen. Passage of more gas than usual.  Walking can help get rid of the air that was put into your GI tract during the procedure and reduce the bloating. If you had a lower endoscopy (such as a colonoscopy or flexible sigmoidoscopy) you may notice spotting of blood in your stool or on the toilet paper. If you underwent a bowel prep for your procedure, you may not have a normal bowel movement for a few days. ? ?Please Note:  You might notice some irritation and congestion in your nose or some drainage.  This is from the oxygen used during your procedure.  There is no need for concern and it should clear up in a day or so. ? ?SYMPTOMS TO REPORT IMMEDIATELY: ? ?Following lower endoscopy (colonoscopy or flexible sigmoidoscopy): ? Excessive amounts of blood in the stool ? Significant tenderness or worsening of abdominal pains ? Swelling of the abdomen that is new, acute ? Fever of 100?F or higher ? ?For urgent or emergent issues, a gastroenterologist can be reached at any hour by calling [phone]. ?Do not use MyChart messaging for urgent concerns.  ? ? ?DIET:  We do recommend a small meal at first, but then you may proceed to your  regular diet.  Drink plenty of fluids but you should avoid alcoholic beverages for 24 hours. ? ?ACTIVITY:  You should plan to take it easy for the rest of today and you should NOT DRIVE or use heavy machinery until tomorrow (because of the sedation medicines used during the test).   ? ?FOLLOW UP: ?Our staff will call the number listed on your records 48-72 hours following your procedure to check on you and address any questions or concerns that you may have regarding the information given to you following your procedure. If we do not reach you, we will leave a message.  We will attempt to reach you two times.  During this call, we will ask if you have developed any symptoms of COVID 19. If you develop any symptoms (ie: fever, flu-like symptoms, shortness of breath, cough etc.) before then, please call [phone].  If you test positive for Covid 19 in the 2 weeks post procedure, please call and report this information to [place].   ? ?If any biopsies were taken you will be contacted by phone or by letter within the next 1-3 weeks.  Please call us at [phone] if you have not heard about the biopsies in 3 weeks.  ? ? ?SIGNATURES/CONFIDENTIALITY: ?You and/or your care partner have signed paperwork which will be entered into your electronic medical record.  These signatures attest to the fact that that the information above on your After Visit Summary has been reviewed and is understood.  Full responsibility of the confidentiality  of this discharge information lies with you and/or your care-partner.  ?

## 2022-02-17 NOTE — Op Note (Signed)
[facility] ?Patient Name: Richard Vaughn ?Procedure Date: 02/17/2022 10:23 AM ?MRN: [id number] ?Endoscopist: Carlota Raspberry. [name] , MD ?Age: 71 ?Referring MD:  ?Date of Birth: Oct 13, 1951 ?Gender: Male ?Account #: [id number] ?Procedure:                Colonoscopy ?Indications:              High risk colon cancer surveillance: Personal  ?                          history of colonic polyps - 12/2014 - 2 small  ?                          adenomas ?Medicines:                Monitored Anesthesia Care ?Procedure:                Pre-Anesthesia Assessment: ?                          - Prior to the procedure, a History and Physical  ?                          was performed, and patient medications and  ?                          allergies were reviewed. The patient's tolerance of  ?                          previous anesthesia was also reviewed. The risks  ?                          and benefits of the procedure and the sedation  ?                          options and risks were discussed with the patient.  ?                          All questions were answered, and informed consent  ?                          was obtained. Prior Anticoagulants: The patient has  ?                          taken no previous anticoagulant or antiplatelet  ?                          agents. ASA Grade Assessment: III - A patient with  ?                          severe systemic disease. After reviewing the risks  ?                          and benefits, the patient was deemed in  ?  satisfactory condition to undergo the procedure. ?                          After obtaining informed consent, the colonoscope  ?                          was passed under direct vision. Throughout the  ?                          procedure, the patient's blood pressure, pulse, and  ?                          oxygen saturations were monitored continuously. The  ?                          Olympus CF-[id number] (#[id number]) Colonoscope was  ?                           introduced through the anus and advanced to the the  ?                          cecum, identified by appendiceal orifice and  ?                          ileocecal valve. The colonoscopy was performed  ?                          without difficulty. The patient tolerated the  ?                          procedure well. The quality of the bowel  ?                          preparation was adequate. The ileocecal valve,  ?                          appendiceal orifice, and rectum were photographed. ?Scope In: 10:29:43 AM ?Scope Out: 10:44:20 AM ?Scope Withdrawal Time: 0 hours 13 minutes 19 seconds  ?Total Procedure Duration: 0 hours 14 minutes 37 seconds  ?Findings:                 The perianal and digital rectal examinations were  ?                          normal. ?                          Two flat polyps were found in the cecum. The polyps  ?                          were 2 to 3 mm in size. These polyps were removed  ?                          with a cold snare. Resection and retrieval were  ?  complete. ?                          A 3 mm polyp was found in the ascending colon. The  ?                          polyp was sessile. The polyp was removed with a  ?                          cold snare. Resection and retrieval were complete. ?                          A 3 mm polyp was found in the transverse colon. The  ?                          polyp was sessile. The polyp was removed with a  ?                          cold snare. Resection and retrieval were complete. ?                          Many small-mouthed diverticula were found in the  ?                          sigmoid colon. ?                          Internal hemorrhoids were found during retroflexion. ?                          The exam was otherwise without abnormality. ?Complications:            No immediate complications. Estimated blood loss:  ?                          Minimal. ?Estimated Blood Loss:     Estimated  blood loss was minimal. ?Impression:               - Two 2 to 3 mm polyps in the cecum, removed with a  ?                          cold snare. Resected and retrieved. ?                          - One 3 mm polyp in the ascending colon, removed  ?                          with a cold snare. Resected and retrieved. ?                          - One 3 mm polyp in the transverse colon, removed  ?                          with a cold snare. Resected and retrieved. ?                          -  Diverticulosis in the sigmoid colon. ?                          - Internal hemorrhoids. ?                          - The examination was otherwise normal. ?Recommendation:           - Patient has a contact number available for  ?                          emergencies. The signs and symptoms of potential  ?                          delayed complications were discussed with the  ?                          patient. Return to normal activities tomorrow.  ?                          Written discharge instructions were provided to the  ?                          patient. ?                          - Resume previous diet. ?                          - Continue present medications. ?                          - Await pathology results. ?Carlota Raspberry. [name], MD ?02/17/2022 10:47:56 AM ?This report has been signed electronically. ?

## 2022-02-17 NOTE — Progress Notes (Signed)
[facility] History and Physical ? ? ?Primary Care Physician:  Martinique, [name], MD ? ? ?Reason for Procedure:   History of colon polyps ? ?Plan:    colonoscopy ? ? ? ? ?HPI: Richard Vaughn is a 71 y.o. male  here for colonoscopy surveillance - 2 adenomas removed in 2016. Patient denies any bowel symptoms at this time other than constipation, on Miralax. Otherwise feels well without any cardiopulmonary symptoms. He wishes to proceed after discussion of risks / benefits. ? ? ?Past Medical History:  ?Diagnosis Date  ? Barrett's esophagus   ? Chronic back pain   ? Depression   ? Gastritis   ? Mild  ? Gastroparesis   ? GERD (gastroesophageal reflux disease)   ? Headache   ? History of kidney stones   ? Hyperglycemia   ? Hypertension   ? Nephrolithiasis   ? Pneumonia   ? Covid pneumonia  ? Renal cyst   ? Sleep apnea   ? Stricture and stenosis of esophagus   ? Stroke [facility]) 07/2015  ? Vision abnormalities   ? ? ?Past Surgical History:  ?Procedure Laterality Date  ? [place] SURGERY  2012  ? COLONOSCOPY    ? [place] SURGERY  2005, 2010  ? replaced L4 and L5  ? SHOULDER ARTHROSCOPY WITH ROTATOR CUFF REPAIR Left 08/14/2020  ? Procedure: SHOULDER ARTHROSCOPY WITH ROTATOR CUFF REPAIR, SUBACROMIAL DECOMPRESSION;  Surgeon: Tania Ade, MD;  Location: [facility];  Service: Orthopedics;  Laterality: Left;  ? UPPER GASTROINTESTINAL ENDOSCOPY    ? ? ?Prior to Admission medications   ?Medication Sig Start Date End Date Taking? Authorizing Provider  ?aspirin EC 81 MG tablet Take 1 tablet (81 mg total) by mouth daily. Swallow whole. 06/10/20  Yes Frann Rider, NP  ?esomeprazole (NEXIUM) 40 MG capsule Take 1 capsule (40 mg total) by mouth 2 (two) times daily before a meal. 01/29/22  Yes Cyndra Feinberg, Carlota Raspberry, MD  ?lovastatin (MEVACOR) 20 MG tablet TAKE 1 TABLET BY MOUTH EVERYDAY AT BEDTIME 01/27/22  Yes Martinique, [name], MD  ?Oxycodone HCl 10 MG TABS Take 10 mg by mouth 2 (two) times daily. 01/06/22  Yes [provider]  ?traZODone (DESYREL) 50 MG tablet TAKE 1 TABLET AT BEDTIME ASNEEDED FOR SLEEP 09/21/21  Yes Martinique, [name], MD  ?polyethylene glycol (MIRALAX) 17 g packet Take 17 g by mouth daily. ?Patient not taking: Reported on 02/17/2022 01/11/22   Marvon Shillingburg, Carlota Raspberry, MD  ? ? ?Current Outpatient Medications  ?Medication Sig Dispense Refill  ? aspirin EC 81 MG tablet Take 1 tablet (81 mg total) by mouth daily. Swallow whole. 30 tablet 11  ? esomeprazole (NEXIUM) 40 MG capsule Take 1 capsule (40 mg total) by mouth 2 (two) times daily before a meal. 60 capsule 5  ? lovastatin (MEVACOR) 20 MG tablet TAKE 1 TABLET BY MOUTH EVERYDAY AT BEDTIME 90 tablet 1  ? Oxycodone HCl 10 MG TABS Take 10 mg by mouth 2 (two) times daily.    ? traZODone (DESYREL) 50 MG tablet TAKE 1 TABLET AT BEDTIME ASNEEDED FOR SLEEP 90 tablet 2  ? polyethylene glycol (MIRALAX) 17 g packet Take 17 g by mouth daily. (Patient not taking: Reported on 02/17/2022) 14 each 0  ? ?Current Facility-Administered Medications  ?Medication Dose Route Frequency Provider Last Rate Last Admin  ? 0.9 %  sodium chloride infusion  500 mL Intravenous Once Callie Facey, Carlota Raspberry, MD      ? ? ?Allergies as of  02/17/2022  ? (No Known Allergies)  ? ? ?Family History  ?Problem Relation Age of Onset  ? Hypertension Father   ? Stroke Father   ? Hypertension Mother   ? Liver cancer Mother   ? Hypertension Sister   ?     2 other sisters has also  ? Stroke Sister   ? Stroke Sister   ? Hypertension Brother   ?     2nd brother has also  ? Colon cancer Neg Hx   ? Esophageal cancer Neg Hx   ? Rectal cancer Neg Hx   ? Stomach cancer Neg Hx   ? Colon polyps Neg Hx   ? ? ?Social History  ? ?Socioeconomic History  ? Marital status: Married  ?  Spouse name: Gerold Sar  ? Number of children: 3  ? Years of education: Not on file  ? Highest education level: Not on file  ?Occupational History  ? Occupation: retired  ?  Employer: [employer]  ?Tobacco Use  ? Smoking status: Former  ?   Packs/day: 0.50  ?  Years: 30.00  ?  Pack years: 15.00  ?  Types: Cigarettes  ?  Quit date: 04/15/2018  ?  Years since quitting: 3.8  ? Smokeless tobacco: Never  ?Vaping Use  ? Vaping Use: Never used  ?Substance and Sexual Activity  ? Alcohol use: No  ?  Alcohol/week: 0.0 standard drinks  ? Drug use: Never  ?  Comment: last use 04/2018  ? Sexual activity: Yes  ?Other Topics Concern  ? Not on file  ?Social History Narrative  ? Not on file  ? ?Social Determinants of Health  ? ?Financial Resource Strain: Not on file  ?Food Insecurity: Not on file  ?Transportation Needs: Not on file  ?Physical Activity: Not on file  ?Stress: Not on file  ?Social Connections: Not on file  ?Intimate Partner Violence: Not on file  ? ? ?Review of Systems: ?All other review of systems negative except as mentioned in the HPI. ? ?Physical Exam: ?Vital signs ?BP 126/82   Pulse 68   Temp 97.8 ?F (36.6 ?C) (Temporal)   Ht '6\' 2"'$  (1.88 m)   Wt 233 lb (105.7 kg)   SpO2 97%   BMI 29.92 kg/m?  ? ?General:   Alert,  Well-developed, pleasant and cooperative in NAD ?Lungs:  Clear throughout to auscultation.   ?Heart:  Regular rate and rhythm ?Abdomen:  Soft, nontender and nondistended.   ?Neuro/Psych:  Alert and cooperative. Normal mood and affect. A and O x 3 ? ?Jolly Mango, MD ?[facility] ? ? ?

## 2022-02-18 ENCOUNTER — Telehealth: Payer: Self-pay | Admitting: Family Medicine

## 2022-02-18 NOTE — Telephone Encounter (Signed)
Left message for patient to call back and schedule Medicare Annual Wellness Visit (AWV) either virtually or in office. Left  my Herbie Drape number [phone] ? ? ?Last AWV 12/09/20 ? please schedule at anytime with [facility] Nurse Health Advisor 1 or 2 ? ? ? ?

## 2022-02-22 ENCOUNTER — Telehealth: Payer: Self-pay

## 2022-02-22 NOTE — Telephone Encounter (Signed)
Unable to leave message line is busy. ?

## 2022-03-16 ENCOUNTER — Telehealth: Payer: Self-pay | Admitting: Family Medicine

## 2022-03-16 NOTE — Telephone Encounter (Signed)
Left message for patient to call back and schedule Medicare Annual Wellness Visit (AWV) either virtually or in office. Left  my Herbie Drape number [phone] ? ? ?Last AWV ;12/09/20 ?please schedule at anytime with [facility] Nurse Health Advisor 1 or 2 ? ? ? ?

## 2022-04-06 ENCOUNTER — Ambulatory Visit (HOSPITAL_COMMUNITY)
Admission: RE | Admit: 2022-04-06 | Discharge: 2022-04-06 | Disposition: A | Discharge status: Home / Self Care | Payer: Medicare Other | Source: Ambulatory Visit | Attending: Internal Medicine | Admitting: Internal Medicine

## 2022-04-06 ENCOUNTER — Encounter (HOSPITAL_COMMUNITY): Payer: Self-pay

## 2022-04-06 VITALS — BP 117/82 | HR 81 | Temp 98.8°F | Resp 18

## 2022-04-06 DIAGNOSIS — K5903 Drug induced constipation: Secondary | ICD-10-CM

## 2022-04-06 LAB — POCT URINALYSIS DIPSTICK, ED / UC
Bilirubin Urine: NEGATIVE
Glucose, UA: NEGATIVE mg/dL
Ketones, ur: NEGATIVE mg/dL
Leukocytes,Ua: NEGATIVE
Nitrite: NEGATIVE
Protein, ur: NEGATIVE mg/dL
Specific Gravity, Urine: 1.02 (ref 1.005–1.030)
Urobilinogen, UA: 0.2 mg/dL (ref 0.0–1.0)
pH: 5.5 (ref 5.0–8.0)

## 2022-04-06 MED ORDER — ACETAMINOPHEN 325 MG PO TABS
650.0000 mg | ORAL_TABLET | Freq: Once | ORAL | Status: AC
  Administered 2022-04-06: 650 mg via ORAL

## 2022-04-06 MED ORDER — ACETAMINOPHEN 325 MG PO TABS
ORAL_TABLET | ORAL | Status: AC
  Filled 2022-04-06: qty 2

## 2022-04-06 MED ORDER — POLYETHYLENE GLYCOL 3350 17 GM/SCOOP PO POWD: 1.0000 | Freq: Once | ORAL | 0 refills | Status: AC

## 2022-04-06 NOTE — Discharge Instructions (Signed)
You were seen in urgent care today for constipation and abdominal pain.  I would like for you to take MiraLAX twice daily for the next few days until you have a bowel movement.  After you have a bowel movement, you may decrease your MiraLAX usage to once per day for the next week, then use as needed after that.  Please take your Colace stool softener daily to prevent constipation while taking opioid medications for chronic back pain.  If you have not had a bowel movement in the next 2 to 3 days, you need to return to urgent care or go to the emergency department.  Increase your fluid intake and eat plenty of fruits, vegetables, and whole grains to increase your fiber intake.   If you develop any new or worsening symptoms or do not improve in the next 2 to 3 days, please return.  If your symptoms are severe, please go to the emergency room.  Follow-up with your primary care provider for further evaluation and management of your symptoms as well as ongoing wellness visits.  I hope you feel better!

## 2022-04-06 NOTE — ED Triage Notes (Signed)
Pt states lower abd pain worse when he urinates started yesterday. Denies N/V/D.

## 2022-04-06 NOTE — ED Provider Notes (Signed)
[place]    CSN: [id number] Arrival date & time: 04/06/22  1642      History   Chief Complaint No chief complaint on file.   HPI Richard Vaughn is a 71 y.o. male.   Patient presents to urgent care for evaluation of pain with urination and constipation for the last 2 days.  He states that he has had a recent change in his medication for his back pain from hydromorphone to hydrocodone.  He has been prescribed Colace in the past to be taken daily for constipation prevention, but he has not taken this daily lately.  States that when he tries to go to the bathroom to defecate, he feels like "he cannot get his poop out and it is stuck".  Abdominal pain is to the right and left lower quadrants and he currently rates his pain an 8 on a scale of 0-10.  He has not taken any medications for his constipation at home or his abdominal pain.  Also reporting abdominal pain with urination.  He denies burning with urination and hesitancy, but does report urinary urgency.  Significant past medical history of a stroke last year with residual neurologic deficits present.  Denies nausea, vomiting, diarrhea, fever, headache, rectal pain, rectal bleeding, and rash. No other aggravating or relieving factors for symptoms identified.     Past Medical History:  Diagnosis Date   Barrett's esophagus    Chronic back pain    Depression    Gastritis    Mild   Gastroparesis    GERD (gastroesophageal reflux disease)    Headache    History of kidney stones    Hyperglycemia    Hypertension    Nephrolithiasis    Pneumonia    Covid pneumonia   Renal cyst    Sleep apnea    Stricture and stenosis of esophagus    Stroke ([place]) 07/2015   Vision abnormalities     Patient Active Problem List   Diagnosis Date Noted   Hemiparesis affecting left side as late effect of cerebrovascular accident ([place]) 12/09/2021   Depressive reaction    Sleep disturbance    Urinary retention    Bradycardia    Essential  hypertension    Temperature elevated    Left thyroid nodule    Treatment-emergent central sleep apnea 10/25/2018   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) 10/25/2018   Chronic pain syndrome    Chronic bilateral low back pain    Benign essential HTN    Tobacco abuse    Hyperlipidemia    History of CVA with residual deficit    Dysphagia, post-stroke    ICH (intracerebral hemorrhage) ([place]) - R thalmic/PLIC d/t HTN 82/50/5397   Insomnia 07/13/2017   PAD (peripheral artery disease) ([place]) 07/13/2017   Hyperlipidemia with target LDL less than 70 07/13/2017   Stroke-like symptom 09/22/2016   Paresthesia 09/22/2016   CVA (cerebral vascular accident) ([place]) 09/22/2016   Cerebrovascular accident (CVA) due to thrombosis of left carotid artery ([place]) 09/23/2015   Primary snoring 09/23/2015   Hypersomnia with sleep apnea 09/23/2015   Obstructive sleep apnea 09/23/2015   Lacunar infarct, acute ([place]) 08/25/2015   Sleep apnea 08/25/2015   Dysarthria    CVD (cardiovascular disease) 08/02/2015   Acute hyperglycemia 67/34/1937   Systolic hypertension with cerebrovascular disease 12/27/2014   Epistaxis 07/28/2012   NEPHROLITHIASIS 05/23/2009   BARRETTS ESOPHAGUS 02/20/2009   Gastroparesis 02/20/2009   RADICULOPATHY 10/21/2008   GERD 10/08/2008   ESOPHAGITIS  08/15/2008   ESOPHAGEAL STRICTURE 08/15/2008   GASTRITIS, ACUTE 08/15/2008   RENAL CYST 08/14/2008   TOBACCO ABUSE 08/08/2008   HEMATURIA, MICROSCOPIC, HX OF 08/08/2008   PULMONARY NODULE 01/10/2008    Past Surgical History:  Procedure Laterality Date   CERVICAL [place] SURGERY  2012   COLONOSCOPY     LUMBAR [place] SURGERY  2005, 2010   replaced L4 and L5   SHOULDER ARTHROSCOPY WITH ROTATOR CUFF REPAIR Left 08/14/2020   Procedure: SHOULDER ARTHROSCOPY WITH ROTATOR CUFF REPAIR, SUBACROMIAL DECOMPRESSION;  Surgeon: Tania Ade, MD;  Location: [facility];  Service: Orthopedics;  Laterality: Left;   UPPER  GASTROINTESTINAL ENDOSCOPY         Home Medications    Prior to Admission medications   Medication Sig Start Date End Date Taking? Authorizing Provider  aspirin EC 81 MG tablet Take 1 tablet (81 mg total) by mouth daily. Swallow whole. 06/10/20   Frann Rider, NP  esomeprazole (NEXIUM) 40 MG capsule Take 1 capsule (40 mg total) by mouth 2 (two) times daily before a meal. 01/29/22   Armbruster, Carlota Raspberry, MD  lovastatin (MEVACOR) 20 MG tablet TAKE 1 TABLET BY MOUTH EVERYDAY AT BEDTIME 01/27/22   [place], [name], MD  Oxycodone HCl 10 MG TABS Take 10 mg by mouth 2 (two) times daily. 01/06/22   [provider]  traZODone (DESYREL) 50 MG tablet TAKE 1 TABLET AT BEDTIME ASNEEDED FOR SLEEP 09/21/21   [place], [name], MD    Family History Family History  Problem Relation Age of Onset   Hypertension Father    Stroke Father    Hypertension Mother    Liver cancer Mother    Hypertension Sister        2 other sisters has also   Stroke Sister    Stroke Sister    Hypertension Brother        2nd brother has also   Colon cancer Neg Hx    Esophageal cancer Neg Hx    Rectal cancer Neg Hx    Stomach cancer Neg Hx    Colon polyps Neg Hx     Social History Social History   Tobacco Use   Smoking status: Former    Packs/day: 0.50    Years: 30.00    Pack years: 15.00    Types: Cigarettes    Quit date: 04/15/2018    Years since quitting: 3.9   Smokeless tobacco: Never  Vaping Use   Vaping Use: Never used  Substance Use Topics   Alcohol use: No    Alcohol/week: 0.0 standard drinks   Drug use: Never    Comment: last use 04/2018     Allergies   Patient has no known allergies.   Review of Systems Review of Systems Per HPI  Physical Exam Triage Vital Signs ED Triage Vitals [04/06/22 1702]  Enc Vitals Group     BP 117/82     Pulse Rate 81     Resp 18     Temp 98.8 F (37.1 C)     Temp Source Oral     SpO2 96 %     Weight      Height      Head Circumference      Peak  Flow      Pain Score 4     Pain Loc      Pain Edu?      Excl. in [place]?    No data found.  Updated Vital Signs BP  117/82 (BP Location: Left Arm)   Pulse 81   Temp 98.8 F (37.1 C) (Oral)   Resp 18   SpO2 96%   Visual Acuity Right Eye Distance:   Left Eye Distance:   Bilateral Distance:    Right Eye Near:   Left Eye Near:    Bilateral Near:     Physical Exam Vitals and nursing note reviewed.  Constitutional:      General: He is not in acute distress.    Appearance: Normal appearance. He is well-developed. He is not ill-appearing.     Comments: Very pleasant patient sitting comfortably on exam able in no acute distress.   HENT:     Head: Normocephalic and atraumatic.     Right Ear: Tympanic membrane, ear canal and external ear normal.     Left Ear: Tympanic membrane, ear canal and external ear normal.     Nose: Nose normal. No rhinorrhea.     Right Turbinates: Swollen and pale.     Left Turbinates: Swollen and pale.     Mouth/Throat:     Lips: Pink.     Mouth: Mucous membranes are moist.     Pharynx: Oropharynx is clear. No posterior oropharyngeal erythema.     Tonsils: No tonsillar exudate or tonsillar abscesses.  Eyes:     General: Lids are normal. Vision grossly intact. Gaze aligned appropriately. No visual field deficit.    Extraocular Movements: Extraocular movements intact.     Conjunctiva/sclera: Conjunctivae normal.     Right eye: Right conjunctiva is not injected.     Left eye: Left conjunctiva is not injected.  Cardiovascular:     Rate and Rhythm: Normal rate and regular rhythm.     Heart sounds: Normal heart sounds, S1 normal and S2 normal. No murmur heard. Pulmonary:     Effort: Pulmonary effort is normal. No respiratory distress.     Breath sounds: Normal breath sounds. No decreased air movement. No decreased breath sounds, wheezing, rhonchi or rales.  Abdominal:     General: Abdomen is flat. A surgical scar is present. Bowel sounds are normal. There is  no distension.     Palpations: Abdomen is soft.     Tenderness: There is abdominal tenderness in the left lower quadrant. There is no right CVA tenderness or left CVA tenderness. Negative signs include Murphy's sign and McBurney's sign.     Hernia: No hernia is present.  Musculoskeletal:        General: No swelling.     Cervical back: Normal range of motion and neck supple.     Right lower leg: No edema.     Left lower leg: No edema.  Lymphadenopathy:     Cervical: No cervical adenopathy.  Skin:    General: Skin is warm and dry.     Capillary Refill: Capillary refill takes less than 2 seconds.     Findings: No rash.  Neurological:     Mental Status: He is alert and oriented to person, place, and time. Mental status is at baseline.     Cranial Nerves: Dysarthria present.     Gait: Gait is intact.     Comments: Dysarthria and facial asymmetry with left-sided facial droop present at baseline due to stroke in 2022.  Psychiatric:        Attention and Perception: Attention and perception normal.        Mood and Affect: Mood normal.        Speech: Speech normal.  Behavior: Behavior normal. Behavior is cooperative.        Thought Content: Thought content normal.        Cognition and Memory: Cognition and memory normal.        Judgment: Judgment normal.     UC Treatments / Results  Labs (all labs ordered are listed, but only abnormal results are displayed) Labs Reviewed  POCT URINALYSIS DIPSTICK, ED / UC - Abnormal; Notable for the following components:      Result Value   Hgb urine dipstick TRACE (*)    All other components within normal limits    EKG   Radiology No results found.  Procedures Procedures (including critical care time)  Medications Ordered in UC Medications  acetaminophen (TYLENOL) tablet 650 mg (650 mg Oral Given 04/06/22 1828)    Initial Impression / Assessment and Plan / UC Course  I have reviewed the triage vital signs and the nursing  notes.  Pertinent labs & imaging results that were available during my care of the patient were reviewed by me and considered in my medical decision making (see chart for details).  Patient presents urgent care today with significant left lower quadrant abdominal pain with palpation on physical exam.  Suspect abdominal pain is related to constipation due to recent change in his pain medication regimen.  He uses opioid pain medication for his chronic back pain.  Doubt acute bowel obstruction and deferred imaging at this time based on physical exam and stable vital signs.  No clinical indication for abdominal x-ray at this time.  Plan to treat patient's constipation with MiraLAX to be taken twice daily until patient has a bowel movement.  After having a bowel movement, he may take MiraLAX once daily for 1 week, then as needed after that.  He is to begin taking his Colace stool softener regularly once daily for constipation prevention while taking opioid medications for chronic back pain.  Advised patient to increase fluid intake while taking MiraLAX for better therapeutic effect of medication.  He is also to increase his fluid intake in general to prevent constipation in the future.  Advised patient to increase fiber intake as well with fruits, vegetables, and whole grains. If he is unable to have a bowel movement in the next 2-3 days with MiraLAX, he is to seek care.   Urinalysis today shows trace hematuria without signs of urinary tract infection. Specific gravity is stable at 1.020. Patient to follow-up with PCP regarding minimal hematuria.   Counseled patient regarding appropriate use of medications and potential side effects for all medications recommended or prescribed today. Discussed red flag signs and symptoms of worsening condition,when to call the PCP office, return to urgent care, and when to seek higher level of care. Patient verbalizes understanding and agreement with plan. All questions  answered. Patient discharged in stable condition.  Final Clinical Impressions(s) / UC Diagnoses   Final diagnoses:  Drug-induced constipation     Discharge Instructions      You were seen in urgent care today for constipation and abdominal pain.  I would like for you to take MiraLAX twice daily for the next few days until you have a bowel movement.  After you have a bowel movement, you may decrease your MiraLAX usage to once per day for the next week, then use as needed after that.  Please take your Colace stool softener daily to prevent constipation while taking opioid medications for chronic back pain.  If you have not had a  bowel movement in the next 2 to 3 days, you need to return to urgent care or go to the emergency department.  Increase your fluid intake and eat plenty of fruits, vegetables, and whole grains to increase your fiber intake.   If you develop any new or worsening symptoms or do not improve in the next 2 to 3 days, please return.  If your symptoms are severe, please go to the emergency room.  Follow-up with your primary care provider for further evaluation and management of your symptoms as well as ongoing wellness visits.  I hope you feel better!     ED Prescriptions     Medication Sig Dispense Auth. Provider   polyethylene glycol powder (GLYCOLAX/MIRALAX) 17 GM/SCOOP powder Take 255 g by mouth once for 1 dose. 255 g Talbot Grumbling, FNP      PDMP not reviewed this encounter.   Talbot Grumbling, [place] 04/09/22 [phone]

## 2022-04-06 NOTE — ED Notes (Signed)
Pt still unable to void, given another cup of water

## 2022-04-21 ENCOUNTER — Ambulatory Visit (INDEPENDENT_AMBULATORY_CARE_PROVIDER_SITE_OTHER): Payer: Medicare Other

## 2022-04-21 VITALS — BP 120/72 | HR 61 | Temp 97.9°F | Ht 74.0 in | Wt 227.1 lb

## 2022-04-21 DIAGNOSIS — Z0000 Encounter for general adult medical examination without abnormal findings: Secondary | ICD-10-CM

## 2022-04-21 NOTE — Patient Instructions (Addendum)
[name] , Thank you for taking time to come for your Medicare Wellness Visit. I appreciate your ongoing commitment to your health goals. Please review the following plan we discussed and let me know if I can assist you in the future.   These are the goals we discussed:  Goals       Patient stated (pt-stated)      I want to get back to normal.      Quit smoking / using tobacco        This is a list of the screening recommended for you and due dates:  Health Maintenance  Topic Date Due   Zoster (Shingles) Vaccine (1 of 2) 07/22/2022*   Flu Shot  06/15/2022   Pneumonia Vaccine (2 - PCV) 12/09/2022   Tetanus Vaccine  12/27/2024   Colon Cancer Screening  02/18/2027   Hepatitis C Screening: USPSTF Recommendation to screen - Ages 18-79 yo.  Completed   HPV Vaccine  Aged Out   COVID-19 Vaccine  Discontinued  *Topic was postponed. The date shown is not the original due date.    Opioid Pain Medicine Management Opioids are powerful medicines that are used to treat moderate to severe pain. When used for short periods of time, they can help you to: Sleep better. Do better in physical or occupational therapy. Feel better in the first few days after an injury. Recover from surgery. Opioids should be taken with the supervision of a trained health care provider. They should be taken for the shortest period of time possible. This is because opioids can be addictive, and the longer you take opioids, the greater your risk of addiction. This addiction can also be called opioid use disorder. What are the risks? Using opioid pain medicines for longer than 3 days increases your risk of side effects. Side effects include: Constipation. Nausea and vomiting. Breathing difficulties (respiratory depression). Drowsiness. Confusion. Opioid use disorder. Itching. Taking opioid pain medicine for a long period of time can affect your ability to do daily tasks. It also puts you at risk for: Motor vehicle  crashes. Depression. Suicide. Heart attack. Overdose, which can be life-threatening. What is a pain treatment plan? A pain treatment plan is an agreement between you and your health care provider. Pain is unique to each person, and treatments vary depending on your condition. To manage your pain, you and your health care provider need to work together. To help you do this: Discuss the goals of your treatment, including how much pain you might expect to have and how you will manage the pain. Review the risks and benefits of taking opioid medicines. Remember that a good treatment plan uses more than one approach and minimizes the chance of side effects. Be honest about the amount of medicines you take and about any drug or alcohol use. Get pain medicine prescriptions from only one health care provider. Pain can be managed with many types of alternative treatments. Ask your health care provider to refer you to one or more specialists who can help you manage pain through: Physical or occupational therapy. Counseling (cognitive behavioral therapy). Good nutrition. Biofeedback. Massage. Meditation. Non-opioid medicine. Following a gentle exercise program. How to use opioid pain medicine Taking medicine Take your pain medicine exactly as told by your health care provider. Take it only when you need it. If your pain gets less severe, you may take less than your prescribed dose if your health care provider approves. If you are not having pain, do nottake  pain medicine unless your health care provider tells you to take it. If your pain is severe, do nottry to treat it yourself by taking more pills than instructed on your prescription. Contact your health care provider for help. Write down the times when you take your pain medicine. It is easy to become confused while on pain medicine. Writing the time can help you avoid overdose. Take other over-the-counter or prescription medicines only as told by  your health care provider. Keeping yourself and others safe  While you are taking opioid pain medicine: Do not drive, use machinery, or power tools. Do not sign legal documents. Do not drink alcohol. Do not take sleeping pills. Do not supervise children by yourself. Do not do activities that require climbing or being in high places. Do not go to a lake, river, ocean, spa, or swimming pool. Do not share your pain medicine with anyone. Keep pain medicine in a locked cabinet or in a secure area where pets and children cannot reach it. Stopping your use of opioids If you have been taking opioid medicine for more than a few weeks, you may need to slowly decrease (taper) how much you take until you stop completely. Tapering your use of opioids can decrease your risk of symptoms of withdrawal, such as: Pain and cramping in the abdomen. Nausea. Sweating. Sleepiness. Restlessness. Uncontrollable shaking (tremors). Cravings for the medicine. Do not attempt to taper your use of opioids on your own. Talk with your health care provider about how to do this. Your health care provider may prescribe a step-down schedule based on how much medicine you are taking and how long you have been taking it. Getting rid of leftover pills Do not save any leftover pills. Get rid of leftover pills safely by: Taking the medicine to a prescription take-back program. This is usually offered by the county or law enforcement. Bringing them to a pharmacy that has a drug disposal container. Flushing them down the toilet. Check the label or package insert of your medicine to see whether this is safe to do. Throwing them out in the trash. Check the label or package insert of your medicine to see whether this is safe to do. If it is safe to throw it out, remove the medicine from the original container, put it into a sealable bag or container, and mix it with used coffee grounds, food scraps, dirt, or cat litter before putting  it in the trash. Follow these instructions at home: Activity Do exercises as told by your health care provider. Avoid activities that make your pain worse. Return to your normal activities as told by your health care provider. Ask your health care provider what activities are safe for you. General instructions You may need to take these actions to prevent or treat constipation: Drink enough fluid to keep your urine pale yellow. Take over-the-counter or prescription medicines. Eat foods that are high in fiber, such as beans, whole grains, and fresh fruits and vegetables. Limit foods that are high in fat and processed sugars, such as fried or sweet foods. Keep all follow-up visits. This is important. Where to find support If you have been taking opioids for a long time, you may benefit from receiving support for quitting from a local support group or counselor. Ask your health care provider for a referral to these resources in your area. Where to find more information Centers for Disease Control and Prevention (CDC): http://www.wolf.info/ [place] Food and Drug Administration (FDA): [place] Get help  right away if: You may have taken too much of an opioid (overdosed). Common symptoms of an overdose: Your breathing is slower or more shallow than normal. You have a very slow heartbeat (pulse). You have slurred speech. You have nausea and vomiting. Your pupils become very small. You have other potential symptoms: You are very confused. You faint or feel like you will faint. You have cold, clammy skin. You have blue lips or fingernails. You have thoughts of harming yourself or harming others. These symptoms may represent a serious problem that is an emergency. Do not wait to see if the symptoms will go away. Get medical help right away. Call your local emergency services (911 in the [place] Do not drive yourself to the hospital.  If you ever feel like you may hurt yourself or others, or have thoughts  about taking your own life, get help right away. Go to your nearest emergency department or: Call your local emergency services (911 in the [place] Call the [facility] 562-857-5745 in the [place] Call a suicide crisis helpline, such as the [facility] at 509 345 2723 or 988 in the [place]. This is open 24 hours a day in the [place] Text the Crisis Text Line at 440-042-6002 (in the [place] Summary Opioid medicines can help you manage moderate to severe pain for a short period of time. A pain treatment plan is an agreement between you and your health care provider. Discuss the goals of your treatment, including how much pain you might expect to have and how you will manage the pain. If you think that you or someone else may have taken too much of an opioid, get medical help right away. This information is not intended to replace advice given to you by your health care provider. Make sure you discuss any questions you have with your health care provider. Document Revised: 05/27/2021 Document Reviewed: 02/11/2021 Elsevier Patient Education  [place] directives: No   Conditions/risks identified: None  Next appointment: Follow up in one year for your annual wellness visit.   Preventive Care 98 Years and Older, Male Preventive care refers to lifestyle choices and visits with your health care provider that can promote health and wellness. What does preventive care include? A yearly physical exam. This is also called an annual well check. Dental exams once or twice a year. Routine eye exams. Ask your health care provider how often you should have your eyes checked. Personal lifestyle choices, including: Daily care of your teeth and gums. Regular physical activity. Eating a healthy diet. Avoiding tobacco and drug use. Limiting alcohol use. Practicing safe sex. Taking low doses of aspirin every day. Taking vitamin and mineral  supplements as recommended by your health care provider. What happens during an annual well check? The services and screenings done by your health care provider during your annual well check will depend on your age, overall health, lifestyle risk factors, and family history of disease. Counseling  Your health care provider may ask you questions about your: Alcohol use. Tobacco use. Drug use. Emotional well-being. Home and relationship well-being. Sexual activity. Eating habits. History of falls. Memory and ability to understand (cognition). Work and work Statistician. Screening  You may have the following tests or measurements: Height, weight, and BMI. Blood pressure. Lipid and cholesterol levels. These may be checked every 5 years, or more frequently if you are over 75 years old. Skin check. Lung cancer screening. You may have this screening  every year starting at age 74 if you have a 30-pack-year history of smoking and currently smoke or have quit within the past 15 years. Fecal occult blood test (FOBT) of the stool. You may have this test every year starting at age 54. Flexible sigmoidoscopy or colonoscopy. You may have a sigmoidoscopy every 5 years or a colonoscopy every 10 years starting at age 54. Prostate cancer screening. Recommendations will vary depending on your family history and other risks. Hepatitis C blood test. Hepatitis B blood test. Sexually transmitted disease (STD) testing. Diabetes screening. This is done by checking your blood sugar (glucose) after you have not eaten for a while (fasting). You may have this done every 1-3 years. Abdominal aortic aneurysm (AAA) screening. You may need this if you are a current or former smoker. Osteoporosis. You may be screened starting at age 6 if you are at high risk. Talk with your health care provider about your test results, treatment options, and if necessary, the need for more tests. Vaccines  Your health care provider  may recommend certain vaccines, such as: Influenza vaccine. This is recommended every year. Tetanus, diphtheria, and acellular pertussis (Tdap, Td) vaccine. You may need a Td booster every 10 years. Zoster vaccine. You may need this after age 21. Pneumococcal 13-valent conjugate (PCV13) vaccine. One dose is recommended after age 25. Pneumococcal polysaccharide (PPSV23) vaccine. One dose is recommended after age 36. Talk to your health care provider about which screenings and vaccines you need and how often you need them. This information is not intended to replace advice given to you by your health care provider. Make sure you discuss any questions you have with your health care provider. Document Released: 11/28/2015 Document Revised: 07/21/2016 Document Reviewed: 09/02/2015 Elsevier Interactive Patient Education  2017 [place] Prevention in the Home Falls can cause injuries. They can happen to people of all ages. There are many things you can do to make your home safe and to help prevent falls. What can I do on the outside of my home? Regularly fix the edges of walkways and driveways and fix any cracks. Remove anything that might make you trip as you walk through a door, such as a raised step or threshold. Trim any bushes or trees on the path to your home. Use bright outdoor lighting. Clear any walking paths of anything that might make someone trip, such as rocks or tools. Regularly check to see if handrails are loose or broken. Make sure that both sides of any steps have handrails. Any raised decks and porches should have guardrails on the edges. Have any leaves, snow, or ice cleared regularly. Use sand or salt on walking paths during winter. Clean up any spills in your garage right away. This includes oil or grease spills. What can I do in the bathroom? Use night lights. Install grab bars by the toilet and in the tub and shower. Do not use towel bars as grab bars. Use  non-skid mats or decals in the tub or shower. If you need to sit down in the shower, use a plastic, non-slip stool. Keep the floor dry. Clean up any water that spills on the floor as soon as it happens. Remove soap buildup in the tub or shower regularly. Attach bath mats securely with double-sided non-slip rug tape. Do not have throw rugs and other things on the floor that can make you trip. What can I do in the bedroom? Use night lights. Make sure that you have  a light by your bed that is easy to reach. Do not use any sheets or blankets that are too big for your bed. They should not hang down onto the floor. Have a firm chair that has side arms. You can use this for support while you get dressed. Do not have throw rugs and other things on the floor that can make you trip. What can I do in the kitchen? Clean up any spills right away. Avoid walking on wet floors. Keep items that you use a lot in easy-to-reach places. If you need to reach something above you, use a strong step stool that has a grab bar. Keep electrical cords out of the way. Do not use floor polish or wax that makes floors slippery. If you must use wax, use non-skid floor wax. Do not have throw rugs and other things on the floor that can make you trip. What can I do with my stairs? Do not leave any items on the stairs. Make sure that there are handrails on both sides of the stairs and use them. Fix handrails that are broken or loose. Make sure that handrails are as long as the stairways. Check any carpeting to make sure that it is firmly attached to the stairs. Fix any carpet that is loose or worn. Avoid having throw rugs at the top or bottom of the stairs. If you do have throw rugs, attach them to the floor with carpet tape. Make sure that you have a light switch at the top of the stairs and the bottom of the stairs. If you do not have them, ask someone to add them for you. What else can I do to help prevent falls? Wear  shoes that: Do not have high heels. Have rubber bottoms. Are comfortable and fit you well. Are closed at the toe. Do not wear sandals. If you use a stepladder: Make sure that it is fully opened. Do not climb a closed stepladder. Make sure that both sides of the stepladder are locked into place. Ask someone to hold it for you, if possible. Clearly mark and make sure that you can see: Any grab bars or handrails. First and last steps. Where the edge of each step is. Use tools that help you move around (mobility aids) if they are needed. These include: Canes. Walkers. Scooters. Crutches. Turn on the lights when you go into a dark area. Replace any light bulbs as soon as they burn out. Set up your furniture so you have a clear path. Avoid moving your furniture around. If any of your floors are uneven, fix them. If there are any pets around you, be aware of where they are. Review your medicines with your doctor. Some medicines can make you feel dizzy. This can increase your chance of falling. Ask your doctor what other things that you can do to help prevent falls. This information is not intended to replace advice given to you by your health care provider. Make sure you discuss any questions you have with your health care provider. Document Released: 08/28/2009 Document Revised: 04/08/2016 Document Reviewed: 12/06/2014 Elsevier Interactive Patient Education  2017 [employer].

## 2022-04-21 NOTE — Progress Notes (Signed)
Subjective:   Richard Vaughn is a 71 y.o. male who presents for Medicare Annual/Subsequent preventive examination.  Review of Systems        Objective:    Today's Vitals   04/21/22 1005 04/21/22 1016  BP: 120/72   Pulse: 61   Temp: 97.9 F (36.6 C)   TempSrc: Oral   SpO2: 97%   Weight: 227 lb 1.6 oz (103 kg)   Height: '6\' 2"'$  (1.88 m)   PainSc:  0-No pain   Body mass index is 29.16 kg/m.     04/21/2022   10:27 AM 11/03/2021    5:47 PM 04/24/2021    1:17 PM 08/07/2020   10:23 AM 06/23/2020    9:24 AM 05/12/2020    6:24 PM 05/05/2020    6:51 PM  Advanced Directives  Does Patient Have a Medical Advance Directive? No No No No No No No  Would patient like information on creating a medical advance directive? No - Patient declined No - Patient declined Yes (MAU/Ambulatory/Procedural Areas - Information given) No - Patient declined No - Patient declined No - Patient declined No - Patient declined    Current Medications (verified) Outpatient Encounter Medications as of 04/21/2022  Medication Sig   aspirin EC 81 MG tablet Take 1 tablet (81 mg total) by mouth daily. Swallow whole.   esomeprazole (NEXIUM) 40 MG capsule Take 1 capsule (40 mg total) by mouth 2 (two) times daily before a meal.   lovastatin (MEVACOR) 20 MG tablet TAKE 1 TABLET BY MOUTH EVERYDAY AT BEDTIME   Oxycodone HCl 10 MG TABS Take 10 mg by mouth 2 (two) times daily.   traZODone (DESYREL) 50 MG tablet TAKE 1 TABLET AT BEDTIME ASNEEDED FOR SLEEP   No facility-administered encounter medications on file as of 04/21/2022.    Allergies (verified) Patient has no known allergies.   History: Past Medical History:  Diagnosis Date   Barrett's esophagus    Chronic back pain    Depression    Gastritis    Mild   Gastroparesis    GERD (gastroesophageal reflux disease)    Headache    History of kidney stones    Hyperglycemia    Hypertension    Nephrolithiasis    Pneumonia    Covid pneumonia   Renal cyst    Sleep  apnea    Stricture and stenosis of esophagus    Stroke ([place]) 07/2015   Vision abnormalities    Past Surgical History:  Procedure Laterality Date   CERVICAL [place] SURGERY  2012   COLONOSCOPY     LUMBAR [place] SURGERY  2005, 2010   replaced L4 and L5   SHOULDER ARTHROSCOPY WITH ROTATOR CUFF REPAIR Left 08/14/2020   Procedure: SHOULDER ARTHROSCOPY WITH ROTATOR CUFF REPAIR, SUBACROMIAL DECOMPRESSION;  Surgeon: Tania Ade, MD;  Location: [facility];  Service: Orthopedics;  Laterality: Left;   UPPER GASTROINTESTINAL ENDOSCOPY     Family History  Problem Relation Age of Onset   Hypertension Father    Stroke Father    Hypertension Mother    Liver cancer Mother    Hypertension Sister        2 other sisters has also   Stroke Sister    Stroke Sister    Hypertension Brother        2nd brother has also   Colon cancer Neg Hx    Esophageal cancer Neg Hx    Rectal cancer Neg Hx    Stomach cancer Neg Hx  Colon polyps Neg Hx    Social History   Socioeconomic History   Marital status: Married    Spouse name: Kennard Fildes   Number of children: 3   Years of education: Not on file   Highest education level: Not on file  Occupational History   Occupation: retired    Fish farm manager: [employer]  Tobacco Use   Smoking status: Former    Packs/day: 0.50    Years: 30.00    Pack years: 15.00    Types: Cigarettes    Quit date: 04/15/2018    Years since quitting: 4.0   Smokeless tobacco: Never  Vaping Use   Vaping Use: Never used  Substance and Sexual Activity   Alcohol use: No    Alcohol/week: 0.0 standard drinks   Drug use: Never    Comment: last use 04/2018   Sexual activity: Yes  Other Topics Concern   Not on file  Social History Narrative   Not on file   Social Determinants of Health   Financial Resource Strain: Low Risk    Difficulty of Paying Living Expenses: Not hard at all  Food Insecurity: No Food Insecurity   Worried About Charity fundraiser in the Last Year: Never true    [place] in the Last Year: Never true  Transportation Needs: No Transportation Needs   Lack of Transportation (Medical): No   Lack of Transportation (Non-Medical): No  Physical Activity: Sufficiently Active   Days of Exercise per Week: 5 days   Minutes of Exercise per Session: 60 min  Stress: No Stress Concern Present   Feeling of Stress : Not at all  Social Connections: Moderately Isolated   Frequency of Communication with Friends and Family: More than three times a week   Frequency of Social Gatherings with Friends and Family: More than three times a week   Attends Religious Services: Never   Marine scientist or Organizations: No   Attends Archivist Meetings: Never   Marital Status: Married    Clinical Intake:   Diabetic?  No   Activities of Daily Living    04/21/2022   10:24 AM  In your present state of health, do you have any difficulty performing the following activities:  Hearing? 0  Vision? 0  Difficulty concentrating or making decisions? 0  Walking or climbing stairs? 0  Dressing or bathing? 0  Doing errands, shopping? 0  Preparing Food and eating ? N  Using the Toilet? N  In the past six months, have you accidently leaked urine? N  Do you have problems with loss of bowel control? N  Managing your Medications? N  Managing your Finances? N  Housekeeping or managing your Housekeeping? N    Patient Care Team: Martinique, [name], MD as PCP - General (Family Medicine)  Indicate any recent Medical Services you may have received from other than [facility] providers in the past year (date may be approximate).     Assessment:   This is a routine wellness examination for Richard Vaughn.  Hearing/Vision screen Hearing Screening - Comments:: No hearing difficulty Vision Screening - Comments:: Wears glasses. Follow[facility] by [facility]  Dietary issues and exercise activities discussed: Exercise limited by: None identified   Goals Addressed                This Visit's Progress     Patient stated (pt-stated)        I want to get back to normal.  Depression Screen    04/21/2022   10:22 AM 01/29/2022    2:35 PM 12/09/2021    3:20 PM 04/07/2021    4:40 PM 12/09/2020    9:31 AM 07/10/2018   10:06 AM 09/23/2015    2:44 PM  PHQ 2/9 Scores  PHQ - 2 Score 0 '2 3 4 '$ 0 0 2  PHQ- 9 Score  '3 10 11   14    '$ Fall Risk    04/21/2022   10:25 AM 12/09/2021    9:04 PM 12/11/2020    9:36 PM 08/16/2018    1:26 PM 05/15/2018   12:45 PM  Fall Risk   Falls in the past year? 1 0 1 No No  Number falls in past yr: 1 0 1    Injury with Fall? 0 0 0    Comment No injury or medicAl attention needed      Risk for fall due to : Other (Comment) Impaired mobility;Impaired balance/gait Impaired balance/gait    Risk for fall due to: Comment Loss balance      Follow up  Education provided Education provided      [place]:  Any stairs in or around the home? Yes  If so, are there any without handrails? No  Home free of loose throw rugs in walkways, pet beds, electrical cords, etc? Yes  Adequate lighting in your home to reduce risk of falls? Yes   ASSISTIVE DEVICES UTILIZED TO PREVENT FALLS:  Life alert? No  Use of a cane, walker or w/c? Yes  Grab bars in the bathroom? No  Shower chair or bench in shower? Yes  Elevated toilet seat or a handicapped toilet? Yes   TIMED UP AND GO:  Was the test performed? Yes .  Length of time to ambulate 10 feet: 5 sec.   Gait slow steady with use of device  Cognitive Function:      04/21/2022   10:27 AM  6CIT Screen  What Year? 0 points  What month? 0 points  What time? 0 points  Count back from 20 0 points  Months in reverse 0 points  Repeat phrase 0 points  Total Score 0 points    Immunizations Immunization History  Administered Date(s) Administered   PFIZER(Purple Top)SARS-COV-2 Vaccination 01/05/2020, 01/29/2020   Pfizer Covid-19 Vaccine Bivalent Booster 6yr & up  10/12/2021   Pneumococcal Polysaccharide-23 12/09/2020, 12/09/2021   Tdap 12/27/2014    TDAP status: Up to date  Flu Vaccine status: Declined, Education has been provided regarding the importance of this vaccine but patient still declined. Advised may receive this vaccine at local pharmacy or Health Dept. Aware to provide a copy of the vaccination record if obtained from local pharmacy or Health Dept. Verbalized acceptance and understanding.  Pneumococcal vaccine status: Up to date  Covid-19 vaccine status: Completed vaccines  Qualifies for Shingles Vaccine? Yes   Zostavax completed No   Shingrix Completed?: No.    Education has been provided regarding the importance of this vaccine. Patient has been advised to call insurance company to determine out of pocket expense if they have not yet received this vaccine. Advised may also receive vaccine at local pharmacy or Health Dept. Verbalized acceptance and understanding.  Screening Tests Health Maintenance  Topic Date Due   Zoster Vaccines- Shingrix (1 of 2) 07/22/2022 (Originally 10/30/1970)   INFLUENZA VACCINE  06/15/2022   Pneumonia Vaccine 69 Years old (2 - PCV) 12/09/2022   TETANUS/TDAP  12/27/2024  COLONOSCOPY (Pts 45-44yr Insurance coverage will need to be confirmed)  02/18/2027   Hepatitis C Screening  Completed   HPV VACCINES  Aged Out   COVID-19 Vaccine  Discontinued    Health Maintenance  There are no preventive care reminders to display for this patient.   Colorectal cancer screening: Type of screening: Colonoscopy. Completed 02/17/22. Repeat every 5 years  Lung Cancer Screening: (Low Dose CT Chest recommended if Age 69-80years, 30 pack-year currently smoking OR have quit w/in 15years.) does not qualify.    Additional Screening:  Hepatitis C Screening: does qualify; Completed 08/15/17  Vision Screening: Recommended annual ophthalmology exams for early detection of glaucoma and other disorders of the eye. Is the  patient up to date with their annual eye exam?  Yes  Who is the provider or what is the name of the office in which the patient attends annual eye exams[facility] not [facility] with a provider, would they like to be referred to a provider to establish care? No .   Dental Screening: Recommended annual dental exams for proper oral hygiene  Community Resource Referral / Chronic Care Management:  CRR required this visit?  No   CCM required this visit?  No      Plan:     I have personally reviewed and noted the following in the patient's chart:   Medical and social history Use of alcohol, tobacco or illicit drugs  Current medications and supplements including opioid prescriptions. Patient is currently taking opioid prescriptions. Information provided to patient regarding non-opioid alternatives. Patient advised to discuss non-opioid treatment plan with their provider. Functional ability and status Nutritional status Physical activity Advanced directives List of other physicians Hospitalizations, surgeries, and ER visits in previous 12 months Vitals Screenings to include cognitive, depression, and falls Referrals and appointments  In addition, I have reviewed and discussed with patient certain preventive protocols, quality metrics, and best practice recommendations. A written personalized care plan for preventive services as well as general preventive health recommendations were provided to patient.     BCriselda Peaches LPN   61/04/1095  Nurse Notes: None

## 2022-05-13 ENCOUNTER — Encounter: Payer: Self-pay | Admitting: Family Medicine

## 2022-05-17 NOTE — Telephone Encounter (Signed)
I prefer for him to continue following with pain management.  Thanks, BJ

## 2022-05-24 ENCOUNTER — Encounter: Payer: Self-pay | Admitting: Podiatry

## 2022-05-24 ENCOUNTER — Ambulatory Visit (INDEPENDENT_AMBULATORY_CARE_PROVIDER_SITE_OTHER): Payer: Medicare Other | Admitting: Podiatry

## 2022-05-24 DIAGNOSIS — B351 Tinea unguium: Secondary | ICD-10-CM

## 2022-05-24 DIAGNOSIS — M79674 Pain in right toe(s): Secondary | ICD-10-CM | POA: Diagnosis not present

## 2022-05-24 DIAGNOSIS — I639 Cerebral infarction, unspecified: Secondary | ICD-10-CM

## 2022-05-24 DIAGNOSIS — M79675 Pain in left toe(s): Secondary | ICD-10-CM

## 2022-05-24 DIAGNOSIS — I739 Peripheral vascular disease, unspecified: Secondary | ICD-10-CM

## 2022-05-24 NOTE — Progress Notes (Signed)
This patient returns to my office for at risk foot care.  This patient requires this care by a professional since this patient will be at risk due to having  CVA, PAD and hyperglycemia. He presents to the office with his wife.  This patient is unable to cut nails himself since the patient cannot reach his nails.These nails are painful walking and wearing shoes.  This patient presents for at risk foot care today.  General Appearance  Alert, conversant and in no acute stress.  Vascular  Dorsalis pedis and posterior tibial  pulses are palpable  bilaterally.  Capillary return is within normal limits  bilaterally. Temperature is within normal limits  bilaterally.  Neurologic  Senn-Weinstein monofilament wire test within normal limits  bilaterally. Muscle power within normal limits bilaterally.  Nails Thick disfigured discolored nails with subungual debris  from hallux to fifth toes bilaterally. No evidence of bacterial infection or drainage bilaterally.  Orthopedic  No limitations of motion  feet .  No crepitus or effusions noted.  Hammer toes  B/l.  Skin  normotropic skin with no porokeratosis noted bilaterally.  No signs of infections or ulcers noted.     Onychomycosis  Pain in right toes  Pain in left toes  Consent was obtained for treatment procedures.   Mechanical debridement of nails 1-5  bilaterally performed with a nail nipper.  Filed with dremel without incident.    Return office visit    10 weeks                 Told patient to return for periodic foot care and evaluation due to potential at risk complications.   Saraiyah Hemminger DPM   

## 2022-06-04 ENCOUNTER — Other Ambulatory Visit: Payer: Self-pay | Admitting: Family Medicine

## 2022-06-04 DIAGNOSIS — G4700 Insomnia, unspecified: Secondary | ICD-10-CM

## 2022-06-07 NOTE — Progress Notes (Signed)
HPI: Richard Vaughn is a 71 y.o. male, who is here today with his wife for follow up. Insomnia: Currently he is on trazodone 50 mg daily at bedtime as needed.. Sleeping about 8 hours. He feels rested the next day.  Hypertension: Currently he is on amlodipine 2.5 mg daily. BP at home 120s/70s. Negative for unusual/frequent headache, visual changes, CP, dyspnea, palpitations, new focal weakness (he has residual weakness from prior CVA), or erythema.  Lab Results  Component Value Date   CREATININE 0.99 01/29/2022   BUN 9 01/29/2022   NA 143 01/29/2022   K 4.4 01/29/2022   CL 106 01/29/2022   CO2 24 01/29/2022   Hyperlipidemia: He is no longer on lovastatin 20 mg daily because she is "tired of" taking medications. No side effects.  Lab Results  Component Value Date   CHOL 155 12/09/2021   HDL 43.00 12/09/2021   LDLCALC 94 12/09/2021   TRIG 88.0 12/09/2021   CHOLHDL 4 12/09/2021   CVA with residual weakness, left hemiplegia and hemiparesis, and dysarthria.  He is asking if there is something to help with drooling on left side. It is exacerbated by speaking and eating.  Chronic pain disorder, mainly lower back pain resulted from frequent heavy lifting at work,  [employer] Dr. Orpah Melter has left the practice and no not able to establish with new provider in the same practice. He is on oxycodone 10 mg twice daily, which he has not taken for a few weeks. Medication was helping with the pain. Pain is constant, achy 8/10, radiated to right lower extremity.  It is exacerbated by movement and alleviated by rest. He also has chronic left shoulder pain. Unstable gait, he completed PT and is still going to the gym to exercise 3 times per week.  Constipation: This problem has improved since he has not been taking oxycodone. Used to take MiraLAX or Colace.  Review of Systems  Constitutional:  Negative for activity change, appetite change and fever.  HENT:  Negative for mouth sores,  sore throat and trouble swallowing.   Respiratory:  Negative for cough and wheezing.   Gastrointestinal:  Negative for abdominal pain, nausea and vomiting.  Genitourinary:  Negative for decreased urine volume, dysuria and hematuria.  Musculoskeletal:  Positive for arthralgias, back pain and gait problem.  Neurological:  Positive for speech difficulty. Negative for seizures and syncope.  Rest see pertinent positives and negatives per HPI.  Current Outpatient Medications on File Prior to Visit  Medication Sig Dispense Refill   aspirin EC 81 MG tablet Take 1 tablet (81 mg total) by mouth daily. Swallow whole. 30 tablet 11   esomeprazole (NEXIUM) 40 MG capsule Take 1 capsule (40 mg total) by mouth 2 (two) times daily before a meal. 60 capsule 5   traZODone (DESYREL) 50 MG tablet TAKE 1 TABLET AT BEDTIME ASNEEDED FOR SLEEP 90 tablet 2   No current facility-administered medications on file prior to visit.   Past Medical History:  Diagnosis Date   Barrett's esophagus    Chronic back pain    Depression    Gastritis    Mild   Gastroparesis    GERD (gastroesophageal reflux disease)    Headache    History of kidney stones    Hyperglycemia    Hypertension    Nephrolithiasis    Pneumonia    Covid pneumonia   Renal cyst    Sleep apnea    Stricture and stenosis of esophagus    Stroke ([place])  07/2015   Vision abnormalities    No Known Allergies  Social History   Socioeconomic History   Marital status: Married    Spouse name: Boone Gear   Number of children: 3   Years of education: Not on file   Highest education level: 12th grade  Occupational History   Occupation: retired    Fish farm manager: [employer]  Tobacco Use   Smoking status: Former    Packs/day: 0.50    Years: 30.00    Total pack years: 15.00    Types: Cigarettes    Quit date: 04/15/2018    Years since quitting: 4.1   Smokeless tobacco: Never  Vaping Use   Vaping Use: Never used  Substance and Sexual Activity    Alcohol use: No    Alcohol/week: 0.0 standard drinks of alcohol   Drug use: Never    Comment: last use 04/2018   Sexual activity: Yes  Other Topics Concern   Not on file  Social History Narrative   Not on file   Social Determinants of Health   Financial Resource Strain: Low Risk  (04/21/2022)   Overall Financial Resource Strain (CARDIA)    Difficulty of Paying Living Expenses: Not hard at all  Food Insecurity: No Food Insecurity (06/04/2022)   Hunger Vital Sign    Worried About Running Out of Food in the Last Year: Never true    [place] in the Last Year: Never true  Transportation Needs: No Transportation Needs (06/04/2022)   PRAPARE - Hydrologist (Medical): No    Lack of Transportation (Non-Medical): No  Physical Activity: Sufficiently Active (06/04/2022)   Exercise Vital Sign    Days of Exercise per Week: 5 days    Minutes of Exercise per Session: 60 min  Stress: Stress Concern Present (06/04/2022)   [place]    Feeling of Stress : To some extent  Social Connections: Moderately Integrated (06/04/2022)   Social Connection and Isolation Panel [NHANES]    Frequency of Communication with Friends and Family: Once a week    Frequency of Social Gatherings with Friends and Family: More than three times a week    Attends Religious Services: 1 to 4 times per year    Active Member of [employer] or Organizations: No    Attends Archivist Meetings: Never    Marital Status: Married  Recent Concern: Social Connections - Moderately Isolated (04/21/2022)   Social Connection and Isolation Panel [NHANES]    Frequency of Communication with Friends and Family: More than three times a week    Frequency of Social Gatherings with Friends and Family: More than three times a week    Attends Religious Services: Never    Marine scientist or Organizations: No    Attends Archivist  Meetings: Never    Marital Status: Married   Vitals:   06/08/22 1436  BP: 120/74  Pulse: 62  Resp: 16   Body mass index is 29.32 kg/m.  Physical Exam Vitals and nursing note reviewed.  Constitutional:      General: He is not in acute distress.    Appearance: He is well-developed.  HENT:     Head: Normocephalic and atraumatic.     Mouth/Throat:     Mouth: Mucous membranes are moist.  Eyes:     Conjunctiva/sclera: Conjunctivae normal.  Cardiovascular:     Rate and Rhythm: Normal rate and regular  rhythm.     Heart sounds: No murmur heard.    Comments: PT pulses present Pulmonary:     Effort: Pulmonary effort is normal. No respiratory distress.     Breath sounds: Normal breath sounds.  Abdominal:     Palpations: Abdomen is soft. There is no mass.     Tenderness: There is no abdominal tenderness.  Skin:    General: Skin is warm.     Findings: No erythema or rash.  Neurological:     Mental Status: He is alert and oriented to person, place, and time.     Comments: Left-sided weakness and spurred speech residual from CVA. Unstable gait assisted by a cane.  Psychiatric:     Comments: Well groomed, good eye contact.   ASSESSMENT AND PLAN:  Mr.Tiandre was seen today for follow-up.  Diagnoses and all orders for this visit:  Chronic bilateral low back pain with right-sided sciatica Has been severe since he stopped Oxycodone.  Resume Oxycodone same dose. Side effects discussed. Fall precautions.  -     Oxycodone HCl 10 MG TABS; Take 1 tablet (10 mg total) by mouth 2 (two) times daily.  Hyperlipidemia with target LDL less than 70 He agrees with taking Lovastatin 20 mg every other day. We discussed CV benefits of statin.  Insomnia Problem is well controlled and stable. No changes in Trazodone dose. Good sleep hygiene to continue.  Essential hypertension BP adequately controlled. Continue Amlodipine 2.5 mg daily. Continue low-salt diet and monitoring BP  regularly.  Drug-induced constipation We discussed some side effects of oxycodone. Recommend Senokot or bisacodyl 5 mg daily as needed. Adequate fiber and fluid intake.  Chronic pain syndrome Oxycodone has helped with pain management. I agree with taking over pain management. Medication contract signed today. PMP reviewed. He will need a Worker's Comp. form completed annually. Follow-up in 5 months, before if needed.  I spent a total of 44 minutes in both face to face and non face to face activities for this visit on the date of this encounter. During this time history was obtained and documented, examination was performed, prior labs reviewed, and assessment/plan discussed.  Return in about 5 months (around 11/08/2022).  Nealy Hickmon G. Martinique, MD  [facility]. [place] office.

## 2022-06-08 ENCOUNTER — Ambulatory Visit (INDEPENDENT_AMBULATORY_CARE_PROVIDER_SITE_OTHER): Payer: Medicare Other | Admitting: Family Medicine

## 2022-06-08 ENCOUNTER — Encounter: Payer: Self-pay | Admitting: Family Medicine

## 2022-06-08 VITALS — BP 120/74 | HR 62 | Resp 16 | Ht 74.0 in | Wt 228.4 lb

## 2022-06-08 DIAGNOSIS — I10 Essential (primary) hypertension: Secondary | ICD-10-CM

## 2022-06-08 DIAGNOSIS — I639 Cerebral infarction, unspecified: Secondary | ICD-10-CM

## 2022-06-08 DIAGNOSIS — E785 Hyperlipidemia, unspecified: Secondary | ICD-10-CM | POA: Diagnosis not present

## 2022-06-08 DIAGNOSIS — G4700 Insomnia, unspecified: Secondary | ICD-10-CM

## 2022-06-08 DIAGNOSIS — K5903 Drug induced constipation: Secondary | ICD-10-CM | POA: Diagnosis not present

## 2022-06-08 DIAGNOSIS — G8929 Other chronic pain: Secondary | ICD-10-CM | POA: Diagnosis not present

## 2022-06-08 DIAGNOSIS — G894 Chronic pain syndrome: Secondary | ICD-10-CM

## 2022-06-08 DIAGNOSIS — M5441 Lumbago with sciatica, right side: Secondary | ICD-10-CM

## 2022-06-08 MED ORDER — LOVASTATIN 20 MG PO TABS: ORAL_TABLET | ORAL | 1 refills | Status: DC

## 2022-06-08 MED ORDER — AMLODIPINE BESYLATE 2.5 MG PO TABS: 2.5000 mg | ORAL_TABLET | Freq: Every day | ORAL | 2 refills | Status: DC

## 2022-06-08 MED ORDER — OXYCODONE HCL 10 MG PO TABS: 10.0000 mg | ORAL_TABLET | Freq: Two times a day (BID) | ORAL | 0 refills | Status: DC

## 2022-06-08 NOTE — Assessment & Plan Note (Signed)
We discussed some side effects of oxycodone. Recommend Senokot or bisacodyl 5 mg daily as needed. Adequate fiber and fluid intake.

## 2022-06-08 NOTE — Patient Instructions (Addendum)
A few things to remember from today's visit:   Chronic bilateral low back pain with right-sided sciatica  Hyperlipidemia with target LDL less than 70 - Plan: lovastatin (MEVACOR) 20 MG tablet  Insomnia, unspecified type  Essential hypertension  Chronic pain syndrome  Drug-induced constipation  If you need refills please call your pharmacy. Do not use My Chart to request refills or for acute issues that need immediate attention.   For constipation Senakot or Bisacodyl 5 mg. No changes today.  Please be sure medication list is accurate. If a new problem present, please set up appointment sooner than planned today.

## 2022-06-08 NOTE — Assessment & Plan Note (Signed)
He agrees with taking Lovastatin 20 mg every other day. We discussed CV benefits of statin.

## 2022-06-08 NOTE — Assessment & Plan Note (Signed)
Problem is well controlled and stable. No changes in Trazodone dose. Good sleep hygiene to continue.

## 2022-06-08 NOTE — Assessment & Plan Note (Signed)
Oxycodone has helped with pain management. I agree with taking over pain management. Medication contract signed today. PMP reviewed. He will need a Worker's Comp. form completed annually. Follow-up in 5 months, before if needed.

## 2022-06-08 NOTE — Assessment & Plan Note (Addendum)
BP adequately controlled. Continue Amlodipine 2.5 mg daily. Continue low-salt diet and monitoring BP regularly.

## 2022-07-07 ENCOUNTER — Encounter: Payer: Self-pay | Admitting: Neurology

## 2022-07-12 ENCOUNTER — Telehealth: Payer: Self-pay | Admitting: Gastroenterology

## 2022-07-12 NOTE — Telephone Encounter (Signed)
Pts wife states pt has been bloated for 3 days. Last BM was 2 days ago. Discussed with her that he can try miralax up to 3 doses/day to have BM. She verbalized understanding and will try this and call back if still having issues.

## 2022-07-12 NOTE — Progress Notes (Unsigned)
[facility] [address] [place]. [place] [address] [phone]       STROKE FOLLOW UP NOTE  Richard Vaughn Date of Birth:  [birth year] Medical Record Number:  [id number]   Reason for Referral: stroke follow up    SUBJECTIVE:   CHIEF COMPLAINT:  No chief complaint on file.   HPI:   Update 07/13/2022 JM: Patient returns for acute visit due to brain fog sensation over the past week.  He was previously seen by Dr. Leonie Man 9 months ago and relatively stable at that time. Reports transient episodes of difficulty remembering how to put his shoes on, how to go up steps, and how to get his leg to move.       History provided for reference purposes only Update 09/29/2021 Dr. Leonie Man: He returns for follow-up after last visit 4 months ago.  He is accompanied by his wife.  He is doing well.  He has had no recurrent stroke or TIA symptoms.  Continues to have mild left-sided weakness, stiffness, speech difficulties and numbness.  Is tolerating aspirin well without bruising or bleeding.  His blood pressure is quite well controlled and today it is 111/73.  He has had no health issues except he had COVID earlier last year and recovered very well from that.  He and his wife are considering going to [facility] to consider participation in the transcranial magnetic stimulation study in the rehab program for stroke.  Update 05/26/2020 JM: Richard Vaughn returns accompanied by his wife for stroke follow-up.  Sooner visit requested to be cleared for torn rotator cuff surgery. Residual deficits of left-sided weakness, gait impairment with imbalance, dysarthria and cognitive impairment.  Continues to work with outpatient neuro rehab PT/OT/ST with ongoing improvement but limited rehab of LUE due to torn rotator cuff.  He did receive cortisone injection with some benefit.  Blood pressure today 112/73 - monitors at home which has been stable.  Order placed for CT head after prior visit but this  has not been completed at this time.  He is currently being followed by [facility] orthopedics with Dr. Malena Catholic.  No further concerns at this time.  Initial visit 03/19/2020 JM: Richard Vaughn is being seen for hospital follow-up accompanied by his brother and wife.  Residual deficits left-sided weakness, gait impairment, dysarthria and mild to moderate cognitive communication deficits.  Continues to participate in outpatient therapies at neuro rehab with ongoing improvement.  Use of rollator walker outside of home denies any recent falls.  Blood pressure today 122/78.  History of mixed sleep apnea.  Has self discontinued CPAP approx 6 months ago due to difficulty tolerating masks.  Trialed 2 different types of masks previously but has increased anxiety with mask use.  Recent diagnosis of rotator cuff tear and questions undergoing surgical procedure.  No further concerns at this time.   Stroke admission 02/22/2020 Richard Vaughn is a 71 y.o. 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   Stroke work-up revealed acute right thalamic hemorrhage secondary to history of HTN.  History of HTN stable during admission with long-term BP goal normotensive range.  LDL 73 with statin contraindicated with ICH.  No history of DM.  Other stroke risk factors include advanced age, PAD, tobacco use, history of substance abuse, obesity, prior history of stroke and family history of stroke.  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  Evaluated by therapies and recommended discharge to [name] for ongoing therapy needs and eventually discharged home on 03/07/2020.  Stroke: acute right thalamic hemorrhage secondary to hx HTN  Resultant dysarthria and left face and extremity weakness Code Stroke CT Head - Positive for acute right thalamic hemorrhage (6 mL) with mild surrounding edema and minimal mass effect. No intraventricular or extra-axial extension at this time. Underlying advanced chronic small vessel disease.  CTA H&N - No large vessel occlusion. Minimal atherosclerosis in the head and neck - although there is at least moderate stenosis suspected at the origin of the non-dominant left vertebral artery origin. MRI head W & WO - 2.1 x 2.1 x 2.2 cm acute intraparenchymal hemorrhage centered at the right thalamus, unchanged from previous. Associated mild surrounding vasogenic edema and localized regional mass effect, with up to 3 mm of localized right-to-left shift. No underlying mass lesion, abnormal enhancement, or other abnormality identified. Multiple remote lacunar infarcts involving the left basal ganglia and thalamus.  2D Echo - EF 60 - 65%. No cardiac source of emboli identified.  [employer] Virus 2 - negative LDL - 73 HgbA1c - 5.4  VTE prophylaxis - SCDs aspirin 81 mg daily prior to admission, now on No antithrombotic Therapy recommendations:  [name] Disposition: [name]     ROS:   14 system review of systems performed and negative with exception of weakness, numbness update update gait impairment, speech impairment  PMH:  Past Medical History:  Diagnosis Date   Barrett's esophagus    Chronic back pain    Depression    Gastritis    Mild   Gastroparesis    GERD (gastroesophageal reflux disease)    Headache    History of kidney stones    Hyperglycemia    Hypertension    Nephrolithiasis    Pneumonia    Covid pneumonia   Renal cyst    Sleep apnea     Stricture and stenosis of esophagus    Stroke ([place]) 07/2015   Vision abnormalities     PSH:  Past Surgical History:  Procedure Laterality Date   CERVICAL [place] SURGERY  2012   COLONOSCOPY     LUMBAR [place] SURGERY  2005, 2010   replaced L4 and L5   SHOULDER ARTHROSCOPY WITH ROTATOR CUFF REPAIR Left 08/14/2020   Procedure: SHOULDER ARTHROSCOPY WITH ROTATOR CUFF REPAIR, SUBACROMIAL DECOMPRESSION;  Surgeon: Tania Ade, MD;  Location: [facility];  Service: Orthopedics;  Laterality: Left;   UPPER GASTROINTESTINAL ENDOSCOPY      Social History:  Social History   Socioeconomic History   Marital status: Married    Spouse name: Cormac Wint   Number of children: 3   Years of education: Not on file   Highest education level: 12th grade  Occupational History   Occupation: retired    Fish farm manager: [employer]  Tobacco Use   Smoking status: Former    Packs/day: 0.50    Years: 30.00    Total pack years: 15.00    Types: Cigarettes    Quit date: 04/15/2018    Years since quitting: 4.2   Smokeless tobacco: Never  Vaping Use  Vaping Use: Never used  Substance and Sexual Activity   Alcohol use: No    Alcohol/week: 0.0 standard drinks of alcohol   Drug use: Never    Comment: last use 04/2018   Sexual activity: Yes  Other Topics Concern   Not on file  Social History Narrative   Not on file   Social Determinants of Health   Financial Resource Strain: Low Risk  (04/21/2022)   Overall Financial Resource Strain (CARDIA)    Difficulty of Paying Living Expenses: Not hard at all  Food Insecurity: No Food Insecurity (06/04/2022)   Hunger Vital Sign    Worried About Running Out of Food in the Last Year: Never true    Ran Out of Food in the Last Year: Never true  Transportation Needs: No Transportation Needs (06/04/2022)   PRAPARE - Hydrologist (Medical): No    Lack of Transportation (Non-Medical): No  Physical Activity: Sufficiently Active (06/04/2022)    Exercise Vital Sign    Days of Exercise per Week: 5 days    Minutes of Exercise per Session: 60 min  Stress: Stress Concern Present (06/04/2022)   [place]    Feeling of Stress : To some extent  Social Connections: Moderately Integrated (06/04/2022)   Social Connection and Isolation Panel [NHANES]    Frequency of Communication with Friends and Family: Once a week    Frequency of Social Gatherings with Friends and Family: More than three times a week    Attends Religious Services: 1 to 4 times per year    Active Member of [employer] or Organizations: No    Attends Archivist Meetings: Never    Marital Status: Married  Recent Concern: Social Connections - Moderately Isolated (04/21/2022)   Social Connection and Isolation Panel [NHANES]    Frequency of Communication with Friends and Family: More than three times a week    Frequency of Social Gatherings with Friends and Family: More than three times a week    Attends Religious Services: Never    Marine scientist or Organizations: No    Attends Archivist Meetings: Never    Marital Status: Married  Human resources officer Violence: Not At Risk (04/21/2022)   Humiliation, Afraid, Rape, and Kick questionnaire    Fear of Current or Ex-Partner: No    Emotionally Abused: No    Physically Abused: No    Sexually Abused: No    Family History:  Family History  Problem Relation Age of Onset   Hypertension Father    Stroke Father    Hypertension Mother    Liver cancer Mother    Hypertension Sister        2 other sisters has also   Stroke Sister    Stroke Sister    Hypertension Brother        2nd brother has also   Colon cancer Neg Hx    Esophageal cancer Neg Hx    Rectal cancer Neg Hx    Stomach cancer Neg Hx    Colon polyps Neg Hx     Medications:   Current Outpatient Medications on File Prior to Visit  Medication Sig Dispense Refill   amLODipine  (NORVASC) 2.5 MG tablet Take 1 tablet (2.5 mg total) by mouth at bedtime. 90 tablet 2   aspirin EC 81 MG tablet Take 1 tablet (81 mg total) by mouth daily. Swallow whole. 30 tablet 11   esomeprazole (  NEXIUM) 40 MG capsule Take 1 capsule (40 mg total) by mouth 2 (two) times daily before a meal. 60 capsule 5   lovastatin (MEVACOR) 20 MG tablet 1 tab every other day. 45 tablet 1   Oxycodone HCl 10 MG TABS Take 1 tablet (10 mg total) by mouth 2 (two) times daily. 60 tablet 0   traZODone (DESYREL) 50 MG tablet TAKE 1 TABLET AT BEDTIME ASNEEDED FOR SLEEP 90 tablet 2   No current facility-administered medications on file prior to visit.    Allergies:  No Known Allergies    OBJECTIVE:  Physical Exam  There were no vitals filed for this visit.  There is no height or weight on file to calculate BMI. No results found.  General: well developed, well nourished, pleasant middle-aged African-American male, seated, in no evident distress Head: head normocephalic and atraumatic.   Neck: supple with no carotid or supraclavicular bruits Cardiovascular: regular rate and rhythm, no murmurs Musculoskeletal: no deformity Skin:  no rash/petichiae Vascular:  Normal pulses all extremities   Neurologic Exam Mental Status: Awake and fully alert. moderate dysarthria. Oriented to place and time. Recent and remote memory intact. Attention span, concentration and fund of knowledge appropriate during visit. Mood and affect appropriate.  Cranial Nerves: Pupils equal, briskly reactive to light. Extraocular movements full without nystagmus. Visual fields full to confrontation. Hearing intact. Facial sensation intact. Face, tongue, palate moves normally and symmetrically.  Motor: Normal bulk and tone. Normal strength in all tested extremity muscles except mild decreased left hand dexterity.  Mild shoulder elevation limited due to pain.  Tone is increased on the left compared to the right.  Sensory.: intact to touch ,  pinprick , position and vibratory sensation.  Coordination: Rapid alternating movements normal in all extremities except slightly decreased left hand. Finger-to-nose performed accurately RUE and heel-to-shin show mild LLE ataxia. Gait and Station: Arises from chair without difficulty. Stance is normal. Gait demonstrates  hemiplegic gait with use of walker.  With slight dragging of the left leg.  Tandem walk not attempted Reflexes: 1+ and symmetric. Toes downgoing.         ASSESSMENT/PLAN: Richard Vaughn is a 71 y.o. year old male with ICH  in right thalamus/posterior limb likely secondary to HTN in 02/2018 with residual mild left-sided weakness, mild dysarthria, gait impairment and mild cognitive impairment  Vascular risk factors include multiple prior strokes including lacunar infarcts and small hypertensive bleeds, HTN, HLD, PAD, sleep apnea, history of substance abuse and family history of stroke.       1.  Brain fog 2. Hx of ICH    I had a long discussion the patient with regarding his remote subcortical intracerebral hemorrhage and residual spastic left hemiparesis and answered questions.  Continue strict control of hypertension with blood pressure goal below 130/90.  Continue aspirin for stroke prevention and aggressive risk factor modification.  Continue to use a cane for ambulation and fall and safety precautions.  Patient and wife inquired about transcranial magnetic stimulation program at [facility] to help with his left-sided weakness.  I informed them that I was unaware that this was FDA approved specifically for stroke but encouraged him to seek more information from [facility] to see if he qualified for clinical trial that they were involved in.  Return follow-up in the future in a year or call earlier if necessary.    CC:  [place], [name], MD   I spent *** minutes of face-to-face and non-face-to-face time with  patient.  This included previsit chart review, lab review,  study review, order entry, electronic health record documentation, patient education  Frann Rider, [facility]  [facility] [address] [place], [place] [address]  Phone [phone] Fax [phone] Note: This document was prepared with digital dictation and possible smart phrase technology. Any transcriptional errors that result from this process are unintentional.

## 2022-07-12 NOTE — Telephone Encounter (Signed)
Inbound call from patients wife stating that patient has been experiencing bloating, full feeling, and nausea. Patients wife is requesting a call back to discuss. Please advise.

## 2022-07-13 ENCOUNTER — Encounter: Payer: Self-pay | Admitting: Adult Health

## 2022-07-13 ENCOUNTER — Ambulatory Visit (INDEPENDENT_AMBULATORY_CARE_PROVIDER_SITE_OTHER): Payer: Medicare Other | Admitting: Adult Health

## 2022-07-13 VITALS — BP 133/85 | HR 60 | Ht 74.0 in | Wt 230.2 lb

## 2022-07-13 DIAGNOSIS — I610 Nontraumatic intracerebral hemorrhage in hemisphere, subcortical: Secondary | ICD-10-CM

## 2022-07-13 DIAGNOSIS — E559 Vitamin D deficiency, unspecified: Secondary | ICD-10-CM | POA: Diagnosis not present

## 2022-07-13 DIAGNOSIS — R404 Transient alteration of awareness: Secondary | ICD-10-CM

## 2022-07-13 NOTE — Patient Instructions (Addendum)
Your Plan:  You will be scheduled to complete an EEG to rule out any seizure activity  We will check blood work today   Please let me know if these episodes should reoccur, if above work up negative for acute findings and these episodes should reoccur, we may have to proceed with completing an MRI brain         Thank you for coming to see [place] at [facility]. I hope we have been able to provide you high quality care today.  You may receive a patient satisfaction survey over the next few weeks. We would appreciate your feedback and comments so that we may continue to improve ourselves and the health of our patients.

## 2022-07-14 ENCOUNTER — Other Ambulatory Visit: Payer: Self-pay | Admitting: Family Medicine

## 2022-07-14 DIAGNOSIS — G8929 Other chronic pain: Secondary | ICD-10-CM

## 2022-07-14 DIAGNOSIS — G894 Chronic pain syndrome: Secondary | ICD-10-CM

## 2022-07-14 LAB — CBC WITH DIFFERENTIAL/PLATELET
Basophils Absolute: 0 10*3/uL (ref 0.0–0.2)
Basos: 0 %
EOS (ABSOLUTE): 0.1 10*3/uL (ref 0.0–0.4)
Eos: 2 %
Hematocrit: 40.4 % (ref 37.5–51.0)
Hemoglobin: 13.5 g/dL (ref 13.0–17.7)
Immature Grans (Abs): 0 10*3/uL (ref 0.0–0.1)
Immature Granulocytes: 0 %
Lymphocytes Absolute: 1.6 10*3/uL (ref 0.7–3.1)
Lymphs: 33 %
MCH: 30.8 pg (ref 26.6–33.0)
MCHC: 33.4 g/dL (ref 31.5–35.7)
MCV: 92 fL (ref 79–97)
Monocytes Absolute: 0.4 10*3/uL (ref 0.1–0.9)
Monocytes: 9 %
Neutrophils Absolute: 2.8 10*3/uL (ref 1.4–7.0)
Neutrophils: 56 %
Platelets: 190 10*3/uL (ref 150–450)
RBC: 4.39 x10E6/uL (ref 4.14–5.80)
RDW: 13 % (ref 11.6–15.4)
WBC: 4.9 10*3/uL (ref 3.4–10.8)

## 2022-07-14 LAB — COMPREHENSIVE METABOLIC PANEL
ALT: 22 IU/L (ref 0–44)
AST: 18 IU/L (ref 0–40)
Albumin/Globulin Ratio: 1.7 (ref 1.2–2.2)
Albumin: 4.4 g/dL (ref 3.9–4.9)
Alkaline Phosphatase: 85 IU/L (ref 44–121)
BUN/Creatinine Ratio: 10 (ref 10–24)
BUN: 10 mg/dL (ref 8–27)
Bilirubin Total: 0.4 mg/dL (ref 0.0–1.2)
CO2: 24 mmol/L (ref 20–29)
Calcium: 9.4 mg/dL (ref 8.6–10.2)
Chloride: 104 mmol/L (ref 96–106)
Creatinine, Ser: 0.98 mg/dL (ref 0.76–1.27)
Globulin, Total: 2.6 g/dL (ref 1.5–4.5)
Glucose: 107 mg/dL — ABNORMAL HIGH (ref 70–99)
Potassium: 3.9 mmol/L (ref 3.5–5.2)
Sodium: 141 mmol/L (ref 134–144)
Total Protein: 7 g/dL (ref 6.0–8.5)
eGFR: 84 mL/min/{1.73_m2} (ref >59)

## 2022-07-14 LAB — VITAMIN D 25 HYDROXY (VIT D DEFICIENCY, FRACTURES): Vit D, 25-Hydroxy: 33.5 ng/mL (ref 30.0–100.0)

## 2022-07-14 LAB — TSH: TSH: 1.48 u[IU]/mL (ref 0.450–4.500)

## 2022-07-14 LAB — VITAMIN B12: Vitamin B-12: 699 pg/mL (ref 232–1245)

## 2022-07-16 ENCOUNTER — Encounter: Payer: Self-pay | Admitting: Family Medicine

## 2022-07-16 DIAGNOSIS — G8929 Other chronic pain: Secondary | ICD-10-CM

## 2022-07-16 DIAGNOSIS — G894 Chronic pain syndrome: Secondary | ICD-10-CM

## 2022-07-18 MED ORDER — OXYCODONE HCL 10 MG PO TABS: 10.0000 mg | ORAL_TABLET | Freq: Two times a day (BID) | ORAL | 0 refills | Status: DC

## 2022-07-20 ENCOUNTER — Ambulatory Visit (INDEPENDENT_AMBULATORY_CARE_PROVIDER_SITE_OTHER): Payer: Medicare Other | Admitting: Neurology

## 2022-07-20 DIAGNOSIS — R4182 Altered mental status, unspecified: Secondary | ICD-10-CM | POA: Diagnosis not present

## 2022-07-20 DIAGNOSIS — R404 Transient alteration of awareness: Secondary | ICD-10-CM

## 2022-08-03 ENCOUNTER — Ambulatory Visit: Payer: Federal, State, Local not specified - PPO | Admitting: Podiatry

## 2022-08-13 ENCOUNTER — Other Ambulatory Visit: Payer: Self-pay | Admitting: Gastroenterology

## 2022-08-13 DIAGNOSIS — R6881 Early satiety: Secondary | ICD-10-CM

## 2022-08-23 ENCOUNTER — Ambulatory Visit (INDEPENDENT_AMBULATORY_CARE_PROVIDER_SITE_OTHER): Payer: Medicare Other | Admitting: Podiatry

## 2022-08-23 ENCOUNTER — Encounter: Payer: Self-pay | Admitting: Podiatry

## 2022-08-23 ENCOUNTER — Other Ambulatory Visit: Payer: Self-pay | Admitting: Family Medicine

## 2022-08-23 DIAGNOSIS — M79674 Pain in right toe(s): Secondary | ICD-10-CM | POA: Diagnosis not present

## 2022-08-23 DIAGNOSIS — G894 Chronic pain syndrome: Secondary | ICD-10-CM

## 2022-08-23 DIAGNOSIS — M79675 Pain in left toe(s): Secondary | ICD-10-CM | POA: Diagnosis not present

## 2022-08-23 DIAGNOSIS — I739 Peripheral vascular disease, unspecified: Secondary | ICD-10-CM

## 2022-08-23 DIAGNOSIS — B351 Tinea unguium: Secondary | ICD-10-CM | POA: Diagnosis not present

## 2022-08-23 DIAGNOSIS — I639 Cerebral infarction, unspecified: Secondary | ICD-10-CM

## 2022-08-23 DIAGNOSIS — G8929 Other chronic pain: Secondary | ICD-10-CM

## 2022-08-23 MED ORDER — OXYCODONE HCL 10 MG PO TABS: 10.0000 mg | ORAL_TABLET | Freq: Two times a day (BID) | ORAL | 0 refills | Status: DC

## 2022-08-23 NOTE — Progress Notes (Signed)
This patient returns to my office for at risk foot care.  This patient requires this care by a professional since this patient will be at risk due to having  CVA, PAD and hyperglycemia. He presents to the office with his wife.  This patient is unable to cut nails himself since the patient cannot reach his nails.These nails are painful walking and wearing shoes.  This patient presents for at risk foot care today.  General Appearance  Alert, conversant and in no acute stress.  Vascular  Dorsalis pedis and posterior tibial  pulses are palpable  bilaterally.  Capillary return is within normal limits  bilaterally. Temperature is within normal limits  bilaterally.  Neurologic  Senn-Weinstein monofilament wire test within normal limits  bilaterally. Muscle power within normal limits bilaterally.  Nails Thick disfigured discolored nails with subungual debris  from hallux to fifth toes bilaterally. No evidence of bacterial infection or drainage bilaterally.  Orthopedic  No limitations of motion  feet .  No crepitus or effusions noted.  Hammer toes  B/l.  Skin  normotropic skin with no porokeratosis noted bilaterally.  No signs of infections or ulcers noted.     Onychomycosis  Pain in right toes  Pain in left toes  Consent was obtained for treatment procedures.   Mechanical debridement of nails 1-5  bilaterally performed with a nail nipper.  Filed with dremel without incident.    Return office visit    10 weeks                 Told patient to return for periodic foot care and evaluation due to potential at risk complications.   Lillis Nuttle DPM   

## 2022-09-14 ENCOUNTER — Ambulatory Visit: Payer: Medicare Other

## 2022-09-29 ENCOUNTER — Ambulatory Visit (INDEPENDENT_AMBULATORY_CARE_PROVIDER_SITE_OTHER): Payer: Medicare Other | Admitting: Neurology

## 2022-09-29 ENCOUNTER — Telehealth: Payer: Self-pay | Admitting: Neurology

## 2022-09-29 ENCOUNTER — Encounter: Payer: Self-pay | Admitting: Neurology

## 2022-09-29 VITALS — BP 118/79 | HR 60 | Ht 74.75 in | Wt 231.0 lb

## 2022-09-29 DIAGNOSIS — I639 Cerebral infarction, unspecified: Secondary | ICD-10-CM | POA: Diagnosis not present

## 2022-09-29 DIAGNOSIS — G8114 Spastic hemiplegia affecting left nondominant side: Secondary | ICD-10-CM

## 2022-09-29 DIAGNOSIS — G3184 Mild cognitive impairment, so stated: Secondary | ICD-10-CM | POA: Diagnosis not present

## 2022-09-29 DIAGNOSIS — R296 Repeated falls: Secondary | ICD-10-CM | POA: Diagnosis not present

## 2022-09-29 NOTE — Progress Notes (Signed)
[facility] [address] [place]. [place] [address] [phone]       STROKE FOLLOW UP NOTE  Vaughn. Richard Vaughn Date of Birth:  October 15, 1951 Medical Record Number:  [id number]   Primary neurologist: Richard Vaughn Reason for visit: Transient altered mental status    SUBJECTIVE:   CHIEF COMPLAINT:  Chief Complaint  Patient presents with   Follow-up    Rm 17. F/u.    HPI:  Update 09/29/2022 : She returns for follow-up after last visit with Richard Vaughn practitioner 2-1/2 months ago.  He is accompanied by his wife.  Patient states his memory difficulties was unchanged since the last visit.  He has a new complaint of worsening of his balance.  He has fallen twice in the last month and fell today as well.  Patient does not use his cane as he is supposed to.  He has residual left-sided weakness and in his left leg from his stroke.  Denies any tingling numbness or burning in his feet.  Denies any headache or vertigo.  Denies any new strokelike symptoms.  He denies any neck pain or radicular pain or back pain.  Currently is tolerating well without bruising or bleeding.  His blood pressures under good control and today it is 118/79.  He is tolerating Mevacor well without muscle aches and pains. Update 07/13/2022 Richard Vaughn: Patient returns for acute visit accompanied by his wife. He was previously seen by Richard Vaughn 9 months ago and relatively stable at that time.  Reports last week over a 3-day period, he works transient episodes of difficulty remembering how to put his shoes on, how to go up steps, and how to get his leg to move.  These lasted less than 1 minute and then remembered how to do these tasks and completely at baseline.  Denies any associated headache, new or worsening stroke/TIA symptoms or seizure activity.  No prior history of migraine headaches or seizures.  Denies any recent medication changes, illness or infection.  Denies any substance use.  Baseline mild cognitive  impairment poststroke but believes this has been stable.  MMSE today 22/30.  Compliant on aspirin and lovastatin.  Blood pressure well controlled.  Routinely followed by PCP Dr. Martinique.  No further concerns at this time      History provided for reference purposes only Update 09/29/2021 Richard Vaughn: He returns for follow-up after last visit 4 months ago.  He is accompanied by his wife.  He is doing well.  He has had no recurrent stroke or TIA symptoms.  Continues to have mild left-sided weakness, stiffness, speech difficulties and numbness.  Is tolerating aspirin well without bruising or bleeding.  His blood pressure is quite well controlled and today it is 111/73.  He has had no health issues except he had COVID earlier last year and recovered very well from that.  He and his wife are considering going to [facility] to consider participation in the transcranial magnetic stimulation study in the rehab program for stroke.  Update 05/26/2020 Richard Vaughn: Richard Vaughn returns accompanied by his wife for stroke follow-up.  Sooner visit requested to be cleared for torn rotator cuff surgery. Residual deficits of left-sided weakness, gait impairment with imbalance, dysarthria and cognitive impairment.  Continues to work with outpatient neuro rehab PT/OT/ST with ongoing improvement but limited rehab of LUE due to torn rotator cuff.  He did receive cortisone injection with some benefit.  Blood pressure today 112/73 - monitors at home which has been stable.  Order placed for CT head after prior visit but this has not been completed at this time.  He is currently being followed by [facility] orthopedics with Dr. Malena Catholic.  No further concerns at this time.  Initial visit 03/19/2020 Richard Vaughn: Richard Vaughn is being seen for hospital follow-up accompanied by his brother and wife.  Residual deficits left-sided weakness, gait impairment, dysarthria and mild to moderate cognitive communication deficits.  Continues to participate in outpatient  therapies at neuro rehab with ongoing improvement.  Use of rollator walker outside of home denies any recent falls.  Blood pressure today 122/78.  History of mixed sleep apnea.  Has self discontinued CPAP approx 6 months ago due to difficulty tolerating masks.  Trialed 2 different types of masks previously but has increased anxiety with mask use.  Recent diagnosis of rotator cuff tear and questions undergoing surgical procedure.  No further concerns at this time.   Stroke admission 02/22/2020 Vaughn. Richard Vaughn is a 71 y.o. 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   Stroke work-up revealed acute right thalamic hemorrhage secondary to history of HTN.  History of HTN stable during admission with long-term BP goal normotensive range.  LDL 73 with statin contraindicated with ICH.  No history of DM.  Other stroke risk factors include advanced age, PAD, tobacco use, history of substance abuse, obesity, prior history of stroke and family history of stroke.  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  Evaluated by therapies and recommended discharge to [name] for ongoing therapy needs and eventually discharged home on 03/07/2020.  Stroke: acute right thalamic hemorrhage secondary to hx HTN  Resultant dysarthria and left face and extremity weakness Code Stroke CT Head - Positive for acute right thalamic hemorrhage (6 mL) with mild surrounding edema and  minimal mass effect. No intraventricular or extra-axial extension at this time. Underlying advanced chronic small vessel disease.  CTA H&N - No large vessel occlusion. Minimal atherosclerosis in the head and neck - although there is at least moderate stenosis suspected at the origin of the non-dominant left vertebral artery origin. MRI head W & WO - 2.1 x 2.1 x 2.2 cm acute intraparenchymal hemorrhage centered at the right thalamus, unchanged from previous. Associated mild surrounding vasogenic edema and localized regional mass effect, with up to 3 mm of localized right-to-left shift. No underlying mass lesion, abnormal enhancement, or other abnormality identified. Multiple remote lacunar infarcts involving the left basal ganglia and thalamus.  2D Echo - EF 60 - 65%. No cardiac source of emboli identified.  [employer] Virus 2 - negative LDL - 73 HgbA1c - 5.4  VTE prophylaxis - SCDs aspirin 81 mg daily prior to admission, now on No antithrombotic Therapy recommendations:  [name] Disposition: [name]     ROS:   14 system review of systems performed and negative with exception of those listed in HPI  PMH:  Past Medical History:  Diagnosis Date   Barrett's esophagus    Chronic back pain    Depression    Gastritis    Mild  Gastroparesis    GERD (gastroesophageal reflux disease)    Headache    History of kidney stones    Hyperglycemia    Hypertension    Nephrolithiasis    Pneumonia    Covid pneumonia   Renal cyst    Sleep apnea    Stricture and stenosis of esophagus    Stroke ([place]) 07/2015   Vision abnormalities     PSH:  Past Surgical History:  Procedure Laterality Date   CERVICAL [place] SURGERY  2012   COLONOSCOPY     LUMBAR [place] SURGERY  2005, 2010   replaced L4 and L5   SHOULDER ARTHROSCOPY WITH ROTATOR CUFF REPAIR Left 08/14/2020   Procedure: SHOULDER ARTHROSCOPY WITH ROTATOR CUFF REPAIR, SUBACROMIAL DECOMPRESSION;  Surgeon: Tania Ade, MD;  Location: [facility];  Service:  Orthopedics;  Laterality: Left;   UPPER GASTROINTESTINAL ENDOSCOPY      Social History:  Social History   Socioeconomic History   Marital status: Married    Spouse name: Crawford Tamura   Number of children: 3   Years of education: Not on file   Highest education level: 12th grade  Occupational History   Occupation: retired    Fish farm manager: [employer]  Tobacco Use   Smoking status: Former    Packs/day: 0.50    Years: 30.00    Total pack years: 15.00    Types: Cigarettes    Quit date: 04/15/2018    Years since quitting: 4.4   Smokeless tobacco: Never  Vaping Use   Vaping Use: Never used  Substance and Sexual Activity   Alcohol use: No    Alcohol/week: 0.0 standard drinks of alcohol   Drug use: Never    Comment: last use 04/2018   Sexual activity: Yes  Other Topics Concern   Not on file  Social History Narrative   Not on file   Social Determinants of Health   Financial Resource Strain: Low Risk  (04/21/2022)   Overall Financial Resource Strain (CARDIA)    Difficulty of Paying Living Expenses: Not hard at all  Food Insecurity: No Food Insecurity (06/04/2022)   Hunger Vital Sign    Worried About Running Out of Food in the Last Year: Never true    [place] in the Last Year: Never true  Transportation Needs: No Transportation Needs (06/04/2022)   PRAPARE - Hydrologist (Medical): No    Lack of Transportation (Non-Medical): No  Physical Activity: Sufficiently Active (06/04/2022)   Exercise Vital Sign    Days of Exercise per Week: 5 days    Minutes of Exercise per Session: 60 min  Stress: Stress Concern Present (06/04/2022)   [place]    Feeling of Stress : To some extent  Social Connections: Moderately Integrated (06/04/2022)   Social Connection and Isolation Panel [NHANES]    Frequency of Communication with Friends and Family: Once a week    Frequency of Social Gatherings  with Friends and Family: More than three times a week    Attends Religious Services: 1 to 4 times per year    Active Member of [employer] or Organizations: No    Attends Archivist Meetings: Never    Marital Status: Married  Recent Concern: Social Connections - Moderately Isolated (04/21/2022)   Social Connection and Isolation Panel [NHANES]    Frequency of Communication with Friends and Family: More than three times a week    Frequency of  Social Gatherings with Friends and Family: More than three times a week    Attends Religious Services: Never    Marine scientist or Organizations: No    Attends Archivist Meetings: Never    Marital Status: Married  Human resources officer Violence: Not At Risk (04/21/2022)   Humiliation, Afraid, Rape, and Kick questionnaire    Fear of Current or Ex-Partner: No    Emotionally Abused: No    Physically Abused: No    Sexually Abused: No    Family History:  Family History  Problem Relation Age of Onset   Hypertension Father    Stroke Father    Hypertension Mother    Liver cancer Mother    Hypertension Sister        2 other sisters has also   Stroke Sister    Stroke Sister    Hypertension Brother        2nd brother has also   Colon cancer Neg Hx    Esophageal cancer Neg Hx    Rectal cancer Neg Hx    Stomach cancer Neg Hx    Colon polyps Neg Hx     Medications:   Current Outpatient Medications on File Prior to Visit  Medication Sig Dispense Refill   amLODipine (NORVASC) 2.5 MG tablet Take 1 tablet (2.5 mg total) by mouth at bedtime. 90 tablet 2   aspirin EC 81 MG tablet Take 1 tablet (81 mg total) by mouth daily. Swallow whole. 30 tablet 11   esomeprazole (NEXIUM) 40 MG capsule TAKE 1 CAPSULE (40 MG TOTAL) BY MOUTH 2 (TWO) TIMES DAILY BEFORE A MEAL. (Patient taking differently: Take 40 mg by mouth daily.) 180 capsule 1   lovastatin (MEVACOR) 20 MG tablet 1 tab every other day. 45 tablet 1   Oxycodone HCl 10 MG TABS Take 1 tablet  (10 mg total) by mouth 2 (two) times daily. 60 tablet 0   traZODone (DESYREL) 50 MG tablet TAKE 1 TABLET AT BEDTIME ASNEEDED FOR SLEEP 90 tablet 2   No current facility-administered medications on file prior to visit.    Allergies:  No Known Allergies    OBJECTIVE:  Physical Exam  Vitals:   09/29/22 1315  BP: 118/79  Pulse: 60  Weight: 231 lb (104.8 kg)  Height: 6' 2.75" (1.899 m)   Body mass index is 29.07 kg/m. No results found.   General: well developed, well nourished, pleasant middle-aged African-American male, seated, in no evident distress Head: head normocephalic and atraumatic.   Neck: supple with no carotid or supraclavicular bruits Cardiovascular: regular rate and rhythm, no murmurs Musculoskeletal: no deformity Skin:  no rash/petichiae Vascular:  Normal pulses all extremities   Neurologic Exam Mental Status: Awake and fully alert. moderate dysarthria.  No evidence of aphasia.  Oriented to place and time. Recent and remote memory intact. Attention span, concentration and fund of knowledge appropriate during visit. Mood and affect appropriate.  Diminished recall 2/3.  Able to name only 9 animals which can walk on 4 legs.  Clock drawing 4/4.  Mental status exam not repeated today    07/13/2022    1:11 PM  MMSE - Mini Mental State Exam  Orientation to time 4  Orientation to Place 5  Registration 3  Attention/ Calculation 1  Recall 1  Language- name 2 objects 2  Language- repeat 1  Language- follow 3 step command 3  Language- read & follow direction 1  Write a sentence 1  Copy design 0  Total score 22   Cranial Nerves: Pupils equal, briskly reactive to light. Extraocular movements full without nystagmus. Visual fields full to confrontation. Hearing intact. Facial sensation intact. Face, tongue, palate moves normally and symmetrically.  Motor: Normal bulk and tone. Normal strength in all tested extremity muscles except mild decreased left hand dexterity.   Tone is increased on the left compared to the right.  Left leg spasticity with 4+/5 weakness left ankle dorsiflexors. Sensory.: intact to touch , pinprick , position and vibratory sensation.  Coordination: Rapid alternating movements normal in all extremities except slightly decreased left hand. Finger-to-nose performed accurately RUE and heel-to-shin show mild LLE ataxia. Gait and Station: Arises from chair without difficulty. Stance is normal. Gait demonstrates  hemiplegic gait with dragging of the left leg and spasticity use of cane.   Tandem walk not attempted Reflexes: 1+ and symmetric. Toes downgoing.         ASSESSMENT/PLAN: Richard Vaughn is a 71 y.o. year old male with ICH  in right thalamus/posterior limb likely secondary to HTN in 02/2018 with residual mild left-sided weakness, mild dysarthria, gait impairment and mild cognitive impairment  Vascular risk factors include multiple prior strokes including lacunar infarcts and small hypertensive bleeds, HTN, HLD, PAD, sleep apnea, history of substance abuse and family history of stroke.  He has mild memory difficulties due to poststroke mild cognitive impairment which appears stable..  New complaints of worsening balance and frequent falls which need evaluation   I had a long discussion with the patient and his wife regarding his new concerns of frequent falls and recommend further evaluation by checking MRI scan of the brain to rule out interval new stroke as well as MRI scan of the cervical spine compressive etiology.  We discussed fall prevention precautions and I advised her to use a cane at all times while walking.  Continue aspirin for stroke prevention and good control of hypertension with blood pressure goal below 130/90 cholesterol goal below 70 mg percent.  Encouraged him to increase participation in cognitively challenging and like solving crossword puzzles, playing bridge and sudoku for his memory we also discussed memory  compensation strategies.  Return for follow-up with [facility] practitioner in 6 months or call earlier if necessary. Greater than 50% time during this 40-minute visit was spent on counseling and coordination of care about his frequent falls, memory loss and history of intracerebral hemorrhage    Antony Contras, MD [facility] [address] [place], [place] [address]  Phone [phone] Fax [phone] Note: This document was prepared with digital dictation and possible smart phrase technology. Any transcriptional errors that result from this process are unintentional.

## 2022-09-29 NOTE — Patient Instructions (Signed)
I had a long discussion with the patient and his wife regarding his new concerns of frequent falls and recommend further evaluation by checking MRI scan of the brain to rule out interval new stroke as well as MRI scan of the cervical spine compressive etiology.  We discussed fall prevention precautions and I advised him to use a cane at all times while walking.  3.  Prevention and good control of hypertension with blood pressure goal below 130/90 cholesterol goal below 70 mg percent.  Encouraged him to increase participation in cognitively challenging and like solving crossword puzzles, playing bridge and sudoku for his memory we also discussed memory compensation strategies.  Return for follow-up with [facility] practitioner in 6 months or call earlier if necessary.  Fall Prevention in the Home, Adult Falls can cause injuries and can happen to people of all ages. There are many things you can do to make your home safe and to help prevent falls. Ask for help when making these changes. What actions can I take to prevent falls? General Instructions Use good lighting in all rooms. Replace any light bulbs that burn out. Turn on the lights in dark areas. Use night-lights. Keep items that you use often in easy-to-reach places. Lower the shelves around your home if needed. Set up your furniture so you have a clear path. Avoid moving your furniture around. Do not have throw rugs or other things on the floor that can make you trip. Avoid walking on wet floors. If any of your floors are uneven, fix them. Add color or contrast paint or tape to clearly mark and help you see: Grab bars or handrails. First and last steps of staircases. Where the edge of each step is. If you use a stepladder: Make sure that it is fully opened. Do not climb a closed stepladder. Make sure the sides of the stepladder are locked in place. Ask someone to hold the stepladder while you use it. Know where your pets are when moving  through your home. What can I do in the bathroom?     Keep the floor dry. Clean up any water on the floor right away. Remove soap buildup in the tub or shower. Use nonskid mats or decals on the floor of the tub or shower. Attach bath mats securely with double-sided, nonslip rug tape. If you need to sit down in the shower, use a plastic, nonslip stool. Install grab bars by the toilet and in the tub and shower. Do not use towel bars as grab bars. What can I do in the bedroom? Make sure that you have a light by your bed that is easy to reach. Do not use any sheets or blankets for your bed that hang to the floor. Have a firm chair with side arms that you can use for support when you get dressed. What can I do in the kitchen? Clean up any spills right away. If you need to reach something above you, use a step stool with a grab bar. Keep electrical cords out of the way. Do not use floor polish or wax that makes floors slippery. What can I do with my stairs? Do not leave any items on the stairs. Make sure that you have a light switch at the top and the bottom of the stairs. Make sure that there are handrails on both sides of the stairs. Fix handrails that are broken or loose. Install nonslip stair treads on all your stairs. Avoid having throw rugs at  the top or bottom of the stairs. Choose a carpet that does not hide the edge of the steps on the stairs. Check carpeting to make sure that it is firmly attached to the stairs. Fix carpet that is loose or worn. What can I do on the outside of my home? Use bright outdoor lighting. Fix the edges of walkways and driveways and fix any cracks. Remove anything that might make you trip as you walk through a door, such as a raised step or threshold. Trim any bushes or trees on paths to your home. Check to see if handrails are loose or broken and that both sides of all steps have handrails. Install guardrails along the edges of any raised decks and  porches. Clear paths of anything that can make you trip, such as tools or rocks. Have leaves, snow, or ice cleared regularly. Use sand or salt on paths during winter. Clean up any spills in your garage right away. This includes grease or oil spills. What other actions can I take? Wear shoes that: Have a low heel. Do not wear high heels. Have rubber bottoms. Feel good on your feet and fit well. Are closed at the toe. Do not wear open-toe sandals. Use tools that help you move around if needed. These include: Canes. Walkers. Scooters. Crutches. Review your medicines with your doctor. Some medicines can make you feel dizzy. This can increase your chance of falling. Ask your doctor what else you can do to help prevent falls. Where to find more information Centers for Disease Control and Prevention, STEAD[employer]: http://www.wolf.info/ National Institute on Aging: http://kim-miller.com/ Contact a doctor if: You are afraid of falling at home. You feel weak, drowsy, or dizzy at home. You fall at home. Summary There are many simple things that you can do to make your home safe and to help prevent falls. Ways to make your home safe include removing things that can make you trip and installing grab bars in the bathroom. Ask for help when making these changes in your home. This information is not intended to replace advice given to you by your health care provider. Make sure you discuss any questions you have with your health care provider. Document Revised: 08/03/2021 Document Reviewed: 06/04/2020 Elsevier Patient Education  [place].

## 2022-09-29 NOTE — Telephone Encounter (Signed)
medicare/BCBS federal NPR sent to [facility] [phone] 

## 2022-09-30 ENCOUNTER — Other Ambulatory Visit: Payer: Self-pay | Admitting: Family Medicine

## 2022-09-30 DIAGNOSIS — G8929 Other chronic pain: Secondary | ICD-10-CM

## 2022-09-30 DIAGNOSIS — G894 Chronic pain syndrome: Secondary | ICD-10-CM

## 2022-10-03 ENCOUNTER — Encounter: Payer: Self-pay | Admitting: Gastroenterology

## 2022-10-04 ENCOUNTER — Encounter: Payer: Self-pay | Admitting: Family Medicine

## 2022-10-04 MED ORDER — OXYCODONE HCL 10 MG PO TABS: 10.0000 mg | ORAL_TABLET | Freq: Two times a day (BID) | ORAL | 0 refills | Status: DC

## 2022-10-05 ENCOUNTER — Ambulatory Visit: Payer: Medicare Other

## 2022-10-05 ENCOUNTER — Ambulatory Visit (INDEPENDENT_AMBULATORY_CARE_PROVIDER_SITE_OTHER): Payer: Medicare Other

## 2022-10-05 DIAGNOSIS — Z23 Encounter for immunization: Secondary | ICD-10-CM

## 2022-10-12 ENCOUNTER — Ambulatory Visit: Payer: Medicare Other

## 2022-10-18 NOTE — Progress Notes (Signed)
10/20/2022 Richard Vaughn [id number] 03-Nov-1951  Referring provider: Martin[place], [name], MD Primary GI doctor: Dr. [name]  ASSESSMENT AND PLAN:   71 year old male with history of multiple CVAs, most recent 2021, history of dysphagia last SLP 2021 mild aspiration risk.   Patient also has history of Barrett's esophagus, denies GERD at this time only on Nexium once daily. Presents with what sounds like oropharyngeal dysphagia with issues with liquids, more intermittent, some issues even with drooling most likely this may be from his stroke -will start with a barium swallow, ultimately patient may need modified barium swallow. -Will increase Nexium 40 mg from once daily to twice daily. -Last EGD 2020 with Barrett's esophagus, no dysplasia, recall 3 to 5 years. -Patient has had colonoscopy in April EGD was not done, was not having swallowing issues at that time though. -Can discuss repeat EGD however I think this would be more low yield, else that the barium swallow shows. -Dysphagia information given.  Constipation secondary medications/immobility Started on MiraLAX, could consider Movantik/Amitiza. - Increase fiber/ water intake, decrease caffeine, increase activity level.  Gastroparesis Does not appear to be an issue at this time, gastroparesis diet given.  History of Present Illness:  71 y.o. 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  EGD 01/25/2013 - irregular z-line EGD 01/29/2016 - 2cm hiatal hernia, irregular z-line, mild gastritis - path c/w BE without dysplasia, stable from last exam, biopsies negative for HP Colonoscopy 12/20/2014 - 2 small adenomas, diverticulosis -  Gastric emptying study  02/19/2009 - delayed gastric emptying 01/26/2019 EGD for personal history of Barrett's esophagus showed Z-line irregular 41 cm incisors, normal esophagus otherwise, mild erythematous mucosa gastric body and antrum, normal duodenum bulb.  Recall 3 to 5 years.  Negative H. pylori. 02/23/2020 SLP mild aspiration risk but rare unremarkable.  02/17/2022 colonoscopy personal history of colonic polyps 2 flat polyp cecum 2 to 3 mm size, 3 mm polyp ascending colon, 3 mm polyp transverse colon, diverticulosis sigmoid, internal hemorrhoids.  Pathology showed adenomatous.  Recall 5 years.  Patient is on Nexium for longstanding GERD and Barrett's esophagus. GES 2010 delayed gastric emptying EGD 2020 with Barrett's esophagus, gastritis negative H. pylori. Takes oxycodone for chronic back pain, had UC visit 03/2022 for constipation.  Richard Vaughn his wife is here with him, walks with cane., follows with Dr. Leonie Man for frequent falls.   He just had colonoscopy 02/2022, he was not having any swallowing issues at that time.  Wife gives a lot of the history.  States would have be eating, feel need to cough, then regurgitate up foods. No nausea at this time. This is happening more frequently.  Has daily drooling or leaking of saliva since the CVA.  He has dental implants.  Then in last several months would have issues with drinking water, rice, worse thin iiquids.  States he can eat meat well if she cuts it into small enough pieces.  Feels it goes down the wrong.  He denies AB pain, GERD. Only on the nexium once a day.  No history of pneumonia. He has history of constipation, BM once a week, no straining, no blood, no melena.  He remains on oxycodone 5 mg twice.  Central sleep apnea, stopped several years ago.  Wt Readings from Last 8 Encounters:  10/20/22 231 lb (104.8 kg)  09/29/22 231 lb (104.8 kg)  07/13/22 230 lb 4 oz (104.4 kg)  06/08/22 228 lb 6 oz (103.6 kg)  04/21/22 227 lb 1.6 oz (103 kg)  02/17/22 233 lb  (105.7 kg)  01/29/22 230 lb 8 oz (104.6 kg)  01/11/22 233 lb 12.8 oz (106.1 kg)     He  reports that he quit smoking about 4 years ago. His smoking use included cigarettes. He has a 15.00 pack-year smoking history. He has never used smokeless tobacco. He reports that he does not drink alcohol and does not use drugs. His family history includes Hypertension in his brother, father, mother, and sister; Liver cancer in his mother; Stroke in his father, sister, and sister.   Current Medications:    Current Outpatient Medications (Cardiovascular):    amLODipine (NORVASC) 2.5 MG tablet, Take 1 tablet (2.5 mg total) by mouth at bedtime.   lovastatin (MEVACOR) 20 MG tablet, 1 tab every other day.   Current Outpatient Medications (Analgesics):    aspirin EC 81 MG tablet, Take 1 tablet (81 mg total) by mouth daily. Swallow whole.   Oxycodone HCl 10 MG TABS, Take 1 tablet (10 mg total) by mouth 2 (two) times daily.   Current Outpatient Medications (Other):    traZODone (DESYREL) 50 MG tablet, TAKE 1 TABLET AT BEDTIME ASNEEDED FOR SLEEP   esomeprazole (NEXIUM) 40 MG capsule, Take 1 capsule (40 mg total) by mouth 2 (two) times daily before a meal.  Surgical History:  He  has a past surgical history that includes Lumbar disc surgery (2005, 2010); Cervical disc surgery (2012); Upper gastrointestinal endoscopy; Colonoscopy; and Shoulder arthroscopy with rotator cuff repair (Left, 08/14/2020).  Current Medications, Allergies, Past Medical History, Past Surgical History, Family History and Social History were reviewed in [employer] record.  Physical Exam: BP 138/88   Pulse 64   Ht 6' 2.75" (1.899 m)   Wt 231 lb (104.8 kg)   SpO2 98%   BMI 29.07 kg/m  General:   No acute distress, well-developed, pleasant, walks with cane Heart : Regular rate and rhythm; no murmurs Pulm: Clear anteriorly; no wheezing Abdomen:  Soft, Obese AB, Sluggish bowel sounds. No tenderness .  Without guarding and Without rebound, No organomegaly appreciated. Rectal: Not evaluated Extremities:  without  edema. Neurologic: Dysarthria, no evidence of aphasia, oriented to place, time, person.  Left-sided weakness, walks with cane. Psych:  Cooperative. Normal mood and affect.   Vladimir Crofts, PA-C 10/20/22

## 2022-10-20 ENCOUNTER — Encounter: Payer: Self-pay | Admitting: Physician Assistant

## 2022-10-20 ENCOUNTER — Ambulatory Visit (INDEPENDENT_AMBULATORY_CARE_PROVIDER_SITE_OTHER): Payer: Medicare Other | Admitting: Physician Assistant

## 2022-10-20 VITALS — BP 138/88 | HR 64 | Ht 74.75 in | Wt 231.0 lb

## 2022-10-20 DIAGNOSIS — I69391 Dysphagia following cerebral infarction: Secondary | ICD-10-CM | POA: Diagnosis not present

## 2022-10-20 DIAGNOSIS — I69398 Other sequelae of cerebral infarction: Secondary | ICD-10-CM | POA: Diagnosis not present

## 2022-10-20 DIAGNOSIS — K2270 Barrett's esophagus without dysplasia: Secondary | ICD-10-CM

## 2022-10-20 DIAGNOSIS — K3184 Gastroparesis: Secondary | ICD-10-CM | POA: Diagnosis not present

## 2022-10-20 DIAGNOSIS — I619 Nontraumatic intracerebral hemorrhage, unspecified: Secondary | ICD-10-CM

## 2022-10-20 DIAGNOSIS — G4737 Central sleep apnea in conditions classified elsewhere: Secondary | ICD-10-CM | POA: Diagnosis not present

## 2022-10-20 DIAGNOSIS — R6881 Early satiety: Secondary | ICD-10-CM

## 2022-10-20 DIAGNOSIS — R1312 Dysphagia, oropharyngeal phase: Secondary | ICD-10-CM | POA: Diagnosis not present

## 2022-10-20 DIAGNOSIS — K219 Gastro-esophageal reflux disease without esophagitis: Secondary | ICD-10-CM | POA: Diagnosis not present

## 2022-10-20 MED ORDER — ESOMEPRAZOLE MAGNESIUM 40 MG PO CPDR: 40.0000 mg | DELAYED_RELEASE_CAPSULE | Freq: Two times a day (BID) | ORAL | 1 refills | Status: DC

## 2022-10-20 NOTE — Progress Notes (Signed)
Agree with assessment and plan as outlined.  

## 2022-10-20 NOTE — Patient Instructions (Signed)
_______________________________________________________  If you are age 69 or older, your body mass index should be between 23-30. Your Body mass index is 29.07 kg/m. If this is out of the aforementioned range listed, please consider follow up with your Primary Care Provider.  If you are age 40 or younger, your body mass index should be between 19-25. Your Body mass index is 29.07 kg/m. If this is out of the aformentioned range listed, please consider follow up with your Primary Care Provider.   ________________________________________________________  The [facility] providers would like to encourage you to use [facility] to communicate with providers for non-urgent requests or questions.  Due to long hold times on the telephone, sending your provider a message by [facility] may be a faster and more efficient way to get a response.  Please allow 48 business hours for a response.  Please remember that this is for non-urgent requests.  _______________________________________________________  Dennis Bast have been scheduled for a Barium Esophogram at [facility] (1st floor of the hospital) on 10-28-2022 at 9am. Please arrive 30 minutes prior to your appointment for registration. Make certain not to have anything to eat or drink 3 hours prior to your test. If you need to reschedule for any reason, please contact radiology at [phone] to do so. __________________________________________________________________ A barium swallow is an examination that concentrates on views of the esophagus. This tends to be a double contrast exam (barium and two liquids which, when combined, create a gas to distend the wall of the oesophagus) or single contrast (non-ionic iodine based). The study is usually tailored to your symptoms so a good history is essential. Attention is paid during the study to the form, structure and configuration of the esophagus, looking for functional disorders (such as aspiration, dysphagia,  achalasia, motility and reflux) EXAMINATION You may be asked to change into a gown, depending on the type of swallow being performed. A radiologist and radiographer will perform the procedure. The radiologist will advise you of the type of contrast selected for your procedure and direct you during the exam. You will be asked to stand, sit or lie in several different positions and to hold a small amount of fluid in your mouth before being asked to swallow while the imaging is performed .In some instances you may be asked to swallow barium coated marshmallows to assess the motility of a solid food bolus. The exam can be recorded as a digital or video fluoroscopy procedure. POST PROCEDURE It will take 1-2 days for the barium to pass through your system. To facilitate this, it is important, unless otherwise directed, to increase your fluids for the next 24-48hrs and to resume your normal diet.  This test typically takes about 30 minutes to perform. __________________________________________________________________________________  Due to recent changes in healthcare laws, you may see the results of your imaging and laboratory studies on MyChart before your provider has had a chance to review them.  We understand that in some cases there may be results that are confusing or concerning to you. Not all laboratory results come back in the same time frame and the provider may be waiting for multiple results in order to interpret others.  Please give [place] 48 hours in order for your provider to thoroughly review all the results before contacting the office for clarification of your results.    Please take your proton pump inhibitor medication, INCREASE NEXIUM TWICE a day  Please take this medication 30 minutes to 1 hour before meals- this makes it more  effective.  Avoid spicy and acidic foods Avoid fatty foods Limit your intake of coffee, tea, alcohol, and carbonated drinks Work to maintain a healthy  weight Keep the head of the bed elevated at least 3 inches with blocks or a wedge pillow if you are having any nighttime symptoms Stay upright for 2 hours after eating Avoid meals and snacks three to four hours before bedtime  Miralax is an osmotic laxative.  It only brings more water into the stool.  This is safe to take daily.  Can take up to 17 gram of miralax twice a day.  Mix with juice or coffee.  Start 1 capful at night for 3-4 days and reassess your response in 3-4 days.  You can increase and decrease the dose based on your response.  Remember, it can take up to 3-4 days to take effect OR for the effects to wear off.   I often pair this with benefiber in the morning to help assure the stool is not too loose.   Recommend starting on a fiber supplement, can try metamucil first but if this causes gas/bloating switch to benefiber or citracel, these do not cause gas.  Take with fiber with with a full 8 oz glass of water once a day. This can take 1 month to start helping, so try for at least one month.  Recommend increasing water and physical activity.   - Drink at least 64-80 ounces of water/liquid per day. - Establish a time to try to move your bowels every day.  For many people, this is after a cup of coffee or after a meal such as breakfast. - Sit all of the way back on the toilet keeping your back fairly straight and while sitting up, try to rest the tops of your forearms on your upper thighs.   - Raising your feet with a step stool/squatty potty can be helpful to improve the angle that allows your stool to pass through the rectum. - Relax the rectum feeling it bulge toward the toilet water.  If you feel your rectum raising toward your body, you are contracting rather than relaxing. - Breathe in and slowly exhale. "Belly breath" by expanding your belly towards your belly button. Keep belly expanded as you gently direct pressure down and back to the anus.  A low pitched GRRR sound can  assist with increasing intra-abdominal pressure.  - Repeat 3-4 times. If unsuccessful, contract the pelvic floor to restore normal tone and get off the toilet.  Avoid excessive straining. - To reduce excessive wiping by teaching your anus to normally contract, place hands on outer aspect of knees and resist knee movement outward.  Hold 5-10 second then place hands just inside of knees and resist inward movement of knees.  Hold 5 seconds.  Repeat a few times each way.  Go to the ER if unable to pass gas, severe AB pain, unable to hold down food, any shortness of breath of chest pain.   It was a pleasure to see you today!  Thank you for trusting me with your gastrointestinal care!

## 2022-10-21 ENCOUNTER — Encounter: Payer: Self-pay | Admitting: Family Medicine

## 2022-10-25 ENCOUNTER — Ambulatory Visit: Payer: Federal, State, Local not specified - PPO | Admitting: Podiatry

## 2022-10-28 ENCOUNTER — Other Ambulatory Visit (HOSPITAL_COMMUNITY): Payer: Medicare Other

## 2022-11-01 ENCOUNTER — Ambulatory Visit
Admission: RE | Admit: 2022-11-01 | Discharge: 2022-11-01 | Disposition: A | Discharge status: Home / Self Care | Payer: Medicare Other | Source: Ambulatory Visit | Attending: Neurology | Admitting: Neurology

## 2022-11-01 ENCOUNTER — Other Ambulatory Visit: Payer: Self-pay | Admitting: Family Medicine

## 2022-11-01 DIAGNOSIS — G4700 Insomnia, unspecified: Secondary | ICD-10-CM

## 2022-11-01 DIAGNOSIS — G894 Chronic pain syndrome: Secondary | ICD-10-CM

## 2022-11-01 DIAGNOSIS — R296 Repeated falls: Secondary | ICD-10-CM | POA: Diagnosis not present

## 2022-11-01 MED ORDER — GADOPICLENOL 0.5 MMOL/ML IV SOLN
10.0000 mL | Freq: Once | INTRAVENOUS | Status: AC | PRN
  Administered 2022-11-01: 10 mL via INTRAVENOUS

## 2022-11-01 MED ORDER — DULOXETINE HCL 60 MG PO CPEP: 60.0000 mg | ORAL_CAPSULE | Freq: Every day | ORAL | 1 refills | Status: DC

## 2022-11-01 MED ORDER — TRAZODONE HCL 50 MG PO TABS: 25.0000 mg | ORAL_TABLET | Freq: Every evening | ORAL | 2 refills | Status: DC | PRN

## 2022-11-02 ENCOUNTER — Other Ambulatory Visit: Payer: Self-pay

## 2022-11-02 ENCOUNTER — Telehealth: Payer: Self-pay

## 2022-11-02 ENCOUNTER — Ambulatory Visit (HOSPITAL_COMMUNITY)
Admission: RE | Admit: 2022-11-02 | Discharge: 2022-11-02 | Disposition: A | Discharge status: Home / Self Care | Payer: Medicare Other | Source: Ambulatory Visit | Attending: Physician Assistant | Admitting: Physician Assistant

## 2022-11-02 DIAGNOSIS — R1312 Dysphagia, oropharyngeal phase: Secondary | ICD-10-CM

## 2022-11-02 DIAGNOSIS — K219 Gastro-esophageal reflux disease without esophagitis: Secondary | ICD-10-CM

## 2022-11-02 DIAGNOSIS — T17900A Unspecified foreign body in respiratory tract, part unspecified causing asphyxiation, initial encounter: Secondary | ICD-10-CM | POA: Diagnosis not present

## 2022-11-02 DIAGNOSIS — Z8673 Personal history of transient ischemic attack (TIA), and cerebral infarction without residual deficits: Secondary | ICD-10-CM

## 2022-11-02 DIAGNOSIS — K2270 Barrett's esophagus without dysplasia: Secondary | ICD-10-CM

## 2022-11-02 NOTE — Telephone Encounter (Signed)
Refer to imaging result note 11/02/22.

## 2022-11-02 NOTE — Telephone Encounter (Signed)
Received a call from [facility] to make [place], [place] aware that swallow study was terminated d/t patient aspirating. Radiologist is recommending a MBSS.

## 2022-11-02 NOTE — Progress Notes (Signed)
HPI: [name] is a 71 y.o. male with PMHx significant for CVA,HTN,HLD,depression,GERD,and OSA here today with his wife for follow up. He was last seen on 06/08/22.  Chronic back pain, he tried Duloxetine in the past, discontinued because he did not feel it helped. About a month ago he restarted Cymbalta 60 mg, ran out a week ago. His wife noted that it helped greatly with depression, crying spells resolved. He is not sure if it helps with pain.    11/03/2022    3:19 PM 06/08/2022    3:15 PM 04/21/2022   10:22 AM 01/29/2022    2:35 PM 12/09/2021    3:20 PM  Depression screen PHQ 2/9  Decreased Interest 0 2 0 1 2  Down, Depressed, Hopeless 1 1 0 1 1  PHQ - 2 Score 1 3 0 2 3  Altered sleeping 1 1  0 2  Tired, decreased energy 0 0  0 2  Change in appetite 0 0  1 1  Feeling bad or failure about yourself  2 0  0 1  Trouble concentrating 0 0  0 0  Moving slowly or fidgety/restless 0 0  0 0  Suicidal thoughts 1 0  0 1  PHQ-9 Score 5 4  3 10   Difficult doing work/chores Somewhat difficult Not difficult at all  Not difficult at all Somewhat difficult   He is on Oxycodone 10 mg bid prn, which helps with pain. He rates his pain level as a 6 out of 10 while on medication. He is able to move around the house and engage in some activities.  Insomnia: He is currently sleeping an average of seven hours per night and is taking Trazodone 50 mg at night.   HTN: He is taking amlodipine 2.5 mg daily and Olmesartan 20 mg daily. He reports that his blood pressure readings at home are consistent with the readings obtained during the visit. Negative for severe/frequent headache, visual changes, chest pain, dyspnea, palpitation, new focal weakness, or edema. CVA with residual left-sided hemiparesis,dysarthria,and dysphagia. He reports that he had a swallowing study that was aborted due to risk for aspiration, pending appt with speech therapy. Lab Results  Component Value Date   CREATININE 0.98  07/13/2022   BUN 10 07/13/2022   NA 141 07/13/2022   K 3.9 07/13/2022   CL 104 07/13/2022   CO2 24 07/13/2022   Review of Systems  Constitutional:  Positive for fatigue. Negative for activity change, appetite change, chills, fever and unexpected weight change.  Respiratory:  Negative for cough and wheezing.   Gastrointestinal:  Negative for abdominal pain, nausea and vomiting.  Genitourinary:  Negative for decreased urine volume, dysuria and hematuria.  Musculoskeletal:  Positive for gait problem.  Skin:  Negative for rash.  Neurological:  Positive for speech difficulty and weakness.  Psychiatric/Behavioral:  Negative for confusion and hallucinations.   See other pertinent positives and negatives in HPI.  Current Outpatient Medications on File Prior to Visit  Medication Sig Dispense Refill   amLODipine (NORVASC) 2.5 MG tablet Take 1 tablet (2.5 mg total) by mouth at bedtime. 90 tablet 2   aspirin EC 81 MG tablet Take 1 tablet (81 mg total) by mouth daily. Swallow whole. 30 tablet 11   DULoxetine (CYMBALTA) 60 MG capsule Take 1 capsule (60 mg total) by mouth daily. 90 capsule 1   esomeprazole (NEXIUM) 40 MG capsule Take 1 capsule (40 mg total) by mouth 2 (two) times daily before a meal. 180  capsule 1   lovastatin (MEVACOR) 20 MG tablet 1 tab every other day. 45 tablet 1   olmesartan (BENICAR) 20 MG tablet Take 20 mg by mouth daily.     traZODone (DESYREL) 50 MG tablet Take 0.5 tablets (25 mg total) by mouth at bedtime as needed for sleep. 45 tablet 2   No current facility-administered medications on file prior to visit.    Past Medical History:  Diagnosis Date   Barrett's esophagus    Chronic back pain    Depression    Gastritis    Mild   Gastroparesis    GERD (gastroesophageal reflux disease)    Headache    History of kidney stones    Hyperglycemia    Hypertension    Nephrolithiasis    Pneumonia    Covid pneumonia   Renal cyst    Sleep apnea    Stricture and stenosis  of esophagus    Stroke (HCC) 07/2015   Vision abnormalities    No Known Allergies  Social History   Socioeconomic History   Marital status: Married    Spouse name: Ferrell Monserrat   Number of children: 3   Years of education: Not on file   Highest education level: 12th grade  Occupational History   Occupation: retired    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[place] [employer]  Tobacco Use   Smoking status: Former    Packs/day: 0.50    Years: 30.00    Total pack years: 15.00    Types: Cigarettes    Quit date: 04/15/2018    Years since quitting: 4.5   Smokeless tobacco: Never  Vaping Use   Vaping Use: Never used  Substance and Sexual Activity   Alcohol use: No    Alcohol/week: 0.0 standard drinks of alcohol   Drug use: Never    Comment: last use 04/2018   Sexual activity: Yes  Other Topics Concern   Not on file  Social History Narrative   Not on file   Social Determinants of Health   Financial Resource Strain: Low Risk  (04/21/2022)   Overall Financial Resource Strain (CARDIA)    Difficulty of Paying Living Expenses: Not hard at all  Food Insecurity: No Food Insecurity (06/04/2022)   Hunger Vital Sign    Worried About Running Out of Food in the Last Year: Never true    Ran Out of Food in the Last Year: Never true  Transportation Needs: No Transportation Needs (06/04/2022)   PRAPARE - Administrator, Civil Service (Medical): No    Lack of Transportation (Non-Medical): No  Physical Activity: Sufficiently Active (06/04/2022)   Exercise Vital Sign    Days of Exercise per Week: 5 days    Minutes of Exercise per Session: 60 min  Stress: Stress Concern Present (06/04/2022)   [employer] of Occupational Health - Occupational Stress Questionnaire    Feeling of Stress : To some extent  Social Connections: Moderately Integrated (06/04/2022)   Social Connection and Isolation Panel [NHANES]    Frequency of Communication with Friends and Family: Once a week    Frequency of Social Gatherings with  Friends and Family: More than three times a week    Attends Religious Services: 1 to 4 times per year    Active Member of [employer] or Organizations: No    Attends Banker Meetings: Never    Marital Status: Married  Recent Concern: Social Connections - Moderately Isolated (04/21/2022)   Social Connection and Isolation Panel [NHANES]  Frequency of Communication with Friends and Family: More than three times a week    Frequency of Social Gatherings with Friends and Family: More than three times a week    Attends Religious Services: Never    Database administrator or Organizations: No    Attends Banker Meetings: Never    Marital Status: Married   Vitals:   11/03/22 1431  BP: 120/70  Pulse: 84  Resp: 16  SpO2: 99%   Body mass index is 29.26 kg/m. Physical Exam Vitals and nursing note reviewed.  Constitutional:      General: He is not in acute distress.    Appearance: He is well-developed.  HENT:     Head: Normocephalic and atraumatic.     Mouth/Throat:     Mouth: Mucous membranes are moist.  Eyes:     Conjunctiva/sclera: Conjunctivae normal.  Cardiovascular:     Rate and Rhythm: Normal rate. Rhythm irregular.     Heart sounds: No murmur heard.    Comments: PT pulses present Pulmonary:     Effort: Pulmonary effort is normal. No respiratory distress.     Breath sounds: Normal breath sounds.  Abdominal:     Palpations: Abdomen is soft. There is no mass.     Tenderness: There is no abdominal tenderness.  Skin:    General: Skin is warm.     Findings: No erythema or rash.  Neurological:     Mental Status: He is alert and oriented to person, place, and time.     Cranial Nerves: Dysarthria present.     Comments: Left-sided weakness. Unstable gait assisted by a cane.  Psychiatric:        Mood and Affect: Mood is depressed.   ASSESSMENT AND PLAN:  Mr. Jakobi was seen today for follow-up.  Diagnoses and all orders for this visit: Orders Placed This  Encounter  Procedures   Ambulatory referral to Physical Therapy   EKG 12-Lead   Chronic bilateral low back pain with right-sided sciatica Assessment & Plan: Problem is adequately controlled. Continue Oxycodone 10 mg bid. Fall precautions.  Orders: -     DULoxetine HCl; Take 1 capsule (60 mg total) by mouth daily for 15 days.  Dispense: 15 capsule; Refill: 0 -     oxyCODONE HCl; Take 1 tablet (10 mg total) by mouth 2 (two) times daily.  Dispense: 60 tablet; Refill: 0  Chronic pain syndrome Assessment & Plan: On Oxycodone 10 mg bid prn. Medication contract is current. PMP reviewed. Follow-up in 5 months, before if needed.  Orders: -     oxyCODONE HCl; Take 1 tablet (10 mg total) by mouth 2 (two) times daily.  Dispense: 60 tablet; Refill: 0  Insomnia, unspecified type Assessment & Plan: Problem is well controlled. Continue Trazodone 50 mg daily at bedtime. Side effects discussed and the risk for med interaction with Duloxetine. Good sleep hygiene to continue.   Essential hypertension Assessment & Plan: BP adequately controlled. Today mild irregular HR noted on auscultation, he has been asymptomatic. EKG showed SR with PAC.Normal axis and intervals,no acte ischemia. PAC's are new when compared with last EKG in 02/2020, rest no major changes. Continue Amlodipine 2.5 mg daily and Olmesartan 20 mg daily. Continue low-salt diet and monitoring BP regularly.  Orders: -     EKG 12-Lead  Frequent falls -     Ambulatory referral to Physical Therapy  Hemiparesis affecting left side as late effect of cerebrovascular accident [facility]) Assessment & Plan: Also with  residual dysphagia (follows with GI) and dysarthria.  Currently on Lovastatin and Aspirin. He uses a walker and has had a couple of falls. Fall precautions discussed. PT will be arranged.   Depression, major, recurrent, mild (HCC) Assessment & Plan: Improved with Duloxetine 60 mg daily, Rx to local pharmacy sent, he ran  out of medication.  Orders: -     DULoxetine HCl; Take 1 capsule (60 mg total) by mouth daily for 15 days.  Dispense: 15 capsule; Refill: 0   Return in about 5 months (around 04/04/2023) for chronic problems.  Iaan Oregel G. [place], MD  [facility]. [place] office.

## 2022-11-03 ENCOUNTER — Ambulatory Visit (INDEPENDENT_AMBULATORY_CARE_PROVIDER_SITE_OTHER): Payer: Medicare Other | Admitting: Family Medicine

## 2022-11-03 ENCOUNTER — Encounter: Payer: Self-pay | Admitting: Family Medicine

## 2022-11-03 VITALS — BP 120/70 | HR 84 | Resp 16 | Ht 74.75 in | Wt 232.5 lb

## 2022-11-03 DIAGNOSIS — G894 Chronic pain syndrome: Secondary | ICD-10-CM | POA: Diagnosis not present

## 2022-11-03 DIAGNOSIS — I639 Cerebral infarction, unspecified: Secondary | ICD-10-CM

## 2022-11-03 DIAGNOSIS — R296 Repeated falls: Secondary | ICD-10-CM | POA: Diagnosis not present

## 2022-11-03 DIAGNOSIS — M5441 Lumbago with sciatica, right side: Secondary | ICD-10-CM

## 2022-11-03 DIAGNOSIS — I69354 Hemiplegia and hemiparesis following cerebral infarction affecting left non-dominant side: Secondary | ICD-10-CM

## 2022-11-03 DIAGNOSIS — I10 Essential (primary) hypertension: Secondary | ICD-10-CM | POA: Diagnosis not present

## 2022-11-03 DIAGNOSIS — G4700 Insomnia, unspecified: Secondary | ICD-10-CM | POA: Diagnosis not present

## 2022-11-03 DIAGNOSIS — F330 Major depressive disorder, recurrent, mild: Secondary | ICD-10-CM

## 2022-11-03 DIAGNOSIS — G8929 Other chronic pain: Secondary | ICD-10-CM | POA: Diagnosis not present

## 2022-11-03 MED ORDER — DULOXETINE HCL 60 MG PO CPEP: 60.0000 mg | ORAL_CAPSULE | Freq: Every day | ORAL | 0 refills | Status: DC

## 2022-11-03 MED ORDER — OXYCODONE HCL 10 MG PO TABS: 10.0000 mg | ORAL_TABLET | Freq: Two times a day (BID) | ORAL | 0 refills | Status: DC

## 2022-11-03 NOTE — Assessment & Plan Note (Signed)
Problem is well controlled. Continue Trazodone 50 mg daily at bedtime. Side effects discussed and the risk for med interaction with Duloxetine. Good sleep hygiene to continue.

## 2022-11-03 NOTE — Patient Instructions (Addendum)
A few things to remember from today's visit:  Insomnia, unspecified type  Chronic pain syndrome - Plan: Oxycodone HCl 10 MG TABS  Chronic bilateral low back pain with right-sided sciatica - Plan: Oxycodone HCl 10 MG TABS  Essential hypertension - Plan: EKG 12-Lead  Frequent falls - Plan: Ambulatory referral to Physical Therapy  Hemiparesis affecting left side as late effect of cerebrovascular accident ([place])  No changes today. Continue Duloxetine. PT order placed.  If you need refills for medications you take chronically, please call your pharmacy. Do not use My Chart to request refills or for acute issues that need immediate attention. If you send a my chart message, it may take a few days to be addressed, specially if I am not in the office.  Please be sure medication list is accurate. If a new problem present, please set up appointment sooner than planned today.

## 2022-11-03 NOTE — Assessment & Plan Note (Signed)
On Oxycodone 10 mg bid prn. Medication contract is current. PMP reviewed. Follow-up in 5 months, before if needed.

## 2022-11-03 NOTE — Assessment & Plan Note (Signed)
Problem is adequately controlled. Continue Oxycodone 10 mg bid. Fall precautions.

## 2022-11-03 NOTE — Assessment & Plan Note (Addendum)
BP adequately controlled. Today mild irregular HR noted on auscultation, he has been asymptomatic. EKG showed SR with PAC.Normal axis and intervals,no acte ischemia. PAC's are new when compared with last EKG in 02/2020, rest no major changes. Continue Amlodipine 2.5 mg daily and Olmesartan 20 mg daily. Continue low-salt diet and monitoring BP regularly.

## 2022-11-03 NOTE — Assessment & Plan Note (Addendum)
Also with residual dysphagia (follows with GI) and dysarthria.  Currently on Lovastatin and Aspirin. He uses a walker and has had a couple of falls. Fall precautions discussed. PT will be arranged.

## 2022-11-03 NOTE — Assessment & Plan Note (Signed)
Improved with Duloxetine 60 mg daily, Rx to local pharmacy sent, he ran out of medication.

## 2022-11-04 ENCOUNTER — Telehealth (HOSPITAL_COMMUNITY): Payer: Self-pay | Admitting: *Deleted

## 2022-11-04 NOTE — Telephone Encounter (Signed)
Attempted to contact patient to schedule OP MBS. Left VM. RKEEL 

## 2022-11-05 ENCOUNTER — Telehealth (HOSPITAL_COMMUNITY): Payer: Self-pay

## 2022-11-05 ENCOUNTER — Other Ambulatory Visit (HOSPITAL_COMMUNITY): Payer: Self-pay | Admitting: *Deleted

## 2022-11-05 DIAGNOSIS — R059 Cough, unspecified: Secondary | ICD-10-CM

## 2022-11-05 DIAGNOSIS — R1310 Dysphagia, unspecified: Secondary | ICD-10-CM

## 2022-11-05 NOTE — Telephone Encounter (Signed)
Attempted to contact patient to schedule Modified Barium Swallow - left voicemail. 

## 2022-11-06 NOTE — Progress Notes (Signed)
Kindly inform the patient that MRI scan of the brain shows old areas of small strokes and bleeding in the brain.  No new or worrisome findings.

## 2022-11-06 NOTE — Progress Notes (Signed)
Kindly inform the patient that MRI scan study of the neck spine shows wear-and-tear changes between the third and the fourth and the fourth and the fifth vertebrae with narrowing of the bony openings through which the nerves come out but no significant spinal stenosis or spinal cord compression.

## 2022-11-12 ENCOUNTER — Ambulatory Visit (HOSPITAL_BASED_OUTPATIENT_CLINIC_OR_DEPARTMENT_OTHER): Payer: Medicare Other | Attending: Family Medicine | Admitting: Physical Therapy

## 2022-11-12 ENCOUNTER — Encounter (HOSPITAL_BASED_OUTPATIENT_CLINIC_OR_DEPARTMENT_OTHER): Payer: Self-pay | Admitting: Physical Therapy

## 2022-11-12 ENCOUNTER — Ambulatory Visit: Payer: Medicare Other | Admitting: Podiatry

## 2022-11-12 DIAGNOSIS — M5459 Other low back pain: Secondary | ICD-10-CM | POA: Diagnosis not present

## 2022-11-12 DIAGNOSIS — R296 Repeated falls: Secondary | ICD-10-CM | POA: Diagnosis not present

## 2022-11-12 DIAGNOSIS — M6281 Muscle weakness (generalized): Secondary | ICD-10-CM | POA: Insufficient documentation

## 2022-11-12 NOTE — Therapy (Signed)
OUTPATIENT PHYSICAL THERAPY LOWER EXTREMITY EVALUATION   Patient Name: Richard Vaughn MRN: [id number] DOB:09/15/1951, 71 y.o., male Today's Date: 11/12/2022  END OF SESSION:  PT End of Session - 11/12/22 1438     Visit Number 1    Number of Visits 16    Date for PT Re-Evaluation 01/07/23    PT Start Time 1430    PT Stop Time 1512    PT Time Calculation (min) 42 min    Activity Tolerance Patient tolerated treatment well    Behavior During Therapy WFL for tasks assessed/performed             Past Medical History:  Diagnosis Date   Barrett's esophagus    Chronic back pain    Depression    Gastritis    Mild   Gastroparesis    GERD (gastroesophageal reflux disease)    Headache    History of kidney stones    Hyperglycemia    Hypertension    Nephrolithiasis    Pneumonia    Covid pneumonia   Renal cyst    Sleep apnea    Stricture and stenosis of esophagus    Stroke ([place]) 07/2015   Vision abnormalities    Past Surgical History:  Procedure Laterality Date   CERVICAL [place] SURGERY  2012   COLONOSCOPY     LUMBAR [place] SURGERY  2005, 2010   replaced L4 and L5   SHOULDER ARTHROSCOPY WITH ROTATOR CUFF REPAIR Left 08/14/2020   Procedure: SHOULDER ARTHROSCOPY WITH ROTATOR CUFF REPAIR, SUBACROMIAL DECOMPRESSION;  Surgeon: Tania Ade, MD;  Location: [facility];  Service: Orthopedics;  Laterality: Left;   UPPER GASTROINTESTINAL ENDOSCOPY     Patient Active Problem List   Diagnosis Date Noted   Drug-induced constipation 06/08/2022   Hemiparesis affecting left side as late effect of cerebrovascular accident ([place]) 12/09/2021   Depression, major, recurrent, mild ([place])    Sleep disturbance    Urinary retention    Bradycardia    Essential hypertension    Temperature elevated    Left thyroid nodule    Treatment-emergent central sleep apnea 10/25/2018   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) 10/25/2018   Chronic pain syndrome    Chronic bilateral  low back pain    Benign essential HTN    Tobacco abuse    Hyperlipidemia    History of CVA with residual deficit    Dysphagia, post-stroke    ICH (intracerebral hemorrhage) ([place]) - R thalmic/PLIC d/t HTN 09/73/5329   Insomnia 07/13/2017   PAD (peripheral artery disease) ([place]) 07/13/2017   Hyperlipidemia with target LDL less than 70 07/13/2017   Stroke-like symptom 09/22/2016   Paresthesia 09/22/2016   CVA (cerebral vascular accident) ([place]) 09/22/2016   Cerebrovascular accident (CVA) due to thrombosis of left carotid artery ([place]) 09/23/2015   Primary snoring 09/23/2015   Hypersomnia with sleep apnea 09/23/2015   Obstructive sleep apnea 09/23/2015   Lacunar infarct, acute ([place]) 08/25/2015   Sleep apnea 08/25/2015   Dysarthria    CVD (cardiovascular disease) 08/02/2015   Acute hyperglycemia 92/42/6834   Systolic hypertension with cerebrovascular disease 12/27/2014   Epistaxis 07/28/2012   NEPHROLITHIASIS 05/23/2009   BARRETTS ESOPHAGUS 02/20/2009   Gastroparesis 02/20/2009   RADICULOPATHY 10/21/2008   GERD 10/08/2008   ESOPHAGITIS 08/15/2008   ESOPHAGEAL STRICTURE 08/15/2008   GASTRITIS, ACUTE 08/15/2008   RENAL CYST 08/14/2008   TOBACCO ABUSE 08/08/2008   HEMATURIA, MICROSCOPIC, HX OF 08/08/2008   PULMONARY NODULE 01/10/2008    PCP: Dr  Betty Martinique   REFERRING PROVIDER: Dr Betty Martinique   REFERRING DIAG:  Diagnosis  R29.6 (ICD-10-CM) - Frequent falls    THERAPY DIAG:  Repeated falls  Muscle weakness (generalized)  Other low back pain  Rationale for Evaluation and Treatment: Rehabilitation  ONSET DATE: increasing number of falls over the past few weeks  SUBJECTIVE:   SUBJECTIVE STATEMENT: The patient has a long history of balance disturbances following a stroke in 2016. He reports over the past several months the balance deficits have increased. He has had 4 major falls. 3 of which were falling backwards. The other fall was from tripping over an object. He is  using a cane right now 2nd to decreased ability to use his left arm.   PERTINENT HISTORY: Depression, chronic low back pain, Lumbar surgery 2005 , 2010 ; RTC repair 2021; CVA, HTN, PNA, Stroke 2016. Stroke effected Left UE,   PAIN:  Are you having pain? Yes: NPRS scale: 5/10 Pain location: middle of his lower back  Pain description: aching  Aggravating factors: bending  Relieving factors: rest   PRECAUTIONS: Fall  WEIGHT BEARING RESTRICTIONS: No  FALLS:  Has patient fallen in last 6 months? Yes. 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  LOWER EXTREMITY MMT:  MMT Right eval Left eval  Hip flexion 34.3 35.6  Hip extension    Hip abduction 47.0 41.7  Hip adduction    Hip internal rotation    Hip external rotation    Knee flexion    Knee extension 44.3 32.6  Ankle dorsiflexion    Ankle plantarflexion    Ankle inversion    Ankle eversion     (Blank rows = not tested) FUNCTIONAL TESTS:    4x sit to stand test 44 sec  Could not complete the 5th rep   GAIT: Shuffling gait; decreased hip flexion; lateral movement at the trunk   TODAY'S TREATMENT:                                                                                                                              DATE:   Reviewed POC going forward     PATIENT EDUCATION:  Education  details: HEP, Symptom management  Person educated: Patient Education method:  Explanation, Demonstration, Tactile cues, Verbal cues, and Handouts Education comprehension: verbalized understanding, returned demonstration, verbal cues required, tactile cues required, and needs further education  HOME EXERCISE PROGRAM:   ASSESSMENT:  CLINICAL IMPRESSION: Patient is a 71 year old male who presents to therapy with frequent falls lower back pain.  He has had 4 falls over the past 6 months.  3 of his falls backwards.  One of his falls he caught his foot on something fell.  Static balance and 5 times sit to stand test put him at a high fall risk at this time.  Not have enough time in this visit to assess the Berg.  We will assess next visit.  He has mild quad weakness and hip abductor weakness on the left compared to right.  He has been past CVA which affects his left side strength.  He has no functional use of his left arm.  Previous episode of physical therapy attempted to gain some type of functional use of the left arm with no success.  He has spasming of his lower back.  He has limited general lumbar mobility.  He stands in slight trunk flexion.  He would benefit from skilled therapy to reduce fall risk, improve ability to return to the gym, and decreased lower back pain with functional activity.  OBJECTIVE IMPAIRMENTS: Abnormal gait, decreased activity tolerance, decreased knowledge of use of DME, decreased mobility, difficulty walking, decreased ROM, decreased strength, decreased safety awareness, increased fascial restrictions, increased muscle spasms, impaired flexibility, postural dysfunction, and pain.   ACTIVITY LIMITATIONS: carrying, lifting, bending, standing, squatting, stairs, transfers, bed mobility, reach over head, and locomotion level  PARTICIPATION LIMITATIONS: meal prep, cleaning, laundry, driving, shopping, community activity, and yard work  PERSONAL FACTORS: 3+ comorbidities: Low  back Surgery, frequent falls; Past CVA   are also affecting patient's functional outcome.   REHAB POTENTIAL: Good  CLINICAL DECISION MAKING: Evolving/moderate complexity increasing falls  EVALUATION COMPLEXITY: Moderate   GOALS: Goals reviewed with patient? Yes  SHORT TERM GOALS: Target date: 12/10/2022   Patient will decrease 5x sit to stand time by 10 seconds and before all 5 reps.  Baseline: Goal status: INITIAL  2.  Patient will increase left quad and glut strength by 5 lbs  Baseline:  Goal status: INITIAL  3.  Patient will transfer sit to stand with SBA and decreased use of his hands  Baseline:  Goal status: INITIAL   LONG TERM GOALS: Target date: 01/07/2023    Patient will have no major falls over a month  Baseline:  Goal status: INITIAL  2.  Patients will score > 42 on the BERG to show a decreased fall risk  Baseline:  Goal status: INITIAL  3.  Patient will return to a full gym program  Baseline:  Goal status: INITIAL   PLAN:  PT FREQUENCY: 2x/week  PT DURATION: 8 weeks  PLANNED INTERVENTIONS: Therapeutic exercises, Therapeutic activity, Neuromuscular re-education, Balance training, Gait training, Patient/Family education, Self Care, Joint mobilization, Stair training, DME instructions, Aquatic Therapy, Spinal mobilization, Cryotherapy, Moist heat, Ultrasound, and Manual therapy  PLAN FOR NEXT SESSION: BERG first visit on land. In water work on posterior balance and lifting legs high. Work on the same things on land.  Review exercises machines at home.    Carney Living, PT 11/12/2022, 2:45 PM

## 2022-11-17 ENCOUNTER — Encounter (HOSPITAL_BASED_OUTPATIENT_CLINIC_OR_DEPARTMENT_OTHER): Payer: Self-pay | Admitting: Physical Therapy

## 2022-11-17 ENCOUNTER — Ambulatory Visit (HOSPITAL_BASED_OUTPATIENT_CLINIC_OR_DEPARTMENT_OTHER): Payer: Medicare Other | Attending: Family Medicine | Admitting: Physical Therapy

## 2022-11-17 DIAGNOSIS — R296 Repeated falls: Secondary | ICD-10-CM

## 2022-11-17 DIAGNOSIS — M5459 Other low back pain: Secondary | ICD-10-CM | POA: Insufficient documentation

## 2022-11-17 DIAGNOSIS — M6281 Muscle weakness (generalized): Secondary | ICD-10-CM

## 2022-11-17 NOTE — Therapy (Signed)
OUTPATIENT PHYSICAL THERAPY LOWER EXTREMITY EVALUATION   Patient Name: Richard Vaughn MRN: [id number] DOB:06-Sep-1951, 72 y.o., male Today's Date: 11/17/2022  END OF SESSION:  PT End of Session - 11/17/22 1622     Visit Number 2    Number of Visits 16    Date for PT Re-Evaluation 01/07/23    Authorization Type MCR    PT Start Time 1430    PT Stop Time 2505    PT Time Calculation (min) 45 min    Activity Tolerance Patient tolerated treatment well    Behavior During Therapy WFL for tasks assessed/performed              Past Medical History:  Diagnosis Date   Barrett's esophagus    Chronic back pain    Depression    Gastritis    Mild   Gastroparesis    GERD (gastroesophageal reflux disease)    Headache    History of kidney stones    Hyperglycemia    Hypertension    Nephrolithiasis    Pneumonia    Covid pneumonia   Renal cyst    Sleep apnea    Stricture and stenosis of esophagus    Stroke ([place]) 07/2015   Vision abnormalities    Past Surgical History:  Procedure Laterality Date   CERVICAL [place] SURGERY  2012   COLONOSCOPY     LUMBAR [place] SURGERY  2005, 2010   replaced L4 and L5   SHOULDER ARTHROSCOPY WITH ROTATOR CUFF REPAIR Left 08/14/2020   Procedure: SHOULDER ARTHROSCOPY WITH ROTATOR CUFF REPAIR, SUBACROMIAL DECOMPRESSION;  Surgeon: Tania Ade, MD;  Location: [facility];  Service: Orthopedics;  Laterality: Left;   UPPER GASTROINTESTINAL ENDOSCOPY     Patient Active Problem List   Diagnosis Date Noted   Drug-induced constipation 06/08/2022   Hemiparesis affecting left side as late effect of cerebrovascular accident ([place]) 12/09/2021   Depression, major, recurrent, mild ([place])    Sleep disturbance    Urinary retention    Bradycardia    Essential hypertension    Temperature elevated    Left thyroid nodule    Treatment-emergent central sleep apnea 10/25/2018   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) 10/25/2018   Chronic pain  syndrome    Chronic bilateral low back pain    Benign essential HTN    Tobacco abuse    Hyperlipidemia    History of CVA with residual deficit    Dysphagia, post-stroke    ICH (intracerebral hemorrhage) ([place]) - R thalmic/PLIC d/t HTN 39/76/7341   Insomnia 07/13/2017   PAD (peripheral artery disease) ([place]) 07/13/2017   Hyperlipidemia with target LDL less than 70 07/13/2017   Stroke-like symptom 09/22/2016   Paresthesia 09/22/2016   CVA (cerebral vascular accident) ([place]) 09/22/2016   Cerebrovascular accident (CVA) due to thrombosis of left carotid artery ([place]) 09/23/2015   Primary snoring 09/23/2015   Hypersomnia with sleep apnea 09/23/2015   Obstructive sleep apnea 09/23/2015   Lacunar infarct, acute ([place]) 08/25/2015   Sleep apnea 08/25/2015   Dysarthria    CVD (cardiovascular disease) 08/02/2015   Acute hyperglycemia 93/79/0240   Systolic hypertension with cerebrovascular disease 12/27/2014   Epistaxis 07/28/2012   NEPHROLITHIASIS 05/23/2009   BARRETTS ESOPHAGUS 02/20/2009   Gastroparesis 02/20/2009   RADICULOPATHY 10/21/2008   GERD 10/08/2008   ESOPHAGITIS 08/15/2008   ESOPHAGEAL STRICTURE 08/15/2008   GASTRITIS, ACUTE 08/15/2008   RENAL CYST 08/14/2008   TOBACCO ABUSE 08/08/2008   HEMATURIA, MICROSCOPIC, HX OF 08/08/2008   PULMONARY  NODULE 01/10/2008    PCP: Dr Betty Martinique   REFERRING PROVIDER: Dr Betty Martinique   REFERRING DIAG:  Diagnosis  R29.6 (ICD-10-CM) - Frequent falls    THERAPY DIAG:  Repeated falls  Muscle weakness (generalized)  Other low back pain  Rationale for Evaluation and Treatment: Rehabilitation  ONSET DATE: increasing number of falls over the past few weeks  SUBJECTIVE:   SUBJECTIVE STATEMENT:  Pt states he is doing well today. No more pain than usual into his back.   Eval: The patient has a long history of balance disturbances following a stroke in 2016. He reports over the past several months the balance deficits have increased.  He has had 4 major falls. 3 of which were falling backwards. The other fall was from tripping over an object. He is using a cane right now 2nd to decreased ability to use his left arm.   PERTINENT HISTORY: Depression, chronic low back pain, Lumbar surgery 2005 , 2010 ; RTC repair 2021; CVA, HTN, PNA, Stroke 2016. Stroke effected Left UE,   PAIN:  Are you having pain? Yes: NPRS scale: 5/10 Pain location: middle of his lower back  Pain description: aching  Aggravating factors: bending  Relieving factors: rest   PRECAUTIONS: Fall  WEIGHT BEARING RESTRICTIONS: No  FALLS:  Has patient fallen in last 6 months? Yes. 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 a week  PLOF: Independent with use of a cane   PATIENT GOALS:  To improve balance   NEXT MD VISIT:   OBJECTIVE:   DIAGNOSTIC FINDINGS: nothing recent   PATIENT SURVEYS:  FOTO    COGNITION: Overall cognitive status: Within functional limits for tasks assessed   BERG Balance Test          Date:   Sit to Stand 2  Standing unsupported 2  Sitting with back unsupported but feet supported 4  Stand to sit  2  Transfers  3  Standing unsupported with eyes closed 2  Standing unsupported feet together 3  From standing position, reach forward with outstretched arm 3  From standing position, pick up object from floor 3  From standing position, turn and look behind over each shoulder 3  Turn 360 2  Standing unsupported, alternately place foot on step 0  Standing unsupported, one foot in front 2  Standing on one leg 0  Total:  32/26     TODAY'S TREATMENT:                                                                                                                              DATE: 1/3  Gait belt throughout session   Standing rotation (simulation of fishing/casting motion) L and R 15x each way in staggered  stance Standing fwd reach with 1lb 2x10 (head hips relationship) Standing Airex EO NBOS  30s 3x NBOS EC on airex 30s 3x Retro walk at table 3x laps, focus on step length Standing marching with UE assist (focus on increase L and R stance times) 20x    PATIENT EDUCATION:  Education details: HEP, balance systems, compensatory measures Person educated: Patient Education method: Explanation, Demonstration, Tactile cues, Verbal cues, and Handouts Education comprehension: verbalized understanding, returned demonstration, verbal cues required, tactile cues required, and needs further education  HOME EXERCISE PROGRAM:   ASSESSMENT:  CLINICAL IMPRESSION: Patient did demonstrate significant fall risk as per Berg balance scale score of 32 out of 56 at today's session.  Patient session focused on dynamic stability, head hips relationship, and ability to increase stance time on bilateral lower extremities.  Patient without significant fatigue during session however was apprehensive with eyes closed positioning on first trial.  After multiple trials patient became more confident with all balance with activity.  Plan to continue with all land-based dynamic stability as tolerated with particular focus on improving righting reactions.  Patient would benefit from skilled therapy to reduce fall risk, improve ability to return to the gym, and decreased lower back pain with functional activity.  OBJECTIVE IMPAIRMENTS: Abnormal gait, decreased activity tolerance, decreased knowledge of use of DME, decreased mobility, difficulty walking, decreased ROM, decreased strength, decreased safety awareness, increased fascial restrictions, increased muscle spasms, impaired flexibility, postural dysfunction, and pain.   ACTIVITY LIMITATIONS: carrying, lifting, bending, standing, squatting, stairs, transfers, bed mobility, reach over head, and locomotion level  PARTICIPATION LIMITATIONS: meal prep, cleaning, laundry,  driving, shopping, community activity, and yard work  PERSONAL FACTORS: 3+ comorbidities: Low back Surgery, frequent falls; Past CVA   are also affecting patient's functional outcome.   REHAB POTENTIAL: Good  CLINICAL DECISION MAKING: Evolving/moderate complexity increasing falls  EVALUATION COMPLEXITY: Moderate   GOALS: Goals reviewed with patient? Yes  SHORT TERM GOALS: Target date: 12/10/2022   Patient will decrease 5x sit to stand time by 10 seconds and before all 5 reps.  Baseline: Goal status: INITIAL  2.  Patient will increase left quad and glut strength by 5 lbs  Baseline:  Goal status: INITIAL  3.  Patient will transfer sit to stand with SBA and decreased use of his hands  Baseline:  Goal status: INITIAL   LONG TERM GOALS: Target date: 01/07/2023    Patient will have no major falls over a month  Baseline:  Goal status: INITIAL  2.  Patients will score > 42 on the BERG to show a decreased fall risk  Baseline:  Goal status: INITIAL  3.  Patient will return to a full gym program  Baseline:  Goal status: INITIAL   PLAN:  PT FREQUENCY: 2x/week  PT DURATION: 8 weeks  PLANNED INTERVENTIONS: Therapeutic exercises, Therapeutic activity, Neuromuscular re-education, Balance training, Gait training, Patient/Family education, Self Care, Joint mobilization, Stair training, DME instructions, Aquatic Therapy, Spinal mobilization, Cryotherapy, Moist heat, Ultrasound, and Manual therapy  PLAN FOR NEXT SESSION:  In water work on posterior balance and lifting legs high. Work on the same things on land.  Review exercises machines at home.    Daleen Bo, PT 11/17/2022, 4:28 PM

## 2022-11-19 ENCOUNTER — Encounter: Payer: Self-pay | Admitting: Podiatry

## 2022-11-19 ENCOUNTER — Ambulatory Visit (INDEPENDENT_AMBULATORY_CARE_PROVIDER_SITE_OTHER): Payer: Medicare Other | Admitting: Podiatry

## 2022-11-19 DIAGNOSIS — M79674 Pain in right toe(s): Secondary | ICD-10-CM | POA: Diagnosis not present

## 2022-11-19 DIAGNOSIS — I739 Peripheral vascular disease, unspecified: Secondary | ICD-10-CM | POA: Diagnosis not present

## 2022-11-19 DIAGNOSIS — I639 Cerebral infarction, unspecified: Secondary | ICD-10-CM | POA: Diagnosis not present

## 2022-11-19 DIAGNOSIS — M79675 Pain in left toe(s): Secondary | ICD-10-CM | POA: Diagnosis not present

## 2022-11-19 DIAGNOSIS — B351 Tinea unguium: Secondary | ICD-10-CM

## 2022-11-19 NOTE — Progress Notes (Signed)
This patient returns to my office for at risk foot care.  This patient requires this care by a professional since this patient will be at risk due to having  CVA, PAD and hyperglycemia. He presents to the office with his wife.  This patient is unable to cut nails himself since the patient cannot reach his nails.These nails are painful walking and wearing shoes.  This patient presents for at risk foot care today.  General Appearance  Alert, conversant and in no acute stress.  Vascular  Dorsalis pedis and posterior tibial  pulses are palpable  bilaterally.  Capillary return is within normal limits  bilaterally. Temperature is within normal limits  bilaterally.  Neurologic  Senn-Weinstein monofilament wire test within normal limits  bilaterally. Muscle power within normal limits bilaterally.  Nails Thick disfigured discolored nails with subungual debris  from hallux to fifth toes bilaterally. No evidence of bacterial infection or drainage bilaterally.  Orthopedic  No limitations of motion  feet .  No crepitus or effusions noted.  Hammer toes  B/l.  Skin  normotropic skin with no porokeratosis noted bilaterally.  No signs of infections or ulcers noted.     Onychomycosis  Pain in right toes  Pain in left toes  Consent was obtained for treatment procedures.   Mechanical debridement of nails 1-5  bilaterally performed with a nail nipper.  Filed with dremel without incident.    Return office visit    10 weeks                 Told patient to return for periodic foot care and evaluation due to potential at risk complications.   Gardiner Barefoot DPM

## 2022-11-24 ENCOUNTER — Encounter (HOSPITAL_BASED_OUTPATIENT_CLINIC_OR_DEPARTMENT_OTHER): Payer: Self-pay | Admitting: Physical Therapy

## 2022-11-24 ENCOUNTER — Ambulatory Visit (HOSPITAL_BASED_OUTPATIENT_CLINIC_OR_DEPARTMENT_OTHER): Payer: Medicare Other | Admitting: Physical Therapy

## 2022-11-24 DIAGNOSIS — M5459 Other low back pain: Secondary | ICD-10-CM

## 2022-11-24 DIAGNOSIS — R296 Repeated falls: Secondary | ICD-10-CM | POA: Diagnosis not present

## 2022-11-24 DIAGNOSIS — M6281 Muscle weakness (generalized): Secondary | ICD-10-CM

## 2022-11-24 NOTE — Therapy (Signed)
OUTPATIENT PHYSICAL THERAPY LOWER EXTREMITY EVALUATION   Patient Name: Richard Vaughn MRN: [id number] DOB:Aug 27, 1951, 72 y.o., male Today's Date: 11/24/2022  END OF SESSION:  PT End of Session - 11/24/22 1600     Visit Number 3    Number of Visits 16    Date for PT Re-Evaluation 01/07/23    Authorization Type MCR    PT Start Time 6073    PT Stop Time 1512    PT Time Calculation (min) 33 min    Activity Tolerance Patient tolerated treatment well    Behavior During Therapy WFL for tasks assessed/performed               Past Medical History:  Diagnosis Date   Barrett's esophagus    Chronic back pain    Depression    Gastritis    Mild   Gastroparesis    GERD (gastroesophageal reflux disease)    Headache    History of kidney stones    Hyperglycemia    Hypertension    Nephrolithiasis    Pneumonia    Covid pneumonia   Renal cyst    Sleep apnea    Stricture and stenosis of esophagus    Stroke ([place]) 07/2015   Vision abnormalities    Past Surgical History:  Procedure Laterality Date   CERVICAL [place] SURGERY  2012   COLONOSCOPY     LUMBAR [place] SURGERY  2005, 2010   replaced L4 and L5   SHOULDER ARTHROSCOPY WITH ROTATOR CUFF REPAIR Left 08/14/2020   Procedure: SHOULDER ARTHROSCOPY WITH ROTATOR CUFF REPAIR, SUBACROMIAL DECOMPRESSION;  Surgeon: Tania Ade, MD;  Location: [facility];  Service: Orthopedics;  Laterality: Left;   UPPER GASTROINTESTINAL ENDOSCOPY     Patient Active Problem List   Diagnosis Date Noted   Drug-induced constipation 06/08/2022   Hemiparesis affecting left side as late effect of cerebrovascular accident ([place]) 12/09/2021   Depression, major, recurrent, mild ([place])    Sleep disturbance    Urinary retention    Bradycardia    Essential hypertension    Temperature elevated    Left thyroid nodule    Treatment-emergent central sleep apnea 10/25/2018   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) 10/25/2018   Chronic pain  syndrome    Chronic bilateral low back pain    Benign essential HTN    Tobacco abuse    Hyperlipidemia    History of CVA with residual deficit    Dysphagia, post-stroke    ICH (intracerebral hemorrhage) ([place]) - R thalmic/PLIC d/t HTN 71/04/2693   Insomnia 07/13/2017   PAD (peripheral artery disease) ([place]) 07/13/2017   Hyperlipidemia with target LDL less than 70 07/13/2017   Stroke-like symptom 09/22/2016   Paresthesia 09/22/2016   CVA (cerebral vascular accident) ([place]) 09/22/2016   Cerebrovascular accident (CVA) due to thrombosis of left carotid artery ([place]) 09/23/2015   Primary snoring 09/23/2015   Hypersomnia with sleep apnea 09/23/2015   Obstructive sleep apnea 09/23/2015   Lacunar infarct, acute ([place]) 08/25/2015   Sleep apnea 08/25/2015   Dysarthria    CVD (cardiovascular disease) 08/02/2015   Acute hyperglycemia 85/46/2703   Systolic hypertension with cerebrovascular disease 12/27/2014   Epistaxis 07/28/2012   NEPHROLITHIASIS 05/23/2009   BARRETTS ESOPHAGUS 02/20/2009   Gastroparesis 02/20/2009   RADICULOPATHY 10/21/2008   GERD 10/08/2008   ESOPHAGITIS 08/15/2008   ESOPHAGEAL STRICTURE 08/15/2008   GASTRITIS, ACUTE 08/15/2008   RENAL CYST 08/14/2008   TOBACCO ABUSE 08/08/2008   HEMATURIA, MICROSCOPIC, HX OF 08/08/2008  PULMONARY NODULE 01/10/2008    PCP: Dr Betty Martinique   REFERRING PROVIDER: Dr Betty Martinique   REFERRING DIAG:  Diagnosis  R29.6 (ICD-10-CM) - Frequent falls    THERAPY DIAG:  Repeated falls  Muscle weakness (generalized)  Other low back pain  Rationale for Evaluation and Treatment: Rehabilitation  ONSET DATE: increasing number of falls over the past few weeks  SUBJECTIVE:   SUBJECTIVE STATEMENT:  Pt states he is doing well today. Pt has usual back pain. He has still had trouble with walking uphill during his exercise with feeling unsteady.  Eval: The patient has a long history of balance disturbances following a stroke in 2016. He  reports over the past several months the balance deficits have increased. He has had 4 major falls. 3 of which were falling backwards. The other fall was from tripping over an object. He is using a cane right now 2nd to decreased ability to use his left arm.   PERTINENT HISTORY: Depression, chronic low back pain, Lumbar surgery 2005 , 2010 ; RTC repair 2021; CVA, HTN, PNA, Stroke 2016. Stroke effected Left UE,   PAIN:  Are you having pain? Yes: NPRS scale: 5/10 Pain location: middle of his lower back  Pain description: aching  Aggravating factors: bending  Relieving factors: rest   PRECAUTIONS: Fall  WEIGHT BEARING RESTRICTIONS: No  FALLS:  Has patient fallen in last 6 months? Yes. 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 a week  PLOF: Independent with use of a cane   PATIENT GOALS:  To improve balance   OBJECTIVE:    TODAY'S TREATMENT:                                                                                                                              DATE: 1/10  Gait belt throughout session  Seated LAQ 3lbs each LE 20x for active warm up to LE  Standing rotation (simulation of fishing/casting motion) L and R 20x each way in staggered stance Standing fwd reach with 4lb 2x10 (head hips relationship) Standing Airex EO NBOS 30s 3x NBOS EC on airex 30s 3x STS training with focus on getting COG over BOS, mechanics, set up, and sequencing for most effiency and safety     PATIENT EDUCATION:  Education details: HEP, balance systems, compensatory measures Person educated: Patient Education method: Explanation, Demonstration, Tactile cues, Verbal cues, and Handouts Education comprehension: verbalized understanding, returned demonstration, verbal cues required, tactile cues required, and needs further education  HOME EXERCISE PROGRAM:   ASSESSMENT:  CLINICAL  IMPRESSION: Majority of pt session focused on mechanics of transfers and STS given pt's LOB towards R side. Pt has decreased awareness of where COG is within BOS. Likely pt's lack of L quad strength causes L knee hyperextension and R sided LOB during STS. Pt gave verbal understanding  to [facility] for hip extension during standing but requires TC for sequencing and set up. Varying heights required due to eccentric and concentric strength deficits. Plan to review at next session as pt relies on spouse currently when in the community for assistance for STS. Pt also apprehensive with feeling weight shift outside of BOS. Patient would benefit from skilled therapy to reduce fall risk, improve ability to return to the gym, and decreased lower back pain with functional activity.  OBJECTIVE IMPAIRMENTS: Abnormal gait, decreased activity tolerance, decreased knowledge of use of DME, decreased mobility, difficulty walking, decreased ROM, decreased strength, decreased safety awareness, increased fascial restrictions, increased muscle spasms, impaired flexibility, postural dysfunction, and pain.   ACTIVITY LIMITATIONS: carrying, lifting, bending, standing, squatting, stairs, transfers, bed mobility, reach over head, and locomotion level  PARTICIPATION LIMITATIONS: meal prep, cleaning, laundry, driving, shopping, community activity, and yard work  PERSONAL FACTORS: 3+ comorbidities: Low back Surgery, frequent falls; Past CVA   are also affecting patient's functional outcome.   REHAB POTENTIAL: Good  CLINICAL DECISION MAKING: Evolving/moderate complexity increasing falls  EVALUATION COMPLEXITY: Moderate   GOALS: Goals reviewed with patient? Yes  SHORT TERM GOALS: Target date: 12/10/2022   Patient will decrease 5x sit to stand time by 10 seconds and before all 5 reps.  Baseline: Goal status: INITIAL  2.  Patient will increase left quad and glut strength by 5 lbs  Baseline:  Goal status: INITIAL  3.  Patient  will transfer sit to stand with SBA and decreased use of his hands  Baseline:  Goal status: INITIAL   LONG TERM GOALS: Target date: 01/07/2023    Patient will have no major falls over a month  Baseline:  Goal status: INITIAL  2.  Patients will score > 42 on the BERG to show a decreased fall risk  Baseline:  Goal status: INITIAL  3.  Patient will return to a full gym program  Baseline:  Goal status: INITIAL   PLAN:  PT FREQUENCY: 2x/week  PT DURATION: 8 weeks  PLANNED INTERVENTIONS: Therapeutic exercises, Therapeutic activity, Neuromuscular re-education, Balance training, Gait training, Patient/Family education, Self Care, Joint mobilization, Stair training, DME instructions, Aquatic Therapy, Spinal mobilization, Cryotherapy, Moist heat, Ultrasound, and Manual therapy  PLAN FOR NEXT SESSION:  In water work on posterior balance and lifting legs high. Work on the same things on land.  Review exercises machines at home.    Daleen Bo, PT 11/24/2022, 4:05 PM

## 2022-11-26 ENCOUNTER — Ambulatory Visit (HOSPITAL_BASED_OUTPATIENT_CLINIC_OR_DEPARTMENT_OTHER): Payer: Medicare Other | Admitting: Physical Therapy

## 2022-11-30 ENCOUNTER — Ambulatory Visit (HOSPITAL_BASED_OUTPATIENT_CLINIC_OR_DEPARTMENT_OTHER): Payer: Medicare Other | Admitting: Physical Therapy

## 2022-11-30 ENCOUNTER — Encounter (HOSPITAL_BASED_OUTPATIENT_CLINIC_OR_DEPARTMENT_OTHER): Payer: Self-pay | Admitting: Physical Therapy

## 2022-11-30 DIAGNOSIS — M6281 Muscle weakness (generalized): Secondary | ICD-10-CM

## 2022-11-30 DIAGNOSIS — M5459 Other low back pain: Secondary | ICD-10-CM | POA: Diagnosis not present

## 2022-11-30 DIAGNOSIS — R296 Repeated falls: Secondary | ICD-10-CM | POA: Diagnosis not present

## 2022-11-30 NOTE — Therapy (Signed)
OUTPATIENT PHYSICAL THERAPY LOWER EXTREMITY TREATMENT   Patient Name: Richard Vaughn MRN: [id number] DOB:1951-02-23, 72 y.o., male Today's Date: 11/30/2022  END OF SESSION:  PT End of Session - 11/30/22 1614     Visit Number 4    Number of Visits 16    Date for PT Re-Evaluation 01/07/23    Authorization Type MCR    PT Start Time 1609   arrives late   PT Stop Time 7017    PT Time Calculation (min) 36 min    Activity Tolerance Patient tolerated treatment well    Behavior During Therapy WFL for tasks assessed/performed               Past Medical History:  Diagnosis Date   Barrett's esophagus    Chronic back pain    Depression    Gastritis    Mild   Gastroparesis    GERD (gastroesophageal reflux disease)    Headache    History of kidney stones    Hyperglycemia    Hypertension    Nephrolithiasis    Pneumonia    Covid pneumonia   Renal cyst    Sleep apnea    Stricture and stenosis of esophagus    Stroke ([place]) 07/2015   Vision abnormalities    Past Surgical History:  Procedure Laterality Date   CERVICAL [place] SURGERY  2012   COLONOSCOPY     LUMBAR [place] SURGERY  2005, 2010   replaced L4 and L5   SHOULDER ARTHROSCOPY WITH ROTATOR CUFF REPAIR Left 08/14/2020   Procedure: SHOULDER ARTHROSCOPY WITH ROTATOR CUFF REPAIR, SUBACROMIAL DECOMPRESSION;  Surgeon: Tania Ade, MD;  Location: [facility];  Service: Orthopedics;  Laterality: Left;   UPPER GASTROINTESTINAL ENDOSCOPY     Patient Active Problem List   Diagnosis Date Noted   Drug-induced constipation 06/08/2022   Hemiparesis affecting left side as late effect of cerebrovascular accident ([place]) 12/09/2021   Depression, major, recurrent, mild ([place])    Sleep disturbance    Urinary retention    Bradycardia    Essential hypertension    Temperature elevated    Left thyroid nodule    Treatment-emergent central sleep apnea 10/25/2018   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) 10/25/2018    Chronic pain syndrome    Chronic bilateral low back pain    Benign essential HTN    Tobacco abuse    Hyperlipidemia    History of CVA with residual deficit    Dysphagia, post-stroke    ICH (intracerebral hemorrhage) ([place]) - R thalmic/PLIC d/t HTN 79/39/0300   Insomnia 07/13/2017   PAD (peripheral artery disease) ([place]) 07/13/2017   Hyperlipidemia with target LDL less than 70 07/13/2017   Stroke-like symptom 09/22/2016   Paresthesia 09/22/2016   CVA (cerebral vascular accident) ([place]) 09/22/2016   Cerebrovascular accident (CVA) due to thrombosis of left carotid artery ([place]) 09/23/2015   Primary snoring 09/23/2015   Hypersomnia with sleep apnea 09/23/2015   Obstructive sleep apnea 09/23/2015   Lacunar infarct, acute ([place]) 08/25/2015   Sleep apnea 08/25/2015   Dysarthria    CVD (cardiovascular disease) 08/02/2015   Acute hyperglycemia 92/33/0076   Systolic hypertension with cerebrovascular disease 12/27/2014   Epistaxis 07/28/2012   NEPHROLITHIASIS 05/23/2009   BARRETTS ESOPHAGUS 02/20/2009   Gastroparesis 02/20/2009   RADICULOPATHY 10/21/2008   GERD 10/08/2008   ESOPHAGITIS 08/15/2008   ESOPHAGEAL STRICTURE 08/15/2008   GASTRITIS, ACUTE 08/15/2008   RENAL CYST 08/14/2008   TOBACCO ABUSE 08/08/2008   HEMATURIA, MICROSCOPIC, HX OF  08/08/2008   PULMONARY NODULE 01/10/2008    PCP: Dr Betty Martinique   REFERRING PROVIDER: Dr Betty Martinique   REFERRING DIAG:  Diagnosis  R29.6 (ICD-10-CM) - Frequent falls    THERAPY DIAG:  Repeated falls  Muscle weakness (generalized)  Other low back pain  Rationale for Evaluation and Treatment: Rehabilitation  ONSET DATE: increasing number of falls over the past few weeks  SUBJECTIVE:   SUBJECTIVE STATEMENT:  Pt states he is doing well today. Pt has usual back pain. He was mildly sore into his hips after last session.   Eval: The patient has a long history of balance disturbances following a stroke in 2016. He reports over the past  several months the balance deficits have increased. He has had 4 major falls. 3 of which were falling backwards. The other fall was from tripping over an object. He is using a cane right now 2nd to decreased ability to use his left arm.   PERTINENT HISTORY: Depression, chronic low back pain, Lumbar surgery 2005 , 2010 ; RTC repair 2021; CVA, HTN, PNA, Stroke 2016. Stroke effected Left UE,   PAIN:  Are you having pain? Yes: NPRS scale: 5/10 Pain location: middle of his lower back  Pain description: aching  Aggravating factors: bending  Relieving factors: rest   PRECAUTIONS: Fall  WEIGHT BEARING RESTRICTIONS: No  FALLS:  Has patient fallen in last 6 months? Yes. 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 a week  PLOF: Independent with use of a cane   PATIENT GOALS:  To improve balance   OBJECTIVE:    TODAY'S TREATMENT:                                                                                                                              DATE: 1/16  Gait belt throughout session  Nu step lvl 9  Sidestep at sivler bar RTB at knees 3x laps   Standing rotation (simulation of fishing/casting motion) L and R 20x each way on Airex Standing fwd reach with 5lb 2x10 (head hips relationship) Sidestepping and retro 3x laps each with RTB at knees NBOS EC on airex 30s 3x STS training with focus on getting COG over BOS, mechanics, set up, and sequencing for most effiency and safety     PATIENT EDUCATION:  Education details: HEP, balance systems, compensatory measures Person educated: Patient Education method: Explanation, Demonstration, Tactile cues, Verbal cues, and Handouts Education comprehension: verbalized understanding, returned demonstration, verbal cues required, tactile cues required, and needs further education  HOME EXERCISE PROGRAM:   ASSESSMENT:  CLINICAL  IMPRESSION: Pt balance exercise progress dynamic balance related activity at today's session.  Patient was able to begin sidestepping and retrostepping outside of a support with upper extremity assistance and contact-guard.  Patient does require consistent verbal and tactile cueing for reciprocal stepping  versus step to with both exercises.  Patient continues to demonstrate posterior center gravity with transfers advised to work on getting more knee and ankle dorsiflexion in addition to positive trunk lean to reduce posterior displacement.  Plan to continue with dynamic balance and transfers as tolerated in future sessions.  Patient appeared to have less apprehension today with balance related activity.  Extended left lower extremity stance time still causes uneasiness during dynamic motions.  Patient would benefit from skilled therapy to reduce fall risk, improve ability to return to the gym, and decreased lower back pain with functional activity.  OBJECTIVE IMPAIRMENTS: Abnormal gait, decreased activity tolerance, decreased knowledge of use of DME, decreased mobility, difficulty walking, decreased ROM, decreased strength, decreased safety awareness, increased fascial restrictions, increased muscle spasms, impaired flexibility, postural dysfunction, and pain.   ACTIVITY LIMITATIONS: carrying, lifting, bending, standing, squatting, stairs, transfers, bed mobility, reach over head, and locomotion level  PARTICIPATION LIMITATIONS: meal prep, cleaning, laundry, driving, shopping, community activity, and yard work  PERSONAL FACTORS: 3+ comorbidities: Low back Surgery, frequent falls; Past CVA   are also affecting patient's functional outcome.   REHAB POTENTIAL: Good  CLINICAL DECISION MAKING: Evolving/moderate complexity increasing falls  EVALUATION COMPLEXITY: Moderate   GOALS: Goals reviewed with patient? Yes  SHORT TERM GOALS: Target date: 12/10/2022   Patient will decrease 5x sit to stand time  by 10 seconds and before all 5 reps.  Baseline: Goal status: INITIAL  2.  Patient will increase left quad and glut strength by 5 lbs  Baseline:  Goal status: INITIAL  3.  Patient will transfer sit to stand with SBA and decreased use of his hands  Baseline:  Goal status: INITIAL   LONG TERM GOALS: Target date: 01/07/2023    Patient will have no major falls over a month  Baseline:  Goal status: INITIAL  2.  Patients will score > 42 on the BERG to show a decreased fall risk  Baseline:  Goal status: INITIAL  3.  Patient will return to a full gym program  Baseline:  Goal status: INITIAL   PLAN:  PT FREQUENCY: 2x/week  PT DURATION: 8 weeks  PLANNED INTERVENTIONS: Therapeutic exercises, Therapeutic activity, Neuromuscular re-education, Balance training, Gait training, Patient/Family education, Self Care, Joint mobilization, Stair training, DME instructions, Aquatic Therapy, Spinal mobilization, Cryotherapy, Moist heat, Ultrasound, and Manual therapy  PLAN FOR NEXT SESSION:  In water work on posterior balance and lifting legs high. Work on the same things on land.  Review exercises machines at home.    Daleen Bo, PT 11/30/2022, 4:53 PM

## 2022-12-01 ENCOUNTER — Ambulatory Visit (HOSPITAL_COMMUNITY)
Admission: RE | Admit: 2022-12-01 | Discharge: 2022-12-01 | Disposition: A | Discharge status: Home / Self Care | Payer: Medicare Other | Source: Ambulatory Visit | Attending: Family Medicine | Admitting: Family Medicine

## 2022-12-01 DIAGNOSIS — K219 Gastro-esophageal reflux disease without esophagitis: Secondary | ICD-10-CM | POA: Diagnosis not present

## 2022-12-01 DIAGNOSIS — R1310 Dysphagia, unspecified: Secondary | ICD-10-CM | POA: Diagnosis not present

## 2022-12-01 DIAGNOSIS — R059 Cough, unspecified: Secondary | ICD-10-CM

## 2022-12-01 DIAGNOSIS — R1312 Dysphagia, oropharyngeal phase: Secondary | ICD-10-CM | POA: Diagnosis not present

## 2022-12-01 DIAGNOSIS — K2270 Barrett's esophagus without dysplasia: Secondary | ICD-10-CM | POA: Diagnosis not present

## 2022-12-01 DIAGNOSIS — Z8673 Personal history of transient ischemic attack (TIA), and cerebral infarction without residual deficits: Secondary | ICD-10-CM

## 2022-12-01 NOTE — Progress Notes (Incomplete)
Objective Swallowing Evaluation: Type of Study: MBS-Modified Barium Swallow Study   Patient Details  Name: ROBSON TRICKEY MRN: [id number] Date of Birth: 1951/04/11  Today's Date: 12/01/2022 Time: SLP Start Time (ACUTE ONLY): 57 -SLP Stop Time (ACUTE ONLY): 6160  SLP Time Calculation (min) (ACUTE ONLY): 62 min   Past Medical History:  Past Medical History:  Diagnosis Date   Barrett's esophagus    Chronic back pain    Depression    Gastritis    Mild   Gastroparesis    GERD (gastroesophageal reflux disease)    Headache    History of kidney stones    Hyperglycemia    Hypertension    Nephrolithiasis    Pneumonia    Covid pneumonia   Renal cyst    Sleep apnea    Stricture and stenosis of esophagus    Stroke ([place]) 07/2015   Vision abnormalities    Past Surgical History:  Past Surgical History:  Procedure Laterality Date   CERVICAL [place] SURGERY  2012   COLONOSCOPY     LUMBAR [place] SURGERY  2005, 2010   replaced L4 and L5   SHOULDER ARTHROSCOPY WITH ROTATOR CUFF REPAIR Left 08/14/2020   Procedure: SHOULDER ARTHROSCOPY WITH ROTATOR CUFF REPAIR, SUBACROMIAL DECOMPRESSION;  Surgeon: Tania Ade, MD;  Location: [facility];  Service: Orthopedics;  Laterality: Left;   UPPER GASTROINTESTINAL ENDOSCOPY     HPI: 72 yo male referred for OP MBS by GI MD.  Pt has PMH + for chronic bilateral thalamic CVAs, left corona radiata CVA, chronic SVD, Barret's esophagus, prior esophagram 10/2022 showed aspiration of thin/nectar and SLP evaluation was advised.  Pt presents with his wife and reports issues with swallow foods more than liquids.  Denies requiring heimlich manuever, having recurrent pnas (last in 2021/8 when he had COVID) but admits to some progressive weight loss.  He reports sensing food sticking in his throat (pointing lower) and cougning on food and drink.  Does not indicate any specific circumstances when occurs.  Admits that this has been ongoing since his last CVA without  improvement.  Pt admits to occasional nasal regurgittion of liquids.  Pt noted to be dysphonic and drooling and wife reports this has worsened.  Pt and his wife eat together.  Pt does take a reflux medication and report he has reflux symtpoms, burning if he does not take it.   Subjective: pt awake in chair, wife present    Recommendations for follow up therapy are one component of a multi-disciplinary discharge planning process, led by the attending physician.  Recommendations may be updated based on patient status, additional functional criteria and insurance authorization.  Assessment / Plan / Recommendation     12/01/2022    3:00 PM  Clinical Impressions  Clinical Impression Patient presents with mild oropharyngeal dysphagia with sensorimotor deficits.  Impaired oral control due to weakness most notably with solids requiring extra time for him to swallow to clear retention - and appeared effortful for him.  With tablet with pudding, pt separated tablet from pudding - after which tablet spilled into pharynx - pooling at vallecular space before it was swallowed. Pt sensed retention of tablet - but isolated symptom to lower in pharynx *at pyriform sinus?  Pharyngeal swallow is strong but inadequate laryngeal closure results in silent aspiration of that was not prevented with chin tuck posture or even tsp amounts.   Pt did have a cough reflex x1 after several boluses of thin aspiration - suspect barium reached the carina.  But pt denies this being consistent with his "choking" symptoms that prompted GI referral.  No aspiration/deep penetration of nectar, pudding, cracker and tablet.  Patient has not had pneumonias *since 06/2020 related to COVID* and thus is tolerating chronic aspiration of thin liquids. Wife reports she purchased thickener for home *she is a Marine scientist* and tried it with him but he did not like it.  Hydration would then likely be a significant concern given his displeasure.  Given pt is  tolerating aspiration - recommend to continue thin liquids *water preferred with meal* assuring pt tolerating.  Chronic throat clearing before po intake - without pt awareness.   Pt reported sensation of pharyngeal residual pointing to lower pharynx - when pharynx was fully clear - ? referent to esophagus with h/o of Barrett's. If pt becomes acutely ill, he may benefit from short term use of nectar thick drinks to maximize airway protection. Reviewed flouro loops in detail with pt and his wife and addressed dysphagia, increased causticity of solid/puree aspiration overt thin liquids.  Advised pt maintain strength of cough/hock and voice for airway protection.    All education completed and written instructions provided. Recommend follow up with SLP to address dysphagia - especially in light of wife's report of pt having worsening dysphonia and drooling.  Thanks for this referral.  SLP Visit Diagnosis Dysphagia, oropharyngeal phase (R13.12)  Impact on safety and function Moderate aspiration risk;Mild aspiration risk         02/23/2020    1:00 PM  Treatment Recommendations  Treatment Recommendations Therapy as outlined in treatment plan below        04/15/2018    1:29 PM  Prognosis  Prognosis for Safe Diet Advancement Good       12/01/2022    3:00 PM  Diet Recommendations  SLP Diet Recommendations Regular solids;Nectar thick liquid;Thin liquid  Liquid Administration via Cup;Straw  Medication Administration Whole meds with puree  Compensations Slow rate;Small sips/bites  Postural Changes Remain semi-upright after after feeds/meals (Comment);Seated upright at 90 degrees         12/01/2022    3:00 PM  Other Recommendations  Recommended Consults OP therapy for feeding       02/25/2020   12:00 PM  Frequency and Duration   Speech Therapy Frequency (ACUTE ONLY) min 2x/week         12/01/2022    3:00 PM  Oral Phase  Oral Phase Impaired  Oral - Nectar Teaspoon WFL  Oral - Nectar Cup  Premature spillage;Piecemeal swallowing  Oral - Thin Teaspoon Piecemeal swallowing;Premature spillage  Oral - Thin Cup Premature spillage;Piecemeal swallowing  Oral - Thin Straw Piecemeal swallowing;Decreased bolus cohesion  Oral - Puree Reduced posterior propulsion;Premature spillage;Piecemeal swallowing  Oral - Mech Soft Weak lingual manipulation;Decreased bolus cohesion;Delayed oral transit  Oral - Pill Delayed oral transit;Piecemeal swallowing;Weak lingual manipulation;Reduced posterior propulsion;Decreased bolus cohesion;Lingual/palatal residue  Oral Phase - Comment pt piecemeals across all consistency which is adequate/functional for his age, however he has more retention with solids and piecemealing/oral clearance is delayed and effortful for him, he uses liquids to aid oral clearance       12/01/2022    3:00 PM  Pharyngeal Phase  Pharyngeal Phase Impaired  Pharyngeal- Nectar Teaspoon WFL  Pharyngeal Material does not enter airway  Pharyngeal- Nectar Cup Penetration/Aspiration during swallow  Pharyngeal Material does not enter airway  Pharyngeal- Thin Teaspoon Penetration/Aspiration during swallow  Pharyngeal Material enters airway, passes BELOW cords without attempt by patient to eject out (silent  aspiration)  Pharyngeal- Thin Cup Reduced airway/laryngeal closure;Penetration/Aspiration during swallow  Pharyngeal Material enters airway, passes BELOW cords without attempt by patient to eject out (silent aspiration)  Pharyngeal- Thin Straw Reduced airway/laryngeal closure;Penetration/Aspiration during swallow  Pharyngeal Material enters airway, passes BELOW cords without attempt by patient to eject out (silent aspiration)  Pharyngeal- Puree WFL  Pharyngeal Material does not enter airway  Pharyngeal- Mechanical Soft WFL  Pharyngeal Material does not enter airway  Pharyngeal- Pill Delayed swallow initiation-vallecula  Pharyngeal Material does not enter airway  Pharyngeal Comment pill  given with pudding spilled from oral cavity into vallecular space - pooling at vallecula before swallowed and cleared        12/01/2022    3:00 PM  Cervical Esophageal Phase   Cervical Esophageal Phase Impaired  Cervical Esophageal Comment Upon esophageal sweep, barium tablet appears in mid-esophageal region, further boluses of nectar appeared to faciltate clearance of tablet into stomach     Macario Golds 12/01/2022, 4:16 PM   Kathleen Lime, MS [facility] SLP Acute Rehab Services Office [phone] Pager [phone]

## 2022-12-02 ENCOUNTER — Ambulatory Visit (HOSPITAL_BASED_OUTPATIENT_CLINIC_OR_DEPARTMENT_OTHER): Payer: Medicare Other | Admitting: Physical Therapy

## 2022-12-02 ENCOUNTER — Encounter (HOSPITAL_BASED_OUTPATIENT_CLINIC_OR_DEPARTMENT_OTHER): Payer: Self-pay | Admitting: Physical Therapy

## 2022-12-02 DIAGNOSIS — R296 Repeated falls: Secondary | ICD-10-CM | POA: Diagnosis not present

## 2022-12-02 DIAGNOSIS — M5459 Other low back pain: Secondary | ICD-10-CM

## 2022-12-02 DIAGNOSIS — M6281 Muscle weakness (generalized): Secondary | ICD-10-CM

## 2022-12-02 NOTE — Therapy (Signed)
OUTPATIENT PHYSICAL THERAPY LOWER EXTREMITY TREATMENT   Patient Name: Richard Vaughn MRN: [id number] DOB:[birth year], 72 y.o., male Today's Date: 12/02/2022  END OF SESSION:  PT End of Session - 12/02/22 1529     Visit Number 5    Number of Visits 16    Date for PT Re-Evaluation 01/07/23    Authorization Type MCR    PT Start Time 3532    PT Stop Time 1612    PT Time Calculation (min) 42 min    Behavior During Therapy WFL for tasks assessed/performed               Past Medical History:  Diagnosis Date   Barrett's esophagus    Chronic back pain    Depression    Gastritis    Mild   Gastroparesis    GERD (gastroesophageal reflux disease)    Headache    History of kidney stones    Hyperglycemia    Hypertension    Nephrolithiasis    Pneumonia    Covid pneumonia   Renal cyst    Sleep apnea    Stricture and stenosis of esophagus    Stroke ([place]) 07/2015   Vision abnormalities    Past Surgical History:  Procedure Laterality Date   CERVICAL [place] SURGERY  2012   COLONOSCOPY     LUMBAR [place] SURGERY  2005, 2010   replaced L4 and L5   SHOULDER ARTHROSCOPY WITH ROTATOR CUFF REPAIR Left 08/14/2020   Procedure: SHOULDER ARTHROSCOPY WITH ROTATOR CUFF REPAIR, SUBACROMIAL DECOMPRESSION;  Surgeon: Tania Ade, MD;  Location: [facility];  Service: Orthopedics;  Laterality: Left;   UPPER GASTROINTESTINAL ENDOSCOPY     Patient Active Problem List   Diagnosis Date Noted   Drug-induced constipation 06/08/2022   Hemiparesis affecting left side as late effect of cerebrovascular accident ([place]) 12/09/2021   Depression, major, recurrent, mild ([place])    Sleep disturbance    Urinary retention    Bradycardia    Essential hypertension    Temperature elevated    Left thyroid nodule    Treatment-emergent central sleep apnea 10/25/2018   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) 10/25/2018   Chronic pain syndrome    Chronic bilateral low back pain    Benign  essential HTN    Tobacco abuse    Hyperlipidemia    History of CVA with residual deficit    Dysphagia, post-stroke    ICH (intracerebral hemorrhage) ([place]) - R thalmic/PLIC d/t HTN 99/24/2683   Insomnia 07/13/2017   PAD (peripheral artery disease) ([place]) 07/13/2017   Hyperlipidemia with target LDL less than 70 07/13/2017   Stroke-like symptom 09/22/2016   Paresthesia 09/22/2016   CVA (cerebral vascular accident) ([place]) 09/22/2016   Cerebrovascular accident (CVA) due to thrombosis of left carotid artery ([place]) 09/23/2015   Primary snoring 09/23/2015   Hypersomnia with sleep apnea 09/23/2015   Obstructive sleep apnea 09/23/2015   Lacunar infarct, acute ([place]) 08/25/2015   Sleep apnea 08/25/2015   Dysarthria    CVD (cardiovascular disease) 08/02/2015   Acute hyperglycemia 41/96/2229   Systolic hypertension with cerebrovascular disease 12/27/2014   Epistaxis 07/28/2012   NEPHROLITHIASIS 05/23/2009   BARRETTS ESOPHAGUS 02/20/2009   Gastroparesis 02/20/2009   RADICULOPATHY 10/21/2008   GERD 10/08/2008   ESOPHAGITIS 08/15/2008   ESOPHAGEAL STRICTURE 08/15/2008   GASTRITIS, ACUTE 08/15/2008   RENAL CYST 08/14/2008   TOBACCO ABUSE 08/08/2008   HEMATURIA, MICROSCOPIC, HX OF 08/08/2008   PULMONARY NODULE 01/10/2008    PCP: Dr Inez Catalina  [place]   REFERRING PROVIDER: Dr Betty [place]   REFERRING DIAG:  Diagnosis  R29.6 (ICD-10-CM) - Frequent falls    THERAPY DIAG:  Repeated falls  Muscle weakness (generalized)  Other low back pain  Rationale for Evaluation and Treatment: Rehabilitation  ONSET DATE: increasing number of falls over the past few weeks  SUBJECTIVE:   SUBJECTIVE STATEMENT:  Pt states he is doing ok. Pt has usual back pain.  He is accompanied by his wife, Richard Vaughn (sitting on deck)   Eval: The patient has a long history of balance disturbances following a stroke in 2016. He reports over the past several months the balance deficits have increased. He has had 4 major  falls. 3 of which were falling backwards. The other fall was from tripping over an object. He is using a cane right now 2nd to decreased ability to use his left arm.   PERTINENT HISTORY: Depression, chronic low back pain, Lumbar surgery 2005 , 2010 ; RTC repair 2021; CVA, HTN, PNA, Stroke 2016. Stroke effected Left UE,   PAIN:  Are you having pain? Yes: NPRS scale: 5/10 Pain location: middle of his lower back  Pain description: aching  Aggravating factors: bending  Relieving factors: rest   PRECAUTIONS: Fall  WEIGHT BEARING RESTRICTIONS: No  FALLS:  Has patient fallen in last 6 months? Yes. Number of falls 4  LIVING ENVIRONMENT: 2 steps into the house  OCCUPATION: retired   Hobbies: was going to the gym 3x a week and walking 2x a week  PLOF: Independent with use of a cane   PATIENT GOALS:  To improve balance   OBJECTIVE:    TODAY'S TREATMENT:                                                                                                                              DATE: 12/02/22     Pt seen for aquatic therapy today.  Treatment took place in water 3.25-4.5 ft in depth at the [place]. Temp of water was 92.  Pt entered/exited the pool via stairs with step-to gait pattern with bilat rail and close supervision.  Therapist in water with patient  * with hands on white barbell:  forward and backward gait, marching forward backward, and side stepping * Holding white barbell with 2 hands:  Rt forward kick (stationary on LLE) x 10; 3 way toe touch x 8 each LE with intermittent CGA to steady * return to walking with barbell: forward/ backward gait; weight shifts onto LLE * hip hinge with forward arm reach ,cues for extended knees x 10 * low back stretch with hands on bilat rails  Pt requires the buoyancy and hydrostatic pressure of water for support, and to offload joints by unweighting joint load by at least 50 % in navel deep water and by at least 75-80% in chest  to neck deep water.  Viscosity of the water is needed for resistance of strengthening. Water  current perturbations provides challenge to standing balance requiring increased core activation.     PATIENT EDUCATION:  Education details: HEP, balance systems, compensatory measures Person educated: Patient Education method: Explanation, Demonstration, Tactile cues, Verbal cues, and Handouts Education comprehension: verbalized understanding, returned demonstration, verbal cues required, tactile cues required, and needs further education  HOME EXERCISE PROGRAM:   ASSESSMENT:  CLINICAL IMPRESSION: Pt getting reacquainted with exercises in the water.  He had multiple episodes of posterior LOB when in L stance progressing RLE to heel strike; able to self correct, but trunk became more stiff and rigid each time.  Spent time loading LLE with dynamic RLE exercises with intermittent CGA to pt to steady when holding onto white barbell (vs wall) . Plan to continue with dynamic balance and transfers as tolerated in future sessions.  Patient would benefit from skilled therapy to reduce fall risk, improve ability to return to the gym, and decreased lower back pain with functional activity.  OBJECTIVE IMPAIRMENTS: Abnormal gait, decreased activity tolerance, decreased knowledge of use of DME, decreased mobility, difficulty walking, decreased ROM, decreased strength, decreased safety awareness, increased fascial restrictions, increased muscle spasms, impaired flexibility, postural dysfunction, and pain.   ACTIVITY LIMITATIONS: carrying, lifting, bending, standing, squatting, stairs, transfers, bed mobility, reach over head, and locomotion level  PARTICIPATION LIMITATIONS: meal prep, cleaning, laundry, driving, shopping, community activity, and yard work  PERSONAL FACTORS: 3+ comorbidities: Low back Surgery, frequent falls; Past CVA   are also affecting patient's functional outcome.   REHAB POTENTIAL:  Good  CLINICAL DECISION MAKING: Evolving/moderate complexity increasing falls  EVALUATION COMPLEXITY: Moderate   GOALS: Goals reviewed with patient? Yes  SHORT TERM GOALS: Target date: 12/10/2022   Patient will decrease 5x sit to stand time by 10 seconds and before all 5 reps.  Baseline: Goal status: INITIAL  2.  Patient will increase left quad and glut strength by 5 lbs  Baseline:  Goal status: INITIAL  3.  Patient will transfer sit to stand with SBA and decreased use of his hands  Baseline:  Goal status: INITIAL   LONG TERM GOALS: Target date: 01/07/2023    Patient will have no major falls over a month  Baseline:  Goal status: INITIAL  2.  Patients will score > 42 on the BERG to show a decreased fall risk  Baseline:  Goal status: INITIAL  3.  Patient will return to a full gym program  Baseline:  Goal status: INITIAL   PLAN:  PT FREQUENCY: 2x/week  PT DURATION: 8 weeks  PLANNED INTERVENTIONS: Therapeutic exercises, Therapeutic activity, Neuromuscular re-education, Balance training, Gait training, Patient/Family education, Self Care, Joint mobilization, Stair training, DME instructions, Aquatic Therapy, Spinal mobilization, Cryotherapy, Moist heat, Ultrasound, and Manual therapy  PLAN FOR NEXT SESSION:  In water work on posterior balance and lifting legs high. Work on the same things on land.  Review exercises machines at home.   Kerin Perna, PTA 12/02/22 6:41 PM [facility] [address] [place], [place], [address] Phone: [phone]   Fax:  [phone]

## 2022-12-03 ENCOUNTER — Other Ambulatory Visit: Payer: Self-pay

## 2022-12-03 DIAGNOSIS — R1312 Dysphagia, oropharyngeal phase: Secondary | ICD-10-CM

## 2022-12-07 ENCOUNTER — Encounter (HOSPITAL_BASED_OUTPATIENT_CLINIC_OR_DEPARTMENT_OTHER): Payer: Self-pay | Admitting: Physical Therapy

## 2022-12-07 ENCOUNTER — Ambulatory Visit (HOSPITAL_BASED_OUTPATIENT_CLINIC_OR_DEPARTMENT_OTHER): Payer: Medicare Other | Admitting: Physical Therapy

## 2022-12-07 DIAGNOSIS — M6281 Muscle weakness (generalized): Secondary | ICD-10-CM

## 2022-12-07 DIAGNOSIS — M5459 Other low back pain: Secondary | ICD-10-CM

## 2022-12-07 DIAGNOSIS — R296 Repeated falls: Secondary | ICD-10-CM

## 2022-12-07 NOTE — Therapy (Signed)
OUTPATIENT PHYSICAL THERAPY LOWER EXTREMITY TREATMENT   Patient Name: Richard Vaughn MRN: [id number] DOB:[birth year], 72 y.o., male Today's Date: 12/07/2022  END OF SESSION:  PT End of Session - 12/07/22 1441     Visit Number 6    Number of Visits 16    Date for PT Re-Evaluation 01/07/23    Authorization Type MCR    PT Start Time 1434    PT Stop Time 1513    PT Time Calculation (min) 39 min    Behavior During Therapy WFL for tasks assessed/performed               Past Medical History:  Diagnosis Date   Barrett's esophagus    Chronic back pain    Depression    Gastritis    Mild   Gastroparesis    GERD (gastroesophageal reflux disease)    Headache    History of kidney stones    Hyperglycemia    Hypertension    Nephrolithiasis    Pneumonia    Covid pneumonia   Renal cyst    Sleep apnea    Stricture and stenosis of esophagus    Stroke ([place]) 07/2015   Vision abnormalities    Past Surgical History:  Procedure Laterality Date   CERVICAL [place] SURGERY  2012   COLONOSCOPY     LUMBAR [place] SURGERY  2005, 2010   replaced L4 and L5   SHOULDER ARTHROSCOPY WITH ROTATOR CUFF REPAIR Left 08/14/2020   Procedure: SHOULDER ARTHROSCOPY WITH ROTATOR CUFF REPAIR, SUBACROMIAL DECOMPRESSION;  Surgeon: Tania Ade, MD;  Location: [facility];  Service: Orthopedics;  Laterality: Left;   UPPER GASTROINTESTINAL ENDOSCOPY     Patient Active Problem List   Diagnosis Date Noted   Drug-induced constipation 06/08/2022   Hemiparesis affecting left side as late effect of cerebrovascular accident ([place]) 12/09/2021   Depression, major, recurrent, mild ([place])    Sleep disturbance    Urinary retention    Bradycardia    Essential hypertension    Temperature elevated    Left thyroid nodule    Treatment-emergent central sleep apnea 10/25/2018   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) 10/25/2018   Chronic pain syndrome    Chronic bilateral low back pain    Benign  essential HTN    Tobacco abuse    Hyperlipidemia    History of CVA with residual deficit    Dysphagia, post-stroke    ICH (intracerebral hemorrhage) ([place]) - R thalmic/PLIC d/t HTN 26/83/4196   Insomnia 07/13/2017   PAD (peripheral artery disease) ([place]) 07/13/2017   Hyperlipidemia with target LDL less than 70 07/13/2017   Stroke-like symptom 09/22/2016   Paresthesia 09/22/2016   CVA (cerebral vascular accident) ([place]) 09/22/2016   Cerebrovascular accident (CVA) due to thrombosis of left carotid artery ([place]) 09/23/2015   Primary snoring 09/23/2015   Hypersomnia with sleep apnea 09/23/2015   Obstructive sleep apnea 09/23/2015   Lacunar infarct, acute ([place]) 08/25/2015   Sleep apnea 08/25/2015   Dysarthria    CVD (cardiovascular disease) 08/02/2015   Acute hyperglycemia 22/29/7989   Systolic hypertension with cerebrovascular disease 12/27/2014   Epistaxis 07/28/2012   NEPHROLITHIASIS 05/23/2009   BARRETTS ESOPHAGUS 02/20/2009   Gastroparesis 02/20/2009   RADICULOPATHY 10/21/2008   GERD 10/08/2008   ESOPHAGITIS 08/15/2008   ESOPHAGEAL STRICTURE 08/15/2008   GASTRITIS, ACUTE 08/15/2008   RENAL CYST 08/14/2008   TOBACCO ABUSE 08/08/2008   HEMATURIA, MICROSCOPIC, HX OF 08/08/2008   PULMONARY NODULE 01/10/2008    PCP: Dr Inez Catalina  [place]   REFERRING PROVIDER: Dr Betty [place]   REFERRING DIAG:  Diagnosis  R29.6 (ICD-10-CM) - Frequent falls    THERAPY DIAG:  Repeated falls  Muscle weakness (generalized)  Other low back pain  Rationale for Evaluation and Treatment: Rehabilitation  ONSET DATE: increasing number of falls over the past few weeks  SUBJECTIVE:   SUBJECTIVE STATEMENT:  Pt states he had a good session in the water last time. He continues to have his chronic back pain. Did have a hard time loading his L leg in the pool.   Eval: The patient has a long history of balance disturbances following a stroke in 2016. He reports over the past several months the balance  deficits have increased. He has had 4 major falls. 3 of which were falling backwards. The other fall was from tripping over an object. He is using a cane right now 2nd to decreased ability to use his left arm.   PERTINENT HISTORY: Depression, chronic low back pain, Lumbar surgery 2005 , 2010 ; RTC repair 2021; CVA, HTN, PNA, Stroke 2016. Stroke effected Left UE,   PAIN:  Are you having pain? Yes: NPRS scale: 5/10 Pain location: middle of his lower back  Pain description: aching  Aggravating factors: bending  Relieving factors: rest   PRECAUTIONS: Fall  WEIGHT BEARING RESTRICTIONS: No  FALLS:  Has patient fallen in last 6 months? Yes. 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 a week  PLOF: Independent with use of a cane   PATIENT GOALS:  To improve balance   OBJECTIVE:    TODAY'S TREATMENT:                                                                                                                              DATE:   1/23  Nu step Lvl 9 6 min  L LE only shuttle leg press 3x10 50lbs   42f x4 laps Med and lateral gait perturbations; 312f4x laps with AP perturbations (working on reactive stepping, increasing BOS width/stance) Fwd high step 3567fSTS training with focus on getting COG over BOS, mechanics, set up, and sequencing for most effiency and safety     12/02/22     Pt seen for aquatic therapy today.  Treatment took place in water 3.25-4.5 ft [facility]  Pt entered/exited the pool via stairs with step-to gait pattern with bilat rail and close supervision.  Therapist in water with patient  * with hands on white barbell:  forward and backward gait, marching forward backward, and side stepping * Holding white barbell with 2 hands:  Rt forward kick (stationary on LLE) x 10; 3 way toe touch x 8 each LE with  intermittent CGA to steady * return to walking with barbell: forward/  backward gait; weight shifts onto LLE * hip hinge with forward arm reach ,cues for extended knees x 10 * low back stretch with hands on bilat rails  Pt requires the buoyancy and hydrostatic pressure of water for support, and to offload joints by unweighting joint load by at least 50 % in navel deep water and by at least 75-80% in chest to neck deep water.  Viscosity of the water is needed for resistance of strengthening. Water current perturbations provides challenge to standing balance requiring increased core activation.     PATIENT EDUCATION:  Education details: HEP, balance systems, compensatory measures Person educated: Patient Education method: Explanation, Demonstration, Tactile cues, Verbal cues, and Handouts Education comprehension: verbalized understanding, returned demonstration, verbal cues required, tactile cues required, and needs further education  HOME EXERCISE PROGRAM:   ASSESSMENT:  CLINICAL IMPRESSION: Session on land focused on L LE strength, stepping/righting reactions, as well as foot clearance. Pt did demonstrate good stepping reaction with lateral step for frontal plane LOB, fwd or retro step for sagittal plane loss, and diagonal steps for multi-directional LOB. Pt did demo better transfer mechanics with less VC and TC. Plan to continue with L LE strength and balance in aquatic and land setting.  Patient would benefit from skilled therapy to reduce fall risk, improve ability to return to the gym, and decreased lower back pain with functional activity.  OBJECTIVE IMPAIRMENTS: Abnormal gait, decreased activity tolerance, decreased knowledge of use of DME, decreased mobility, difficulty walking, decreased ROM, decreased strength, decreased safety awareness, increased fascial restrictions, increased muscle spasms, impaired flexibility, postural dysfunction, and pain.   ACTIVITY LIMITATIONS: carrying,  lifting, bending, standing, squatting, stairs, transfers, bed mobility, reach over head, and locomotion level  PARTICIPATION LIMITATIONS: meal prep, cleaning, laundry, driving, shopping, community activity, and yard work  PERSONAL FACTORS: 3+ comorbidities: Low back Surgery, frequent falls; Past CVA   are also affecting patient's functional outcome.   REHAB POTENTIAL: Good  CLINICAL DECISION MAKING: Evolving/moderate complexity increasing falls  EVALUATION COMPLEXITY: Moderate   GOALS: Goals reviewed with patient? Yes  SHORT TERM GOALS: Target date: 12/10/2022   Patient will decrease 5x sit to stand time by 10 seconds and before all 5 reps.  Baseline: Goal status: INITIAL  2.  Patient will increase left quad and glut strength by 5 lbs  Baseline:  Goal status: INITIAL  3.  Patient will transfer sit to stand with SBA and decreased use of his hands  Baseline:  Goal status: INITIAL   LONG TERM GOALS: Target date: 01/07/2023    Patient will have no major falls over a month  Baseline:  Goal status: INITIAL  2.  Patients will score > 42 on the BERG to show a decreased fall risk  Baseline:  Goal status: INITIAL  3.  Patient will return to a full gym program  Baseline:  Goal status: INITIAL   PLAN:  PT FREQUENCY: 2x/week  PT DURATION: 8 weeks  PLANNED INTERVENTIONS: Therapeutic exercises, Therapeutic activity, Neuromuscular re-education, Balance training, Gait training, Patient/Family education, Self Care, Joint mobilization, Stair training, DME instructions, Aquatic Therapy, Spinal mobilization, Cryotherapy, Moist heat, Ultrasound, and Manual therapy  PLAN FOR NEXT SESSION:  In water work on posterior balance and lifting legs high. Work on the same things on land.  Review exercises machines at home.   Daleen Bo PT, DPT 12/07/22 3:23 PM

## 2022-12-08 ENCOUNTER — Other Ambulatory Visit: Payer: Self-pay | Admitting: Family Medicine

## 2022-12-08 ENCOUNTER — Encounter: Payer: Self-pay | Admitting: Gastroenterology

## 2022-12-08 DIAGNOSIS — R1312 Dysphagia, oropharyngeal phase: Secondary | ICD-10-CM

## 2022-12-08 DIAGNOSIS — Z8673 Personal history of transient ischemic attack (TIA), and cerebral infarction without residual deficits: Secondary | ICD-10-CM

## 2022-12-08 DIAGNOSIS — G8929 Other chronic pain: Secondary | ICD-10-CM

## 2022-12-08 DIAGNOSIS — G894 Chronic pain syndrome: Secondary | ICD-10-CM

## 2022-12-08 MED ORDER — OXYCODONE HCL 10 MG PO TABS: 10.0000 mg | ORAL_TABLET | Freq: Two times a day (BID) | ORAL | 0 refills | Status: DC

## 2022-12-08 NOTE — Telephone Encounter (Signed)
Prescription Request  12/08/2022  Is this a "Controlled Substance" medicine? Yes  LOV: 11/03/2022  What is the name of the medication or equipment? Oxycodone HCl 10 MG TABS   Have you contacted your pharmacy to request a refill? Yes   Which pharmacy would you like this sent to?    [id number]- [place], [place] - [address]Phone: [phone]: [phone]   Patient notified that their request is being sent to the clinical staff for review and that they should receive a response within 2 business days.   Please advise at Mobile [phone](mobile)

## 2022-12-09 ENCOUNTER — Ambulatory Visit (HOSPITAL_BASED_OUTPATIENT_CLINIC_OR_DEPARTMENT_OTHER): Payer: Medicare Other | Admitting: Physical Therapy

## 2022-12-09 ENCOUNTER — Encounter (HOSPITAL_BASED_OUTPATIENT_CLINIC_OR_DEPARTMENT_OTHER): Payer: Self-pay | Admitting: Physical Therapy

## 2022-12-09 DIAGNOSIS — M5459 Other low back pain: Secondary | ICD-10-CM | POA: Diagnosis not present

## 2022-12-09 DIAGNOSIS — R296 Repeated falls: Secondary | ICD-10-CM | POA: Diagnosis not present

## 2022-12-09 DIAGNOSIS — M6281 Muscle weakness (generalized): Secondary | ICD-10-CM

## 2022-12-09 NOTE — Therapy (Signed)
OUTPATIENT PHYSICAL THERAPY LOWER EXTREMITY TREATMENT   Patient Name: Richard Vaughn MRN: [id number] DOB:May 09, 1951, 72 y.o., male Today's Date: 12/09/2022  END OF SESSION:  PT End of Session - 12/09/22 1549     Visit Number 7    Number of Visits 16    Date for PT Re-Evaluation 01/07/23    Authorization Type MCR    PT Start Time 1538    PT Stop Time 1616    PT Time Calculation (min) 38 min    Behavior During Therapy WFL for tasks assessed/performed               Past Medical History:  Diagnosis Date   Barrett's esophagus    Chronic back pain    Depression    Gastritis    Mild   Gastroparesis    GERD (gastroesophageal reflux disease)    Headache    History of kidney stones    Hyperglycemia    Hypertension    Nephrolithiasis    Pneumonia    Covid pneumonia   Renal cyst    Sleep apnea    Stricture and stenosis of esophagus    Stroke ([place]) 07/2015   Vision abnormalities    Past Surgical History:  Procedure Laterality Date   CERVICAL [place] SURGERY  2012   COLONOSCOPY     LUMBAR [place] SURGERY  2005, 2010   replaced L4 and L5   SHOULDER ARTHROSCOPY WITH ROTATOR CUFF REPAIR Left 08/14/2020   Procedure: SHOULDER ARTHROSCOPY WITH ROTATOR CUFF REPAIR, SUBACROMIAL DECOMPRESSION;  Surgeon: Tania Ade, MD;  Location: [facility];  Service: Orthopedics;  Laterality: Left;   UPPER GASTROINTESTINAL ENDOSCOPY     Patient Active Problem List   Diagnosis Date Noted   Drug-induced constipation 06/08/2022   Hemiparesis affecting left side as late effect of cerebrovascular accident ([place]) 12/09/2021   Depression, major, recurrent, mild ([place])    Sleep disturbance    Urinary retention    Bradycardia    Essential hypertension    Temperature elevated    Left thyroid nodule    Treatment-emergent central sleep apnea 10/25/2018   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) 10/25/2018   Chronic pain syndrome    Chronic bilateral low back pain    Benign  essential HTN    Tobacco abuse    Hyperlipidemia    History of CVA with residual deficit    Dysphagia, post-stroke    ICH (intracerebral hemorrhage) ([place]) - R thalmic/PLIC d/t HTN 25/63/8937   Insomnia 07/13/2017   PAD (peripheral artery disease) ([place]) 07/13/2017   Hyperlipidemia with target LDL less than 70 07/13/2017   Stroke-like symptom 09/22/2016   Paresthesia 09/22/2016   CVA (cerebral vascular accident) ([place]) 09/22/2016   Cerebrovascular accident (CVA) due to thrombosis of left carotid artery ([place]) 09/23/2015   Primary snoring 09/23/2015   Hypersomnia with sleep apnea 09/23/2015   Obstructive sleep apnea 09/23/2015   Lacunar infarct, acute ([place]) 08/25/2015   Sleep apnea 08/25/2015   Dysarthria    CVD (cardiovascular disease) 08/02/2015   Acute hyperglycemia 34/28/7681   Systolic hypertension with cerebrovascular disease 12/27/2014   Epistaxis 07/28/2012   NEPHROLITHIASIS 05/23/2009   BARRETTS ESOPHAGUS 02/20/2009   Gastroparesis 02/20/2009   RADICULOPATHY 10/21/2008   GERD 10/08/2008   ESOPHAGITIS 08/15/2008   ESOPHAGEAL STRICTURE 08/15/2008   GASTRITIS, ACUTE 08/15/2008   RENAL CYST 08/14/2008   TOBACCO ABUSE 08/08/2008   HEMATURIA, MICROSCOPIC, HX OF 08/08/2008   PULMONARY NODULE 01/10/2008    PCP: Dr Inez Catalina  [place]   REFERRING PROVIDER: Dr Betty [place]   REFERRING DIAG:  Diagnosis  R29.6 (ICD-10-CM) - Frequent falls    THERAPY DIAG:  Repeated falls  Muscle weakness (generalized)  Rationale for Evaluation and Treatment: Rehabilitation  ONSET DATE: increasing number of falls over the past few weeks  SUBJECTIVE:   SUBJECTIVE STATEMENT:  Pt's wife states, "he worked his legs real good yesterday".  Pt reports that his LEs feel weak today; was doing exercises with wts at home yesterday.   Eval: The patient has a long history of balance disturbances following a stroke in 2016. He reports over the past several months the balance deficits have increased.  He has had 4 major falls. 3 of which were falling backwards. The other fall was from tripping over an object. He is using a cane right now 2nd to decreased ability to use his left arm.   PERTINENT HISTORY: Depression, chronic low back pain, Lumbar surgery 2005 , 2010 ; RTC repair 2021; CVA, HTN, PNA, Stroke 2016. Stroke effected Left UE,   PAIN:  Are you having pain? Yes: NPRS scale: 5/10 Pain location: middle of his lower back  Pain description: aching  Aggravating factors: bending  Relieving factors: rest   PRECAUTIONS: Fall  WEIGHT BEARING RESTRICTIONS: No  FALLS:  Has patient fallen in last 6 months? Yes. Number of falls 4  LIVING ENVIRONMENT: 2 steps into the house  OCCUPATION: retired   Hobbies: was going to the gym 3x a week and walking 2x a week  PLOF: Independent with use of a cane   PATIENT GOALS:  To improve balance   OBJECTIVE:    TODAY'S TREATMENT:                                                                                                                              DATE:  12/09/22 Pt seen for aquatic therapy today.  Treatment took place in water 3.25-4.5 ft in depth at the [facility]. Temp of water was 91.  Pt entered/exited the pool via stairs with bilat rail with SBA.  Therapist in water with patient entire session.  * forward step leading with LLE and return to neutral with HHA  * side stepping R/L with HHA * Rt hand on wall:  bilat heel raisesx 10, R/L single heel raises x 5; hip abdct x 10 each * UE on black barbell and intermittent CGA to barbell:  forward / backward gait; forward marching(challenge/ LLE abdcts); forward step and return to neutral with RLE, then side step and return to neutral changing directions (diagonal, backward, forward, and switching feet)  * UE on black barbell and attempt at SLS x 2-3 sec LLE, 5-8 on RLE;  Rt forward kicks x 10 (to load LLE) * Holding rails:  low back stretch (modified childs pose to return to  standing) x 5 reps;  light toe taps to 1st step with RUE on rail;  squats with  RUE (light touch) on rail * return to walking forward with barbell -> without any UE support * backwards gait without support (2 LOB backwards with light CGA to correct) * hip (buttocks) bumps off pool wall with cues to thrust hips forward off of wall  Pt requires the buoyancy and hydrostatic pressure of water for support, and to offload joints by unweighting joint load by at least 50 % in navel deep water and by at least 75-80% in chest to neck deep water.  Viscosity of the water is needed for resistance of strengthening. Water current perturbations provides challenge to standing balance requiring increased core activation.  1/23  Nu step Lvl 9 6 min  L LE only shuttle leg press 3x10 50lbs   5f x4 laps Med and lateral gait perturbations; 328f4x laps with AP perturbations (working on reactive stepping, increasing BOS width/stance) Fwd high step 3535fSTS training with focus on getting COG over BOS, mechanics, set up, and sequencing for most effiency and safety   12/02/22     Pt seen for aquatic therapy today.  Treatment took place in water 3.25-4.5 ft in [facility].  Pt entered/exited the pool via stairs with step-to gait pattern with bilat rail and close supervision.  Therapist in water with patient  * with hands on white barbell:  forward and backward gait, marching forward (challenge/ LLE abdcts), and side stepping * Holding white barbell with 2 hands:  Rt forward kick (stationary on LLE) x 10; 3 way toe touch x 8 each LE with intermittent CGA to steady * return to walking with barbell: forward/ backward gait; weight shifts onto LLE * hip hinge with forward arm reach ,cues for extended knees x 10 * low back stretch with hands on bilat rails  Pt requires the buoyancy and hydrostatic pressure of water for support, and to offload joints by unweighting joint  load by at least 50 % in navel deep water and by at least 75-80% in chest to neck deep water.  Viscosity of the water is needed for resistance of strengthening. Water current perturbations provides challenge to standing balance requiring increased core activation.     PATIENT EDUCATION:  Education details: HEP, balance systems, compensatory measures Person educated: Patient Education method: Explanation, Demonstration, Tactile cues, Verbal cues, and Handouts Education comprehension: verbalized understanding, returned demonstration, verbal cues required, tactile cues required, and needs further education  HOME EXERCISE PROGRAM:   ASSESSMENT:  CLINICAL IMPRESSION: Session in water focused on L LE strength, stepping/righting reactions, change in directions, as well as foot clearance. Pt with improved confidence and balance this aquatic visit, although he is still challenged with backward gait. He began session with HHA and was able to progress to intermittent CGA- SBA.  Plan to continue with L LE strength and balance in aquatic and land setting.  Patient would benefit from skilled therapy to reduce fall risk, improve ability to return to the gym, and decreased lower back pain with functional activity.  OBJECTIVE IMPAIRMENTS: Abnormal gait, decreased activity tolerance, decreased knowledge of use of DME, decreased mobility, difficulty walking, decreased ROM, decreased strength, decreased safety awareness, increased fascial restrictions, increased muscle spasms, impaired flexibility, postural dysfunction, and pain.   ACTIVITY LIMITATIONS: carrying, lifting, bending, standing, squatting, stairs, transfers, bed mobility, reach over head, and locomotion level  PARTICIPATION LIMITATIONS: meal prep, cleaning, laundry, driving, shopping, community activity, and yard work  PERSONAL FACTORS: 3+ comorbidities: Low back Surgery, frequent  falls; Past CVA   are also affecting patient's functional outcome.    REHAB POTENTIAL: Good  CLINICAL DECISION MAKING: Evolving/moderate complexity increasing falls  EVALUATION COMPLEXITY: Moderate   GOALS: Goals reviewed with patient? Yes  SHORT TERM GOALS: Target date: 12/10/2022   Patient will decrease 5x sit to stand time by 10 seconds and before all 5 reps.  Baseline: Goal status: INITIAL  2.  Patient will increase left quad and glut strength by 5 lbs  Baseline:  Goal status: INITIAL  3.  Patient will transfer sit to stand with SBA and decreased use of his hands  Baseline:  Goal status: INITIAL   LONG TERM GOALS: Target date: 01/07/2023    Patient will have no major falls over a month  Baseline:  Goal status: INITIAL  2.  Patients will score > 42 on the BERG to show a decreased fall risk  Baseline:  Goal status: INITIAL  3.  Patient will return to a full gym program  Baseline:  Goal status: INITIAL   PLAN:  PT FREQUENCY: 2x/week  PT DURATION: 8 weeks  PLANNED INTERVENTIONS: Therapeutic exercises, Therapeutic activity, Neuromuscular re-education, Balance training, Gait training, Patient/Family education, Self Care, Joint mobilization, Stair training, DME instructions, Aquatic Therapy, Spinal mobilization, Cryotherapy, Moist heat, Ultrasound, and Manual therapy  PLAN FOR NEXT SESSION:  In water work on posterior balance and lifting legs high. Work on the same things on land.  Review exercises machines at home.   Kerin Perna, PTA 12/09/22 4:43 PM [facility] [address] [place], [place], [address] Phone: [phone]   Fax:  [phone]

## 2022-12-14 ENCOUNTER — Ambulatory Visit (HOSPITAL_BASED_OUTPATIENT_CLINIC_OR_DEPARTMENT_OTHER): Payer: Medicare Other | Admitting: Physical Therapy

## 2022-12-14 ENCOUNTER — Encounter (HOSPITAL_BASED_OUTPATIENT_CLINIC_OR_DEPARTMENT_OTHER): Payer: Self-pay | Admitting: Physical Therapy

## 2022-12-14 ENCOUNTER — Telehealth: Payer: Self-pay | Admitting: Family Medicine

## 2022-12-14 DIAGNOSIS — M5459 Other low back pain: Secondary | ICD-10-CM

## 2022-12-14 DIAGNOSIS — R296 Repeated falls: Secondary | ICD-10-CM

## 2022-12-14 DIAGNOSIS — M6281 Muscle weakness (generalized): Secondary | ICD-10-CM

## 2022-12-14 NOTE — Telephone Encounter (Addendum)
Pt's spouse called to FU on the medical records that were to be sent from Dr. Lauris Poag office ([place])  Spouse also wanted to FU on documents regarding Worker's Comp.  Spouse is asking for a call back.

## 2022-12-14 NOTE — Telephone Encounter (Signed)
Paperwork is completed, copy sent to scan & placed up front. I left pt's wife a voicemail letting her know. I have not seen any records come through from [facility], so I will re-request those.

## 2022-12-14 NOTE — Therapy (Signed)
OUTPATIENT PHYSICAL THERAPY LOWER EXTREMITY TREATMENT   Patient Name: Richard Vaughn MRN: [id number] DOB:[birth year], 72 y.o., male Today's Date: 12/14/2022  END OF SESSION:  PT End of Session - 12/14/22 1447     Visit Number 8    Number of Visits 16    Date for PT Re-Evaluation 01/07/23    Authorization Type MCR    PT Start Time 1442   arrives late   PT Stop Time 1510    PT Time Calculation (min) 28 min    Behavior During Therapy [facility] for tasks assessed/performed                Past Medical History:  Diagnosis Date   Barrett's esophagus    Chronic back pain    Depression    Gastritis    Mild   Gastroparesis    GERD (gastroesophageal reflux disease)    Headache    History of kidney stones    Hyperglycemia    Hypertension    Nephrolithiasis    Pneumonia    Covid pneumonia   Renal cyst    Sleep apnea    Stricture and stenosis of esophagus    Stroke ([place]) 07/2015   Vision abnormalities    Past Surgical History:  Procedure Laterality Date   CERVICAL [place] SURGERY  2012   COLONOSCOPY     LUMBAR [place] SURGERY  2005, 2010   replaced L4 and L5   SHOULDER ARTHROSCOPY WITH ROTATOR CUFF REPAIR Left 08/14/2020   Procedure: SHOULDER ARTHROSCOPY WITH ROTATOR CUFF REPAIR, SUBACROMIAL DECOMPRESSION;  Surgeon: Tania Ade, MD;  Location: [facility];  Service: Orthopedics;  Laterality: Left;   UPPER GASTROINTESTINAL ENDOSCOPY     Patient Active Problem List   Diagnosis Date Noted   Drug-induced constipation 06/08/2022   Hemiparesis affecting left side as late effect of cerebrovascular accident ([place]) 12/09/2021   Depression, major, recurrent, mild ([place])    Sleep disturbance    Urinary retention    Bradycardia    Essential hypertension    Temperature elevated    Left thyroid nodule    Treatment-emergent central sleep apnea 10/25/2018   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) 10/25/2018   Chronic pain syndrome    Chronic bilateral low back  pain    Benign essential HTN    Tobacco abuse    Hyperlipidemia    History of CVA with residual deficit    Dysphagia, post-stroke    ICH (intracerebral hemorrhage) ([place]) - R thalmic/PLIC d/t HTN 49/70/2637   Insomnia 07/13/2017   PAD (peripheral artery disease) ([place]) 07/13/2017   Hyperlipidemia with target LDL less than 70 07/13/2017   Stroke-like symptom 09/22/2016   Paresthesia 09/22/2016   CVA (cerebral vascular accident) ([place]) 09/22/2016   Cerebrovascular accident (CVA) due to thrombosis of left carotid artery ([place]) 09/23/2015   Primary snoring 09/23/2015   Hypersomnia with sleep apnea 09/23/2015   Obstructive sleep apnea 09/23/2015   Lacunar infarct, acute ([place]) 08/25/2015   Sleep apnea 08/25/2015   Dysarthria    CVD (cardiovascular disease) 08/02/2015   Acute hyperglycemia 85/88/5027   Systolic hypertension with cerebrovascular disease 12/27/2014   Epistaxis 07/28/2012   NEPHROLITHIASIS 05/23/2009   BARRETTS ESOPHAGUS 02/20/2009   Gastroparesis 02/20/2009   RADICULOPATHY 10/21/2008   GERD 10/08/2008   ESOPHAGITIS 08/15/2008   ESOPHAGEAL STRICTURE 08/15/2008   GASTRITIS, ACUTE 08/15/2008   RENAL CYST 08/14/2008   TOBACCO ABUSE 08/08/2008   HEMATURIA, MICROSCOPIC, HX OF 08/08/2008   PULMONARY NODULE 01/10/2008  PCP: Dr Betty Martinique   REFERRING PROVIDER: Dr Betty Martinique   REFERRING DIAG:  Diagnosis  R29.6 (ICD-10-CM) - Frequent falls    THERAPY DIAG:  Repeated falls  Muscle weakness (generalized)  Other low back pain  Rationale for Evaluation and Treatment: Rehabilitation  ONSET DATE: increasing number of falls over the past few weeks  SUBJECTIVE:   SUBJECTIVE STATEMENT:  Pt report he had a fall in the driveway a few days ago and had a very hard time getting up. He had to call his wife in order to get up. He reports no injuries. "I got lucky."   Eval: The patient has a long history of balance disturbances following a stroke in 2016. He reports  over the past several months the balance deficits have increased. He has had 4 major falls. 3 of which were falling backwards. The other fall was from tripping over an object. He is using a cane right now 2nd to decreased ability to use his left arm.   PERTINENT HISTORY: Depression, chronic low back pain, Lumbar surgery 2005 , 2010 ; RTC repair 2021; CVA, HTN, PNA, Stroke 2016. Stroke effected Left UE,   PAIN:  Are you having pain? Yes: NPRS scale: 5/10 Pain location: middle of his lower back  Pain description: aching  Aggravating factors: bending  Relieving factors: rest   PRECAUTIONS: Fall  WEIGHT BEARING RESTRICTIONS: No  FALLS:  Has patient fallen in last 6 months? Yes. 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 a week  PLOF: Independent with use of a cane   PATIENT GOALS:  To improve balance   OBJECTIVE:    TODAY'S TREATMENT:                                                                                                                              DATE:   1/30  Nu step Lvl 9 5 min  Edu about floor to stand transfer and requiring assistance due to contralateral UE and LE  180 turns 4x each way NBOS "turn around/look" on flat surface 2x10 each way NBOS "turn around/look" semi tandem on Airex 2x10 (R sided LOB)  Fall prevention strategies, planning/preparing for falls prevention, asking for assistance when needed  Pt currently using leg press, knee extensions, hamstring curl, and back extension machine in the gym  12/09/22 Pt seen for aquatic therapy today.  Treatment took place in water 3.25-4.5 ft in depth at the [facility]. Temp of water was 91.  Pt entered/exited the pool via stairs with bilat rail with SBA.  Therapist in water with patient entire session.  * forward step leading with LLE and return to neutral with HHA  * side stepping R/L with  HHA * Rt hand on wall:  bilat heel raisesx 10, R/L single heel raises x 5; hip abdct  x 10 each * UE on black barbell and intermittent CGA to barbell:  forward / backward gait; forward marching(challenge/ LLE abdcts); forward step and return to neutral with RLE, then side step and return to neutral changing directions (diagonal, backward, forward, and switching feet)  * UE on black barbell and attempt at SLS x 2-3 sec LLE, 5-8 on RLE;  Rt forward kicks x 10 (to load LLE) * Holding rails:  low back stretch (modified childs pose to return to standing) x 5 reps;  light toe taps to 1st step with RUE on rail;  squats with RUE (light touch) on rail * return to walking forward with barbell -> without any UE support * backwards gait without support (2 LOB backwards with light CGA to correct) * hip (buttocks) bumps off pool wall with cues to thrust hips forward off of wall  Pt requires the buoyancy and hydrostatic pressure of water for support, and to offload joints by unweighting joint load by at least 50 % in navel deep water and by at least 75-80% in chest to neck deep water.  Viscosity of the water is needed for resistance of strengthening. Water current perturbations provides challenge to standing balance requiring increased core activation.  1/23  Nu step Lvl 9 6 min  L LE only shuttle leg press 3x10 50lbs   13f x4 laps Med and lateral gait perturbations; [id number] laps with AP perturbations (working on reactive stepping, increasing BOS width/stance) Fwd high step [id number] training with focus on getting COG over BOS, mechanics, set up, and sequencing for most effiency and safety   12/02/22     Pt seen for aquatic therapy today.  Treatment took place in water 3.25-4.5 ft [place] [facility]  Pt entered/exited the pool via stairs with step-to gait pattern with bilat rail and close supervision.  Therapist in water with patient  * with hands on white  barbell:  forward and backward gait, marching forward (challenge/ LLE abdcts), and side stepping * Holding white barbell with 2 hands:  Rt forward kick (stationary on LLE) x 10; 3 way toe touch x 8 each LE with intermittent CGA to steady * return to walking with barbell: forward/ backward gait; weight shifts onto LLE * hip hinge with forward arm reach ,cues for extended knees x 10 * low back stretch with hands on bilat rails  Pt requires the buoyancy and hydrostatic pressure of water for support, and to offload joints by unweighting joint load by at least 50 % in navel deep water and by at least 75-80% in chest to neck deep water.  Viscosity of the water is needed for resistance of strengthening. Water current perturbations provides challenge to standing balance requiring increased core activation.     PATIENT EDUCATION:  Education details: HEP, balance systems, compensatory measures Person educated: Patient Education method: Explanation, Demonstration, Tactile cues, Verbal cues, and Handouts Education comprehension: verbalized understanding, returned demonstration, verbal cues required, tactile cues required, and needs further education  HOME EXERCISE PROGRAM:   ASSESSMENT:  CLINICAL IMPRESSION: Pt session focused on edu on falls prevention and appropriate assistance when out in the community. No signs of injury from most recent fall today. Pt advised strongly to use AD when walking and to be aware of environment, especially when assistance may not be present. Due to pt's contralateral limb involvement, a full floor to stand will like be difficult due it inability to push up  on on L UE. Pt continues to have R sided LOB due to leaning towards strong side. Plan to continue with L LE strength and balance in aquatic and land setting.  Patient would benefit from skilled therapy to reduce fall risk, improve ability to return to the gym, and decreased lower back pain with functional  activity.  OBJECTIVE IMPAIRMENTS: Abnormal gait, decreased activity tolerance, decreased knowledge of use of DME, decreased mobility, difficulty walking, decreased ROM, decreased strength, decreased safety awareness, increased fascial restrictions, increased muscle spasms, impaired flexibility, postural dysfunction, and pain.   ACTIVITY LIMITATIONS: carrying, lifting, bending, standing, squatting, stairs, transfers, bed mobility, reach over head, and locomotion level  PARTICIPATION LIMITATIONS: meal prep, cleaning, laundry, driving, shopping, community activity, and yard work  PERSONAL FACTORS: 3+ comorbidities: Low back Surgery, frequent falls; Past CVA   are also affecting patient's functional outcome.   REHAB POTENTIAL: Good  CLINICAL DECISION MAKING: Evolving/moderate complexity increasing falls  EVALUATION COMPLEXITY: Moderate   GOALS: Goals reviewed with patient? Yes  SHORT TERM GOALS: Target date: 12/10/2022   Patient will decrease 5x sit to stand time by 10 seconds and before all 5 reps.  Baseline: Goal status: INITIAL  2.  Patient will increase left quad and glut strength by 5 lbs  Baseline:  Goal status: INITIAL  3.  Patient will transfer sit to stand with SBA and decreased use of his hands  Baseline:  Goal status: INITIAL   LONG TERM GOALS: Target date: 01/07/2023    Patient will have no major falls over a month  Baseline:  Goal status: INITIAL  2.  Patients will score > 42 on the BERG to show a decreased fall risk  Baseline:  Goal status: INITIAL  3.  Patient will return to a full gym program  Baseline:  Goal status: INITIAL   PLAN:  PT FREQUENCY: 2x/week  PT DURATION: 8 weeks  PLANNED INTERVENTIONS: Therapeutic exercises, Therapeutic activity, Neuromuscular re-education, Balance training, Gait training, Patient/Family education, Self Care, Joint mobilization, Stair training, DME instructions, Aquatic Therapy, Spinal mobilization, Cryotherapy,  Moist heat, Ultrasound, and Manual therapy  PLAN FOR NEXT SESSION:  In water work on posterior balance and lifting legs high. Work on the same things on land.  Review exercises machines at home.   Daleen Bo PT, DPT 12/14/22 3:30 PM

## 2022-12-16 ENCOUNTER — Ambulatory Visit (HOSPITAL_BASED_OUTPATIENT_CLINIC_OR_DEPARTMENT_OTHER): Payer: Medicare Other | Attending: Family Medicine | Admitting: Physical Therapy

## 2022-12-16 ENCOUNTER — Encounter (HOSPITAL_BASED_OUTPATIENT_CLINIC_OR_DEPARTMENT_OTHER): Payer: Self-pay | Admitting: Physical Therapy

## 2022-12-16 DIAGNOSIS — R296 Repeated falls: Secondary | ICD-10-CM | POA: Insufficient documentation

## 2022-12-16 DIAGNOSIS — M5459 Other low back pain: Secondary | ICD-10-CM | POA: Insufficient documentation

## 2022-12-16 DIAGNOSIS — M6281 Muscle weakness (generalized): Secondary | ICD-10-CM | POA: Insufficient documentation

## 2022-12-16 NOTE — Therapy (Signed)
OUTPATIENT PHYSICAL THERAPY LOWER EXTREMITY TREATMENT   Patient Name: Richard Vaughn MRN: [id number] DOB:03-26-1951, 72 y.o., male Today's Date: 12/16/2022  END OF SESSION:  PT End of Session - 12/16/22 1537     Visit Number 9    Number of Visits 16    Date for PT Re-Evaluation 01/07/23    Authorization Type MCR    PT Start Time 1538   pt late to pool area   PT Stop Time 1616    PT Time Calculation (min) 38 min                Past Medical History:  Diagnosis Date   Barrett's esophagus    Chronic back pain    Depression    Gastritis    Mild   Gastroparesis    GERD (gastroesophageal reflux disease)    Headache    History of kidney stones    Hyperglycemia    Hypertension    Nephrolithiasis    Pneumonia    Covid pneumonia   Renal cyst    Sleep apnea    Stricture and stenosis of esophagus    Stroke ([place]) 07/2015   Vision abnormalities    Past Surgical History:  Procedure Laterality Date   CERVICAL [place] SURGERY  2012   COLONOSCOPY     LUMBAR [place] SURGERY  2005, 2010   replaced L4 and L5   SHOULDER ARTHROSCOPY WITH ROTATOR CUFF REPAIR Left 08/14/2020   Procedure: SHOULDER ARTHROSCOPY WITH ROTATOR CUFF REPAIR, SUBACROMIAL DECOMPRESSION;  Surgeon: Tania Ade, MD;  Location: [facility];  Service: Orthopedics;  Laterality: Left;   UPPER GASTROINTESTINAL ENDOSCOPY     Patient Active Problem List   Diagnosis Date Noted   Drug-induced constipation 06/08/2022   Hemiparesis affecting left side as late effect of cerebrovascular accident ([place]) 12/09/2021   Depression, major, recurrent, mild ([place])    Sleep disturbance    Urinary retention    Bradycardia    Essential hypertension    Temperature elevated    Left thyroid nodule    Treatment-emergent central sleep apnea 10/25/2018   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) 10/25/2018   Chronic pain syndrome    Chronic bilateral low back pain    Benign essential HTN    Tobacco abuse     Hyperlipidemia    History of CVA with residual deficit    Dysphagia, post-stroke    ICH (intracerebral hemorrhage) ([place]) - R thalmic/PLIC d/t HTN 74/10/8785   Insomnia 07/13/2017   PAD (peripheral artery disease) ([place]) 07/13/2017   Hyperlipidemia with target LDL less than 70 07/13/2017   Stroke-like symptom 09/22/2016   Paresthesia 09/22/2016   CVA (cerebral vascular accident) ([place]) 09/22/2016   Cerebrovascular accident (CVA) due to thrombosis of left carotid artery ([place]) 09/23/2015   Primary snoring 09/23/2015   Hypersomnia with sleep apnea 09/23/2015   Obstructive sleep apnea 09/23/2015   Lacunar infarct, acute ([place]) 08/25/2015   Sleep apnea 08/25/2015   Dysarthria    CVD (cardiovascular disease) 08/02/2015   Acute hyperglycemia 76/72/0947   Systolic hypertension with cerebrovascular disease 12/27/2014   Epistaxis 07/28/2012   NEPHROLITHIASIS 05/23/2009   BARRETTS ESOPHAGUS 02/20/2009   Gastroparesis 02/20/2009   RADICULOPATHY 10/21/2008   GERD 10/08/2008   ESOPHAGITIS 08/15/2008   ESOPHAGEAL STRICTURE 08/15/2008   GASTRITIS, ACUTE 08/15/2008   RENAL CYST 08/14/2008   TOBACCO ABUSE 08/08/2008   HEMATURIA, MICROSCOPIC, HX OF 08/08/2008   PULMONARY NODULE 01/10/2008    PCP: Dr Betty Martinique  REFERRING PROVIDER: Dr Betty Martinique   REFERRING DIAG:  Diagnosis  R29.6 (ICD-10-CM) - Frequent falls    THERAPY DIAG:  Repeated falls  Muscle weakness (generalized)  Other low back pain  Rationale for Evaluation and Treatment: Rehabilitation  ONSET DATE: increasing number of falls over the past few weeks  SUBJECTIVE:   SUBJECTIVE STATEMENT:  Pt report he is still sore from recent fall.    Eval: The patient has a long history of balance disturbances following a stroke in 2016. He reports over the past several months the balance deficits have increased. He has had 4 major falls. 3 of which were falling backwards. The other fall was from tripping over an object. He is  using a cane right now 2nd to decreased ability to use his left arm.   PERTINENT HISTORY: Depression, chronic low back pain, Lumbar surgery 2005 , 2010 ; RTC repair 2021; CVA, HTN, PNA, Stroke 2016. Stroke effected Left UE,   PAIN:  Are you having pain? Yes: NPRS scale: 6-7/10 Pain location: middle of his lower back  Pain description: aching  Aggravating factors: bending  Relieving factors: rest   PRECAUTIONS: Fall  WEIGHT BEARING RESTRICTIONS: No  FALLS:  Has patient fallen in last 6 months? Yes. Number of falls 4  LIVING ENVIRONMENT: 2 steps into the house  OCCUPATION: retired   Hobbies: was going to the gym 3x a week and walking 2x a week  PLOF: Independent with use of a cane   PATIENT GOALS:  To improve balance   OBJECTIVE:    TODAY'S TREATMENT:                                                                                                                              DATE:  12/09/22 Pt seen for aquatic therapy today.  Treatment took place in water 3.25-4.5 ft in depth at the [facility]. Temp of water was 91.  Pt entered/exited the pool via stairs with bilat rail with SBA.  Therapist in water with patient entire session.  * box step  with SBA  * side stepping R/L without support * marching backward/forward with UE on black barbell * NBOS with "turn and look behind" * hip hinge with forward arm reach (UE on barbell) * Rt hand on wall:  bilat heel raisesx 10, ; hip abdct x 10 each * hip (buttocks) bumps off pool wall with cues to thrust hips forward off of wall * 2 trials for quadruped (RUE and bilat knees) to/from  stand with Rt hand on bench, knees on blue step with minA for return to standing.  * STS at bench, feet on blue step, with Min A during ascent due to posterior lean, SBA for descent x 6 reps   Pt requires the buoyancy and hydrostatic pressure of water for support, and to offload joints by unweighting joint load by at least 50 % in navel  deep water and by at  least 75-80% in chest to neck deep water.  Viscosity of the water is needed for resistance of strengthening. Water current perturbations provides challenge to standing balance requiring increased core activation.  1/30  Nu step Lvl 9 5 min  Edu about floor to stand transfer and requiring assistance due to contralateral UE and LE  180 turns 4x each way NBOS "turn around/look" on flat surface 2x10 each way NBOS "turn around/look" semi tandem on Airex 2x10 (R sided LOB)  Fall prevention strategies, planning/preparing for falls prevention, asking for assistance when needed  Pt currently using leg press, knee extensions, hamstring curl, and back extension machine in the gym  12/09/22 Pt seen for aquatic therapy today.  Treatment took place in water 3.25-4.5 ft in depth at the [facility]. Temp of water was 91.  Pt entered/exited the pool via stairs with bilat rail with SBA.  Therapist in water with patient entire session.  * forward step leading with LLE and return to neutral with HHA  * side stepping R/L with HHA * Rt hand on wall:  bilat heel raisesx 10, R/L single heel raises x 5; hip abdct x 10 each * UE on black barbell and intermittent CGA to barbell:  forward / backward gait; forward marching(challenge/ LLE abdcts); forward step and return to neutral with RLE, then side step and return to neutral changing directions (diagonal, backward, forward, and switching feet)  * UE on black barbell and attempt at SLS x 2-3 sec LLE, 5-8 on RLE;  Rt forward kicks x 10 (to load LLE) * Holding rails:  low back stretch (modified childs pose to return to standing) x 5 reps;  light toe taps to 1st step with RUE on rail;  squats with RUE (light touch) on rail * return to walking forward with barbell -> without any UE support * backwards gait without support (2 LOB backwards with light CGA to correct) * hip (buttocks) bumps off pool wall with cues to thrust hips forward off  of wall  Pt requires the buoyancy and hydrostatic pressure of water for support, and to offload joints by unweighting joint load by at least 50 % in navel deep water and by at least 75-80% in chest to neck deep water.  Viscosity of the water is needed for resistance of strengthening. Water current perturbations provides challenge to standing balance requiring increased core activation.  1/23  Nu step Lvl 9 6 min  L LE only shuttle leg press 3x10 50lbs   30f x4 laps Med and lateral gait perturbations; 317f4x laps with AP perturbations (working on reactive stepping, increasing BOS width/stance) Fwd high step 3528fSTS training with focus on getting COG over BOS, mechanics, set up, and sequencing for most effiency and safety   12/02/22     Pt seen for aquatic therapy today.  Treatment took place in [facility].25[facility]4.[facility]  Pt entered/exited the pool via stairs with step-to gait pattern with bilat rail and close supervision.  Therapist in water with patient  * with hands on white barbell:  forward and backward gait, marching forward (challenge/ LLE abdcts), and side stepping * Holding white barbell with 2 hands:  Rt forward kick (stationary on LLE) x 10; 3 way toe touch x 8 each LE with intermittent CGA to steady * return to walking with barbell: forward/ backward gait; weight shifts onto LLE * hip hinge with forward arm reach ,  cues for extended knees x 10 * low back stretch with hands on bilat rails  Pt requires the buoyancy and hydrostatic pressure of water for support, and to offload joints by unweighting joint load by at least 50 % in navel deep water and by at least 75-80% in chest to neck deep water.  Viscosity of the water is needed for resistance of strengthening. Water current perturbations provides challenge to standing balance requiring increased core activation.     PATIENT EDUCATION:  Education details: HEP, balance  systems, compensatory measures Person educated: Patient Education method: Explanation, Demonstration, Tactile cues, Verbal cues, and Handouts Education comprehension: verbalized understanding, returned demonstration, verbal cues required, tactile cues required, and needs further education  HOME EXERCISE PROGRAM:   ASSESSMENT:  CLINICAL IMPRESSION: Pt's comfort level in water improving; able to walk without floatation device (forward).  Minor posterior LOB with marching and retro gait, but pt able to self correct.  Trialed semi-quadruped with RUE only; require CGA-Min A to return upright.  Pt also has posterior lean when rising from seat, when not using his hands to propel him upward.  Plan to continue with L LE strength and balance in aquatic and land setting.  Patient would benefit from skilled therapy to reduce fall risk, improve ability to return to the gym, and decreased lower back pain with functional activity. PT to complete 10th visit progress note next visit.   OBJECTIVE IMPAIRMENTS: Abnormal gait, decreased activity tolerance, decreased knowledge of use of DME, decreased mobility, difficulty walking, decreased ROM, decreased strength, decreased safety awareness, increased fascial restrictions, increased muscle spasms, impaired flexibility, postural dysfunction, and pain.   ACTIVITY LIMITATIONS: carrying, lifting, bending, standing, squatting, stairs, transfers, bed mobility, reach over head, and locomotion level  PARTICIPATION LIMITATIONS: meal prep, cleaning, laundry, driving, shopping, community activity, and yard work  PERSONAL FACTORS: 3+ comorbidities: Low back Surgery, frequent falls; Past CVA   are also affecting patient's functional outcome.   REHAB POTENTIAL: Good  CLINICAL DECISION MAKING: Evolving/moderate complexity increasing falls  EVALUATION COMPLEXITY: Moderate   GOALS: Goals reviewed with patient? Yes  SHORT TERM GOALS: Target date: 12/10/2022   Patient will  decrease 5x sit to stand time by 10 seconds and before all 5 reps.  Baseline: Goal status: INITIAL  2.  Patient will increase left quad and glut strength by 5 lbs  Baseline:  Goal status: INITIAL  3.  Patient will transfer sit to stand with SBA and decreased use of his hands  Baseline:  Goal status: INITIAL   LONG TERM GOALS: Target date: 01/07/2023    Patient will have no major falls over a month  Baseline:  Goal status: INITIAL  2.  Patients will score > 42 on the BERG to show a decreased fall risk  Baseline:  Goal status: INITIAL  3.  Patient will return to a full gym program  Baseline:  Goal status: INITIAL   PLAN:  PT FREQUENCY: 2x/week  PT DURATION: 8 weeks  PLANNED INTERVENTIONS: Therapeutic exercises, Therapeutic activity, Neuromuscular re-education, Balance training, Gait training, Patient/Family education, Self Care, Joint mobilization, Stair training, DME instructions, Aquatic Therapy, Spinal mobilization, Cryotherapy, Moist heat, Ultrasound, and Manual therapy  PLAN FOR NEXT SESSION:  In water work on posterior balance and lifting legs high. Work on the same things on land.  Review exercises machines at home.   Kerin Perna, PTA 12/16/22 6:35 PM [facility] [address] [place], [place], [address] Phone: [phone]   Fax:  [phone]

## 2022-12-21 ENCOUNTER — Ambulatory Visit (HOSPITAL_BASED_OUTPATIENT_CLINIC_OR_DEPARTMENT_OTHER): Payer: Medicare Other | Admitting: Physical Therapy

## 2022-12-21 ENCOUNTER — Encounter (HOSPITAL_BASED_OUTPATIENT_CLINIC_OR_DEPARTMENT_OTHER): Payer: Self-pay | Admitting: Physical Therapy

## 2022-12-21 DIAGNOSIS — M5459 Other low back pain: Secondary | ICD-10-CM

## 2022-12-21 DIAGNOSIS — M6281 Muscle weakness (generalized): Secondary | ICD-10-CM

## 2022-12-21 DIAGNOSIS — R296 Repeated falls: Secondary | ICD-10-CM

## 2022-12-21 NOTE — Therapy (Signed)
OUTPATIENT PHYSICAL THERAPY LOWER EXTREMITY TREATMENT  Progress Note Reporting Period 11/12/22  to 12/21/22   See note below for Objective Data and Assessment of Progress/Goals.       Patient Name: Richard Vaughn MRN: [id number] DOB:18-Apr-1951, 72 y.o., male Today's Date: 12/21/2022  END OF SESSION:  PT End of Session - 12/21/22 1433     Visit Number 10    Number of Visits 21    Date for PT Re-Evaluation 03/21/23    Authorization Type MCR    PT Start Time 1433    PT Stop Time 1518    PT Time Calculation (min) 45 min    Activity Tolerance Patient tolerated treatment well    Behavior During Therapy [facility] for tasks assessed/performed                Past Medical History:  Diagnosis Date   Barrett's esophagus    Chronic back pain    Depression    Gastritis    Mild   Gastroparesis    GERD (gastroesophageal reflux disease)    Headache    History of kidney stones    Hyperglycemia    Hypertension    Nephrolithiasis    Pneumonia    Covid pneumonia   Renal cyst    Sleep apnea    Stricture and stenosis of esophagus    Stroke ([place]) 07/2015   Vision abnormalities    Past Surgical History:  Procedure Laterality Date   CERVICAL [place] SURGERY  2012   COLONOSCOPY     LUMBAR [place] SURGERY  2005, 2010   replaced L4 and L5   SHOULDER ARTHROSCOPY WITH ROTATOR CUFF REPAIR Left 08/14/2020   Procedure: SHOULDER ARTHROSCOPY WITH ROTATOR CUFF REPAIR, SUBACROMIAL DECOMPRESSION;  Surgeon: Tania Ade, MD;  Location: [facility];  Service: Orthopedics;  Laterality: Left;   UPPER GASTROINTESTINAL ENDOSCOPY     Patient Active Problem List   Diagnosis Date Noted   Drug-induced constipation 06/08/2022   Hemiparesis affecting left side as late effect of cerebrovascular accident ([place]) 12/09/2021   Depression, major, recurrent, mild ([place])    Sleep disturbance    Urinary retention    Bradycardia    Essential hypertension    Temperature elevated    Left thyroid nodule     Treatment-emergent central sleep apnea 10/25/2018   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) 10/25/2018   Chronic pain syndrome    Chronic bilateral low back pain    Benign essential HTN    Tobacco abuse    Hyperlipidemia    History of CVA with residual deficit    Dysphagia, post-stroke    ICH (intracerebral hemorrhage) ([place]) - R thalmic/PLIC d/t HTN 08/65/7846   Insomnia 07/13/2017   PAD (peripheral artery disease) ([place]) 07/13/2017   Hyperlipidemia with target LDL less than 70 07/13/2017   Stroke-like symptom 09/22/2016   Paresthesia 09/22/2016   CVA (cerebral vascular accident) ([place]) 09/22/2016   Cerebrovascular accident (CVA) due to thrombosis of left carotid artery ([place]) 09/23/2015   Primary snoring 09/23/2015   Hypersomnia with sleep apnea 09/23/2015   Obstructive sleep apnea 09/23/2015   Lacunar infarct, acute ([place]) 08/25/2015   Sleep apnea 08/25/2015   Dysarthria    CVD (cardiovascular disease) 08/02/2015   Acute hyperglycemia 96/29/5284   Systolic hypertension with cerebrovascular disease 12/27/2014   Epistaxis 07/28/2012   NEPHROLITHIASIS 05/23/2009   BARRETTS ESOPHAGUS 02/20/2009   Gastroparesis 02/20/2009   RADICULOPATHY 10/21/2008   GERD 10/08/2008   ESOPHAGITIS 08/15/2008   ESOPHAGEAL  STRICTURE 08/15/2008   GASTRITIS, ACUTE 08/15/2008   RENAL CYST 08/14/2008   TOBACCO ABUSE 08/08/2008   HEMATURIA, MICROSCOPIC, HX OF 08/08/2008   PULMONARY NODULE 01/10/2008    PCP: Dr Betty Martinique   REFERRING PROVIDER: Dr Betty Martinique   REFERRING DIAG:  Diagnosis  R29.6 (ICD-10-CM) - Frequent falls    THERAPY DIAG:  Repeated falls  Muscle weakness (generalized)  Other low back pain  Rationale for Evaluation and Treatment: Rehabilitation  ONSET DATE: increasing number of falls over the past few weeks  SUBJECTIVE:   SUBJECTIVE STATEMENT:  Pt reports no pain from last session but did have LE and back soreness. Pt denies falls since last  session. Pt reports he feels that he is 25% better since starting therapy.   Eval: The patient has a long history of balance disturbances following a stroke in 2016. He reports over the past several months the balance deficits have increased. He has had 4 major falls. 3 of which were falling backwards. The other fall was from tripping over an object. He is using a cane right now 2nd to decreased ability to use his left arm.   PERTINENT HISTORY: Depression, chronic low back pain, Lumbar surgery 2005 , 2010 ; RTC repair 2021; CVA, HTN, PNA, Stroke 2016. Stroke effected Left UE,   PAIN:  Are you having pain? Yes: NPRS scale: 6-7/10 Pain location: middle of his lower back  Pain description: aching  Aggravating factors: bending  Relieving factors: rest   PRECAUTIONS: Fall  WEIGHT BEARING RESTRICTIONS: No  FALLS:  Has patient fallen in last 6 months? Yes. 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 a week  PLOF: Independent with use of a cane   PATIENT GOALS:  To improve balance   OBJECTIVE:   FOTO 42 pts 2/6   BERG Balance Test                                              Date: 2/6   Sit to Stand 3  Standing unsupported 3  Sitting with back unsupported but feet supported 4  Stand to sit  2  Transfers  3  Standing unsupported with eyes closed 4  Standing unsupported feet together 3  From standing position, reach forward with outstretched arm 3  From standing position, pick up object from floor 4  From standing position, turn and look behind over each shoulder 4  Turn 360 3  Standing unsupported, alternately place foot on step 0  Standing unsupported, one foot in front 3  Standing on one leg 0  Total:  39/56     5xSTS: 44s with UE assist  STS Transfers: improved fwd trunk lean during transition, reduction of posterior LOB, able to come to full standing  independently   TODAY'S TREATMENT:  DATE:   2/6  Stand fwd march 2x20  NBOS "turn around/look" on flat surface 2x10 each way NBOS "turn around/look" semi tandem on Airex 2x10 (R sided LOB)  13f x4 laps Med and lateral gait perturbations; 363f4x laps with AP perturbations (working on reactive stepping, increasing BOS width/stance) Fwd high step 3580fx  12/09/22 Pt seen for aquatic therapy today.  Treatment took place in water 3.25-4.5 ft [facility]  Pt entered/exited the pool via stairs with bilat rail with SBA.  Therapist in water with patient entire session.  * box step  with SBA  * side stepping R/L without support * marching backward/forward with UE on black barbell * NBOS with "turn and look behind" * hip hinge with forward arm reach (UE on barbell) * Rt hand on wall:  bilat heel raisesx 10, ; hip abdct x 10 each * hip (buttocks) bumps off pool wall with cues to thrust hips forward off of wall * 2 trials for quadruped (RUE and bilat knees) to/from  stand with Rt hand on bench, knees on blue step with minA for return to standing.  * STS at bench, feet on blue step, with Min A during ascent due to posterior lean, SBA for descent x 6 reps   Pt requires the buoyancy and hydrostatic pressure of water for support, and to offload joints by unweighting joint load by at least 50 % in navel deep water and by at least 75-80% in chest to neck deep water.  Viscosity of the water is needed for resistance of strengthening. Water current perturbations provides challenge to standing balance requiring increased core activation.    PATIENT EDUCATION:  Education details: HEP, balance systems, exam findings, safety with transfers Person educated: Patient Education method: Explanation, Demonstration, Tactile cues, Verbal  cues, and Handouts Education comprehension: verbalized understanding, returned demonstration, verbal cues required, tactile cues required, and needs further education  HOME EXERCISE PROGRAM: Verbally discussed; gym machine usage and current walking program  ASSESSMENT:  CLINICAL IMPRESSION: Pt with objective improvement in functional balance as well as improvement in functional LE strength. Pt with improved transfer mechanics as compared to previous session- able to perform without VC for sequencing or technique. Pt continues to have expected L LE strength deficits given history of CVA. From an exercise perspective, pt requires less assistance with balance exercise and has decreased LOB as compared to previous assessment. Pt is making steady progress towards goals and falls prevention. L LE strength and motor control deficits will continue to be a limiting factor but pt's reactive stepping and functional mobility continues to improve. Plan to continue with land and aquatic visits. Pt would benefit from continued skilled therapy in order to reach goals and maximize functional LE strength and balance for prevention of future falls and further functional decline.   OBJECTIVE IMPAIRMENTS: Abnormal gait, decreased activity tolerance, decreased knowledge of use of DME, decreased mobility, difficulty walking, decreased ROM, decreased strength, decreased safety awareness, increased fascial restrictions, increased muscle spasms, impaired flexibility, postural dysfunction, and pain.   ACTIVITY LIMITATIONS: carrying, lifting, bending, standing, squatting, stairs, transfers, bed mobility, reach over head, and locomotion level  PARTICIPATION LIMITATIONS: meal prep, cleaning, laundry, driving, shopping, community activity, and yard work  PERSONAL FACTORS: 3+ comorbidities: Low back Surgery, frequent falls; Past CVA   are also affecting patient's functional outcome.   REHAB POTENTIAL: Good  CLINICAL DECISION  MAKING: Evolving/moderate complexity increasing falls  EVALUATION COMPLEXITY:  Moderate   GOALS: Goals reviewed with patient? Yes  SHORT TERM GOALS: Target date: 12/10/2022   Patient will decrease 5x sit to stand time by 10 seconds and before all 5 reps.  Baseline: Goal status: ongoing; able to complete 5  2.  Patient will increase left quad and glut strength by 5 lbs  Baseline:  Goal status: Not tested today  3.  Patient will transfer sit to stand with SBA and decreased use of his hands  Baseline:  Goal status: met   LONG TERM GOALS: Target date: 03/21/23    Patient will have no major falls over a month  Baseline:  Goal status: ongoing  2.  Patients will score > 42 on the BERG to show a decreased fall risk  Baseline:  Goal status: ongoing  3.  Patient will return to a full gym program  Baseline:  Goal status: ongoing   PLAN:  PT FREQUENCY: 2x/week  PT DURATION: 12 wks (aim for D/C in 8)  PLANNED INTERVENTIONS: Therapeutic exercises, Therapeutic activity, Neuromuscular re-education, Balance training, Gait training, Patient/Family education, Self Care, Joint mobilization, Stair training, DME instructions, Aquatic Therapy, Spinal mobilization, Cryotherapy, Moist heat, Ultrasound, and Manual therapy  PLAN FOR NEXT SESSION:  In water work on posterior balance and lifting legs high. Work on the same things on land.  Review exercises machines at home.   Daleen Bo PT, DPT 12/21/22 3:42 PM

## 2022-12-23 ENCOUNTER — Encounter (HOSPITAL_BASED_OUTPATIENT_CLINIC_OR_DEPARTMENT_OTHER): Payer: Self-pay | Admitting: Physical Therapy

## 2022-12-23 ENCOUNTER — Ambulatory Visit (HOSPITAL_BASED_OUTPATIENT_CLINIC_OR_DEPARTMENT_OTHER): Payer: Medicare Other | Admitting: Physical Therapy

## 2022-12-23 DIAGNOSIS — M5459 Other low back pain: Secondary | ICD-10-CM

## 2022-12-23 DIAGNOSIS — R296 Repeated falls: Secondary | ICD-10-CM | POA: Diagnosis not present

## 2022-12-23 DIAGNOSIS — M6281 Muscle weakness (generalized): Secondary | ICD-10-CM

## 2022-12-23 NOTE — Therapy (Addendum)
OUTPATIENT PHYSICAL THERAPY LOWER EXTREMITY TREATMENT    Patient Name: Richard Vaughn MRN: [id number] DOB:[birth year], 72 y.o., male Today's Date: 12/23/2022  END OF SESSION:  PT End of Session - 12/23/22 1542     Visit Number 11    Number of Visits 21    Date for PT Re-Evaluation 03/21/23    Authorization Type MCR    PT Start Time 1539   pt arrived late to pool   PT Stop Time 1615    PT Time Calculation (min) 36 min    Behavior During Therapy [facility] for tasks assessed/performed                Past Medical History:  Diagnosis Date   Barrett's esophagus    Chronic back pain    Depression    Gastritis    Mild   Gastroparesis    GERD (gastroesophageal reflux disease)    Headache    History of kidney stones    Hyperglycemia    Hypertension    Nephrolithiasis    Pneumonia    Covid pneumonia   Renal cyst    Sleep apnea    Stricture and stenosis of esophagus    Stroke ([place]) 07/2015   Vision abnormalities    Past Surgical History:  Procedure Laterality Date   CERVICAL [place] SURGERY  2012   COLONOSCOPY     LUMBAR [place] SURGERY  2005, 2010   replaced L4 and L5   SHOULDER ARTHROSCOPY WITH ROTATOR CUFF REPAIR Left 08/14/2020   Procedure: SHOULDER ARTHROSCOPY WITH ROTATOR CUFF REPAIR, SUBACROMIAL DECOMPRESSION;  Surgeon: Tania Ade, MD;  Location: [facility];  Service: Orthopedics;  Laterality: Left;   UPPER GASTROINTESTINAL ENDOSCOPY     Patient Active Problem List   Diagnosis Date Noted   Drug-induced constipation 06/08/2022   Hemiparesis affecting left side as late effect of cerebrovascular accident ([place]) 12/09/2021   Depression, major, recurrent, mild ([place])    Sleep disturbance    Urinary retention    Bradycardia    Essential hypertension    Temperature elevated    Left thyroid nodule    Treatment-emergent central sleep apnea 10/25/2018   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) 10/25/2018   Chronic pain syndrome    Chronic bilateral  low back pain    Benign essential HTN    Tobacco abuse    Hyperlipidemia    History of CVA with residual deficit    Dysphagia, post-stroke    ICH (intracerebral hemorrhage) ([place]) - R thalmic/PLIC d/t HTN 123456   Insomnia 07/13/2017   PAD (peripheral artery disease) ([place]) 07/13/2017   Hyperlipidemia with target LDL less than 70 07/13/2017   Stroke-like symptom 09/22/2016   Paresthesia 09/22/2016   CVA (cerebral vascular accident) ([place]) 09/22/2016   Cerebrovascular accident (CVA) due to thrombosis of left carotid artery ([place]) 09/23/2015   Primary snoring 09/23/2015   Hypersomnia with sleep apnea 09/23/2015   Obstructive sleep apnea 09/23/2015   Lacunar infarct, acute ([place]) 08/25/2015   Sleep apnea 08/25/2015   Dysarthria    CVD (cardiovascular disease) 08/02/2015   Acute hyperglycemia 0000000   Systolic hypertension with cerebrovascular disease 12/27/2014   Epistaxis 07/28/2012   NEPHROLITHIASIS 05/23/2009   BARRETTS ESOPHAGUS 02/20/2009   Gastroparesis 02/20/2009   RADICULOPATHY 10/21/2008   GERD 10/08/2008   ESOPHAGITIS 08/15/2008   ESOPHAGEAL STRICTURE 08/15/2008   GASTRITIS, ACUTE 08/15/2008   RENAL CYST 08/14/2008   TOBACCO ABUSE 08/08/2008   HEMATURIA, MICROSCOPIC, HX OF 08/08/2008   PULMONARY  NODULE 01/10/2008    PCP: Dr Betty Martinique   REFERRING PROVIDER: Dr Betty Martinique   REFERRING DIAG:  Diagnosis  R29.6 (ICD-10-CM) - Frequent falls    THERAPY DIAG:  Repeated falls  Muscle weakness (generalized)  Other low back pain  Rationale for Evaluation and Treatment: Rehabilitation  ONSET DATE: increasing number of falls over the past few weeks  SUBJECTIVE:   SUBJECTIVE STATEMENT:  Pt reports his back pain remains constant.  He reports no falls since last one.  His wife is present on deck during session.   Eval: The patient has a long history of balance disturbances following a stroke in 2016. He reports over the past several months the balance  deficits have increased. He has had 4 major falls. 3 of which were falling backwards. The other fall was from tripping over an object. He is using a cane right now 2nd to decreased ability to use his left arm.   PERTINENT HISTORY: Depression, chronic low back pain, Lumbar surgery 2005 , 2010 ; RTC repair 2021; CVA, HTN, PNA, Stroke 2016. Stroke effected Left UE,   PAIN:  Are you having pain? Yes: NPRS scale: 5/10 Pain location: middle of his lower back  Pain description: aching  Aggravating factors: bending  Relieving factors: rest   PRECAUTIONS: Fall  WEIGHT BEARING RESTRICTIONS: No  FALLS:  Has patient fallen in last 6 months? Yes. 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 a week  PLOF: Independent with use of a cane   PATIENT GOALS:  To improve balance   OBJECTIVE:   FOTO 42 pts 2/6   BERG Balance Test                                              Date: 12/21/22   Sit to Stand 3  Standing unsupported 3  Sitting with back unsupported but feet supported 4  Stand to sit  2  Transfers  3  Standing unsupported with eyes closed 4  Standing unsupported feet together 3  From standing position, reach forward with outstretched arm 3  From standing position, pick up object from floor 4  From standing position, turn and look behind over each shoulder 4  Turn 360 3  Standing unsupported, alternately place foot on step 0  Standing unsupported, one foot in front 3  Standing on one leg 0  Total:  39/56     5xSTS: 44s with UE assist  STS Transfers: improved fwd trunk lean during transition, reduction of posterior LOB, able to come to full standing independently   TODAY'S TREATMENT:  DATE:  12/23/22: Pt seen for aquatic therapy today.  Treatment took place  in water 3.25-4.5 ft in depth at the [facility]. Temp of water was 91.  Pt entered/exited the pool via stairs with bilat rail with SBA.  Therapist on deck entire session.   Holding wall: side stepping R/L With light Rt hand support on wall:  SLS  (R/L) x 20s x 2 Without support:  forward gait, backward gait, side stepping  Holding wall with RUE:  LLE kicks front/ side x 10 each hip hinge with forward arm reach (RUE on barbell)- hip bump to wall and hip thrust forward to return to neutral Standard stance wth "turn and look behind" x 3 Holding barbell with RUE, high knee marching in place and then forward Alternating Toe taps to step in water with intermittent Rt hand on wall to steady STS at bench without UE support x 10 Pt requires the buoyancy and hydrostatic pressure of water for support, and to offload joints by unweighting joint load by at least 50 % in navel deep water and by at least 75-80% in chest to neck deep water.  Viscosity of the water is needed for resistance of strengthening. Water current perturbations provides challenge to standing balance requiring increased core activation.    2/6  Stand fwd march 2x20  NBOS "turn around/look" on flat surface 2x10 each way NBOS "turn around/look" semi tandem on Airex 2x10 (R sided LOB)  79f x4 laps Med and lateral gait perturbations; 391f4x laps with AP perturbations (working on reactive stepping, increasing BOS width/stance) Fwd high step 3521fx  12/09/22 Pt seen for aquatic therapy today.  Treatment took [facility]4.[facility]  Pt entered/exited the pool via stairs with bilat rail with SBA.  Therapist in water with patient entire session.  * box step  with SBA  * side stepping R/L without support * marching backward/forward with UE on black barbell * NBOS with "turn and look behind" * hip hinge with forward arm reach (UE on barbell) * Rt hand  on wall:  bilat heel raisesx 10, ; hip abdct x 10 each * hip (buttocks) bumps off pool wall with cues to thrust hips forward off of wall * 2 trials for quadruped (RUE and bilat knees) to/from  stand with Rt hand on bench, knees on blue step with minA for return to standing.  * STS at bench, feet on blue step, with Min A during ascent due to posterior lean, SBA for descent x 6 reps   PATIENT EDUCATION:  Education details: aquatic progressions / modifications  Person educated: Patient Education method: Explanation, Demonstration, Tactile cues, Verbal cues Education comprehension: verbalized understanding, returned demonstration, verbal cues required, tactile cues required, and needs further education  HOME EXERCISE PROGRAM: Verbally discussed; gym machine usage and current walking program  ASSESSMENT:  CLINICAL IMPRESSION: Pt was able to complete session while taking direction from therapist on deck.  Only minor loss of balance with high knee marching and retro gait and pt was able to self correct without difficulty. Alternating toe taps without support was very challenging as he needed intermittent UE to steady when lifting LLE off of step to return to neutral. STS was completed without any hands on assistance, with good balance upon rising.  He occasionally required RUE support on floatation device to assist in steadying him, but is requiring less UE support each visit.  Pt is making steady progress towards goals and falls prevention. Plan to continue with land and aquatic visits. Pt would benefit from continued skilled therapy in order to reach goals and maximize functional LE strength and balance for prevention of future falls and further functional decline.   OBJECTIVE IMPAIRMENTS: Abnormal gait, decreased activity tolerance, decreased knowledge of use of DME, decreased mobility, difficulty walking, decreased ROM, decreased strength, decreased safety awareness, increased fascial restrictions,  increased muscle spasms, impaired flexibility, postural dysfunction, and pain.   ACTIVITY LIMITATIONS: carrying, lifting, bending, standing, squatting, stairs, transfers, bed mobility, reach over head, and locomotion level  PARTICIPATION LIMITATIONS: meal prep, cleaning, laundry, driving, shopping, community activity, and yard work  PERSONAL FACTORS: 3+ comorbidities: Low back Surgery, frequent falls; Past CVA   are also affecting patient's functional outcome.   REHAB POTENTIAL: Good  CLINICAL DECISION MAKING: Evolving/moderate complexity increasing falls  EVALUATION COMPLEXITY: Moderate   GOALS: Goals reviewed with patient? Yes  SHORT TERM GOALS: Target date: 12/10/2022   Patient will decrease 5x sit to stand time by 10 seconds and before all 5 reps.  Baseline: Goal status: ongoing; able to complete 5  2.  Patient will increase left quad and glut strength by 5 lbs  Baseline:  Goal status: Not tested today  3.  Patient will transfer sit to stand with SBA and decreased use of his hands  Baseline:  Goal status: met   LONG TERM GOALS: Target date: 03/21/23    Patient will have no major falls over a month  Baseline:  Goal status: ongoing  2.  Patients will score > 42 on the BERG to show a decreased fall risk  Baseline:  Goal status: ongoing  3.  Patient will return to a full gym program  Baseline:  Goal status: ongoing   PLAN:  PT FREQUENCY: 2x/week  PT DURATION: 12 wks (aim for D/C in 8)  PLANNED INTERVENTIONS: Therapeutic exercises, Therapeutic activity, Neuromuscular re-education, Balance training, Gait training, Patient/Family education, Self Care, Joint mobilization, Stair training, DME instructions, Aquatic Therapy, Spinal mobilization, Cryotherapy, Moist heat, Ultrasound, and Manual therapy  PLAN FOR NEXT SESSION:  In water work on posterior balance and lifting legs high. Work on the same things on land.    Kerin Perna, PTA 12/23/22 6:14  PM [facility] [address] [place], [place], [address] Phone: [phone]   Fax:  [phone]

## 2022-12-28 ENCOUNTER — Ambulatory Visit (HOSPITAL_BASED_OUTPATIENT_CLINIC_OR_DEPARTMENT_OTHER): Payer: Medicare Other | Admitting: Physical Therapy

## 2022-12-28 ENCOUNTER — Encounter (HOSPITAL_BASED_OUTPATIENT_CLINIC_OR_DEPARTMENT_OTHER): Payer: Self-pay | Admitting: Physical Therapy

## 2022-12-28 DIAGNOSIS — M6281 Muscle weakness (generalized): Secondary | ICD-10-CM

## 2022-12-28 DIAGNOSIS — R296 Repeated falls: Secondary | ICD-10-CM

## 2022-12-28 DIAGNOSIS — M5459 Other low back pain: Secondary | ICD-10-CM

## 2022-12-28 NOTE — Therapy (Signed)
OUTPATIENT PHYSICAL THERAPY LOWER EXTREMITY TREATMENT    Patient Name: Richard Vaughn MRN: [id number] DOB:06-Aug-1951, 72 y.o., male Today's Date: 12/28/2022  END OF SESSION:  PT End of Session - 12/28/22 1443     Visit Number 12    Number of Visits 21    Date for PT Re-Evaluation 03/21/23    Authorization Type MCR    PT Start Time 1433    PT Stop Time [id number]    PT Time Calculation (min) 40 min    Behavior During Therapy [facility] for tasks assessed/performed                 Past Medical History:  Diagnosis Date   Barrett's esophagus    Chronic back pain    Depression    Gastritis    Mild   Gastroparesis    GERD (gastroesophageal reflux disease)    Headache    History of kidney stones    Hyperglycemia    Hypertension    Nephrolithiasis    Pneumonia    Covid pneumonia   Renal cyst    Sleep apnea    Stricture and stenosis of esophagus    Stroke ([place]) 07/2015   Vision abnormalities    Past Surgical History:  Procedure Laterality Date   CERVICAL [place] SURGERY  2012   COLONOSCOPY     LUMBAR [place] SURGERY  2005, 2010   replaced L4 and L5   SHOULDER ARTHROSCOPY WITH ROTATOR CUFF REPAIR Left 08/14/2020   Procedure: SHOULDER ARTHROSCOPY WITH ROTATOR CUFF REPAIR, SUBACROMIAL DECOMPRESSION;  Surgeon: Tania Ade, MD;  Location: [facility];  Service: Orthopedics;  Laterality: Left;   UPPER GASTROINTESTINAL ENDOSCOPY     Patient Active Problem List   Diagnosis Date Noted   Drug-induced constipation 06/08/2022   Hemiparesis affecting left side as late effect of cerebrovascular accident ([place]) 12/09/2021   Depression, major, recurrent, mild ([place])    Sleep disturbance    Urinary retention    Bradycardia    Essential hypertension    Temperature elevated    Left thyroid nodule    Treatment-emergent central sleep apnea 10/25/2018   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) 10/25/2018   Chronic pain syndrome    Chronic bilateral low back pain     Benign essential HTN    Tobacco abuse    Hyperlipidemia    History of CVA with residual deficit    Dysphagia, post-stroke    ICH (intracerebral hemorrhage) ([place]) - R thalmic/PLIC d/t HTN [id number]   Insomnia 07/13/2017   PAD (peripheral artery disease) ([place]) 07/13/2017   Hyperlipidemia with target LDL less than 70 07/13/2017   Stroke-like symptom 09/22/2016   Paresthesia 09/22/2016   CVA (cerebral vascular accident) ([place]) 09/22/2016   Cerebrovascular accident (CVA) due to thrombosis of left carotid artery ([place]) 09/23/2015   Primary snoring 09/23/2015   Hypersomnia with sleep apnea 09/23/2015   Obstructive sleep apnea 09/23/2015   Lacunar infarct, acute ([place]) 08/25/2015   Sleep apnea 08/25/2015   Dysarthria    CVD (cardiovascular disease) 08/02/2015   Acute hyperglycemia 0000000   Systolic hypertension with cerebrovascular disease 12/27/2014   Epistaxis 07/28/2012   NEPHROLITHIASIS 05/23/2009   BARRETTS ESOPHAGUS 02/20/2009   Gastroparesis 02/20/2009   RADICULOPATHY 10/21/2008   GERD 10/08/2008   ESOPHAGITIS 08/15/2008   ESOPHAGEAL STRICTURE 08/15/2008   GASTRITIS, ACUTE 08/15/2008   RENAL CYST 08/14/2008   TOBACCO ABUSE 08/08/2008   HEMATURIA, MICROSCOPIC, HX OF 08/08/2008   PULMONARY NODULE 01/10/2008  PCP: Dr Betty Martinique   REFERRING PROVIDER: Dr Betty Martinique   REFERRING DIAG:  Diagnosis  R29.6 (ICD-10-CM) - Frequent falls    THERAPY DIAG:  Repeated falls  Muscle weakness (generalized)  Other low back pain  Rationale for Evaluation and Treatment: Rehabilitation  ONSET DATE: increasing number of falls over the past few weeks  SUBJECTIVE:   SUBJECTIVE STATEMENT:  Pt reports the back pain is similar. He has had no falls or near falls since last visit.   Eval: The patient has a long history of balance disturbances following a stroke in 2016. He reports over the past several months the balance deficits have increased. He has had 4 major falls. 3 of  which were falling backwards. The other fall was from tripping over an object. He is using a cane right now 2nd to decreased ability to use his left arm.   PERTINENT HISTORY: Depression, chronic low back pain, Lumbar surgery 2005 , 2010 ; RTC repair 2021; CVA, HTN, PNA, Stroke 2016. Stroke effected Left UE,   PAIN:  Are you having pain? Yes: NPRS scale: 5/10 Pain location: middle of his lower back  Pain description: aching  Aggravating factors: bending  Relieving factors: rest   PRECAUTIONS: Fall  WEIGHT BEARING RESTRICTIONS: No  FALLS:  Has patient fallen in last 6 months? Yes. 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 a week  PLOF: Independent with use of a cane   PATIENT GOALS:  To improve balance   OBJECTIVE:   FOTO 42 pts 2/6   BERG Balance Test                                              Date: 12/21/22   Sit to Stand 3  Standing unsupported 3  Sitting with back unsupported but feet supported 4  Stand to sit  2  Transfers  3  Standing unsupported with eyes closed 4  Standing unsupported feet together 3  From standing position, reach forward with outstretched arm 3  From standing position, pick up object from floor 4  From standing position, turn and look behind over each shoulder 4  Turn 360 3  Standing unsupported, alternately place foot on step 0  Standing unsupported, one foot in front 3  Standing on one leg 0  Total:  39/56     5xSTS: 44s with UE assist  STS Transfers: improved fwd trunk lean during transition, reduction of posterior LOB, able to come to full standing independently   TODAY'S TREATMENT:  DATE:    2/6 Nu step Lvl 9 warm up 6 min UE and LE  Staggered stance squat on airex 2x8 NBOS reach with UE head hips on  airex 2x20 Resisted STS with focus on eccentric lower (YTB at waist) 3x5  Stand fwd march 2x20 Minisquat at bar with Ue support 3x8 Lateral march at table 3x laps  Edu about safe home hip flexion exercise in seated and standing     12/23/22: Pt seen for aquatic therapy today.  Treatment took place in water 3.25-4.5 ft in depth at the [facility]. Temp of water was 91.  Pt entered/exited the pool via stairs with bilat rail with SBA.  Therapist on deck entire session.   Holding wall: side stepping R/L With light Rt hand support on wall:  SLS  (R/L) x 20s x 2 Without support:  forward gait, backward gait, side stepping  Holding wall with RUE:  LLE kicks front/ side x 10 each hip hinge with forward arm reach (RUE on barbell)- hip bump to wall and hip thrust forward to return to neutral Standard stance wth "turn and look behind" x 3 Holding barbell with RUE, high knee marching in place and then forward Alternating Toe taps to step in water with intermittent Rt hand on wall to steady STS at bench without UE support x 10 Pt requires the buoyancy and hydrostatic pressure of water for support, and to offload joints by unweighting joint load by at least 50 % in navel deep water and by at least 75-80% in chest to neck deep water.  Viscosity of the water is needed for resistance of strengthening. Water current perturbations provides challenge to standing balance requiring increased core activation.    2/6  Stand fwd march 2x20  NBOS "turn around/look" on flat surface 2x10 each way NBOS "turn around/look" semi tandem on Airex 2x10 (R sided LOB)  20f x4 laps Med and lateral gait perturbations; 341f4x laps with AP perturbations (working on reactive stepping, increasing BOS width/stance) Fwd high step 3521fx  12/09/22 Pt seen for aquatic therapy today.  Treatment took place in water 3.25[facility]4.[facility]  Pt entered/exited the  pool via stairs with bilat rail with SBA.  Therapist in water with patient entire session.  * box step  with SBA  * side stepping R/L without support * marching backward/forward with UE on black barbell * NBOS with "turn and look behind" * hip hinge with forward arm reach (UE on barbell) * Rt hand on wall:  bilat heel raisesx 10, ; hip abdct x 10 each * hip (buttocks) bumps off pool wall with cues to thrust hips forward off of wall * 2 trials for quadruped (RUE and bilat knees) to/from  stand with Rt hand on bench, knees on blue step with minA for return to standing.  * STS at bench, feet on blue step, with Min A during ascent due to posterior lean, SBA for descent x 6 reps   PATIENT EDUCATION:  Education details: HEP, balance systems  Person educated: Patient Education method: Explanation, Demonstration, Tactile cues, Verbal cues, and Handouts Education comprehension: verbalized understanding, returned demonstration, verbal cues required, tactile cues required, and needs further education  HOME EXERCISE PROGRAM: Verbally discussed; gym machine usage and current walking program  ASSESSMENT:  CLINICAL IMPRESSION: Pt session focused today on functional mobility and obstacle navigation. Pt initially with difficulty performing UE march/hip flexion but was  able to improve fluidity of motion with increased reps suggesting improvement in motor recruitment with blocked practice. Pt does have LE fatigue during session that limits balance, especially with functional squatting/transfers. Pt is improving with functional mobility on land. Continue with balance and LE strength. Pt would benefit from continued skilled therapy in order to reach goals and maximize functional LE strength and balance for prevention of future falls and further functional decline.   OBJECTIVE IMPAIRMENTS: Abnormal gait, decreased activity tolerance, decreased knowledge of use of DME, decreased mobility, difficulty walking,  decreased ROM, decreased strength, decreased safety awareness, increased fascial restrictions, increased muscle spasms, impaired flexibility, postural dysfunction, and pain.   ACTIVITY LIMITATIONS: carrying, lifting, bending, standing, squatting, stairs, transfers, bed mobility, reach over head, and locomotion level  PARTICIPATION LIMITATIONS: meal prep, cleaning, laundry, driving, shopping, community activity, and yard work  PERSONAL FACTORS: 3+ comorbidities: Low back Surgery, frequent falls; Past CVA   are also affecting patient's functional outcome.   REHAB POTENTIAL: Good  CLINICAL DECISION MAKING: Evolving/moderate complexity increasing falls  EVALUATION COMPLEXITY: Moderate   GOALS: Goals reviewed with patient? Yes  SHORT TERM GOALS: Target date: 12/10/2022   Patient will decrease 5x sit to stand time by 10 seconds and before all 5 reps.  Baseline: Goal status: ongoing; able to complete 5  2.  Patient will increase left quad and glut strength by 5 lbs  Baseline:  Goal status: Not tested today  3.  Patient will transfer sit to stand with SBA and decreased use of his hands  Baseline:  Goal status: met   LONG TERM GOALS: Target date: 03/21/23    Patient will have no major falls over a month  Baseline:  Goal status: ongoing  2.  Patients will score > 42 on the BERG to show a decreased fall risk  Baseline:  Goal status: ongoing  3.  Patient will return to a full gym program  Baseline:  Goal status: ongoing   PLAN:  PT FREQUENCY: 2x/week  PT DURATION: 12 wks (aim for D/C in 8)  PLANNED INTERVENTIONS: Therapeutic exercises, Therapeutic activity, Neuromuscular re-education, Balance training, Gait training, Patient/Family education, Self Care, Joint mobilization, Stair training, DME instructions, Aquatic Therapy, Spinal mobilization, Cryotherapy, Moist heat, Ultrasound, and Manual therapy  PLAN FOR NEXT SESSION:  In water work on posterior balance and lifting  legs high. Work on the same things on land.   Daleen Bo PT, DPT 12/28/22 3:20 PM

## 2022-12-30 ENCOUNTER — Ambulatory Visit (HOSPITAL_BASED_OUTPATIENT_CLINIC_OR_DEPARTMENT_OTHER): Payer: Medicare Other | Admitting: Physical Therapy

## 2022-12-30 DIAGNOSIS — M6281 Muscle weakness (generalized): Secondary | ICD-10-CM

## 2022-12-30 DIAGNOSIS — M5459 Other low back pain: Secondary | ICD-10-CM | POA: Diagnosis not present

## 2022-12-30 DIAGNOSIS — R296 Repeated falls: Secondary | ICD-10-CM | POA: Diagnosis not present

## 2022-12-30 NOTE — Therapy (Signed)
OUTPATIENT PHYSICAL THERAPY LOWER EXTREMITY TREATMENT    Patient Name: Richard Vaughn MRN: [id number] DOB:03/10/1951, 72 y.o., male Today's Date: 12/30/2022  END OF SESSION:  PT End of Session - 12/30/22 1552     Visit Number 13    Number of Visits 21    Date for PT Re-Evaluation 03/21/23    Authorization Type MCR    PT Start Time [id number]    PT Stop Time [id number]    PT Time Calculation (min) 40 min    Behavior During Therapy WFL for tasks assessed/performed                 Past Medical History:  Diagnosis Date   Barrett's esophagus    Chronic back pain    Depression    Gastritis    Mild   Gastroparesis    GERD (gastroesophageal reflux disease)    Headache    History of kidney stones    Hyperglycemia    Hypertension    Nephrolithiasis    Pneumonia    Covid pneumonia   Renal cyst    Sleep apnea    Stricture and stenosis of esophagus    Stroke ([place]) 07/2015   Vision abnormalities    Past Surgical History:  Procedure Laterality Date   CERVICAL [place] SURGERY  2012   COLONOSCOPY     LUMBAR [place] SURGERY  2005, 2010   replaced L4 and L5   SHOULDER ARTHROSCOPY WITH ROTATOR CUFF REPAIR Left 08/14/2020   Procedure: SHOULDER ARTHROSCOPY WITH ROTATOR CUFF REPAIR, SUBACROMIAL DECOMPRESSION;  Surgeon: Tania Ade, MD;  Location: [facility];  Service: Orthopedics;  Laterality: Left;   UPPER GASTROINTESTINAL ENDOSCOPY     Patient Active Problem List   Diagnosis Date Noted   Drug-induced constipation 06/08/2022   Hemiparesis affecting left side as late effect of cerebrovascular accident ([place]) 12/09/2021   Depression, major, recurrent, mild ([place])    Sleep disturbance    Urinary retention    Bradycardia    Essential hypertension    Temperature elevated    Left thyroid nodule    Treatment-emergent central sleep apnea 10/25/2018   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) 10/25/2018   Chronic pain syndrome    Chronic bilateral low back pain     Benign essential HTN    Tobacco abuse    Hyperlipidemia    History of CVA with residual deficit    Dysphagia, post-stroke    ICH (intracerebral hemorrhage) ([place]) - R thalmic/PLIC d/t HTN [id number]   Insomnia 07/13/2017   PAD (peripheral artery disease) ([place]) 07/13/2017   Hyperlipidemia with target LDL less than 70 07/13/2017   Stroke-like symptom 09/22/2016   Paresthesia 09/22/2016   CVA (cerebral vascular accident) ([place]) 09/22/2016   Cerebrovascular accident (CVA) due to thrombosis of left carotid artery ([place]) 09/23/2015   Primary snoring 09/23/2015   Hypersomnia with sleep apnea 09/23/2015   Obstructive sleep apnea 09/23/2015   Lacunar infarct, acute ([place]) 08/25/2015   Sleep apnea 08/25/2015   Dysarthria    CVD (cardiovascular disease) 08/02/2015   Acute hyperglycemia 0000000   Systolic hypertension with cerebrovascular disease 12/27/2014   Epistaxis 07/28/2012   NEPHROLITHIASIS 05/23/2009   BARRETTS ESOPHAGUS 02/20/2009   Gastroparesis 02/20/2009   RADICULOPATHY 10/21/2008   GERD 10/08/2008   ESOPHAGITIS 08/15/2008   ESOPHAGEAL STRICTURE 08/15/2008   GASTRITIS, ACUTE 08/15/2008   RENAL CYST 08/14/2008   TOBACCO ABUSE 08/08/2008   HEMATURIA, MICROSCOPIC, HX OF 08/08/2008   PULMONARY NODULE 01/10/2008  PCP: Dr Betty Martinique   REFERRING PROVIDER: Dr Betty Martinique   REFERRING DIAG:  Diagnosis  R29.6 (ICD-10-CM) - Frequent falls    THERAPY DIAG:  Repeated falls  Muscle weakness (generalized)  Other low back pain  Rationale for Evaluation and Treatment: Rehabilitation  ONSET DATE: increasing number of falls over the past few weeks  SUBJECTIVE:   SUBJECTIVE STATEMENT:  Pt reports he did well after last land session.  He has had no falls or near falls since last visit. His wife is present on deck during session.   Eval: The patient has a long history of balance disturbances following a stroke in 2016. He reports over the past several months the balance  deficits have increased. He has had 4 major falls. 3 of which were falling backwards. The other fall was from tripping over an object. He is using a cane right now 2nd to decreased ability to use his left arm.   PERTINENT HISTORY: Depression, chronic low back pain, Lumbar surgery 2005 , 2010 ; RTC repair 2021; CVA, HTN, PNA, Stroke 2016. Stroke effected Left UE,   PAIN:  Are you having pain? Yes: NPRS scale: 5/10 Pain location: middle of his lower back  Pain description: aching  Aggravating factors: bending  Relieving factors: rest   PRECAUTIONS: Fall  WEIGHT BEARING RESTRICTIONS: No  FALLS:  Has patient fallen in last 6 months? Yes. 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 a week  PLOF: Independent with use of a cane   PATIENT GOALS:  To improve balance   OBJECTIVE:   FOTO 42 pts 2/6   BERG Balance Test                                              Date: 12/21/22   Sit to Stand 3  Standing unsupported 3  Sitting with back unsupported but feet supported 4  Stand to sit  2  Transfers  3  Standing unsupported with eyes closed 4  Standing unsupported feet together 3  From standing position, reach forward with outstretched arm 3  From standing position, pick up object from floor 4  From standing position, turn and look behind over each shoulder 4  Turn 360 3  Standing unsupported, alternately place foot on step 0  Standing unsupported, one foot in front 3  Standing on one leg 0  Total:  39/56     5xSTS: 44s with UE assist  STS Transfers: improved fwd trunk lean during transition, reduction of posterior LOB, able to come to full standing independently   TODAY'S TREATMENT:  DATE:   12/30/22: Pt seen for aquatic therapy today.  Treatment took  place in water 3.2-4.75 ft in depth at the [facility] pool. Temp of water was 91.  Pt entered/exited the pool via stairs with bilat rail with supervision.  Therapist on deck entire session.    -With RUE support on barbell:  forward gait, backward gait, side stepping, marching forward -Holding wall with BUE: hip ext to toe touch x 10 each LE; L stretch for low back -RUE on wall:  Lt step forward over diving brick and reverse step over brick to return to neutral x 8; Lt side step over diving brick, and return to start -With light Rt hand support on wall:  SLS  (R/L) x 15-20s x 2 -Standard stance wth "turn and look behind" x 3 -STS at bench in water x 5; repeated with side step Rt, moving down bench after each STS (x6) -Lt Toe taps to 1st step in water with intermittent Rt hand on rail to steady, repeated with Rt -L stretch for lower back, holding bilat rails  Pt requires the buoyancy and hydrostatic pressure of water for support, and to offload joints by unweighting joint load by at least 50 % in navel deep water and by at least 75-80% in chest to neck deep water.  Viscosity of the water is needed for resistance of strengthening. Water current perturbations provides challenge to standing balance requiring increased core activation.  2/6 Nu step Lvl 9 warm up 6 min UE and LE  Staggered stance squat on airex 2x8 NBOS reach with UE head hips on airex 2x20 Resisted STS with focus on eccentric lower (YTB at waist) 3x5  Stand fwd march 2x20 Minisquat at bar with Ue support 3x8 Lateral march at table 3x laps  Edu about safe home hip flexion exercise in seated and standing     12/23/22: Pt seen for aquatic therapy today.  Treatment took place in water 3.25-4.5 ft in depth at the [facility]. Temp of water was 91.  Pt entered/exited the pool via stairs with bilat rail with SBA.  Therapist on deck entire session.   Holding wall: side stepping R/L With light Rt hand support  on wall:  SLS  (R/L) x 20s x 2 Without support:  forward gait, backward gait, side stepping  Holding wall with RUE:  LLE kicks front/ side x 10 each hip hinge with forward arm reach (RUE on barbell)- hip bump to wall and hip thrust forward to return to neutral Standard stance wth "turn and look behind" x 3 Holding barbell with RUE, high knee marching in place and then forward Alternating Toe taps to step in water with intermittent Rt hand on wall to steady STS at bench without UE support x 10 Pt requires the buoyancy and hydrostatic pressure of water for support, and to offload joints by unweighting joint load by at least 50 % in navel deep water and by at least 75-80% in chest to neck deep water.  Viscosity of the water is needed for resistance of strengthening. Water current perturbations provides challenge to standing balance requiring increased core activation.    2/6 Stand fwd march 2x20  NBOS "turn around/look" on flat surface 2x10 each way NBOS "turn around/look" semi tandem on Airex 2x10 (R sided LOB)  55f x4 laps Med and lateral gait perturbations; [id number] laps with AP perturbations (working on reactive stepping, increasing BOS width/stance) Fwd high step [id number] PATIENT EDUCATION:  Education details:  aquatic progressions  Person educated: Patient Education method: Explanation, Demonstration, Tactile cues, Verbal cues Education comprehension: verbalized understanding, returned demonstration, verbal cues required, tactile cues required, and needs further education  HOME EXERCISE PROGRAM: Verbally discussed; gym machine usage and current walking program  ASSESSMENT:  CLINICAL IMPRESSION: Pt session focused today on balance and obstacle navigation. Pt required light RUE support on wall to steady when stepping backwards over brick in pool. Initially he had difficulty with task, but was able to improve fluidity of motion with increased reps. Discussed with pt and his wife  regarding her joining him in the pool next session to assist in transition to more independent aquatic exercise at d/c.  Continue with balance and LE strength. Pt would benefit from continued skilled therapy in order to reach goals and maximize functional LE strength and balance for prevention of future falls and further functional decline.   OBJECTIVE IMPAIRMENTS: Abnormal gait, decreased activity tolerance, decreased knowledge of use of DME, decreased mobility, difficulty walking, decreased ROM, decreased strength, decreased safety awareness, increased fascial restrictions, increased muscle spasms, impaired flexibility, postural dysfunction, and pain.   ACTIVITY LIMITATIONS: carrying, lifting, bending, standing, squatting, stairs, transfers, bed mobility, reach over head, and locomotion level  PARTICIPATION LIMITATIONS: meal prep, cleaning, laundry, driving, shopping, community activity, and yard work  PERSONAL FACTORS: 3+ comorbidities: Low back Surgery, frequent falls; Past CVA   are also affecting patient's functional outcome.   REHAB POTENTIAL: Good  CLINICAL DECISION MAKING: Evolving/moderate complexity increasing falls  EVALUATION COMPLEXITY: Moderate   GOALS: Goals reviewed with patient? Yes  SHORT TERM GOALS: Target date: 12/10/2022   Patient will decrease 5x sit to stand time by 10 seconds and before all 5 reps.  Baseline: Goal status: ongoing; able to complete 5  2.  Patient will increase left quad and glut strength by 5 lbs  Baseline:  Goal status: Not tested today  3.  Patient will transfer sit to stand with SBA and decreased use of his hands  Baseline:  Goal status: met   LONG TERM GOALS: Target date: 03/21/23    Patient will have no major falls over a month  Baseline:  Goal status: ongoing  2.  Patients will score > 42 on the BERG to show a decreased fall risk  Baseline:  Goal status: ongoing  3.  Patient will return to a full gym program  Baseline:   Goal status: ongoing   PLAN:  PT FREQUENCY: 2x/week  PT DURATION: 12 wks (aim for D/C in 8)  PLANNED INTERVENTIONS: Therapeutic exercises, Therapeutic activity, Neuromuscular re-education, Balance training, Gait training, Patient/Family education, Self Care, Joint mobilization, Stair training, DME instructions, Aquatic Therapy, Spinal mobilization, Cryotherapy, Moist heat, Ultrasound, and Manual therapy  PLAN FOR NEXT SESSION:  In water work on posterior balance and lifting legs high. Work on the same things on land.  Add more Berg items to the exercises completed in water.   Kerin Perna, PTA 12/30/22 5:45 PM [facility] [address] [place], [place], [address] Phone: [phone]   Fax:  [phone]

## 2023-01-04 ENCOUNTER — Encounter (HOSPITAL_BASED_OUTPATIENT_CLINIC_OR_DEPARTMENT_OTHER): Payer: Self-pay | Admitting: Physical Therapy

## 2023-01-04 ENCOUNTER — Ambulatory Visit (HOSPITAL_BASED_OUTPATIENT_CLINIC_OR_DEPARTMENT_OTHER): Payer: Medicare Other | Admitting: Physical Therapy

## 2023-01-04 DIAGNOSIS — M6281 Muscle weakness (generalized): Secondary | ICD-10-CM

## 2023-01-04 DIAGNOSIS — R296 Repeated falls: Secondary | ICD-10-CM | POA: Diagnosis not present

## 2023-01-04 DIAGNOSIS — M5459 Other low back pain: Secondary | ICD-10-CM | POA: Diagnosis not present

## 2023-01-04 NOTE — Therapy (Signed)
OUTPATIENT PHYSICAL THERAPY LOWER EXTREMITY TREATMENT    Patient Name: Richard Vaughn MRN: [id number] DOB:[birth year], 72 y.o., male Today's Date: 01/04/2023  END OF SESSION:  PT End of Session - 01/04/23 1434     Visit Number 14    Number of Visits 21    Date for PT Re-Evaluation 03/21/23    Authorization Type MCR    PT Start Time 1433    PT Stop Time [id number]    PT Time Calculation (min) 42 min    Behavior During Therapy WFL for tasks assessed/performed                 Past Medical History:  Diagnosis Date   Barrett's esophagus    Chronic back pain    Depression    Gastritis    Mild   Gastroparesis    GERD (gastroesophageal reflux disease)    Headache    History of kidney stones    Hyperglycemia    Hypertension    Nephrolithiasis    Pneumonia    Covid pneumonia   Renal cyst    Sleep apnea    Stricture and stenosis of esophagus    Stroke ([place]) 07/2015   Vision abnormalities    Past Surgical History:  Procedure Laterality Date   CERVICAL [place] SURGERY  2012   COLONOSCOPY     LUMBAR [place] SURGERY  2005, 2010   replaced L4 and L5   SHOULDER ARTHROSCOPY WITH ROTATOR CUFF REPAIR Left 08/14/2020   Procedure: SHOULDER ARTHROSCOPY WITH ROTATOR CUFF REPAIR, SUBACROMIAL DECOMPRESSION;  Surgeon: Tania Ade, MD;  Location: [facility];  Service: Orthopedics;  Laterality: Left;   UPPER GASTROINTESTINAL ENDOSCOPY     Patient Active Problem List   Diagnosis Date Noted   Drug-induced constipation 06/08/2022   Hemiparesis affecting left side as late effect of cerebrovascular accident ([place]) 12/09/2021   Depression, major, recurrent, mild ([place])    Sleep disturbance    Urinary retention    Bradycardia    Essential hypertension    Temperature elevated    Left thyroid nodule    Treatment-emergent central sleep apnea 10/25/2018   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) 10/25/2018   Chronic pain syndrome    Chronic bilateral low back pain     Benign essential HTN    Tobacco abuse    Hyperlipidemia    History of CVA with residual deficit    Dysphagia, post-stroke    ICH (intracerebral hemorrhage) ([place]) - R thalmic/PLIC d/t HTN [id number]   Insomnia 07/13/2017   PAD (peripheral artery disease) ([place]) 07/13/2017   Hyperlipidemia with target LDL less than 70 07/13/2017   Stroke-like symptom 09/22/2016   Paresthesia 09/22/2016   CVA (cerebral vascular accident) ([place]) 09/22/2016   Cerebrovascular accident (CVA) due to thrombosis of left carotid artery ([place]) 09/23/2015   Primary snoring 09/23/2015   Hypersomnia with sleep apnea 09/23/2015   Obstructive sleep apnea 09/23/2015   Lacunar infarct, acute ([place]) 08/25/2015   Sleep apnea 08/25/2015   Dysarthria    CVD (cardiovascular disease) 08/02/2015   Acute hyperglycemia 0000000   Systolic hypertension with cerebrovascular disease 12/27/2014   Epistaxis 07/28/2012   NEPHROLITHIASIS 05/23/2009   BARRETTS ESOPHAGUS 02/20/2009   Gastroparesis 02/20/2009   RADICULOPATHY 10/21/2008   GERD 10/08/2008   ESOPHAGITIS 08/15/2008   ESOPHAGEAL STRICTURE 08/15/2008   GASTRITIS, ACUTE 08/15/2008   RENAL CYST 08/14/2008   TOBACCO ABUSE 08/08/2008   HEMATURIA, MICROSCOPIC, HX OF 08/08/2008   PULMONARY NODULE 01/10/2008  PCP: Dr Betty Martinique   REFERRING PROVIDER: Dr Betty Martinique   REFERRING DIAG:  Diagnosis  R29.6 (ICD-10-CM) - Frequent falls    THERAPY DIAG:  Muscle weakness (generalized)  Repeated falls  Other low back pain  Rationale for Evaluation and Treatment: Rehabilitation  ONSET DATE: increasing number of falls over the past few weeks  SUBJECTIVE:   SUBJECTIVE STATEMENT:  Pt states he has had no falls but a couple "scares" while walking up/down hills on his normal daily walks.   Eval: The patient has a long history of balance disturbances following a stroke in 2016. He reports over the past several months the balance deficits have increased. He has had 4  major falls. 3 of which were falling backwards. The other fall was from tripping over an object. He is using a cane right now 2nd to decreased ability to use his left arm.   PERTINENT HISTORY: Depression, chronic low back pain, Lumbar surgery 2005 , 2010 ; RTC repair 2021; CVA, HTN, PNA, Stroke 2016. Stroke effected Left UE,   PAIN:  Are you having pain? Yes: NPRS scale: 5/10 Pain location: middle of his lower back  Pain description: aching  Aggravating factors: bending  Relieving factors: rest   PRECAUTIONS: Fall  WEIGHT BEARING RESTRICTIONS: No  FALLS:  Has patient fallen in last 6 months? Yes. 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 a week  PLOF: Independent with use of a cane   PATIENT GOALS:  To improve balance   OBJECTIVE:   FOTO 42 pts 2/6   BERG Balance Test                                              Date: 12/21/22   Sit to Stand 3  Standing unsupported 3  Sitting with back unsupported but feet supported 4  Stand to sit  2  Transfers  3  Standing unsupported with eyes closed 4  Standing unsupported feet together 3  From standing position, reach forward with outstretched arm 3  From standing position, pick up object from floor 4  From standing position, turn and look behind over each shoulder 4  Turn 360 3  Standing unsupported, alternately place foot on step 0  Standing unsupported, one foot in front 3  Standing on one leg 0  Total:  39/56     5xSTS: 44s with UE assist  STS Transfers: improved fwd trunk lean during transition, reduction of posterior LOB, able to come to full standing independently   TODAY'S TREATMENT:  DATE:    2/20  Nu step Lvl 9 warm up 6 min UE and LE  Marching on airex 30 marches x3 (4x LOB) Wobble  board 2x20 each ML and AP taps Staggered stance squat on airex 2x8 Resisted STS with focus on eccentric lower (YTB at waist) 3x5  Standing HR/TR with UE support 20x  12/30/22: Pt seen for aquatic therapy today.  Treatment took place in water 3.2-4.75 ft in depth at the [facility] pool. Temp of water was 91.  Pt entered/exited the pool via stairs with bilat rail with supervision.  Therapist on deck entire session.    -With RUE support on barbell:  forward gait, backward gait, side stepping, marching forward -Holding wall with BUE: hip ext to toe touch x 10 each LE; L stretch for low back -RUE on wall:  Lt step forward over diving brick and reverse step over brick to return to neutral x 8; Lt side step over diving brick, and return to start -With light Rt hand support on wall:  SLS  (R/L) x 15-20s x 2 -Standard stance wth "turn and look behind" x 3 -STS at bench in water x 5; repeated with side step Rt, moving down bench after each STS (x6) -Lt Toe taps to 1st step in water with intermittent Rt hand on rail to steady, repeated with Rt -L stretch for lower back, holding bilat rails  Pt requires the buoyancy and hydrostatic pressure of water for support, and to offload joints by unweighting joint load by at least 50 % in navel deep water and by at least 75-80% in chest to neck deep water.  Viscosity of the water is needed for resistance of strengthening. Water current perturbations provides challenge to standing balance requiring increased core activation.  2/6 Nu step Lvl 9 warm up 6 min UE and LE  Staggered stance squat on airex 2x8 NBOS reach with UE head hips on airex 2x20 Resisted STS with focus on eccentric lower (YTB at waist) 3x5  Stand fwd march 2x20 Minisquat at bar with Ue support 3x8 Lateral march at table 3x laps  Edu about safe home hip flexion exercise in seated and standing     12/23/22: Pt seen for aquatic therapy today.  Treatment took place in water 3.25-4.5  ft in depth at the [facility]. Temp of water was 91.  Pt entered/exited the pool via stairs with bilat rail with SBA.  Therapist on deck entire session.   Holding wall: side stepping R/L With light Rt hand support on wall:  SLS  (R/L) x 20s x 2 Without support:  forward gait, backward gait, side stepping  Holding wall with RUE:  LLE kicks front/ side x 10 each hip hinge with forward arm reach (RUE on barbell)- hip bump to wall and hip thrust forward to return to neutral Standard stance wth "turn and look behind" x 3 Holding barbell with RUE, high knee marching in place and then forward Alternating Toe taps to step in water with intermittent Rt hand on wall to steady STS at bench without UE support x 10 Pt requires the buoyancy and hydrostatic pressure of water for support, and to offload joints by unweighting joint load by at least 50 % in navel deep water and by at least 75-80% in chest to neck deep water.  Viscosity of the water is needed for resistance of strengthening. Water current perturbations provides challenge to standing balance requiring increased core activation.    2/6 Stand  fwd march 2x20  NBOS "turn around/look" on flat surface 2x10 each way NBOS "turn around/look" semi tandem on Airex 2x10 (R sided LOB)  57f x4 laps Med and lateral gait perturbations; [id number] laps with AP perturbations (working on reactive stepping, increasing BOS width/stance) Fwd high step [id number] PATIENT EDUCATION:  Education details: aquatic progressions  Person educated: Patient Education method: ExpConsulting civil engineeremonstration, [employer] treasureres, Verbal cues Education comprehension: verbalized understanding, returned demonstration, verbal cues required, tactile cues required, and needs further education  HOME EXERCISE PROGRAM: Verbally discussed; gym machine usage and current walking program  ASSESSMENT:  CLINICAL IMPRESSION: Pt with increased apprehension with increase in degrees of  freedom with intro of wobble board. Pt's fear of falling likely contributing to "freezing" during daily community ambulation. Pt advised that making mistakes and "failing" in a safe environment in addition to the stress inoculation will likely be what improves the apprehension with hill walking. As weather allows, plan for outdoor obstacle course or studio obstacle course in order to manage sloping surfaces with supervision and assist. Pt would benefit from continued skilled therapy in order to reach goals and maximize functional LE strength and balance for prevention of future falls and further functional decline.   OBJECTIVE IMPAIRMENTS: Abnormal gait, decreased activity tolerance, decreased knowledge of use of DME, decreased mobility, difficulty walking, decreased ROM, decreased strength, decreased safety awareness, increased fascial restrictions, increased muscle spasms, impaired flexibility, postural dysfunction, and pain.   ACTIVITY LIMITATIONS: carrying, lifting, bending, standing, squatting, stairs, transfers, bed mobility, reach over head, and locomotion level  PARTICIPATION LIMITATIONS: meal prep, cleaning, laundry, driving, shopping, community activity, and yard work  PERSONAL FACTORS: 3+ comorbidities: Low back Surgery, frequent falls; Past CVA   are also affecting patient's functional outcome.   REHAB POTENTIAL: Good  CLINICAL DECISION MAKING: Evolving/moderate complexity increasing falls  EVALUATION COMPLEXITY: Moderate   GOALS: Goals reviewed with patient? Yes  SHORT TERM GOALS: Target date: 12/10/2022   Patient will decrease 5x sit to stand time by 10 seconds and before all 5 reps.  Baseline: Goal status: ongoing; able to complete 5  2.  Patient will increase left quad and glut strength by 5 lbs  Baseline:  Goal status: Not tested today  3.  Patient will transfer sit to stand with SBA and decreased use of his hands  Baseline:  Goal status: met   LONG TERM GOALS:  Target date: 03/21/23    Patient will have no major falls over a month  Baseline:  Goal status: ongoing  2.  Patients will score > 42 on the BERG to show a decreased fall risk  Baseline:  Goal status: ongoing  3.  Patient will return to a full gym program  Baseline:  Goal status: ongoing   PLAN:  PT FREQUENCY: 2x/week  PT DURATION: 12 wks (aim for D/C in 8)  PLANNED INTERVENTIONS: Therapeutic exercises, Therapeutic activity, Neuromuscular re-education, Balance training, Gait training, Patient/Family education, Self Care, Joint mobilization, Stair training, DME instructions, Aquatic Therapy, Spinal mobilization, Cryotherapy, Moist heat, Ultrasound, and Manual therapy  PLAN FOR NEXT SESSION:  In water work on posterior balance and lifting legs high. Work on the same things on land.  Add more Berg items to the exercises completed in water.   AlaDaleen Bo, DPT 01/04/23 3:18 PM

## 2023-01-05 DIAGNOSIS — N5201 Erectile dysfunction due to arterial insufficiency: Secondary | ICD-10-CM | POA: Diagnosis not present

## 2023-01-05 DIAGNOSIS — N401 Enlarged prostate with lower urinary tract symptoms: Secondary | ICD-10-CM | POA: Diagnosis not present

## 2023-01-05 DIAGNOSIS — R351 Nocturia: Secondary | ICD-10-CM | POA: Diagnosis not present

## 2023-01-06 ENCOUNTER — Ambulatory Visit (HOSPITAL_BASED_OUTPATIENT_CLINIC_OR_DEPARTMENT_OTHER): Payer: Medicare Other | Admitting: Physical Therapy

## 2023-01-06 ENCOUNTER — Encounter (HOSPITAL_BASED_OUTPATIENT_CLINIC_OR_DEPARTMENT_OTHER): Payer: Self-pay | Admitting: Physical Therapy

## 2023-01-06 DIAGNOSIS — R296 Repeated falls: Secondary | ICD-10-CM | POA: Diagnosis not present

## 2023-01-06 DIAGNOSIS — M5459 Other low back pain: Secondary | ICD-10-CM

## 2023-01-06 DIAGNOSIS — M6281 Muscle weakness (generalized): Secondary | ICD-10-CM

## 2023-01-06 NOTE — Therapy (Signed)
OUTPATIENT PHYSICAL THERAPY LOWER EXTREMITY TREATMENT    Patient Name: Richard Vaughn MRN: [id number] DOB:Jan 16, 1951, 72 y.o., male Today's Date: 01/06/2023  END OF SESSION:  PT End of Session - 01/06/23 1554     Visit Number 15    Number of Visits 21    Date for PT Re-Evaluation 03/21/23    Authorization Type MCR    PT Start Time 1539   pt arrived late to pool area   PT Stop Time 1618    PT Time Calculation (min) 39 min    Activity Tolerance Patient tolerated treatment well    Behavior During Therapy [facility] for tasks assessed/performed                 Past Medical History:  Diagnosis Date   Barrett's esophagus    Chronic back pain    Depression    Gastritis    Mild   Gastroparesis    GERD (gastroesophageal reflux disease)    Headache    History of kidney stones    Hyperglycemia    Hypertension    Nephrolithiasis    Pneumonia    Covid pneumonia   Renal cyst    Sleep apnea    Stricture and stenosis of esophagus    Stroke ([place]) 07/2015   Vision abnormalities    Past Surgical History:  Procedure Laterality Date   CERVICAL [place] SURGERY  2012   COLONOSCOPY     LUMBAR [place] SURGERY  2005, 2010   replaced L4 and L5   SHOULDER ARTHROSCOPY WITH ROTATOR CUFF REPAIR Left 08/14/2020   Procedure: SHOULDER ARTHROSCOPY WITH ROTATOR CUFF REPAIR, SUBACROMIAL DECOMPRESSION;  Surgeon: Tania Ade, MD;  Location: [facility];  Service: Orthopedics;  Laterality: Left;   UPPER GASTROINTESTINAL ENDOSCOPY     Patient Active Problem List   Diagnosis Date Noted   Drug-induced constipation 06/08/2022   Hemiparesis affecting left side as late effect of cerebrovascular accident ([place]) 12/09/2021   Depression, major, recurrent, mild ([place])    Sleep disturbance    Urinary retention    Bradycardia    Essential hypertension    Temperature elevated    Left thyroid nodule    Treatment-emergent central sleep apnea 10/25/2018   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  (CVA) 10/25/2018   Chronic pain syndrome    Chronic bilateral low back pain    Benign essential HTN    Tobacco abuse    Hyperlipidemia    History of CVA with residual deficit    Dysphagia, post-stroke    ICH (intracerebral hemorrhage) ([place]) - R thalmic/PLIC d/t HTN 123456   Insomnia 07/13/2017   PAD (peripheral artery disease) ([place]) 07/13/2017   Hyperlipidemia with target LDL less than 70 07/13/2017   Stroke-like symptom 09/22/2016   Paresthesia 09/22/2016   CVA (cerebral vascular accident) ([place]) 09/22/2016   Cerebrovascular accident (CVA) due to thrombosis of left carotid artery ([place]) 09/23/2015   Primary snoring 09/23/2015   Hypersomnia with sleep apnea 09/23/2015   Obstructive sleep apnea 09/23/2015   Lacunar infarct, acute ([place]) 08/25/2015   Sleep apnea 08/25/2015   Dysarthria    CVD (cardiovascular disease) 08/02/2015   Acute hyperglycemia 0000000   Systolic hypertension with cerebrovascular disease 12/27/2014   Epistaxis 07/28/2012   NEPHROLITHIASIS 05/23/2009   BARRETTS ESOPHAGUS 02/20/2009   Gastroparesis 02/20/2009   RADICULOPATHY 10/21/2008   GERD 10/08/2008   ESOPHAGITIS 08/15/2008   ESOPHAGEAL STRICTURE 08/15/2008   GASTRITIS, ACUTE 08/15/2008   RENAL CYST 08/14/2008   TOBACCO ABUSE  08/08/2008   HEMATURIA, MICROSCOPIC, HX OF 08/08/2008   PULMONARY NODULE 01/10/2008    PCP: Dr Betty Martinique   REFERRING PROVIDER: Dr Betty Martinique   REFERRING DIAG:  Diagnosis  R29.6 (ICD-10-CM) - Frequent falls    THERAPY DIAG:  Muscle weakness (generalized)  Repeated falls  Other low back pain  Rationale for Evaluation and Treatment: Rehabilitation  ONSET DATE: increasing number of falls over the past few weeks  SUBJECTIVE:   SUBJECTIVE STATEMENT:  Pt states he has had no falls.  His back, "feels about the same".  His wife states she is under the weather, so she said she will get in pool with him next time.    Eval: The patient has a long history of  balance disturbances following a stroke in 2016. He reports over the past several months the balance deficits have increased. He has had 4 major falls. 3 of which were falling backwards. The other fall was from tripping over an object. He is using a cane right now 2nd to decreased ability to use his left arm.   PERTINENT HISTORY: Depression, chronic low back pain, Lumbar surgery 2005 , 2010 ; RTC repair 2021; CVA, HTN, PNA, Stroke 2016. Stroke effected Left UE,   PAIN:  Are you having pain? Yes: NPRS scale: 5/10 Pain location: middle of his lower back  Pain description: aching  Aggravating factors: bending  Relieving factors: rest   PRECAUTIONS: Fall  WEIGHT BEARING RESTRICTIONS: No  FALLS:  Has patient fallen in last 6 months? Yes. 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 a week  PLOF: Independent with use of a cane   PATIENT GOALS:  To improve balance   OBJECTIVE:   FOTO 42 pts 2/6   BERG Balance Test                                              Date: 12/21/22   Sit to Stand 3  Standing unsupported 3  Sitting with back unsupported but feet supported 4  Stand to sit  2  Transfers  3  Standing unsupported with eyes closed 4  Standing unsupported feet together 3  From standing position, reach forward with outstretched arm 3  From standing position, pick up object from floor 4  From standing position, turn and look behind over each shoulder 4  Turn 360 3  Standing unsupported, alternately place foot on step 0  Standing unsupported, one foot in front 3  Standing on one leg 0  Total:  39/56     5xSTS: 44s with UE assist  STS Transfers: improved fwd trunk lean during transition, reduction of posterior LOB, able to come to full standing independently   TODAY'S TREATMENT:  DATE:  01/06/23: Pt seen for aquatic therapy today.  Treatment took place in water 3.2-4.75 ft in depth at the [facility] pool. Temp of water was 91.  Pt entered/exited the pool via stairs with bilat rail with supervision.  Therapist on deck entire session.   -With RUE support on barbell:  forward gait -> no support, continued forward gait with cues for narrow BOS - without support backward gait, side stepping, marching forward - Rt SLS x 10s (no support), Lt SLS with 2 finger support x 10s, without support - up to 2-4s -Holding wall with BUE (at drain): L stretch for low back; hip abdct 2 x10;  hip ext to toe touch x 10 each LE; - side to side lunge (difficult), weight shift to R/L legs and side reach L/R with hands laced, outside base of support (challenging) -STS at bench in water with side step Rt/Lt , moving down bench after each STS (x8 each direction), cues for R arm reach for improved forward weight shift -alternating Toe taps to 1st step in water without support  Pt requires the buoyancy and hydrostatic pressure of water for support, and to offload joints by unweighting joint load by at least 50 % in navel deep water and by at least 75-80% in chest to neck deep water.  Viscosity of the water is needed for resistance of strengthening. Water current perturbations provides challenge to standing balance requiring increased core activation.  2/20  Nu step Lvl 9 warm up 6 min UE and LE  Marching on airex 30 marches x3 (4x LOB) Wobble board 2x20 each ML and AP taps Staggered stance squat on airex 2x8 Resisted STS with focus on eccentric lower (YTB at waist) 3x5  Standing HR/TR with UE support 20x  12/30/22: Pt seen for aquatic therapy today.  Treatment took place in water 3.2-4.75 ft in depth at the [facility] pool. Temp of water was 91.  Pt entered/exited the pool via stairs with bilat rail with supervision.  Therapist on deck entire  session.    -With RUE support on barbell:  forward gait, backward gait, side stepping, marching forward -Holding wall with BUE: hip ext to toe touch x 10 each LE; L stretch for low back -RUE on wall:  Lt step forward over diving brick and reverse step over brick to return to neutral x 8; Lt side step over diving brick, and return to start -With light Rt hand support on wall:  SLS  (R/L) x 15-20s x 2 -Standard stance wth "turn and look behind" x 3 -STS at bench in water x 5; repeated with side step Rt, moving down bench after each STS (x6) -Lt Toe taps to 1st step in water with intermittent Rt hand on rail to steady, repeated with Rt -L stretch for lower back, holding bilat rails  Pt requires the buoyancy and hydrostatic pressure of water for support, and to offload joints by unweighting joint load by at least 50 % in navel deep water and by at least 75-80% in chest to neck deep water.  Viscosity of the water is needed for resistance of strengthening. Water current perturbations provides challenge to standing balance requiring increased core activation.  2/6 Nu step Lvl 9 warm up 6 min UE and LE  Staggered stance squat on airex 2x8 NBOS reach with UE head hips on airex 2x20 Resisted STS with focus on eccentric lower (YTB at waist) 3x5  Stand fwd march 2x20 Minisquat at bar with Ue support  3x8 Lateral march at table 3x laps  Edu about safe home hip flexion exercise in seated and standing     12/23/22: Pt seen for aquatic therapy today.  Treatment took place in water 3.25-4.5 ft in depth at the [facility]. Temp of water was 91.  Pt entered/exited the pool via stairs with bilat rail with SBA.  Therapist on deck entire session.   Holding wall: side stepping R/L With light Rt hand support on wall:  SLS  (R/L) x 20s x 2 Without support:  forward gait, backward gait, side stepping  Holding wall with RUE:  LLE kicks front/ side x 10 each hip hinge with forward arm reach (RUE on  barbell)- hip bump to wall and hip thrust forward to return to neutral Standard stance wth "turn and look behind" x 3 Holding barbell with RUE, high knee marching in place and then forward Alternating Toe taps to step in water with intermittent Rt hand on wall to steady STS at bench without UE support x 10 Pt requires the buoyancy and hydrostatic pressure of water for support, and to offload joints by unweighting joint load by at least 50 % in navel deep water and by at least 75-80% in chest to neck deep water.  Viscosity of the water is needed for resistance of strengthening. Water current perturbations provides challenge to standing balance requiring increased core activation.    2/6 Stand fwd march 2x20  NBOS "turn around/look" on flat surface 2x10 each way NBOS "turn around/look" semi tandem on Airex 2x10 (R sided LOB)  95f x4 laps Med and lateral gait perturbations; 38f4x laps with AP perturbations (working on reactive stepping, increasing BOS width/stance) Fwd high step [id number] PATIENT EDUCATION:  Education details: aquatic progressions  Person educated: Patient Education method: ExpConsulting civil engineeremonstration, Tactile cues, Verbal cues Education comprehension: verbalized understanding, returned demonstration, verbal cues required, tactile cues required, and needs further education  HOME EXERCISE PROGRAM: Verbally discussed; gym machine usage and current walking program  ASSESSMENT:  CLINICAL IMPRESSION: Pt able to walk with less freezing moments during gait without UE support in water.  Improved form with sit to stand with use of Rt forward arm reach to achieve forward weight shift prior to ascending.  Pt reminded that the pool is a safe environment to work on balance exercises. Plan to instruct pt's wife on ways to assist/challenge pt in water on next visit.  Pt would benefit from continued skilled therapy in order to reach goals and maximize functional LE strength and balance  for prevention of future falls and further functional decline.   OBJECTIVE IMPAIRMENTS: Abnormal gait, decreased activity tolerance, decreased knowledge of use of DME, decreased mobility, difficulty walking, decreased ROM, decreased strength, decreased safety awareness, increased fascial restrictions, increased muscle spasms, impaired flexibility, postural dysfunction, and pain.   ACTIVITY LIMITATIONS: carrying, lifting, bending, standing, squatting, stairs, transfers, bed mobility, reach over head, and locomotion level  PARTICIPATION LIMITATIONS: meal prep, cleaning, laundry, driving, shopping, community activity, and yard work  PERSONAL FACTORS: 3+ comorbidities: Low back Surgery, frequent falls; Past CVA   are also affecting patient's functional outcome.   REHAB POTENTIAL: Good  CLINICAL DECISION MAKING: Evolving/moderate complexity increasing falls  EVALUATION COMPLEXITY: Moderate   GOALS: Goals reviewed with patient? Yes  SHORT TERM GOALS: Target date: 12/10/2022   Patient will decrease 5x sit to stand time by 10 seconds and before all 5 reps.  Baseline: Goal status: ongoing; able to complete 5  2.  Patient  will increase left quad and glut strength by 5 lbs  Baseline:  Goal status: Not tested today  3.  Patient will transfer sit to stand with SBA and decreased use of his hands  Baseline:  Goal status: met   LONG TERM GOALS: Target date: 03/21/23    Patient will have no major falls over a month  Baseline:  Goal status: ongoing  2.  Patients will score > 42 on the BERG to show a decreased fall risk  Baseline:  Goal status: ongoing  3.  Patient will return to a full gym program  Baseline:  Goal status: ongoing   PLAN:  PT FREQUENCY: 2x/week  PT DURATION: 12 wks (aim for D/C in 8)  PLANNED INTERVENTIONS: Therapeutic exercises, Therapeutic activity, Neuromuscular re-education, Balance training, Gait training, Patient/Family education, Self Care, Joint  mobilization, Stair training, DME instructions, Aquatic Therapy, Spinal mobilization, Cryotherapy, Moist heat, Ultrasound, and Manual therapy  PLAN FOR NEXT SESSION:  In water work on posterior balance and lifting legs high. Work on the same things on land.  Add more Berg items to the exercises completed in water.   Kerin Perna, PTA 01/06/23 4:53 PM [facility] [address] [place], [place], [address] Phone: [phone]   Fax:  [phone]

## 2023-01-10 ENCOUNTER — Encounter (HOSPITAL_BASED_OUTPATIENT_CLINIC_OR_DEPARTMENT_OTHER): Payer: Self-pay | Admitting: Physical Therapy

## 2023-01-10 ENCOUNTER — Ambulatory Visit (HOSPITAL_BASED_OUTPATIENT_CLINIC_OR_DEPARTMENT_OTHER): Payer: Medicare Other | Admitting: Physical Therapy

## 2023-01-10 DIAGNOSIS — M6281 Muscle weakness (generalized): Secondary | ICD-10-CM | POA: Diagnosis not present

## 2023-01-10 DIAGNOSIS — M5459 Other low back pain: Secondary | ICD-10-CM

## 2023-01-10 DIAGNOSIS — R296 Repeated falls: Secondary | ICD-10-CM | POA: Diagnosis not present

## 2023-01-10 NOTE — Therapy (Signed)
OUTPATIENT PHYSICAL THERAPY LOWER EXTREMITY TREATMENT    Patient Name: Richard Vaughn MRN: [id number] DOB:12-05-1950, 72 y.o., male Today's Date: 01/10/2023  END OF SESSION:  PT End of Session - 01/10/23 0929     Visit Number 16    Number of Visits 21    Date for PT Re-Evaluation 03/21/23    Authorization Type MCR    PT Start Time 0849    PT Stop Time 0927    PT Time Calculation (min) 38 min    Activity Tolerance Patient tolerated treatment well    Behavior During Therapy [facility] for tasks assessed/performed                  Past Medical History:  Diagnosis Date   Barrett's esophagus    Chronic back pain    Depression    Gastritis    Mild   Gastroparesis    GERD (gastroesophageal reflux disease)    Headache    History of kidney stones    Hyperglycemia    Hypertension    Nephrolithiasis    Pneumonia    Covid pneumonia   Renal cyst    Sleep apnea    Stricture and stenosis of esophagus    Stroke ([place]) 07/2015   Vision abnormalities    Past Surgical History:  Procedure Laterality Date   CERVICAL [place] SURGERY  2012   COLONOSCOPY     LUMBAR [place] SURGERY  2005, 2010   replaced L4 and L5   SHOULDER ARTHROSCOPY WITH ROTATOR CUFF REPAIR Left 08/14/2020   Procedure: SHOULDER ARTHROSCOPY WITH ROTATOR CUFF REPAIR, SUBACROMIAL DECOMPRESSION;  Surgeon: Tania Ade, MD;  Location: [facility];  Service: Orthopedics;  Laterality: Left;   UPPER GASTROINTESTINAL ENDOSCOPY     Patient Active Problem List   Diagnosis Date Noted   Drug-induced constipation 06/08/2022   Hemiparesis affecting left side as late effect of cerebrovascular accident ([place]) 12/09/2021   Depression, major, recurrent, mild ([place])    Sleep disturbance    Urinary retention    Bradycardia    Essential hypertension    Temperature elevated    Left thyroid nodule    Treatment-emergent central sleep apnea 10/25/2018   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) 10/25/2018   Chronic  pain syndrome    Chronic bilateral low back pain    Benign essential HTN    Tobacco abuse    Hyperlipidemia    History of CVA with residual deficit    Dysphagia, post-stroke    ICH (intracerebral hemorrhage) ([place]) - R thalmic/PLIC d/t HTN [id number]   Insomnia 07/13/2017   PAD (peripheral artery disease) ([place]) 07/13/2017   Hyperlipidemia with target LDL less than 70 07/13/2017   Stroke-like symptom 09/22/2016   Paresthesia 09/22/2016   CVA (cerebral vascular accident) ([place]) 09/22/2016   Cerebrovascular accident (CVA) due to thrombosis of left carotid artery ([place]) 09/23/2015   Primary snoring 09/23/2015   Hypersomnia with sleep apnea 09/23/2015   Obstructive sleep apnea 09/23/2015   Lacunar infarct, acute ([place]) 08/25/2015   Sleep apnea 08/25/2015   Dysarthria    CVD (cardiovascular disease) 08/02/2015   Acute hyperglycemia 0000000   Systolic hypertension with cerebrovascular disease 12/27/2014   Epistaxis 07/28/2012   NEPHROLITHIASIS 05/23/2009   BARRETTS ESOPHAGUS 02/20/2009   Gastroparesis 02/20/2009   RADICULOPATHY 10/21/2008   GERD 10/08/2008   ESOPHAGITIS 08/15/2008   ESOPHAGEAL STRICTURE 08/15/2008   GASTRITIS, ACUTE 08/15/2008   RENAL CYST 08/14/2008   TOBACCO ABUSE 08/08/2008   HEMATURIA, MICROSCOPIC, HX  OF 08/08/2008   PULMONARY NODULE 01/10/2008    PCP: Dr Betty Martinique   REFERRING PROVIDER: Dr Betty Martinique   REFERRING DIAG:  Diagnosis  R29.6 (ICD-10-CM) - Frequent falls    THERAPY DIAG:  Muscle weakness (generalized)  Repeated falls  Other low back pain  Rationale for Evaluation and Treatment: Rehabilitation  ONSET DATE: increasing number of falls over the past few weeks  SUBJECTIVE:   SUBJECTIVE STATEMENT:  Pt state he has had no falls since last visit. He asks about being able to drive. His back pain is unchanged.  Eval: The patient has a long history of balance disturbances following a stroke in 2016. He reports over the past several  months the balance deficits have increased. He has had 4 major falls. 3 of which were falling backwards. The other fall was from tripping over an object. He is using a cane right now 2nd to decreased ability to use his left arm.   PERTINENT HISTORY: Depression, chronic low back pain, Lumbar surgery 2005 , 2010 ; RTC repair 2021; CVA, HTN, PNA, Stroke 2016. Stroke effected Left UE,   PAIN:  Are you having pain? Yes: NPRS scale: 5/10 Pain location: middle of his lower back  Pain description: aching  Aggravating factors: bending  Relieving factors: rest   PRECAUTIONS: Fall  WEIGHT BEARING RESTRICTIONS: No  FALLS:  Has patient fallen in last 6 months? Yes. 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 a week  PLOF: Independent with use of a cane   PATIENT GOALS:  To improve balance   OBJECTIVE:   FOTO 42 pts 2/6   BERG Balance Test                                              Date: 12/21/22   Sit to Stand 3  Standing unsupported 3  Sitting with back unsupported but feet supported 4  Stand to sit  2  Transfers  3  Standing unsupported with eyes closed 4  Standing unsupported feet together 3  From standing position, reach forward with outstretched arm 3  From standing position, pick up object from floor 4  From standing position, turn and look behind over each shoulder 4  Turn 360 3  Standing unsupported, alternately place foot on step 0  Standing unsupported, one foot in front 3  Standing on one leg 0  Total:  39/56     5xSTS: 44s with UE assist  STS Transfers: improved fwd trunk lean during transition, reduction of posterior LOB, able to come to full standing independently   TODAY'S TREATMENT:  DATE:   2/26 Nu step Lvl 9 warm up 6 min UE and  LE  Obstacle course with and without SPC (step over, around, and pliant surfaces) 6x  Wobble board 2x20 each ML and AP taps Retro and semi-tandem walk long silver bar 4x laps each Standing HR/TR with UE support 20x   01/06/23: Pt seen for aquatic therapy today.  Treatment took place in water 3.2-4.75 ft in depth at the [facility] pool. Temp of water was 91.  Pt entered/exited the pool via stairs with bilat rail with supervision.  Therapist on deck entire session.   -With RUE support on barbell:  forward gait -> no support, continued forward gait with cues for narrow BOS - without support backward gait, side stepping, marching forward - Rt SLS x 10s (no support), Lt SLS with 2 finger support x 10s, without support - up to 2-4s -Holding wall with BUE (at drain): L stretch for low back; hip abdct 2 x10;  hip ext to toe touch x 10 each LE; - side to side lunge (difficult), weight shift to R/L legs and side reach L/R with hands laced, outside base of support (challenging) -STS at bench in water with side step Rt/Lt , moving down bench after each STS (x8 each direction), cues for R arm reach for improved forward weight shift -alternating Toe taps to 1st step in water without support  Pt requires the buoyancy and hydrostatic pressure of water for support, and to offload joints by unweighting joint load by at least 50 % in navel deep water and by at least 75-80% in chest to neck deep water.  Viscosity of the water is needed for resistance of strengthening. Water current perturbations provides challenge to standing balance requiring increased core activation.  2/20  Nu step Lvl 9 warm up 6 min UE and LE  Marching on airex 30 marches x3 (4x LOB) Wobble board 2x20 each ML and AP taps Staggered stance squat on airex 2x8 Resisted STS with focus on eccentric lower (YTB at waist) 3x5  Standing HR/TR with UE support 20x  12/30/22: Pt seen for aquatic therapy today.  Treatment took place in  water 3.2-4.75 ft in depth at the [facility] pool. Temp of water was 91.  Pt entered/exited the pool via stairs with bilat rail with supervision.  Therapist on deck entire session.    -With RUE support on barbell:  forward gait, backward gait, side stepping, marching forward -Holding wall with BUE: hip ext to toe touch x 10 each LE; L stretch for low back -RUE on wall:  Lt step forward over diving brick and reverse step over brick to return to neutral x 8; Lt side step over diving brick, and return to start -With light Rt hand support on wall:  SLS  (R/L) x 15-20s x 2 -Standard stance wth "turn and look behind" x 3 -STS at bench in water x 5; repeated with side step Rt, moving down bench after each STS (x6) -Lt Toe taps to 1st step in water with intermittent Rt hand on rail to steady, repeated with Rt -L stretch for lower back, holding bilat rails  Pt requires the buoyancy and hydrostatic pressure of water for support, and to offload joints by unweighting joint load by at least 50 % in navel deep water and by at least 75-80% in chest to neck deep water.  Viscosity of the water is needed for resistance of strengthening. Water current perturbations provides challenge to standing balance  requiring increased core activation.  2/6 Nu step Lvl 9 warm up 6 min UE and LE  Staggered stance squat on airex 2x8 NBOS reach with UE head hips on airex 2x20 Resisted STS with focus on eccentric lower (YTB at waist) 3x5  Stand fwd march 2x20 Minisquat at bar with Ue support 3x8 Lateral march at table 3x laps  Edu about safe home hip flexion exercise in seated and standing     12/23/22: Pt seen for aquatic therapy today.  Treatment took place in water 3.25-4.5 ft in depth at the [facility]. Temp of water was 91.  Pt entered/exited the pool via stairs with bilat rail with SBA.  Therapist on deck entire session.   Holding wall: side stepping R/L With light Rt hand support on wall:   SLS  (R/L) x 20s x 2 Without support:  forward gait, backward gait, side stepping  Holding wall with RUE:  LLE kicks front/ side x 10 each hip hinge with forward arm reach (RUE on barbell)- hip bump to wall and hip thrust forward to return to neutral Standard stance wth "turn and look behind" x 3 Holding barbell with RUE, high knee marching in place and then forward Alternating Toe taps to step in water with intermittent Rt hand on wall to steady STS at bench without UE support x 10 Pt requires the buoyancy and hydrostatic pressure of water for support, and to offload joints by unweighting joint load by at least 50 % in navel deep water and by at least 75-80% in chest to neck deep water.  Viscosity of the water is needed for resistance of strengthening. Water current perturbations provides challenge to standing balance requiring increased core activation.    2/6 Stand fwd march 2x20  NBOS "turn around/look" on flat surface 2x10 each way NBOS "turn around/look" semi tandem on Airex 2x10 (R sided LOB)  20f x4 laps Med and lateral gait perturbations; [id number] laps with AP perturbations (working on reactive stepping, increasing BOS width/stance) Fwd high step [id number] PATIENT EDUCATION:  Education details: aquatic progressions  Person educated: Patient Education method: ExpConsulting civil engineeremonstration, Tactile cues, Verbal cues Education comprehension: verbalized understanding, returned demonstration, verbal cues required, tactile cues required, and needs further education  HOME EXERCISE PROGRAM: Verbally discussed; gym machine usage and current walking program  ASSESSMENT:  CLINICAL IMPRESSION: Patient session progressed to more dynamic balance with obstacle course training at today's session.  Patient demonstrates difficulty with left foot clearance with navigating obstacles particularly when the object is closer to center gravity.  Unclear if spatially related or muscle intention related.   Patient does demonstrate improvement with continued repetition.  Patient advised that driving clearance is ultimately the decision of the physician.  However, PT advised that driving given reaction time, left upper extremity deficits, and falls risk with getting in and out of vehicle at driver side likely limits his ability to drive at this point.  Pt would benefit from continued skilled therapy in order to reach goals and maximize functional LE strength and balance for prevention of future falls and further functional decline.   OBJECTIVE IMPAIRMENTS: Abnormal gait, decreased activity tolerance, decreased knowledge of use of DME, decreased mobility, difficulty walking, decreased ROM, decreased strength, decreased safety awareness, increased fascial restrictions, increased muscle spasms, impaired flexibility, postural dysfunction, and pain.   ACTIVITY LIMITATIONS: carrying, lifting, bending, standing, squatting, stairs, transfers, bed mobility, reach over head, and locomotion level  PARTICIPATION LIMITATIONS: meal prep, cleaning, laundry, driving, shopping, community  activity, and yard work  PERSONAL FACTORS: 3+ comorbidities: Low back Surgery, frequent falls; Past CVA   are also affecting patient's functional outcome.   REHAB POTENTIAL: Good  CLINICAL DECISION MAKING: Evolving/moderate complexity increasing falls  EVALUATION COMPLEXITY: Moderate   GOALS: Goals reviewed with patient? Yes  SHORT TERM GOALS: Target date: 12/10/2022   Patient will decrease 5x sit to stand time by 10 seconds and before all 5 reps.  Baseline: Goal status: ongoing; able to complete 5  2.  Patient will increase left quad and glut strength by 5 lbs  Baseline:  Goal status: Not tested today  3.  Patient will transfer sit to stand with SBA and decreased use of his hands  Baseline:  Goal status: met   LONG TERM GOALS: Target date: 03/21/23    Patient will have no major falls over a month  Baseline:  Goal  status: ongoing  2.  Patients will score > 42 on the BERG to show a decreased fall risk  Baseline:  Goal status: ongoing  3.  Patient will return to a full gym program  Baseline:  Goal status: ongoing   PLAN:  PT FREQUENCY: 2x/week  PT DURATION: 12 wks (aim for D/C in 8)  PLANNED INTERVENTIONS: Therapeutic exercises, Therapeutic activity, Neuromuscular re-education, Balance training, Gait training, Patient/Family education, Self Care, Joint mobilization, Stair training, DME instructions, Aquatic Therapy, Spinal mobilization, Cryotherapy, Moist heat, Ultrasound, and Manual therapy  PLAN FOR NEXT SESSION:  In water work on posterior balance and lifting legs high. Work on the same things on land.  Add more Berg items to the exercises completed in water.   Daleen Bo PT, DPT 01/10/23 9:32 AM

## 2023-01-13 ENCOUNTER — Ambulatory Visit (HOSPITAL_BASED_OUTPATIENT_CLINIC_OR_DEPARTMENT_OTHER): Payer: Medicare Other | Admitting: Physical Therapy

## 2023-01-13 DIAGNOSIS — M6281 Muscle weakness (generalized): Secondary | ICD-10-CM | POA: Diagnosis not present

## 2023-01-13 DIAGNOSIS — M5459 Other low back pain: Secondary | ICD-10-CM

## 2023-01-13 DIAGNOSIS — R296 Repeated falls: Secondary | ICD-10-CM

## 2023-01-13 NOTE — Therapy (Signed)
OUTPATIENT PHYSICAL THERAPY LOWER EXTREMITY TREATMENT    Patient Name: Richard Vaughn MRN: [id number] DOB:02-Jul-1951, 72 y.o., male Today's Date: 01/13/2023  END OF SESSION:  PT End of Session - 01/13/23 1633     Visit Number 17    Number of Visits 21    Date for PT Re-Evaluation 03/21/23    Authorization Type MCR    PT Start Time 1622    PT Stop Time 1700    PT Time Calculation (min) 38 min    Behavior During Therapy WFL for tasks assessed/performed                  Past Medical History:  Diagnosis Date   Barrett's esophagus    Chronic back pain    Depression    Gastritis    Mild   Gastroparesis    GERD (gastroesophageal reflux disease)    Headache    History of kidney stones    Hyperglycemia    Hypertension    Nephrolithiasis    Pneumonia    Covid pneumonia   Renal cyst    Sleep apnea    Stricture and stenosis of esophagus    Stroke ([place]) 07/2015   Vision abnormalities    Past Surgical History:  Procedure Laterality Date   CERVICAL [place] SURGERY  2012   COLONOSCOPY     LUMBAR [place] SURGERY  2005, 2010   replaced L4 and L5   SHOULDER ARTHROSCOPY WITH ROTATOR CUFF REPAIR Left 08/14/2020   Procedure: SHOULDER ARTHROSCOPY WITH ROTATOR CUFF REPAIR, SUBACROMIAL DECOMPRESSION;  Surgeon: Tania Ade, MD;  Location: [facility];  Service: Orthopedics;  Laterality: Left;   UPPER GASTROINTESTINAL ENDOSCOPY     Patient Active Problem List   Diagnosis Date Noted   Drug-induced constipation 06/08/2022   Hemiparesis affecting left side as late effect of cerebrovascular accident ([place]) 12/09/2021   Depression, major, recurrent, mild ([place])    Sleep disturbance    Urinary retention    Bradycardia    Essential hypertension    Temperature elevated    Left thyroid nodule    Treatment-emergent central sleep apnea 10/25/2018   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) 10/25/2018   Chronic pain syndrome    Chronic bilateral low back pain     Benign essential HTN    Tobacco abuse    Hyperlipidemia    History of CVA with residual deficit    Dysphagia, post-stroke    ICH (intracerebral hemorrhage) ([place]) - R thalmic/PLIC d/t HTN 123456   Insomnia 07/13/2017   PAD (peripheral artery disease) ([place]) 07/13/2017   Hyperlipidemia with target LDL less than 70 07/13/2017   Stroke-like symptom 09/22/2016   Paresthesia 09/22/2016   CVA (cerebral vascular accident) ([place]) 09/22/2016   Cerebrovascular accident (CVA) due to thrombosis of left carotid artery ([place]) 09/23/2015   Primary snoring 09/23/2015   Hypersomnia with sleep apnea 09/23/2015   Obstructive sleep apnea 09/23/2015   Lacunar infarct, acute ([place]) 08/25/2015   Sleep apnea 08/25/2015   Dysarthria    CVD (cardiovascular disease) 08/02/2015   Acute hyperglycemia 0000000   Systolic hypertension with cerebrovascular disease 12/27/2014   Epistaxis 07/28/2012   NEPHROLITHIASIS 05/23/2009   BARRETTS ESOPHAGUS 02/20/2009   Gastroparesis 02/20/2009   RADICULOPATHY 10/21/2008   GERD 10/08/2008   ESOPHAGITIS 08/15/2008   ESOPHAGEAL STRICTURE 08/15/2008   GASTRITIS, ACUTE 08/15/2008   RENAL CYST 08/14/2008   TOBACCO ABUSE 08/08/2008   HEMATURIA, MICROSCOPIC, HX OF 08/08/2008   PULMONARY NODULE 01/10/2008  PCP: Dr Betty Martinique   REFERRING PROVIDER: Dr Betty Martinique   REFERRING DIAG:  Diagnosis  R29.6 (ICD-10-CM) - Frequent falls    THERAPY DIAG:  Muscle weakness (generalized)  Repeated falls  Other low back pain  Rationale for Evaluation and Treatment: Rehabilitation  ONSET DATE: increasing number of falls over the past few weeks  SUBJECTIVE:   SUBJECTIVE STATEMENT:  Pt reports no changes since last visit.  His wife is present and dressed for pool to accompany pt.   Eval: The patient has a long history of balance disturbances following a stroke in 2016. He reports over the past several months the balance deficits have increased. He has had 4 major  falls. 3 of which were falling backwards. The other fall was from tripping over an object. He is using a cane right now 2nd to decreased ability to use his left arm.   PERTINENT HISTORY: Depression, chronic low back pain, Lumbar surgery 2005 , 2010 ; RTC repair 2021; CVA, HTN, PNA, Stroke 2016. Stroke effected Left UE,   PAIN:  Are you having pain? Yes: NPRS scale: 5/10 Pain location: middle of his lower back  Pain description: aching  Aggravating factors: bending  Relieving factors: rest   PRECAUTIONS: Fall  WEIGHT BEARING RESTRICTIONS: No  FALLS:  Has patient fallen in last 6 months? Yes. 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 a week  PLOF: Independent with use of a cane   PATIENT GOALS:  To improve balance   OBJECTIVE:   FOTO 42 pts 2/6   BERG Balance Test                                              Date: 12/21/22   Sit to Stand 3  Standing unsupported 3  Sitting with back unsupported but feet supported 4  Stand to sit  2  Transfers  3  Standing unsupported with eyes closed 4  Standing unsupported feet together 3  From standing position, reach forward with outstretched arm 3  From standing position, pick up object from floor 4  From standing position, turn and look behind over each shoulder 4  Turn 360 3  Standing unsupported, alternately place foot on step 0  Standing unsupported, one foot in front 3  Standing on one leg 0  Total:  39/56     5xSTS: 44s with UE assist  STS Transfers: improved fwd trunk lean during transition, reduction of posterior LOB, able to come to full standing independently   TODAY'S TREATMENT:  DATE:  01/13/23: Pt seen for aquatic therapy today.  Treatment took place in water 3.2-4.75 ft in depth at the  [facility] pool. Temp of water was 91.  Pt entered/exited the pool via stairs with bilat rail with supervision.  Therapist on deck entire session.   - without support forward/ backward gait, marching forward/backward - with orange weighted ball - Lt step and pass it forward to wife x 8;  step and reach (crossing midline, both hands onball) x 5 each L/R; gentle trunk rotation with both hands on ball - side stepping L/R  - L stretch at wall - SLS with 2 fingers on wall x 10s each x 2 - holding wall:  heel raises x 10, hip ext to toe touch x 10 each - holding yellow noodle:  hip abdct/addct x 10 each, high knee marching forward; forward/backward gait with increased speed - SLS with 3 way toe tap x 5 each with yellow noodle - L stretch at stairs  Pt requires the buoyancy and hydrostatic pressure of water for support, and to offload joints by unweighting joint load by at least 50 % in navel deep water and by at least 75-80% in chest to neck deep water.  Viscosity of the water is needed for resistance of strengthening. Water current perturbations provides challenge to standing balance requiring increased core activation.  2/26 Nu step Lvl 9 warm up 6 min UE and LE  Obstacle course with and without SPC (step over, around, and pliant surfaces) 6x  Wobble board 2x20 each ML and AP taps Retro and semi-tandem walk long silver bar 4x laps each Standing HR/TR with UE support 20x   01/06/23: Pt seen for aquatic therapy today.  Treatment took place in water 3.2-4.75 ft in depth at the [facility] pool. Temp of water was 91.  Pt entered/exited the pool via stairs with bilat rail with supervision.  Therapist on deck entire session.   -With RUE support on barbell:  forward gait -> no support, continued forward gait with cues for narrow BOS - without support backward gait, side stepping, marching forward - Rt SLS x 10s (no support), Lt SLS with 2 finger support x 10s, without support -  up to 2-4s -Holding wall with BUE (at drain): L stretch for low back; hip abdct 2 x10;  hip ext to toe touch x 10 each LE; - side to side lunge (difficult), weight shift to R/L legs and side reach L/R with hands laced, outside base of support (challenging) -STS at bench in water with side step Rt/Lt , moving down bench after each STS (x8 each direction), cues for R arm reach for improved forward weight shift -alternating Toe taps to 1st step in water without support  Pt requires the buoyancy and hydrostatic pressure of water for support, and to offload joints by unweighting joint load by at least 50 % in navel deep water and by at least 75-80% in chest to neck deep water.  Viscosity of the water is needed for resistance of strengthening. Water current perturbations provides challenge to standing balance requiring increased core activation.  2/20  Nu step Lvl 9 warm up 6 min UE and LE  Marching on airex 30 marches x3 (4x LOB) Wobble board 2x20 each ML and AP taps Staggered stance squat on airex 2x8 Resisted STS with focus on eccentric lower (YTB at waist) 3x5  Standing HR/TR with UE support 20x  12/30/22: Pt seen for aquatic therapy today.  Treatment took  place in water 3.2-4.75 ft in depth at the [facility] pool. Temp of water was 91.  Pt entered/exited the pool via stairs with bilat rail with supervision.  Therapist on deck entire session.    -With RUE support on barbell:  forward gait, backward gait, side stepping, marching forward -Holding wall with BUE: hip ext to toe touch x 10 each LE; L stretch for low back -RUE on wall:  Lt step forward over diving brick and reverse step over brick to return to neutral x 8; Lt side step over diving brick, and return to start -With light Rt hand support on wall:  SLS  (R/L) x 15-20s x 2 -Standard stance wth "turn and look behind" x 3 -STS at bench in water x 5; repeated with side step Rt, moving down bench after each STS (x6) -Lt Toe taps  to 1st step in water with intermittent Rt hand on rail to steady, repeated with Rt -L stretch for lower back, holding bilat rails  Pt requires the buoyancy and hydrostatic pressure of water for support, and to offload joints by unweighting joint load by at least 50 % in navel deep water and by at least 75-80% in chest to neck deep water.  Viscosity of the water is needed for resistance of strengthening. Water current perturbations provides challenge to standing balance requiring increased core activation.  2/6 Nu step Lvl 9 warm up 6 min UE and LE  Staggered stance squat on airex 2x8 NBOS reach with UE head hips on airex 2x20 Resisted STS with focus on eccentric lower (YTB at waist) 3x5  Stand fwd march 2x20 Minisquat at bar with Ue support 3x8 Lateral march at table 3x laps  Edu about safe home hip flexion exercise in seated and standing     12/23/22: Pt seen for aquatic therapy today.  Treatment took place in water 3.25-4.5 ft in depth at the [facility]. Temp of water was 91.  Pt entered/exited the pool via stairs with bilat rail with SBA.  Therapist on deck entire session.   Holding wall: side stepping R/L With light Rt hand support on wall:  SLS  (R/L) x 20s x 2 Without support:  forward gait, backward gait, side stepping  Holding wall with RUE:  LLE kicks front/ side x 10 each hip hinge with forward arm reach (RUE on barbell)- hip bump to wall and hip thrust forward to return to neutral Standard stance wth "turn and look behind" x 3 Holding barbell with RUE, high knee marching in place and then forward Alternating Toe taps to step in water with intermittent Rt hand on wall to steady STS at bench without UE support x 10 Pt requires the buoyancy and hydrostatic pressure of water for support, and to offload joints by unweighting joint load by at least 50 % in navel deep water and by at least 75-80% in chest to neck deep water.  Viscosity of the water is needed for  resistance of strengthening. Water current perturbations provides challenge to standing balance requiring increased core activation.    2/6 Stand fwd march 2x20  NBOS "turn around/look" on flat surface 2x10 each way NBOS "turn around/look" semi tandem on Airex 2x10 (R sided LOB)  53f x4 laps Med and lateral gait perturbations; 35f4x laps with AP perturbations (working on reactive stepping, increasing BOS width/stance) Fwd high step [id number] PATIENT EDUCATION:  Education details: aquatic progressions  Person educated: Patient Education method: Explanation, Demonstration, [employer], Verbal  cues Education comprehension: verbalized understanding, returned demonstration, verbal cues required, tactile cues required, and needs further education  HOME EXERCISE PROGRAM: Verbally discussed; gym machine usage and current walking program  ASSESSMENT:  CLINICAL IMPRESSION:  Pt reported LBP up to 7/10 after trunk rotation and side stepping initially; reduced to 5/10 with lower back stretch. He is challenged with SLS, in addition to dynamic task for opposite leg.   Patient does demonstrate improvement with continued repetition.  His wife is instructed on how to assist him if he cannot regain his balance with a challenging exercise.  Encouraged pt and wife to consider coming to the pool outside of therapy hours for continued work on strengthening and balance exercises (they are members of [facility] [place] [facility]).  Pt making gradual progress towards remaining goals.  Pt would benefit from continued skilled therapy in order to reach goals and maximize functional LE strength and balance for prevention of future falls and further functional decline. PT to test STG #2? Next visit.   OBJECTIVE IMPAIRMENTS: Abnormal gait, decreased activity tolerance, decreased knowledge of use of DME, decreased mobility, difficulty walking, decreased ROM, decreased strength, decreased safety awareness, increased fascial restrictions,  increased muscle spasms, impaired flexibility, postural dysfunction, and pain.   ACTIVITY LIMITATIONS: carrying, lifting, bending, standing, squatting, stairs, transfers, bed mobility, reach over head, and locomotion level  PARTICIPATION LIMITATIONS: meal prep, cleaning, laundry, driving, shopping, community activity, and yard work  PERSONAL FACTORS: 3+ comorbidities: Low back Surgery, frequent falls; Past CVA   are also affecting patient's functional outcome.   REHAB POTENTIAL: Good  CLINICAL DECISION MAKING: Evolving/moderate complexity increasing falls  EVALUATION COMPLEXITY: Moderate   GOALS: Goals reviewed with patient? Yes  SHORT TERM GOALS: Target date: 12/10/2022   Patient will decrease 5x sit to stand time by 10 seconds and before all 5 reps.  Baseline: Goal status: ongoing; able to complete 5  2.  Patient will increase left quad and glut strength by 5 lbs  Baseline:  Goal status: Not tested today  3.  Patient will transfer sit to stand with SBA and decreased use of his hands  Baseline:  Goal status: met   LONG TERM GOALS: Target date: 03/21/23    Patient will have no major falls over a month  Baseline:  Goal status: ongoing  2.  Patients will score > 42 on the BERG to show a decreased fall risk  Baseline:  Goal status: ongoing  3.  Patient will return to a full gym program  Baseline:  Goal status: ongoing   PLAN:  PT FREQUENCY: 2x/week  PT DURATION: 12 wks (aim for D/C in 8)  PLANNED INTERVENTIONS: Therapeutic exercises, Therapeutic activity, Neuromuscular re-education, Balance training, Gait training, Patient/Family education, Self Care, Joint mobilization, Stair training, DME instructions, Aquatic Therapy, Spinal mobilization, Cryotherapy, Moist heat, Ultrasound, and Manual therapy  PLAN FOR NEXT SESSION:  In water work on posterior balance and lifting legs high. Work on the same things on land.  Add more Berg items to the exercises completed in  water.   Kerin Perna, PTA 01/13/23 6:18 PM [facility] [address] [place], [place], [address] Phone: [phone]   Fax:  [phone]

## 2023-01-17 ENCOUNTER — Ambulatory Visit (HOSPITAL_BASED_OUTPATIENT_CLINIC_OR_DEPARTMENT_OTHER): Payer: Medicare Other | Attending: Family Medicine | Admitting: Physical Therapy

## 2023-01-17 ENCOUNTER — Encounter (HOSPITAL_BASED_OUTPATIENT_CLINIC_OR_DEPARTMENT_OTHER): Payer: Self-pay | Admitting: Physical Therapy

## 2023-01-17 DIAGNOSIS — M6281 Muscle weakness (generalized): Secondary | ICD-10-CM | POA: Diagnosis not present

## 2023-01-17 DIAGNOSIS — M5459 Other low back pain: Secondary | ICD-10-CM | POA: Diagnosis not present

## 2023-01-17 DIAGNOSIS — R296 Repeated falls: Secondary | ICD-10-CM | POA: Diagnosis not present

## 2023-01-17 NOTE — Therapy (Signed)
OUTPATIENT PHYSICAL THERAPY LOWER EXTREMITY TREATMENT    Patient Name: Richard Vaughn MRN: [id number] DOB:Apr 18, 1951, 72 y.o., male Today's Date: 01/17/2023  END OF SESSION:  PT End of Session - 01/17/23 1411     Visit Number 18    Number of Visits 21    Date for PT Re-Evaluation 03/21/23    Authorization Type MCR    PT Start Time 1412   arrives late   PT Stop Time [id number]    PT Time Calculation (min) 33 min    Behavior During Therapy [facility] for tasks assessed/performed                   Past Medical History:  Diagnosis Date   Barrett's esophagus    Chronic back pain    Depression    Gastritis    Mild   Gastroparesis    GERD (gastroesophageal reflux disease)    Headache    History of kidney stones    Hyperglycemia    Hypertension    Nephrolithiasis    Pneumonia    Covid pneumonia   Renal cyst    Sleep apnea    Stricture and stenosis of esophagus    Stroke ([place]) 07/2015   Vision abnormalities    Past Surgical History:  Procedure Laterality Date   CERVICAL [place] SURGERY  2012   COLONOSCOPY     LUMBAR [place] SURGERY  2005, 2010   replaced L4 and L5   SHOULDER ARTHROSCOPY WITH ROTATOR CUFF REPAIR Left 08/14/2020   Procedure: SHOULDER ARTHROSCOPY WITH ROTATOR CUFF REPAIR, SUBACROMIAL DECOMPRESSION;  Surgeon: Tania Ade, MD;  Location: [facility];  Service: Orthopedics;  Laterality: Left;   UPPER GASTROINTESTINAL ENDOSCOPY     Patient Active Problem List   Diagnosis Date Noted   Drug-induced constipation 06/08/2022   Hemiparesis affecting left side as late effect of cerebrovascular accident ([place]) 12/09/2021   Depression, major, recurrent, mild ([place])    Sleep disturbance    Urinary retention    Bradycardia    Essential hypertension    Temperature elevated    Left thyroid nodule    Treatment-emergent central sleep apnea 10/25/2018   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) 10/25/2018   Chronic pain syndrome    Chronic bilateral low  back pain    Benign essential HTN    Tobacco abuse    Hyperlipidemia    History of CVA with residual deficit    Dysphagia, post-stroke    ICH (intracerebral hemorrhage) ([place]) - R thalmic/PLIC d/t HTN [id number]   Insomnia 07/13/2017   PAD (peripheral artery disease) ([place]) 07/13/2017   Hyperlipidemia with target LDL less than 70 07/13/2017   Stroke-like symptom 09/22/2016   Paresthesia 09/22/2016   CVA (cerebral vascular accident) ([place]) 09/22/2016   Cerebrovascular accident (CVA) due to thrombosis of left carotid artery ([place]) 09/23/2015   Primary snoring 09/23/2015   Hypersomnia with sleep apnea 09/23/2015   Obstructive sleep apnea 09/23/2015   Lacunar infarct, acute ([place]) 08/25/2015   Sleep apnea 08/25/2015   Dysarthria    CVD (cardiovascular disease) 08/02/2015   Acute hyperglycemia 0000000   Systolic hypertension with cerebrovascular disease 12/27/2014   Epistaxis 07/28/2012   NEPHROLITHIASIS 05/23/2009   BARRETTS ESOPHAGUS 02/20/2009   Gastroparesis 02/20/2009   RADICULOPATHY 10/21/2008   GERD 10/08/2008   ESOPHAGITIS 08/15/2008   ESOPHAGEAL STRICTURE 08/15/2008   GASTRITIS, ACUTE 08/15/2008   RENAL CYST 08/14/2008   TOBACCO ABUSE 08/08/2008   HEMATURIA, MICROSCOPIC, HX OF 08/08/2008   PULMONARY  NODULE 01/10/2008    PCP: Dr Betty Martinique   REFERRING PROVIDER: Dr Betty Martinique   REFERRING DIAG:  Diagnosis  R29.6 (ICD-10-CM) - Frequent falls    THERAPY DIAG:  Muscle weakness (generalized)  Repeated falls  Other low back pain  Rationale for Evaluation and Treatment: Rehabilitation  ONSET DATE: increasing number of falls over the past few weeks  SUBJECTIVE:   SUBJECTIVE STATEMENT:  Pt reports no changes since last visit.  His wife is present and dressed for pool to accompany pt.   Eval: The patient has a long history of balance disturbances following a stroke in 2016. He reports over the past several months the balance deficits have increased. He has  had 4 major falls. 3 of which were falling backwards. The other fall was from tripping over an object. He is using a cane right now 2nd to decreased ability to use his left arm.   PERTINENT HISTORY: Depression, chronic low back pain, Lumbar surgery 2005 , 2010 ; RTC repair 2021; CVA, HTN, PNA, Stroke 2016. Stroke effected Left UE,   PAIN:  Are you having pain? Yes: NPRS scale: 5/10 Pain location: middle of his lower back  Pain description: aching  Aggravating factors: bending  Relieving factors: rest   PRECAUTIONS: Fall  WEIGHT BEARING RESTRICTIONS: No  FALLS:  Has patient fallen in last 6 months? Yes. 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 a week  PLOF: Independent with use of a cane   PATIENT GOALS:  To improve balance   OBJECTIVE:   FOTO 42 pts 2/6   BERG Balance Test                                              Date: 12/21/22   Sit to Stand 3  Standing unsupported 3  Sitting with back unsupported but feet supported 4  Stand to sit  2  Transfers  3  Standing unsupported with eyes closed 4  Standing unsupported feet together 3  From standing position, reach forward with outstretched arm 3  From standing position, pick up object from floor 4  From standing position, turn and look behind over each shoulder 4  Turn 360 3  Standing unsupported, alternately place foot on step 0  Standing unsupported, one foot in front 3  Standing on one leg 0  Total:  39/56     5xSTS: 44s with UE assist  STS Transfers: improved fwd trunk lean during transition, reduction of posterior LOB, able to come to full standing independently  LOWER EXTREMITY MMT:   MMT Right eval Left eval  Hip flexion 34.3 35.6  Hip extension      Hip abduction 47.0 41.7  Hip adduction      Hip internal rotation      Hip external rotation      Knee flexion      Knee extension 44.3 32.6  Ankle  dorsiflexion      Ankle plantarflexion      Ankle inversion      Ankle eversion       (Blank rows = not tested)   TODAY'S TREATMENT:  DATE:   2/26 Nu step Lvl 9 warm up 6 min UE and LE  Outdoor walking on uneven surfaces/sloped surfaces, step/stair management/sequencing; AD usage/assist positions from spouse with outdoor ambulation; preventing freezing/knees locking by dropping COG  Exercises - Gastroc Stretch on Wall  - 2 x daily - 7 x weekly - 1 sets - 3 reps - 30 hold - Heel Raise/Toe Raise  - 1 x daily - 7 x weekly - 2 sets - 10 reps - Standing March with Counter Support  - 1 x daily - 7 x weekly - 2 sets - 10 reps - Sit to Stand with Armchair  - 1 x daily - 7 x weekly - 3 sets - 5 reps  01/13/23: Pt seen for aquatic therapy today.  Treatment took place in water 3.2-4.75 ft in depth at the [facility] pool. Temp of water was 91.  Pt entered/exited the pool via stairs with bilat rail with supervision.  Therapist on deck entire session.   - without support forward/ backward gait, marching forward/backward - with orange weighted ball - Lt step and pass it forward to wife x 8;  step and reach (crossing midline, both hands onball) x 5 each L/R; gentle trunk rotation with both hands on ball - side stepping L/R  - L stretch at wall - SLS with 2 fingers on wall x 10s each x 2 - holding wall:  heel raises x 10, hip ext to toe touch x 10 each - holding yellow noodle:  hip abdct/addct x 10 each, high knee marching forward; forward/backward gait with increased speed - SLS with 3 way toe tap x 5 each with yellow noodle - L stretch at stairs  Pt requires the buoyancy and hydrostatic pressure of water for support, and to offload joints by unweighting joint load by at least 50 % in navel deep water and by at least 75-80% in chest to neck deep water.  Viscosity  of the water is needed for resistance of strengthening. Water current perturbations provides challenge to standing balance requiring increased core activation.  2/26 Nu step Lvl 9 warm up 6 min UE and LE  Obstacle course with and without SPC (step over, around, and pliant surfaces) 6x  Wobble board 2x20 each ML and AP taps Retro and semi-tandem walk long silver bar 4x laps each Standing HR/TR with UE support 20x   01/06/23: Pt seen for aquatic therapy today.  Treatment took place in water 3.2-4.75 ft in depth at the [facility] pool. Temp of water was 91.  Pt entered/exited the pool via stairs with bilat rail with supervision.  Therapist on deck entire session.   -With RUE support on barbell:  forward gait -> no support, continued forward gait with cues for narrow BOS - without support backward gait, side stepping, marching forward - Rt SLS x 10s (no support), Lt SLS with 2 finger support x 10s, without support - up to 2-4s -Holding wall with BUE (at drain): L stretch for low back; hip abdct 2 x10;  hip ext to toe touch x 10 each LE; - side to side lunge (difficult), weight shift to R/L legs and side reach L/R with hands laced, outside base of support (challenging) -STS at bench in water with side step Rt/Lt , moving down bench after each STS (x8 each direction), cues for R arm reach for improved forward weight shift -alternating Toe taps to 1st step in water without support  Pt requires the buoyancy and hydrostatic  pressure of water for support, and to offload joints by unweighting joint load by at least 50 % in navel deep water and by at least 75-80% in chest to neck deep water.  Viscosity of the water is needed for resistance of strengthening. Water current perturbations provides challenge to standing balance requiring increased core activation.  2/20  Nu step Lvl 9 warm up 6 min UE and LE  Marching on airex 30 marches x3 (4x LOB) Wobble board 2x20 each ML and AP  taps Staggered stance squat on airex 2x8 Resisted STS with focus on eccentric lower (YTB at waist) 3x5  Standing HR/TR with UE support 20x  12/30/22: Pt seen for aquatic therapy today.  Treatment took place in water 3.2-4.75 ft in depth at the [facility] pool. Temp of water was 91.  Pt entered/exited the pool via stairs with bilat rail with supervision.  Therapist on deck entire session.    -With RUE support on barbell:  forward gait, backward gait, side stepping, marching forward -Holding wall with BUE: hip ext to toe touch x 10 each LE; L stretch for low back -RUE on wall:  Lt step forward over diving brick and reverse step over brick to return to neutral x 8; Lt side step over diving brick, and return to start -With light Rt hand support on wall:  SLS  (R/L) x 15-20s x 2 -Standard stance wth "turn and look behind" x 3 -STS at bench in water x 5; repeated with side step Rt, moving down bench after each STS (x6) -Lt Toe taps to 1st step in water with intermittent Rt hand on rail to steady, repeated with Rt -L stretch for lower back, holding bilat rails  Pt requires the buoyancy and hydrostatic pressure of water for support, and to offload joints by unweighting joint load by at least 50 % in navel deep water and by at least 75-80% in chest to neck deep water.  Viscosity of the water is needed for resistance of strengthening. Water current perturbations provides challenge to standing balance requiring increased core activation.  PATIENT EDUCATION:  Education details: aquatic progressions  Person educated: Patient Education method: Explanation, Demonstration, Corporate treasurer cues, Verbal cues Education comprehension: verbalized understanding, returned demonstration, verbal cues required, tactile cues required, and needs further education  HOME EXERCISE PROGRAM:  Access Code: [email or web address] URL: [email or web address] Date: 01/17/2023 Prepared by: Daleen Bo  ASSESSMENT:  CLINICAL IMPRESSION:  Session performed largely outdoors in front of clinic in order to recreate and assess/address balance deficits with community ambulation on uneven/sloped surfaces. Gait belt used throughout session. With sloped walking, particularly uphill, pt lacks ankle dorsiflexion and catches foot with gait. Pt does have ankle dorsiflexion AROM so likely more due to triceps surae tightness. Pt advised on proper sequencing with good return demo and safe performance of stair management with SBA. Pt HEP updated today to help address muscle tightness which may be contributing to toe drag during gait. Pt advised that fatigue plays a large role in his toe drag so to plan for standing rest breaks during outdoor walking.  Pt also advised to trial dropping COG into small squat when he feels he is afraid of falling..  Pt would benefit from continued skilled therapy in order to reach goals and maximize functional LE strength and balance for prevention of future falls and further functional decline.   Plan to HHD test knee extension at future sessions as time allows.   OBJECTIVE IMPAIRMENTS: Abnormal gait, decreased activity tolerance, decreased  knowledge of use of DME, decreased mobility, difficulty walking, decreased ROM, decreased strength, decreased safety awareness, increased fascial restrictions, increased muscle spasms, impaired flexibility, postural dysfunction, and pain.   ACTIVITY LIMITATIONS: carrying, lifting, bending, standing, squatting, stairs, transfers, bed mobility, reach over head, and locomotion level  PARTICIPATION LIMITATIONS: meal prep, cleaning, laundry, driving, shopping, community activity, and yard work  PERSONAL FACTORS: 3+ comorbidities: Low back Surgery, frequent falls; Past CVA   are also affecting patient's functional outcome.   REHAB POTENTIAL: Good  CLINICAL DECISION MAKING: Evolving/moderate complexity increasing falls  EVALUATION COMPLEXITY:  Moderate   GOALS: Goals reviewed with patient? Yes  SHORT TERM GOALS: Target date: 12/10/2022   Patient will decrease 5x sit to stand time by 10 seconds and before all 5 reps.  Baseline: Goal status: ongoing; able to complete 5  2.  Patient will increase left quad and glut strength by 5 lbs  Baseline:  Goal status: Not tested today  3.  Patient will transfer sit to stand with SBA and decreased use of his hands  Baseline:  Goal status: met   LONG TERM GOALS: Target date: 03/21/23    Patient will have no major falls over a month  Baseline:  Goal status: ongoing  2.  Patients will score > 42 on the BERG to show a decreased fall risk  Baseline:  Goal status: ongoing  3.  Patient will return to a full gym program  Baseline:  Goal status: ongoing   PLAN:  PT FREQUENCY: 2x/week  PT DURATION: 12 wks (aim for D/C in 8)  PLANNED INTERVENTIONS: Therapeutic exercises, Therapeutic activity, Neuromuscular re-education, Balance training, Gait training, Patient/Family education, Self Care, Joint mobilization, Stair training, DME instructions, Aquatic Therapy, Spinal mobilization, Cryotherapy, Moist heat, Ultrasound, and Manual therapy  PLAN FOR NEXT SESSION:  In water work on posterior balance and lifting legs high. Work on the same things on land.  Add more Berg items to the exercises completed in water.    Daleen Bo PT, DPT 01/17/23 2:53 PM

## 2023-01-18 ENCOUNTER — Other Ambulatory Visit: Payer: Self-pay | Admitting: Family Medicine

## 2023-01-18 DIAGNOSIS — G894 Chronic pain syndrome: Secondary | ICD-10-CM

## 2023-01-18 DIAGNOSIS — G8929 Other chronic pain: Secondary | ICD-10-CM

## 2023-01-18 NOTE — Telephone Encounter (Signed)
Prescription Request  01/18/2023  LOV: 11/03/2022  What is the name of the medication or equipment?  Oxycodone HCl 10 MG TABS    Have you contacted your pharmacy to request a refill? No   Which pharmacy would you like this sent to?   [id number] [place],  [address]: [phone] Fax: [phone]     Pt's spouse called to say Pt is completely out of Oxycodone.   Patient notified that their request is being sent to the clinical staff for review and that they should receive a response within 2-3 business days.   Please advise at Mobile [phone](mobile)

## 2023-01-20 ENCOUNTER — Ambulatory Visit (HOSPITAL_BASED_OUTPATIENT_CLINIC_OR_DEPARTMENT_OTHER): Payer: Medicare Other | Admitting: Physical Therapy

## 2023-01-20 ENCOUNTER — Encounter (HOSPITAL_BASED_OUTPATIENT_CLINIC_OR_DEPARTMENT_OTHER): Payer: Self-pay | Admitting: Physical Therapy

## 2023-01-20 DIAGNOSIS — M6281 Muscle weakness (generalized): Secondary | ICD-10-CM | POA: Diagnosis not present

## 2023-01-20 DIAGNOSIS — M5459 Other low back pain: Secondary | ICD-10-CM | POA: Diagnosis not present

## 2023-01-20 DIAGNOSIS — R296 Repeated falls: Secondary | ICD-10-CM

## 2023-01-20 MED ORDER — OXYCODONE HCL 10 MG PO TABS: 10.0000 mg | ORAL_TABLET | Freq: Two times a day (BID) | ORAL | 0 refills | Status: DC

## 2023-01-20 NOTE — Telephone Encounter (Signed)
Checking progress on refill request

## 2023-01-20 NOTE — Therapy (Signed)
OUTPATIENT PHYSICAL THERAPY LOWER EXTREMITY TREATMENT    Patient Name: Richard Vaughn MRN: [id number] DOB:1951/05/28, 72 y.o., male Today's Date: 01/20/2023  END OF SESSION:  PT End of Session - 01/20/23 1146     Visit Number 19    Number of Visits 21    Date for PT Re-Evaluation 03/21/23    Authorization Type MCR    PT Start Time 1130   pt arrived late to pool area   PT Stop Time 1200    PT Time Calculation (min) 30 min    Behavior During Therapy [facility] for tasks assessed/performed                   Past Medical History:  Diagnosis Date   Barrett's esophagus    Chronic back pain    Depression    Gastritis    Mild   Gastroparesis    GERD (gastroesophageal reflux disease)    Headache    History of kidney stones    Hyperglycemia    Hypertension    Nephrolithiasis    Pneumonia    Covid pneumonia   Renal cyst    Sleep apnea    Stricture and stenosis of esophagus    Stroke ([place]) 07/2015   Vision abnormalities    Past Surgical History:  Procedure Laterality Date   CERVICAL [place] SURGERY  2012   COLONOSCOPY     LUMBAR [place] SURGERY  2005, 2010   replaced L4 and L5   SHOULDER ARTHROSCOPY WITH ROTATOR CUFF REPAIR Left 08/14/2020   Procedure: SHOULDER ARTHROSCOPY WITH ROTATOR CUFF REPAIR, SUBACROMIAL DECOMPRESSION;  Surgeon: Richard Ade, MD;  Location: [facility];  Service: Orthopedics;  Laterality: Left;   UPPER GASTROINTESTINAL ENDOSCOPY     Patient Active Problem List   Diagnosis Date Noted   Drug-induced constipation 06/08/2022   Hemiparesis affecting left side as late effect of cerebrovascular accident ([place]) 12/09/2021   Depression, major, recurrent, mild ([place])    Sleep disturbance    Urinary retention    Bradycardia    Essential hypertension    Temperature elevated    Left thyroid nodule    Treatment-emergent central sleep apnea 10/25/2018   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) 10/25/2018   Chronic pain syndrome    Chronic  bilateral low back pain    Benign essential HTN    Tobacco abuse    Hyperlipidemia    History of CVA with residual deficit    Dysphagia, post-stroke    ICH (intracerebral hemorrhage) ([place]) - R thalmic/PLIC d/t HTN [id number]   Insomnia 07/13/2017   PAD (peripheral artery disease) ([place]) 07/13/2017   Hyperlipidemia with target LDL less than 70 07/13/2017   Stroke-like symptom 09/22/2016   Paresthesia 09/22/2016   CVA (cerebral vascular accident) ([place]) 09/22/2016   Cerebrovascular accident (CVA) due to thrombosis of left carotid artery ([place]) 09/23/2015   Primary snoring 09/23/2015   Hypersomnia with sleep apnea 09/23/2015   Obstructive sleep apnea 09/23/2015   Lacunar infarct, acute ([place]) 08/25/2015   Sleep apnea 08/25/2015   Dysarthria    CVD (cardiovascular disease) 08/02/2015   Acute hyperglycemia 0000000   Systolic hypertension with cerebrovascular disease 12/27/2014   Epistaxis 07/28/2012   NEPHROLITHIASIS 05/23/2009   BARRETTS ESOPHAGUS 02/20/2009   Gastroparesis 02/20/2009   RADICULOPATHY 10/21/2008   GERD 10/08/2008   ESOPHAGITIS 08/15/2008   ESOPHAGEAL STRICTURE 08/15/2008   GASTRITIS, ACUTE 08/15/2008   RENAL CYST 08/14/2008   TOBACCO ABUSE 08/08/2008   HEMATURIA, MICROSCOPIC, HX OF  08/08/2008   PULMONARY NODULE 01/10/2008    PCP: Dr Richard Martinique   REFERRING PROVIDER: Dr Richard Martinique   REFERRING DIAG:  Diagnosis  R29.6 (ICD-10-CM) - Frequent falls    THERAPY DIAG:  Muscle weakness (generalized)  Repeated falls  Other low back pain  Rationale for Evaluation and Treatment: Rehabilitation  ONSET DATE: increasing number of falls over the past few weeks  SUBJECTIVE:   SUBJECTIVE STATEMENT:  Pt reports "Richard Vaughn stepped it up (last session). But it was alright".   Eval: The patient has a long history of balance disturbances following a stroke in 2016. He reports over the past several months the balance deficits have increased. He has had 4 major falls.  3 of which were falling backwards. The other fall was from tripping over an object. He is using a cane right now 2nd to decreased ability to use his left arm.   PERTINENT HISTORY: Depression, chronic low back pain, Lumbar surgery 2005 , 2010 ; RTC repair 2021; CVA, HTN, PNA, Stroke 2016. Stroke effected Left UE,   PAIN:  Are you having pain? Yes: NPRS scale: 5/10 Pain location: middle of his lower back  Pain description: aching  Aggravating factors: bending  Relieving factors: rest   PRECAUTIONS: Fall  WEIGHT BEARING RESTRICTIONS: No  FALLS:  Has patient fallen in last 6 months? Yes. 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 a week  PLOF: Independent with use of a cane   PATIENT GOALS:  To improve balance   OBJECTIVE:   FOTO 42 pts 2/6   BERG Balance Test                                              Date: 12/21/22   Sit to Stand 3  Standing unsupported 3  Sitting with back unsupported but feet supported 4  Stand to sit  2  Transfers  3  Standing unsupported with eyes closed 4  Standing unsupported feet together 3  From standing position, reach forward with outstretched arm 3  From standing position, pick up object from floor 4  From standing position, turn and look behind over each shoulder 4  Turn 360 3  Standing unsupported, alternately place foot on step 0  Standing unsupported, one foot in front 3  Standing on one leg 0  Total:  39/56     5xSTS: 44s with UE assist  STS Transfers: improved fwd trunk lean during transition, reduction of posterior LOB, able to come to full standing independently  LOWER EXTREMITY MMT:   MMT Right eval Left eval  Hip flexion 34.3 35.6  Hip extension      Hip abduction 47.0 41.7  Hip adduction      Hip internal rotation      Hip external rotation      Knee flexion      Knee extension 44.3 32.6  Ankle dorsiflexion       Ankle plantarflexion      Ankle inversion      Ankle eversion       (Blank rows = not tested)   TODAY'S TREATMENT:  DATE:  01/20/23: Pt seen for aquatic therapy today.  Treatment took place in water 3.2-4.75 ft in depth at the [facility] pool. Temp of water was 91.  Pt entered/exited the pool via stairs with bilat rail with supervision.  Therapist on deck entire session.   - without support forward/ backward gait, marching forward/backward - with orange weighted ball - step R/L and reach , both hands onball) x 5 each L/R; gentle trunk rotation with both hands on ball - side stepping L/R with arm abdct/addct - L stretch at wall - SLS with 2 fingers on wall x 10s each x 2 - holding wall:  wall push ups x 8;   - semi tandem stance with horiz head turns - high knee marching forward;  forward walking kick (like soccer ball) (one width each) -holding wall:  3 way toe tap x 5 each - holding yellow noodle:  hip abdct/addct x 10 each, forward gait with increased speed - R/L hamstring stretch with foot on 2nd step x 10-15s x 2  2/26 Nu step Lvl 9 warm up 6 min UE and LE  Outdoor walking on uneven surfaces/sloped surfaces, step/stair management/sequencing; AD usage/assist positions from spouse with outdoor ambulation; preventing freezing/knees locking by dropping COG  Exercises - Gastroc Stretch on Wall  - 2 x daily - 7 x weekly - 1 sets - 3 reps - 30 hold - Heel Raise/Toe Raise  - 1 x daily - 7 x weekly - 2 sets - 10 reps - Standing March with Counter Support  - 1 x daily - 7 x weekly - 2 sets - 10 reps - Sit to Stand with Armchair  - 1 x daily - 7 x weekly - 3 sets - 5 reps  01/13/23: Pt seen for aquatic therapy today.  Treatment took place in water 3.2-4.75 ft in depth at the [facility] pool. Temp of water was 91.  Pt entered/exited the pool via  stairs with bilat rail with supervision.  Therapist on deck entire session.   - without support forward/ backward gait, marching forward/backward - with orange weighted ball - Lt step and pass it forward to wife x 8;  step and reach (crossing midline, both hands onball) x 5 each L/R; gentle trunk rotation with both hands on ball - side stepping L/R  - L stretch at wall - SLS with 2 fingers on wall x 10s each x 2 - holding wall:  heel raises x 10, hip ext to toe touch x 10 each - holding yellow noodle:  hip abdct/addct x 10 each, high knee marching forward; forward/backward gait with increased speed - SLS with 3 way toe tap x 5 each with yellow noodle - L stretch at stairs  2/26 Nu step Lvl 9 warm up 6 min UE and LE  Obstacle course with and without SPC (step over, around, and pliant surfaces) 6x  Wobble board 2x20 each ML and AP taps Retro and semi-tandem walk long silver bar 4x laps each Standing HR/TR with UE support 20x   01/06/23: Pt seen for aquatic therapy today.  Treatment took place in water 3.2-4.75 ft in depth at the [facility] pool. Temp of water was 91.  Pt entered/exited the pool via stairs with bilat rail with supervision.  Therapist on deck entire session.   -With RUE support on barbell:  forward gait -> no support, continued forward gait with cues for narrow BOS - without support backward gait, side stepping, marching forward -  Rt SLS x 10s (no support), Lt SLS with 2 finger support x 10s, without support - up to 2-4s -Holding wall with BUE (at drain): L stretch for low back; hip abdct 2 x10;  hip ext to toe touch x 10 each LE; - side to side lunge (difficult), weight shift to R/L legs and side reach L/R with hands laced, outside base of support (challenging) -STS at bench in water with side step Rt/Lt , moving down bench after each STS (x8 each direction), cues for R arm reach for improved forward weight shift -alternating Toe taps to 1st step in water without  support  Pt requires the buoyancy and hydrostatic pressure of water for support, and to offload joints by unweighting joint load by at least 50 % in navel deep water and by at least 75-80% in chest to neck deep water.  Viscosity of the water is needed for resistance of strengthening. Water current perturbations provides challenge to standing balance requiring increased core activation.  2/20  Nu step Lvl 9 warm up 6 min UE and LE  Marching on airex 30 marches x3 (4x LOB) Wobble board 2x20 each ML and AP taps Staggered stance squat on airex 2x8 Resisted STS with focus on eccentric lower (YTB at waist) 3x5  Standing HR/TR with UE support 20x  12/30/22: Pt seen for aquatic therapy today.  Treatment took place in water 3.2-4.75 ft in depth at the [facility] pool. Temp of water was 91.  Pt entered/exited the pool via stairs with bilat rail with supervision.  Therapist on deck entire session.    -With RUE support on barbell:  forward gait, backward gait, side stepping, marching forward -Holding wall with BUE: hip ext to toe touch x 10 each LE; L stretch for low back -RUE on wall:  Lt step forward over diving brick and reverse step over brick to return to neutral x 8; Lt side step over diving brick, and return to start -With light Rt hand support on wall:  SLS  (R/L) x 15-20s x 2 -Standard stance wth "turn and look behind" x 3 -STS at bench in water x 5; repeated with side step Rt, moving down bench after each STS (x6) -Lt Toe taps to 1st step in water with intermittent Rt hand on rail to steady, repeated with Rt -L stretch for lower back, holding bilat rails  Pt requires the buoyancy and hydrostatic pressure of water for support, and to offload joints by unweighting joint load by at least 50 % in navel deep water and by at least 75-80% in chest to neck deep water.  Viscosity of the water is needed for resistance of strengthening. Water current perturbations provides challenge to  standing balance requiring increased core activation.  PATIENT EDUCATION:  Education details: aquatic progressions  Person educated: Patient Education method: Explanation, Demonstration, Corporate treasurer cues, Verbal cues Education comprehension: verbalized understanding, returned demonstration, verbal cues required, tactile cues required, and needs further education  HOME EXERCISE PROGRAM:  Access Code: [email or web address] URL: [email or web address] Date: 01/17/2023 Prepared by: Daleen Bo  ASSESSMENT:  CLINICAL IMPRESSION:  Session focused on position and direction changes without floatation device.  He completes forward walking kicks well, without LOB and with good height of LLE.  Added hamstring stretch at end (very tight!) Pt completes brief low back stretches during session to ease lower back discomfort.  Pt would benefit from continued skilled therapy in order to reach goals and maximize functional LE strength and balance for prevention of future  falls and further functional decline.   Plan to HHD test knee extension at future sessions as time allows.   PT to complete 20th visit progress note next visit.   OBJECTIVE IMPAIRMENTS: Abnormal gait, decreased activity tolerance, decreased knowledge of use of DME, decreased mobility, difficulty walking, decreased ROM, decreased strength, decreased safety awareness, increased fascial restrictions, increased muscle spasms, impaired flexibility, postural dysfunction, and pain.   ACTIVITY LIMITATIONS: carrying, lifting, bending, standing, squatting, stairs, transfers, bed mobility, reach over head, and locomotion level  PARTICIPATION LIMITATIONS: meal prep, cleaning, laundry, driving, shopping, community activity, and yard work  PERSONAL FACTORS: 3+ comorbidities: Low back Surgery, frequent falls; Past CVA   are also affecting patient's functional outcome.   REHAB POTENTIAL: Good  CLINICAL DECISION MAKING: Evolving/moderate complexity  increasing falls  EVALUATION COMPLEXITY: Moderate   GOALS: Goals reviewed with patient? Yes  SHORT TERM GOALS: Target date: 12/10/2022   Patient will decrease 5x sit to stand time by 10 seconds and before all 5 reps.  Baseline: Goal status: ongoing; able to complete 5  2.  Patient will increase left quad and glut strength by 5 lbs  Baseline:  Goal status: Not tested today  3.  Patient will transfer sit to stand with SBA and decreased use of his hands  Baseline:  Goal status: met   LONG TERM GOALS: Target date: 03/21/23    Patient will have no major falls over a month  Baseline:  Goal status: ongoing  2.  Patients will score > 42 on the BERG to show a decreased fall risk  Baseline:  Goal status: ongoing  3.  Patient will return to a full gym program  Baseline:  Goal status: ongoing   PLAN:  PT FREQUENCY: 2x/week  PT DURATION: 12 wks (aim for D/C in 8)  PLANNED INTERVENTIONS: Therapeutic exercises, Therapeutic activity, Neuromuscular re-education, Balance training, Gait training, Patient/Family education, Self Care, Joint mobilization, Stair training, DME instructions, Aquatic Therapy, Spinal mobilization, Cryotherapy, Moist heat, Ultrasound, and Manual therapy  PLAN FOR NEXT SESSION:  In water work on posterior balance and lifting legs high. Work on the same things on land.  Add more Berg items to the exercises completed in water.   Kerin Perna, PTA 01/20/23 3:53 PM [facility] [address] [place], [place], [address] Phone: [phone]   Fax:  [phone]

## 2023-01-24 ENCOUNTER — Encounter (HOSPITAL_BASED_OUTPATIENT_CLINIC_OR_DEPARTMENT_OTHER): Payer: Self-pay | Admitting: Physical Therapy

## 2023-01-24 ENCOUNTER — Other Ambulatory Visit: Payer: Self-pay

## 2023-01-24 ENCOUNTER — Ambulatory Visit (HOSPITAL_BASED_OUTPATIENT_CLINIC_OR_DEPARTMENT_OTHER): Payer: Medicare Other | Admitting: Physical Therapy

## 2023-01-24 DIAGNOSIS — M5459 Other low back pain: Secondary | ICD-10-CM

## 2023-01-24 DIAGNOSIS — R296 Repeated falls: Secondary | ICD-10-CM

## 2023-01-24 DIAGNOSIS — G894 Chronic pain syndrome: Secondary | ICD-10-CM

## 2023-01-24 DIAGNOSIS — M6281 Muscle weakness (generalized): Secondary | ICD-10-CM | POA: Diagnosis not present

## 2023-01-24 MED ORDER — DULOXETINE HCL 60 MG PO CPEP: 60.0000 mg | ORAL_CAPSULE | Freq: Every day | ORAL | 1 refills | Status: DC

## 2023-01-24 NOTE — Therapy (Signed)
OUTPATIENT PHYSICAL THERAPY LOWER EXTREMITY TREATMENT   Progress Note Reporting Period 12/21/22 to 01/24/23  See note below for Objective Data and Assessment of Progress/Goals.      Patient Name: Richard Vaughn MRN: [id number] DOB:December 14, 1950, 72 y.o., male Today's Date: 01/24/2023  END OF SESSION:  PT End of Session - 01/24/23 1411     Visit Number 20    Number of Visits 30    Date for PT Re-Evaluation 04/24/23    Authorization Type MCR    PT Start Time [id number]    PT Stop Time [id number]    PT Time Calculation (min) 41 min    Behavior During Therapy [facility] for tasks assessed/performed                   Past Medical History:  Diagnosis Date   Barrett's esophagus    Chronic back pain    Depression    Gastritis    Mild   Gastroparesis    GERD (gastroesophageal reflux disease)    Headache    History of kidney stones    Hyperglycemia    Hypertension    Nephrolithiasis    Pneumonia    Covid pneumonia   Renal cyst    Sleep apnea    Stricture and stenosis of esophagus    Stroke ([place]) 07/2015   Vision abnormalities    Past Surgical History:  Procedure Laterality Date   CERVICAL [place] SURGERY  2012   COLONOSCOPY     LUMBAR [place] SURGERY  2005, 2010   replaced L4 and L5   SHOULDER ARTHROSCOPY WITH ROTATOR CUFF REPAIR Left 08/14/2020   Procedure: SHOULDER ARTHROSCOPY WITH ROTATOR CUFF REPAIR, SUBACROMIAL DECOMPRESSION;  Surgeon: Tania Ade, MD;  Location: [facility];  Service: Orthopedics;  Laterality: Left;   UPPER GASTROINTESTINAL ENDOSCOPY     Patient Active Problem List   Diagnosis Date Noted   Drug-induced constipation 06/08/2022   Hemiparesis affecting left side as late effect of cerebrovascular accident ([place]) 12/09/2021   Depression, major, recurrent, mild ([place])    Sleep disturbance    Urinary retention    Bradycardia    Essential hypertension    Temperature elevated    Left thyroid nodule    Treatment-emergent central sleep apnea 10/25/2018   Central  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 (CVA) 10/25/2018   Chronic pain syndrome    Chronic bilateral low back pain    Benign essential HTN    Tobacco abuse    Hyperlipidemia    History of CVA with residual deficit    Dysphagia, post-stroke    ICH (intracerebral hemorrhage) ([place]) - R thalmic/PLIC d/t HTN [id number]   Insomnia 07/13/2017   PAD (peripheral artery disease) ([place]) 07/13/2017   Hyperlipidemia with target LDL less than 70 07/13/2017   Stroke-like symptom 09/22/2016   Paresthesia 09/22/2016   CVA (cerebral vascular accident) ([place]) 09/22/2016   Cerebrovascular accident (CVA) due to thrombosis of left carotid artery ([place]) 09/23/2015   Primary snoring 09/23/2015   Hypersomnia with sleep apnea 09/23/2015   Obstructive sleep apnea 09/23/2015   Lacunar infarct, acute ([place]) 08/25/2015   Sleep apnea 08/25/2015   Dysarthria    CVD (cardiovascular disease) 08/02/2015   Acute hyperglycemia 0000000   Systolic hypertension with cerebrovascular disease 12/27/2014   Epistaxis 07/28/2012   NEPHROLITHIASIS 05/23/2009   BARRETTS ESOPHAGUS 02/20/2009   Gastroparesis 02/20/2009   RADICULOPATHY 10/21/2008   GERD 10/08/2008   ESOPHAGITIS 08/15/2008   ESOPHAGEAL STRICTURE 08/15/2008   GASTRITIS, ACUTE 08/15/2008  RENAL CYST 08/14/2008   TOBACCO ABUSE 08/08/2008   HEMATURIA, MICROSCOPIC, HX OF 08/08/2008   PULMONARY NODULE 01/10/2008    PCP: Dr Betty Martinique   REFERRING PROVIDER: Dr Betty Martinique   REFERRING DIAG:  Diagnosis  R29.6 (ICD-10-CM) - Frequent falls    THERAPY DIAG:  Muscle weakness (generalized)  Repeated falls  Other low back pain  Rationale for Evaluation and Treatment: Rehabilitation  ONSET DATE: increasing number of falls over the past few weeks  SUBJECTIVE:   SUBJECTIVE STATEMENT:  Pt that he feels improvement. He was not sore after last session. He would like to continue with more land based balance as that is helping him more.    Eval: The patient has a long history of balance disturbances following a stroke in 2016. He reports over the past several months the balance deficits have increased. He has had 4 major falls. 3 of which were falling backwards. The other fall was from tripping over an object. He is using a cane right now 2nd to decreased ability to use his left arm.   PERTINENT HISTORY: Depression, chronic low back pain, Lumbar surgery 2005 , 2010 ; RTC repair 2021; CVA, HTN, PNA, Stroke 2016. Stroke effected Left UE,   PAIN:  Are you having pain? Yes: NPRS scale: 5/10 Pain location: middle of his lower back  Pain description: aching  Aggravating factors: bending  Relieving factors: rest   PRECAUTIONS: Fall  WEIGHT BEARING RESTRICTIONS: No  FALLS:  Has patient fallen in last 6 months? Yes. 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 a week  PLOF: Independent with use of a cane   PATIENT GOALS:  To improve balance   OBJECTIVE:   FOTO 42 pts 2/6  3/11 FOTO 45   BERG Balance Test                                              Date: 3/11   Sit to Stand 3  Standing unsupported 4  Sitting with back unsupported but feet supported 4  Stand to sit  3  Transfers  3  Standing unsupported with eyes closed 4  Standing unsupported feet together 4  From standing position, reach forward with outstretched arm 3  From standing position, pick up object from floor 4  From standing position, turn and look behind over each shoulder 4  Turn 360 3  Standing unsupported, alternately place foot on step 1  Standing unsupported, one foot in front 3  Standing on one leg 1  Total:  41/56     5xSTS: 45.2s  with UE assist  STS Transfers: improved fwd trunk lean during transition, reduction of posterior LOB, able to come to full standing independently  LOWER EXTREMITY MMT:   MMT Right eval R 3/11  Left eval L 3/11  Hip flexion 34.3 52.1 35.6 49.1  Hip extension        Hip abduction 47.0 53.5 41.7 53.6  Hip adduction        Hip internal rotation        Hip external rotation        Knee flexion        Knee extension 44.3 62.5 32.6 65.3  Ankle dorsiflexion  Ankle plantarflexion        Ankle inversion        Ankle eversion         (Blank rows = not tested)   TODAY'S TREATMENT:                                                                                                                               DATE:  3/11/224  Nu step Lvl 9 warm up 6 min UE and LE  Performance testing; edu of results, POC  Exercises - Gastroc Stretch on Wall  - 2 x daily - 7 x weekly - 1 sets - 3 reps - 30 hold - 2" box heel tap 2x10 2" box calf stretch 30s 2x  01/20/23: Pt seen for aquatic therapy today.  Treatment took place in water 3.2-4.75 ft in depth at the [facility] pool. Temp of water was 91.  Pt entered/exited the pool via stairs with bilat rail with supervision.  Therapist on deck entire session.   - without support forward/ backward gait, marching forward/backward - with orange weighted ball - step R/L and reach , both hands onball) x 5 each L/R; gentle trunk rotation with both hands on ball - side stepping L/R with arm abdct/addct - L stretch at wall - SLS with 2 fingers on wall x 10s each x 2 - holding wall:  wall push ups x 8;   - semi tandem stance with horiz head turns - high knee marching forward;  forward walking kick (like soccer ball) (one width each) -holding wall:  3 way toe tap x 5 each - holding yellow noodle:  hip abdct/addct x 10 each, forward gait with increased speed - R/L hamstring stretch with foot on 2nd step x 10-15s x 2  2/26 Nu step Lvl 9 warm up 6 min UE and LE  Outdoor walking on uneven surfaces/sloped surfaces, step/stair management/sequencing; AD usage/assist positions from spouse with outdoor ambulation; preventing freezing/knees  locking by dropping COG  Exercises - Gastroc Stretch on Wall  - 2 x daily - 7 x weekly - 1 sets - 3 reps - 30 hold - Heel Raise/Toe Raise  - 1 x daily - 7 x weekly - 2 sets - 10 reps - Standing March with Counter Support  - 1 x daily - 7 x weekly - 2 sets - 10 reps - Sit to Stand with Armchair  - 1 x daily - 7 x weekly - 3 sets - 5 reps  01/13/23: Pt seen for aquatic therapy today.  Treatment took place in water 3.2-4.75 ft in depth at the [employer] pool. Temp of water was 91.  Pt entered/exited the pool via stairs with bilat rail with supervision.  Therapist on deck entire session.   - without support forward/ backward gait, marching forward/backward - with orange weighted ball - Lt step and pass it forward to wife x 8;  step and reach (crossing  midline, both hands onball) x 5 each L/R; gentle trunk rotation with both hands on ball - side stepping L/R  - L stretch at wall - SLS with 2 fingers on wall x 10s each x 2 - holding wall:  heel raises x 10, hip ext to toe touch x 10 each - holding yellow noodle:  hip abdct/addct x 10 each, high knee marching forward; forward/backward gait with increased speed - SLS with 3 way toe tap x 5 each with yellow noodle - L stretch at stairs  2/26 Nu step Lvl 9 warm up 6 min UE and LE  Obstacle course with and without SPC (step over, around, and pliant surfaces) 6x  Wobble board 2x20 each ML and AP taps Retro and semi-tandem walk long silver bar 4x laps each Standing HR/TR with UE support 20x   PATIENT EDUCATION:  Education details: exam findings, muscle firing,  balance/falls prevention, envelope of function, HEP, POC  Person educated: Patient Education method: Explanation, Demonstration, Tactile cues, Verbal cues Education comprehension: verbalized understanding, returned demonstration, verbal cues required, tactile cues required, and needs further education  HOME EXERCISE PROGRAM:  Access Code: [email or web address] URL:  [email or web address] Date: 01/17/2023 Prepared by: Daleen Bo  ASSESSMENT:  CLINICAL IMPRESSION:  Pt demonstrates significant improvement with LE strength testing with nearly doubling knee extension strength on the L. Pt also has improved with FOTO outcome measure as well as Oceanographer. Pt is making steady progress but given L sided stroke impairments, discussed with pt the continuation of therapy likely to end at end of this POC. Pt visits extended by 10 visits to focus more on dynamic land based balance and obstacle course/variable surface training for falls prevention. Pt is making good progress with therapy at a slow and steady rate. Pt would benefit from continued skilled therapy in order to reach goals and maximize functional LE strength and balance for prevention of future falls and further functional decline.    OBJECTIVE IMPAIRMENTS: Abnormal gait, decreased activity tolerance, decreased knowledge of use of DME, decreased mobility, difficulty walking, decreased ROM, decreased strength, decreased safety awareness, increased fascial restrictions, increased muscle spasms, impaired flexibility, postural dysfunction, and pain.   ACTIVITY LIMITATIONS: carrying, lifting, bending, standing, squatting, stairs, transfers, bed mobility, reach over head, and locomotion level  PARTICIPATION LIMITATIONS: meal prep, cleaning, laundry, driving, shopping, community activity, and yard work  PERSONAL FACTORS: 3+ comorbidities: Low back Surgery, frequent falls; Past CVA   are also affecting patient's functional outcome.   REHAB POTENTIAL: Good  CLINICAL DECISION MAKING: Evolving/moderate complexity increasing falls  EVALUATION COMPLEXITY: Moderate   GOALS: Goals reviewed with patient? Yes  SHORT TERM GOALS: Target date: 12/10/2022   Patient will decrease 5x sit to stand time by 10 seconds and before all 5 reps.  Baseline: Goal status: ongoing; able to complete 5  2.  Patient  will increase left quad and glut strength by 5 lbs  Baseline:  Goal status: MET  3.  Patient will transfer sit to stand with SBA and decreased use of his hands  Baseline:  Goal status: met   LONG TERM GOALS: Target date: 04/18/2023+    Patient will have no major falls over a month  Baseline:  Goal status: ongoing  2.  Patients will score > 42 on the BERG to show a decreased fall risk  Baseline:  Goal status: ongoing  3.  Patient will return to a full gym program  Baseline:  Goal status: MET  4.  Pt will be able  to demonstrate ability to walk up and down ramps without freezing/fear of falling  in order to demonstrate functional improvement in balance/function for self-care and community ambulation   Baseline:  Goal status: Initial   PLAN:  PT FREQUENCY: 1x  PT DURATION: 12 wks (aim for D/C in 8)  PLANNED INTERVENTIONS: Therapeutic exercises, Therapeutic activity, Neuromuscular re-education, Balance training, Gait training, Patient/Family education, Self Care, Joint mobilization, Stair training, DME instructions, Aquatic Therapy, Spinal mobilization, Cryotherapy, Moist heat, Ultrasound, and Manual therapy  PLAN FOR NEXT SESSION:  outdoor balance as weather allows  Daleen Bo PT, DPT 01/24/23 2:50 PM

## 2023-01-27 ENCOUNTER — Ambulatory Visit (HOSPITAL_BASED_OUTPATIENT_CLINIC_OR_DEPARTMENT_OTHER): Payer: Medicare Other | Admitting: Physical Therapy

## 2023-01-27 ENCOUNTER — Encounter (HOSPITAL_BASED_OUTPATIENT_CLINIC_OR_DEPARTMENT_OTHER): Payer: Self-pay

## 2023-01-31 ENCOUNTER — Encounter (HOSPITAL_BASED_OUTPATIENT_CLINIC_OR_DEPARTMENT_OTHER): Payer: Self-pay | Admitting: Physical Therapy

## 2023-01-31 ENCOUNTER — Encounter: Payer: Self-pay | Admitting: Podiatry

## 2023-01-31 ENCOUNTER — Ambulatory Visit (INDEPENDENT_AMBULATORY_CARE_PROVIDER_SITE_OTHER): Payer: Medicare Other | Admitting: Podiatry

## 2023-01-31 ENCOUNTER — Ambulatory Visit (HOSPITAL_BASED_OUTPATIENT_CLINIC_OR_DEPARTMENT_OTHER): Payer: Medicare Other | Admitting: Physical Therapy

## 2023-01-31 DIAGNOSIS — M79674 Pain in right toe(s): Secondary | ICD-10-CM

## 2023-01-31 DIAGNOSIS — M5459 Other low back pain: Secondary | ICD-10-CM

## 2023-01-31 DIAGNOSIS — M79675 Pain in left toe(s): Secondary | ICD-10-CM | POA: Diagnosis not present

## 2023-01-31 DIAGNOSIS — R296 Repeated falls: Secondary | ICD-10-CM | POA: Diagnosis not present

## 2023-01-31 DIAGNOSIS — I639 Cerebral infarction, unspecified: Secondary | ICD-10-CM | POA: Diagnosis not present

## 2023-01-31 DIAGNOSIS — B351 Tinea unguium: Secondary | ICD-10-CM | POA: Diagnosis not present

## 2023-01-31 DIAGNOSIS — M6281 Muscle weakness (generalized): Secondary | ICD-10-CM

## 2023-01-31 NOTE — Progress Notes (Unsigned)
HPI :  72 year old male accompanied by his wife today for follow-up for oropharyngeal dysphagia, history of short segment Barrett's esophagus noted on EGD in 2017 and stable on most recent EGD in March 2020.   He has been on Nexium 40 mg once daily since have seen him (prescription actually says twice daily but he takes it once).  He reports good control of his reflux symptoms on the regimen and is happy with it.  Recall the patient has a history of what he says of 3 strokes in the past.  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  He he is on 81 mg/day aspirin.  He has been dealing with dysphagia now for some time.  When I asked him where he localizes this it seems more in his throat or upper chest.  His wife endorses that he coughs a lot when she eats meals with him, particularly at dinner.  He has been able to swallow his pills down okay but does have difficulty swallowing his foods.  He was seen in our office in December by Vicie Mutters.  He underwent an initial barium swallow and aspiration was observed at the very beginning of the exam, so it was aborted.  He had a follow-up modified barium swallow performed on January 17 with result outlined below.  There was laryngal penetration with aspiration, speech pathology suspecting oropharyngeal etiology.  He is stable, he is not losing weight.  He does not have history of aspiration pneumonia that I can gather.  We discussed options moving forward.  He would want to avoid invasive procedures such as EGD if possible.     Prior workup: EGD 01/29/2016 - 2cm hiatal hernia, irregular z-line, mild gastritis - path c/w BE without dysplasia, stable from last exam, biopsies negative for HP Colonoscopy 12/20/2014 - 2 small adenomas, diverticulosis -  EGD 01/25/2013 - irregular z-line Gastric emptying study 02/19/2009 - delayed gastric emptying MRI abdomen 10/15/2008 - simple renal cysts.    EGD 01/26/19 -  - The Z-line was irregular and was found 41 cm from  the incisors, with a few small islands of salmon colored mucosa extending upwards of 1cm above the GEJ. Biopsies were taken with a cold forceps for histology. - The exam of the esophagus was otherwise normal. - Patchy mildly erythematous mucosa was found in the gastric body and in the gastric antrum. Biopsies were taken with a cold forceps for Helicobacter pylori testing. - The exam of the stomach was otherwise normal. - The duodenal bulb and second portion of the duodenum were normal.     1. Surgical [P], gastric antrum and gastric body - ANTRAL MUCOSA WITH REACTIVE GASTROPATHY. - OXYNTIC MUCOSA WITH MILD HYPEREMIA. - WARTHIN-STARRY STAIN NEGATIVE FOR HELICOBACTER PYLORI. - NO INTESTINAL METAPLASIA, DYSPLASIA OR MALIGNANCY. 2. Surgical [P], distal esophagus - GASTROESOPHAGEAL MUCOSA WITH INTESTINAL METAPLASIA CONSISTENT WITH BARRETT'S ESOPHAGUS. - NO DYSPLASIA OR MALIGNANCY.   Echo 02/23/20 - EF 60-65%, mod LVH    Barium swallow 11/02/22: IMPRESSION: Aspiration was observed at the beginning of the examination, and the examination was terminated. Only very limited imaging of the esophagus was acquired. A modified barium swallow study with a speech pathologist is recommended for further evaluation.  Modified barium swallow 12/02/22: IMPRESSION: Laryngeal penetration and aspiration of thin barium.   Please refer to the Speech Pathologists report for complete details and recommendations.    Past Medical History:  Diagnosis Date   Barrett's esophagus    Chronic back pain  Depression    Gastritis    Mild   Gastroparesis    GERD (gastroesophageal reflux disease)    Headache    History of kidney stones    Hyperglycemia    Hypertension    Nephrolithiasis    Pneumonia    Covid pneumonia   Renal cyst    Sleep apnea    Stricture and stenosis of esophagus    Stroke ([place]) 07/2015   Vision abnormalities      Past Surgical History:  Procedure Laterality Date    CERVICAL [place] SURGERY  2012   COLONOSCOPY     LUMBAR [place] SURGERY  2005, 2010   replaced L4 and L5   SHOULDER ARTHROSCOPY WITH ROTATOR CUFF REPAIR Left 08/14/2020   Procedure: SHOULDER ARTHROSCOPY WITH ROTATOR CUFF REPAIR, SUBACROMIAL DECOMPRESSION;  Surgeon: Tania Ade, MD;  Location: [facility];  Service: Orthopedics;  Laterality: Left;   UPPER GASTROINTESTINAL ENDOSCOPY     Family History  Problem Relation Age of Onset   Hypertension Father    Stroke Father    Hypertension Mother    Liver cancer Mother    Hypertension Sister        2 other sisters has also   Stroke Sister    Stroke Sister    Hypertension Brother        2nd brother has also   Colon cancer Neg Hx    Esophageal cancer Neg Hx    Rectal cancer Neg Hx    Stomach cancer Neg Hx    Colon polyps Neg Hx    Social History   Tobacco Use   Smoking status: Former    Packs/day: 0.50    Years: 30.00    Additional pack years: 0.00    Total pack years: 15.00    Types: Cigarettes    Quit date: 04/15/2018    Years since quitting: 4.8   Smokeless tobacco: Never  Vaping Use   Vaping Use: Never used  Substance Use Topics   Alcohol use: No    Alcohol/week: 0.0 standard drinks of alcohol   Drug use: Never    Comment: last use 04/2018   Current Outpatient Medications  Medication Sig Dispense Refill   amLODipine (NORVASC) 2.5 MG tablet Take 1 tablet (2.5 mg total) by mouth at bedtime. 90 tablet 2   aspirin EC 81 MG tablet Take 1 tablet (81 mg total) by mouth daily. Swallow whole. 30 tablet 11   DULoxetine (CYMBALTA) 60 MG capsule Take 1 capsule (60 mg total) by mouth daily. 90 capsule 1   esomeprazole (NEXIUM) 40 MG capsule Take 1 capsule (40 mg total) by mouth 2 (two) times daily before a meal. 180 capsule 1   lovastatin (MEVACOR) 20 MG tablet 1 tab every other day. 45 tablet 1   olmesartan (BENICAR) 20 MG tablet Take 20 mg by mouth daily.     Oxycodone HCl 10 MG TABS Take 1 tablet (10 mg total) by mouth 2 (two) times  daily. 60 tablet 0   traZODone (DESYREL) 50 MG tablet Take 0.5 tablets (25 mg total) by mouth at bedtime as needed for sleep. 45 tablet 2   No current facility-administered medications for this visit.   No Known Allergies   Review of Systems: All systems reviewed and negative except where noted in HPI.    No results found.  Physical Exam: BP 122/74   Pulse 68   Ht 6\' 2"  (1.88 m)   Wt 235 lb (106.6 kg)   BMI 30.17  kg/m  Constitutional: Pleasant,well-developed, male in no acute distress. Psychiatric: Normal mood and affect. Behavior is normal.   ASSESSMENT: 72 y.o. male here for assessment of the following  1. Oropharyngeal dysphagia   2. Barrett's esophagus without dysplasia   3. Long-term current use of proton pump inhibitor therapy    Patient with ongoing oropharyngeal dysphagia likely due to CVA.  His prior EGD showed no obvious stenosis, he clearly aspirates on barium swallow and modified barium swallow and I think this is the cause of his symptoms.  We discussed this for a bit.  He needs to follow-up with speech pathology.  Apparently they had the wrong contact information has not been able to schedule him for that.  We updated his contact information and contacted speech pathology so he can coordinate follow-up with them.  We discussed the only other thing we could offer was an EGD with empiric dilation in case there is any subtle stenosis at the UES which could be helped with dilation.  We discussed risks of that and of anesthesia, he understands and wants to hold off for now.  He currently would not be due for surveillance endoscopy until March 2025 for his history of Barrett's.  This is a very low risk segment.  We can discuss at that time if he wants to proceed or not.  We did discuss long-term risk benefits of chronic PPI use, he understands, will continue present dosing given his history of Barrett's.   PLAN: - help coordinate f/u with speech path for oropharyngeal  dysphagia - offered empiric EGD with dilation, think likely lower yield, they want to hold off for now - consideration for surveillance EGD in 12/2023 - reassured them low risk BE segment - continue current dosing of nexium once daily - discussed long term risks / benefits, think benefits > risks - f/u one year or sooner with issues  Jolly Mango, MD [facility]

## 2023-01-31 NOTE — Therapy (Signed)
OUTPATIENT PHYSICAL THERAPY LOWER EXTREMITY TREATMENT     Patient Name: Richard Vaughn MRN: [id number] DOB:[birth year], 72 y.o., male Today's Date: 01/31/2023  END OF SESSION:  PT End of Session - 01/31/23 1414     Visit Number 21    Number of Visits 30    Date for PT Re-Evaluation 04/24/23    Authorization Type MCR    PT Start Time 1410   arrives late   PT Stop Time [id number]    PT Time Calculation (min) 35 min    Behavior During Therapy [facility] for tasks assessed/performed                   Past Medical History:  Diagnosis Date   Barrett's esophagus    Chronic back pain    Depression    Gastritis    Mild   Gastroparesis    GERD (gastroesophageal reflux disease)    Headache    History of kidney stones    Hyperglycemia    Hypertension    Nephrolithiasis    Pneumonia    Covid pneumonia   Renal cyst    Sleep apnea    Stricture and stenosis of esophagus    Stroke ([place]) 07/2015   Vision abnormalities    Past Surgical History:  Procedure Laterality Date   CERVICAL [place] SURGERY  2012   COLONOSCOPY     LUMBAR [place] SURGERY  2005, 2010   replaced L4 and L5   SHOULDER ARTHROSCOPY WITH ROTATOR CUFF REPAIR Left 08/14/2020   Procedure: SHOULDER ARTHROSCOPY WITH ROTATOR CUFF REPAIR, SUBACROMIAL DECOMPRESSION;  Surgeon: Tania Ade, MD;  Location: [facility];  Service: Orthopedics;  Laterality: Left;   UPPER GASTROINTESTINAL ENDOSCOPY     Patient Active Problem List   Diagnosis Date Noted   Drug-induced constipation 06/08/2022   Hemiparesis affecting left side as late effect of cerebrovascular accident ([place]) 12/09/2021   Depression, major, recurrent, mild ([place])    Sleep disturbance    Urinary retention    Bradycardia    Essential hypertension    Temperature elevated    Left thyroid nodule    Treatment-emergent central sleep apnea 10/25/2018   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) 10/25/2018   Chronic pain syndrome    Chronic bilateral  low back pain    Benign essential HTN    Tobacco abuse    Hyperlipidemia    History of CVA with residual deficit    Dysphagia, post-stroke    ICH (intracerebral hemorrhage) ([place]) - R thalmic/PLIC d/t HTN [id number]   Insomnia 07/13/2017   PAD (peripheral artery disease) ([place]) 07/13/2017   Hyperlipidemia with target LDL less than 70 07/13/2017   Stroke-like symptom 09/22/2016   Paresthesia 09/22/2016   CVA (cerebral vascular accident) ([place]) 09/22/2016   Cerebrovascular accident (CVA) due to thrombosis of left carotid artery ([place]) 09/23/2015   Primary snoring 09/23/2015   Hypersomnia with sleep apnea 09/23/2015   Obstructive sleep apnea 09/23/2015   Lacunar infarct, acute ([place]) 08/25/2015   Sleep apnea 08/25/2015   Dysarthria    CVD (cardiovascular disease) 08/02/2015   Acute hyperglycemia 0000000   Systolic hypertension with cerebrovascular disease 12/27/2014   Epistaxis 07/28/2012   NEPHROLITHIASIS 05/23/2009   BARRETTS ESOPHAGUS 02/20/2009   Gastroparesis 02/20/2009   RADICULOPATHY 10/21/2008   GERD 10/08/2008   ESOPHAGITIS 08/15/2008   ESOPHAGEAL STRICTURE 08/15/2008   GASTRITIS, ACUTE 08/15/2008   RENAL CYST 08/14/2008   TOBACCO ABUSE 08/08/2008   HEMATURIA, MICROSCOPIC, HX OF 08/08/2008  PULMONARY NODULE 01/10/2008    PCP: Dr Betty Martinique   REFERRING PROVIDER: Dr Betty Martinique   REFERRING DIAG:  Diagnosis  R29.6 (ICD-10-CM) - Frequent falls    THERAPY DIAG:  Muscle weakness (generalized)  Repeated falls  Other low back pain  Rationale for Evaluation and Treatment: Rehabilitation  ONSET DATE: increasing number of falls over the past few weeks  SUBJECTIVE:   SUBJECTIVE STATEMENT:  Pt states that he did have a freezing moment while walking at the park. No falls since last session.   Eval: The patient has a long history of balance disturbances following a stroke in 2016. He reports over the past several months the balance deficits have increased. He  has had 4 major falls. 3 of which were falling backwards. The other fall was from tripping over an object. He is using a cane right now 2nd to decreased ability to use his left arm.   PERTINENT HISTORY: Depression, chronic low back pain, Lumbar surgery 2005 , 2010 ; RTC repair 2021; CVA, HTN, PNA, Stroke 2016. Stroke effected Left UE,   PAIN:  Are you having pain? Yes: NPRS scale: 5/10 Pain location: middle of his lower back  Pain description: aching  Aggravating factors: bending  Relieving factors: rest   PRECAUTIONS: Fall  WEIGHT BEARING RESTRICTIONS: No  FALLS:  Has patient fallen in last 6 months? Yes. 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 a week  PLOF: Independent with use of a cane   PATIENT GOALS:  To improve balance   OBJECTIVE:   FOTO 42 pts 2/6  3/11 FOTO 45   BERG Balance Test                                              Date: 3/11   Sit to Stand 3  Standing unsupported 4  Sitting with back unsupported but feet supported 4  Stand to sit  3  Transfers  3  Standing unsupported with eyes closed 4  Standing unsupported feet together 4  From standing position, reach forward with outstretched arm 3  From standing position, pick up object from floor 4  From standing position, turn and look behind over each shoulder 4  Turn 360 3  Standing unsupported, alternately place foot on step 1  Standing unsupported, one foot in front 3  Standing on one leg 1  Total:  41/56     5xSTS: 45.2s  with UE assist  STS Transfers: improved fwd trunk lean during transition, reduction of posterior LOB, able to come to full standing independently  LOWER EXTREMITY MMT:   MMT Right eval R 3/11 Left eval L 3/11  Hip flexion 34.3 52.1 35.6 49.1  Hip extension        Hip abduction 47.0 53.5 41.7 53.6  Hip adduction        Hip internal rotation        Hip external  rotation        Knee flexion        Knee extension 44.3 62.5 32.6 65.3  Ankle dorsiflexion        Ankle plantarflexion        Ankle inversion        Ankle eversion         (  Blank rows = not tested)   TODAY'S TREATMENT:                                                                                                                               DATE:  3/11/224   Nu step Lvl 9 warm up 6 min UE and LE  Exercises- gait belt on throughout session Outdoor walking on uneven surfaces/sloped surfaces, step/stair management/sequencing;  [place] and down hill on grass 2x laps 5x curb management   3/11/224  Nu step Lvl 9 warm up 6 min UE and LE  Performance testing; edu of results, POC  Exercises - Gastroc Stretch on Wall  - 2 x daily - 7 x weekly - 1 sets - 3 reps - 30 hold - 2" box heel tap 2x10 2" box calf stretch 30s 2x  01/20/23: Pt seen for aquatic therapy today.  Treatment took place in water 3.2-4.75 ft in depth at the [facility] pool. Temp of water was 91.  Pt entered/exited the pool via stairs with bilat rail with supervision.  Therapist on deck entire session.   - without support forward/ backward gait, marching forward/backward - with orange weighted ball - step R/L and reach , both hands onball) x 5 each L/R; gentle trunk rotation with both hands on ball - side stepping L/R with arm abdct/addct - L stretch at wall - SLS with 2 fingers on wall x 10s each x 2 - holding wall:  wall push ups x 8;   - semi tandem stance with horiz head turns - high knee marching forward;  forward walking kick (like soccer ball) (one width each) -holding wall:  3 way toe tap x 5 each - holding yellow noodle:  hip abdct/addct x 10 each, forward gait with increased speed - R/L hamstring stretch with foot on 2nd step x 10-15s x 2  2/26 Nu step Lvl 9 warm up 6 min UE and LE  Outdoor walking on uneven surfaces/sloped surfaces, step/stair management/sequencing; AD usage/assist  positions from spouse with outdoor ambulation; preventing freezing/knees locking by dropping COG  Exercises - Gastroc Stretch on Wall  - 2 x daily - 7 x weekly - 1 sets - 3 reps - 30 hold - Heel Raise/Toe Raise  - 1 x daily - 7 x weekly - 2 sets - 10 reps - Standing March with Counter Support  - 1 x daily - 7 x weekly - 2 sets - 10 reps - Sit to Stand with Armchair  - 1 x daily - 7 x weekly - 3 sets - 5 reps  01/13/23: Pt seen for aquatic therapy today.  Treatment took place in water 3.2-4.75 ft in depth at the [facility] pool. Temp of water was 91.  Pt entered/exited the pool via stairs with bilat rail with supervision.  Therapist on deck entire session.   - without support forward/ backward gait, marching forward/backward - with orange weighted ball -  Lt step and pass it forward to wife x 8;  step and reach (crossing midline, both hands onball) x 5 each L/R; gentle trunk rotation with both hands on ball - side stepping L/R  - L stretch at wall - SLS with 2 fingers on wall x 10s each x 2 - holding wall:  heel raises x 10, hip ext to toe touch x 10 each - holding yellow noodle:  hip abdct/addct x 10 each, high knee marching forward; forward/backward gait with increased speed - SLS with 3 way toe tap x 5 each with yellow noodle - L stretch at stairs  2/26 Nu step Lvl 9 warm up 6 min UE and LE  Obstacle course with and without SPC (step over, around, and pliant surfaces) 6x  Wobble board 2x20 each ML and AP taps Retro and semi-tandem walk long silver bar 4x laps each Standing HR/TR with UE support 20x   PATIENT EDUCATION:  Education details:  balance/falls prevention, envelope of function, HEP, POC  Person educated: Patient Education method: Explanation, Demonstration, Tactile cues, Verbal cues Education comprehension: verbalized understanding, returned demonstration, verbal cues required, tactile cues required, and needs further education  HOME EXERCISE  PROGRAM:  Access Code: [email or web address] URL: [email or web address] Date: 01/17/2023 Prepared by: Daleen Bo  ASSESSMENT:  CLINICAL IMPRESSION:  Pt able to continue with progression of dynamic balance in sloped and uneven surfaces. Pt's L LE quickly fatigues with increase in toe drag with repetition and increased duration of walking. Pt did have LOB while outside due to fatigue and catching of the L foot on the curb. Pt did not hit the ground. PT kept pt from falling with use of gait belt. No injuries and pt reports no issues with session. Pt advised to continue with gym based exercise to prevent decline of L LE. Pt advised to work strongly on DF based exercise as well as hip flexion. Plan to continue with dynamic balance as tolerated. Pt would benefit from continued skilled therapy in order to reach goals and maximize functional LE strength and balance for prevention of future falls and further functional decline.    OBJECTIVE IMPAIRMENTS: Abnormal gait, decreased activity tolerance, decreased knowledge of use of DME, decreased mobility, difficulty walking, decreased ROM, decreased strength, decreased safety awareness, increased fascial restrictions, increased muscle spasms, impaired flexibility, postural dysfunction, and pain.   ACTIVITY LIMITATIONS: carrying, lifting, bending, standing, squatting, stairs, transfers, bed mobility, reach over head, and locomotion level  PARTICIPATION LIMITATIONS: meal prep, cleaning, laundry, driving, shopping, community activity, and yard work  PERSONAL FACTORS: 3+ comorbidities: Low back Surgery, frequent falls; Past CVA   are also affecting patient's functional outcome.   REHAB POTENTIAL: Good  CLINICAL DECISION MAKING: Evolving/moderate complexity increasing falls  EVALUATION COMPLEXITY: Moderate   GOALS: Goals reviewed with patient? Yes  SHORT TERM GOALS: Target date: 12/10/2022   Patient will decrease 5x sit to stand time by 10 seconds  and before all 5 reps.  Baseline: Goal status: ongoing; able to complete 5  2.  Patient will increase left quad and glut strength by 5 lbs  Baseline:  Goal status: MET  3.  Patient will transfer sit to stand with SBA and decreased use of his hands  Baseline:  Goal status: met   LONG TERM GOALS: Target date: 04/25/2023+    Patient will have no major falls over a month  Baseline:  Goal status: ongoing  2.  Patients will score > 42 on the BERG to show a decreased fall risk  Baseline:  Goal status: ongoing  3.  Patient will return to a full gym program  Baseline:  Goal status: MET  4.  Pt will be able to demonstrate ability to walk up and down ramps without freezing/fear of falling  in order to demonstrate functional improvement in balance/function for self-care and community ambulation   Baseline:  Goal status: Initial   PLAN:  PT FREQUENCY: 1x  PT DURATION: 12 wks (aim for D/C in 8)  PLANNED INTERVENTIONS: Therapeutic exercises, Therapeutic activity, Neuromuscular re-education, Balance training, Gait training, Patient/Family education, Self Care, Joint mobilization, Stair training, DME instructions, Aquatic Therapy, Spinal mobilization, Cryotherapy, Moist heat, Ultrasound, and Manual therapy  PLAN FOR NEXT SESSION:  outdoor balance as weather allows  Daleen Bo PT, DPT 01/31/23 3:24 PM

## 2023-01-31 NOTE — Progress Notes (Signed)
This patient returns to my office for at risk foot care.  This patient requires this care by a professional since this patient will be at risk due to having  CVA, PAD and hyperglycemia. He presents to the office with his wife.  This patient is unable to cut nails himself since the patient cannot reach his nails.These nails are painful walking and wearing shoes.  This patient presents for at risk foot care today.  General Appearance  Alert, conversant and in no acute stress.  Vascular  Dorsalis pedis and posterior tibial  pulses are palpable  bilaterally.  Capillary return is within normal limits  bilaterally. Temperature is within normal limits  bilaterally.  Neurologic  Senn-Weinstein monofilament wire test within normal limits  bilaterally. Muscle power within normal limits bilaterally.  Nails Thick disfigured discolored nails with subungual debris  from hallux to fifth toes bilaterally. No evidence of bacterial infection or drainage bilaterally.  Orthopedic  No limitations of motion  feet .  No crepitus or effusions noted.  Hammer toes  B/l.  Skin  normotropic skin with no porokeratosis noted bilaterally.  No signs of infections or ulcers noted.     Onychomycosis  Pain in right toes  Pain in left toes  Consent was obtained for treatment procedures.   Mechanical debridement of nails 1-5  bilaterally performed with a nail nipper.  Filed with dremel without incident.    Return office visit    10 weeks                 Told patient to return for periodic foot care and evaluation due to potential at risk complications.   Sudie Bandel DPM   

## 2023-02-01 ENCOUNTER — Telehealth: Payer: Self-pay

## 2023-02-01 ENCOUNTER — Ambulatory Visit (INDEPENDENT_AMBULATORY_CARE_PROVIDER_SITE_OTHER): Payer: Medicare Other | Admitting: Gastroenterology

## 2023-02-01 ENCOUNTER — Encounter: Payer: Self-pay | Admitting: Gastroenterology

## 2023-02-01 VITALS — BP 122/74 | HR 68 | Ht 74.0 in | Wt 235.0 lb

## 2023-02-01 DIAGNOSIS — R1312 Dysphagia, oropharyngeal phase: Secondary | ICD-10-CM | POA: Diagnosis not present

## 2023-02-01 DIAGNOSIS — Z79899 Other long term (current) drug therapy: Secondary | ICD-10-CM | POA: Diagnosis not present

## 2023-02-01 DIAGNOSIS — K2270 Barrett's esophagus without dysplasia: Secondary | ICD-10-CM | POA: Diagnosis not present

## 2023-02-01 NOTE — Telephone Encounter (Signed)
Called and left detailed message for Speech Path at [phone] that patient's number has been updated in Epic and to please call patient to schedule. [phone]

## 2023-02-01 NOTE — Progress Notes (Signed)
HPI: [name] is a 72 y.o. male, who is here today with his wife for chronic disease management. Last seen on 11/03/22 No new problems since his last visit.  He reports no new health concerns since his last visit. Depression: He is currently taking Cymbalta 60 mg daily, which has been effective in improving mood.     02/02/2023    2:34 PM 11/03/2022    3:19 PM 06/08/2022    3:15 PM 04/21/2022   10:22 AM 01/29/2022    2:35 PM  Depression screen PHQ 2/9  Decreased Interest 0 0 2 0 1  Down, Depressed, Hopeless 0 1 1 0 1  PHQ - 2 Score 0 1 3 0 2  Altered sleeping 0 1 1  0  Tired, decreased energy 0 0 0  0  Change in appetite 0 0 0  1  Feeling bad or failure about yourself  0 2 0  0  Trouble concentrating 0 0 0  0  Moving slowly or fidgety/restless 0 0 0  0  Suicidal thoughts 0 1 0  0  PHQ-9 Score 0 5 4  3   Difficult doing work/chores Not difficult at all Somewhat difficult Not difficult at all  Not difficult at all   Additionally, he is taking Trazodone 50 mg at bedtime to aid in sleep and has reported satisfactory sleep quality without residual drowsiness. Sleeping about 7-8 hours.  Pain management continues with Oxycodone 10 mg bid prn, which remains effective without the onset of new side effects. He notes an improvement in previously reported constipation, with stool consistency now softer. He rates his pain level as a 5-6/10 while on medication.   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   Hypertension:  Medications:Amlodipine 2.5 mg daily and Olmesartan 20 mg daily. BP's 120's/70's when he checks BP at home. Negative for unusual or severe headache, visual changes, exertional chest pain, dyspnea, new focal weakness, or edema.  Lab Results  Component Value Date   CREATININE 0.98 07/13/2022   BUN 10 07/13/2022   NA 141 07/13/2022   K 3.9 07/13/2022   CL 104 07/13/2022   CO2  24 07/13/2022  His last eye examination was conducted one year ago.  Review of Systems  Constitutional:  Positive for fatigue. Negative for chills and fever.  Respiratory:  Negative for choking and wheezing.   Gastrointestinal:  Negative for abdominal pain, nausea and vomiting.  Genitourinary:  Negative for decreased urine volume, dysuria and hematuria.  Skin:  Negative for rash.  Neurological:  Negative for syncope.  See other pertinent positives and negatives in HPI.  Current Outpatient Medications on File Prior to Visit  Medication Sig Dispense Refill   amLODipine (NORVASC) 2.5 MG tablet Take 1 tablet (2.5 mg total) by mouth at bedtime. 90 tablet 2   aspirin EC 81 MG tablet Take 1 tablet (81 mg total) by mouth daily. Swallow whole. 30 tablet 11   DULoxetine (CYMBALTA) 60 MG capsule Take 1 capsule (60 mg total) by mouth daily. 90 capsule 1   esomeprazole (NEXIUM) 40 MG capsule Take 1 capsule (40 mg total) by mouth 2 (two) times daily before a meal. 180 capsule 1   lovastatin (MEVACOR) 20 MG tablet 1 tab every other day. 45 tablet 1   olmesartan (BENICAR) 20 MG tablet Take 20 mg by mouth daily.     traZODone (DESYREL) 50 MG tablet Take  0.5 tablets (25 mg total) by mouth at bedtime as needed for sleep. 45 tablet 2   No current facility-administered medications on file prior to visit.   Past Medical History:  Diagnosis Date   Barrett's esophagus    Chronic back pain    Depression    Gastritis    Mild   Gastroparesis    GERD (gastroesophageal reflux disease)    Headache    History of kidney stones    Hyperglycemia    Hypertension    Nephrolithiasis    Pneumonia    Covid pneumonia   Renal cyst    Sleep apnea    Stricture and stenosis of esophagus    Stroke ([place]) 07/2015   Vision abnormalities    No Known Allergies  Social History   Socioeconomic History   Marital status: Married    Spouse name: Richard Vaughn   Number of children: 3   Years of education: Not on file    Highest education level: 12th grade  Occupational History   Occupation: retired    Fish farm manager: [employer]  Tobacco Use   Smoking status: Former    Packs/day: 0.50    Years: 30.00    Additional pack years: 0.00    Total pack years: 15.00    Types: Cigarettes    Quit date: 04/15/2018    Years since quitting: 4.8   Smokeless tobacco: Never  Vaping Use   Vaping Use: Never used  Substance and Sexual Activity   Alcohol use: No    Alcohol/week: 0.0 standard drinks of alcohol   Drug use: Never    Comment: last use 04/2018   Sexual activity: Yes  Other Topics Concern   Not on file  Social History Narrative   Not on file   Social Determinants of Health   Financial Resource Strain: Low Risk  (04/21/2022)   Overall Financial Resource Strain (CARDIA)    Difficulty of Paying Living Expenses: Not hard at all  Food Insecurity: No Food Insecurity (06/04/2022)   Hunger Vital Sign    Worried About Running Out of Food in the Last Year: Never true    Ran Out of Food in the Last Year: Never true  Transportation Needs: No Transportation Needs (06/04/2022)   PRAPARE - Hydrologist (Medical): No    Lack of Transportation (Non-Medical): No  Physical Activity: Sufficiently Active (06/04/2022)   Exercise Vital Sign    Days of Exercise per Week: 5 days    Minutes of Exercise per Session: 60 min  Stress: Stress Concern Present (06/04/2022)   [place]    Feeling of Stress : To some extent  Social Connections: Moderately Integrated (06/04/2022)   Social Connection and Isolation Panel [NHANES]    Frequency of Communication with Friends and Family: Once a week    Frequency of Social Gatherings with Friends and Family: More than three times a week    Attends Religious Services: 1 to 4 times per year    Active Member of [employer] or Organizations: No    Attends Archivist Meetings: Never    Marital  Status: Married  Recent Concern: Social Connections - Moderately Isolated (04/21/2022)   Social Connection and Isolation Panel [NHANES]    Frequency of Communication with Friends and Family: More than three times a week    Frequency of Social Gatherings with Friends and Family: More than three times a week  Attends Religious Services: Never    Active Member of Clubs or Organizations: No    Attends Archivist Meetings: Never    Marital Status: Married   Vitals:   02/02/23 1424  BP: 128/70  Pulse: 79  Resp: 16  SpO2: 99%   Body mass index is 30.17 kg/m.  Physical Exam Vitals and nursing note reviewed.  Constitutional:      General: He is not in acute distress.    Appearance: He is well-developed.  HENT:     Head: Normocephalic and atraumatic.     Mouth/Throat:     Mouth: Mucous membranes are moist.  Eyes:     Conjunctiva/sclera: Conjunctivae normal.  Cardiovascular:     Rate and Rhythm: Normal rate and regular rhythm.     Heart sounds: No murmur heard.    Comments: DP pulses palpable. Trace pitting LE edema, bilateral. Pulmonary:     Effort: Pulmonary effort is normal. No respiratory distress.     Breath sounds: Normal breath sounds.  Abdominal:     Palpations: Abdomen is soft. There is no mass.     Tenderness: There is no abdominal tenderness.  Skin:    General: Skin is warm.     Findings: No erythema or rash.  Neurological:     Mental Status: He is alert and oriented to person, place, and time.     Comments: Left-sided weakness and spurred speech residual from CVA. Unstable gait assisted by a cane.  Psychiatric:        Mood and Affect: Mood and affect normal.   ASSESSMENT AND PLAN:  [name] was seen today for medical management of chronic issues.  Diagnoses and all orders for this visit: Lab Results  Component Value Date   CREATININE 0.95 02/02/2023   BUN 14 02/02/2023   NA 139 02/02/2023   K 4.1 02/02/2023   CL 105 02/02/2023   CO2 29  02/02/2023  Insomnia, unspecified type Assessment & Plan: Continue Trazodone 50 mg daily at bedtime, which is helping with sleep. Sleeping about 8 hours. He understands potential side effects and the risk for med interaction with Duloxetine. Good sleep hygiene to continue.   Chronic pain syndrome Assessment & Plan: On Oxycodone 10 mg bid prn, no changes today. Medication contract 05/2022. PMP reviewed. Follow-up in 5 months, before if needed. New Rx sent.  Orders: -     oxyCODONE HCl; Take 1 tablet (10 mg total) by mouth 2 (two) times daily. Next refill is due on 02/19/2023.  Dispense: 60 tablet; Refill: 0  Chronic bilateral low back pain with right-sided sciatica Assessment & Plan: Pain is adequately controlled with Oxycodone 10 mg bid. Also on Duloxetine 60 mg daily (for depression as well). Fall precautions and PT to continue.  Orders: -     oxyCODONE HCl; Take 1 tablet (10 mg total) by mouth 2 (two) times daily. Next refill is due on 02/19/2023.  Dispense: 60 tablet; Refill: 0  Systolic hypertension with cerebrovascular disease Assessment & Plan: BP adequately controlled. Continue Benicar 20 mg daily and Amlodipine 2.5 mg daily. Low salt diet. Eye exam is due.  Orders: -     Basic metabolic panel; Future  Hemiparesis affecting left side as late effect of cerebrovascular accident [facility]) Assessment & Plan: Stable. On Aspirin 81 mg daily and Lovastatin 20 mg daily. Attending PT. Fall precautions discussed.   Depression, major, recurrent, mild ([place]) Assessment & Plan: Problem is stable. Continue Duloxetine 60 mg daily.   Return in  about 5 months (around 07/05/2023) for chronic problems, Labs.  Sianni Cloninger G. [place], MD  [facility]. [place] office.

## 2023-02-01 NOTE — Patient Instructions (Addendum)
If your blood pressure at your visit was 140/90 or greater, please contact your primary care physician to follow up on this.  _______________________________________________________  If you are age 70 or older, your body mass index should be between 23-30. Your Body mass index is 30.17 kg/m. If this is out of the aforementioned range listed, please consider follow up with your Primary Care Provider.  If you are age 60 or younger, your body mass index should be between 19-25. Your Body mass index is 30.17 kg/m. If this is out of the aformentioned range listed, please consider follow up with your Primary Care Provider.   ________________________________________________________  The [facility] providers would like to encourage you to use [facility] to communicate with providers for non-urgent requests or questions.  Due to long hold times on the telephone, sending your provider a message by [facility] may be a faster and more efficient way to get a response.  Please allow 48 business hours for a response.  Please remember that this is for non-urgent requests.  _______________________________________________________  Due to recent changes in healthcare laws, you may see the results of your imaging and laboratory studies on MyChart before your provider has had a chance to review them.  We understand that in some cases there may be results that are confusing or concerning to you. Not all laboratory results come back in the same time frame and the provider may be waiting for multiple results in order to interpret others.  Please give [place] 48 hours in order for your provider to thoroughly review all the results before contacting the office for clarification of your results.    We will contact Speech Pathology and will give them your updated telephone number [phone]). Please let us know if you have not heard from them within the week.    Thank you for entrusting me with your care and for choosing [facility], Dr. [place] Cellar

## 2023-02-02 ENCOUNTER — Encounter: Payer: Self-pay | Admitting: Family Medicine

## 2023-02-02 ENCOUNTER — Ambulatory Visit (INDEPENDENT_AMBULATORY_CARE_PROVIDER_SITE_OTHER): Payer: Medicare Other | Admitting: Family Medicine

## 2023-02-02 VITALS — BP 128/70 | HR 79 | Resp 16 | Ht 74.0 in | Wt 235.0 lb

## 2023-02-02 DIAGNOSIS — F330 Major depressive disorder, recurrent, mild: Secondary | ICD-10-CM

## 2023-02-02 DIAGNOSIS — G4700 Insomnia, unspecified: Secondary | ICD-10-CM

## 2023-02-02 DIAGNOSIS — M5441 Lumbago with sciatica, right side: Secondary | ICD-10-CM

## 2023-02-02 DIAGNOSIS — G894 Chronic pain syndrome: Secondary | ICD-10-CM | POA: Diagnosis not present

## 2023-02-02 DIAGNOSIS — I69354 Hemiplegia and hemiparesis following cerebral infarction affecting left non-dominant side: Secondary | ICD-10-CM

## 2023-02-02 DIAGNOSIS — G8929 Other chronic pain: Secondary | ICD-10-CM

## 2023-02-02 DIAGNOSIS — I674 Hypertensive encephalopathy: Secondary | ICD-10-CM | POA: Diagnosis not present

## 2023-02-02 MED ORDER — OXYCODONE HCL 10 MG PO TABS: 10.0000 mg | ORAL_TABLET | Freq: Two times a day (BID) | ORAL | 0 refills | Status: DC

## 2023-02-02 NOTE — Assessment & Plan Note (Signed)
Pain is adequately controlled with Oxycodone 10 mg bid. Also on Duloxetine 60 mg daily (for depression as well). Fall precautions and PT to continue.

## 2023-02-02 NOTE — Assessment & Plan Note (Signed)
Continue Trazodone 50 mg daily at bedtime, which is helping with sleep. Sleeping about 8 hours. He understands potential side effects and the risk for med interaction with Duloxetine. Good sleep hygiene to continue.

## 2023-02-02 NOTE — Patient Instructions (Addendum)
A few things to remember from today's visit:  Insomnia, unspecified type  Chronic pain syndrome - Plan: Oxycodone HCl 10 MG TABS  Chronic bilateral low back pain with right-sided sciatica - Plan: Oxycodone HCl 10 MG TABS No changes today. I will see you back in 5 months, before if needed. Fasting labs next visit.   If you need refills for medications you take chronically, please call your pharmacy. Do not use My Chart to request refills or for acute issues that need immediate attention. If you send a my chart message, it may take a few days to be addressed, specially if I am not in the office.  Please be sure medication list is accurate. If a new problem present, please set up appointment sooner than planned today.

## 2023-02-02 NOTE — Assessment & Plan Note (Signed)
On Oxycodone 10 mg bid prn, no changes today. Medication contract 05/2022. PMP reviewed. Follow-up in 5 months, before if needed. New Rx sent.

## 2023-02-02 NOTE — Assessment & Plan Note (Signed)
BP adequately controlled. Continue Benicar 20 mg daily and Amlodipine 2.5 mg daily. Low salt diet. Eye exam is due.

## 2023-02-03 ENCOUNTER — Ambulatory Visit (HOSPITAL_BASED_OUTPATIENT_CLINIC_OR_DEPARTMENT_OTHER): Payer: Medicare Other | Admitting: Physical Therapy

## 2023-02-03 LAB — BASIC METABOLIC PANEL
BUN: 14 mg/dL (ref 6–23)
CO2: 29 mEq/L (ref 19–32)
Calcium: 9.1 mg/dL (ref 8.4–10.5)
Chloride: 105 mEq/L (ref 96–112)
Creatinine, Ser: 0.95 mg/dL (ref 0.40–1.50)
GFR: 81.81 mL/min (ref >60.00)
Glucose, Bld: 72 mg/dL (ref 70–99)
Potassium: 4.1 mEq/L (ref 3.5–5.1)
Sodium: 139 mEq/L (ref 135–145)

## 2023-02-05 NOTE — Assessment & Plan Note (Signed)
Problem is stable. Continue Duloxetine 60 mg daily.

## 2023-02-05 NOTE — Assessment & Plan Note (Signed)
Stable. On Aspirin 81 mg daily and Lovastatin 20 mg daily. Attending PT. Fall precautions discussed.

## 2023-02-07 ENCOUNTER — Ambulatory Visit (HOSPITAL_BASED_OUTPATIENT_CLINIC_OR_DEPARTMENT_OTHER): Payer: Medicare Other | Admitting: Physical Therapy

## 2023-02-10 ENCOUNTER — Ambulatory Visit: Payer: Medicare Other

## 2023-02-10 ENCOUNTER — Ambulatory Visit (HOSPITAL_BASED_OUTPATIENT_CLINIC_OR_DEPARTMENT_OTHER): Payer: Medicare Other | Admitting: Physical Therapy

## 2023-02-14 ENCOUNTER — Encounter (HOSPITAL_BASED_OUTPATIENT_CLINIC_OR_DEPARTMENT_OTHER): Payer: Self-pay | Admitting: Physical Therapy

## 2023-02-14 ENCOUNTER — Ambulatory Visit (HOSPITAL_BASED_OUTPATIENT_CLINIC_OR_DEPARTMENT_OTHER): Payer: Medicare Other | Attending: Family Medicine | Admitting: Physical Therapy

## 2023-02-14 DIAGNOSIS — R2681 Unsteadiness on feet: Secondary | ICD-10-CM | POA: Diagnosis not present

## 2023-02-14 DIAGNOSIS — M5459 Other low back pain: Secondary | ICD-10-CM | POA: Insufficient documentation

## 2023-02-14 DIAGNOSIS — M6281 Muscle weakness (generalized): Secondary | ICD-10-CM | POA: Diagnosis not present

## 2023-02-14 DIAGNOSIS — R278 Other lack of coordination: Secondary | ICD-10-CM | POA: Insufficient documentation

## 2023-02-14 DIAGNOSIS — R2689 Other abnormalities of gait and mobility: Secondary | ICD-10-CM | POA: Insufficient documentation

## 2023-02-14 DIAGNOSIS — R296 Repeated falls: Secondary | ICD-10-CM | POA: Insufficient documentation

## 2023-02-14 DIAGNOSIS — M25512 Pain in left shoulder: Secondary | ICD-10-CM | POA: Insufficient documentation

## 2023-02-14 DIAGNOSIS — G8929 Other chronic pain: Secondary | ICD-10-CM | POA: Insufficient documentation

## 2023-02-14 NOTE — Therapy (Signed)
OUTPATIENT PHYSICAL THERAPY LOWER EXTREMITY TREATMENT     Patient Name: Richard Vaughn MRN: [id number] DOB:09/06/1951, 72 y.o., male Today's Date: 02/14/2023  END OF SESSION:  PT End of Session - 02/14/23 1415     Visit Number 22    Number of Visits 30    Date for PT Re-Evaluation 04/24/23    Authorization Type MCR    PT Start Time 1408   late arrival   PT Stop Time 1440    PT Time Calculation (min) 32 min    Behavior During Therapy WFL for tasks assessed/performed                   Past Medical History:  Diagnosis Date   Barrett's esophagus    Chronic back pain    Depression    Gastritis    Mild   Gastroparesis    GERD (gastroesophageal reflux disease)    Headache    History of kidney stones    Hyperglycemia    Hypertension    Nephrolithiasis    Pneumonia    Covid pneumonia   Renal cyst    Sleep apnea    Stricture and stenosis of esophagus    Stroke 07/2015   Vision abnormalities    Past Surgical History:  Procedure Laterality Date   CERVICAL [place] SURGERY  2012   COLONOSCOPY     LUMBAR [place] SURGERY  2005, 2010   replaced L4 and L5   SHOULDER ARTHROSCOPY WITH ROTATOR CUFF REPAIR Left 08/14/2020   Procedure: SHOULDER ARTHROSCOPY WITH ROTATOR CUFF REPAIR, SUBACROMIAL DECOMPRESSION;  Surgeon: Tania Ade, MD;  Location: [facility];  Service: Orthopedics;  Laterality: Left;   UPPER GASTROINTESTINAL ENDOSCOPY     Patient Active Problem List   Diagnosis Date Noted   Drug-induced constipation 06/08/2022   Hemiparesis affecting left side as late effect of cerebrovascular accident 12/09/2021   Depression, major, recurrent, mild    Sleep disturbance    Urinary retention    Bradycardia    Essential hypertension    Temperature elevated    Left thyroid nodule    Treatment-emergent central sleep apnea 10/25/2018   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) 10/25/2018   Chronic pain syndrome    Chronic bilateral low back pain     Benign essential HTN    Tobacco abuse    Hyperlipidemia    History of CVA with residual deficit    Dysphagia, post-stroke    ICH (intracerebral hemorrhage) ([place]) - R thalmic/PLIC d/t HTN 123456   Insomnia 07/13/2017   PAD (peripheral artery disease) 07/13/2017   Hyperlipidemia with target LDL less than 70 07/13/2017   Stroke-like symptom 09/22/2016   Paresthesia 09/22/2016   CVA (cerebral vascular accident) 09/22/2016   Cerebrovascular accident (CVA) due to thrombosis of left carotid artery 09/23/2015   Primary snoring 09/23/2015   Hypersomnia with sleep apnea 09/23/2015   Obstructive sleep apnea 09/23/2015   Lacunar infarct, acute 08/25/2015   Sleep apnea 08/25/2015   Dysarthria    CVD (cardiovascular disease) 08/02/2015   Acute hyperglycemia 0000000   Systolic hypertension with cerebrovascular disease 12/27/2014   Epistaxis 07/28/2012   NEPHROLITHIASIS 05/23/2009   BARRETTS ESOPHAGUS 02/20/2009   Gastroparesis 02/20/2009   RADICULOPATHY 10/21/2008   GERD 10/08/2008   ESOPHAGITIS 08/15/2008   ESOPHAGEAL STRICTURE 08/15/2008   GASTRITIS, ACUTE 08/15/2008   RENAL CYST 08/14/2008   TOBACCO ABUSE 08/08/2008   HEMATURIA, MICROSCOPIC, HX OF 08/08/2008   PULMONARY NODULE 01/10/2008    PCP:  Dr Betty Martinique   REFERRING PROVIDER: Dr Betty Martinique   REFERRING DIAG:  Diagnosis  R29.6 (ICD-10-CM) - Frequent falls    THERAPY DIAG:  Other abnormalities of gait and mobility  Other lack of coordination  Chronic left shoulder pain  Rationale for Evaluation and Treatment: Rehabilitation  ONSET DATE: increasing number of falls over the past few weeks  SUBJECTIVE:   SUBJECTIVE STATEMENT:  Pt states that he did well after last session. No falls since last treatment. Pt states he has taken advice on rest breaks during walks and it has helped with freezing moments/stumbles.  Eval: The patient has a long history of balance disturbances following a stroke in 2016. He  reports over the past several months the balance deficits have increased. He has had 4 major falls. 3 of which were falling backwards. The other fall was from tripping over an object. He is using a cane right now 2nd to decreased ability to use his left arm.   PERTINENT HISTORY: Depression, chronic low back pain, Lumbar surgery 2005 , 2010 ; RTC repair 2021; CVA, HTN, PNA, Stroke 2016. Stroke effected Left UE,   PAIN:  Are you having pain? Yes: NPRS scale: 5/10 Pain location: middle of his lower back  Pain description: aching  Aggravating factors: bending  Relieving factors: rest   PRECAUTIONS: Fall  WEIGHT BEARING RESTRICTIONS: No  FALLS:  Has patient fallen in last 6 months? Yes. 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 a week  PLOF: Independent with use of a cane   PATIENT GOALS:  To improve balance   OBJECTIVE:   FOTO 42 pts 2/6  3/11 FOTO 45   BERG Balance Test                                              Date: 3/11   Sit to Stand 3  Standing unsupported 4  Sitting with back unsupported but feet supported 4  Stand to sit  3  Transfers  3  Standing unsupported with eyes closed 4  Standing unsupported feet together 4  From standing position, reach forward with outstretched arm 3  From standing position, pick up object from floor 4  From standing position, turn and look behind over each shoulder 4  Turn 360 3  Standing unsupported, alternately place foot on step 1  Standing unsupported, one foot in front 3  Standing on one leg 1  Total:  41/56     5xSTS: 45.2s  with UE assist  STS Transfers: improved fwd trunk lean during transition, reduction of posterior LOB, able to come to full standing independently  LOWER EXTREMITY MMT:   MMT Right eval R 3/11 Left eval L 3/11  Hip flexion 34.3 52.1 35.6 49.1  Hip extension        Hip abduction 47.0 53.5 41.7  53.6  Hip adduction        Hip internal rotation        Hip external rotation        Knee flexion        Knee extension 44.3 62.5 32.6 65.3  Ankle dorsiflexion        Ankle plantarflexion        Ankle inversion  Ankle eversion         (Blank rows = not tested)   TODAY'S TREATMENT:                                                                                                                               DATE:   4/1/224   Nu step Lvl 9 warm up 6 min UE and LE   6" box step up 3x5 4" lateral step up on R 5x; L sided heel tap 3x5 Half foam roll step up and over 5x with L LE  HEP verbally updated to add in L LE stair taps with R UE support    3/11/224   Nu step Lvl 9 warm up 6 min UE and LE  Exercises- gait belt on throughout session Outdoor walking on uneven surfaces/sloped surfaces, step/stair management/sequencing;  Uphill and down hill on grass 2x laps 5x curb management   3/11/224  Nu step Lvl 9 warm up 6 min UE and LE  Performance testing; edu of results, POC  Exercises - Gastroc Stretch on Wall  - 2 x daily - 7 x weekly - 1 sets - 3 reps - 30 hold - 2" box heel tap 2x10 2" box calf stretch 30s 2x  01/20/23: Pt seen for aquatic therapy today.  Treatment took place in water 3.2-4.75 ft in depth at the [facility] pool. Temp of water was 91.  Pt entered/exited the pool via stairs with bilat rail with supervision.  Therapist on deck entire session.   - without support forward/ backward gait, marching forward/backward - with orange weighted ball - step R/L and reach , both hands onball) x 5 each L/R; gentle trunk rotation with both hands on ball - side stepping L/R with arm abdct/addct - L stretch at wall - SLS with 2 fingers on wall x 10s each x 2 - holding wall:  wall push ups x 8;   - semi tandem stance with horiz head turns - high knee marching forward;  forward walking kick (like soccer ball) (one width each) -holding wall:  3 way toe  tap x 5 each - holding yellow noodle:  hip abdct/addct x 10 each, forward gait with increased speed - R/L hamstring stretch with foot on 2nd step x 10-15s x 2  2/26 Nu step Lvl 9 warm up 6 min UE and LE  Outdoor walking on uneven surfaces/sloped surfaces, step/stair management/sequencing; AD usage/assist positions from spouse with outdoor ambulation; preventing freezing/knees locking by dropping COG  Exercises - Gastroc Stretch on Wall  - 2 x daily - 7 x weekly - 1 sets - 3 reps - 30 hold - Heel Raise/Toe Raise  - 1 x daily - 7 x weekly - 2 sets - 10 reps - Standing March with Counter Support  - 1 x daily - 7 x weekly - 2 sets - 10 reps - Sit to Stand with Armchair  - 1 x  daily - 7 x weekly - 3 sets - 5 reps  01/13/23: Pt seen for aquatic therapy today.  Treatment took place in water 3.2-4.75 ft in depth at the [facility] pool. Temp of water was 91.  Pt entered/exited the pool via stairs with bilat rail with supervision.  Therapist on deck entire session.   - without support forward/ backward gait, marching forward/backward - with orange weighted ball - Lt step and pass it forward to wife x 8;  step and reach (crossing midline, both hands onball) x 5 each L/R; gentle trunk rotation with both hands on ball - side stepping L/R  - L stretch at wall - SLS with 2 fingers on wall x 10s each x 2 - holding wall:  heel raises x 10, hip ext to toe touch x 10 each - holding yellow noodle:  hip abdct/addct x 10 each, high knee marching forward; forward/backward gait with increased speed - SLS with 3 way toe tap x 5 each with yellow noodle - L stretch at stairs  2/26 Nu step Lvl 9 warm up 6 min UE and LE  Obstacle course with and without SPC (step over, around, and pliant surfaces) 6x  Wobble board 2x20 each ML and AP taps Retro and semi-tandem walk long silver bar 4x laps each Standing HR/TR with UE support 20x   PATIENT EDUCATION:  Education details:  balance/falls prevention,  envelope of function, HEP, POC  Person educated: Patient Education method: Explanation, Demonstration, Tactile cues, Verbal cues Education comprehension: verbalized understanding, returned demonstration, verbal cues required, tactile cues required, and needs further education  HOME EXERCISE PROGRAM:  Access Code: [email or web address] URL: [email or web address] Date: 01/17/2023 Prepared by: Daleen Bo  ASSESSMENT:  CLINICAL IMPRESSION:  Pt with much better L foot clearance today with exercise. Repeated VC given for exaggerated hip flexion and ankle DF to clear steps with step ups and obstacle navigation. Pt with much better L LE awareness with dynamic stepping activity. Pt appears to have more difficulty with retro step over and coordination compared to easily clearing fwd steps. Pt advised on new verbal HEP. Plan to continue with dynamic balance as tolerated. Pt would benefit from continued skilled therapy in order to reach goals and maximize functional LE strength and balance for prevention of future falls and further functional decline.    OBJECTIVE IMPAIRMENTS: Abnormal gait, decreased activity tolerance, decreased knowledge of use of DME, decreased mobility, difficulty walking, decreased ROM, decreased strength, decreased safety awareness, increased fascial restrictions, increased muscle spasms, impaired flexibility, postural dysfunction, and pain.   ACTIVITY LIMITATIONS: carrying, lifting, bending, standing, squatting, stairs, transfers, bed mobility, reach over head, and locomotion level  PARTICIPATION LIMITATIONS: meal prep, cleaning, laundry, driving, shopping, community activity, and yard work  PERSONAL FACTORS: 3+ comorbidities: Low back Surgery, frequent falls; Past CVA   are also affecting patient's functional outcome.   REHAB POTENTIAL: Good  CLINICAL DECISION MAKING: Evolving/moderate complexity increasing falls  EVALUATION COMPLEXITY: Moderate   GOALS: Goals  reviewed with patient? Yes  SHORT TERM GOALS: Target date: 12/10/2022   Patient will decrease 5x sit to stand time by 10 seconds and before all 5 reps.  Baseline: Goal status: ongoing; able to complete 5  2.  Patient will increase left quad and glut strength by 5 lbs  Baseline:  Goal status: MET  3.  Patient will transfer sit to stand with SBA and decreased use of his hands  Baseline:  Goal status: met   LONG TERM GOALS: Target date: 05/09/2023+  Patient will have no major falls over a month  Baseline:  Goal status: ongoing  2.  Patients will score > 42 on the BERG to show a decreased fall risk  Baseline:  Goal status: ongoing  3.  Patient will return to a full gym program  Baseline:  Goal status: MET  4.  Pt will be able to demonstrate ability to walk up and down ramps without freezing/fear of falling  in order to demonstrate functional improvement in balance/function for self-care and community ambulation   Baseline:  Goal status: Initial   PLAN:  PT FREQUENCY: 1x  PT DURATION: 12 wks (aim for D/C in 8)  PLANNED INTERVENTIONS: Therapeutic exercises, Therapeutic activity, Neuromuscular re-education, Balance training, Gait training, Patient/Family education, Self Care, Joint mobilization, Stair training, DME instructions, Aquatic Therapy, Spinal mobilization, Cryotherapy, Moist heat, Ultrasound, and Manual therapy  PLAN FOR NEXT SESSION:  outdoor balance as weather allows  Daleen Bo PT, DPT 02/14/23 4:29 PM

## 2023-02-15 ENCOUNTER — Encounter: Payer: Self-pay | Admitting: Speech Pathology

## 2023-02-15 ENCOUNTER — Ambulatory Visit: Payer: Medicare Other

## 2023-02-15 ENCOUNTER — Ambulatory Visit: Payer: Medicare Other | Attending: Physician Assistant | Admitting: Speech Pathology

## 2023-02-15 DIAGNOSIS — R1310 Dysphagia, unspecified: Secondary | ICD-10-CM | POA: Diagnosis not present

## 2023-02-15 DIAGNOSIS — R1312 Dysphagia, oropharyngeal phase: Secondary | ICD-10-CM | POA: Diagnosis not present

## 2023-02-15 DIAGNOSIS — Z8673 Personal history of transient ischemic attack (TIA), and cerebral infarction without residual deficits: Secondary | ICD-10-CM | POA: Insufficient documentation

## 2023-02-15 NOTE — Therapy (Signed)
OUTPATIENT SPEECH LANGUAGE PATHOLOGY SWALLOW EVALUATION   Patient Name: Richard Vaughn MRN: [id number] DOB:04-18-1951, 72 y.o., male Today's Date: 02/15/2023  PCP: [place], [name], MD REFERRING PROVIDER: Vladimir Crofts, PA-C   END OF SESSION:  End of Session - 02/15/23 1319     Visit Number 1    Number of Visits 5    Date for SLP Re-Evaluation 04/12/23    Authorization Type Medicare    SLP Start Time 1315    SLP Stop Time  1400    SLP Time Calculation (min) 45 min    Activity Tolerance Patient tolerated treatment well             Past Medical History:  Diagnosis Date   Barrett's esophagus    Chronic back pain    Depression    Gastritis    Mild   Gastroparesis    GERD (gastroesophageal reflux disease)    Headache    History of kidney stones    Hyperglycemia    Hypertension    Nephrolithiasis    Pneumonia    Covid pneumonia   Renal cyst    Sleep apnea    Stricture and stenosis of esophagus    Stroke 07/2015   Vision abnormalities    Past Surgical History:  Procedure Laterality Date   CERVICAL [place] SURGERY  2012   COLONOSCOPY     LUMBAR [place] SURGERY  2005, 2010   replaced L4 and L5   SHOULDER ARTHROSCOPY WITH ROTATOR CUFF REPAIR Left 08/14/2020   Procedure: SHOULDER ARTHROSCOPY WITH ROTATOR CUFF REPAIR, SUBACROMIAL DECOMPRESSION;  Surgeon: Tania Ade, MD;  Location: [facility];  Service: Orthopedics;  Laterality: Left;   UPPER GASTROINTESTINAL ENDOSCOPY     Patient Active Problem List   Diagnosis Date Noted   Drug-induced constipation 06/08/2022   Hemiparesis affecting left side as late effect of cerebrovascular accident 12/09/2021   Depression, major, recurrent, mild    Sleep disturbance    Urinary retention    Bradycardia    Essential hypertension    Temperature elevated    Left thyroid nodule    Treatment-emergent central sleep apnea 10/25/2018   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) 10/25/2018   Chronic pain  syndrome    Chronic bilateral low back pain    Benign essential HTN    Tobacco abuse    Hyperlipidemia    History of CVA with residual deficit    Dysphagia, post-stroke    ICH (intracerebral hemorrhage) ([facility]) - R thalmic/PLIC d/t HTN 123456   Insomnia 07/13/2017   PAD (peripheral artery disease) 07/13/2017   Hyperlipidemia with target LDL less than 70 07/13/2017   Stroke-like symptom 09/22/2016   Paresthesia 09/22/2016   CVA (cerebral vascular accident) 09/22/2016   Cerebrovascular accident (CVA) due to thrombosis of left carotid artery 09/23/2015   Primary snoring 09/23/2015   Hypersomnia with sleep apnea 09/23/2015   Obstructive sleep apnea 09/23/2015   Lacunar infarct, acute 08/25/2015   Sleep apnea 08/25/2015   Dysarthria    CVD (cardiovascular disease) 08/02/2015   Acute hyperglycemia 0000000   Systolic hypertension with cerebrovascular disease 12/27/2014   Epistaxis 07/28/2012   NEPHROLITHIASIS 05/23/2009   BARRETTS ESOPHAGUS 02/20/2009   Gastroparesis 02/20/2009   RADICULOPATHY 10/21/2008   GERD 10/08/2008   ESOPHAGITIS 08/15/2008   ESOPHAGEAL STRICTURE 08/15/2008   GASTRITIS, ACUTE 08/15/2008   RENAL CYST 08/14/2008   TOBACCO ABUSE 08/08/2008   HEMATURIA, MICROSCOPIC, HX OF 08/08/2008   PULMONARY NODULE 01/10/2008  ONSET DATE: April 2021   REFERRING DIAG:  R13.12 (ICD-10-CM) - Oropharyngeal dysphagia  Z86.73 (ICD-10-CM) - History of stroke    THERAPY DIAG:  Dysphagia, unspecified type  Rationale for Evaluation and Treatment: Rehabilitation  SUBJECTIVE:   SUBJECTIVE STATEMENT: "I'm good" Pt accompanied by: significant other  PERTINENT HISTORY: 72 year old male with history of short segment Barrett's esophagus noted on EGD in 2017 and stable on most recent EGD in March 2020. He has been on Nexium 40 mg once daily since have seen him (prescription actually says twice daily but he takes it once).  He reports good control of his reflux symptoms on  the regimen and is happy with it. Recall the patient has a history of what he says of 3 strokes in the past. 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  He he is on 81 mg/day aspirin. He has been dealing with dysphagia now for some time.  When I asked him where he localizes this it seems more in his throat or upper chest. His wife endorses that he coughs a lot when she eats meals with him, particularly at dinner.  He has been able to swallow his pills down okay but does have difficulty swallowing his foods.  PAIN:  Are you having pain? No  FALLS: Has patient fallen in last 6 months?  Yes, See PT evaluation for details  LIVING ENVIRONMENT: Lives with: lives with their family and lives with their spouse Lives in: House/apartment  PLOF:  Level of assistance: Needed assistance with ADLs Employment: Retired  PATIENT GOALS: "help me swallow"  OBJECTIVE:   DIAGNOSTIC FINDINGS: 12-01-22 MBSS: IMPRESSION: Laryngeal penetration and aspiration of thin barium. "Pharyngeal swallow is strong but inadequate laryngeal closure results in silent aspiration of that was not prevented with chin tuck posture or even tsp amounts. Pt did have a cough reflex x1 after several boluses of thin aspiration - suspect barium reached the carina. But pt denies this being consistent with his "choking" symptoms that prompted GI referral. No aspiration/deep penetration of nectar, pudding, cracker and tablet. "  COGNITION: Overall cognitive status: Within functional limits for tasks assessed  ORAL MOTOR EXAMINATION: Overall status: Impaired:   Labial: Right (Symmetry) Lingual: Right (ROM and Symmetry) Facial: Right (ROM and Symmetry) Mandible: ROM and Symmetry  Comments: Sensation intact, oral strength sufficient  CLINICAL SWALLOW ASSESSMENT:   Current diet: regular Dentition: adequate natural dentition Patient directly observed with POs: Yes: thin liquids  Feeding: able to feed self Liquids provided by: cup Oral  phase signs and symptoms:  n/a Pharyngeal phase signs and symptoms: immediate cough (when pt drank quickly)  EMST:  MEP: 44 Rate of perceived effort: 5-7/10  PATIENT REPORTED OUTCOME MEASURES (PROM): Deferred d/t time   TODAY'S TREATMENT: 02-15-23: Educated pt on strategies and exercises to manage oropharyngeal dysphagia. Initiated EMST and provided education on purpose and benefit. SLP provided model and then pt demonstrated back. SLP educated pt on effortful swallow and Mendelsohn maneuver and provided model. Pt performed exercises given model.  HEP: Complete dysphagia exercises  Plan to complete EAT-10 initial therapy session   PATIENT EDUCATION: Education details: See above Person educated: Patient and Spouse Education method: Explanation, Demonstration, and Handouts Education comprehension: verbalized understanding and needs further education   ASSESSMENT:  CLINICAL IMPRESSION: Patient is a 72 y.o. male who was seen today for dysphagia eval. Pt presents with oropharyngeal dysphagia, primarily characterized by silent aspiration (per instrumental swallow study), penetration and aspiration with thin liquids, and difficulty swallowing pills.  Pt reports trying to take smaller bites with solids and benefiting from swallowing pills with more solid consistencies. Initiated dysphagia exercises (EMST, effortful swallow, and Mendelsohn Maneuver). After demonstration, pt able to complete exercises. Began educating pt on dysphagia compensations. Pt would benefit from skilled ST to address aformentioned deficits to improve swallow safety.   OBJECTIVE IMPAIRMENTS: include dysphagia. These impairments are limiting patient from safety when swallowing. Factors affecting potential to achieve goals and functional outcome are previous level of function. Patient will benefit from skilled SLP services to address above impairments and improve overall function.  REHAB POTENTIAL: Good   GOALS: Goals  reviewed with patient? Yes  SHORT TERM GOALS: Target date: 03/15/2023    Pt will teach back dysphagia compensations given rare min-A.  Baseline: Goal status: INITIAL  2.  Pt will accurately demonstrate dysphagia exercises with rare min-A Baseline:  Goal status: INITIAL  3.  Pt will demonstrate swallow precautions with occasional min A  Baseline:  Goal status: INITIAL   LONG TERM GOALS: Target date: 04/12/2023  Pt will complete follow-up swallow instrumental study to objectively evaluate swallow, if indicated Baseline:  Goal status: INITIAL  2.  Pt will complete progressive dysphagia daily HEP over 2 week period with mod-I Baseline:  Goal status: INITIAL  3.  Pt will report carryover of swallowing strategies over 1 week period  Baseline:  Goal status: INITIAL  4.  Pt will report subjective improvement of swallow function via PROM by d/c Baseline:  Goal status: INITIAL  PLAN:  SLP FREQUENCY: 1x/week or 1x every other week - initiating at 1x every other week. May increase frequency based on pt response to interventions   SLP DURATION: 8 weeks  PLANNED INTERVENTIONS: SLP instruction and feedback, Compensatory strategies, Patient/family education, and dysphagia compensations and exercises     Leroy Libman, Student-SLP 02/15/2023, 2:23 PM

## 2023-02-15 NOTE — Patient Instructions (Signed)
EMST (Expiratory Muscle Strength Training): Complete 15 - 20 Make sure you are breathing in between Complete exercises after you eat, not before   Hard Swallow: Complete a set of 50 (split up into 5-10 at a time)   Should feel like you're squeezing all your throat muscles  Your tongue will be at the roof of your mouth  Mendelsohn Maneuver: When your adam's apple is all the way up during a swallow, stop in the middle of your swallow (when tongue is at the roof of your mouth) and hold for 3 seconds, then relax You can build up to 3 seconds with practice Complete 5, 2 times/ day   Tuesday Wed Thurs Fri Sat Sun Mon  EMST         Hard Swallow         Caryl Ada

## 2023-02-21 ENCOUNTER — Encounter (HOSPITAL_BASED_OUTPATIENT_CLINIC_OR_DEPARTMENT_OTHER): Payer: Self-pay | Admitting: Physical Therapy

## 2023-02-21 ENCOUNTER — Ambulatory Visit (HOSPITAL_BASED_OUTPATIENT_CLINIC_OR_DEPARTMENT_OTHER): Payer: Medicare Other | Admitting: Physical Therapy

## 2023-02-21 DIAGNOSIS — R296 Repeated falls: Secondary | ICD-10-CM

## 2023-02-21 DIAGNOSIS — G8929 Other chronic pain: Secondary | ICD-10-CM | POA: Diagnosis not present

## 2023-02-21 DIAGNOSIS — M6281 Muscle weakness (generalized): Secondary | ICD-10-CM

## 2023-02-21 DIAGNOSIS — M25512 Pain in left shoulder: Secondary | ICD-10-CM | POA: Diagnosis not present

## 2023-02-21 DIAGNOSIS — R2681 Unsteadiness on feet: Secondary | ICD-10-CM

## 2023-02-21 DIAGNOSIS — R278 Other lack of coordination: Secondary | ICD-10-CM | POA: Diagnosis not present

## 2023-02-21 DIAGNOSIS — R2689 Other abnormalities of gait and mobility: Secondary | ICD-10-CM | POA: Diagnosis not present

## 2023-02-21 DIAGNOSIS — M5459 Other low back pain: Secondary | ICD-10-CM

## 2023-02-21 NOTE — Therapy (Signed)
OUTPATIENT PHYSICAL THERAPY LOWER EXTREMITY TREATMENT     Patient Name: Richard Vaughn MRN: [id number] DOB:1951/03/12, 72 y.o., male Today's Date: 02/21/2023  END OF SESSION:  PT End of Session - 02/21/23 1456     Visit Number 23    Number of Visits 30    Date for PT Re-Evaluation 04/24/23    Authorization Type MCR    PT Start Time 1458   late   PT Stop Time 1525    PT Time Calculation (min) 27 min    Activity Tolerance Patient tolerated treatment well    Behavior During Therapy WFL for tasks assessed/performed                   Past Medical History:  Diagnosis Date   Barrett's esophagus    Chronic back pain    Depression    Gastritis    Mild   Gastroparesis    GERD (gastroesophageal reflux disease)    Headache    History of kidney stones    Hyperglycemia    Hypertension    Nephrolithiasis    Pneumonia    Covid pneumonia   Renal cyst    Sleep apnea    Stricture and stenosis of esophagus    Stroke 07/2015   Vision abnormalities    Past Surgical History:  Procedure Laterality Date   CERVICAL [place] SURGERY  2012   COLONOSCOPY     LUMBAR [place] SURGERY  2005, 2010   replaced L4 and L5   SHOULDER ARTHROSCOPY WITH ROTATOR CUFF REPAIR Left 08/14/2020   Procedure: SHOULDER ARTHROSCOPY WITH ROTATOR CUFF REPAIR, SUBACROMIAL DECOMPRESSION;  Surgeon: Jones Broom, MD;  Location: [facility];  Service: Orthopedics;  Laterality: Left;   UPPER GASTROINTESTINAL ENDOSCOPY     Patient Active Problem List   Diagnosis Date Noted   Drug-induced constipation 06/08/2022   Hemiparesis affecting left side as late effect of cerebrovascular accident 12/09/2021   Depression, major, recurrent, mild    Sleep disturbance    Urinary retention    Bradycardia    Essential hypertension    Temperature elevated    Left thyroid nodule    Treatment-emergent central sleep apnea 10/25/2018   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) 10/25/2018   Chronic pain  syndrome    Chronic bilateral low back pain    Benign essential HTN    Tobacco abuse    Hyperlipidemia    History of CVA with residual deficit    Dysphagia, post-stroke    ICH (intracerebral hemorrhage) ([facility]) - R thalmic/PLIC d/t HTN 71/24/5809   Insomnia 07/13/2017   PAD (peripheral artery disease) 07/13/2017   Hyperlipidemia with target LDL less than 70 07/13/2017   Stroke-like symptom 09/22/2016   Paresthesia 09/22/2016   CVA (cerebral vascular accident) 09/22/2016   Cerebrovascular accident (CVA) due to thrombosis of left carotid artery 09/23/2015   Primary snoring 09/23/2015   Hypersomnia with sleep apnea 09/23/2015   Obstructive sleep apnea 09/23/2015   Lacunar infarct, acute 08/25/2015   Sleep apnea 08/25/2015   Dysarthria    CVD (cardiovascular disease) 08/02/2015   Acute hyperglycemia 08/02/2015   Systolic hypertension with cerebrovascular disease 12/27/2014   Epistaxis 07/28/2012   NEPHROLITHIASIS 05/23/2009   BARRETTS ESOPHAGUS 02/20/2009   Gastroparesis 02/20/2009   RADICULOPATHY 10/21/2008   GERD 10/08/2008   ESOPHAGITIS 08/15/2008   ESOPHAGEAL STRICTURE 08/15/2008   GASTRITIS, ACUTE 08/15/2008   RENAL CYST 08/14/2008   TOBACCO ABUSE 08/08/2008   HEMATURIA, MICROSCOPIC, HX OF 08/08/2008  PULMONARY NODULE 01/10/2008    PCP: Dr Betty [place]   REFERRING PROVIDER: Dr Betty [place]   REFERRING DIAG:  Diagnosis  R29.6 (ICD-10-CM) - Frequent falls    THERAPY DIAG:  Repeated falls  Other low back pain  Muscle weakness (generalized)  Unsteadiness on feet  Rationale for Evaluation and Treatment: Rehabilitation  ONSET DATE: increasing number of falls over the past few weeks  SUBJECTIVE:   SUBJECTIVE STATEMENT:  Pt states that he did well after last session. No falls since last treatment. Pt states his head feels cloudy today. It has lasted over the last day or so. No near stumbles but has not walked much over the weekend.  Eval: The patient has a  long history of balance disturbances following a stroke in 2016. He reports over the past several months the balance deficits have increased. He has had 4 major falls. 3 of which were falling backwards. The other fall was from tripping over an object. He is using a cane right now 2nd to decreased ability to use his left arm.   PERTINENT HISTORY: Depression, chronic low back pain, Lumbar surgery 2005 , 2010 ; RTC repair 2021; CVA, HTN, PNA, Stroke 2016. Stroke effected Left UE,   PAIN:  Are you having pain? Yes: NPRS scale: 5/10 Pain location: middle of his lower back  Pain description: aching  Aggravating factors: bending  Relieving factors: rest   PRECAUTIONS: Fall  WEIGHT BEARING RESTRICTIONS: No  FALLS:  Has patient fallen in last 6 months? Yes. Number of falls 4  LIVING ENVIRONMENT: 2 steps into the house  OCCUPATION: retired   Presenter, broadcastingHobbies: was going to the gym 3x a week and walking 2x a week  PLOF: Independent with use of a cane   PATIENT GOALS:  To improve balance   OBJECTIVE:   FOTO 42 pts 2/6  3/11 FOTO 45   BERG Balance Test                                              Date: 3/11   Sit to Stand 3  Standing unsupported 4  Sitting with back unsupported but feet supported 4  Stand to sit  3  Transfers  3  Standing unsupported with eyes closed 4  Standing unsupported feet together 4  From standing position, reach forward with outstretched arm 3  From standing position, pick up object from floor 4  From standing position, turn and look behind over each shoulder 4  Turn 360 3  Standing unsupported, alternately place foot on step 1  Standing unsupported, one foot in front 3  Standing on one leg 1  Total:  41/56     5xSTS: 45.2s  with UE assist  STS Transfers: improved fwd trunk lean during transition, reduction of posterior LOB, able to come to full standing independently  LOWER EXTREMITY MMT:   MMT Right eval R 3/11 Left eval L 3/11  Hip flexion 34.3  52.1 35.6 49.1  Hip extension        Hip abduction 47.0 53.5 41.7 53.6  Hip adduction        Hip internal rotation        Hip external rotation        Knee flexion        Knee extension 44.3 62.5 32.6 65.3  Ankle dorsiflexion  Ankle plantarflexion        Ankle inversion        Ankle eversion         (Blank rows = not tested)   TODAY'S TREATMENT:                                                                                                                               DATE:   4/8/224  Nu step Lvl 9 warm up 6 min UE and LE   Standing march 2x20  139/104 BP seated with R arm  HR 115  Session stopped due to uncontrolled emotional outbursts  Face symmetrical, no complaints paresthesia, speech slurred at baseline, no acute balance deficits  4/1/224   Nu step Lvl 9 warm up 6 min UE and LE   6" box step up 3x5 4" lateral step up on R 5x; L sided heel tap 3x5 Half foam roll step up and over 5x with L LE  HEP verbally updated to add in L LE stair taps with R UE support    3/11/224   Nu step Lvl 9 warm up 6 min UE and LE  Exercises- gait belt on throughout session Outdoor walking on uneven surfaces/sloped surfaces, step/stair management/sequencing;  Uphill and down hill on grass 2x laps 5x curb management   3/11/224  Nu step Lvl 9 warm up 6 min UE and LE  Performance testing; edu of results, POC  Exercises - Gastroc Stretch on Wall  - 2 x daily - 7 x weekly - 1 sets - 3 reps - 30 hold - 2" box heel tap 2x10 2" box calf stretch 30s 2x  01/20/23: Pt seen for aquatic therapy today.  Treatment took place in water 3.2-4.75 ft in depth at the [facility] pool. Temp of water was 91.  Pt entered/exited the pool via stairs with bilat rail with supervision.  Therapist on deck entire session.   - without support forward/ backward gait, marching forward/backward - with orange weighted ball - step R/L and reach , both hands onball) x 5 each L/R; gentle  trunk rotation with both hands on ball - side stepping L/R with arm abdct/addct - L stretch at wall - SLS with 2 fingers on wall x 10s each x 2 - holding wall:  wall push ups x 8;   - semi tandem stance with horiz head turns - high knee marching forward;  forward walking kick (like soccer ball) (one width each) -holding wall:  3 way toe tap x 5 each - holding yellow noodle:  hip abdct/addct x 10 each, forward gait with increased speed - R/L hamstring stretch with foot on 2nd step x 10-15s x 2  2/26 Nu step Lvl 9 warm up 6 min UE and LE  Outdoor walking on uneven surfaces/sloped surfaces, step/stair management/sequencing; AD usage/assist positions from spouse with outdoor ambulation; preventing freezing/knees locking by dropping COG  Exercises - Theme park manager on  Wall  - 2 x daily - 7 x weekly - 1 sets - 3 reps - 30 hold - Heel Raise/Toe Raise  - 1 x daily - 7 x weekly - 2 sets - 10 reps - Standing March with Counter Support  - 1 x daily - 7 x weekly - 2 sets - 10 reps - Sit to Stand with Armchair  - 1 x daily - 7 x weekly - 3 sets - 5 reps  01/13/23: Pt seen for aquatic therapy today.  Treatment took place in water 3.2-4.75 ft in depth at the [facility] pool. Temp of water was 91.  Pt entered/exited the pool via stairs with bilat rail with supervision.  Therapist on deck entire session.   - without support forward/ backward gait, marching forward/backward - with orange weighted ball - Lt step and pass it forward to wife x 8;  step and reach (crossing midline, both hands onball) x 5 each L/R; gentle trunk rotation with both hands on ball - side stepping L/R  - L stretch at wall - SLS with 2 fingers on wall x 10s each x 2 - holding wall:  heel raises x 10, hip ext to toe touch x 10 each - holding yellow noodle:  hip abdct/addct x 10 each, high knee marching forward; forward/backward gait with increased speed - SLS with 3 way toe tap x 5 each with yellow noodle - L stretch at  stairs  2/26 Nu step Lvl 9 warm up 6 min UE and LE  Obstacle course with and without SPC (step over, around, and pliant surfaces) 6x  Wobble board 2x20 each ML and AP taps Retro and semi-tandem walk long silver bar 4x laps each Standing HR/TR with UE support 20x   PATIENT EDUCATION:  Education details:  balance/falls prevention, envelope of function, HEP, POC  Person educated: Patient Education method: Explanation, Demonstration, Tactile cues, Verbal cues Education comprehension: verbalized understanding, returned demonstration, verbal cues required, tactile cues required, and needs further education  HOME EXERCISE PROGRAM:  Access Code: [email or web address] URL: [email or web address] Date: 01/17/2023 Prepared by: Zebedee Iba  ASSESSMENT:  CLINICAL IMPRESSION:  Pt session stopped early today due to unsafe vitals as well as controlled emotional bouts during session. Pt reports no triggering emotional thoughts or incidents- just crying from unknown causes. Pt vitals likely triggered by emotional state but no acute signs of CVA. Pt also with 2 moments of forgetting exercise mid-task. Pt advised to seek MD if BP does not improve or if symptoms worsen. PT walk pt out to wife who was made aware of situation. Pt would benefit from continued skilled therapy in order to reach goals and maximize functional LE strength and balance for prevention of future falls and further functional decline.    OBJECTIVE IMPAIRMENTS: Abnormal gait, decreased activity tolerance, decreased knowledge of use of DME, decreased mobility, difficulty walking, decreased ROM, decreased strength, decreased safety awareness, increased fascial restrictions, increased muscle spasms, impaired flexibility, postural dysfunction, and pain.   ACTIVITY LIMITATIONS: carrying, lifting, bending, standing, squatting, stairs, transfers, bed mobility, reach over head, and locomotion level  PARTICIPATION LIMITATIONS: meal prep,  cleaning, laundry, driving, shopping, community activity, and yard work  PERSONAL FACTORS: 3+ comorbidities: Low back Surgery, frequent falls; Past CVA   are also affecting patient's functional outcome.   REHAB POTENTIAL: Good  CLINICAL DECISION MAKING: Evolving/moderate complexity increasing falls  EVALUATION COMPLEXITY: Moderate   GOALS: Goals reviewed with patient? Yes  SHORT TERM GOALS: Target date: 12/10/2022   Patient  will decrease 5x sit to stand time by 10 seconds and before all 5 reps.  Baseline: Goal status: ongoing; able to complete 5  2.  Patient will increase left quad and glut strength by 5 lbs  Baseline:  Goal status: MET  3.  Patient will transfer sit to stand with SBA and decreased use of his hands  Baseline:  Goal status: met   LONG TERM GOALS: Target date: 05/16/2023+    Patient will have no major falls over a month  Baseline:  Goal status: ongoing  2.  Patients will score > 42 on the BERG to show a decreased fall risk  Baseline:  Goal status: ongoing  3.  Patient will return to a full gym program  Baseline:  Goal status: MET  4.  Pt will be able to demonstrate ability to walk up and down ramps without freezing/fear of falling  in order to demonstrate functional improvement in balance/function for self-care and community ambulation   Baseline:  Goal status: Initial   PLAN:  PT FREQUENCY: 1x  PT DURATION: 12 wks (aim for D/C in 8)  PLANNED INTERVENTIONS: Therapeutic exercises, Therapeutic activity, Neuromuscular re-education, Balance training, Gait training, Patient/Family education, Self Care, Joint mobilization, Stair training, DME instructions, Aquatic Therapy, Spinal mobilization, Cryotherapy, Moist heat, Ultrasound, and Manual therapy  PLAN FOR NEXT SESSION:  outdoor balance as weather allows  Zebedee Iba PT, DPT 02/21/23 3:30 PM

## 2023-02-23 ENCOUNTER — Encounter (HOSPITAL_BASED_OUTPATIENT_CLINIC_OR_DEPARTMENT_OTHER): Payer: Self-pay | Admitting: Physical Therapy

## 2023-03-02 ENCOUNTER — Other Ambulatory Visit: Payer: Self-pay | Admitting: Family Medicine

## 2023-03-02 DIAGNOSIS — I10 Essential (primary) hypertension: Secondary | ICD-10-CM

## 2023-03-02 MED ORDER — OLMESARTAN MEDOXOMIL 20 MG PO TABS: 20.0000 mg | ORAL_TABLET | Freq: Every day | ORAL | 3 refills | Status: DC

## 2023-03-02 NOTE — Addendum Note (Signed)
Addended by: Weyman Croon E on: 03/02/2023 12:42 PM   Modules accepted: Orders

## 2023-03-04 ENCOUNTER — Ambulatory Visit: Payer: Medicare Other | Admitting: Speech Pathology

## 2023-03-04 DIAGNOSIS — R1312 Dysphagia, oropharyngeal phase: Secondary | ICD-10-CM | POA: Diagnosis not present

## 2023-03-04 DIAGNOSIS — R1310 Dysphagia, unspecified: Secondary | ICD-10-CM | POA: Diagnosis not present

## 2023-03-04 DIAGNOSIS — Z8673 Personal history of transient ischemic attack (TIA), and cerebral infarction without residual deficits: Secondary | ICD-10-CM | POA: Diagnosis not present

## 2023-03-04 NOTE — Therapy (Signed)
OUTPATIENT SPEECH LANGUAGE PATHOLOGY SWALLOW EVALUATION   Patient Name: Richard Vaughn MRN: [id number] DOB:04-18-1951, 72 y.o., male Today's Date: 02/15/2023  PCP: [place], [name], MD REFERRING PROVIDER: Vladimir Crofts, PA-C   END OF SESSION:  End of Session - 02/15/23 1319     Visit Number 1    Number of Visits 5    Date for SLP Re-Evaluation 04/12/23    Authorization Type Medicare    SLP Start Time 1315    SLP Stop Time  1400    SLP Time Calculation (min) 45 min    Activity Tolerance Patient tolerated treatment well             Past Medical History:  Diagnosis Date   Barrett's esophagus    Chronic back pain    Depression    Gastritis    Mild   Gastroparesis    GERD (gastroesophageal reflux disease)    Headache    History of kidney stones    Hyperglycemia    Hypertension    Nephrolithiasis    Pneumonia    Covid pneumonia   Renal cyst    Sleep apnea    Stricture and stenosis of esophagus    Stroke 07/2015   Vision abnormalities    Past Surgical History:  Procedure Laterality Date   CERVICAL [place] SURGERY  2012   COLONOSCOPY     LUMBAR [place] SURGERY  2005, 2010   replaced L4 and L5   SHOULDER ARTHROSCOPY WITH ROTATOR CUFF REPAIR Left 08/14/2020   Procedure: SHOULDER ARTHROSCOPY WITH ROTATOR CUFF REPAIR, SUBACROMIAL DECOMPRESSION;  Surgeon: Tania Ade, MD;  Location: [facility];  Service: Orthopedics;  Laterality: Left;   UPPER GASTROINTESTINAL ENDOSCOPY     Patient Active Problem List   Diagnosis Date Noted   Drug-induced constipation 06/08/2022   Hemiparesis affecting left side as late effect of cerebrovascular accident 12/09/2021   Depression, major, recurrent, mild    Sleep disturbance    Urinary retention    Bradycardia    Essential hypertension    Temperature elevated    Left thyroid nodule    Treatment-emergent central sleep apnea 10/25/2018   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) 10/25/2018   Chronic pain  syndrome    Chronic bilateral low back pain    Benign essential HTN    Tobacco abuse    Hyperlipidemia    History of CVA with residual deficit    Dysphagia, post-stroke    ICH (intracerebral hemorrhage) ([place]) - R thalmic/PLIC d/t HTN 123456   Insomnia 07/13/2017   PAD (peripheral artery disease) 07/13/2017   Hyperlipidemia with target LDL less than 70 07/13/2017   Stroke-like symptom 09/22/2016   Paresthesia 09/22/2016   CVA (cerebral vascular accident) 09/22/2016   Cerebrovascular accident (CVA) due to thrombosis of left carotid artery 09/23/2015   Primary snoring 09/23/2015   Hypersomnia with sleep apnea 09/23/2015   Obstructive sleep apnea 09/23/2015   Lacunar infarct, acute 08/25/2015   Sleep apnea 08/25/2015   Dysarthria    CVD (cardiovascular disease) 08/02/2015   Acute hyperglycemia 0000000   Systolic hypertension with cerebrovascular disease 12/27/2014   Epistaxis 07/28/2012   NEPHROLITHIASIS 05/23/2009   BARRETTS ESOPHAGUS 02/20/2009   Gastroparesis 02/20/2009   RADICULOPATHY 10/21/2008   GERD 10/08/2008   ESOPHAGITIS 08/15/2008   ESOPHAGEAL STRICTURE 08/15/2008   GASTRITIS, ACUTE 08/15/2008   RENAL CYST 08/14/2008   TOBACCO ABUSE 08/08/2008   HEMATURIA, MICROSCOPIC, HX OF 08/08/2008   PULMONARY NODULE 01/10/2008  ONSET DATE: April 2021   REFERRING DIAG:  R13.12 (ICD-10-CM) - Oropharyngeal dysphagia  Z86.73 (ICD-10-CM) - History of stroke    THERAPY DIAG:  Dysphagia, unspecified type  Rationale for Evaluation and Treatment: Rehabilitation  SUBJECTIVE:   SUBJECTIVE STATEMENT: "I'm good!"  Pt accompanied by: significant other  PERTINENT HISTORY: 72 year old male with history of short segment Barrett's esophagus noted on EGD in 2017 and stable on most recent EGD in March 2020. He has been on Nexium 40 mg once daily since have seen him (prescription actually says twice daily but he takes it once).  He reports good control of his reflux symptoms  on the regimen and is happy with it. Recall the patient has a history of what he says of 3 strokes in the past. 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  He he is on 81 mg/day aspirin. He has been dealing with dysphagia now for some time.  When I asked him where he localizes this it seems more in his throat or upper chest. His wife endorses that he coughs a lot when she eats meals with him, particularly at dinner.  He has been able to swallow his pills down okay but does have difficulty swallowing his foods.  PAIN:  Are you having pain? No  FALLS: Has patient fallen in last 6 months?  Yes, See PT evaluation for details  LIVING ENVIRONMENT: Lives with: lives with their family and lives with their spouse Lives in: House/apartment  PLOF:  Level of assistance: Needed assistance with ADLs Employment: Retired  PATIENT GOALS: "help me swallow"  OBJECTIVE:   TODAY'S TREATMENT:  03-04-23: Pt did not bring EMST device with him this date. He did rate his recent perceived effort completing his EMST exercises at home as a 5 on a scale from 1 to 10. Plan to adjust his device next session and reminded him to bring it in next time.  Pt complete EAT-10 PROM with result of 12/40. (0 = no problem, 4 = severe problem). Pt rated 3 for "the pleasure of eating is affected by my swallowing" and 2 for "swallowing solids and pills takes extra effort".  Pt reported that he relies on his wife to remember to do his swallow exercises, although he has been trying to complete them twice a day. He has been doing EMST and two sets of 30 hard swallows , but not the [place]. He had no completed any exercises this date. SLP led pt through completing 20 hard swallows with mod-I and then 5 Mendelsohns. Re-educated pt on importance of completing his exercises to strengthen his swallow. Pt endorsed that he has been getting less choked up when drinking recently.   02-15-23: Educated pt on strategies and exercises to manage  oropharyngeal dysphagia. Initiated EMST and provided education on purpose and benefit. SLP provided model and then pt demonstrated back. SLP educated pt on effortful swallow and Mendelsohn maneuver and provided model. Pt performed exercises given model.  HEP: Complete dysphagia exercises  Plan to complete EAT-10 initial therapy session   PATIENT EDUCATION: Education details: See above Person educated: Patient and Spouse Education method: Explanation, Demonstration, and Handouts Education comprehension: verbalized understanding and needs further education   ASSESSMENT:  CLINICAL IMPRESSION: Pt reported that he has been mostly compliant with completing his exercises, although is not doing the [place] and relies on his wife to remember to do them. He has been completing 30 effortful swallows twice per day. He did not bring his EMST device this  date. SLP led pt through completing 20 effortful swallows and 5 Mendelsohns with mod-I. Pt endorsed that recently he has been getting less choked up when eating and drinking. Pt continues to benefit from skilled ST to address oropharyngeal dysphagia and improve swallow safety.   OBJECTIVE IMPAIRMENTS: include dysphagia. These impairments are limiting patient from safety when swallowing. Factors affecting potential to achieve goals and functional outcome are previous level of function. Patient will benefit from skilled SLP services to address above impairments and improve overall function.  REHAB POTENTIAL: Good   GOALS: Goals reviewed with patient? Yes  SHORT TERM GOALS: Target date: 03/15/2023    Pt will teach back dysphagia compensations given rare min-A.  Baseline: Goal status: IN PROGRESS  2.  Pt will accurately demonstrate dysphagia exercises with rare min-A Baseline:  Goal status: IN PROGRESS  3.  Pt will demonstrate swallow precautions with occasional min A  Baseline:  Goal status: IN PROGRESS   LONG TERM GOALS: Target date:  04/12/2023  Pt will complete follow-up swallow instrumental study to objectively evaluate swallow, if indicated Baseline:  Goal status: IN PROGRESS  2.  Pt will complete progressive dysphagia daily HEP over 2 week period with mod-I Baseline:  Goal status: IN PROGRESS  3.  Pt will report carryover of swallowing strategies over 1 week period  Baseline:  Goal status: IN PROGRESS  4.  Pt will report subjective improvement of swallow function via PROM by d/c Baseline:  Goal status: IN PROGRESS  PLAN:  SLP FREQUENCY: 1x/week or 1x every other week - initiating at 1x every other week. May increase frequency based on pt response to interventions   SLP DURATION: 8 weeks  PLANNED INTERVENTIONS: SLP instruction and feedback, Compensatory strategies, Patient/family education, and dysphagia compensations and exercises     Cephus Shelling, Student-SLP 02/15/2023, 2:23 PM

## 2023-03-06 ENCOUNTER — Other Ambulatory Visit: Payer: Self-pay | Admitting: Family Medicine

## 2023-03-06 DIAGNOSIS — G4700 Insomnia, unspecified: Secondary | ICD-10-CM

## 2023-03-07 ENCOUNTER — Ambulatory Visit (HOSPITAL_BASED_OUTPATIENT_CLINIC_OR_DEPARTMENT_OTHER): Payer: Medicare Other | Admitting: Physical Therapy

## 2023-03-07 ENCOUNTER — Encounter (HOSPITAL_BASED_OUTPATIENT_CLINIC_OR_DEPARTMENT_OTHER): Payer: Self-pay | Admitting: Physical Therapy

## 2023-03-07 DIAGNOSIS — R2681 Unsteadiness on feet: Secondary | ICD-10-CM

## 2023-03-07 DIAGNOSIS — R278 Other lack of coordination: Secondary | ICD-10-CM | POA: Diagnosis not present

## 2023-03-07 DIAGNOSIS — R2689 Other abnormalities of gait and mobility: Secondary | ICD-10-CM | POA: Diagnosis not present

## 2023-03-07 DIAGNOSIS — G8929 Other chronic pain: Secondary | ICD-10-CM | POA: Diagnosis not present

## 2023-03-07 DIAGNOSIS — M5459 Other low back pain: Secondary | ICD-10-CM | POA: Diagnosis not present

## 2023-03-07 DIAGNOSIS — M6281 Muscle weakness (generalized): Secondary | ICD-10-CM

## 2023-03-07 DIAGNOSIS — M25512 Pain in left shoulder: Secondary | ICD-10-CM | POA: Diagnosis not present

## 2023-03-07 DIAGNOSIS — R296 Repeated falls: Secondary | ICD-10-CM

## 2023-03-07 NOTE — Therapy (Signed)
OUTPATIENT PHYSICAL THERAPY LOWER EXTREMITY TREATMENT     Patient Name: Richard Vaughn MRN: [id number] DOB:[birth year], 72 y.o., male Today's Date: 03/07/2023  END OF SESSION:  PT End of Session - 03/07/23 1415     Visit Number 24    Number of Visits 30    Date for PT Re-Evaluation 04/24/23    Authorization Type MCR    PT Start Time 1413   arrives late   PT Stop Time 1443    PT Time Calculation (min) 30 min    Activity Tolerance Patient tolerated treatment well    Behavior During Therapy [facility] for tasks assessed/performed                   Past Medical History:  Diagnosis Date   Barrett's esophagus    Chronic back pain    Depression    Gastritis    Mild   Gastroparesis    GERD (gastroesophageal reflux disease)    Headache    History of kidney stones    Hyperglycemia    Hypertension    Nephrolithiasis    Pneumonia    Covid pneumonia   Renal cyst    Sleep apnea    Stricture and stenosis of esophagus    Stroke 07/2015   Vision abnormalities    Past Surgical History:  Procedure Laterality Date   CERVICAL [place] SURGERY  2012   COLONOSCOPY     LUMBAR [place] SURGERY  2005, 2010   replaced L4 and L5   SHOULDER ARTHROSCOPY WITH ROTATOR CUFF REPAIR Left 08/14/2020   Procedure: SHOULDER ARTHROSCOPY WITH ROTATOR CUFF REPAIR, SUBACROMIAL DECOMPRESSION;  Surgeon: Jones Broom, MD;  Location: [facility];  Service: Orthopedics;  Laterality: Left;   UPPER GASTROINTESTINAL ENDOSCOPY     Patient Active Problem List   Diagnosis Date Noted   Drug-induced constipation 06/08/2022   Hemiparesis affecting left side as late effect of cerebrovascular accident 12/09/2021   Depression, major, recurrent, mild    Sleep disturbance    Urinary retention    Bradycardia    Essential hypertension    Temperature elevated    Left thyroid nodule    Treatment-emergent central sleep apnea 10/25/2018   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) 10/25/2018   Chronic  pain syndrome    Chronic bilateral low back pain    Benign essential HTN    Tobacco abuse    Hyperlipidemia    History of CVA with residual deficit    Dysphagia, post-stroke    ICH (intracerebral hemorrhage) ([facility]) - R thalmic/PLIC d/t HTN 40/98/1191   Insomnia 07/13/2017   PAD (peripheral artery disease) 07/13/2017   Hyperlipidemia with target LDL less than 70 07/13/2017   Stroke-like symptom 09/22/2016   Paresthesia 09/22/2016   CVA (cerebral vascular accident) 09/22/2016   Cerebrovascular accident (CVA) due to thrombosis of left carotid artery 09/23/2015   Primary snoring 09/23/2015   Hypersomnia with sleep apnea 09/23/2015   Obstructive sleep apnea 09/23/2015   Lacunar infarct, acute 08/25/2015   Sleep apnea 08/25/2015   Dysarthria    CVD (cardiovascular disease) 08/02/2015   Acute hyperglycemia 08/02/2015   Systolic hypertension with cerebrovascular disease 12/27/2014   Epistaxis 07/28/2012   NEPHROLITHIASIS 05/23/2009   BARRETTS ESOPHAGUS 02/20/2009   Gastroparesis 02/20/2009   RADICULOPATHY 10/21/2008   GERD 10/08/2008   ESOPHAGITIS 08/15/2008   ESOPHAGEAL STRICTURE 08/15/2008   GASTRITIS, ACUTE 08/15/2008   RENAL CYST 08/14/2008   TOBACCO ABUSE 08/08/2008   HEMATURIA, MICROSCOPIC, HX OF 08/08/2008  PULMONARY NODULE 01/10/2008    PCP: Dr Betty [place]   REFERRING PROVIDER: Dr Betty [place]   REFERRING DIAG:  Diagnosis  R29.6 (ICD-10-CM) - Frequent falls    THERAPY DIAG:  Repeated falls  Other low back pain  Muscle weakness (generalized)  Unsteadiness on feet  Rationale for Evaluation and Treatment: Rehabilitation  ONSET DATE: increasing number of falls over the past few weeks  SUBJECTIVE:   SUBJECTIVE STATEMENT:  Pt states that he did well after last session. No falls since last treatment. Pt states his head feels cloudy today. It has lasted over the last day or so. No near stumbles but has not walked much over the weekend.  Eval: The patient  has a long history of balance disturbances following a stroke in 2016. He reports over the past several months the balance deficits have increased. He has had 4 major falls. 3 of which were falling backwards. The other fall was from tripping over an object. He is using a cane right now 2nd to decreased ability to use his left arm.   PERTINENT HISTORY: Depression, chronic low back pain, Lumbar surgery 2005 , 2010 ; RTC repair 2021; CVA, HTN, PNA, Stroke 2016. Stroke effected Left UE,   PAIN:  Are you having pain? Yes: NPRS scale: 5/10 Pain location: middle of his lower back  Pain description: aching  Aggravating factors: bending  Relieving factors: rest   PRECAUTIONS: Fall  WEIGHT BEARING RESTRICTIONS: No  FALLS:  Has patient fallen in last 6 months? Yes. 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 and walking 2x a week  PLOF: Independent with use of a cane   PATIENT GOALS:  To improve balance   OBJECTIVE:   FOTO 42 pts 2/6  3/11 FOTO 45   BERG Balance Test                                              Date: 3/11   Sit to Stand 3  Standing unsupported 4  Sitting with back unsupported but feet supported 4  Stand to sit  3  Transfers  3  Standing unsupported with eyes closed 4  Standing unsupported feet together 4  From standing position, reach forward with outstretched arm 3  From standing position, pick up object from floor 4  From standing position, turn and look behind over each shoulder 4  Turn 360 3  Standing unsupported, alternately place foot on step 1  Standing unsupported, one foot in front 3  Standing on one leg 1  Total:  41/56     5xSTS: 45.2s  with UE assist  STS Transfers: improved fwd trunk lean during transition, reduction of posterior LOB, able to come to full standing independently  LOWER EXTREMITY MMT:   MMT Right eval R 3/11 Left eval L 3/11  Hip flexion  34.3 52.1 35.6 49.1  Hip extension        Hip abduction 47.0 53.5 41.7 53.6  Hip adduction        Hip internal rotation        Hip external rotation        Knee flexion        Knee extension 44.3 62.5 32.6 65.3  Ankle dorsiflexion  Ankle plantarflexion        Ankle inversion        Ankle eversion         (Blank rows = not tested)   TODAY'S TREATMENT:                                                                                                                               DATE:    03/07/23  Nu step Lvl 9 warm up 6 min UE and LE   6" box step down 3x6 6" box heel tap with UE support 4x8 Half foam roll step up and over 2x6 with L LE, 6x with R LE Standing stair alternating toe taps 2x20  4/8/224  Nu step Lvl 9 warm up 6 min UE and LE   Standing march 2x20  139/104 BP seated with R arm  HR 115  Session stopped due to uncontrolled emotional outbursts  Face symmetrical, no complaints paresthesia, speech slurred at baseline, no acute balance deficits  4/1/224   Nu step Lvl 9 warm up 6 min UE and LE   6" box step up 3x5 4" lateral step up on R 5x; L sided heel tap 3x5 Half foam roll step up and over 5x with L LE  HEP verbally updated to add in L LE stair taps with R UE support  PATIENT EDUCATION:  Education details:  balance/falls prevention, envelope of function, HEP, POC  Person educated: Patient Education method: Explanation, Demonstration, Tactile cues, Verbal cues Education comprehension: verbalized understanding, returned demonstration, verbal cues required, tactile cues required, and needs further education  HOME EXERCISE PROGRAM:  Access Code: [email or web address] URL: [email or web address] Date: 01/17/2023 Prepared by: Zebedee Iba  ASSESSMENT:  CLINICAL IMPRESSION:  Pt able to continue with previous exercise and progress to more eccentric strength and stability of the L LE today as compared to previous sessions. With VC to exaggerated  marching, pt was able to to perform alternating SLS to step without LOB. Pt does continue to have motor control deficits of the L that effect fwd and retro step length but has greatly improved catching of the L toe during obstacle navigation. Continue with L LE eccentrics, obstacle navigation, and outdoor surface training as weather allows. Pt would benefit from continued skilled therapy in order to reach goals and maximize functional LE strength and balance for prevention of future falls and further functional decline.    OBJECTIVE IMPAIRMENTS: Abnormal gait, decreased activity tolerance, decreased knowledge of use of DME, decreased mobility, difficulty walking, decreased ROM, decreased strength, decreased safety awareness, increased fascial restrictions, increased muscle spasms, impaired flexibility, postural dysfunction, and pain.   ACTIVITY LIMITATIONS: carrying, lifting, bending, standing, squatting, stairs, transfers, bed mobility, reach over head, and locomotion level  PARTICIPATION LIMITATIONS: meal prep, cleaning, laundry, driving, shopping, community activity, and yard work  PERSONAL FACTORS: 3+ comorbidities: Low back Surgery, frequent falls; Past CVA   are also affecting patient's functional outcome.  REHAB POTENTIAL: Good  CLINICAL DECISION MAKING: Evolving/moderate complexity increasing falls  EVALUATION COMPLEXITY: Moderate   GOALS: Goals reviewed with patient? Yes  SHORT TERM GOALS: Target date: 12/10/2022   Patient will decrease 5x sit to stand time by 10 seconds and before all 5 reps.  Baseline: Goal status: ongoing; able to complete 5  2.  Patient will increase left quad and glut strength by 5 lbs  Baseline:  Goal status: MET  3.  Patient will transfer sit to stand with SBA and decreased use of his hands  Baseline:  Goal status: met   LONG TERM GOALS: Target date: 05/30/2023+    Patient will have no major falls over a month  Baseline:  Goal status:  ongoing  2.  Patients will score > 42 on the BERG to show a decreased fall risk  Baseline:  Goal status: ongoing  3.  Patient will return to a full gym program  Baseline:  Goal status: MET  4.  Pt will be able to demonstrate ability to walk up and down ramps without freezing/fear of falling  in order to demonstrate functional improvement in balance/function for self-care and community ambulation   Baseline:  Goal status: Initial   PLAN:  PT FREQUENCY: 1x  PT DURATION: 12 wks (aim for D/C in 8)  PLANNED INTERVENTIONS: Therapeutic exercises, Therapeutic activity, Neuromuscular re-education, Balance training, Gait training, Patient/Family education, Self Care, Joint mobilization, Stair training, DME instructions, Aquatic Therapy, Spinal mobilization, Cryotherapy, Moist heat, Ultrasound, and Manual therapy  PLAN FOR NEXT SESSION:  outdoor balance as weather allows  Zebedee Iba PT, DPT 03/07/23 2:51 PM

## 2023-03-14 ENCOUNTER — Encounter (HOSPITAL_BASED_OUTPATIENT_CLINIC_OR_DEPARTMENT_OTHER): Payer: Self-pay | Admitting: Physical Therapy

## 2023-03-14 ENCOUNTER — Ambulatory Visit (HOSPITAL_BASED_OUTPATIENT_CLINIC_OR_DEPARTMENT_OTHER): Payer: Medicare Other | Attending: Family Medicine | Admitting: Physical Therapy

## 2023-03-14 DIAGNOSIS — M545 Low back pain, unspecified: Secondary | ICD-10-CM | POA: Insufficient documentation

## 2023-03-14 DIAGNOSIS — R296 Repeated falls: Secondary | ICD-10-CM

## 2023-03-14 DIAGNOSIS — R2681 Unsteadiness on feet: Secondary | ICD-10-CM | POA: Diagnosis not present

## 2023-03-14 DIAGNOSIS — M5459 Other low back pain: Secondary | ICD-10-CM

## 2023-03-14 DIAGNOSIS — M6281 Muscle weakness (generalized): Secondary | ICD-10-CM | POA: Diagnosis not present

## 2023-03-14 NOTE — Therapy (Addendum)
OUTPATIENT PHYSICAL THERAPY LOWER EXTREMITY TREATMENT     Patient Name: Richard Vaughn MRN: [id number] DOB:[birth year], 72 y.o., male Today's Date: 03/14/2023  END OF SESSION:  PT End of Session - 03/14/23 1452     Visit Number 25    Number of Visits 30    Date for PT Re-Evaluation 04/24/23    Authorization Type MCR    PT Start Time 1415    PT Stop Time 1442    PT Time Calculation (min) 27 min    Activity Tolerance Patient tolerated treatment well    Behavior During Therapy WFL for tasks assessed/performed                   Past Medical History:  Diagnosis Date   Barrett's esophagus    Chronic back pain    Depression    Gastritis    Mild   Gastroparesis    GERD (gastroesophageal reflux disease)    Headache    History of kidney stones    Hyperglycemia    Hypertension    Nephrolithiasis    Pneumonia    Covid pneumonia   Renal cyst    Sleep apnea    Stricture and stenosis of esophagus    Stroke (HCC) 07/2015   Vision abnormalities    Past Surgical History:  Procedure Laterality Date   CERVICAL DISC SURGERY  2012   COLONOSCOPY     LUMBAR DISC SURGERY  2005, 2010   replaced L4 and L5   SHOULDER ARTHROSCOPY WITH ROTATOR CUFF REPAIR Left 08/14/2020   Procedure: SHOULDER ARTHROSCOPY WITH ROTATOR CUFF REPAIR, SUBACROMIAL DECOMPRESSION;  Surgeon: Jones Broom, MD;  Location: [facility];  Service: Orthopedics;  Laterality: Left;   UPPER GASTROINTESTINAL ENDOSCOPY     Patient Active Problem List   Diagnosis Date Noted   Drug-induced constipation 06/08/2022   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) 12/09/2021   Depression, major, recurrent, mild (HCC)    Sleep disturbance    Urinary retention    Bradycardia    Essential hypertension    Temperature elevated    Left thyroid nodule    Treatment-emergent central sleep apnea 10/25/2018   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) 10/25/2018    Chronic pain syndrome    Chronic bilateral low back pain    Benign essential HTN    Tobacco abuse    Hyperlipidemia    History of CVA with residual deficit    Dysphagia, post-stroke    ICH (intracerebral hemorrhage) (HCC) - R thalmic/PLIC d/t HTN 69/62/9528   Insomnia 07/13/2017   PAD (peripheral artery disease) (HCC) 07/13/2017   Hyperlipidemia with target LDL less than 70 07/13/2017   Stroke-like symptom 09/22/2016   Paresthesia 09/22/2016   CVA (cerebral vascular accident) (HCC) 09/22/2016   Cerebrovascular accident (CVA) due to thrombosis of left carotid artery (HCC) 09/23/2015   Primary snoring 09/23/2015   Hypersomnia with sleep apnea 09/23/2015   Obstructive sleep apnea 09/23/2015   Lacunar infarct, acute (HCC) 08/25/2015   Sleep apnea 08/25/2015   Dysarthria    CVD (cardiovascular disease) 08/02/2015   Acute hyperglycemia 08/02/2015   Systolic hypertension with cerebrovascular disease 12/27/2014   Epistaxis 07/28/2012   NEPHROLITHIASIS 05/23/2009   BARRETTS ESOPHAGUS 02/20/2009   Gastroparesis 02/20/2009   RADICULOPATHY 10/21/2008   GERD 10/08/2008   ESOPHAGITIS 08/15/2008   ESOPHAGEAL STRICTURE 08/15/2008   GASTRITIS, ACUTE 08/15/2008   RENAL CYST 08/14/2008   TOBACCO ABUSE 08/08/2008   HEMATURIA,  MICROSCOPIC, HX OF 08/08/2008   PULMONARY NODULE 01/10/2008    PCP: Dr Betty [place]   REFERRING PROVIDER: Dr Betty [place]   REFERRING DIAG:  Diagnosis  R29.6 (ICD-10-CM) - Frequent falls    THERAPY DIAG:  Repeated falls  Other low back pain  Muscle weakness (generalized)  Unsteadiness on feet  Rationale for Evaluation and Treatment: Rehabilitation  ONSET DATE: increasing number of falls over the past few weeks  SUBJECTIVE:   SUBJECTIVE STATEMENT:  Pt states that everything is going well. He has not had a near fall since last session. Pt asks if there is anything he can do with his brain in order to improve.   Eval: The patient has a long history  of balance disturbances following a stroke in 2016. He reports over the past several months the balance deficits have increased. He has had 4 major falls. 3 of which were falling backwards. The other fall was from tripping over an object. He is using a cane right now 2nd to decreased ability to use his left arm.   PERTINENT HISTORY: Depression, chronic low back pain, Lumbar surgery 2005 , 2010 ; RTC repair 2021; CVA, HTN, PNA, Stroke 2016. Stroke effected Left UE,   PAIN:  Are you having pain? Yes: NPRS scale: 5/10 Pain location: middle of his lower back  Pain description: aching  Aggravating factors: bending  Relieving factors: rest   PRECAUTIONS: Fall  WEIGHT BEARING RESTRICTIONS: No  FALLS:  Has patient fallen in last 6 months? Yes. 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 and walking 2x a week  PLOF: Independent with use of a cane   PATIENT GOALS:  To improve balance   OBJECTIVE:   FOTO 42 pts 2/6  3/11 FOTO 45   BERG Balance Test                                              Date: 3/11   Sit to Stand 3  Standing unsupported 4  Sitting with back unsupported but feet supported 4  Stand to sit  3  Transfers  3  Standing unsupported with eyes closed 4  Standing unsupported feet together 4  From standing position, reach forward with outstretched arm 3  From standing position, pick up object from floor 4  From standing position, turn and look behind over each shoulder 4  Turn 360 3  Standing unsupported, alternately place foot on step 1  Standing unsupported, one foot in front 3  Standing on one leg 1  Total:  41/56     5xSTS: 45.2s  with UE assist  STS Transfers: improved fwd trunk lean during transition, reduction of posterior LOB, able to come to full standing independently  LOWER EXTREMITY MMT:   MMT Right eval R 3/11 Left eval L 3/11  Hip flexion 34.3 52.1 35.6  49.1  Hip extension        Hip abduction 47.0 53.5 41.7 53.6  Hip adduction        Hip internal rotation        Hip external rotation        Knee flexion        Knee extension 44.3 62.5 32.6 65.3  Ankle dorsiflexion  Ankle plantarflexion        Ankle inversion        Ankle eversion         (Blank rows = not tested)   TODAY'S TREATMENT:                                                                                                                               DATE:   4/29   Outdoor walking on uneven surfaces/sloped surfaces, step/stair management/sequencing; AD usage/assist positions from spouse with outdoor ambulation; Cog challenges with uneven surface navigation    03/07/23  Nu step Lvl 9 warm up 6 min UE and LE   6" box step down 3x6 6" box heel tap with UE support 4x8 Half foam roll step up and over 2x6 with L LE, 6x with R LE Standing stair alternating toe taps 2x20  4/8/224  Nu step Lvl 9 warm up 6 min UE and LE   Standing march 2x20  139/104 BP seated with R arm  HR 115  Session stopped due to uncontrolled emotional outbursts  Face symmetrical, no complaints paresthesia, speech slurred at baseline, no acute balance deficits  4/1/224   Nu step Lvl 9 warm up 6 min UE and LE   6" box step up 3x5 4" lateral step up on R 5x; L sided heel tap 3x5 Half foam roll step up and over 5x with L LE  HEP verbally updated to add in L LE stair taps with R UE support  PATIENT EDUCATION:  Education details:  balance/falls prevention, envelope of function, HEP, POC  Person educated: Patient Education method: Explanation, Demonstration, Tactile cues, Verbal cues Education comprehension: verbalized understanding, returned demonstration, verbal cues required, tactile cues required, and needs further education  HOME EXERCISE PROGRAM:  Access Code: [email or web address] URL: [email or web address] Date: 01/17/2023 Prepared by: Zebedee Iba  ASSESSMENT:  CLINICAL IMPRESSION:  Pt able to re-visit outdoor obstacle and uneven surface navigation with improvement success, especially on outdoor curb management. Pt demos improved L foot clearance. With cog challenge, pt does have moments of stopping and slowed gait. Pt advised to start adding in cog challenges at home when walking with wife in order to begin challenging his focus. Pt advised to continue with LE exercise as his L foot clearance with obstacle navigation has greatly improved in the outdoor setting. Continue with L LE eccentrics, obstacle navigation, and outdoor surface training as weather allows. Pt would benefit from continued skilled therapy in order to reach goals and maximize functional LE strength and balance for prevention of future falls and further functional decline.    OBJECTIVE IMPAIRMENTS: Abnormal gait, decreased activity tolerance, decreased knowledge of use of DME, decreased mobility, difficulty walking, decreased ROM, decreased strength, decreased safety awareness, increased fascial restrictions, increased muscle spasms, impaired flexibility, postural dysfunction, and pain.   ACTIVITY LIMITATIONS: carrying, lifting, bending, standing, squatting, stairs, transfers, bed mobility, reach over head, and locomotion level  PARTICIPATION  LIMITATIONS: meal prep, cleaning, laundry, driving, shopping, community activity, and yard work  PERSONAL FACTORS: 3+ comorbidities: Low back Surgery, frequent falls; Past CVA   are also affecting patient's functional outcome.   REHAB POTENTIAL: Good  CLINICAL DECISION MAKING: Evolving/moderate complexity increasing falls  EVALUATION COMPLEXITY: Moderate   GOALS: Goals reviewed with patient? Yes  SHORT TERM GOALS: Target date: 12/10/2022   Patient will decrease 5x sit to stand time by 10 seconds and before all 5 reps.  Baseline: Goal status: ongoing; able to complete 5  2.  Patient will increase left quad and glut  strength by 5 lbs  Baseline:  Goal status: MET  3.  Patient will transfer sit to stand with SBA and decreased use of his hands  Baseline:  Goal status: met   LONG TERM GOALS: Target date: 06/06/2023+    Patient will have no major falls over a month  Baseline:  Goal status: ongoing  2.  Patients will score > 42 on the BERG to show a decreased fall risk  Baseline:  Goal status: ongoing  3.  Patient will return to a full gym program  Baseline:  Goal status: MET  4.  Pt will be able to demonstrate ability to walk up and down ramps without freezing/fear of falling  in order to demonstrate functional improvement in balance/function for self-care and community ambulation   Baseline:  Goal status: Initial   PLAN:  PT FREQUENCY: 1x  PT DURATION: 12 wks (aim for D/C in 8)  PLANNED INTERVENTIONS: Therapeutic exercises, Therapeutic activity, Neuromuscular re-education, Balance training, Gait training, Patient/Family education, Self Care, Joint mobilization, Stair training, DME instructions, Aquatic Therapy, Spinal mobilization, Cryotherapy, Moist heat, Ultrasound, and Manual therapy  PLAN FOR NEXT SESSION:  outdoor balance as weather allows; indoor dynamic balance and L LE strength  Zebedee Iba PT, DPT 03/14/23 3:06 PM

## 2023-03-15 ENCOUNTER — Ambulatory Visit: Payer: Medicare Other | Admitting: Speech Pathology

## 2023-03-15 NOTE — Therapy (Deleted)
OUTPATIENT SPEECH LANGUAGE PATHOLOGY SWALLOW TREATMENT   Patient Name: Richard Vaughn MRN: [id number] DOB:Nov 29, 1950, 72 y.o., male Today's Date: 03/15/2023  PCP: [place], [name], MD REFERRING PROVIDER: Doree Albee, PA-C   END OF SESSION:    Past Medical History:  Diagnosis Date   Barrett's esophagus    Chronic back pain    Depression    Gastritis    Mild   Gastroparesis    GERD (gastroesophageal reflux disease)    Headache    History of kidney stones    Hyperglycemia    Hypertension    Nephrolithiasis    Pneumonia    Covid pneumonia   Renal cyst    Sleep apnea    Stricture and stenosis of esophagus    Stroke (HCC) 07/2015   Vision abnormalities    Past Surgical History:  Procedure Laterality Date   CERVICAL DISC SURGERY  2012   COLONOSCOPY     LUMBAR DISC SURGERY  2005, 2010   replaced L4 and L5   SHOULDER ARTHROSCOPY WITH ROTATOR CUFF REPAIR Left 08/14/2020   Procedure: SHOULDER ARTHROSCOPY WITH ROTATOR CUFF REPAIR, SUBACROMIAL DECOMPRESSION;  Surgeon: Jones Broom, MD;  Location: [facility];  Service: Orthopedics;  Laterality: Left;   UPPER GASTROINTESTINAL ENDOSCOPY     Patient Active Problem List   Diagnosis Date Noted   Drug-induced constipation 06/08/2022   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) 12/09/2021   Depression, major, recurrent, mild (HCC)    Sleep disturbance    Urinary retention    Bradycardia    Essential hypertension    Temperature elevated    Left thyroid nodule    Treatment-emergent central sleep apnea 10/25/2018   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) 10/25/2018   Chronic pain syndrome    Chronic bilateral low back pain    Benign essential HTN    Tobacco abuse    Hyperlipidemia    History of CVA with residual deficit    Dysphagia, post-stroke    ICH (intracerebral hemorrhage) (HCC) - R thalmic/PLIC d/t HTN 82/95/6213   Insomnia 07/13/2017   PAD  (peripheral artery disease) (HCC) 07/13/2017   Hyperlipidemia with target LDL less than 70 07/13/2017   Stroke-like symptom 09/22/2016   Paresthesia 09/22/2016   CVA (cerebral vascular accident) (HCC) 09/22/2016   Cerebrovascular accident (CVA) due to thrombosis of left carotid artery (HCC) 09/23/2015   Primary snoring 09/23/2015   Hypersomnia with sleep apnea 09/23/2015   Obstructive sleep apnea 09/23/2015   Lacunar infarct, acute (HCC) 08/25/2015   Sleep apnea 08/25/2015   Dysarthria    CVD (cardiovascular disease) 08/02/2015   Acute hyperglycemia 08/02/2015   Systolic hypertension with cerebrovascular disease 12/27/2014   Epistaxis 07/28/2012   NEPHROLITHIASIS 05/23/2009   BARRETTS ESOPHAGUS 02/20/2009   Gastroparesis 02/20/2009   RADICULOPATHY 10/21/2008   GERD 10/08/2008   ESOPHAGITIS 08/15/2008   ESOPHAGEAL STRICTURE 08/15/2008   GASTRITIS, ACUTE 08/15/2008   RENAL CYST 08/14/2008   TOBACCO ABUSE 08/08/2008   HEMATURIA, MICROSCOPIC, HX OF 08/08/2008   PULMONARY NODULE 01/10/2008    ONSET DATE: April 2021   REFERRING DIAG:  R13.12 (ICD-10-CM) - Oropharyngeal dysphagia  Z86.73 (ICD-10-CM) - History of stroke    THERAPY DIAG:  No diagnosis found.  Rationale for Evaluation and Treatment: Rehabilitation  SUBJECTIVE:   SUBJECTIVE STATEMENT: ***  PERTINENT HISTORY: 72 year old male with history of short segment Barrett's esophagus noted on EGD in 2017 and stable on most recent EGD in March 2020.  He has been on Nexium 40 mg once daily since have seen him (prescription actually says twice daily but he takes it once).  He reports good control of his reflux symptoms on the regimen and is happy with it. Recall the patient has a history of what he says of 3 strokes in the past. 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  He he is on 81 mg/day aspirin. He has been dealing with dysphagia now for some time.  When I asked him where he localizes this it seems more in his throat or  upper chest. His wife endorses that he coughs a lot when she eats meals with him, particularly at dinner.  He has been able to swallow his pills down okay but does have difficulty swallowing his foods.  PAIN:  Are you having pain? No  FALLS: Has patient fallen in last 6 months?  Yes, See PT evaluation for details  LIVING ENVIRONMENT: Lives with: lives with their family and lives with their spouse Lives in: House/apartment  PLOF:  Level of assistance: Needed assistance with ADLs Employment: Retired  PATIENT GOALS: "help me swallow"  OBJECTIVE:   TODAY'S TREATMENT:  03/15/23: ***  03-04-23: Pt did not bring EMST device with him this date. He did rate his recent perceived effort completing his EMST exercises at home as a 5 on a scale from 1 to 10. Plan to adjust his device next session and reminded him to bring it in next time.  Pt complete EAT-10 PROM with result of 12/40. (0 = no problem, 4 = severe problem). Pt rated 3 for "the pleasure of eating is affected by my swallowing" and 2 for "swallowing solids and pills takes extra effort".  Pt reported that he relies on his wife to remember to do his swallow exercises, although he has been trying to complete them twice a day. He has been doing EMST and two sets of 30 hard swallows , but not the [place]. He had no completed any exercises this date. SLP led pt through completing 20 hard swallows with mod-I and then 5 Mendelsohns. Re-educated pt on importance of completing his exercises to strengthen his swallow. Pt endorsed that he has been getting less choked up when drinking recently.   02-15-23: Educated pt on strategies and exercises to manage oropharyngeal dysphagia. Initiated EMST and provided education on purpose and benefit. SLP provided model and then pt demonstrated back. SLP educated pt on effortful swallow and Mendelsohn maneuver and provided model. Pt performed exercises given model.  HEP: Complete dysphagia exercises  Plan to  complete EAT-10 initial therapy session   PATIENT EDUCATION: Education details: See above Person educated: Patient and Spouse Education method: Explanation, Demonstration, and Handouts Education comprehension: verbalized understanding and needs further education   ASSESSMENT:  CLINICAL IMPRESSION: Pt reported that he has been mostly compliant with completing his exercises, although is not doing the [place] and relies on his wife to remember to do them. He has been completing 30 effortful swallows twice per day. He did not bring his EMST device this date. SLP led pt through completing 20 effortful swallows and 5 Mendelsohns with mod-I. Pt endorsed that recently he has been getting less choked up when eating and drinking. Pt continues to benefit from skilled ST to address oropharyngeal dysphagia and improve swallow safety.   OBJECTIVE IMPAIRMENTS: include dysphagia. These impairments are limiting patient from safety when swallowing. Factors affecting potential to achieve goals and functional outcome are previous level of function. Patient  will benefit from skilled SLP services to address above impairments and improve overall function.  REHAB POTENTIAL: Good   GOALS: Goals reviewed with patient? Yes  SHORT TERM GOALS: Target date: 03/15/2023    Pt will teach back dysphagia compensations given rare min-A.  Baseline: Goal status: IN PROGRESS  2.  Pt will accurately demonstrate dysphagia exercises with rare min-A Baseline:  Goal status: IN PROGRESS  3.  Pt will demonstrate swallow precautions with occasional min A  Baseline:  Goal status: IN PROGRESS   LONG TERM GOALS: Target date: 04/12/2023  Pt will complete follow-up swallow instrumental study to objectively evaluate swallow, if indicated Baseline:  Goal status: IN PROGRESS  2.  Pt will complete progressive dysphagia daily HEP over 2 week period with mod-I Baseline:  Goal status: IN PROGRESS  3.  Pt will report  carryover of swallowing strategies over 1 week period  Baseline:  Goal status: IN PROGRESS  4.  Pt will report subjective improvement of swallow function via PROM by d/c Baseline:  Goal status: IN PROGRESS  PLAN:  SLP FREQUENCY: 1x/week or 1x every other week - initiating at 1x every other week. May increase frequency based on pt response to interventions   SLP DURATION: 8 weeks  PLANNED INTERVENTIONS: SLP instruction and feedback, Compensatory strategies, Patient/family education, and dysphagia compensations and exercises     Maia Breslow, CCC-SLP 03/15/2023, 9:10 AM

## 2023-03-17 ENCOUNTER — Ambulatory Visit: Payer: Medicare Other | Attending: Physician Assistant | Admitting: Speech Pathology

## 2023-03-17 DIAGNOSIS — R1310 Dysphagia, unspecified: Secondary | ICD-10-CM | POA: Diagnosis not present

## 2023-03-17 NOTE — Therapy (Signed)
OUTPATIENT SPEECH LANGUAGE PATHOLOGY SWALLOW TREATMENT   Patient Name: Richard Vaughn MRN: [id number] DOB:1950-12-27, 72 y.o., male Today's Date: 03/17/2023  PCP: [place], [name], MD REFERRING PROVIDER: Doree Albee, PA-C   END OF SESSION:  End of Session - 03/17/23 1107     Visit Number 3    Number of Visits 5    Date for SLP Re-Evaluation 04/12/23    Authorization Type Medicare    SLP Start Time 1107    SLP Stop Time  1138    SLP Time Calculation (min) 31 min    Activity Tolerance Patient tolerated treatment well              Past Medical History:  Diagnosis Date   Barrett's esophagus    Chronic back pain    Depression    Gastritis    Mild   Gastroparesis    GERD (gastroesophageal reflux disease)    Headache    History of kidney stones    Hyperglycemia    Hypertension    Nephrolithiasis    Pneumonia    Covid pneumonia   Renal cyst    Sleep apnea    Stricture and stenosis of esophagus    Stroke (HCC) 07/2015   Vision abnormalities    Past Surgical History:  Procedure Laterality Date   CERVICAL DISC SURGERY  2012   COLONOSCOPY     LUMBAR DISC SURGERY  2005, 2010   replaced L4 and L5   SHOULDER ARTHROSCOPY WITH ROTATOR CUFF REPAIR Left 08/14/2020   Procedure: SHOULDER ARTHROSCOPY WITH ROTATOR CUFF REPAIR, SUBACROMIAL DECOMPRESSION;  Surgeon: Jones Broom, MD;  Location: [facility];  Service: Orthopedics;  Laterality: Left;   UPPER GASTROINTESTINAL ENDOSCOPY     Patient Active Problem List   Diagnosis Date Noted   Drug-induced constipation 06/08/2022   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) 12/09/2021   Depression, major, recurrent, mild (HCC)    Sleep disturbance    Urinary retention    Bradycardia    Essential hypertension    Temperature elevated    Left thyroid nodule    Treatment-emergent central sleep apnea 10/25/2018   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) 10/25/2018    Chronic pain syndrome    Chronic bilateral low back pain    Benign essential HTN    Tobacco abuse    Hyperlipidemia    History of CVA with residual deficit    Dysphagia, post-stroke    ICH (intracerebral hemorrhage) (HCC) - R thalmic/PLIC d/t HTN 78/29/5621   Insomnia 07/13/2017   PAD (peripheral artery disease) (HCC) 07/13/2017   Hyperlipidemia with target LDL less than 70 07/13/2017   Stroke-like symptom 09/22/2016   Paresthesia 09/22/2016   CVA (cerebral vascular accident) (HCC) 09/22/2016   Cerebrovascular accident (CVA) due to thrombosis of left carotid artery (HCC) 09/23/2015   Primary snoring 09/23/2015   Hypersomnia with sleep apnea 09/23/2015   Obstructive sleep apnea 09/23/2015   Lacunar infarct, acute (HCC) 08/25/2015   Sleep apnea 08/25/2015   Dysarthria    CVD (cardiovascular disease) 08/02/2015   Acute hyperglycemia 08/02/2015   Systolic hypertension with cerebrovascular disease 12/27/2014   Epistaxis 07/28/2012   NEPHROLITHIASIS 05/23/2009   BARRETTS ESOPHAGUS 02/20/2009   Gastroparesis 02/20/2009   RADICULOPATHY 10/21/2008   GERD 10/08/2008   ESOPHAGITIS 08/15/2008   ESOPHAGEAL STRICTURE 08/15/2008   GASTRITIS, ACUTE 08/15/2008   RENAL CYST 08/14/2008   TOBACCO ABUSE 08/08/2008   HEMATURIA, MICROSCOPIC, HX OF 08/08/2008  PULMONARY NODULE 01/10/2008    ONSET DATE: April 2021   REFERRING DIAG:  R13.12 (ICD-10-CM) - Oropharyngeal dysphagia  Z86.73 (ICD-10-CM) - History of stroke    THERAPY DIAG:  Dysphagia, unspecified type  Rationale for Evaluation and Treatment: Rehabilitation  SUBJECTIVE:   SUBJECTIVE STATEMENT: Pt report to feeling a little better"   PERTINENT HISTORY: 72 year old male with history of short segment Barrett's esophagus noted on EGD in 2017 and stable on most recent EGD in March 2020. He has been on Nexium 40 mg once daily since have seen him (prescription actually says twice daily but he takes it once).  He reports good control  of his reflux symptoms on the regimen and is happy with it. Recall the patient has a history of what he says of 3 strokes in the past. 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  He he is on 81 mg/day aspirin. He has been dealing with dysphagia now for some time.  When I asked him where he localizes this it seems more in his throat or upper chest. His wife endorses that he coughs a lot when she eats meals with him, particularly at dinner.  He has been able to swallow his pills down okay but does have difficulty swallowing his foods.  PAIN:  Are you having pain? No  FALLS: Has patient fallen in last 6 months?  Yes, See PT evaluation for details  LIVING ENVIRONMENT: Lives with: lives with their family and lives with their spouse Lives in: House/apartment  PLOF:  Level of assistance: Needed assistance with ADLs Employment: Retired  PATIENT GOALS: "help me swallow"  OBJECTIVE:   TODAY'S TREATMENT:  03/17/23: Pt not completing HEP, awareness evidenced re: limited progress to be expected without completion. SLP assisted pt in generating compensations to aid in improved HEP compliance. Pt ID during watching TV to be time he'd be able to complete. Visual aid and checklist provided to support home practice. Increased pressure for EMST based on perceived effort level. Led pt through EMST with min verbal cues to aid in completion. Reviewed recommendations to achieve 50 hard swallows during practice with pt verbalizing understanding.   03-04-23: Pt did not bring EMST device with him this date. He did rate his recent perceived effort completing his EMST exercises at home as a 5 on a scale from 1 to 10. Plan to adjust his device next session and reminded him to bring it in next time.  Pt complete EAT-10 PROM with result of 12/40. (0 = no problem, 4 = severe problem). Pt rated 3 for "the pleasure of eating is affected by my swallowing" and 2 for "swallowing solids and pills takes extra effort".  Pt reported  that he relies on his wife to remember to do his swallow exercises, although he has been trying to complete them twice a day. He has been doing EMST and two sets of 30 hard swallows , but not the [place]. He had no completed any exercises this date. SLP led pt through completing 20 hard swallows with mod-I and then 5 Mendelsohns. Re-educated pt on importance of completing his exercises to strengthen his swallow. Pt endorsed that he has been getting less choked up when drinking recently.   02-15-23: Educated pt on strategies and exercises to manage oropharyngeal dysphagia. Initiated EMST and provided education on purpose and benefit. SLP provided model and then pt demonstrated back. SLP educated pt on effortful swallow and Mendelsohn maneuver and provided model. Pt performed exercises given model.  HEP: Complete dysphagia exercises  Plan to complete EAT-10 initial therapy session   PATIENT EDUCATION: Education details: See above Person educated: Patient and Spouse Education method: Explanation, Demonstration, and Handouts Education comprehension: verbalized understanding and needs further education   ASSESSMENT:  CLINICAL IMPRESSION: Pt reported that he has been mostly compliant with completing his exercises, although is not doing the [place] and relies on his wife to remember to do them. He has been completing 30 effortful swallows twice per day. He did not bring his EMST device this date. SLP led pt through completing 20 effortful swallows and 5 Mendelsohns with mod-I. Pt endorsed that recently he has been getting less choked up when eating and drinking. Pt continues to benefit from skilled ST to address oropharyngeal dysphagia and improve swallow safety.   OBJECTIVE IMPAIRMENTS: include dysphagia. These impairments are limiting patient from safety when swallowing. Factors affecting potential to achieve goals and functional outcome are previous level of function. Patient will benefit from  skilled SLP services to address above impairments and improve overall function.  REHAB POTENTIAL: Good   GOALS: Goals reviewed with patient? Yes  SHORT TERM GOALS: Target date: 03/15/2023    Pt will teach back dysphagia compensations given rare min-A.  Baseline: Goal status: IN PROGRESS  2.  Pt will accurately demonstrate dysphagia exercises with rare min-A Baseline:  Goal status: IN PROGRESS  3.  Pt will demonstrate swallow precautions with occasional min A  Baseline:  Goal status: IN PROGRESS   LONG TERM GOALS: Target date: 04/12/2023  Pt will complete follow-up swallow instrumental study to objectively evaluate swallow, if indicated Baseline:  Goal status: IN PROGRESS  2.  Pt will complete progressive dysphagia daily HEP over 2 week period with mod-I Baseline:  Goal status: IN PROGRESS  3.  Pt will report carryover of swallowing strategies over 1 week period  Baseline:  Goal status: IN PROGRESS  4.  Pt will report subjective improvement of swallow function via PROM by d/c Baseline:  Goal status: IN PROGRESS  PLAN:  SLP FREQUENCY: 1x/week or 1x every other week - initiating at 1x every other week. May increase frequency based on pt response to interventions   SLP DURATION: 8 weeks  PLANNED INTERVENTIONS: SLP instruction and feedback, Compensatory strategies, Patient/family education, and dysphagia compensations and exercises     Maia Breslow, CCC-SLP 03/17/2023, 12:39 PM

## 2023-03-21 ENCOUNTER — Encounter (HOSPITAL_BASED_OUTPATIENT_CLINIC_OR_DEPARTMENT_OTHER): Payer: Self-pay | Admitting: Physical Therapy

## 2023-03-21 ENCOUNTER — Ambulatory Visit (HOSPITAL_BASED_OUTPATIENT_CLINIC_OR_DEPARTMENT_OTHER): Payer: Medicare Other | Attending: Family Medicine | Admitting: Physical Therapy

## 2023-03-21 DIAGNOSIS — M5459 Other low back pain: Secondary | ICD-10-CM | POA: Diagnosis not present

## 2023-03-21 DIAGNOSIS — M6281 Muscle weakness (generalized): Secondary | ICD-10-CM | POA: Diagnosis not present

## 2023-03-21 DIAGNOSIS — R296 Repeated falls: Secondary | ICD-10-CM | POA: Diagnosis not present

## 2023-03-21 NOTE — Therapy (Signed)
OUTPATIENT PHYSICAL THERAPY LOWER EXTREMITY TREATMENT     Patient Name: Richard Vaughn MRN: [id number] DOB:05-20-1951, 72 y.o., male Today's Date: 03/21/2023  END OF SESSION:  PT End of Session - 03/21/23 1413     Visit Number 26    Number of Visits 30    Date for PT Re-Evaluation 04/24/23    Authorization Type MCR    PT Start Time 1400    PT Stop Time 1440    PT Time Calculation (min) 40 min    Activity Tolerance Patient tolerated treatment well    Behavior During Therapy WFL for tasks assessed/performed                   Past Medical History:  Diagnosis Date   Barrett's esophagus    Chronic back pain    Depression    Gastritis    Mild   Gastroparesis    GERD (gastroesophageal reflux disease)    Headache    History of kidney stones    Hyperglycemia    Hypertension    Nephrolithiasis    Pneumonia    Covid pneumonia   Renal cyst    Sleep apnea    Stricture and stenosis of esophagus    Stroke (HCC) 07/2015   Vision abnormalities    Past Surgical History:  Procedure Laterality Date   CERVICAL DISC SURGERY  2012   COLONOSCOPY     LUMBAR DISC SURGERY  2005, 2010   replaced L4 and L5   SHOULDER ARTHROSCOPY WITH ROTATOR CUFF REPAIR Left 08/14/2020   Procedure: SHOULDER ARTHROSCOPY WITH ROTATOR CUFF REPAIR, SUBACROMIAL DECOMPRESSION;  Surgeon: Jones Broom, MD;  Location: [facility];  Service: Orthopedics;  Laterality: Left;   UPPER GASTROINTESTINAL ENDOSCOPY     Patient Active Problem List   Diagnosis Date Noted   Drug-induced constipation 06/08/2022   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) 12/09/2021   Depression, major, recurrent, mild (HCC)    Sleep disturbance    Urinary retention    Bradycardia    Essential hypertension    Temperature elevated    Left thyroid nodule    Treatment-emergent central sleep apnea 10/25/2018   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) 10/25/2018    Chronic pain syndrome    Chronic bilateral low back pain    Benign essential HTN    Tobacco abuse    Hyperlipidemia    History of CVA with residual deficit    Dysphagia, post-stroke    ICH (intracerebral hemorrhage) (HCC) - R thalmic/PLIC d/t HTN 40/98/1191   Insomnia 07/13/2017   PAD (peripheral artery disease) (HCC) 07/13/2017   Hyperlipidemia with target LDL less than 70 07/13/2017   Stroke-like symptom 09/22/2016   Paresthesia 09/22/2016   CVA (cerebral vascular accident) (HCC) 09/22/2016   Cerebrovascular accident (CVA) due to thrombosis of left carotid artery (HCC) 09/23/2015   Primary snoring 09/23/2015   Hypersomnia with sleep apnea 09/23/2015   Obstructive sleep apnea 09/23/2015   Lacunar infarct, acute (HCC) 08/25/2015   Sleep apnea 08/25/2015   Dysarthria    CVD (cardiovascular disease) 08/02/2015   Acute hyperglycemia 08/02/2015   Systolic hypertension with cerebrovascular disease 12/27/2014   Epistaxis 07/28/2012   NEPHROLITHIASIS 05/23/2009   BARRETTS ESOPHAGUS 02/20/2009   Gastroparesis 02/20/2009   RADICULOPATHY 10/21/2008   GERD 10/08/2008   ESOPHAGITIS 08/15/2008   ESOPHAGEAL STRICTURE 08/15/2008   GASTRITIS, ACUTE 08/15/2008   RENAL CYST 08/14/2008   TOBACCO ABUSE 08/08/2008   HEMATURIA,  MICROSCOPIC, HX OF 08/08/2008   PULMONARY NODULE 01/10/2008    PCP: Dr Betty [place]   REFERRING PROVIDER: Dr Betty [place]   REFERRING DIAG:  Diagnosis  R29.6 (ICD-10-CM) - Frequent falls    THERAPY DIAG:  Repeated falls  Muscle weakness (generalized)  Other low back pain  Rationale for Evaluation and Treatment: Rehabilitation  ONSET DATE: increasing number of falls over the past few weeks  SUBJECTIVE:   SUBJECTIVE STATEMENT:  Pt states that everything is going well. He has not had a near fall since last session. Pt has been incorporating more mental challenges with walking but notices more stopping/pausing with walking due to that.  Eval: The patient  has a long history of balance disturbances following a stroke in 2016. He reports over the past several months the balance deficits have increased. He has had 4 major falls. 3 of which were falling backwards. The other fall was from tripping over an object. He is using a cane right now 2nd to decreased ability to use his left arm.   PERTINENT HISTORY: Depression, chronic low back pain, Lumbar surgery 2005 , 2010 ; RTC repair 2021; CVA, HTN, PNA, Stroke 2016. Stroke effected Left UE,   PAIN:  Are you having pain? Yes: NPRS scale: 5/10 Pain location: middle of his lower back  Pain description: aching  Aggravating factors: bending  Relieving factors: rest   PRECAUTIONS: Fall  WEIGHT BEARING RESTRICTIONS: No  FALLS:  Has patient fallen in last 6 months? Yes. 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 and walking 2x a week  PLOF: Independent with use of a cane   PATIENT GOALS:  To improve balance   OBJECTIVE:   FOTO 42 pts 2/6  3/11 FOTO 45   BERG Balance Test                                              Date: 3/11   Sit to Stand 3  Standing unsupported 4  Sitting with back unsupported but feet supported 4  Stand to sit  3  Transfers  3  Standing unsupported with eyes closed 4  Standing unsupported feet together 4  From standing position, reach forward with outstretched arm 3  From standing position, pick up object from floor 4  From standing position, turn and look behind over each shoulder 4  Turn 360 3  Standing unsupported, alternately place foot on step 1  Standing unsupported, one foot in front 3  Standing on one leg 1  Total:  41/56     5xSTS: 45.2s  with UE assist  STS Transfers: improved fwd trunk lean during transition, reduction of posterior LOB, able to come to full standing independently  LOWER EXTREMITY MMT:   MMT Right eval R 3/11 Left eval L 3/11  Hip flexion  34.3 52.1 35.6 49.1  Hip extension        Hip abduction 47.0 53.5 41.7 53.6  Hip adduction        Hip internal rotation        Hip external rotation        Knee flexion        Knee extension 44.3 62.5 32.6 65.3  Ankle dorsiflexion        Ankle plantarflexion  Ankle inversion        Ankle eversion         (Blank rows = not tested)   TODAY'S TREATMENT:                                                                                                                               DATE:   5/6  Nu step Lvl 9 warm up 6 min UE and LE   6" box step down 3x6 6" box heel tap with UE support 4x8 STS 2x8 L LE balance with R toe touch on airex 30s 3x   Sidestepping in hallway with and without cog challenge 3x laps    4/29   Outdoor walking on uneven surfaces/sloped surfaces, step/stair management/sequencing; AD usage/assist positions from spouse with outdoor ambulation; Cog challenges with uneven surface navigation    03/07/23  Nu step Lvl 9 warm up 6 min UE and LE   6" box step down 3x6 6" box heel tap with UE support 4x8 Half foam roll step up and over 2x6 with L LE, 6x with R LE Standing stair alternating toe taps 2x20  4/8/224  Nu step Lvl 9 warm up 6 min UE and LE   Standing march 2x20  139/104 BP seated with R arm  HR 115  Session stopped due to uncontrolled emotional outbursts  Face symmetrical, no complaints paresthesia, speech slurred at baseline, no acute balance deficits  4/1/224   Nu step Lvl 9 warm up 6 min UE and LE   6" box step up 3x5 4" lateral step up on R 5x; L sided heel tap 3x5 Half foam roll step up and over 5x with L LE  HEP verbally updated to add in L LE stair taps with R UE support  PATIENT EDUCATION:  Education details:  balance/falls prevention, envelope of function, HEP, POC  Person educated: Patient Education method: Explanation, Demonstration, Tactile cues, Verbal cues Education comprehension: verbalized understanding,  returned demonstration, verbal cues required, tactile cues required, and needs further education  HOME EXERCISE PROGRAM:  Access Code: [email or web address] URL: [email or web address] Date: 01/17/2023 Prepared by: Zebedee Iba  ASSESSMENT:  CLINICAL IMPRESSION:  Pt session focused on controlled L LE mechanics in addition to dynamic gait challenges with cognitive multitasking. Pt continues to slow/stop with cog challenge but no increase in LOB. Plan for PN at next visit for assessment of continuation with therapy. Continue with L LE eccentrics, obstacle navigation, and outdoor surface training as weather allows. Pt would benefit from continued skilled therapy in order to reach goals and maximize functional LE strength and balance for prevention of future falls and further functional decline.    OBJECTIVE IMPAIRMENTS: Abnormal gait, decreased activity tolerance, decreased knowledge of use of DME, decreased mobility, difficulty walking, decreased ROM, decreased strength, decreased safety awareness, increased fascial restrictions, increased muscle spasms, impaired flexibility, postural dysfunction, and pain.   ACTIVITY LIMITATIONS: carrying, lifting, bending, standing, squatting, stairs, transfers, bed  mobility, reach over head, and locomotion level  PARTICIPATION LIMITATIONS: meal prep, cleaning, laundry, driving, shopping, community activity, and yard work  PERSONAL FACTORS: 3+ comorbidities: Low back Surgery, frequent falls; Past CVA   are also affecting patient's functional outcome.   REHAB POTENTIAL: Good  CLINICAL DECISION MAKING: Evolving/moderate complexity increasing falls  EVALUATION COMPLEXITY: Moderate   GOALS: Goals reviewed with patient? Yes  SHORT TERM GOALS: Target date: 12/10/2022   Patient will decrease 5x sit to stand time by 10 seconds and before all 5 reps.  Baseline: Goal status: ongoing; able to complete 5  2.  Patient will increase left quad and glut  strength by 5 lbs  Baseline:  Goal status: MET  3.  Patient will transfer sit to stand with SBA and decreased use of his hands  Baseline:  Goal status: met   LONG TERM GOALS: Target date: 06/13/2023+    Patient will have no major falls over a month  Baseline:  Goal status: ongoing  2.  Patients will score > 42 on the BERG to show a decreased fall risk  Baseline:  Goal status: ongoing  3.  Patient will return to a full gym program  Baseline:  Goal status: MET  4.  Pt will be able to demonstrate ability to walk up and down ramps without freezing/fear of falling  in order to demonstrate functional improvement in balance/function for self-care and community ambulation   Baseline:  Goal status: Initial   PLAN:  PT FREQUENCY: 1x  PT DURATION: 12 wks (aim for D/C in 8)  PLANNED INTERVENTIONS: Therapeutic exercises, Therapeutic activity, Neuromuscular re-education, Balance training, Gait training, Patient/Family education, Self Care, Joint mobilization, Stair training, DME instructions, Aquatic Therapy, Spinal mobilization, Cryotherapy, Moist heat, Ultrasound, and Manual therapy  PLAN FOR NEXT SESSION:  outdoor balance as weather allows; indoor dynamic balance and L LE strength  Zebedee Iba PT, DPT 03/21/23 2:51 PM

## 2023-03-29 ENCOUNTER — Ambulatory Visit (HOSPITAL_BASED_OUTPATIENT_CLINIC_OR_DEPARTMENT_OTHER): Payer: Medicare Other | Admitting: Physical Therapy

## 2023-03-29 ENCOUNTER — Encounter (HOSPITAL_BASED_OUTPATIENT_CLINIC_OR_DEPARTMENT_OTHER): Payer: Self-pay | Admitting: Physical Therapy

## 2023-03-29 ENCOUNTER — Ambulatory Visit: Payer: Medicare Other | Admitting: Speech Pathology

## 2023-03-29 DIAGNOSIS — R296 Repeated falls: Secondary | ICD-10-CM | POA: Diagnosis not present

## 2023-03-29 DIAGNOSIS — M5459 Other low back pain: Secondary | ICD-10-CM

## 2023-03-29 DIAGNOSIS — M6281 Muscle weakness (generalized): Secondary | ICD-10-CM | POA: Diagnosis not present

## 2023-03-29 NOTE — Therapy (Signed)
OUTPATIENT PHYSICAL THERAPY LOWER EXTREMITY TREATMENT  PHYSICAL THERAPY DISCHARGE SUMMARY  Visits from Start of Care: 26  Plan: Patient agrees to discharge.  Patient goals were mostly met. Patient is being discharged due to reaching max potential with current therapy.          Patient Name: Richard Vaughn MRN: [id number] DOB:27-Nov-1950, 72 y.o., male Today's Date: 03/29/2023  END OF SESSION:  PT End of Session - 03/29/23 1408     Visit Number 27    Number of Visits 30    Date for PT Re-Evaluation 04/24/23    Authorization Type MCR    PT Start Time 1405    PT Stop Time 1436    PT Time Calculation (min) 31 min    Activity Tolerance Patient tolerated treatment well    Behavior During Therapy WFL for tasks assessed/performed                   Past Medical History:  Diagnosis Date   Barrett's esophagus    Chronic back pain    Depression    Gastritis    Mild   Gastroparesis    GERD (gastroesophageal reflux disease)    Headache    History of kidney stones    Hyperglycemia    Hypertension    Nephrolithiasis    Pneumonia    Covid pneumonia   Renal cyst    Sleep apnea    Stricture and stenosis of esophagus    Stroke (HCC) 07/2015   Vision abnormalities    Past Surgical History:  Procedure Laterality Date   CERVICAL DISC SURGERY  2012   COLONOSCOPY     LUMBAR DISC SURGERY  2005, 2010   replaced L4 and L5   SHOULDER ARTHROSCOPY WITH ROTATOR CUFF REPAIR Left 08/14/2020   Procedure: SHOULDER ARTHROSCOPY WITH ROTATOR CUFF REPAIR, SUBACROMIAL DECOMPRESSION;  Surgeon: Jones Broom, MD;  Location: [facility];  Service: Orthopedics;  Laterality: Left;   UPPER GASTROINTESTINAL ENDOSCOPY     Patient Active Problem List   Diagnosis Date Noted   Drug-induced constipation 06/08/2022   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) 12/09/2021   Depression, major, recurrent, mild (HCC)    Sleep disturbance    Urinary retention     Bradycardia    Essential hypertension    Temperature elevated    Left thyroid nodule    Treatment-emergent central sleep apnea 10/25/2018   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) 10/25/2018   Chronic pain syndrome    Chronic bilateral low back pain    Benign essential HTN    Tobacco abuse    Hyperlipidemia    History of CVA with residual deficit    Dysphagia, post-stroke    ICH (intracerebral hemorrhage) (HCC) - R thalmic/PLIC d/t HTN 78/29/5621   Insomnia 07/13/2017   PAD (peripheral artery disease) (HCC) 07/13/2017   Hyperlipidemia with target LDL less than 70 07/13/2017   Stroke-like symptom 09/22/2016   Paresthesia 09/22/2016   CVA (cerebral vascular accident) (HCC) 09/22/2016   Cerebrovascular accident (CVA) due to thrombosis of left carotid artery (HCC) 09/23/2015   Primary snoring 09/23/2015   Hypersomnia with sleep apnea 09/23/2015   Obstructive sleep apnea 09/23/2015   Lacunar infarct, acute (HCC) 08/25/2015   Sleep apnea 08/25/2015   Dysarthria    CVD (cardiovascular disease) 08/02/2015   Acute hyperglycemia 08/02/2015   Systolic hypertension with cerebrovascular disease 12/27/2014   Epistaxis 07/28/2012   NEPHROLITHIASIS 05/23/2009   BARRETTS  ESOPHAGUS 02/20/2009   Gastroparesis 02/20/2009   RADICULOPATHY 10/21/2008   GERD 10/08/2008   ESOPHAGITIS 08/15/2008   ESOPHAGEAL STRICTURE 08/15/2008   GASTRITIS, ACUTE 08/15/2008   RENAL CYST 08/14/2008   TOBACCO ABUSE 08/08/2008   HEMATURIA, MICROSCOPIC, HX OF 08/08/2008   PULMONARY NODULE 01/10/2008    PCP: Dr Betty [place]   REFERRING PROVIDER: Dr Betty [place]   REFERRING DIAG:  Diagnosis  R29.6 (ICD-10-CM) - Frequent falls    THERAPY DIAG:  Repeated falls  Muscle weakness (generalized)  Other low back pain  Rationale for Evaluation and Treatment: Rehabilitation  ONSET DATE: increasing number of falls over the past few weeks  SUBJECTIVE:   SUBJECTIVE STATEMENT:  Pt  states he has had no falls since last session. He is riding his bike nearly daily for exercise. He still feels similar with dynamic balance while out walking. Pt states he feels like things have plateaued.  Eval: The patient has a long history of balance disturbances following a stroke in 2016. He reports over the past several months the balance deficits have increased. He has had 4 major falls. 3 of which were falling backwards. The other fall was from tripping over an object. He is using a cane right now 2nd to decreased ability to use his left arm.   PERTINENT HISTORY: Depression, chronic low back pain, Lumbar surgery 2005 , 2010 ; RTC repair 2021; CVA, HTN, PNA, Stroke 2016. Stroke effected Left UE,   PAIN:  Are you having pain? Yes: NPRS scale: 5/10 Pain location: middle of his lower back  Pain description: aching  Aggravating factors: bending  Relieving factors: rest   PRECAUTIONS: Fall  WEIGHT BEARING RESTRICTIONS: No  FALLS:  Has patient fallen in last 6 months? Yes. 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 and walking 2x a week  PLOF: Independent with use of a cane   PATIENT GOALS:  To improve balance   OBJECTIVE:   FOTO 42 pts 2/6  3/11 FOTO 45  5/14 FOTO 44   BERG Balance Test                                              Date: 3/11   Sit to Stand 3  Standing unsupported 4  Sitting with back unsupported but feet supported 4  Stand to sit  4  Transfers  3  Standing unsupported with eyes closed 4  Standing unsupported feet together 4  From standing position, reach forward with outstretched arm 3  From standing position, pick up object from floor 4  From standing position, turn and look behind over each shoulder 4  Turn 360 3  Standing unsupported, alternately place foot on step 2  Standing unsupported, one foot in front 3  Standing on one leg 1  Total:  44/56      5xSTS: 46.5s with UE assist   LOWER EXTREMITY MMT:   MMT Right eval R 3/11 Left eval L 3/11  Hip flexion 34.3 52.1 35.6 49.1  Hip extension        Hip abduction 47.0 53.5 41.7 53.6  Hip adduction        Hip internal rotation        Hip external rotation  Knee flexion        Knee extension 44.3 62.5 32.6 65.3  Ankle dorsiflexion        Ankle plantarflexion        Ankle inversion        Ankle eversion         (Blank rows = not tested)   TODAY'S TREATMENT:                                                                                                                               DATE:   5/14 Program Notes Perform with wife present; Use hands for support  Exercises - Gastroc Stretch on Wall  - 2 x daily - 7 x weekly - 1 sets - 3 reps - 30 hold - Heel Raise/Toe Raise  - 1 x daily - 7 x weekly - 2 sets - 10 reps - Standing March with Counter Support  - 1 x daily - 7 x weekly - 2 sets - 10 reps - Sit to Stand with Armchair  - 1 x daily - 7 x weekly - 3 sets - 5 reps - Step Taps on High Step  - 1 x daily - 7 x weekly - 2 sets - 10 reps - Lateral Step Down  - 1 x daily - 7 x weekly - 2 sets - 10 reps   5/6  Nu step Lvl 9 warm up 6 min UE and LE   6" box step down 3x6 6" box heel tap with UE support 4x8 STS 2x8 L LE balance with R toe touch on airex 30s 3x   Sidestepping in hallway with and without cog challenge 3x laps    4/29   Outdoor walking on uneven surfaces/sloped surfaces, step/stair management/sequencing; AD usage/assist positions from spouse with outdoor ambulation; Cog challenges with uneven surface navigation    03/07/23  Nu step Lvl 9 warm up 6 min UE and LE   6" box step down 3x6 6" box heel tap with UE support 4x8 Half foam roll step up and over 2x6 with L LE, 6x with R LE Standing stair alternating toe taps 2x20  4/8/224  Nu step Lvl 9 warm up 6 min UE and LE   Standing march 2x20  139/104 BP seated with R arm  HR 115  Session  stopped due to uncontrolled emotional outbursts  Face symmetrical, no complaints paresthesia, speech slurred at baseline, no acute balance deficits  4/1/224   Nu step Lvl 9 warm up 6 min UE and LE   6" box step up 3x5 4" lateral step up on R 5x; L sided heel tap 3x5 Half foam roll step up and over 5x with L LE  HEP verbally updated to add in L LE stair taps with R UE support  PATIENT EDUCATION:  Education details:  balance/falls prevention, envelope of function, HEP, POC  Person educated: Patient Education method: Explanation, Demonstration, Tactile cues, Verbal  cues Education comprehension: verbalized understanding, returned demonstration, verbal cues required, tactile cues required, and needs further education  HOME EXERCISE PROGRAM:  Access Code: [email or web address] URL: [email or web address] Date: 01/17/2023 Prepared by: Zebedee Iba  ASSESSMENT:  CLINICAL IMPRESSION:  Pt with similar subjective and objective testing at today's session. Pt appears to have met functional plateau with PT and pt agrees to D/C current episode of care. Pt is currently working out in [employer] and is continuing with HEP.  Pt advised on contiuation of exercise to prevent functional decline. Of note, pt has reduce number of falls since starting therapy and has better strategies for fall prevention. D/C this episode.  OBJECTIVE IMPAIRMENTS: Abnormal gait, decreased activity tolerance, decreased knowledge of use of DME, decreased mobility, difficulty walking, decreased ROM, decreased strength, decreased safety awareness, increased fascial restrictions, increased muscle spasms, impaired flexibility, postural dysfunction, and pain.   ACTIVITY LIMITATIONS: carrying, lifting, bending, standing, squatting, stairs, transfers, bed mobility, reach over head, and locomotion level  PARTICIPATION LIMITATIONS: meal prep, cleaning, laundry, driving, shopping, community activity, and yard work  PERSONAL FACTORS:  3+ comorbidities: Low back Surgery, frequent falls; Past CVA   are also affecting patient's functional outcome.   REHAB POTENTIAL: Good  CLINICAL DECISION MAKING: Evolving/moderate complexity increasing falls  EVALUATION COMPLEXITY: Moderate   GOALS: Goals reviewed with patient? Yes  SHORT TERM GOALS: Target date: 12/10/2022   Patient will decrease 5x sit to stand time by 10 seconds and before all 5 reps.  Baseline: Goal status: not met  2.  Patient will increase left quad and glut strength by 5 lbs  Baseline:  Goal status: MET  3.  Patient will transfer sit to stand with SBA and decreased use of his hands  Baseline:  Goal status: met   LONG TERM GOALS: Target date: 06/21/2023+    Patient will have no major falls over a month  Baseline:  Goal status: met  2.  Patients will score > 42 on the BERG to show a decreased fall risk  Baseline:  Goal status: met  3.  Patient will return to a full gym program  Baseline:  Goal status: MET  4.  Pt will be able to demonstrate ability to walk up and down ramps without freezing/fear of falling  in order to demonstrate functional improvement in balance/function for self-care and community ambulation   Baseline:  Goal status: partially met   PLAN:  PT FREQUENCY: 1x  PT DURATION: 12 wks (aim for D/C in 8)  PLANNED INTERVENTIONS: Therapeutic exercises, Therapeutic activity, Neuromuscular re-education, Balance training, Gait training, Patient/Family education, Self Care, Joint mobilization, Stair training, DME instructions, Aquatic Therapy, Spinal mobilization, Cryotherapy, Moist heat, Ultrasound, and Manual therapy  PLAN FOR NEXT SESSION:  outdoor balance as weather allows; indoor dynamic balance and L LE strength  Zebedee Iba PT, DPT 03/29/23 2:44 PM

## 2023-03-30 ENCOUNTER — Ambulatory Visit: Payer: Medicare Other | Admitting: Speech Pathology

## 2023-03-30 DIAGNOSIS — R1310 Dysphagia, unspecified: Secondary | ICD-10-CM | POA: Diagnosis not present

## 2023-03-30 NOTE — Patient Instructions (Signed)
Below is our plan:  We will continue to monitor. Continue ST as directed. Continue regular physical and mental exercise. Follow up closely with your PCP. I recommend keeping your BP lesss than 130/90. Keep your LDL less than 70.   Please make sure you are staying well hydrated. I recommend 50-60 ounces daily. Well balanced diet and regular exercise encouraged. Consistent sleep schedule with 6-8 hours recommended.   Please continue follow up with care team as directed.   Follow up with Richard Vaughn in 6 months   You may receive a survey regarding today's visit. I encourage you to leave honest feed back as I do use this information to improve patient care. Thank you for seeing me today!   Management of Memory Problems   There are some general things you can do to help manage your memory problems.  Your memory may not in fact recover, but by using techniques and strategies you will be able to manage your memory difficulties better.   1)  Establish a routine. Try to establish and then stick to a regular routine.  By doing this, you will get used to what to expect and you will reduce the need to rely on your memory.  Also, try to do things at the same time of day, such as taking your medication or checking your calendar first thing in the morning. Think about think that you can do as a part of a regular routine and make a list.  Then enter them into a daily planner to remind you.  This will help you establish a routine.   2)  Organize your environment. Organize your environment so that it is uncluttered.  Decrease visual stimulation.  Place everyday items such as keys or cell phone in the same place every day (ie.  Basket next to front door) Use post it notes with a brief message to yourself (ie. Turn off light, lock the door) Use labels to indicate where things go (ie. Which cupboards are for food, dishes, etc.) Keep a notepad and pen by the telephone to take messages   3)  Memory Aids A diary or  journal/notebook/daily planner Making a list (shopping list, chore list, to do list that needs to be done) Using an alarm as a reminder (kitchen timer or cell phone alarm) Using cell phone to store information (Notes, Calendar, Reminders) Calendar/White board placed in a prominent position Post-it notes   In order for memory aids to be useful, you need to have good habits.  It's no good remembering to make a note in your journal if you don't remember to look in it.  Try setting aside a certain time of day to look in journal.   4)  Improving mood and managing fatigue. There may be other factors that contribute to memory difficulties.  Factors, such as anxiety, depression and tiredness can affect memory. Regular gentle exercise can help improve your mood and give you more energy. Exercise: there are short videos created by the [employer] on Health specially for older adults: https://bit.ly/2I30q97.  Mediterranean diet: which emphasizes fruits, vegetables, whole grains, legumes, fish, and other seafood; unsaturated fats such as olive oils; and low amounts of red meat, eggs, and sweets. A variation of this, called MIND (Mediterranean-DASH Intervention for Neurodegenerative Delay) incorporates the DASH (Dietary Approaches to Stop Hypertension) diet, which has been shown to lower high blood pressure, a risk factor for Alzheimer's disease. More information at: ExitMarketing.de.  Aerobic exercise that improve heart health is also  good for the mind.  [employer] on Aging have short videos for exercises that you can do at home: BlindWorkshop.com.pt Simple relaxation techniques may help relieve symptoms of anxiety Try to get back to completing activities or hobbies you enjoyed doing in the past. Learn to pace yourself through activities to decrease fatigue. Find out about some local support groups where you can share  experiences with others. Try and achieve 7-8 hours of sleep at night.

## 2023-03-30 NOTE — Progress Notes (Signed)
Chief Complaint  Patient presents with   Follow-up    Pt in room 2, wife in room. Pt here for MCI and CVA. Pt wife said pt is stable,,walks with cane, in speech therapy, and PT, last fall was with physical therapy.  Wife reports memory is stable, long term is really good. Pt wants to know if she will get any better. MMSE:25    HISTORY OF PRESENT ILLNESS:  04/04/23 ALL:  Richard Vaughn is a 72 y.o. male here today for follow up for MCI and CVA. He was last seen by [name] 09/2022. He reported multiple falls. No using cane. MRI brain and cervical spine unremarkable. Since, he reports doing fairly well. He has continued PT/ST. He was released from PT last week and has started HEP. He has had one fall with PT since last visit. He is using his cane. He continues to have left sided weakness. Upper > lower ext. S/P rotator cuff repair which has contributed to left upper ext immobility. Speech may be slightly improved. He continues to have difficulty swallowing. Appetite is decreased. He does well when eating soft foods. He is sleeping well. BP is usually well managed. LDL last checked 11/2021 but he has follow up for CPE with PCP soon. He continues asa and lovastatin. He exercises regularly. He and his wife go to the gym and walk at the park regularly. Memory is stable. No significant changes.   HISTORY (copied from Dr Marlis Edelson previous note)  09/29/2022 : She returns for follow-up after last visit with [name] nurse practitioner 2-1/2 months ago.  He is accompanied by his wife.  Patient states his memory difficulties was unchanged since the last visit.  He has a new complaint of worsening of his balance.  He has fallen twice in the last month and fell today as well.  Patient does not use his cane as he is supposed to.  He has residual left-sided weakness and in his left leg from his stroke.  Denies any tingling numbness or burning in his feet.  Denies any headache or vertigo.  Denies any new strokelike  symptoms.  He denies any neck pain or radicular pain or back pain.  Currently is tolerating well without bruising or bleeding.  His blood pressures under good control and today it is 118/79.  He is tolerating [place] well without muscle aches and pains.  Update 07/13/2022 JM: Patient returns for acute visit accompanied by his wife. He was previously seen by Dr. Pearlean Brownie 9 months ago and relatively stable at that time.  Reports last week over a 3-day period, he works transient episodes of difficulty remembering how to put his shoes on, how to go up steps, and how to get his leg to move.  These lasted less than 1 minute and then remembered how to do these tasks and completely at baseline.  Denies any associated headache, new or worsening stroke/TIA symptoms or seizure activity.  No prior history of migraine headaches or seizures.  Denies any recent medication changes, illness or infection.  Denies any substance use.  Baseline mild cognitive impairment poststroke but believes this has been stable.  MMSE today 22/30.  Compliant on aspirin and lovastatin.  Blood pressure well controlled.  Routinely followed by PCP [name].  No further concerns at this time   REVIEW OF SYSTEMS: Out of a complete 14 system review of symptoms, the patient complains only of the following symptoms, left sided weakness, memory loss, speech difficulty, falls,  decreased appetite, and all other reviewed systems are negative.   ALLERGIES: No Known Allergies   HOME MEDICATIONS: Outpatient Medications Prior to Visit  Medication Sig Dispense Refill   amLODipine (NORVASC) 2.5 MG tablet Take 1 tablet (2.5 mg total) by mouth at bedtime. 90 tablet 2   aspirin EC 81 MG tablet Take 1 tablet (81 mg total) by mouth daily. Swallow whole. 30 tablet 11   DULoxetine (CYMBALTA) 60 MG capsule Take 1 capsule (60 mg total) by mouth daily. 90 capsule 1   esomeprazole (NEXIUM) 40 MG capsule Take 1 capsule (40 mg total) by mouth 2 (two) times daily  before a meal. 180 capsule 1   lovastatin (MEVACOR) 20 MG tablet 1 tab every other day. 45 tablet 1   olmesartan (BENICAR) 20 MG tablet Take 1 tablet (20 mg total) by mouth daily. 90 tablet 3   Oxycodone HCl 10 MG TABS Take 1 tablet (10 mg total) by mouth 2 (two) times daily. Next refill is due on 02/19/2023. 60 tablet 0   traZODone (DESYREL) 50 MG tablet TAKE 1 TABLET AT BEDTIME ASNEEDED FOR SLEEP 90 tablet 2   No facility-administered medications prior to visit.     PAST MEDICAL HISTORY: Past Medical History:  Diagnosis Date   Barrett's esophagus    Chronic back pain    Depression    Gastritis    Mild   Gastroparesis    GERD (gastroesophageal reflux disease)    Headache    History of kidney stones    Hyperglycemia    Hypertension    Nephrolithiasis    Pneumonia    Covid pneumonia   Renal cyst    Sleep apnea    Stricture and stenosis of esophagus    Stroke (HCC) 07/2015   Vision abnormalities      PAST SURGICAL HISTORY: Past Surgical History:  Procedure Laterality Date   CERVICAL DISC SURGERY  2012   COLONOSCOPY     LUMBAR DISC SURGERY  2005, 2010   replaced L4 and L5   SHOULDER ARTHROSCOPY WITH ROTATOR CUFF REPAIR Left 08/14/2020   Procedure: SHOULDER ARTHROSCOPY WITH ROTATOR CUFF REPAIR, SUBACROMIAL DECOMPRESSION;  Surgeon: Jones Broom, MD;  Location: [facility];  Service: Orthopedics;  Laterality: Left;   UPPER GASTROINTESTINAL ENDOSCOPY       FAMILY HISTORY: Family History  Problem Relation Age of Onset   Hypertension Father    Stroke Father    Hypertension Mother    Liver cancer Mother    Hypertension Sister        2 other sisters has also   Stroke Sister    Stroke Sister    Hypertension Brother        2nd brother has also   Colon cancer Neg Hx    Esophageal cancer Neg Hx    Rectal cancer Neg Hx    Stomach cancer Neg Hx    Colon polyps Neg Hx      SOCIAL HISTORY: Social History   Socioeconomic History   Marital status: Married    Spouse  name: [name]   Number of children: 3   Years of education: Not on file   Highest education level: 12th grade  Occupational History   Occupation: retired    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[employer]  Tobacco Use   Smoking status: Former    Packs/day: 0.50    Years: 30.00    Additional pack years: 0.00    Total pack years: 15.00    Types: Cigarettes  Quit date: 04/15/2018    Years since quitting: 4.9   Smokeless tobacco: Never  Vaping Use   Vaping Use: Never used  Substance and Sexual Activity   Alcohol use: No    Alcohol/week: 0.0 standard drinks of alcohol   Drug use: Never    Comment: last use 04/2018   Sexual activity: Yes  Other Topics Concern   Not on file  Social History Narrative   Not on file   Social Determinants of Health   Financial Resource Strain: Low Risk  (04/21/2022)   Overall Financial Resource Strain (CARDIA)    Difficulty of Paying Living Expenses: Not hard at all  Food Insecurity: No Food Insecurity (06/04/2022)   Hunger Vital Sign    Worried About Running Out of Food in the Last Year: Never true    Ran Out of Food in the Last Year: Never true  Transportation Needs: No Transportation Needs (06/04/2022)   PRAPARE - Administrator, Civil Service (Medical): No    Lack of Transportation (Non-Medical): No  Physical Activity: Sufficiently Active (06/04/2022)   Exercise Vital Sign    Days of Exercise per Week: 5 days    Minutes of Exercise per Session: 60 min  Stress: Stress Concern Present (06/04/2022)   [employer] of Occupational Health - Occupational Stress Questionnaire    Feeling of Stress : To some extent  Social Connections: Moderately Integrated (06/04/2022)   Social Connection and Isolation Panel [NHANES]    Frequency of Communication with Friends and Family: Once a week    Frequency of Social Gatherings with Friends and Family: More than three times a week    Attends Religious Services: 1 to 4 times per year    Active Member of [employer] or  Organizations: No    Attends Banker Meetings: Never    Marital Status: Married  Recent Concern: Social Connections - Moderately Isolated (04/21/2022)   Social Connection and Isolation Panel [NHANES]    Frequency of Communication with Friends and Family: More than three times a week    Frequency of Social Gatherings with Friends and Family: More than three times a week    Attends Religious Services: Never    Database administrator or Organizations: No    Attends Banker Meetings: Never    Marital Status: Married  Catering manager Violence: Not At Risk (04/21/2022)   Humiliation, Afraid, Rape, and Kick questionnaire    Fear of Current or Ex-Partner: No    Emotionally Abused: No    Physically Abused: No    Sexually Abused: No     PHYSICAL EXAM  Vitals:   04/04/23 1312  BP: 122/78  Pulse: 75  Weight: 231 lb 8 oz (105 kg)  Height: 6\' 2"  (1.88 m)   Body mass index is 29.72 kg/m.  Generalized: Well developed, in no acute distress  Cardiology: normal rate and rhythm, no murmur auscultated  Respiratory: clear to auscultation bilaterally    Neurological examination  Mentation: Alert oriented to time, place, history taking. Follows all commands. Moderate dysarthria. No apparent aphasia.  Cranial nerve II-XII: Pupils were equal round reactive to light. Extraocular movements were full, visual field were full on confrontational test. Facial sensation and strength were normal. Uvula tongue midline. Head turning and shoulder shrug  were normal and symmetric. Motor: The motor testing reveals 5 over 5 strength of all 4 extremities. Tone increased on left. Reduced abduction and extension of left upper ext.  Sensory: Sensory testing  is intact to soft touch on all 4 extremities. No evidence of extinction is noted.  Coordination: Cerebellar testing reveals good finger-nose-finger and heel-to-shin bilaterally.  Gait and station: Wife assists patient push to standing  position, usually able to do alone with lift chair, hemiplegic gait, unable to tandem    DIAGNOSTIC DATA (LABS, IMAGING, TESTING) - I reviewed patient records, labs, notes, testing and imaging myself where available.  Lab Results  Component Value Date   WBC 4.9 07/13/2022   HGB 13.5 07/13/2022   HCT 40.4 07/13/2022   MCV 92 07/13/2022   PLT 190 07/13/2022      Component Value Date/Time   NA 139 02/02/2023 1511   NA 141 07/13/2022 1320   K 4.1 02/02/2023 1511   CL 105 02/02/2023 1511   CO2 29 02/02/2023 1511   GLUCOSE 72 02/02/2023 1511   GLUCOSE 105 (H) 11/01/2006 1056   BUN 14 02/02/2023 1511   BUN 10 07/13/2022 1320   CREATININE 0.95 02/02/2023 1511   CREATININE 0.99 01/29/2022 1516   CALCIUM 9.1 02/02/2023 1511   PROT 7.0 07/13/2022 1320   ALBUMIN 4.4 07/13/2022 1320   AST 18 07/13/2022 1320   ALT 22 07/13/2022 1320   ALKPHOS 85 07/13/2022 1320   BILITOT 0.4 07/13/2022 1320   GFRNONAA >60 08/07/2020 1045   GFRAA >60 08/07/2020 1045   Lab Results  Component Value Date   CHOL 155 12/09/2021   HDL 43.00 12/09/2021   LDLCALC 94 12/09/2021   TRIG 88.0 12/09/2021   CHOLHDL 4 12/09/2021   Lab Results  Component Value Date   HGBA1C 5.4 12/09/2020   Lab Results  Component Value Date   VITAMINB12 699 07/13/2022   Lab Results  Component Value Date   TSH 1.480 07/13/2022       04/04/2023    1:19 PM 07/13/2022    1:11 PM  MMSE - Mini Mental State Exam  Orientation to time 5 4  Orientation to Place 5 5  Registration 3 3  Attention/ Calculation 0 1  Recall 3 1  Language- name 2 objects 2 2  Language- repeat 1 1  Language- follow 3 step command 3 3  Language- read & follow direction 1 1  Write a sentence 1 1  Copy design 1 0  Total score 25 22         No data to display           ASSESSMENT AND PLAN  72 y.o. year old male  has a past medical history of Barrett's esophagus, Chronic back pain, Depression, Gastritis, Gastroparesis, GERD  (gastroesophageal reflux disease), Headache, History of kidney stones, Hyperglycemia, Hypertension, Nephrolithiasis, Pneumonia, Renal cyst, Sleep apnea, Stricture and stenosis of esophagus, Stroke (HCC) (07/2015), and Vision abnormalities. here with    Hemiparesis affecting left side as late effect of cerebrovascular accident [facility])  MCI (mild cognitive impairment)  CARLA JANICKE is doing fairly well. No significant worsening or concerns of new stroke activity since last visit. We have discussed healthy lifestyle habits to help prevent another stroke in the future. Memory compensation strategies reviewed. He will continue to work with ST and continue HEP from PT.  Continue lovastatin and asa as directed by PCP. He will follow up with PCP as directed. He will return to see Ihor Austin in 6 months, sooner if needed. He verbalizes understanding and agreement with this plan.   No orders of the defined types were placed in this encounter.    No orders  of the defined types were placed in this encounter.   I spent 30 minutes of face-to-face and non-face-to-face time with patient.  This included previsit chart review, lab review, study review, order entry, electronic health record documentation, patient education.   Shawnie Dapper, MSN, FNP-C 04/04/2023, 2:41 PM  [facility] [address], [address] [place], [place] [address] [phone]

## 2023-03-30 NOTE — Therapy (Signed)
OUTPATIENT SPEECH LANGUAGE PATHOLOGY SWALLOW TREATMENT   Patient Name: Richard Vaughn MRN: [id number] DOB:[birth year], 72 y.o., male Today's Date: 03/30/2023  PCP: [place], [name], MD REFERRING PROVIDER: Doree Albee, PA-C   END OF SESSION:  End of Session - 03/30/23 1233     Visit Number 4    Number of Visits 5    Date for SLP Re-Evaluation 04/12/23    Authorization Type Medicare    SLP Start Time 1233    SLP Stop Time  1303    SLP Time Calculation (min) 30 min    Activity Tolerance Patient tolerated treatment well               Past Medical History:  Diagnosis Date   Barrett's esophagus    Chronic back pain    Depression    Gastritis    Mild   Gastroparesis    GERD (gastroesophageal reflux disease)    Headache    History of kidney stones    Hyperglycemia    Hypertension    Nephrolithiasis    Pneumonia    Covid pneumonia   Renal cyst    Sleep apnea    Stricture and stenosis of esophagus    Stroke (HCC) 07/2015   Vision abnormalities    Past Surgical History:  Procedure Laterality Date   CERVICAL DISC SURGERY  2012   COLONOSCOPY     LUMBAR DISC SURGERY  2005, 2010   replaced L4 and L5   SHOULDER ARTHROSCOPY WITH ROTATOR CUFF REPAIR Left 08/14/2020   Procedure: SHOULDER ARTHROSCOPY WITH ROTATOR CUFF REPAIR, SUBACROMIAL DECOMPRESSION;  Surgeon: Jones Broom, MD;  Location: [facility];  Service: Orthopedics;  Laterality: Left;   UPPER GASTROINTESTINAL ENDOSCOPY     Patient Active Problem List   Diagnosis Date Noted   Drug-induced constipation 06/08/2022   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) 12/09/2021   Depression, major, recurrent, mild (HCC)    Sleep disturbance    Urinary retention    Bradycardia    Essential hypertension    Temperature elevated    Left thyroid nodule    Treatment-emergent central sleep apnea 10/25/2018   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) 10/25/2018    Chronic pain syndrome    Chronic bilateral low back pain    Benign essential HTN    Tobacco abuse    Hyperlipidemia    History of CVA with residual deficit    Dysphagia, post-stroke    ICH (intracerebral hemorrhage) (HCC) - R thalmic/PLIC d/t HTN 40/98/1191   Insomnia 07/13/2017   PAD (peripheral artery disease) (HCC) 07/13/2017   Hyperlipidemia with target LDL less than 70 07/13/2017   Stroke-like symptom 09/22/2016   Paresthesia 09/22/2016   CVA (cerebral vascular accident) (HCC) 09/22/2016   Cerebrovascular accident (CVA) due to thrombosis of left carotid artery (HCC) 09/23/2015   Primary snoring 09/23/2015   Hypersomnia with sleep apnea 09/23/2015   Obstructive sleep apnea 09/23/2015   Lacunar infarct, acute (HCC) 08/25/2015   Sleep apnea 08/25/2015   Dysarthria    CVD (cardiovascular disease) 08/02/2015   Acute hyperglycemia 08/02/2015   Systolic hypertension with cerebrovascular disease 12/27/2014   Epistaxis 07/28/2012   NEPHROLITHIASIS 05/23/2009   BARRETTS ESOPHAGUS 02/20/2009   Gastroparesis 02/20/2009   RADICULOPATHY 10/21/2008   GERD 10/08/2008   ESOPHAGITIS 08/15/2008   ESOPHAGEAL STRICTURE 08/15/2008   GASTRITIS, ACUTE 08/15/2008   RENAL CYST 08/14/2008   TOBACCO ABUSE 08/08/2008   HEMATURIA, MICROSCOPIC, HX OF  08/08/2008   PULMONARY NODULE 01/10/2008    ONSET DATE: April 2021   REFERRING DIAG:  R13.12 (ICD-10-CM) - Oropharyngeal dysphagia  Z86.73 (ICD-10-CM) - History of stroke    THERAPY DIAG:  Dysphagia, unspecified type  Rationale for Evaluation and Treatment: Rehabilitation  SUBJECTIVE:   SUBJECTIVE STATEMENT: Pt has completed HEP including hard swallows. He states that the reminders helps him remember to complete the exercises when his EMST device is within reach. He is doing the HEP of hard swallows 5x a week doing 5-6 a day with his goal being 30. He states that a dental implant about 6 months ago which impacts his swallow and makes it  difficult to swallow, however it is not painful. However, the onset of his dysphagia came after his stroke.   PERTINENT HISTORY: 72 year old male with history of short segment Barrett's esophagus noted on EGD in 2017 and stable on most recent EGD in March 2020. He has been on Nexium 40 mg once daily since have seen him (prescription actually says twice daily but he takes it once).  He reports good control of his reflux symptoms on the regimen and is happy with it. Recall the patient has a history of what he says of 3 strokes in the past. 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  He he is on 81 mg/day aspirin. He has been dealing with dysphagia now for some time.  When I asked him where he localizes this it seems more in his throat or upper chest. His wife endorses that he coughs a lot when she eats meals with him, particularly at dinner.  He has been able to swallow his pills down okay but does have difficulty swallowing his foods.  PAIN:  Are you having pain? No  FALLS: Has patient fallen in last 6 months?  Yes, See PT evaluation for details  LIVING ENVIRONMENT: Lives with: lives with their family and lives with their spouse Lives in: House/apartment  PLOF:  Level of assistance: Needed assistance with ADLs Employment: Retired  PATIENT GOALS: "help me swallow"  OBJECTIVE:   TODAY'S TREATMENT:  03/30/23: Education on swallow compensations: full mastication, to take his time, especially with more challenging consistencies, such as meat and bread. Pt prefers dry crackers that crumble, and was recommended to take small bites, and to wash it down with water, or to make the cracker softer before eating. Pt asked for water and took a big gulp, which took him 3x to swallow to get down and nearly resulted in anterior propulsion of swallowed bolus. His swallow improved when he took smaller sips, once prompted, evidenced by clearance with one swallow, no subsequent overt s/sx of aspiration. SLP led pt  through EMST with min verbal cues. He was prompted to take is time with the exercise and to take a breath after 5, with a goal of 10. From a scale 1 to 10 he rated as a 7. During the session pt was able to complete 15 hard swallows during the session with prompts and immediate feedback from SLP. Reviewed taking time to chew, small sips of water, and continued HEP with goal to increase practice.   03/17/23: Pt not completing HEP, awareness evidenced re: limited progress to be expected without completion. SLP assisted pt in generating compensations to aid in improved HEP compliance. Pt ID during watching TV to be time he'd be able to complete. Visual aid and checklist provided to support home practice. Increased pressure for EMST based on perceived effort  level. Led pt through EMST with min verbal cues to aid in completion. Reviewed recommendations to achieve 50 hard swallows during practice with pt verbalizing understanding.   03-04-23: Pt did not bring EMST device with him this date. He did rate his recent perceived effort completing his EMST exercises at home as a 5 on a scale from 1 to 10. Plan to adjust his device next session and reminded him to bring it in next time.  Pt complete EAT-10 PROM with result of 12/40. (0 = no problem, 4 = severe problem). Pt rated 3 for "the pleasure of eating is affected by my swallowing" and 2 for "swallowing solids and pills takes extra effort".  Pt reported that he relies on his wife to remember to do his swallow exercises, although he has been trying to complete them twice a day. He has been doing EMST and two sets of 30 hard swallows , but not the [place]. He had no completed any exercises this date. SLP led pt through completing 20 hard swallows with mod-I and then 5 Mendelsohns. Re-educated pt on importance of completing his exercises to strengthen his swallow. Pt endorsed that he has been getting less choked up when drinking recently.   02-15-23: Educated pt on  strategies and exercises to manage oropharyngeal dysphagia. Initiated EMST and provided education on purpose and benefit. SLP provided model and then pt demonstrated back. SLP educated pt on effortful swallow and Mendelsohn maneuver and provided model. Pt performed exercises given model.  HEP: Complete dysphagia exercises  Plan to complete EAT-10 initial therapy session   PATIENT EDUCATION: Education details: See above Person educated: Patient and Spouse Education method: Explanation, Demonstration, and Handouts Education comprehension: verbalized understanding and needs further education   ASSESSMENT:  CLINICAL IMPRESSION: Pt reported that he has been mostly compliant with completing his exercises, although is not doing the [place] and relies on his wife to remember to do them. He has been completing 30 effortful swallows twice per day. He did not bring his EMST device this date. SLP led pt through completing 20 effortful swallows and 5 Mendelsohns with mod-I. Pt endorsed that recently he has been getting less choked up when eating and drinking. Pt continues to benefit from skilled ST to address oropharyngeal dysphagia and improve swallow safety.   OBJECTIVE IMPAIRMENTS: include dysphagia. These impairments are limiting patient from safety when swallowing. Factors affecting potential to achieve goals and functional outcome are previous level of function. Patient will benefit from skilled SLP services to address above impairments and improve overall function.  REHAB POTENTIAL: Good   GOALS: Goals reviewed with patient? Yes  SHORT TERM GOALS: Target date: 03/15/2023    Pt will teach back dysphagia compensations given rare min-A.  Baseline: Goal status: IN PROGRESS  2.  Pt will accurately demonstrate dysphagia exercises with rare min-A Baseline:  Goal status: IN PROGRESS  3.  Pt will demonstrate swallow precautions with occasional min A  Baseline:  Goal status: IN  PROGRESS   LONG TERM GOALS: Target date: 04/12/2023  Pt will complete follow-up swallow instrumental study to objectively evaluate swallow, if indicated Baseline:  Goal status: IN PROGRESS  2.  Pt will complete progressive dysphagia daily HEP over 2 week period with mod-I Baseline:  Goal status: IN PROGRESS  3.  Pt will report carryover of swallowing strategies over 1 week period  Baseline:  Goal status: IN PROGRESS  4.  Pt will report subjective improvement of swallow function via PROM by d/c Baseline:  Goal status: IN PROGRESS  PLAN:  SLP FREQUENCY: 1x/week or 1x every other week - initiating at 1x every other week. May increase frequency based on pt response to interventions   SLP DURATION: 8 weeks  PLANNED INTERVENTIONS: SLP instruction and feedback, Compensatory strategies, Patient/family education, and dysphagia compensations and exercises     [facility], Student-SLP 03/30/2023, 12:33 PM

## 2023-04-04 ENCOUNTER — Encounter: Payer: Self-pay | Admitting: Family Medicine

## 2023-04-04 ENCOUNTER — Other Ambulatory Visit: Payer: Self-pay | Admitting: Family Medicine

## 2023-04-04 ENCOUNTER — Ambulatory Visit (INDEPENDENT_AMBULATORY_CARE_PROVIDER_SITE_OTHER): Payer: Medicare Other | Admitting: Family Medicine

## 2023-04-04 VITALS — BP 122/78 | HR 75 | Ht 74.0 in | Wt 231.5 lb

## 2023-04-04 DIAGNOSIS — I69354 Hemiplegia and hemiparesis following cerebral infarction affecting left non-dominant side: Secondary | ICD-10-CM | POA: Diagnosis not present

## 2023-04-04 DIAGNOSIS — G894 Chronic pain syndrome: Secondary | ICD-10-CM

## 2023-04-04 DIAGNOSIS — G3184 Mild cognitive impairment, so stated: Secondary | ICD-10-CM

## 2023-04-04 DIAGNOSIS — G8929 Other chronic pain: Secondary | ICD-10-CM

## 2023-04-04 NOTE — Telephone Encounter (Signed)
Prescription Request  04/04/2023  LOV: 02/02/2023  What is the name of the medication or equipment? Oxycodone HCl 10 MG TABS   Have you contacted your pharmacy to request a refill? No   Which pharmacy would you like this sent to?   [id number] - [place], [place] - [address] AT [address] OF [address] [place] [address] Phone: [phone] Fax: [phone]    Patient notified that their request is being sent to the clinical staff for review and that they should receive a response within 2 business days.   Please advise at Mobile [phone] (mobile)

## 2023-04-05 ENCOUNTER — Ambulatory Visit (HOSPITAL_BASED_OUTPATIENT_CLINIC_OR_DEPARTMENT_OTHER): Payer: Medicare Other | Admitting: Physical Therapy

## 2023-04-06 MED ORDER — OXYCODONE HCL 10 MG PO TABS: 10.0000 mg | ORAL_TABLET | Freq: Two times a day (BID) | ORAL | 0 refills | Status: DC

## 2023-04-12 ENCOUNTER — Encounter (HOSPITAL_BASED_OUTPATIENT_CLINIC_OR_DEPARTMENT_OTHER): Payer: Medicare Other | Admitting: Physical Therapy

## 2023-04-14 ENCOUNTER — Ambulatory Visit (INDEPENDENT_AMBULATORY_CARE_PROVIDER_SITE_OTHER): Payer: Medicare Other | Admitting: Podiatry

## 2023-04-14 ENCOUNTER — Encounter: Payer: Self-pay | Admitting: Podiatry

## 2023-04-14 ENCOUNTER — Ambulatory Visit: Payer: Medicare Other | Admitting: Podiatry

## 2023-04-14 DIAGNOSIS — M79675 Pain in left toe(s): Secondary | ICD-10-CM | POA: Diagnosis not present

## 2023-04-14 DIAGNOSIS — M79674 Pain in right toe(s): Secondary | ICD-10-CM | POA: Diagnosis not present

## 2023-04-14 DIAGNOSIS — B351 Tinea unguium: Secondary | ICD-10-CM

## 2023-04-14 DIAGNOSIS — I639 Cerebral infarction, unspecified: Secondary | ICD-10-CM | POA: Diagnosis not present

## 2023-04-14 NOTE — Progress Notes (Signed)
This patient returns to my office for at risk foot care.  This patient requires this care by a professional since this patient will be at risk due to having  CVA, PAD and hyperglycemia. He presents to the office with his wife.  This patient is unable to cut nails himself since the patient cannot reach his nails.These nails are painful walking and wearing shoes.  This patient presents for at risk foot care today.  General Appearance  Alert, conversant and in no acute stress.  Vascular  Dorsalis pedis and posterior tibial  pulses are palpable  bilaterally.  Capillary return is within normal limits  bilaterally. Temperature is within normal limits  bilaterally.  Neurologic  Senn-Weinstein monofilament wire test within normal limits  bilaterally. Muscle power within normal limits bilaterally.  Nails Thick disfigured discolored nails with subungual debris  from hallux to fifth toes bilaterally. No evidence of bacterial infection or drainage bilaterally.  Orthopedic  No limitations of motion  feet .  No crepitus or effusions noted.  Hammer toes  B/l.  Skin  normotropic skin with no porokeratosis noted bilaterally.  No signs of infections or ulcers noted.     Onychomycosis  Pain in right toes  Pain in left toes  Consent was obtained for treatment procedures.   Mechanical debridement of nails 1-5  bilaterally performed with a nail nipper.  Filed with dremel without incident.    Return office visit    10 weeks                 Told patient to return for periodic foot care and evaluation due to potential at risk complications.   Keira Bohlin DPM   

## 2023-04-15 ENCOUNTER — Ambulatory Visit: Payer: Medicare Other | Admitting: Speech Pathology

## 2023-04-15 NOTE — Therapy (Deleted)
OUTPATIENT SPEECH LANGUAGE PATHOLOGY SWALLOW TREATMENT   Patient Name: Richard Vaughn MRN: [id number] DOB:1951-04-25, 72 y.o., male Today's Date: 04/15/2023  PCP: [place], [name], MD REFERRING PROVIDER: Doree Albee, PA-C   END OF SESSION:      Past Medical History:  Diagnosis Date   Barrett's esophagus    Chronic back pain    Depression    Gastritis    Mild   Gastroparesis    GERD (gastroesophageal reflux disease)    Headache    History of kidney stones    Hyperglycemia    Hypertension    Nephrolithiasis    Pneumonia    Covid pneumonia   Renal cyst    Sleep apnea    Stricture and stenosis of esophagus    Stroke (HCC) 07/2015   Vision abnormalities    Past Surgical History:  Procedure Laterality Date   CERVICAL DISC SURGERY  2012   COLONOSCOPY     LUMBAR DISC SURGERY  2005, 2010   replaced L4 and L5   SHOULDER ARTHROSCOPY WITH ROTATOR CUFF REPAIR Left 08/14/2020   Procedure: SHOULDER ARTHROSCOPY WITH ROTATOR CUFF REPAIR, SUBACROMIAL DECOMPRESSION;  Surgeon: Jones Broom, MD;  Location: [facility];  Service: Orthopedics;  Laterality: Left;   UPPER GASTROINTESTINAL ENDOSCOPY     Patient Active Problem List   Diagnosis Date Noted   Drug-induced constipation 06/08/2022   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) 12/09/2021   Depression, major, recurrent, mild (HCC)    Sleep disturbance    Urinary retention    Bradycardia    Essential hypertension    Temperature elevated    Left thyroid nodule    Treatment-emergent central sleep apnea 10/25/2018   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) 10/25/2018   Chronic pain syndrome    Chronic bilateral low back pain    Benign essential HTN    Tobacco abuse    Hyperlipidemia    History of CVA with residual deficit    Dysphagia, post-stroke    ICH (intracerebral hemorrhage) (HCC) - R thalmic/PLIC d/t HTN 29/52/8413   Insomnia 07/13/2017   PAD  (peripheral artery disease) (HCC) 07/13/2017   Hyperlipidemia with target LDL less than 70 07/13/2017   Stroke-like symptom 09/22/2016   Paresthesia 09/22/2016   CVA (cerebral vascular accident) (HCC) 09/22/2016   Cerebrovascular accident (CVA) due to thrombosis of left carotid artery (HCC) 09/23/2015   Primary snoring 09/23/2015   Hypersomnia with sleep apnea 09/23/2015   Obstructive sleep apnea 09/23/2015   Lacunar infarct, acute (HCC) 08/25/2015   Sleep apnea 08/25/2015   Dysarthria    CVD (cardiovascular disease) 08/02/2015   Acute hyperglycemia 08/02/2015   Systolic hypertension with cerebrovascular disease 12/27/2014   Epistaxis 07/28/2012   NEPHROLITHIASIS 05/23/2009   BARRETTS ESOPHAGUS 02/20/2009   Gastroparesis 02/20/2009   RADICULOPATHY 10/21/2008   GERD 10/08/2008   ESOPHAGITIS 08/15/2008   ESOPHAGEAL STRICTURE 08/15/2008   GASTRITIS, ACUTE 08/15/2008   RENAL CYST 08/14/2008   TOBACCO ABUSE 08/08/2008   HEMATURIA, MICROSCOPIC, HX OF 08/08/2008   PULMONARY NODULE 01/10/2008    ONSET DATE: April 2021   REFERRING DIAG:  R13.12 (ICD-10-CM) - Oropharyngeal dysphagia  Z86.73 (ICD-10-CM) - History of stroke    THERAPY DIAG:  No diagnosis found.  Rationale for Evaluation and Treatment: Rehabilitation  SUBJECTIVE:   SUBJECTIVE STATEMENT: ***  PERTINENT HISTORY: 72 year old male with history of short segment Barrett's esophagus noted on EGD in 2017 and stable on most recent EGD in  March 2020. He has been on Nexium 40 mg once daily since have seen him (prescription actually says twice daily but he takes it once).  He reports good control of his reflux symptoms on the regimen and is happy with it. Recall the patient has a history of what he says of 3 strokes in the past. 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  He he is on 81 mg/day aspirin. He has been dealing with dysphagia now for some time.  When I asked him where he localizes this it seems more in his throat or  upper chest. His wife endorses that he coughs a lot when she eats meals with him, particularly at dinner.  He has been able to swallow his pills down okay but does have difficulty swallowing his foods.  PAIN:  Are you having pain? No  FALLS: Has patient fallen in last 6 months?  Yes, See PT evaluation for details  LIVING ENVIRONMENT: Lives with: lives with their family and lives with their spouse Lives in: House/apartment  PLOF:  Level of assistance: Needed assistance with ADLs Employment: Retired  PATIENT GOALS: "help me swallow"  OBJECTIVE:   TODAY'S TREATMENT:  04/15/23: ***  03/30/23: Education on swallow compensations: full mastication, to take his time, especially with more challenging consistencies, such as meat and bread. Pt prefers dry crackers that crumble, and was recommended to take small bites, and to wash it down with water, or to make the cracker softer before eating. Pt asked for water and took a big gulp, which took him 3x to swallow to get down and nearly resulted in anterior propulsion of swallowed bolus. His swallow improved when he took smaller sips, once prompted, evidenced by clearance with one swallow, no subsequent overt s/sx of aspiration. SLP led pt through EMST with min verbal cues. He was prompted to take is time with the exercise and to take a breath after 5, with a goal of 10. From a scale 1 to 10 he rated as a 7. During the session pt was able to complete 15 hard swallows during the session with prompts and immediate feedback from SLP. Reviewed taking time to chew, small sips of water, and continued HEP with goal to increase practice.   03/17/23: Pt not completing HEP, awareness evidenced re: limited progress to be expected without completion. SLP assisted pt in generating compensations to aid in improved HEP compliance. Pt ID during watching TV to be time he'd be able to complete. Visual aid and checklist provided to support home practice. Increased pressure for  EMST based on perceived effort level. Led pt through EMST with min verbal cues to aid in completion. Reviewed recommendations to achieve 50 hard swallows during practice with pt verbalizing understanding.   03-04-23: Pt did not bring EMST device with him this date. He did rate his recent perceived effort completing his EMST exercises at home as a 5 on a scale from 1 to 10. Plan to adjust his device next session and reminded him to bring it in next time.  Pt complete EAT-10 PROM with result of 12/40. (0 = no problem, 4 = severe problem). Pt rated 3 for "the pleasure of eating is affected by my swallowing" and 2 for "swallowing solids and pills takes extra effort".  Pt reported that he relies on his wife to remember to do his swallow exercises, although he has been trying to complete them twice a day. He has been doing EMST and two sets of 30  hard swallows , but not the Mendelsohn. He had no completed any exercises this date. SLP led pt through completing 20 hard swallows with mod-I and then 5 Mendelsohns. Re-educated pt on importance of completing his exercises to strengthen his swallow. Pt endorsed that he has been getting less choked up when drinking recently.   02-15-23: Educated pt on strategies and exercises to manage oropharyngeal dysphagia. Initiated EMST and provided education on purpose and benefit. SLP provided model and then pt demonstrated back. SLP educated pt on effortful swallow and Mendelsohn maneuver and provided model. Pt performed exercises given model.  HEP: Complete dysphagia exercises  Plan to complete EAT-10 initial therapy session   PATIENT EDUCATION: Education details: See above Person educated: Patient and Spouse Education method: Explanation, Demonstration, and Handouts Education comprehension: verbalized understanding and needs further education   ASSESSMENT:  CLINICAL IMPRESSION: Pt reported that he has been mostly compliant with completing his exercises, although is not  doing the Virgie and relies on his wife to remember to do them. He has been completing 30 effortful swallows twice per day. He did not bring his EMST device this date. SLP led pt through completing 20 effortful swallows and 5 Mendelsohns with mod-I. Pt endorsed that recently he has been getting less choked up when eating and drinking. Pt continues to benefit from skilled ST to address oropharyngeal dysphagia and improve swallow safety.   OBJECTIVE IMPAIRMENTS: include dysphagia. These impairments are limiting patient from safety when swallowing. Factors affecting potential to achieve goals and functional outcome are previous level of function. Patient will benefit from skilled SLP services to address above impairments and improve overall function.  REHAB POTENTIAL: Good   GOALS: Goals reviewed with patient? Yes  SHORT TERM GOALS: Target date: 03/15/2023    Pt will teach back dysphagia compensations given rare min-A.  Baseline: Goal status: IN PROGRESS  2.  Pt will accurately demonstrate dysphagia exercises with rare min-A Baseline:  Goal status: IN PROGRESS  3.  Pt will demonstrate swallow precautions with occasional min A  Baseline:  Goal status: IN PROGRESS   LONG TERM GOALS: Target date: 04/12/2023  Pt will complete follow-up swallow instrumental study to objectively evaluate swallow, if indicated Baseline:  Goal status: IN PROGRESS  2.  Pt will complete progressive dysphagia daily HEP over 2 week period with mod-I Baseline:  Goal status: IN PROGRESS  3.  Pt will report carryover of swallowing strategies over 1 week period  Baseline:  Goal status: IN PROGRESS  4.  Pt will report subjective improvement of swallow function via PROM by d/c Baseline:  Goal status: IN PROGRESS  PLAN:  SLP FREQUENCY: 1x/week or 1x every other week - initiating at 1x every other week. May increase frequency based on pt response to interventions   SLP DURATION: 8 weeks  PLANNED  INTERVENTIONS: SLP instruction and feedback, Compensatory strategies, Patient/family education, and dysphagia compensations and exercises     [place], Student-SLP 04/15/2023, 10:00 AM

## 2023-04-25 ENCOUNTER — Ambulatory Visit (INDEPENDENT_AMBULATORY_CARE_PROVIDER_SITE_OTHER): Payer: Medicare Other

## 2023-04-25 VITALS — Ht 74.0 in | Wt 231.0 lb

## 2023-04-25 DIAGNOSIS — Z0000 Encounter for general adult medical examination without abnormal findings: Secondary | ICD-10-CM

## 2023-04-25 NOTE — Patient Instructions (Addendum)
Richard Vaughn , Thank you for taking time to come for your Medicare Wellness Visit. I appreciate your ongoing commitment to your health goals. Please review the following plan we discussed and let me know if I can assist you in the future.   These are the goals we discussed:  Goals       Increase physical activity (pt-stated)      Patient stated (pt-stated)      I want to get back to normal.      Quit smoking / using tobacco        This is a list of the screening recommended for you and due dates:  Health Maintenance  Topic Date Due   Zoster (Shingles) Vaccine (1 of 2) 07/26/2023*   Pneumonia Vaccine (2 of 2 - PCV) 04/24/2024*   Flu Shot  06/16/2023   Medicare Annual Wellness Visit  04/24/2024   DTaP/Tdap/Td vaccine (2 - Td or Tdap) 12/27/2024   Colon Cancer Screening  02/18/2027   Hepatitis C Screening  Completed   HPV Vaccine  Aged Out   COVID-19 Vaccine  Discontinued  *Topic was postponed. The date shown is not the original due date.   Opioid Pain Medicine Management Opioids are powerful medicines that are used to treat moderate to severe pain. When used for short periods of time, they can help you to: Sleep better. Do better in physical or occupational therapy. Feel better in the first few days after an injury. Recover from surgery. Opioids should be taken with the supervision of a trained health care provider. They should be taken for the shortest period of time possible. This is because opioids can be addictive, and the longer you take opioids, the greater your risk of addiction. This addiction can also be called opioid use disorder. What are the risks? Using opioid pain medicines for longer than 3 days increases your risk of side effects. Side effects include: Constipation. Nausea and vomiting. Breathing difficulties (respiratory depression). Drowsiness. Confusion. Opioid use disorder. Itching. Taking opioid pain medicine for a long period of time can affect your ability  to do daily tasks. It also puts you at risk for: Motor vehicle crashes. Depression. Suicide. Heart attack. Overdose, which can be life-threatening. What is a pain treatment plan? A pain treatment plan is an agreement between you and your health care provider. Pain is unique to each person, and treatments vary depending on your condition. To manage your pain, you and your health care provider need to work together. To help you do this: Discuss the goals of your treatment, including how much pain you might expect to have and how you will manage the pain. Review the risks and benefits of taking opioid medicines. Remember that a good treatment plan uses more than one approach and minimizes the chance of side effects. Be honest about the amount of medicines you take and about any drug or alcohol use. Get pain medicine prescriptions from only one health care provider. Pain can be managed with many types of alternative treatments. Ask your health care provider to refer you to one or more specialists who can help you manage pain through: Physical or occupational therapy. Counseling (cognitive behavioral therapy). Good nutrition. Biofeedback. Massage. Meditation. Non-opioid medicine. Following a gentle exercise program. How to use opioid pain medicine Taking medicine Take your pain medicine exactly as told by your health care provider. Take it only when you need it. If your pain gets less severe, you may take less than your prescribed dose  if your health care provider approves. If you are not having pain, do nottake pain medicine unless your health care provider tells you to take it. If your pain is severe, do nottry to treat it yourself by taking more pills than instructed on your prescription. Contact your health care provider for help. Write down the times when you take your pain medicine. It is easy to become confused while on pain medicine. Writing the time can help you avoid overdose. Take  other over-the-counter or prescription medicines only as told by your health care provider. Keeping yourself and others safe  While you are taking opioid pain medicine: Do not drive, use machinery, or power tools. Do not sign legal documents. Do not drink alcohol. Do not take sleeping pills. Do not supervise children by yourself. Do not do activities that require climbing or being in high places. Do not go to a lake, river, ocean, spa, or swimming pool. Do not share your pain medicine with anyone. Keep pain medicine in a locked cabinet or in a secure area where pets and children cannot reach it. Stopping your use of opioids If you have been taking opioid medicine for more than a few weeks, you may need to slowly decrease (taper) how much you take until you stop completely. Tapering your use of opioids can decrease your risk of symptoms of withdrawal, such as: Pain and cramping in the abdomen. Nausea. Sweating. Sleepiness. Restlessness. Uncontrollable shaking (tremors). Cravings for the medicine. Do not attempt to taper your use of opioids on your own. Talk with your health care provider about how to do this. Your health care provider may prescribe a step-down schedule based on how much medicine you are taking and how long you have been taking it. Getting rid of leftover pills Do not save any leftover pills. Get rid of leftover pills safely by: Taking the medicine to a prescription take-back program. This is usually offered by the county or law enforcement. Bringing them to a pharmacy that has a drug disposal container. Flushing them down the toilet. Check the label or package insert of your medicine to see whether this is safe to do. Throwing them out in the trash. Check the label or package insert of your medicine to see whether this is safe to do. If it is safe to throw it out, remove the medicine from the original container, put it into a sealable bag or container, and mix it with  used coffee grounds, food scraps, dirt, or cat litter before putting it in the trash. Follow these instructions at home: Activity Do exercises as told by your health care provider. Avoid activities that make your pain worse. Return to your normal activities as told by your health care provider. Ask your health care provider what activities are safe for you. General instructions You may need to take these actions to prevent or treat constipation: Drink enough fluid to keep your urine pale yellow. Take over-the-counter or prescription medicines. Eat foods that are high in fiber, such as beans, whole grains, and fresh fruits and vegetables. Limit foods that are high in fat and processed sugars, such as fried or sweet foods. Keep all follow-up visits. This is important. Where to find support If you have been taking opioids for a long time, you may benefit from receiving support for quitting from a local support group or counselor. Ask your health care provider for a referral to these resources in your area. Where to find more information Centers for Disease  Control and Prevention (CDC): FootballExhibition.com.br [place] Food and Drug Administration (FDA): PumpkinSearch.com.ee Get help right away if: You may have taken too much of an opioid (overdosed). Common symptoms of an overdose: Your breathing is slower or more shallow than normal. You have a very slow heartbeat (pulse). You have slurred speech. You have nausea and vomiting. Your pupils become very small. You have other potential symptoms: You are very confused. You faint or feel like you will faint. You have cold, clammy skin. You have blue lips or fingernails. You have thoughts of harming yourself or harming others. These symptoms may represent a serious problem that is an emergency. Do not wait to see if the symptoms will go away. Get medical help right away. Call your local emergency services (911 in the [place] Do not drive yourself to the hospital.  If  you ever feel like you may hurt yourself or others, or have thoughts about taking your own life, get help right away. Go to your nearest emergency department or: Call your local emergency services (911 in the [place] Call the [facility] ((949)791-0268 in the [place] Call a suicide crisis helpline, such as the National Suicide Prevention Lifeline at 604-376-9687 or 988 in the [place] This is open 24 hours a day in the [place] Text the Crisis Text Line at 240-543-3850 (in the [place] Summary Opioid medicines can help you manage moderate to severe pain for a short period of time. A pain treatment plan is an agreement between you and your health care provider. Discuss the goals of your treatment, including how much pain you might expect to have and how you will manage the pain. If you think that you or someone else may have taken too much of an opioid, get medical help right away. This information is not intended to replace advice given to you by your health care provider. Make sure you discuss any questions you have with your health care provider. Document Revised: 05/27/2021 Document Reviewed: 02/11/2021 Elsevier Patient Education  2024 [employer]  Advanced directives: Advance directive discussed with you today. Even though you declined this today, please call our office should you change your mind, and we can give you the proper paperwork for you to fill out.   Conditions/risks identified: None  Next appointment: Follow up in one year for your annual wellness visit.   Preventive Care 52 Years and Older, Male  Preventive care refers to lifestyle choices and visits with your health care provider that can promote health and wellness. What does preventive care include? A yearly physical exam. This is also called an annual well check. Dental exams once or twice a year. Routine eye exams. Ask your health care provider how often you should have your eyes checked. Personal lifestyle  choices, including: Daily care of your teeth and gums. Regular physical activity. Eating a healthy diet. Avoiding tobacco and drug use. Limiting alcohol use. Practicing safe sex. Taking low doses of aspirin every day. Taking vitamin and mineral supplements as recommended by your health care provider. What happens during an annual well check? The services and screenings done by your health care provider during your annual well check will depend on your age, overall health, lifestyle risk factors, and family history of disease. Counseling  Your health care provider may ask you questions about your: Alcohol use. Tobacco use. Drug use. Emotional well-being. Home and relationship well-being. Sexual activity. Eating habits. History of falls. Memory and ability to understand (cognition). Work and work Astronomer. Screening  You may have the following tests or measurements: Height, weight, and BMI. Blood pressure. Lipid and cholesterol levels. These may be checked every 5 years, or more frequently if you are over 62 years old. Skin check. Lung cancer screening. You may have this screening every year starting at age 46 if you have a 30-pack-year history of smoking and currently smoke or have quit within the past 15 years. Fecal occult blood test (FOBT) of the stool. You may have this test every year starting at age 10. Flexible sigmoidoscopy or colonoscopy. You may have a sigmoidoscopy every 5 years or a colonoscopy every 10 years starting at age 53. Prostate cancer screening. Recommendations will vary depending on your family history and other risks. Hepatitis C blood test. Hepatitis B blood test. Sexually transmitted disease (STD) testing. Diabetes screening. This is done by checking your blood sugar (glucose) after you have not eaten for a while (fasting). You may have this done every 1-3 years. Abdominal aortic aneurysm (AAA) screening. You may need this if you are a current or  former smoker. Osteoporosis. You may be screened starting at age 56 if you are at high risk. Talk with your health care provider about your test results, treatment options, and if necessary, the need for more tests. Vaccines  Your health care provider may recommend certain vaccines, such as: Influenza vaccine. This is recommended every year. Tetanus, diphtheria, and acellular pertussis (Tdap, Td) vaccine. You may need a Td booster every 10 years. Zoster vaccine. You may need this after age 42. Pneumococcal 13-valent conjugate (PCV13) vaccine. One dose is recommended after age 4. Pneumococcal polysaccharide (PPSV23) vaccine. One dose is recommended after age 32. Talk to your health care provider about which screenings and vaccines you need and how often you need them. This information is not intended to replace advice given to you by your health care provider. Make sure you discuss any questions you have with your health care provider. Document Released: 11/28/2015 Document Revised: 07/21/2016 Document Reviewed: 09/02/2015 Elsevier Interactive Patient Education  2017 [employer].  Fall Prevention in the Home Falls can cause injuries. They can happen to people of all ages. There are many things you can do to make your home safe and to help prevent falls. What can I do on the outside of my home? Regularly fix the edges of walkways and driveways and fix any cracks. Remove anything that might make you trip as you walk through a door, such as a raised step or threshold. Trim any bushes or trees on the path to your home. Use bright outdoor lighting. Clear any walking paths of anything that might make someone trip, such as rocks or tools. Regularly check to see if handrails are loose or broken. Make sure that both sides of any steps have handrails. Any raised decks and porches should have guardrails on the edges. Have any leaves, snow, or ice cleared regularly. Use sand or salt on walking paths  during winter. Clean up any spills in your garage right away. This includes oil or grease spills. What can I do in the bathroom? Use night lights. Install grab bars by the toilet and in the tub and shower. Do not use towel bars as grab bars. Use non-skid mats or decals in the tub or shower. If you need to sit down in the shower, use a plastic, non-slip stool. Keep the floor dry. Clean up any water that spills on the floor as soon as it happens. Remove soap buildup in  the tub or shower regularly. Attach bath mats securely with double-sided non-slip rug tape. Do not have throw rugs and other things on the floor that can make you trip. What can I do in the bedroom? Use night lights. Make sure that you have a light by your bed that is easy to reach. Do not use any sheets or blankets that are too big for your bed. They should not hang down onto the floor. Have a firm chair that has side arms. You can use this for support while you get dressed. Do not have throw rugs and other things on the floor that can make you trip. What can I do in the kitchen? Clean up any spills right away. Avoid walking on wet floors. Keep items that you use a lot in easy-to-reach places. If you need to reach something above you, use a strong step stool that has a grab bar. Keep electrical cords out of the way. Do not use floor polish or wax that makes floors slippery. If you must use wax, use non-skid floor wax. Do not have throw rugs and other things on the floor that can make you trip. What can I do with my stairs? Do not leave any items on the stairs. Make sure that there are handrails on both sides of the stairs and use them. Fix handrails that are broken or loose. Make sure that handrails are as long as the stairways. Check any carpeting to make sure that it is firmly attached to the stairs. Fix any carpet that is loose or worn. Avoid having throw rugs at the top or bottom of the stairs. If you do have throw  rugs, attach them to the floor with carpet tape. Make sure that you have a light switch at the top of the stairs and the bottom of the stairs. If you do not have them, ask someone to add them for you. What else can I do to help prevent falls? Wear shoes that: Do not have high heels. Have rubber bottoms. Are comfortable and fit you well. Are closed at the toe. Do not wear sandals. If you use a stepladder: Make sure that it is fully opened. Do not climb a closed stepladder. Make sure that both sides of the stepladder are locked into place. Ask someone to hold it for you, if possible. Clearly mark and make sure that you can see: Any grab bars or handrails. First and last steps. Where the edge of each step is. Use tools that help you move around (mobility aids) if they are needed. These include: Canes. Walkers. Scooters. Crutches. Turn on the lights when you go into a dark area. Replace any light bulbs as soon as they burn out. Set up your furniture so you have a clear path. Avoid moving your furniture around. If any of your floors are uneven, fix them. If there are any pets around you, be aware of where they are. Review your medicines with your doctor. Some medicines can make you feel dizzy. This can increase your chance of falling. Ask your doctor what other things that you can do to help prevent falls. This information is not intended to replace advice given to you by your health care provider. Make sure you discuss any questions you have with your health care provider. Document Released: 08/28/2009 Document Revised: 04/08/2016 Document Reviewed: 12/06/2014 Elsevier Interactive Patient Education  2017 [employer].

## 2023-04-25 NOTE — Progress Notes (Signed)
Subjective:   Richard Vaughn is a 72 y.o. male who presents for Medicare Annual/Subsequent preventive examination.  Review of Systems    Nutrition Risk Assessment:  Virtual Visit via Telephone Note  I connected with  Richard Vaughn on 04/25/23 at 11:00 AM EDT by telephone and verified that I am speaking with the correct person using two identifiers.  Location: Patient: Home Provider: Office Persons participating in the virtual visit: patient/Nurse Health Advisor   I discussed the limitations, risks, security and privacy concerns of performing an evaluation and management service by telephone and the availability of in person appointments. The patient expressed understanding and agreed to proceed.  Interactive audio and video telecommunications were attempted between this nurse and patient, however failed, due to patient having technical difficulties OR patient did not have access to video capability.  We continued and completed visit with audio only.  Some vital signs may be absent or patient reported.   [name], LPN     Objective:    Today's Vitals   04/25/23 1110  Weight: 231 lb (104.8 kg)  Height: 6\' 2"  (1.88 m)   Body mass index is 29.66 kg/m.     04/25/2023   11:25 AM 02/15/2023    2:23 PM 11/12/2022    2:38 PM 04/21/2022   10:27 AM 11/03/2021    5:47 PM 04/24/2021    1:17 PM 08/07/2020   10:23 AM  Advanced Directives  Does Patient Have a Medical Advance Directive? No No No No No No No  Would patient like information on creating a medical advance directive? No - Patient declined  No - Patient declined No - Patient declined No - Patient declined Yes (MAU/Ambulatory/Procedural Areas - Information given) No - Patient declined    Current Medications (verified) Outpatient Encounter Medications as of 04/25/2023  Medication Sig   amLODipine (NORVASC) 2.5 MG tablet Take 1 tablet (2.5 mg total) by mouth at bedtime.   aspirin EC 81 MG tablet Take 1 tablet (81 mg  total) by mouth daily. Swallow whole.   DULoxetine (CYMBALTA) 60 MG capsule Take 1 capsule (60 mg total) by mouth daily.   esomeprazole (NEXIUM) 40 MG capsule Take 1 capsule (40 mg total) by mouth 2 (two) times daily before a meal.   lovastatin (MEVACOR) 20 MG tablet 1 tab every other day.   olmesartan (BENICAR) 20 MG tablet Take 1 tablet (20 mg total) by mouth daily.   Oxycodone HCl 10 MG TABS Take 1 tablet (10 mg total) by mouth 2 (two) times daily.   traZODone (DESYREL) 50 MG tablet TAKE 1 TABLET AT BEDTIME ASNEEDED FOR SLEEP   No facility-administered encounter medications on file as of 04/25/2023.    Allergies (verified) Patient has no known allergies.   History: Past Medical History:  Diagnosis Date   Barrett's esophagus    Chronic back pain    Depression    Gastritis    Mild   Gastroparesis    GERD (gastroesophageal reflux disease)    Headache    History of kidney stones    Hyperglycemia    Hypertension    Nephrolithiasis    Pneumonia    Covid pneumonia   Renal cyst    Sleep apnea    Stricture and stenosis of esophagus    Stroke (HCC) 07/2015   Vision abnormalities    Past Surgical History:  Procedure Laterality Date   CERVICAL DISC SURGERY  2012   COLONOSCOPY     LUMBAR DISC  SURGERY  2005, 2010   replaced L4 and L5   SHOULDER ARTHROSCOPY WITH ROTATOR CUFF REPAIR Left 08/14/2020   Procedure: SHOULDER ARTHROSCOPY WITH ROTATOR CUFF REPAIR, SUBACROMIAL DECOMPRESSION;  Surgeon: Jones Broom, MD;  Location: [facility];  Service: Orthopedics;  Laterality: Left;   UPPER GASTROINTESTINAL ENDOSCOPY     Family History  Problem Relation Age of Onset   Hypertension Father    Stroke Father    Hypertension Mother    Liver cancer Mother    Hypertension Sister        2 other sisters has also   Stroke Sister    Stroke Sister    Hypertension Brother        2nd brother has also   Colon cancer Neg Hx    Esophageal cancer Neg Hx    Rectal cancer Neg Hx    Stomach cancer  Neg Hx    Colon polyps Neg Hx    Social History   Socioeconomic History   Marital status: Married    Spouse name: Richard Vaughn   Number of children: 3   Years of education: Not on file   Highest education level: 12th grade  Occupational History   Occupation: retired    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[employer]  Tobacco Use   Smoking status: Former    Packs/day: 0.50    Years: 30.00    Additional pack years: 0.00    Total pack years: 15.00    Types: Cigarettes    Quit date: 04/15/2018    Years since quitting: 5.0   Smokeless tobacco: Never  Vaping Use   Vaping Use: Never used  Substance and Sexual Activity   Alcohol use: No    Alcohol/week: 0.0 standard drinks of alcohol   Drug use: Never    Comment: last use 04/2018   Sexual activity: Yes  Other Topics Concern   Not on file  Social History Narrative   Not on file   Social Determinants of Health   Financial Resource Strain: Low Risk  (04/25/2023)   Overall Financial Resource Strain (CARDIA)    Difficulty of Paying Living Expenses: Not hard at all  Food Insecurity: No Food Insecurity (04/25/2023)   Hunger Vital Sign    Worried About Running Out of Food in the Last Year: Never true    Ran Out of Food in the Last Year: Never true  Transportation Needs: No Transportation Needs (04/25/2023)   PRAPARE - Administrator, Civil Service (Medical): No    Lack of Transportation (Non-Medical): No  Physical Activity: Sufficiently Active (04/25/2023)   Exercise Vital Sign    Days of Exercise per Week: 5 days    Minutes of Exercise per Session: 60 min  Stress: No Stress Concern Present (04/25/2023)   [employer] of Occupational Health - Occupational Stress Questionnaire    Feeling of Stress : Not at all  Social Connections: Moderately Isolated (04/25/2023)   Social Connection and Isolation Panel [NHANES]    Frequency of Communication with Friends and Family: More than three times a week    Frequency of Social Gatherings with Friends  and Family: More than three times a week    Attends Religious Services: Never    Database administrator or Organizations: No    Attends Banker Meetings: Never    Marital Status: Married    Tobacco Counseling Counseling given: Not Answered   Clinical Intake:  Pre-visit preparation completed: Yes  Pain : No/denies pain  BMI - recorded: 29.66 Nutritional Status: BMI 25 -29 Overweight Nutritional Risks: None Diabetes: No  How often do you need to have someone help you when you read instructions, pamphlets, or other written materials from your doctor or pharmacy?: 1 - Never  Diabetic?  No  Interpreter Needed?: No  Information entered by :: Theresa Mulligan LPN   Activities of Daily Living    04/25/2023   11:21 AM 04/25/2023    8:48 AM  In your present state of health, do you have any difficulty performing the following activities:  Hearing? 0 0  Vision? 0 0  Difficulty concentrating or making decisions? 1 1  Comment Wife assist   Walking or climbing stairs? 1 1  Comment Uses a cane   Dressing or bathing? 0 0  Doing errands, shopping? 1 1  Comment Wife Advice worker and eating ? N N  Using the Toilet? N N  In the past six months, have you accidently leaked urine? Y Y  Comment Followed by Urologist   Do you have problems with loss of bowel control? N N  Managing your Medications? N N  Managing your Finances? N N  Housekeeping or managing your Housekeeping? Dorris Carnes Y    Patient Care Team: [place], [name], MD as PCP - General (Family Medicine)  Indicate any recent Medical Services you may have received from other than [facility] providers in the past year (date may be approximate).     Assessment:   This is a routine wellness examination for Lenord.  Hearing/Vision screen Hearing Screening - Comments:: Denies hearing difficulties   Vision Screening - Comments:: Wears rx glasses - up to date with routine eye exams with  [facility]  Dietary  issues and exercise activities discussed: Current Exercise Habits: Home exercise routine, Type of exercise: walking, Time (Minutes): 60, Frequency (Times/Week): 5, Weekly Exercise (Minutes/Week): 300, Intensity: Moderate, Exercise limited by: None identified   Goals Addressed               This Visit's Progress     Increase physical activity (pt-stated)         Depression Screen    04/25/2023   11:20 AM 02/02/2023    2:34 PM 11/03/2022    3:19 PM 06/08/2022    3:15 PM 04/21/2022   10:22 AM 01/29/2022    2:35 PM 12/09/2021    3:20 PM  PHQ 2/9 Scores  PHQ - 2 Score 0 0 1 3 0 2 3  PHQ- 9 Score  0 5 4  3 10     Fall Risk    04/25/2023   11:24 AM 04/25/2023    8:48 AM 02/02/2023    2:34 PM 11/03/2022    3:19 PM 06/08/2022    3:15 PM  Fall Risk   Falls in the past year? 1 1 1 1 1   Number falls in past yr: 0 1 1 1  0  Injury with Fall? 0 0 0 0 0  Risk for fall due to : No Fall Risks  History of fall(s);Impaired balance/gait History of fall(s);Impaired balance/gait History of fall(s);Impaired balance/gait  Follow up Falls prevention discussed  Education provided;Falls evaluation completed Falls evaluation completed Falls evaluation completed;Education provided    FALL RISK PREVENTION PERTAINING TO THE HOME:  Any stairs in or around the home? Yes  If so, are there any without handrails? No  Home free of loose throw rugs in walkways, pet beds, electrical cords, etc? Yes  Adequate lighting in  your home to reduce risk of falls? Yes   ASSISTIVE DEVICES UTILIZED TO PREVENT FALLS:  Life alert? No  Use of a cane, walker or w/c? Yes  Grab bars in the bathroom? No  Shower chair or bench in shower? Yes Elevated toilet seat or a handicapped toilet? Yes  TIMED UP AND GO:  Was the test performed? No . Audio Visit   Cognitive Function:    04/04/2023    1:19 PM 07/13/2022    1:11 PM  MMSE - Mini Mental State Exam  Orientation to time 5 4  Orientation to Place 5 5  Registration 3 3   Attention/ Calculation 0 1  Recall 3 1  Language- name 2 objects 2 2  Language- repeat 1 1  Language- follow 3 step command 3 3  Language- read & follow direction 1 1  Write a sentence 1 1  Copy design 1 0  Total score 25 22        04/25/2023   11:25 AM 04/21/2022   10:27 AM  6CIT Screen  What Year? 0 points 0 points  What month? 0 points 0 points  What time? 0 points 0 points  Count back from 20 0 points 0 points  Months in reverse 0 points 0 points  Repeat phrase 0 points 0 points  Total Score 0 points 0 points    Immunizations Immunization History  Administered Date(s) Administered   Fluad Quad(high Dose 65+) 10/05/2022   PFIZER(Purple Top)SARS-COV-2 Vaccination 01/05/2020, 01/29/2020   Pfizer Covid-19 Vaccine Bivalent Booster 15yrs & up 10/12/2021   Pneumococcal Polysaccharide-23 12/09/2020, 12/09/2021   Tdap 12/27/2014    TDAP status: Up to date  Flu Vaccine status: Up to date  Pneumonia Vaccine Completed 12/09/21    Qualifies for Shingles Vaccine? Yes   Zostavax completed No   Shingrix Completed?: No.    Education has been provided regarding the importance of this vaccine. Patient has been advised to call insurance company to determine out of pocket expense if they have not yet received this vaccine. Advised may also receive vaccine at local pharmacy or Health Dept. Verbalized acceptance and understanding.  Screening Tests Health Maintenance  Topic Date Due   Zoster Vaccines- Shingrix (1 of 2) 07/26/2023 (Originally 10/30/1970)   Pneumonia Vaccine 47+ Years old (2 of 2 - PCV) 04/24/2024 (Originally 12/09/2022)   INFLUENZA VACCINE  06/16/2023   Medicare Annual Wellness (AWV)  04/24/2024   DTaP/Tdap/Td (2 - Td or Tdap) 12/27/2024   Colonoscopy  02/18/2027   Hepatitis C Screening  Completed   HPV VACCINES  Aged Out   COVID-19 Vaccine  Discontinued    Health Maintenance  There are no preventive care reminders to display for this patient.   Colorectal  cancer screening: Type of screening: Colonoscopy. Completed 02/17/22. Repeat every   years  Lung Cancer Screening: (Low Dose CT Chest recommended if Age 33-80 years, 30 pack-year currently smoking OR have quit w/in 15years.) does not qualify.    Additional Screening:  Hepatitis C Screening: does qualify; Completed 08/15/17  Vision Screening: Recommended annual ophthalmology exams for early detection of glaucoma and other disorders of the eye. Is the patient up to date with their annual eye exam?  Yes  Who is the provider or what is the name of the office in which the patient attends annual eye exams? [facility] If pt is not established with a provider, would they like to be referred to a provider to establish care? No .  Dental Screening: Recommended annual dental exams for proper oral hygiene  Community Resource Referral / Chronic Care Management:  CRR required this visit?  No   CCM required this visit?  No      Plan:     I have personally reviewed and noted the following in the patient's chart:   Medical and social history Use of alcohol, tobacco or illicit drugs  Current medications and supplements including opioid prescriptions. Patient is currently taking opioid prescriptions. Information provided to patient regarding non-opioid alternatives. Patient advised to discuss non-opioid treatment plan with their provider. Functional ability and status Nutritional status Physical activity Advanced directives List of other physicians Hospitalizations, surgeries, and ER visits in previous 12 months Vitals Screenings to include cognitive, depression, and falls Referrals and appointments  In addition, I have reviewed and discussed with patient certain preventive protocols, quality metrics, and best practice recommendations. A written personalized care plan for preventive services as well as general preventive health recommendations were provided to patient.     [name],  LPN   [id number]/61/[id number]   Nurse Notes:   None

## 2023-05-02 ENCOUNTER — Other Ambulatory Visit: Payer: Self-pay | Admitting: Family Medicine

## 2023-05-02 DIAGNOSIS — G894 Chronic pain syndrome: Secondary | ICD-10-CM

## 2023-05-02 DIAGNOSIS — G8929 Other chronic pain: Secondary | ICD-10-CM

## 2023-05-02 NOTE — Addendum Note (Signed)
Addended by: Weyman Croon E on: 05/02/2023 12:41 PM   Modules accepted: Orders

## 2023-05-02 NOTE — Telephone Encounter (Signed)
Prescription Request  05/02/2023  LOV: 02/02/2023  What is the name of the medication or equipment? Oxycodone HCl 10 MG TABS   Have you contacted your pharmacy to request a refill? No   Which pharmacy would you like this sent to?   [id number] - [place], [place] - [address] AT COR[address] OF [facility] [address] [place] [address] Phone: [phone] Fax: [phone]    Patient notified that their request is being sent to the clinical staff for review and that they should receive a response within 2 business days.   Please advise at Mobile [phone] (mobile)

## 2023-05-02 NOTE — Telephone Encounter (Signed)
Okay for refill?  

## 2023-05-03 MED ORDER — OXYCODONE HCL 10 MG PO TABS: 10.0000 mg | ORAL_TABLET | Freq: Two times a day (BID) | ORAL | 0 refills | Status: DC

## 2023-06-08 ENCOUNTER — Observation Stay (HOSPITAL_BASED_OUTPATIENT_CLINIC_OR_DEPARTMENT_OTHER)
Admission: EM | Admit: 2023-06-08 | Discharge: 2023-06-09 | Disposition: A | Discharge status: Home / Self Care | Payer: Medicare Other | Source: Home / Self Care | Attending: Emergency Medicine | Admitting: Emergency Medicine

## 2023-06-08 ENCOUNTER — Encounter (HOSPITAL_BASED_OUTPATIENT_CLINIC_OR_DEPARTMENT_OTHER): Payer: Self-pay

## 2023-06-08 ENCOUNTER — Emergency Department (HOSPITAL_BASED_OUTPATIENT_CLINIC_OR_DEPARTMENT_OTHER): Payer: Medicare Other

## 2023-06-08 DIAGNOSIS — G454 Transient global amnesia: Principal | ICD-10-CM

## 2023-06-08 DIAGNOSIS — R2990 Unspecified symptoms and signs involving the nervous system: Secondary | ICD-10-CM

## 2023-06-08 DIAGNOSIS — I69154 Hemiplegia and hemiparesis following nontraumatic intracerebral hemorrhage affecting left non-dominant side: Secondary | ICD-10-CM | POA: Diagnosis not present

## 2023-06-08 DIAGNOSIS — I6381 Other cerebral infarction due to occlusion or stenosis of small artery: Secondary | ICD-10-CM | POA: Insufficient documentation

## 2023-06-08 DIAGNOSIS — Z79899 Other long term (current) drug therapy: Secondary | ICD-10-CM | POA: Diagnosis not present

## 2023-06-08 DIAGNOSIS — I69320 Aphasia following cerebral infarction: Secondary | ICD-10-CM | POA: Insufficient documentation

## 2023-06-08 DIAGNOSIS — E785 Hyperlipidemia, unspecified: Secondary | ICD-10-CM | POA: Diagnosis present

## 2023-06-08 DIAGNOSIS — I6782 Cerebral ischemia: Secondary | ICD-10-CM | POA: Diagnosis not present

## 2023-06-08 DIAGNOSIS — R4182 Altered mental status, unspecified: Principal | ICD-10-CM | POA: Insufficient documentation

## 2023-06-08 DIAGNOSIS — I10 Essential (primary) hypertension: Secondary | ICD-10-CM | POA: Diagnosis not present

## 2023-06-08 DIAGNOSIS — Z8679 Personal history of other diseases of the circulatory system: Secondary | ICD-10-CM

## 2023-06-08 DIAGNOSIS — R9431 Abnormal electrocardiogram [ECG] [EKG]: Secondary | ICD-10-CM | POA: Diagnosis not present

## 2023-06-08 DIAGNOSIS — Z7982 Long term (current) use of aspirin: Secondary | ICD-10-CM | POA: Diagnosis not present

## 2023-06-08 DIAGNOSIS — R29818 Other symptoms and signs involving the nervous system: Secondary | ICD-10-CM | POA: Diagnosis not present

## 2023-06-08 DIAGNOSIS — F1290 Cannabis use, unspecified, uncomplicated: Secondary | ICD-10-CM | POA: Diagnosis present

## 2023-06-08 DIAGNOSIS — G894 Chronic pain syndrome: Secondary | ICD-10-CM | POA: Diagnosis present

## 2023-06-08 DIAGNOSIS — Z1152 Encounter for screening for COVID-19: Secondary | ICD-10-CM | POA: Insufficient documentation

## 2023-06-08 DIAGNOSIS — I69354 Hemiplegia and hemiparesis following cerebral infarction affecting left non-dominant side: Secondary | ICD-10-CM

## 2023-06-08 DIAGNOSIS — Z87891 Personal history of nicotine dependence: Secondary | ICD-10-CM | POA: Insufficient documentation

## 2023-06-08 DIAGNOSIS — I69328 Other speech and language deficits following cerebral infarction: Secondary | ICD-10-CM | POA: Insufficient documentation

## 2023-06-08 DIAGNOSIS — Z8673 Personal history of transient ischemic attack (TIA), and cerebral infarction without residual deficits: Secondary | ICD-10-CM | POA: Insufficient documentation

## 2023-06-08 DIAGNOSIS — R404 Transient alteration of awareness: Secondary | ICD-10-CM | POA: Diagnosis present

## 2023-06-08 DIAGNOSIS — K219 Gastro-esophageal reflux disease without esophagitis: Secondary | ICD-10-CM | POA: Diagnosis present

## 2023-06-08 DIAGNOSIS — I6930 Unspecified sequelae of cerebral infarction: Secondary | ICD-10-CM

## 2023-06-08 LAB — DIFFERENTIAL
Abs Immature Granulocytes: 0.01 10*3/uL (ref 0.00–0.07)
Basophils Absolute: 0 10*3/uL (ref 0.0–0.1)
Basophils Relative: 1 %
Eosinophils Absolute: 0.1 10*3/uL (ref 0.0–0.5)
Eosinophils Relative: 2 %
Immature Granulocytes: 0 %
Lymphocytes Relative: 33 %
Lymphs Abs: 2 10*3/uL (ref 0.7–4.0)
Monocytes Absolute: 0.6 10*3/uL (ref 0.1–1.0)
Monocytes Relative: 10 %
Neutro Abs: 3.3 10*3/uL (ref 1.7–7.7)
Neutrophils Relative %: 54 %

## 2023-06-08 LAB — CBG MONITORING, ED: Glucose-Capillary: 72 mg/dL (ref 70–99)

## 2023-06-08 LAB — COMPREHENSIVE METABOLIC PANEL
ALT: 19 U/L (ref 0–44)
AST: 16 U/L (ref 15–41)
Albumin: 4.6 g/dL (ref 3.5–5.0)
Alkaline Phosphatase: 77 U/L (ref 38–126)
Anion gap: 8 (ref 5–15)
BUN: 11 mg/dL (ref 8–23)
CO2: 29 mmol/L (ref 22–32)
Calcium: 9.7 mg/dL (ref 8.9–10.3)
Chloride: 103 mmol/L (ref 98–111)
Creatinine, Ser: 0.98 mg/dL (ref 0.61–1.24)
GFR, Estimated: >60 mL/min (ref >60)
Glucose, Bld: 81 mg/dL (ref 70–99)
Potassium: 3.9 mmol/L (ref 3.5–5.1)
Sodium: 140 mmol/L (ref 135–145)
Total Bilirubin: 0.5 mg/dL (ref 0.3–1.2)
Total Protein: 7.7 g/dL (ref 6.5–8.1)

## 2023-06-08 LAB — URINALYSIS, ROUTINE W REFLEX MICROSCOPIC
Bilirubin Urine: NEGATIVE
Glucose, UA: NEGATIVE mg/dL
Hgb urine dipstick: NEGATIVE
Ketones, ur: NEGATIVE mg/dL
Leukocytes,Ua: NEGATIVE
Nitrite: NEGATIVE
Protein, ur: NEGATIVE mg/dL
Specific Gravity, Urine: 1.024 (ref 1.005–1.030)
pH: 5.5 (ref 5.0–8.0)

## 2023-06-08 LAB — RAPID URINE DRUG SCREEN, HOSP PERFORMED
Amphetamines: NOT DETECTED
Barbiturates: NOT DETECTED
Benzodiazepines: NOT DETECTED
Cocaine: NOT DETECTED
Opiates: NOT DETECTED
Tetrahydrocannabinol: POSITIVE — AB

## 2023-06-08 LAB — ETHANOL: Alcohol, Ethyl (B): <10 mg/dL (ref <10)

## 2023-06-08 LAB — CBC
HCT: 43.5 % (ref 39.0–52.0)
Hemoglobin: 14.7 g/dL (ref 13.0–17.0)
MCH: 31.3 pg (ref 26.0–34.0)
MCHC: 33.8 g/dL (ref 30.0–36.0)
MCV: 92.8 fL (ref 80.0–100.0)
Platelets: 190 10*3/uL (ref 150–400)
RBC: 4.69 MIL/uL (ref 4.22–5.81)
RDW: 12.6 % (ref 11.5–15.5)
WBC: 6.1 10*3/uL (ref 4.0–10.5)
nRBC: 0 % (ref 0.0–0.2)

## 2023-06-08 LAB — PROTIME-INR
INR: 1.1 (ref 0.8–1.2)
Prothrombin Time: 14 seconds (ref 11.4–15.2)

## 2023-06-08 LAB — APTT: aPTT: 28 seconds (ref 24–36)

## 2023-06-08 MED ORDER — SODIUM CHLORIDE 0.9% FLUSH
3.0000 mL | Freq: Once | INTRAVENOUS | Status: AC
  Administered 2023-06-08: 3 mL via INTRAVENOUS

## 2023-06-08 NOTE — ED Notes (Signed)
Just returned from CT and speaking with teleneurology. Pt. A&o x 4 at present.

## 2023-06-08 NOTE — Progress Notes (Addendum)
2023: Tele stroke cart activated at this time. EDP at bedside assessing patient.  2029: Pt to CT at this time.  2032: TSP paged at this time.  2034: TSP on camera at this time.  2037: CT complete. TSP assessing patient in CT room.  2045: Pt returned from CT.  2050: TSP and TSRN off camera at this time. TSP to follow up with EDP for plan of care.  2054: Results of NCCT provided to TSP via secure message.

## 2023-06-08 NOTE — ED Triage Notes (Signed)
Pt w wife, states that pt said "I feel like something is off- like I"m sitting here but not sitting here." Wife reports that on arrival, he was confused as to location. LNW approx 1hr PTA.   Hx 3 strokes (thromb x1, hemo x2) w speech difficulty/ slurring, difficulty swallowing, altered gait, L arm "problems."

## 2023-06-08 NOTE — Consult Note (Signed)
[facility] Tele[facility]urology Consult Services   Patient Name:   Richard Vaughn, Richard Vaughn Date of Birth:   08-Jun-1951 Identification Number:   MRN - [id number] Date of Service:   06/08/2023 20:32:23  Diagnosis:       R41.0 - Disorientation, unspecified  Impression:      72 year old male with 3 prior CVA (including 2 hemorrhagic and 1 ischemic with residual speech deficits, left deficits and walking problems) who presents with altered mental status and balance issues that he feels started at 7PM. He feels he is sitting but not sitting. His wife states he was on a stationary bike and he commented he didn't known where he was. There was no new weakness, speech changes, visual changes, vertigo, convulsions or seizures. There have been no new medications. NIHSS 4 (old symptoms that may be slightly worse). Head CT shows no hemorrhage.    Possibilities include toxic-metabolic issue with unmasking of old CVA symptoms, seizure and new CVA. He will need workup including toxic-metabolic workup, MRI brain and MRA head and neck. We can guide further management based on this including further CVA workup and EEG. We can continue aspirin. See below.  Our recommendations are outlined below.  Recommendations:        Telemetry        Neuro Checks       Bedside Swallow Eval       DVT Prophylaxis- mechanical       IV Fluids, Normal Saline       Head of Bed 30 Degrees       Euglycemia and Avoid Hyperthermia (PRN Acetaminophen)       Inhouse neurology consult    ------------------------------------------------------------------------------  Advanced Imaging: Advanced Imaging Deferred because:  He has mild worsening of chronic deficits without new historic or examination features to suggest LVO.   Metrics: Last Known Well: 06/08/2023 19:00:00 [facility] Notification Time: 06/08/2023 29:56:21 Arrival Time: 06/08/2023 20:11:00 Stamp Time: 06/08/2023 20:32:23 Initial Response Time:  06/08/2023 20:33:39 Symptoms: altered mental status. Initial patient interaction: 06/08/2023 20:37:06 NIHSS Assessment Completed: 06/08/2023 20:43:41 Patient is not a candidate for Thrombolytic. Thrombolytic Medical Decision: 06/08/2023 20:43:42 Patient was not deemed candidate for Thrombolytic because of following reasons: Current or Previous ICH.  I personally Reviewed the CT Head and it Showed no hemorrhage  Primary Provider Notified of Diagnostic Impression and Management Plan on: 06/08/2023 20:55:09    ------------------------------------------------------------------------------  History of Present Illness: Patient is a 72 year old Male.  Patient was brought by private transportation with symptoms of altered mental status. 72 year old male with 3 prior CVA (including 2 hemorrhagic and 1 ischemic with residual speech deficits, left deficits and walking problems) who presents with altered mental status and balance issues that he feels started at [facility]. He feels he is sitting but not sitting. His wife states he was on a stationary bike and he commented he didn't known where he was. There was no new weakness, speech changes, visual changes, vertigo, convulsions or seizures. There have been no new medications.  I met with him with his wife.     Past Medical History:      Stroke  Medications:  No Anticoagulant use  Antiplatelet use: Yes Aspirin  Allergies:  Reviewed Description: In EHR  Social History: Smoking: Former  Family History:  There Is Family History [id number] and Sisters had CVA  ROS : A complete pertinent Review of Systems was performed and was negative except mentioned in HPI.       Examination: BP(120/79), Pulse(104), 1A: Level  of Consciousness - Alert; keenly responsive + 0 1B: Ask Month and Age - Both Questions Right + 0 1C: Blink Eyes & Squeeze Hands - Performs Both Tasks + 0 2: Test Horizontal Extraocular Movements - Normal + 0 3: Test Visual  Fields - No Visual Loss + 0 4: Test Facial Palsy (Use Grimace if Obtunded) - Minor paralysis (flat nasolabial fold, smile asymmetry) + 1 5A: Test Left Arm Motor Drift - Drift, but doesn't hit bed + 1 5B: Test Right Arm Motor Drift - No Drift for 10 Seconds + 0 6A: Test Left Leg Motor Drift - Drift, but doesn't hit bed + 1 6B: Test Right Leg Motor Drift - No Drift for 5 Seconds + 0 7: Test Limb Ataxia (FNF/Heel-Shin) - No Ataxia + 0 8: Test Sensation - Normal; No sensory loss + 0 9: Test Language/Aphasia - Normal; No aphasia + 0 10: Test Dysarthria - Mild-Moderate Dysarthria: Slurring but can be understood + 1 11: Test Extinction/Inattention - No abnormality + 0  NIHSS Score: 4  NIHSS Free Text : The dysathria is his baseline per wife. The left arm and face might be slightly weaker per wife. He could stand and walk with his baseline imbalance (maybe more cautious).  Pre-Morbid Modified Rankin Scale: 2 Points = Slight disability; unable to carry out all previous activities, but able to look after own affairs without assistance  Spoke with : Dr.Gray  This consult was conducted in real time using interactive audio and Immunologist. Patient was informed of the technology being used for this visit and agreed to proceed. Patient located in hospital and provider located at home/office setting.   Patient is being evaluated for possible acute neurologic impairment and high probability of imminent or life-threatening deterioration. I spent total of 30 minutes providing care to this patient, including time for face to face visit via telemedicine, review of medical records, imaging studies and discussion of findings with providers, the patient and/or family.   Dr Ferdinand Cava   [facility] For Inpatient follow-up with [facility] physician please call [facility] [phone]. This is not an outpatient service. Post hospital discharge, please contact hospital directly.  Please do not  communicate with [facility] physicians via secure chat. If you have any questions, Please contact [facility]. Please call or reconsult our service if there are any clinical or diagnostic changes.

## 2023-06-08 NOTE — ED Provider Notes (Signed)
[place] EMERGENCY DEPARTMENT AT [facility] Provider Note  CSN: [id number] Arrival date & time: 06/08/23 2011  Chief Complaint(s) Altered Mental Status  HPI Richard Vaughn is a 72 y.o. male with past medical history as below, significant for barrett's esophagus, GERD, CVA, HTN, depression who presents to the ED with complaint of confusion/ams. Pt was at gym just pta, around 1900-1930 wife reported pt with confusion, unsure of his location and unsure where he was sitting. He has residual deficits from prior stroke but wife feels his symptoms appear worse today/ this evening then they were prior today. He is on ASA, no thinners. No recent diet/med changes, no fevers. Stroke alert was activated in triage   Past Medical History Past Medical History:  Diagnosis Date   Barrett's esophagus    Chronic back pain    Depression    Gastritis    Mild   Gastroparesis    GERD (gastroesophageal reflux disease)    Headache    History of kidney stones    Hyperglycemia    Hypertension    Nephrolithiasis    Pneumonia    Covid pneumonia   Renal cyst    Sleep apnea    Stricture and stenosis of esophagus    Stroke (HCC) 07/2015   Vision abnormalities    Patient Active Problem List   Diagnosis Date Noted   Drug-induced constipation 06/08/2022   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) 12/09/2021   Depression, major, recurrent, mild (HCC)    Sleep disturbance    Urinary retention    Bradycardia    Essential hypertension    Temperature elevated    Left thyroid nodule    Treatment-emergent central sleep apnea 10/25/2018   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) 10/25/2018   Chronic pain syndrome    Chronic bilateral low back pain    Benign essential HTN    Hyperlipidemia    History of CVA with residual deficit    Dysphagia, post-stroke    ICH (intracerebral hemorrhage) (HCC) - R thalmic/PLIC d/t HTN 29/52/8413    Insomnia 07/13/2017   PAD (peripheral artery disease) (HCC) 07/13/2017   Hyperlipidemia with target LDL less than 70 07/13/2017   Stroke-like symptom 09/22/2016   Paresthesia 09/22/2016   CVA (cerebral vascular accident) (HCC) 09/22/2016   Cerebrovascular accident (CVA) due to thrombosis of left carotid artery (HCC) 09/23/2015   Primary snoring 09/23/2015   Hypersomnia with sleep apnea 09/23/2015   Obstructive sleep apnea 09/23/2015   Lacunar infarct, acute (HCC) 08/25/2015   Sleep apnea 08/25/2015   Dysarthria    CVD (cardiovascular disease) 08/02/2015   Acute hyperglycemia 08/02/2015   Systolic hypertension with cerebrovascular disease 12/27/2014   Epistaxis 07/28/2012   NEPHROLITHIASIS 05/23/2009   BARRETTS ESOPHAGUS 02/20/2009   Gastroparesis 02/20/2009   RADICULOPATHY 10/21/2008   GERD 10/08/2008   ESOPHAGITIS 08/15/2008   ESOPHAGEAL STRICTURE 08/15/2008   GASTRITIS, ACUTE 08/15/2008   RENAL CYST 08/14/2008   HEMATURIA, MICROSCOPIC, HX OF 08/08/2008   PULMONARY NODULE 01/10/2008   Home Medication(s) Prior to Admission medications   Medication Sig Start Date End Date Taking? Authorizing Provider  amLODipine (NORVASC) 2.5 MG tablet Take 1 tablet (2.5 mg total) by mouth at bedtime. 06/08/22   [place], [name], MD  aspirin EC 81 MG tablet Take 1 tablet (81 mg total) by mouth daily. Swallow whole. 06/10/20   Ihor Austin, NP  DULoxetine (CYMBALTA) 60 MG capsule Take 1 capsule (60 mg total) by  mouth daily. 01/24/23   [place], [name], MD  esomeprazole (NEXIUM) 40 MG capsule Take 1 capsule (40 mg total) by mouth 2 (two) times daily before a meal. 10/20/22   Doree Albee, PA-C  lovastatin (MEVACOR) 20 MG tablet 1 tab every other day. 06/08/22   [place], [name], MD  olmesartan (BENICAR) 20 MG tablet Take 1 tablet (20 mg total) by mouth daily. 03/02/23   [place], [name], MD  Oxycodone HCl 10 MG TABS Take 1 tablet (10 mg total) by mouth 2 (two) times daily. 05/03/23   [place], [name],  MD  traZODone (DESYREL) 50 MG tablet TAKE 1 TABLET AT BEDTIME ASNEEDED FOR SLEEP 03/07/23   [place], [name], MD                                                                                                                                    Past Surgical History Past Surgical History:  Procedure Laterality Date   CERVICAL DISC SURGERY  2012   COLONOSCOPY     LUMBAR DISC SURGERY  2005, 2010   replaced L4 and L5   SHOULDER ARTHROSCOPY WITH ROTATOR CUFF REPAIR Left 08/14/2020   Procedure: SHOULDER ARTHROSCOPY WITH ROTATOR CUFF REPAIR, SUBACROMIAL DECOMPRESSION;  Surgeon: Jones Broom, MD;  Location: [facility];  Service: Orthopedics;  Laterality: Left;   UPPER GASTROINTESTINAL ENDOSCOPY     Family History Family History  Problem Relation Age of Onset   Hypertension Father    Stroke Father    Hypertension Mother    Liver cancer Mother    Hypertension Sister        2 other sisters has also   Stroke Sister    Stroke Sister    Hypertension Brother        2nd brother has also   Colon cancer Neg Hx    Esophageal cancer Neg Hx    Rectal cancer Neg Hx    Stomach cancer Neg Hx    Colon polyps Neg Hx     Social History Social History   Tobacco Use   Smoking status: Former    Current packs/day: 0.00    Average packs/day: 0.5 packs/day for 30.0 years (15.0 ttl pk-yrs)    Types: Cigarettes    Start date: 04/15/1988    Quit date: 04/15/2018    Years since quitting: 5.1   Smokeless tobacco: Never  Vaping Use   Vaping status: Never Used  Substance Use Topics   Alcohol use: No    Alcohol/week: 0.0 standard drinks of alcohol   Drug use: Never    Comment: last use 04/2018   Allergies Patient has no known allergies.  Review of Systems Review of Systems  Reason unable to perform ROS: dysarthria.  Constitutional:  Negative for chills and fever.  Respiratory:  Negative for chest tightness and shortness of breath.   Cardiovascular:  Negative for chest pain and palpitations.   Gastrointestinal:  Negative for abdominal pain, nausea and vomiting.  Genitourinary:  Negative for difficulty urinating and dysuria.  Skin:  Negative for rash.  Psychiatric/Behavioral:  Positive for confusion.     Physical Exam Vital Signs  I have reviewed the triage vital signs BP 129/84   Pulse (!) 55   Resp 14   SpO2 98%  Physical Exam Vitals and nursing note reviewed. Exam conducted with a chaperone present.  Constitutional:      General: He is not in acute distress.    Appearance: He is well-developed. He is not ill-appearing.  HENT:     Head: Normocephalic and atraumatic.     Right Ear: External ear normal.     Left Ear: External ear normal.     Mouth/Throat:     Mouth: Mucous membranes are moist.  Eyes:     General: No scleral icterus. Cardiovascular:     Rate and Rhythm: Normal rate and regular rhythm.     Pulses: Normal pulses.     Heart sounds: Normal heart sounds.  Pulmonary:     Effort: Pulmonary effort is normal. No respiratory distress.     Breath sounds: Normal breath sounds.  Abdominal:     General: Abdomen is flat.     Palpations: Abdomen is soft.     Tenderness: There is no abdominal tenderness.  Musculoskeletal:     Cervical back: No rigidity.     Right lower leg: No edema.     Left lower leg: No edema.  Skin:    General: Skin is warm and dry.     Capillary Refill: Capillary refill takes less than 2 seconds.  Neurological:     Mental Status: He is alert. He is disoriented.     GCS: GCS eye subscore is 4. GCS verbal subscore is 5. GCS motor subscore is 6.     Cranial Nerves: Dysarthria and facial asymmetry present.     Motor: Weakness present.     Comments: Difficult to complete FTN testing 2/2 residual weakness  Gait testing deferred 2/2 pt safety   Psychiatric:        Mood and Affect: Mood normal.        Behavior: Behavior normal.     ED Results and Treatments Labs (all labs ordered are listed, but only abnormal results are  displayed) Labs Reviewed  RAPID URINE DRUG SCREEN, HOSP PERFORMED - Abnormal; Notable for the following components:      Result Value   Tetrahydrocannabinol POSITIVE (*)    All other components within normal limits  PROTIME-INR  APTT  CBC  DIFFERENTIAL  COMPREHENSIVE METABOLIC PANEL  ETHANOL  URINALYSIS, ROUTINE W REFLEX MICROSCOPIC  CBG MONITORING, ED                                                                                                                          Radiology CT HEAD CODE STROKE WO CONTRAST  Result Date: 06/08/2023 CLINICAL DATA:  Code stroke. Initial evaluation  for neuro deficit, stroke. EXAM: CT HEAD WITHOUT CONTRAST TECHNIQUE: Contiguous axial images were obtained from the base of the skull through the vertex without intravenous contrast. RADIATION DOSE REDUCTION: This exam was performed according to the departmental dose-optimization program which includes automated exposure control, adjustment of the mA and/or kV according to patient size and/or use of iterative reconstruction technique. COMPARISON:  Prior CT from 06/04/2020. FINDINGS: Brain: Generalized age-related cerebral atrophy. Patchy and confluent T2/FLAIR hyperintensity involving the supratentorial cerebral white matter, severe in nature. Multiple remote lacunar infarcts present about the left basal ganglia. Additional chronic right thalamic infarct. No acute intracranial hemorrhage. No visible acute large vessel territory infarct. No mass lesion or midline shift. No hydrocephalus or extra-axial fluid collection. Vascular: No abnormal hyperdense vessel. Scattered vascular calcifications noted within the carotid siphons. Skull: Scalp soft tissues demonstrate no acute finding. Calvarium intact. Sinuses/Orbits: Globes and orbital soft tissues within normal limits. Visualized paranasal sinuses are clear. Trace left mastoid effusion, of doubtful significance. Other: None. ASPECTS [facility] Stroke Program Early CT  Score) - Ganglionic level infarction (caudate, lentiform nuclei, internal capsule, insula, M1-M3 cortex): 7 - Supraganglionic infarction (M4-M6 cortex): 3 Total score (0-10 with 10 being normal): 10 IMPRESSION: 1. No acute intracranial abnormality. 2. ASPECTS is 10. 3. Severe chronic microvascular ischemic disease, with multiple remote lacunar infarcts about the left basal ganglia and right thalamus. Results were called by telephone at the time of interpretation on 06/08/2023 at 8:45 pm to provider Tanda Rockers , who verbally acknowledged these results. Electronically Signed   By: Rise Mu M.D.   On: 06/08/2023 20:50    Pertinent labs & imaging results that were available during my care of the patient were reviewed by me and considered in my medical decision making (see MDM for details).  Medications Ordered in ED Medications  sodium chloride flush (NS) 0.9 % injection 3 mL (3 mLs Intravenous Given by Other 06/08/23 2102)                                                                                                                                     Procedures .Critical Care  Performed by: Sloan Leiter, DO Authorized by: Sloan Leiter, DO   Critical care provider statement:    Critical care time (minutes):  49   Critical care time was exclusive of:  Separately billable procedures and treating other patients   Critical care was necessary to treat or prevent imminent or life-threatening deterioration of the following conditions:  CNS failure or compromise   Critical care was time spent personally by me on the following activities:  Development of treatment plan with patient or surrogate, discussions with consultants, evaluation of patient's response to treatment, examination of patient, ordering and review of laboratory studies, ordering and review of radiographic studies, ordering and performing treatments and interventions, pulse oximetry, re-evaluation of patient's condition, review  of old charts and obtaining history from  patient or surrogate   Care discussed with: admitting provider     (including critical care time)  Medical Decision Making / ED Course    Medical Decision Making:    DAISON BRAXTON is a 72 y.o. male with past medical history as below, significant for barrett's esophagus, GERD, CVA, HTN, depression who presents to the ED with complaint of confusion/ams. . The complaint involves an extensive differential diagnosis and also carries with it a high risk of complications and morbidity.  Serious etiology was considered. Ddx includes but is not limited to: Differential diagnoses for altered mental status includes but is not exclusive to alcohol, illicit or prescription medications, intracranial pathology such as stroke, intracerebral hemorrhage, fever or infectious causes including sepsis, hypoxemia, uremia, trauma, endocrine related disorders such as diabetes, hypoglycemia, thyroid-related diseases, etc.   Complete initial physical exam performed, notably the patient  was dysarthria noted, Aox2 person/time but disoriented to place .    Reviewed and confirmed nursing documentation for past medical history, family history, social history.  Vital signs reviewed.    Clinical Course as of 06/08/23 2355  Wed Jun 08, 2023  2053 Callback from rads on [facility], no ICH or obvious acute CVA [SG]  2212 Spoke with tele-neuro, recommend MRI/MRA, admit  [SG]  2316 Tetrahydrocannabinol(!): POSITIVE [SG]    Clinical Course User Index [SG] Sloan Leiter, DO   See neuro note Labs stable Imaging stable  Patient with mental status change, possibly worsening of underlying residual stroke deficits.  Evaluate by telestroke, recommend admission with stroke workup.  Patient/family agreeable.  Pending callback from hospitalist  Admitted [facility]        Additional history obtained: -Additional history obtained from spouse -External records from outside source obtained and  reviewed including: Chart review including previous notes, labs, imaging, consultation notes including primary care recommendation, prior labs and imaging   Lab Tests: -I ordered, reviewed, and interpreted labs.   The pertinent results include:   Labs Reviewed  RAPID URINE DRUG SCREEN, HOSP PERFORMED - Abnormal; Notable for the following components:      Result Value   Tetrahydrocannabinol POSITIVE (*)    All other components within normal limits  PROTIME-INR  APTT  CBC  DIFFERENTIAL  COMPREHENSIVE METABOLIC PANEL  ETHANOL  URINALYSIS, ROUTINE W REFLEX MICROSCOPIC  CBG MONITORING, ED    Notable for THC+  EKG   EKG Interpretation Date/Time:  Wednesday June 08 2023 20:23:31 EDT Ventricular Rate:  69 PR Interval:  138 QRS Duration:  96 QT Interval:  376 QTC Calculation: 402 R Axis:   17  Text Interpretation: Normal sinus rhythm Normal ECG When compared with ECG of 22-Feb-2020 21:35, PREVIOUS ECG IS PRESENT no stemi Confirmed by Tanda Rockers ([id number]) on 06/08/2023 11:50:13 PM         Imaging Studies ordered: I ordered imaging studies including CTH I independently visualized the following imaging with scope of interpretation limited to determining acute life threatening conditions related to emergency care; findings noted above, significant for neg for ICH I independently visualized and interpreted imaging. I agree with the radiologist interpretation   Medicines ordered and prescription drug management: Meds ordered this encounter  Medications   sodium chloride flush (NS) 0.9 % injection 3 mL    -I have reviewed the patients home medicines and have made adjustments as needed   Consultations Obtained: I requested consultation with the telestroke,  and discussed lab and imaging findings as well as pertinent plan - they recommend: mri/mra, admit  Cardiac Monitoring: The patient was maintained on a cardiac monitor.  I personally viewed and interpreted the cardiac  monitored which showed an underlying rhythm of: NSR  Social Determinants of Health:  Diagnosis or treatment significantly limited by social determinants of health: former smoker   Reevaluation: After the interventions noted above, I reevaluated the patient and found that they have stayed the same  Co morbidities that complicate the patient evaluation  Past Medical History:  Diagnosis Date   Barrett's esophagus    Chronic back pain    Depression    Gastritis    Mild   Gastroparesis    GERD (gastroesophageal reflux disease)    Headache    History of kidney stones    Hyperglycemia    Hypertension    Nephrolithiasis    Pneumonia    Covid pneumonia   Renal cyst    Sleep apnea    Stricture and stenosis of esophagus    Stroke (HCC) 07/2015   Vision abnormalities       Dispostion: Disposition decision including need for hospitalization was considered, and patient admitted to the hospital.    Final Clinical Impression(s) / ED Diagnoses Final diagnoses:  Altered mental status, unspecified altered mental status type  Stroke-like symptoms     This chart was dictated using voice recognition software.  Despite best efforts to proofread,  errors can occur which can change the documentation meaning.    Sloan Leiter, DO 06/08/23 2355

## 2023-06-09 ENCOUNTER — Observation Stay (HOSPITAL_COMMUNITY): Payer: Medicare Other

## 2023-06-09 ENCOUNTER — Other Ambulatory Visit: Payer: Self-pay

## 2023-06-09 DIAGNOSIS — K219 Gastro-esophageal reflux disease without esophagitis: Secondary | ICD-10-CM

## 2023-06-09 DIAGNOSIS — I69320 Aphasia following cerebral infarction: Secondary | ICD-10-CM | POA: Diagnosis not present

## 2023-06-09 DIAGNOSIS — R569 Unspecified convulsions: Secondary | ICD-10-CM

## 2023-06-09 DIAGNOSIS — G894 Chronic pain syndrome: Secondary | ICD-10-CM

## 2023-06-09 DIAGNOSIS — I10 Essential (primary) hypertension: Secondary | ICD-10-CM | POA: Diagnosis not present

## 2023-06-09 DIAGNOSIS — G459 Transient cerebral ischemic attack, unspecified: Secondary | ICD-10-CM | POA: Diagnosis not present

## 2023-06-09 DIAGNOSIS — I69328 Other speech and language deficits following cerebral infarction: Secondary | ICD-10-CM | POA: Diagnosis not present

## 2023-06-09 DIAGNOSIS — Z7982 Long term (current) use of aspirin: Secondary | ICD-10-CM | POA: Diagnosis not present

## 2023-06-09 DIAGNOSIS — Z87891 Personal history of nicotine dependence: Secondary | ICD-10-CM | POA: Diagnosis not present

## 2023-06-09 DIAGNOSIS — I6930 Unspecified sequelae of cerebral infarction: Secondary | ICD-10-CM | POA: Diagnosis not present

## 2023-06-09 DIAGNOSIS — R29818 Other symptoms and signs involving the nervous system: Secondary | ICD-10-CM | POA: Diagnosis not present

## 2023-06-09 DIAGNOSIS — F1290 Cannabis use, unspecified, uncomplicated: Secondary | ICD-10-CM

## 2023-06-09 DIAGNOSIS — R404 Transient alteration of awareness: Secondary | ICD-10-CM | POA: Diagnosis present

## 2023-06-09 DIAGNOSIS — I69154 Hemiplegia and hemiparesis following nontraumatic intracerebral hemorrhage affecting left non-dominant side: Secondary | ICD-10-CM | POA: Diagnosis not present

## 2023-06-09 DIAGNOSIS — I6782 Cerebral ischemia: Secondary | ICD-10-CM | POA: Diagnosis not present

## 2023-06-09 DIAGNOSIS — Z8679 Personal history of other diseases of the circulatory system: Secondary | ICD-10-CM | POA: Diagnosis not present

## 2023-06-09 DIAGNOSIS — Z8673 Personal history of transient ischemic attack (TIA), and cerebral infarction without residual deficits: Secondary | ICD-10-CM | POA: Diagnosis not present

## 2023-06-09 DIAGNOSIS — I6381 Other cerebral infarction due to occlusion or stenosis of small artery: Secondary | ICD-10-CM | POA: Diagnosis not present

## 2023-06-09 DIAGNOSIS — R4182 Altered mental status, unspecified: Secondary | ICD-10-CM | POA: Diagnosis not present

## 2023-06-09 DIAGNOSIS — Z1152 Encounter for screening for COVID-19: Secondary | ICD-10-CM | POA: Diagnosis not present

## 2023-06-09 DIAGNOSIS — Z79899 Other long term (current) drug therapy: Secondary | ICD-10-CM | POA: Diagnosis not present

## 2023-06-09 DIAGNOSIS — E785 Hyperlipidemia, unspecified: Secondary | ICD-10-CM

## 2023-06-09 LAB — CBC WITH DIFFERENTIAL/PLATELET
Abs Immature Granulocytes: 0.01 10*3/uL (ref 0.00–0.07)
Basophils Absolute: 0 10*3/uL (ref 0.0–0.1)
Basophils Relative: 1 %
Eosinophils Absolute: 0.1 10*3/uL (ref 0.0–0.5)
Eosinophils Relative: 1 %
HCT: 40.6 % (ref 39.0–52.0)
Hemoglobin: 13.9 g/dL (ref 13.0–17.0)
Immature Granulocytes: 0 %
Lymphocytes Relative: 34 %
Lymphs Abs: 2.1 10*3/uL (ref 0.7–4.0)
MCH: 30.9 pg (ref 26.0–34.0)
MCHC: 34.2 g/dL (ref 30.0–36.0)
MCV: 90.2 fL (ref 80.0–100.0)
Monocytes Absolute: 0.6 10*3/uL (ref 0.1–1.0)
Monocytes Relative: 9 %
Neutro Abs: 3.4 10*3/uL (ref 1.7–7.7)
Neutrophils Relative %: 55 %
Platelets: 184 10*3/uL (ref 150–400)
RBC: 4.5 MIL/uL (ref 4.22–5.81)
RDW: 12.3 % (ref 11.5–15.5)
WBC: 6.2 10*3/uL (ref 4.0–10.5)
nRBC: 0 % (ref 0.0–0.2)

## 2023-06-09 LAB — COMPREHENSIVE METABOLIC PANEL
ALT: 22 U/L (ref 0–44)
AST: 18 U/L (ref 15–41)
Albumin: 3.9 g/dL (ref 3.5–5.0)
Alkaline Phosphatase: 73 U/L (ref 38–126)
Anion gap: 8 (ref 5–15)
BUN: 7 mg/dL — ABNORMAL LOW (ref 8–23)
CO2: 27 mmol/L (ref 22–32)
Calcium: 9.2 mg/dL (ref 8.9–10.3)
Chloride: 103 mmol/L (ref 98–111)
Creatinine, Ser: 0.89 mg/dL (ref 0.61–1.24)
GFR, Estimated: >60 mL/min (ref >60)
Glucose, Bld: 98 mg/dL (ref 70–99)
Potassium: 3.7 mmol/L (ref 3.5–5.1)
Sodium: 138 mmol/L (ref 135–145)
Total Bilirubin: 0.9 mg/dL (ref 0.3–1.2)
Total Protein: 6.9 g/dL (ref 6.5–8.1)

## 2023-06-09 LAB — FOLATE: Folate: 26.7 ng/mL (ref >5.9)

## 2023-06-09 LAB — LIPID PANEL
Cholesterol: 164 mg/dL (ref 0–200)
HDL: 48 mg/dL (ref >40)
LDL Cholesterol: 101 mg/dL — ABNORMAL HIGH (ref 0–99)
Total CHOL/HDL Ratio: 3.4 RATIO
Triglycerides: 76 mg/dL (ref <150)
VLDL: 15 mg/dL (ref 0–40)

## 2023-06-09 LAB — MAGNESIUM: Magnesium: 2 mg/dL (ref 1.7–2.4)

## 2023-06-09 LAB — VITAMIN B12: Vitamin B-12: 706 pg/mL (ref 180–914)

## 2023-06-09 LAB — GLUCOSE, CAPILLARY: Glucose-Capillary: 175 mg/dL — ABNORMAL HIGH (ref 70–99)

## 2023-06-09 LAB — SARS CORONAVIRUS 2 BY RT PCR: SARS Coronavirus 2 by RT PCR: NEGATIVE

## 2023-06-09 LAB — TSH: TSH: 0.901 u[IU]/mL (ref 0.350–4.500)

## 2023-06-09 MED ORDER — PANTOPRAZOLE SODIUM 40 MG PO TBEC
40.0000 mg | DELAYED_RELEASE_TABLET | Freq: Two times a day (BID) | ORAL | Status: DC
  Administered 2023-06-09: 40 mg via ORAL
  Filled 2023-06-09: qty 1

## 2023-06-09 MED ORDER — MELATONIN 5 MG PO TABS: 10.0000 mg | ORAL_TABLET | Freq: Every day | ORAL | Status: DC

## 2023-06-09 MED ORDER — TRAZODONE HCL 50 MG PO TABS: 50.0000 mg | ORAL_TABLET | Freq: Every evening | ORAL | Status: DC | PRN

## 2023-06-09 MED ORDER — ACETAMINOPHEN 325 MG PO TABS: 650.0000 mg | ORAL_TABLET | Freq: Four times a day (QID) | ORAL | Status: DC | PRN

## 2023-06-09 MED ORDER — AMLODIPINE BESYLATE 2.5 MG PO TABS: 2.5000 mg | ORAL_TABLET | Freq: Every day | ORAL | Status: DC

## 2023-06-09 MED ORDER — IRBESARTAN 150 MG PO TABS
150.0000 mg | ORAL_TABLET | Freq: Every day | ORAL | Status: DC
  Filled 2023-06-09: qty 1

## 2023-06-09 MED ORDER — LORAZEPAM 2 MG/ML IJ SOLN
0.5000 mg | INTRAMUSCULAR | Status: DC | PRN
  Administered 2023-06-09: 1 mg via INTRAVENOUS
  Filled 2023-06-09: qty 1

## 2023-06-09 MED ORDER — ASPIRIN 81 MG PO TBEC
81.0000 mg | DELAYED_RELEASE_TABLET | Freq: Every day | ORAL | Status: DC
  Administered 2023-06-09: 81 mg via ORAL
  Filled 2023-06-09: qty 1

## 2023-06-09 MED ORDER — ACETAMINOPHEN 650 MG RE SUPP: 650.0000 mg | Freq: Four times a day (QID) | RECTAL | Status: DC | PRN

## 2023-06-09 MED ORDER — PRAVASTATIN SODIUM 40 MG PO TABS: 20.0000 mg | ORAL_TABLET | Freq: Every day | ORAL | Status: DC

## 2023-06-09 MED ORDER — OXYCODONE HCL 5 MG PO TABS
10.0000 mg | ORAL_TABLET | Freq: Two times a day (BID) | ORAL | Status: DC
  Administered 2023-06-09: 10 mg via ORAL
  Filled 2023-06-09: qty 2

## 2023-06-09 MED ORDER — ONDANSETRON HCL 4 MG/2ML IJ SOLN: 4.0000 mg | Freq: Four times a day (QID) | INTRAMUSCULAR | Status: DC | PRN

## 2023-06-09 MED ORDER — DOCUSATE SODIUM 100 MG PO CAPS
100.0000 mg | ORAL_CAPSULE | Freq: Every day | ORAL | Status: DC
  Administered 2023-06-09: 100 mg via ORAL
  Filled 2023-06-09: qty 1

## 2023-06-09 MED ORDER — DULOXETINE HCL 60 MG PO CPEP
60.0000 mg | ORAL_CAPSULE | Freq: Every day | ORAL | Status: DC
  Administered 2023-06-09: 60 mg via ORAL
  Filled 2023-06-09: qty 1

## 2023-06-09 MED ORDER — LOVASTATIN 20 MG PO TABS: 20.0000 mg | ORAL_TABLET | Freq: Every day | ORAL | 0 refills | Status: DC

## 2023-06-09 NOTE — Progress Notes (Signed)
Nursing Discharge Note  Name: Richard Vaughn MRN: [id number]  Admit Date: 06/08/2023  Discharge date: 06/09/2023   Richard Vaughn is to be discharged home per MD order.  AVS completed. Reviewed with patient at bedside by Discharge Coordinator RN Gust Brooms.  Highlighted copy provided for patient to take home.  Patient able to verbalize understanding of discharge instructions. PIV removed. Patient stable upon discharge.   Discharge Instructions     Ambulatory referral to Neurology   Complete by: As directed    An appointment is requested in approximately: 4 weeks   Diet - low sodium heart healthy   Complete by: As directed    Discharge instructions   Complete by: As directed    Symptoms were thought to be secondary to a episode of transient amnesia.  Workup did not note any concerns for a stroke or concern for seizure activity.   Increase activity slowly   Complete by: As directed        Allergies as of 06/09/2023   No Known Allergies      Medication List     TAKE these medications    amLODipine 2.5 MG tablet Commonly known as: NORVASC Take 1 tablet (2.5 mg total) by mouth at bedtime.   aspirin EC 81 MG tablet Take 1 tablet (81 mg total) by mouth daily. Swallow whole.   docusate sodium 100 MG capsule Commonly known as: COLACE Take 100 mg by mouth daily.   DULoxetine 60 MG capsule Commonly known as: CYMBALTA Take 1 capsule (60 mg total) by mouth daily.   esomeprazole 40 MG capsule Commonly known as: NEXIUM Take 1 capsule (40 mg total) by mouth 2 (two) times daily before a meal.   lovastatin 20 MG tablet Commonly known as: MEVACOR Take 1 tablet (20 mg total) by mouth at bedtime.   Melatonin 10 MG Tabs Take 1 tablet by mouth at bedtime.   olmesartan 20 MG tablet Commonly known as: BENICAR Take 1 tablet (20 mg total) by mouth daily.   Oxycodone HCl 10 MG Tabs Take 1 tablet (10 mg total) by mouth 2 (two) times daily.   traZODone 50 MG tablet Commonly known as:  DESYREL TAKE 1 TABLET AT BEDTIME ASNEEDED FOR SLEEP What changed: See the new instructions.        Discharge Instructions/ Education: Discharge instructions given to patient with verbalized understanding. Discharge education completed with patient including: follow up instructions, medication list, discharge activities, and limitations if indicated. Patient able to verbalize understanding, all questions fully answered. Patient instructed to return to Emergency Department, call 911, or call MD for any changes in condition.  Patient escorted via wheelchair to lobby and discharged home via private automobile.

## 2023-06-09 NOTE — Discharge Summary (Signed)
Physician Discharge Summary   Patient: Richard Vaughn MRN: [id number] DOB: 1951-11-06  Admit date:     06/08/2023  Discharge date: 06/09/23  Discharge Physician: Clydie Braun   PCP: [place], [name], MD   Recommendations at discharge:   Follow-up with PCP in the outpatient setting in 2 weeks regarding hospitalization  Discharge Diagnoses: Principal Problem:   Altered awareness, transient Active Problems:   History of CVA with residual deficit   History of intracranial hemorrhage   Essential hypertension   Chronic pain syndrome   Hyperlipidemia with target LDL less than 70   Marijuana use   GERD  Resolved Problems:   * No resolved hospital problems. *  Hospital Course: Richard Vaughn is a 72 y.o. male with medical history significant of hypertension, CVA with residual deficits, Barrett's esophagus, GERD, prior tobacco abuse, and OSA who presented with confusion starting yesterday afternoon.  History is obtained with the assistance of his wife.  Apparently yesterday afternoon they had been on their way to [employer] [facility] when the patient complained of feeling off.  Normally patient is alert and oriented and does not have any memory issues.  Home as a result of prior strokes he has some aphasia, left-sided weakness, and balance issues.  Normally he gets around intermittently using his wife guidance or a cane.  She has some Ditty still want to go to the gym to workout and he said yes.  However, getting into the car reported that he felt he was sitting but without sitting.  Thereafter felt like he was standing without standing.  After getting into the gym he did not remember where they were, why they were there, how to use machines that they regularly use, or people that he routinely sees.  Wife notes that symptoms lasted approximately 1 to 2 hours prior to resolving.  However, patient did not have any recollection of the episode.  Patient intermittently smokes marijuana to rest at  night.  This is not something new for which they feel would cause his symptoms.   In the emergency department patient was noted to be afebrile with pulse 55-78, respiration 14-25, blood pressures elevated up to 179/135, and O2 saturations maintained on room air.  CT scan of the head was negative for any acute abnormalities and noted severe chronic microvascular ischemic disease with multiple remote lacunar infarcts about the left basal ganglia and right thalamus.  Labs were within normal limits including CBC, CMP, folate acid, vitamin B12, and TSH.   Alcohol level was undetectable, COVID-19 screening was negative.  Urinalysis showed no acute abnormality.  UDS was positive for marijuana.  Assessment and Plan: Transient episode of amnesia Patient presents after being noted to be acutely altered where he could not remember where he was, why he was there, or how to use certain machines which was unusual.  Symptoms lasted about 1 to 2 hours, but patient has no recollection of the event.  CT scan of the brain did not note any acute abnormality.  WBC, CMP TSH, vitamin B12, alcohol level, and urinalysis unremarkable.  UDS positive for marijuana, but usually only does that at night to sleep.  MRI of the brain was negative.  EEG did not note any signs for seizure activity.  Neurology has been consulted and thought symptoms were likely secondary to a transient episode of amnesia   History of CVA with residual deficit History of ICH Patient with prior history of left basal ganglia strokes and intracranial hemorrhage  of the right basal ganglia hemorrhage noted to be nontraumatic with residual dysarthria and left-sided weakness.  -Continue aspirin and resume statin   Essential hypertension Blood pressure regimen includes amlodipine 2.5 mg nightly and olmesartan 20 mg daily. -Resumed home blood pressure medication regimen   Chronic pain Patient has chronic back pain.  PMDP reviewed patient last received oxycodone  10 mg 60 tablets on 6/26 -Continue oxycodone twice daily as needed for pain   Hyperlipidemia Patient previously had been on lovastatin 20 mg daily, but stopped taking it sometime ago.  Denied having any recent which he stopped the medication. -Check lipid panel -Restart pharmacy substitution for lovastatin   Marijuana use UDS positive for marijuana.   GERD Barrett's esophagus -Continue pharmacy substitution of Protonix twice daily         Pain control - [place] Controlled Substance Reporting System database was reviewed. and patient was instructed, not to drive, operate heavy machinery, perform activities at heights, swimming or participation in water activities or provide baby-sitting services while on Pain, Sleep and Anxiety Medications; until their outpatient Physician has advised to do so again. Also recommended to not to take more than prescribed Pain, Sleep and Anxiety Medications.  Consultants: Neurology Procedures performed: None Disposition: Home Diet recommendation:  Discharge Diet Orders (From admission, onward)     Start     Ordered   06/09/23 0000  Diet - low sodium heart healthy        06/09/23 1643            DISCHARGE MEDICATION: Allergies as of 06/09/2023   No Known Allergies      Medication List     TAKE these medications    amLODipine 2.5 MG tablet Commonly known as: NORVASC Take 1 tablet (2.5 mg total) by mouth at bedtime.   aspirin EC 81 MG tablet Take 1 tablet (81 mg total) by mouth daily. Swallow whole.   docusate sodium 100 MG capsule Commonly known as: COLACE Take 100 mg by mouth daily.   DULoxetine 60 MG capsule Commonly known as: CYMBALTA Take 1 capsule (60 mg total) by mouth daily.   esomeprazole 40 MG capsule Commonly known as: NEXIUM Take 1 capsule (40 mg total) by mouth 2 (two) times daily before a meal.   lovastatin 20 MG tablet Commonly known as: MEVACOR Take 1 tablet (20 mg total) by mouth at bedtime.    Melatonin 10 MG Tabs Take 1 tablet by mouth at bedtime.   olmesartan 20 MG tablet Commonly known as: BENICAR Take 1 tablet (20 mg total) by mouth daily.   Oxycodone HCl 10 MG Tabs Take 1 tablet (10 mg total) by mouth 2 (two) times daily.   traZODone 50 MG tablet Commonly known as: DESYREL TAKE 1 TABLET AT BEDTIME ASNEEDED FOR SLEEP What changed: See the new instructions.        Follow-up Information     [place], [name], MD. Schedule an appointment as soon as possible for a visit in 2 week(s).   Specialty: Family Medicine Why: Please call your primary care provider and set up a follow-up appointment in regards to recent discharge from the hospital Contact information: [address] Christena Flake [facility] [place] [address] [phone]                Discharge Exam: Filed Weights   06/09/23 0500 06/09/23 1544  Weight: 104.5 kg 104.5 kg    Elderly male currently in no acute distress standing at bedside Mild left-sided weakness noted.  Alert oriented x 3  Condition at discharge: stable  The results of significant diagnostics from this hospitalization (including imaging, microbiology, ancillary and laboratory) are listed below for reference.   Imaging Studies: EEG adult  Result Date: 06-12-2023 Richard Quest, MD     06-12-2023  4:29 PM Patient Name: Richard Vaughn MRN: [id number] Epilepsy Attending: Charlsie Vaughn Referring Physician/Provider: Clydie Braun, MD Date: 2023-06-12 Duration: 23.32 mins Patient history: 72yo M with transient alteration of awareness  getting eeg to evaluate for seizure. Level of alertness: Awake, drowsy AEDs during EEG study: None Technical aspects: This EEG study was done with scalp electrodes positioned according to the 10-20 International system of electrode placement. Electrical activity was reviewed with band pass filter of 1-70Hz , sensitivity of 7 uV/mm, display speed of 67mm/sec with a 60Hz  notched filter applied as appropriate. EEG data  were recorded continuously and digitally stored.  Video monitoring was available and reviewed as appropriate. Description: The posterior dominant rhythm consists of 10 Hz activity of moderate voltage (25-35 uV) seen predominantly in posterior head regions, symmetric and reactive to eye opening and eye closing. Drowsiness was characterized by attenuation of the posterior background rhythm. Hyperventilation and photic stimulation were not performed.   IMPRESSION: This study is within normal limits. No seizures or epileptiform discharges were seen throughout the recording. A normal interictal EEG does not exclude the diagnosis of epilepsy. Richard Vaughn   MR BRAIN WO CONTRAST  Result Date: 06-12-23 CLINICAL DATA:  Provided history: TIA. EXAM: MRI HEAD WITHOUT CONTRAST TECHNIQUE: Multiplanar, multiecho pulse sequences of the brain and surrounding structures were obtained without intravenous contrast. COMPARISON:  Head CT 06/08/2023.  Brain MRI 11/01/2022. FINDINGS: Brain: No age advanced or lobar predominant parenchymal atrophy. Multifocal T2 FLAIR hyperintense signal abnormality within the cerebral white matter and pons, nonspecific but compatible with advanced chronic small vessel ischemic disease. Redemonstrated chronic lacunar infarcts within the left corona radiata and bilateral deep gray nuclei. Chronic hemosiderin deposition at site of some of these infarcts. Chronic microhemorrhages elsewhere within the bilateral deep gray nuclei. Punctate chronic microhemorrhage within the posterior left temporal lobe. Cavum septum pellucidum and cavum vergae (common anatomic variant). There is no acute infarct. No evidence of an intracranial mass. No extra-axial fluid collection. No midline shift. Vascular: Maintained flow voids within the proximal large arterial vessels. Skull and upper cervical spine: No focal suspicious marrow lesion. Sinuses/Orbits: No mass or acute finding within the imaged orbits. No significant  paranasal sinus disease. Other: Large left mastoid effusion. IMPRESSION: 1. No evidence of an acute intracranial abnormality. 2. Advanced chronic small vessel ischemic changes within the cerebral white matter and pons, similar to the prior brain MRI of 11/01/2022. 3. Redemonstrated chronic lacunar infarcts within the left corona radiata and bilateral deep gray nuclei (some with associated chronic hemorrhage). 4. 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 5. Large left mastoid effusion. Electronically Signed   By: Jackey Loge D.O.   On: 06-12-23 11:39   CT HEAD CODE STROKE WO CONTRAST  Result Date: 06/08/2023 CLINICAL DATA:  Code stroke. Initial evaluation for neuro deficit, stroke. EXAM: CT HEAD WITHOUT CONTRAST TECHNIQUE: Contiguous axial images were obtained from the base of the skull through the vertex without intravenous contrast. RADIATION DOSE REDUCTION: This exam was performed according to the departmental dose-optimization program which includes automated exposure control, adjustment of the mA and/or kV according to patient size and/or use of iterative reconstruction technique.  COMPARISON:  Prior CT from 06/04/2020. FINDINGS: Brain: Generalized age-related cerebral atrophy. Patchy and confluent T2/FLAIR hyperintensity involving the supratentorial cerebral white matter, severe in nature. Multiple remote lacunar infarcts present about the left basal ganglia. Additional chronic right thalamic infarct. No acute intracranial hemorrhage. No visible acute large vessel territory infarct. No mass lesion or midline shift. No hydrocephalus or extra-axial fluid collection. Vascular: No abnormal hyperdense vessel. Scattered vascular calcifications noted within the carotid siphons. Skull: Scalp soft tissues demonstrate no acute finding. Calvarium intact. Sinuses/Orbits: Globes and orbital soft tissues within normal  limits. Visualized paranasal sinuses are clear. Trace left mastoid effusion, of doubtful significance. Other: None. ASPECTS [facility] Stroke Program Early CT Score) - Ganglionic level infarction (caudate, lentiform nuclei, internal capsule, insula, M1-M3 cortex): 7 - Supraganglionic infarction (M4-M6 cortex): 3 Total score (0-10 with 10 being normal): 10 IMPRESSION: 1. No acute intracranial abnormality. 2. ASPECTS is 10. 3. Severe chronic microvascular ischemic disease, with multiple remote lacunar infarcts about the left basal ganglia and right thalamus. Results were called by telephone at the time of interpretation on 06/08/2023 at 8:45 pm to provider Tanda Rockers , who verbally acknowledged these results. Electronically Signed   By: Rise Mu M.D.   On: 06/08/2023 20:50    Microbiology: Results for orders placed or performed during the hospital encounter of 06/08/23  SARS Coronavirus 2 by RT PCR (hospital order, performed in [facility] hospital lab) *cepheid single result test* Anterior Nasal Swab     Status: None   Collection Time: 06/09/23  2:34 AM   Specimen: Anterior Nasal Swab  Result Value Ref Range Status   SARS Coronavirus 2 by RT PCR NEGATIVE NEGATIVE Final    Comment: Performed at [facility], [address] [place], [place] [address]    Labs: CBC: Recent Labs  Lab 06/08/23 2029 06/09/23 0312  WBC 6.1 6.2  NEUTROABS 3.3 3.4  HGB 14.7 13.9  HCT 43.5 40.6  MCV 92.8 90.2  PLT 190 184   Basic Metabolic Panel: Recent Labs  Lab 06/08/23 2029 06/09/23 0312  NA 140 138  K 3.9 3.7  CL 103 103  CO2 29 27  GLUCOSE 81 98  BUN 11 7*  CREATININE 0.98 0.89  CALCIUM 9.7 9.2  MG  --  2.0   Liver Function Tests: Recent Labs  Lab 06/08/23 2029 06/09/23 0312  AST 16 18  ALT 19 22  ALKPHOS 77 73  BILITOT 0.5 0.9  PROT 7.7 6.9  ALBUMIN 4.6 3.9   CBG: Recent Labs  Lab 06/08/23 2025 06/09/23 1130  GLUCAP 72 175*    Discharge time spent: less than 30  minutes.  Signed: Clydie Braun, MD [facility] 06/09/2023

## 2023-06-09 NOTE — Plan of Care (Signed)
Patient discharging home.   Problem: Education: Goal: Knowledge of General Education information will improve Description: Including pain rating scale, medication(s)/side effects and non-pharmacologic comfort measures Outcome: Adequate for Discharge   Problem: Health Behavior/Discharge Planning: Goal: Ability to manage health-related needs will improve Outcome: Adequate for Discharge   Problem: Clinical Measurements: Goal: Ability to maintain clinical measurements within normal limits will improve Outcome: Adequate for Discharge Goal: Will remain free from infection Outcome: Adequate for Discharge Goal: Diagnostic test results will improve Outcome: Adequate for Discharge Goal: Respiratory complications will improve Outcome: Adequate for Discharge Goal: Cardiovascular complication will be avoided Outcome: Adequate for Discharge   Problem: Activity: Goal: Risk for activity intolerance will decrease Outcome: Adequate for Discharge   Problem: Nutrition: Goal: Adequate nutrition will be maintained Outcome: Adequate for Discharge   Problem: Coping: Goal: Level of anxiety will decrease Outcome: Adequate for Discharge   Problem: Elimination: Goal: Will not experience complications related to bowel motility Outcome: Adequate for Discharge Goal: Will not experience complications related to urinary retention Outcome: Adequate for Discharge   Problem: Pain Managment: Goal: General experience of comfort will improve Outcome: Adequate for Discharge   Problem: Safety: Goal: Ability to remain free from injury will improve Outcome: Adequate for Discharge   Problem: Skin Integrity: Goal: Risk for impaired skin integrity will decrease Outcome: Adequate for Discharge

## 2023-06-09 NOTE — Plan of Care (Signed)

## 2023-06-09 NOTE — Progress Notes (Signed)
Richard Vaughn is pended to go to MRI, PRN ativan given at this time for claustrophobia. Held AM oxycodone dose due to risk for decreased respirations. Educated patient and agreeable to give dose after MRI.

## 2023-06-09 NOTE — Progress Notes (Signed)
Discharge instructions given. Patient verbalized understanding and all questions were answered.  ?

## 2023-06-09 NOTE — Progress Notes (Signed)
Carryover admission to the Day Admitter; accepted by Dr.  Joneen Roach as transfer from  [facility]  to a  med-tele bed at  [facility]  for further evaluation management of altered mental status. Please see Dr.  Lajoyce Lauber transfer documentation  for additional details.   MRI brain pending.  I have placed some additional preliminary admit orders via the adult multi-morbid admission order set. I have also ordered NPO for now, pending my order for RN swallow screen.  Of also ordered morning labs in the form of CMP, CBC, serum magnesium level.    Richard Pigg, DO Hospitalist

## 2023-06-09 NOTE — Consult Note (Addendum)
Neurology Consultation Reason for Consult: ams Referring Physician: Dr Madelyn Flavors  CC: ams  History is obtained from: Patient, chart review  HPI: Richard Vaughn is a 72 y.o. male with past medical history of hypertension, hyperlipidemia, diabetes, lacunar strokes as well as hypertensive bleeds in bilateral deep gray matter with residual left-sided weakness, cognitive impairment, chronic balance issues who was brought in for alteration of awareness. Patient doesn't remember the episode and states he feels back to baseline. Wife at bedside agrees.  Wife states yesterday around 730 pm, patient states he was not feeling well. States he was sitting but didn't feel like he was sitting. They were on their way to gym and while at gym he couldn't remember where he was and repeatedly asked where he was, how to use the machines. Wife eventually took him down to the ER at [facility] and he was  transferred to [facility]. BP was within normal limits, normal blood glucose, normal electrolytes, normal UA, UDS only positive for THC. No recent illness. Episode lasted for about 4 hours and then completely resolved. No similar episodes in past, no jerking, gaze deviation, weakness, seizure like activity.   ROS: All other systems reviewed and negative except as noted in the HPI.   Past Medical History:  Diagnosis Date   Barrett's esophagus    Chronic back pain    Depression    Gastritis    Mild   Gastroparesis    GERD (gastroesophageal reflux disease)    Headache    History of kidney stones    Hyperglycemia    Hypertension    Nephrolithiasis    Pneumonia    Covid pneumonia   Renal cyst    Sleep apnea    Stricture and stenosis of esophagus    Stroke (HCC) 07/2015   Vision abnormalities     Family History  Problem Relation Age of Onset   Hypertension Father    Stroke Father    Hypertension Mother    Liver cancer Mother    Hypertension Sister        2 other sisters has also   Stroke Sister     Stroke Sister    Hypertension Brother        2nd brother has also   Colon cancer Neg Hx    Esophageal cancer Neg Hx    Rectal cancer Neg Hx    Stomach cancer Neg Hx    Colon polyps Neg Hx     Social History:  reports that he quit smoking about 5 years ago. His smoking use included cigarettes. He started smoking about 35 years ago. He has a 15 pack-year smoking history. He has never used smokeless tobacco. He reports that he does not drink alcohol and does not use drugs.  Medications Prior to Admission  Medication Sig Dispense Refill Last Dose   amLODipine (NORVASC) 2.5 MG tablet Take 1 tablet (2.5 mg total) by mouth at bedtime. 90 tablet 2 06/08/2023   aspirin EC 81 MG tablet Take 1 tablet (81 mg total) by mouth daily. Swallow whole. 30 tablet 11 06/08/2023   docusate sodium (COLACE) 100 MG capsule Take 100 mg by mouth daily.   Past Week   DULoxetine (CYMBALTA) 60 MG capsule Take 1 capsule (60 mg total) by mouth daily. 90 capsule 1 06/08/2023   esomeprazole (NEXIUM) 40 MG capsule Take 1 capsule (40 mg total) by mouth 2 (two) times daily before a meal. 180 capsule 1 06/08/2023   Melatonin 10 MG  TABS Take 1 tablet by mouth at bedtime.   Past Week   olmesartan (BENICAR) 20 MG tablet Take 1 tablet (20 mg total) by mouth daily. 90 tablet 3 06/08/2023   Oxycodone HCl 10 MG TABS Take 1 tablet (10 mg total) by mouth 2 (two) times daily. 60 tablet 0 06/08/2023   traZODone (DESYREL) 50 MG tablet TAKE 1 TABLET AT BEDTIME ASNEEDED FOR SLEEP (Patient taking differently: Take 50 mg by mouth at bedtime as needed for sleep.) 90 tablet 2 Past Week      Exam: Current vital signs: BP 111/76 (BP Location: Left Arm)   Pulse 70   Temp 98.4 F (36.9 C) (Oral)   Resp 18   Wt 104.5 kg   SpO2 94%   BMI 29.58 kg/m  Vital signs in last 24 hours: Temp:  [98 F (36.7 C)-98.4 F (36.9 C)] 98.4 F (36.9 C) (07/25 1131) Pulse Rate:  [55-78] 70 (07/25 1131) Resp:  [14-25] 18 (07/25 1131) BP:  (111-179)/(76-135) 111/76 (07/25 1131) SpO2:  [91 %-100 %] 94 % (07/25 1131) Weight:  [104.5 kg] 104.5 kg (07/25 0500)   Physical Exam  Constitutional: Appears well-developed and well-nourished.  Psych: Affect appropriate to situation Eyes: No scleral injection Neuro: AOx3, no aphasia, mild to moderate dysarthria, CN 2-12 appear grossly intact, 5/5 in all extremities with left arm proximal weakness, FTN intact BL, sensory intact to light touch   I have reviewed labs in epic and the results pertinent to this consultation are: CBC:  Recent Labs  Lab 06/08/23 2029 06/09/23 0312  WBC 6.1 6.2  NEUTROABS 3.3 3.4  HGB 14.7 13.9  HCT 43.5 40.6  MCV 92.8 90.2  PLT 190 184    Basic Metabolic Panel:  Lab Results  Component Value Date   NA 138 06/09/2023   K 3.7 06/09/2023   CO2 27 06/09/2023   GLUCOSE 98 06/09/2023   BUN 7 (L) 06/09/2023   CREATININE 0.89 06/09/2023   CALCIUM 9.2 06/09/2023   GFRNONAA >60 06/09/2023   GFRAA >60 08/07/2020   Lipid Panel:  Lab Results  Component Value Date   LDLCALC 94 12/09/2021   HgbA1c:  Lab Results  Component Value Date   HGBA1C 5.4 12/09/2020   Urine Drug Screen:     Component Value Date/Time   LABOPIA NONE DETECTED 06/08/2023 2144   COCAINSCRNUR NONE DETECTED 06/08/2023 2144   LABBENZ NONE DETECTED 06/08/2023 2144   AMPHETMU NONE DETECTED 06/08/2023 2144   THCU POSITIVE (A) 06/08/2023 2144   LABBARB NONE DETECTED 06/08/2023 2144    Alcohol Level     Component Value Date/Time   ETH <10 06/08/2023 2029     I have reviewed the images obtained:  MRI Brain without contrast 06/09/2023:  No evidence of an acute intracranial abnormality. Advanced chronic small vessel ischemic changes within the cerebral white matter and pons, similar to the prior brain MRI of 11/01/2022. Redemonstrated chronic lacunar infarcts within the left corona radiata and bilateral deep gray nuclei (some with associated chronichemorrhage). 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     ASSESSMENT/PLAN: 72yo M with transient alteration of awareness for about 4 hours with complete return to baseline.   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 Patient reportedly;y has cognitive impairment but episode doesnt sound typical for sundowning. No HTN, infection, toxic-metabolic abnormalities. Most likely TGA vs less likely due to [facility]  Recommendations: - will  get EEG but will not start ASMs unless epileptiform activity noted on EEG - continue ASA 81mg  daily - On pravastatin, can potentially be switched to atorva 40mg  dail. Will defer to outpatient neuro - ok to discharge from neuro standpoint if eeg normal and f/u with [facility] in 1-2 months - Plan discussed with wife and Dr Katrinka Blazing  Thank you for allowing [place] to participate in the care of this patient. If you have any further questions, please contact  me or neurohospitalist.   I have spent a total of  82  minutes with the patient reviewing hospital notes,  test results, labs and examining the patient as well as establishing an assessment and plan that was discussed personally with the patient.  > 50% of time was spent in direct patient care.       Lindie Spruce Epilepsy [facility]

## 2023-06-09 NOTE — Hospital Course (Addendum)
This is a 72 year old male with past medical history of prior CVA with residual deficits including persistent fa droop, dysarthria, right-sided hemiparesis. Wife assists patient push to standing position, hemiplegic gait, unable to tandem walk. Uses cane at baseline.  Earlier today around 7:30 PM, patient at the gym with wife noted he became acutely confused.  He was brought to [facility] code stroke.  In the ER he remains confused.  On examination he has worsening facial droop for hemiparesis and dysarthria.  However, given these are the same deficits as prior she will still worsen it is unclear if this is recrudescence, new CVA, seizures.  Teleneuro seen patient, recommends admission with stroke workup including MRI or neurochecks. UA negative, WBC 6.1, temperature [to be ordered].  Will check COVID.  Ruling out infection.  Blood pressure normotensive on presentation.  EKG NSR.  Patient accepted for admission.TSH, VIT B12 level ordered. No h/o liver disease

## 2023-06-09 NOTE — TOC Initial Note (Signed)
Transition of Care [facility]) - Initial/Assessment Note    Patient Details  Name: Richard Vaughn MRN: [id number] Date of Birth: [birth year]  Transition of Care [facility]) CM/SW Contact:    Kermit Balo, RN Phone Number: 06/09/2023, 1:45 PM  Clinical Narrative:                  Pt is from home with his spouse. They are together most of the time.  Pt manages his own medications at home and he denies any issues. Pt's spouse provides needed transportation. Awaiting therapy evals.  TOC following.   Expected Discharge Plan: Home/Self Care Barriers to Discharge: Continued Medical Work up   Patient Goals and CMS Choice            Expected Discharge Plan and Services   Discharge Planning Services: CM Consult   Living arrangements for the past 2 months: Single Family Home                                      Prior Living Arrangements/Services Living arrangements for the past 2 months: Single Family Home Lives with:: Spouse Patient language and need for interpreter reviewed:: Yes Do you feel safe going back to the place where you live?: Yes        Care giver support system in place?: Yes (comment) Current home services: DME (cane/ walker/ shower seat) Criminal Activity/Legal Involvement Pertinent to Current Situation/Hospitalization: No - Comment as needed  Activities of Daily Living Home Assistive Devices/Equipment: Cane (specify quad or straight), Walker (specify type), Wheelchair, Dentures (specify type) ADL Screening (condition at time of admission) Patient's cognitive ability adequate to safely complete daily activities?: Yes Is the patient deaf or have difficulty hearing?: No Does the patient have difficulty seeing, even when wearing glasses/contacts?: No Does the patient have difficulty concentrating, remembering, or making decisions?: No Patient able to express need for assistance with ADLs?: Yes Does the patient have difficulty dressing or bathing?:  Yes Independently performs ADLs?: No Communication: Independent Dressing (OT): Needs assistance Grooming: Independent, Needs assistance Is this a change from baseline?: Pre-admission baseline Feeding: Independent Bathing: Needs assistance Is this a change from baseline?: Pre-admission baseline Toileting: Needs assistance Is this a change from baseline?: Pre-admission baseline In/Out Bed: Needs assistance Is this a change from baseline?: Pre-admission baseline Walks in Home: Needs assistance Is this a change from baseline?: Pre-admission baseline Does the patient have difficulty walking or climbing stairs?: Yes Weakness of Legs: Left Weakness of Arms/Hands: Left  Permission Sought/Granted                  Emotional Assessment Appearance:: Appears stated age   Affect (typically observed): Quiet Orientation: : Oriented to Self, Oriented to Place, Oriented to  Time, Oriented to Situation   Psych Involvement: No (comment)  Admission diagnosis:  TIA (transient ischemic attack) [G45.9] Stroke-like symptoms [R29.90] Altered mental status, unspecified altered mental status type [R41.82] Patient Active Problem List   Diagnosis Date Noted   Altered awareness, transient 06/09/2023   Marijuana use 06/09/2023   History of intracranial hemorrhage 06/09/2023   Drug-induced constipation 06/08/2022   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) 12/09/2021   Depression, major, recurrent, mild (HCC)    Sleep disturbance    Urinary retention    Bradycardia    Essential hypertension    Temperature elevated    Left thyroid nodule  Treatment-emergent central sleep apnea 10/25/2018   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) 10/25/2018   Chronic pain syndrome    Chronic bilateral low back pain    Benign essential HTN    Hyperlipidemia    History of CVA with residual deficit    Dysphagia, post-stroke    ICH (intracerebral  hemorrhage) (HCC) - R thalmic/PLIC d/t HTN 84/69/6295   Insomnia 07/13/2017   PAD (peripheral artery disease) (HCC) 07/13/2017   Hyperlipidemia with target LDL less than 70 07/13/2017   Stroke-like symptom 09/22/2016   Paresthesia 09/22/2016   CVA (cerebral vascular accident) (HCC) 09/22/2016   Cerebrovascular accident (CVA) due to thrombosis of left carotid artery (HCC) 09/23/2015   Primary snoring 09/23/2015   Hypersomnia with sleep apnea 09/23/2015   Obstructive sleep apnea 09/23/2015   Lacunar infarct, acute (HCC) 08/25/2015   Sleep apnea 08/25/2015   Dysarthria    CVD (cardiovascular disease) 08/02/2015   Acute hyperglycemia 08/02/2015   Systolic hypertension with cerebrovascular disease 12/27/2014   Epistaxis 07/28/2012   NEPHROLITHIASIS 05/23/2009   BARRETTS ESOPHAGUS 02/20/2009   Gastroparesis 02/20/2009   RADICULOPATHY 10/21/2008   GERD 10/08/2008   Esophagitis 08/15/2008   ESOPHAGEAL STRICTURE 08/15/2008   GASTRITIS, ACUTE 08/15/2008   RENAL CYST 08/14/2008   HEMATURIA, MICROSCOPIC, HX OF 08/08/2008   PULMONARY NODULE 01/10/2008   PCP:  [place], [name], MD Pharmacy:   [facility] - [place], [place] - [address] [facility] AT [facility] [address] [address] Phone: [phone] Fax: [phone]  [id number] [place], [place] - [address] AT [address] OF [facility] [address] [place] [address] Phone: [phone] Fax: [phone]     Social Determinants of Health (SDOH) Social History: SDOH Screenings   Food Insecurity: No Food Insecurity (06/09/2023)  Housing: Low Risk  (06/09/2023)  Transportation Needs: No Transportation Needs (06/09/2023)  Utilities: Not At Risk (06/09/2023)  Alcohol Screen: Low Risk  (04/25/2023)  Depression (PHQ2-9): Low Risk  (04/25/2023)  Financial Resource Strain: Low Risk  (04/25/2023)  Physical Activity: Sufficiently Active  (04/25/2023)  Social Connections: Moderately Isolated (04/25/2023)  Stress: No Stress Concern Present (04/25/2023)  Tobacco Use: Medium Risk (06/08/2023)   SDOH Interventions:     Readmission Risk Interventions     No data to display

## 2023-06-09 NOTE — Progress Notes (Signed)
EEG complete - results pending 

## 2023-06-09 NOTE — H&P (Addendum)
History and Physical    Patient: Richard Vaughn UEA:[id number] DOB: [birth year] DOA: 06/08/2023 DOS: the patient was seen and examined on 06/09/2023 PCP: [place], [name], MD  Patient coming from: tR  Chief Complaint:  Chief Complaint  Patient presents with   Altered Mental Status   HPI: Richard Vaughn is a 72 y.o. male with medical history significant of hypertension, CVA with residual deficits, Barrett's esophagus, GERD, prior tobacco abuse, and OSA who presented with confusion starting yesterday afternoon.  History is obtained with the assistance of his wife.  Apparently yesterday afternoon they had been on their way to [employer] [facility] when the patient complained of feeling off.  Normally patient is alert and oriented and does not have any memory issues.  Home as a result of prior strokes he has some aphasia, left-sided weakness, and balance issues.  Normally he gets around intermittently using his wife guidance or a cane.  She has some Ditty still want to go to the gym to workout and he said yes.  However, getting into the car reported that he felt he was sitting but without sitting.  Thereafter felt like he was standing without standing.  After getting into the gym he did not remember where they were, why they were there, how to use machines that they regularly use, or people that he routinely sees.  Wife notes that symptoms lasted approximately 1 to 2 hours prior to resolving.  However, patient did not have any recollection of the episode.  Patient intermittently smokes marijuana to rest at night.  This is not something new for which they feel would cause his symptoms.  In the emergency department patient was noted to be afebrile with pulse 55-78, respiration 14-25, blood pressures elevated up to 179/135, and O2 saturations maintained on room air.  CT scan of the head was negative for any acute abnormalities and noted severe chronic microvascular ischemic disease with multiple remote  lacunar infarcts about the left basal ganglia and right thalamus.  Labs were within normal limits including CBC, CMP, folate acid, vitamin B12, and TSH.   Alcohol level was undetectable, COVID-19 screening was negative.  Urinalysis showed no acute abnormality.  UDS was positive for marijuana. Review of Systems: As mentioned in the history of present illness. All other systems reviewed and are negative. Past Medical History:  Diagnosis Date   Barrett's esophagus    Chronic back pain    Depression    Gastritis    Mild   Gastroparesis    GERD (gastroesophageal reflux disease)    Headache    History of kidney stones    Hyperglycemia    Hypertension    Nephrolithiasis    Pneumonia    Covid pneumonia   Renal cyst    Sleep apnea    Stricture and stenosis of esophagus    Stroke (HCC) 07/2015   Vision abnormalities    Past Surgical History:  Procedure Laterality Date   CERVICAL DISC SURGERY  2012   COLONOSCOPY     LUMBAR DISC SURGERY  2005, 2010   replaced L4 and L5   SHOULDER ARTHROSCOPY WITH ROTATOR CUFF REPAIR Left 08/14/2020   Procedure: SHOULDER ARTHROSCOPY WITH ROTATOR CUFF REPAIR, SUBACROMIAL DECOMPRESSION;  Surgeon: Jones Broom, MD;  Location: [facility];  Service: Orthopedics;  Laterality: Left;   UPPER GASTROINTESTINAL ENDOSCOPY     Social History:  reports that he quit smoking about 5 years ago. His smoking use included cigarettes. He started smoking about 35 years  ago. He has a 15 pack-year smoking history. He has never used smokeless tobacco. He reports that he does not drink alcohol and does not use drugs.  No Known Allergies  Family History  Problem Relation Age of Onset   Hypertension Father    Stroke Father    Hypertension Mother    Liver cancer Mother    Hypertension Sister        2 other sisters has also   Stroke Sister    Stroke Sister    Hypertension Brother        2nd brother has also   Colon cancer Neg Hx    Esophageal cancer Neg Hx    Rectal cancer  Neg Hx    Stomach cancer Neg Hx    Colon polyps Neg Hx     Prior to Admission medications   Medication Sig Start Date End Date Taking? Authorizing Provider  amLODipine (NORVASC) 2.5 MG tablet Take 1 tablet (2.5 mg total) by mouth at bedtime. 06/08/22  Yes [place], [name], MD  aspirin EC 81 MG tablet Take 1 tablet (81 mg total) by mouth daily. Swallow whole. 06/10/20  Yes McCue, [name], NP  docusate sodium (COLACE) 100 MG capsule Take 100 mg by mouth daily.   Yes [provider]  DULoxetine (CYMBALTA) 60 MG capsule Take 1 capsule (60 mg total) by mouth daily. 01/24/23  Yes [place], [name], MD  esomeprazole (NEXIUM) 40 MG capsule Take 1 capsule (40 mg total) by mouth 2 (two) times daily before a meal. 10/20/22  Yes Doree Albee, PA-C  Melatonin 10 MG TABS Take 1 tablet by mouth at bedtime.   Yes [provider]  olmesartan (BENICAR) 20 MG tablet Take 1 tablet (20 mg total) by mouth daily. 03/02/23  Yes [place], [name], MD  Oxycodone HCl 10 MG TABS Take 1 tablet (10 mg total) by mouth 2 (two) times daily. 05/03/23  Yes [place], [name], MD  traZODone (DESYREL) 50 MG tablet TAKE 1 TABLET AT BEDTIME ASNEEDED FOR SLEEP Patient taking differently: Take 50 mg by mouth at bedtime as needed for sleep. 03/07/23  Yes [place], [name], MD    Physical Exam: Vitals:   06/09/23 0045 06/09/23 0125 06/09/23 0234 06/09/23 0500  BP:  123/89 (!) 179/135   Pulse: 64 63 63   Resp:   18   Temp:   98.4 F (36.9 C)   TempSrc:   Oral   SpO2: 97% 99% 100%   Weight:    104.5 kg   Constitutional: Elderly male currently in no acute distress Eyes: PERRL, lids and conjunctivae normal ENMT: Mucous membranes are moist.   Left-sided facial droop Neck: normal, supple  Respiratory: clear to auscultation bilaterally, no wheezing, no crackles. Normal respiratory effort. No accessory muscle use.  Cardiovascular: Regular rate and rhythm, no murmurs / rubs / gallops. No extremity edema.  Abdomen: no  tenderness, no masses palpated.  Bowel sounds positive.  Musculoskeletal: no clubbing / cyanosis. No joint deformity upper and lower extremities. Good ROM, no contractures. Normal muscle tone.  Skin: no rashes, lesions, ulcers. No induration Neurologic: CN 2-12 grossly intact.  Left-sided weakness present with dysarthria Psychiatric: Normal judgment and insight. Alert and oriented x 3. Normal mood.   Data Reviewed:  EKG revealed normal sinus rhythm at 69 bpm.  Reviewed labs, imaging, and pertinent records as documented  Assessment and Plan:  Transient altered awareness Patient presents after being noted to be acutely altered where he could  not remember where he was, why he was there, or how to use certain machines which was unusual.  Symptoms lasted about 1 to 2 hours, but patient has no recollection of the event.  CT scan of the brain did not note any acute abnormality.  WBC, CMP TSH, vitamin B12, alcohol level, and urinalysis unremarkable.  UDS positive for marijuana, but usually only does that at night to sleep.  Question the possibility of TIA/stroke, transient episode of amnesia, other. -Admit to a telemetry bed -Neurochecks -Check EEG -Check MRI of the brain -PT/OT to eval and treat -Consulted, we will follow-up for any further recommendations  History of CVA with residual deficit History of ICH Patient with prior history of left basal ganglia strokes and intracranial hemorrhage of the right basal ganglia hemorrhage noted to be nontraumatic with residual dysarthria and left-sided weakness.  -Continue aspirin and resume statin  Essential hypertension Blood pressure regimen includes amlodipine 2.5 mg nightly and olmesartan 20 mg daily. -Allow for permissive hypertension at this time only treating systolic blood pressures greater than 220 or diastolic blood pressures greater than 120.   -Will resume home blood pressure medications if no signs of stroke  Chronic pain Patient has  chronic back pain.  PMDP reviewed patient last received oxycodone 10 mg 60 tablets on 6/26 -Continue oxycodone twice daily as needed for pain  Hyperlipidemia Patient previously had been on lovastatin 20 mg daily, but stopped taking it sometime ago.  Denied having any recent which he stopped the medication. -Check lipid panel -Restart pharmacy substitution for lovastatin  Marijuana use UDS positive for marijuana.  GERD Barrett's esophagus -Continue pharmacy substitution of Protonix twice daily  DVT prophylaxis: Lovenox Advance Care Planning:   Code Status: Full Code   Consults: Neurology  Family Communication: Wife updated at bedside  Severity of Illness: The appropriate patient status for this patient is OBSERVATION. Observation status is judged to be reasonable and necessary in order to provide the required intensity of service to ensure the patient's safety. The patient's presenting symptoms, physical exam findings, and initial radiographic and laboratory data in the context of their medical condition is felt to place them at decreased risk for further clinical deterioration. Furthermore, it is anticipated that the patient will be medically stable for discharge from the hospital within 2 midnights of admission.   Author: Clydie Braun, MD 06/09/2023 7:35 AM  For on call review [email or web address].

## 2023-06-09 NOTE — Procedures (Signed)
Patient Name: TAYSEAN WAGER  MRN: [id number]  Epilepsy Attending: Charlsie Quest  Referring Physician/Provider: Clydie Braun, MD  Date: 06/09/2023 Duration: 23.32 mins  Patient history: 72yo M with transient alteration of awareness  getting eeg to evaluate for seizure.  Level of alertness: Awake, drowsy  AEDs during EEG study: None  Technical aspects: This EEG study was done with scalp electrodes positioned according to the 10-20 International system of electrode placement. Electrical activity was reviewed with band pass filter of 1-70Hz , sensitivity of 7 uV/mm, display speed of 36mm/sec with a 60Hz  notched filter applied as appropriate. EEG data were recorded continuously and digitally stored.  Video monitoring was available and reviewed as appropriate.  Description: The posterior dominant rhythm consists of 10 Hz activity of moderate voltage (25-35 uV) seen predominantly in posterior head regions, symmetric and reactive to eye opening and eye closing. Drowsiness was characterized by attenuation of the posterior background rhythm. Hyperventilation and photic stimulation were not performed.     IMPRESSION: This study is within normal limits. No seizures or epileptiform discharges were seen throughout the recording.  A normal interictal EEG does not exclude the diagnosis of epilepsy.  Anjolie Majer Annabelle Harman

## 2023-06-13 ENCOUNTER — Other Ambulatory Visit: Payer: Self-pay | Admitting: Family Medicine

## 2023-06-13 DIAGNOSIS — G894 Chronic pain syndrome: Secondary | ICD-10-CM

## 2023-06-13 DIAGNOSIS — G8929 Other chronic pain: Secondary | ICD-10-CM

## 2023-06-13 NOTE — Telephone Encounter (Signed)
Prescription Request  06/13/2023  LOV: 02/02/2023  What is the name of the medication or equipment? Oxycodone HCl 10 MG TABS   Have you contacted your pharmacy to request a refill? No   Which pharmacy would you like this sent to?   [id number] [place], [place] - [address] AT [address] OF [facility] [address] [place] [address] Phone: [phone] Fax: [phone]    Patient notified that their request is being sent to the clinical staff for review and that they should receive a response within 2 business days.   Please advise at Mobile [phone] (mobile)

## 2023-06-14 MED ORDER — OXYCODONE HCL 10 MG PO TABS
10.0000 mg | ORAL_TABLET | Freq: Two times a day (BID) | ORAL | 0 refills | Status: DC
Start: 2023-06-14 — End: 2023-07-07

## 2023-06-15 ENCOUNTER — Encounter (INDEPENDENT_AMBULATORY_CARE_PROVIDER_SITE_OTHER): Payer: Self-pay

## 2023-06-23 ENCOUNTER — Ambulatory Visit: Payer: Medicare Other | Admitting: Podiatry

## 2023-07-01 ENCOUNTER — Other Ambulatory Visit: Payer: Self-pay | Admitting: Family Medicine

## 2023-07-01 DIAGNOSIS — I10 Essential (primary) hypertension: Secondary | ICD-10-CM

## 2023-07-01 NOTE — Progress Notes (Unsigned)
HPI: [name] is a 72 y.o. male, who is here today with his wife for chronic disease management. Last seen on 11/03/22 Since his last OV he was evaluated in the ED and hospitalized from 7/24 to 7/25. ***  Depression and generalized OA: He is currently taking Cymbalta 60 mg daily***  Insomnia: He is taking Trazodone 50 mg at bedtime to aid in sleep and has reported satisfactory sleep quality without residual drowsiness. Sleeping about 7-8 hours.***  Pain management continues with Oxycodone 10 mg bid prn, which remains effective without the onset of new side effects. He notes an improvement in previously reported constipation, with stool consistency now softer. He rates his pain level as a 5-6/10 while on medication.   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   Hypertension:  Medications:Amlodipine 2.5 mg daily and Olmesartan 20 mg daily. BP's 120's/70's when he checks BP at home. Negative for unusual or severe headache, visual changes, exertional chest pain, dyspnea, new focal weakness, or edema.  Lab Results  Component Value Date   CREATININE 0.89 06/09/2023   BUN 7 (L) 06/09/2023   NA 138 06/09/2023   K 3.7 06/09/2023   CL 103 06/09/2023   CO2 27 06/09/2023  His last eye examination was conducted one year ago.  Lab Results  Component Value Date   CHOL 164 06/09/2023   HDL 48 06/09/2023   LDLCALC 101 (H) 06/09/2023   TRIG 76 06/09/2023   CHOLHDL 3.4 06/09/2023    Review of Systems  Constitutional:  Positive for fatigue. Negative for chills and fever.  Respiratory:  Negative for choking and wheezing.   Gastrointestinal:  Negative for abdominal pain, nausea and vomiting.  Genitourinary:  Negative for decreased urine volume, dysuria and hematuria.  Skin:  Negative for rash.  Neurological:  Negative for syncope.  See other pertinent positives and negatives in HPI.  Current  Outpatient Medications on File Prior to Visit  Medication Sig Dispense Refill   amLODipine (NORVASC) 2.5 MG tablet TAKE 1 TABLET BY MOUTH AT BEDTIME. 90 tablet 2   aspirin EC 81 MG tablet Take 1 tablet (81 mg total) by mouth daily. Swallow whole. 30 tablet 11   docusate sodium (COLACE) 100 MG capsule Take 100 mg by mouth daily.     DULoxetine (CYMBALTA) 60 MG capsule Take 1 capsule (60 mg total) by mouth daily. 90 capsule 1   esomeprazole (NEXIUM) 40 MG capsule Take 1 capsule (40 mg total) by mouth 2 (two) times daily before a meal. 180 capsule 1   lovastatin (MEVACOR) 20 MG tablet Take 1 tablet (20 mg total) by mouth at bedtime. 30 tablet 0   Melatonin 10 MG TABS Take 1 tablet by mouth at bedtime.     olmesartan (BENICAR) 20 MG tablet Take 1 tablet (20 mg total) by mouth daily. 90 tablet 3   Oxycodone HCl 10 MG TABS Take 1 tablet (10 mg total) by mouth 2 (two) times daily. 60 tablet 0   traZODone (DESYREL) 50 MG tablet TAKE 1 TABLET AT BEDTIME ASNEEDED FOR SLEEP (Patient taking differently: Take 50 mg by mouth at bedtime as needed for sleep.) 90 tablet 2   No current facility-administered medications on file prior to visit.   Past Medical History:  Diagnosis Date   Barrett's esophagus    Chronic back pain    Depression    Gastritis    Mild   Gastroparesis  GERD (gastroesophageal reflux disease)    Headache    History of kidney stones    Hyperglycemia    Hypertension    Nephrolithiasis    Pneumonia    Covid pneumonia   Renal cyst    Sleep apnea    Stricture and stenosis of esophagus    Stroke (HCC) 07/2015   Vision abnormalities    No Known Allergies  Social History   Socioeconomic History   Marital status: Married    Spouse name: Richard Vaughn   Number of children: 3   Years of education: Not on file   Highest education level: Some college, no degree  Occupational History   Occupation: retired    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[employer]  Tobacco Use   Smoking status: Former     Current packs/day: 0.00    Average packs/day: 0.5 packs/day for 30.0 years (15.0 ttl pk-yrs)    Types: Cigarettes    Start date: 04/15/1988    Quit date: 04/15/2018    Years since quitting: 5.2   Smokeless tobacco: Never  Vaping Use   Vaping status: Never Used  Substance and Sexual Activity   Alcohol use: No    Alcohol/week: 0.0 standard drinks of alcohol   Drug use: Never    Comment: last use 04/2018   Sexual activity: Yes  Other Topics Concern   Not on file  Social History Narrative   Not on file   Social Determinants of Health   Financial Resource Strain: Low Risk  (04/25/2023)   Overall Financial Resource Strain (CARDIA)    Difficulty of Paying Living Expenses: Not hard at all  Food Insecurity: No Food Insecurity (07/05/2023)   Hunger Vital Sign    Worried About Running Out of Food in the Last Year: Never true    Ran Out of Food in the Last Year: Never true  Transportation Needs: No Transportation Needs (07/05/2023)   PRAPARE - Administrator, Civil Service (Medical): No    Lack of Transportation (Non-Medical): No  Physical Activity: Sufficiently Active (07/05/2023)   Exercise Vital Sign    Days of Exercise per Week: 5 days    Minutes of Exercise per Session: 60 min  Stress: No Stress Concern Present (07/05/2023)   [employer] of Occupational Health - Occupational Stress Questionnaire    Feeling of Stress : Only a little  Social Connections: Moderately Isolated (07/05/2023)   Social Connection and Isolation Panel [NHANES]    Frequency of Communication with Friends and Family: Once a week    Frequency of Social Gatherings with Friends and Family: More than three times a week    Attends Religious Services: Never    Database administrator or Organizations: No    Attends Banker Meetings: Never    Marital Status: Married   Vitals:   07/05/23 1551  BP: 110/78  Pulse: 99  Resp: 16  Temp: 98.5 F (36.9 C)  SpO2: 96%   Body mass index is 28.36  kg/m.  Physical Exam Vitals and nursing note reviewed.  Constitutional:      General: He is not in acute distress.    Appearance: He is well-developed.  HENT:     Head: Normocephalic and atraumatic.     Mouth/Throat:     Mouth: Mucous membranes are moist.  Eyes:     Conjunctiva/sclera: Conjunctivae normal.  Cardiovascular:     Rate and Rhythm: Normal rate and regular rhythm.     Heart sounds: No murmur  heard.    Comments: DP pulses palpable. Trace pitting LE edema, bilateral. Pulmonary:     Effort: Pulmonary effort is normal. No respiratory distress.     Breath sounds: Normal breath sounds.  Abdominal:     Palpations: Abdomen is soft. There is no mass.     Tenderness: There is no abdominal tenderness.  Skin:    General: Skin is warm.     Findings: No erythema or rash.  Neurological:     Mental Status: He is alert and oriented to person, place, and time.     Comments: Left-sided weakness and spurred speech residual from CVA. Unstable gait assisted by a cane.  Psychiatric:        Mood and Affect: Mood and affect normal.   ASSESSMENT AND PLAN:  [name] was seen today for medical management of chronic issues.  Diagnoses and all orders for this visit:  Return in about 5 months (around 11/30/2023) for chronic problems, Labs.  Suzannah Bettes G. [place], MD  [facility]. [place] [facility].

## 2023-07-05 ENCOUNTER — Ambulatory Visit: Payer: Medicare Other | Admitting: Family Medicine

## 2023-07-05 ENCOUNTER — Encounter: Payer: Self-pay | Admitting: Family Medicine

## 2023-07-05 VITALS — BP 110/78 | HR 99 | Temp 98.5°F | Resp 16 | Ht 74.4 in | Wt 223.2 lb

## 2023-07-05 DIAGNOSIS — G894 Chronic pain syndrome: Secondary | ICD-10-CM

## 2023-07-05 DIAGNOSIS — E785 Hyperlipidemia, unspecified: Secondary | ICD-10-CM

## 2023-07-05 DIAGNOSIS — I10 Essential (primary) hypertension: Secondary | ICD-10-CM | POA: Diagnosis not present

## 2023-07-05 DIAGNOSIS — G4700 Insomnia, unspecified: Secondary | ICD-10-CM | POA: Diagnosis not present

## 2023-07-05 DIAGNOSIS — G8929 Other chronic pain: Secondary | ICD-10-CM

## 2023-07-05 DIAGNOSIS — M5441 Lumbago with sciatica, right side: Secondary | ICD-10-CM | POA: Diagnosis not present

## 2023-07-05 DIAGNOSIS — Z8673 Personal history of transient ischemic attack (TIA), and cerebral infarction without residual deficits: Secondary | ICD-10-CM | POA: Diagnosis not present

## 2023-07-05 DIAGNOSIS — I639 Cerebral infarction, unspecified: Secondary | ICD-10-CM

## 2023-07-05 NOTE — Patient Instructions (Signed)
A few things to remember from today's visit:  Cerebrovascular accident (CVA), unspecified mechanism (HCC)  Essential hypertension  Hyperlipidemia with target LDL less than 70  Insomnia, unspecified type No changes today. We will check cholesterol next visit. Medication contract signed today.  If you need refills for medications you take chronically, please call your pharmacy. Do not use My Chart to request refills or for acute issues that need immediate attention. If you send a my chart message, it may take a few days to be addressed, specially if I am not in the office.  Please be sure medication list is accurate. If a new problem present, please set up appointment sooner than planned today.

## 2023-07-07 MED ORDER — OXYCODONE HCL 10 MG PO TABS
10.0000 mg | ORAL_TABLET | Freq: Two times a day (BID) | ORAL | 0 refills | Status: AC
Start: 2023-07-07 — End: ?

## 2023-07-07 MED ORDER — LOVASTATIN 20 MG PO TABS
20.0000 mg | ORAL_TABLET | Freq: Every day | ORAL | 2 refills | Status: DC
Start: 2023-07-07 — End: 2024-03-28

## 2023-07-07 NOTE — Assessment & Plan Note (Signed)
BP adequately controlled. Continue Amlodipine 2.5 mg daily and Olmesartan 20 mg daily. He has not been compliant with dietary recommendations lately, low-salt/DASH diet recommended diet. Continue monitoring BP regularly.

## 2023-07-07 NOTE — Assessment & Plan Note (Signed)
LDL 101 in 05/2023, he has not been complaint with taking statin , Lovastatin 20 mg resumed after hospitalization. FLP to be repeated next visit.

## 2023-07-07 NOTE — Assessment & Plan Note (Signed)
Problem is stable. Continue Trazodone 50 mg daily at bedtime. Stressed the importance of good good sleep hygiene.

## 2023-07-07 NOTE — Assessment & Plan Note (Signed)
Pain is adequately controlled with Oxycodone 10 mg bid and Duloxetine 60 mg daily (for depression as well). Fall precautions. Continue low impact physical activity.

## 2023-07-07 NOTE — Assessment & Plan Note (Signed)
Adequately controlled. Did remind him that marijuana is still illegal in [place] and at federal level. On Oxycodone 10 mg bid prn, no changes today. Medication contract renewed today. PMP reviewed. Follow-up in 5 months, before if needed. New Rx sent.

## 2023-07-07 NOTE — Assessment & Plan Note (Addendum)
Recent hospitalization of transient amnesias episode with no new changes in brain MRI, could have been a TIA. Continue aggressive management of CV risk factors. He is on Aspirin 81 mg daily and Lovastatin 20 mg daily.

## 2023-07-08 ENCOUNTER — Encounter: Payer: Self-pay | Admitting: Podiatry

## 2023-07-08 ENCOUNTER — Ambulatory Visit (INDEPENDENT_AMBULATORY_CARE_PROVIDER_SITE_OTHER): Payer: Medicare Other | Admitting: Podiatry

## 2023-07-08 DIAGNOSIS — M79674 Pain in right toe(s): Secondary | ICD-10-CM

## 2023-07-08 DIAGNOSIS — B351 Tinea unguium: Secondary | ICD-10-CM | POA: Diagnosis not present

## 2023-07-08 DIAGNOSIS — M79675 Pain in left toe(s): Secondary | ICD-10-CM | POA: Diagnosis not present

## 2023-07-08 DIAGNOSIS — I639 Cerebral infarction, unspecified: Secondary | ICD-10-CM

## 2023-07-08 NOTE — Progress Notes (Signed)
This patient returns to my office for at risk foot care.  This patient requires this care by a professional since this patient will be at risk due to having  CVA, PAD and hyperglycemia. He presents to the office with his wife.  This patient is unable to cut nails himself since the patient cannot reach his nails.These nails are painful walking and wearing shoes.  This patient presents for at risk foot care today.  General Appearance  Alert, conversant and in no acute stress.  Vascular  Dorsalis pedis and posterior tibial  pulses are palpable  bilaterally.  Capillary return is within normal limits  bilaterally. Temperature is within normal limits  bilaterally.  Neurologic  Senn-Weinstein monofilament wire test within normal limits  bilaterally. Muscle power within normal limits bilaterally.  Nails Thick disfigured discolored nails with subungual debris  from hallux to fifth toes bilaterally. No evidence of bacterial infection or drainage bilaterally.  Orthopedic  No limitations of motion  feet .  No crepitus or effusions noted.  Hammer toes  B/l.  Skin  normotropic skin with no porokeratosis noted bilaterally.  No signs of infections or ulcers noted.     Onychomycosis  Pain in right toes  Pain in left toes  Consent was obtained for treatment procedures.   Mechanical debridement of nails 1-5  bilaterally performed with a nail nipper.  Filed with dremel without incident.    Return office visit    10 weeks                 Told patient to return for periodic foot care and evaluation due to potential at risk complications.   Gregory Mayer DPM   

## 2023-07-20 ENCOUNTER — Other Ambulatory Visit: Payer: Self-pay | Admitting: Family Medicine

## 2023-07-20 DIAGNOSIS — G894 Chronic pain syndrome: Secondary | ICD-10-CM

## 2023-07-26 ENCOUNTER — Other Ambulatory Visit: Payer: Self-pay | Admitting: Physician Assistant

## 2023-07-26 DIAGNOSIS — R1312 Dysphagia, oropharyngeal phase: Secondary | ICD-10-CM

## 2023-07-26 DIAGNOSIS — K2270 Barrett's esophagus without dysplasia: Secondary | ICD-10-CM

## 2023-07-26 DIAGNOSIS — R6881 Early satiety: Secondary | ICD-10-CM

## 2023-08-17 ENCOUNTER — Other Ambulatory Visit: Payer: Self-pay | Admitting: Family Medicine

## 2023-08-17 DIAGNOSIS — G8929 Other chronic pain: Secondary | ICD-10-CM

## 2023-08-17 DIAGNOSIS — G894 Chronic pain syndrome: Secondary | ICD-10-CM

## 2023-08-17 MED ORDER — OXYCODONE HCL 10 MG PO TABS
10.0000 mg | ORAL_TABLET | Freq: Two times a day (BID) | ORAL | 0 refills | Status: DC
Start: 2023-08-17 — End: 2023-09-15

## 2023-08-17 NOTE — Telephone Encounter (Signed)
Prescription Request  08/17/2023  LOV: 07/05/2023  What is the name of the medication or equipment?  Oxycodone HCl 10 MG TABS   Have you contacted your pharmacy to request a refill? No   Which pharmacy would you like this sent to?   [id number] - [place], [place] - [address] AT [address] [facility] [address] Phone: [phone]  Fax: [phone]      Patient notified that their request is being sent to the clinical staff for review and that they should receive a response within 2 business days.   Please advise at Mobile [phone] (mobile)

## 2023-09-15 ENCOUNTER — Other Ambulatory Visit: Payer: Self-pay | Admitting: Family Medicine

## 2023-09-15 DIAGNOSIS — G894 Chronic pain syndrome: Secondary | ICD-10-CM

## 2023-09-15 DIAGNOSIS — G8929 Other chronic pain: Secondary | ICD-10-CM

## 2023-09-16 ENCOUNTER — Telehealth: Payer: Self-pay | Admitting: Family Medicine

## 2023-09-16 ENCOUNTER — Ambulatory Visit: Payer: Medicare Other | Admitting: Podiatry

## 2023-09-16 DIAGNOSIS — G4700 Insomnia, unspecified: Secondary | ICD-10-CM

## 2023-09-16 MED ORDER — OXYCODONE HCL 10 MG PO TABS
10.0000 mg | ORAL_TABLET | Freq: Two times a day (BID) | ORAL | 0 refills | Status: DC
Start: 2023-09-16 — End: 2023-10-21

## 2023-09-16 NOTE — Telephone Encounter (Signed)
Prescription Request  09/16/2023  LOV: 07/05/2023  What is the name of the medication or equipment?  traZODone (DESYREL) 50 MG tablet  Spouse called to say they are going on vacation and would like to take a refill with them. [facility] NOT [facility].   Have you contacted your pharmacy to request a refill? No   Which pharmacy would you like this sent to?   [id number] - , [place] [employer] -  [address] AT [facility] [place] [address] Phone: [phone] Fax: [phone]  Patient notified that their request is being sent to the clinical staff for review and that they should receive a response within 2 business days.   Please advise at Mobile [phone] (mobile)

## 2023-09-19 MED ORDER — TRAZODONE HCL 50 MG PO TABS
50.0000 mg | ORAL_TABLET | Freq: Every day | ORAL | 2 refills | Status: DC
Start: 2023-09-19 — End: 2024-03-28

## 2023-09-19 MED ORDER — TRAZODONE HCL 50 MG PO TABS
50.0000 mg | ORAL_TABLET | Freq: Every day | ORAL | 2 refills | Status: DC
Start: 2023-09-19 — End: 2023-09-19

## 2023-09-19 NOTE — Addendum Note (Signed)
Addended by: Kathreen Devoid on: 09/19/2023 07:21 AM   Modules accepted: Orders

## 2023-09-19 NOTE — Telephone Encounter (Signed)
Rx sent in

## 2023-10-05 ENCOUNTER — Encounter: Payer: Self-pay | Admitting: Adult Health

## 2023-10-05 ENCOUNTER — Ambulatory Visit (INDEPENDENT_AMBULATORY_CARE_PROVIDER_SITE_OTHER): Payer: Medicare Other | Admitting: Adult Health

## 2023-10-05 ENCOUNTER — Ambulatory Visit (INDEPENDENT_AMBULATORY_CARE_PROVIDER_SITE_OTHER): Payer: Medicare Other | Admitting: Podiatry

## 2023-10-05 ENCOUNTER — Encounter: Payer: Self-pay | Admitting: Podiatry

## 2023-10-05 VITALS — BP 121/81 | HR 72 | Ht 75.0 in | Wt 255.0 lb

## 2023-10-05 DIAGNOSIS — M79675 Pain in left toe(s): Secondary | ICD-10-CM

## 2023-10-05 DIAGNOSIS — I610 Nontraumatic intracerebral hemorrhage in hemisphere, subcortical: Secondary | ICD-10-CM

## 2023-10-05 DIAGNOSIS — I639 Cerebral infarction, unspecified: Secondary | ICD-10-CM

## 2023-10-05 DIAGNOSIS — I69354 Hemiplegia and hemiparesis following cerebral infarction affecting left non-dominant side: Secondary | ICD-10-CM

## 2023-10-05 DIAGNOSIS — G8110 Spastic hemiplegia affecting unspecified side: Secondary | ICD-10-CM

## 2023-10-05 DIAGNOSIS — B351 Tinea unguium: Secondary | ICD-10-CM

## 2023-10-05 DIAGNOSIS — M79674 Pain in right toe(s): Secondary | ICD-10-CM

## 2023-10-05 NOTE — Patient Instructions (Signed)
Consider doing botox treatment for left arm tightness - please let me know if you are interested   Continue aspirin 81 mg daily  and lovastatin  for secondary stroke prevention  Continue to follow up with PCP regarding blood pressure and cholesterol management  Maintain strict control of hypertension with blood pressure goal below 130/90, diabetes with hemoglobin A1c goal below 7.0 % and cholesterol with LDL cholesterol (bad cholesterol) goal below 70 mg/dL.   Signs of a Stroke? Follow the BEFAST method:  Balance Watch for a sudden loss of balance, trouble with coordination or vertigo Eyes Is there a sudden loss of vision in one or both eyes? Or double vision?  Face: Ask the person to smile. Does one side of the face droop or is it numb?  Arms: Ask the person to raise both arms. Does one arm drift downward? Is there weakness or numbness of a leg? Speech: Ask the person to repeat a simple phrase. Does the speech sound slurred/strange? Is the person confused ? Time: If you observe any of these signs, call 911.      Followup in the future with me in 1 year or call earlier if needed      Thank you for coming to see [place] at [facility]. I hope we have been able to provide you high quality care today.  You may receive a patient satisfaction survey over the next few weeks. We would appreciate your feedback and comments so that we may continue to improve ourselves and the health of our patients.

## 2023-10-05 NOTE — Progress Notes (Signed)
[facility] [address] [place]. [place] [address] [phone]       STROKE FOLLOW UP NOTE  [name] Date of Birth:  Dec 19, 1950 Medical Record Number:  [id number]   Primary neurologist: Richard Vaughn Reason for visit: hx of stroke    SUBJECTIVE:   CHIEF COMPLAINT:  Chief Complaint  Patient presents with   Follow-up    Patient in room #3 with his wife. Patient states he is well and stable, with no new concerns.    HPI:   Update 10/05/2023 Richard Vaughn: Patient returns for 23-month follow-up accompanied by his wife.  He was previously seen by Amy, NP and reported doing well overall.  Denies new stroke/TIA symptoms.  Cognition overall stable, MMSE today 24/30, prior 25/30.  Continued left-sided weakness, LUE stiffness which can be bothersome at times and limit function.  Ambulates with a cane, denies any recent falls. Remains on aspirin and lovastatin.  Blood pressure well-controlled.  Routinely follows with PCP [name] for stroke risk factor management.    History provided for reference purposes only 04/04/23 ALL:  Richard Vaughn is a 72 y.o. male here today for follow up for MCI and CVA. He was last seen by Dr Pearlean Vaughn 09/2022. He reported multiple falls. No using cane. MRI brain and cervical spine unremarkable. Since, he reports doing fairly well. He has continued PT/ST. He was released from PT last week and has started HEP. He has had one fall with PT since last visit. He is using his cane. He continues to have left sided weakness. Upper > lower ext. S/P rotator cuff repair which has contributed to left upper ext immobility. Speech may be slightly improved. He continues to have difficulty swallowing. Appetite is decreased. He does well when eating soft foods. He is sleeping well. BP is usually well managed. LDL last checked 11/2021 but he has follow up for CPE with PCP soon. He continues asa and lovastatin. He exercises regularly. He and his wife go to the gym and  walk at the park regularly. Memory is stable. No significant changes.   Update 09/29/2022 : She returns for follow-up after last visit with Richard Vaughn 2-1/2 months ago.  He is accompanied by his wife.  Patient states his memory difficulties was unchanged since the last visit.  He has a new complaint of worsening of his balance.  He has fallen twice in the last month and fell today as well.  Patient does not use his cane as he is supposed to.  He has residual left-sided weakness and in his left leg from his stroke.  Denies any tingling numbness or burning in his feet.  Denies any headache or vertigo.  Denies any new strokelike symptoms.  He denies any neck pain or radicular pain or back pain.  Currently is tolerating well without bruising or bleeding.  His blood pressures under good control and today it is 118/79.  He is tolerating Mevacor well without muscle aches and pains.   Update 07/13/2022 Richard Vaughn: Patient returns for acute visit accompanied by his wife. He was previously seen by Richard Vaughn 9 months ago and relatively stable at that time.  Reports last week over a 3-day period, he works transient episodes of difficulty remembering how to put his shoes on, how to go up steps, and how to get his leg to move.  These lasted less than 1 minute and then remembered how to do these tasks and completely at baseline.  Denies any  associated headache, new or worsening stroke/TIA symptoms or seizure activity.  No prior history of migraine headaches or seizures.  Denies any recent medication changes, illness or infection.  Denies any substance use.  Baseline mild cognitive impairment poststroke but believes this has been stable.  MMSE today 22/30.  Compliant on aspirin and lovastatin.  Blood pressure well controlled.  Routinely followed by PCP [name].  No further concerns at this time  Update 09/29/2021 Richard Vaughn: He returns for follow-up after last visit 4 months ago.  He is accompanied by his wife.  He  is doing well.  He has had no recurrent stroke or TIA symptoms.  Continues to have mild left-sided weakness, stiffness, speech difficulties and numbness.  Is tolerating aspirin well without bruising or bleeding.  His blood pressure is quite well controlled and today it is 111/73.  He has had no health issues except he had COVID earlier last year and recovered very well from that.  He and his wife are considering going to [facility] to consider participation in the transcranial magnetic stimulation study in the rehab program for stroke.  Update 05/26/2020 Richard Vaughn: Richard Vaughn returns accompanied by his wife for stroke follow-up.  Sooner visit requested to be cleared for torn rotator cuff surgery. Residual deficits of left-sided weakness, gait impairment with imbalance, dysarthria and cognitive impairment.  Continues to work with outpatient neuro rehab PT/OT/ST with ongoing improvement but limited rehab of LUE due to torn rotator cuff.  He did receive cortisone injection with some benefit.  Blood pressure today 112/73 - monitors at home which has been stable.  Order placed for CT head after prior visit but this has not been completed at this time.  He is currently being followed by [facility] with Dr. Jackquline Vaughn.  No further concerns at this time.  Initial visit 03/19/2020 Richard Vaughn: Richard Vaughn is being seen for hospital follow-up accompanied by his brother and wife.  Residual deficits left-sided weakness, gait impairment, dysarthria and mild to moderate cognitive communication deficits.  Continues to participate in outpatient therapies at neuro rehab with ongoing improvement.  Use of rollator walker outside of home denies any recent falls.  Blood pressure today 122/78.  History of mixed sleep apnea.  Has self discontinued CPAP approx 6 months ago due to difficulty tolerating masks.  Trialed 2 different types of masks previously but has increased anxiety with mask use.  Recent diagnosis of rotator cuff tear and  questions undergoing surgical procedure.  No further concerns at this time.   Stroke admission 02/22/2020 Richard Vaughn is a 72 y.o. 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   Stroke work-up revealed acute right thalamic hemorrhage secondary to history of HTN.  History of HTN stable during admission with long-term BP goal normotensive range.  LDL 73 with statin contraindicated with ICH.  No history of DM.  Other stroke risk factors include advanced age, PAD, tobacco use, history of substance abuse, obesity, prior history of stroke and family history of stroke.  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  Evaluated by therapies and recommended discharge to [name] for ongoing therapy needs and eventually discharged home on 03/07/2020.  Stroke: acute right thalamic hemorrhage secondary to hx HTN  Resultant dysarthria and left face and extremity weakness Code Stroke CT Head - Positive for acute right thalamic hemorrhage (6 mL) with mild surrounding edema and minimal mass effect. No intraventricular or extra-axial extension at this time. Underlying advanced chronic small vessel disease.  CTA H&N - No large vessel occlusion. Minimal atherosclerosis in the head and neck - although there is at least moderate stenosis suspected at the origin of the non-dominant left vertebral artery origin. MRI head W & WO - 2.1 x 2.1 x 2.2 cm acute intraparenchymal  hemorrhage centered at the right thalamus, unchanged from previous. Associated mild surrounding vasogenic edema and localized regional mass effect, with up to 3 mm of localized right-to-left shift. No underlying mass lesion, abnormal enhancement, or other abnormality identified. Multiple remote lacunar infarcts involving the left basal ganglia and thalamus.  2D Echo - EF 60 - 65%. No cardiac source of emboli identified.  Sars Corona Virus 2 - negative LDL - 73 HgbA1c - 5.4  VTE prophylaxis - SCDs aspirin 81 mg daily prior to admission, now on No antithrombotic Therapy recommendations:  [name] Disposition: [name]     ROS:   14 system review of systems performed and negative with exception of those listed in HPI  PMH:  Past Medical History:  Diagnosis Date   Barrett's esophagus    Chronic back pain    Depression    Gastritis    Mild   Gastroparesis    GERD (gastroesophageal reflux disease)    Headache    History of kidney stones    Hyperglycemia    Hypertension    Nephrolithiasis    Pneumonia    Covid pneumonia   Renal cyst    Sleep apnea    Stricture and stenosis of esophagus    Stroke (HCC) 07/2015   Vision abnormalities     PSH:  Past Surgical History:  Procedure Laterality Date   CERVICAL DISC SURGERY  2012   COLONOSCOPY     LUMBAR DISC SURGERY  2005, 2010   replaced L4 and L5   SHOULDER ARTHROSCOPY WITH ROTATOR CUFF REPAIR Left 08/14/2020   Procedure: SHOULDER ARTHROSCOPY WITH ROTATOR CUFF REPAIR, SUBACROMIAL DECOMPRESSION;  Surgeon: Jones Broom, MD;  Location: [facility];  Service: Orthopedics;  Laterality: Left;   UPPER GASTROINTESTINAL ENDOSCOPY      Social History:  Social History   Socioeconomic History   Marital status: Married    Spouse name: Ladislav Glotfelty   Number of children: 3   Years of education: Not on file   Highest education level: Some college, no degree  Occupational History   Occupation: retired    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[employer]  Tobacco Use    Smoking status: Former    Current packs/day: 0.00    Average packs/day: 0.5 packs/day for 30.0 years (15.0 ttl pk-yrs)    Types: Cigarettes    Start date: 04/15/1988    Quit date: 04/15/2018    Years since quitting: 5.4   Smokeless tobacco: Never  Vaping Use   Vaping status: Never Used  Substance and Sexual Activity   Alcohol use: No    Alcohol/week: 0.0 standard drinks of alcohol   Drug use: Never    Comment: last use 04/2018   Sexual activity: Yes  Other Topics Concern   Not on file  Social  History Narrative   Not on file   Social Determinants of Health   Financial Resource Strain: Low Risk  (04/25/2023)   Overall Financial Resource Strain (CARDIA)    Difficulty of Paying Living Expenses: Not hard at all  Food Insecurity: No Food Insecurity (07/05/2023)   Hunger Vital Sign    Worried About Running Out of Food in the Last Year: Never true    Ran Out of Food in the Last Year: Never true  Transportation Needs: No Transportation Needs (07/05/2023)   PRAPARE - Administrator, Civil Service (Medical): No    Lack of Transportation (Non-Medical): No  Physical Activity: Sufficiently Active (07/05/2023)   Exercise Vital Sign    Days of Exercise per Week: 5 days    Minutes of Exercise per Session: 60 min  Stress: No Stress Concern Present (07/05/2023)   [employer] of Occupational Health - Occupational Stress Questionnaire    Feeling of Stress : Only a little  Social Connections: Moderately Isolated (07/05/2023)   Social Connection and Isolation Panel [NHANES]    Frequency of Communication with Friends and Family: Once a week    Frequency of Social Gatherings with Friends and Family: More than three times a week    Attends Religious Services: Never    Database administrator or Organizations: No    Attends Banker Meetings: Never    Marital Status: Married  Catering manager Violence: Not At Risk (06/09/2023)   Humiliation, Afraid, Rape, and Kick questionnaire     Fear of Current or Ex-Partner: No    Emotionally Abused: No    Physically Abused: No    Sexually Abused: No    Family History:  Family History  Problem Relation Age of Onset   Hypertension Father    Stroke Father    Hypertension Mother    Liver cancer Mother    Hypertension Sister        2 other sisters has also   Stroke Sister    Stroke Sister    Hypertension Brother        2nd brother has also   Colon cancer Neg Hx    Esophageal cancer Neg Hx    Rectal cancer Neg Hx    Stomach cancer Neg Hx    Colon polyps Neg Hx     Medications:   Current Outpatient Medications on File Prior to Visit  Medication Sig Dispense Refill   amLODipine (NORVASC) 2.5 MG tablet TAKE 1 TABLET BY MOUTH AT BEDTIME. 90 tablet 2   aspirin EC 81 MG tablet Take 1 tablet (81 mg total) by mouth daily. Swallow whole. 30 tablet 11   docusate sodium (COLACE) 100 MG capsule Take 100 mg by mouth daily.     DULoxetine (CYMBALTA) 60 MG capsule TAKE 1 CAPSULE BY MOUTH EVERY DAY 90 capsule 1   esomeprazole (NEXIUM) 40 MG capsule TAKE 1 CAPSULE (40 MG TOTAL) BY MOUTH 2 (TWO) TIMES DAILY BEFORE A MEAL. 180 capsule 1   lovastatin (MEVACOR) 20 MG tablet Take 1 tablet (20 mg total) by mouth at bedtime. 90 tablet 2   Melatonin 10 MG TABS Take 1 tablet by mouth at bedtime.     olmesartan (BENICAR) 20 MG tablet Take 1 tablet (20 mg total) by mouth daily. 90 tablet 3   Oxycodone HCl 10 MG TABS Take 1 tablet (10 mg total) by mouth 2 (two) times daily. 60 tablet 0   traZODone (DESYREL) 50 MG tablet Take 1 tablet (  50 mg total) by mouth at bedtime. 90 tablet 2   No current facility-administered medications on file prior to visit.    Allergies:  No Known Allergies    OBJECTIVE:  Physical Exam  Vitals:   10/05/23 1334  BP: 121/81  Pulse: 72  Weight: 255 lb (115.7 kg)  Height: 6\' 3"  (1.905 m)   Body mass index is 31.87 kg/m. No results found.   General: well developed, well nourished, pleasant middle-aged  African-American male, seated, in no evident distress  Neurologic Exam Mental Status: Awake and fully alert. moderate dysarthria.  No evidence of aphasia.  Oriented to place and time. Recent memory impaired and remote memory intact. Attention span, concentration and fund of knowledge appropriate during visit. Mood and affect appropriate.     10/05/2023    1:44 PM 04/04/2023    1:19 PM 07/13/2022    1:11 PM  MMSE - Mini Mental State Exam  Orientation to time 5 5 4   Orientation to Place 5 5 5   Registration 3 3 3   Attention/ Calculation 1 0 1  Recall 3 3 1   Language- name 2 objects 2 2 2   Language- repeat 1 1 1   Language- follow 3 step command 3 3 3   Language- read & follow direction 0 1 1  Write a sentence 1 1 1   Copy design 0 1 0  Total score 24 25 22    Cranial Nerves: Pupils equal, briskly reactive to light. Extraocular movements full without nystagmus. Visual fields full to confrontation. Hearing intact. Facial sensation intact.  Left lower facial weakness.  Tongue, palate moves normally and symmetrically.  Motor: Normal bulk and tone. Normal strength in all tested extremity muscles except mild decreased left hand dexterity.  Tone is increased on the left compared to the right.   Sensory.: intact to touch , pinprick , position and vibratory sensation.  Coordination: Rapid alternating movements normal in all extremities except slightly decreased left hand. Finger-to-nose performed accurately RUE and heel-to-shin show mild LLE ataxia. Gait and Station: Arises from chair without difficulty. Stance is normal. Gait demonstrates  hemiplegic gait with use of cane.  With slight dragging of the left leg.  Tandem walk not attempted Reflexes: 1+ and symmetric. Toes downgoing.         ASSESSMENT/PLAN: WILBUR Vaughn is a 72 y.o. year old male with ICH  in right thalamus/posterior limb likely secondary to HTN in 02/2018 with residual mild left-sided weakness, mild dysarthria, gait impairment and  mild cognitive impairment  Vascular risk factors include multiple prior strokes including lacunar infarcts and small hypertensive bleeds, HTN, HLD, PAD, sleep apnea, history of substance abuse and family history of stroke.      Hx of ICH   -Referral placed to PMR to discuss benefit of Botox for LUE  -Continue aspirin and lovastatin for secondary stroke prevention measures  -Continue to follow closely with PCP for aggressive stroke risk factor management including BP goal<130/90 and HLD with LDL goal<70     Follow-up in 1 year mammography repeated   CC:  Richard Vaughn, [name], MD   I spent 30 minutes of face-to-face and non-face-to-face time with patient and wife.  This included previsit chart review, lab review, study review, order entry, electronic health record documentation, patient education and discussion regarding above diagnoses and treatment plan and answered all other questions to patient and wife's satisfaction  Ihor Austin, [facility]  [facility] [address] [place], [place] [address]  Phone [phone] Fax  [phone] Note: This document was prepared with digital dictation and possible smart phrase technology. Any transcriptional errors that result from this process are unintentional.

## 2023-10-05 NOTE — Progress Notes (Signed)
This patient returns to my office for at risk foot care.  This patient requires this care by a professional since this patient will be at risk due to having  CVA, PAD and hyperglycemia. He presents to the office with his wife.  This patient is unable to cut nails himself since the patient cannot reach his nails.These nails are painful walking and wearing shoes.  This patient presents for at risk foot care today.  General Appearance  Alert, conversant and in no acute stress.  Vascular  Dorsalis pedis and posterior tibial  pulses are palpable  bilaterally.  Capillary return is within normal limits  bilaterally. Temperature is within normal limits  bilaterally.  Neurologic  Senn-Weinstein monofilament wire test within normal limits  bilaterally. Muscle power within normal limits bilaterally.  Nails Thick disfigured discolored nails with subungual debris  from hallux to fifth toes bilaterally. No evidence of bacterial infection or drainage bilaterally.  Orthopedic  No limitations of motion  feet .  No crepitus or effusions noted.  Hammer toes  B/l.  Skin  normotropic skin with no porokeratosis noted bilaterally.  No signs of infections or ulcers noted.     Onychomycosis  Pain in right toes  Pain in left toes  Consent was obtained for treatment procedures.   Mechanical debridement of nails 1-5  bilaterally performed with a nail nipper.  Filed with dremel without incident.    Return office visit    10 weeks                 Told patient to return for periodic foot care and evaluation due to potential at risk complications.   Lillis Nuttle DPM   

## 2023-10-19 ENCOUNTER — Encounter: Payer: Self-pay | Admitting: Physical Medicine & Rehabilitation

## 2023-10-21 ENCOUNTER — Other Ambulatory Visit: Payer: Self-pay | Admitting: Family Medicine

## 2023-10-21 DIAGNOSIS — G8929 Other chronic pain: Secondary | ICD-10-CM

## 2023-10-21 DIAGNOSIS — G894 Chronic pain syndrome: Secondary | ICD-10-CM

## 2023-10-25 MED ORDER — OXYCODONE HCL 10 MG PO TABS
10.0000 mg | ORAL_TABLET | Freq: Two times a day (BID) | ORAL | 0 refills | Status: DC
Start: 2023-10-25 — End: 2023-11-18

## 2023-11-14 ENCOUNTER — Encounter: Payer: Self-pay | Admitting: Family Medicine

## 2023-11-17 ENCOUNTER — Encounter: Payer: Self-pay | Admitting: Physical Medicine & Rehabilitation

## 2023-11-17 ENCOUNTER — Encounter
Payer: Federal, State, Local not specified - PPO | Attending: Physical Medicine & Rehabilitation | Admitting: Physical Medicine & Rehabilitation

## 2023-11-17 VITALS — BP 122/79 | HR 71 | Ht 75.0 in | Wt 247.2 lb

## 2023-11-17 DIAGNOSIS — G248 Other dystonia: Secondary | ICD-10-CM | POA: Diagnosis not present

## 2023-11-17 DIAGNOSIS — M7502 Adhesive capsulitis of left shoulder: Secondary | ICD-10-CM | POA: Insufficient documentation

## 2023-11-17 DIAGNOSIS — I69154 Hemiplegia and hemiparesis following nontraumatic intracerebral hemorrhage affecting left non-dominant side: Secondary | ICD-10-CM | POA: Diagnosis present

## 2023-11-17 DIAGNOSIS — G8110 Spastic hemiplegia affecting unspecified side: Secondary | ICD-10-CM | POA: Insufficient documentation

## 2023-11-17 NOTE — Progress Notes (Signed)
 Subjective:    Patient ID: Richard Vaughn, male    DOB: 1951-07-19, 73 y.o.   MRN: [id number] history of Barrett's esophagus with GERD, history of esophageal stricture, HTN, chronic back pain, hypertensive bleeds--last 04/2018, OSA who was admitted on 02/22/2020 with headache, visual changes, left hemiparesis and slurred speech.  CT head showed acute right thalamic hemorrhage with mild surrounding edema and min mass-effect as well as advanced chronic small vessel disease.  CTA head/neck was negative for LVO and incidental finding of 14 mm left thyroid  nodule noted with recommendation for dedicated thyroid  ultrasound.  MRI brain revealed right thalamic IPH with mild edema and 3 mm localized right-to-left shift.  Dr. Rosemarie felt that thalamic hemorrhage was hypertensive in nature.  Patient has had issues with low-grade fevers felt to be due to viral bronchopneumonia and has been monitored.  Therapy ongoing and patient limited by LLE weakness with ataxia, dysarthria and LUE weakness affecting ADLs and mobility.  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  His past medical history significant for left shoulder rotator cuff repair by Dr. Dozier in September 2021.  He was last seen in physical therapy earlier this year in the spring.  The patient was also admitted for observation from 7/24 to 06/09/2023 for transient global amnesia  73 year old male who has history of residual left-sided weakness following a right thalamic hemorrhage in 2021.  He still needs some assistance when transferring from low surfaces.  He uses a cane to ambulate.  He is able to climb steps.  He does not drive anymore.  He has residual problems with his speech  mainly dysarthria.  He still requires assistance with meal prep and household duties but is able to dress and bathe himself.  He lives with his wife and has 3 grown children.  His home has 2 steps to enter and is on 2 levels.  He continues to workout at [facility] well using machines including bench press and pulldown  STUDY DATE: 06/06/2020 PATIENT NAME: Richard Vaughn DOB: 07-May-1951 MRN: [id number]   EXAM: CT of the head without contrast   ORDERING CLINICIAN: Harlene Bogaert, NP CLINICAL HISTORY: 73 year old man with history of thalamic hemorrhagic stroke COMPARISON FILMS: MRI 02/23/2020   TECHNIQUE: CT scan of the head was obtained utilizing 5 mm axial slices from the skull base to the vertex. CONTRAST: None IMAGING SITE: [facility],  [address]   FINDINGS: The fourth, third and lateral ventricles are normal in size and appearance.  Incidental note is made of a cavum of septum pellucidum.  No extra-axial fluid collections are seen.  No intracranial hemorrhage.  The subacute hemorrhage in the right thalamus noted on the 02/23/2020 MRI has resolved.  There is no evidence of a lacunar infarction in the right thalamus as well as in the left basal ganglia and deep white matter of the left frontal lobe.  Elsewhere there is extensive hypodense white matter change consistent with extensive chronic microvascular ischemic change.  No evidence of mass effect or midline shift.  Chronic microvascular changes in the pons are noted.  The cerebellum appears normal.  Some fluid is noted in the left mastoid air cells.  The right mastoid air cells, the orbits and their contents, paranasal sinuses and calvarium  are unremarkable.       IMPRESSION: This CT scan of the head without contrast shows the following: 1.   There are no acute findings. 2.    The hemorrhagic infarction noted on the 02/23/2020 MRI in the right thalamus has evolved.  There are no longer any acute blood products or mass-effect.  The  appearance is now of a chronic lacunar infarction. 3.   Additional chronic lacunar infarctions noted in the left basal ganglia in the deep white matter of the left frontal lobe.  These were also noted on the MRI from April 2021. 4.   Extensive white matter changes consistent with severe chronic microvascular ischemic change. 5.   Fluid in the left mastoid air cells.  This is also seen on the previous MRI.  This is likely due to eustachian tube dysfunction.       INTERPRETING PHYSICIAN:  Richard A. Vear, MD, PhD, FAAN Certified in  Neuroimaging by [employer] Neuro[employer] Pain Inventory Average Pain 5 Pain Right Now 0 My pain is aching  LOCATION OF PAIN  left shoulder pain and back  BOWEL Number of stools per week: 2 Oral laxative use Yes  Type of laxative colace  BLADDER Normal Frequent urination Yes   Mobility use a cane how many minutes can you walk? 30-45 ability to climb steps?  yes do you drive?  no  Function retired I need assistance with the following:  meal prep, household duties, and shopping  Neuro/Psych trouble walking depression  Prior Studies Any changes since last visit?  no  Physicians involved in your care Primary care Dr Jordan   Family History  Problem Relation Age of Onset   Hypertension Father    Stroke Father    Hypertension Mother    Liver cancer Mother    Hypertension Sister        2 other sisters has also   Stroke Sister    Stroke Sister    Hypertension Brother        2nd brother has also   Colon cancer Neg Hx    Esophageal cancer Neg Hx    Rectal cancer Neg Hx    Stomach cancer Neg Hx    Colon polyps Neg Hx    Social History   Socioeconomic History   Marital status: Married    Spouse name: Henry Utsey   Number of children: 3   Years of education: Not on file   Highest education level: Some college, no degree  Occupational History   Occupation: retired    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[employer]  Tobacco Use   Smoking  status: Former    Current packs/day: 0.00    Average packs/day: 0.5 packs/day for 30.0 years (15.0 ttl pk-yrs)    Types: Cigarettes    Start date: 04/15/1988    Quit date: 04/15/2018    Years since quitting: 5.5   Smokeless tobacco: Never  Vaping Use   Vaping status: Never Used  Substance and Sexual Activity   Alcohol use: No    Alcohol/week: 0.0 standard drinks of alcohol   Drug use: Never    Comment: last use 04/2018   Sexual activity: Yes  Other Topics Concern   Not on file  Social History Narrative   Not on file   Social Drivers of Health   Financial Resource Strain: Low Risk  (04/25/2023)   Overall Financial Resource Strain (CARDIA)    Difficulty of Paying Living Expenses: Not hard at all  Food Insecurity:  No Food Insecurity (07/05/2023)   Hunger Vital Sign    Worried About Running Out of Food in the Last Year: Never true    Ran Out of Food in the Last Year: Never true  Transportation Needs: No Transportation Needs (07/05/2023)   PRAPARE - Administrator, Civil Service (Medical): No    Lack of Transportation (Non-Medical): No  Physical Activity: Sufficiently Active (07/05/2023)   Exercise Vital Sign    Days of Exercise per Week: 5 days    Minutes of Exercise per Session: 60 min  Stress: No Stress Concern Present (07/05/2023)   [employer] of Occupational Health - Occupational Stress Questionnaire    Feeling of Stress : Only a little  Social Connections: Moderately Isolated (07/05/2023)   Social Connection and Isolation Panel [NHANES]    Frequency of Communication with Friends and Family: Once a week    Frequency of Social Gatherings with Friends and Family: More than three times a week    Attends Religious Services: Never    Database Administrator or Organizations: No    Attends Banker Meetings: Never    Marital Status: Married   Past Surgical History:  Procedure Laterality Date   CERVICAL DISC SURGERY  2012   COLONOSCOPY     LUMBAR DISC  SURGERY  2005, 2010   replaced L4 and L5   SHOULDER ARTHROSCOPY WITH ROTATOR CUFF REPAIR Left 08/14/2020   Procedure: SHOULDER ARTHROSCOPY WITH ROTATOR CUFF REPAIR, SUBACROMIAL DECOMPRESSION;  Surgeon: Dozier Soulier, MD;  Location: [facility];  Service: Orthopedics;  Laterality: Left;   UPPER GASTROINTESTINAL ENDOSCOPY     Past Medical History:  Diagnosis Date   Barrett's esophagus    Chronic back pain    Depression    Gastritis    Mild   Gastroparesis    GERD (gastroesophageal reflux disease)    Headache    History of kidney stones    Hyperglycemia    Hypertension    Nephrolithiasis    Pneumonia    Covid pneumonia   Renal cyst    Sleep apnea    Stricture and stenosis of esophagus    Stroke (HCC) 07/2015   Vision abnormalities    BP 122/79   Pulse 71   Ht 6' 3 (1.905 m)   Wt 247 lb 3.2 oz (112.1 kg)   SpO2 95%   BMI 30.90 kg/m   Opioid Risk Score:   Fall Risk Score:  `1  Depression screen [facility] 2/9     11/17/2023    3:16 PM 07/05/2023    4:28 PM 04/25/2023   11:20 AM 02/02/2023    2:34 PM 11/03/2022    3:19 PM 06/08/2022    3:15 PM 04/21/2022   10:22 AM  Depression screen PHQ 2/9  Decreased Interest 3 0 0 0 0 2 0  Down, Depressed, Hopeless 0 0 0 0 1 1 0  PHQ - 2 Score 3 0 0 0 1 3 0  Altered sleeping 0 0  0 1 1   Tired, decreased energy 0 0  0 0 0   Change in appetite 0 0  0 0 0   Feeling bad or failure about yourself  3 0  0 2 0   Trouble concentrating 0 0  0 0 0   Moving slowly or fidgety/restless 0 0  0 0 0   Suicidal thoughts 0 0  0 1 0   PHQ-9 Score 6 0  0 5 4  Difficult doing work/chores  Not difficult at all  Not difficult at all Somewhat difficult Not difficult at all      Review of Systems  Musculoskeletal:  Positive for gait problem.       Left shoulder  Psychiatric/Behavioral:  Positive for dysphoric mood.   All other systems reviewed and are negative.      Objective:   Physical Exam General No acute distress Mood and affect  appropriate Motor strength is 5/5 in the right deltoid, bicep, tricep, grip, hip flexor, knee extensor, ankle dorsiflexor and plantar flex Left upper extremity 4/5 in the deltoid, bicep, tricep, grip, hip flexor, knee extensor, ankle dorsiflexor and plantar flexor MSK Normal range of motion in the right upper limb and right lower limb Left upper extremity essentially no external rotation at the shoulder abduction is around 30 degrees the patient has full elbow range of motion as well as wrist and finger range of motion in the left upper extremity. Tone there is no evidence of spasticity per se but does have athetoid movements of the left upper extremity including keeping his hand behind his back and in his pocket to avoid problems during ambulation.  He also has athetoid movements at the elbow wrist and fingers. Decreased sensation left upper extremity The patient does have tonically increased tone at the left pectoralis Patient ambulates with a cane he needs min assist to transfer from sit to stand from a lower surface.  He ambulates with a short step length and shuffling type of gait however he has no evidence of knee instability or foot drop      Assessment & Plan:   1.  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  We discussed this is more difficult to control with the botulinum toxin but still it is a treatment option for him.  Given his pattern and his primary complaint would recommend starting with pectoralis muscle 200 units.  If he does not have any significant improvement of his shoulder range of motion we may need to do a suprascapular nerve block under ultrasound guidance.  We discussed oral medications however he does not wish to run the risk of having any type of sedation.  We discussed the risks and benefits of botulinum toxin  including weakness while the medication is active to.  We also discussed the possibility of bleeding bruising and infection due to needlesticks.  In addition we discussed the usual onset of action after 1 week as well as the duration of action of 3 months to

## 2023-11-17 NOTE — Patient Instructions (Signed)
 OnabotulinumtoxinA Injection (Medical Use) What is this medication? ONABOTULINUMTOXINA (o na BOTT you lye num tox in eh) treats severe muscle spasms. It may also be used to prevent migraine headaches. It can treat excessive sweating when other medications do not work well enough. This medicine may be used for other purposes; ask your health care provider or pharmacist if you have questions. COMMON BRAND NAME(S): Botox What should I tell my care team before I take this medication? They need to know if you have any of these conditions: Breathing problems Cerebral palsy spasms Difficulty urinating Heart problems History of surgery where this medication is going to be used Infection at the site where this medication is going to be used Myasthenia gravis or other neurologic disease Nerve or muscle disease Surgery plans Take medications that treat or prevent blood clots Thyroid problems An unusual or allergic reaction to botulinum toxin, albumin, other medications, foods, dyes, or preservatives Pregnant or trying to get pregnant Breast-feeding How should I use this medication? This medication is for injected into a muscle. It is given by your care team in a hospital or clinic setting. A special MedGuide will be given to you before each treatment. Be sure to read this information carefully each time. Talk to your care team about the use of this medication in children. While this medication may be prescribed for children as young as 2 years for selected conditions, precautions do apply. Overdosage: If you think you have taken too much of this medicine contact a poison control center or emergency room at once. NOTE: This medicine is only for you. Do not share this medicine with others. What if I miss a dose? This does not apply. What may interact with this medication? Aminoglycoside antibiotics, such as gentamicin, neomycin, tobramycin Muscle relaxants Other botulinum toxin injections This  list may not describe all possible interactions. Give your health care provider a list of all the medicines, herbs, non-prescription drugs, or dietary supplements you use. Also tell them if you smoke, drink alcohol, or use illegal drugs. Some items may interact with your medicine. What should I watch for while using this medication? Visit your care team for regular check ups. This medication will cause weakness in the muscle where it is injected. Tell your care team if you feel unusually weak in other muscles. Get medical help right away if you have problems with breathing, swallowing, or talking. This medication might make your eyelids droop or make you see blurry or double. If you have weak muscles or trouble seeing do not drive a car, use machinery, or do other dangerous activities. This medication contains albumin from human blood. It may be possible to pass an infection in this medication, but no cases have been reported. Talk to your care team about the risks and benefits of this medication. If your activities have been limited by your condition, go back to your regular routine slowly after treatment with this medication. What side effects may I notice from receiving this medication? Side effects that you should report to your care team as soon as possible: Allergic reactions--skin rash, itching, hives, swelling of the face, lips, tongue, or throat Dryness or irritation of the eyes, eye pain, change in vision, sensitivity to light Infection--fever, chills, cough, sore throat, wounds that don't heal, pain or trouble when passing urine, general feeling of discomfort or being unwell Spread of botulinum toxin effects--unusual weakness or fatigue, blurry or double vision, trouble swallowing, hoarseness or trouble speaking, trouble breathing, loss of bladder  control Trouble passing urine Side effects that usually do not require medical attention (report these to your care team if they continue or are  bothersome): Dry mouth Eyelid drooping Fatigue Headache Pain, redness, or irritation at injection site This list may not describe all possible side effects. Call your doctor for medical advice about side effects. You may report side effects to FDA at 1-800-FDA-1088. Where should I keep my medication? This medication is given in a hospital or clinic and will not be stored at home. NOTE: This sheet is a summary. It may not cover all possible information. If you have questions about this medicine, talk to your doctor, pharmacist, or health care provider.  2024 Elsevier/Gold Standard (2021-10-29 00:00:00)

## 2023-11-18 ENCOUNTER — Other Ambulatory Visit: Payer: Self-pay | Admitting: Family Medicine

## 2023-11-18 DIAGNOSIS — G8929 Other chronic pain: Secondary | ICD-10-CM

## 2023-11-18 DIAGNOSIS — G894 Chronic pain syndrome: Secondary | ICD-10-CM

## 2023-11-21 MED ORDER — OXYCODONE HCL 10 MG PO TABS
10.0000 mg | ORAL_TABLET | Freq: Two times a day (BID) | ORAL | 0 refills | Status: DC
Start: 2023-11-21 — End: 2023-12-24

## 2023-12-05 ENCOUNTER — Ambulatory Visit (INDEPENDENT_AMBULATORY_CARE_PROVIDER_SITE_OTHER): Payer: Federal, State, Local not specified - PPO | Admitting: Family Medicine

## 2023-12-05 VITALS — BP 130/80 | HR 88 | Resp 16 | Ht 75.0 in | Wt 250.0 lb

## 2023-12-05 DIAGNOSIS — I674 Hypertensive encephalopathy: Secondary | ICD-10-CM | POA: Diagnosis not present

## 2023-12-05 DIAGNOSIS — Z136 Encounter for screening for cardiovascular disorders: Secondary | ICD-10-CM

## 2023-12-05 DIAGNOSIS — E785 Hyperlipidemia, unspecified: Secondary | ICD-10-CM

## 2023-12-05 DIAGNOSIS — G894 Chronic pain syndrome: Secondary | ICD-10-CM

## 2023-12-05 DIAGNOSIS — Z87891 Personal history of nicotine dependence: Secondary | ICD-10-CM

## 2023-12-05 DIAGNOSIS — G4700 Insomnia, unspecified: Secondary | ICD-10-CM | POA: Diagnosis not present

## 2023-12-05 NOTE — Patient Instructions (Addendum)
A few things to remember from today's visit:  Insomnia, unspecified type  Chronic pain syndrome  Systolic hypertension with cerebrovascular disease - Plan: Basic metabolic panel  Hyperlipidemia with target LDL less than 70 - Plan: Lipid panel  No changes today.  You can add Senakot or bisacodyl. Blood pressure goal 120/70's  If you need refills for medications you take chronically, please call your pharmacy. Do not use My Chart to request refills or for acute issues that need immediate attention. If you send a my chart message, it may take a few days to be addressed, specially if I am not in the office.  Please be sure medication list is accurate. If a new problem present, please set up appointment sooner than planned today.

## 2023-12-05 NOTE — Progress Notes (Signed)
HPI: [name] is a 73 y.o. male with a PMHx significant for insomnia, chronic pain, HTN, OSA, GERD, HLD, CVA, and depression, among many others, who is here today with his wife for chronic disease management.  Last seen on 07/05/2023. He reports no new problems since his last visit. He has since followed with neurology, podiatry, and physical medicine.   Hypertension:  Medications: Currently on amlodipine 2.5 mg daily and Olmesartan 20 mg daily.  BP readings at home: He has been checking his BP regularly at home, and says the readings are similar to today, sometimes lower.  Side effects: none Negative for unusual or severe headache, visual changes, exertional chest pain, dyspnea, new focal weakness, or edema. His wife says he hasn't had an eye exam for about 2 years.   Lab Results  Component Value Date   CREATININE 0.89 06/09/2023   BUN 7 (L) 06/09/2023   NA 138 06/09/2023   K 3.7 06/09/2023   CL 103 06/09/2023   CO2 27 06/09/2023   Hyperlipidemia: Currently on Lovastatin 20 mg daily.  Side effects from medication: none Lab Results  Component Value Date   CHOL 164 06/09/2023   HDL 48 06/09/2023   LDLCALC 101 (H) 06/09/2023   TRIG 76 06/09/2023   CHOLHDL 3.4 06/09/2023   Chronic pain/depression:  Currently taking  oxycodone 10 mg twice daily and duloxetine 60 mg daily.  Pain is mainly lower back pain resulted from frequent heavy lifting at work,  [employer]. He was established with pain management but provider, Dr. Penelope Galas, left the practice and  he was no not able to establish with new provider in the same practice.  Pain is constant, achy 5-6/10, radiated to right lower extremity.  It is exacerbated by movement and alleviated by rest. Chronic left shoulder pain. Medication is still helping, he is able to function and perform most ADL's. He uses a cane for transfer.  Unstable gait, he completed PT and not he is exercising regularly, he exercises 3 times per  week. He says his bowel movements have improved. Currently on Colace 100 mg daily.   Insomnia:  His wife reports he is still falling asleep in front of the TV and then coming to bed around 3 am. In total, he sleeps 7-8 hours per night.  Currently taking trazodone 50 mg at nighttime.   Review of Systems  Constitutional:  Negative for activity change, appetite change, chills and fever.  HENT:  Negative for sore throat.   Respiratory:  Negative for cough and wheezing.   Gastrointestinal:  Negative for abdominal pain, nausea and vomiting.  Genitourinary:  Negative for decreased urine volume, dysuria and hematuria.  Skin:  Negative for rash.  Neurological:  Negative for syncope.  Psychiatric/Behavioral:  Negative for confusion and hallucinations.   See other pertinent positives and negatives in HPI.  Current Outpatient Medications on File Prior to Visit  Medication Sig Dispense Refill   amLODipine (NORVASC) 2.5 MG tablet TAKE 1 TABLET BY MOUTH AT BEDTIME. 90 tablet 2   aspirin EC 81 MG tablet Take 1 tablet (81 mg total) by mouth daily. Swallow whole. 30 tablet 11   docusate sodium (COLACE) 100 MG capsule Take 100 mg by mouth daily.     DULoxetine (CYMBALTA) 60 MG capsule TAKE 1 CAPSULE BY MOUTH EVERY DAY 90 capsule 1   esomeprazole (NEXIUM) 40 MG capsule TAKE 1 CAPSULE (40 MG TOTAL) BY MOUTH 2 (TWO) TIMES DAILY BEFORE A MEAL. 180 capsule 1  lovastatin (MEVACOR) 20 MG tablet Take 1 tablet (20 mg total) by mouth at bedtime. 90 tablet 2   Melatonin 10 MG TABS Take 1 tablet by mouth at bedtime.     olmesartan (BENICAR) 20 MG tablet Take 1 tablet (20 mg total) by mouth daily. 90 tablet 3   Oxycodone HCl 10 MG TABS Take 1 tablet (10 mg total) by mouth 2 (two) times daily. 60 tablet 0   traZODone (DESYREL) 50 MG tablet Take 1 tablet (50 mg total) by mouth at bedtime. 90 tablet 2   No current facility-administered medications on file prior to visit.    Past Medical History:  Diagnosis Date    Barrett's esophagus    Chronic back pain    Depression    Gastritis    Mild   Gastroparesis    GERD (gastroesophageal reflux disease)    Headache    History of kidney stones    Hyperglycemia    Hypertension    Nephrolithiasis    Pneumonia    Covid pneumonia   Renal cyst    Sleep apnea    Stricture and stenosis of esophagus    Stroke (HCC) 07/2015   Vision abnormalities    No Known Allergies  Social History   Socioeconomic History   Marital status: Married    Spouse name: Richard Vaughn   Number of children: 3   Years of education: Not on file   Highest education level: Some college, no degree  Occupational History   Occupation: retired    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[employer]  Tobacco Use   Smoking status: Former    Current packs/day: 0.00    Average packs/day: 0.5 packs/day for 30.0 years (15.0 ttl pk-yrs)    Types: Cigarettes    Start date: 04/15/1988    Quit date: 04/15/2018    Years since quitting: 5.6   Smokeless tobacco: Never  Vaping Use   Vaping status: Never Used  Substance and Sexual Activity   Alcohol use: No    Alcohol/week: 0.0 standard drinks of alcohol   Drug use: Never    Comment: last use 04/2018   Sexual activity: Yes  Other Topics Concern   Not on file  Social History Narrative   Not on file   Social Drivers of Health   Financial Resource Strain: Low Risk  (12/05/2023)   Overall Financial Resource Strain (CARDIA)    Difficulty of Paying Living Expenses: Not hard at all  Food Insecurity: No Food Insecurity (12/05/2023)   Hunger Vital Sign    Worried About Running Out of Food in the Last Year: Never true    Ran Out of Food in the Last Year: Never true  Transportation Needs: No Transportation Needs (12/05/2023)   PRAPARE - Administrator, Civil Service (Medical): No    Lack of Transportation (Non-Medical): No  Physical Activity: Sufficiently Active (12/05/2023)   Exercise Vital Sign    Days of Exercise per Week: 5 days    Minutes of Exercise  per Session: 50 min  Stress: No Stress Concern Present (12/05/2023)   [employer] of Occupational Health - Occupational Stress Questionnaire    Feeling of Stress : Only a little  Social Connections: Moderately Isolated (12/05/2023)   Social Connection and Isolation Panel [NHANES]    Frequency of Communication with Friends and Family: Once a week    Frequency of Social Gatherings with Friends and Family: More than three times a week    Attends Religious Services: Never  Active Member of Clubs or Organizations: No    Attends Banker Meetings: Never    Marital Status: Married    Vitals:   12/05/23 1458  BP: 130/80  Pulse: 88  Resp: 16  SpO2: 97%   Body mass index is 31.25 kg/m.  Physical Exam Vitals and nursing note reviewed.  Constitutional:      General: He is not in acute distress.    Appearance: He is well-developed.  HENT:     Head: Normocephalic and atraumatic.     Mouth/Throat:     Mouth: Mucous membranes are moist.     Pharynx: Oropharynx is clear. Uvula midline.  Eyes:     Conjunctiva/sclera: Conjunctivae normal.  Cardiovascular:     Rate and Rhythm: Normal rate and regular rhythm.     Pulses:          Dorsalis pedis pulses are 2+ on the right side and 2+ on the left side.     Heart sounds: No murmur heard.    Comments: PT pulses present Pulmonary:     Effort: Pulmonary effort is normal. No respiratory distress.     Breath sounds: Normal breath sounds.  Abdominal:     Palpations: Abdomen is soft. There is no mass.     Tenderness: There is no abdominal tenderness.  Musculoskeletal:     Right lower leg: No edema.     Left lower leg: No edema.  Lymphadenopathy:     Cervical: No cervical adenopathy.  Skin:    General: Skin is warm.     Findings: No erythema or rash.  Neurological:     Mental Status: He is alert and oriented to person, place, and time.     Comments: Left-sided weakness and slurred speech residual from CVA. Unstable gait  assisted by a cane.  Psychiatric:        Mood and Affect: Mood and affect normal.   ASSESSMENT AND PLAN:  Mr. Osada was seen today for chronic disease management.   Orders Placed This Encounter  Procedures   [facility] MEDICARE SCREENING   Basic metabolic panel   Lipid panel   Lab Results  Component Value Date   CHOL 143 12/05/2023   HDL 44.60 12/05/2023   LDLCALC 80 12/05/2023   TRIG 93.0 12/05/2023   CHOLHDL 3 12/05/2023   Lab Results  Component Value Date   NA 139 12/05/2023   CL 106 12/05/2023   K 4.1 12/05/2023   CO2 25 12/05/2023   BUN 9 12/05/2023   CREATININE 0.97 12/05/2023   GFR 79.32 12/05/2023   CALCIUM 9.0 12/05/2023   ALBUMIN 3.9 06/09/2023   GLUCOSE 89 12/05/2023   Insomnia, unspecified type Assessment & Plan: Problem is stable. Continue Trazodone 50 mg daily at bedtime. Try to improve sleep hygiene. Some side effects of Trazodone discussed.   Chronic pain syndrome Assessment & Plan: Stable, pain is adequately controlled,  On Oxycodone 10 mg bid prn, no changes today. Medication contract is current. PMP reviewed. Follow-up in 5-6 months, before if needed.   Systolic hypertension with cerebrovascular disease Assessment & Plan: BP otherwise adequately controlled. Ideal BP goal 120's/70's Continue Benicar 20 mg daily and Amlodipine 2.5 mg daily as well as low salt diet. Eye exam is overdue.  Orders: -     Basic metabolic panel; Future  Hyperlipidemia with target LDL less than 70 Assessment & Plan: LDL 101 in 05/2023. Continue Lovastatin 20 mg daily and low fat diet. Further recommendations according to  FLP results.  Orders: -     Lipid panel; Future  Screening for AAA (abdominal aortic aneurysm) -     [facility] MEDICARE SCREENING; Future  Personal history of nicotine dependence -     [facility] MEDIC[facility] SCREENING; Future  Return in about 5 months (around 05/04/2024) for chronic problems.  I, Rolla Etienne Wierda, acting as a scribe for  Kentarius Partington [place], MD., have documented all relevant documentation on the behalf of Richard Binsfeld [place], MD, as directed by  Christin Moline [place], MD while in the presence of Katoya Amato [place], MD.   I, Leily Capek [place], MD, have reviewed all documentation for this visit. The documentation on 12/05/23 for the exam, diagnosis, procedures, and orders are all accurate and complete.  Rosemaria Inabinet G. [place], MD  [facility]. [place] office.

## 2023-12-06 LAB — BASIC METABOLIC PANEL
BUN: 9 mg/dL (ref 6–23)
CO2: 25 meq/L (ref 19–32)
Calcium: 9 mg/dL (ref 8.4–10.5)
Chloride: 106 meq/L (ref 96–112)
Creatinine, Ser: 0.97 mg/dL (ref 0.40–1.50)
GFR: 79.32 mL/min (ref >60.00)
Glucose, Bld: 89 mg/dL (ref 70–99)
Potassium: 4.1 meq/L (ref 3.5–5.1)
Sodium: 139 meq/L (ref 135–145)

## 2023-12-06 LAB — LIPID PANEL
Cholesterol: 143 mg/dL (ref 0–200)
HDL: 44.6 mg/dL (ref >39.00)
LDL Cholesterol: 80 mg/dL (ref 0–99)
NonHDL: 98.5
Total CHOL/HDL Ratio: 3
Triglycerides: 93 mg/dL (ref 0.0–149.0)
VLDL: 18.6 mg/dL (ref 0.0–40.0)

## 2023-12-08 NOTE — Assessment & Plan Note (Signed)
Problem is stable. Continue Trazodone 50 mg daily at bedtime. Try to improve sleep hygiene. Some side effects of Trazodone discussed.

## 2023-12-08 NOTE — Assessment & Plan Note (Signed)
LDL 101 in 05/2023. Continue Lovastatin 20 mg daily and low fat diet. Further recommendations according to FLP results.

## 2023-12-08 NOTE — Assessment & Plan Note (Signed)
Stable, pain is adequately controlled,  On Oxycodone 10 mg bid prn, no changes today. Medication contract is current. PMP reviewed. Follow-up in 5-6 months, before if needed.

## 2023-12-08 NOTE — Assessment & Plan Note (Signed)
BP otherwise adequately controlled. Ideal BP goal 120's/70's Continue Benicar 20 mg daily and Amlodipine 2.5 mg daily as well as low salt diet. Eye exam is overdue.

## 2023-12-14 ENCOUNTER — Ambulatory Visit (INDEPENDENT_AMBULATORY_CARE_PROVIDER_SITE_OTHER): Payer: Medicare Other | Admitting: Podiatry

## 2023-12-14 ENCOUNTER — Ambulatory Visit: Payer: Medicare Other | Admitting: Podiatry

## 2023-12-14 ENCOUNTER — Encounter: Payer: Self-pay | Admitting: Podiatry

## 2023-12-14 DIAGNOSIS — B351 Tinea unguium: Secondary | ICD-10-CM | POA: Diagnosis not present

## 2023-12-14 DIAGNOSIS — M79674 Pain in right toe(s): Secondary | ICD-10-CM

## 2023-12-14 DIAGNOSIS — M79675 Pain in left toe(s): Secondary | ICD-10-CM | POA: Diagnosis not present

## 2023-12-14 DIAGNOSIS — I639 Cerebral infarction, unspecified: Secondary | ICD-10-CM | POA: Diagnosis not present

## 2023-12-14 NOTE — Progress Notes (Signed)
This patient returns to my office for at risk foot care.  This patient requires this care by a professional since this patient will be at risk due to having  CVA, PAD and hyperglycemia. He presents to the office with his wife.  This patient is unable to cut nails himself since the patient cannot reach his nails.These nails are painful walking and wearing shoes.  This patient presents for at risk foot care today.  General Appearance  Alert, conversant and in no acute stress.  Vascular  Dorsalis pedis and posterior tibial  pulses are palpable  bilaterally.  Capillary return is within normal limits  bilaterally. Temperature is within normal limits  bilaterally.  Neurologic  Senn-Weinstein monofilament wire test within normal limits  bilaterally. Muscle power within normal limits bilaterally.  Nails Thick disfigured discolored nails with subungual debris  from hallux to fifth toes bilaterally. No evidence of bacterial infection or drainage bilaterally.  Orthopedic  No limitations of motion  feet .  No crepitus or effusions noted.  Hammer toes  B/l.  Skin  normotropic skin with no porokeratosis noted bilaterally.  No signs of infections or ulcers noted.     Onychomycosis  Pain in right toes  Pain in left toes  Consent was obtained for treatment procedures.   Mechanical debridement of nails 1-5  bilaterally performed with a nail nipper.  Filed with dremel without incident.    Return office visit    10 weeks                 Told patient to return for periodic foot care and evaluation due to potential at risk complications.   Helane Gunther DPM

## 2023-12-16 ENCOUNTER — Encounter (HOSPITAL_BASED_OUTPATIENT_CLINIC_OR_DEPARTMENT_OTHER): Payer: Federal, State, Local not specified - PPO | Admitting: Physical Medicine & Rehabilitation

## 2023-12-16 ENCOUNTER — Encounter: Payer: Self-pay | Admitting: Physical Medicine & Rehabilitation

## 2023-12-16 VITALS — BP 131/55 | HR 74 | Ht 75.0 in | Wt 235.0 lb

## 2023-12-16 DIAGNOSIS — G8114 Spastic hemiplegia affecting left nondominant side: Secondary | ICD-10-CM

## 2023-12-16 DIAGNOSIS — G8110 Spastic hemiplegia affecting unspecified side: Secondary | ICD-10-CM | POA: Diagnosis not present

## 2023-12-16 DIAGNOSIS — I69154 Hemiplegia and hemiparesis following nontraumatic intracerebral hemorrhage affecting left non-dominant side: Secondary | ICD-10-CM

## 2023-12-16 MED ORDER — ONABOTULINUMTOXINA 100 UNITS IJ SOLR
200.0000 [IU] | Freq: Once | INTRAMUSCULAR | Status: AC
Start: 2023-12-16 — End: 2023-12-16
  Administered 2023-12-16: 200 [IU] via INTRAMUSCULAR

## 2023-12-16 NOTE — Progress Notes (Signed)
Botox Injection for spasticity using needle EMG guidance  Dilution: 50 Units/ml Indication: Severe spasticity which interferes with ADL,mobility and/or  hygiene and is unresponsive to medication management and other conservative care Informed consent was obtained after describing risks and benefits of the procedure with the patient. This includes bleeding, bruising, infection, excessive weakness, or medication side effects. A REMS form is on file and signed. Needle: 27g 1" needle electrode Number of units per muscle Pectoralis200  All injections were done after obtaining appropriate EMG activity and after negative drawback for blood. The patient tolerated the procedure well. Post procedure instructions were given. A followup appointment was made.    Will f/u in 4 wks to see if L shoulder pain improves post injection

## 2023-12-24 ENCOUNTER — Other Ambulatory Visit: Payer: Self-pay | Admitting: Family Medicine

## 2023-12-24 DIAGNOSIS — G894 Chronic pain syndrome: Secondary | ICD-10-CM

## 2023-12-24 DIAGNOSIS — G8929 Other chronic pain: Secondary | ICD-10-CM

## 2023-12-28 MED ORDER — OXYCODONE HCL 10 MG PO TABS
10.0000 mg | ORAL_TABLET | Freq: Two times a day (BID) | ORAL | 0 refills | Status: DC
Start: 2023-12-28 — End: 2024-01-23

## 2023-12-31 ENCOUNTER — Other Ambulatory Visit: Payer: Self-pay | Admitting: Family Medicine

## 2023-12-31 DIAGNOSIS — G894 Chronic pain syndrome: Secondary | ICD-10-CM

## 2023-12-31 DIAGNOSIS — I10 Essential (primary) hypertension: Secondary | ICD-10-CM

## 2024-01-02 DIAGNOSIS — H2513 Age-related nuclear cataract, bilateral: Secondary | ICD-10-CM | POA: Diagnosis not present

## 2024-01-12 ENCOUNTER — Ambulatory Visit: Payer: Medicare Other | Admitting: Physical Medicine & Rehabilitation

## 2024-01-18 NOTE — Progress Notes (Unsigned)
 Subjective:    Patient ID: Richard Vaughn, male    DOB: Jun 03, 1951, 73 y.o.   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  Patient has complained of left shoulder tightness for greater than 6 months.  The patient has a history of left shoulder rotator cuff repair performed by Dr. Ave Filter in September 2021  Pain Inventory Average Pain 0 Pain Right Now 0 My pain is  no pain  In the last 24 hours, has pain interfered with the following? General activity 0 Relation with others 0 Enjoyment of life 0 What TIME of day is your pain at its worst? No pain Sleep (in general) Good  Pain is worse with:  no pain Pain improves with:  no pain Relief from Meds:  na  Family History  Problem Relation Age of Onset   Hypertension Father    Stroke Father    Hypertension Mother    Liver cancer Mother    Hypertension Sister        2 other sisters has also   Stroke Sister    Stroke Sister    Hypertension Brother        2nd brother has also   Colon cancer Neg Hx    Esophageal cancer Neg Hx    Rectal cancer Neg Hx    Stomach cancer Neg Hx    Colon polyps Neg Hx    Social History   Socioeconomic History   Marital status: Married    Spouse name: Esker Dever   Number of children: 3   Years of education: Not on file   Highest education level: Some college, no degree  Occupational History   Occupation: retired    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[employer]  Tobacco Use   Smoking status: Former    Current packs/day: 0.00    Average packs/day: 0.5 packs/day for 30.0 years (15.0 ttl pk-yrs)    Types: Cigarettes    Start date: 04/15/1988    Quit date: 04/15/2018    Years since quitting: 5.7   Smokeless tobacco: Never  Vaping Use   Vaping status: Never Used  Substance and Sexual Activity   Alcohol use: No    Alcohol/week: 0.0 standard drinks of alcohol   Drug use: Never    Comment: last use 04/2018    Sexual activity: Yes  Other Topics Concern   Not on file  Social History Narrative   Not on file   Social Drivers of Health   Financial Resource Strain: Low Risk  (12/05/2023)   Overall Financial Resource Strain (CARDIA)    Difficulty of Paying Living Expenses: Not hard at all  Food Insecurity: No Food Insecurity (12/05/2023)   Hunger Vital Sign    Worried About Running Out of Food in the Last Year: Never true    Ran Out of Food in the Last Year: Never true  Transportation Needs: No Transportation Needs (12/05/2023)   PRAPARE - Administrator, Civil Service (Medical): No    Lack of Transportation (Non-Medical): No  Physical Activity: Sufficiently Active (12/05/2023)   Exercise Vital Sign    Days of Exercise per Week: 5 days    Minutes of Exercise per Session: 50 min  Stress: No Stress Concern Present (12/05/2023)   [employer] of Occupational Health - Occupational Stress Questionnaire    Feeling of Stress : Only a little  Social Connections: Moderately Isolated (12/05/2023)  Social Advertising account executive [NHANES]    Frequency of Communication with Friends and Family: Once a week    Frequency of Social Gatherings with Friends and Family: More than three times a week    Attends Religious Services: Never    Database administrator or Organizations: No    Attends Banker Meetings: Never    Marital Status: Married   Past Surgical History:  Procedure Laterality Date   CERVICAL DISC SURGERY  2012   COLONOSCOPY     LUMBAR DISC SURGERY  2005, 2010   replaced L4 and L5   SHOULDER ARTHROSCOPY WITH ROTATOR CUFF REPAIR Left 08/14/2020   Procedure: SHOULDER ARTHROSCOPY WITH ROTATOR CUFF REPAIR, SUBACROMIAL DECOMPRESSION;  Surgeon: Jones Broom, MD;  Location: [facility];  Service: Orthopedics;  Laterality: Left;   UPPER GASTROINTESTINAL ENDOSCOPY     Past Surgical History:  Procedure Laterality Date   CERVICAL DISC SURGERY  2012   COLONOSCOPY      LUMBAR DISC SURGERY  2005, 2010   replaced L4 and L5   SHOULDER ARTHROSCOPY WITH ROTATOR CUFF REPAIR Left 08/14/2020   Procedure: SHOULDER ARTHROSCOPY WITH ROTATOR CUFF REPAIR, SUBACROMIAL DECOMPRESSION;  Surgeon: Jones Broom, MD;  Location: [facility];  Service: Orthopedics;  Laterality: Left;   UPPER GASTROINTESTINAL ENDOSCOPY     Past Medical History:  Diagnosis Date   Barrett's esophagus    Chronic back pain    Depression    Gastritis    Mild   Gastroparesis    GERD (gastroesophageal reflux disease)    Headache    History of kidney stones    Hyperglycemia    Hypertension    Nephrolithiasis    Pneumonia    Covid pneumonia   Renal cyst    Sleep apnea    Stricture and stenosis of esophagus    Stroke (HCC) 07/2015   Vision abnormalities    BP (!) 141/67   Pulse 72   Ht 6\' 3"  (1.905 m)   Wt 253 lb 12.8 oz (115.1 kg)   SpO2 92%   BMI 31.72 kg/m   Opioid Risk Score:   Fall Risk Score:  `1  Depression screen PHQ 2/9     01/19/2024    1:40 PM 12/16/2023   11:56 AM 11/17/2023    3:16 PM 07/05/2023    4:28 PM 04/25/2023   11:20 AM 02/02/2023    2:34 PM 11/03/2022    3:19 PM  Depression screen PHQ 2/9  Decreased Interest 0 0 3 0 0 0 0  Down, Depressed, Hopeless 0 0 0 0 0 0 1  PHQ - 2 Score 0 0 3 0 0 0 1  Altered sleeping   0 0  0 1  Tired, decreased energy   0 0  0 0  Change in appetite   0 0  0 0  Feeling bad or failure about yourself    3 0  0 2  Trouble concentrating   0 0  0 0  Moving slowly or fidgety/restless   0 0  0 0  Suicidal thoughts   0 0  0 1  PHQ-9 Score   6 0  0 5  Difficult doing work/chores    Not difficult at all  Not difficult at all Somewhat difficult     Review of Systems  All other systems reviewed and are negative.      Objective:   Physical Exam  Left upper extremity 3 - at the deltoid bicep  tricep finger flexors and extensors he has poor motor control with episodic myoclonic jerks with attempted movements. Left shoulder has 20 degrees  of external rotation and abduction to 90 before pain forward flexion to 80 before pain and limitation.      Assessment & Plan:   1.  Shoulder stiffness tightness post stroke complicated by history of rotator cuff tear and history of rotator cuff repair which was performed after his stroke.  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  It is unlikely that he would regain much range of motion in physical therapy as he is already tried.  I do not think it is primarily a pectoralis tightness and certainly the botulinum toxin injection to the pectoralis with helpful for him. As discussed in initial eval would recommend suprascapular nerve block as the next step.  We discussed that it is not a permanent fix but rather a procedure that may relieve pain for 2 to 3 months.  This would be performed under ultrasound guidance. The patient would like to think about this before proceeding.  He or his wife will call if they decide to proceed with this We discussed other potential treatments such as shoulder range of motion under general anesthesia.  Would refer back to Dr. Ave Filter for this

## 2024-01-19 ENCOUNTER — Encounter: Payer: Medicare Other | Attending: Physical Medicine & Rehabilitation | Admitting: Physical Medicine & Rehabilitation

## 2024-01-19 ENCOUNTER — Encounter: Payer: Self-pay | Admitting: Physical Medicine & Rehabilitation

## 2024-01-19 VITALS — BP 141/67 | HR 72 | Ht 75.0 in | Wt 253.8 lb

## 2024-01-19 DIAGNOSIS — M7502 Adhesive capsulitis of left shoulder: Secondary | ICD-10-CM | POA: Diagnosis not present

## 2024-01-19 NOTE — Patient Instructions (Signed)
 Left Suprascapular nerve block Ultrasound guidance  Please call if you would like to schedule this with me

## 2024-01-23 ENCOUNTER — Other Ambulatory Visit: Payer: Self-pay | Admitting: Family Medicine

## 2024-01-23 DIAGNOSIS — G8929 Other chronic pain: Secondary | ICD-10-CM

## 2024-01-23 DIAGNOSIS — G894 Chronic pain syndrome: Secondary | ICD-10-CM

## 2024-01-24 MED ORDER — OXYCODONE HCL 10 MG PO TABS
10.0000 mg | ORAL_TABLET | Freq: Two times a day (BID) | ORAL | 0 refills | Status: DC
Start: 2024-01-24 — End: 2024-02-24

## 2024-02-22 ENCOUNTER — Encounter: Payer: Self-pay | Admitting: Podiatry

## 2024-02-22 ENCOUNTER — Ambulatory Visit (INDEPENDENT_AMBULATORY_CARE_PROVIDER_SITE_OTHER): Payer: Medicare Other | Admitting: Podiatry

## 2024-02-22 DIAGNOSIS — M79675 Pain in left toe(s): Secondary | ICD-10-CM | POA: Diagnosis not present

## 2024-02-22 DIAGNOSIS — I739 Peripheral vascular disease, unspecified: Secondary | ICD-10-CM

## 2024-02-22 DIAGNOSIS — B351 Tinea unguium: Secondary | ICD-10-CM

## 2024-02-22 DIAGNOSIS — M79674 Pain in right toe(s): Secondary | ICD-10-CM | POA: Diagnosis not present

## 2024-02-22 DIAGNOSIS — I639 Cerebral infarction, unspecified: Secondary | ICD-10-CM

## 2024-02-22 NOTE — Progress Notes (Signed)
 This patient returns to my office for at risk foot care.  This patient requires this care by a professional since this patient will be at risk due to having  CVA, PAD and hyperglycemia. He presents to the office with his wife.  This patient is unable to cut nails himself since the patient cannot reach his nails.These nails are painful walking and wearing shoes.  This patient presents for at risk foot care today.  General Appearance  Alert, conversant and in no acute stress.  Vascular  Dorsalis pedis and posterior tibial  pulses are palpable  bilaterally.  Capillary return is within normal limits  bilaterally. Temperature is within normal limits  bilaterally.  Neurologic  Senn-Weinstein monofilament wire test within normal limits  bilaterally. Muscle power within normal limits bilaterally.  Nails Thick disfigured discolored nails with subungual debris  from hallux to fifth toes bilaterally. No evidence of bacterial infection or drainage bilaterally.  Orthopedic  No limitations of motion  feet .  No crepitus or effusions noted.  Hammer toes  B/l.  Skin  normotropic skin with no porokeratosis noted bilaterally.  No signs of infections or ulcers noted.     Onychomycosis  Pain in right toes  Pain in left toes  Consent was obtained for treatment procedures.   Mechanical debridement of nails 1-5  bilaterally performed with a nail nipper.  Filed with dremel without incident.    Return office visit    10 weeks                 Told patient to return for periodic foot care and evaluation due to potential at risk complications.   Helane Gunther DPM

## 2024-02-24 ENCOUNTER — Other Ambulatory Visit: Payer: Self-pay | Admitting: Family Medicine

## 2024-02-24 DIAGNOSIS — G8929 Other chronic pain: Secondary | ICD-10-CM

## 2024-02-24 DIAGNOSIS — G894 Chronic pain syndrome: Secondary | ICD-10-CM

## 2024-02-27 MED ORDER — OXYCODONE HCL 10 MG PO TABS
10.0000 mg | ORAL_TABLET | Freq: Two times a day (BID) | ORAL | 0 refills | Status: DC
Start: 2024-02-27 — End: 2024-03-27

## 2024-02-27 NOTE — Telephone Encounter (Signed)
 Can you refill in PCP's absence?

## 2024-03-27 ENCOUNTER — Other Ambulatory Visit: Payer: Self-pay | Admitting: Family Medicine

## 2024-03-27 DIAGNOSIS — G8929 Other chronic pain: Secondary | ICD-10-CM

## 2024-03-27 DIAGNOSIS — G894 Chronic pain syndrome: Secondary | ICD-10-CM

## 2024-03-27 MED ORDER — OXYCODONE HCL 10 MG PO TABS
10.0000 mg | ORAL_TABLET | Freq: Two times a day (BID) | ORAL | 0 refills | Status: AC
Start: 2024-03-27 — End: ?

## 2024-03-28 ENCOUNTER — Other Ambulatory Visit: Payer: Self-pay | Admitting: Family Medicine

## 2024-03-28 DIAGNOSIS — G4700 Insomnia, unspecified: Secondary | ICD-10-CM

## 2024-03-28 DIAGNOSIS — I10 Essential (primary) hypertension: Secondary | ICD-10-CM

## 2024-03-28 DIAGNOSIS — I639 Cerebral infarction, unspecified: Secondary | ICD-10-CM

## 2024-03-28 DIAGNOSIS — E785 Hyperlipidemia, unspecified: Secondary | ICD-10-CM

## 2024-04-26 ENCOUNTER — Other Ambulatory Visit: Payer: Self-pay | Admitting: Family Medicine

## 2024-04-26 DIAGNOSIS — G894 Chronic pain syndrome: Secondary | ICD-10-CM

## 2024-04-26 DIAGNOSIS — G8929 Other chronic pain: Secondary | ICD-10-CM

## 2024-04-26 NOTE — Telephone Encounter (Signed)
 Copied from CRM [id number]. Topic: Clinical - Medication Refill >> Apr 26, 2024 12:10 PM Earnestine Goes B wrote: Medication: Oxycodone  HCl 10 MG TABS   Has the patient contacted their pharmacy? Yes (Agent: If no, request that the patient contact the pharmacy for the refill. If patient does not wish to contact the pharmacy document the reason why and proceed with request.) (Agent: If yes, when and what did the pharmacy advise?)  This is the patient's preferred pharmacy:    [id number] - [place], [place] - [address] AT [address] OF [facility] [address]  [place] [address] Phone: [phone] Fax: [phone]  Is this the correct pharmacy for this prescription? Yes If no, delete pharmacy and type the correct one.   Has the prescription been filled recently? Yes  Is the patient out of the medication? Yes  Has the patient been seen for an appointment in the last year OR does the patient have an upcoming appointment? Yes  Can we respond through MyChart? Yes  Agent: Please be advised that Rx refills may take up to 3 business days. We ask that you follow-up with your pharmacy.

## 2024-04-27 ENCOUNTER — Ambulatory Visit (INDEPENDENT_AMBULATORY_CARE_PROVIDER_SITE_OTHER): Payer: Medicare Other

## 2024-04-27 VITALS — Ht 75.0 in | Wt 253.0 lb

## 2024-04-27 DIAGNOSIS — Z0000 Encounter for general adult medical examination without abnormal findings: Secondary | ICD-10-CM

## 2024-04-27 MED ORDER — OXYCODONE HCL 10 MG PO TABS
10.0000 mg | ORAL_TABLET | Freq: Two times a day (BID) | ORAL | 0 refills | Status: DC
Start: 2024-04-27 — End: 2024-06-04

## 2024-04-27 NOTE — Patient Instructions (Addendum)
 [name] , Thank you for taking time out of your busy schedule to complete your Annual Wellness Visit with me. I enjoyed our conversation and look forward to speaking with you again next year. I, as well as your care team,  appreciate your ongoing commitment to your health goals. Please review the following plan we discussed and let me know if I can assist you in the future. Your Game plan/ To Do List    Referrals: If you haven't heard from the office you've been referred to, please reach out to them at the phone provided.   Follow up Visits: Next Medicare AWV with our clinical staff: 05/03/25 @ 11:20a   Have you seen your provider in the last 6 months (3 months if uncontrolled diabetes)?  Next Office Visit with your provider: 05/04/24 @ 2p  Clinician Recommendations:  Aim for 30 minutes of exercise or brisk walking, 6-8 glasses of water, and 5 servings of fruits and vegetables each day.       This is a list of the screening recommended for you and due dates:  Health Maintenance  Topic Date Due   Zoster (Shingles) Vaccine (1 of 2) Never done   Pneumococcal Vaccine for age over 74 (2 of 2 - PCV) 12/09/2022   Flu Shot  06/15/2024   DTaP/Tdap/Td vaccine (2 - Td or Tdap) 12/27/2024   Medicare Annual Wellness Visit  04/27/2025   Colon Cancer Screening  02/18/2027   Hepatitis C Screening  Completed   HPV Vaccine  Aged Out   Meningitis B Vaccine  Aged Out   COVID-19 Vaccine  Discontinued    Advanced directives: (Declined) Advance directive discussed with you today. Even though you declined this today, please call our office should you change your mind, and we can give you the proper paperwork for you to fill out. Advance Care Planning is important because it:  [x]  Makes sure you receive the medical care that is consistent with your values, goals, and preferences  [x]  It provides guidance to your family and loved ones and reduces their decisional burden about whether or not they are making  the right decisions based on your wishes.  Follow the link provided in your after visit summary or read over the paperwork we have mailed to you to help you started getting your Advance Directives in place. If you need assistance in completing these, please reach out to us  so that we can help you!  See attachments for Preventive Care and Fall Prevention Tips.

## 2024-04-27 NOTE — Progress Notes (Signed)
 Subjective:   Richard Vaughn is a 73 y.o. who presents for a Medicare Wellness preventive visit.  As a reminder, Annual Wellness Visits don't include a physical exam, and some assessments may be limited, especially if this visit is performed virtually. We may recommend an in-person follow-up visit with your provider if needed.  Visit Complete: Virtual I connected with  Richard Vaughn on 04/27/24 by a audio enabled telemedicine application and verified that I am speaking with the correct person using two identifiers.  Patient Location: Home  Provider Location: Home Office  I discussed the limitations of evaluation and management by telemedicine. The patient expressed understanding and agreed to proceed.  Vital Signs: Because this visit was a virtual/telehealth visit, some criteria may be missing or patient reported. Any vitals not documented were not able to be obtained and vitals that have been documented are patient reported.    Persons Participating in Visit: Patient.  AWV Questionnaire: No: Patient Medicare AWV questionnaire was not completed prior to this visit.  Cardiac Risk Factors include: advanced age (>67men, >14 women);male gender;hypertension     Objective:    Today's Vitals   04/27/24 1123  Weight: 253 lb (114.8 kg)  Height: 6' 3 (1.905 m)   Body mass index is 31.62 kg/m.     04/27/2024   11:30 AM 06/09/2023    3:18 AM 04/25/2023   11:25 AM 02/15/2023    2:23 PM 11/12/2022    2:38 PM 04/21/2022   10:27 AM 11/03/2021    5:47 PM  Advanced Directives  Does Patient Have a Medical Advance Directive? No No No No No No No  Would patient like information on creating a medical advance directive? No - Patient declined No - Patient declined No - Patient declined  No - Patient declined No - Patient declined No - Patient declined    Current Medications (verified) Outpatient Encounter Medications as of 04/27/2024  Medication Sig   amLODipine  (NORVASC ) 2.5 MG tablet TAKE  1 TABLET BY MOUTH AT BEDTIME.   aspirin  EC 81 MG tablet Take 1 tablet (81 mg total) by mouth daily. Swallow whole.   docusate sodium  (COLACE) 100 MG capsule Take 100 mg by mouth daily.   DULoxetine  (CYMBALTA ) 60 MG capsule TAKE 1 CAPSULE BY MOUTH EVERY DAY   esomeprazole  (NEXIUM ) 40 MG capsule TAKE 1 CAPSULE (40 MG TOTAL) BY MOUTH 2 (TWO) TIMES DAILY BEFORE A MEAL.   lovastatin  (MEVACOR ) 20 MG tablet TAKE 1 TABLET BY MOUTH EVERYDAY AT BEDTIME   Melatonin 10 MG TABS Take 1 tablet by mouth at bedtime.   olmesartan  (BENICAR ) 20 MG tablet TAKE 1 TABLET BY MOUTH EVERY DAY   Oxycodone  HCl 10 MG TABS Take 1 tablet (10 mg total) by mouth 2 (two) times daily.   traZODone  (DESYREL ) 50 MG tablet TAKE 1 TABLET BY MOUTH EVERYDAY AT BEDTIME   No facility-administered encounter medications on file as of 04/27/2024.    Allergies (verified) Patient has no known allergies.   History: Past Medical History:  Diagnosis Date   Barrett's esophagus    Chronic back pain    Depression    Gastritis    Mild   Gastroparesis    GERD (gastroesophageal reflux disease)    Headache    History of kidney stones    Hyperglycemia    Hypertension    Nephrolithiasis    Pneumonia    Covid pneumonia   Renal cyst    Sleep apnea    Stricture and  stenosis of esophagus    Stroke [facility]) 07/2015   Vision abnormalities    Past Surgical History:  Procedure Laterality Date   CERVICAL DISC SURGERY  2012   COLONOSCOPY     LUMBAR DISC SURGERY  2005, 2010   replaced L4 and L5   SHOULDER ARTHROSCOPY WITH ROTATOR CUFF REPAIR Left 08/14/2020   Procedure: SHOULDER ARTHROSCOPY WITH ROTATOR CUFF REPAIR, SUBACROMIAL DECOMPRESSION;  Surgeon: Sammye Cristal, MD;  Location: [facility];  Service: Orthopedics;  Laterality: Left;   UPPER GASTROINTESTINAL ENDOSCOPY     Family History  Problem Relation Age of Onset   Hypertension Father    Stroke Father    Hypertension Mother    Liver cancer Mother    Hypertension Sister        2  other sisters has also   Stroke Sister    Stroke Sister    Hypertension Brother        2nd brother has also   Colon cancer Neg Hx    Esophageal cancer Neg Hx    Rectal cancer Neg Hx    Stomach cancer Neg Hx    Colon polyps Neg Hx    Social History   Socioeconomic History   Marital status: Married    Spouse name: Richard Vaughn   Number of children: 3   Years of education: Not on file   Highest education level: Some college, no degree  Occupational History   Occupation: retired    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[employer]  Tobacco Use   Smoking status: Former    Current packs/day: 0.00    Average packs/day: 0.5 packs/day for 30.0 years (15.0 ttl pk-yrs)    Types: Cigarettes    Start date: 04/15/1988    Quit date: 04/15/2018    Years since quitting: 6.0   Smokeless tobacco: Never  Vaping Use   Vaping status: Never Used  Substance and Sexual Activity   Alcohol use: No    Alcohol/week: 0.0 standard drinks of alcohol   Drug use: Never    Comment: last use 04/2018   Sexual activity: Yes  Other Topics Concern   Not on file  Social History Narrative   Not on file   Social Drivers of Health   Financial Resource Strain: Low Risk  (04/27/2024)   Overall Financial Resource Strain (CARDIA)    Difficulty of Paying Living Expenses: Not hard at all  Food Insecurity: No Food Insecurity (04/27/2024)   Hunger Vital Sign    Worried About Running Out of Food in the Last Year: Never true    Ran Out of Food in the Last Year: Never true  Transportation Needs: No Transportation Needs (04/27/2024)   PRAPARE - Administrator, Civil Service (Medical): No    Lack of Transportation (Non-Medical): No  Physical Activity: Sufficiently Active (04/27/2024)   Exercise Vital Sign    Days of Exercise per Week: 5 days    Minutes of Exercise per Session: 60 min  Stress: No Stress Concern Present (04/27/2024)   [employer] of Occupational Health - Occupational Stress Questionnaire    Feeling of Stress: Not  at all  Social Connections: Moderately Isolated (04/27/2024)   Social Connection and Isolation Panel    Frequency of Communication with Friends and Family: More than three times a week    Frequency of Social Gatherings with Friends and Family: More than three times a week    Attends Religious Services: Never    Database administrator or Organizations: No  Attends Banker Meetings: Never    Marital Status: Married    Tobacco Counseling Counseling given: Not Answered    Clinical Intake:  Pre-visit preparation completed: Yes  Pain : No/denies pain     BMI - recorded: 31.62 Nutritional Status: BMI > 30  Obese Nutritional Risks: None Diabetes: No  Lab Results  Component Value Date   HGBA1C 5.4 12/09/2020   HGBA1C 5.4 02/22/2020   HGBA1C 5.3 04/15/2018     How often do you need to have someone help you when you read instructions, pamphlets, or other written materials from your doctor or pharmacy?: 1 - Never  Interpreter Needed?: No  Information entered by :: Farris Hong LPN   Activities of Daily Living     04/27/2024   11:27 AM 06/09/2023    3:18 AM  In your present state of health, do you have any difficulty performing the following activities:  Hearing? 0 0  Vision? 0 0  Difficulty concentrating or making decisions? 0 0  Walking or climbing stairs? 1 1  Comment Use a Cane   Dressing or bathing? 0 1  Doing errands, shopping? 0 0  Preparing Food and eating ? N   Using the Toilet? N   In the past six months, have you accidently leaked urine? N   Do you have problems with loss of bowel control? N   Managing your Medications? N   Managing your Finances? N   Housekeeping or managing your Housekeeping? N     Patient Care Team: [place], [name], MD as PCP - General (Family Medicine)  I have updated your Care Teams any recent Medical Services you may have received from other providers in the past year.     Assessment:   This is a routine  wellness examination for Parminder.  Hearing/Vision screen Hearing Screening - Comments:: Denies hearing difficulties   Vision Screening - Comments:: Wears rx glasses - up to date with routine eye exams with  [facility]   Goals Addressed               This Visit's Progress     Increase physical activity (pt-stated)        Remain active.       Depression Screen     04/27/2024   11:27 AM 01/19/2024    1:40 PM 12/16/2023   11:56 AM 11/17/2023    3:16 PM 07/05/2023    4:28 PM 04/25/2023   11:20 AM 02/02/2023    2:34 PM  PHQ 2/9 Scores  PHQ - 2 Score 0 0 0 3 0 0 0  PHQ- 9 Score    6 0  0    Fall Risk     04/27/2024   11:29 AM 01/19/2024    1:40 PM 12/16/2023   11:56 AM 11/17/2023    3:15 PM 07/05/2023    3:11 PM  Fall Risk   Falls in the past year? 1 0 0 1 1  Number falls in past yr: 0   0 1  Injury with Fall? 0   0 0  Risk for fall due to : Impaired balance/gait;History of fall(s) Impaired balance/gait  Impaired balance/gait   Follow up Falls evaluation completed        MEDICARE RISK AT HOME:  Medicare Risk at Home Any stairs in or around the home?: Yes If so, are there any without handrails?: No Home free of loose throw rugs in walkways, pet beds, electrical cords, etc?:  Yes Adequate lighting in your home to reduce risk of falls?: Yes Life alert?: No Use of a cane, walker or w/c?: Yes Grab bars in the bathroom?: No Shower chair or bench in shower?: Yes Elevated toilet seat or a handicapped toilet?: Yes  TIMED UP AND GO:  Was the test performed?  No  Cognitive Function: 6CIT completed    10/05/2023    1:44 PM 04/04/2023    1:19 PM 07/13/2022    1:11 PM  MMSE - Mini Mental State Exam  Orientation to time 5 5 4   Orientation to Place 5 5 5   Registration 3 3 3   Attention/ Calculation 1 0 1  Recall 3 3 1   Language- name 2 objects 2 2 2   Language- repeat 1 1 1   Language- follow 3 step command 3 3 3   Language- read & follow direction 0 1 1  Write a sentence 1 1 1    Copy design 0 1 0  Total score 24 25 22         04/27/2024   11:30 AM 04/25/2023   11:25 AM 04/21/2022   10:27 AM  6CIT Screen  What Year? 0 points 0 points 0 points  What month? 0 points 0 points 0 points  What time? 0 points 0 points 0 points  Count back from 20 0 points 0 points 0 points  Months in reverse 4 points 0 points 0 points  Repeat phrase 0 points 0 points 0 points  Total Score 4 points 0 points 0 points    Immunizations Immunization History  Administered Date(s) Administered   Fluad Quad(high Dose 65+) 10/05/2022   PFIZER(Purple Top)SARS-COV-2 Vaccination 01/05/2020, 01/29/2020   Pfizer Covid-19 Vaccine Bivalent Booster 50yrs & up 10/12/2021   Pneumococcal Polysaccharide-23 12/09/2020, 12/09/2021   Tdap 12/27/2014    Screening Tests Health Maintenance  Topic Date Due   Zoster Vaccines- Shingrix (1 of 2) Never done   Pneumococcal Vaccine: 50+ Years (2 of 2 - PCV) 12/09/2022   INFLUENZA VACCINE  06/15/2024   DTaP/Tdap/Td (2 - Td or Tdap) 12/27/2024   Medicare Annual Wellness (AWV)  04/27/2025   Colonoscopy  02/18/2027   Hepatitis C Screening  Completed   HPV VACCINES  Aged Out   Meningococcal B Vaccine  Aged Out   COVID-19 Vaccine  Discontinued    Health Maintenance  Health Maintenance Due  Topic Date Due   Zoster Vaccines- Shingrix (1 of 2) Never done   Pneumococcal Vaccine: 50+ Years (2 of 2 - PCV) 12/09/2022   Health Maintenance Items Addressed:   Additional Screening:  Vision Screening: Recommended annual ophthalmology exams for early detection of glaucoma and other disorders of the eye. Would you like a referral to an eye doctor? No    Dental Screening: Recommended annual dental exams for proper oral hygiene  Community Resource Referral / Chronic Care Management: CRR required this visit?  No   CCM required this visit?  No   Plan:    I have personally reviewed and noted the following in the patient's chart:   Medical and social  history Use of alcohol, tobacco or illicit drugs  Current medications and supplements including opioid prescriptions. Patient is not currently taking opioid prescriptions. Functional ability and status Nutritional status Physical activity Advanced directives List of other physicians Hospitalizations, surgeries, and ER visits in previous 12 months Vitals Screenings to include cognitive, depression, and falls Referrals and appointments  In addition, I have reviewed and discussed with patient certain preventive protocols, quality metrics,  and best practice recommendations. A written personalized care plan for preventive services as well as general preventive health recommendations were provided to patient.   Dewayne Ford, LPN   01/04/2541   After Visit Summary: (MyChart) Due to this being a telephonic visit, the after visit summary with patients personalized plan was offered to patient via MyChart   Notes: Nothing significant to report at this time.

## 2024-05-02 ENCOUNTER — Ambulatory Visit: Admitting: Podiatry

## 2024-05-03 ENCOUNTER — Ambulatory Visit (INDEPENDENT_AMBULATORY_CARE_PROVIDER_SITE_OTHER): Admitting: Podiatry

## 2024-05-03 DIAGNOSIS — I639 Cerebral infarction, unspecified: Secondary | ICD-10-CM

## 2024-05-03 DIAGNOSIS — B351 Tinea unguium: Secondary | ICD-10-CM

## 2024-05-03 DIAGNOSIS — M79674 Pain in right toe(s): Secondary | ICD-10-CM

## 2024-05-03 DIAGNOSIS — M79675 Pain in left toe(s): Secondary | ICD-10-CM

## 2024-05-03 NOTE — Progress Notes (Signed)
 This patient returns to my office for at risk foot care.  This patient requires this care by a professional since this patient will be at risk due to having  CVA, PAD and hyperglycemia. He presents to the office with his wife.  This patient is unable to cut nails himself since the patient cannot reach his nails.These nails are painful walking and wearing shoes.  This patient presents for at risk foot care today.  General Appearance  Alert, conversant and in no acute stress.  Vascular  Dorsalis pedis and posterior tibial  pulses are palpable  bilaterally.  Capillary return is within normal limits  bilaterally. Temperature is within normal limits  bilaterally.  Neurologic  Senn-Weinstein monofilament wire test within normal limits  bilaterally. Muscle power within normal limits bilaterally.  Nails Thick disfigured discolored nails with subungual debris  from hallux to fifth toes bilaterally. No evidence of bacterial infection or drainage bilaterally.  Orthopedic  No limitations of motion  feet .  No crepitus or effusions noted.  Hammer toes  B/l.  Skin  normotropic skin with no porokeratosis noted bilaterally.  No signs of infections or ulcers noted.     Onychomycosis  Pain in right toes  Pain in left toes  Consent was obtained for treatment procedures.   Mechanical debridement of nails 1-5  bilaterally performed with a nail nipper.  Filed with dremel without incident.    Return office visit    10 weeks                 Told patient to return for periodic foot care and evaluation due to potential at risk complications.   Helane Gunther DPM

## 2024-05-04 ENCOUNTER — Encounter (HOSPITAL_COMMUNITY): Payer: Self-pay | Admitting: Emergency Medicine

## 2024-05-04 ENCOUNTER — Inpatient Hospital Stay (HOSPITAL_COMMUNITY)
Admission: EM | Admit: 2024-05-04 | Discharge: 2024-05-08 | DRG: 871 | Disposition: A | Discharge status: Home / Self Care | Attending: Internal Medicine | Admitting: Internal Medicine

## 2024-05-04 ENCOUNTER — Emergency Department (HOSPITAL_COMMUNITY)

## 2024-05-04 ENCOUNTER — Ambulatory Visit: Payer: Medicare Other | Admitting: Family Medicine

## 2024-05-04 ENCOUNTER — Other Ambulatory Visit: Payer: Self-pay

## 2024-05-04 DIAGNOSIS — Z6832 Body mass index (BMI) 32.0-32.9, adult: Secondary | ICD-10-CM | POA: Diagnosis not present

## 2024-05-04 DIAGNOSIS — J690 Pneumonitis due to inhalation of food and vomit: Secondary | ICD-10-CM | POA: Diagnosis not present

## 2024-05-04 DIAGNOSIS — I69354 Hemiplegia and hemiparesis following cerebral infarction affecting left non-dominant side: Secondary | ICD-10-CM | POA: Diagnosis not present

## 2024-05-04 DIAGNOSIS — G4737 Central sleep apnea in conditions classified elsewhere: Secondary | ICD-10-CM | POA: Diagnosis present

## 2024-05-04 DIAGNOSIS — G8110 Spastic hemiplegia affecting unspecified side: Secondary | ICD-10-CM | POA: Diagnosis not present

## 2024-05-04 DIAGNOSIS — I69398 Other sequelae of cerebral infarction: Secondary | ICD-10-CM | POA: Diagnosis not present

## 2024-05-04 DIAGNOSIS — Z8616 Personal history of COVID-19: Secondary | ICD-10-CM

## 2024-05-04 DIAGNOSIS — G4733 Obstructive sleep apnea (adult) (pediatric): Secondary | ICD-10-CM | POA: Diagnosis present

## 2024-05-04 DIAGNOSIS — R0603 Acute respiratory distress: Secondary | ICD-10-CM | POA: Diagnosis present

## 2024-05-04 DIAGNOSIS — I739 Peripheral vascular disease, unspecified: Secondary | ICD-10-CM | POA: Diagnosis present

## 2024-05-04 DIAGNOSIS — Z8249 Family history of ischemic heart disease and other diseases of the circulatory system: Secondary | ICD-10-CM | POA: Diagnosis not present

## 2024-05-04 DIAGNOSIS — J189 Pneumonia, unspecified organism: Principal | ICD-10-CM | POA: Diagnosis present

## 2024-05-04 DIAGNOSIS — J9601 Acute respiratory failure with hypoxia: Secondary | ICD-10-CM | POA: Diagnosis present

## 2024-05-04 DIAGNOSIS — T17500A Unspecified foreign body in bronchus causing asphyxiation, initial encounter: Secondary | ICD-10-CM

## 2024-05-04 DIAGNOSIS — I10 Essential (primary) hypertension: Secondary | ICD-10-CM | POA: Diagnosis present

## 2024-05-04 DIAGNOSIS — A419 Sepsis, unspecified organism: Principal | ICD-10-CM | POA: Diagnosis present

## 2024-05-04 DIAGNOSIS — G4739 Other sleep apnea: Secondary | ICD-10-CM | POA: Diagnosis present

## 2024-05-04 DIAGNOSIS — K3184 Gastroparesis: Secondary | ICD-10-CM | POA: Diagnosis present

## 2024-05-04 DIAGNOSIS — Z79899 Other long term (current) drug therapy: Secondary | ICD-10-CM

## 2024-05-04 DIAGNOSIS — I7121 Aneurysm of the ascending aorta, without rupture: Secondary | ICD-10-CM

## 2024-05-04 DIAGNOSIS — Z87891 Personal history of nicotine dependence: Secondary | ICD-10-CM | POA: Diagnosis not present

## 2024-05-04 DIAGNOSIS — E785 Hyperlipidemia, unspecified: Secondary | ICD-10-CM | POA: Diagnosis present

## 2024-05-04 DIAGNOSIS — I6930 Unspecified sequelae of cerebral infarction: Secondary | ICD-10-CM

## 2024-05-04 DIAGNOSIS — Z823 Family history of stroke: Secondary | ICD-10-CM | POA: Diagnosis not present

## 2024-05-04 DIAGNOSIS — F330 Major depressive disorder, recurrent, mild: Secondary | ICD-10-CM | POA: Diagnosis present

## 2024-05-04 DIAGNOSIS — Z8679 Personal history of other diseases of the circulatory system: Secondary | ICD-10-CM

## 2024-05-04 DIAGNOSIS — Z7982 Long term (current) use of aspirin: Secondary | ICD-10-CM

## 2024-05-04 DIAGNOSIS — G894 Chronic pain syndrome: Secondary | ICD-10-CM | POA: Diagnosis present

## 2024-05-04 DIAGNOSIS — K219 Gastro-esophageal reflux disease without esophagitis: Secondary | ICD-10-CM | POA: Diagnosis present

## 2024-05-04 DIAGNOSIS — E66811 Obesity, class 1: Secondary | ICD-10-CM | POA: Diagnosis present

## 2024-05-04 DIAGNOSIS — Z1152 Encounter for screening for COVID-19: Secondary | ICD-10-CM | POA: Diagnosis not present

## 2024-05-04 LAB — COMPREHENSIVE METABOLIC PANEL WITH GFR
ALT: 15 U/L (ref 0–44)
AST: 14 U/L — ABNORMAL LOW (ref 15–41)
Albumin: 3.4 g/dL — ABNORMAL LOW (ref 3.5–5.0)
Alkaline Phosphatase: 60 U/L (ref 38–126)
Anion gap: 8 (ref 5–15)
BUN: 11 mg/dL (ref 8–23)
CO2: 23 mmol/L (ref 22–32)
Calcium: 8.6 mg/dL — ABNORMAL LOW (ref 8.9–10.3)
Chloride: 104 mmol/L (ref 98–111)
Creatinine, Ser: 0.98 mg/dL (ref 0.61–1.24)
GFR, Estimated: >60 mL/min (ref >60)
Glucose, Bld: 111 mg/dL — ABNORMAL HIGH (ref 70–99)
Potassium: 3.7 mmol/L (ref 3.5–5.1)
Sodium: 135 mmol/L (ref 135–145)
Total Bilirubin: 0.9 mg/dL (ref 0.0–1.2)
Total Protein: 6.6 g/dL (ref 6.5–8.1)

## 2024-05-04 LAB — URINALYSIS, W/ REFLEX TO CULTURE (INFECTION SUSPECTED)
Bacteria, UA: NONE SEEN
Glucose, UA: NEGATIVE mg/dL
Hgb urine dipstick: NEGATIVE
Ketones, ur: NEGATIVE mg/dL
Leukocytes,Ua: NEGATIVE
Nitrite: NEGATIVE
Protein, ur: NEGATIVE mg/dL
Specific Gravity, Urine: 1.026 (ref 1.005–1.030)
pH: 5 (ref 5.0–8.0)

## 2024-05-04 LAB — CBC WITH DIFFERENTIAL/PLATELET
Abs Immature Granulocytes: 0.05 10*3/uL (ref 0.00–0.07)
Basophils Absolute: 0 10*3/uL (ref 0.0–0.1)
Basophils Relative: 0 %
Eosinophils Absolute: 0 10*3/uL (ref 0.0–0.5)
Eosinophils Relative: 0 %
HCT: 40.4 % (ref 39.0–52.0)
Hemoglobin: 13.2 g/dL (ref 13.0–17.0)
Immature Granulocytes: 0 %
Lymphocytes Relative: 14 %
Lymphs Abs: 1.6 10*3/uL (ref 0.7–4.0)
MCH: 30.2 pg (ref 26.0–34.0)
MCHC: 32.7 g/dL (ref 30.0–36.0)
MCV: 92.4 fL (ref 80.0–100.0)
Monocytes Absolute: 1.3 10*3/uL — ABNORMAL HIGH (ref 0.1–1.0)
Monocytes Relative: 11 %
Neutro Abs: 8.8 10*3/uL — ABNORMAL HIGH (ref 1.7–7.7)
Neutrophils Relative %: 75 %
Platelets: 183 10*3/uL (ref 150–400)
RBC: 4.37 MIL/uL (ref 4.22–5.81)
RDW: 13.2 % (ref 11.5–15.5)
WBC: 11.8 10*3/uL — ABNORMAL HIGH (ref 4.0–10.5)
nRBC: 0 % (ref 0.0–0.2)

## 2024-05-04 LAB — TROPONIN I (HIGH SENSITIVITY): Troponin I (High Sensitivity): 6 ng/L (ref <18)

## 2024-05-04 LAB — I-STAT VENOUS BLOOD GAS, ED
Acid-base deficit: 3 mmol/L — ABNORMAL HIGH (ref 0.0–2.0)
Bicarbonate: 23.4 mmol/L (ref 20.0–28.0)
Calcium, Ion: 1.17 mmol/L (ref 1.15–1.40)
HCT: 39 % (ref 39.0–52.0)
Hemoglobin: 13.3 g/dL (ref 13.0–17.0)
O2 Saturation: 98 %
Potassium: 3.8 mmol/L (ref 3.5–5.1)
Sodium: 138 mmol/L (ref 135–145)
TCO2: 25 mmol/L (ref 22–32)
pCO2, Ven: 43.8 mmHg — ABNORMAL LOW (ref 44–60)
pH, Ven: 7.336 (ref 7.25–7.43)
pO2, Ven: 107 mmHg — ABNORMAL HIGH (ref 32–45)

## 2024-05-04 LAB — PROTIME-INR
INR: 1.2 (ref 0.8–1.2)
Prothrombin Time: 15.1 s (ref 11.4–15.2)

## 2024-05-04 LAB — I-STAT CG4 LACTIC ACID, ED: Lactic Acid, Venous: 1 mmol/L (ref 0.5–1.9)

## 2024-05-04 LAB — RESP PANEL BY RT-PCR (RSV, FLU A&B, COVID)  RVPGX2
Influenza A by PCR: NEGATIVE
Influenza B by PCR: NEGATIVE
Resp Syncytial Virus by PCR: NEGATIVE
SARS Coronavirus 2 by RT PCR: NEGATIVE

## 2024-05-04 LAB — BRAIN NATRIURETIC PEPTIDE: B Natriuretic Peptide: 15.7 pg/mL (ref 0.0–100.0)

## 2024-05-04 MED ORDER — POLYETHYLENE GLYCOL 3350 17 G PO PACK: 17.0000 g | PACK | Freq: Every day | ORAL | Status: DC | PRN

## 2024-05-04 MED ORDER — SODIUM CHLORIDE 0.9% FLUSH
3.0000 mL | Freq: Two times a day (BID) | INTRAVENOUS | Status: DC
  Administered 2024-05-04 – 2024-05-08 (×7): 3 mL via INTRAVENOUS

## 2024-05-04 MED ORDER — SODIUM CHLORIDE 0.9 % IV SOLN: 2.0000 g | INTRAVENOUS | Status: DC

## 2024-05-04 MED ORDER — IPRATROPIUM-ALBUTEROL 0.5-2.5 (3) MG/3ML IN SOLN
3.0000 mL | Freq: Once | RESPIRATORY_TRACT | Status: AC
  Administered 2024-05-04: 3 mL via RESPIRATORY_TRACT
  Filled 2024-05-04: qty 3

## 2024-05-04 MED ORDER — IPRATROPIUM-ALBUTEROL 0.5-2.5 (3) MG/3ML IN SOLN
3.0000 mL | Freq: Four times a day (QID) | RESPIRATORY_TRACT | Status: DC
  Administered 2024-05-05: 3 mL via RESPIRATORY_TRACT
  Filled 2024-05-04: qty 3

## 2024-05-04 MED ORDER — GUAIFENESIN ER 600 MG PO TB12
600.0000 mg | ORAL_TABLET | Freq: Two times a day (BID) | ORAL | Status: DC
  Administered 2024-05-04 – 2024-05-08 (×8): 600 mg via ORAL
  Filled 2024-05-04 (×8): qty 1

## 2024-05-04 MED ORDER — PANTOPRAZOLE SODIUM 40 MG PO TBEC
40.0000 mg | DELAYED_RELEASE_TABLET | Freq: Every day | ORAL | Status: DC
  Administered 2024-05-05 – 2024-05-08 (×4): 40 mg via ORAL
  Filled 2024-05-04 (×4): qty 1

## 2024-05-04 MED ORDER — MELATONIN 5 MG PO TABS
10.0000 mg | ORAL_TABLET | Freq: Every evening | ORAL | Status: DC | PRN
  Administered 2024-05-04 – 2024-05-07 (×4): 10 mg via ORAL
  Filled 2024-05-04 (×4): qty 2

## 2024-05-04 MED ORDER — ENOXAPARIN SODIUM 60 MG/0.6ML IJ SOSY
55.0000 mg | PREFILLED_SYRINGE | INTRAMUSCULAR | Status: DC
  Administered 2024-05-04 – 2024-05-07 (×4): 55 mg via SUBCUTANEOUS
  Filled 2024-05-04 (×4): qty 0.6

## 2024-05-04 MED ORDER — SODIUM CHLORIDE 0.9 % IV SOLN
500.0000 mg | Freq: Once | INTRAVENOUS | Status: AC
  Administered 2024-05-04: 500 mg via INTRAVENOUS
  Filled 2024-05-04: qty 5

## 2024-05-04 MED ORDER — SODIUM CHLORIDE 0.9 % IV SOLN
2.0000 g | Freq: Once | INTRAVENOUS | Status: AC
  Administered 2024-05-04: 2 g via INTRAVENOUS
  Filled 2024-05-04: qty 20

## 2024-05-04 MED ORDER — PRAVASTATIN SODIUM 40 MG PO TABS
20.0000 mg | ORAL_TABLET | Freq: Every day | ORAL | Status: DC
  Administered 2024-05-05 – 2024-05-07 (×3): 20 mg via ORAL
  Filled 2024-05-04 (×3): qty 1

## 2024-05-04 MED ORDER — LACTATED RINGERS IV SOLN: INTRAVENOUS | Status: DC

## 2024-05-04 MED ORDER — IOHEXOL 350 MG/ML SOLN
75.0000 mL | Freq: Once | INTRAVENOUS | Status: AC | PRN
Start: 2024-05-04 — End: 2024-05-04
  Administered 2024-05-04: 75 mL via INTRAVENOUS

## 2024-05-04 MED ORDER — TRAZODONE HCL 50 MG PO TABS
50.0000 mg | ORAL_TABLET | Freq: Every day | ORAL | Status: DC
  Administered 2024-05-04 – 2024-05-07 (×4): 50 mg via ORAL
  Filled 2024-05-04 (×4): qty 1

## 2024-05-04 MED ORDER — ASPIRIN 81 MG PO TBEC
81.0000 mg | DELAYED_RELEASE_TABLET | Freq: Every day | ORAL | Status: DC
  Administered 2024-05-05 – 2024-05-08 (×4): 81 mg via ORAL
  Filled 2024-05-04 (×4): qty 1

## 2024-05-04 MED ORDER — SODIUM CHLORIDE 3 % IN NEBU
4.0000 mL | INHALATION_SOLUTION | Freq: Every day | RESPIRATORY_TRACT | Status: AC
  Administered 2024-05-04 – 2024-05-06 (×3): 4 mL via RESPIRATORY_TRACT
  Filled 2024-05-04 (×5): qty 4

## 2024-05-04 MED ORDER — HYDROCOD POLI-CHLORPHE POLI ER 10-8 MG/5ML PO SUER
5.0000 mL | Freq: Two times a day (BID) | ORAL | Status: DC | PRN
  Administered 2024-05-04 (×2): 5 mL via ORAL
  Filled 2024-05-04: qty 5

## 2024-05-04 MED ORDER — AMLODIPINE BESYLATE 5 MG PO TABS
2.5000 mg | ORAL_TABLET | Freq: Every day | ORAL | Status: DC
  Administered 2024-05-05 – 2024-05-07 (×3): 2.5 mg via ORAL
  Filled 2024-05-04 (×3): qty 1

## 2024-05-04 MED ORDER — ACETAMINOPHEN 650 MG RE SUPP: 650.0000 mg | Freq: Four times a day (QID) | RECTAL | Status: DC | PRN

## 2024-05-04 MED ORDER — ACETAMINOPHEN 325 MG PO TABS
650.0000 mg | ORAL_TABLET | Freq: Four times a day (QID) | ORAL | Status: DC | PRN
  Administered 2024-05-04 – 2024-05-05 (×2): 650 mg via ORAL
  Filled 2024-05-04 (×2): qty 2

## 2024-05-04 MED ORDER — SODIUM CHLORIDE 0.9 % IV SOLN
500.0000 mg | INTRAVENOUS | Status: DC
  Administered 2024-05-05: 500 mg via INTRAVENOUS
  Filled 2024-05-04: qty 5

## 2024-05-04 MED ORDER — DULOXETINE HCL 60 MG PO CPEP
60.0000 mg | ORAL_CAPSULE | Freq: Every day | ORAL | Status: DC
  Administered 2024-05-05 – 2024-05-08 (×4): 60 mg via ORAL
  Filled 2024-05-04 (×4): qty 1

## 2024-05-04 NOTE — ED Notes (Signed)
 CCM called for tele admit.

## 2024-05-04 NOTE — H&P (Signed)
 History and Physical   Richard Vaughn:[id number] DOB: 06-Mar-1951 DOA: 05/04/2024  PCP: [place], [name], MD   Patient coming from: Home  Chief Complaint: Cough, DOE  HPI: Richard Vaughn is a 73 y.o. male with medical history significant of hypertension, hyperlipidemia, CVA, spastic left-sided hemiplegia, PAD, carotid artery disease, GERD, Barrett's esophagus, depression, central sleep apnea, obstructive sleep apnea, chronic pain presenting with cough.  Patient presented complaining of 3 to 4 days of cough and fatigue.  He is cough has been productive of brown sputum.  He has noted dyspnea on exertion.  Yesterday noted to have a fever with some associated chills and bodyaches.  Denies chest pain, abdominal pain, constipation, diarrhea, nausea, vomiting.  ED Course: Vital signs in the ED notable for blood pressure in the 110s-130 systolic, respirate in the 20s, requiring 4 L to maintain saturations after found to be 80% on room air.  Lab workup included CMP with glucose 111, calcium  8.6, albumin 3.4.  CBC showed leukocytosis to 11.8.  PT and INR normal.  Troponin normal, BNP normal, lactic acid normal.  Respiratory panel for flu COVID and RSV negative.  Urinalysis with bilirubin only.  Blood cultures pending.  VBG with normal pH and pCO2 43.8.  Chest x-ray showed no acute abnormality.  CTA PE study showed no evidence of PE but did show right lower lobe and right middle lobe consolidation suspicious for pneumonia.  Also noted was layering airway secretions and evidence of mucous plugging.  Patient received ceftriaxone , azithromycin , IV fluids, DuoNeb in the ED.  Review of Systems: As per HPI otherwise all other systems reviewed and are negative.  Past Medical History:  Diagnosis Date   Barrett's esophagus    Cerebrovascular accident (CVA) due to thrombosis of left carotid artery (HCC) 09/23/2015   Chronic back pain    CVA (cerebral vascular accident) (HCC) 09/22/2016   Depression     Gastritis    Mild   Gastroparesis    GERD (gastroesophageal reflux disease)    Headache    History of kidney stones    Hyperglycemia    Hypertension    ICH (intracerebral hemorrhage) (HCC) - R thalmic/PLIC d/t HTN 95/62/1308   Lacunar infarct, acute (HCC) 08/25/2015   Nephrolithiasis    Pneumonia    Covid pneumonia   Renal cyst    Sleep apnea    Stricture and stenosis of esophagus    Stroke (HCC) 07/2015   Treatment-emergent central sleep apnea 10/25/2018   Vision abnormalities     Past Surgical History:  Procedure Laterality Date   CERVICAL DISC SURGERY  2012   COLONOSCOPY     LUMBAR DISC SURGERY  2005, 2010   replaced L4 and L5   SHOULDER ARTHROSCOPY WITH ROTATOR CUFF REPAIR Left 08/14/2020   Procedure: SHOULDER ARTHROSCOPY WITH ROTATOR CUFF REPAIR, SUBACROMIAL DECOMPRESSION;  Surgeon: Sammye Cristal, MD;  Location: [facility];  Service: Orthopedics;  Laterality: Left;   UPPER GASTROINTESTINAL ENDOSCOPY      Social History  reports that he quit smoking about 6 years ago. His smoking use included cigarettes. He started smoking about 36 years ago. He has a 15 pack-year smoking history. He has never used smokeless tobacco. He reports that he does not drink alcohol and does not use drugs.  No Known Allergies  Family History  Problem Relation Age of Onset   Hypertension Father    Stroke Father    Hypertension Mother    Liver cancer Mother    Hypertension Sister  2 other sisters has also   Stroke Sister    Stroke Sister    Hypertension Brother        2nd brother has also   Colon cancer Neg Hx    Esophageal cancer Neg Hx    Rectal cancer Neg Hx    Stomach cancer Neg Hx    Colon polyps Neg Hx    Reviewed on admission Prior to Admission medications   Medication Sig Start Date End Date Taking? Authorizing Provider  amLODipine  (NORVASC ) 2.5 MG tablet TAKE 1 TABLET BY MOUTH AT BEDTIME. 03/28/24   [place], [name], MD  aspirin  EC 81 MG tablet Take 1 tablet (81 mg  total) by mouth daily. Swallow whole. 06/10/20   Johny Nap, NP  docusate sodium  (COLACE) 100 MG capsule Take 100 mg by mouth daily.    [provider]  DULoxetine  (CYMBALTA ) 60 MG capsule TAKE 1 CAPSULE BY MOUTH EVERY DAY 01/02/24   [place], [name], MD  esomeprazole  (NEXIUM ) 40 MG capsule TAKE 1 CAPSULE (40 MG TOTAL) BY MOUTH 2 (TWO) TIMES DAILY BEFORE A MEAL. 07/27/23   Edmonia Gottron, PA-C  lovastatin  (MEVACOR ) 20 MG tablet TAKE 1 TABLET BY MOUTH EVERYDAY AT BEDTIME 03/28/24   [place], [name], MD  Melatonin 10 MG TABS Take 1 tablet by mouth at bedtime.    [provider]  olmesartan  (BENICAR ) 20 MG tablet TAKE 1 TABLET BY MOUTH EVERY DAY 01/02/24   [place], [name], MD  Oxycodone  HCl 10 MG TABS Take 1 tablet (10 mg total) by mouth 2 (two) times daily. 04/27/24   [place], [name], MD  traZODone  (DESYREL ) 50 MG tablet TAKE 1 TABLET BY MOUTH EVERYDAY AT BEDTIME 03/28/24   [place], [name], MD    Physical Exam: Vitals:   05/04/24 0750 05/04/24 0752 05/04/24 1104 05/04/24 1355  BP:  115/80 (!) 137/94   Pulse:  88 83   Resp:  (!) 22 19   Temp:  98.6 F (37 C)  98.3 F (36.8 C)  TempSrc:    Oral  SpO2:  93% 98%   Weight: 115 kg     Height: 6' 3 (1.905 m)       Physical Exam Constitutional:      General: He is not in acute distress.    Appearance: Normal appearance.  HENT:     Head: Normocephalic and atraumatic.     Mouth/Throat:     Mouth: Mucous membranes are moist.     Pharynx: Oropharynx is clear.   Eyes:     Extraocular Movements: Extraocular movements intact.     Pupils: Pupils are equal, round, and reactive to light.    Cardiovascular:     Rate and Rhythm: Normal rate and regular rhythm.     Pulses: Normal pulses.     Heart sounds: Normal heart sounds.  Pulmonary:     Effort: Pulmonary effort is normal. No respiratory distress.     Breath sounds: Normal breath sounds.  Abdominal:     General: Bowel sounds are normal. There is no distension.      Palpations: Abdomen is soft.     Tenderness: There is no abdominal tenderness.   Musculoskeletal:        General: No swelling or deformity.   Skin:    General: Skin is warm and dry.   Neurological:     Mental Status: Mental status is at baseline.     Comments: Chronic hemiplegia    Labs on  Admission: I have personally reviewed following labs and imaging studies  CBC: Recent Labs  Lab 05/04/24 0830 05/04/24 0928  WBC 11.8*  --   NEUTROABS 8.8*  --   HGB 13.2 13.3  HCT 40.4 39.0  MCV 92.4  --   PLT 183  --     Basic Metabolic Panel: Recent Labs  Lab 05/04/24 0830 05/04/24 0928  NA 135 138  K 3.7 3.8  CL 104  --   CO2 23  --   GLUCOSE 111*  --   BUN 11  --   CREATININE 0.98  --   CALCIUM  8.6*  --     GFR: Estimated Creatinine Clearance: 95.9 mL/min (by C-G formula based on SCr of 0.98 mg/dL).  Liver Function Tests: Recent Labs  Lab 05/04/24 0830  AST 14*  ALT 15  ALKPHOS 60  BILITOT 0.9  PROT 6.6  ALBUMIN 3.4*    Urine analysis:    Component Value Date/Time   COLORURINE YELLOW 05/04/2024 0830   APPEARANCEUR CLEAR 05/04/2024 0830   LABSPEC 1.026 05/04/2024 0830   PHURINE 5.0 05/04/2024 0830   GLUCOSEU NEGATIVE 05/04/2024 0830   GLUCOSEU NEGATIVE 08/07/2013 1552   HGBUR NEGATIVE 05/04/2024 0830   BILIRUBINUR SMALL (A) 05/04/2024 0830   BILIRUBINUR n 04/22/2015 1117   KETONESUR NEGATIVE 05/04/2024 0830   PROTEINUR NEGATIVE 05/04/2024 0830   UROBILINOGEN 0.2 04/06/2022 1846   NITRITE NEGATIVE 05/04/2024 0830   LEUKOCYTESUR NEGATIVE 05/04/2024 0830    Radiological Exams on Admission: CT Angio Chest PE W and/or Wo Contrast Result Date: 05/04/2024 CLINICAL DATA:  Three day history of cough, fever, hypoxia EXAM: CT ANGIOGRAPHY CHEST WITH CONTRAST TECHNIQUE: Multidetector CT imaging of the chest was performed using the standard protocol during bolus administration of intravenous contrast. Multiplanar CT image reconstructions and MIPs were obtained  to evaluate the vascular anatomy. RADIATION DOSE REDUCTION: This exam was performed according to the departmental dose-optimization program which includes automated exposure control, adjustment of the mA and/or kV according to patient size and/or use of iterative reconstruction technique. CONTRAST:  75mL OMNIPAQUE  IOHEXOL  350 MG/ML SOLN COMPARISON:  Same day chest radiograph, CT chest dated 08/14/2008 FINDINGS: Cardiovascular: The study is adequate for the evaluation of pulmonary embolism to the level of distal segmental pulmonary arteries due to motion artifact and timing of contrast bolus. There are no filling defects in the central, lobar, or distal segmental pulmonary artery branches to suggest acute pulmonary embolism. Ascending thoracic aorta measures 4.1 x 4.1 cm (5:73). Normal heart size. No significant pericardial fluid/thickening. Coronary artery calcifications and aortic atherosclerosis. Mediastinum/Nodes: Imaged thyroid  gland without nodules meeting criteria for imaging follow-up by size. Normal esophagus. 11 mm AP window lymph node (5:59). Lungs/Pleura: Layering secretions within the trachea extending into bilateral airways with multifocal subsegmental mucous plugging. Moderate centrilobular emphysema. Right lower lobe consolidation. Irregular consolidation is also seen within the right middle lobe. Subsegmental atelectasis of the right middle lobe and left lower lobe. No pneumothorax. No pleural effusion. Upper abdomen: Normal. Musculoskeletal: No acute or abnormal lytic or blastic osseous lesions. Multilevel degenerative changes of the thoracic spine. Review of the MIP images confirms the above findings. IMPRESSION: 1. No evidence of pulmonary embolism to the level of distal segmental pulmonary arteries. 2. Right lower lobe consolidation and irregular consolidation within the right middle lobe, likely pneumonia. 3. Layering secretions within the trachea extending into bilateral airways with multifocal  subsegmental mucous plugging. 4. Enlarged AP window lymph node, likely reactive. 5. Ascending thoracic aorta measures  4.1 cm. Recommend annual imaging followup by CTA or MRA. This recommendation follows 2010 ACCF/AHA/AATS/ACR/ASA/SCA/SCAI/SIR/STS/SVM Guidelines for the Diagnosis and Management of Patients with Thoracic Aortic Disease. Circulation. 2010; 121: J191-Y782. Aortic aneurysm NOS (ICD10-I71.9) 6. Aortic Atherosclerosis (ICD10-I70.0) and Emphysema (ICD10-J43.9). Coronary artery calcifications. Assessment for potential risk factor modification, dietary therapy or pharmacologic therapy may be warranted, if clinically indicated. Electronically Signed   By: Limin  Xu M.D.   On: 05/04/2024 13:36   DG Chest Port 1 View Result Date: 05/04/2024 CLINICAL DATA:  Questionable sepsis - evaluate for abnormality EXAM: PORTABLE CHEST - 1 VIEW COMPARISON:  June 23, 2020 FINDINGS: Subsegmental atelectasis in the right lung base. No focal airspace consolidation, pleural effusion, or pneumothorax. Borderline cardiomegaly. Aortic atherosclerosis. No acute fracture or destructive lesion. Multilevel thoracic osteophytosis. IMPRESSION: No acute cardiopulmonary abnormality. Electronically Signed   By: Rance Burrows M.D.   On: 05/04/2024 08:38   EKG: Independently reviewed.  Sinus rhythm at 85 bpm.  Nonspecific T wave changes.  Minimal baseline artifact.  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  Fever and chills also reported. > Found to have evidence of pneumonia on CTA PE study as well as evidence of mucous plugging. > Supported by leukocytosis to 11.8 and reported subjective fevers and chills.  No fever here thus far. > Requiring 4 L to maintain saturation. - Monitor on progressive unit overnight - Continue with ceftriaxone  and azithromycin  - Continue supplemental oxygen, wean as tolerated - Pulmonary toiletry and hypertonic saline nebs for mucous plugging - Trend fever curve and WBC - Supportive care  Hypertension - Continue home amlodipine  - Hold home olmesartan   Hyperlipidemia - Replace home lovastatin  with formulary pravastatin   History of hemorrhagic and ischemic CVA > With residual left-sided hemiparesis - Continue home ASA, statin  PAD Carotid artery disease - Continue ASA, statin  GERD History of Barrett's - Continue home PPI  Depression - Continue home duloxetine  and trazodone   OSA - Not adherent to CPAP per chart review  DVT prophylaxis: Lovenox  Code Status:   Full Family Communication:  Updated at bedside  Disposition Plan:   Patient is from:  Home  Anticipated DC to:  Home  Anticipated DC date:  2 to 5 days  Anticipated DC barriers: None  Consults called:  None Admission status:  Inpatient, progressive  Severity of Illness: The appropriate patient status for this patient is INPATIENT. Inpatient status is judged to be reasonable and necessary in order to provide the required intensity of service to ensure the patient's safety. The patient's presenting symptoms, physical exam findings, and initial radiographic and laboratory data in the context of their chronic comorbidities is felt to place them at high risk for further clinical deterioration. Furthermore, it is not anticipated that the patient will be medically stable for discharge from the hospital within 2 midnights of admission.   * I certify that at the  point of admission it is my clinical judgment that the patient will require  inpatient hospital care spanning beyond 2 midnights from the point of admission due to high intensity of service, high risk for further deterioration and high frequency of surveillance required.Johnetta Nab MD [facility]  How to contact the [facility] Attending or Consulting provider 7A - 7P or covering provider during after hours 7P -7A, for this patient?   Check the care team in [facility] and look for a) attending/consulting [facility] provider listed and b) the [facility] team listed Log into [email or web address] and use [place] [facility] universal password to access. If you do not have the password, please contact the hospital operator. Locate the [facility] provider you are looking for under [facility] and page to a number that you can be directly reached. If you still have difficulty reaching the provider, please page the [facility] (Director on Call) for the Hospitalists listed on amion for assistance.  05/04/2024, 2:18 PM

## 2024-05-04 NOTE — ED Provider Notes (Signed)
 [place] [facility] AT [facility] Provider Note  CSN: [id number] Arrival date & time: 05/04/24 4098  Chief Complaint(s) Cough  HPI Richard Vaughn is a 73 y.o. male with past medical history as below, significant for CVA with spastic hemiplegia, dystonia, hypertension, GERD, gastroparesis, depression, chronic back pain who presents to the ED with complaint of cough, dyspnea  Patient presents the ER by EMS.  He has had cough, malaise over the past 3 to 4 days.  Productive cough with brown sputum.  Exertional dyspnea.  Difficulty breathing at rest as well.  Fever yesterday w/ chills, body aches.  Reduced appetite.  Pt hypoxic with EMS in 80's%, no home O2  No chest pain abdominal pain nausea or vomiting.  No change to bowel or bladder function.  No rash.  No falls  Chronic weakness and dysarthria unchanged from prior stroke per patient  Past Medical History Past Medical History:  Diagnosis Date   Barrett's esophagus    Chronic back pain    Depression    Gastritis    Mild   Gastroparesis    GERD (gastroesophageal reflux disease)    Headache    History of kidney stones    Hyperglycemia    Hypertension    Nephrolithiasis    Pneumonia    Covid pneumonia   Renal cyst    Sleep apnea    Stricture and stenosis of esophagus    Stroke (HCC) 07/2015   Vision abnormalities    Patient Active Problem List   Diagnosis Date Noted   Spastic hemiplegia affecting nondominant side (HCC) 11/17/2023   Dystonia of extremity 11/17/2023   Adhesive capsulitis of left shoulder 11/17/2023   Altered awareness, transient 06/09/2023   Marijuana use 06/09/2023   History of intracranial hemorrhage 06/09/2023   Drug-induced constipation 06/08/2022   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) 12/09/2021   Depression, major, recurrent, mild (HCC)    Sleep disturbance    Urinary retention    Bradycardia    Essential hypertension     Temperature elevated    Left thyroid  nodule    Treatment-emergent central sleep apnea 10/25/2018   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) 10/25/2018   Chronic pain syndrome    Chronic bilateral low back pain    Benign essential HTN    History of CVA with residual deficit    Dysphagia, post-stroke    ICH (intracerebral hemorrhage) (HCC) - R thalmic/PLIC d/t HTN 11/91/4782   Insomnia 07/13/2017   PAD (peripheral artery disease) (HCC) 07/13/2017   Hyperlipidemia with target LDL less than 70 07/13/2017   Stroke-like symptom 09/22/2016   Paresthesia 09/22/2016   CVA (cerebral vascular accident) (HCC) 09/22/2016   Cerebrovascular accident (CVA) due to thrombosis of left carotid artery (HCC) 09/23/2015   Primary snoring 09/23/2015   Hypersomnia with sleep apnea 09/23/2015   Obstructive sleep apnea 09/23/2015   Lacunar infarct, acute (HCC) 08/25/2015   Sleep apnea 08/25/2015   Dysarthria    CVD (cardiovascular disease) 08/02/2015   Acute hyperglycemia 08/02/2015   Systolic hypertension with cerebrovascular disease 12/27/2014   Epistaxis 07/28/2012   NEPHROLITHIASIS 05/23/2009   BARRETTS ESOPHAGUS 02/20/2009   Gastroparesis 02/20/2009   RADICULOPATHY 10/21/2008   GERD 10/08/2008   Esophagitis 08/15/2008   ESOPHAGEAL STRICTURE 08/15/2008   GASTRITIS, ACUTE 08/15/2008   RENAL CYST 08/14/2008   HEMATURIA, MICROSCOPIC, HX OF 08/08/2008   PULMONARY NODULE 01/10/2008   Home Medication(s) Prior to Admission medications  Medication Sig Start Date End Date Taking? Authorizing Provider  amLODipine  (NORVASC ) 2.5 MG tablet TAKE 1 TABLET BY MOUTH AT BEDTIME. 03/28/24   [place], [name], MD  aspirin  EC 81 MG tablet Take 1 tablet (81 mg total) by mouth daily. Swallow whole. 06/10/20   Johny Nap, NP  docusate sodium  (COLACE) 100 MG capsule Take 100 mg by mouth daily.    [provider]  DULoxetine  (CYMBALTA ) 60 MG capsule TAKE 1 CAPSULE BY MOUTH EVERY DAY  01/02/24   [place], [name], MD  esomeprazole  (NEXIUM ) 40 MG capsule TAKE 1 CAPSULE (40 MG TOTAL) BY MOUTH 2 (TWO) TIMES DAILY BEFORE A MEAL. 07/27/23   Edmonia Gottron, PA-C  lovastatin  (MEVACOR ) 20 MG tablet TAKE 1 TABLET BY MOUTH EVERYDAY AT BEDTIME 03/28/24   [place], [name], MD  Melatonin 10 MG TABS Take 1 tablet by mouth at bedtime.    [provider]  olmesartan  (BENICAR ) 20 MG tablet TAKE 1 TABLET BY MOUTH EVERY DAY 01/02/24   [place], [name], MD  Oxycodone  HCl 10 MG TABS Take 1 tablet (10 mg total) by mouth 2 (two) times daily. 04/27/24   [place], [name], MD  traZODone  (DESYREL ) 50 MG tablet TAKE 1 TABLET BY MOUTH EVERYDAY AT BEDTIME 03/28/24   [place], [name], MD                                                                                                                                    Past Surgical History Past Surgical History:  Procedure Laterality Date   CERVICAL DISC SURGERY  2012   COLONOSCOPY     LUMBAR DISC SURGERY  2005, 2010   replaced L4 and L5   SHOULDER ARTHROSCOPY WITH ROTATOR CUFF REPAIR Left 08/14/2020   Procedure: SHOULDER ARTHROSCOPY WITH ROTATOR CUFF REPAIR, SUBACROMIAL DECOMPRESSION;  Surgeon: Sammye Cristal, MD;  Location: [facility];  Service: Orthopedics;  Laterality: Left;   UPPER GASTROINTESTINAL ENDOSCOPY     Family History Family History  Problem Relation Age of Onset   Hypertension Father    Stroke Father    Hypertension Mother    Liver cancer Mother    Hypertension Sister        2 other sisters has also   Stroke Sister    Stroke Sister    Hypertension Brother        2nd brother has also   Colon cancer Neg Hx    Esophageal cancer Neg Hx    Rectal cancer Neg Hx    Stomach cancer Neg Hx    Colon polyps Neg Hx     Social History Social History   Tobacco Use   Smoking status: Former    Current packs/day: 0.00    Average packs/day: 0.5 packs/day for 30.0 years (15.0 ttl pk-yrs)    Types: Cigarettes    Start date: 04/15/1988    Quit  date: 04/15/2018  Years since quitting: 6.0   Smokeless tobacco: Never  Vaping Use   Vaping status: Never Used  Substance Use Topics   Alcohol use: No    Alcohol/week: 0.0 standard drinks of alcohol   Drug use: Never    Comment: last use 04/2018   Allergies Patient has no known allergies.  Review of Systems A thorough review of systems was obtained and all systems are negative except as noted in the HPI and PMH.   Physical Exam Vital Signs  I have reviewed the triage vital signs BP (!) 137/94   Pulse 83   Temp 98.6 F (37 C)   Resp 19   Ht 6' 3 (1.905 m)   Wt 115 kg   SpO2 98%   BMI 31.69 kg/m  Physical Exam Vitals and nursing note reviewed.  Constitutional:      General: He is not in acute distress.    Appearance: He is well-developed.  HENT:     Head: Normocephalic and atraumatic.     Right Ear: External ear normal.     Left Ear: External ear normal.     Mouth/Throat:     Mouth: Mucous membranes are moist.   Eyes:     General: No scleral icterus.   Cardiovascular:     Rate and Rhythm: Normal rate and regular rhythm.     Pulses: Normal pulses.     Heart sounds: Normal heart sounds.  Pulmonary:     Effort: Pulmonary effort is normal. Tachypnea present. No respiratory distress.     Breath sounds: Decreased air movement present. Rhonchi present.     Comments: Coarse b/l Crackles lower Abdominal:     General: Abdomen is flat.     Palpations: Abdomen is soft.     Tenderness: There is no abdominal tenderness.   Musculoskeletal:     Cervical back: No rigidity.     Right lower leg: No edema.     Left lower leg: No edema.   Skin:    General: Skin is warm and dry.     Capillary Refill: Capillary refill takes less than 2 seconds.   Neurological:     Mental Status: He is alert and oriented to person, place, and time.     GCS: GCS eye subscore is 4. GCS verbal subscore is 5. GCS motor subscore is 6.     Cranial Nerves: Facial asymmetry present.      Sensory: Sensation is intact.     Motor: Weakness present.     Comments: Left sided facial asymmetry and dysarthria, left sided weakness   Gait testing deferred secondary to patient safety.   Psychiatric:        Mood and Affect: Mood normal.        Behavior: Behavior normal.     ED Results and Treatments Labs (all labs ordered are listed, but only abnormal results are displayed) Labs Reviewed  COMPREHENSIVE METABOLIC PANEL WITH GFR - Abnormal; Notable for the following components:      Result Value   Glucose, Bld 111 (*)    Calcium  8.6 (*)    Albumin 3.4 (*)    AST 14 (*)    All other components within normal limits  CBC WITH DIFFERENTIAL/PLATELET - Abnormal; Notable for the following components:   WBC 11.8 (*)    Neutro Abs 8.8 (*)    Monocytes Absolute 1.3 (*)    All other components within normal limits  URINALYSIS, W/ REFLEX TO CULTURE (INFECTION SUSPECTED) - Abnormal;  Notable for the following components:   Bilirubin Urine SMALL (*)    All other components within normal limits  I-STAT VENOUS BLOOD GAS, ED - Abnormal; Notable for the following components:   pCO2, Ven 43.8 (*)    pO2, Ven 107 (*)    Acid-base deficit 3.0 (*)    All other components within normal limits  RESP PANEL BY RT-PCR (RSV, FLU A&B, COVID)  RVPGX2  CULTURE, BLOOD (ROUTINE X 2)  CULTURE, BLOOD (ROUTINE X 2)  PROTIME-INR  BRAIN NATRIURETIC PEPTIDE  I-STAT CG4 LACTIC ACID, ED  TROPONIN I (HIGH SENSITIVITY)                                                                                                                          Radiology CT Angio Chest PE W and/or Wo Contrast Result Date: 05/04/2024 CLINICAL DATA:  Three day history of cough, fever, hypoxia EXAM: CT ANGIOGRAPHY CHEST WITH CONTRAST TECHNIQUE: Multidetector CT imaging of the chest was performed using the standard protocol during bolus administration of intravenous contrast. Multiplanar CT image reconstructions and MIPs were obtained to  evaluate the vascular anatomy. RADIATION DOSE REDUCTION: This exam was performed according to the departmental dose-optimization program which includes automated exposure control, adjustment of the mA and/or kV according to patient size and/or use of iterative reconstruction technique. CONTRAST:  75mL OMNIPAQUE  IOHEXOL  350 MG/ML SOLN COMPARISON:  Same day chest radiograph, CT chest dated 08/14/2008 FINDINGS: Cardiovascular: The study is adequate for the evaluation of pulmonary embolism to the level of distal segmental pulmonary arteries due to motion artifact and timing of contrast bolus. There are no filling defects in the central, lobar, or distal segmental pulmonary artery branches to suggest acute pulmonary embolism. Ascending thoracic aorta measures 4.1 x 4.1 cm (5:73). Normal heart size. No significant pericardial fluid/thickening. Coronary artery calcifications and aortic atherosclerosis. Mediastinum/Nodes: Imaged thyroid  gland without nodules meeting criteria for imaging follow-up by size. Normal esophagus. 11 mm AP window lymph node (5:59). Lungs/Pleura: Layering secretions within the trachea extending into bilateral airways with multifocal subsegmental mucous plugging. Moderate centrilobular emphysema. Right lower lobe consolidation. Irregular consolidation is also seen within the right middle lobe. Subsegmental atelectasis of the right middle lobe and left lower lobe. No pneumothorax. No pleural effusion. Upper abdomen: Normal. Musculoskeletal: No acute or abnormal lytic or blastic osseous lesions. Multilevel degenerative changes of the thoracic spine. Review of the MIP images confirms the above findings. IMPRESSION: 1. No evidence of pulmonary embolism to the level of distal segmental pulmonary arteries. 2. Right lower lobe consolidation and irregular consolidation within the right middle lobe, likely pneumonia. 3. Layering secretions within the trachea extending into bilateral airways with multifocal  subsegmental mucous plugging. 4. Enlarged AP window lymph node, likely reactive. 5. Ascending thoracic aorta measures 4.1 cm. Recommend annual imaging followup by CTA or MRA. This recommendation follows 2010 ACCF/AHA/AATS/ACR/ASA/SCA/SCAI/SIR/STS/SVM Guidelines for the Diagnosis and Management of Patients with Thoracic Aortic Disease. Circulation. 2010; 121: Z610-R604. Aortic aneurysm NOS (ICD10-I71.9)  6. Aortic Atherosclerosis (ICD10-I70.0) and Emphysema (ICD10-J43.9). Coronary artery calcifications. Assessment for potential risk factor modification, dietary therapy or pharmacologic therapy may be warranted, if clinically indicated. Electronically Signed   By: Limin  Xu M.D.   On: 05/04/2024 13:36   DG Chest Port 1 View Result Date: 05/04/2024 CLINICAL DATA:  Questionable sepsis - evaluate for abnormality EXAM: PORTABLE CHEST - 1 VIEW COMPARISON:  June 23, 2020 FINDINGS: Subsegmental atelectasis in the right lung base. No focal airspace consolidation, pleural effusion, or pneumothorax. Borderline cardiomegaly. Aortic atherosclerosis. No acute fracture or destructive lesion. Multilevel thoracic osteophytosis. IMPRESSION: No acute cardiopulmonary abnormality. Electronically Signed   By: Rance Burrows M.D.   On: 05/04/2024 08:38    Pertinent labs & imaging results that were available during my care of the patient were reviewed by me and considered in my medical decision making (see MDM for details).  Medications Ordered in ED Medications  lactated ringers  infusion (has no administration in time range)  cefTRIAXone  (ROCEPHIN ) 2 g in sodium chloride  0.9 % 100 mL IVPB (has no administration in time range)  azithromycin  (ZITHROMAX ) 500 mg in sodium chloride  0.9 % 250 mL IVPB (has no administration in time range)  iohexol  (OMNIPAQUE ) 350 MG/ML injection 75 mL (75 mLs Intravenous Contrast Given 05/04/24 1041)  ipratropium-albuterol (DUONEB) 0.5-2.5 (3) MG/3ML nebulizer solution 3 mL (3 mLs Nebulization Given  05/04/24 1230)                                                                                                                                     Procedures .Critical Care  Performed by: Richard Favors, DO Authorized by: Richard Favors, DO   Critical care provider statement:    Critical care time (minutes):  40   Critical care time was exclusive of:  Separately billable procedures and treating other patients   Critical care was necessary to treat or prevent imminent or life-threatening deterioration of the following conditions:  Sepsis   Critical care was time spent personally by me on the following activities:  Development of treatment plan with patient or surrogate, discussions with consultants, evaluation of patient's response to treatment, examination of patient, ordering and review of laboratory studies, ordering and review of radiographic studies, ordering and performing treatments and interventions, pulse oximetry, re-evaluation of patient's condition, review of old charts and obtaining history from patient or surrogate   Care discussed with: admitting provider     (including critical care time)  Medical Decision Making / ED Course    Medical Decision Making:    MIRL HILLERY is a 73 y.o. male with past medical history as below, significant for CVA with spastic hemiplegia, dystonia, hypertension, GERD, gastroparesis, depression, chronic back pain who presents to the ED with complaint of cough, dyspnea. The complaint involves an extensive differential diagnosis and also carries with it a high risk of complications and morbidity.  Serious etiology was considered. Ddx  includes but is not limited to: In my evaluation of this patient's dyspnea my DDx includes, but is not limited to, pneumonia, pulmonary embolism, pneumothorax, pulmonary edema, metabolic acidosis, asthma, COPD, cardiac cause, anemia, anxiety, etc.    Complete initial physical exam performed, notably the patient was in no  acute distress, no hypoxia on 4 L nasal cannula.    Reviewed and confirmed nursing documentation for past medical history, family history, social history.  Vital signs reviewed.     Brief summary:  73 year old male history as above here with dyspnea, cough, fever over the past 3 to 4 days. Hypoxia on room air, improved to 95% on 4 L nasal cannula, does not use oxygen at home    Clinical Course as of 05/04/24 1344  Fri May 04, 2024  1340 CT w/ Ascending thoracic aorta measures 4.1 cm [SG]  1340 Source of infection identified on CTPE, PNA. Code sepsis, abx, will admit  [SG]    Clinical Course User Index [SG] Russella Courts A, DO     CT with pneumonia, meets or sepsis criteria.  Given Rocephin  azithromycin  after blood cultures were obtained.  He still requiring 4 L nasal cannula.  Does not wear oxygen at home.  Will admit for sepsis secondary to pneumonia with acute respiratory failure.  Patient is agreeable              Additional history obtained: -Additional history obtained from na -External records from outside source obtained and reviewed including: Chart review including previous notes, labs, imaging, consultation notes including  Primary care documentation   Lab Tests: -I ordered, reviewed, and interpreted labs.   The pertinent results include:   Labs Reviewed  COMPREHENSIVE METABOLIC PANEL WITH GFR - Abnormal; Notable for the following components:      Result Value   Glucose, Bld 111 (*)    Calcium  8.6 (*)    Albumin 3.4 (*)    AST 14 (*)    All other components within normal limits  CBC WITH DIFFERENTIAL/PLATELET - Abnormal; Notable for the following components:   WBC 11.8 (*)    Neutro Abs 8.8 (*)    Monocytes Absolute 1.3 (*)    All other components within normal limits  URINALYSIS, W/ REFLEX TO CULTURE (INFECTION SUSPECTED) - Abnormal; Notable for the following components:   Bilirubin Urine SMALL (*)    All other components within normal limits   I-STAT VENOUS BLOOD GAS, ED - Abnormal; Notable for the following components:   pCO2, Ven 43.8 (*)    pO2, Ven 107 (*)    Acid-base deficit 3.0 (*)    All other components within normal limits  RESP PANEL BY RT-PCR (RSV, FLU A&B, COVID)  RVPGX2  CULTURE, BLOOD (ROUTINE X 2)  CULTURE, BLOOD (ROUTINE X 2)  PROTIME-INR  BRAIN NATRIURETIC PEPTIDE  I-STAT CG4 LACTIC ACID, ED  TROPONIN I (HIGH SENSITIVITY)    Notable for mild leukocytosis  EKG   EKG Interpretation Date/Time:  Friday May 04 2024 07:55:54 EDT Ventricular Rate:  85 PR Interval:  158 QRS Duration:  99 QT Interval:  361 QTC Calculation: 430 R Axis:   -1  Text Interpretation: Sinus rhythm Abnormal R-wave progression, early transition Confirmed by Russella Courts ([id number]) on 05/04/2024 8:21:43 AM         Imaging Studies ordered: I ordered imaging studies including chest x-ray, CT PE I independently visualized the following imaging with scope of interpretation limited to determining acute life threatening conditions related to emergency care;  findings noted above I agree with the radiologist interpretation If any imaging was obtained with contrast I closely monitored patient for any possible adverse reaction a/w contrast administration in the emergency department   Medicines ordered and prescription drug management: Meds ordered this encounter  Medications   iohexol  (OMNIPAQUE ) 350 MG/ML injection 75 mL   ipratropium-albuterol (DUONEB) 0.5-2.5 (3) MG/3ML nebulizer solution 3 mL   lactated ringers  infusion   cefTRIAXone  (ROCEPHIN ) 2 g in sodium chloride  0.9 % 100 mL IVPB    Antibiotic Indication::   CAP   azithromycin  (ZITHROMAX ) 500 mg in sodium chloride  0.9 % 250 mL IVPB    Antibiotic Indication::   CAP    -I have reviewed the patients home medicines and have made adjustments as needed   Consultations Obtained: I requested consultation with the trh,  and discussed lab and imaging findings as well as pertinent  plan   Cardiac Monitoring: The patient was maintained on a cardiac monitor.  I personally viewed and interpreted the cardiac monitored which showed an underlying rhythm of: sinus tachy > nsr Continuous pulse oximetry interpreted by myself, 95% on 4L.    Social Determinants of Health:  Diagnosis or treatment significantly limited by social determinants of health: former smoker   Reevaluation: After the interventions noted above, I reevaluated the patient and found that they have improved  Co morbidities that complicate the patient evaluation  Past Medical History:  Diagnosis Date   Barrett's esophagus    Chronic back pain    Depression    Gastritis    Mild   Gastroparesis    GERD (gastroesophageal reflux disease)    Headache    History of kidney stones    Hyperglycemia    Hypertension    Nephrolithiasis    Pneumonia    Covid pneumonia   Renal cyst    Sleep apnea    Stricture and stenosis of esophagus    Stroke (HCC) 07/2015   Vision abnormalities       Dispostion: Disposition decision including need for hospitalization was considered, and patient admitted to the hospital.    Final Clinical Impression(s) / ED Diagnoses Final diagnoses:  Community acquired pneumonia, unspecified laterality  Respiratory distress  Sepsis, due to unspecified organism, unspecified whether acute organ dysfunction present (HCC)  Aneurysm of ascending aorta without rupture (HCC)        Richard Favors, DO 05/04/24 1344

## 2024-05-04 NOTE — Sepsis Progress Note (Signed)
 Code Sepsis protocol being monitored by eLink.

## 2024-05-04 NOTE — ED Triage Notes (Signed)
 Pt to ER via EMS from home with c/o cough x 3 days.  Pt reports fever yesterday of 100.9.  PT with noted congested cough, crackles bilateral.  Pt RA sat for EMS 85%, placed on 4L and now 92-95%.

## 2024-05-05 ENCOUNTER — Encounter (HOSPITAL_COMMUNITY): Payer: Self-pay | Admitting: Internal Medicine

## 2024-05-05 DIAGNOSIS — I10 Essential (primary) hypertension: Secondary | ICD-10-CM | POA: Diagnosis not present

## 2024-05-05 DIAGNOSIS — E66811 Obesity, class 1: Secondary | ICD-10-CM | POA: Insufficient documentation

## 2024-05-05 DIAGNOSIS — T17500A Unspecified foreign body in bronchus causing asphyxiation, initial encounter: Secondary | ICD-10-CM | POA: Diagnosis not present

## 2024-05-05 DIAGNOSIS — G894 Chronic pain syndrome: Secondary | ICD-10-CM | POA: Diagnosis not present

## 2024-05-05 DIAGNOSIS — J9601 Acute respiratory failure with hypoxia: Secondary | ICD-10-CM | POA: Diagnosis not present

## 2024-05-05 DIAGNOSIS — I69354 Hemiplegia and hemiparesis following cerebral infarction affecting left non-dominant side: Secondary | ICD-10-CM

## 2024-05-05 LAB — PROCALCITONIN: Procalcitonin: <0.1 ng/mL

## 2024-05-05 LAB — COMPREHENSIVE METABOLIC PANEL WITH GFR
ALT: 15 U/L (ref 0–44)
AST: 15 U/L (ref 15–41)
Albumin: 3 g/dL — ABNORMAL LOW (ref 3.5–5.0)
Alkaline Phosphatase: 62 U/L (ref 38–126)
Anion gap: 8 (ref 5–15)
BUN: 8 mg/dL (ref 8–23)
CO2: 22 mmol/L (ref 22–32)
Calcium: 8.5 mg/dL — ABNORMAL LOW (ref 8.9–10.3)
Chloride: 107 mmol/L (ref 98–111)
Creatinine, Ser: 0.94 mg/dL (ref 0.61–1.24)
GFR, Estimated: >60 mL/min (ref >60)
Glucose, Bld: 106 mg/dL — ABNORMAL HIGH (ref 70–99)
Potassium: 3.8 mmol/L (ref 3.5–5.1)
Sodium: 137 mmol/L (ref 135–145)
Total Bilirubin: 1.1 mg/dL (ref 0.0–1.2)
Total Protein: 6.3 g/dL — ABNORMAL LOW (ref 6.5–8.1)

## 2024-05-05 LAB — CBC
HCT: 37.6 % — ABNORMAL LOW (ref 39.0–52.0)
Hemoglobin: 12.6 g/dL — ABNORMAL LOW (ref 13.0–17.0)
MCH: 30.6 pg (ref 26.0–34.0)
MCHC: 33.5 g/dL (ref 30.0–36.0)
MCV: 91.3 fL (ref 80.0–100.0)
Platelets: 172 10*3/uL (ref 150–400)
RBC: 4.12 MIL/uL — ABNORMAL LOW (ref 4.22–5.81)
RDW: 13 % (ref 11.5–15.5)
WBC: 11 10*3/uL — ABNORMAL HIGH (ref 4.0–10.5)
nRBC: 0 % (ref 0.0–0.2)

## 2024-05-05 LAB — HIV ANTIBODY (ROUTINE TESTING W REFLEX): HIV Screen 4th Generation wRfx: NONREACTIVE

## 2024-05-05 LAB — MRSA NEXT GEN BY PCR, NASAL: MRSA by PCR Next Gen: NOT DETECTED

## 2024-05-05 MED ORDER — AMOXICILLIN-POT CLAVULANATE 875-125 MG PO TABS
1.0000 | ORAL_TABLET | Freq: Two times a day (BID) | ORAL | Status: DC
  Administered 2024-05-05 – 2024-05-08 (×6): 1 via ORAL
  Filled 2024-05-05 (×7): qty 1

## 2024-05-05 MED ORDER — IPRATROPIUM-ALBUTEROL 0.5-2.5 (3) MG/3ML IN SOLN
3.0000 mL | RESPIRATORY_TRACT | Status: DC | PRN
  Administered 2024-05-05: 3 mL via RESPIRATORY_TRACT
  Filled 2024-05-05: qty 3

## 2024-05-05 NOTE — Progress Notes (Signed)
 PROGRESS NOTE    [name]  FMW:[id number] DOB: Apr 24, 1951 DOA: 05/04/2024 PCP: [place], [name], MD  Subjective: Pt seen and examined. No family at bedside. Pt denies being on a restricted diet at home. On RA. No fevers since last night. Procalcitonin today is Negative.   Hospital Course: HPI: Richard Vaughn is a 73 y.o. male with medical history significant of hypertension, hyperlipidemia, CVA, spastic left-sided hemiplegia, PAD, carotid artery disease, GERD, Barrett's esophagus, depression, central sleep apnea, obstructive sleep apnea, chronic pain presenting with cough.   Patient presented complaining of 3 to 4 days of cough and fatigue.  He is cough has been productive of brown sputum.  He has noted dyspnea on exertion.  Yesterday noted to have a fever with some associated chills and bodyaches.   Denies chest pain, abdominal pain, constipation, diarrhea, nausea, vomiting.   ED Course: Vital signs in the ED notable for blood pressure in the 110s-130 systolic, respirate in the 20s, requiring 4 L to maintain saturations after found to be 80% on room air.  Lab workup included CMP with glucose 111, calcium  8.6, albumin 3.4.  CBC showed leukocytosis to 11.8.  PT and INR normal.  Troponin normal, BNP normal, lactic acid normal.  Respiratory panel for flu COVID and RSV negative.  Urinalysis with bilirubin only.  Blood cultures pending.  VBG with normal pH and pCO2 43.8.   Chest x-ray showed no acute abnormality.  CTA PE study showed no evidence of PE but did show right lower lobe and right middle lobe consolidation suspicious for pneumonia.  Also noted was layering airway secretions and evidence of mucous plugging.   Patient received ceftriaxone , azithromycin , IV fluids, DuoNeb in the ED.  Significant Events: Admitted 05/04/2024 for acute respiratory failure with hypoxia   Admission Labs: VBG Ph 7.33, PCO2 of 43, PO2 of 107 WBC 11.8, HgB 13.2, plt 183 INR 1.2 Na 135, K 3.7, CO2 of 23,  BUN 11, Scr 0.98, glu 111 UA negative Covid/flu/rsv negative  Admission Imaging Studies: CXR No acute cardiopulmonary abnormality.  CTPA No evidence of pulmonary embolism to the level of distal segmental pulmonary arteries. 2. Right lower lobe consolidation and irregular consolidation within the right middle lobe, likely pneumonia. 3. Layering secretions within the trachea extending into bilateral airways with multifocal subsegmental mucous plugging. 4. Enlarged AP window lymph node, likely reactive.  5. Ascending thoracic aorta measures 4.1 cm. Recommend annual imaging followup by CTA or MRA  Significant Labs:   Significant Imaging Studies:   Antibiotic Therapy: Anti-infectives (From admission, onward)    Start     Dose/Rate Route Frequency Ordered Stop   05/05/24 1400  cefTRIAXone  (ROCEPHIN ) 2 g in sodium chloride  0.9 % 100 mL IVPB        2 g 200 mL/hr over 30 Minutes Intravenous Every 24 hours 05/04/24 1421 05/10/24 1359   05/05/24 1400  azithromycin  (ZITHROMAX ) 500 mg in sodium chloride  0.9 % 250 mL IVPB        500 mg 250 mL/hr over 60 Minutes Intravenous Every 24 hours 05/04/24 1421 05/10/24 1359   05/04/24 1345  cefTRIAXone  (ROCEPHIN ) 2 g in sodium chloride  0.9 % 100 mL IVPB        2 g 200 mL/hr over 30 Minutes Intravenous Once 05/04/24 1340 05/04/24 1520   05/04/24 1345  azithromycin  (ZITHROMAX ) 500 mg in sodium chloride  0.9 % 250 mL IVPB        500 mg 250 mL/hr over 60 Minutes Intravenous  Once  05/04/24 1340 05/04/24 1536       Procedures:   Consultants:     Assessment and Plan: * Acute respiratory failure with hypoxia (HCC) 05-05-2024 pt weaned quickly to RA. Pt not on home O2.  Resolved. May be due to mucus plugging.  Mucus plugging of bronchi 05-05-2024 procal is negative making pneumonia less likely. Pt however did have fever of 101.6.  pt has RLL consolidation so aspiration pneumonia is a consideration. Pt with known hx of dysarthria. Pt states he is not on  a modified diet at home. Pt may have silent aspiration. Will ask for ST consult to assess his swallowing ability. Will start percussion therapy/chest physiotherapy to help loosen mucus plugging. Pt can transfer to med/surg floor. He does not require progressive level of care. Change him over to po augmetin. Stop IV rocephin /zithromax .  History of intracranial hemorrhage 05-05-2024 stable.  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) 05-05-2024 chronic.  Essential hypertension 05-05-2024 continue norvasc . Holding ARB for now.  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  Chronic pain syndrome 05-05-2024 continue cymbalta .  Hyperlipidemia with target LDL less than 70 05-05-2024 continue pravachol   PAD (peripheral artery disease) (HCC) 05-05-2024 continue pravachol  and asa 81 mg  Obstructive sleep apnea 05-05-2024 stable.  GERD 05-05-2024 continue protonix .  Obesity, Class I, BMI 30-34.9 05-05-2024 Body mass index is 32.49 kg/m.   DVT prophylaxis:   Lovenox    Code Status: Full Code Family Communication: no family at bedside. Attempted to call pt's wife patricia at both numbers [phone], [phone]. No answer Disposition Plan: return home Reason for continuing need for hospitalization: awaiting ST consult. Transfer to med/surg floor. Change to po abx.  Objective: Vitals:   05/05/24 0726 05/05/24 0800 05/05/24 1045 05/05/24 1100  BP: 115/87 134/88 (!) 116/93 133/85  Pulse:  83 82 79  Resp:  (!) 22 14 (!) 23  Temp: 98.9 F (37.2 C)  99 F (37.2 C)   TempSrc: Oral  Oral   SpO2: 94% 95% 91% 91%  Weight:      Height:        Intake/Output Summary (Last 24 hours) at 05/05/2024 1448 Last data filed at 05/05/2024 1423 Gross per 24 hour  Intake 480 ml  Output 1175 ml  Net -695 ml   Filed Weights   05/04/24 0750  05/05/24 0342  Weight: 115 kg 117.9 kg    Examination:  Physical Exam Vitals and nursing note reviewed.  Constitutional:      General: He is not in acute distress.    Appearance: He is normal weight.     Comments: Chronically ill appearing. Older than stated age of 32  HENT:     Head: Normocephalic and atraumatic.     Nose: Nose normal.   Eyes:     General: No scleral icterus.   Cardiovascular:     Rate and Rhythm: Normal rate and regular rhythm.  Pulmonary:     Effort: Pulmonary effort is normal. No respiratory distress.     Breath sounds: Normal breath sounds. No wheezing.     Comments: Coarse BS bilaterally Abdominal:     General: There is no distension.     Palpations: Abdomen is soft.     Tenderness: There is no abdominal tenderness.   Skin:    General: Skin is warm and dry.     Capillary Refill: Capillary refill takes less than 2 seconds.  Neurological:     Mental Status: He is oriented to person, place, and time.     Comments: Mild dysarthria but completely understandable. Pace of speech is slow.    Data Reviewed: I have personally reviewed following labs and imaging studies  CBC: Recent Labs  Lab 05/04/24 0830 05/04/24 0928 05/05/24 0226  WBC 11.8*  --  11.0*  NEUTROABS 8.8*  --   --   HGB 13.2 13.3 12.6*  HCT 40.4 39.0 37.6*  MCV 92.4  --  91.3  PLT 183  --  172   Basic Metabolic Panel: Recent Labs  Lab 05/04/24 0830 05/04/24 0928 05/05/24 0226  NA 135 138 137  K 3.7 3.8 3.8  CL 104  --  107  CO2 23  --  22  GLUCOSE 111*  --  106*  BUN 11  --  8  CREATININE 0.98  --  0.94  CALCIUM  8.6*  --  8.5*   GFR: Estimated Creatinine Clearance: 101.3 mL/min (by C-G formula based on SCr of 0.94 mg/dL). Liver Function Tests: Recent Labs  Lab 05/04/24 0830 05/05/24 0226  AST 14* 15  ALT 15 15  ALKPHOS 60 62  BILITOT 0.9 1.1  PROT 6.6 6.3*  ALBUMIN 3.4* 3.0*   Coagulation Profile: Recent Labs  Lab 05/04/24 0830  INR 1.2   BNP (last  3 results) Recent Labs    05/04/24 0830  BNP 15.7   Sepsis Labs: Recent Labs  Lab 05/04/24 0928 05/05/24 0226  PROCALCITON  --  <0.10  LATICACIDVEN 1.0  --     Recent Results (from the past 240 hours)  Blood Culture (routine x 2)     Status: None (Preliminary result)   Collection Time: 05/04/24  8:07 AM   Specimen: BLOOD RIGHT FOREARM  Result Value Ref Range Status   Specimen Description BLOOD RIGHT FOREARM  Final   Special Requests   Final    BOTTLES DRAWN AEROBIC AND ANAEROBIC Blood Culture adequate volume   Culture   Final    NO GROWTH < 24 HOURS Performed at [facility], [address], [place], [place] [address]    Report Status PENDING  Incomplete  Resp panel by RT-PCR (RSV, Flu A&B, Covid) Anterior Nasal Swab     Status: None   Collection Time: 05/04/24 12:27 PM   Specimen: Anterior Nasal Swab  Result Value Ref Range Status   SARS Coronavirus 2 by RT PCR NEGATIVE NEGATIVE Final   Influenza A by PCR NEGATIVE NEGATIVE Final   Influenza B by PCR NEGATIVE NEGATIVE Final    Comment: (NOTE) The Xpert Xpress SARS-CoV-2/FLU/RSV plus assay is intended as an aid in the diagnosis of influenza from Nasopharyngeal swab specimens and should not be used as a sole basis for treatment. Nasal washings and aspirates are unacceptable for Xpert Xpress SARS-CoV-2/FLU/RSV testing.  Fact Sheet for Patients: BloggerCourse.com  Fact Sheet for Healthcare Providers: SeriousBroker.it  This test is not yet approved or cleared by the [place]  FDA and has been authorized for detection and/or diagnosis of SARS-CoV-2 by FDA under an Emergency Use Authorization (EUA). This EUA will remain in effect (meaning this test can be used) for the duration of the COVID-19 declaration under Section 564(b)(1) of the Act, 21 U.S.C. section 360bbb-3(b)(1), unless the authorization is terminated or revoked.     Resp Syncytial Virus by PCR  NEGATIVE NEGATIVE Final    Comment: (NOTE) Fact Sheet for Patients: BloggerCourse.com  Fact Sheet for Healthcare Providers: SeriousBroker.it  This test is not yet approved or cleared by the [place]  FDA and has been authorized for detection and/or diagnosis of SARS-CoV-2 by FDA under an Emergency Use Authorization (EUA). This EUA will remain in effect (meaning this test can be used) for the duration of the COVID-19 declaration under Section 564(b)(1) of the Act, 21 U.S.C. section 360bbb-3(b)(1), unless the authorization is terminated or revoked.  Performed at [facility], [address], [place], [place] [address]   Blood Culture (routine x 2)     Status: None (Preliminary result)   Collection Time: 05/04/24  2:39 PM   Specimen: BLOOD RIGHT ARM  Result Value Ref Range Status   Specimen Description BLOOD RIGHT ARM  Final   Special Requests   Final    BOTTLES DRAWN AEROBIC AND ANAEROBIC Blood Culture adequate volume   Culture   Final    NO GROWTH < 24 HOURS Performed at [facility], [address], [place], [place] [address]    Report Status PENDING  Incomplete  MRSA Next Gen by PCR, Nasal     Status: None   Collection Time: 05/05/24  5:58 AM   Specimen: Nasal Mucosa; Nasal Swab  Result Value Ref Range Status   MRSA by PCR Next Gen NOT DETECTED NOT DETECTED Final    Comment: (NOTE) The GeneXpert MRSA Assay (FDA approved for NASAL specimens only), is one component of a comprehensive MRSA colonization surveillance program. It is not intended to diagnose MRSA infection nor to guide or monitor treatment for MRSA infections. Test performance is not FDA approved in patients less than 68 years old. Performed at [facility], [address] [place], [place] [address]      Radiology Studies: CT Angio Chest PE W and/or Wo Contrast Result Date: 05/04/2024 CLINICAL DATA:  Three day history of cough, fever,  hypoxia EXAM: CT ANGIOGRAPHY CHEST WITH CONTRAST TECHNIQUE: Multidetector CT imaging of the chest was performed using the standard protocol during bolus administration of intravenous contrast. Multiplanar CT image reconstructions and MIPs were obtained to evaluate the vascular anatomy. RADIATION DOSE REDUCTION: This exam was performed according to the departmental dose-optimization program which includes automated exposure control, adjustment of the mA and/or kV according to patient size and/or use of iterative reconstruction technique. CONTRAST:  75mL OMNIPAQUE  IOHEXOL  350 MG/ML SOLN COMPARISON:  Same day chest radiograph, CT chest dated 08/14/2008 FINDINGS: Cardiovascular: The study is adequate for the evaluation of pulmonary embolism to the level of distal segmental pulmonary arteries due to motion artifact and timing of contrast bolus. There are no filling defects in the central, lobar, or distal segmental pulmonary artery branches to suggest acute pulmonary embolism. Ascending thoracic aorta measures 4.1 x 4.1 cm (5:73). Normal heart size. No significant pericardial fluid/thickening. Coronary artery calcifications and aortic atherosclerosis. Mediastinum/Nodes: Imaged thyroid  gland without nodules meeting criteria for imaging follow-up by size. Normal esophagus. 11 mm AP window lymph node (5:59). Lungs/Pleura: Layering secretions within the trachea extending into bilateral airways with multifocal subsegmental mucous plugging. Moderate centrilobular emphysema. Right lower lobe consolidation. Irregular consolidation is also seen within the right middle lobe. Subsegmental atelectasis of the right middle lobe and left lower lobe. No pneumothorax. No pleural effusion. Upper abdomen: Normal. Musculoskeletal: No acute or abnormal lytic or blastic osseous lesions. Multilevel degenerative changes of the thoracic spine. Review of the MIP images confirms the above findings. IMPRESSION: 1. No evidence of pulmonary embolism  to the level of distal segmental pulmonary arteries. 2. Right  lower lobe consolidation and irregular consolidation within the right middle lobe, likely pneumonia. 3. Layering secretions within the trachea extending into bilateral airways with multifocal subsegmental mucous plugging. 4. Enlarged AP window lymph node, likely reactive. 5. Ascending thoracic aorta measures 4.1 cm. Recommend annual imaging followup by CTA or MRA. This recommendation follows 2010 ACCF/AHA/AATS/ACR/ASA/SCA/SCAI/SIR/STS/SVM Guidelines for the Diagnosis and Management of Patients with Thoracic Aortic Disease. Circulation. 2010; 121: Z733-z630. Aortic aneurysm NOS (ICD10-I71.9) 6. Aortic Atherosclerosis (ICD10-I70.0) and Emphysema (ICD10-J43.9). Coronary artery calcifications. Assessment for potential risk factor modification, dietary therapy or pharmacologic therapy may be warranted, if clinically indicated. Electronically Signed   By: Limin  Xu M.D.   On: 05/04/2024 13:36   DG Chest Port 1 View Result Date: 05/04/2024 CLINICAL DATA:  Questionable sepsis - evaluate for abnormality EXAM: PORTABLE CHEST - 1 VIEW COMPARISON:  June 23, 2020 FINDINGS: Subsegmental atelectasis in the right lung base. No focal airspace consolidation, pleural effusion, or pneumothorax. Borderline cardiomegaly. Aortic atherosclerosis. No acute fracture or destructive lesion. Multilevel thoracic osteophytosis. IMPRESSION: No acute cardiopulmonary abnormality. Electronically Signed   By: Rogelia Myers M.D.   On: 05/04/2024 08:38    Scheduled Meds:  amLODipine   2.5 mg Oral QHS   amoxicillin -clavulanate  1 tablet Oral Q12H   aspirin  EC  81 mg Oral Daily   DULoxetine   60 mg Oral Daily   enoxaparin  (LOVENOX ) injection  55 mg Subcutaneous Q24H   guaiFENesin   600 mg Oral BID   pantoprazole   40 mg Oral Daily   pravastatin   20 mg Oral q1800   sodium chloride  flush  3 mL Intravenous Q12H   sodium chloride  HYPERTONIC  4 mL Nebulization Daily   traZODone   50  mg Oral QHS   Continuous Infusions:   LOS: 1 day   Time spent: 55 minutes  Camellia Door, DO  [facility]  05/05/2024, 2:48 PM

## 2024-05-05 NOTE — Assessment & Plan Note (Signed)
 05-05-2024 continue pravachol   05-06-2024 stable.

## 2024-05-05 NOTE — Assessment & Plan Note (Signed)
 05-05-2024 procal is negative making pneumonia less likely. Pt however did have fever of 101.6.  pt has RLL consolidation so aspiration pneumonia is a consideration. Pt with known hx of dysarthria. Pt states he is not on a modified diet at home. Pt may have silent aspiration. Will ask for ST consult to assess his swallowing ability. Will start percussion therapy/chest physiotherapy to help loosen mucus plugging. Pt can transfer to med/surg floor. He does not require progressive level of care. Change him over to po augmetin. Stop IV rocephin /zithromax .  05-04-2024 no fevers, but back on supplemental O2. Pt well known to ST. Pt with known abnormal MBS in the past with silent aspiration. Tolerating PO augmentin . Repeat CBC today. Procal was negative yesterday. Awaiting to see if percussion chest physiotherapy help him clear his secretions. Cannot go home today with new O2 requirement. He may ultimately need home O2.

## 2024-05-05 NOTE — Hospital Course (Signed)
 HPI: Richard Vaughn is a 73 y.o. male with medical history significant of hypertension, hyperlipidemia, CVA, spastic left-sided hemiplegia, PAD, carotid artery disease, GERD, Barrett's esophagus, depression, central sleep apnea, obstructive sleep apnea, chronic pain presenting with cough.   Patient presented complaining of 3 to 4 days of cough and fatigue.  He is cough has been productive of brown sputum.  He has noted dyspnea on exertion.  Yesterday noted to have a fever with some associated chills and bodyaches.   Denies chest pain, abdominal pain, constipation, diarrhea, nausea, vomiting.   ED Course: Vital signs in the ED notable for blood pressure in the 110s-130 systolic, respirate in the 20s, requiring 4 L to maintain saturations after found to be 80% on room air.  Lab workup included CMP with glucose 111, calcium  8.6, albumin 3.4.  CBC showed leukocytosis to 11.8.  PT and INR normal.  Troponin normal, BNP normal, lactic acid normal.  Respiratory panel for flu COVID and RSV negative.  Urinalysis with bilirubin only.  Blood cultures pending.  VBG with normal pH and pCO2 43.8.   Chest x-ray showed no acute abnormality.  CTA PE study showed no evidence of PE but did show right lower lobe and right middle lobe consolidation suspicious for pneumonia.  Also noted was layering airway secretions and evidence of mucous plugging.   Patient received ceftriaxone , azithromycin , IV fluids, DuoNeb in the ED.  Significant Events: Admitted 05/04/2024 for acute respiratory failure with hypoxia   Admission Labs: VBG Ph 7.33, PCO2 of 43, PO2 of 107 WBC 11.8, HgB 13.2, plt 183 INR 1.2 Na 135, K 3.7, CO2 of 23, BUN 11, Scr 0.98, glu 111 UA negative Covid/flu/rsv negative  Admission Imaging Studies: CXR No acute cardiopulmonary abnormality.  CTPA No evidence of pulmonary embolism to the level of distal segmental pulmonary arteries. 2. Right lower lobe consolidation and irregular consolidation within the  right middle lobe, likely pneumonia. 3. Layering secretions within the trachea extending into bilateral airways with multifocal subsegmental mucous plugging. 4. Enlarged AP window lymph node, likely reactive.  5. Ascending thoracic aorta measures 4.1 cm. Recommend annual imaging followup by CTA or MRA  Significant Labs:   Significant Imaging Studies:   Antibiotic Therapy: Anti-infectives (From admission, onward)    Start     Dose/Rate Route Frequency Ordered Stop   05/05/24 1400  cefTRIAXone  (ROCEPHIN ) 2 g in sodium chloride  0.9 % 100 mL IVPB        2 g 200 mL/hr over 30 Minutes Intravenous Every 24 hours 05/04/24 1421 05/10/24 1359   05/05/24 1400  azithromycin  (ZITHROMAX ) 500 mg in sodium chloride  0.9 % 250 mL IVPB        500 mg 250 mL/hr over 60 Minutes Intravenous Every 24 hours 05/04/24 1421 05/10/24 1359   05/04/24 1345  cefTRIAXone  (ROCEPHIN ) 2 g in sodium chloride  0.9 % 100 mL IVPB        2 g 200 mL/hr over 30 Minutes Intravenous Once 05/04/24 1340 05/04/24 1520   05/04/24 1345  azithromycin  (ZITHROMAX ) 500 mg in sodium chloride  0.9 % 250 mL IVPB        500 mg 250 mL/hr over 60 Minutes Intravenous  Once 05/04/24 1340 05/04/24 1536       Procedures:   Consultants:

## 2024-05-05 NOTE — Assessment & Plan Note (Signed)
 05-05-2024  stable.

## 2024-05-05 NOTE — Assessment & Plan Note (Signed)
 05-05-2024 continue protonix .  05-06-2024 stable. Continue protonix .

## 2024-05-05 NOTE — Assessment & Plan Note (Signed)
 05-05-2024 Body mass index is 32.49 kg/m.

## 2024-05-05 NOTE — Evaluation (Signed)
 Clinical/Bedside Swallow Evaluation Patient Details  Name: Richard Vaughn MRN: [id number] Date of Birth: June 16, 1951  Today's Date: 05/05/2024 Time: SLP Start Time (ACUTE ONLY): 1600 SLP Stop Time (ACUTE ONLY): 1645 SLP Time Calculation (min) (ACUTE ONLY): 45 min  Past 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) 09/23/2015   Chronic back pain    CVA (cerebral vascular accident) (HCC) 09/22/2016   Depression    Gastritis    Mild   Gastroparesis    GERD (gastroesophageal reflux disease)    Headache    History of kidney stones    Hyperglycemia    Hypertension    ICH (intracerebral hemorrhage) (HCC) - R thalmic/PLIC d/t HTN 93/98/7980   Lacunar infarct, acute (HCC) 08/25/2015   Nephrolithiasis    Pneumonia    Covid pneumonia   Renal cyst    Sleep apnea    Stricture and stenosis of esophagus    Stroke (HCC) 07/2015   Stroke-like symptom 09/22/2016   Treatment-emergent central sleep apnea 10/25/2018   Vision abnormalities    Past Surgical History:  Past Surgical History:  Procedure Laterality Date   CERVICAL DISC SURGERY  2012   COLONOSCOPY     LUMBAR DISC SURGERY  2005, 2010   replaced L4 and L5   SHOULDER ARTHROSCOPY WITH ROTATOR CUFF REPAIR Left 08/14/2020   Procedure: SHOULDER ARTHROSCOPY WITH ROTATOR CUFF REPAIR, SUBACROMIAL DECOMPRESSION;  Surgeon: Dozier Soulier, MD;  Location: [facility];  Service: Orthopedics;  Laterality: Left;   UPPER GASTROINTESTINAL ENDOSCOPY     HPI:  73 yo male adm to [facility] with shortness of breath and cough with brown sputum over last several days.  PMH + for CVA, spastic left-sided hemiplegia, GERD, Barrett's esophagus, chronic pain presenting with cough. Chest CT  Right lower lobe consolidation and irregular consolidation within the right middle lobe, likely pneumonia. 3. Layering secretions within the trachea extending into  bilateral airways with multifocal subsegmental mucous plugging.  Pt has undergone prior MBS study that showed silent aspiration of thin due to decreased adequacy of airway closure and timing, as well as penetration of nectar liquids.  He reports he continues with reflux medication use and denies coughing significantly during intake.  Had previously seen OP SLP for dysphagia/dysphonia and conducted RMST - Reports he no longer uses this EMST device.  Denies requiring heimlich manuever nor having recurrent pneumonias.  Reports poor appetite.    Assessment / Plan / Recommendation  Clinical Impression  Pt known to this SLP from prior OP MBS exam 12/01/2022 that showed mild oropharyngeal dysphagia - showing silent aspiration of thin liquids even with chin tuck and tsp boluses.  He reports he has since been consuming mostly softer/thin liquids at home and denies having any pneumonias until current event. Denies sick contact or aspiration episode prior to admission.       He continues with clinical indications of oral difficulties likely with prolonged oral transiting with occasional multiple swallows.  He did not pass [facility] swallow screen due to needing to stop during screen and also coughing post-swallow of approximately 1.5 ounces.  In addition, congested cough noted during session but pt unfortunately is not able to expel secretions.  Adequate oral clearance of solids and liquids noted but he does demonstrate reflexive cough - Given baseline cough, can not differentiate cough related to illness, effort or aspiration.      Recommend continue diet  and have pt drink THIN WATER with his meals.  Start intake with liquids.  Stop intake if short of breath or reflexively coughing.    SLP will follow up to speak to pt's wife and determine if she/pt would desire repeat MBS during hospital coarse (pt reports he has not had a BM in a week - when he normally has one every other day or up to 3 days.      Of note, Pt has used  thickener in the past (that his wife purchased for him) but disliked it and thus it was discontinued due to hydration concerns. SLP Visit Diagnosis: Dysphagia, unspecified (R13.10);Dysphagia, oropharyngeal phase (R13.12)    Aspiration Risk  Mild aspiration risk;Moderate aspiration risk    Diet Recommendation Regular;Thin liquid (WATER with meals)    Liquid Administration via: Cup;No straw (per pt) Medication Administration: Whole meds with puree (per pt) Supervision: Patient able to self feed;Intermittent supervision to cue for compensatory strategies Compensations: Small sips/bites;Slow rate;Other (Comment) Postural Changes: Seated upright at 90 degrees;Remain upright for at least 30 minutes after po intake    Other  Recommendations Oral Care Recommendations: Oral care BID     Assistance Recommended at Discharge    Functional Status Assessment Patient has had a recent decline in their functional status and demonstrates the ability to make significant improvements in function in a reasonable and predictable amount of time.  Frequency and Duration min 1 x/week  1 week       Prognosis Prognosis for improved oropharyngeal function: Good Barriers to Reach Goals: Time post onset      Swallow Study   General Date of Onset: 05/05/24 HPI: 73 yo male adm to [facility] with shortness of breath and cough with brown sputum over last several days.  PMH + for CVA, spastic left-sided hemiplegia, GERD, Barrett's esophagus, chronic pain presenting with cough. Chest CT  Right lower lobe consolidation and irregular consolidation within the right middle lobe, likely pneumonia. 3. Layering secretions within the trachea extending into bilateral airways with multifocal subsegmental mucous plugging.  Pt has undergone prior MBS study that showed silent aspiration of thin due to decreased adequacy of airway closure and timing, as well as penetration of nectar liquids.  He reports he continues with reflux medication  use and denies coughing significantly during intake.  Had previously seen OP SLP for dysphagia/dysphonia and conducted RMST - Reports he no longer uses this EMST device.  Denies requiring heimlich manuever nor having recurrent pneumonias.  Reports poor appetite. Type of Study: Bedside Swallow Evaluation Diet Prior to this Study: Regular;Thin liquids (Level 0) Temperature Spikes Noted: No Respiratory Status: Nasal cannula (10 liters - oxygen was off when went into room, pt said it came off) History of Recent Intubation: No Behavior/Cognition: Alert;Cooperative;Pleasant mood Oral Cavity Assessment: Within Functional Limits Oral Care Completed by SLP: No Oral Cavity - Dentition: Other (Comment);Adequate natural dentition (? implant lower) Vision: Functional for self-feeding Self-Feeding Abilities: Able to feed self Patient Positioning: Upright in bed Baseline Vocal Quality: Normal Volitional Cough: Other (Comment);Strong (not productive) Volitional Swallow: Able to elicit    Oral/Motor/Sensory Function Overall Oral Motor/Sensory Function: Other (comment) (known h/o CVA, slight right facial asymmetry, lingual deviation, bilateral palatal elevation)   Ice Chips Ice chips: Not tested   Thin Liquid Thin Liquid: Impaired Presentation: Cup;Self Fed;Straw Pharyngeal  Phase Impairments: Cough - Immediate;Cough - Delayed    Nectar Thick Nectar Thick Liquid: Not tested   Honey Thick Honey Thick Liquid: Not tested  Puree Puree: Within functional limits Presentation: Self Fed Other Comments: italian ice   Solid     Solid: Impaired Oral Phase Functional Implications: Prolonged oral transit;Impaired mastication      Nicolas Emmie Caldron 05/05/2024,5:14 PM  Madelin POUR, MS [facility] SLP Acute Rehab Services Office [phone]

## 2024-05-05 NOTE — Assessment & Plan Note (Signed)
 05-05-2024 continue pravachol  and asa 81 mg  05-06-2024 stable.

## 2024-05-05 NOTE — Assessment & Plan Note (Signed)
 05-05-2024 continue norvasc . Holding ARB for now.  05-06-2024 stable.BP stable. Continue norvasc . Continue to hold ARB.

## 2024-05-05 NOTE — Assessment & Plan Note (Signed)
 05-05-2024 pt weaned quickly to RA. Pt not on home O2.  Resolved. May be due to mucus plugging.  05-06-2024 needed to go back on supplemental O2 4 L/min. May need home O2.

## 2024-05-05 NOTE — Assessment & Plan Note (Signed)
 05-05-2024 continue cymbalta .  05-06-2024 stable. Continue cymbalta .  PDMP reviewed. Pt with consistent refills on oxycodone  but pt not complaining of any pain.

## 2024-05-05 NOTE — Plan of Care (Signed)
  Problem: Activity: Goal: Ability to tolerate increased activity will improve Outcome: Progressing   Problem: Education: Goal: Knowledge of General Education information will improve Description: Including pain rating scale, medication(s)/side effects and non-pharmacologic comfort measures Outcome: Progressing   Problem: Clinical Measurements: Goal: Respiratory complications will improve Outcome: Progressing Goal: Cardiovascular complication will be avoided Outcome: Progressing

## 2024-05-05 NOTE — Subjective & Objective (Signed)
 Pt seen and examined. No family at bedside. Pt denies being on a restricted diet at home. On RA. No fevers since last night. Procalcitonin today is Negative.

## 2024-05-05 NOTE — Progress Notes (Signed)
   05/04/24 2207  Assess: MEWS Score  Temp (!) 101.6 F (38.7 C)  BP (!) 122/91  MAP (mmHg) 98  Pulse Rate 86  ECG Heart Rate 88  Resp (!) 24  SpO2 94 %  O2 Device Nasal Cannula  O2 Flow Rate (L/min) 3 L/min  Assess: MEWS Score  MEWS Temp 2  MEWS Systolic 0  MEWS Pulse 0  MEWS RR 1  MEWS LOC 0  MEWS Score 3  MEWS Score Color Yellow  Assess: if the MEWS score is Yellow or Red  Were vital signs accurate and taken at a resting state? Yes  Does the patient meet 2 or more of the SIRS criteria? Yes  Does the patient have a confirmed or suspected source of infection? Yes  MEWS guidelines implemented  Yes, yellow  Treat  MEWS Interventions Considered administering scheduled or prn medications/treatments as ordered  Take Vital Signs  Increase Vital Sign Frequency  Yellow: Q2hr x1, continue Q4hrs until patient remains green for 12hrs  Escalate  MEWS: Escalate Yellow: Discuss with charge nurse and consider notifying provider and/or RRT  Notify: Charge Nurse/RN  Name of Charge Nurse/RN Notified Jess G., RN  Assess: SIRS CRITERIA  SIRS Temperature  1  SIRS Respirations  1  SIRS Pulse 0  SIRS WBC 0  SIRS Score Sum  2     Patient fever 101.6. Tylenol  given. Rhonchi throughout with addition of fine crackles in right lower base. RR 25. Patient appears to have a decreased cough mechanism due to stroke and is having trouble clearing what seems to be very thick phlegm. Fluids running at 150/hr. MD notified of mews change along with lung sounds (see orders). Yellow MEWs implemented. Charge nurse notified. Tylenol  given.

## 2024-05-05 NOTE — Assessment & Plan Note (Signed)
 05-05-2024 continue cymbalta   05-06-2024 stable.

## 2024-05-05 NOTE — Progress Notes (Addendum)
 BSE completed, full report to follow.   Pt alone in room, reports wife should return soon.   Pt known to this SLP from prior OP MBS exam 12/01/2022 that showed mild oropharyngeal dysphagia - showing silent aspiration of thin liquids even with chin tuck and tsp boluses.  He reports he has since been consuming mostly softer/thin liquids at home and denies having any pneumonias until current event. Denies sick contact or aspiration episode prior to admission.     He continues with clinical indications of oral difficulties likely with prolonged oral transiting with occasional multiple swallows.  He did not pass [facility] swallow screen due to needing to stop during screen and also coughing post-swallow of approximately 1.5 ounces.  In addition, congested cough noted during session but pt unfortunately is not able to expel secretions.  Adequate oral clearance of solids and liquids noted but he does demonstrate reflexive cough - Given baseline cough, can not differentiate cough related to illness, effort or aspiration.    Recommend continue diet and have pt drink THIN WATER with his meals.  Start intake with liquids.  Stop intake if short of breath or reflexively coughing.    SLP will follow up to speak to pt's wife and determine if she/pt would desire repeat MBS during hospital coarse (pt reports he has not had a BM in a week - when he normally has one every other day or up to 3 days.    Of note, Pt has used thickener in the past (that his wife purchased for him) but disliked it and thus it was discontinued due to hydration concerns.    Madelin POUR, MS [facility] SLP Acute [employer] [phone]

## 2024-05-05 NOTE — Assessment & Plan Note (Signed)
 05-05-2024 chronic.  05-06-2024 chronic. Awaiting PT/OT consult

## 2024-05-06 DIAGNOSIS — I69398 Other sequelae of cerebral infarction: Secondary | ICD-10-CM | POA: Diagnosis not present

## 2024-05-06 DIAGNOSIS — I10 Essential (primary) hypertension: Secondary | ICD-10-CM | POA: Diagnosis not present

## 2024-05-06 DIAGNOSIS — T17500A Unspecified foreign body in bronchus causing asphyxiation, initial encounter: Secondary | ICD-10-CM | POA: Diagnosis not present

## 2024-05-06 DIAGNOSIS — J9601 Acute respiratory failure with hypoxia: Secondary | ICD-10-CM | POA: Diagnosis not present

## 2024-05-06 LAB — CBC WITH DIFFERENTIAL/PLATELET
Abs Immature Granulocytes: 0.01 10*3/uL (ref 0.00–0.07)
Basophils Absolute: 0 10*3/uL (ref 0.0–0.1)
Basophils Relative: 0 %
Eosinophils Absolute: 0.1 10*3/uL (ref 0.0–0.5)
Eosinophils Relative: 1 %
HCT: 41.7 % (ref 39.0–52.0)
Hemoglobin: 14.2 g/dL (ref 13.0–17.0)
Immature Granulocytes: 0 %
Lymphocytes Relative: 17 %
Lymphs Abs: 1.4 10*3/uL (ref 0.7–4.0)
MCH: 30.9 pg (ref 26.0–34.0)
MCHC: 34.1 g/dL (ref 30.0–36.0)
MCV: 90.7 fL (ref 80.0–100.0)
Monocytes Absolute: 1 10*3/uL (ref 0.1–1.0)
Monocytes Relative: 12 %
Neutro Abs: 5.9 10*3/uL (ref 1.7–7.7)
Neutrophils Relative %: 70 %
Platelets: 218 10*3/uL (ref 150–400)
RBC: 4.6 MIL/uL (ref 4.22–5.81)
RDW: 13 % (ref 11.5–15.5)
WBC: 8.5 10*3/uL (ref 4.0–10.5)
nRBC: 0 % (ref 0.0–0.2)

## 2024-05-06 NOTE — Progress Notes (Signed)
 PROGRESS NOTE    Richard Vaughn  FMW:[id number] DOB: 03-22-1951 DOA: 05/04/2024 PCP: [place], [name], MD  Subjective: Pt seen and examined. No family at bedside.   Spoke with PT. Pt RA O2 sats in the 87% this AM. With ambulation on 4 L/min, sats 89%.   Hospital Course: HPI: Richard Vaughn is a 73 y.o. male with medical history significant of hypertension, hyperlipidemia, CVA, spastic left-sided hemiplegia, PAD, carotid artery disease, GERD, Barrett's esophagus, depression, central sleep apnea, obstructive sleep apnea, chronic pain presenting with cough.   Patient presented complaining of 3 to 4 days of cough and fatigue.  He is cough has been productive of brown sputum.  He has noted dyspnea on exertion.  Yesterday noted to have a fever with some associated chills and bodyaches.   Denies chest pain, abdominal pain, constipation, diarrhea, nausea, vomiting.   ED Course: Vital signs in the ED notable for blood pressure in the 110s-130 systolic, respirate in the 20s, requiring 4 L to maintain saturations after found to be 80% on room air.  Lab workup included CMP with glucose 111, calcium  8.6, albumin 3.4.  CBC showed leukocytosis to 11.8.  PT and INR normal.  Troponin normal, BNP normal, lactic acid normal.  Respiratory panel for flu COVID and RSV negative.  Urinalysis with bilirubin only.  Blood cultures pending.  VBG with normal pH and pCO2 43.8.   Chest x-ray showed no acute abnormality.  CTA PE study showed no evidence of PE but did show right lower lobe and right middle lobe consolidation suspicious for pneumonia.  Also noted was layering airway secretions and evidence of mucous plugging.   Patient received ceftriaxone , azithromycin , IV fluids, DuoNeb in the ED.  Significant Events: Admitted 05/04/2024 for acute respiratory failure with hypoxia   Admission Labs: VBG Ph 7.33, PCO2 of 43, PO2 of 107 WBC 11.8, HgB 13.2, plt 183 INR 1.2 Na 135, K 3.7, CO2 of 23, BUN 11, Scr 0.98, glu  111 UA negative Covid/flu/rsv negative  Admission Imaging Studies: CXR No acute cardiopulmonary abnormality.  CTPA No evidence of pulmonary embolism to the level of distal segmental pulmonary arteries. 2. Right lower lobe consolidation and irregular consolidation within the right middle lobe, likely pneumonia. 3. Layering secretions within the trachea extending into bilateral airways with multifocal subsegmental mucous plugging. 4. Enlarged AP window lymph node, likely reactive.  5. Ascending thoracic aorta measures 4.1 cm. Recommend annual imaging followup by CTA or MRA  Significant Labs:   Significant Imaging Studies:   Antibiotic Therapy: Anti-infectives (From admission, onward)    Start     Dose/Rate Route Frequency Ordered Stop   05/05/24 1400  cefTRIAXone  (ROCEPHIN ) 2 g in sodium chloride  0.9 % 100 mL IVPB        2 g 200 mL/hr over 30 Minutes Intravenous Every 24 hours 05/04/24 1421 05/10/24 1359   05/05/24 1400  azithromycin  (ZITHROMAX ) 500 mg in sodium chloride  0.9 % 250 mL IVPB        500 mg 250 mL/hr over 60 Minutes Intravenous Every 24 hours 05/04/24 1421 05/10/24 1359   05/04/24 1345  cefTRIAXone  (ROCEPHIN ) 2 g in sodium chloride  0.9 % 100 mL IVPB        2 g 200 mL/hr over 30 Minutes Intravenous Once 05/04/24 1340 05/04/24 1520   05/04/24 1345  azithromycin  (ZITHROMAX ) 500 mg in sodium chloride  0.9 % 250 mL IVPB        500 mg 250 mL/hr over 60 Minutes Intravenous  Once 05/04/24 1340 05/04/24 1536       Procedures:   Consultants:     Assessment and Plan: * Acute respiratory failure with hypoxia (HCC) 05-05-2024 pt weaned quickly to RA. Pt not on home O2.  Resolved. May be due to mucus plugging.  05-06-2024 needed to go back on supplemental O2 4 L/min. May need home O2.  Mucus plugging of bronchi 05-05-2024 procal is negative making pneumonia less likely. Pt however did have fever of 101.6.  pt has RLL consolidation so aspiration pneumonia is a consideration.  Pt with known hx of dysarthria. Pt states he is not on a modified diet at home. Pt may have silent aspiration. Will ask for ST consult to assess his swallowing ability. Will start percussion therapy/chest physiotherapy to help loosen mucus plugging. Pt can transfer to med/surg floor. He does not require progressive level of care. Change him over to po augmetin. Stop IV rocephin /zithromax .  05-04-2024 no fevers, but back on supplemental O2. Pt well known to ST. Pt with known abnormal MBS in the past with silent aspiration. Tolerating PO augmentin . Repeat CBC today. Procal was negative yesterday. Awaiting to see if percussion chest physiotherapy help him clear his secretions. Cannot go home today with new O2 requirement. He may ultimately need home O2.  History of intracranial hemorrhage 05-05-2024 stable.  05-06-2024 stable.  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) 05-05-2024 chronic.  05-06-2024 chronic. Awaiting PT/OT consult  Essential hypertension 05-05-2024 continue norvasc . Holding ARB for now.  05-06-2024 stable.BP stable. Continue norvasc . Continue to hold ARB.  Depression, major, recurrent, mild (HCC) 05-05-2024 continue cymbalta   05-06-2024 stable.  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  05-06-2024 stable.  Chronic pain syndrome 05-05-2024 continue cymbalta .  05-06-2024 stable. Continue cymbalta .  PDMP reviewed. Pt with consistent refills on oxycodone  but pt not complaining of any pain.  Hyperlipidemia with target LDL less than 70 05-05-2024 continue pravachol   05-06-2024 stable.  PAD (peripheral artery disease) (HCC) 05-05-2024 continue pravachol  and asa 81 mg  05-06-2024 stable.  Obstructive sleep apnea 05-05-2024 stable.  05-06-2024 stable.  GERD 05-05-2024 continue protonix .  05-06-2024 stable. Continue protonix .  Obesity, Class I, BMI 30-34.9 05-05-2024 Body mass  index is 32.49 kg/m.    DVT prophylaxis:   Lovenox    Code Status: Full Code Family Communication: no family at bedside Disposition Plan: return home Reason for continuing need for hospitalization: remains on supplemental O2. Not on home O2.  Objective: Vitals:   05/06/24 0314 05/06/24 0715 05/06/24 0821 05/06/24 0844  BP:   (!) 111/95   Pulse: 67     Resp: 18     Temp: 98.6 F (37 C) 98.4 F (36.9 C)    TempSrc:  Oral    SpO2: 93% 90%  90%  Weight:      Height:        Intake/Output Summary (Last 24 hours) at 05/06/2024 1101 Last data filed at 05/06/2024 0955 Gross per 24 hour  Intake 1330 ml  Output 2300 ml  Net -970 ml   Filed Weights   05/04/24 0750 05/05/24 0342  Weight: 115 kg 117.9 kg    Examination:  Physical Exam Vitals and nursing note reviewed.  Constitutional:      General: He is not in acute distress.    Appearance: He is not toxic-appearing or diaphoretic.  HENT:     Head: Normocephalic and atraumatic.     Nose: Nose normal.   Eyes:  General: No scleral icterus.   Cardiovascular:     Rate and Rhythm: Normal rate and regular rhythm.  Pulmonary:     Breath sounds: Rhonchi present.     Comments: Coarse rhonchi bilaterally No distress  Pt sitting up right in bedside recliner. Abdominal:     General: Abdomen is flat. There is no distension.   Skin:    General: Skin is warm and dry.     Capillary Refill: Capillary refill takes less than 2 seconds.   Neurological:     Mental Status: He is alert and oriented to person, place, and time.     Comments: Slow speech, mild dysarthria. Stable since yesterday.    Data Reviewed: I have personally reviewed following labs and imaging studies  CBC: Recent Labs  Lab 05/04/24 0830 05/04/24 0928 05/05/24 0226  WBC 11.8*  --  11.0*  NEUTROABS 8.8*  --   --   HGB 13.2 13.3 12.6*  HCT 40.4 39.0 37.6*  MCV 92.4  --  91.3  PLT 183  --  172   Basic Metabolic Panel: Recent Labs  Lab  05/04/24 0830 05/04/24 0928 05/05/24 0226  NA 135 138 137  K 3.7 3.8 3.8  CL 104  --  107  CO2 23  --  22  GLUCOSE 111*  --  106*  BUN 11  --  8  CREATININE 0.98  --  0.94  CALCIUM  8.6*  --  8.5*   GFR: Estimated Creatinine Clearance: 101.3 mL/min (by C-G formula based on SCr of 0.94 mg/dL). Liver Function Tests: Recent Labs  Lab 05/04/24 0830 05/05/24 0226  AST 14* 15  ALT 15 15  ALKPHOS 60 62  BILITOT 0.9 1.1  PROT 6.6 6.3*  ALBUMIN 3.4* 3.0*   Coagulation Profile: Recent Labs  Lab 05/04/24 0830  INR 1.2   BNP (last 3 results) Recent Labs    05/04/24 0830  BNP 15.7   Sepsis Labs: Recent Labs  Lab 05/04/24 0928 05/05/24 0226  PROCALCITON  --  <0.10  LATICACIDVEN 1.0  --     Recent Results (from the past 240 hours)  Blood Culture (routine x 2)     Status: None (Preliminary result)   Collection Time: 05/04/24  8:07 AM   Specimen: BLOOD RIGHT FOREARM  Result Value Ref Range Status   Specimen Description BLOOD RIGHT FOREARM  Final   Special Requests   Final    BOTTLES DRAWN AEROBIC AND ANAEROBIC Blood Culture adequate volume   Culture   Final    NO GROWTH 2 DAYS Performed at [facility], [address], [place], [place] [address]    Report Status PENDING  Incomplete  Resp panel by RT-PCR (RSV, Flu A&B, Covid) Anterior Nasal Swab     Status: None   Collection Time: 05/04/24 12:27 PM   Specimen: Anterior Nasal Swab  Result Value Ref Range Status   SARS Coronavirus 2 by RT PCR NEGATIVE NEGATIVE Final   Influenza A by PCR NEGATIVE NEGATIVE Final   Influenza B by PCR NEGATIVE NEGATIVE Final    Comment: (NOTE) The Xpert Xpress SARS-CoV-2/FLU/RSV plus assay is intended as an aid in the diagnosis of influenza from Nasopharyngeal swab specimens and should not be used as a sole basis for treatment. Nasal washings and aspirates are unacceptable for Xpert Xpress SARS-CoV-2/FLU/RSV testing.  Fact Sheet for  Patients: BloggerCourse.com  Fact Sheet for Healthcare Providers: SeriousBroker.it  This test is not yet approved or cleared by the [place]  [place] FDA and has been authorized for detection and/or diagnosis of SARS-CoV-2 by FDA under an Emergency Use Authorization (EUA). This EUA will remain in effect (meaning this test can be used) for the duration of the COVID-19 declaration under Section 564(b)(1) of the Act, 21 U.S.C. section 360bbb-3(b)(1), unless the authorization is terminated or revoked.     Resp Syncytial Virus by PCR NEGATIVE NEGATIVE Final    Comment: (NOTE) Fact Sheet for Patients: BloggerCourse.com  Fact Sheet for Healthcare Providers: SeriousBroker.it  This test is not yet approved or cleared by the [place]  FDA and has been authorized for detection and/or diagnosis of SARS-CoV-2 by FDA under an Emergency Use Authorization (EUA). This EUA will remain in effect (meaning this test can be used) for the duration of the COVID-19 declaration under Section 564(b)(1) of the Act, 21 U.S.C. section 360bbb-3(b)(1), unless the authorization is terminated or revoked.  Performed at [facility], [address], [place], [place] [address]   Blood Culture (routine x 2)     Status: None (Preliminary result)   Collection Time: 05/04/24  2:39 PM   Specimen: BLOOD RIGHT ARM  Result Value Ref Range Status   Specimen Description BLOOD RIGHT ARM  Final   Special Requests   Final    BOTTLES DRAWN AEROBIC AND ANAEROBIC Blood Culture adequate volume   Culture   Final    NO GROWTH 2 DAYS Performed at [facility], [address], [place], [place] [address]    Report Status PENDING  Incomplete  MRSA Next Gen by PCR, Nasal     Status: None   Collection Time: 05/05/24  5:58 AM   Specimen: Nasal Mucosa; Nasal Swab  Result Value Ref Range Status   MRSA by PCR Next Gen NOT  DETECTED NOT DETECTED Final    Comment: (NOTE) The GeneXpert MRSA Assay (FDA approved for NASAL specimens only), is one component of a comprehensive MRSA colonization surveillance program. It is not intended to diagnose MRSA infection nor to guide or monitor treatment for MRSA infections. Test performance is not FDA approved in patients less than 47 years old. Performed at [facility], [address] [place], [place] [address]      Radiology Studies: No results found.  Scheduled Meds:  amLODipine   2.5 mg Oral QHS   amoxicillin -clavulanate  1 tablet Oral Q12H   aspirin  EC  81 mg Oral Daily   DULoxetine   60 mg Oral Daily   enoxaparin  (LOVENOX ) injection  55 mg Subcutaneous Q24H   guaiFENesin   600 mg Oral BID   pantoprazole   40 mg Oral Daily   pravastatin   20 mg Oral q1800   sodium chloride  flush  3 mL Intravenous Q12H   traZODone   50 mg Oral QHS   Continuous Infusions:   LOS: 2 days   Time spent: 55 minutes  Camellia Door, DO  [facility]  05/06/2024, 11:01 AM

## 2024-05-06 NOTE — Progress Notes (Signed)
 SATURATION QUALIFICATIONS: (This note is used to comply with regulatory documentation for home oxygen)  Patient Saturations on Room Air at Rest = 87%  Patient Saturations on Room Air while Ambulating = NT as <88% at rest  Patient Saturations on 4 Liters of oxygen while Ambulating = 89%  Please briefly explain why patient needs home oxygen:  To keep sats >87% during functional mobility.   Macario RAMAN, PT Acute Rehabilitation Services  Office [phone]

## 2024-05-06 NOTE — Progress Notes (Addendum)
 Speech Language Pathology Treatment: Dysphagia  Patient Details Name: Richard Vaughn MRN: [id number] DOB: [birth year] Today's Date: 05/06/2024 Time: 8764-8754 SLP Time Calculation (min) (ACUTE ONLY): 10 min  Assessment / Plan / Recommendation Clinical Impression  Patient seen for dysphagia management.  Awake in bed had just finished his breakfast and had fruit from him.  Reports he was doing some coughing with his meal at lunch but not much.  SLP relayed to patient SLP involved to mitigate aspiration and tolerance of aspiration but not prevent it.  Patient agreeable to undergo MBS after SLP discussion.  Called wife on cell number and recommend on the board.  Discussed patient's lack of pulmonary infections and adequacy of nutrition since his MBS January 2024 and question of what they would like to have.  Wife reports she would like to see with patient currently is with his swallowing - as she is pretty sure this is an aspiration pneumonia.   Advised this is to see what strategies may be helpful for aspiration mitigation - and pt did now like thickened liquids in the past and thus will contribute to dehydration risk if he takes them.   Asked patient if he has been using a EMST (as prescribed by outpatient SLP last Spring) to improve laryngeal closure and cough strength to which he stated no.  Patient does not recall when he used this last.  SLP encouraged him and his wife to make sure that he continues to do this and never stops as he has chronic dysphagia and aspiration and needs the use devices to help with compensations.  Wife and patient report understanding to information.   Patient's wife, Richard Vaughn, denies patient having frequent eructation during meals as SLP questions if there could be a PES/esophageal component to his dysphagia.    Patient able to demonstrate use of flutter valve with excellent precision , SLP-encouraged him to use 10xs per house as able but not immediately after eating.    MBS planned next date-message MD and RN.  Continue water with meals please. MD made aware and agreeable to plan.     HPI HPI: 73 yo male adm to [facility] with shortness of breath and cough with brown sputum over last several days.  PMH + for CVA, spastic left-sided hemiplegia, GERD, Barrett's esophagus, chronic pain presenting with cough. Chest CT  Right lower lobe consolidation and irregular consolidation within the right middle lobe, likely pneumonia. 3. Layering secretions within the trachea extending into bilateral airways with multifocal subsegmental mucous plugging.  Pt has undergone prior MBS study that showed silent aspiration of thin due to decreased adequacy of airway closure and timing, as well as penetration of nectar liquids.  He reports he continues with reflux medication use and denies coughing significantly during intake.  Had previously seen OP SLP for dysphagia/dysphonia and conducted RMST - Reports he no longer uses this EMST device.  Denies requiring heimlich manuever nor having recurrent pneumonias.  Reports poor appetite.      SLP Plan  MBS          Recommendations  Diet recommendations: Dysphagia 3 (mechanical soft);Thin liquid Liquids provided via: Cup Medication Administration: Whole meds with liquid (with water) Compensations: Slow rate;Small sips/bites Postural Changes and/or Swallow Maneuvers: Seated upright 90 degrees;Upright 30-60 min after meal                      Frequent or constant Supervision/Assistance Dysphagia, unspecified (R13.10)     MBS  Richard Vaughn  05/06/2024, 1:37 PM

## 2024-05-06 NOTE — Plan of Care (Signed)
  Problem: Activity: Goal: Ability to tolerate increased activity will improve Outcome: Progressing   Problem: Education: Goal: Knowledge of General Education information will improve Description: Including pain rating scale, medication(s)/side effects and non-pharmacologic comfort measures Outcome: Progressing   Problem: Clinical Measurements: Goal: Ability to maintain clinical measurements within normal limits will improve Outcome: Progressing Goal: Respiratory complications will improve Outcome: Progressing   Problem: Nutrition: Goal: Adequate nutrition will be maintained Outcome: Progressing

## 2024-05-06 NOTE — Progress Notes (Signed)
 CPT not performed due to pt being transferred to another floor at this time.

## 2024-05-06 NOTE — Evaluation (Signed)
 Physical Therapy Evaluation Patient Details Name: Richard Vaughn MRN: [id number] DOB: Dec 28, 1950 Today's Date: 05/06/2024  History of Present Illness  73 y.o. male presenting with cough and fever 05/04/24. +pna, PMH significant of hypertension, hyperlipidemia, CVA, spastic left-sided hemiplegia, PAD, carotid artery disease, GERD, Barrett's esophagus, depression, central sleep apnea, obstructive sleep apnea, chronic pain  Clinical Impression   Pt admitted secondary to problem above with deficits below. PTA patient lives with wife in home with 3 steps to enter and lives on the main floor. He walks indoors either with cane or holding onto furniture modified independent.  Pt currently requires min assist to come to stand and min assist to ambulate with RW and his shoes donned. He has tendency to leave LLE trailing behind and toe catches causing pt to pitch forward and catch himself with RW and assist. Required 4L O2 with sats 89% and HR up to 125 bpm.  Anticipate patient will benefit from PT to address problems listed below. Will continue to follow acutely to maximize functional mobility, independence, and safety.  Patient may benefit from HHPT on discharge (unless he is back to his baseline).          If plan is discharge home, recommend the following: A little help with walking and/or transfers;A little help with bathing/dressing/bathroom;Assist for transportation;Help with stairs or ramp for entrance   Can travel by private vehicle        Equipment Recommendations None recommended by PT  Recommendations for Other Services  OT consult    Functional Status Assessment Patient has had a recent decline in their functional status and demonstrates the ability to make significant improvements in function in a reasonable and predictable amount of time.     Precautions / Restrictions Precautions Precautions: Fall Recall of Precautions/Restrictions: Intact      Mobility  Bed Mobility                General bed mobility comments: up in chair    Transfers Overall transfer level: Needs assistance Equipment used: Rolling walker (2 wheels) Transfers: Sit to/from Stand Sit to Stand: Min assist           General transfer comment: repeated trials until successful (lacks forward wt-shift; feet sliding in hospital socks and donned shoes for better grip)    Ambulation/Gait Ambulation/Gait assistance: Min assist Gait Distance (Feet): 25 Feet Assistive device: Rolling walker (2 wheels) Gait Pattern/deviations: Step-to pattern, Decreased step length - left, Decreased dorsiflexion - left, Wide base of support   Gait velocity interpretation: <1.8 ft/sec, indicate of risk for recurrent falls   General Gait Details: tends to lead with RLE with LLE trailing behind and often catches left foot causing him to pitch forward with LOB; difficulty following cues to advance LLE first  Stairs            Wheelchair Mobility     Tilt Bed    Modified Rankin (Stroke Patients Only)       Balance Overall balance assessment: Needs assistance Sitting-balance support: No upper extremity supported, Feet supported Sitting balance-Leahy Scale: Good Sitting balance - Comments: reaching to floor to don his socks   Standing balance support: No upper extremity supported Standing balance-Leahy Scale: Fair                               Pertinent Vitals/Pain Pain Assessment Pain Assessment: No/denies pain    Home Living Family/patient expects to be  discharged to:: Private residence Living Arrangements: Spouse/significant other Available Help at Discharge: Family;Available 24 hours/day Type of Home: House Home Access: Stairs to enter Entrance Stairs-Rails: Right;Left;Can reach both Entrance Stairs-Number of Steps: 3   Home Layout: Two level;Laundry or work area in basement;Able to live on main level with bedroom/bathroom Home Equipment: Agricultural consultant (2 wheels);Cane  - single point;BSC/3in1;Shower seat      Prior Function Prior Level of Function : Needs assist             Mobility Comments: ambulates indoors modified ind with cane vs none (furniture surfs); uses walker in community; assist on stairs       Extremity/Trunk Assessment   Upper Extremity Assessment Upper Extremity Assessment: Defer to OT evaluation (noted spastic LUE; able to grip RW)    Lower Extremity Assessment Lower Extremity Assessment: LLE deficits/detail LLE Deficits / Details: spastic hemiplegia with tendency to catch toe during gait LLE Coordination: decreased gross motor    Cervical / Trunk Assessment Cervical / Trunk Assessment: Kyphotic  Communication   Communication Communication: Impaired Factors Affecting Communication: Reduced clarity of speech    Cognition Arousal: Alert Behavior During Therapy: WFL for tasks assessed/performed   PT - Cognitive impairments: No apparent impairments                         Following commands: Intact       Cueing Cueing Techniques: Verbal cues     General Comments General comments (skin integrity, edema, etc.): on arrival pt on breathing treatment with RT present and sats 89%; she reports he was on RA on her arrival with sats 87%; she placed pt on 3L with sats 89% and recommended 4L for ambulation; pt dropped to 89% on 4L HR 102 to 125    Exercises     Assessment/Plan    PT Assessment Patient needs continued PT services  PT Problem List Decreased range of motion;Decreased activity tolerance;Decreased balance;Decreased mobility;Decreased coordination;Decreased knowledge of use of DME;Decreased knowledge of precautions;Cardiopulmonary status limiting activity;Impaired tone       PT Treatment Interventions DME instruction;Gait training;Stair training;Functional mobility training;Therapeutic activities;Therapeutic exercise;Balance training;Neuromuscular re-education;Patient/family education    PT Goals  (Current goals can be found in the Care Plan section)  Acute Rehab PT Goals Patient Stated Goal: go home soon PT Goal Formulation: With patient Time For Goal Achievement: 05/20/24 Potential to Achieve Goals: Good    Frequency Min 2X/week     Co-evaluation               AM-PAC PT 6 Clicks Mobility  Outcome Measure Help needed turning from your back to your side while in a flat bed without using bedrails?: A Lot Help needed moving from lying on your back to sitting on the side of a flat bed without using bedrails?: A Lot Help needed moving to and from a bed to a chair (including a wheelchair)?: A Little Help needed standing up from a chair using your arms (e.g., wheelchair or bedside chair)?: A Little Help needed to walk in hospital room?: A Little Help needed climbing 3-5 steps with a railing? : Total 6 Click Score: 14    End of Session Equipment Utilized During Treatment: Gait belt;Oxygen Activity Tolerance: Treatment limited secondary to medical complications (Comment) (drop in sats; incr HR) Patient left: in chair;with call bell/phone within reach Nurse Communication: Mobility status;Other (comment) (RT put back on O2; dropped sats on4L with amb) PT Visit Diagnosis: Unsteadiness on feet (  R26.81);Other abnormalities of gait and mobility (R26.89)    Time: 9151-9084 PT Time Calculation (min) (ACUTE ONLY): 27 min   Charges:   PT Evaluation $PT Eval Low Complexity: 1 Low PT Treatments $Gait Training: 8-22 mins PT General Charges $$ ACUTE PT VISIT: 1 Visit          Macario RAMAN, PT Acute Rehabilitation Services  Office [phone]   Macario SHAUNNA Soja 05/06/2024, 9:33 AM

## 2024-05-06 NOTE — Progress Notes (Signed)
 Pt educated on flutter valve by RT at bedside. Pt demonstrated with teach back, pt showed understanding of proper frequency and usage of flutter valve for CPT.     05/06/24 0846  Chest Physiotherapy Tx  $ Expiratory Vibration Device Administration  Initial  $ Flutter Green Yes  CPT Duration 10  CPT Chest Site Full range  Post-Treatment Respirations 18  Cough Productive  Sputum Amount Moderate  Sputum Color Tan  Sputum Consistency Thick  Sputum Specimen Source Spontaneous cough  Position Supine  CPT Treatment Tolerance Tolerated well

## 2024-05-06 NOTE — Progress Notes (Signed)
 RT administered vest CPT therapy per MD. Pt tolerated well, vitals stable through out.     05/06/24 1123  Chest Physiotherapy Tx  $ Chest Physiotherapy (Vest, Bed, Metaneb) 1  $ Chest Vest Large  Yes  CPT Duration 10  CPT Chest Site Full range  Post-Treatment Respirations 20  Cough Non-productive  Sputum Specimen Source Spontaneous cough  Position Supine  CPT Treatment Tolerance Tolerated well

## 2024-05-07 ENCOUNTER — Inpatient Hospital Stay (HOSPITAL_COMMUNITY)

## 2024-05-07 DIAGNOSIS — J9601 Acute respiratory failure with hypoxia: Secondary | ICD-10-CM | POA: Diagnosis not present

## 2024-05-07 DIAGNOSIS — J690 Pneumonitis due to inhalation of food and vomit: Secondary | ICD-10-CM

## 2024-05-07 LAB — COMPREHENSIVE METABOLIC PANEL WITH GFR
ALT: 43 U/L (ref 0–44)
AST: 35 U/L (ref 15–41)
Albumin: 3.1 g/dL — ABNORMAL LOW (ref 3.5–5.0)
Alkaline Phosphatase: 75 U/L (ref 38–126)
Anion gap: 7 (ref 5–15)
BUN: 9 mg/dL (ref 8–23)
CO2: 22 mmol/L (ref 22–32)
Calcium: 8.6 mg/dL — ABNORMAL LOW (ref 8.9–10.3)
Chloride: 108 mmol/L (ref 98–111)
Creatinine, Ser: 1.15 mg/dL (ref 0.61–1.24)
GFR, Estimated: >60 mL/min (ref >60)
Glucose, Bld: 102 mg/dL — ABNORMAL HIGH (ref 70–99)
Potassium: 3.9 mmol/L (ref 3.5–5.1)
Sodium: 137 mmol/L (ref 135–145)
Total Bilirubin: 0.7 mg/dL (ref 0.0–1.2)
Total Protein: 6.9 g/dL (ref 6.5–8.1)

## 2024-05-07 LAB — CBC WITH DIFFERENTIAL/PLATELET
Abs Immature Granulocytes: 0.04 10*3/uL (ref 0.00–0.07)
Basophils Absolute: 0 10*3/uL (ref 0.0–0.1)
Basophils Relative: 0 %
Eosinophils Absolute: 0.2 10*3/uL (ref 0.0–0.5)
Eosinophils Relative: 2 %
HCT: 41.6 % (ref 39.0–52.0)
Hemoglobin: 14.1 g/dL (ref 13.0–17.0)
Immature Granulocytes: 1 %
Lymphocytes Relative: 23 %
Lymphs Abs: 1.8 10*3/uL (ref 0.7–4.0)
MCH: 30.9 pg (ref 26.0–34.0)
MCHC: 33.9 g/dL (ref 30.0–36.0)
MCV: 91 fL (ref 80.0–100.0)
Monocytes Absolute: 1 10*3/uL (ref 0.1–1.0)
Monocytes Relative: 13 %
Neutro Abs: 4.7 10*3/uL (ref 1.7–7.7)
Neutrophils Relative %: 61 %
Platelets: 233 10*3/uL (ref 150–400)
RBC: 4.57 MIL/uL (ref 4.22–5.81)
RDW: 12.7 % (ref 11.5–15.5)
WBC: 7.7 10*3/uL (ref 4.0–10.5)
nRBC: 0 % (ref 0.0–0.2)

## 2024-05-07 LAB — MAGNESIUM: Magnesium: 2.2 mg/dL (ref 1.7–2.4)

## 2024-05-07 NOTE — Plan of Care (Signed)
  Problem: Activity: Goal: Ability to tolerate increased activity will improve 05/07/2024 0605 by Tuere Nwosu, RN Outcome: Progressing 05/07/2024 0605 by Brittnay Pigman, RN Outcome: Progressing   Problem: Clinical Measurements: Goal: Ability to maintain a body temperature in the normal range will improve 05/07/2024 0605 by Mailynn Everly, RN Outcome: Progressing 05/07/2024 0605 by Latavius Capizzi, RN Outcome: Progressing   Problem: Respiratory: Goal: Ability to maintain adequate ventilation will improve 05/07/2024 0605 by Ziaire Hagos, RN Outcome: Progressing 05/07/2024 0605 by Gagan Dillion, RN Outcome: Progressing Goal: Ability to maintain a clear airway will improve 05/07/2024 0605 by Jazzmon Prindle, RN Outcome: Progressing 05/07/2024 0605 by Andray Assefa, RN Outcome: Progressing   Problem: Education: Goal: Knowledge of General Education information will improve Description: Including pain rating scale, medication(s)/side effects and non-pharmacologic comfort measures 05/07/2024 0605 by Zacaria Pousson, RN Outcome: Progressing 05/07/2024 0605 by Julyan Gales, RN Outcome: Progressing   Problem: Health Behavior/Discharge Planning: Goal: Ability to manage health-related needs will improve 05/07/2024 0605 by Steffany Schoenfelder, RN Outcome: Progressing 05/07/2024 0605 by Kaelah Hayashi, RN Outcome: Progressing   Problem: Clinical Measurements: Goal: Ability to maintain clinical measurements within normal limits will improve 05/07/2024 0605 by Tashena Ibach, RN Outcome: Progressing 05/07/2024 0605 by Rosina Cressler, RN Outcome: Progressing Goal: Will remain free from infection 05/07/2024 0605 by Shiann Kam, RN Outcome: Progressing 05/07/2024 0605 by Millissa Deese, RN Outcome: Progressing Goal: Diagnostic test results will improve 05/07/2024 0605 by Brookelynne Dimperio, RN Outcome: Progressing 05/07/2024 0605 by  Bradshaw Minihan, Shanique Aslinger, RN Outcome: Progressing Goal: Respiratory complications will improve 05/07/2024 0605 by Seraphina Mitchner, RN Outcome: Progressing 05/07/2024 0605 by Lemar Bakos, RN Outcome: Progressing Goal: Cardiovascular complication will be avoided 05/07/2024 0605 by Haward Pope, RN Outcome: Progressing 05/07/2024 0605 by Hartley Wyke, RN Outcome: Progressing   Problem: Activity: Goal: Risk for activity intolerance will decrease 05/07/2024 0605 by Dashton Czerwinski, RN Outcome: Progressing 05/07/2024 0605 by Jaliya Siegmann, RN Outcome: Progressing   Problem: Nutrition: Goal: Adequate nutrition will be maintained 05/07/2024 0605 by Jadon Harbaugh, RN Outcome: Progressing 05/07/2024 0605 by Cahterine Heinzel, RN Outcome: Progressing   Problem: Coping: Goal: Level of anxiety will decrease 05/07/2024 0605 by Cage Gupton, RN Outcome: Progressing 05/07/2024 0605 by Kennon Encinas, RN Outcome: Progressing   Problem: Elimination: Goal: Will not experience complications related to bowel motility 05/07/2024 0605 by Jarae Nemmers, RN Outcome: Progressing 05/07/2024 0605 by Jaymari Cromie, RN Outcome: Progressing Goal: Will not experience complications related to urinary retention 05/07/2024 0605 by Alice Burnside, RN Outcome: Progressing 05/07/2024 0605 by Mirian Casco, RN Outcome: Progressing   Problem: Pain Managment: Goal: General experience of comfort will improve and/or be controlled 05/07/2024 0605 by Ester Mabe, RN Outcome: Progressing 05/07/2024 0605 by Leshay Desaulniers, RN Outcome: Progressing   Problem: Safety: Goal: Ability to remain free from injury will improve 05/07/2024 0605 by Jakyren Fluegge, RN Outcome: Progressing 05/07/2024 0605 by Ramie Erman, RN Outcome: Progressing   Problem: Skin Integrity: Goal: Risk for impaired skin integrity will decrease 05/07/2024 0605 by ,  , RN Outcome: Progressing 05/07/2024 0605 by Keirsten Matuska, RN Outcome: Progressing

## 2024-05-07 NOTE — Evaluation (Signed)
 Occupational Therapy Evaluation Patient Details Name: Richard Vaughn MRN: [id number] DOB: 1951-02-22 Today's Date: 05/07/2024   History of Present Illness   73 y.o. male adm 05/04/24 with PNA. PMhx: HTN, HLD, CVA, spastic left-sided hemiplegia, PAD, carotid artery disease, GERD, Barrett's esophagus, depression, central sleep apnea, OSA, chronic pain     Clinical Impressions Pt admitted for above, PTA pt reports being ind with ADLs and furniture walking at home or holding on to his spouse. Pt currently presenting with impaired cardiopulmonary status, currently needing supplemental 02 for mobility vs not using any at baseline. Pt was able to be bumped down from 4L to 2L for hall level mobility with CGA HHA. He also demonstrates ability to complete ADLs with CGA to setup. He has residual L spastic hemiplegia, discussed potential benefits with outpt OT as pt as not done much in the past. OT to continue following pt acutely to progress pt as able. Recommend Outpatient OT for listed hemi deficitts.      If plan is discharge home, recommend the following:   A little help with bathing/dressing/bathroom;Assistance with cooking/housework     Functional Status Assessment   Patient has had a recent decline in their functional status and demonstrates the ability to make significant improvements in function in a reasonable and predictable amount of time.     Equipment Recommendations   None recommended by OT     Recommendations for Other Services         Precautions/Restrictions   Precautions Precautions: Fall Recall of Precautions/Restrictions: Intact Restrictions Weight Bearing Restrictions Per Provider Order: No     Mobility Bed Mobility Overal bed mobility: Needs Assistance Bed Mobility: Supine to Sit, Sit to Supine     Supine to sit: Min assist, Used rails Sit to supine: Contact guard assist   General bed mobility comments: cues to sequence.    Transfers Overall  transfer level: Needs assistance Equipment used: Rolling walker (2 wheels) Transfers: Sit to/from Stand Sit to Stand: Min assist           General transfer comment: min A to help steady, and cues to faciliate best position for STS,      Balance Overall balance assessment: Needs assistance Sitting-balance support: No upper extremity supported, Feet supported Sitting balance-Leahy Scale: Good     Standing balance support: No upper extremity supported Standing balance-Leahy Scale: Fair                             ADL either performed or assessed with clinical judgement   ADL Overall ADL's : Needs assistance/impaired Eating/Feeding: Independent;Sitting   Grooming: Standing;Wash/dry face;Contact guard assist   Upper Body Bathing: Sitting;Set up   Lower Body Bathing: Sitting/lateral leans;Contact guard assist   Upper Body Dressing : Sitting;Minimal assistance Upper Body Dressing Details (indicate cue type and reason): donning gown. Lower Body Dressing: Sitting/lateral leans;Contact guard assist Lower Body Dressing Details (indicate cue type and reason): CGA to don socks, increased effort sitting EOB but no physical assist from OT needed. Toilet Transfer: Software engineer Details (indicate cue type and reason): HHA, Toileting- Clothing Manipulation and Hygiene: Contact guard assist;Sit to/from stand       Functional mobility during ADLs: Contact guard assist       Vision   Vision Assessment?: No apparent visual deficits     Perception Perception: Within Functional Limits       Praxis Praxis: [facility]  Pertinent Vitals/Pain Pain Assessment Pain Assessment: No/denies pain     Extremity/Trunk Assessment Upper Extremity Assessment Upper Extremity Assessment: Overall WFL for tasks assessed;LUE deficits/detail LUE Deficits / Details: L spastic hemiplegia from past CVA, grasps is strong, incoordination of digits. Hx of rotator  injury. AROM 100 deg noted to on eval but not full tested. LUE Sensation: WNL LUE Coordination: decreased fine motor   Lower Extremity Assessment LLE Deficits / Details: spastic hemiplegia with tendency to catch toe during gait LLE Coordination: decreased gross motor   Cervical / Trunk Assessment Cervical / Trunk Assessment: Kyphotic   Communication Communication Communication: Impaired Factors Affecting Communication: Reduced clarity of speech   Cognition Arousal: Alert Behavior During Therapy: WFL for tasks assessed/performed Cognition: No apparent impairments, Difficult to assess Difficult to assess due to: Impaired communication           OT - Cognition Comments: Follows all commands without noted challenge.                 Following commands: Intact       Cueing  General Comments   Cueing Techniques: Verbal cues  HR up to 136 bpm with activity, Sp02 94% on 4L but with reductioned pt maintained >94% Sp02 while on 2L during rest and activity. He denies all c/o SOB   Exercises     Shoulder Instructions      Home Living Family/patient expects to be discharged to:: Private residence Living Arrangements: Spouse/significant other Available Help at Discharge: Family;Available 24 hours/day Type of Home: House Home Access: Stairs to enter [employer] of Steps: 3 Entrance Stairs-Rails: Right;Left;Can reach both Home Layout: Two level;Laundry or work area in basement;Able to live on main level with bedroom/bathroom     Bathroom Shower/Tub: Producer, television/film/video: Administrator Accessibility: Yes   Home Equipment: Agricultural consultant (2 wheels);Cane - single point;BSC/3in1;Shower seat - built in          Prior Functioning/Environment Prior Level of Function : Needs assist             Mobility Comments: ambulates indoors modified ind with cane vs none (furniture surfs); uses walker in community; assist on stairs ADLs Comments:  reports ind with ADLs, goes to the gym with spouse.    OT Problem List: Decreased coordination;Impaired balance (sitting and/or standing);Cardiopulmonary status limiting activity   OT Treatment/Interventions: Self-care/ADL training;Therapeutic exercise;Patient/family education;Balance training;Therapeutic activities      OT Goals(Current goals can be found in the care plan section)   Acute Rehab OT Goals Patient Stated Goal: To return home OT Goal Formulation: With patient/family Time For Goal Achievement: 05/21/24 Potential to Achieve Goals: Good ADL Goals Pt Will Perform Grooming: with set-up;standing Pt Will Perform Lower Body Bathing: sit to/from stand;with supervision Pt Will Perform Lower Body Dressing: with supervision;sit to/from stand Pt Will Transfer to Toilet: with supervision;ambulating (+LRAD)   OT Frequency:  Min 2X/week    Co-evaluation              AM-PAC OT 6 Clicks Daily Activity     Outcome Measure Help from another person eating meals?: None Help from another person taking care of personal grooming?: A Little Help from another person toileting, which includes using toliet, bedpan, or urinal?: A Little Help from another person bathing (including washing, rinsing, drying)?: A Little Help from another person to put on and taking off regular upper body clothing?: A Little Help from another person to put on and taking off regular lower body  clothing?: A Little 6 Click Score: 19   End of Session Equipment Utilized During Treatment: Gait belt Nurse Communication: Mobility status  Activity Tolerance: Patient tolerated treatment well Patient left: in bed;with call bell/phone within reach;with family/visitor present  OT Visit Diagnosis: Other (comment) (SOB)                Time: 9170-9095 OT Time Calculation (min): 35 min Charges:  OT General Charges $OT Visit: 1 Visit OT Evaluation $OT Eval Low Complexity: 1 Low OT Treatments $Therapeutic  Activity: 8-22 mins  05/07/2024  AB, OTR/L  Acute Rehabilitation Services  Office: [phone]   Curtistine JONETTA Das 05/07/2024, 10:40 AM

## 2024-05-07 NOTE — Progress Notes (Signed)
 SATURATION QUALIFICATIONS: (This note is used to comply with regulatory documentation for home oxygen)  Patient Saturations on Room Air at Rest = 93%  Patient Saturations on Room Air while Ambulating = 90%  Lenoard SQUIBB, PT Acute Rehabilitation Services Office: [phone]

## 2024-05-07 NOTE — Progress Notes (Signed)
 Transition of Care [facility]) - Inpatient Brief Assessment   Patient Details  Name: Richard Vaughn MRN: [id number] Date of Birth: Dec 09, 1950  [facility] CM/SW Contact:    Rosaline JONELLE Joe, RN Phone Number: 05/07/2024, 1:32 PM   Clinical Narrative: CM met with the patient and wife at the bedside to discuss TOC needs for home.  The patient lives with wife at the home and has [place] and RW at the home.  Patient remains on RA at this time.  Patient and wife were offered Medicare choice regarding home health and wife prefers [facility].  HH orders for PT/OT ordered to be co-signed by the MD.  I called Burnard, CM with [facility] and Burnard, CM accepted for services.  No other TOC needs and patient will return home with home health when stable for discharge.   Transition of Care Asessment: Insurance and Status: (P) Insurance coverage has been reviewed Patient has primary care physician: (P) Yes Home environment has been reviewed: (P) from home with spouse Prior level of function:: (P) RW, Cane (uses [place] most of the time at home) Prior/Current Home Services: (P) No current home services Social Drivers of Health Review: (P) SDOH reviewed interventions complete Readmission risk has been reviewed: (P) Yes Transition of care needs: (P) transition of care needs identified, TOC will continue to follow

## 2024-05-07 NOTE — Progress Notes (Signed)
 [facility] PROGRESS NOTE   Richard Vaughn FMW:[id number] DOB: [birth year] DOA: 05/04/2024  PCP: [place], [name], MD  Brief History: 73 y.o. male with medical history significant of hypertension, hyperlipidemia, CVA, spastic left-sided hemiplegia, PAD, carotid artery disease, GERD, Barrett's esophagus, depression, central sleep apnea, obstructive sleep apnea, chronic pain presenting with cough. Patient presented complaining of 3 to 4 days of cough and fatigue.  He is cough has been productive of brown sputum.  He has noted dyspnea on exertion.  Yesterday noted to have a fever with some associated chills and bodyaches. Patient was found to have pneumonia involving his right lung.  He was hospitalized for further management.  Consultants: None  Procedures: None    Subjective/Interval History: Patient sitting on the side of his bed.  He denies any chest pain.  Some shortness of breath is present.  Occasional cough.  His wife is at the bedside.    Assessment/Plan:  Aspiration pneumonia with mucous plugging Thought to be secondary to silent aspiration.  Patient seen by speech therapy.  Plan is for modified barium swallow today. Patient was noted to be febrile.  However his procalcitonin level was negative. He is currently on Augmentin .  Respiratory status is stable.  Acute respiratory failure with hypoxia Most likely due to pneumonia.  Not on home oxygen but requiring oxygen here.  Continue to monitor.  Wean down to maintain saturations greater than 90%.  History of intracranial hemorrhage Stable.  History of stroke with left-sided hemiparesis  Stable.  Essential hypertension Holding ARB.  Continue amlodipine .  Monitor blood pressures closely.  History of depression Continue Cymbalta .  Chronic pain syndrome Continue Cymbalta   Obesity Estimated body mass index is 32.49 kg/m as calculated from the following:   Height as of this encounter: 6' 3 (1.905 m).   Weight  as of this encounter: 117.9 kg.  DVT Prophylaxis: Lovenox  Code Status: Full code Family Communication: Discussed with patient and his wife Disposition Plan: Plan is to return home when improved      Medications: Scheduled:  amLODipine   2.5 mg Oral QHS   amoxicillin -clavulanate  1 tablet Oral Q12H   aspirin  EC  81 mg Oral Daily   DULoxetine   60 mg Oral Daily   enoxaparin  (LOVENOX ) injection  55 mg Subcutaneous Q24H   guaiFENesin   600 mg Oral BID   pantoprazole   40 mg Oral Daily   pravastatin   20 mg Oral q1800   sodium chloride  flush  3 mL Intravenous Q12H   traZODone   50 mg Oral QHS   Continuous: PRN:acetaminophen  **OR** acetaminophen , chlorpheniramine-HYDROcodone, ipratropium-albuterol , melatonin, polyethylene glycol  Antibiotics: Anti-infectives (From admission, onward)    Start     Dose/Rate Route Frequency Ordered Stop   05/05/24 1530  amoxicillin -clavulanate (AUGMENTIN ) 875-125 MG per tablet 1 tablet        1 tablet Oral Every 12 hours 05/05/24 1444     05/05/24 1400  cefTRIAXone  (ROCEPHIN ) 2 g in sodium chloride  0.9 % 100 mL IVPB  Status:  Discontinued        2 g 200 mL/hr over 30 Minutes Intravenous Every 24 hours 05/04/24 1421 05/05/24 1444   05/05/24 1400  azithromycin  (ZITHROMAX ) 500 mg in sodium chloride  0.9 % 250 mL IVPB  Status:  Discontinued        500 mg 250 mL/hr over 60 Minutes Intravenous Every 24 hours 05/04/24 1421 05/05/24 1444   05/04/24 1345  cefTRIAXone  (ROCEPHIN ) 2 g in sodium chloride  0.9 % 100 mL IVPB  2 g 200 mL/hr over 30 Minutes Intravenous Once 05/04/24 1340 05/04/24 1520   05/04/24 1345  azithromycin  (ZITHROMAX ) 500 mg in sodium chloride  0.9 % 250 mL IVPB        500 mg 250 mL/hr over 60 Minutes Intravenous  Once 05/04/24 1340 05/04/24 1536       Objective:  Vital Signs  Vitals:   05/06/24 2200 05/07/24 0348 05/07/24 0814 05/07/24 1006  BP: (!) 125/101 (!) 121/92 121/87 (!) 112/94  Pulse: 87 70 70 82  Resp:  20 18 18   Temp:  97.8 F (36.6 C) 98.1 F (36.7 C) 98.3 F (36.8 C) 98.1 F (36.7 C)  TempSrc: Oral     SpO2: 98% 97% 95% 95%  Weight:      Height:        Intake/Output Summary (Last 24 hours) at 05/07/2024 1057 Last data filed at 05/07/2024 0413 Gross per 24 hour  Intake 480 ml  Output 1000 ml  Net -520 ml   Filed Weights   05/04/24 0750 05/05/24 0342  Weight: 115 kg 117.9 kg    General appearance: Awake alert.  In no distress Resp: Mildly tachypneic at rest without use of accessory muscles.  Crackles in the right.  No wheezing or rhonchi. Cardio: S1-S2 is normal regular.  No S3-S4.  No rubs murmurs or bruit GI: Abdomen is soft.  Nontender nondistended.  Bowel sounds are present normal.  No masses organomegaly Extremities: No edema.  Full range of motion of lower extremities. Neurologic: Alert and oriented x3.  No focal neurological deficits.    Lab Results:  Data Reviewed: I have personally reviewed following labs and reports of the imaging studies  CBC: Recent Labs  Lab 05/04/24 0830 05/04/24 0928 05/05/24 0226 05/06/24 1254 05/07/24 0424  WBC 11.8*  --  11.0* 8.5 7.7  NEUTROABS 8.8*  --   --  5.9 4.7  HGB 13.2 13.3 12.6* 14.2 14.1  HCT 40.4 39.0 37.6* 41.7 41.6  MCV 92.4  --  91.3 90.7 91.0  PLT 183  --  172 218 233    Basic Metabolic Panel: Recent Labs  Lab 05/04/24 0830 05/04/24 0928 05/05/24 0226 05/07/24 0424  NA 135 138 137 137  K 3.7 3.8 3.8 3.9  CL 104  --  107 108  CO2 23  --  22 22  GLUCOSE 111*  --  106* 102*  BUN 11  --  8 9  CREATININE 0.98  --  0.94 1.15  CALCIUM  8.6*  --  8.5* 8.6*  MG  --   --   --  2.2    GFR: Estimated Creatinine Clearance: 82.8 mL/min (by C-G formula based on SCr of 1.15 mg/dL).  Liver Function Tests: Recent Labs  Lab 05/04/24 0830 05/05/24 0226 05/07/24 0424  AST 14* 15 35  ALT 15 15 43  ALKPHOS 60 62 75  BILITOT 0.9 1.1 0.7  PROT 6.6 6.3* 6.9  ALBUMIN 3.4* 3.0* 3.1*    Coagulation Profile: Recent Labs  Lab  05/04/24 0830  INR 1.2     Recent Results (from the past 240 hours)  Blood Culture (routine x 2)     Status: None (Preliminary result)   Collection Time: 05/04/24  8:07 AM   Specimen: BLOOD RIGHT FOREARM  Result Value Ref Range Status   Specimen Description BLOOD RIGHT FOREARM  Final   Special Requests   Final    BOTTLES DRAWN AEROBIC AND ANAEROBIC Blood Culture adequate volume   Culture  Final    NO GROWTH 3 DAYS Performed at [facility], [address], [place], [place] [address]    Report Status PENDING  Incomplete  Resp panel by RT-PCR (RSV, Flu A&B, Covid) Anterior Nasal Swab     Status: None   Collection Time: 05/04/24 12:27 PM   Specimen: Anterior Nasal Swab  Result Value Ref Range Status   SARS Coronavirus 2 by RT PCR NEGATIVE NEGATIVE Final   Influenza A by PCR NEGATIVE NEGATIVE Final   Influenza B by PCR NEGATIVE NEGATIVE Final    Comment: (NOTE) The Xpert Xpress SARS-CoV-2/FLU/RSV plus assay is intended as an aid in the diagnosis of influenza from Nasopharyngeal swab specimens and should not be used as a sole basis for treatment. Nasal washings and aspirates are unacceptable for Xpert Xpress SARS-CoV-2/FLU/RSV testing.  Fact Sheet for Patients: BloggerCourse.com  Fact Sheet for Healthcare Providers: SeriousBroker.it  This test is not yet approved or cleared by the [place]  FDA and has been authorized for detection and/or diagnosis of SARS-CoV-2 by FDA under an Emergency Use Authorization (EUA). This EUA will remain in effect (meaning this test can be used) for the duration of the COVID-19 declaration under Section 564(b)(1) of the Act, 21 U.S.C. section 360bbb-3(b)(1), unless the authorization is terminated or revoked.     Resp Syncytial Virus by PCR NEGATIVE NEGATIVE Final    Comment: (NOTE) Fact Sheet for Patients: BloggerCourse.com  Fact Sheet for Healthcare  Providers: SeriousBroker.it  This test is not yet approved or cleared by the [place]  FDA and has been authorized for detection and/or diagnosis of SARS-CoV-2 by FDA under an Emergency Use Authorization (EUA). This EUA will remain in effect (meaning this test can be used) for the duration of the COVID-19 declaration under Section 564(b)(1) of the Act, 21 U.S.C. section 360bbb-3(b)(1), unless the authorization is terminated or revoked.  Performed at [facility], [address], [place], [place] [address]   Blood Culture (routine x 2)     Status: None (Preliminary result)   Collection Time: 05/04/24  2:39 PM   Specimen: BLOOD RIGHT ARM  Result Value Ref Range Status   Specimen Description BLOOD RIGHT ARM  Final   Special Requests   Final    BOTTLES DRAWN AEROBIC AND ANAEROBIC Blood Culture adequate volume   Culture   Final    NO GROWTH 3 DAYS Performed at [facility], [address] [place], [place] [address]    Report Status PENDING  Incomplete  MRSA Next Gen by PCR, Nasal     Status: None   Collection Time: 05/05/24  5:58 AM   Specimen: Nasal Mucosa; Nasal Swab  Result Value Ref Range Status   MRSA by PCR Next Gen NOT DETECTED NOT DETECTED Final    Comment: (NOTE) The GeneXpert MRSA Assay (FDA approved for NASAL specimens only), is one component of a comprehensive MRSA colonization surveillance program. It is not intended to diagnose MRSA infection nor to guide or monitor treatment for MRSA infections. Test performance is not FDA approved in patients less than 50 years old. Performed at [facility], [address], [place], [place] [address]       Radiology Studies: No results found.     LOS: 3 days   Dontai Pember [employer] on [email or web address]  05/07/2024, 10:57 AM

## 2024-05-07 NOTE — Progress Notes (Signed)
 Physical Therapy Treatment Patient Details Name: Richard Vaughn MRN: [id number] DOB: 01-22-1951 Today's Date: 05/07/2024   History of Present Illness 73 y.o. male adm 05/04/24 with PNA. PMhx: HTN, HLD, CVA, spastic left-sided hemiplegia, PAD, carotid artery disease, GERD, Barrett's esophagus, depression, central sleep apnea, OSA, chronic pain    PT Comments  PT pleasant and stating he usually walks holding onto wife more often then using cane or environment. Pt able to maintain SPO2 90% and > throughout session on RA and left on RA end of session. Pt with home pulse ox and encouraged to continue spot checking at home, sitting up right and following SLP recommendation for eating. Pt near baseline with wife able to assist at D/C. Encouraged continued mobility and awareness of LLE placement and safety with gait. Will continue to follow.     If plan is discharge home, recommend the following: A little help with walking and/or transfers;A little help with bathing/dressing/bathroom;Assist for transportation;Help with stairs or ramp for entrance   Can travel by private vehicle        Equipment Recommendations  None recommended by PT    Recommendations for Other Services       Precautions / Restrictions Precautions Precautions: Fall Recall of Precautions/Restrictions: Intact Precaution/Restrictions Comments: left hemiparesis     Mobility  Bed Mobility Overal bed mobility: Modified Independent             General bed mobility comments: bed flat without rail, use of momentum to rise    Transfers Overall transfer level: Modified independent                 General transfer comment: pt able to stand from bed without assist and use of armrest to sit at chair    Ambulation/Gait Ambulation/Gait assistance: Min assist Gait Distance (Feet): 160 Feet Assistive device: 1 person hand held assist Gait Pattern/deviations: Step-to pattern, Decreased step length - left, Decreased  dorsiflexion - left, Wide base of support   Gait velocity interpretation: <1.8 ft/sec, indicate of risk for recurrent falls   General Gait Details: tends to lead with RLE with LLE trailing behind and often catches left foot causing him to pitch forward with LOB; difficulty following cues to advance LLE first. cues to bring LUE in front and to be aware of foot positioning, standing rest halfway   Stairs             Wheelchair Mobility     Tilt Bed    Modified Rankin (Stroke Patients Only)       Balance Overall balance assessment: Needs assistance Sitting-balance support: No upper extremity supported, Feet supported Sitting balance-Leahy Scale: Good Sitting balance - Comments: able to sit without support, don shoes sitting EOB   Standing balance support: Single extremity supported, No upper extremity supported Standing balance-Leahy Scale: Fair Standing balance comment: able to static stand without support. RUE support for gait                            Communication Communication Communication: Impaired Factors Affecting Communication: Reduced clarity of speech;Difficulty expressing self  Cognition Arousal: Alert Behavior During Therapy: WFL for tasks assessed/performed   PT - Cognitive impairments: Difficult to assess Difficult to assess due to: Impaired communication                       Following commands: Intact      Cueing Cueing Techniques: Verbal cues  Exercises      General Comments General comments (skin integrity, edema, etc.): HR up to 136 bpm with activity, Sp02 94% on 4L but with reductioned pt maintained >94% Sp02 while on 2L during rest and activity. He denies all c/o SOB      Pertinent Vitals/Pain Pain Assessment Pain Assessment: No/denies pain    Home Living Family/patient expects to be discharged to:: Private residence Living Arrangements: Spouse/significant other Available Help at Discharge: Family;Available 24  hours/day Type of Home: House Home Access: Stairs to enter Entrance Stairs-Rails: Right;Left;Can reach both Entrance Stairs-Number of Steps: 3   Home Layout: Two level;Laundry or work area in basement;Able to live on main level with bedroom/bathroom Home Equipment: Agricultural consultant (2 wheels);Cane - single point;BSC/3in1;Shower seat - built in      Prior Function            PT Goals (current goals can now be found in the care plan section) Progress towards PT goals: Progressing toward goals    Frequency    Min 2X/week      PT Plan      Co-evaluation              AM-PAC PT 6 Clicks Mobility   Outcome Measure  Help needed turning from your back to your side while in a flat bed without using bedrails?: None Help needed moving from lying on your back to sitting on the side of a flat bed without using bedrails?: None Help needed moving to and from a bed to a chair (including a wheelchair)?: A Little Help needed standing up from a chair using your arms (e.g., wheelchair or bedside chair)?: A Little Help needed to walk in hospital room?: A Little Help needed climbing 3-5 steps with a railing? : A Lot 6 Click Score: 19    End of Session Equipment Utilized During Treatment: Gait belt Activity Tolerance: Patient tolerated treatment well Patient left: in chair;with call bell/phone within reach;with family/visitor present Nurse Communication: Mobility status PT Visit Diagnosis: Unsteadiness on feet (R26.81);Other abnormalities of gait and mobility (R26.89)     Time: 8881-8857 PT Time Calculation (min) (ACUTE ONLY): 24 min  Charges:    $Gait Training: 8-22 mins $Therapeutic Activity: 8-22 mins PT General Charges $$ ACUTE PT VISIT: 1 Visit                     Lenoard SQUIBB, PT Acute Rehabilitation Services Office: [phone]    Lenoard NOVAK Kalianne Fetting 05/07/2024, 11:53 AM

## 2024-05-08 DIAGNOSIS — J9601 Acute respiratory failure with hypoxia: Secondary | ICD-10-CM | POA: Diagnosis not present

## 2024-05-08 MED ORDER — AMOXICILLIN-POT CLAVULANATE 875-125 MG PO TABS: 1.0000 | ORAL_TABLET | Freq: Two times a day (BID) | ORAL | 0 refills | Status: AC

## 2024-05-08 NOTE — Plan of Care (Signed)
  Problem: Activity: Goal: Ability to tolerate increased activity will improve Outcome: Adequate for Discharge   Problem: Clinical Measurements: Goal: Ability to maintain a body temperature in the normal range will improve Outcome: Adequate for Discharge   Problem: Respiratory: Goal: Ability to maintain adequate ventilation will improve Outcome: Adequate for Discharge Goal: Ability to maintain a clear airway will improve Outcome: Adequate for Discharge   Problem: Education: Goal: Knowledge of General Education information will improve Description: Including pain rating scale, medication(s)/side effects and non-pharmacologic comfort measures Outcome: Adequate for Discharge   Problem: Health Behavior/Discharge Planning: Goal: Ability to manage health-related needs will improve Outcome: Adequate for Discharge   Problem: Clinical Measurements: Goal: Ability to maintain clinical measurements within normal limits will improve Outcome: Adequate for Discharge Goal: Will remain free from infection Outcome: Adequate for Discharge Goal: Diagnostic test results will improve Outcome: Adequate for Discharge Goal: Respiratory complications will improve Outcome: Adequate for Discharge Goal: Cardiovascular complication will be avoided Outcome: Adequate for Discharge   Problem: Activity: Goal: Risk for activity intolerance will decrease Outcome: Adequate for Discharge   Problem: Nutrition: Goal: Adequate nutrition will be maintained Outcome: Adequate for Discharge   Problem: Coping: Goal: Level of anxiety will decrease Outcome: Adequate for Discharge   Problem: Elimination: Goal: Will not experience complications related to bowel motility Outcome: Adequate for Discharge Goal: Will not experience complications related to urinary retention Outcome: Adequate for Discharge   Problem: Pain Managment: Goal: General experience of comfort will improve and/or be controlled Outcome:  Adequate for Discharge   Problem: Safety: Goal: Ability to remain free from injury will improve Outcome: Adequate for Discharge   Problem: Skin Integrity: Goal: Risk for impaired skin integrity will decrease Outcome: Adequate for Discharge   Problem: Acute Rehab PT Goals(only PT should resolve) Goal: Pt Will Go Supine/Side To Sit Outcome: Adequate for Discharge Goal: Patient Will Transfer Sit To/From Stand Outcome: Adequate for Discharge Goal: Pt Will Ambulate Outcome: Adequate for Discharge Goal: Pt Will Go Up/Down Stairs Outcome: Adequate for Discharge   Problem: Acute Rehab OT Goals (only OT should resolve) Goal: Pt. Will Perform Grooming Outcome: Adequate for Discharge Goal: Pt. Will Perform Lower Body Bathing Outcome: Adequate for Discharge Goal: Pt. Will Perform Lower Body Dressing Outcome: Adequate for Discharge Goal: Pt. Will Transfer To Toilet Outcome: Adequate for Discharge

## 2024-05-08 NOTE — Discharge Summary (Signed)
 [facility]  Physician Discharge Summary   Patient ID: Richard Vaughn MRN: [id number] DOB/AGE: 15-Oct-1951 73 y.o.  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 male with medical history significant of hypertension, hyperlipidemia, CVA, spastic left-sided hemiplegia, PAD, carotid artery disease, GERD, Barrett's esophagus, depression, central sleep apnea, obstructive sleep apnea, chronic pain presenting with cough. Patient presented complaining of 3 to 4 days of cough and fatigue.  He is cough has been productive of brown sputum.  He has noted dyspnea on exertion.  Yesterday noted to have a fever with some associated chills and bodyaches. Patient was found to have pneumonia involving his right lung.  He was hospitalized for further management.     HOSPITAL COURSE:   Aspiration pneumonia with mucous plugging Thought to be secondary to silent aspiration.  Patient seen by speech therapy.  Underwent modified barium swallow.  No change to diet. Patient was noted to be febrile.  However his procalcitonin level was negative. He was placed on IV antibiotics and then changed  over to Augmentin .  Respiratory status has improved.   Acute respiratory failure with hypoxia Most likely due to pneumonia.  Weaned off of oxygen.   History of intracranial hemorrhage Stable.   History of stroke with left-sided hemiparesis  Stable.   Essential hypertension Holding ARB.  Continue amlodipine .  Monitor blood pressures closely.   History of depression Continue Cymbalta .   Chronic pain syndrome Continue Cymbalta    Obesity Estimated body mass index is 32.49 kg/m as calculated from the following:   Height as of this encounter: 6' 3 (1.905 m).   Weight as of this encounter: 117.9 kg.  Patient is stable.  Discussed with his wife as well.  Okay for discharge home today.   PERTINENT LABS:  The results of significant diagnostics from this hospitalization (including imaging, microbiology, ancillary and laboratory) are listed below for reference.    Microbiology: Recent Results (from the past 240 hours)  Blood Culture (routine x 2)     Status: None (Preliminary result)   Collection Time: 05/04/24  8:07 AM   Specimen: BLOOD RIGHT FOREARM  Result Value Ref Range Status   Specimen Description BLOOD RIGHT FOREARM  Final   Special Requests   Final    BOTTLES DRAWN AEROBIC AND ANAEROBIC Blood Culture adequate volume   Culture   Final    NO GROWTH 4 DAYS Performed at [facility], [address], [place], [place] [address]    Report Status PENDING  Incomplete  Resp panel by RT-PCR (RSV, Flu A&B, Covid) Anterior Nasal Swab     Status: None   Collection Time: 05/04/24 12:27 PM   Specimen: Anterior Nasal  Swab  Result Value Ref Range Status   SARS Coronavirus 2 by RT PCR NEGATIVE NEGATIVE Final   Influenza A by PCR NEGATIVE NEGATIVE Final   Influenza B by PCR NEGATIVE NEGATIVE Final    Comment: (NOTE) The Xpert Xpress SARS-CoV-2/FLU/RSV plus assay is intended as an aid in the diagnosis of influenza from Nasopharyngeal swab specimens and should not be used as a  sole basis for treatment. Nasal washings and aspirates are unacceptable for Xpert Xpress SARS-CoV-2/FLU/RSV testing.  Fact Sheet for Patients: BloggerCourse.com  Fact Sheet for Healthcare Providers: SeriousBroker.it  This test is not yet approved or cleared by the [place]  FDA and has been authorized for detection and/or diagnosis of SARS-CoV-2 by FDA under an Emergency Use Authorization (EUA). This EUA will remain in effect (meaning this test can be used) for the duration of the COVID-19 declaration under Section 564(b)(1) of the Act, 21 U.S.C. section 360bbb-3(b)(1), unless the authorization is terminated or revoked.     Resp Syncytial Virus by PCR NEGATIVE NEGATIVE Final    Comment: (NOTE) Fact Sheet for Patients: BloggerCourse.com  Fact Sheet for Healthcare Providers: SeriousBroker.it  This test is not yet approved or cleared by the [place]  FDA and has been authorized for detection and/or diagnosis of SARS-CoV-2 by FDA under an Emergency Use Authorization (EUA). This EUA will remain in effect (meaning this test can be used) for the duration of the COVID-19 declaration under Section 564(b)(1) of the Act, 21 U.S.C. section 360bbb-3(b)(1), unless the authorization is terminated or revoked.  Performed at [facility], [address], [place], [place] [address]   Blood Culture (routine x 2)     Status: None (Preliminary result)   Collection Time: 05/04/24  2:39 PM   Specimen: BLOOD RIGHT ARM  Result Value Ref Range Status   Specimen Description BLOOD RIGHT ARM  Final   Special Requests   Final    BOTTLES DRAWN AEROBIC AND ANAEROBIC Blood Culture adequate volume   Culture   Final    NO GROWTH 4 DAYS Performed at [facility], [address], [place], [place] [address]    Report Status PENDING  Incomplete  MRSA Next Gen by PCR, Nasal     Status: None    Collection Time: 05/05/24  5:58 AM   Specimen: Nasal Mucosa; Nasal Swab  Result Value Ref Range Status   MRSA by PCR Next Gen NOT DETECTED NOT DETECTED Final    Comment: (NOTE) The GeneXpert MRSA Assay (FDA approved for NASAL specimens only), is one component of a comprehensive MRSA colonization surveillance program. It is not intended to diagnose MRSA infection nor to guide or monitor treatment for MRSA infections. Test performance is not FDA approved in patients less than 24 years old. Performed at [facility], [address], [place], [place] [address]      Labs:   Basic Metabolic Panel: Recent Labs  Lab 05/04/24 0830 05/04/24 0928 05/05/24 0226 05/07/24 0424  NA 135 138 137 137  K 3.7 3.8 3.8 3.9  CL 104  --  107 108  CO2 23  --  22 22  GLUCOSE 111*  --  106* 102*  BUN 11  --  8 9  CREATININE 0.98  --  0.94 1.15  CALCIUM  8.6*  --  8.5* 8.6*  MG  --   --   --  2.2   Liver Function Tests: Recent Labs  Lab 05/04/24 0830 05/05/24 0226 05/07/24 0424  AST  14* 15 35  ALT 15 15 43  ALKPHOS 60 62 75  BILITOT 0.9 1.1 0.7  PROT 6.6 6.3* 6.9  ALBUMIN 3.4* 3.0* 3.1*    CBC: Recent Labs  Lab 05/04/24 0830 05/04/24 0928 05/05/24 0226 05/06/24 1254 05/07/24 0424  WBC 11.8*  --  11.0* 8.5 7.7  NEUTROABS 8.8*  --   --  5.9 4.7  HGB 13.2 13.3 12.6* 14.2 14.1  HCT 40.4 39.0 37.6* 41.7 41.6  MCV 92.4  --  91.3 90.7 91.0  PLT 183  --  172 218 233      IMAGING STUDIES CT Angio Chest PE W and/or Wo Contrast Result Date: 05/04/2024 CLINICAL DATA:  Three day history of cough, fever, hypoxia EXAM: CT ANGIOGRAPHY CHEST WITH CONTRAST TECHNIQUE: Multidetector CT imaging of the chest was performed using the standard protocol during bolus administration of intravenous contrast. Multiplanar CT image reconstructions and MIPs were obtained to evaluate the vascular anatomy. RADIATION DOSE REDUCTION: This exam was performed according to the departmental dose-optimization  program which includes automated exposure control, adjustment of the mA and/or kV according to patient size and/or use of iterative reconstruction technique. CONTRAST:  75mL OMNIPAQUE  IOHEXOL  350 MG/ML SOLN COMPARISON:  Same day chest radiograph, CT chest dated 08/14/2008 FINDINGS: Cardiovascular: The study is adequate for the evaluation of pulmonary embolism to the level of distal segmental pulmonary arteries due to motion artifact and timing of contrast bolus. There are no filling defects in the central, lobar, or distal segmental pulmonary artery branches to suggest acute pulmonary embolism. Ascending thoracic aorta measures 4.1 x 4.1 cm (5:73). Normal heart size. No significant pericardial fluid/thickening. Coronary artery calcifications and aortic atherosclerosis. Mediastinum/Nodes: Imaged thyroid  gland without nodules meeting criteria for imaging follow-up by size. Normal esophagus. 11 mm AP window lymph node (5:59). Lungs/Pleura: Layering secretions within the trachea extending into bilateral airways with multifocal subsegmental mucous plugging. Moderate centrilobular emphysema. Right lower lobe consolidation. Irregular consolidation is also seen within the right middle lobe. Subsegmental atelectasis of the right middle lobe and left lower lobe. No pneumothorax. No pleural effusion. Upper abdomen: Normal. Musculoskeletal: No acute or abnormal lytic or blastic osseous lesions. Multilevel degenerative changes of the thoracic spine. Review of the MIP images confirms the above findings. IMPRESSION: 1. No evidence of pulmonary embolism to the level of distal segmental pulmonary arteries. 2. Right lower lobe consolidation and irregular consolidation within the right middle lobe, likely pneumonia. 3. Layering secretions within the trachea extending into bilateral airways with multifocal subsegmental mucous plugging. 4. Enlarged AP window lymph node, likely reactive. 5. Ascending thoracic aorta measures 4.1 cm.  Recommend annual imaging followup by CTA or MRA. This recommendation follows 2010 ACCF/AHA/AATS/ACR/ASA/SCA/SCAI/SIR/STS/SVM Guidelines for the Diagnosis and Management of Patients with Thoracic Aortic Disease. Circulation. 2010; 121: Z733-z630. Aortic aneurysm NOS (ICD10-I71.9) 6. Aortic Atherosclerosis (ICD10-I70.0) and Emphysema (ICD10-J43.9). Coronary artery calcifications. Assessment for potential risk factor modification, dietary therapy or pharmacologic therapy may be warranted, if clinically indicated. Electronically Signed   By: Limin  Xu M.D.   On: 05/04/2024 13:36   DG Chest Port 1 View Result Date: 05/04/2024 CLINICAL DATA:  Questionable sepsis - evaluate for abnormality EXAM: PORTABLE CHEST - 1 VIEW COMPARISON:  June 23, 2020 FINDINGS: Subsegmental atelectasis in the right lung base. No focal airspace consolidation, pleural effusion, or pneumothorax. Borderline cardiomegaly. Aortic atherosclerosis. No acute fracture or destructive lesion. Multilevel thoracic osteophytosis. IMPRESSION: No acute cardiopulmonary abnormality. Electronically Signed   By: Rogelia Myers M.D.   On: 05/04/2024 08:38  DISCHARGE EXAMINATION: Vitals:   05/07/24 1736 05/07/24 2003 05/08/24 0038 05/08/24 0753  BP: (!) 122/94 115/82 120/87 133/86  Pulse: 81 78 72 66  Resp: 20 18 17 17   Temp: 98.2 F (36.8 C) 98.1 F (36.7 C) 98 F (36.7 C) 98 F (36.7 C)  TempSrc:      SpO2: 94% 96% 96% 96%  Weight:      Height:       General appearance: Awake alert.  In no distress Resp: Normal effort at rest.  Improved aeration bilaterally with a few crackles at the bases.  No wheezing or rhonchi. Cardio: S1-S2 is normal regular.  No S3-S4.  No rubs murmurs or bruit GI: Abdomen is soft.  Nontender nondistended.  Bowel sounds are present normal.  No masses organomegaly   DISPOSITION: Home  Discharge Instructions     Call MD for:  difficulty breathing, headache or visual disturbances   Complete by: As directed     Call MD for:  extreme fatigue   Complete by: As directed    Call MD for:  persistant dizziness or light-headedness   Complete by: As directed    Call MD for:  persistant nausea and vomiting   Complete by: As directed    Call MD for:  severe uncontrolled pain   Complete by: As directed    Call MD for:  temperature >100.4   Complete by: As directed    Diet - low sodium heart healthy   Complete by: As directed    Discharge instructions   Complete by: As directed    Please follow instructions provided by the speech therapist.  Follow-up with your primary care provider within 1 week after discharge.  Seek attention if your symptoms worsen.  You were cared for by a hospitalist during your hospital stay. If you have any questions about your discharge medications or the care you received while you were in the hospital after you are discharged, you can call the unit and asked to speak with the hospitalist on call if the hospitalist that took care of you is not available. Once you are discharged, your primary care physician will handle any further medical issues. Please note that NO REFILLS for any discharge medications will be authorized once you are discharged, as it is imperative that you return to your primary care physician (or establish a relationship with a primary care physician if you do not have one) for your aftercare needs so that they can reassess your need for medications and monitor your lab values. If you do not have a primary care physician, you can call [phone] for a physician referral.   Increase activity slowly   Complete by: As directed          Allergies as of 05/08/2024   No Known Allergies      Medication List     TAKE these medications    amLODipine  2.5 MG tablet Commonly known as: NORVASC  TAKE 1 TABLET BY MOUTH AT BEDTIME.   amoxicillin -clavulanate 875-125 MG tablet Commonly known as: AUGMENTIN  Take 1 tablet by mouth every 12 (twelve) hours for 5 days.    aspirin  EC 81 MG tablet Take 1 tablet (81 mg total) by mouth daily. Swallow whole.   docusate sodium  100 MG capsule Commonly known as: COLACE Take 100 mg by mouth daily.   DULoxetine  60 MG capsule Commonly known as: CYMBALTA  TAKE 1 CAPSULE BY MOUTH EVERY DAY   esomeprazole  40 MG capsule Commonly known as: NEXIUM  TAKE  1 CAPSULE (40 MG TOTAL) BY MOUTH 2 (TWO) TIMES DAILY BEFORE A MEAL.   lovastatin  20 MG tablet Commonly known as: MEVACOR  TAKE 1 TABLET BY MOUTH EVERYDAY AT BEDTIME   Melatonin 10 MG Tabs Take 1 tablet by mouth at bedtime.   olmesartan  20 MG tablet Commonly known as: BENICAR  TAKE 1 TABLET BY MOUTH EVERY DAY   Oxycodone  HCl 10 MG Tabs Take 1 tablet (10 mg total) by mouth 2 (two) times daily.   traZODone  50 MG tablet Commonly known as: DESYREL  TAKE 1 TABLET BY MOUTH EVERYDAY AT BEDTIME               Durable Medical Equipment  (From admission, onward)           Start     Ordered   05/06/24 1038  For home use only DME oxygen  Once       Question Answer Comment  Length of Need Lifetime   Mode or (Route) Nasal cannula   Liters per Minute 4   Frequency Continuous (stationary and portable oxygen unit needed)   Oxygen conserving device Yes   Oxygen delivery system Gas      05/06/24 1037              Follow-up Information     [facility].   Specialty: Home Health Services Why: [facility] will provide home health services.  They will call you in the next 24-48 hours to set up services. Contact information: [address] [place] [address] [phone]         [place], [name], MD. Schedule an appointment as soon as possible for a visit in 1 week(s).   Specialty: Family Medicine Why: post hospitalization follow up Contact information: [address] [place] [place] [address] [phone]                 TOTAL DISCHARGE TIME: 35 minutes  Artesha Wemhoff Verdene  [facility] Pager on [email or web address]  05/08/2024, 11:03 AM

## 2024-05-08 NOTE — Progress Notes (Signed)
 Speech Language Pathology Treatment: Dysphagia  Patient Details Name: Richard Vaughn MRN: [id number] DOB: Jan 24, 1951 Today's Date: 05/08/2024 Time: 1000-1030 SLP Time Calculation (min) (ACUTE ONLY): 30 min  Assessment / Plan / Recommendation Clinical Impression  Reviewed results of MBS with pt and spouse. Recommended pt continue foods and thin liquids, prior reflux precautions, oral care before meals, primarily water, use preventative coughing and throat clearing, continue EMST, follow up with OP SLP to address lingual control. Provided a Provale cup and explained benefit of reducing bolus size. Gave instructions for drinking pts favorite drink - Cheerwine; advised once a day, with provale cup, reduce distractions, use preventative coughing and throat clearing. Demonstrated EMST set to 35 cm H20 - 30 reps, 3 x a day. Provided written instruction.   HPI HPI: 73 yo male adm to [facility] with shortness of breath and cough with brown sputum over last several days.  PMH + for CVA, spastic left-sided hemiplegia, GERD, Barrett's esophagus, chronic pain presenting with cough. Chest CT  Right lower lobe consolidation and irregular consolidation within the right middle lobe, likely pneumonia. 3. Layering secretions within the trachea extending into bilateral airways with multifocal subsegmental mucous plugging.  Pt has undergone prior MBS study that showed silent aspiration of thin due to decreased adequacy of airway closure and timing, as well as penetration of nectar liquids.  He reports he continues with reflux medication use and denies coughing significantly during intake.  Had previously seen OP SLP for dysphagia/dysphonia and conducted RMST - Reports he no longer uses this EMST device.  Denies requiring heimlich manuever nor having recurrent pneumonias.  Reports poor appetite.      SLP Plan  Continue with current plan of care          Recommendations  Diet recommendations: Regular;Thin liquid Liquids  provided via: Cup (provale cup) Medication Administration: Whole meds with puree Supervision: Patient able to self feed Compensations: Slow rate;Small sips/bites;Clear throat intermittently;Hard cough after swallow;Follow solids with liquid Postural Changes and/or Swallow Maneuvers: Seated upright 90 degrees;Upright 30-60 min after meal                  Oral care prior to ice chip/H20     Dysphagia, oral phase (R13.11);Dysphagia, oropharyngeal phase (R13.12)     Continue with current plan of care     Kuzey Ogata, Consuelo Fitch  05/08/2024, 10:53 AM

## 2024-05-08 NOTE — Progress Notes (Signed)
 Explained discharge instructions to patient. Reviewed follow up appointment and next medication administration times. Also reviewed education. Patient verbalized having an understanding for instructions given. All belongings are in the patient's possession. IV and telemetry were removed. CCMD was notified. No other needs verbalized. Vol to transport downstairs for discharge.

## 2024-05-09 ENCOUNTER — Telehealth: Payer: Self-pay

## 2024-05-09 ENCOUNTER — Encounter: Payer: Self-pay | Admitting: Family Medicine

## 2024-05-09 ENCOUNTER — Ambulatory Visit (INDEPENDENT_AMBULATORY_CARE_PROVIDER_SITE_OTHER): Admitting: Family Medicine

## 2024-05-09 VITALS — BP 120/80 | HR 65 | Resp 16 | Ht 75.0 in

## 2024-05-09 DIAGNOSIS — J181 Lobar pneumonia, unspecified organism: Secondary | ICD-10-CM | POA: Diagnosis not present

## 2024-05-09 DIAGNOSIS — I10 Essential (primary) hypertension: Secondary | ICD-10-CM | POA: Diagnosis not present

## 2024-05-09 DIAGNOSIS — G4700 Insomnia, unspecified: Secondary | ICD-10-CM

## 2024-05-09 DIAGNOSIS — I77810 Thoracic aortic ectasia: Secondary | ICD-10-CM | POA: Diagnosis not present

## 2024-05-09 DIAGNOSIS — G894 Chronic pain syndrome: Secondary | ICD-10-CM | POA: Diagnosis not present

## 2024-05-09 DIAGNOSIS — R471 Dysarthria and anarthria: Secondary | ICD-10-CM

## 2024-05-09 LAB — CULTURE, BLOOD (ROUTINE X 2)
Culture: NO GROWTH
Culture: NO GROWTH
Special Requests: ADEQUATE
Special Requests: ADEQUATE

## 2024-05-09 NOTE — Transitions of Care (Post Inpatient/ED Visit) (Signed)
   05/09/2024  Name: Richard Vaughn MRN: [id number] DOB: 15-Jul-1951  Today's TOC FU Call Status: Today's TOC FU Call Status:: Unsuccessful Call (1st Attempt) Unsuccessful Call (1st Attempt) Date: 05/09/24  Attempted to reach the patient regarding the most recent Inpatient/ED visit.  Follow Up Plan: Additional outreach attempts will be made to reach the patient to complete the Transitions of Care (Post Inpatient/ED visit) call.   Shona Prow RN, CCM Nina  VBCI-Population Health RN Care Manager [phone]

## 2024-05-09 NOTE — Progress Notes (Signed)
 HPI: Richard Vaughn is a 73 y.o. male ambulating with a can today and has a PMHx significant for insomnia, chronic pain, HTN, OSA, GERD, HLD, CVA, depression, and many others besides those, who is here with his wife today for chronic disease management and hospital follow-up 6/20-6/24.  Last seen on 12/05/2023, in the interim has seen Podiatry, [facility], and the ED. TOC call on 05/09/24.  Hospital Follow-up//PNA Seen in ED on 6/20 c/o cough and fatigue x3-4 days with associated expectoration of brown sputum, DOE, fever/chills, and generalized myalgias. Following discovery of aspiration PNA in his right lung, complicated with acute respiratory failure with hypoxia, son he was admitted. Hospitalized from 05/04/2024 to 05/08/2024. Initially treated with IV antibiotic and discharged with Augmentin , which he is still taking.  He was also provided with a flutter valve to use twice daily for pulmonary congestion/atelectasis.   Chronic medications were not changed.  Denies fever, chills, SOB, CP, or wheezing. Endorses cough still, does feel like there is mucus that he can not expectorate.  He was also provided with a respiratory muscle trainer to use three times daily.  Says that his appetite is still decreased.  Referral to [facility] placed, he is scheduled to start PT this coming Friday, according to his wife.   On CXR: No acute cardiopulmonary abnormality.  On CTA:  1. No evidence of pulmonary embolism to the level of distal segmental pulmonary arteries. 2. Right lower lobe consolidation and irregular consolidation within the right middle lobe, likely pneumonia. 3. Layering secretions within the trachea extending into bilateral airways with multifocal subsegmental mucous plugging. 4. Enlarged AP window lymph node, likely reactive. 5. Ascending Thoracic Aorta measures 4.1 cm. 6. Aortic Atherosclerosis  Hypertension:  Medications: Amlodipine  2.5 mg at bedtime and Olmesartan  20 mg daily.  BP  readings at home: around 120/80  Negative for unusual or severe headache, visual changes, exertional chest pain, dyspnea, new focal weakness, or edema. Lab Results  Component Value Date   CREATININE 1.15 05/07/2024   BUN 9 05/07/2024   NA 137 05/07/2024   K 3.9 05/07/2024   CL 108 05/07/2024   CO2 22 05/07/2024   BP Readings from Last 3 Encounters:  05/09/24 120/80  05/08/24 133/86  01/19/24 (!) 141/67   Chronic Pain//Depression: Generalized arthralgias, especially left shoulder, and lower back pain radiated to right lower extremity, exacerbated by movement and alleviated by rest. Pain is constant, achy pain 5-6/10. Reports that lower back pain resulted from frequent heavy lifting at work, [employer].  He was following with pain management, Dr. Jani, until he retired.  Managed on Oxycodone  10 mg twice daily and Duloxetine  60 mg daily. Medication is still helping with pain. He has tolerated well, denies side effects. Denies constipation.  Insomnia: Managed with Trazodone  50 mg at bedtime.  Says that he has been sleeping 8 hours/ night.   Review of Systems  Constitutional:  Positive for appetite change (still decreased). Negative for chills and fever.  HENT:  Positive for congestion (chest). Negative for sore throat and trouble swallowing.   Respiratory:  Positive for cough (w/ attempts of expectoration). Negative for shortness of breath and wheezing.   Gastrointestinal:  Negative for abdominal pain, nausea and vomiting.  Genitourinary:  Negative for decreased urine volume, difficulty urinating, dysuria, enuresis, frequency and urgency.  Musculoskeletal:  Positive for arthralgias, back pain and gait problem.  Skin:  Negative for rash.  Neurological:  Negative for syncope and facial asymmetry.  Psychiatric/Behavioral:  Negative for confusion  and hallucinations.   See other pertinent positives and negatives in HPI.  Current Outpatient Medications on File Prior to Visit   Medication Sig Dispense Refill   amLODipine  (NORVASC ) 2.5 MG tablet TAKE 1 TABLET BY MOUTH AT BEDTIME. 90 tablet 1   amoxicillin -clavulanate (AUGMENTIN ) 875-125 MG tablet Take 1 tablet by mouth every 12 (twelve) hours for 5 days. 10 tablet 0   aspirin  EC 81 MG tablet Take 1 tablet (81 mg total) by mouth daily. Swallow whole. 30 tablet 11   docusate sodium  (COLACE) 100 MG capsule Take 100 mg by mouth daily.     DULoxetine  (CYMBALTA ) 60 MG capsule TAKE 1 CAPSULE BY MOUTH EVERY DAY 90 capsule 3   esomeprazole  (NEXIUM ) 40 MG capsule TAKE 1 CAPSULE (40 MG TOTAL) BY MOUTH 2 (TWO) TIMES DAILY BEFORE A MEAL. 180 capsule 1   lovastatin  (MEVACOR ) 20 MG tablet TAKE 1 TABLET BY MOUTH EVERYDAY AT BEDTIME 90 tablet 1   Melatonin 10 MG TABS Take 1 tablet by mouth at bedtime.     olmesartan  (BENICAR ) 20 MG tablet TAKE 1 TABLET BY MOUTH EVERY DAY 90 tablet 3   Oxycodone  HCl 10 MG TABS Take 1 tablet (10 mg total) by mouth 2 (two) times daily. 60 tablet 0   traZODone  (DESYREL ) 50 MG tablet TAKE 1 TABLET BY MOUTH EVERYDAY AT BEDTIME 90 tablet 2   No current facility-administered medications on file prior to visit.    Past Medical History:  Diagnosis Date   Barrett's esophagus    Cerebrovascular accident (CVA) due to thrombosis of left carotid artery (HCC) 09/23/2015   Chronic back pain    CVA (cerebral vascular accident) (HCC) 09/22/2016   Depression    Gastritis    Mild   Gastroparesis    GERD (gastroesophageal reflux disease)    Headache    History of kidney stones    Hyperglycemia    Hypertension    ICH (intracerebral hemorrhage) (HCC) - R thalmic/PLIC d/t HTN 93/98/7980   Lacunar infarct, acute (HCC) 08/25/2015   Nephrolithiasis    Pneumonia    Covid pneumonia   Renal cyst    Sleep apnea    Stricture and stenosis of esophagus    Stroke (HCC) 07/2015   Stroke-like symptom 09/22/2016   Treatment-emergent central sleep apnea 10/25/2018   Vision abnormalities    No Known Allergies  Social  History   Socioeconomic History   Marital status: Married    Spouse name: Christophor Eick   Number of children: 3   Years of education: Not on file   Highest education level: Some college, no degree  Occupational History   Occupation: retired    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[employer]  Tobacco Use   Smoking status: Former    Current packs/day: 0.00    Average packs/day: 0.5 packs/day for 30.0 years (15.0 ttl pk-yrs)    Types: Cigarettes    Start date: 04/15/1988    Quit date: 04/15/2018    Years since quitting: 6.0   Smokeless tobacco: Never  Vaping Use   Vaping status: Never Used  Substance and Sexual Activity   Alcohol use: No    Alcohol/week: 0.0 standard drinks of alcohol   Drug use: Never    Comment: last use 04/2018   Sexual activity: Yes  Other Topics Concern   Not on file  Social History Narrative   Not on file   Social Drivers of Health   Financial Resource Strain: Low Risk  (04/27/2024)   Overall Financial  Resource Strain (CARDIA)    Difficulty of Paying Living Expenses: Not hard at all  Food Insecurity: No Food Insecurity (05/05/2024)   Hunger Vital Sign    Worried About Running Out of Food in the Last Year: Never true    Ran Out of Food in the Last Year: Never true  Transportation Needs: No Transportation Needs (05/05/2024)   PRAPARE - Administrator, Civil Service (Medical): No    Lack of Transportation (Non-Medical): No  Physical Activity: Sufficiently Active (04/27/2024)   Exercise Vital Sign    Days of Exercise per Week: 5 days    Minutes of Exercise per Session: 60 min  Stress: No Stress Concern Present (04/27/2024)   [employer] of Occupational Health - Occupational Stress Questionnaire    Feeling of Stress: Not at all  Social Connections: Moderately Isolated (05/05/2024)   Social Connection and Isolation Panel    Frequency of Communication with Friends and Family: More than three times a week    Frequency of Social Gatherings with Friends and Family:  More than three times a week    Attends Religious Services: Never    Database administrator or Organizations: No    Attends Banker Meetings: Never    Marital Status: Married    Vitals:   05/09/24 1438  BP: 120/80  Pulse: 65  Resp: 16  SpO2: 98%   Body mass index is 32.49 kg/m.  Physical Exam Vitals and nursing note reviewed.  Constitutional:      General: He is not in acute distress.    Appearance: He is well-developed.  HENT:     Head: Normocephalic and atraumatic.     Mouth/Throat:     Mouth: Mucous membranes are moist.   Eyes:     Conjunctiva/sclera: Conjunctivae normal.    Cardiovascular:     Rate and Rhythm: Normal rate and regular rhythm.     Heart sounds: No murmur heard.    Comments: PT pulses palpable. Pulmonary:     Effort: Pulmonary effort is normal. No respiratory distress.     Breath sounds: Normal breath sounds.  Abdominal:     Palpations: Abdomen is soft. There is no mass.     Tenderness: There is no abdominal tenderness.   Musculoskeletal:     Right lower leg: No edema.     Left lower leg: No edema.   Skin:    General: Skin is warm.     Findings: No erythema or rash.   Neurological:     Mental Status: He is alert and oriented to person, place, and time.     Cranial Nerves: No cranial nerve deficit.     Gait: Gait normal.     Comments: Dysarthria. Left-sided weakness and slurred speech residual from CVA. Unstable gait assisted by a cane.  Psychiatric:        Mood and Affect: Mood and affect normal.   ASSESSMENT AND PLAN: Mr. Defalco was seen today regarding his recent hospitalization.  Lobar pneumonia (HCC) Right lower lobe pneumonia with mucous plug. He reports significant improvement of symptoms. Currently on Augmentin  875-125 mg twice daily to complete 5 days. Continue incentive leg spirometry with device provided at hospital discharge. Monitor for fever. Instructed about warning signs.  Ascending aorta dilation  [facility]) Assessment & Plan: Ascending thoracic aorta measures 4.1 cm seen on chest CTA done on 05/04/2024. Stressed the importance of adequate BP control.  We will plan on repeating chest CTA in 04/2025.  Essential hypertension Assessment & Plan: BP adequately controlled. Continue Amlodipine  2.5 mg daily and Olmesartan  20 mg daily. Low-salt/DASH diet to continue. Continue monitoring BP regularly. F/U in 5-6 months.  Chronic pain syndrome Assessment & Plan: Pain is adequately controlled. Continue oxycodone  10 mg twice daily. PDMP reviewed Medication contract is current. Follow-up in 5-6 months, before if needed.  Insomnia, unspecified type Assessment & Plan: Problem is well-controlled. Continue Trazodone  50 mg daily at bedtime. Good sleep hygiene also recommended. Follow-up in 6 months, before if needed.  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 At high risk for broncho aspiration. He has been evaluated by speech therapy and general recommendations given.   Return in about 6 months (around 10/26/2024) for chronic problems.  I,Emily Lagle,acting as a Neurosurgeon for Makaiah Terwilliger [place], MD.,have documented all relevant documentation on the behalf of Ayerim Berquist [place], MD,as directed by  Laura Caldas [place], MD while in the presence of Colton Engdahl [place], MD.   I, Edvardo Honse [place], MD, have reviewed all documentation for this visit. The documentation on 05/09/24 for the exam, diagnosis, procedures, and orders are all accurate and complete. Affan Callow G. [place], MD  [facility]. [place] office.

## 2024-05-09 NOTE — Assessment & Plan Note (Signed)
 Problem is well-controlled. Continue Trazodone  50 mg daily at bedtime. Good sleep hygiene also recommended. Follow-up in 6 months, before if needed.

## 2024-05-09 NOTE — Assessment & Plan Note (Signed)
 Pain is adequately controlled. Continue oxycodone  10 mg twice daily. PDMP reviewed Medication contract is current. Follow-up in 5-6 months, before if needed.

## 2024-05-09 NOTE — Assessment & Plan Note (Signed)
 Residual after CVA. At high risk for broncho aspiration. He has been evaluated by speech therapy and general recommendations given.

## 2024-05-09 NOTE — Assessment & Plan Note (Addendum)
 BP adequately controlled. Continue Amlodipine  2.5 mg daily and Olmesartan  20 mg daily. Low-salt/DASH diet to continue. Continue monitoring BP regularly. F/U in 5-6 months.

## 2024-05-09 NOTE — Assessment & Plan Note (Signed)
 Ascending thoracic aorta measures 4.1 cm seen on chest CTA done on 05/04/2024. Stressed the importance of adequate BP control.  We will plan on repeating chest CTA in 04/2025.

## 2024-05-09 NOTE — Telephone Encounter (Signed)
 Copied from CRM [id number]. Topic: Referral - Status >> May 09, 2024 12:28 PM Chiquita SQUIBB wrote: Reason for CRM: [facility] is calling because they received a referral for the patient for PT and OT, they are calling to let the doctor know they are going out on the 27th for assessment.

## 2024-05-09 NOTE — Patient Instructions (Addendum)
 A few things to remember from today's visit:  Lobar pneumonia (HCC)  Essential hypertension  Chronic pain syndrome  Insomnia, unspecified type  Dysarthria  Mucus plugging of bronchi  Complete antibiotic use. Increase activity as tolerated. Continue using respiratory devices as instructed. No changes today.  If you need refills for medications you take chronically, please call your pharmacy. Do not use My Chart to request refills or for acute issues that need immediate attention. If you send a my chart message, it may take a few days to be addressed, specially if I am not in the office.  Please be sure medication list is accurate. If a new problem present, please set up appointment sooner than planned today.

## 2024-05-10 ENCOUNTER — Telehealth: Payer: Self-pay

## 2024-05-10 NOTE — Transitions of Care (Post Inpatient/ED Visit) (Signed)
   05/10/2024  Name: Richard Vaughn MRN: [id number] DOB: 10-20-1951  Today's TOC FU Call Status: Today's TOC FU Call Status:: Successful TOC FU Call Completed TOC FU Call Complete Date: 05/10/24 Patient's Name and Date of Birth confirmed.  Transition Care Management Follow-up Telephone Call How have you been since you were released from the hospital?: Better (doing beter) Any questions or concerns?: No  Items Reviewed: Did you receive and understand the discharge instructions provided?: Yes  Placed call to patient today.  Spoke with wife with patients permission.  Patient able to verbalize his name and DOB.   Wife, who is a Engineer, civil (consulting), reports that she understands all the discharge instructions. Patient already had an MD appointment with PCP scheduled for 06/25/20205.  Wife reports all instructions, medications and follow ups reviewed with PCP yesterday and declines need to review again.   I provided my contact information if wife need to call me back. She reports she is aware to call PCP office as well. Wife denies any needs at this time.   TOC and assessments not completed per decline of wife-  again voices understanding and no questions.   Alan Ee, RN, BSN, CEN [employer]- Transition of Care Team.  [facility] [phone]

## 2024-05-17 ENCOUNTER — Telehealth: Payer: Self-pay | Admitting: *Deleted

## 2024-05-17 NOTE — Telephone Encounter (Signed)
 Reason for CRM: Received call from Lorie, Physical Therapist, with [facility].        Lorie states patient requested No visit today for physical therapy.        Per Cherlyn, wanted to report to provider, patient did not have visit for physical therapy today.        If there are any questions or concerns, she can be reached at [phone].

## 2024-05-21 NOTE — Telephone Encounter (Signed)
 FYI

## 2024-06-04 ENCOUNTER — Other Ambulatory Visit: Payer: Self-pay | Admitting: Family Medicine

## 2024-06-04 DIAGNOSIS — G894 Chronic pain syndrome: Secondary | ICD-10-CM

## 2024-06-04 DIAGNOSIS — G8929 Other chronic pain: Secondary | ICD-10-CM

## 2024-06-04 MED ORDER — OXYCODONE HCL 10 MG PO TABS: 10.0000 mg | ORAL_TABLET | Freq: Two times a day (BID) | ORAL | 0 refills | Status: DC

## 2024-06-04 NOTE — Telephone Encounter (Signed)
 Copied from CRM [id number]. Topic: Clinical - Medication Refill >> Jun 04, 2024 10:34 AM Montie POUR wrote: Medication: Oxycodone  HCl 10 MG TABS  Has the patient contacted their pharmacy? Yes (Agent: If no, request that the patient contact the pharmacy for the refill. If patient does not wish to contact the pharmacy document the reason why and proceed with request.) (Agent: If yes, when and what did the pharmacy advise?) Pharmacy needs order to refill  This is the patient's preferred pharmacy:   [id number] - [place], [place] - [address] AT [address]NER OF [facility] [address] [place] [address] Phone: [phone] Fax: [phone]  Is this the correct pharmacy for this prescription? Yes If no, delete pharmacy and type the correct one.   Has the prescription been filled recently? No  Is the patient out of the medication? No  Has the patient been seen for an appointment in the last year OR does the patient have an upcoming appointment? Yes  Can we respond through MyChart? Yes  Agent: Please be advised that Rx refills may take up to 3 business days. We ask that you follow-up with your pharmacy.

## 2024-06-07 ENCOUNTER — Telehealth: Payer: Self-pay | Admitting: *Deleted

## 2024-06-07 NOTE — Telephone Encounter (Signed)
 Copied from CRM #[id number]. Topic: Clinical - Medication Question >> Jun 07, 2024  1:45 PM Deaijah H wrote: Reason for CRM: Erin PT w/ [facility] called in wanting to see if Dr. Candee nurse to send AFO prescription to ortho or prosthetics. Stated he has left foot dropping. Please [phone]    ----------------------------------------------------------------------- From previous Reason for Contact - Home Health Verbal Orders: Caller/Agency:  Callback Number:  Service Requested:   Frequency:  Any new concerns about the patient?

## 2024-06-08 NOTE — Telephone Encounter (Signed)
 I left Richard Vaughn a message to call the office back. Need to know if pt needs a referral to Ortho.

## 2024-06-08 NOTE — Telephone Encounter (Signed)
 I spoke with Richard Vaughn, she will contact pt's podiatrist.

## 2024-06-08 NOTE — Telephone Encounter (Signed)
 It is ok to place ortho referral, if he does not have one yet. Thanks, BJ

## 2024-07-10 ENCOUNTER — Other Ambulatory Visit: Payer: Self-pay | Admitting: Family Medicine

## 2024-07-10 DIAGNOSIS — G8929 Other chronic pain: Secondary | ICD-10-CM

## 2024-07-10 DIAGNOSIS — G894 Chronic pain syndrome: Secondary | ICD-10-CM

## 2024-07-10 NOTE — Telephone Encounter (Signed)
 Copied from CRM [id number]. Topic: Clinical - Medication Refill >> Jul 10, 2024 10:01 AM Ismael A wrote: Medication:  Oxycodone  HCl 10 MG TABS    Has the patient contacted their pharmacy? No (Agent: If no, request that the patient contact the pharmacy for the refill. If patient does not wish to contact the pharmacy document the reason why and proceed with request.) (Agent: If yes, when and what did the pharmacy advise?)  This is the patient's preferred pharmacy:   [id number] - [place], [place] - [address] AT [address] OF [facility] [address] [place]  [address] Phone: [phone] Fax: [phone]  Is this the correct pharmacy for this prescription? Yes If no, delete pharmacy and type the correct one.   Has the prescription been filled recently? No  Is the patient out of the medication? Yes  Has the patient been seen for an appointment in the last year OR does the patient have an upcoming appointment? Yes  Can we respond through MyChart? Yes  Agent: Please be advised that Rx refills may take up to 3 business days. We ask that you follow-up with your pharmacy.

## 2024-07-10 NOTE — Telephone Encounter (Unsigned)
 Copied from CRM [id number]. Topic: Clinical - Medication Refill >> Jul 10, 2024 10:01 AM Ismael A wrote: Medication:  Oxycodone  HCl 10 MG TABS    Has the patient contacted their pharmacy? No (Agent: If no, request that the patient contact the pharmacy for the refill. If patient does not wish to contact the pharmacy document the reason why and proceed with request.) (Agent: If yes, when and what did the pharmacy advise?)  This is the patient's preferred pharmacy:   [id number] - [place], [place] - [address] AT [address] [facility] [address] [place]  [address] Phone: [phone] Fax: [phone]  Is this the correct pharmacy for this prescription? Yes If no, delete pharmacy and type the correct one.   Has the prescription been filled recently? No  Is the patient out of the medication? Yes  Has the patient been seen for an appointment in the last year OR does the patient have an upcoming appointment? Yes  Can we respond through MyChart? Yes  Agent: Please be advised that Rx refills may take up to 3 business days. We ask that you follow-up with your pharmacy.

## 2024-07-10 NOTE — Telephone Encounter (Signed)
 Duplicate, request already sent to PCP

## 2024-07-11 MED ORDER — OXYCODONE HCL 10 MG PO TABS: 10.0000 mg | ORAL_TABLET | Freq: Two times a day (BID) | ORAL | 0 refills | Status: DC

## 2024-07-12 ENCOUNTER — Encounter: Payer: Self-pay | Admitting: Podiatry

## 2024-07-12 ENCOUNTER — Ambulatory Visit (INDEPENDENT_AMBULATORY_CARE_PROVIDER_SITE_OTHER): Admitting: Podiatry

## 2024-07-12 DIAGNOSIS — M79675 Pain in left toe(s): Secondary | ICD-10-CM

## 2024-07-12 DIAGNOSIS — M79674 Pain in right toe(s): Secondary | ICD-10-CM | POA: Diagnosis not present

## 2024-07-12 DIAGNOSIS — I639 Cerebral infarction, unspecified: Secondary | ICD-10-CM

## 2024-07-12 DIAGNOSIS — I739 Peripheral vascular disease, unspecified: Secondary | ICD-10-CM

## 2024-07-12 DIAGNOSIS — B351 Tinea unguium: Secondary | ICD-10-CM | POA: Diagnosis not present

## 2024-07-12 NOTE — Progress Notes (Signed)
 This patient returns to my office for at risk foot care.  This patient requires this care by a professional since this patient will be at risk due to having  CVA, PAD and hyperglycemia. He presents to the office with his wife.  This patient is unable to cut nails himself since the patient cannot reach his nails.These nails are painful walking and wearing shoes.  This patient presents for at risk foot care today.  General Appearance  Alert, conversant and in no acute stress.  Vascular  Dorsalis pedis and posterior tibial  pulses are palpable  bilaterally.  Capillary return is within normal limits  bilaterally. Temperature is within normal limits  bilaterally.  Neurologic  Senn-Weinstein monofilament wire test within normal limits  bilaterally. Muscle power within normal limits bilaterally.  Nails Thick disfigured discolored nails with subungual debris  from hallux to fifth toes bilaterally. No evidence of bacterial infection or drainage bilaterally.  Orthopedic  No limitations of motion  feet .  No crepitus or effusions noted.  Hammer toes  B/l.  Skin  normotropic skin with no porokeratosis noted bilaterally.  No signs of infections or ulcers noted.     Onychomycosis  Pain in right toes  Pain in left toes  Consent was obtained for treatment procedures.   Mechanical debridement of nails 1-5  bilaterally performed with a nail nipper.  Filed with dremel without incident.    Return office visit    10 weeks                 Told patient to return for periodic foot care and evaluation due to potential at risk complications.   Helane Gunther DPM

## 2024-08-06 ENCOUNTER — Telehealth: Payer: Self-pay | Admitting: *Deleted

## 2024-08-06 ENCOUNTER — Other Ambulatory Visit: Payer: Self-pay | Admitting: Physician Assistant

## 2024-08-06 DIAGNOSIS — R6881 Early satiety: Secondary | ICD-10-CM

## 2024-08-06 DIAGNOSIS — R1312 Dysphagia, oropharyngeal phase: Secondary | ICD-10-CM

## 2024-08-06 DIAGNOSIS — K2270 Barrett's esophagus without dysplasia: Secondary | ICD-10-CM

## 2024-08-06 NOTE — Telephone Encounter (Signed)
 Copied from CRM [id number]. Topic: Clinical - Medical Advice >> Aug 06, 2024 10:57 AM Anairis L wrote: Reason for CRM: Wife is calling in because pt BP has been elevated. Last few reading 135/103, 145/78, 138/100, 152/104. She has an app today and wants to know if its ok to leave him. I scheduled him an app for tomorrow with [name] . Thank you.

## 2024-08-07 ENCOUNTER — Ambulatory Visit: Payer: Self-pay | Admitting: Family Medicine

## 2024-08-07 ENCOUNTER — Ambulatory Visit (INDEPENDENT_AMBULATORY_CARE_PROVIDER_SITE_OTHER): Admitting: Family Medicine

## 2024-08-07 ENCOUNTER — Encounter: Payer: Self-pay | Admitting: Family Medicine

## 2024-08-07 VITALS — BP 120/80 | HR 104 | Temp 98.5°F | Resp 16 | Ht 75.0 in | Wt 246.2 lb

## 2024-08-07 DIAGNOSIS — I10 Essential (primary) hypertension: Secondary | ICD-10-CM | POA: Diagnosis not present

## 2024-08-07 DIAGNOSIS — R350 Frequency of micturition: Secondary | ICD-10-CM | POA: Diagnosis not present

## 2024-08-07 DIAGNOSIS — K219 Gastro-esophageal reflux disease without esophagitis: Secondary | ICD-10-CM

## 2024-08-07 DIAGNOSIS — R000 Tachycardia, unspecified: Secondary | ICD-10-CM

## 2024-08-07 DIAGNOSIS — K5900 Constipation, unspecified: Secondary | ICD-10-CM

## 2024-08-07 LAB — CBC
HCT: 46.9 % (ref 39.0–52.0)
Hemoglobin: 15.8 g/dL (ref 13.0–17.0)
MCHC: 33.7 g/dL (ref 30.0–36.0)
MCV: 89.5 fl (ref 78.0–100.0)
Platelets: 272 K/uL (ref 150.0–400.0)
RBC: 5.24 Mil/uL (ref 4.22–5.81)
RDW: 14.1 % (ref 11.5–15.5)
WBC: 9.3 K/uL (ref 4.0–10.5)

## 2024-08-07 LAB — BASIC METABOLIC PANEL WITH GFR
BUN: 16 mg/dL (ref 6–23)
CO2: 24 meq/L (ref 19–32)
Calcium: 10 mg/dL (ref 8.4–10.5)
Chloride: 104 meq/L (ref 96–112)
Creatinine, Ser: 1.08 mg/dL (ref 0.40–1.50)
GFR: 69.4 mL/min (ref >60.00)
Glucose, Bld: 104 mg/dL — ABNORMAL HIGH (ref 70–99)
Potassium: 3.8 meq/L (ref 3.5–5.1)
Sodium: 138 meq/L (ref 135–145)

## 2024-08-07 LAB — TSH: TSH: 1.25 u[IU]/mL (ref 0.35–5.50)

## 2024-08-07 NOTE — Patient Instructions (Addendum)
 A few things to remember from today's visit:  Essential hypertension - Plan: CBC  Urinary frequency - Plan: Urinalysis with Culture Reflex  Constipation, unspecified constipation type - Plan: CBC, Basic metabolic panel with GFR, TSH  Sinus tachycardia - Plan: TSH Miralax  daily for 5 days then every other day. No changes in blood pressure meds. Blood pressure readings and heart rates in 2 weeks.  If you need refills for medications you take chronically, please call your pharmacy. Do not use My Chart to request refills or for acute issues that need immediate attention. If you send a my chart message, it may take a few days to be addressed, specially if I am not in the office.  Please be sure medication list is accurate. If a new problem present, please set up appointment sooner than planned today.

## 2024-08-07 NOTE — Assessment & Plan Note (Signed)
 BP check twice and adequately controlled. For now no changes in current management, continue amlodipine  2.5 mg daily and olmesartan  20 mg daily as well as low-salt diet. Recommend monitoring BP daily in the morning after emptying bladder and at night before the next, let me know about BP readings in 2 weeks. Instructed about warning signs.

## 2024-08-07 NOTE — Progress Notes (Signed)
 k   Chief Complaint  Patient presents with   Hypertension   Discussed the use of AI scribe software for clinical note transcription with the patient, who gave verbal consent to proceed.  History of Present Illness Richard Vaughn is a 73 year old male with a PMHx significant for insomnia, chronic pain, HTN, OSA, GERD, HLD, CVA, and depression  who presents with his wife concerned about issues related to blood pressure management. He was last seen on 05/09/2024.  Currently he is on amlodipine  2.5 mg daily and olmesartan  20 mg daily. He has been experiencing fluctuations in blood pressure since discontinuing  Flomax  0.4 mg.  Since August 05, 2024, blood pressure readings have been elevated, with values such as 145/78 and 135/103. His pulse has been elevated in the 90s, whereas it is usually in the 50s to 60s.  His wife thinks he has not been hydrating well. Negative for unusual or severe headache, visual changes, exertional chest pain, dyspnea, new focal weakness, or edema.  Flomax  0.4 mg daily, initially prescribed for incontinence  on 06/28/24 by his urologist but his wife discontinued it on August 04, 2024, due to excessive urination, it seemed to make problem worse. Since stopping the medication, urine frequency is back to baseline. There is a concern for urinary tract infection due to changes in urination patterns, but a home test strip was inconclusive. He has not experienced dysuria, gross hematuria, or pelvic pain.  He has had a few falls since his last visit. ED evaluation on 07/31/2024 after fall on July 27, 2024, resulting in a compression fracture of the L2 vertebra.  He is following with orthopedics, seen on 07/31/2024, lumbar MRI is scheduled. He has experienced multiple falls since then, including one last night while adjusting in bed. These falls have impacted his ability to return to the gym.  He has been experiencing decreased appetite and fullness sensation.  His wife  mentions that he ran out of esomeprazole  40 mg, taking an over-the-counter version. He had nausea once, no vomiting or heartburn.His appetite improved slightly this morning.    He has been constipated, with his last bowel movement occurring three days ago after taking Miralax .  No fever, chills, blood in stool, melena,or abdominal pain. No sick contact.  Lab Results  Component Value Date   NA 137 05/07/2024   CL 108 05/07/2024   K 3.9 05/07/2024   CO2 22 05/07/2024   BUN 9 05/07/2024   CREATININE 1.15 05/07/2024   GFRNONAA >60 05/07/2024   CALCIUM  8.6 (L) 05/07/2024   ALBUMIN 3.1 (L) 05/07/2024   GLUCOSE 102 (H) 05/07/2024   Lab Results  Component Value Date   NA 137 05/07/2024   CL 108 05/07/2024   K 3.9 05/07/2024   CO2 22 05/07/2024   BUN 9 05/07/2024   CREATININE 1.15 05/07/2024   GFRNONAA >60 05/07/2024   CALCIUM  8.6 (L) 05/07/2024   ALBUMIN 3.1 (L) 05/07/2024   GLUCOSE 102 (H) 05/07/2024   Review of Systems  Constitutional:  Positive for activity change and appetite change.  HENT:  Negative for congestion and sore throat.   Genitourinary:  Negative for decreased urine volume.  Musculoskeletal:  Positive for arthralgias, back pain and gait problem.  Skin:  Negative for rash.  Neurological:  Negative for syncope.  Psychiatric/Behavioral:  Negative for confusion and hallucinations.   See other pertinent positives and negatives in HPI.  Current Outpatient Medications on File Prior to Visit  Medication Sig Dispense Refill  amLODipine  (NORVASC ) 2.5 MG tablet TAKE 1 TABLET BY MOUTH AT BEDTIME. 90 tablet 1   aspirin  EC 81 MG tablet Take 1 tablet (81 mg total) by mouth daily. Swallow whole. 30 tablet 11   docusate sodium  (COLACE) 100 MG capsule Take 100 mg by mouth daily.     DULoxetine  (CYMBALTA ) 60 MG capsule TAKE 1 CAPSULE BY MOUTH EVERY DAY 90 capsule 3   esomeprazole  (NEXIUM ) 40 MG capsule TAKE 1 CAPSULE (40 MG TOTAL) BY MOUTH 2 (TWO) TIMES DAILY BEFORE A MEAL. 180  capsule 1   lovastatin  (MEVACOR ) 20 MG tablet TAKE 1 TABLET BY MOUTH EVERYDAY AT BEDTIME 90 tablet 1   Melatonin 10 MG TABS Take 1 tablet by mouth at bedtime.     olmesartan  (BENICAR ) 20 MG tablet TAKE 1 TABLET BY MOUTH EVERY DAY 90 tablet 3   Oxycodone  HCl 10 MG TABS Take 1 tablet (10 mg total) by mouth 2 (two) times daily. 60 tablet 0   traZODone  (DESYREL ) 50 MG tablet TAKE 1 TABLET BY MOUTH EVERYDAY AT BEDTIME 90 tablet 2   No current facility-administered medications on file prior to visit.    Past Medical History:  Diagnosis Date   Barrett's esophagus    Cerebrovascular accident (CVA) due to thrombosis of left carotid artery (HCC) 09/23/2015   Chronic back pain    CVA (cerebral vascular accident) (HCC) 09/22/2016   Depression    Gastritis    Mild   Gastroparesis    GERD (gastroesophageal reflux disease)    Headache    History of kidney stones    Hyperglycemia    Hypertension    ICH (intracerebral hemorrhage) (HCC) - R thalmic/PLIC d/t HTN 93/98/7980   Lacunar infarct, acute (HCC) 08/25/2015   Nephrolithiasis    Pneumonia    Covid pneumonia   Renal cyst    Sleep apnea    Stricture and stenosis of esophagus    Stroke (HCC) 07/2015   Stroke-like symptom 09/22/2016   Treatment-emergent central sleep apnea 10/25/2018   Vision abnormalities    No Known Allergies  Social History   Socioeconomic History   Marital status: Married    Spouse name: Richard Vaughn   Number of children: 3   Years of education: Not on file   Highest education level: Some college, no degree  Occupational History   Occupation: retired    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[employer]  Tobacco Use   Smoking status: Former    Current packs/day: 0.00    Average packs/day: 0.5 packs/day for 30.0 years (15.0 ttl pk-yrs)    Types: Cigarettes    Start date: 04/15/1988    Quit date: 04/15/2018    Years since quitting: 6.3   Smokeless tobacco: Never  Vaping Use   Vaping status: Never Used  Substance and Sexual Activity    Alcohol use: No    Alcohol/week: 0.0 standard drinks of alcohol   Drug use: Never    Comment: last use 04/2018   Sexual activity: Yes  Other Topics Concern   Not on file  Social History Narrative   Not on file   Social Drivers of Health   Financial Resource Strain: Low Risk  (04/27/2024)   Overall Financial Resource Strain (CARDIA)    Difficulty of Paying Living Expenses: Not hard at all  Food Insecurity: No Food Insecurity (05/05/2024)   Hunger Vital Sign    Worried About Running Out of Food in the Last Year: Never true    Ran Out of Food in  the Last Year: Never true  Transportation Needs: No Transportation Needs (05/05/2024)   PRAPARE - Administrator, Civil Service (Medical): No    Lack of Transportation (Non-Medical): No  Physical Activity: Sufficiently Active (04/27/2024)   Exercise Vital Sign    Days of Exercise per Week: 5 days    Minutes of Exercise per Session: 60 min  Stress: No Stress Concern Present (04/27/2024)   [employer] of Occupational Health - Occupational Stress Questionnaire    Feeling of Stress: Not at all  Social Connections: Moderately Isolated (05/05/2024)   Social Connection and Isolation Panel    Frequency of Communication with Friends and Family: More than three times a week    Frequency of Social Gatherings with Friends and Family: More than three times a week    Attends Religious Services: Never    Database administrator or Organizations: No    Attends Banker Meetings: Never    Marital Status: Married    Today's Vitals   08/07/24 0923 08/07/24 0951  BP: 120/72 120/80  Pulse: (!) 130 (!) 104  Resp: 16   Temp: 98.5 F (36.9 C)   TempSrc: Oral   SpO2: 95%   Weight: 246 lb 3.2 oz (111.7 kg)     Body mass index is 30.77 kg/m.  Physical Exam Vitals and nursing note reviewed.  Constitutional:      General: He is not in acute distress.    Appearance: He is well-developed.  HENT:     Head: Normocephalic and  atraumatic.     Mouth/Throat:     Mouth: Mucous membranes are moist.  Eyes:     Conjunctiva/sclera: Conjunctivae normal.  Cardiovascular:     Rate and Rhythm: Regular rhythm. Tachycardia present.     Heart sounds: No murmur heard. Pulmonary:     Effort: Pulmonary effort is normal. No respiratory distress.     Breath sounds: Normal breath sounds.  Abdominal:     Palpations: Abdomen is soft. There is no mass.     Tenderness: There is no abdominal tenderness.  Musculoskeletal:     Right lower leg: No edema.     Left lower leg: No edema.  Lymphadenopathy:     Cervical: No cervical adenopathy.  Skin:    General: Skin is warm.     Findings: No erythema or rash.  Neurological:     Mental Status: He is alert and oriented to person, place, and time.     Comments: Left-sided weakness and slurred speech residual from CVA. Unstable gait assisted by a cane.  Psychiatric:        Mood and Affect: Mood and affect normal.    ASSESSMENT AND PLAN:  Mr Khyson Sebesta  was seen today for hypertension.  Diagnoses and all orders for this visit:  Orders Placed This Encounter  Procedures   Urinalysis with Culture Reflex   CBC   Basic metabolic panel with GFR   TSH   Lab Results  Component Value Date   WBC 9.3 08/07/2024   HGB 15.8 08/07/2024   HCT 46.9 08/07/2024   MCV 89.5 08/07/2024   PLT 272.0 08/07/2024   Lab Results  Component Value Date   NA 138 08/07/2024   CL 104 08/07/2024   K 3.8 08/07/2024   CO2 24 08/07/2024   BUN 16 08/07/2024   CREATININE 1.08 08/07/2024   GFR 69.40 08/07/2024   CALCIUM  10.0 08/07/2024   ALBUMIN 3.1 (L) 05/07/2024   GLUCOSE 104 (  H) 08/07/2024   Lab Results  Component Value Date   TSH 1.25 08/07/2024   Constipation, unspecified constipation type He has had some problems with constipation in the past due to chronic opioid use, it seems to be worse for the past few days. Last bowel movement 3 days ago, recommend taking MiraLAX  daily for a few  days then every other day. Stressed the importance of adequate hydration and fiber intake.  -     CBC; Future -     Basic metabolic panel with GFR; Future -     TSH; Future  Urinary frequency This is a chronic problem, follows with urologist. Flomax  seems to worsen problem, he is no longer taking it. Concerns about possible UTI, so UA ordered today. Monitor for new symptoms.  -     Urinalysis w microscopic + reflex cultur  Essential hypertension Assessment & Plan: BP check twice and adequately controlled. For now no changes in current management, continue amlodipine  2.5 mg daily and olmesartan  20 mg daily as well as low-salt diet. Recommend monitoring BP daily in the morning after emptying bladder and at night before the next, let me know about BP readings in 2 weeks. Instructed about warning signs.  Orders: -     CBC; Future  Sinus tachycardia Initially HR was 130/minute, at the time of discharge 104/minute. At home HR has been in the 90s, which is far from baseline (60s). Continue monitoring HR at home. Increase water intake. Monitor for new symptoms. Instructed about warning signs.  -     TSH; Future  Gastroesophageal reflux disease, unspecified whether esophagitis present Assessment & Plan: Resume Nexium  40 mg 30 minutes before breakfast. Some history of gastroparesis, which could be contributing to his nausea. Continue GERD precautions.   Return if symptoms worsen or fail to improve, for keep next appointment.   Darshana Curnutt [place], MD [facility]. [place] office.

## 2024-08-07 NOTE — Assessment & Plan Note (Signed)
 Resume Nexium  40 mg 30 minutes before breakfast. Some history of gastroparesis, which could be contributing to his nausea. Continue GERD precautions.

## 2024-08-08 ENCOUNTER — Telehealth: Payer: Self-pay | Admitting: Adult Health

## 2024-08-08 LAB — URINALYSIS W MICROSCOPIC + REFLEX CULTURE
Bilirubin Urine: NEGATIVE
Glucose, UA: NEGATIVE
Hgb urine dipstick: NEGATIVE
Ketones, ur: NEGATIVE
Leukocyte Esterase: NEGATIVE
Nitrites, Initial: NEGATIVE
Specific Gravity, Urine: 1.026 (ref 1.001–1.035)
pH: <=5 — AB (ref 5.0–8.0)

## 2024-08-08 LAB — NO CULTURE INDICATED

## 2024-08-08 NOTE — Telephone Encounter (Signed)
 Pt wife called to request to speak to Nurse or MD.Pt wife explained that Pt have been having some strange symptoms and not sure how to address them . Pt wife has stated  that  Pt balance has been off  he has had  3 fall in the mist of 2 weeks  and Pt blood pressure is high  and has lost hit appetite the patient wife is concern  and is requesting a call back

## 2024-08-08 NOTE — Telephone Encounter (Signed)
 Spoke with Pt wife regarding the s/s Pt has been experiencing. Wife stated Pt was prescribed Flomax  on 8/14 by urology, which led to incontinence episodes. The first wk of Sept, wife reported Pt voice went to a whisper but cleared up a few days later. On 9/12 Pt had a fall resulting in L2 compression fx. Has followed up w/spine spec and will have MRI soon. On 9/20 Pt wife stopped the Flomax  d/t increased urination with lots of incontinence issues. Wife also reported Pt BP has been fluctuating with diastolic between 70 to 104. Pt recently has not been drinking a lot and wife is trying to increase his hydration. Pt did see PCP yesterday, who did labs and UA. Per f/u notes from PCP, labs were normal and waiting on UA results. Discussed w/wife starting w/PCP is good and she can let us  know the results of UA, but for now PCP may make recommendations and if PCP thinks Pt should have f/u here earlier than 10/08/24 to call and we will work on getting an earlier appt. Also discussed if Pt has any stroke-like symptoms he needs to be seen urgently in ED. Wife voiced understanding and thanks for discussing with her.

## 2024-08-09 ENCOUNTER — Other Ambulatory Visit: Payer: Self-pay | Admitting: Family Medicine

## 2024-08-09 DIAGNOSIS — G894 Chronic pain syndrome: Secondary | ICD-10-CM

## 2024-08-09 DIAGNOSIS — G8929 Other chronic pain: Secondary | ICD-10-CM

## 2024-08-09 NOTE — Telephone Encounter (Signed)
 Copied from CRM [id number]. Topic: Clinical - Medication Refill >> Aug 09, 2024 10:28 AM Ahlexyia S wrote: Medication: Oxycodone  HCl 10 MG TABS   Has the patient contacted their pharmacy? No, wife was told to contact clinic. (Agent: If no, request that the patient contact the pharmacy for the refill. If patient does not wish to contact the pharmacy document the reason why and proceed with request.) (Agent: If yes, when and what did the pharmacy advise?)  This is the patient's preferred pharmacy:  [id number] - [place], [place] - [address] AT [address] OF [facility] [address] [place] [address] Phone: [phone] Fax: [phone]  Is this the correct pharmacy for this prescription? Yes If no, delete pharmacy and type the correct one.   Has the prescription been filled recently? Yes, refilled in August  Is the patient out of the medication? Yes  Has the patient been seen for an appointment in the last year OR does the patient have an upcoming appointment? Yes  Can we respond through MyChart? Yes  Agent: Please be advised that Rx refills may take up to 3 business days. We ask that you follow-up with your pharmacy.

## 2024-08-10 ENCOUNTER — Encounter: Payer: Self-pay | Admitting: Gastroenterology

## 2024-08-13 MED ORDER — OXYCODONE HCL 10 MG PO TABS: 10.0000 mg | ORAL_TABLET | Freq: Two times a day (BID) | ORAL | 0 refills | Status: DC

## 2024-08-13 NOTE — Telephone Encounter (Signed)
 Copied from CRM [id number]. Topic: Clinical - Medication Refill >> Aug 13, 2024 12:17 PM Mia F wrote: Pt wife Richard Vaughn called to follow up on med request. Pt is now out of medication.

## 2024-08-15 ENCOUNTER — Inpatient Hospital Stay (HOSPITAL_BASED_OUTPATIENT_CLINIC_OR_DEPARTMENT_OTHER)
Admission: EM | Admit: 2024-08-15 | Discharge: 2024-08-18 | DRG: 064 | Disposition: A | Discharge status: Home Health | Source: Ambulatory Visit | Attending: Internal Medicine | Admitting: Internal Medicine

## 2024-08-15 ENCOUNTER — Other Ambulatory Visit: Payer: Self-pay

## 2024-08-15 ENCOUNTER — Encounter (HOSPITAL_BASED_OUTPATIENT_CLINIC_OR_DEPARTMENT_OTHER): Payer: Self-pay | Admitting: Emergency Medicine

## 2024-08-15 ENCOUNTER — Encounter: Payer: Self-pay | Admitting: Family Medicine

## 2024-08-15 ENCOUNTER — Emergency Department (HOSPITAL_BASED_OUTPATIENT_CLINIC_OR_DEPARTMENT_OTHER): Admitting: Radiology

## 2024-08-15 ENCOUNTER — Ambulatory Visit: Payer: Self-pay

## 2024-08-15 ENCOUNTER — Emergency Department (HOSPITAL_BASED_OUTPATIENT_CLINIC_OR_DEPARTMENT_OTHER)

## 2024-08-15 ENCOUNTER — Other Ambulatory Visit: Payer: Self-pay | Admitting: Family Medicine

## 2024-08-15 DIAGNOSIS — R471 Dysarthria and anarthria: Secondary | ICD-10-CM | POA: Diagnosis present

## 2024-08-15 DIAGNOSIS — N400 Enlarged prostate without lower urinary tract symptoms: Secondary | ICD-10-CM | POA: Diagnosis present

## 2024-08-15 DIAGNOSIS — Z8249 Family history of ischemic heart disease and other diseases of the circulatory system: Secondary | ICD-10-CM

## 2024-08-15 DIAGNOSIS — I10 Essential (primary) hypertension: Secondary | ICD-10-CM | POA: Diagnosis present

## 2024-08-15 DIAGNOSIS — G4733 Obstructive sleep apnea (adult) (pediatric): Secondary | ICD-10-CM | POA: Diagnosis present

## 2024-08-15 DIAGNOSIS — R4702 Dysphasia: Secondary | ICD-10-CM | POA: Diagnosis present

## 2024-08-15 DIAGNOSIS — Z823 Family history of stroke: Secondary | ICD-10-CM

## 2024-08-15 DIAGNOSIS — I2510 Atherosclerotic heart disease of native coronary artery without angina pectoris: Secondary | ICD-10-CM

## 2024-08-15 DIAGNOSIS — K3184 Gastroparesis: Secondary | ICD-10-CM | POA: Diagnosis present

## 2024-08-15 DIAGNOSIS — K2270 Barrett's esophagus without dysplasia: Secondary | ICD-10-CM | POA: Diagnosis present

## 2024-08-15 DIAGNOSIS — I6201 Nontraumatic acute subdural hemorrhage: Secondary | ICD-10-CM | POA: Diagnosis not present

## 2024-08-15 DIAGNOSIS — E66811 Obesity, class 1: Secondary | ICD-10-CM | POA: Diagnosis present

## 2024-08-15 DIAGNOSIS — G935 Compression of brain: Secondary | ICD-10-CM | POA: Diagnosis present

## 2024-08-15 DIAGNOSIS — S065XAA Traumatic subdural hemorrhage with loss of consciousness status unknown, initial encounter: Principal | ICD-10-CM | POA: Diagnosis present

## 2024-08-15 DIAGNOSIS — Z7982 Long term (current) use of aspirin: Secondary | ICD-10-CM

## 2024-08-15 DIAGNOSIS — Z87891 Personal history of nicotine dependence: Secondary | ICD-10-CM

## 2024-08-15 DIAGNOSIS — R32 Unspecified urinary incontinence: Secondary | ICD-10-CM | POA: Diagnosis present

## 2024-08-15 DIAGNOSIS — I739 Peripheral vascular disease, unspecified: Secondary | ICD-10-CM | POA: Diagnosis present

## 2024-08-15 DIAGNOSIS — E785 Hyperlipidemia, unspecified: Secondary | ICD-10-CM | POA: Diagnosis present

## 2024-08-15 DIAGNOSIS — R29706 NIHSS score 6: Secondary | ICD-10-CM | POA: Diagnosis present

## 2024-08-15 DIAGNOSIS — I69354 Hemiplegia and hemiparesis following cerebral infarction affecting left non-dominant side: Secondary | ICD-10-CM

## 2024-08-15 DIAGNOSIS — G4730 Sleep apnea, unspecified: Secondary | ICD-10-CM | POA: Diagnosis present

## 2024-08-15 DIAGNOSIS — I674 Hypertensive encephalopathy: Secondary | ICD-10-CM | POA: Diagnosis present

## 2024-08-15 DIAGNOSIS — G894 Chronic pain syndrome: Secondary | ICD-10-CM | POA: Diagnosis present

## 2024-08-15 DIAGNOSIS — F32A Depression, unspecified: Secondary | ICD-10-CM | POA: Diagnosis present

## 2024-08-15 DIAGNOSIS — Z5189 Encounter for other specified aftercare: Secondary | ICD-10-CM

## 2024-08-15 DIAGNOSIS — M549 Dorsalgia, unspecified: Secondary | ICD-10-CM | POA: Diagnosis present

## 2024-08-15 DIAGNOSIS — Z8616 Personal history of COVID-19: Secondary | ICD-10-CM

## 2024-08-15 DIAGNOSIS — Z800 Family history of malignant neoplasm of digestive organs: Secondary | ICD-10-CM

## 2024-08-15 DIAGNOSIS — Z79899 Other long term (current) drug therapy: Secondary | ICD-10-CM

## 2024-08-15 DIAGNOSIS — Z6830 Body mass index (BMI) 30.0-30.9, adult: Secondary | ICD-10-CM

## 2024-08-15 DIAGNOSIS — K219 Gastro-esophageal reflux disease without esophagitis: Secondary | ICD-10-CM | POA: Diagnosis present

## 2024-08-15 LAB — CBC WITH DIFFERENTIAL/PLATELET
Abs Immature Granulocytes: 0.01 K/uL (ref 0.00–0.07)
Basophils Absolute: 0 K/uL (ref 0.0–0.1)
Basophils Relative: 1 %
Eosinophils Absolute: 0.1 K/uL (ref 0.0–0.5)
Eosinophils Relative: 2 %
HCT: 42.1 % (ref 39.0–52.0)
Hemoglobin: 14.2 g/dL (ref 13.0–17.0)
Immature Granulocytes: 0 %
Lymphocytes Relative: 31 %
Lymphs Abs: 1.9 K/uL (ref 0.7–4.0)
MCH: 30.4 pg (ref 26.0–34.0)
MCHC: 33.7 g/dL (ref 30.0–36.0)
MCV: 90.1 fL (ref 80.0–100.0)
Monocytes Absolute: 0.7 K/uL (ref 0.1–1.0)
Monocytes Relative: 11 %
Neutro Abs: 3.3 K/uL (ref 1.7–7.7)
Neutrophils Relative %: 55 %
Platelets: 249 K/uL (ref 150–400)
RBC: 4.67 MIL/uL (ref 4.22–5.81)
RDW: 12.7 % (ref 11.5–15.5)
WBC: 6 K/uL (ref 4.0–10.5)
nRBC: 0 % (ref 0.0–0.2)

## 2024-08-15 LAB — COMPREHENSIVE METABOLIC PANEL WITH GFR
ALT: 21 U/L (ref 0–44)
AST: 19 U/L (ref 15–41)
Albumin: 3.8 g/dL (ref 3.5–5.0)
Alkaline Phosphatase: 210 U/L — ABNORMAL HIGH (ref 38–126)
Anion gap: 11 (ref 5–15)
BUN: 10 mg/dL (ref 8–23)
CO2: 23 mmol/L (ref 22–32)
Calcium: 9.3 mg/dL (ref 8.9–10.3)
Chloride: 103 mmol/L (ref 98–111)
Creatinine, Ser: 0.98 mg/dL (ref 0.61–1.24)
GFR, Estimated: >60 mL/min (ref >60)
Glucose, Bld: 100 mg/dL — ABNORMAL HIGH (ref 70–99)
Potassium: 4.2 mmol/L (ref 3.5–5.1)
Sodium: 137 mmol/L (ref 135–145)
Total Bilirubin: 0.3 mg/dL (ref 0.0–1.2)
Total Protein: 7 g/dL (ref 6.5–8.1)

## 2024-08-15 LAB — TSH: TSH: 0.669 u[IU]/mL (ref 0.350–4.500)

## 2024-08-15 NOTE — ED Notes (Signed)
 Pt states he is incontinent but still has the feeling when he need to go. Not able to provide a urine sample at this time.

## 2024-08-15 NOTE — Consult Note (Signed)
 Providing Compassionate, Quality Care - Together   Reason for Consult:Subdural hematoma Referring Physician: Dr. Mannie Gwenn JONETTA Richard Vaughn is an 73 y.o. male.  HPI: [name] is a 73 year old male with a past medical history outlined below. His wife is at the bedside and assists with the history. Richard Vaughn has a history of 3 strokes that have left him with some dysarthria and mild left-sided weakness. He presented to the [facility] emergency department today due to concern for possible new stroke. His wife reports over the last few weeks, the patient has had episodes of tachycardia, diaphoresis, and fatigue. More recently, the patient has had episodes of bowel and bladder incontinence. He has not had an issue with this before. The patient has had a couple of falls, though Mr. Hands and his wife report he did not hit his head. He takes a baby aspirin  daily, but is not on an anticoagulant. Imaging in the emergency department demonstrates chronic left-sided subdural hematoma with a small acute component and 2 mm rightward midline shift. Neurosurgery was consulted for further evaluation and recommendations.  Past Medical History:  Diagnosis Date   Barrett's esophagus    Cerebrovascular accident (CVA) due to thrombosis of left carotid artery (HCC) 09/23/2015   Chronic back pain    CVA (cerebral vascular accident) (HCC) 09/22/2016   Depression    Gastritis    Mild   Gastroparesis    GERD (gastroesophageal reflux disease)    Headache    History of kidney stones    Hyperglycemia    Hypertension    ICH (intracerebral hemorrhage) (HCC) - R thalmic/PLIC d/t HTN 93/98/7980   Lacunar infarct, acute (HCC) 08/25/2015   Nephrolithiasis    Pneumonia    Covid pneumonia   Renal cyst    Sleep apnea    Stricture and stenosis of esophagus    Stroke (HCC) 07/2015   Stroke-like symptom 09/22/2016   Treatment-emergent central sleep apnea 10/25/2018   Vision abnormalities     Past Surgical History:   Procedure Laterality Date   CERVICAL DISC SURGERY  2012   COLONOSCOPY     LUMBAR DISC SURGERY  2005, 2010   replaced L4 and L5   SHOULDER ARTHROSCOPY WITH ROTATOR CUFF REPAIR Left 08/14/2020   Procedure: SHOULDER ARTHROSCOPY WITH ROTATOR CUFF REPAIR, SUBACROMIAL DECOMPRESSION;  Surgeon: Dozier Soulier, MD;  Location: [facility];  Service: Orthopedics;  Laterality: Left;   UPPER GASTROINTESTINAL ENDOSCOPY      Family History  Problem Relation Age of Onset   Hypertension Father    Stroke Father    Hypertension Mother    Liver cancer Mother    Hypertension Sister        2 other sisters has also   Stroke Sister    Stroke Sister    Hypertension Brother        2nd brother has also   Colon cancer Neg Hx    Esophageal cancer Neg Hx    Rectal cancer Neg Hx    Stomach cancer Neg Hx    Colon polyps Neg Hx     Social History:  reports that he quit smoking about 6 years ago. His smoking use included cigarettes. He started smoking about 36 years ago. He has a 15 pack-year smoking history. He has never used smokeless tobacco. He reports that he does not drink alcohol and does not use drugs.  Allergies: No Known Allergies  Medications: I have reviewed the patient's current medications.  Results for orders  placed or performed during the hospital encounter of 08/15/24 (from the past 48 hours)  Comprehensive metabolic panel     Status: Abnormal   Collection Time: 08/15/24  4:20 PM  Result Value Ref Range   Sodium 137 135 - 145 mmol/L   Potassium 4.2 3.5 - 5.1 mmol/L   Chloride 103 98 - 111 mmol/L   CO2 23 22 - 32 mmol/L   Glucose, Bld 100 (H) 70 - 99 mg/dL    Comment: Glucose reference range applies only to samples taken after fasting for at least 8 hours.   BUN 10 8 - 23 mg/dL   Creatinine, Ser 9.01 0.61 - 1.24 mg/dL   Calcium  9.3 8.9 - 10.3 mg/dL   Total Protein 7.0 6.5 - 8.1 g/dL   Albumin 3.8 3.5 - 5.0 g/dL   AST 19 15 - 41 U/L   ALT 21 0 - 44 U/L   Alkaline Phosphatase 210 (H) 38  - 126 U/L   Total Bilirubin 0.3 0.0 - 1.2 mg/dL   GFR, Estimated >39 >39 mL/min    Comment: (NOTE) Calculated using the CKD-EPI Creatinine Equation (2021)    Anion gap 11 5 - 15    Comment: Performed at [facility], [address], [place], [place] [address]  CBC with Differential     Status: None   Collection Time: 08/15/24  4:20 PM  Result Value Ref Range   WBC 6.0 4.0 - 10.5 K/uL   RBC 4.67 4.22 - 5.81 MIL/uL   Hemoglobin 14.2 13.0 - 17.0 g/dL   HCT 57.8 60.9 - 47.9 %   MCV 90.1 80.0 - 100.0 fL   MCH 30.4 26.0 - 34.0 pg   MCHC 33.7 30.0 - 36.0 g/dL   RDW 87.2 88.4 - 84.4 %   Platelets 249 150 - 400 K/uL   nRBC 0.0 0.0 - 0.2 %   Neutrophils Relative % 55 %   Neutro Abs 3.3 1.7 - 7.7 K/uL   Lymphocytes Relative 31 %   Lymphs Abs 1.9 0.7 - 4.0 K/uL   Monocytes Relative 11 %   Monocytes Absolute 0.7 0.1 - 1.0 K/uL   Eosinophils Relative 2 %   Eosinophils Absolute 0.1 0.0 - 0.5 K/uL   Basophils Relative 1 %   Basophils Absolute 0.0 0.0 - 0.1 K/uL   Immature Granulocytes 0 %   Abs Immature Granulocytes 0.01 0.00 - 0.07 K/uL    Comment: Performed at [facility], [address] [place], [place] [address]  TSH     Status: None   Collection Time: 08/15/24  4:20 PM  Result Value Ref Range   TSH 0.669 0.350 - 4.500 uIU/mL    Comment: Performed at [facility], [address], [place], [place] [address]    DG Chest 1 View Result Date: 08/15/2024 CLINICAL DATA:  cough EXAM: CHEST  1 VIEW COMPARISON:  Chest x-ray 05/04/2024 CT angio chest 05/04/2024 FINDINGS: The heart and mediastinal contours are unchanged. Atherosclerotic plaque. No focal consolidation. Chronic coarsened interstitial markings with no overt pulmonary edema. No pleural effusion. No pneumothorax. No acute osseous abnormality. IMPRESSION: 1. No active disease. 2. Aortic Atherosclerosis (ICD10-I70.0) and Emphysema (ICD10-J43.9). Electronically Signed   By:  Morgane  Naveau M.D.   On: 08/15/2024 17:06   CT Head Wo Contrast Result Date: 08/15/2024 CLINICAL DATA:  Provided history: Stroke, follow-up. EXAM: CT HEAD WITHOUT CONTRAST TECHNIQUE: Contiguous axial images were obtained from the base of the skull through the vertex without intravenous  contrast. RADIATION DOSE REDUCTION: This exam was performed according to the departmental dose-optimization program which includes automated exposure control, adjustment of the mA and/or kV according to patient size and/or use of iterative reconstruction technique. COMPARISON:  Brain MRI 06/09/2023.  Head CT 06/08/2023. FINDINGS: Brain: Mild generalized cerebral atrophy. Subdural hematoma overlying the left cerebral hemisphere measuring up to 9 mm in thickness (for instance as seen on series 9, image 24). The hematoma is predominantly hypodense. However, there are small-volume hyperdense components of acute hemorrhage within the collection. Mass effect upon the underlying left cerebral hemisphere with 2 mm rightward midline shift. Chronic lacunar infarcts within the left cerebral hemispheric white matter and within/about the bilateral deep gray nuclei, not appreciably changed from the prior MRI of 06/09/2023. Background advanced patchy and confluent hypoattenuation within the cerebral white matter, nonspecific but compatible with chronic small vessel ischemic disease. No acute demarcated cortical infarct. No evidence of an intracranial mass. Vascular: No hyperdense vessel.  Atherosclerotic calcifications. Skull: No calvarial fracture or aggressive osseous lesion. Sinuses/Orbits: No mass or acute finding within the imaged orbits. No significant paranasal sinus disease. Other: Small left mastoid effusion. Impression #1 called by telephone at the time of interpretation on 08/15/2024 at 5:03 pm to provider [facility] , who verbally acknowledged these results. IMPRESSION: 1. Mixed density subdural hematoma overlying the left  cerebral hemisphere measuring up to 9 mm in thickness (containing small-volume acute components). Mass effect upon the underlying left cerebral hemisphere with 2 mm rightward midline shift. 2. Background parenchymal atrophy, chronic small vessel ischemic disease and unchanged chronic lacunar infarcts, as described. Electronically Signed   By: Rockey Childs D.O.   On: 08/15/2024 17:05    Review of Systems  Constitutional:  Positive for diaphoresis.  HENT: Negative.    Eyes: Negative.   Respiratory: Negative.    Cardiovascular: Negative.   Gastrointestinal: Negative.   Genitourinary:        Incontinence  Musculoskeletal:  Positive for back pain and falls.  Psychiatric/Behavioral: Negative.     Blood pressure 103/83, pulse 67, temperature (!) 97.5 F (36.4 C), temperature source Oral, resp. rate (!) 21, height 6' 3 (1.905 m), weight 111.7 kg, SpO2 99%. Estimated body mass index is 30.78 kg/m as calculated from the following:   Height as of this encounter: 6' 3 (1.905 m).   Weight as of this encounter: 111.7 kg.  Physical Exam HENT:     Head: Normocephalic and atraumatic.     Nose: Nose normal.     Mouth/Throat:     Mouth: Mucous membranes are moist.     Pharynx: Oropharynx is clear.  Cardiovascular:     Rate and Rhythm: Normal rate and regular rhythm.  Pulmonary:     Effort: Pulmonary effort is normal.  Abdominal:     General: Abdomen is flat.     Palpations: Abdomen is soft.  Musculoskeletal:        General: Normal range of motion.     Cervical back: Normal range of motion and neck supple.  Skin:    General: Skin is warm and dry.     Capillary Refill: Capillary refill takes less than 2 seconds.  Neurological:     Mental Status: He is alert.     GCS: GCS eye subscore is 4. GCS verbal subscore is 4. GCS motor subscore is 6.     Motor: Motor function is intact.     Coordination: Coordination is intact.     Comments: Oriented to person, place,  and month, but not year. Mild  left-sided weakness.  Psychiatric:        Attention and Perception: Attention normal.        Mood and Affect: Affect is tearful.        Speech: Speech is slurred.        Behavior: Behavior normal. Behavior is cooperative.     Assessment/Plan: Mr. Cullers has an acute on chronic subdural hematoma over the left cerebral hemisphere. I discussed the surgical treatment option of left-sided burr holes for evacuation of his subdural hematoma with the patient and his wife. The procedure was explained as were the possible risks, including but not limited to, the risks of anesthesia, stroke, worsening symptoms, recurrence of subdural hematoma, etc. The patient and his wife have voiced understanding and wish to move forward with the procedure. The patient should be made NPO at midnight in anticipation of surgery tomorrow afternoon.  I am in communication with my attending and they agree with the plan for this patient.   Gerard Beck, DNP, AGNP-C Nurse Practitioner  [facility] [address], [place], [place] [address] P: [phone]    F: [phone]  08/15/2024, 6:23 PM

## 2024-08-15 NOTE — Telephone Encounter (Signed)
 FYI Only or Action Required?: FYI only for provider.  Patient was last seen in primary care on 08/07/2024 by [place], [name], MD.  Called Nurse Triage reporting Neurologic Problem.  Symptoms began 08/08/2024.  Interventions attempted: Rest, hydration, or home remedies.  Symptoms are: gradually worsening.  Triage Disposition: Go to ED Now (Notify PCP)  Patient/caregiver understands and will follow disposition?: Yes              Copied from CRM [id number]. Topic: Clinical - Red Word Triage >> Aug 15, 2024 10:49 AM Richard Vaughn wrote: [name] that prompted transfer to Nurse Triage: tachycardia, involuntary movement, blood pressure questions Reason for Disposition  [1] Weakness (i.e., paralysis, loss of muscle strength) of the face, arm / hand, or leg / foot on one side of the body AND [2] sudden onset AND [3] brief (now gone)  Answer Assessment - Initial Assessment Questions Patient at his baseline right now sitting in a chair A couple of weeks ago---patient walking with his wife, pt started to whisper and couldn't speak any louder He has had a couple of falls---07/27/2024 & went to Urgent Care on 07/28/2024 (compression fracture of L2 found and then [facility] and had [facility]). [place] again on 07/28/2024---he lost his balance and sat down These episodes started a few days after seen at his last appointment on 08/07/2024 08/08/2024 is when wife noticed random sweating & pulse went up to 124 and left leg seemed to rotate outward---he gets disoriented during these episodes 08/11/2024 patient rolled out of bed and  Episodes: He has been diaphoretic a few times while just sitting still & this is abnormal for him Blood pressure was up, started normalizing, and then tachycardia was happening She states that his left foot seems to posture to the left and he starts walking to the left and can't walk straight Patient is alert and oriented when this happens with no facial  drooping  Last night--patient kept saying yes yes and started sweating and seem to posture to the left and his heart rate went up--wife put him in bed and his heart rate came down Heart rate 74 right now & patient denies any complaints at this time.  Patient's wife states she called a neurology office as well and was calling patient's PCP because symptoms were not present now.  This RN explained that even though they are not currently present, but have been happening often and more frequently, with the last episode being last night, and with the fact he rolled out of bed (unwitnessed) it is recommended that he goes to the Emergency Room. Wife is advised that he needs to be evaluated as soon as possible and she states she was thinking about Urgent Care but this RN informed her that the Emergency Room would be the recommendation due to having imaging capabilities. Patient's wife agreed to take him to the Emergency Room at this time. She is advised that if anything worsens she can call 911.      3. LAST NORMAL: When was the last time you (the patient) were normal (no symptoms)?     Patient currently at baseline per wife   4. PATTERN Does this come and go, or has it been constant since it started?  Is it present now?     Comes and goes ---not present 5. CARDIAC SYMPTOMS: Have you had any of the following symptoms: chest pain, difficulty breathing, palpitations?     Heart rate went up during  episodes 6. NEUROLOGIC SYMPTOMS: Have you had any of the following symptoms: headache, dizziness, vision loss, double vision, changes in speech, unsteady on your feet?     Patient denies at this time  Protocols used: Neurologic Deficit-A-AH

## 2024-08-15 NOTE — Telephone Encounter (Signed)
 Neuro referral has been placed. If any neurologic like new symptom presents, he needs to be evaluated in the ER immediately. Continue monitoring BP regularly, some of BPs are low, which could be contributing to symptoms. If another BP < 100/60, stop Amlodipine . Thanks, BJ

## 2024-08-15 NOTE — ED Provider Notes (Signed)
[place] EMERGENCY DEPARTMENT AT [facility] Provider Note  CSN: [id number] Arrival date & time: 08/15/24 1455  Chief Complaint(s) Tachycardia and Diaphoresis  HPI Richard Vaughn is a 72 y.o. male who is here today for a few weeks of occasional increased heart rate with activity, diaphoresis with and without exertion, increased fatigue, she has also noticed that the patient seems to have episodes where he postures with his left side where his left hand and arm will flex outside.  He has also been having issues with bowel and bladder incontinence which is not the norm for him.  He has had a few falls over the last 1 month.  History of multiple prior strokes.   Past Medical History Past Medical History:  Diagnosis Date   Barrett's esophagus    Cerebrovascular accident (CVA) due to thrombosis of left carotid artery (HCC) 09/23/2015   Chronic back pain    CVA (cerebral vascular accident) (HCC) 09/22/2016   Depression    Gastritis    Mild   Gastroparesis    GERD (gastroesophageal reflux disease)    Headache    History of kidney stones    Hyperglycemia    Hypertension    ICH (intracerebral hemorrhage) (HCC) - R thalmic/PLIC d/t HTN 93/98/7980   Lacunar infarct, acute (HCC) 08/25/2015   Nephrolithiasis    Pneumonia    Covid pneumonia   Renal cyst    Sleep apnea    Stricture and stenosis of esophagus    Stroke (HCC) 07/2015   Stroke-like symptom 09/22/2016   Treatment-emergent central sleep apnea 10/25/2018   Vision abnormalities    Patient Active Problem List   Diagnosis Date Noted   Ascending aorta dilation 05/09/2024   Mucus plugging of bronchi 05/05/2024   Obesity, Class I, BMI 30-34.9 05/05/2024   Acute respiratory failure with hypoxia (HCC) 05/04/2024   Dystonia of extremity 11/17/2023   Adhesive capsulitis of left shoulder 11/17/2023   Marijuana use 06/09/2023   History of intracranial hemorrhage 06/09/2023   Drug-induced constipation 06/08/2022    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) 12/09/2021   Depression, major, recurrent, mild    Sleep disturbance    Essential hypertension    Left thyroid  nodule    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) 10/25/2018   Chronic pain syndrome    Chronic bilateral low back pain    Dysphagia, post-stroke    Insomnia 07/13/2017   PAD (peripheral artery disease) 07/13/2017   Hyperlipidemia with target LDL less than 70 07/13/2017   Paresthesia 09/22/2016   Primary snoring 09/23/2015   Obstructive sleep apnea 09/23/2015   Sleep apnea 08/25/2015   Dysarthria    CVD (cardiovascular disease) 08/02/2015   Systolic hypertension with cerebrovascular disease 12/27/2014   NEPHROLITHIASIS 05/23/2009   BARRETTS ESOPHAGUS 02/20/2009   Gastroparesis 02/20/2009   RADICULOPATHY 10/21/2008   GERD 10/08/2008   ESOPHAGEAL STRICTURE 08/15/2008   RENAL CYST 08/14/2008   PULMONARY NODULE 01/10/2008   Home Medication(s) Prior to Admission medications   Medication Sig Start Date End Date Taking? Authorizing Provider  methocarbamol (ROBAXIN) 500 MG tablet Take 500 mg by mouth 3 (three) times daily as needed. 07/28/24  Yes [provider]  tamsulosin  (FLOMAX ) 0.4 MG CAPS capsule Take 0.4 mg by mouth daily. 06/28/24  Yes [provider]  amLODipine  (NORVASC ) 2.5 MG tablet TAKE 1 TABLET BY MOUTH AT BEDTIME. 03/28/24   [place], [name], MD  aspirin  EC 81 MG tablet Take 1  tablet (81 mg total) by mouth daily. Swallow whole. 06/10/20   Whitfield [name], NP  docusate sodium  (COLACE) 100 MG capsule Take 100 mg by mouth daily.    [provider]  DULoxetine  (CYMBALTA ) 60 MG capsule TAKE 1 CAPSULE BY MOUTH EVERY DAY 01/02/24   [place], [name], MD  esomeprazole  (NEXIUM ) 40 MG capsule TAKE 1 CAPSULE (40 MG TOTAL) BY MOUTH 2 (TWO) TIMES DAILY BEFORE A MEAL. 08/06/24   [name], PA-C  lovastatin  (MEVACOR ) 20 MG tablet TAKE 1 TABLET BY  MOUTH EVERYDAY AT BEDTIME 03/28/24   [place], [name], MD  Melatonin 10 MG TABS Take 1 tablet by mouth at bedtime.    [provider]  olmesartan  (BENICAR ) 20 MG tablet TAKE 1 TABLET BY MOUTH EVERY DAY 01/02/24   [place], [name], MD  Oxycodone  HCl 10 MG TABS Take 1 tablet (10 mg total) by mouth 2 (two) times daily. 08/13/24   [place], [name], MD  traZODone  (DESYREL ) 50 MG tablet TAKE 1 TABLET BY MOUTH EVERYDAY AT BEDTIME 03/28/24   [place], [name], MD                                                                                                                                    Past Surgical History Past Surgical History:  Procedure Laterality Date   CERVICAL DISC SURGERY  2012   COLONOSCOPY     LUMBAR DISC SURGERY  2005, 2010   replaced L4 and L5   SHOULDER ARTHROSCOPY WITH ROTATOR CUFF REPAIR Left 08/14/2020   Procedure: SHOULDER ARTHROSCOPY WITH ROTATOR CUFF REPAIR, SUBACROMIAL DECOMPRESSION;  Surgeon: Dozier Soulier, MD;  Location: [facility];  Service: Orthopedics;  Laterality: Left;   UPPER GASTROINTESTINAL ENDOSCOPY     Family History Family History  Problem Relation Age of Onset   Hypertension Father    Stroke Father    Hypertension Mother    Liver cancer Mother    Hypertension Sister        2 other sisters has also   Stroke Sister    Stroke Sister    Hypertension Brother        2nd brother has also   Colon cancer Neg Hx    Esophageal cancer Neg Hx    Rectal cancer Neg Hx    Stomach cancer Neg Hx    Colon polyps Neg Hx     Social History Social History   Tobacco Use   Smoking status: Former    Current packs/day: 0.00    Average packs/day: 0.5 packs/day for 30.0 years (15.0 ttl pk-yrs)    Types: Cigarettes    Start date: 04/15/1988    Quit date: 04/15/2018    Years since quitting: 6.3   Smokeless tobacco: Never  Vaping Use   Vaping status: Never Used  Substance Use Topics   Alcohol use: No    Alcohol/week: 0.0 standard drinks of alcohol  Drug use: Never     Comment: last use 04/2018   Allergies Patient has no known allergies.  Review of Systems Review of Systems  Physical Exam Vital Signs  I have reviewed the triage vital signs BP 103/83   Pulse 67   Temp (!) 97.5 F (36.4 C) (Oral)   Resp (!) 21   Ht 6' 3 (1.905 m)   Wt 111.7 kg   SpO2 99%   BMI 30.78 kg/m   Physical Exam Vitals and nursing note reviewed.  HENT:     Head: Normocephalic and atraumatic.  Eyes:     Pupils: Pupils are equal, round, and reactive to light.  Cardiovascular:     Rate and Rhythm: Normal rate.     Pulses: Normal pulses.  Pulmonary:     Effort: Pulmonary effort is normal.  Abdominal:     General: Abdomen is flat.     Palpations: Abdomen is soft.  Musculoskeletal:        General: Normal range of motion.     Cervical back: Normal range of motion.  Neurological:     Mental Status: He is alert. Mental status is at baseline.     ED Results and Treatments Labs (all labs ordered are listed, but only abnormal results are displayed) Labs Reviewed  COMPREHENSIVE METABOLIC PANEL WITH GFR - Abnormal; Notable for the following components:      Result Value   Glucose, Bld 100 (*)    Alkaline Phosphatase 210 (*)    All other components within normal limits  CBC WITH DIFFERENTIAL/PLATELET  TSH  URINALYSIS, W/ REFLEX TO CULTURE (INFECTION SUSPECTED)                                                                                                                          Radiology DG Chest 1 View Result Date: 08/15/2024 CLINICAL DATA:  cough EXAM: CHEST  1 VIEW COMPARISON:  Chest x-ray 05/04/2024 CT angio chest 05/04/2024 FINDINGS: The heart and mediastinal contours are unchanged. Atherosclerotic plaque. No focal consolidation. Chronic coarsened interstitial markings with no overt pulmonary edema. No pleural effusion. No pneumothorax. No acute osseous abnormality. IMPRESSION: 1. No active disease. 2. Aortic Atherosclerosis (ICD10-I70.0) and Emphysema  (ICD10-J43.9). Electronically Signed   By: Morgane  Naveau M.D.   On: 08/15/2024 17:06   CT Head Wo Contrast Result Date: 08/15/2024 CLINICAL DATA:  Provided history: Stroke, follow-up. EXAM: CT HEAD WITHOUT CONTRAST TECHNIQUE: Contiguous axial images were obtained from the base of the skull through the vertex without intravenous contrast. RADIATION DOSE REDUCTION: This exam was performed according to the departmental dose-optimization program which includes automated exposure control, adjustment of the mA and/or kV according to patient size and/or use of iterative reconstruction technique. COMPARISON:  Brain MRI 06/09/2023.  Head CT 06/08/2023. FINDINGS: Brain: Mild generalized cerebral atrophy. Subdural hematoma overlying the left cerebral hemisphere measuring up to 9 mm in thickness (for instance as seen on series 9, image 24). The hematoma is predominantly hypodense.  However, there are small-volume hyperdense components of acute hemorrhage within the collection. Mass effect upon the underlying left cerebral hemisphere with 2 mm rightward midline shift. Chronic lacunar infarcts within the left cerebral hemispheric white matter and within/about the bilateral deep gray nuclei, not appreciably changed from the prior MRI of 06/09/2023. Background advanced patchy and confluent hypoattenuation within the cerebral white matter, nonspecific but compatible with chronic small vessel ischemic disease. No acute demarcated cortical infarct. No evidence of an intracranial mass. Vascular: No hyperdense vessel.  Atherosclerotic calcifications. Skull: No calvarial fracture or aggressive osseous lesion. Sinuses/Orbits: No mass or acute finding within the imaged orbits. No significant paranasal sinus disease. Other: Small left mastoid effusion. Impression #1 called by telephone at the time of interpretation on 08/15/2024 at 5:03 pm to provider [facility] , who verbally acknowledged these results. IMPRESSION: 1. Mixed density  subdural hematoma overlying the left cerebral hemisphere measuring up to 9 mm in thickness (containing small-volume acute components). Mass effect upon the underlying left cerebral hemisphere with 2 mm rightward midline shift. 2. Background parenchymal atrophy, chronic small vessel ischemic disease and unchanged chronic lacunar infarcts, as described. Electronically Signed   By: Rockey Childs D.O.   On: 08/15/2024 17:05    Pertinent labs & imaging results that were available during my care of the patient were reviewed by me and considered in my medical decision making (see MDM for details).  Medications Ordered in ED Medications - No data to display                                                                                                                                   Procedures .Critical Care  Performed by: Mannie Fairy DASEN, DO Authorized by: Mannie Fairy DASEN, DO   Critical care provider statement:    Critical care time (minutes):  44   Critical care was necessary to treat or prevent imminent or life-threatening deterioration of the following conditions:  CNS failure or compromise   Critical care was time spent personally by me on the following activities:  Development of treatment plan with patient or surrogate, discussions with consultants, evaluation of patient's response to treatment, examination of patient, ordering and review of laboratory studies, ordering and review of radiographic studies, ordering and performing treatments and interventions, pulse oximetry, re-evaluation of patient's condition and review of old charts   (including critical care time)  Medical Decision Making / ED Course   This patient presents to the ED for concern of multiple complaints, this involves an extensive number of treatment options, and is a complaint that carries with it a high risk of complications and morbidity.  The differential diagnosis includes for light abnormalities, seizure disorder,  pneumonia, UTI, recurrent CVA, stroke progression, autonomic dysfunction.  MDM: Had a lengthy discussion with the patient patient's wife at bedside.  Sounds of the symptoms have been ongoing for the last few weeks.  Will obtain some imaging  of the head, blood work.  Did discuss possibility of focal seizures occurring during these episodes.  Could certainly be some degree of autonomic dysfunction.  Explained to the patient the patient's wife the limits of what her workup would be able to be here in the emergency room.  I did discuss the possibility of admission if they were continue to have symptoms and wanted a further or sooner workup.  They expressed a strong preference for going home if able.  On exam, patient overall appears to be at his baseline.  He is appropriately interactive.  I do not observe any new neurological deficit deficits from what is described as his baseline.  He is nontoxic-appearing.  Reassessment 5:30 PM-patient unfortunately with a mixed acute and chronic subdural hematoma with a small amount of shift.  Discussed his findings with the radiologist.  Reach out to neurosurgery, spoke with the PA Megan, recommended transfer to [facility] for bur hole procedure tomorrow morning.  I discussed this with the patient patient's wife at bedside.  They were agreeable with this plan.  Will admit patient to the hospitalist service.  Additional history obtained: -Additional history obtained from wife at bedside -External records from outside source obtained and reviewed including: Chart review including previous notes, labs, imaging, consultation notes   Lab Tests: -I ordered, reviewed, and interpreted labs.   The pertinent results include:   Labs Reviewed  COMPREHENSIVE METABOLIC PANEL WITH GFR - Abnormal; Notable for the following components:      Result Value   Glucose, Bld 100 (*)    Alkaline Phosphatase 210 (*)    All other components within normal limits  CBC WITH  DIFFERENTIAL/PLATELET  TSH  URINALYSIS, W/ REFLEX TO CULTURE (INFECTION SUSPECTED)       Imaging Studies ordered: I ordered imaging studies including CT head I independently visualized and interpreted imaging. I agree with the radiologist interpretation   Medicines ordered and prescription drug management: No orders of the defined types were placed in this encounter.   -I have reviewed the patients home medicines and have made adjustments as needed   Cardiac Monitoring: The patient was maintained on a cardiac monitor.  I personally viewed and interpreted the cardiac monitored which showed an underlying rhythm of: Normal sinus rhythm   Reevaluation: After the interventions noted above, I reevaluated the patient and found that they have :stayed the same  Co morbidities that complicate the patient evaluation  Past Medical History:  Diagnosis Date   Barrett's esophagus    Cerebrovascular accident (CVA) due to thrombosis of left carotid artery (HCC) 09/23/2015   Chronic back pain    CVA (cerebral vascular accident) (HCC) 09/22/2016   Depression    Gastritis    Mild   Gastroparesis    GERD (gastroesophageal reflux disease)    Headache    History of kidney stones    Hyperglycemia    Hypertension    ICH (intracerebral hemorrhage) (HCC) - R thalmic/PLIC d/t HTN 93/98/7980   Lacunar infarct, acute (HCC) 08/25/2015   Nephrolithiasis    Pneumonia    Covid pneumonia   Renal cyst    Sleep apnea    Stricture and stenosis of esophagus    Stroke (HCC) 07/2015   Stroke-like symptom 09/22/2016   Treatment-emergent central sleep apnea 10/25/2018   Vision abnormalities       Final Clinical Impression(s) / ED Diagnoses Final diagnoses:  Subdural hematoma (HCC)     @PCDICTATION @    Mannie Pac T, DO 08/15/24  1737  

## 2024-08-15 NOTE — ED Notes (Signed)
 Left sided weakness and gargled speech at baseline due to previous strokes.

## 2024-08-15 NOTE — Telephone Encounter (Signed)
 Spoke with patient's wife. Per patient's wife, they were on the way to Urgent Care to be evaluated. Advised wife Urgent Care or ED would be able to evaluate the patient.

## 2024-08-15 NOTE — ED Triage Notes (Addendum)
 Pt via pov from home with wife, who reports that he has had neurological issues for the last couple of weeks. She reports that he has had increased HR with activity, diaphoresis with and without exertion; increased fatigue. Pt has had 3 strokes, last one in 2021. Pt's wife reports that he has also had increased problems moving his left leg. Pt a&o x 3 (missed the year). Pt has garbled speech at baseline. NAD noted.   Wife reports that she thinks he has in increased facial droop on left side, which she noticed on the way here.

## 2024-08-16 ENCOUNTER — Encounter (HOSPITAL_COMMUNITY): Payer: Self-pay | Admitting: Internal Medicine

## 2024-08-16 ENCOUNTER — Inpatient Hospital Stay (HOSPITAL_COMMUNITY): Payer: Self-pay

## 2024-08-16 ENCOUNTER — Encounter (HOSPITAL_COMMUNITY)
Admission: EM | Disposition: A | Discharge status: Home / Self Care | Payer: Self-pay | Source: Ambulatory Visit | Attending: Internal Medicine

## 2024-08-16 DIAGNOSIS — K2270 Barrett's esophagus without dysplasia: Secondary | ICD-10-CM | POA: Diagnosis present

## 2024-08-16 DIAGNOSIS — E785 Hyperlipidemia, unspecified: Secondary | ICD-10-CM | POA: Diagnosis present

## 2024-08-16 DIAGNOSIS — Z823 Family history of stroke: Secondary | ICD-10-CM | POA: Diagnosis not present

## 2024-08-16 DIAGNOSIS — R569 Unspecified convulsions: Secondary | ICD-10-CM | POA: Diagnosis not present

## 2024-08-16 DIAGNOSIS — K219 Gastro-esophageal reflux disease without esophagitis: Secondary | ICD-10-CM | POA: Diagnosis present

## 2024-08-16 DIAGNOSIS — Z8616 Personal history of COVID-19: Secondary | ICD-10-CM | POA: Diagnosis not present

## 2024-08-16 DIAGNOSIS — G935 Compression of brain: Secondary | ICD-10-CM | POA: Diagnosis present

## 2024-08-16 DIAGNOSIS — K3184 Gastroparesis: Secondary | ICD-10-CM | POA: Diagnosis present

## 2024-08-16 DIAGNOSIS — Z79899 Other long term (current) drug therapy: Secondary | ICD-10-CM | POA: Diagnosis not present

## 2024-08-16 DIAGNOSIS — M549 Dorsalgia, unspecified: Secondary | ICD-10-CM | POA: Diagnosis present

## 2024-08-16 DIAGNOSIS — G4733 Obstructive sleep apnea (adult) (pediatric): Secondary | ICD-10-CM | POA: Diagnosis present

## 2024-08-16 DIAGNOSIS — Z87891 Personal history of nicotine dependence: Secondary | ICD-10-CM | POA: Diagnosis not present

## 2024-08-16 DIAGNOSIS — R4182 Altered mental status, unspecified: Secondary | ICD-10-CM | POA: Diagnosis not present

## 2024-08-16 DIAGNOSIS — E66811 Obesity, class 1: Secondary | ICD-10-CM | POA: Diagnosis present

## 2024-08-16 DIAGNOSIS — I10 Essential (primary) hypertension: Secondary | ICD-10-CM | POA: Diagnosis present

## 2024-08-16 DIAGNOSIS — N400 Enlarged prostate without lower urinary tract symptoms: Secondary | ICD-10-CM | POA: Diagnosis present

## 2024-08-16 DIAGNOSIS — G894 Chronic pain syndrome: Secondary | ICD-10-CM | POA: Diagnosis present

## 2024-08-16 DIAGNOSIS — S065XAA Traumatic subdural hemorrhage with loss of consciousness status unknown, initial encounter: Secondary | ICD-10-CM | POA: Diagnosis not present

## 2024-08-16 DIAGNOSIS — Z6830 Body mass index (BMI) 30.0-30.9, adult: Secondary | ICD-10-CM | POA: Diagnosis not present

## 2024-08-16 DIAGNOSIS — R29706 NIHSS score 6: Secondary | ICD-10-CM | POA: Diagnosis present

## 2024-08-16 DIAGNOSIS — R32 Unspecified urinary incontinence: Secondary | ICD-10-CM | POA: Diagnosis present

## 2024-08-16 DIAGNOSIS — I69354 Hemiplegia and hemiparesis following cerebral infarction affecting left non-dominant side: Secondary | ICD-10-CM | POA: Diagnosis not present

## 2024-08-16 DIAGNOSIS — F32A Depression, unspecified: Secondary | ICD-10-CM | POA: Diagnosis present

## 2024-08-16 DIAGNOSIS — Z8249 Family history of ischemic heart disease and other diseases of the circulatory system: Secondary | ICD-10-CM | POA: Diagnosis not present

## 2024-08-16 DIAGNOSIS — I739 Peripheral vascular disease, unspecified: Secondary | ICD-10-CM | POA: Diagnosis present

## 2024-08-16 DIAGNOSIS — Z7982 Long term (current) use of aspirin: Secondary | ICD-10-CM | POA: Diagnosis not present

## 2024-08-16 DIAGNOSIS — I6201 Nontraumatic acute subdural hemorrhage: Secondary | ICD-10-CM | POA: Diagnosis present

## 2024-08-16 LAB — CBC WITH DIFFERENTIAL/PLATELET
Abs Immature Granulocytes: 0.02 K/uL (ref 0.00–0.07)
Basophils Absolute: 0 K/uL (ref 0.0–0.1)
Basophils Relative: 1 %
Eosinophils Absolute: 0.1 K/uL (ref 0.0–0.5)
Eosinophils Relative: 1 %
HCT: 42.1 % (ref 39.0–52.0)
Hemoglobin: 14.2 g/dL (ref 13.0–17.0)
Immature Granulocytes: 0 %
Lymphocytes Relative: 30 %
Lymphs Abs: 2.1 K/uL (ref 0.7–4.0)
MCH: 30.2 pg (ref 26.0–34.0)
MCHC: 33.7 g/dL (ref 30.0–36.0)
MCV: 89.6 fL (ref 80.0–100.0)
Monocytes Absolute: 0.8 K/uL (ref 0.1–1.0)
Monocytes Relative: 12 %
Neutro Abs: 4 K/uL (ref 1.7–7.7)
Neutrophils Relative %: 56 %
Platelets: 270 K/uL (ref 150–400)
RBC: 4.7 MIL/uL (ref 4.22–5.81)
RDW: 12.5 % (ref 11.5–15.5)
WBC: 7.1 K/uL (ref 4.0–10.5)
nRBC: 0 % (ref 0.0–0.2)

## 2024-08-16 LAB — PROTIME-INR
INR: 1.2 (ref 0.8–1.2)
Prothrombin Time: 15.9 s — ABNORMAL HIGH (ref 11.4–15.2)

## 2024-08-16 LAB — TYPE AND SCREEN
ABO/RH(D): O POS
Antibody Screen: NEGATIVE

## 2024-08-16 LAB — ABO/RH: ABO/RH(D): O POS

## 2024-08-16 LAB — SURGICAL PCR SCREEN
MRSA, PCR: NEGATIVE
Staphylococcus aureus: NEGATIVE

## 2024-08-16 LAB — MAGNESIUM: Magnesium: 2.1 mg/dL (ref 1.7–2.4)

## 2024-08-16 SURGERY — CREATION, CRANIAL BURR HOLE
Anesthesia: General | Laterality: Left

## 2024-08-16 MED ORDER — ACETAMINOPHEN 650 MG RE SUPP: 650.0000 mg | Freq: Four times a day (QID) | RECTAL | Status: DC | PRN

## 2024-08-16 MED ORDER — PANTOPRAZOLE SODIUM 40 MG IV SOLR
40.0000 mg | INTRAVENOUS | Status: DC
  Administered 2024-08-16 – 2024-08-17 (×2): 40 mg via INTRAVENOUS
  Filled 2024-08-16 (×2): qty 10

## 2024-08-16 MED ORDER — GLYCOPYRROLATE PF 0.2 MG/ML IJ SOSY
PREFILLED_SYRINGE | INTRAMUSCULAR | Status: AC
  Filled 2024-08-16: qty 1

## 2024-08-16 MED ORDER — HYDRALAZINE HCL 20 MG/ML IJ SOLN: 10.0000 mg | INTRAMUSCULAR | Status: DC | PRN

## 2024-08-16 MED ORDER — LIDOCAINE 2% (20 MG/ML) 5 ML SYRINGE
INTRAMUSCULAR | Status: AC
  Filled 2024-08-16: qty 5

## 2024-08-16 MED ORDER — PROPOFOL 10 MG/ML IV BOLUS
INTRAVENOUS | Status: AC
Start: 2024-08-16 — End: 2024-08-16
  Filled 2024-08-16: qty 20

## 2024-08-16 MED ORDER — OXYCODONE HCL 5 MG PO TABS: 5.0000 mg | ORAL_TABLET | Freq: Four times a day (QID) | ORAL | Status: DC | PRN

## 2024-08-16 MED ORDER — POLYETHYLENE GLYCOL 3350 17 G PO PACK: 17.0000 g | PACK | Freq: Every day | ORAL | Status: DC | PRN

## 2024-08-16 MED ORDER — CHLORHEXIDINE GLUCONATE 0.12 % MT SOLN
OROMUCOSAL | Status: AC
  Filled 2024-08-16: qty 15

## 2024-08-16 MED ORDER — SODIUM CHLORIDE 0.9 % IV SOLN: INTRAVENOUS | Status: DC

## 2024-08-16 MED ORDER — MORPHINE SULFATE (PF) 2 MG/ML IV SOLN: 1.0000 mg | INTRAVENOUS | Status: DC | PRN

## 2024-08-16 MED ORDER — BISACODYL 10 MG RE SUPP: 10.0000 mg | Freq: Every day | RECTAL | Status: DC | PRN

## 2024-08-16 MED ORDER — PROPOFOL 10 MG/ML IV BOLUS
INTRAVENOUS | Status: AC
  Filled 2024-08-16: qty 20

## 2024-08-16 MED ORDER — ROCURONIUM BROMIDE 10 MG/ML (PF) SYRINGE
PREFILLED_SYRINGE | INTRAVENOUS | Status: AC
Start: 2024-08-16 — End: 2024-08-16
  Filled 2024-08-16: qty 10

## 2024-08-16 MED ORDER — ONDANSETRON HCL 4 MG/2ML IJ SOLN: 4.0000 mg | Freq: Four times a day (QID) | INTRAMUSCULAR | Status: DC | PRN

## 2024-08-16 MED ORDER — ALBUTEROL SULFATE (2.5 MG/3ML) 0.083% IN NEBU: 2.5000 mg | INHALATION_SOLUTION | RESPIRATORY_TRACT | Status: DC | PRN

## 2024-08-16 MED ORDER — DEXAMETHASONE SODIUM PHOSPHATE 10 MG/ML IJ SOLN
INTRAMUSCULAR | Status: AC
  Filled 2024-08-16: qty 1

## 2024-08-16 MED ORDER — FENTANYL CITRATE (PF) 250 MCG/5ML IJ SOLN
INTRAMUSCULAR | Status: AC
  Filled 2024-08-16: qty 5

## 2024-08-16 MED ORDER — ONDANSETRON HCL 4 MG/2ML IJ SOLN
INTRAMUSCULAR | Status: AC
  Filled 2024-08-16: qty 2

## 2024-08-16 MED ORDER — HYDRALAZINE HCL 20 MG/ML IJ SOLN: 5.0000 mg | INTRAMUSCULAR | Status: DC | PRN

## 2024-08-16 MED ORDER — ACETAMINOPHEN 325 MG PO TABS
650.0000 mg | ORAL_TABLET | Freq: Four times a day (QID) | ORAL | Status: DC | PRN
  Administered 2024-08-16: 650 mg via ORAL
  Filled 2024-08-16: qty 2

## 2024-08-16 NOTE — Progress Notes (Signed)
 PT Cancellation Note  Patient Details Name: Richard Vaughn MRN: [id number] DOB: 03-27-1951   Cancelled Treatment:    Reason Eval/Treat Not Completed: Medical issues which prohibited therapy (Plan for craniotomy and bur hole today. Acute PT to re-attempt after surgery as medically appropriate.)  Cloyd Ragas W, PT, DPT Secure Chat Preferred  Rehab Office [phone]  [name] [name] 08/16/2024, 10:40 AM

## 2024-08-16 NOTE — Progress Notes (Signed)
 OT Cancellation Note  Patient Details Name: Richard Vaughn MRN: [id number] DOB: [birth year]   Cancelled Treatment:    Reason Eval/Treat Not Completed: Other (comment) (Per chart, planned for craniotomy today with burr hole. Will continue efforts.)   Madilynne Mullan D., MSOT, OTR/L Acute Rehabilitation Services [phone] Secure Chat Preferred  Paisyn Guercio 08/16/2024, 1:52 PM

## 2024-08-16 NOTE — Anesthesia Preprocedure Evaluation (Signed)
 Anesthesia Evaluation  Patient identified by MRN, date of birth, ID band Patient awake    Reviewed: Allergy & Precautions, NPO status , Patient's Chart, lab work & pertinent test results, reviewed documented beta blocker date and time   History of Anesthesia Complications Negative for: history of anesthetic complications  Airway        Dental   Pulmonary sleep apnea , pneumonia, former smoker          Cardiovascular hypertension, + Peripheral Vascular Disease    IMPRESSIONS     1. Left ventricular ejection fraction, by estimation, is 60 to 65%. The  left ventricle has normal function. The left ventricle has no regional  wall motion abnormalities. There is moderate left ventricular hypertrophy  of the basal-septal segment. Left  ventricular diastolic parameters were normal.   2. Right ventricular systolic function is normal. The right ventricular  size is normal. Tricuspid regurgitation signal is inadequate for assessing  PA pressure.   3. The mitral valve is normal in structure. Trivial mitral valve  regurgitation. No evidence of mitral stenosis.   4. The aortic valve was not well visualized. Aortic valve regurgitation  is not visualized. Mild aortic valve sclerosis is present, with no  evidence of aortic valve stenosis.   5. The inferior vena cava is dilated in size with >50% respiratory  variability, suggesting right atrial pressure of 8 mmHg.     Neuro/Psych  Headaches PSYCHIATRIC DISORDERS  Depression    CVA, spastic left-sided hemiplegia   IMPRESSION: 1. Mixed density subdural hematoma overlying the left cerebral hemisphere measuring up to 9 mm in thickness (containing small-volume acute components). Mass effect upon the underlying left cerebral hemisphere with 2 mm rightward midline shift. 2. Background parenchymal atrophy, chronic small vessel ischemic disease and unchanged chronic lacunar infarcts, as  described.   CVA, Residual Symptoms    GI/Hepatic ,GERD  Medicated,,  Endo/Other    Renal/GU Renal disease     Musculoskeletal  (+) Arthritis ,    Abdominal   Peds  Hematology   Anesthesia Other Findings   Reproductive/Obstetrics                              Anesthesia Physical Anesthesia Plan Anesthesia Quick Evaluation

## 2024-08-16 NOTE — H&P (Signed)
 [facility] H&P   Patient Demographics:    Richard Vaughn, is a 73 y.o. male  MRN: [id number]   DOB - May 11, 1951  Admit Date - 08/15/2024  Outpatient Primary MD for the patient is [place], Dickey MATSU, MD  Referring MD/NP/PA: Dr Mannie  Patient coming from: fron home>[facility]  Chief Complaint  Patient presents with   Tachycardia   Diaphoresis      HPI:    Richard Vaughn  is a 73 y.o. male,  with medical history significant of hypertension, hyperlipidemia, CVA, spastic left-sided hemiplegia, PAD, carotid artery disease, GERD, Barrett's esophagus, depression, central sleep apnea, obstructive sleep apnea, chronic pain presenting . - Patient presents to ED due to multiple complaints, per wife who is providing the history, it does appear over the last couple weeks has been having worsening left-sided weakness, unsteady gait, where he had couple falls, but no head trauma, well with these episodes of shakiness he has been having some tachycardia, diaphoresis, where he required to start using walker, as well he been having urinary incontinence over the last month. - In ED his workup was significant for left-sided subdural hematoma, with acute component and 2 mm rightward midline shift, neurosurgery were consulted, who recommended admission to hospitalist service and plan for craniectomy with Parsidol intervention.      Review of systems:     A full 10 point Review of Systems was done, except as stated above, all other Review of Systems were negative.   With Past History of the following :    Past Medical History:  Diagnosis Date   Barrett's esophagus    Cerebrovascular accident (CVA) due to thrombosis of left carotid artery (HCC) 09/23/2015   Chronic back pain    CVA (cerebral vascular accident) (HCC) 09/22/2016   Depression    Gastritis    Mild   Gastroparesis    GERD (gastroesophageal reflux  disease)    Headache    History of kidney stones    Hyperglycemia    Hypertension    ICH (intracerebral hemorrhage) (HCC) - R thalmic/PLIC d/t HTN 93/98/7980   Lacunar infarct, acute (HCC) 08/25/2015   Nephrolithiasis    Pneumonia    Covid pneumonia   Renal cyst    Sleep apnea    Stricture and stenosis of esophagus    Stroke (HCC) 07/2015   Stroke-like symptom 09/22/2016   Treatment-emergent central sleep apnea 10/25/2018   Vision abnormalities       Past Surgical History:  Procedure Laterality Date   CERVICAL DISC SURGERY  2012   COLONOSCOPY     LUMBAR DISC SURGERY  2005, 2010   replaced L4 and L5   SHOULDER ARTHROSCOPY WITH ROTATOR CUFF REPAIR Left 08/14/2020   Procedure: SHOULDER ARTHROSCOPY WITH ROTATOR CUFF REPAIR, SUBACROMIAL DECOMPRESSION;  Surgeon: Dozier Soulier, MD;  Location: [facility];  Service: Orthopedics;  Laterality: Left;   UPPER GASTROINTESTINAL ENDOSCOPY  Social History:     Social History   Tobacco Use   Smoking status: Former    Current packs/day: 0.00    Average packs/day: 0.5 packs/day for 30.0 years (15.0 ttl pk-yrs)    Types: Cigarettes    Start date: 04/15/1988    Quit date: 04/15/2018    Years since quitting: 6.3   Smokeless tobacco: Never  Substance Use Topics   Alcohol use: No    Alcohol/week: 0.0 standard drinks of alcohol     Lives -home with his wife  Mobility -started to use walker recently     Family History :     Family History  Problem Relation Age of Onset   Hypertension Father    Stroke Father    Hypertension Mother    Liver cancer Mother    Hypertension Sister        2 other sisters has also   Stroke Sister    Stroke Sister    Hypertension Brother        2nd brother has also   Colon cancer Neg Hx    Esophageal cancer Neg Hx    Rectal cancer Neg Hx    Stomach cancer Neg Hx    Colon polyps Neg Hx       Home Medications:   Prior to Admission medications   Medication Sig Start Date End Date Taking?  Authorizing Provider  methocarbamol (ROBAXIN) 500 MG tablet Take 500 mg by mouth 3 (three) times daily as needed. 07/28/24  Yes [provider]  tamsulosin  (FLOMAX ) 0.4 MG CAPS capsule Take 0.4 mg by mouth daily. 06/28/24  Yes [provider]  amLODipine  (NORVASC ) 2.5 MG tablet TAKE 1 TABLET BY MOUTH AT BEDTIME. 03/28/24   [place], [name], MD  aspirin  EC 81 MG tablet Take 1 tablet (81 mg total) by mouth daily. Swallow whole. 06/10/20   Whitfield [name], NP  docusate sodium  (COLACE) 100 MG capsule Take 100 mg by mouth daily.    [provider]  DULoxetine  (CYMBALTA ) 60 MG capsule TAKE 1 CAPSULE BY MOUTH EVERY DAY 01/02/24   [place], [name], MD  esomeprazole  (NEXIUM ) 40 MG capsule TAKE 1 CAPSULE (40 MG TOTAL) BY MOUTH 2 (TWO) TIMES DAILY BEFORE A MEAL. 08/06/24   [name], PA-C  lovastatin  (MEVACOR ) 20 MG tablet TAKE 1 TABLET BY MOUTH EVERYDAY AT BEDTIME 03/28/24   [place], [name], MD  Melatonin 10 MG TABS Take 1 tablet by mouth at bedtime.    [provider]  olmesartan  (BENICAR ) 20 MG tablet TAKE 1 TABLET BY MOUTH EVERY DAY 01/02/24   [place], [name], MD  Oxycodone  HCl 10 MG TABS Take 1 tablet (10 mg total) by mouth 2 (two) times daily. 08/13/24   [place], [name], MD  traZODone  (DESYREL ) 50 MG tablet TAKE 1 TABLET BY MOUTH EVERYDAY AT BEDTIME 03/28/24   [place], [name], MD     Allergies:    No Known Allergies   Physical Exam:   Vitals  Blood pressure 122/81, pulse 76, temperature 98.4 F (36.9 C), resp. rate 18, height 6' 3 (1.905 m), weight 111.4 kg, SpO2 98%.   1. General Well-developed male, laying in bed, no apparent distress  2. Normal affect and insight, Not Suicidal or Homicidal, Awake Alert, pleasant, communicative and answering questions appropriately  3.  Patient with slurred speech at baseline, mild left side weakness  4. Ears and Eyes appear Normal, Conjunctivae clear, PERRLA. Moist Oral Mucosa.  5. Supple Neck, No  JVD, No cervical  lymphadenopathy appriciated, No Carotid Bruits.  6. Symmetrical Chest wall movement, Good air movement bilaterally, CTAB.  7. RRR, No Gallops, Rubs or Murmurs, No Parasternal Heave.  8. Positive Bowel Sounds, Abdomen Soft, No tenderness, No organomegaly appriciated,No rebound -guarding or rigidity.  9.  No Cyanosis, Normal Skin Turgor, No Skin Rash or Bruise.  10. Good muscle tone,  joints appear normal , no effusions, Normal ROM.   Data Review:    CBC Recent Labs  Lab 08/15/24 1620 08/16/24 0330  WBC 6.0 7.1  HGB 14.2 14.2  HCT 42.1 42.1  PLT 249 270  MCV 90.1 89.6  MCH 30.4 30.2  MCHC 33.7 33.7  RDW 12.7 12.5  LYMPHSABS 1.9 2.1  MONOABS 0.7 0.8  EOSABS 0.1 0.1  BASOSABS 0.0 0.0   ------------------------------------------------------------------------------------------------------------------  Chemistries  Recent Labs  Lab 08/15/24 1620 08/16/24 0330  NA 137  --   K 4.2  --   CL 103  --   CO2 23  --   GLUCOSE 100*  --   BUN 10  --   CREATININE 0.98  --   CALCIUM  9.3  --   MG  --  2.1  AST 19  --   ALT 21  --   ALKPHOS 210*  --   BILITOT 0.3  --    ------------------------------------------------------------------------------------------------------------------ estimated creatinine clearance is 94.5 mL/min (by C-G formula based on SCr of 0.98 mg/dL). ------------------------------------------------------------------------------------------------------------------ Recent Labs    08/15/24 1620  TSH 0.669    Coagulation profile Recent Labs  Lab 08/16/24 0330  INR 1.2   ------------------------------------------------------------------------------------------------------------------- No results for input(s): DDIMER in the last 72 hours. -------------------------------------------------------------------------------------------------------------------  Cardiac Enzymes No results for input(s): CKMB, TROPONINI, MYOGLOBIN in the last 168  hours.  Invalid input(s): CK ------------------------------------------------------------------------------------------------------------------    Component Value Date/Time   BNP 15.7 05/04/2024 0830     ---------------------------------------------------------------------------------------------------------------  Urinalysis    Component Value Date/Time   COLORURINE DARK YELLOW 08/07/2024 1115   APPEARANCEUR CLEAR 08/07/2024 1115   LABSPEC 1.026 08/07/2024 1115   PHURINE < OR = 5.0 (A) 08/07/2024 1115   GLUCOSEU NEGATIVE 08/07/2024 1115   GLUCOSEU NEGATIVE 08/07/2013 1552   HGBUR NEGATIVE 08/07/2024 1115   BILIRUBINUR SMALL (A) 05/04/2024 0830   BILIRUBINUR n 04/22/2015 1117   KETONESUR NEGATIVE 08/07/2024 1115   PROTEINUR 1+ (A) 08/07/2024 1115   UROBILINOGEN 0.2 04/06/2022 1846   NITRITE NEGATIVE 05/04/2024 0830   LEUKOCYTESUR NEGATIVE 05/04/2024 0830    ----------------------------------------------------------------------------------------------------------------   Imaging Results:    DG Chest 1 View Result Date: 08/15/2024 CLINICAL DATA:  cough EXAM: CHEST  1 VIEW COMPARISON:  Chest x-ray 05/04/2024 CT angio chest 05/04/2024 FINDINGS: The heart and mediastinal contours are unchanged. Atherosclerotic plaque. No focal consolidation. Chronic coarsened interstitial markings with no overt pulmonary edema. No pleural effusion. No pneumothorax. No acute osseous abnormality. IMPRESSION: 1. No active disease. 2. Aortic Atherosclerosis (ICD10-I70.0) and Emphysema (ICD10-J43.9). Electronically Signed   By: Morgane  Naveau M.D.   On: 08/15/2024 17:06   CT Head Wo Contrast Result Date: 08/15/2024 CLINICAL DATA:  Provided history: Stroke, follow-up. EXAM: CT HEAD WITHOUT CONTRAST TECHNIQUE: Contiguous axial images were obtained from the base of the skull through the vertex without intravenous contrast. RADIATION DOSE REDUCTION: This exam was performed according to the departmental  dose-optimization program which includes automated exposure control, adjustment of the mA and/or kV according to patient size and/or use of iterative reconstruction technique. COMPARISON:  Brain MRI 06/09/2023.  Head CT 06/08/2023.  FINDINGS: Brain: Mild generalized cerebral atrophy. Subdural hematoma overlying the left cerebral hemisphere measuring up to 9 mm in thickness (for instance as seen on series 9, image 24). The hematoma is predominantly hypodense. However, there are small-volume hyperdense components of acute hemorrhage within the collection. Mass effect upon the underlying left cerebral hemisphere with 2 mm rightward midline shift. Chronic lacunar infarcts within the left cerebral hemispheric white matter and within/about the bilateral deep gray nuclei, not appreciably changed from the prior MRI of 06/09/2023. Background advanced patchy and confluent hypoattenuation within the cerebral white matter, nonspecific but compatible with chronic small vessel ischemic disease. No acute demarcated cortical infarct. No evidence of an intracranial mass. Vascular: No hyperdense vessel.  Atherosclerotic calcifications. Skull: No calvarial fracture or aggressive osseous lesion. Sinuses/Orbits: No mass or acute finding within the imaged orbits. No significant paranasal sinus disease. Other: Small left mastoid effusion. Impression #1 called by telephone at the time of interpretation on 08/15/2024 at 5:03 pm to provider [facility] , who verbally acknowledged these results. IMPRESSION: 1. Mixed density subdural hematoma overlying the left cerebral hemisphere measuring up to 9 mm in thickness (containing small-volume acute components). Mass effect upon the underlying left cerebral hemisphere with 2 mm rightward midline shift. 2. Background parenchymal atrophy, chronic small vessel ischemic disease and unchanged chronic lacunar infarcts, as described. Electronically Signed   By: Rockey Childs D.O.   On: 08/15/2024 17:05      EKG:   Vent. rate 78 BPM PR interval 143 ms QRS duration 109 ms QT/QTcB 382/436 ms P-R-T axes 68 49 63 Sinus rhythm Abnormal R-wave progression, early transition Minimal ST elevation, inferior leads Baseline wander in lead(s) I V1   Assessment & Plan:    Principal Problem:   Subdural hematoma (HCC) Active Problems:   PAD (peripheral artery disease)   Hyperlipidemia with target LDL less than 70   Chronic pain syndrome   Essential hypertension   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)   Systolic hypertension with cerebrovascular disease   Sleep apnea   Obesity, Class I, BMI 30-34.9    Acute on chronic subdural hematoma - Presents with falls (no head trauma), worsening gait, and incontinence - CT head with acute on subdural hematoma with midline shift -Continue to hold aspirin . - SCD for DVT prophylaxis. - Neurosurgery input greatly appreciated, plan for craniotomy and bur hole today    History of stroke with left-sided hemiparesis  PAD -On aspirin  and statin -Hold aspirin  due to above, can be resumed once cleared by neurosurgery -Resume statin when able to take oral - Consult PT/OT/SLP  Hyperlipidemia -Resume statin when able to take oral after surgery   Essential hypertension -Currently blood pressure is low to low normal, so we will hold on resuming home medications . - Will keep on as needed hydralazine     History of depression Chronic pain syndrome He is currently n.p.o., continue with home Cymbalta  once cleared to take oral  History of GERD and Barrett's esophagus -Keep on IV PPI   Obesity class 1 - Body mass index is 30.7 kg/m.  BPH -Continue with Flomax  when cleared to take oral by neurosurgery -Wife reports history of incontinence, but he has no urinary retention, will continue to monitor closely if he develops any sign of retention . - Will check UA.     DVT Prophylaxis SCDs   AM Labs Ordered,  also please review Full Orders  Family Communication: Admission, patients condition and plan of  care including tests being ordered have been discussed with the patient and wife at bedside who indicate understanding and agree with the plan and Code Status.  Code Status full code  Likely DC to pending further workup  Consults called: Neurosurgery  Admission status: Inpatient  Time spent in minutes : 70 minutes   Brayton Lye M.D on 08/16/2024 at 7:51 AM   [facility] - Office  [phone]

## 2024-08-16 NOTE — Progress Notes (Signed)
 Attempted to receive report before the pt arrives to pre-op.

## 2024-08-16 NOTE — Progress Notes (Signed)
 Subjective: The patient is alert and pleasant.  His wife and daughter and other family members are at the bedside.  He has no complaints.  Objective: Vital signs in last 24 hours: Temp:  [97.9 F (36.6 C)-98.6 F (37 C)] 98 F (36.7 C) (10/02 1616) Pulse Rate:  [66-79] 74 (10/02 1616) Resp:  [13-21] 19 (10/02 1616) BP: (99-133)/(69-99) 112/83 (10/02 1616) SpO2:  [92 %-98 %] 97 % (10/02 1616) Weight:  [111.4 kg] 111.4 kg (10/02 0500) Estimated body mass index is 30.7 kg/m as calculated from the following:   Height as of this encounter: 6' 3 (1.905 m).   Weight as of this encounter: 111.4 kg.   Intake/Output from previous day: No intake/output data recorded. Intake/Output this shift: No intake/output data recorded.  Physical exam patient is alert and oriented.  He is dysphasic and right Hemi paretic from an old left-sided stroke.  His right strength is normal.  Lab Results: Recent Labs    08/15/24 1620 08/16/24 0330  WBC 6.0 7.1  HGB 14.2 14.2  HCT 42.1 42.1  PLT 249 270   BMET Recent Labs    08/15/24 1620  NA 137  K 4.2  CL 103  CO2 23  GLUCOSE 100*  BUN 10  CREATININE 0.98  CALCIUM  9.3    Studies/Results: DG Chest 1 View Result Date: 08/15/2024 CLINICAL DATA:  cough EXAM: CHEST  1 VIEW COMPARISON:  Chest x-ray 05/04/2024 CT angio chest 05/04/2024 FINDINGS: The heart and mediastinal contours are unchanged. Atherosclerotic plaque. No focal consolidation. Chronic coarsened interstitial markings with no overt pulmonary edema. No pleural effusion. No pneumothorax. No acute osseous abnormality. IMPRESSION: 1. No active disease. 2. Aortic Atherosclerosis (ICD10-I70.0) and Emphysema (ICD10-J43.9). Electronically Signed   By: Morgane  Naveau M.D.   On: 08/15/2024 17:06   CT Head Wo Contrast Result Date: 08/15/2024 CLINICAL DATA:  Provided history: Stroke, follow-up. EXAM: CT HEAD WITHOUT CONTRAST TECHNIQUE: Contiguous axial images were obtained from the base of the  skull through the vertex without intravenous contrast. RADIATION DOSE REDUCTION: This exam was performed according to the departmental dose-optimization program which includes automated exposure control, adjustment of the mA and/or kV according to patient size and/or use of iterative reconstruction technique. COMPARISON:  Brain MRI 06/09/2023.  Head CT 06/08/2023. FINDINGS: Brain: Mild generalized cerebral atrophy. Subdural hematoma overlying the left cerebral hemisphere measuring up to 9 mm in thickness (for instance as seen on series 9, image 24). The hematoma is predominantly hypodense. However, there are small-volume hyperdense components of acute hemorrhage within the collection. Mass effect upon the underlying left cerebral hemisphere with 2 mm rightward midline shift. Chronic lacunar infarcts within the left cerebral hemispheric white matter and within/about the bilateral deep gray nuclei, not appreciably changed from the prior MRI of 06/09/2023. Background advanced patchy and confluent hypoattenuation within the cerebral white matter, nonspecific but compatible with chronic small vessel ischemic disease. No acute demarcated cortical infarct. No evidence of an intracranial mass. Vascular: No hyperdense vessel.  Atherosclerotic calcifications. Skull: No calvarial fracture or aggressive osseous lesion. Sinuses/Orbits: No mass or acute finding within the imaged orbits. No significant paranasal sinus disease. Other: Small left mastoid effusion. Impression #1 called by telephone at the time of interpretation on 08/15/2024 at 5:03 pm to provider [facility] , who verbally acknowledged these results. IMPRESSION: 1. Mixed density subdural hematoma overlying the left cerebral hemisphere measuring up to 9 mm in thickness (containing small-volume acute components). Mass effect upon the underlying left cerebral hemisphere with 2 mm  rightward midline shift. 2. Background parenchymal atrophy, chronic small vessel  ischemic disease and unchanged chronic lacunar infarcts, as described. Electronically Signed   By: Rockey Childs D.O.   On: 08/15/2024 17:05    Assessment/Plan: Subdural hematoma: I have discussed situation with the patient and his family.  We discussed the various treatment options including observation, bur holes versus middle meningeal artery embolization.  Since the patient is not particularly symptomatic from the subdural hematoma, it is relatively small and has mild mass effect, I think his best treatment option is middle meningeal artery embolization in the hopes of avoiding the risk of surgery since he has multiple other medical issues.  I discussed this fully with the patient's wife and daughter who are both nurses as well as the patient.  I have answered all their questions.  They agree with embolization plan to follow him with serial CTs.  I have spoken to Dr. Lanis.  He may be able to do it tomorrow.  LOS: 0 days     Richard Vaughn 08/16/2024, 5:43 PM     Patient ID: Richard Vaughn, male   DOB: [birth year], 73 y.o.   MRN: [id number]

## 2024-08-16 NOTE — TOC Initial Note (Signed)
 Transition of Care [facility]) - Initial/Assessment Note    Patient Details  Name: Richard Vaughn MRN: [id number] Date of Birth: 19-Jun-1951  Transition of Care [facility]) CM/SW Contact:    Andrez JULIANNA George, RN Phone Number: 08/16/2024, 3:06 PM  Clinical Narrative:                 Patient  has been having worsening left-sided weakness, unsteady gait, where he had couple falls, but no head trauma, well with these episodes of shakiness he has been having some tachycardia, diaphoresis, where he required to start using walker, as well he been having urinary incontinence over the last month.   Pt is from home with his spouse that is with him 90% of the time.  Wife manages his medications and provides needed transportation.  Per family they are waiting on coiling for pt.   Therapies to eval post procedure.  IP Care management following.  Expected Discharge Plan:  (TBD) Barriers to Discharge: Continued Medical Work up   Patient Goals and CMS Choice   CMS Medicare.gov Compare Post Acute Care list provided to:: Patient Choice offered to / list presented to : Patient, Spouse      Expected Discharge Plan and Services   Discharge Planning Services: CM Consult   Living arrangements for the past 2 months: Single Family Home                                      Prior Living Arrangements/Services Living arrangements for the past 2 months: Single Family Home Lives with:: Spouse Patient language and need for interpreter reviewed:: Yes Do you feel safe going back to the place where you live?: Yes          Current home services: DME (BSC/ shower seat/ transport chair/ walker/ cane/ wheelchair ordred) Criminal Activity/Legal Involvement Pertinent to Current Situation/Hospitalization: No - Comment as needed  Activities of Daily Living   ADL Screening (condition at time of admission) Independently performs ADLs?: No Does the patient have a NEW difficulty with  bathing/dressing/toileting/self-feeding that is expected to last >3 days?: No Does the patient have a NEW difficulty with getting in/out of bed, walking, or climbing stairs that is expected to last >3 days?: No Does the patient have a NEW difficulty with communication that is expected to last >3 days?: No Is the patient deaf or have difficulty hearing?: No Does the patient have difficulty seeing, even when wearing glasses/contacts?: No Does the patient have difficulty concentrating, remembering, or making decisions?: No  Permission Sought/Granted                  Emotional Assessment Appearance:: Appears stated age   Affect (typically observed): Quiet Orientation: : Oriented to Self, Oriented to Place, Oriented to  Time, Oriented to Situation   Psych Involvement: No (comment)  Admission diagnosis:  Subdural hematoma (HCC) [S06.5XAA] Patient Active Problem List   Diagnosis Date Noted   Subdural hematoma (HCC) 08/15/2024   Ascending aorta dilation 05/09/2024   Mucus plugging of bronchi 05/05/2024   Obesity, Class I, BMI 30-34.9 05/05/2024   Acute respiratory failure with hypoxia (HCC) 05/04/2024   Dystonia of extremity 11/17/2023   Adhesive capsulitis of left shoulder 11/17/2023   Marijuana use 06/09/2023   History of intracranial hemorrhage 06/09/2023   Drug-induced constipation 06/08/2022   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) 12/09/2021   Depression,  major, recurrent, mild    Sleep disturbance    Essential hypertension    Left thyroid  nodule    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) 10/25/2018   Chronic pain syndrome    Chronic bilateral low back pain    Dysphagia, post-stroke    Insomnia 07/13/2017   PAD (peripheral artery disease) 07/13/2017   Hyperlipidemia with target LDL less than 70 07/13/2017   Paresthesia 09/22/2016   Primary snoring 09/23/2015   Obstructive sleep apnea 09/23/2015   Sleep  apnea 08/25/2015   Dysarthria    CVD (cardiovascular disease) 08/02/2015   Systolic hypertension with cerebrovascular disease 12/27/2014   NEPHROLITHIASIS 05/23/2009   BARRETTS ESOPHAGUS 02/20/2009   Gastroparesis 02/20/2009   RADICULOPATHY 10/21/2008   GERD 10/08/2008   ESOPHAGEAL STRICTURE 08/15/2008   RENAL CYST 08/14/2008   PULMONARY NODULE 01/10/2008   PCP:  [place], [name], MD Pharmacy:   [facility] - [place], [place] - [address] [facility] AT Portal to [facility] [address] [place] [address] Phone: [phone] Fax: [phone]  [id number] [place] [place] - [address] AT [address] OF [facility] [address] [place] [address] Phone: [phone] Fax: [phone]  [facility] [place] [facility] [address] [place] [address] Phone: [phone] Fax: [phone]     Social Drivers of Health (SDOH) Social History: SDOH Screenings   Food Insecurity: No Food Insecurity (08/16/2024)  Housing: Low Risk  (08/16/2024)  Transportation Needs: No Transportation Needs (08/16/2024)  Utilities: Not At Risk (08/16/2024)  Alcohol Screen: Low Risk  (04/27/2024)  Depression (PHQ2-9): Low Risk  (04/27/2024)  Financial Resource Strain: Low Risk  (04/27/2024)  Physical Activity: Sufficiently Active (04/27/2024)  Social Connections: Moderately Isolated (08/16/2024)  Stress: No Stress Concern Present (04/27/2024)  Tobacco Use: Medium Risk (08/16/2024)  Health Literacy: Adequate Health Literacy (04/27/2024)   SDOH Interventions:     Readmission Risk Interventions     No data to display

## 2024-08-16 NOTE — Progress Notes (Signed)
 SLP Cancellation Note  Patient Details Name: Richard Vaughn MRN: [id number] DOB: 04-11-1951   Cancelled evaluation: Orders received for bedside swallow evaluation after procedure is completed.  Will f/u for readiness after craniectomy.  Lige Lakeman L. Vona, MA CCC/SLP Clinical Specialist - Acute Care SLP Acute Rehabilitation Services Office number [phone]         Vona Palma Laurice 08/16/2024, 10:22 AM

## 2024-08-16 NOTE — Progress Notes (Signed)
(  Carryover admission to the Day Admitter; accepted by Dr.  Rojelio as transfer from  [facility]  to a  pcu bed at  [facility]  for subdural hematoma. Please see Dr.  Bella transfer documentation in [facility] Communication for additional details).    I have placed some additional preliminary admit orders via the adult multi-morbid admission order set. I have also ordered NPO in anticipation of neurosurgery's plan for burr hole.  Also ordered morning labs to include CMP, CBC, magnesium  level as well as INR.  Also ordered type and screen as well as prn IV hydralazine  and fall precautions.     Eva Pore, DO Hospitalist

## 2024-08-17 ENCOUNTER — Inpatient Hospital Stay (HOSPITAL_COMMUNITY)

## 2024-08-17 DIAGNOSIS — R569 Unspecified convulsions: Secondary | ICD-10-CM | POA: Diagnosis not present

## 2024-08-17 DIAGNOSIS — R4182 Altered mental status, unspecified: Secondary | ICD-10-CM

## 2024-08-17 DIAGNOSIS — S065XAA Traumatic subdural hemorrhage with loss of consciousness status unknown, initial encounter: Secondary | ICD-10-CM | POA: Diagnosis not present

## 2024-08-17 LAB — BASIC METABOLIC PANEL WITH GFR
Anion gap: 9 (ref 5–15)
BUN: 10 mg/dL (ref 8–23)
CO2: 22 mmol/L (ref 22–32)
Calcium: 8.6 mg/dL — ABNORMAL LOW (ref 8.9–10.3)
Chloride: 107 mmol/L (ref 98–111)
Creatinine, Ser: 1 mg/dL (ref 0.61–1.24)
GFR, Estimated: >60 mL/min (ref >60)
Glucose, Bld: 98 mg/dL (ref 70–99)
Potassium: 4 mmol/L (ref 3.5–5.1)
Sodium: 138 mmol/L (ref 135–145)

## 2024-08-17 LAB — CBC
HCT: 40.2 % (ref 39.0–52.0)
HCT: 41.8 % (ref 39.0–52.0)
Hemoglobin: 13.7 g/dL (ref 13.0–17.0)
Hemoglobin: 14.1 g/dL (ref 13.0–17.0)
MCH: 30.4 pg (ref 26.0–34.0)
MCH: 30.5 pg (ref 26.0–34.0)
MCHC: 34.1 g/dL (ref 30.0–36.0)
MCHC: 33.7 g/dL (ref 30.0–36.0)
MCV: 89.3 fL (ref 80.0–100.0)
MCV: 90.5 fL (ref 80.0–100.0)
Platelets: 243 K/uL (ref 150–400)
Platelets: 254 K/uL (ref 150–400)
RBC: 4.5 MIL/uL (ref 4.22–5.81)
RBC: 4.62 MIL/uL (ref 4.22–5.81)
RDW: 12.5 % (ref 11.5–15.5)
RDW: 12.5 % (ref 11.5–15.5)
WBC: 7.7 K/uL (ref 4.0–10.5)
WBC: 7 K/uL (ref 4.0–10.5)
nRBC: 0 % (ref 0.0–0.2)
nRBC: 0 % (ref 0.0–0.2)

## 2024-08-17 LAB — VITAMIN B12: Vitamin B-12: 555 pg/mL (ref 180–914)

## 2024-08-17 LAB — SEDIMENTATION RATE: Sed Rate: 3 mm/h (ref 0–16)

## 2024-08-17 LAB — TSH: TSH: 0.485 u[IU]/mL (ref 0.350–4.500)

## 2024-08-17 LAB — FOLATE: Folate: 15.7 ng/mL (ref >5.9)

## 2024-08-17 MED ORDER — IRBESARTAN 150 MG PO TABS
150.0000 mg | ORAL_TABLET | Freq: Every day | ORAL | Status: DC
  Administered 2024-08-17 – 2024-08-18 (×2): 150 mg via ORAL
  Filled 2024-08-17 (×2): qty 1

## 2024-08-17 MED ORDER — PRAVASTATIN SODIUM 40 MG PO TABS
20.0000 mg | ORAL_TABLET | Freq: Every day | ORAL | Status: DC
  Administered 2024-08-17: 20 mg via ORAL
  Filled 2024-08-17: qty 1

## 2024-08-17 MED ORDER — PANTOPRAZOLE SODIUM 40 MG PO TBEC
40.0000 mg | DELAYED_RELEASE_TABLET | Freq: Every day | ORAL | Status: DC
  Administered 2024-08-17 – 2024-08-18 (×2): 40 mg via ORAL
  Filled 2024-08-17 (×2): qty 1

## 2024-08-17 MED ORDER — AMLODIPINE BESYLATE 2.5 MG PO TABS
2.5000 mg | ORAL_TABLET | Freq: Every day | ORAL | Status: DC
  Administered 2024-08-17: 2.5 mg via ORAL
  Filled 2024-08-17: qty 1

## 2024-08-17 MED ORDER — DULOXETINE HCL 60 MG PO CPEP
60.0000 mg | ORAL_CAPSULE | Freq: Every day | ORAL | Status: DC
  Administered 2024-08-17 – 2024-08-18 (×2): 60 mg via ORAL
  Filled 2024-08-17 (×2): qty 1

## 2024-08-17 NOTE — Procedures (Signed)
 Patient Name: Richard Vaughn  MRN: [id number]  Epilepsy Attending: Arlin MALVA Krebs  Referring Physician/Provider: Jennetta Gerard JONETTA, NP  Date: 08/17/2024 Duration: 28.20 mins  Patient history: 73yo M with ams. EEG to evaluate for seizure  Level of alertness: Awake  AEDs during EEG study: None  Technical aspects: This EEG study was done with scalp electrodes positioned according to the 10-20 International system of electrode placement. Electrical activity was reviewed with band pass filter of 1-70Hz , sensitivity of 7 uV/mm, display speed of 72mm/sec with a 60Hz  notched filter applied as appropriate. EEG data were recorded continuously and digitally stored.  Video monitoring was available and reviewed as appropriate.  Description: The posterior dominant rhythm consists of 9 Hz activity of moderate voltage (25-35 uV) seen predominantly in posterior head regions, symmetric and reactive to eye opening and eye closing. Hyperventilation and photic stimulation were not performed.     IMPRESSION: This study is within normal limits. No seizures or epileptiform discharges were seen throughout the recording.  A normal interictal EEG does not exclude the diagnosis of epilepsy.   Arti Trang O Kristoffer Bala

## 2024-08-17 NOTE — Evaluation (Signed)
 Occupational Therapy Evaluation Patient Details Name: Richard Vaughn MRN: [id number] DOB: 1951-01-14 Today's Date: 08/17/2024   History of Present Illness   73 y.o. male admitted to [facility] 08/15/24 with worsening L sided weakness and unsteady gait. Workup significant for L sided SDH with acute component and 2mm R midline shift. PMhx: HTN, HLD, CVA, spastic left-sided hemiplegia, PAD, carotid artery disease, GERD, Barrett's esophagus, depression, central sleep apnea, OSA, chronic pain     Clinical Impressions Pt greeted in supine, supportive wife at bedside. Agreeable for OT eval. PTA, pt was previously independent with ADLs (with exception being LB bathing/dressing 2/2 recent fall with resultant compression fx in back) and mobility without AD. He just finished with HHOT/PT ~1 month prior. He presents with LUE residual paresis from prior CVA, RUE grossly WFL. Impaired sequencing and cog/comm - wife reports is pt's baseline. Functionally, he needed mod A for UB/LB ADLs and mod A +2 to stand from the bed. Min A at most for in-room functional ambulation via RW.   Pt is currently functioning below baseline and would benefit from ongoing acute OT services to progress towards safe discharge and to facilitate return to prior level of function. Current recommendation is home with home health OT and near 24 hr supervision/assist.     If plan is discharge home, recommend the following:   A little help with walking and/or transfers;A lot of help with bathing/dressing/bathroom;Assistance with cooking/housework;Direct supervision/assist for medications management;Direct supervision/assist for financial management;Assist for transportation;Supervision due to cognitive status     Functional Status Assessment   Patient has had a recent decline in their functional status and demonstrates the ability to make significant improvements in function in a reasonable and predictable amount of time.     Equipment  Recommendations   None recommended by OT (currently have adequate DME)     Recommendations for Other Services         Precautions/Restrictions   Precautions Precautions: Fall Recall of Precautions/Restrictions: Intact Restrictions Weight Bearing Restrictions Per Provider Order: No     Mobility Bed Mobility Overal bed mobility: Needs Assistance Bed Mobility: Rolling, Sidelying to Sit Rolling: Min assist, Used rails Sidelying to sit: Mod assist, Used rails       General bed mobility comments: 1-step sequencing cues for task initiation/execution, assist for trunk elevation    Transfers Overall transfer level: Needs assistance Equipment used: Rolling walker (2 wheels) Transfers: Sit to/from Stand Sit to Stand: Mod assist, +2 physical assistance, +2 safety/equipment           General transfer comment: Stood from bed with mod A +2 for powering up and cued for hand placement. Stood again from chair height with wife assiting safely +1 assist.      Balance Overall balance assessment: Needs assistance, History of Falls, Mild deficits observed, not formally tested Sitting-balance support: No upper extremity supported, Feet supported Sitting balance-Leahy Scale: Fair Sitting balance - Comments: seated EOB, occasional posterior lean when engaged in UE MMT Postural control: Posterior lean (brief) Standing balance support: Bilateral upper extremity supported, During functional activity, Reliant on assistive device for balance Standing balance-Leahy Scale: Poor Standing balance comment: heavily reliant on RW for ambulation; able to maintain dynamic standing with CGA during standing ADLs at the sink.                           ADL either performed or assessed with clinical judgement   ADL Overall ADL's : Needs assistance/impaired  Grooming: Minimal assistance;Cueing for sequencing;Standing;Oral care;Wash/dry hands Grooming Details (indicate cue type and  reason): cog cueing         Upper Body Dressing : Moderate assistance;Cueing for sequencing;Sitting Upper Body Dressing Details (indicate cue type and reason): gown exchange, cog cues Lower Body Dressing: Moderate assistance;Cueing for sequencing;Sitting/lateral leans               Functional mobility during ADLs: Minimal assistance;Rolling walker (2 wheels);Cueing for safety;Cueing for sequencing       Vision Baseline Vision/History: 1 Wears glasses Ability to See in Adequate Light: 0 Adequate Patient Visual Report: No change from baseline Vision Assessment?:  (wears glasses for distance)     Perception         Praxis         Pertinent Vitals/Pain Pain Assessment Pain Assessment: No/denies pain     Extremity/Trunk Assessment Upper Extremity Assessment Upper Extremity Assessment: Right hand dominant;LUE deficits/detail LUE Deficits / Details: residual LHB weakness from prior CVA - LUE following extensor synergistic pattern; grossly 2/5 LUE Coordination: decreased fine motor;decreased gross motor   Lower Extremity Assessment Lower Extremity Assessment: Defer to PT evaluation LLE Deficits / Details: L hip flexion 4/5, L knee ext 5/5, Ankle DF 5/5 LLE Sensation: WNL LLE Coordination: decreased gross motor   Cervical / Trunk Assessment Cervical / Trunk Assessment: Normal   Communication Communication Communication: Impaired Factors Affecting Communication: Reduced clarity of speech;Difficulty expressing self   Cognition Arousal: Alert Behavior During Therapy: Flat affect Cognition: History of cognitive impairments (anticipate 2/2 prior CVA)             OT - Cognition Comments: pleasant & participatory; minimally verbally communicative; requires cog cues for sequencing and task initiation                 Following commands: Impaired Following commands impaired: Only follows one step commands consistently, Follows one step commands with increased  time, Follows multi-step commands inconsistently     Cueing  General Comments   Cueing Techniques: Verbal cues  supportive wife present throughout session   Exercises     Shoulder Instructions      Home Living Family/patient expects to be discharged to:: Private residence Living Arrangements: Spouse/significant other Available Help at Discharge: Family;Available 24 hours/day (mostly) Type of Home: House Home Access: Stairs to enter [employer] of Steps: 1 Entrance Stairs-Rails: Right;Left;Can reach both Home Layout: Two level;Laundry or work area in basement;Able to live on main level with bedroom/bathroom     Bathroom Shower/Tub: Producer, television/film/video: Administrator Accessibility: Yes   Home Equipment: Agricultural consultant (2 wheels);Cane - single point;BSC/3in1;Shower seat          Prior Functioning/Environment Prior Level of Function : Needs assist;History of Falls (last six months)       Physical Assist : Mobility (physical);ADLs (physical) Mobility (physical): Transfers ADLs (physical): Bathing;Dressing (LB ADLs since recent fall with resultant compression fractures in back per wife) Mobility Comments: typically ind with no AD, cane or RW for gait recently. Wife close by for steps. At least 4 falls ADLs Comments: normally Ind with no AD, recently wife assists with LB dressing, medication management, to get into the shower and to drive    OT Problem List: Decreased strength;Decreased range of motion;Impaired balance (sitting and/or standing);Decreased cognition;Impaired sensation;Impaired tone;Impaired UE functional use   OT Treatment/Interventions: Self-care/ADL training;Energy conservation;DME and/or AE instruction;Therapeutic activities;Cognitive remediation/compensation;Patient/family education;Balance training;Therapeutic exercise      OT Goals(Current goals can be  found in the care plan section)   Acute Rehab OT Goals Patient  Stated Goal: pt did not state goal; pt's spouse amenable to [facility] services OT Goal Formulation: With patient/family Time For Goal Achievement: 08/31/24 Potential to Achieve Goals: Good   OT Frequency:  Min 2X/week    Co-evaluation              AM-PAC OT 6 Clicks Daily Activity     Outcome Measure Help from another person eating meals?: A Little Help from another person taking care of personal grooming?: A Lot Help from another person toileting, which includes using toliet, bedpan, or urinal?: A Lot Help from another person bathing (including washing, rinsing, drying)?: A Lot Help from another person to put on and taking off regular upper body clothing?: A Lot Help from another person to put on and taking off regular lower body clothing?: A Lot 6 Click Score: 13   End of Session Equipment Utilized During Treatment: Gait belt;Rolling walker (2 wheels) Nurse Communication: Mobility status  Activity Tolerance: Patient tolerated treatment well Patient left: in chair;with call bell/phone within reach;with chair alarm set;with family/visitor present  OT Visit Diagnosis: Unsteadiness on feet (R26.81);Muscle weakness (generalized) (M62.81)                Time: 8952-8876 OT Time Calculation (min): 36 min Charges:  OT General Charges $OT Visit: 1 Visit OT Evaluation $OT Eval Moderate Complexity: 1 Mod OT Treatments $Self Care/Home Management : 8-22 mins  Harumi Yamin D., MSOT, OTR/L Acute Rehabilitation Services [phone] Secure Chat Preferred  Rikki Milch 08/17/2024, 1:48 PM

## 2024-08-17 NOTE — Evaluation (Signed)
 Speech Language Pathology Evaluation Patient Details Name: Richard Vaughn MRN: [id number] DOB: Jan 19, 1951 Today's Date: 08/17/2024 Time: 8475-8459 SLP Time Calculation (min) (ACUTE ONLY): 16 min  Problem List:  Patient Active Problem List   Diagnosis Date Noted   Subdural hematoma (HCC) 08/15/2024   Ascending aorta dilation 05/09/2024   Mucus plugging of bronchi 05/05/2024   Obesity, Class I, BMI 30-34.9 05/05/2024   Acute respiratory failure with hypoxia (HCC) 05/04/2024   Dystonia of extremity 11/17/2023   Adhesive capsulitis of left shoulder 11/17/2023   Marijuana use 06/09/2023   History of intracranial hemorrhage 06/09/2023   Drug-induced constipation 06/08/2022   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) 12/09/2021   Depression, major, recurrent, mild    Sleep disturbance    Essential hypertension    Left thyroid  nodule    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) 10/25/2018   Chronic pain syndrome    Chronic bilateral low back pain    Dysphagia, post-stroke    Insomnia 07/13/2017   PAD (peripheral artery disease) 07/13/2017   Hyperlipidemia with target LDL less than 70 07/13/2017   Paresthesia 09/22/2016   Primary snoring 09/23/2015   Obstructive sleep apnea 09/23/2015   Sleep apnea 08/25/2015   Dysarthria    CVD (cardiovascular disease) 08/02/2015   Systolic hypertension with cerebrovascular disease 12/27/2014   NEPHROLITHIASIS 05/23/2009   BARRETTS ESOPHAGUS 02/20/2009   Gastroparesis 02/20/2009   RADICULOPATHY 10/21/2008   GERD 10/08/2008   ESOPHAGEAL STRICTURE 08/15/2008   RENAL CYST 08/14/2008   PULMONARY NODULE 01/10/2008   Past 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) 09/23/2015   Chronic back pain    CVA (cerebral vascular accident) (HCC) 09/22/2016   Depression     Gastritis    Mild   Gastroparesis    GERD (gastroesophageal reflux disease)    Headache    History of kidney stones    Hyperglycemia    Hypertension    ICH (intracerebral hemorrhage) (HCC) - R thalmic/PLIC d/t HTN 93/98/7980   Lacunar infarct, acute (HCC) 08/25/2015   Nephrolithiasis    Pneumonia    Covid pneumonia   Renal cyst    Sleep apnea    Stricture and stenosis of esophagus    Stroke (HCC) 07/2015   Stroke-like symptom 09/22/2016   Treatment-emergent central sleep apnea 10/25/2018   Vision abnormalities    Past Surgical History:  Past Surgical History:  Procedure Laterality Date   CERVICAL DISC SURGERY  2012   COLONOSCOPY     LUMBAR DISC SURGERY  2005, 2010   replaced L4 and L5   SHOULDER ARTHROSCOPY WITH ROTATOR CUFF REPAIR Left 08/14/2020   Procedure: SHOULDER ARTHROSCOPY WITH ROTATOR CUFF REPAIR, SUBACROMIAL DECOMPRESSION;  Surgeon: Dozier Soulier, MD;  Location: [facility];  Service: Orthopedics;  Laterality: Left;   UPPER GASTROINTESTINAL ENDOSCOPY     HPI:  Richard Vaughn  is a 73 y.o. male, with medical history significant of hypertension, hyperlipidemia, CVA, spastic left-sided hemiplegia, history of silent aspiration 11/2022 (on thickener, disliked it and discontinued d/t hydration concerns), PAD, carotid artery disease, GERD, Barrett's esophagus, depression, central sleep apnea, obstructive sleep apnea, chronic pain presenting with worsening left-sided weakness past 2 weeks, unsteady gait, In ED his workup was significant for left-sided subdural hematoma, with acute component and 2 mm rightward midline shift.  BSE 05/05/24 baseline cough and cough after [facility].  Recommended reg/thin. MBS 05/07/24 silent aspiration of thin, cough not sufficient to expel, no aspiration with nectar but doubt that nectar would prevent aspiration. Discussed methods to decrease risk with wife, oral care, primarily drinking water, resuming EMST and Provale cup.   Assessment / Plan /  Recommendation Clinical Impression  SLP administered MMSE per MD request. He scored 23/30, based on scores listed below. He exhibited difficulty with orientation and attention, but did a little better with language. He recalled 2/3 words during a delayed recall task, unable to retrieve the third word even when given multiple choices. There is some dysarthria present as well, but pt and his wife both report that the pt is at his cognitive and communicative baseline. Given that he is at his baseline, no further SLP f/u is indicated at this time.   Orientation:  3                        4  Registration: 3 Attention:      3 Recall:           3 Language:     2                        1                        3                        1                         0                        1   SLP Assessment  SLP Recommendation/Assessment: Patient does not need any further Speech Language Pathology Services SLP Visit Diagnosis: Cognitive communication deficit (R41.841)     Assistance Recommended at Discharge     Functional Status Assessment Patient has not had a recent decline in their functional status  Frequency and Duration           SLP Evaluation Cognition  Overall Cognitive Status: History of cognitive impairments - at baseline       Comprehension  Auditory Comprehension Overall Auditory Comprehension: Impaired at baseline    Expression Expression Primary Mode of Expression: Verbal Verbal Expression Overall Verbal Expression: Appears within functional limits for tasks assessed   Oral / Motor  Oral Motor/Sensory Function Overall Oral Motor/Sensory Function: Within functional limits Motor Speech Overall Motor Speech: Impaired at baseline             Leita SAILOR., M.A. CCC-SLP Acute Rehabilitation Services Office: [phone]  Secure chat preferred  08/17/2024, 3:48 PM

## 2024-08-17 NOTE — Progress Notes (Signed)
   Providing Compassionate, Quality Care - Together   Spoke with Richard Vaughn regarding the decision for conservative management of Richard Vaughn SDH. Will arrange outpatient CT scan and follow up appointment with Dr. Mavis. I answered all of their questions.    Gerard Beck, DNP, AGNP-C Nurse Practitioner  [facility] [address], [address], [place], [place] [address] P: [phone]    F: [phone]

## 2024-08-17 NOTE — Progress Notes (Signed)
  NEUROSURGERY PROGRESS NOTE   I was contacted by Dr. Mavis regarding this patient's subdural hematoma. I have reviewed the history in the EMR and with Dr. Mavis. Briefly, the patient suffered multiple strokes in the past with July 2024 MRI demonstrating evidence of previous strokes and advances microvascular disease. He has presented with what sounds like generlized decline including multiple falls and subjective worsening of left-sided weakness. After evaluation by Dr. Mavis it was not felt that he required operative evacuation and he asked me to evaluate for the possibility of middle meningeal artery embolization. I have also reviewed his CT head and previous MRI. The left-sided subdural is chronic and quite small, with minimal associated mass effect. I agree with Dr. Mavis that this is very unlikely to be responsible for his symptoms and certainly does not require surgery at this point. Furthermore, with the small size of the cSDH, I think observation with repeat CT in 3-4 weeks is reasonable. I do not feel we need to proceed with MMA embolization at this point. It will remain an option should follow-up CT demonstrate progression. I also agree we should continue to hold ASA while his SDH is resolving.   Gerldine Maizes, MD [facility]

## 2024-08-17 NOTE — Progress Notes (Signed)
 Routine EEG completed, results pending Neurology review and interpretation

## 2024-08-17 NOTE — Progress Notes (Signed)
 PROGRESS NOTE    Richard Vaughn  FMW:[id number] DOB: 19-Aug-1951 DOA: 08/15/2024 PCP: [place], [name], MD     Brief Narrative:  Richard Vaughn  is a 73 y.o. male with medical history significant of hypertension, hyperlipidemia, CVA, spastic left-sided hemiplegia, PAD, carotid artery disease, GERD, Barrett's esophagus, depression, central sleep apnea, obstructive sleep apnea, chronic pain.   Patient presented to ED due to multiple complaints, per wife who is providing the history, it does appear over the last couple weeks has been having worsening left-sided weakness, unsteady gait, where he had couple falls, but no head trauma, well with these episodes of shakiness he has been having some tachycardia, diaphoresis, where he required to start using walker, as well he been having urinary incontinence over the last month.  In ED his workup was significant for left-sided subdural hematoma, with acute component and 2 mm rightward midline shift, neurosurgery was consulted.   New events last 24 hours / Subjective: Reviewed Dr. Mavis and Dr. Valeda notes, they are recommending conservative measures with repeat CT in 3 to 4 weeks and to hold aspirin .  Patient seen with wife at bedside.  She states that patient is fairly active at baseline.  There have been multiple concerning symptoms that preceded this hospitalization.  These include BPH symptoms for which he was started on Flomax .  Due to worsening symptoms, they discontinued Flomax  as outpatient.  She also then started noticing that patient had increase in diastolic BP.  He went to his PCPs office for follow-up.  He has then had increasing episodes of tachycardia and diaphoresis, without any pain, chest pain, dizziness.  His heart rate has been as high as 130s.  He has also been having some eversion of his feet/gait abnormality.   Assessment & Plan:  Principal Problem:   Subdural hematoma (HCC) Active Problems:   PAD (peripheral artery  disease)   Hyperlipidemia with target LDL less than 70   Chronic pain syndrome   Essential hypertension   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)   Systolic hypertension with cerebrovascular disease   Sleep apnea   Obesity, Class I, BMI 30-34.9   Left subdural hematoma - Appreciate neurosurgery - Currently planning for repeat CT in 3 to 4 weeks for monitoring - Hold aspirin   History of stroke with left-sided hemiparesis and chronic dysarthria - Hold aspirin  - PT OT - Statin  Hypertension - Norvasc , ARB   Episodes of tachycardia with diaphoresis - Has not had any episodes in the hospital thus far.  Ambulate and monitor vital signs and symptoms - TSH normal - Continue telemetry  BPH - Patient has stopped taking Flomax  as outpatient    DVT prophylaxis:  SCDs Start: 08/16/24 0751 SCDs Start: 08/16/24 0249  Code Status: Full code Family Communication: Spouse at bedside Disposition Plan: PT OT Status is: Inpatient Remains inpatient appropriate because: PT OT Pending    Antimicrobials:  Anti-infectives (From admission, onward)    None        Objective: Vitals:   08/17/24 0012 08/17/24 0347 08/17/24 0500 08/17/24 0900  BP: 117/81 (!) 135/90  (!) 130/103  Pulse: 74 73  79  Resp: 16 16  18   Temp: 98.4 F (36.9 C) 98.2 F (36.8 C)  98.2 F (36.8 C)  TempSrc: Oral Oral  Oral  SpO2: 96% 96%  97%  Weight:   110.4 kg   Height:        Intake/Output Summary (Last 24 hours) at  08/17/2024 1145 Last data filed at 08/17/2024 0511 Gross per 24 hour  Intake 1529.57 ml  Output 450 ml  Net 1079.57 ml   Filed Weights   08/15/24 1519 08/16/24 0500 08/17/24 0500  Weight: 111.7 kg 111.4 kg 110.4 kg    Examination:  General exam: Appears calm and comfortable  Respiratory system: Clear to auscultation. Respiratory effort normal. No respiratory distress. No conversational dyspnea.  Cardiovascular system: S1 & S2 heard, RRR.  No murmurs. No pedal edema. Gastrointestinal system: Abdomen is nondistended, soft and nontender Central nervous system: Alert and oriented. + Dysarthric speech which is chronic Extremities: Symmetric in appearance  Psychiatry: Judgement and insight appear normal. Mood & affect appropriate.   Data Reviewed: I have personally reviewed following labs and imaging studies  CBC: Recent Labs  Lab 08/15/24 1620 08/16/24 0330 08/17/24 0123  WBC 6.0 7.1 7.7  NEUTROABS 3.3 4.0  --   HGB 14.2 14.2 13.7  HCT 42.1 42.1 40.2  MCV 90.1 89.6 89.3  PLT 249 270 243   Basic Metabolic Panel: Recent Labs  Lab 08/15/24 1620 08/16/24 0330 08/17/24 0123  NA 137  --  138  K 4.2  --  4.0  CL 103  --  107  CO2 23  --  22  GLUCOSE 100*  --  98  BUN 10  --  10  CREATININE 0.98  --  1.00  CALCIUM  9.3  --  8.6*  MG  --  2.1  --    GFR: Estimated Creatinine Clearance: 92.3 mL/min (by C-G formula based on SCr of 1 mg/dL). Liver Function Tests: Recent Labs  Lab 08/15/24 1620  AST 19  ALT 21  ALKPHOS 210*  BILITOT 0.3  PROT 7.0  ALBUMIN 3.8   No results for input(s): LIPASE, AMYLASE in the last 168 hours. No results for input(s): AMMONIA in the last 168 hours. Coagulation Profile: Recent Labs  Lab 08/16/24 0330  INR 1.2   Cardiac Enzymes: No results for input(s): CKTOTAL, CKMB, CKMBINDEX, TROPONINI in the last 168 hours. BNP (last 3 results) No results for input(s): PROBNP in the last 8760 hours. HbA1C: No results for input(s): HGBA1C in the last 72 hours. CBG: No results for input(s): GLUCAP in the last 168 hours. Lipid Profile: No results for input(s): CHOL, HDL, LDLCALC, TRIG, CHOLHDL, LDLDIRECT in the last 72 hours. Thyroid  Function Tests: Recent Labs    08/15/24 1620  TSH 0.669   Anemia Panel: No results for input(s): VITAMINB12, FOLATE, FERRITIN, TIBC, IRON, RETICCTPCT in the last 72 hours. Sepsis Labs: No results for  input(s): PROCALCITON, LATICACIDVEN in the last 168 hours.  Recent Results (from the past 240 hours)  Surgical pcr screen     Status: None   Collection Time: 08/16/24  4:32 AM   Specimen: Nasal Mucosa; Nasal Swab  Result Value Ref Range Status   MRSA, PCR NEGATIVE NEGATIVE Final   Staphylococcus aureus NEGATIVE NEGATIVE Final    Comment: (NOTE) The Xpert SA Assay (FDA approved for NASAL specimens in patients 57 years of age and older), is one component of a comprehensive surveillance program. It is not intended to diagnose infection nor to guide or monitor treatment. Performed at [facility], [address], [place], [place] [address]       Radiology Studies: DG Chest 1 View Result Date: 08/15/2024 CLINICAL DATA:  cough EXAM: CHEST  1 VIEW COMPARISON:  Chest x-ray 05/04/2024 CT angio chest 05/04/2024 FINDINGS: The heart and mediastinal contours are unchanged.  Atherosclerotic plaque. No focal consolidation. Chronic coarsened interstitial markings with no overt pulmonary edema. No pleural effusion. No pneumothorax. No acute osseous abnormality. IMPRESSION: 1. No active disease. 2. Aortic Atherosclerosis (ICD10-I70.0) and Emphysema (ICD10-J43.9). Electronically Signed   By: Morgane  Naveau M.D.   On: 08/15/2024 17:06   CT Head Wo Contrast Result Date: 08/15/2024 CLINICAL DATA:  Provided history: Stroke, follow-up. EXAM: CT HEAD WITHOUT CONTRAST TECHNIQUE: Contiguous axial images were obtained from the base of the skull through the vertex without intravenous contrast. RADIATION DOSE REDUCTION: This exam was performed according to the departmental dose-optimization program which includes automated exposure control, adjustment of the mA and/or kV according to patient size and/or use of iterative reconstruction technique. COMPARISON:  Brain MRI 06/09/2023.  Head CT 06/08/2023. FINDINGS: Brain: Mild generalized cerebral atrophy. Subdural hematoma overlying the left cerebral hemisphere  measuring up to 9 mm in thickness (for instance as seen on series 9, image 24). The hematoma is predominantly hypodense. However, there are small-volume hyperdense components of acute hemorrhage within the collection. Mass effect upon the underlying left cerebral hemisphere with 2 mm rightward midline shift. Chronic lacunar infarcts within the left cerebral hemispheric white matter and within/about the bilateral deep gray nuclei, not appreciably changed from the prior MRI of 06/09/2023. Background advanced patchy and confluent hypoattenuation within the cerebral white matter, nonspecific but compatible with chronic small vessel ischemic disease. No acute demarcated cortical infarct. No evidence of an intracranial mass. Vascular: No hyperdense vessel.  Atherosclerotic calcifications. Skull: No calvarial fracture or aggressive osseous lesion. Sinuses/Orbits: No mass or acute finding within the imaged orbits. No significant paranasal sinus disease. Other: Small left mastoid effusion. Impression #1 called by telephone at the time of interpretation on 08/15/2024 at 5:03 pm to provider [facility] , who verbally acknowledged these results. IMPRESSION: 1. Mixed density subdural hematoma overlying the left cerebral hemisphere measuring up to 9 mm in thickness (containing small-volume acute components). Mass effect upon the underlying left cerebral hemisphere with 2 mm rightward midline shift. 2. Background parenchymal atrophy, chronic small vessel ischemic disease and unchanged chronic lacunar infarcts, as described. Electronically Signed   By: Rockey Childs D.O.   On: 08/15/2024 17:05      Scheduled Meds:  pantoprazole  (PROTONIX ) IV  40 mg Intravenous Q24H   Continuous Infusions:   LOS: 1 day   Time spent: 35 minutes   Delon Hoe, DO [facility] 08/17/2024, 11:45 AM   Available via Epic secure chat 7am-7pm After these hours, please refer to coverage provider listed on [email or web address]

## 2024-08-17 NOTE — Progress Notes (Signed)
 Subjective: The patient is alert and pleasant.  He has no complaints.  Objective: Vital signs in last 24 hours: Temp:  [97.9 F (36.6 C)-99.1 F (37.3 C)] 98.2 F (36.8 C) (10/03 0347) Pulse Rate:  [73-79] 73 (10/03 0347) Resp:  [16-19] 16 (10/03 0347) BP: (108-135)/(81-90) 135/90 (10/03 0347) SpO2:  [96 %-98 %] 96 % (10/03 0347) Weight:  [110.4 kg] 110.4 kg (10/03 0500) Estimated body mass index is 30.42 kg/m as calculated from the following:   Height as of this encounter: 6' 3 (1.905 m).   Weight as of this encounter: 110.4 kg.   Intake/Output from previous day: 10/02 0701 - 10/03 0700 In: 1529.6 [I.V.:1529.6] Out: 450 [Urine:450] Intake/Output this shift: Total I/O In: 1529.6 [I.V.:1529.6] Out: -   Physical exam the patient is alert and pleasant.  He is dysphasic and chronic left hemiparetic.  Lab Results: Recent Labs    08/16/24 0330 08/17/24 0123  WBC 7.1 7.7  HGB 14.2 13.7  HCT 42.1 40.2  PLT 270 243   BMET Recent Labs    08/15/24 1620 08/17/24 0123  NA 137 138  K 4.2 4.0  CL 103 107  CO2 23 22  GLUCOSE 100* 98  BUN 10 10  CREATININE 0.98 1.00  CALCIUM  9.3 8.6*    Studies/Results: DG Chest 1 View Result Date: 08/15/2024 CLINICAL DATA:  cough EXAM: CHEST  1 VIEW COMPARISON:  Chest x-ray 05/04/2024 CT angio chest 05/04/2024 FINDINGS: The heart and mediastinal contours are unchanged. Atherosclerotic plaque. No focal consolidation. Chronic coarsened interstitial markings with no overt pulmonary edema. No pleural effusion. No pneumothorax. No acute osseous abnormality. IMPRESSION: 1. No active disease. 2. Aortic Atherosclerosis (ICD10-I70.0) and Emphysema (ICD10-J43.9). Electronically Signed   By: Morgane  Naveau M.D.   On: 08/15/2024 17:06   CT Head Wo Contrast Result Date: 08/15/2024 CLINICAL DATA:  Provided history: Stroke, follow-up. EXAM: CT HEAD WITHOUT CONTRAST TECHNIQUE: Contiguous axial images were obtained from the base of the skull through the  vertex without intravenous contrast. RADIATION DOSE REDUCTION: This exam was performed according to the departmental dose-optimization program which includes automated exposure control, adjustment of the mA and/or kV according to patient size and/or use of iterative reconstruction technique. COMPARISON:  Brain MRI 06/09/2023.  Head CT 06/08/2023. FINDINGS: Brain: Mild generalized cerebral atrophy. Subdural hematoma overlying the left cerebral hemisphere measuring up to 9 mm in thickness (for instance as seen on series 9, image 24). The hematoma is predominantly hypodense. However, there are small-volume hyperdense components of acute hemorrhage within the collection. Mass effect upon the underlying left cerebral hemisphere with 2 mm rightward midline shift. Chronic lacunar infarcts within the left cerebral hemispheric white matter and within/about the bilateral deep gray nuclei, not appreciably changed from the prior MRI of 06/09/2023. Background advanced patchy and confluent hypoattenuation within the cerebral white matter, nonspecific but compatible with chronic small vessel ischemic disease. No acute demarcated cortical infarct. No evidence of an intracranial mass. Vascular: No hyperdense vessel.  Atherosclerotic calcifications. Skull: No calvarial fracture or aggressive osseous lesion. Sinuses/Orbits: No mass or acute finding within the imaged orbits. No significant paranasal sinus disease. Other: Small left mastoid effusion. Impression #1 called by telephone at the time of interpretation on 08/15/2024 at 5:03 pm to provider [facility] , who verbally acknowledged these results. IMPRESSION: 1. Mixed density subdural hematoma overlying the left cerebral hemisphere measuring up to 9 mm in thickness (containing small-volume acute components). Mass effect upon the underlying left cerebral hemisphere with 2 mm rightward midline  shift. 2. Background parenchymal atrophy, chronic small vessel ischemic disease and  unchanged chronic lacunar infarcts, as described. Electronically Signed   By: Rockey Childs D.O.   On: 08/15/2024 17:05    Assessment/Plan: Left subdural hematoma: The plan is for middle meningeal artery embolization by Dr. Lanis, to hopefully avoid the need for bur holes.  I answered all his questions.  LOS: 1 day     Richard Vaughn 08/17/2024, 6:49 AM     Patient ID: Richard Vaughn, male   DOB: 12-24-1950, 73 y.o.   MRN: [id number]

## 2024-08-17 NOTE — Evaluation (Addendum)
 Physical Therapy Evaluation Patient Details Name: Richard Vaughn MRN: [id number] DOB: 1951-10-18 Today's Date: 08/17/2024  History of Present Illness  73 y.o. male admitted to [facility]15/24 with worsening L sided weakness and unsteady gait. Workup significant for L sided SDH with acute component and 2mm R midline shift. PMhx: HTN, HLD, CVA, spastic left-sided hemiplegia, PAD, carotid artery disease, GERD, Barrett's esophagus, depression, central sleep apnea, OSA, chronic pain   Clinical Impression  Pt in bed upon arrival with wife present and agreeable to PT eval. Typically, pt is independent for mobility with no AD. In recent weeks, pt was needing assist to stand with wife close by to ambulate with SP cane or RW. Hx of multiple falls in the past 6 months. Pt presents close to baseline with residual L sided weakness, decreased attention to L UE, impaired balance/gait, and impaired safety awareness. Pt needed ModA for bed mobility and ModA to stand from recliner with use of RW. Able to then ambulate 80 ft with CGA and RW. Cues needed for safety and obstacle management with pt veering right/left and taking short and shuffling steps. Wife reports gait is at baseline. Wife feels comfortable providing assistance at home with ability to have two family members assist with stair negotiation. Recommending home with HHPT and 24/7 assist. Pt would benefit from acute skilled PT with current functional limitations listed below (see PT Problem List). Acute PT to follow.         If plan is discharge home, recommend the following: A lot of help with walking and/or transfers;A lot of help with bathing/dressing/bathroom;Assist for transportation;Help with stairs or ramp for entrance;Direct supervision/assist for medications management   Can travel by private vehicle    Yes    Equipment Recommendations None recommended by PT     Functional Status Assessment Patient has had a recent decline in their functional  status and demonstrates the ability to make significant improvements in function in a reasonable and predictable amount of time.     Precautions / Restrictions Precautions Precautions: Fall Recall of Precautions/Restrictions: Intact Restrictions Weight Bearing Restrictions Per Provider Order: No      Mobility  Bed Mobility Overal bed mobility: Needs Assistance Bed Mobility: Rolling, Sidelying to Sit Rolling: Min assist, Used rails Sidelying to sit: Mod assist, Used rails     General bed mobility comments: assist to complete moving LE's off EOB and to raise trunk. Step by step sequencing throughout    Transfers Overall transfer level: Needs assistance Equipment used: Rolling walker (2 wheels) Transfers: Sit to/from Stand Sit to Stand: Mod assist, +2 physical assistance, +2 safety/equipment    General transfer comment: Initially ModAx2 to stand from low EOB, progressed to ModAx1 from recliner with wife able to safely transfer pt with therapy providing supervision    Ambulation/Gait Ambulation/Gait assistance: Contact guard assist Gait Distance (Feet): 80 Feet Assistive device: Rolling walker (2 wheels) Gait Pattern/deviations: Step-through pattern, Decreased stride length, Drifts right/left, Trunk flexed, Shuffle, Narrow base of support Gait velocity: decr     General Gait Details: shuffling steps with narrow BOS and shortened step length. Tends to drift right/left with cues needed to avoid obstacles. Wife reports gait is at baseline     Balance Overall balance assessment: Needs assistance, History of Falls, Mild deficits observed, not formally tested Sitting-balance support: No upper extremity supported, Feet supported Sitting balance-Leahy Scale: Fair     Standing balance support: Bilateral upper extremity supported, During functional activity, Reliant on assistive device for balance Standing  balance-Leahy Scale: Poor Standing balance comment: reliant on UE and  external support        Pertinent Vitals/Pain Pain Assessment Pain Assessment: No/denies pain    Home Living Family/patient expects to be discharged to:: Private residence Living Arrangements: Spouse/significant other Available Help at Discharge: Family;Available 24 hours/day (mostly) Type of Home: House Home Access: Stairs to enter Entrance Stairs-Rails: Right;Left;Can reach both Entrance Stairs-Number of Steps: 1   Home Layout: Two level;Laundry or work area in basement;Able to live on main level with bedroom/bathroom Home Equipment: Agricultural consultant (2 wheels);Cane - single point;BSC/3in1;Shower seat      Prior Function Prior Level of Function : Needs assist;History of Falls (last six months)    Mobility Comments: typically ind with no AD, cane or RW for gait recently. Wife close by for steps. At least 4 falls ADLs Comments: normally Ind with no AD, recently wife assists with LB dressing, medication management, to get into the shower and to drive     Extremity/Trunk Assessment   Upper Extremity Assessment Upper Extremity Assessment: Defer to OT evaluation    Lower Extremity Assessment Lower Extremity Assessment: LLE deficits/detail LLE Deficits / Details: L hip flexion 4/5, L knee ext 5/5, Ankle DF 5/5 LLE Sensation: WNL LLE Coordination: decreased gross motor    Cervical / Trunk Assessment Cervical / Trunk Assessment: Normal  Communication   Communication Communication: Impaired Factors Affecting Communication: Reduced clarity of speech;Difficulty expressing self    Cognition Arousal: Alert Behavior During Therapy: WFL for tasks assessed/performed   PT - Cognitive impairments: Awareness, Attention, Initiation, Sequencing, Problem solving, Safety/Judgement    PT - Cognition Comments: decreased awareness of L side with cues for safety and obstacle negotiation Following commands: Impaired Following commands impaired: Only follows one step commands consistently,  Follows one step commands with increased time, Follows multi-step commands inconsistently     Cueing Cueing Techniques: Verbal cues     General Comments General comments (skin integrity, edema, etc.): Wife present and supportive during eval     PT Assessment Patient needs continued PT services  PT Problem List Decreased strength;Decreased activity tolerance;Decreased balance;Decreased mobility;Decreased range of motion;Decreased coordination;Decreased cognition;Decreased knowledge of use of DME;Decreased safety awareness       PT Treatment Interventions DME instruction;Gait training;Stair training;Functional mobility training;Therapeutic activities;Therapeutic exercise;Balance training;Neuromuscular re-education;Patient/family education    PT Goals (Current goals can be found in the Care Plan section)  Acute Rehab PT Goals Patient Stated Goal: to go home PT Goal Formulation: With patient/family Time For Goal Achievement: 08/31/24 Potential to Achieve Goals: Good    Frequency Min 2X/week        AM-PAC PT 6 Clicks Mobility  Outcome Measure Help needed turning from your back to your side while in a flat bed without using bedrails?: A Lot Help needed moving from lying on your back to sitting on the side of a flat bed without using bedrails?: A Lot Help needed moving to and from a bed to a chair (including a wheelchair)?: A Lot Help needed standing up from a chair using your arms (e.g., wheelchair or bedside chair)?: A Lot Help needed to walk in hospital room?: A Little Help needed climbing 3-5 steps with a railing? : A Lot 6 Click Score: 13    End of Session Equipment Utilized During Treatment: Gait belt Activity Tolerance: Patient tolerated treatment well Patient left: in chair;with call bell/phone within reach;with chair alarm set;with family/visitor present Nurse Communication: Mobility status PT Visit Diagnosis: Unsteadiness on feet (R26.81);Other abnormalities of  gait  and mobility (R26.89);Muscle weakness (generalized) (M62.81);History of falling (Z91.81)    Time: 8952-8876 PT Time Calculation (min) (ACUTE ONLY): 36 min   Charges:   PT Evaluation $PT Eval Moderate Complexity: 1 Mod   PT General Charges $$ ACUTE PT VISIT: 1 Visit        Kate ORN, PT, DPT Secure Chat Preferred  Rehab Office [phone]   [name] [name] 08/17/2024, 11:51 AM

## 2024-08-17 NOTE — Evaluation (Addendum)
 Clinical/Bedside Swallow Evaluation Patient Details  Name: Richard Vaughn MRN: [id number] Date of Birth: [birth year]  Today's Date: 08/17/2024 Time: SLP Start Time (ACUTE ONLY): 1345 SLP Stop Time (ACUTE ONLY): 1409 SLP Time Calculation (min) (ACUTE ONLY): 24 min  Past 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) 09/23/2015   Chronic back pain    CVA (cerebral vascular accident) (HCC) 09/22/2016   Depression    Gastritis    Mild   Gastroparesis    GERD (gastroesophageal reflux disease)    Headache    History of kidney stones    Hyperglycemia    Hypertension    ICH (intracerebral hemorrhage) (HCC) - R thalmic/PLIC d/t HTN 93/98/7980   Lacunar infarct, acute (HCC) 08/25/2015   Nephrolithiasis    Pneumonia    Covid pneumonia   Renal cyst    Sleep apnea    Stricture and stenosis of esophagus    Stroke (HCC) 07/2015   Stroke-like symptom 09/22/2016   Treatment-emergent central sleep apnea 10/25/2018   Vision abnormalities    Past Surgical History:  Past Surgical History:  Procedure Laterality Date   CERVICAL DISC SURGERY  2012   COLONOSCOPY     LUMBAR DISC SURGERY  2005, 2010   replaced L4 and L5   SHOULDER ARTHROSCOPY WITH ROTATOR CUFF REPAIR Left 08/14/2020   Procedure: SHOULDER ARTHROSCOPY WITH ROTATOR CUFF REPAIR, SUBACROMIAL DECOMPRESSION;  Surgeon: Dozier Soulier, MD;  Location: [facility];  Service: Orthopedics;  Laterality: Left;   UPPER GASTROINTESTINAL ENDOSCOPY     HPI:  Richard Vaughn  is a 73 y.o. male, with medical history significant of hypertension, hyperlipidemia, CVA, spastic left-sided hemiplegia, history of silent aspiration 11/2022 (on thickener, disliked it and discontinued d/t hydration concerns), PAD, carotid artery disease, GERD, Barrett's esophagus, depression, central sleep apnea, obstructive sleep apnea, chronic pain presenting with  worsening left-sided weakness past 2 weeks, unsteady gait, In ED his workup was significant for left-sided subdural hematoma, with acute component and 2 mm rightward midline shift.  BSE 05/05/24 baseline cough and cough after [facility]. Recommended reg/thin. MBS 05/07/24 silent aspiration of thin, cough not sufficient to expel, no aspiration with nectar but doubt that nectar would prevent aspiration. Discussed methods to decrease risk with wife, oral care, primarily drinking water, resuming EMST and Provale cup.    Assessment / Plan / Recommendation  Clinical Impression  Pt has a h/o dysphagia including silent aspiration, most recently recommended earlier this year to continue thin liquids with Provale cup. Pt and wife Richard Vaughn share that he quickly stopped using the Provale cup as he did not like it, but that he was doing EMST frequently (not so frequently anymore, but still using it sometimes). Note that CXR on admission was clear, and they deny any PNA since that admission in June. They also deny any acute changes in swallowing with this current admission. Pt consumed POs with SLP with an immediate cough x1 with graham cracker, otherwise with occasional delayed cough noted. Given his h/o silent aspiration, MBS is typically recommended to better assess oropharyngeal function. Discussed this as an option with pt and wife. He does not want to pursue MBS at this time, as he also would not want to consider thickened liquids or use of Provale cup again. Given that he has been doing well at home without infection, and that they believe he is at his baseline, pt politely declines  MBS or any f/u SLP acutely. Wife is in agreement. SLP reviewed strategies recommended from most recent testing and will s/o for now.   SLP Visit Diagnosis: Dysphagia, unspecified (R13.10)    Aspiration Risk       Diet Recommendation Regular;Thin liquid    Liquid Administration via: Cup Medication Administration: Whole meds with  puree Supervision: Staff to assist with self feeding Compensations: Slow rate;Small sips/bites;Clear throat intermittently;Hard cough after swallow;Follow solids with liquid Postural Changes: Seated upright at 90 degrees;Remain upright for at least 30 minutes after po intake    Other  Recommendations Oral Care Recommendations: Oral care QID     Assistance Recommended at Discharge    Functional Status Assessment Patient has not had a recent decline in their functional status  Frequency and Duration            Prognosis        Swallow Study   General HPI: Richard Vaughn  is a 73 y.o. male, with medical history significant of hypertension, hyperlipidemia, CVA, spastic left-sided hemiplegia, history of silent aspiration 11/2022 (on thickener, disliked it and discontinued d/t hydration concerns), PAD, carotid artery disease, GERD, Barrett's esophagus, depression, central sleep apnea, obstructive sleep apnea, chronic pain presenting with worsening left-sided weakness past 2 weeks, unsteady gait, In ED his workup was significant for left-sided subdural hematoma, with acute component and 2 mm rightward midline shift.  BSE 05/05/24 baseline cough and cough after [facility]. Recommended reg/thin. MBS 05/07/24 silent aspiration of thin, cough not sufficient to expel, no aspiration with nectar but doubt that nectar would prevent aspiration. Discussed methods to decrease risk with wife, oral care, primarily drinking water, resuming EMST and Provale cup. Type of Study: Bedside Swallow Evaluation Previous Swallow Assessment: see HPI Diet Prior to this Study: Regular;Thin liquids (Level 0) Temperature Spikes Noted: No Respiratory Status: Room air History of Recent Intubation: No Behavior/Cognition: Alert;Cooperative;Pleasant mood Oral Cavity Assessment: Within Functional Limits Oral Care Completed by SLP: No Oral Cavity - Dentition: Dentures, top;Dentures, bottom Self-Feeding Abilities: Total assist Patient  Positioning: Upright in bed Baseline Vocal Quality: Normal Volitional Cough: Strong;Congested Volitional Swallow: Able to elicit    Oral/Motor/Sensory Function Overall Oral Motor/Sensory Function: Within functional limits   Ice Chips Ice chips: Not tested   Thin Liquid Thin Liquid: Impaired Presentation: Cup Pharyngeal  Phase Impairments: Cough - Delayed    Nectar Thick Nectar Thick Liquid: Not tested   Honey Thick Honey Thick Liquid: Not tested   Puree Puree: Within functional limits Presentation: Spoon   Solid     Solid: Impaired Pharyngeal Phase Impairments: Cough - Immediate;Cough - Delayed      Leita SAILOR., M.A. CCC-SLP Acute Rehabilitation Services Office: [phone]  Secure chat preferred  08/17/2024,2:12 PM

## 2024-08-17 NOTE — Progress Notes (Addendum)
 SLP Cancellation Note  Patient Details Name: Richard Vaughn MRN: [id number] DOB: 22-Feb-1951   Cancelled treatment:       Reason Eval/Treat Not Completed: Other (comment) (Pt NPO for surgical intervention today) for meningeal artery embolization and is NPO. Will continue efforts.    UPDATE: Neurosurgery had decided not to proceed with surgical intervention. Swallow plans to be assessed this date.    [name] 08/17/2024, 9:00 AM

## 2024-08-18 DIAGNOSIS — S065XAA Traumatic subdural hemorrhage with loss of consciousness status unknown, initial encounter: Secondary | ICD-10-CM | POA: Diagnosis not present

## 2024-08-18 LAB — RPR: RPR Ser Ql: NONREACTIVE

## 2024-08-18 MED ORDER — MELATONIN 3 MG PO TABS
3.0000 mg | ORAL_TABLET | Freq: Every evening | ORAL | Status: DC | PRN
  Administered 2024-08-18: 3 mg via ORAL
  Filled 2024-08-18: qty 1

## 2024-08-18 NOTE — Plan of Care (Signed)

## 2024-08-18 NOTE — TOC Transition Note (Signed)
 Transition of Care [facility]) - Discharge Note   Patient Details  Name: [name] MRN: [id number] Date of Birth: 1951-02-06  [facility] CM/SW Contact:  Roxie KANDICE Stain, RN Phone Number: 08/18/2024, 11:28 AM   Clinical Narrative:    Richard Vaughn is stable to discharge home. Follow up apt on [facility]. Wife agreeable to use [facility] again. Burnard with [facility] accepted referral. Wife will transport home    Final next level of care: Home w Home Health Services Barriers to Discharge: Barriers Resolved   Patient Goals and CMS Choice Patient states their goals for this hospitalization and ongoing recovery are:: return home CMS Medicare.gov Compare Post Acute Care list provided to:: Patient Choice offered to / list presented to : Patient, Spouse      Discharge Placement                       Discharge Plan and Services Additional resources added to the After Visit Summary for     Discharge Planning Services: CM Consult                                 Social Drivers of Health (SDOH) Interventions SDOH Screenings   Food Insecurity: No Food Insecurity (08/16/2024)  Housing: Low Risk  (08/16/2024)  Transportation Needs: No Transportation Needs (08/16/2024)  Utilities: Not At Risk (08/16/2024)  Alcohol Screen: Low Risk  (04/27/2024)  Depression (PHQ2-9): Low Risk  (04/27/2024)  Financial Resource Strain: Low Risk  (04/27/2024)  Physical Activity: Sufficiently Active (04/27/2024)  Social Connections: Moderately Isolated (08/16/2024)  Stress: No Stress Concern Present (04/27/2024)  Tobacco Use: Medium Risk (08/16/2024)  Health Literacy: Adequate Health Literacy (04/27/2024)     Readmission Risk Interventions     No data to display

## 2024-08-18 NOTE — Plan of Care (Signed)

## 2024-08-18 NOTE — Discharge Summary (Signed)
 Physician Discharge Summary  Richard Vaughn FMW:[id number] DOB: 1951/09/23 DOA: 08/15/2024  PCP: [place], [name], MD  Admit date: 08/15/2024 Discharge date: 08/18/2024  Admitted From: Home Disposition:  Home with home health   Recommendations for Outpatient Follow-up:  Follow up with PCP  Follow up with [facility]  Follow up with [facility]   Discharge Condition: Stable CODE STATUS: Full  Diet recommendation:  Diet Orders (From admission, onward)     Start     Ordered   08/18/24 0000  Diet general        08/18/24 1059   08/17/24 1154  Diet regular Room service appropriate? Yes; Fluid consistency: Thin  Diet effective now       Question Answer Comment  Room service appropriate? Yes   Fluid consistency: Thin      08/17/24 1153            Brief/Interim Summary: [name]  is a 73 y.o. male with medical history significant of hypertension, hyperlipidemia, CVA, spastic left-sided hemiplegia, PAD, carotid artery disease, GERD, Barrett's esophagus, depression, central sleep apnea, obstructive sleep apnea, chronic pain.    Patient presented to ED due to multiple complaints, per wife who is providing the history, it does appear over the last couple weeks has been having worsening left-sided weakness, unsteady gait, where he had couple falls, but no head trauma, well with these episodes of shakiness he has been having some tachycardia, diaphoresis, where he required to start using walker, as well he been having urinary incontinence over the last month.   In ED his workup was significant for left-sided subdural hematoma, with acute component and 2 mm rightward midline shift, neurosurgery was consulted. After evaluation, they recommended conservative management with repeat imaging as outpatient.   Discharge Diagnoses:   Principal Problem:   Subdural hematoma (HCC) Active Problems:   PAD (peripheral artery disease)   Hyperlipidemia with target LDL  less than 70   Chronic pain syndrome   Essential hypertension   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)   Systolic hypertension with cerebrovascular disease   Sleep apnea   Obesity, Class I, BMI 30-34.9   Left subdural hematoma - Appreciate neurosurgery - Currently planning for repeat CT in 3 to 4 weeks for monitoring - Hold aspirin    History of stroke with left-sided hemiparesis and chronic dysarthria - Hold aspirin  - PT OT - Statin - Follow up with Dr. Rosemarie    Hypertension - Norvasc , ARB    Episodes of tachycardia with diaphoresis - Has not had any episodes in the hospital thus far.  Ambulate and monitor vital signs and symptoms - TSH normal - Continue telemetry   BPH - Patient has stopped taking Flomax  as outpatient      Discharge Instructions  Discharge Instructions     Call MD for:  difficulty breathing, headache or visual disturbances   Complete by: As directed    Call MD for:  extreme fatigue   Complete by: As directed    Call MD for:  persistant dizziness or light-headedness   Complete by: As directed    Call MD for:  persistant nausea and vomiting   Complete by: As directed    Call MD for:  severe uncontrolled pain   Complete by: As directed    Call MD for:  temperature >100.4   Complete by: As directed    Diet general   Complete by: As directed    Discharge instructions  Complete by: As directed    You were cared for by a hospitalist during your hospital stay. If you have any questions about your discharge medications or the care you received while you were in the hospital after you are discharged, you can call the unit and ask to speak with the hospitalist on call if the hospitalist that took care of you is not available. Once you are discharged, your primary care physician will handle any further medical issues. Please note that NO REFILLS for any discharge medications will be authorized once you are  discharged, as it is imperative that you return to your primary care physician (or establish a relationship with a primary care physician if you do not have one) for your aftercare needs so that they can reassess your need for medications and monitor your lab values.   Increase activity slowly   Complete by: As directed       Allergies as of 08/18/2024   No Known Allergies      Medication List     PAUSE taking these medications    aspirin  EC 81 MG tablet Wait to take this until your doctor or other care provider tells you to start again. Take 1 tablet (81 mg total) by mouth daily. Swallow whole.       TAKE these medications    amLODipine  2.5 MG tablet Commonly known as: NORVASC  TAKE 1 TABLET BY MOUTH AT BEDTIME.   docusate sodium  100 MG capsule Commonly known as: COLACE Take 100 mg by mouth at bedtime.   DULoxetine  60 MG capsule Commonly known as: CYMBALTA  TAKE 1 CAPSULE BY MOUTH EVERY DAY   esomeprazole  40 MG capsule Commonly known as: NEXIUM  TAKE 1 CAPSULE (40 MG TOTAL) BY MOUTH 2 (TWO) TIMES DAILY BEFORE A MEAL. What changed: when to take this   lovastatin  20 MG tablet Commonly known as: MEVACOR  TAKE 1 TABLET BY MOUTH EVERYDAY AT BEDTIME   Melatonin 10 MG Tabs Take 20 mg by mouth at bedtime.   methocarbamol 500 MG tablet Commonly known as: ROBAXIN Take 500 mg by mouth 3 (three) times daily as needed.   multivitamin with minerals Tabs tablet Take 1 tablet by mouth daily.   olmesartan  20 MG tablet Commonly known as: BENICAR  TAKE 1 TABLET BY MOUTH EVERY DAY What changed: when to take this   Oxycodone  HCl 10 MG Tabs Take 1 tablet (10 mg total) by mouth 2 (two) times daily.   traZODone  50 MG tablet Commonly known as: DESYREL  TAKE 1 TABLET BY MOUTH EVERYDAY AT BEDTIME        No Known Allergies  Consultations: Neurosurgery    Procedures/Studies: EEG adult Result Date: 08/17/2024 Shelton Arlin KIDD, MD     08/17/2024  4:08 PM Patient Name:  Richard Vaughn MRN: [id number] Epilepsy Attending: Arlin KIDD Shelton Referring Physician/Provider: Jennetta Gerard JONETTA, NP Date: 08/17/2024 Duration: 28.20 mins Patient history: 73yo M with ams. EEG to evaluate for seizure Level of alertness: Awake AEDs during EEG study: None Technical aspects: This EEG study was done with scalp electrodes positioned according to the 10-20 International system of electrode placement. Electrical activity was reviewed with band pass filter of 1-70Hz , sensitivity of 7 uV/mm, display speed of 50mm/sec with a 60Hz  notched filter applied as appropriate. EEG data were recorded continuously and digitally stored.  Video monitoring was available and reviewed as appropriate. Description: The posterior dominant rhythm consists of 9 Hz activity of moderate voltage (25-35 uV) seen predominantly in posterior head regions, symmetric  and reactive to eye opening and eye closing. Hyperventilation and photic stimulation were not performed.   IMPRESSION: This study is within normal limits. No seizures or epileptiform discharges were seen throughout the recording. A normal interictal EEG does not exclude the diagnosis of epilepsy. Arlin MALVA Krebs   DG Chest 1 View Result Date: 08/15/2024 CLINICAL DATA:  cough EXAM: CHEST  1 VIEW COMPARISON:  Chest x-ray 05/04/2024 CT angio chest 05/04/2024 FINDINGS: The heart and mediastinal contours are unchanged. Atherosclerotic plaque. No focal consolidation. Chronic coarsened interstitial markings with no overt pulmonary edema. No pleural effusion. No pneumothorax. No acute osseous abnormality. IMPRESSION: 1. No active disease. 2. Aortic Atherosclerosis (ICD10-I70.0) and Emphysema (ICD10-J43.9). Electronically Signed   By: Morgane  Naveau M.D.   On: 08/15/2024 17:06   CT Head Wo Contrast Result Date: 08/15/2024 CLINICAL DATA:  Provided history: Stroke, follow-up. EXAM: CT HEAD WITHOUT CONTRAST TECHNIQUE: Contiguous axial images were obtained from the base of the  skull through the vertex without intravenous contrast. RADIATION DOSE REDUCTION: This exam was performed according to the departmental dose-optimization program which includes automated exposure control, adjustment of the mA and/or kV according to patient size and/or use of iterative reconstruction technique. COMPARISON:  Brain MRI 06/09/2023.  Head CT 06/08/2023. FINDINGS: Brain: Mild generalized cerebral atrophy. Subdural hematoma overlying the left cerebral hemisphere measuring up to 9 mm in thickness (for instance as seen on series 9, image 24). The hematoma is predominantly hypodense. However, there are small-volume hyperdense components of acute hemorrhage within the collection. Mass effect upon the underlying left cerebral hemisphere with 2 mm rightward midline shift. Chronic lacunar infarcts within the left cerebral hemispheric white matter and within/about the bilateral deep gray nuclei, not appreciably changed from the prior MRI of 06/09/2023. Background advanced patchy and confluent hypoattenuation within the cerebral white matter, nonspecific but compatible with chronic small vessel ischemic disease. No acute demarcated cortical infarct. No evidence of an intracranial mass. Vascular: No hyperdense vessel.  Atherosclerotic calcifications. Skull: No calvarial fracture or aggressive osseous lesion. Sinuses/Orbits: No mass or acute finding within the imaged orbits. No significant paranasal sinus disease. Other: Small left mastoid effusion. Impression #1 called by telephone at the time of interpretation on 08/15/2024 at 5:03 pm to provider [facility] , who verbally acknowledged these results. IMPRESSION: 1. Mixed density subdural hematoma overlying the left cerebral hemisphere measuring up to 9 mm in thickness (containing small-volume acute components). Mass effect upon the underlying left cerebral hemisphere with 2 mm rightward midline shift. 2. Background parenchymal atrophy, chronic small vessel  ischemic disease and unchanged chronic lacunar infarcts, as described. Electronically Signed   By: Rockey Childs D.O.   On: 08/15/2024 17:05       Discharge Exam: Vitals:   08/18/24 0342 08/18/24 0745  BP: 121/89 (!) 138/93  Pulse: 78 75  Resp: 16 19  Temp: 98.2 F (36.8 C) 98.8 F (37.1 C)  SpO2: 97% 96%    General: Pt is alert, awake, not in acute distress Cardiovascular: RRR, S1/S2 +, no edema Respiratory: CTA bilaterally, no wheezing, no rhonchi, no respiratory distress, no conversational dyspnea  Abdominal: Soft, NT, ND, bowel sounds + Extremities: no edema, no cyanosis Psych: Normal mood and affect   The results of significant diagnostics from this hospitalization (including imaging, microbiology, ancillary and laboratory) are listed below for reference.     Microbiology: Recent Results (from the past 240 hours)  Surgical pcr screen     Status: None   Collection Time: 08/16/24  4:32  AM   Specimen: Nasal Mucosa; Nasal Swab  Result Value Ref Range Status   MRSA, PCR NEGATIVE NEGATIVE Final   Staphylococcus aureus NEGATIVE NEGATIVE Final    Comment: (NOTE) The Xpert SA Assay (FDA approved for NASAL specimens in patients 76 years of age and older), is one component of a comprehensive surveillance program. It is not intended to diagnose infection nor to guide or monitor treatment. Performed at [facility], [address] [place], [place] [address]      Labs: BNP (last 3 results) Recent Labs    05/04/24 0830  BNP 15.7   Basic Metabolic Panel: Recent Labs  Lab 08/15/24 1620 08/16/24 0330 08/17/24 0123  NA 137  --  138  K 4.2  --  4.0  CL 103  --  107  CO2 23  --  22  GLUCOSE 100*  --  98  BUN 10  --  10  CREATININE 0.98  --  1.00  CALCIUM  9.3  --  8.6*  MG  --  2.1  --    Liver Function Tests: Recent Labs  Lab 08/15/24 1620  AST 19  ALT 21  ALKPHOS 210*  BILITOT 0.3  PROT 7.0  ALBUMIN 3.8   No results for input(s): LIPASE,  AMYLASE in the last 168 hours. No results for input(s): AMMONIA in the last 168 hours. CBC: Recent Labs  Lab 08/15/24 1620 08/16/24 0330 08/17/24 0123 08/17/24 1739  WBC 6.0 7.1 7.7 7.0  NEUTROABS 3.3 4.0  --   --   HGB 14.2 14.2 13.7 14.1  HCT 42.1 42.1 40.2 41.8  MCV 90.1 89.6 89.3 90.5  PLT 249 270 243 254   Cardiac Enzymes: No results for input(s): CKTOTAL, CKMB, CKMBINDEX, TROPONINI in the last 168 hours. BNP: Invalid input(s): POCBNP CBG: No results for input(s): GLUCAP in the last 168 hours. D-Dimer No results for input(s): DDIMER in the last 72 hours. Hgb A1c No results for input(s): HGBA1C in the last 72 hours. Lipid Profile No results for input(s): CHOL, HDL, LDLCALC, TRIG, CHOLHDL, LDLDIRECT in the last 72 hours. Thyroid  function studies Recent Labs    08/17/24 1739  TSH 0.485   Anemia work up Recent Labs    08/17/24 1739  VITAMINB12 555  FOLATE 15.7   Urinalysis    Component Value Date/Time   COLORURINE DARK YELLOW 08/07/2024 1115   APPEARANCEUR CLEAR 08/07/2024 1115   LABSPEC 1.026 08/07/2024 1115   PHURINE < OR = 5.0 (A) 08/07/2024 1115   GLUCOSEU NEGATIVE 08/07/2024 1115   GLUCOSEU NEGATIVE 08/07/2013 1552   HGBUR NEGATIVE 08/07/2024 1115   BILIRUBINUR SMALL (A) 05/04/2024 0830   BILIRUBINUR n 04/22/2015 1117   KETONESUR NEGATIVE 08/07/2024 1115   PROTEINUR 1+ (A) 08/07/2024 1115   UROBILINOGEN 0.2 04/06/2022 1846   NITRITE NEGATIVE 05/04/2024 0830   LEUKOCYTESUR NEGATIVE 05/04/2024 0830   Sepsis Labs Recent Labs  Lab 08/15/24 1620 08/16/24 0330 08/17/24 0123 08/17/24 1739  WBC 6.0 7.1 7.7 7.0   Microbiology Recent Results (from the past 240 hours)  Surgical pcr screen     Status: None   Collection Time: 08/16/24  4:32 AM   Specimen: Nasal Mucosa; Nasal Swab  Result Value Ref Range Status   MRSA, PCR NEGATIVE NEGATIVE Final   Staphylococcus aureus NEGATIVE NEGATIVE Final    Comment: (NOTE) The  Xpert SA Assay (FDA approved for NASAL specimens in patients 70 years of age and older), is one component of a comprehensive surveillance  program. It is not intended to diagnose infection nor to guide or monitor treatment. Performed at [facility], [address] [place], [place] [address]      Patient was seen and examined on the day of discharge and was found to be in stable condition. Time coordinating discharge: 25 minutes including assessment and coordination of care, as well as examination of the patient.   SIGNED:  Delon Hoe, DO [facility] 08/18/2024, 10:59 AM

## 2024-08-18 NOTE — Progress Notes (Signed)
 Physical Therapy Treatment Patient Details Name: Richard Vaughn MRN: [id number] DOB: June 15, 1951 Today's Date: 08/18/2024   History of Present Illness 73 y.o. male admitted to [facility] 08/15/24 with worsening L sided weakness and unsteady gait. Workup significant for L sided SDH with acute component and 2mm R midline shift. PMhx: HTN, HLD, CVA, spastic left-sided hemiplegia, PAD, carotid artery disease, GERD, Barrett's esophagus, depression, central sleep apnea, OSA, chronic pain    PT Comments  Pt resting in bed on arrival, pleasant and motivated for session. Pt demonstrating continued progress towards acute goals. Pt able to come to sitting EOB with CGA for safety with no physical assist required. Pt continues to require increased assist, up to mod A, to come to standing with pt bracing Les at EOB to self steady on rise. Pt progressing gait distance slightly with CGA for safety with no overt LOB however continues to have some R drift with cues needed for obstacle negotiation. Pt up in chair at end of session and receptive to education on appropriate activity progression. Pt continues to benefit from skilled PT services to progress toward functional mobility goals.      If plan is discharge home, recommend the following: A lot of help with walking and/or transfers;A lot of help with bathing/dressing/bathroom;Assist for transportation;Help with stairs or ramp for entrance;Direct supervision/assist for medications management   Can travel by private vehicle        Equipment Recommendations  None recommended by PT    Recommendations for Other Services       Precautions / Restrictions Precautions Precautions: Fall Recall of Precautions/Restrictions: Intact Restrictions Weight Bearing Restrictions Per Provider Order: No     Mobility  Bed Mobility Overal bed mobility: Needs Assistance Bed Mobility: Supine to Sit     Supine to sit: Contact guard     General bed mobility comments: pt coming  to sit on R EOB without assist    Transfers Overall transfer level: Needs assistance Equipment used: Rolling walker (2 wheels) Transfers: Sit to/from Stand Sit to Stand: Mod assist           General transfer comment: pt standing with mod A, bracing LEs against EOB to self stedy on rise    Ambulation/Gait Ambulation/Gait assistance: Contact guard assist Gait Distance (Feet): 101 Feet Assistive device: Rolling walker (2 wheels) Gait Pattern/deviations: Step-through pattern, Decreased stride length, Drifts right/left, Trunk flexed, Shuffle, Narrow base of support Gait velocity: decr     General Gait Details: shuffling steps with narrow BOS and shortened step length. Drfiting R with cues to avoid obstacles, able to increase step height with cues however unable to maintain   Stairs             Wheelchair Mobility     Tilt Bed    Modified Rankin (Stroke Patients Only)       Balance Overall balance assessment: Needs assistance, History of Falls, Mild deficits observed, not formally tested Sitting-balance support: No upper extremity supported, Feet supported Sitting balance-Leahy Scale: Fair Sitting balance - Comments: seated EOB   Standing balance support: Bilateral upper extremity supported, During functional activity, Reliant on assistive device for balance Standing balance-Leahy Scale: Poor Standing balance comment: heavily reliant on RW for ambulation; able to maintain dynamic standing with CGA during standing ADLs at the sink.                            Communication Communication Communication: Impaired Factors Affecting Communication:  Reduced clarity of speech;Difficulty expressing self  Cognition Arousal: Alert Behavior During Therapy: Flat affect   PT - Cognitive impairments: Awareness, Attention, Initiation, Sequencing, Problem solving, Safety/Judgement                       PT - Cognition Comments: decreased awareness of L side  with cues for safety and obstacle negotiation Following commands: Intact      Cueing Cueing Techniques: Verbal cues  Exercises      General Comments General comments (skin integrity, edema, etc.): Pt spouse present and supportive      Pertinent Vitals/Pain Pain Assessment Pain Assessment: Faces Faces Pain Scale: No hurt Pain Intervention(s): Monitored during session    Home Living                          Prior Function            PT Goals (current goals can now be found in the care plan section) Acute Rehab PT Goals Patient Stated Goal: to go home PT Goal Formulation: With patient/family Time For Goal Achievement: 08/31/24 Progress towards PT goals: Progressing toward goals    Frequency    Min 2X/week      PT Plan      Co-evaluation              AM-PAC PT 6 Clicks Mobility   Outcome Measure  Help needed turning from your back to your side while in a flat bed without using bedrails?: A Little Help needed moving from lying on your back to sitting on the side of a flat bed without using bedrails?: A Little Help needed moving to and from a bed to a chair (including a wheelchair)?: A Lot Help needed standing up from a chair using your arms (e.g., wheelchair or bedside chair)?: A Lot Help needed to walk in hospital room?: A Little Help needed climbing 3-5 steps with a railing? : A Lot 6 Click Score: 15    End of Session Equipment Utilized During Treatment: Gait belt Activity Tolerance: Patient tolerated treatment well Patient left: in chair;with call bell/phone within reach;with chair alarm set;with family/visitor present Nurse Communication: Mobility status PT Visit Diagnosis: Unsteadiness on feet (R26.81);Other abnormalities of gait and mobility (R26.89);Muscle weakness (generalized) (M62.81);History of falling (Z91.81)     Time: 9096-9076 PT Time Calculation (min) (ACUTE ONLY): 20 min  Charges:    $Therapeutic Activity: 8-22  mins PT General Charges $$ ACUTE PT VISIT: 1 Visit                     Haelie Clapp R. PTA Acute Rehabilitation Services Office: [phone]   Therisa CHRISTELLA Boor 08/18/2024, 9:27 AM

## 2024-08-20 ENCOUNTER — Other Ambulatory Visit (HOSPITAL_COMMUNITY): Payer: Self-pay | Admitting: Student

## 2024-08-20 ENCOUNTER — Telehealth: Payer: Self-pay

## 2024-08-20 ENCOUNTER — Encounter: Payer: Self-pay | Admitting: Family Medicine

## 2024-08-20 DIAGNOSIS — S065XAA Traumatic subdural hemorrhage with loss of consciousness status unknown, initial encounter: Secondary | ICD-10-CM

## 2024-08-20 NOTE — Transitions of Care (Post Inpatient/ED Visit) (Signed)
   08/20/2024  Name: Richard DEGRAZIA MRN: [id number] DOB: 12-25-1950  Today's TOC FU Call Status: Today's TOC FU Call Status:: Unsuccessful Call (1st Attempt) Unsuccessful Call (1st Attempt) Date: 08/20/24  Attempted to reach the patient regarding the most recent Inpatient/ED visit.  Follow Up Plan: Additional outreach attempts will be made to reach the patient to complete the Transitions of Care (Post Inpatient/ED visit) call.   Alan Ee, RN, BSN, CEN [employer]- Transition of Care Team.  [facility] [phone]

## 2024-08-21 ENCOUNTER — Telehealth: Payer: Self-pay

## 2024-08-21 NOTE — Transitions of Care (Post Inpatient/ED Visit) (Signed)
   08/21/2024  Name: GERRAD WELKER MRN: [id number] DOB: [birth year]  Today's TOC FU Call Status: Today's TOC FU Call Status:: Unsuccessful Call (2nd Attempt) Unsuccessful Call (2nd Attempt) Date: 08/21/24  Attempted to reach the patient regarding the most recent Inpatient/ED visit.  Follow Up Plan: Additional outreach attempts will be made to reach the patient to complete the Transitions of Care (Post Inpatient/ED visit) call.   Alan Ee, RN, BSN, CEN [employer]- Transition of Care Team.  [facility] [phone]

## 2024-08-22 ENCOUNTER — Telehealth: Payer: Self-pay

## 2024-08-22 NOTE — Transitions of Care (Post Inpatient/ED Visit) (Signed)
 08/22/2024  Name: Richard Vaughn MRN: [id number] DOB: [birth year]  Today's TOC FU Call Status: Today's TOC FU Call Status:: Successful TOC FU Call Completed TOC FU Call Complete Date: 08/22/24 Patient's Name and Date of Birth confirmed.  Transition Care Management Follow-up Telephone Call How have you been since you were released from the hospital?: Better (feeling better) Any questions or concerns?: No  Items Reviewed: Did you receive and understand the discharge instructions provided?: Yes Medications obtained,verified, and reconciled?: Yes (Medications Reviewed) Any new allergies since your discharge?: No Dietary orders reviewed?: Yes Type of Diet Ordered:: regular Do you have support at home?: Yes People in Home [RPT]: spouse Name of Support/Comfort Primary Source: [name]  Medications Reviewed Today: Medications Reviewed Today     Reviewed by Rumalda Alan PENNER, RN (Registered Nurse) on 08/22/24 at 1135  Med List Status: <None>   Medication Order Taking? Sig Documenting Provider Last Dose Status Informant  amLODipine  (NORVASC ) 2.5 MG tablet [id number] Yes TAKE 1 TABLET BY MOUTH AT BEDTIME. [place], [name], MD  Active Self, Spouse/Significant Other, Pharmacy Records  aspirin  EC 81 MG tablet [id number]  Take 1 tablet (81 mg total) by mouth daily. Swallow whole.  Patient not taking: Reported on 08/22/2024   Whitfield [name], NP  Active Self, Spouse/Significant Other, Pharmacy Records  docusate sodium  (COLACE) 100 MG capsule [id number] Yes Take 100 mg by mouth at bedtime. [provider]  Active Self, Spouse/Significant Other, Pharmacy Records  DULoxetine  (CYMBALTA ) 60 MG capsule [id number] Yes TAKE 1 CAPSULE BY MOUTH EVERY DAY [place], [name], MD  Active Self, Spouse/Significant Other, Pharmacy Records  esomeprazole  (NEXIUM ) 40 MG capsule [id number] Yes TAKE 1 CAPSULE (40 MG TOTAL) BY MOUTH 2 (TWO) TIMES DAILY BEFORE A MEAL. Craig Alan SAUNDERS, PA-C  Active Self, Spouse/Significant  Other, Pharmacy Records  lovastatin  (MEVACOR ) 20 MG tablet [id number] Yes TAKE 1 TABLET BY MOUTH EVERYDAY AT BEDTIME [place], [name], MD  Active Self, Spouse/Significant Other, Pharmacy Records  Melatonin 10 MG TABS [id number] Yes Take 20 mg by mouth at bedtime. [provider]  Active Self, Spouse/Significant Other, Pharmacy Records  methocarbamol (ROBAXIN) 500 MG tablet [id number]  Take 500 mg by mouth 3 (three) times daily as needed.  Patient not taking: Reported on 08/22/2024   [provider]  Active Self, Spouse/Significant Other, Pharmacy Records  Multiple Vitamin (MULTIVITAMIN WITH MINERALS) TABS tablet [id number] Yes Take 1 tablet by mouth daily. [provider]  Active Self, Spouse/Significant Other, Pharmacy Records  olmesartan  (BENICAR ) 20 MG tablet [id number] Yes TAKE 1 TABLET BY MOUTH EVERY DAY [place], [name], MD  Active Self, Spouse/Significant Other, Pharmacy Records  Oxycodone  HCl 10 MG TABS [id number] Yes Take 1 tablet (10 mg total) by mouth 2 (two) times daily. [place], [name], MD  Active Self, Spouse/Significant Other, Pharmacy Records  traZODone  (DESYREL ) 50 MG tablet [id number] Yes TAKE 1 TABLET BY MOUTH EVERYDAY AT BEDTIME [place], [name], MD  Active Self, Spouse/Significant Other, Pharmacy Records            Home Care and Equipment/Supplies: Were Home Health Services Ordered?: Yes Name of Home Health Agency:: [facility] Has Agency set up a time to come to your home?: Yes First Home Health Visit Date: 08/20/24 Any new equipment or medical supplies ordered?: No  Functional Questionnaire: Do you need assistance with bathing/showering or dressing?: Yes (wife assist) Do you need assistance with meal preparation?: Yes (assist) Do you need assistance with eating?: No Do you have difficulty maintaining continence: Yes (  wearing pullups) Do you need assistance with getting out of bed/getting out of a chair/moving?: Yes (wife,  lift chair, rollator,  cane) Do you have difficulty managing or taking your medications?: No  Follow up appointments reviewed: PCP Follow-up appointment confirmed?: No (Wife will call today and schedule) MD Provider Line Number:[phone] Given: No Specialist Hospital Follow-up appointment confirmed?: Yes Date of Specialist follow-up appointment?: 08/31/24 Follow-Up Specialty Provider:: Neurosurgery Do you need transportation to your follow-up appointment?: No Do you understand care options if your condition(s) worsen?: Yes-patient verbalized understanding  SDOH Interventions Today    Flowsheet Row Most Recent Value  SDOH Interventions   Food Insecurity Interventions Intervention Not Indicated  Housing Interventions Intervention Not Indicated  Transportation Interventions Intervention Not Indicated  Utilities Interventions Intervention Not Indicated   Today's Vitals   08/22/24 1141  PainSc: 5     Goals Addressed             This Visit's Progress    [facility] Transitions of Care (TOC) Care Plan       Problems:  Recent Hospitalization for treatment of subdural hematoma No Hospital Follow Up Provider appointment - wife to call to schedule.  Goal:  Over the next 30 days, the patient will not experience hospital readmission  Interventions:  Transitions of Care: Doctor Visits  - discussed the importance of doctor visits Post discharge activity limitations prescribed by provider reviewed Reviewed all medications and ensured patient knows how to take his medications.  Reviewed importance of scheduling hospital follow up.  Reviewed safety precautions and discuss fall prevention Reviewed importance of recognizing signs of stroke. Reviewed signs of stroke and when to call 911.  Confirms aspirin  remains on hold Reviewed and offer 30 day TOC program and patient consented.  Scheduled next telephone call.  This note sent to PCP.   Patient Self Care Activities:  Attend all scheduled provider  appointments Call provider office for new concerns or questions  Notify RN Care Manager of TOC call rescheduling needs Participate in Transition of Care Program/Attend TOC scheduled calls Take medications as prescribed   Call PCP office and schedule hospital follow up.  Plan:  Telephone follow up appointment with care management team member scheduled for:  08/29/2024 at 1pm The patient has been provided with contact information for the care management team and has been advised to call with any health related questions or concerns.         Alan Ee, RN, BSN, CEN [employer]- Transition of Care Team.  [facility] [phone]

## 2024-08-22 NOTE — Telephone Encounter (Signed)
 Please advise.   Copied from CRM #[id number]. Topic: Clinical - Home Health Verbal Orders >> Aug 22, 2024  9:02 AM Aleatha BROCKS wrote: Caller/Agency: [facility] Callback Number: [phone] Service Requested: Physical Therapy Frequency: 2 week 2, 0 week 1, 1 week 6 Any new concerns about the patient? No

## 2024-08-23 NOTE — Telephone Encounter (Signed)
 Verbal authorization to requested services can be given. Thanks, BJ

## 2024-08-24 ENCOUNTER — Encounter: Payer: Self-pay | Admitting: Family Medicine

## 2024-08-24 ENCOUNTER — Ambulatory Visit (INDEPENDENT_AMBULATORY_CARE_PROVIDER_SITE_OTHER): Admitting: Family Medicine

## 2024-08-24 VITALS — BP 104/66 | HR 80 | Temp 98.1°F | Resp 16 | Ht 75.0 in | Wt 242.4 lb

## 2024-08-24 DIAGNOSIS — S065XAA Traumatic subdural hemorrhage with loss of consciousness status unknown, initial encounter: Secondary | ICD-10-CM

## 2024-08-24 DIAGNOSIS — I10 Essential (primary) hypertension: Secondary | ICD-10-CM

## 2024-08-24 DIAGNOSIS — I69354 Hemiplegia and hemiparesis following cerebral infarction affecting left non-dominant side: Secondary | ICD-10-CM

## 2024-08-24 DIAGNOSIS — R32 Unspecified urinary incontinence: Secondary | ICD-10-CM

## 2024-08-24 NOTE — Telephone Encounter (Signed)
 Verbal authorization given to representative.

## 2024-08-24 NOTE — Patient Instructions (Signed)
 A few things to remember from today's visit:  Stop Amlodipine . No changes in rest.  If you need refills for medications you take chronically, please call your pharmacy. Do not use My Chart to request refills or for acute issues that need immediate attention. If you send a my chart message, it may take a few days to be addressed, specially if I am not in the office.  Please be sure medication list is accurate. If a new problem present, please set up appointment sooner than planned today.

## 2024-08-24 NOTE — Assessment & Plan Note (Signed)
 Aspirin  has been held. Continue Lovastatin  20 mg daily. Has an appt with neurologist 09/25/24.

## 2024-08-24 NOTE — Assessment & Plan Note (Signed)
 Neurosurgeon consultation during recent hospitalization, conservative treatment was recommended. Aspirin  has been held. He has an appt with neurosurgeon 08/31/24. Has head CT scheduled for follow up. Instructed about warning signs.

## 2024-08-24 NOTE — Assessment & Plan Note (Signed)
 BP on lower normal range. Recommend stopping Amlodipine  2.5 mg for now. Continue Olmesartan  20 mg daily and low salt diet. Continue monitoring BP regularly. F/U in 6 weeks, before of needed.

## 2024-08-24 NOTE — Telephone Encounter (Signed)
 Seen today, will kep appt with neurosurgeon and neuro in 09/2024. BJ

## 2024-08-24 NOTE — Progress Notes (Signed)
 Chief Complaint  Patient presents with   Hospitalization Follow-up    Pt is here with spouse for hospital f/u    Discussed the use of AI scribe software for clinical note transcription with the patient, who gave verbal consent to proceed.  History of Present Illness Richard Vaughn is a 73 year old male with a PMHx significant for insomnia, chronic pain, HTN, OSA, GERD, HLD, CVA, and depression here today with his wife to follow on recent hospitalization. Hospitalized from 08/15/2024 to 08/18/2024. TOC call on 08/21/24 unsuccessful and 08/22/24.  He presented to the ED due to worsening left-sided weakness, unsteady gait, diaphoresis, and episodes of shakiness and tachycardia. In the ED workup was significant for left-sided subdural hematoma with acute component and 2 mm rightward midline shift, neurosurgeon was consulted. Conservative management was recommended. He has an appointment with neurologist on 09/25/2024 Head CT scheduled to be repeated in 3-4 weeks. Aspirin  was held.  He had a fall on 07/27/24 but no head trauma at that time. During hospitalization he did not have episodes of diaphoresis or tachycardia. EEG 08/17/24 normal.  Lab Results  Component Value Date   TSH 0.485 08/17/2024   Head CT wo contrast 08/15/24: 1. Mixed density subdural hematoma overlying the left cerebral hemisphere measuring up to 9 mm in thickness (containing small-volume acute components). Mass effect upon the underlying left cerebral hemisphere with 2 mm rightward midline shift. 2. Background parenchymal atrophy, chronic small vessel ischemic disease and unchanged chronic lacunar infarcts.  He has been experiencing urinary frequency and incontinence, not longer on Flomax .  According to wife, during hospitalization Richard Vaughn was recommended but his wife was concerned about possible side effects while out of town, urinary retention. He has an appt with urologist in 10/2024.  Constipation has improved after  using Miralax .   In general he is feeling better. He reported decreased appetite and fatigue, though he has been sleeping well. He is on Trazodone  50 mg at bedtime.  HTN: No changes in antihypertensive medications. He is on amlodipine  2.5 mg and olmesartan  20 mg.  He has been holding the amlodipine  when his blood pressure is below 100 mmHg x 1.  Most SBP's low 100's.  He has been using a walker at home. HH evaluation last week, PT and OT have been started.  Planning on trip to the beach.  Lab Results  Component Value Date   NA 138 08/17/2024   CL 107 08/17/2024   K 4.0 08/17/2024   CO2 22 08/17/2024   BUN 10 08/17/2024   CREATININE 1.00 08/17/2024   GFRNONAA >60 08/17/2024   CALCIUM  8.6 (L) 08/17/2024   ALBUMIN 3.8 08/15/2024   GLUCOSE 98 08/17/2024   Lab Results  Component Value Date   WBC 7.0 08/17/2024   HGB 14.1 08/17/2024   HCT 41.8 08/17/2024   MCV 90.5 08/17/2024   PLT 254 08/17/2024   Review of Systems  Constitutional:  Positive for appetite change and fatigue. Negative for activity change, chills and fever.  HENT:  Negative for mouth sores and sore throat.   Respiratory:  Negative for cough and shortness of breath.   Cardiovascular:  Negative for chest pain, palpitations and leg swelling.  Gastrointestinal:  Negative for abdominal pain, nausea and vomiting.  Genitourinary:  Negative for decreased urine volume, dysuria and hematuria.  Skin:  Negative for rash.  Neurological:  Negative for syncope and headaches.  Psychiatric/Behavioral:  Negative for confusion and hallucinations.   See other pertinent positives and negatives  in HPI.  Current Outpatient Medications on File Prior to Visit  Medication Sig Dispense Refill   docusate sodium  (COLACE) 100 MG capsule Take 100 mg by mouth at bedtime.     DULoxetine  (CYMBALTA ) 60 MG capsule TAKE 1 CAPSULE BY MOUTH EVERY DAY 90 capsule 3   esomeprazole  (NEXIUM ) 40 MG capsule TAKE 1 CAPSULE (40 MG TOTAL) BY MOUTH 2  (TWO) TIMES DAILY BEFORE A MEAL. 180 capsule 1   lovastatin  (MEVACOR ) 20 MG tablet TAKE 1 TABLET BY MOUTH EVERYDAY AT BEDTIME 90 tablet 1   Melatonin 10 MG TABS Take 20 mg by mouth at bedtime.     Multiple Vitamin (MULTIVITAMIN WITH MINERALS) TABS tablet Take 1 tablet by mouth daily.     olmesartan  (BENICAR ) 20 MG tablet TAKE 1 TABLET BY MOUTH EVERY DAY 90 tablet 3   Oxycodone  HCl 10 MG TABS Take 1 tablet (10 mg total) by mouth 2 (two) times daily. 60 tablet 0   traZODone  (DESYREL ) 50 MG tablet TAKE 1 TABLET BY MOUTH EVERYDAY AT BEDTIME 90 tablet 2   [Paused] aspirin  EC 81 MG tablet Take 1 tablet (81 mg total) by mouth daily. Swallow whole. (Patient not taking: Reported on 08/24/2024) 30 tablet 11   methocarbamol (ROBAXIN) 500 MG tablet Take 500 mg by mouth 3 (three) times daily as needed. (Patient not taking: Reported on 08/24/2024)     No current facility-administered medications on file prior to visit.    Past Medical History:  Diagnosis Date   Barrett's esophagus    Cerebrovascular accident (CVA) due to thrombosis of left carotid artery (HCC) 09/23/2015   Chronic back pain    CVA (cerebral vascular accident) (HCC) 09/22/2016   Depression    Gastritis    Mild   Gastroparesis    GERD (gastroesophageal reflux disease)    Headache    History of kidney stones    Hyperglycemia    Hypertension    ICH (intracerebral hemorrhage) (HCC) - R thalmic/PLIC d/t HTN 93/98/7980   Lacunar infarct, acute (HCC) 08/25/2015   Nephrolithiasis    Pneumonia    Covid pneumonia   Renal cyst    Sleep apnea    Stricture and stenosis of esophagus    Stroke (HCC) 07/2015   Stroke-like symptom 09/22/2016   Treatment-emergent central sleep apnea 10/25/2018   Vision abnormalities    No Known Allergies  Social History   Socioeconomic History   Marital status: Married    Spouse name: Richard Vaughn   Number of children: 3   Years of education: Not on file   Highest education level: Some college, no  degree  Occupational History   Occupation: retired    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[employer]  Tobacco Use   Smoking status: Former    Current packs/day: 0.00    Average packs/day: 0.5 packs/day for 30.0 years (15.0 ttl pk-yrs)    Types: Cigarettes    Start date: 04/15/1988    Quit date: 04/15/2018    Years since quitting: 6.3   Smokeless tobacco: Never  Vaping Use   Vaping status: Never Used  Substance and Sexual Activity   Alcohol use: No    Alcohol/week: 0.0 standard drinks of alcohol   Drug use: Never    Comment: last use 04/2018   Sexual activity: Yes  Other Topics Concern   Not on file  Social History Narrative   Not on file   Social Drivers of Health   Financial Resource Strain: Low Risk  (04/27/2024)  Overall Financial Resource Strain (CARDIA)    Difficulty of Paying Living Expenses: Not hard at all  Food Insecurity: No Food Insecurity (08/22/2024)   Hunger Vital Sign    Worried About Running Out of Food in the Last Year: Never true    Ran Out of Food in the Last Year: Never true  Transportation Needs: No Transportation Needs (08/22/2024)   PRAPARE - Administrator, Civil Service (Medical): No    Lack of Transportation (Non-Medical): No  Physical Activity: Sufficiently Active (04/27/2024)   Exercise Vital Sign    Days of Exercise per Week: 5 days    Minutes of Exercise per Session: 60 min  Stress: No Stress Concern Present (04/27/2024)   [employer] of Occupational Health - Occupational Stress Questionnaire    Feeling of Stress: Not at all  Social Connections: Moderately Isolated (08/16/2024)   Social Connection and Isolation Panel    Frequency of Communication with Friends and Family: More than three times a week    Frequency of Social Gatherings with Friends and Family: More than three times a week    Attends Religious Services: Never    Database administrator or Organizations: No    Attends Banker Meetings: Never    Marital Status: Married     Vitals:   08/24/24 1118  BP: 104/66  Pulse: 80  Resp: 16  Temp: 98.1 F (36.7 C)  SpO2: 97%   Body mass index is 30.3 kg/m.  Physical Exam Vitals and nursing note reviewed.  Constitutional:      General: He is not in acute distress.    Appearance: He is well-developed.  HENT:     Head: Normocephalic and atraumatic.     Mouth/Throat:     Mouth: Mucous membranes are moist.  Eyes:     Conjunctiva/sclera: Conjunctivae normal.  Cardiovascular:     Rate and Rhythm: Normal rate and regular rhythm.     Heart sounds: No murmur heard.    Comments: PT pulses palpable. Pulmonary:     Effort: Pulmonary effort is normal. No respiratory distress.     Breath sounds: Normal breath sounds. Transmitted upper airway sounds (cleared after coughing) present.  Abdominal:     Palpations: Abdomen is soft. There is no mass.     Tenderness: There is no abdominal tenderness.  Musculoskeletal:     Right lower leg: No edema.     Left lower leg: No edema.  Skin:    General: Skin is warm.     Findings: No erythema or rash.  Neurological:     Mental Status: He is alert. Mental status is at baseline.     Comments: Left-sided weakness and slurred speech residual from CVA. In a wheel chair today.  Psychiatric:        Mood and Affect: Mood and affect normal.   ASSESSMENT AND PLAN:  [name] was seen today for hospitalization follow-up.  Diagnoses and all orders for this visit:  Subdural hematoma [facility]) Assessment & Plan: Neurosurgeon consultation during recent hospitalization, conservative treatment was recommended. Aspirin  has been held. He has an appt with neurosurgeon 08/31/24. Has head CT scheduled for follow up. Instructed about warning signs.  Hemiparesis affecting left side as late effect of cerebrovascular accident [facility]) Assessment & Plan: Aspirin  has been held. Continue Lovastatin  20 mg daily. Has an appt with neurologist 09/25/24.  Essential  hypertension Assessment & Plan: BP on lower normal range. Recommend stopping Amlodipine  2.5 mg for now.  Continue Olmesartan  20 mg daily and low salt diet. Continue monitoring BP regularly. F/U in 6 weeks, before of needed.  Urinary incontinence, unspecified type He is not longer on Flomax . Richard Vaughn was mentioned during recent hospitalization but his wife prefers to hold for now until he completely recovers from issues that prompted recent hospitalization. He has an appt with urologist.  -He has an incentive spirometer at home, recommend using it several times per day.  I personally spent a total of 43 minutes in the care of the patient today including preparing to see the patient, getting/reviewing separately obtained history, performing a medically appropriate exam/evaluation, counseling and educating, and documenting clinical information in the EHR.  Return in about 6 weeks (around 10/05/2024).  Richard Newsham G. [place], MD  [facility]. [place] office.

## 2024-08-28 ENCOUNTER — Ambulatory Visit (HOSPITAL_BASED_OUTPATIENT_CLINIC_OR_DEPARTMENT_OTHER)
Admission: RE | Admit: 2024-08-28 | Discharge: 2024-08-28 | Disposition: A | Discharge status: Home / Self Care | Source: Ambulatory Visit | Attending: Student | Admitting: Student

## 2024-08-28 DIAGNOSIS — S065XAA Traumatic subdural hemorrhage with loss of consciousness status unknown, initial encounter: Secondary | ICD-10-CM | POA: Diagnosis present

## 2024-08-29 ENCOUNTER — Other Ambulatory Visit: Payer: Self-pay

## 2024-08-29 ENCOUNTER — Telehealth: Payer: Self-pay

## 2024-08-29 NOTE — Telephone Encounter (Signed)
 Left voicemail for representative to return my call.

## 2024-08-29 NOTE — Telephone Encounter (Signed)
 Verbal authorization to requested services can be given. Thanks, BJ

## 2024-08-29 NOTE — Transitions of Care (Post Inpatient/ED Visit) (Cosign Needed)
 Transition of Care week 2  Visit Note  08/29/2024  Name: Richard Vaughn MRN: [id number]          DOB: 08/06/1951  Situation: Patient enrolled in [facility] 30-day program. Visit completed with patients wife by telephone.   Background:   Initial Transition Care Management Follow-up Telephone Call Discharge Date and Diagnosis: 08/18/24, Subdural hematoma   Past Medical History:  Diagnosis Date   Barrett's esophagus    Cerebrovascular accident (CVA) due to thrombosis of left carotid artery (HCC) 09/23/2015   Chronic back pain    CVA (cerebral vascular accident) (HCC) 09/22/2016   Depression    Gastritis    Mild   Gastroparesis    GERD (gastroesophageal reflux disease)    Headache    History of kidney stones    Hyperglycemia    Hypertension    ICH (intracerebral hemorrhage) (HCC) - R thalmic/PLIC d/t HTN 93/98/7980   Lacunar infarct, acute (HCC) 08/25/2015   Nephrolithiasis    Pneumonia    Covid pneumonia   Renal cyst    Sleep apnea    Stricture and stenosis of esophagus    Stroke (HCC) 07/2015   Stroke-like symptom 09/22/2016   Treatment-emergent central sleep apnea 10/25/2018   Vision abnormalities     Assessment: spoke with wife of patient who reports that patient is in a procedure for a kyoplasty of back. Had a CT yesterday and wife has read results. Patient to see neurosurgeon tomorrow. Then wife reports they are going out of town.  Patient Reported Symptoms: Cognitive Cognitive Status: Unable to Assess (patient in a procedure.)      Neurological Neurological Review of Symptoms: No symptoms reported (wife denies any changes in mental status.)    HEENT HEENT Symptoms Reported: Not assessed      Cardiovascular Cardiovascular Symptoms Reported: Not assessed    Respiratory Respiratory Symptoms Reported: Not assesed    Endocrine Endocrine Symptoms Reported: Not assessed    Gastrointestinal Gastrointestinal Symptoms Reported: Not assessed      Genitourinary  Genitourinary Symptoms Reported: Not assessed    Integumentary Integumentary Symptoms Reported: Not assessed    Musculoskeletal Musculoskelatal Symptoms Reviewed: Other Other Musculoskeletal Symptoms: per wife patient is currently in a kyoplasty procedure.        Psychosocial Psychosocial Symptoms Reported: Not assessed         There were no vitals filed for this visit.  Medications Reviewed Today     Reviewed by Rumalda Alan PENNER, RN (Registered Nurse) on 08/29/24 at 1327  Med List Status: <None>   Medication Order Taking? Sig Documenting Provider Last Dose Status Informant  aspirin  EC 81 MG tablet [id number] Yes Take 1 tablet (81 mg total) by mouth daily. Swallow whole. Whitfield [name], NP  Active Self, Spouse/Significant Other, Pharmacy Records  docusate sodium  (COLACE) 100 MG capsule [id number] Yes Take 100 mg by mouth at bedtime. [provider]  Active Self, Spouse/Significant Other, Pharmacy Records  DULoxetine  (CYMBALTA ) 60 MG capsule [id number] Yes TAKE 1 CAPSULE BY MOUTH EVERY DAY [place], [name], MD  Active Self, Spouse/Significant Other, Pharmacy Records  esomeprazole  (NEXIUM ) 40 MG capsule [id number] Yes TAKE 1 CAPSULE (40 MG TOTAL) BY MOUTH 2 (TWO) TIMES DAILY BEFORE A MEAL. Craig Alan SAUNDERS, PA-C  Active Self, Spouse/Significant Other, Pharmacy Records  lovastatin  (MEVACOR ) 20 MG tablet [id number] Yes TAKE 1 TABLET BY MOUTH EVERYDAY AT BEDTIME [place], [name], MD  Active Self, Spouse/Significant Other, Pharmacy Records  Melatonin 10 MG TABS [id number] Yes Take 20 mg  by mouth at bedtime. [provider]  Active Self, Spouse/Significant Other, Pharmacy Records  methocarbamol (ROBAXIN) 500 MG tablet [id number] Yes Take 500 mg by mouth 3 (three) times daily as needed. [provider]  Active Self, Spouse/Significant Other, Pharmacy Records  Multiple Vitamin (MULTIVITAMIN WITH MINERALS) TABS tablet [id number] Yes Take 1 tablet by mouth daily. [provider]  Active Self, Spouse/Significant Other, Pharmacy Records  olmesartan  (BENICAR ) 20 MG tablet [id number] Yes TAKE 1 TABLET BY MOUTH EVERY DAY [place], [name], MD  Active Self, Spouse/Significant Other, Pharmacy Records  Oxycodone  HCl 10 MG TABS [id number] Yes Take 1 tablet (10 mg total) by mouth 2 (two) times daily. [place], [name], MD  Active Self, Spouse/Significant Other, Pharmacy Records  traZODone  (DESYREL ) 50 MG tablet [id number] Yes TAKE 1 TABLET BY MOUTH EVERYDAY AT BEDTIME [place], [name], MD  Active Self, Spouse/Significant Other, Pharmacy Records            Goals       Increase physical activity (pt-stated)      Increase physical activity (pt-stated)      Remain active.      Patient stated (pt-stated)      I want to get back to normal.      Quit smoking / using tobacco      [facility] Transitions of Care (TOC) Care Plan      Problems:  Recent Hospitalization for treatment of subdural hematoma  08/29/2024 Discussed with wife that patient has had a new CT yesterday and she reports that she has read the results and will discuss with neuro surgery tomorrow. Patient is currently having a kyloplasty of his back at this time.  Wife reports no changes in mental status.  No Hospital Follow Up Provider appointment - wife to call to schedule.  08/29/2024 completed  Goal:  Over the next 30 days, the patient will not experience hospital readmission  Interventions:  Transitions of Care: Doctor Visits  - discussed the importance of doctor visits Post discharge activity limitations prescribed by provider reviewed Reviewed all medications and ensured patient knows how to take his medications.  Confirmed patient seeing Neurosurgeon tomorrow Assessed mental status with wife.  Reviewed safety precautions and discuss fall prevention Reviewed importance of recognizing signs of stroke. Reviewed signs of stroke and when to call 911.  Confirms aspirin  remains on hold Scheduled next  telephone call.  Patient Self Care Activities:  Attend all scheduled provider appointments Call provider office for new concerns or questions  Notify RN Care Manager of [facility] call rescheduling needs Participate in Transition of Care Program/Attend TOC scheduled calls Take medications as prescribed     Plan:  Telephone follow up appointment with care management team member scheduled for:  09/04/2024  2pm The patient has been provided with contact information for the care management team and has been advised to call with any health related questions or concerns.       Limited call and limited assessment due to patient being in a procedure.  Recommendation:   Continue Current Plan of Care  Follow Up Plan:   Telephone follow up appointment date/time:  09/04/2024 at 2pm  Alan Ee, RN, BSN, CEN Population Health- Transition of Care Team.  [facility] [phone]

## 2024-08-29 NOTE — Patient Instructions (Signed)
 Visit Information  Thank you for taking time to visit with me today. Please don't hesitate to contact me if I can be of assistance to you before our next scheduled telephone appointment.  Our next appointment is by telephone on 09/04/2024 at 2pm  Following is a copy of your care plan:   Goals Addressed             This Visit's Progress    [facility] Transitions of Care (TOC) Care Plan       Problems:  Recent Hospitalization for treatment of subdural hematoma  08/29/2024 Discussed with wife that patient has had a new CT yesterday and she reports that she has read the results and will discuss with neuro surgery tomorrow. Patient is currently having a kyloplasty of his back at this time.  Wife reports no changes in mental status.  No Hospital Follow Up Provider appointment - wife to call to schedule.  08/29/2024 completed  Goal:  Over the next 30 days, the patient will not experience hospital readmission  Interventions:  Transitions of Care: Doctor Visits  - discussed the importance of doctor visits Post discharge activity limitations prescribed by provider reviewed Reviewed all medications and ensured patient knows how to take his medications.  Confirmed patient seeing Neurosurgeon tomorrow Assessed mental status with wife.  Reviewed safety precautions and discuss fall prevention Reviewed importance of recognizing signs of stroke. Reviewed signs of stroke and when to call 911.  Confirms aspirin  remains on hold Scheduled next telephone call.  Patient Self Care Activities:  Attend all scheduled provider appointments Call provider office for new concerns or questions  Notify RN Care Manager of [facility] call rescheduling needs Participate in Transition of Care Program/Attend TOC scheduled calls Take medications as prescribed     Plan:  Telephone follow up appointment with care management team member scheduled for:  09/04/2024  2pm The patient has been provided with contact information for  the care management team and has been advised to call with any health related questions or concerns.         Patient verbalizes understanding of instructions and care plan provided today and agrees to view in MyChart. Active MyChart status and patient understanding of how to access instructions and care plan via MyChart confirmed with patient.     Telephone follow up appointment with care management team member scheduled for:09/04/2024 2pm  Please call the care guide team at [phone] if you need to cancel or reschedule your appointment.   Please call the Suicide and Crisis Lifeline: 988 call the [place]  National Suicide Prevention Lifeline: 325-790-6984 or TTY: (831) 799-8328 TTY 310 402 9526) to talk to a trained counselor call 1-800-273-TALK (toll free, 24 hour hotline) call 911 if you are experiencing a Mental Health or Behavioral Health Crisis or need someone to talk to.  Alan Ee, RN, BSN, CEN [employer]- Transition of Care Team.  [facility] [phone]

## 2024-08-29 NOTE — Telephone Encounter (Signed)
 Copied from CRM #[id number]. Topic: Clinical - Home Health Verbal Orders >> Aug 29, 2024 11:19 AM Rea BROCKS wrote: Caller/Agency: Signe Bell/[facility]  Callback Number: [phone] Service Requested: Occupational Therapy  Frequency: 2x for one month  Any new concerns about the patient?

## 2024-08-31 ENCOUNTER — Other Ambulatory Visit: Payer: Self-pay | Admitting: Neurosurgery

## 2024-08-31 DIAGNOSIS — S065XAA Traumatic subdural hemorrhage with loss of consciousness status unknown, initial encounter: Secondary | ICD-10-CM

## 2024-09-03 NOTE — Telephone Encounter (Signed)
 Attempted to call representative for OT request. Left detailed authorization on voicemail and call back number.

## 2024-09-04 ENCOUNTER — Other Ambulatory Visit: Payer: Self-pay

## 2024-09-04 NOTE — Patient Instructions (Signed)
 Visit Information  Thank you for taking time to visit with me today. Please don't hesitate to contact me if I can be of assistance to you before our next scheduled telephone appointment.  Our next appointment is by telephone on 09/12/2024 at 2pm  Following is a copy of your care plan:   Goals Addressed             This Visit's Progress    [facility] Transitions of Care (TOC) Care Plan       Problems:  Recent Hospitalization for treatment of subdural hematoma  08/29/2024 Discussed with wife that patient has had a new CT yesterday and she reports that she has read the results and will discuss with neuro surgery tomorrow. Patient is currently having a kyloplasty of his back at this time.  Wife reports no changes in mental status. 09/04/2024 symptoms are resolving.  No more back pain, walking is better per wife,  no headaches.   BP per wife is within normal limits. Continues to hold medication for BP. Patient reports that his appetite is better. They are following swallowing precautions. Wife thinks patient is much improved. They are on vacation! No Hospital Follow Up Provider appointment - wife to call to schedule.  08/29/2024 completed  Goal:  Over the next 30 days, the patient will not experience hospital readmission  Interventions:  Transitions of Care: Doctor Visits  - discussed the importance of doctor visits Post discharge activity limitations prescribed by provider reviewed Reviewed all medications and ensured patient knows how to take his medications.  Assessed mental status with wife.  Reviewed safety precautions and discuss fall prevention Reviewed importance of recognizing signs of stroke. Reviewed signs of stroke and when to call 911.  Confirms aspirin  remains on hold Scheduled next telephone call.  Patient Self Care Activities:  Attend all scheduled provider appointments Call provider office for new concerns or questions  Notify RN Care Manager of TOC call rescheduling  needs Participate in Transition of Care Program/Attend TOC scheduled calls Take medications as prescribed   Have fun on your vacation.   Plan:  Telephone follow up appointment with care management team member scheduled for:  09/12/2024  2pm The patient has been provided with contact information for the care management team and has been advised to call with any health related questions or concerns.         Patient verbalizes understanding of instructions and care plan provided today and agrees to view in MyChart. Active MyChart status and patient understanding of how to access instructions and care plan via MyChart confirmed with patient.     Telephone follow up appointment with care management team member scheduled for:  Please call the care guide team at [phone] if you need to cancel or reschedule your appointment.   Please call the Suicide and Crisis Lifeline: 988 call the [place]  National Suicide Prevention Lifeline: 508-579-6733 or TTY: 718-139-0563 TTY 405-126-0594) to talk to a trained counselor call 1-800-273-TALK (toll free, 24 hour hotline) call 911 if you are experiencing a Mental Health or Behavioral Health Crisis or need someone to talk to.  Alan Ee, RN, BSN, CEN [employer]- Transition of Care Team.  [facility] [phone]

## 2024-09-04 NOTE — Transitions of Care (Post Inpatient/ED Visit) (Signed)
 Transition of Care week 3  Visit Note  09/04/2024  Name: Richard Vaughn MRN: [id number]          DOB: 30-Nov-1950  Situation: Patient enrolled in [facility] 30-day program. Visit completed with patient and wife jointly by telephone.   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 No new problems or complaints.  Patient Reported Symptoms: Cognitive Cognitive Status:  (patient able to communicate via phone and I am able to understand his speech.)      Neurological Neurological Review of Symptoms: No symptoms reported Neurological Comment: left side weakness with no changes  HEENT HEENT Symptoms Reported: Not assessed      Cardiovascular Cardiovascular Symptoms Reported: No symptoms reported Does patient have uncontrolled Hypertension?: No Cardiovascular Self-Management Outcome: 4 (good)  Respiratory Respiratory Symptoms Reported: No symptoms reported    Endocrine Endocrine Symptoms Reported: No symptoms reported Is patient diabetic?: No    Gastrointestinal Additional  Gastrointestinal Details: reports appetite good now.  denies any changes to swallowing. . Gastrointestinal Comment: reviewed chewing well. small bites.    Genitourinary Genitourinary Symptoms Reported: No symptoms reported    Integumentary Integumentary Symptoms Reported: No symptoms reported    Musculoskeletal Musculoskelatal Symptoms Reviewed: No symptoms reported        Psychosocial Psychosocial Symptoms Reported: Not assessed         Vitals:   09/04/24 1413  BP: 107/77    Medications Reviewed Today     Reviewed by Rumalda Alan PENNER, RN (Registered Nurse) on 09/04/24 at 1411  Med List Status: <None>   Medication Order Taking? Sig Documenting Provider Last Dose Status Informant  aspirin  EC 81 MG tablet [id number]  Take 1 tablet (81 mg total) by mouth daily. Swallow whole.  Patient not taking: Reported on 09/04/2024   Whitfield [name], NP  Active Self, Spouse/Significant Other, Pharmacy Records  docusate sodium  (COLACE) 100 MG capsule [id number] Yes Take 100 mg by mouth at bedtime. [provider]  Active Self, Spouse/Significant Other, Pharmacy Records  DULoxetine  (CYMBALTA ) 60 MG capsule [id number] Yes TAKE 1 CAPSULE BY MOUTH EVERY DAY [place], [name], MD  Active Self, Spouse/Significant Other, Pharmacy Records  esomeprazole  (NEXIUM ) 40 MG capsule [id number] Yes TAKE 1 CAPSULE (40 MG TOTAL) BY MOUTH 2 (TWO) TIMES DAILY BEFORE A MEAL. Craig Alan SAUNDERS, PA-C  Active Self, Spouse/Significant Other, Pharmacy Records  lovastatin  (MEVACOR ) 20 MG tablet [id number] Yes TAKE 1 TABLET BY MOUTH EVERYDAY AT BEDTIME [place], [name], MD  Active Self,  Spouse/Significant Other, Pharmacy Records  Melatonin 10 MG TABS [id number] Yes Take 20 mg by mouth at bedtime. [provider]  Active Self, Spouse/Significant Other, Pharmacy Records  methocarbamol (ROBAXIN) 500 MG tablet [id number]  Take 500 mg by mouth 3 (three) times daily as needed.  Patient not taking: Reported on 09/04/2024    [provider]  Active Self, Spouse/Significant Other, Pharmacy Records  Multiple Vitamin (MULTIVITAMIN WITH MINERALS) TABS tablet [id number] Yes Take 1 tablet by mouth daily. [provider]  Active Self, Spouse/Significant Other, Pharmacy Records  olmesartan  (BENICAR ) 20 MG tablet [id number] Yes TAKE 1 TABLET BY MOUTH EVERY DAY [place], [name], MD  Active Self, Spouse/Significant Other, Pharmacy Records  Oxycodone  HCl 10 MG TABS [id number] Yes Take 1 tablet (10 mg total) by mouth 2 (two) times daily. [place], [name], MD  Active Self, Spouse/Significant Other, Pharmacy Records  traZODone  (DESYREL ) 50 MG tablet [id number] Yes TAKE 1 TABLET BY MOUTH EVERYDAY AT BEDTIME [place], [name], MD  Active Self, Spouse/Significant Other, Pharmacy Records            Goals       Increase physical activity (pt-stated)      Increase physical activity (pt-stated)      Remain active.      Patient stated (pt-stated)      I want to get back to normal.      Quit smoking / using tobacco      [facility] Transitions of Care (TOC) Care Plan      Problems:  Recent Hospitalization for treatment of subdural hematoma  08/29/2024 Discussed with wife that patient has had a new CT yesterday and she reports that she has read the results and will discuss with neuro surgery tomorrow. Patient is currently having a kyloplasty of his back at this time.  Wife reports no changes in mental status. 09/04/2024 symptoms are resolving.  No more back pain, walking is better per wife,  no headaches.   BP per wife is within normal limits. Continues to hold medication for BP. Patient reports that his appetite is better. They are following swallowing precautions. Wife thinks patient is much improved. They are on vacation! No Hospital Follow Up Provider appointment - wife to call to schedule.  08/29/2024 completed  Goal:  Over the next 30 days, the patient will not experience hospital readmission  Interventions:  Transitions  of Care: Doctor Visits  - discussed the importance of doctor visits Post discharge activity limitations prescribed by provider reviewed Reviewed all medications and ensured patient knows how to take his medications.  Assessed mental status with wife.  Reviewed safety precautions and discuss fall prevention Reviewed importance of recognizing signs of stroke. Reviewed signs of stroke and when to call 911.  Confirms aspirin  remains on hold Scheduled next telephone call.  Patient Self Care Activities:  Attend all scheduled provider appointments Call provider office for new concerns or questions  Notify RN Care Manager of TOC call rescheduling needs Participate in Transition of Care Program/Attend TOC scheduled calls Take medications as prescribed   Have fun on your vacation.   Plan:  Telephone follow up appointment with care management team member scheduled for:  09/12/2024  2pm The patient has been provided with contact information for the care management team and has been advised to call with any health related questions or concerns.         Recommendation:   Continue Current Plan of Care Call 911 for any symptoms of stroke  Follow  Up Plan:   Telephone follow up appointment date/time:  09/12/2024 at 2pm  Alan Ee, RN, BSN, CEN [employer]- Transition of Care Team.  [facility] [phone]

## 2024-09-12 ENCOUNTER — Other Ambulatory Visit: Payer: Self-pay

## 2024-09-12 NOTE — Patient Instructions (Signed)
 Visit Information  Thank you for taking time to visit with me today. Please don't hesitate to contact me if I can be of assistance to you before our next scheduled telephone appointment.  Our next appointment is by telephone on 09/20/2024 at 10 am   Following is a copy of your care plan:   Goals Addressed             This Visit's Progress    [facility] Transitions of Care (TOC) Care Plan       Problems:  Recent Hospitalization for treatment of subdural hematoma  08/29/2024 Discussed with wife that patient has had a new CT yesterday and she reports that she has read the results and will discuss with neuro surgery tomorrow. Patient is currently having a kyloplasty of his back at this time.  Wife reports no changes in mental status. 09/04/2024 symptoms are resolving.  No more back pain, walking is better per wife,  no headaches.   BP per wife is within normal limits. Continues to hold medication for BP. Patient reports that his appetite is better. They are following swallowing precautions. Wife thinks patient is much improved. They are on vacation!  09/12/2024  Improving per wife, no headaches or dizziness.  Pending CT next week.  No Hospital Follow Up Provider appointment - wife to call to schedule.  08/29/2024 completed  Goal:  Over the next 30 days, the patient will not experience hospital readmission  Interventions:  Transitions of Care: Doctor Visits  - discussed the importance of doctor visits Reviewed all medications and ensured patient knows how to take his medications.  Assessed mental status with wife.  Reviewed safety precautions and discuss fall prevention Reviewed importance of recognizing signs of stroke. Reviewed signs of stroke and when to call 911.  Reviewed with wife CT looks like schedule for abdomen but order reads for CT head.  Scheduled next telephone call.  Reviewed progress.  Patient Self Care Activities:  Attend all scheduled provider appointments Call provider  office for new concerns or questions  Notify RN Care Manager of TOC call rescheduling needs Participate in Transition of Care Program/Attend TOC scheduled calls Take medications as prescribed   Wife to call regarding CT to confirm that CT is over the head not abdomen.  Reviewed importance of continuing to be observant of frequency of BM's.  Reviewed importance of avoiding falls.  Wife doing a great job taking care of patient. Keep up the great work.    Plan:  Telephone follow up appointment with care management team member scheduled for:  09/20/2024 at 10 am The patient has been provided with contact information for the care management team and has been advised to call with any health related questions or concerns.         Care plan and visit instructions communicated with the patient verbally today. Patient agrees to receive a copy in MyChart. Active MyChart status and patient understanding of how to access instructions and care plan via MyChart confirmed with patient.     Telephone follow up appointment with care management team member scheduled for:  09/20/2024 at 10 am  Please call the care guide team at [phone] if you need to cancel or reschedule your appointment.   Please call the Suicide and Crisis Lifeline: 988 call the [place]  National Suicide Prevention Lifeline: 905-222-9625 or TTY: (707)408-1966 TTY (873)599-1760) to talk to a trained counselor call 1-800-273-TALK (toll free, 24 hour hotline) call 911 if you are experiencing a Mental Health or  Behavioral Health Crisis or need someone to talk to.  Alan Ee, RN, BSN, CEN [employer]- Transition of Care Team.  [facility] [phone]

## 2024-09-12 NOTE — Transitions of Care (Post Inpatient/ED Visit) (Signed)
 Transition of Care week 4  Visit Note  09/12/2024  Name: Richard Vaughn MRN: [id number]          DOB: 1951/09/06  Situation: Patient enrolled in [facility] 30-day program. Visit completed with patient wife  by telephone.   Background:   Initial Transition Care Management Follow-up Telephone Call Discharge Date and Diagnosis: 08/18/24, Subdural hematoma   Past Medical History:  Diagnosis Date   Barrett's esophagus    Cerebrovascular accident (CVA) due to thrombosis of left carotid artery (HCC) 09/23/2015   Chronic back pain    CVA (cerebral vascular accident) (HCC) 09/22/2016   Depression    Gastritis    Mild   Gastroparesis    GERD (gastroesophageal reflux disease)    Headache    History of kidney stones    Hyperglycemia    Hypertension    ICH (intracerebral hemorrhage) (HCC) - R thalmic/PLIC d/t HTN 93/98/7980   Lacunar infarct, acute (HCC) 08/25/2015   Nephrolithiasis    Pneumonia    Covid pneumonia   Renal cyst    Sleep apnea    Stricture and stenosis of esophagus    Stroke (HCC) 07/2015   Stroke-like symptom 09/22/2016   Treatment-emergent central sleep apnea 10/25/2018   Vision abnormalities     Assessment: Per patients wife.  Patient is making a lot of improvement.  Wife is very happy with progress. Pending follow up CT on 09/17/2024 Patient Reported Symptoms: Cognitive Cognitive Status: Other: (phone call with wife only)      Neurological Neurological Review of Symptoms: Other: Neurological Management Strategies: Medical device Neurological Comment: denies headaches and dizziness,  Pending CT next week.  Able to walk into kitchen with rollator.  Getting up and using the urinal.  Now not needing direction of where to go in the house. denies any falls.  HEENT HEENT Symptoms Reported: Not assessed HEENT Management Strategies: Routine screening    Cardiovascular Cardiovascular Symptoms Reported: No symptoms reported Does patient have uncontrolled Hypertension?:  No Cardiovascular Self-Management Outcome: 4 (good)  Respiratory Respiratory Symptoms Reported: No symptoms reported Respiratory Management Strategies: Routine screening Respiratory Self-Management Outcome: 5 (very good)  Endocrine Endocrine Symptoms Reported: No symptoms reported Is patient diabetic?: No Endocrine Self-Management Outcome: 4 (good)  Gastrointestinal Gastrointestinal Symptoms Reported: Constipation, Diarrhea Additional Gastrointestinal Details: off colace depending on BM.  LBM  09/10/2024      Genitourinary Genitourinary Symptoms Reported: Incontinence Additional Genitourinary Details: decrease in incontinence, now using urinal Genitourinary Management Strategies: Medication therapy Genitourinary Self-Management Outcome: 5 (very good)  Integumentary Integumentary Symptoms Reported: No symptoms reported    Musculoskeletal Musculoskelatal Symptoms Reviewed: No symptoms reported        Psychosocial Psychosocial Symptoms Reported: Not assessed         Today's Vitals   09/12/24 1418 09/12/24 1419  BP:  114/78  PainSc: 0-No pain 0-No pain     Medications Reviewed Today     Reviewed by Rumalda Alan PENNER, RN (Registered Nurse) on 09/12/24 at 1436  Med List Status: <None>   Medication Order Taking? Sig Documenting Provider Last Dose Status Informant  aspirin  EC 81 MG tablet [id number]  Take 1 tablet (81 mg total) by mouth daily. Swallow whole.  Patient not taking: Reported on 09/04/2024   Whitfield [name], NP  Active Self, Spouse/Significant Other, Pharmacy Records  docusate sodium  (COLACE) 100 MG capsule [id number]  Take 100 mg by mouth at bedtime. [provider]  Active Self, Spouse/Significant Other, Pharmacy Records  DULoxetine  (CYMBALTA ) 60 MG capsule [id number]  TAKE 1 CAPSULE BY MOUTH EVERY DAY [place], [name], MD  Active Self, Spouse/Significant Other, Pharmacy Records  esomeprazole  (NEXIUM ) 40 MG capsule [id number]  TAKE 1 CAPSULE (40 MG TOTAL) BY MOUTH 2  (TWO) TIMES DAILY BEFORE A MEAL. Craig Alan SAUNDERS, PA-C  Active Self, Spouse/Significant Other, Pharmacy Records  lovastatin  (MEVACOR ) 20 MG tablet [id number]  TAKE 1 TABLET BY MOUTH EVERYDAY AT BEDTIME [place], [name], MD  Active Self, Spouse/Significant Other, Pharmacy Records  Melatonin 10 MG TABS [id number]  Take 20 mg by mouth at bedtime. [provider]  Active Self, Spouse/Significant Other, Pharmacy Records  methocarbamol (ROBAXIN) 500 MG tablet [id number]  Take 500 mg by mouth 3 (three) times daily as needed.  Patient not taking: Reported on 09/04/2024   [provider]  Active Self, Spouse/Significant Other, Pharmacy Records  Multiple Vitamin (MULTIVITAMIN WITH MINERALS) TABS tablet [id number]  Take 1 tablet by mouth daily. [provider]  Active Self, Spouse/Significant Other, Pharmacy Records  olmesartan  (BENICAR ) 20 MG tablet [id number]  TAKE 1 TABLET BY MOUTH EVERY DAY [place], [name], MD  Active Self, Spouse/Significant Other, Pharmacy Records  Oxycodone  HCl 10 MG TABS [id number]  Take 1 tablet (10 mg total) by mouth 2 (two) times daily. [place], [name], MD  Active Self, Spouse/Significant Other, Pharmacy Records  traZODone  (DESYREL ) 50 MG tablet [id number]  TAKE 1 TABLET BY MOUTH EVERYDAY AT BEDTIME [place], [name], MD  Active Self, Spouse/Significant Other, Pharmacy Records            Goals       Increase physical activity (pt-stated)      Increase physical activity (pt-stated)      Remain active.      Patient stated (pt-stated)      I want to get back to normal.      Quit smoking / using tobacco      [facility] Transitions of Care (TOC) Care Plan      Problems:  Recent Hospitalization for treatment of subdural hematoma  08/29/2024 Discussed with wife that patient has had a new CT yesterday and she reports that she has read the results and will discuss with neuro surgery tomorrow. Patient is currently having a kyloplasty of his back at this time.  Wife  reports no changes in mental status. 09/04/2024 symptoms are resolving.  No more back pain, walking is better per wife,  no headaches.   BP per wife is within normal limits. Continues to hold medication for BP. Patient reports that his appetite is better. They are following swallowing precautions. Wife thinks patient is much improved. They are on vacation!  09/12/2024  Improving per wife, no headaches or dizziness.  Pending CT next week.  No Hospital Follow Up Provider appointment - wife to call to schedule.  08/29/2024 completed  Goal:  Over the next 30 days, the patient will not experience hospital readmission  Interventions:  Transitions of Care: Doctor Visits  - discussed the importance of doctor visits Reviewed all medications and ensured patient knows how to take his medications.  Assessed mental status with wife.  Reviewed safety precautions and discuss fall prevention Reviewed importance of recognizing signs of stroke. Reviewed signs of stroke and when to call 911.  Reviewed with wife CT looks like schedule for abdomen but order reads for CT head.  Scheduled next telephone call.  Reviewed progress.  Patient Self Care Activities:  Attend all scheduled provider appointments Call provider office for new concerns or questions  Notify RN  Care Manager of TOC call rescheduling needs Participate in Transition of Care Program/Attend TOC scheduled calls Take medications as prescribed   Wife to call regarding CT to confirm that CT is over the head not abdomen.  Reviewed importance of continuing to be observant of frequency of BM's.  Reviewed importance of avoiding falls.  Wife doing a great job taking care of patient. Keep up the great work.    Plan:  Telephone follow up appointment with care management team member scheduled for:  09/20/2024 at 10 am The patient has been provided with contact information for the care management team and has been advised to call with any health related  questions or concerns.          Recommendation:   Continue Current Plan of Care  Follow Up Plan:   Telephone follow up appointment date/time:  09/20/2024 at 10 am  Alan Ee, RN, BSN, [employer]- Transition of Care Team.  [facility] [phone]

## 2024-09-13 ENCOUNTER — Other Ambulatory Visit: Payer: Self-pay | Admitting: Family Medicine

## 2024-09-13 DIAGNOSIS — G894 Chronic pain syndrome: Secondary | ICD-10-CM

## 2024-09-13 DIAGNOSIS — G8929 Other chronic pain: Secondary | ICD-10-CM

## 2024-09-13 MED ORDER — OXYCODONE HCL 10 MG PO TABS: 10.0000 mg | ORAL_TABLET | Freq: Two times a day (BID) | ORAL | 0 refills | Status: DC

## 2024-09-13 NOTE — Telephone Encounter (Signed)
 Copied from CRM #[id number]. Topic: Clinical - Medication Refill >> Sep 13, 2024 10:26 AM Revonda D wrote: Medication: Oxycodone  HCl 10 MG TABS  Has the patient contacted their pharmacy? Yes (Agent: If no, request that the patient contact the pharmacy for the refill. If patient does not wish to contact the pharmacy document the reason why and proceed with request.) (Agent: If yes, when and what did the pharmacy advise?)  This is the patient's preferred pharmacy:   [id number] - [place], [place] - [address] AT [address] [facility] [address] [place]  [address] Phone: [phone] Fax: [phone]   Is this the correct pharmacy for this prescription? Yes If no, delete pharmacy and type the correct one.   Has the prescription been filled recently? No  Is the patient out of the medication? No, 2 days left  Has the patient been seen for an appointment in the last year OR does the patient have an upcoming appointment? Yes  Can we respond through MyChart? Yes  Agent: Please be advised that Rx refills may take up to 3 business days. We ask that you follow-up with your pharmacy.

## 2024-09-17 ENCOUNTER — Ambulatory Visit
Admission: RE | Admit: 2024-09-17 | Discharge: 2024-09-17 | Disposition: A | Discharge status: Home / Self Care | Source: Ambulatory Visit | Attending: Neurosurgery | Admitting: Neurosurgery

## 2024-09-17 DIAGNOSIS — S065XAA Traumatic subdural hemorrhage with loss of consciousness status unknown, initial encounter: Secondary | ICD-10-CM

## 2024-09-20 ENCOUNTER — Other Ambulatory Visit (HOSPITAL_COMMUNITY): Payer: Self-pay | Admitting: Neurosurgery

## 2024-09-20 ENCOUNTER — Other Ambulatory Visit: Payer: Self-pay

## 2024-09-20 ENCOUNTER — Other Ambulatory Visit: Payer: Self-pay | Admitting: Neurosurgery

## 2024-09-20 DIAGNOSIS — S065XAA Traumatic subdural hemorrhage with loss of consciousness status unknown, initial encounter: Secondary | ICD-10-CM

## 2024-09-20 NOTE — Patient Instructions (Signed)
 Visit Information  Thank you for taking time to visit with me today. Please don't hesitate to contact me if I can be of assistance to you before our next scheduled telephone appointment.  Following are the goals we discussed today:   Goals Addressed             This Visit's Progress    [facility] Transitions of Care (TOC) Care Plan       Problems:  Recent Hospitalization for treatment of subdural hematoma  08/29/2024 Discussed with wife that patient has had a new CT yesterday and she reports that she has read the results and will discuss with neuro surgery tomorrow. Patient is currently having a kyloplasty of his back at this time.  Wife reports no changes in mental status. 09/04/2024 symptoms are resolving.  No more back pain, walking is better per wife,  no headaches.   BP per wife is within normal limits. Continues to hold medication for BP. Patient reports that his appetite is better. They are following swallowing precautions. Wife thinks patient is much improved. They are on vacation!  09/12/2024  Improving per wife, no headaches or dizziness.  Pending CT next week. 09/20/2024  Phone call with wife who is concerned about new CT results. Patient and wife to see neurosurgeon later today. Wife denies any changes in  symptoms except circuliing the chair before sitting.  Wife reports that patient is asking for direction in the home. This is only the new symptom. Wife reports that patient is eating well and sleeping well.  No Hospital Follow Up Provider appointment - wife to call to schedule.  08/29/2024 completed  Goal:  Over the next 30 days, the patient will not experience hospital readmission  Interventions:  Transitions of Care: Doctor Visits  - discussed the importance of doctor visits Reviewed all medications and ensured patient knows how to take his medications.  Assessed mental status with wife.  Reviewed safety precautions and discuss fall prevention Reviewed importance of recognizing  signs of stroke. Reviewed signs of stroke and when to call 911.  Confirmed patient has had CT. Wife has reviewed results. Pending Neurosurgeon evaluation later today.  Scheduled next telephone call.  Reviewed extending TOC program to offer wife support.   Reviewed current symptoms.  Patient Self Care Activities:  Attend all scheduled provider appointments Call provider office for new concerns or questions  Notify RN Care Manager of TOC call rescheduling needs Participate in Transition of Care Program/Attend TOC scheduled calls Take medications as prescribed   Reviewed importance of avoiding falls.  Go to neurosurgeon today as planned.   Plan:  Telephone follow up appointment with care management team member scheduled for:  09/24/2024 at 10 am The patient has been provided with contact information for the care management team and has been advised to call with any health related questions or concerns.          Our next appointment is by telephone on 09/24/2024 at 10 am  Please call the care guide team at [phone] if you need to cancel or reschedule your appointment.   If you are experiencing a Mental Health or Behavioral Health Crisis or need someone to talk to, please call the Suicide and Crisis Lifeline: 988 call the [place]  National Suicide Prevention Lifeline: 302-245-0911 or TTY: 703-828-6001 TTY 541-820-0453) to talk to a trained counselor call 1-800-273-TALK (toll free, 24 hour hotline) call 911   Care plan and visit instructions communicated with the patient verbally today. Patient agrees to  receive a copy in MyChart. Active MyChart status and patient understanding of how to access instructions and care plan via MyChart confirmed with patient.    Alan Ee, RN, BSN, CEN [employer]- Transition of Care Team.  [facility] [phone]

## 2024-09-20 NOTE — Transitions of Care (Post Inpatient/ED Visit) (Signed)
 Transition of Care week 5  Visit Note  09/20/2024  Name: Richard Vaughn MRN: [id number]          DOB: 08-Oct-1951  Situation: Patient enrolled in [facility] 30-day program. Visit completed with wife of patient by telephone.   Background:   Initial Transition Care Management Follow-up Telephone Call Discharge Date and Diagnosis: 08/18/24, Subdural hematoma   Past Medical History:  Diagnosis Date   Barrett's esophagus    Cerebrovascular accident (CVA) due to thrombosis of left carotid artery (HCC) 09/23/2015   Chronic back pain    CVA (cerebral vascular accident) (HCC) 09/22/2016   Depression    Gastritis    Mild   Gastroparesis    GERD (gastroesophageal reflux disease)    Headache    History of kidney stones    Hyperglycemia    Hypertension    ICH (intracerebral hemorrhage) (HCC) - R thalmic/PLIC d/t HTN 93/98/7980   Lacunar infarct, acute (HCC) 08/25/2015   Nephrolithiasis    Pneumonia    Covid pneumonia   Renal cyst    Sleep apnea    Stricture and stenosis of esophagus    Stroke (HCC) 07/2015   Stroke-like symptom 09/22/2016   Treatment-emergent central sleep apnea 10/25/2018   Vision abnormalities    Today's Vitals   09/20/24 1016  PainSc: 0-No pain     Assessment: Wife reports that she is concerned about CT results.  Reports that she understands that patient is continuing to have a brain bleed that is increasing.  Reports otherwise patient is doing well.  Patient Reported Symptoms: Cognitive Cognitive Status: Other: (phone call with wife only)      Neurological Neurological Review of Symptoms: Other: [place] Neurological Symptoms/Conditions [RPT]: circuling ambulating before sitting. Asking for direction about where to go.  decreased choking sensation. Still using rollator. Neurological Management Strategies: Medical device Neurological Self-Management Outcome: 5 (very good)  HEENT HEENT Symptoms Reported: Other: HEENT Comment: decreased choking sensation     Cardiovascular Cardiovascular Symptoms Reported: No symptoms reported Does patient have uncontrolled Hypertension?: No Cardiovascular Management Strategies: Medication therapy  Respiratory Respiratory Symptoms Reported: Other: Other Respiratory Symptoms: upper airway congestion. Respiratory Management Strategies: Routine screening  Endocrine Endocrine Symptoms Reported: No symptoms reported Is patient diabetic?: No Endocrine Self-Management Outcome: 4 (good)  Gastrointestinal Gastrointestinal Symptoms Reported: Constipation Additional Gastrointestinal Details: formed stool 09/18/2024 Gastrointestinal Management Strategies: Medication therapy Gastrointestinal Self-Management Outcome: 5 (very good)    Genitourinary Genitourinary Symptoms Reported: Incontinence Additional Genitourinary Details: decreased incontinence.    Integumentary Integumentary Symptoms Reported: No symptoms reported    Musculoskeletal Musculoskelatal Symptoms Reviewed: Limited mobility Additional Musculoskeletal Details: rollator.  no falls. Musculoskeletal Self-Management Outcome: 4 (good)      Psychosocial Psychosocial Symptoms Reported: Not assessed         Today's Vitals   09/20/24 1016  PainSc: 0-No pain     Medications Reviewed Today     Reviewed by Rumalda Alan PENNER, RN (Registered Nurse) on 09/20/24 at 1008  Med List Status: <None>   Medication Order Taking? Sig Documenting Provider Last Dose Status Informant  aspirin  EC 81 MG tablet [id number]  Take 1 tablet (81 mg total) by mouth daily. Swallow whole.  Patient not taking: Reported on 09/20/2024   Whitfield [name], NP  Active Self, Spouse/Significant Other, Pharmacy Records  docusate sodium  (COLACE) 100 MG capsule [id number] Yes Take 100 mg by mouth at bedtime. [provider]  Active Self, Spouse/Significant Other, Pharmacy Records  DULoxetine  (CYMBALTA ) 60 MG capsule [id number] Yes TAKE 1  CAPSULE BY MOUTH EVERY DAY Jordan, [name], MD   Active Self, Spouse/Significant Other, Pharmacy Records  esomeprazole  (NEXIUM ) 40 MG capsule [id number] Yes TAKE 1 CAPSULE (40 MG TOTAL) BY MOUTH 2 (TWO) TIMES DAILY BEFORE A MEAL. Craig Alan SAUNDERS, PA-C  Active Self, Spouse/Significant Other, Pharmacy Records  lovastatin  (MEVACOR ) 20 MG tablet [id number] Yes TAKE 1 TABLET BY MOUTH EVERYDAY AT BEDTIME Jordan, [name], MD  Active Self, Spouse/Significant Other, Pharmacy Records  Melatonin 10 MG TABS [id number] Yes Take 20 mg by mouth at bedtime. [provider]  Active Self, Spouse/Significant Other, Pharmacy Records  methocarbamol (ROBAXIN) 500 MG tablet [id number]  Take 500 mg by mouth 3 (three) times daily as needed.  Patient not taking: Reported on 09/20/2024   [provider]  Active Self, Spouse/Significant Other, Pharmacy Records  Multiple Vitamin (MULTIVITAMIN WITH MINERALS) TABS tablet [id number] Yes Take 1 tablet by mouth daily. [provider]  Active Self, Spouse/Significant Other, Pharmacy Records  olmesartan  (BENICAR ) 20 MG tablet [id number] Yes TAKE 1 TABLET BY MOUTH EVERY DAY Jordan, [name], MD  Active Self, Spouse/Significant Other, Pharmacy Records  Oxycodone  HCl 10 MG TABS [id number] Yes Take 1 tablet (10 mg total) by mouth 2 (two) times daily. Jordan, [name], MD  Active   traZODone  (DESYREL ) 50 MG tablet [id number] Yes TAKE 1 TABLET BY MOUTH EVERYDAY AT BEDTIME Jordan, [name], MD  Active Self, Spouse/Significant Other, Pharmacy Records            Goals Addressed             This Visit's Progress    [facility] Transitions of Care (TOC) Care Plan       Problems:  Recent Hospitalization for treatment of subdural hematoma  08/29/2024 Discussed with wife that patient has had a new CT yesterday and she reports that she has read the results and will discuss with neuro surgery tomorrow. Patient is currently having a kyloplasty of his back at this time.  Wife reports no changes in mental status. 09/04/2024 symptoms  are resolving.  No more back pain, walking is better per wife,  no headaches.   BP per wife is within normal limits. Continues to hold medication for BP. Patient reports that his appetite is better. They are following swallowing precautions. Wife thinks patient is much improved. They are on vacation!  09/12/2024  Improving per wife, no headaches or dizziness.  Pending CT next week. 09/20/2024  Phone call with wife who is concerned about new CT results. Patient and wife to see neurosurgeon later today. Wife denies any changes in  symptoms except circuliing the chair before sitting.  Wife reports that patient is asking for direction in the home. This is only the new symptom. Wife reports that patient is eating well and sleeping well.  No Hospital Follow Up Provider appointment - wife to call to schedule.  08/29/2024 completed  Goal:  Over the next 30 days, the patient will not experience hospital readmission  Interventions:  Transitions of Care: Doctor Visits  - discussed the importance of doctor visits Reviewed all medications and ensured patient knows how to take his medications.  Assessed mental status with wife.  Reviewed safety precautions and discuss fall prevention Reviewed importance of recognizing signs of stroke. Reviewed signs of stroke and when to call 911.  Confirmed patient has had CT. Wife has reviewed results. Pending Neurosurgeon evaluation later today.  Scheduled next telephone call.  Reviewed extending TOC program to offer wife support.  Reviewed current symptoms.  Patient Self Care Activities:  Attend all scheduled provider appointments Call provider office for new concerns or questions  Notify RN Care Manager of TOC call rescheduling needs Participate in Transition of Care Program/Attend TOC scheduled calls Take medications as prescribed   Reviewed importance of avoiding falls.  Go to neurosurgeon today as planned.   Plan:  Telephone follow up appointment with care  management team member scheduled for:  09/24/2024 at 10 am The patient has been provided with contact information for the care management team and has been advised to call with any health related questions or concerns.         Recommendation:   Continue Current Plan of Care See neurosurgeon as planned later today.  Follow Up Plan:   Telephone follow up appointment date/time:  09/24/2024 at 10am  Alan Ee, RN, BSN, CEN Population Health- Transition of Care Team.  [facility] [phone]

## 2024-09-22 ENCOUNTER — Other Ambulatory Visit: Payer: Self-pay | Admitting: Family Medicine

## 2024-09-22 DIAGNOSIS — E785 Hyperlipidemia, unspecified: Secondary | ICD-10-CM

## 2024-09-22 DIAGNOSIS — I639 Cerebral infarction, unspecified: Secondary | ICD-10-CM

## 2024-09-22 DIAGNOSIS — I10 Essential (primary) hypertension: Secondary | ICD-10-CM

## 2024-09-24 ENCOUNTER — Other Ambulatory Visit: Payer: Self-pay

## 2024-09-24 NOTE — Progress Notes (Signed)
 Subjective  Patient ID:  Richard Vaughn is a 73 y.o. (DOB 04-06-1951) male No chief complaint on file.    Pain Pertinent negatives include no chest pain, chills, diaphoresis, fatigue or fever.   History of Present Illness The patient is a 73 year old male who presents today for low back pain follow-up related to a compression fracture of the L2 vertebrae.  He experienced a fall, resulting in a compression fracture of the L2 vertebra. The fall was due to an uneven surface that caused him to lose his balance.  He felt an immediate increase in that low back pain after the fall.  The pain is central and radiated up to the left and the lumbar paraspinal musculature.  He has chronic back pain that he manages with oxycodone  10 mg twice daily and reports this nearly eliminated all of his pain prior to the fall.  He had an acute increase in pain obviously following the fall related to the injury.   He did go to urgent care after the fall and was found to have L2 compression fracture and was referred to our office.  He denies pain radiating into his lower extremities.  Denies bladder or bowel dysfunction, saddle anesthesia or red flag signs or symptoms.    He has a history of back surgery, L5-S1 artificial disc replacement, performed in [place] , which was successful.  He also unfortunately has also been dealing with a subdural hematoma and the complications related to that condition concurrently with this acute back injury. His MRI confirmed acuity of the L2 compression fracture with 30% height loss.  L2 percutaneous kyphoplasty 08/29/2024 with with the patient reports is a complete relief of lumbar discomfort.  He tolerated it very well and has had no complications following the procedure.  He denies any radicular symptoms or adjacent level pain.  No new falls or injuries.  He is interested in returning back to physical therapy and activity once he can.  He has not yet seen his primary  care physician in follow-up for osteoporosis management including an updated DEXA scan.  He currently is not taking any supplementation.  PMH: CVA x 3, hypertension, GERD     Reviewed and updated this visit by provider: Tobacco  Allergies  Meds  Problems  Med Hx  Surg Hx  Fam Hx        Review of Systems  Constitutional:  Negative for activity change, appetite change, chills, diaphoresis, fatigue, fever and unexpected weight change.  Respiratory:  Negative for shortness of breath.   Cardiovascular:  Negative for chest pain.  Genitourinary:  Negative for difficulty urinating.  Neurological:  Positive for speech difficulty.    - Pertinent systems reviewed and negative except as noted in HPI.      Objective   There were no vitals filed for this visit.    On examination today, patient is alert and pleasant he is accompanied by his wife.  He presents in a wheelchair.  His speech is augmented due to history of CVA but examination of his lumbar region reveals no breaks in the skin around 70 infection including excess warmth or erythema.  He is able demonstrate some gentle range of motion across the lumbar spine without any increase in pain.  No obvious radicular symptoms today on his exam but again some disuse and changes related to his other neurological issues.  Results Imaging  - X-ray of the spine: L2 compression fracture with approximately 30% loss of height.  L5-S1 disc  arthroplasty well-positioned without complications.  Diffuse spondylosis throughout lumbar spine.  With mild decrease in disc height, anterior spurring.    Assessment and Plan  1. Compression fracture of L2 vertebra with routine healing, subsequent encounter (Primary)     Assessment & Plan 1. L2 compression fracture, Low back pain: Discussed with patient the findings of his examination today as well as the results following his L2 kyphoplasty.  He is done extremely well and has essentially resolution of his  pain without any adjacent level pain or radicular symptoms.  He can gradually increase activities as tolerated more at the discretion of his neurologist in dealing and recovery from his subdural hematoma than the kyphoplasty itself.  2.  Osteoporosis: Patient will follow-up with primary care doctor to discuss his bone density.  I do recommend a DEXA scan if it has not been done in the last 2 years.  Recommend aggressive treatment if patient does have osteoporosis to hopefully decrease or prevent future fractures.  In the meantime leading up to his visit we did discuss calcium  and vitamin D  supplementation.  Routine reevaluation in 3 months and sooner if needed.  As long as he is doing well at that point time we will discharge him from care here.    Risks, benefits, and alternatives of the medications and treatment plan prescribed today were discussed, and patient expressed understanding. Plan follow-up as discussed or as needed if any worsening symptoms or change in condition.   Total time spent with patient was greater than 20 minutes at today's visit, including history and physical exam, ordering medications, review of previous OV notes and imaging, review of advanced imaging, assessment, plan of care, and documentation.  Questions were encouraged and answered. Continue current medications as discussed above. Continue current HEP.

## 2024-09-24 NOTE — Patient Instructions (Signed)
 Visit Information  Thank you for taking time to visit with me today. Please don't hesitate to contact me if I can be of assistance to you before our next scheduled telephone appointment.  Following are the goals we discussed today:   Goals Addressed             This Visit's Progress    COMPLETED: [facility] Transitions of Care (TOC) Care Plan       Problems:  Recent Hospitalization for treatment of subdural hematoma  08/29/2024 Discussed with wife that patient has had a new CT yesterday and she reports that she has read the results and will discuss with neuro surgery tomorrow. Patient is currently having a kyloplasty of his back at this time.  Wife reports no changes in mental status. 09/04/2024 symptoms are resolving.  No more back pain, walking is better per wife,  no headaches.   BP per wife is within normal limits. Continues to hold medication for BP. Patient reports that his appetite is better. They are following swallowing precautions. Wife thinks patient is much improved. They are on vacation!  09/12/2024  Improving per wife, no headaches or dizziness.  Pending CT next week. 09/20/2024  Phone call with wife who is concerned about new CT results. Patient and wife to see neurosurgeon later today. Wife denies any changes in  symptoms except circuliing the chair before sitting.  Wife reports that patient is asking for direction in the home. This is only the new symptom. Wife reports that patient is eating well and sleeping well. 09/24/2024 wife reports pending embolization on 10/02/2024.  She reports no changes since last week.. Patient continues to have need for direction when ambulating. Denies headache or neuro changes. Wife reports no new symptoms.   No Hospital Follow Up Provider appointment - wife to call to schedule.  08/29/2024 completed  Goal:  Over the next 30 days, the patient will not experience hospital readmission  Interventions:  Transitions of Care: Doctor Visits  - discussed the  importance of doctor visits Reviewed all medications and ensured patient knows how to take his medications.  Assessed mental status with wife.  Reviewed safety precautions and discuss fall preventionReviewed importance of recognizing signs of stroke. Reviewed signs of stroke and when to call 911.  Reviewed with wife plans for moving forward.   Wife you prefer to close case and hopes that he will trigger again after procedure.  I reviewed that wife can call me if needed. Offered CCM and wife declines at this time. She is appreciative of bond and support for the last 5 weeks.    Reviewed current symptoms.  Patient Self Care Activities:  Attend all scheduled provider appointments Call provider office for new concerns or questions  Take medications as prescribed   Reviewed importance of avoiding falls.  Call 911 for any neuro decline.   Plan:  Case closed to TOC.  Wife has my contact information if she needs assistance.         If you are experiencing a Mental Health or Behavioral Health Crisis or need someone to talk to, please      Alan Ee, RN, BSN, CEN Population Health- Transition of Care Team.  [facility] [phone]

## 2024-09-24 NOTE — Transitions of Care (Post Inpatient/ED Visit) (Signed)
 Transition of Care week 6  Visit Note  09/24/2024  Name: Richard Vaughn MRN: [id number]          DOB: Jan 16, 1951  Situation: Patient enrolled in [facility] 30-day program. Visit completed with wife of patient by telephone.   Background:   Initial Transition Care Management Follow-up Telephone Call Discharge Date and Diagnosis: No data recorded   Past Medical History:  Diagnosis Date   Barrett's esophagus    Cerebrovascular accident (CVA) due to thrombosis of left carotid artery (HCC) 09/23/2015   Chronic back pain    CVA (cerebral vascular accident) (HCC) 09/22/2016   Depression    Gastritis    Mild   Gastroparesis    GERD (gastroesophageal reflux disease)    Headache    History of kidney stones    Hyperglycemia    Hypertension    ICH (intracerebral hemorrhage) (HCC) - R thalmic/PLIC d/t HTN 93/98/7980   Lacunar infarct, acute (HCC) 08/25/2015   Nephrolithiasis    Pneumonia    Covid pneumonia   Renal cyst    Sleep apnea    Stricture and stenosis of esophagus    Stroke (HCC) 07/2015   Stroke-like symptom 09/22/2016   Treatment-emergent central sleep apnea 10/25/2018   Vision abnormalities     Assessment: Patient Reported Symptoms: Cognitive Cognitive Status: Other: (wife denies any changes. Patient currently needing directional asistance verbally.)      Neurological Neurological Review of Symptoms: Other: Oher Neurological Symptoms/Conditions [RPT]: no changes from last week. Continues to need directional assitance. WIfe denies headache. Neurological Management Strategies: Medical device  HEENT HEENT Symptoms Reported: Not assessed      Cardiovascular Cardiovascular Symptoms Reported: Not assessed    Respiratory Respiratory Symptoms Reported: Not assesed    Endocrine Endocrine Symptoms Reported: No symptoms reported Is patient diabetic?: No Endocrine Self-Management Outcome: 5 (very good)  Gastrointestinal Gastrointestinal Symptoms Reported: Other Other  Gastrointestinal Symptoms: wife reports bowels are moving well. Gastrointestinal Management Strategies: Medication therapy Gastrointestinal Self-Management Outcome: 5 (very good)    Genitourinary Genitourinary Symptoms Reported: Not assessed    Integumentary Integumentary Symptoms Reported: Not assessed    Musculoskeletal Musculoskelatal Symptoms Reviewed: Limited mobility Other Musculoskeletal Symptoms: continues to use a walker        Psychosocial Psychosocial Symptoms Reported: Not assessed         Vitals:   09/24/24 1025  BP: 131/84    Medications Reviewed Today     Reviewed by Rumalda Alan PENNER, RN (Registered Nurse) on 09/24/24 at 1009  Med List Status: <None>   Medication Order Taking? Sig Documenting Provider Last Dose Status Informant  aspirin  EC 81 MG tablet [id number] Yes Take 1 tablet (81 mg total) by mouth daily. Swallow whole. Whitfield [name], NP  Active Self, Spouse/Significant Other, Pharmacy Records  docusate sodium  (COLACE) 100 MG capsule [id number] Yes Take 100 mg by mouth at bedtime. [provider]  Active Self, Spouse/Significant Other, Pharmacy Records  DULoxetine  (CYMBALTA ) 60 MG capsule [id number] Yes TAKE 1 CAPSULE BY MOUTH EVERY DAY Jordan, [name], MD  Active Self, Spouse/Significant Other, Pharmacy Records  esomeprazole  (NEXIUM ) 40 MG capsule [id number] Yes TAKE 1 CAPSULE (40 MG TOTAL) BY MOUTH 2 (TWO) TIMES DAILY BEFORE A MEAL. Craig Alan SAUNDERS, PA-C  Active Self, Spouse/Significant Other, Pharmacy Records  lovastatin  (MEVACOR ) 20 MG tablet [id number] Yes TAKE 1 TABLET BY MOUTH EVERYDAY AT BEDTIME Jordan, [name], MD  Active   Melatonin 10 MG TABS [id number] Yes Take 20 mg by mouth at bedtime. [provider]  Active Self, Spouse/Significant Other, Pharmacy Records  methocarbamol (ROBAXIN) 500 MG tablet [id number] Yes Take 500 mg by mouth 3 (three) times daily as needed. [provider]  Active Self, Spouse/Significant Other, Pharmacy  Records  Multiple Vitamin (MULTIVITAMIN WITH MINERALS) TABS tablet [id number] Yes Take 1 tablet by mouth daily. [provider]  Active Self, Spouse/Significant Other, Pharmacy Records  olmesartan  (BENICAR ) 20 MG tablet [id number] Yes TAKE 1 TABLET BY MOUTH EVERY DAY Jordan, [name], MD  Active Self, Spouse/Significant Other, Pharmacy Records  Oxycodone  HCl 10 MG TABS [id number] Yes Take 1 tablet (10 mg total) by mouth 2 (two) times daily. Jordan, [name], MD  Active   traZODone  (DESYREL ) 50 MG tablet [id number] Yes TAKE 1 TABLET BY MOUTH EVERYDAY AT BEDTIME Jordan, [name], MD  Active Self, Spouse/Significant Other, Pharmacy Records            Goals Addressed             This Visit's Progress    COMPLETED: [facility] Transitions of Care (TOC) Care Plan       Problems:  Recent Hospitalization for treatment of subdural hematoma  08/29/2024 Discussed with wife that patient has had a new CT yesterday and she reports that she has read the results and will discuss with neuro surgery tomorrow. Patient is currently having a kyloplasty of his back at this time.  Wife reports no changes in mental status. 09/04/2024 symptoms are resolving.  No more back pain, walking is better per wife,  no headaches.   BP per wife is within normal limits. Continues to hold medication for BP. Patient reports that his appetite is better. They are following swallowing precautions. Wife thinks patient is much improved. They are on vacation!  09/12/2024  Improving per wife, no headaches or dizziness.  Pending CT next week. 09/20/2024  Phone call with wife who is concerned about new CT results. Patient and wife to see neurosurgeon later today. Wife denies any changes in  symptoms except circuliing the chair before sitting.  Wife reports that patient is asking for direction in the home. This is only the new symptom. Wife reports that patient is eating well and sleeping well. 09/24/2024 wife reports pending embolization on  10/02/2024.  She reports no changes since last week.. Patient continues to have need for direction when ambulating. Denies headache or neuro changes. Wife reports no new symptoms.   No Hospital Follow Up Provider appointment - wife to call to schedule.  08/29/2024 completed  Goal:  Over the next 30 days, the patient will not experience hospital readmission  Interventions:  Transitions of Care: Doctor Visits  - discussed the importance of doctor visits Reviewed all medications and ensured patient knows how to take his medications.  Assessed mental status with wife.  Reviewed safety precautions and discuss fall preventionReviewed importance of recognizing signs of stroke. Reviewed signs of stroke and when to call 911.  Reviewed with wife plans for moving forward.   Wife you prefer to close case and hopes that he will trigger again after procedure.  I reviewed that wife can call me if needed. Offered CCM and wife declines at this time. She is appreciative of bond and support for the last 5 weeks.    Reviewed current symptoms.  Patient Self Care Activities:  Attend all scheduled provider appointments Call provider office for new concerns or questions  Take medications as prescribed   Reviewed importance of avoiding falls.  Call 911 for any  neuro decline.   Plan:  Case closed to TOC.  Wife has my contact information if she needs assistance.         Recommendation:   Continue Current Plan of Care  Follow Up Plan:   Closing From:  Transitions of Care Program  Alan Ee, RN, BSN, [employer]- Transition of Care Team.  [facility] [phone]

## 2024-09-25 ENCOUNTER — Encounter (HOSPITAL_COMMUNITY): Payer: Self-pay

## 2024-09-25 ENCOUNTER — Encounter: Payer: Self-pay | Admitting: Neurology

## 2024-09-25 ENCOUNTER — Ambulatory Visit (INDEPENDENT_AMBULATORY_CARE_PROVIDER_SITE_OTHER): Admitting: Neurology

## 2024-09-25 VITALS — BP 135/94 | HR 74 | Ht 74.0 in | Wt 247.4 lb

## 2024-09-25 DIAGNOSIS — I6203 Nontraumatic chronic subdural hemorrhage: Secondary | ICD-10-CM | POA: Insufficient documentation

## 2024-09-25 DIAGNOSIS — R413 Other amnesia: Secondary | ICD-10-CM

## 2024-09-25 DIAGNOSIS — G3184 Mild cognitive impairment, so stated: Secondary | ICD-10-CM

## 2024-09-25 DIAGNOSIS — S065XAA Traumatic subdural hemorrhage with loss of consciousness status unknown, initial encounter: Secondary | ICD-10-CM

## 2024-09-25 DIAGNOSIS — Z8673 Personal history of transient ischemic attack (TIA), and cerebral infarction without residual deficits: Secondary | ICD-10-CM | POA: Diagnosis not present

## 2024-09-25 DIAGNOSIS — G8110 Spastic hemiplegia affecting unspecified side: Secondary | ICD-10-CM

## 2024-09-25 NOTE — Progress Notes (Signed)
 Surgical Instructions   Your procedure is scheduled on Tuesday, 10/02/24. Report to [facility] Main Entrance A at  9:00 A.M., then check in with the Admitting office. Any questions or running late day of surgery: call [phone]  Questions prior to your surgery date: call [phone], Monday-Friday, 8am-4pm. If you experience any cold or flu symptoms such as cough, fever, chills, shortness of breath, etc. between now and your scheduled surgery, please notify us  at the above number.     Remember:  Do not eat or drink after midnight the night before your surgery- Monday    Take these medicines the morning of surgery with A SIP OF WATER: DULoxetine  (CYMBALTA )  esomeprazole  (NEXIUM )   Oxycodone  HCl   Vibegron     May take these medicines IF NEEDED: methocarbamol (ROBAXIN)     One week prior to surgery, STOP taking any Aspirin  (unless otherwise instructed by your surgeon) Aleve, Naproxen, Ibuprofen, Motrin, Advil, Goody's, BC's, all herbal medications, fish oil, and non-prescription vitamins.                     Do NOT Smoke (Tobacco/Vaping) for 24 hours prior to your procedure.  If you use a CPAP at night, you may bring your mask/headgear for your overnight stay.   You will be asked to remove any contacts, glasses, piercing's, hearing aid's, dentures/partials prior to surgery. Please bring cases for these items if needed.    Patients discharged the day of surgery will not be allowed to drive home, and someone needs to stay with them for 24 hours.  SURGICAL WAITING ROOM VISITATION Patients may have no more than 2 support people in the waiting area - these visitors may rotate.   Pre-op nurse will coordinate an appropriate time for 1 ADULT support person, who may not rotate, to accompany patient in pre-op.  Children under the age of 39 must have an adult with them who is not the patient and must remain in the main waiting area with an adult.  If the patient needs to stay at  the hospital during part of their recovery, the visitor guidelines for inpatient rooms apply.  Please refer to the [facility] website for the visitor guidelines for any additional information.   If you received a COVID test during your pre-op visit  it is requested that you wear a mask when out in public, stay away from anyone that may not be feeling well and notify your surgeon if you develop symptoms. If you have been in contact with anyone that has tested positive in the last 10 days please notify you surgeon.      Pre-operative CHG Bathing Instructions   You can play a key role in reducing the risk of infection after surgery. Your skin needs to be as free of germs as possible. You can reduce the number of germs on your skin by washing with CHG (chlorhexidine  gluconate) soap before surgery. CHG is an antiseptic soap that kills germs and continues to kill germs even after washing.   DO NOT use if you have an allergy to chlorhexidine /CHG or antibacterial soaps. If your skin becomes reddened or irritated, stop using the CHG and notify one of our RNs at [phone].              TAKE A SHOWER THE NIGHT BEFORE SURGERY   Please keep in mind the following:  DO NOT shave, including legs and underarms, 48 hours prior to surgery.   You may  shave your face before/day of surgery.  Place clean sheets on your bed the night before surgery Use a clean washcloth (not used since being washed) for shower. DO NOT sleep with pet's night before surgery.  CHG Shower Instructions:  Wash your face and private area with normal soap. If you choose to wash your hair, wash first with your normal shampoo.  After you use shampoo/soap, rinse your hair and body thoroughly to remove shampoo/soap residue.  Turn the water OFF and apply half the bottle of CHG soap to a CLEAN washcloth.  Apply CHG soap ONLY FROM YOUR NECK DOWN TO YOUR TOES (washing for 3-5 minutes)  DO NOT use CHG soap on face, private areas, open  wounds, or sores.  Pay special attention to the area where your surgery is being performed.  If you are having back surgery, having someone wash your back for you may be helpful. Wait 2 minutes after CHG soap is applied, then you may rinse off the CHG soap.  Pat dry with a clean towel  Put on clean pajamas    Additional instructions for the day of surgery: If you choose, you may shower the morning of surgery with an antibacterial soap.  DO NOT APPLY any lotions, deodorants, cologne, or perfumes.   Do not wear jewelry or makeup Do not wear nail polish, gel polish, artificial nails, or any other type of covering on natural nails (fingers and toes) Do not bring valuables to the hospital. [facility] is not responsible for valuables/personal belongings. Put on clean/comfortable clothes.  Please brush your teeth.  Ask your nurse before applying any prescription medications to the skin.

## 2024-09-25 NOTE — Progress Notes (Signed)
 [facility] [address] [place]. [place] [address] [phone]       STROKE FOLLOW UP NOTE  Richard Vaughn Date of Birth:  1951-06-13 Medical Record Number:  [id number]   Primary neurologist: Richard Vaughn Reason for visit: Transient altered mental status    SUBJECTIVE:   CHIEF COMPLAINT:  Chief Complaint  Patient presents with   RM17/STROKE    Pt is here with his Wife. Pt's wife states that pt has a subdural hematoma    HPI:  Update 09/25/2024 : Patient returns for follow-up after last visit practitioner a year ago.  He is accompanied by his wife.  History is obtained from them and review of electronic medical records.  I personally reviewed pertinent available imaging films in PACS.  Patient presented with multifocal complaints in October 2025.  Increased left-sided weakness  , gait difficulties.  A few falls without any direct head injury, diaphoresis, tremulousness and urinary frequency.  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  Patient was seen by neurosurgeon Dr. Mavis recommend conservative follow-up and no surgical intervention.  Follow-up CT head on 08/28/2024 showed persistent 10 mm subdural hematoma with mild local mass effect without significant midline shift.  However repeat CT head on 09/17/2024 showed increase in the left convexity subdural hematoma measuring 12 mm with trace rightward midline shift.  Patient was seen by neurosurgery again and they plan to do middle meningeal artery embolization on 10/02/2024 with Dr. Lanis.  Patient is presently getting physical and Occupational Therapy at home.  He is able to ambulate with a walker.  He has had no recent falls.  He has stopped his aspirin .  He states his blood pressure has been fluctuating but today it is under good control at 135/94.  Patient states his memory  difficulties are unchanged.  Continues to have poor short-term memory but is not getting worse.  On Mini-Mental status exam today he scored 24/30 which is actually stable from a year ago. Update 10/05/2023 Richard Vaughn: Patient returns for 38-month follow-up accompanied by his wife.  He was previously seen by Amy, NP and reported doing well overall.  Denies new stroke/TIA symptoms.  Cognition overall stable, MMSE today 24/30, prior 25/30.  Continued left-sided weakness, LUE stiffness which can be bothersome at times and limit function.  Ambulates with a cane, denies any recent falls. Remains on aspirin  and lovastatin .  Blood pressure well-controlled.  Routinely follows with PCP Dr. Jordan for stroke risk factor management.  Update 09/29/2022 : She returns for follow-up after last visit with Richard Vaughn nurse practitioner 2-1/2 months ago.  He is accompanied by his wife.  Patient states his memory difficulties was unchanged since the last visit.  He has a new complaint of worsening of his balance.  He has fallen twice in the last month and fell today as well.  Patient does not use his cane as he is supposed to.  He has residual left-sided weakness and in his left leg from his stroke.  Denies any tingling numbness or burning in his feet.  Denies any headache or vertigo.  Denies any new strokelike symptoms.  He denies any neck pain or radicular pain or back pain.  Currently is tolerating well without bruising or bleeding.  His blood pressures under good control and today it is 118/79.  He is tolerating Mevacor  well without muscle aches and  pains. Update 07/13/2022 Richard Vaughn: Patient returns for acute visit accompanied by his wife. He was previously seen by Richard Vaughn 9 months ago and relatively stable at that time.  Reports last week over a 3-day period, he works transient episodes of difficulty remembering how to put his shoes on, how to go up steps, and how to get his leg to move.  These lasted less than 1 minute and then remembered how  to do these tasks and completely at baseline.  Denies any associated headache, new or worsening stroke/TIA symptoms or seizure activity.  No prior history of migraine headaches or seizures.  Denies any recent medication changes, illness or infection.  Denies any substance use.  Baseline mild cognitive impairment poststroke but believes this has been stable.  MMSE today 22/30.  Compliant on aspirin  and lovastatin .  Blood pressure well controlled.  Routinely followed by PCP Dr. Jordan.  No further concerns at this time      History provided for reference purposes only Update 09/29/2021 Richard Vaughn: He returns for follow-up after last visit 4 months ago.  He is accompanied by his wife.  He is doing well.  He has had no recurrent stroke or TIA symptoms.  Continues to have mild left-sided weakness, stiffness, speech difficulties and numbness.  Is tolerating aspirin  well without bruising or bleeding.  His blood pressure is quite well controlled and today it is 111/73.  He has had no health issues except he had COVID earlier last year and recovered very well from that.  He and his wife are considering going to [facility] to consider participation in the transcranial magnetic stimulation study in the rehab program for stroke.  Update 05/26/2020 Richard Vaughn: Richard Vaughn returns accompanied by his wife for stroke follow-up.  Sooner visit requested to be cleared for torn rotator cuff surgery. Residual deficits of left-sided weakness, gait impairment with imbalance, dysarthria and cognitive impairment.  Continues to work with outpatient neuro rehab PT/OT/ST with ongoing improvement but limited rehab of LUE due to torn rotator cuff.  He did receive cortisone injection with some benefit.  Blood pressure today 112/73 - monitors at home which has been stable.  Order placed for CT head after prior visit but this has not been completed at this time.  He is currently being [facility] with Dr. Josefa Herring.  No  further concerns at this time.  Initial visit 03/19/2020 Richard Vaughn: Richard Vaughn is being seen for hospital follow-up accompanied by his brother and wife.  Residual deficits left-sided weakness, gait impairment, dysarthria and mild to moderate cognitive communication deficits.  Continues to participate in outpatient therapies at neuro rehab with ongoing improvement.  Use of rollator walker outside of home denies any recent falls.  Blood pressure today 122/78.  History of mixed sleep apnea.  Has self discontinued CPAP approx 6 months ago due to difficulty tolerating masks.  Trialed 2 different types of masks previously but has increased anxiety with mask use.  Recent diagnosis of rotator cuff tear and questions undergoing surgical procedure.  No further concerns at this time.   Stroke admission 02/22/2020 Richard Vaughn is a 73 y.o. 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   Stroke  work-up revealed acute right thalamic hemorrhage secondary to history of HTN.  History of HTN stable during admission with long-term BP goal normotensive range.  LDL 73 with statin contraindicated with ICH.  No history of DM.  Other stroke risk factors include advanced age, PAD, tobacco use, history of substance abuse, obesity, prior history of stroke and family history of stroke.  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  Evaluated by therapies and recommended discharge to [name]  for ongoing therapy needs and eventually discharged home on 03/07/2020.  Stroke: acute right thalamic hemorrhage secondary to hx HTN  Resultant dysarthria and left face and extremity weakness Code Stroke CT Head - Positive for acute right thalamic hemorrhage (6 mL) with mild surrounding edema and minimal mass effect. No intraventricular or extra-axial extension at this time. Underlying advanced chronic small vessel disease.  CTA H&N - No large vessel occlusion. Minimal atherosclerosis in the head and neck - although there is at least moderate stenosis suspected at the origin of the non-dominant left vertebral artery origin. MRI head W & WO - 2.1 x 2.1 x 2.2 cm acute intraparenchymal hemorrhage centered at the right thalamus, unchanged from previous. Associated mild surrounding vasogenic edema and localized regional mass effect, with up to 3 mm of localized right-to-left shift. No underlying mass lesion, abnormal enhancement, or other abnormality identified. Multiple remote lacunar infarcts involving the left basal ganglia and thalamus.  2D Echo - EF 60 - 65%. No cardiac source of emboli identified.  Sars Corona Virus 2 - negative LDL - 73 HgbA1c - 5.4  VTE prophylaxis - SCDs aspirin  81 mg daily prior to admission, now on No antithrombotic Therapy recommendations:  [name] Disposition: [name]     ROS:   14 system review of systems performed and negative with exception of those listed in HPI  PMH:  Past Medical History:  Diagnosis Date   Barrett's esophagus    Cerebrovascular accident (CVA) due to thrombosis of left carotid artery (HCC) 09/23/2015   Chronic back pain    CVA (cerebral vascular accident) (HCC) 09/22/2016   Depression    Gastritis    Mild   Gastroparesis    GERD (gastroesophageal reflux disease)    Headache    History of kidney stones    Hyperglycemia    Hypertension    ICH (intracerebral hemorrhage) (HCC) - R thalmic/PLIC d/t HTN 93/98/7980   Lacunar infarct, acute (HCC)  08/25/2015   Nephrolithiasis    Pneumonia    Covid pneumonia   Renal cyst    Sleep apnea    Stricture and stenosis of esophagus    Stroke (HCC) 07/2015   Stroke-like symptom 09/22/2016   Treatment-emergent central sleep apnea 10/25/2018   Vision abnormalities     PSH:  Past Surgical History:  Procedure Laterality Date   CERVICAL DISC SURGERY  2012   COLONOSCOPY     LUMBAR DISC SURGERY  2005, 2010   replaced L4 and L5   SHOULDER ARTHROSCOPY WITH ROTATOR CUFF REPAIR Left 08/14/2020   Procedure: SHOULDER ARTHROSCOPY WITH ROTATOR CUFF REPAIR, SUBACROMIAL DECOMPRESSION;  Surgeon: Dozier Soulier, MD;  Location: [facility];  Service: Orthopedics;  Laterality: Left;   UPPER GASTROINTESTINAL ENDOSCOPY      Social History:  Social History   Socioeconomic History   Marital status: Married    Spouse name: Lamorris Knoblock  Number of children: 3   Years of education: Not on file   Highest education level: Some college, no degree  Occupational History   Occupation: retired    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[employer]  Tobacco Use   Smoking status: Former    Current packs/day: 0.00    Average packs/day: 0.5 packs/day for 30.0 years (15.0 ttl pk-yrs)    Types: Cigarettes    Start date: 04/15/1988    Quit date: 04/15/2018    Years since quitting: 6.4   Smokeless tobacco: Never  Vaping Use   Vaping status: Never Used  Substance and Sexual Activity   Alcohol use: No    Alcohol/week: 0.0 standard drinks of alcohol   Drug use: Never    Comment: last use 04/2018   Sexual activity: Yes  Other Topics Concern   Not on file  Social History Narrative   Not on file   Social Drivers of Health   Financial Resource Strain: Low Risk  (04/27/2024)   Overall Financial Resource Strain (CARDIA)    Difficulty of Paying Living Expenses: Not hard at all  Food Insecurity: No Food Insecurity (08/22/2024)   Hunger Vital Sign    Worried About Running Out of Food in the Last Year: Never true    Ran Out of Food in the Last  Year: Never true  Transportation Needs: No Transportation Needs (08/22/2024)   PRAPARE - Administrator, Civil Service (Medical): No    Lack of Transportation (Non-Medical): No  Physical Activity: Sufficiently Active (04/27/2024)   Exercise Vital Sign    Days of Exercise per Week: 5 days    Minutes of Exercise per Session: 60 min  Stress: No Stress Concern Present (04/27/2024)   [employer] of Occupational Health - Occupational Stress Questionnaire    Feeling of Stress: Not at all  Social Connections: Moderately Isolated (08/16/2024)   Social Connection and Isolation Panel    Frequency of Communication with Friends and Family: More than three times a week    Frequency of Social Gatherings with Friends and Family: More than three times a week    Attends Religious Services: Never    Database Administrator or Organizations: No    Attends Banker Meetings: Never    Marital Status: Married  Catering Manager Violence: Not At Risk (08/22/2024)   Humiliation, Afraid, Rape, and Kick questionnaire    Fear of Current or Ex-Partner: No    Emotionally Abused: No    Physically Abused: No    Sexually Abused: No    Family History:  Family History  Problem Relation Age of Onset   Hypertension Father    Stroke Father    Hypertension Mother    Liver cancer Mother    Hypertension Sister        2 other sisters has also   Stroke Sister    Stroke Sister    Hypertension Brother        2nd brother has also   Colon cancer Neg Hx    Esophageal cancer Neg Hx    Rectal cancer Neg Hx    Stomach cancer Neg Hx    Colon polyps Neg Hx     Medications:   Current Outpatient Medications on File Prior to Visit  Medication Sig Dispense Refill   docusate sodium  (COLACE) 100 MG capsule Take 100 mg by mouth at bedtime.     DULoxetine  (CYMBALTA ) 60 MG capsule TAKE 1 CAPSULE BY MOUTH EVERY DAY 90 capsule 3  esomeprazole  (NEXIUM ) 40 MG capsule TAKE 1 CAPSULE (40 MG TOTAL) BY MOUTH 2  (TWO) TIMES DAILY BEFORE A MEAL. 180 capsule 1   lovastatin  (MEVACOR ) 20 MG tablet TAKE 1 TABLET BY MOUTH EVERYDAY AT BEDTIME 90 tablet 1   Melatonin 10 MG TABS Take 20 mg by mouth at bedtime.     Multiple Vitamin (MULTIVITAMIN WITH MINERALS) TABS tablet Take 1 tablet by mouth daily.     olmesartan  (BENICAR ) 20 MG tablet TAKE 1 TABLET BY MOUTH EVERY DAY 90 tablet 3   Oxycodone  HCl 10 MG TABS Take 1 tablet (10 mg total) by mouth 2 (two) times daily. 60 tablet 0   traZODone  (DESYREL ) 50 MG tablet TAKE 1 TABLET BY MOUTH EVERYDAY AT BEDTIME 90 tablet 2   Vibegron (GEMTESA) 75 MG TABS      [Paused] aspirin  EC 81 MG tablet Take 1 tablet (81 mg total) by mouth daily. Swallow whole. (Patient not taking: Reported on 09/25/2024) 30 tablet 11   methocarbamol (ROBAXIN) 500 MG tablet Take 500 mg by mouth 3 (three) times daily as needed.     No current facility-administered medications on file prior to visit.    Allergies:  No Known Allergies    OBJECTIVE:  Physical Exam  Vitals:   09/25/24 0839  BP: (!) 135/94  Pulse: 74  SpO2: 97%  Weight: 247 lb 6.4 oz (112.2 kg)  Height: 6' 2 (1.88 m)   Body mass index is 31.76 kg/m. No results found.   General: well developed, well nourished, pleasant middle-aged African-American male, seated, in no evident distress Head: head normocephalic and atraumatic.   Neck: supple with no carotid or supraclavicular bruits Cardiovascular: regular rate and rhythm, no murmurs Musculoskeletal: no deformity Skin:  no rash/petichiae Vascular:  Normal pulses all extremities   Neurologic Exam Mental Status: Awake and fully alert. moderate dysarthria.  No evidence of aphasia.  Oriented to place and time. Recent and remote memory intact. Attention span, concentration and fund of knowledge appropriate during visit. Mood and affect appropriate.  Diminished recall 2/3.  Able to name only 9 animals which can walk on 4 legs.  Clock drawing 4/4.  Mental status exam 24/30  today    09/25/2024    8:42 AM 10/05/2023    1:44 PM 04/04/2023    1:19 PM  MMSE - Mini Mental State Exam  Orientation to time 4 5 5   Orientation to Place 5 5 5   Registration 3 3 3   Attention/ Calculation 1 1 0  Recall 3 3 3   Language- name 2 objects 2 2 2   Language- repeat 1 1 1   Language- follow 3 step command 3 3 3   Language- read & follow direction 1 0 1  Write a sentence 1 1 1   Copy design 0 0 1  Total score 24 24 25    Cranial Nerves: Pupils equal, briskly reactive to light. Extraocular movements full without nystagmus. Visual fields full to confrontation. Hearing intact. Facial sensation intact. Face, tongue, palate moves normally and symmetrically.  Motor: Normal bulk and tone.  Spastic left hemiparesis with 3/5 left upper extremity strength with limitation of shoulder abduction..  Tone is increased on the left compared to the right.  Left leg spasticity with 4+/5 weakness left ankle dorsiflexors. Sensory.: intact to touch , pinprick , position and vibratory sensation.  Coordination: Rapid alternating movements normal in all extremities except slightly decreased left hand. Finger-to-nose performed accurately RUE and heel-to-shin show mild LLE ataxia. Gait and Station: Deferred as  patient did not bring his cane and uses a wheelchair.   Reflexes: 2+ and asymmetric and brisker on the left. Toes downgoing.         ASSESSMENT/PLAN: Richard Vaughn is a 73 y.o. year old male with ICH  in right thalamus/posterior limb likely secondary to HTN in 02/2018 with residual mild left-sided weakness, mild dysarthria, gait impairment and mild cognitive impairment  Vascular risk factors include multiple prior strokes including lacunar infarcts and small hypertensive bleeds, HTN, HLD, PAD, sleep apnea, history of substance abuse and family history of stroke.  He has mild memory difficulties due to poststroke mild cognitive impairment which appears stable..   New diagnosis of left convexity subdural  hematoma October 2025 which appears to have worsened on follow-up imaging months later.   I had a long discussion with the patient and his wife regarding his new concerns of frequent falls and recommend further evaluation by checking MRI scan of the brain to rule out interval new stroke as well as MRI scan of the cervical spine compressive etiology.  We discussed fall prevention precautions and I advised her to use a cane at all times while walking.  Continue aspirin  for stroke prevention and good control of hypertension with blood pressure goal below 130/90 cholesterol goal below 70 mg percent.  Encouraged him to increase participation in cognitively challenging and like solving crossword puzzles, playing bridge and sudoku for his memory we also discussed memory compensation strategies.  Return for follow-up with Richard Vaughn nurse practitioner in 6 months or call earlier if necessary.   I personally spent a total of 50 minutes in the care of the patient today including getting/reviewing separately obtained history, performing a medically appropriate exam/evaluation, counseling and educating, placing orders, referring and communicating with other health care professionals, documenting clinical information in the EHR, independently interpreting results, and coordinating care.     Eather Popp, MD   Eather Popp, MD [facility] [address] [place], [place] [address]  Phone [phone] Fax [phone] Note: This document was prepared with digital dictation and possible smart phrase technology. Any transcriptional errors that result from this process are unintentional.

## 2024-09-25 NOTE — Patient Instructions (Addendum)
 I had a long discussion with the patient and his wife regarding his new diagnosis of subdural hematoma which appears to have increased on follow-up imaging and agree with plans for middle meningeal artery embolization with Dr. Lanis next week.  Continue to stay off aspirin .  We also discussed fall precautions.  Continuegood control of hypertension with blood pressure goal below 130/90 and LDL cholesterol goal below 70 mg percent.  Encouraged him to increase participation in cognitively challenging and like solving crossword puzzles, playing bridge and sudoku for his memory we also discussed memory compensation strategies.  Return for follow-up with Harlene nurse practitioner in 6 months or call earlier if necessary  Fall Prevention in the Home, Adult Falls can cause injuries and affect people of all ages. There are many simple things that you can do to make your home safe and to help prevent falls. If you need it, ask for help making these changes. What actions can I take to prevent falls? General information Use good lighting in all rooms. Make sure to: Replace any light bulbs that burn out. Turn on lights if it is dark and use night-lights. Keep items that you use often in easy-to-reach places. Lower the shelves around your home if needed. Move furniture so that there are clear paths around it. Do not keep throw rugs or other things on the floor that can make you trip. If any of your floors are uneven, fix them. Add color or contrast paint or tape to clearly mark and help you see: Grab bars or handrails. First and last steps of staircases. Where the edge of each step is. If you use a ladder or stepladder: Make sure that it is fully opened. Do not climb a closed ladder. Make sure the sides of the ladder are locked in place. Have someone hold the ladder while you use it. Know where your pets are as you move through your home. What can I do in the bathroom?     Keep the floor dry. Clean  up any water that is on the floor right away. Remove soap buildup in the bathtub or shower. Buildup makes bathtubs and showers slippery. Use non-skid mats or decals on the floor of the bathtub or shower. Attach bath mats securely with double-sided, non-slip rug tape. If you need to sit down while you are in the shower, use a non-slip stool. Install grab bars by the toilet and in the bathtub and shower. Do not use towel bars as grab bars. What can I do in the bedroom? Make sure that you have a light by your bed that is easy to reach. Do not use any sheets or blankets on your bed that hang to the floor. Have a firm bench or chair with side arms that you can use for support when you get dressed. What can I do in the kitchen? Clean up any spills right away. If you need to reach something above you, use a sturdy step stool that has a grab bar. Keep electrical cables out of the way. Do not use floor polish or wax that makes floors slippery. What can I do with my stairs? Do not leave anything on the stairs. Make sure that you have a light switch at the top and the bottom of the stairs. Have them installed if you do not have them. Make sure that there are handrails on both sides of the stairs. Fix handrails that are broken or loose. Make sure that handrails are as long  as the staircases. Install non-slip stair treads on all stairs in your home if they do not have carpet. Avoid having throw rugs at the top or bottom of stairs, or secure the rugs with carpet tape to prevent them from moving. Choose a carpet design that does not hide the edge of steps on the stairs. Make sure that carpet is firmly attached to the stairs. Fix any carpet that is loose or worn. What can I do on the outside of my home? Use bright outdoor lighting. Repair the edges of walkways and driveways and fix any cracks. Clear paths of anything that can make you trip, such as tools or rocks. Add color or contrast paint or tape to  clearly mark and help you see high doorway thresholds. Trim any bushes or trees on the main path into your home. Check that handrails are securely fastened and in good repair. Both sides of all steps should have handrails. Install guardrails along the edges of any raised decks or porches. Have leaves, snow, and ice cleared regularly. Use sand, salt, or ice melt on walkways during winter months if you live where there is ice and snow. In the garage, clean up any spills right away, including grease or oil spills. What other actions can I take? Review your medicines with your health care provider. Some medicines can make you confused or feel dizzy. This can increase your chance of falling. Wear closed-toe shoes that fit well and support your feet. Wear shoes that have rubber soles and low heels. Use a cane, walker, scooter, or crutches that help you move around if needed. Talk with your provider about other ways that you can decrease your risk of falls. This may include seeing a physical therapist to learn to do exercises to improve movement and strength. Where to find more information Centers for Disease Control and Prevention, STEAD[employer]: tonerpromos.no [employer] on Aging: baseringtones.pl National Institute on Aging: baseringtones.pl Contact a health care provider if: You are afraid of falling at home. You feel weak, drowsy, or dizzy at home. You fall at home. Get help right away if you: Lose consciousness or have trouble moving after a fall. Have a fall that causes a head injury. These symptoms may be an emergency. Get help right away. Call 911. Do not wait to see if the symptoms will go away. Do not drive yourself to the hospital. This information is not intended to replace advice given to you by your health care provider. Make sure you discuss any questions you have with your health care provider. Document Revised: 07/05/2022 Document Reviewed: 07/05/2022 Elsevier Patient Education  2024 [employer].

## 2024-09-26 ENCOUNTER — Encounter (HOSPITAL_COMMUNITY): Payer: Self-pay

## 2024-09-26 ENCOUNTER — Encounter (HOSPITAL_COMMUNITY)
Admission: RE | Admit: 2024-09-26 | Discharge: 2024-09-26 | Disposition: A | Discharge status: Home / Self Care | Source: Ambulatory Visit | Attending: Neurosurgery | Admitting: Neurosurgery

## 2024-09-26 ENCOUNTER — Other Ambulatory Visit: Payer: Self-pay

## 2024-09-26 VITALS — BP 129/99 | HR 77 | Temp 97.8°F | Resp 18 | Ht 74.0 in | Wt 244.0 lb

## 2024-09-26 DIAGNOSIS — I10 Essential (primary) hypertension: Secondary | ICD-10-CM | POA: Insufficient documentation

## 2024-09-26 DIAGNOSIS — I6203 Nontraumatic chronic subdural hemorrhage: Secondary | ICD-10-CM | POA: Insufficient documentation

## 2024-09-26 DIAGNOSIS — I719 Aortic aneurysm of unspecified site, without rupture: Secondary | ICD-10-CM | POA: Insufficient documentation

## 2024-09-26 DIAGNOSIS — Z01812 Encounter for preprocedural laboratory examination: Secondary | ICD-10-CM | POA: Diagnosis present

## 2024-09-26 DIAGNOSIS — I700 Atherosclerosis of aorta: Secondary | ICD-10-CM | POA: Insufficient documentation

## 2024-09-26 DIAGNOSIS — I69322 Dysarthria following cerebral infarction: Secondary | ICD-10-CM | POA: Diagnosis not present

## 2024-09-26 DIAGNOSIS — Z87891 Personal history of nicotine dependence: Secondary | ICD-10-CM | POA: Insufficient documentation

## 2024-09-26 DIAGNOSIS — Z01818 Encounter for other preprocedural examination: Secondary | ICD-10-CM

## 2024-09-26 DIAGNOSIS — I69354 Hemiplegia and hemiparesis following cerebral infarction affecting left non-dominant side: Secondary | ICD-10-CM | POA: Insufficient documentation

## 2024-09-26 DIAGNOSIS — J439 Emphysema, unspecified: Secondary | ICD-10-CM | POA: Insufficient documentation

## 2024-09-26 LAB — CBC WITH DIFFERENTIAL/PLATELET
Abs Immature Granulocytes: 0.02 K/uL (ref 0.00–0.07)
Basophils Absolute: 0 K/uL (ref 0.0–0.1)
Basophils Relative: 1 %
Eosinophils Absolute: 0.1 K/uL (ref 0.0–0.5)
Eosinophils Relative: 2 %
HCT: 43.9 % (ref 39.0–52.0)
Hemoglobin: 14.7 g/dL (ref 13.0–17.0)
Immature Granulocytes: 0 %
Lymphocytes Relative: 31 %
Lymphs Abs: 2 K/uL (ref 0.7–4.0)
MCH: 30 pg (ref 26.0–34.0)
MCHC: 33.5 g/dL (ref 30.0–36.0)
MCV: 89.6 fL (ref 80.0–100.0)
Monocytes Absolute: 0.6 K/uL (ref 0.1–1.0)
Monocytes Relative: 10 %
Neutro Abs: 3.5 K/uL (ref 1.7–7.7)
Neutrophils Relative %: 56 %
Platelets: 208 K/uL (ref 150–400)
RBC: 4.9 MIL/uL (ref 4.22–5.81)
RDW: 13.2 % (ref 11.5–15.5)
WBC: 6.3 K/uL (ref 4.0–10.5)
nRBC: 0 % (ref 0.0–0.2)

## 2024-09-26 LAB — BASIC METABOLIC PANEL WITH GFR
Anion gap: 11 (ref 5–15)
BUN: 9 mg/dL (ref 8–23)
CO2: 20 mmol/L — ABNORMAL LOW (ref 22–32)
Calcium: 8.7 mg/dL — ABNORMAL LOW (ref 8.9–10.3)
Chloride: 106 mmol/L (ref 98–111)
Creatinine, Ser: 0.9 mg/dL (ref 0.61–1.24)
GFR, Estimated: >60 mL/min (ref >60)
Glucose, Bld: 86 mg/dL (ref 70–99)
Potassium: 3.8 mmol/L (ref 3.5–5.1)
Sodium: 137 mmol/L (ref 135–145)

## 2024-09-26 LAB — PROTIME-INR
INR: 1.1 (ref 0.8–1.2)
Prothrombin Time: 14.8 s (ref 11.4–15.2)

## 2024-09-26 LAB — APTT: aPTT: 31 s (ref 24–36)

## 2024-09-26 LAB — SURGICAL PCR SCREEN
MRSA, PCR: NEGATIVE
Staphylococcus aureus: NEGATIVE

## 2024-09-26 NOTE — Progress Notes (Signed)
 PCP - Jordan, [name], MD  Cardiologist -  Neurology -Primary neurologist: Dr. Rosemarie  Neurology -Rosemarie Eather RAMAN, MD (LOV 09-25-24)  PPM/ICD - denies Device Orders - n/a Rep Notified - n/a  Chest x-ray - 08-15-24 EKG - 08-15-24 Stress Test -  ECHO - 02-23-20 Cardiac Cath -   Sleep Study -  CPAP -  per patient no longer uses  DM -denies  Blood Thinner Instructions:denies Aspirin  Instructions:Last dose 08-27-24  ERAS Protcol - NPO   COVID TEST- n/a   Anesthesia review: Yes, HX of HTN, OSA, GERD, HLD, CVA.  Ed visit 08-15-09-4-25 Fall in September, using a walker.  Patient denies shortness of breath, fever, cough and chest pain at PAT appointment.   All instructions explained to the patient, with a verbal understanding of the material. Patient agrees to go over the instructions while at home for a better understanding. Patient also instructed to self quarantine after being tested for COVID-19. The opportunity to ask questions was provided.

## 2024-09-27 NOTE — Progress Notes (Signed)
 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 He has known multiple prior strokes with left hemiparesis and some dysarthria. He presented to ED on 08/15/2024 with several weeks of worsening LLLE weakness, more falls, episodes of tachycardia and diaphoresis and changes in bowel and bladder control. Head CT showed mixed density SDH overlying the left cerebral hemisphere measuring up to 9 mm in thickness with mass effect on the left cerebral hemisphere with 2 mm rightward shift. He was admitted by neurosurgery until 08/18/2024 while undergoing serial imaging. EEG did not identify any seizure activity. ASA held. 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  Since discharge, he had follow-up with neurosurgery and neurology. Per 09/25/2024 visit with Dr. Rosemarie, Follow-up CT head on 08/28/2024 showed persistent 10 mm subdural hematoma with mild local mass effect without significant midline shift. However repeat CT head on 09/17/2024 showed increase in the left convexity subdural hematoma measuring 12 mm with trace rightward midline shift.  Patient was seen by neurosurgery again and they plan to do middle meningeal artery embolization on 10/02/2024 with Dr. Lanis. He continues with PT/PT at home. Ambulates with a walker. Memory difficulties unchanged. He scored 24/40 on Mini-Mental exam which was stable. Six month follow-up planned.  Anesthesia team to evaluate on the day of surgery.   VS: BP (!) 129/99   Pulse 77   Temp 36.6 C   Resp 18   Ht 6' 2 (1.88 m)   Wt 110.7 kg   SpO2 94%   BMI 31.33 kg/m  BP Readings from Last 3 Encounters:  09/26/24 (!) 129/99  09/25/24 (!) 135/94  09/24/24 131/84     PROVIDERS: Jordan, [name], MD is PCP  Rosemarie Rothman, MD is neurologist   LABS: Labs reviewed: Acceptable for surgery. (all labs ordered are listed, but only abnormal results are displayed)  Labs Reviewed  BASIC METABOLIC PANEL WITH GFR - Abnormal; Notable for the following components:      Result Value   CO2 20 (*)    Calcium  8.7 (*)    All other components within normal limits  SURGICAL PCR SCREEN  CBC WITH DIFFERENTIAL/PLATELET  PROTIME-INR  APTT    OTHER: EEG 08/17/2024: IMPRESSION: - This study is within normal limits. No seizures or epileptiform discharges were seen throughout the recording. - A normal interictal EEG does not exclude the diagnosis of epilepsy.  Split-Night Titration Study 07/20/2018: IMPRESSION :  1. 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  Central Sleep Apnea   2. Normal EKG, normal oxygen saturation, no PLMs.  RECOMMENDATIONS: CPAP did reduce the AHI but central apnea will unlikely resolve completely. We will order an auto-titration CPAP device 6-10 cm water with 1 cm EPR.  2) My  recommendation is to stay in non- supine sleep position,  which is difficult to achieve with a large FFM.  - Previous study 10/05/2016 noted Central Sleep Apnea at AHI 18.7/h. no prolonged desaturation, and the non-supine AHI was 3.4/h only. Primary recommendation was to avoid supine  sleep   IMAGES: CT Head 09/17/2024: IMPRESSION: 1. Mild interval enlargement of a left cerebral convexity subdural hematoma measuring up to 12 mm in thickness with trace rightward midline shift. 2. Severe chronic small vessel ischemic disease.  1V CXR 08/15/2024: FINDINGS: - The heart and mediastinal contours are unchanged. Atherosclerotic plaque. - No focal consolidation. Chronic coarsened interstitial markings with no overt pulmonary edema. No pleural effusion. No pneumothorax. - No acute osseous abnormality. IMPRESSION: 1. No active disease. 2. Aortic Atherosclerosis (ICD10-I70.0) and Emphysema.  CTA Chest 05/04/2024 (in setting of suspected aspiration PNA): IMPRESSION: 1. No evidence of pulmonary embolism to the level of distal segmental pulmonary arteries. 2. Right lower lobe consolidation and irregular consolidation within the right middle lobe, likely pneumonia. 3. Layering secretions within the trachea extending into bilateral airways with multifocal subsegmental mucous plugging. 4. Enlarged AP window lymph node, likely reactive. 5. Ascending thoracic aorta measures 4.1 cm. Recommend annual imaging followup by CTA or MRA. This recommendation follows 2010 ACCF/AHA/AATS/ACR/ASA/SCA/SCAI/SIR/STS/SVM Guidelines for the Diagnosis and Management of Patients with Thoracic Aortic Disease. Circulation. 2010; 121: Z733-z630. Aortic aneurysm NOS (ICD10-I71.9) 6. Aortic Atherosclerosis (ICD10-I70.0) and Emphysema (ICD10-J43.9). Coronary artery calcifications. Assessment for potential risk factor modification, dietary therapy or pharmacologic therapy may be warranted, if clinically indicated.   EKG: 08/15/2024: Sinus rhythm Abnormal R-wave progression, early transition Minimal ST elevation, inferior leads Baseline wander in lead(s) I V1 Confirmed by Lorette Mayo [phone]) on 08/16/2024 11:55:16 PM   CV: US  Carotid 11/02/2021: Summary:  - Right Carotid: The extracranial vessels  were near-normal with only minimal wall thickening or plaque.  - Left Carotid: The extracranial vessels were near-normal with only minimal wall thickening or plaque.  - Vertebrals:  Bilateral vertebral arteries demonstrate antegrade flow.  - Subclavians: Normal flow hemodynamics were seen in bilateral subclavian arteries.    Echo 02/23/2020: IMPRESSIONS   1. Left ventricular ejection fraction, by estimation, is 60 to 65%. The  left ventricle has normal function. The left ventricle has no regional  wall motion abnormalities. There is moderate left ventricular hypertrophy  of the basal-septal segment. Left  ventricular diastolic parameters were normal.   2. Right ventricular systolic function is normal. The right ventricular  size is normal. Tricuspid regurgitation signal is inadequate for assessing  PA pressure.   3. The mitral valve is normal in structure. Trivial mitral valve  regurgitation. No evidence of mitral stenosis.   4. The aortic valve was not well visualized. Aortic valve regurgitation  is not visualized. Mild aortic valve sclerosis is present, with no  evidence of aortic valve stenosis.   5. The inferior vena cava is dilated in size with >50% respiratory  variability, suggesting right atrial pressure of 8 mmHg.    Nuclear stress test 09/06/2002: IMPRESSION  FIXED DEFECT IN THE ANTEROLATERAL WALL ADJACENT TO THE APEX.  NO REVERSIBLE DEFECTS ARE SEEN.  CALCULATED EJECTION FRACTION 55%.    Past Medical History:  Diagnosis Date   Barrett's esophagus    Cerebrovascular accident (CVA) due to thrombosis of left carotid artery (HCC) 09/23/2015   Chronic back pain    CVA (cerebral vascular accident) (HCC) 09/22/2016   Depression    Gastritis    Mild   Gastroparesis  GERD (gastroesophageal reflux disease)    Headache    History of kidney stones    Hyperglycemia    Hypertension    ICH (intracerebral hemorrhage) (HCC) - R thalmic/PLIC d/t HTN 93/98/7980   Lacunar  infarct, acute (HCC) 08/25/2015   Nephrolithiasis    Pneumonia    Covid pneumonia   Renal cyst    Sleep apnea    Stricture and stenosis of esophagus    Stroke (HCC) 07/2015   Stroke-like symptom 09/22/2016   Treatment-emergent central sleep apnea 10/25/2018   Vision abnormalities     Past Surgical History:  Procedure Laterality Date   CERVICAL DISC SURGERY  2012   COLONOSCOPY     LUMBAR DISC SURGERY  2005, 2010   replaced L4 and L5   SHOULDER ARTHROSCOPY WITH ROTATOR CUFF REPAIR Left 08/14/2020   Procedure: SHOULDER ARTHROSCOPY WITH ROTATOR CUFF REPAIR, SUBACROMIAL DECOMPRESSION;  Surgeon: Dozier Soulier, MD;  Location: [facility];  Service: Orthopedics;  Laterality: Left;   UPPER GASTROINTESTINAL ENDOSCOPY      MEDICATIONS:  [Paused] aspirin  EC 81 MG tablet   docusate sodium  (COLACE) 100 MG capsule   DULoxetine  (CYMBALTA ) 60 MG capsule   esomeprazole  (NEXIUM ) 40 MG capsule   lovastatin  (MEVACOR ) 20 MG tablet   Melatonin 10 MG TABS   methocarbamol (ROBAXIN) 500 MG tablet   Multiple Vitamin (MULTIVITAMIN WITH MINERALS) TABS tablet   olmesartan  (BENICAR ) 20 MG tablet   Oxycodone  HCl 10 MG TABS   traZODone  (DESYREL ) 50 MG tablet   Vibegron (GEMTESA) 75 MG TABS   No current facility-administered medications for this encounter.    Isaiah Ruder, PA-C Surgical Short Stay/Anesthesiology [facility] Phone [phone] [facility] Phone [phone] 09/27/2024 12:56 PM

## 2024-09-27 NOTE — Anesthesia Preprocedure Evaluation (Signed)
 Anesthesia Evaluation    Airway        Dental   Pulmonary former smoker          Cardiovascular hypertension,      Neuro/Psych    GI/Hepatic   Endo/Other    Renal/GU      Musculoskeletal   Abdominal   Peds  Hematology   Anesthesia Other Findings   Reproductive/Obstetrics                              Anesthesia Physical Anesthesia Plan  ASA:   Anesthesia Plan:    Post-op Pain Management:    Induction:   PONV Risk Score and Plan:   Airway Management Planned:   Additional Equipment:   Intra-op Plan:   Post-operative Plan:   Informed Consent:   Plan Discussed with:   Anesthesia Plan Comments: (PAT note written 09/27/2024 by Ahmyah Gidley, PA-C.  )        Anesthesia Quick Evaluation

## 2024-09-30 ENCOUNTER — Encounter (HOSPITAL_COMMUNITY): Payer: Self-pay | Admitting: Emergency Medicine

## 2024-09-30 ENCOUNTER — Emergency Department (HOSPITAL_COMMUNITY)

## 2024-09-30 ENCOUNTER — Emergency Department (HOSPITAL_COMMUNITY)
Admission: EM | Admit: 2024-09-30 | Discharge: 2024-09-30 | Disposition: A | Discharge status: Home / Self Care | Attending: Emergency Medicine | Admitting: Emergency Medicine

## 2024-09-30 ENCOUNTER — Other Ambulatory Visit: Payer: Self-pay

## 2024-09-30 DIAGNOSIS — Z79899 Other long term (current) drug therapy: Secondary | ICD-10-CM | POA: Insufficient documentation

## 2024-09-30 DIAGNOSIS — S065X0A Traumatic subdural hemorrhage without loss of consciousness, initial encounter: Secondary | ICD-10-CM | POA: Insufficient documentation

## 2024-09-30 DIAGNOSIS — Z87891 Personal history of nicotine dependence: Secondary | ICD-10-CM | POA: Insufficient documentation

## 2024-09-30 DIAGNOSIS — W19XXXA Unspecified fall, initial encounter: Secondary | ICD-10-CM

## 2024-09-30 DIAGNOSIS — S065XAA Traumatic subdural hemorrhage with loss of consciousness status unknown, initial encounter: Secondary | ICD-10-CM

## 2024-09-30 DIAGNOSIS — I10 Essential (primary) hypertension: Secondary | ICD-10-CM | POA: Insufficient documentation

## 2024-09-30 DIAGNOSIS — W06XXXA Fall from bed, initial encounter: Secondary | ICD-10-CM | POA: Diagnosis not present

## 2024-09-30 DIAGNOSIS — Z7982 Long term (current) use of aspirin: Secondary | ICD-10-CM | POA: Diagnosis not present

## 2024-09-30 DIAGNOSIS — S0990XA Unspecified injury of head, initial encounter: Secondary | ICD-10-CM | POA: Diagnosis present

## 2024-09-30 LAB — CBC WITH DIFFERENTIAL/PLATELET
Abs Immature Granulocytes: 0.03 K/uL (ref 0.00–0.07)
Basophils Absolute: 0 K/uL (ref 0.0–0.1)
Basophils Relative: 1 %
Eosinophils Absolute: 0.1 K/uL (ref 0.0–0.5)
Eosinophils Relative: 2 %
HCT: 41.3 % (ref 39.0–52.0)
Hemoglobin: 13.7 g/dL (ref 13.0–17.0)
Immature Granulocytes: 0 %
Lymphocytes Relative: 22 %
Lymphs Abs: 1.8 K/uL (ref 0.7–4.0)
MCH: 30.4 pg (ref 26.0–34.0)
MCHC: 33.2 g/dL (ref 30.0–36.0)
MCV: 91.8 fL (ref 80.0–100.0)
Monocytes Absolute: 0.9 K/uL (ref 0.1–1.0)
Monocytes Relative: 11 %
Neutro Abs: 5.2 K/uL (ref 1.7–7.7)
Neutrophils Relative %: 64 %
Platelets: 183 K/uL (ref 150–400)
RBC: 4.5 MIL/uL (ref 4.22–5.81)
RDW: 13.2 % (ref 11.5–15.5)
WBC: 8.1 K/uL (ref 4.0–10.5)
nRBC: 0 % (ref 0.0–0.2)

## 2024-09-30 LAB — COMPREHENSIVE METABOLIC PANEL WITH GFR
ALT: 15 U/L (ref 0–44)
AST: 13 U/L — ABNORMAL LOW (ref 15–41)
Albumin: 3.1 g/dL — ABNORMAL LOW (ref 3.5–5.0)
Alkaline Phosphatase: 92 U/L (ref 38–126)
Anion gap: 9 (ref 5–15)
BUN: 12 mg/dL (ref 8–23)
CO2: 25 mmol/L (ref 22–32)
Calcium: 8.6 mg/dL — ABNORMAL LOW (ref 8.9–10.3)
Chloride: 102 mmol/L (ref 98–111)
Creatinine, Ser: 1.01 mg/dL (ref 0.61–1.24)
GFR, Estimated: >60 mL/min (ref >60)
Glucose, Bld: 98 mg/dL (ref 70–99)
Potassium: 4 mmol/L (ref 3.5–5.1)
Sodium: 136 mmol/L (ref 135–145)
Total Bilirubin: 0.7 mg/dL (ref 0.0–1.2)
Total Protein: 6.2 g/dL — ABNORMAL LOW (ref 6.5–8.1)

## 2024-09-30 NOTE — Progress Notes (Signed)
 Patient ID: Richard Vaughn, male   DOB: Nov 03, 1951, 73 y.o.   MRN: [id number] Patient seen and examined.  He fell out of bed.  He has no new headache or visual changes or numbness or tingling or weakness.  Clinically he looks good without neurologic deficit.  They came in for precautionary reasons.  Head CT looks wholly unchanged to me.  He is scheduled with Dr. Lanis for an MMA embolization on Tuesday.  We feel he is safe for discharge home with plans to follow-up on Tuesday with Dr. Lanis for scheduled treatment of the subdural hematoma.

## 2024-09-30 NOTE — ED Provider Notes (Signed)
 Pt signed out by Dr. Hunter pending NS eval.  Pt was seen by Dr. Joshua (NS).  He felt like pt was ok to go home and f/u with Dr. Lanis for his MMA embolization on Tuesday.  Pt and family are good with this plan.  Pt is stable for d/c.  Return if worse.    Dean Clarity, MD 09/30/24 [phone]

## 2024-09-30 NOTE — ED Triage Notes (Addendum)
 Patient arrives via [facility] for mechanical fall out of bed. + head trauma. Known subdural bleed. No changes in mentation per ems.

## 2024-10-01 ENCOUNTER — Telehealth: Payer: Self-pay | Admitting: *Deleted

## 2024-10-01 NOTE — Telephone Encounter (Signed)
 Copied from CRM [id number]. Topic: General - Other >> Oct 01, 2024  4:39 PM Kevelyn M wrote: Reason for CRM: Wife and patient reported that the patient rolled out of bed and hit the floor on 09/30/2024 EMS took him [facility]. Patient received a CAT scan and was sent home.

## 2024-10-02 ENCOUNTER — Encounter (HOSPITAL_COMMUNITY): Admission: RE | Payer: Self-pay | Source: Home / Self Care

## 2024-10-02 ENCOUNTER — Encounter (HOSPITAL_COMMUNITY): Payer: Self-pay | Admitting: Certified Registered"

## 2024-10-02 ENCOUNTER — Inpatient Hospital Stay (HOSPITAL_COMMUNITY): Admission: RE | Admit: 2024-10-02 | Source: Home / Self Care | Admitting: Neurosurgery

## 2024-10-02 ENCOUNTER — Inpatient Hospital Stay (HOSPITAL_COMMUNITY)

## 2024-10-02 ENCOUNTER — Encounter (HOSPITAL_COMMUNITY): Payer: Self-pay | Admitting: Vascular Surgery

## 2024-10-02 SURGERY — RADIOLOGY WITH ANESTHESIA
Anesthesia: General

## 2024-10-04 ENCOUNTER — Ambulatory Visit: Admitting: Podiatry

## 2024-10-05 ENCOUNTER — Ambulatory Visit (INDEPENDENT_AMBULATORY_CARE_PROVIDER_SITE_OTHER): Admitting: Family Medicine

## 2024-10-05 VITALS — BP 124/78 | HR 82 | Temp 98.2°F | Resp 16 | Ht 74.0 in | Wt 242.0 lb

## 2024-10-05 DIAGNOSIS — S065XAA Traumatic subdural hemorrhage with loss of consciousness status unknown, initial encounter: Secondary | ICD-10-CM

## 2024-10-05 DIAGNOSIS — I10 Essential (primary) hypertension: Secondary | ICD-10-CM | POA: Diagnosis not present

## 2024-10-05 DIAGNOSIS — R296 Repeated falls: Secondary | ICD-10-CM

## 2024-10-05 NOTE — Patient Instructions (Addendum)
 A few things to remember from today's visit:  Essential hypertension  Subdural hematoma (HCC)  Keep appt with neurosurgeon and neurologist. No changes today.  If you need refills for medications you take chronically, please call your pharmacy. Do not use My Chart to request refills or for acute issues that need immediate attention. If you send a my chart message, it may take a few days to be addressed, specially if I am not in the office.  Please be sure medication list is accurate. If a new problem present, please set up appointment sooner than planned today.

## 2024-10-05 NOTE — Assessment & Plan Note (Signed)
 BP adequately controlled. Continue olmesartan  20 mg daily and low-salt diet. Continue monitoring BP at home.

## 2024-10-05 NOTE — Progress Notes (Signed)
 Chief Complaint  Patient presents with   Follow-up    ED follow up     Richard Vaughn is a 73 y.o. male,  Discussed the use of AI scribe software for clinical note transcription with the patient, who gave verbal consent to proceed.  History of Present Illness Richard Vaughn is a 73 year old male who is here today a PMHx significant for insomnia, chronic pain, HTN, OSA, GERD, HLD, CVA, and depression to follow on recent ED visit. Here today with his wife.  Evaluated in the ED on 09/30/2024 for a fall. He experienced another fall, fell out of bed with head trauma. Head CT scan revealed stable subdural hematoma, it was deemed safe to be discharged home and to follow with neurosurgeon as out pt.  He was last seen on 08/24/24 for hospital follow up,  subdural hematoma. 1. Left frontoparietal convexity acute on subacute subdural hematoma, unchanged in maximal thickness at 12 mm but with increased acute hyperdense blood products. 2. Continued 3 mm right-to-left midline shift at the foramen of Monro. 3. No other acute intracranial hemorrhage. 4. No cortical-based acute infarct. 5. Moderate to severe chronic small vessel ischemic disease with chronic lacunar infarcts and mild cerebral atrophy. 6. PRA is attempting to reach the ordering physician for stat notification at the time of signing.  He was scheduled for pipeline embolization for middle meningeal artery for 10/02/24 but wife asked to be postponed due to concerns about possible complications. He has an appt with Dr Lanis in 10 days and then new date for procedure will be determined. His wife has been considering establishing with a new neurosurgeon.  CVA with residual hemiparesis and slurred speech. Aspirin  has been held and he is on Lovastatin  20 mg daily.  He was having episodes of tachycardia dn diaphoresis, both his wife thinks were related to subdural hematoma.  Tachycardic episodes seems to be exacerbated by prolonged  physical activity, not longer having associated diaphoresis. Negative fro associated headache, CP,SOB,or new focal deficit. No MS changes have been noted.  HTN on Olmesartan  20 mg daily.  Lab Results  Component Value Date   NA 136 09/30/2024   CL 102 09/30/2024   K 4.0 09/30/2024   CO2 25 09/30/2024   BUN 12 09/30/2024   CREATININE 1.01 09/30/2024   GFRNONAA >60 09/30/2024   CALCIUM  8.6 (L) 09/30/2024   ALBUMIN 3.1 (L) 09/30/2024   GLUCOSE 98 09/30/2024   Lab Results  Component Value Date   WBC 8.1 09/30/2024   HGB 13.7 09/30/2024   HCT 41.3 09/30/2024   MCV 91.8 09/30/2024   PLT 183 09/30/2024   Review of Systems  Constitutional:  Negative for activity change, appetite change, chills and fever.  HENT:  Negative for sore throat.   Respiratory:  Negative for cough and wheezing.   Gastrointestinal:  Negative for abdominal pain and nausea.  Genitourinary:  Negative for decreased urine volume, dysuria and hematuria.  Skin:  Negative for rash.  Neurological:  Negative for syncope.  Psychiatric/Behavioral:  Negative for confusion and hallucinations.   See other pertinent positives and negatives in HPI.  Current Outpatient Medications on File Prior to Visit  Medication Sig Dispense Refill   docusate sodium  (COLACE) 100 MG capsule Take 100 mg by mouth at bedtime.     DULoxetine  (CYMBALTA ) 60 MG capsule TAKE 1 CAPSULE BY MOUTH EVERY DAY 90 capsule 3   esomeprazole  (NEXIUM ) 40 MG capsule TAKE 1 CAPSULE (40 MG TOTAL) BY MOUTH  2 (TWO) TIMES DAILY BEFORE A MEAL. 180 capsule 1   lovastatin  (MEVACOR ) 20 MG tablet TAKE 1 TABLET BY MOUTH EVERYDAY AT BEDTIME 90 tablet 1   Melatonin 10 MG TABS Take 20 mg by mouth at bedtime as needed (for sleeo).     Multiple Vitamin (MULTIVITAMIN WITH MINERALS) TABS tablet Take 1 tablet by mouth daily.     olmesartan  (BENICAR ) 20 MG tablet TAKE 1 TABLET BY MOUTH EVERY DAY 90 tablet 3   Oxycodone  HCl 10 MG TABS Take 1 tablet (10 mg total) by mouth 2 (two)  times daily. 60 tablet 0   traZODone  (DESYREL ) 50 MG tablet TAKE 1 TABLET BY MOUTH EVERYDAY AT BEDTIME 90 tablet 2   Vibegron (GEMTESA) 75 MG TABS      [Paused] aspirin  EC 81 MG tablet Take 1 tablet (81 mg total) by mouth daily. Swallow whole. 30 tablet 11   No current facility-administered medications on file prior to visit.    Past Medical History:  Diagnosis Date   Barrett's esophagus    Cerebrovascular accident (CVA) due to thrombosis of left carotid artery (HCC) 09/23/2015   Chronic back pain    CVA (cerebral vascular accident) (HCC) 09/22/2016   Depression    Gastritis    Mild   Gastroparesis    GERD (gastroesophageal reflux disease)    Headache    History of kidney stones    Hyperglycemia    Hypertension    ICH (intracerebral hemorrhage) (HCC) - R thalmic/PLIC d/t HTN 93/98/7980   Lacunar infarct, acute (HCC) 08/25/2015   Nephrolithiasis    Pneumonia    Covid pneumonia   Renal cyst    Sleep apnea    Stricture and stenosis of esophagus    Stroke (HCC) 07/2015   Stroke-like symptom 09/22/2016   Treatment-emergent central sleep apnea 10/25/2018   Vision abnormalities    No Known Allergies  Social History   Socioeconomic History   Marital status: Married    Spouse name: Richard Vaughn   Number of children: 3   Years of education: Not on file   Highest education level: 12th grade  Occupational History   Occupation: retired    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[employer]  Tobacco Use   Smoking status: Former    Current packs/day: 0.00    Average packs/day: 0.5 packs/day for 30.0 years (15.0 ttl pk-yrs)    Types: Cigarettes    Start date: 04/15/1988    Quit date: 04/15/2018    Years since quitting: 6.4   Smokeless tobacco: Never  Vaping Use   Vaping status: Never Used  Substance and Sexual Activity   Alcohol use: No    Alcohol/week: 0.0 standard drinks of alcohol   Drug use: Never    Comment: last use 04/2018   Sexual activity: Yes  Other Topics Concern   Not on file  Social  History Narrative   Not on file   Social Drivers of Health   Financial Resource Strain: Low Risk  (10/05/2024)   Overall Financial Resource Strain (CARDIA)    Difficulty of Paying Living Expenses: Not hard at all  Food Insecurity: No Food Insecurity (10/05/2024)   Hunger Vital Sign    Worried About Running Out of Food in the Last Year: Never true    Ran Out of Food in the Last Year: Never true  Transportation Needs: No Transportation Needs (10/05/2024)   PRAPARE - Administrator, Civil Service (Medical): No    Lack of Transportation (Non-Medical): No  Physical Activity: Inactive (10/05/2024)   Exercise Vital Sign    Days of Exercise per Week: 0 days    Minutes of Exercise per Session: Not on file  Stress: No Stress Concern Present (10/05/2024)   [employer] of Occupational Health - Occupational Stress Questionnaire    Feeling of Stress: Not at all  Social Connections: Moderately Isolated (10/05/2024)   Social Connection and Isolation Panel    Frequency of Communication with Friends and Family: Twice a week    Frequency of Social Gatherings with Friends and Family: Once a week    Attends Religious Services: Never    Database Administrator or Organizations: No    Attends Engineer, Structural: Not on file    Marital Status: Married    Vitals:   10/05/24 1114  BP: 124/78  Pulse: 82  Resp: 16  Temp: 98.2 F (36.8 C)  SpO2: 95%   Body mass index is 31.07 kg/m.  Physical Exam Vitals and nursing note reviewed.  Constitutional:      General: He is not in acute distress.    Appearance: He is well-developed.  HENT:     Head: Normocephalic and atraumatic.     Mouth/Throat:     Mouth: Mucous membranes are moist.  Eyes:     Conjunctiva/sclera: Conjunctivae normal.  Cardiovascular:     Rate and Rhythm: Normal rate and regular rhythm.     Heart sounds: No murmur heard. Pulmonary:     Effort: Pulmonary effort is normal. No respiratory distress.      Breath sounds: Normal breath sounds.  Abdominal:     Palpations: Abdomen is soft.     Tenderness: There is no abdominal tenderness.  Musculoskeletal:     Right lower leg: No edema.     Left lower leg: No edema.  Skin:    General: Skin is warm.     Findings: No erythema or rash.  Neurological:     Mental Status: He is alert. Mental status is at baseline.     Comments: Left-sided weakness and slurred speech residual from CVA. In a wheel chair.  Psychiatric:        Mood and Affect: Mood and affect normal.   ASSESSMENT AND PLAN:  Mr. Christmas was seen today to follow on recent ED visit. Diagnoses and all orders for this visit:  Subdural hematoma (HCC) Last head CT done on 09/30/2024, which was reported as stable. Surgical procedure was canceled and has not been rescheduled. He has an appointment with the neurosurgeon in about 10 days. He continues being out from Aspirin . Fall precautions to continue. Clearly instructed about warning signs.  There is a preop form on my dest. Chronic conditions are stable and at this time benefits are greater that risk. Continue holding on Aspirin . Form will be completed and faxed to Dr Valeda office.  Essential hypertension Assessment & Plan: BP adequately controlled. Continue olmesartan  20 mg daily and low-salt diet. Continue monitoring BP at home.  Frequent falls  Fall precautions discussed. For now avoid prolonged walking or exercise until he undergoes vascular procedure. PT can be resumed after surgery.   Return if symptoms worsen or fail to improve, for keep next appointment.  Edison Wollschlager G. Annjeanette Sarwar, MD  [facility]. [place] office.

## 2024-10-08 ENCOUNTER — Ambulatory Visit: Payer: Medicare Other | Admitting: Adult Health

## 2024-10-09 NOTE — ED Provider Notes (Signed)
 Emergency Department Provider Note  TRIAGE NOTE: Patient arrives via [facility] for mechanical fall out of bed. + head trauma. Known subdural bleed. No changes in mentation per ems.   HISTORY  Chief Complaint Fall   HPI Richard Vaughn is a 73 y.o. male with   a fall resulting in a subdural hematoma and hip impact. The fall occurred when he rolled out of bed while trying to sleep. He denies any new pain or other injuries. The patient has a history of left-sided deficits and speech difficulties secondary to prior strokes, which are reported to be at his baseline. He is not currently on blood thinners. The patient resides at home. History was obtained from the patient and his wife.  PMH Past Medical History:  Diagnosis Date   Barrett's esophagus    Cerebrovascular accident (CVA) due to thrombosis of left carotid artery (HCC) 09/23/2015   Chronic back pain    CVA (cerebral vascular accident) (HCC) 09/22/2016   Depression    Gastritis    Mild   Gastroparesis    GERD (gastroesophageal reflux disease)    Headache    History of kidney stones    Hyperglycemia    Hypertension    ICH (intracerebral hemorrhage) (HCC) - R thalmic/PLIC d/t HTN 93/98/7980   Lacunar infarct, acute (HCC) 08/25/2015   Nephrolithiasis    Pneumonia    Covid pneumonia   Renal cyst    Sleep apnea    Stricture and stenosis of esophagus    Stroke (HCC) 07/2015   Stroke-like symptom 09/22/2016   Treatment-emergent central sleep apnea 10/25/2018   Vision abnormalities     Home Medications Prior to Admission medications   Medication Sig Start Date End Date Taking? Authorizing Provider  aspirin  EC 81 MG tablet Take 1 tablet (81 mg total) by mouth daily. Swallow whole. 06/10/20   Whitfield [name], NP  docusate sodium  (COLACE) 100 MG capsule Take 100 mg by mouth at bedtime.    [provider]  DULoxetine  (CYMBALTA ) 60 MG capsule TAKE 1 CAPSULE BY MOUTH EVERY DAY 01/02/24   Jordan, [name], MD  esomeprazole   (NEXIUM ) 40 MG capsule TAKE 1 CAPSULE (40 MG TOTAL) BY MOUTH 2 (TWO) TIMES DAILY BEFORE A MEAL. 08/06/24   [name], PA-C  lovastatin  (MEVACOR ) 20 MG tablet TAKE 1 TABLET BY MOUTH EVERYDAY AT BEDTIME 09/24/24   Jordan, [name], MD  Melatonin 10 MG TABS Take 20 mg by mouth at bedtime as needed (for sleeo).    [provider]  Multiple Vitamin (MULTIVITAMIN WITH MINERALS) TABS tablet Take 1 tablet by mouth daily.    [provider]  olmesartan  (BENICAR ) 20 MG tablet TAKE 1 TABLET BY MOUTH EVERY DAY 01/02/24   Jordan, [name], MD  Oxycodone  HCl 10 MG TABS Take 1 tablet (10 mg total) by mouth 2 (two) times daily. 09/13/24   Jordan, [name], MD  traZODone  (DESYREL ) 50 MG tablet TAKE 1 TABLET BY MOUTH EVERYDAY AT BEDTIME 03/28/24   Jordan, [name], MD  Vibegron [facility]) 75 MG TABS  08/24/24   [provider]    Social History Social History   Tobacco Use   Smoking status: Former    Current packs/day: 0.00    Average packs/day: 0.5 packs/day for 30.0 years (15.0 ttl pk-yrs)    Types: Cigarettes    Start date: 04/15/1988    Quit date: 04/15/2018    Years since quitting: 6.4   Smokeless tobacco: Never  Vaping Use  Vaping status: Never Used  Substance Use Topics   Alcohol use: No    Alcohol/week: 0.0 standard drinks of alcohol   Drug use: Never    Comment: last use 04/2018    Review of Systems: Documented in HPI ____________________________________________  PHYSICAL EXAM: VITAL SIGNS: Triage: Blood pressure (!) 134/92, pulse 73, temperature 97.9 F (36.6 C), temperature source Oral, resp. rate 16, SpO2 97%.  Vitals:   09/30/24 0430 09/30/24 0809 09/30/24 0944  BP: (!) 133/91 (!) 124/96 (!) 134/92  Pulse: 81 69 73  Resp: 16 16 16   Temp: 98.2 F (36.8 C) 97.8 F (36.6 C) 97.9 F (36.6 C)  TempSrc:  Oral Oral  SpO2: 96% 97% 97%    Physical Exam Vitals and nursing note reviewed.  Constitutional:      Appearance: He is well-developed.  HENT:      Head: Normocephalic and atraumatic.  Cardiovascular:     Rate and Rhythm: Normal rate.  Pulmonary:     Effort: Pulmonary effort is normal. No respiratory distress.  Abdominal:     General: There is no distension.  Musculoskeletal:        General: Normal range of motion.     Cervical back: Normal range of motion.  Neurological:     Mental Status: He is alert. Mental status is at baseline.     Cranial Nerves: Dysarthria present.       ____________________________________________   LABS (all labs ordered are listed, but only abnormal results are displayed)  Labs Reviewed  COMPREHENSIVE METABOLIC PANEL WITH GFR - Abnormal; Notable for the following components:      Result Value   Calcium  8.6 (*)    Total Protein 6.2 (*)    Albumin 3.1 (*)    AST 13 (*)    All other components within normal limits  CBC WITH DIFFERENTIAL/PLATELET   ____________________________________________  EKG   EKG Interpretation Date/Time:    Ventricular Rate:    PR Interval:    QRS Duration:    QT Interval:    QTC Calculation:   R Axis:      Text Interpretation:          ____________________________________________  RADIOLOGY  No results found. ____________________________________________  PROCEDURES  Procedure(s) performed:   Procedures ____________________________________________  INITIAL IMPRESSION / ASSESSMENT AND PLAN       Images ordered viewed and obtained by myself. Agree with Radiology interpretation. Details in ED course.  Labs ordered reviewed by myself as detailed in ED course.  Consultations obtained/considered detailed in ED course.    CRITICAL INTERVENTIONS:  N/AA  CT shows chronic subdural however does have some acute bleeding.  Overall consistent with similar size and midline shift to previous CT scans however once again some of his new blood.  Discussed with neurosurgery who will see patient for further recommendations.  Care transferred pending  neurosurgical consultation, reevaluation and disposition.  Note:  This note was prepared with assistance of Dragon voice recognition software. Occasional wrong-word or sound-a-like substitutions may have occurred due to the inherent limitations of voice recognition software.    Melbert Botelho, Selinda, MD 10/09/24 [phone]

## 2024-10-16 ENCOUNTER — Other Ambulatory Visit: Payer: Self-pay | Admitting: Neurosurgery

## 2024-10-16 ENCOUNTER — Emergency Department (HOSPITAL_BASED_OUTPATIENT_CLINIC_OR_DEPARTMENT_OTHER)
Admission: EM | Admit: 2024-10-16 | Discharge: 2024-10-16 | Disposition: A | Discharge status: Home / Self Care | Attending: Emergency Medicine | Admitting: Emergency Medicine

## 2024-10-16 ENCOUNTER — Emergency Department (HOSPITAL_BASED_OUTPATIENT_CLINIC_OR_DEPARTMENT_OTHER)

## 2024-10-16 ENCOUNTER — Other Ambulatory Visit: Payer: Self-pay

## 2024-10-16 DIAGNOSIS — R4701 Aphasia: Secondary | ICD-10-CM | POA: Insufficient documentation

## 2024-10-16 LAB — COMPREHENSIVE METABOLIC PANEL WITH GFR
ALT: 15 U/L (ref 0–44)
AST: 15 U/L (ref 15–41)
Albumin: 4.2 g/dL (ref 3.5–5.0)
Alkaline Phosphatase: 130 U/L — ABNORMAL HIGH (ref 38–126)
Anion gap: 11 (ref 5–15)
BUN: 10 mg/dL (ref 8–23)
CO2: 25 mmol/L (ref 22–32)
Calcium: 9.2 mg/dL (ref 8.9–10.3)
Chloride: 103 mmol/L (ref 98–111)
Creatinine, Ser: 1.01 mg/dL (ref 0.61–1.24)
GFR, Estimated: >60 mL/min (ref >60)
Glucose, Bld: 92 mg/dL (ref 70–99)
Potassium: 3.7 mmol/L (ref 3.5–5.1)
Sodium: 139 mmol/L (ref 135–145)
Total Bilirubin: 0.5 mg/dL (ref 0.0–1.2)
Total Protein: 7.3 g/dL (ref 6.5–8.1)

## 2024-10-16 LAB — APTT: aPTT: 31 s (ref 24–36)

## 2024-10-16 LAB — DIFFERENTIAL
Abs Immature Granulocytes: 0.01 K/uL (ref 0.00–0.07)
Basophils Absolute: 0 K/uL (ref 0.0–0.1)
Basophils Relative: 1 %
Eosinophils Absolute: 0.1 K/uL (ref 0.0–0.5)
Eosinophils Relative: 2 %
Immature Granulocytes: 0 %
Lymphocytes Relative: 26 %
Lymphs Abs: 1.9 K/uL (ref 0.7–4.0)
Monocytes Absolute: 0.7 K/uL (ref 0.1–1.0)
Monocytes Relative: 9 %
Neutro Abs: 4.7 K/uL (ref 1.7–7.7)
Neutrophils Relative %: 62 %

## 2024-10-16 LAB — CBC
HCT: 44.3 % (ref 39.0–52.0)
Hemoglobin: 14.8 g/dL (ref 13.0–17.0)
MCH: 29.5 pg (ref 26.0–34.0)
MCHC: 33.4 g/dL (ref 30.0–36.0)
MCV: 88.4 fL (ref 80.0–100.0)
Platelets: 207 K/uL (ref 150–400)
RBC: 5.01 MIL/uL (ref 4.22–5.81)
RDW: 13 % (ref 11.5–15.5)
WBC: 7.4 K/uL (ref 4.0–10.5)
nRBC: 0 % (ref 0.0–0.2)

## 2024-10-16 LAB — PROTIME-INR
INR: 1 (ref 0.8–1.2)
Prothrombin Time: 14 s (ref 11.4–15.2)

## 2024-10-16 LAB — CBG MONITORING, ED: Glucose-Capillary: 94 mg/dL (ref 70–99)

## 2024-10-16 LAB — ETHANOL: Alcohol, Ethyl (B): <15 mg/dL (ref <15)

## 2024-10-16 MED ORDER — LEVETIRACETAM 500 MG PO TABS: 500.0000 mg | ORAL_TABLET | Freq: Two times a day (BID) | ORAL | 0 refills | Status: AC

## 2024-10-16 MED ORDER — LEVETIRACETAM (KEPPRA) 500 MG/5 ML ADULT IV PUSH
1000.0000 mg | Freq: Once | INTRAVENOUS | Status: AC
  Administered 2024-10-16: 1000 mg via INTRAVENOUS
  Filled 2024-10-16: qty 10

## 2024-10-16 MED ORDER — LEVETIRACETAM 500 MG PO TABS: 500.0000 mg | ORAL_TABLET | Freq: Two times a day (BID) | ORAL | 0 refills | Status: DC

## 2024-10-16 MED ORDER — SODIUM CHLORIDE 0.9% FLUSH
3.0000 mL | Freq: Once | INTRAVENOUS | Status: AC
  Administered 2024-10-16: 3 mL via INTRAVENOUS

## 2024-10-16 MED ORDER — IOHEXOL 350 MG/ML SOLN
100.0000 mL | Freq: Once | INTRAVENOUS | Status: AC | PRN
  Administered 2024-10-16: 75 mL via INTRAVENOUS

## 2024-10-16 NOTE — ED Notes (Addendum)
 SABRA

## 2024-10-16 NOTE — Discharge Instructions (Signed)
 As was planned with Dr. Rosemarie, you can be discharged home and should take Keppra 500 mg twice daily. Follow up with Dr. Rosemarie in the office to arrange the recommended studies outpatient. If you develop recurrent or worsening symptoms, or have new symptoms of concern, please return to the ED immediately.

## 2024-10-16 NOTE — ED Notes (Signed)
 Patient unable to provide urine sample at this time. Patient attempted but was unable.

## 2024-10-16 NOTE — ED Triage Notes (Signed)
 Wife states patient was unable to get seatbelt on with asphasia  approx 20 mins pta. LKN 1700.   Hx of embolic and hemorrhagic stroke.   Provider at bedside during triage.

## 2024-10-16 NOTE — ED Notes (Signed)
 Patient transported to CT

## 2024-10-16 NOTE — ED Notes (Signed)
 Ct called and notified of code stroke.

## 2024-10-16 NOTE — Progress Notes (Signed)
 [facility] Telemedicine Consult   Requesting Provider: Jenne Paris, PA-c Consult Participants: patient, bedside RN Location of the provider: [facility] Location of the patient: [facility]  This consult was provided via telemedicine with 2-way video and audio communication. The patient/family was informed that care would be provided in this way and agreed to receive care in this manner.    Chief Complaint: Stroke and speech difficulties  HPI: [name] is a 73 year old African-American male with past medical history of hyperlipidemia, vision abnormalities, hyperglycemia, Barrett's esophagus, multiple lacunar infarcts, prior small hypertensive bleeds in bilateral deep gray matter structures with most recent ICH in the right thalamus/posterior limb of internal capsule in 2019 with mild residual left-sided weakness  , frequent falls and recent subdural hemorrhage who was noted to sudden onset of speech difficulties at 5 PM.   Patient had trouble putting on his seatbelt in his car and following directions.  By the time they reached home he had trouble expressing himself and completing sentences.  His wife called 911 and patient's speech and mental status appeared to be improving since his arrival here.  He denies any headache, vision difficulties, focal extremity weakness or numbness.  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  Vascular risk factors of age, peripheral arterial disease, tobacco abuse, history of substance abuse and prior strokes.  LKW: 5 pm tnk given?: No, acute Subdural on Ct IR Thrombectomy? No, no evidence on CTA Modified Rankin Scale: 2-Slight disability-UNABLE to perform all activities but does not need assistance Time of teleneurologist evaluation: 1720  Exam: There were no vitals filed for this visit.  General: Pleasant elderly African-American male not in  distress. Patient is awake alert.  Oriented to time place and situation.  Speech is hesitant and slightly nonfluent.  Mild dysarthria but can be understood with some difficulty.  Follows commands well. Extraocular movements are full range without nystagmus.  Visual fields are full to bedside confrontation testing.  Mild left lower facial asymmetry.  Tongue midline. Motor system exam spastic left hemiparesis with mild left upper and lower extremity drift.  Weakness of left grip intrinsic hand muscles.  Mild left finger-to-nose and knee to heel ataxia.  Sensation intact bilaterally. Gait not tested 1A: Level of Consciousness - 0 1B: Ask Month and Age - 0 1C: 'Blink Eyes' & 'Squeeze Hands' - 0 2: Test Horizontal Extraocular Movements - 0 3: Test Visual Fields - 0 4: Test Facial Palsy - 1 5A: Test Left Arm Motor Drift - 0 5B: Test Right Arm Motor Drift - 0 6A: Test Left Leg Motor Drift - 1 6B: Test Right Leg Motor Drift - 0 7: Test Limb Ataxia - 2 8: Test Sensation - 0 9: Test Language/Aphasia- 1 10: Test Dysarthria - 1 11: Test Extinction/Inattention - 0 NIHSS score: 4   Imaging Reviewed: CT head no acute abnormality.  Slightly decreased size of the left cerebral convexity chronic subdural hematoma.  Aspect score of 10. CT angiogram of the brain and neck: Mild clinical impression no LVO.  Official report pending Labs reviewed in epic and pertinent values follow: Pending   Assessment: 73 year old African-American male with sudden onset of transient confusion speech and word finding difficulties which appear to be improving.  Possibilities include complex partial seizures given history of subdural hematoma versus left hemispheric TIA. Patient is not candidate for thrombolysis due to subdural hematoma and minimum and resolving symptoms. Patient is not a candidate for  thrombectomy due to near resolution of deficits and no LVO  Recommendations: IV Keppra 1 g loading dose followed by 500 mg  twice daily.  Patient is not willing for admission and further evaluation with EEG and MRI scan which can be arranged later as an outpatient.  Patient advised to return back to the ER if he has recurrent episodes or worsening symptoms.  He and his wife acknowledged understanding.  Long discussion with patient and wife and answered questions.  Discussed with Dr. Emil ER MD and ER PA.    I personally spent a total of 55 minutes in the care of the patient today including getting/reviewing separately obtained history, performing a medically appropriate exam/evaluation, counseling and educating, placing orders, referring and communicating with other health care professionals, documenting clinical information in the EHR, independently interpreting results, and coordinating care.            Richard Popp, MD [facility] [phone]  If 7pm- 7am, please page neurology on call as listed in AMION.

## 2024-10-16 NOTE — ED Provider Notes (Signed)
 [place] EMERGENCY DEPARTMENT AT [facility] Provider Note   CSN: [id number] Arrival date & time: 10/16/24  1708  An emergency department physician performed an initial assessment on this suspected stroke patient at 1714.  Patient presents with: Stroke Like Symptoms   Richard Vaughn is a 73 y.o. male.   Patient to ED with wife reporting a sudden onset of difficulty speaking and unable to coordinate putting on his seatbelt. Wife reports he was able to walk to the car per his usual mobility, but once in the car he could not speak or fasten his belt. History of embolic as well as hemorrhagic strokes,currently with SDH that is scheduled for procedure this week. The patient answering yes and no denies pain, difficulty breathing, headache, nausea.         Prior to Admission medications   Medication Sig Start Date End Date Taking? Authorizing Provider  levETIRAcetam (KEPPRA) 500 MG tablet Take 1 tablet (500 mg total) by mouth 2 (two) times daily. 10/16/24  Yes Odell Balls, PA-C  aspirin  EC 81 MG tablet Take 1 tablet (81 mg total) by mouth daily. Swallow whole. 06/10/20   Whitfield [name], NP  docusate sodium  (COLACE) 100 MG capsule Take 100 mg by mouth at bedtime.    [provider]  DULoxetine  (CYMBALTA ) 60 MG capsule TAKE 1 CAPSULE BY MOUTH EVERY DAY 01/02/24   Jordan, [name], MD  esomeprazole  (NEXIUM ) 40 MG capsule TAKE 1 CAPSULE (40 MG TOTAL) BY MOUTH 2 (TWO) TIMES DAILY BEFORE A MEAL. 08/06/24   [name], PA-C  lovastatin  (MEVACOR ) 20 MG tablet TAKE 1 TABLET BY MOUTH EVERYDAY AT BEDTIME 09/24/24   Jordan, [name], MD  Melatonin 10 MG TABS Take 20 mg by mouth at bedtime as needed (for sleeo).    [provider]  Multiple Vitamin (MULTIVITAMIN WITH MINERALS) TABS tablet Take 1 tablet by mouth daily.    [provider]  olmesartan  (BENICAR ) 20 MG tablet TAKE 1 TABLET BY MOUTH EVERY DAY 01/02/24   Jordan, [name], MD  Oxycodone  HCl 10 MG TABS Take  1 tablet (10 mg total) by mouth 2 (two) times daily. 09/13/24   Jordan, [name], MD  traZODone  (DESYREL ) 50 MG tablet TAKE 1 TABLET BY MOUTH EVERYDAY AT BEDTIME 03/28/24   Jordan, [name], MD  Vibegron [facility]) 75 MG TABS  08/24/24   [provider]    Allergies: Patient has no known allergies.    Review of Systems  Updated Vital Signs BP (!) 134/91   Pulse 99   Temp 98.5 F (36.9 C) (Oral)   Resp 17   SpO2 96%   Physical Exam Vitals and nursing note reviewed.  Constitutional:      Appearance: Normal appearance.  HENT:     Head: Atraumatic.  Neck:     Vascular: No carotid bruit.  Cardiovascular:     Rate and Rhythm: Normal rate and regular rhythm.  Pulmonary:     Effort: Pulmonary effort is normal.     Breath sounds: No wheezing, rhonchi or rales.  Abdominal:     Palpations: Abdomen is soft.  Musculoskeletal:     Cervical back: Normal range of motion and neck supple.  Skin:    General: Skin is warm and dry.  Neurological:     Mental Status: He is alert.     Comments: Aphasia present. Good eye contact. No lateralizing weakness appreciated. Patellar reflexes equal. Shuffling gait, baseline per wife.      (  all labs ordered are listed, but only abnormal results are displayed) Labs Reviewed  COMPREHENSIVE METABOLIC PANEL WITH GFR - Abnormal; Notable for the following components:      Result Value   Alkaline Phosphatase 130 (*)    All other components within normal limits  PROTIME-INR  APTT  CBC  DIFFERENTIAL  ETHANOL  URINALYSIS, ROUTINE W REFLEX MICROSCOPIC  CBG MONITORING, ED    EKG: None  Radiology: CT ANGIO HEAD NECK W WO CM (CODE STROKE) Result Date: 10/16/2024 EXAM: CTA HEAD AND NECK WITH AND WITHOUT 10/16/2024 05:35:11 PM TECHNIQUE: CTA of the head and neck was performed with and without the administration of 75 mL of intravenous iohexol  (OMNIPAQUE ) 350 MG/ML injection. Multiplanar 2D and/or 3D reformatted images are provided for review.  Automated exposure control, iterative reconstruction, and/or weight based adjustment of the mA/kV was utilized to reduce the radiation dose to as low as reasonably achievable. Stenosis of the internal carotid arteries measured using NASCET criteria. COMPARISON: CTA head and neck 02/22/2020 CLINICAL HISTORY: Neuro deficit, acute, stroke suspected. FINDINGS: CTA NECK: AORTIC ARCH AND ARCH VESSELS: The aortic arch was incompletely imaged, including partial exclusion of the origin of the brachiocephalic artery. Moderate atherosclerosis in the proximal subclavian arteries without a significant stenosis. CERVICAL CAROTID ARTERIES: No dissection, arterial injury, or hemodynamically significant stenosis by NASCET criteria. CERVICAL VERTEBRAL ARTERIES: The right vertebral artery is strongly dominant with the left being diffusely hypoplastic. Calcified plaque at the right vertebral artery origin does not result in significant stenosis. Calcified plaque at the left vertebral origin results in at least moderate suspected stenosis, similar to the prior CTA. LUNGS AND MEDIASTINUM: Moderate centrilobular emphysema. 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 BONES: No acute abnormality. CTA HEAD: ANTERIOR CIRCULATION: Intracranial internal carotid arteries are patent with mild atherosclerosis and no significant stenosis. ACA and MCAs are patent without evidence of a proximal branch occlusion or significant proximal stenosis. POSTERIOR CIRCULATION: The intracranial vertebral arteries are widely patent to the basilar. Patent PICA and SCA origins are visualized bilaterally. The basilar artery is widely patent. Posterior communicating arteries are diminutive or absent. Both PCAs are patent without evidence of a significant proximal stenosis. OTHER: No dural venous sinus thrombosis on this non-dedicated study. These results were communicated by  telephone to Dr. Emil on 10/16/2024 at 5:44 pm. IMPRESSION: 1. No large vessel occlusion or significant intracranial stenosis. 2. Unchanged at least moderate stenosis of the origin of the nondominant left vertebral artery. 3. Wide patency of the dominant right vertebral artery and of the carotid arteries. 4. 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 5. Centrilobular emphysema; consider evaluation for eligibility for a low-dose CT lung cancer screening program given emphysema as an independent risk factor. Electronically signed by: Dasie Hamburg MD 10/16/2024 06:01 PM EST RP Workstation: [id number]   CT HEAD CODE STROKE WO CONTRAST Result Date: 10/16/2024 EXAM: CT HEAD WITHOUT CONTRAST 10/16/2024 05:24:48 PM TECHNIQUE: CT of the head was performed without the administration of intravenous contrast. Automated exposure control, iterative reconstruction, and/or weight based adjustment of the mA/kV was utilized to reduce the radiation dose to as low as reasonably achievable. COMPARISON: Head CT 09/30/2024 and MRI 06/09/2023. CLINICAL HISTORY: Neuro deficit, acute, stroke suspected. Aphasia. FINDINGS: BRAIN AND VENTRICLES: A mixed density subdural hematoma over the left cerebral convexity has overall decreased in  density and slightly decreased in size, now measuring up to 11 mm in thickness. Mild mass effect on the left cerebral hemisphere is similar to the prior CT with persistent trace rightward midline shift. No new intracranial hemorrhage, acute large territory infarct, mass, or hydrocephalus is evident. Confluent hypodensities in the cerebral white matter bilaterally are similar to the prior CT and nonspecific but compatible with severe chronic small vessel ischemic disease. Chronic lacunar infarcts are again noted in the deep cerebral white matter and deep gray nuclei. ORBITS: No acute abnormality. SINUSES: Clear paranasal  sinuses. SOFT TISSUES AND SKULL: No acute soft tissue abnormality. No skull fracture. Persistent left mastoid effusion. [place] Stroke Program Early CT Score (ASPECTS) ----- Ganglionic (caudate, IC, lentiform nucleus, insula, M1-M3): 7 Supraganglionic (M4-M6): 3 Total: 10 These results were communicated by telephone to Dr. Emil on 10/16/2024 at 5:44 pm. IMPRESSION: 1. Slightly decreased size of a left cerebral convexity subdural hematoma with similar mild mass effect and trace rightward midline shift. 2. No evidence of a new intracranial abnormality.  ASPECTS of 10. 3. Severe chronic small vessel ischemic disease. Electronically signed by: Dasie Hamburg MD 10/16/2024 05:45 PM EST RP Workstation: [id number]     .Critical Care  Performed by: Odell Balls, PA-C Authorized by: Odell Balls, PA-C   Critical care provider statement:    Critical care time (minutes):  45   Critical care was necessary to treat or prevent imminent or life-threatening deterioration of the following conditions:  CNS failure or compromise   Critical care was time spent personally by me on the following activities:  Development of treatment plan with patient or surrogate, discussions with consultants, examination of patient, interpretation of cardiac output measurements, ordering and review of laboratory studies, ordering and review of radiographic studies, pulse oximetry, re-evaluation of patient's condition and review of old charts    Medications Ordered in the ED  sodium chloride  flush (NS) 0.9 % injection 3 mL (3 mLs Intravenous Given 10/16/24 1725)  iohexol  (OMNIPAQUE ) 350 MG/ML injection 100 mL (75 mLs Intravenous Contrast Given 10/16/24 1728)  levETIRAcetam (KEPPRA) undiluted injection 1,000 mg (1,000 mg Intravenous Given 10/16/24 1849)    Clinical Course as of 10/16/24 2013  Tue Oct 16, 2024  1745 Patient arrived with 20 minutes of aphasia and loss of coordination (couldn't fasten seat belt/confused) and activated as  a code stroke. Dr. Rosemarie participating in evaluation.   Known SDH, appears smaller on today's noncontrasted scan. Not a candidate for TNK due to SDH. Patient's speech returning to baseline during stroke work up.   The patient is well known to Dr. Rosemarie as he is a clinic patient. ? Seizure, doubt acute CVA. Labs pending.  [SU]  2009 Recommendations as per Dr. Rosemarie: Recommendations: IV Keppra 1 g loading dose followed by 500 mg twice daily.  Patient is not willing for admission and further evaluation with EEG and MRI scan which can be arranged later as an outpatient.  Patient advised to return back to the ER if he has recurrent episodes or worsening symptoms.  He and his wife acknowledged understanding.  Long discussion with patient and wife and answered questions.  Discussed with Dr. Emil ER MD and ER PA.  Labs reviewed and are reassuring. Urine not collected. Per wife, he does not urinate that often, and unable to provide specimen in the ED. Loading Keppra provided as per neurology recommendation. To be discharged for outpatient follow up in office. Keppra 500 mg BID dosing provided by prescription.  [SU]  Clinical Course User Index [SU] Odell Balls, PA-C                                 Medical Decision Making Amount and/or Complexity of Data Reviewed Labs: ordered. Radiology: ordered.        Final diagnoses:  Aphasia    ED Discharge Orders          Ordered    levETIRAcetam (KEPPRA) 500 MG tablet  2 times daily        10/16/24 2011               Odell Balls, DEVONNA 10/16/24 2013    Emil Share, DO 10/16/24 2029

## 2024-10-17 ENCOUNTER — Ambulatory Visit: Payer: Self-pay

## 2024-10-17 ENCOUNTER — Encounter (HOSPITAL_COMMUNITY): Payer: Self-pay | Admitting: Neurosurgery

## 2024-10-17 ENCOUNTER — Other Ambulatory Visit: Payer: Self-pay

## 2024-10-17 NOTE — Telephone Encounter (Signed)
 Reason for Disposition . [1] MODERATE to SEVERE vomiting (e.g., 3 or more times/day) AND [2] age > 60 years  Answer Assessment - Initial Assessment Questions Pt's wife reports she believes patient is dehydrated, advised ED vs UC for IV hydration.    1. VOMITING SEVERITY: How many times have you vomited in the past 24 hours?      Deferred 2. ONSET: When did the vomiting begin?      Today 3. FLUIDS: What fluids or food have you vomited up today? Have you been able to keep any fluids down?     Pt's wife reports no 4. ABDOMEN PAIN: Are your having any abdomen pain? If Yes : How bad is it and what does it feel like? (e.g., crampy, dull, intermittent, constant)      Deferred 5. DIARRHEA: Is there any diarrhea? If Yes, ask: How many times today?      Deferred 6. CONTACTS: Is there anyone else in the family with the same symptoms?      Deferred 7. CAUSE: What do you think is causing your vomiting?     Pt's wife reports he was given Keppra in ED and contrast for CT 8. HYDRATION STATUS: Any signs of dehydration? (e.g., dry mouth [not only dry lips], too weak to stand) When did you last urinate?     Decreased urine output per wife 9. OTHER SYMPTOMS: Do you have any other symptoms? (e.g., fever, headache, vertigo, vomiting blood or coffee grounds, recent head injury)     Vomiting  Protocols used: Vomiting-A-AH

## 2024-10-17 NOTE — Anesthesia Preprocedure Evaluation (Signed)
 Anesthesia Evaluation  Patient identified by MRN, date of birth, ID band Patient awake    Reviewed: Allergy & Precautions, NPO status , Patient's Chart, lab work & pertinent test results  Airway Mallampati: II  TM Distance: >3 FB Neck ROM: Full    Dental no notable dental hx. (+) Upper Dentures   Pulmonary sleep apnea , former smoker   Pulmonary exam normal        Cardiovascular hypertension, Pt. 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), Residual Symptoms    GI/Hepatic Neg liver ROS,GERD  Medicated,,  Endo/Other  negative endocrine ROS    Renal/GU Renal disease     Musculoskeletal  (+) Arthritis , Osteoarthritis,    Abdominal Normal abdominal exam  (+)   Peds  Hematology Lab Results      Component                Value               Date                      WBC                      7.4                 10/16/2024                HGB                      14.8                10/16/2024                HCT                      44.3                10/16/2024                MCV                      88.4                10/16/2024                PLT                      207                 10/16/2024             Lab Results      Component                Value               Date                      NA                       139                 10/16/2024  K                        3.7                 10/16/2024                CO2                      25                  10/16/2024                GLUCOSE                  92                  10/16/2024                BUN                      10                  10/16/2024                CREATININE               1.01                10/16/2024                CALCIUM                   9.2                 10/16/2024                GFR                       69.40               08/07/2024                EGFR                     84                  07/13/2022                GFRNONAA                 >60                 10/16/2024              Anesthesia Other Findings mixed density SDH overlying the left cerebral hemisphere measuring up to 9 mm in thickness with mass effect on the left cerebral hemisphere with 2 mm rightward shift  Reproductive/Obstetrics                              Anesthesia Physical Anesthesia Plan  ASA: 3  Anesthesia Plan: General   Post-op Pain Management:    Induction: Intravenous  PONV Risk Score and Plan: 2 and Ondansetron , Dexamethasone  and Treatment may vary due to age or medical condition  Airway Management Planned: Mask and Oral ETT  Additional Equipment: Arterial line  Intra-op Plan:   Post-operative Plan: Extubation  in OR  Informed Consent: I have reviewed the patients History and Physical, chart, labs and discussed the procedure including the risks, benefits and alternatives for the proposed anesthesia with the patient or authorized representative who has indicated his/her understanding and acceptance.     Dental advisory given  Plan Discussed with: CRNA  Anesthesia Plan Comments: (PAT note written 10/17/2024 by Tecumseh Yeagley, PA-C.  )         Anesthesia Quick Evaluation

## 2024-10-17 NOTE — Telephone Encounter (Signed)
 FYI Only or Action Required?: FYI only for provider: ED advised.  Patient was last seen in primary care on 10/05/2024 by Jordan, [name], MD.  Called Nurse Triage reporting Vomiting.  Symptoms began yesterday.  Triage Disposition: Go to ED Now (or PCP Triage)  Patient/caregiver understands and will follow disposition?: Unsure      Copied from CRM [id number]. Topic: Clinical - Red Word Triage >> Oct 17, 2024  4:50 PM Geneva B wrote: [name] that prompted transfer to Nurse Triage:   pt wife is calling to say that pt went to er 10/16/2024 and pt had to get contrast dye and patient has not been able to keep anything down since taking the contrast dye Reason for Disposition  [1] Drinking very little AND [2] dehydration suspected (e.g., no urine > 12 hours, very dry mouth, very lightheaded)  Answer Assessment - Initial Assessment Questions This RN spoke with pt's wife, [name], after call dropped with previous nurse. Pt wife states the other nurse recommends pt goes to UC. This RN recommends pt goes to ED. Pt wife states understanding.  Pt went to ED yesterday due to neurological symptoms where pt just looked at wife when [name] asked him to fasten seatbelt. Pt felt fine but couldn't move; pt couldn't remember the name of the street they lived on  At ED they did a CT scan with contrast dye (hematoma has decreased since last time) Vomiting x2 since yesterday Not urinating much  No new neurological symptoms Pt wife has been trying to hydrate him; hasn't had a full glass of water Pt is supposed to have a procedure tmrw  Protocols used: Vomiting-A-AH

## 2024-10-17 NOTE — Progress Notes (Signed)
 SDW call  Patient's wife [name] was given pre-op instructions over the phone. She verbalized understanding of instructions provided. She denied patient having any SOB, fever or cough. States they were in the ED yesterday for stroke like symptoms, he is still occasionally having difficulty with speech   PCP - Dr. Betty Jordan Cardiologist -  Pulmonary:    PPM/ICD - denies Device Orders - na Rep Notified - na   Chest x-ray - 08/15/2024 EKG -  08/15/2024 Stress Test - ECHO - 02/23/2020 Cardiac Cath -   Sleep Study/sleep apnea/CPAP: Has sleep apnea, but does not use CPAP  Non-diabetic  Blood Thinner Instructions: denies Aspirin  Instructions:has been on hold with last dose 08/18/2024   ERAS Protcol - NPO   Anesthesia review: Yes. Isaiah reviewed 09/26/2024, patient in the ED yesterday 10/16/2024  Your procedure is scheduled on Thursday October 18, 2024  Report to [facility] Main Entrance A at  1000  A.M., then check in with the Admitting office.  Call this number if you have problems the morning of surgery:  [phone]   If you have any questions prior to your surgery date call [phone]: Open Monday-Friday 8am-4pm If you experience any cold or flu symptoms such as cough, fever, chills, shortness of breath, etc. between now and your scheduled surgery, please notify us  at the above number     Remember:  Do not eat or drink after midnight the night before your surgery  Take these medicines the morning of surgery with A SIP OF WATER:  Cymbalta , nexium , keppra, oxycodone   As of today, STOP taking any Aspirin  (unless otherwise instructed by your surgeon) Aleve, Naproxen, Ibuprofen, Motrin, Advil, Goody's, BC's, all herbal medications, fish oil, and all vitamins.

## 2024-10-17 NOTE — Telephone Encounter (Addendum)
 FYI Only or Action Required?: FYI only for provider: ED advised.  Patient was last seen in primary care on 10/05/2024 by Jordan, [name], MD.  Called Nurse Triage reporting Dehydration.  Symptoms began today.  Interventions attempted: Rest, hydration, or home remedies.  Symptoms are: unchanged.  Triage Disposition: Go to ED Now (or PCP Triage)  Patient/caregiver understands and will follow disposition?: Yes  Copied from CRM [id number]. Topic: Clinical - Red Word Triage >> Oct 17, 2024  4:50 PM Richard Vaughn wrote: [name] that prompted transfer to Nurse Triage:   pt wife is calling to say that pt went to er 10/16/2024 and pt had to get contrast dye and patient has not been able to keep anything down since taking the contrast dye

## 2024-10-17 NOTE — Progress Notes (Signed)
 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 He has known multiple prior strokes with left hemiparesis and some dysarthria. He presented to ED on 08/15/2024 with several weeks of worsening LLLE weakness, more falls, episodes of tachycardia and diaphoresis and changes in bowel and bladder control. Head CT showed mixed density SDH overlying the left cerebral hemisphere measuring up to 9 mm in thickness with mass effect on the left cerebral hemisphere with 2 mm rightward shift. He was admitted by neurosurgery until 08/18/2024 while undergoing serial imaging. EEG did not identify any seizure activity. ASA held. Discharged with close follow-up with plans for out-patient repeat imaging.   Following October 2025 discharge he had neurology and neurosurgery follow-up. A repeat CT head on 09/17/2024 showed increase in the left convexity SDH measuring 12 mm with trace rightward midline shift. Middle Meningeal artery embolization was planned  Since discharge, he had follow-up with neurosurgery and neurology. Memory difficulties were unchanged with stable score of 24/40 on Mini-Mental exam. MMA   embolization was initially scheduled for 10/02/2024, but he/wife wanted to postpone due to concerns about possible complications.   As of 10/05/2024 PCP Jordan, [name], MD, she wrote, There is a preop form on my dest. Chronic conditions are stable and at this time benefits are greater that risk. Continue holding on Aspirin . Form will be completed and faxed to Dr Valeda office. Since then he had an ED  visit on 10/16/2024 for speech difficulties and trouble putting on his seatbelt. His wife contacted 911 Symptoms, and symptoms improved by the time he was in the ED. Head CT showed slight decreased in left SDH with simialr mass effect/trace rightward shift and severe chronic small vessel ischemic disease. CTA of the head/neck showed no large vessel occlusion or significant stenosis, unchanged ~ moderate left vertebral artery stenosis, unchanged 4 cm long-standing and presumably benign mass near the left parotid gland. His primary neurologist Dr. Rosemarie was consulted. Question of complex partial seizure given SDH versus left hemispheric TIA. IV Keppra given. He declined admission, so Dr. Rosemarie said out-patient EEG and follow-up brain MRI could be arranged at a later date as out-patient. He was given strict return precautions and discharged on Keppra 500 mg BID. ASA continues to be on hold.   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   Patient with known prior strokes, SDH with some mass effect and rightward shift, and prior transient amnesia event. He had recurrent symptoms on 10/16/2024 and evaluated by Dr. Rosemarie as above. CT head and CTA head/neck overall stable. Seizure questioned and discharged on Keppra.  Dr. Lanis is aware of events with plans to proceed. Discussed with anesthesiologist Erma Simmonds, DO. Anesthesia team to evaluate on the day of surgery.     VS: Ht 6' 2 (1.88  m)   Wt 109.8 kg   BMI 31.07 kg/m  BP Readings from Last 3 Encounters:  10/16/24 (!) 134/91  10/05/24 124/78   09/30/24 (!) 134/92   Pulse Readings from Last 3 Encounters:  10/16/24 99  10/05/24 82  09/30/24 73    PROVIDERS: Jordan, [name], MD is PCP  Rosemarie Rothman, MD is neurologist    LABS: Most recent results in [facility] include: Lab Results  Component Value Date   WBC 7.4 10/16/2024   HGB 14.8 10/16/2024   HCT 44.3 10/16/2024   PLT 207 10/16/2024   GLUCOSE 92 10/16/2024   CHOL 143 12/05/2023   TRIG 93.0 12/05/2023   HDL 44.60 12/05/2023   LDLCALC 80 12/05/2023   ALT 15 10/16/2024   AST 15 10/16/2024   NA 139 10/16/2024   K 3.7 10/16/2024   CL 103 10/16/2024   CREATININE 1.01 10/16/2024   BUN 10 10/16/2024   CO2 25 10/16/2024   TSH 0.485 08/17/2024   INR 1.0 10/16/2024    OTHER: EEG 08/17/2024: IMPRESSION: - This study is within normal limits. No seizures or epileptiform discharges were seen throughout the recording. - A normal interictal EEG does not exclude the diagnosis of epilepsy.   Split-Night Titration Study 07/20/2018: IMPRESSION :  1. 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  Central Sleep Apnea   2. Normal EKG, normal oxygen saturation, no PLMs.  RECOMMENDATIONS: CPAP did reduce the AHI but central apnea will unlikely resolve completely. We will order an auto-titration CPAP device 6-10 cm water with 1 cm EPR.  2) My recommendation is to stay in non- supine sleep position,  which is difficult to achieve with a large FFM.  - Previous study 10/05/2016 noted Central Sleep Apnea at AHI 18.7/h. no prolonged desaturation, and the non-supine AHI was 3.4/h only. Primary recommendation was to avoid supine sleep     IMAGES: CTA Head/Neck 10/16/2024: IMPRESSION: 1. No large vessel occlusion or significant intracranial stenosis. 2. Unchanged at least moderate stenosis of the origin of the nondominant left vertebral artery. 3. Wide patency of the dominant right vertebral artery and of the carotid arteries. 4. Unchanged 4 cm long-standing and  presumably benign mass in the left parapharyngeal space near the deep lobe of the left parotid gland. 5. Centrilobular emphysema; consider evaluation for eligibility for a low-dose CT lung cancer screening program given emphysema as an independent risk factor.  CT Head 10/16/2024: IMPRESSION: 1. Slightly decreased size of a left cerebral convexity subdural hematoma with similar mild mass effect and trace rightward midline shift. 2. No evidence of a new intracranial abnormality.  ASPECTS of 10. 3. Severe chronic small vessel ischemic disease.  1V CXR 08/15/2024: IMPRESSION: 1. No active disease. 2. Aortic Atherosclerosis (ICD10-I70.0) and Emphysema (ICD10-J43.9).  CTA Chest 05/04/2024 (in setting of suspected aspiration PNA): IMPRESSION: 1. No evidence of pulmonary embolism to the level of distal segmental pulmonary arteries. 2. Right lower lobe consolidation and irregular consolidation within the right middle lobe, likely pneumonia. 3. Layering secretions within the trachea extending into bilateral airways with multifocal subsegmental mucous plugging. 4. Enlarged AP window lymph node, likely reactive. 5. Ascending thoracic aorta measures 4.1 cm. Recommend annual imaging followup by CTA or MRA. This recommendation follows 2010 ACCF/AHA/AATS/ACR/ASA/SCA/SCAI/SIR/STS/SVM Guidelines for the Diagnosis and Management of Patients with Thoracic Aortic Disease. Circulation. 2010; 121: [id number]-z630. Aortic aneurysm NOS (ICD10-I71.9) 6. Aortic Atherosclerosis (ICD10-I70.0) and Emphysema (ICD10-J43.9). Coronary artery calcifications. Assessment for potential risk factor modification, dietary therapy or pharmacologic therapy may  be warranted, if clinically indicated.     EKG: 08/15/2024: Sinus rhythm Abnormal R-wave progression, early transition Minimal ST elevation, inferior leads Baseline wander in lead(s) I V1 Confirmed by Lorette Mayo [phone]) on 08/16/2024 11:55:16 PM     CV: US  Carotid  11/02/2021: Summary:  - Right Carotid: The extracranial vessels were near-normal with only minimal wall thickening or plaque.  - Left Carotid: The extracranial vessels were near-normal with only minimal wall thickening or plaque.  - Vertebrals:  Bilateral vertebral arteries demonstrate antegrade flow.  - Subclavians: Normal flow hemodynamics were seen in bilateral subclavian arteries.      Echo 02/23/2020: IMPRESSIONS   1. Left ventricular ejection fraction, by estimation, is 60 to 65%. The  left ventricle has normal function. The left ventricle has no regional  wall motion abnormalities. There is moderate left ventricular hypertrophy  of the basal-septal segment. Left  ventricular diastolic parameters were normal.   2. Right ventricular systolic function is normal. The right ventricular  size is normal. Tricuspid regurgitation signal is inadequate for assessing  PA pressure.   3. The mitral valve is normal in structure. Trivial mitral valve  regurgitation. No evidence of mitral stenosis.   4. The aortic valve was not well visualized. Aortic valve regurgitation  is not visualized. Mild aortic valve sclerosis is present, with no  evidence of aortic valve stenosis.   5. The inferior vena cava is dilated in size with >50% respiratory  variability, suggesting right atrial pressure of 8 mmHg.      Nuclear stress test 09/06/2002: IMPRESSION  FIXED DEFECT IN THE ANTEROLATERAL WALL ADJACENT TO THE APEX.  NO REVERSIBLE DEFECTS ARE SEEN.  CALCULATED EJECTION FRACTION 55%.   Past Medical History:  Diagnosis Date   Barrett's esophagus    Cerebrovascular accident (CVA) due to thrombosis of left carotid artery (HCC) 09/23/2015   Chronic back pain    CVA (cerebral vascular accident) (HCC) 09/22/2016   Depression    Gastritis    Mild   Gastroparesis    GERD (gastroesophageal reflux disease)    Headache    History of kidney stones    Hyperglycemia    Hypertension    ICH (intracerebral  hemorrhage) (HCC) - R thalmic/PLIC d/t HTN 93/98/7980   Lacunar infarct, acute (HCC) 08/25/2015   Nephrolithiasis    Pneumonia    Covid pneumonia   Renal cyst    Sleep apnea    Stricture and stenosis of esophagus    Stroke (HCC) 07/2015   Stroke-like symptom 09/22/2016   Treatment-emergent central sleep apnea 10/25/2018   Vision abnormalities     Past Surgical History:  Procedure Laterality Date   CERVICAL DISC SURGERY  2012   COLONOSCOPY     LUMBAR DISC SURGERY  2005, 2010   replaced L4 and L5   SHOULDER ARTHROSCOPY WITH ROTATOR CUFF REPAIR Left 08/14/2020   Procedure: SHOULDER ARTHROSCOPY WITH ROTATOR CUFF REPAIR, SUBACROMIAL DECOMPRESSION;  Surgeon: Dozier Soulier, MD;  Location: [facility];  Service: Orthopedics;  Laterality: Left;   UPPER GASTROINTESTINAL ENDOSCOPY      MEDICATIONS: No current facility-administered medications for this encounter.    [Paused] aspirin  EC 81 MG tablet   docusate sodium  (COLACE) 100 MG capsule   DULoxetine  (CYMBALTA ) 60 MG capsule   esomeprazole  (NEXIUM ) 40 MG capsule   levETIRAcetam (KEPPRA) 500 MG tablet   lovastatin  (MEVACOR ) 20 MG tablet   Melatonin 10 MG TABS   Multiple Vitamin (MULTIVITAMIN WITH MINERALS) TABS tablet   olmesartan  (BENICAR ) 20 MG  tablet   Oxycodone  HCl 10 MG TABS   traZODone  (DESYREL ) 50 MG tablet   Vibegron (GEMTESA) 75 MG TABS    Isaiah Ruder, PA-C Surgical Short Stay/Anesthesiology [facility] Phone [phone] [facility] Phone [phone] 10/17/2024 4:09 PM

## 2024-10-18 ENCOUNTER — Encounter (HOSPITAL_COMMUNITY): Admission: RE | Disposition: A | Payer: Self-pay | Source: Home / Self Care | Attending: Neurosurgery

## 2024-10-18 ENCOUNTER — Encounter (HOSPITAL_COMMUNITY): Payer: Self-pay | Admitting: Neurosurgery

## 2024-10-18 ENCOUNTER — Inpatient Hospital Stay (HOSPITAL_COMMUNITY): Payer: Self-pay | Admitting: Certified Registered Nurse Anesthetist

## 2024-10-18 ENCOUNTER — Inpatient Hospital Stay (HOSPITAL_COMMUNITY)
Admission: RE | Admit: 2024-10-18 | Discharge: 2024-10-18 | Disposition: A | Source: Ambulatory Visit | Attending: Neurosurgery

## 2024-10-18 ENCOUNTER — Inpatient Hospital Stay (HOSPITAL_COMMUNITY)
Admission: RE | Admit: 2024-10-18 | Discharge: 2024-10-19 | DRG: 021 | Disposition: A | Attending: Neurosurgery | Admitting: Neurosurgery

## 2024-10-18 ENCOUNTER — Other Ambulatory Visit: Payer: Self-pay

## 2024-10-18 DIAGNOSIS — I10 Essential (primary) hypertension: Secondary | ICD-10-CM | POA: Diagnosis not present

## 2024-10-18 DIAGNOSIS — S065XAA Traumatic subdural hemorrhage with loss of consciousness status unknown, initial encounter: Secondary | ICD-10-CM

## 2024-10-18 DIAGNOSIS — R4701 Aphasia: Secondary | ICD-10-CM | POA: Diagnosis present

## 2024-10-18 DIAGNOSIS — I6203 Nontraumatic chronic subdural hemorrhage: Secondary | ICD-10-CM | POA: Diagnosis present

## 2024-10-18 DIAGNOSIS — Z823 Family history of stroke: Secondary | ICD-10-CM | POA: Diagnosis not present

## 2024-10-18 DIAGNOSIS — R29704 NIHSS score 4: Secondary | ICD-10-CM | POA: Diagnosis present

## 2024-10-18 DIAGNOSIS — F32A Depression, unspecified: Secondary | ICD-10-CM | POA: Diagnosis present

## 2024-10-18 DIAGNOSIS — Z8616 Personal history of COVID-19: Secondary | ICD-10-CM | POA: Diagnosis not present

## 2024-10-18 DIAGNOSIS — Z87891 Personal history of nicotine dependence: Secondary | ICD-10-CM | POA: Diagnosis not present

## 2024-10-18 DIAGNOSIS — E785 Hyperlipidemia, unspecified: Secondary | ICD-10-CM | POA: Diagnosis present

## 2024-10-18 DIAGNOSIS — Z79899 Other long term (current) drug therapy: Secondary | ICD-10-CM | POA: Diagnosis not present

## 2024-10-18 DIAGNOSIS — Z6831 Body mass index (BMI) 31.0-31.9, adult: Secondary | ICD-10-CM | POA: Diagnosis not present

## 2024-10-18 DIAGNOSIS — I2510 Atherosclerotic heart disease of native coronary artery without angina pectoris: Secondary | ICD-10-CM | POA: Diagnosis present

## 2024-10-18 DIAGNOSIS — Z8249 Family history of ischemic heart disease and other diseases of the circulatory system: Secondary | ICD-10-CM | POA: Diagnosis not present

## 2024-10-18 DIAGNOSIS — I69354 Hemiplegia and hemiparesis following cerebral infarction affecting left non-dominant side: Secondary | ICD-10-CM | POA: Diagnosis not present

## 2024-10-18 DIAGNOSIS — K219 Gastro-esophageal reflux disease without esophagitis: Secondary | ICD-10-CM | POA: Diagnosis present

## 2024-10-18 DIAGNOSIS — Z800 Family history of malignant neoplasm of digestive organs: Secondary | ICD-10-CM | POA: Diagnosis not present

## 2024-10-18 DIAGNOSIS — E66811 Obesity, class 1: Secondary | ICD-10-CM | POA: Diagnosis present

## 2024-10-18 DIAGNOSIS — G894 Chronic pain syndrome: Secondary | ICD-10-CM | POA: Diagnosis present

## 2024-10-18 DIAGNOSIS — Z7982 Long term (current) use of aspirin: Secondary | ICD-10-CM | POA: Diagnosis not present

## 2024-10-18 DIAGNOSIS — J9601 Acute respiratory failure with hypoxia: Secondary | ICD-10-CM

## 2024-10-18 DIAGNOSIS — I739 Peripheral vascular disease, unspecified: Secondary | ICD-10-CM | POA: Diagnosis present

## 2024-10-18 DIAGNOSIS — I69391 Dysphagia following cerebral infarction: Secondary | ICD-10-CM | POA: Diagnosis not present

## 2024-10-18 DIAGNOSIS — G4737 Central sleep apnea in conditions classified elsewhere: Secondary | ICD-10-CM | POA: Diagnosis present

## 2024-10-18 DIAGNOSIS — R471 Dysarthria and anarthria: Secondary | ICD-10-CM | POA: Diagnosis present

## 2024-10-18 HISTORY — PX: IR ANGIO EXTRACRAN SEL COM CAROTID INNOMINATE UNI L MOD SED: IMG5355

## 2024-10-18 HISTORY — PX: RADIOLOGY WITH ANESTHESIA: SHX6223

## 2024-10-18 HISTORY — PX: IR NEURO EACH ADD'L AFTER BASIC UNI LEFT (MS): IMG5373

## 2024-10-18 HISTORY — PX: IR TRANSCATH/EMBOLIZ: IMG695

## 2024-10-18 HISTORY — PX: IR ANGIO EXTERNAL CAROTID SEL EXT CAROTID UNI L MOD SED: IMG5370

## 2024-10-18 LAB — PREPARE RBC (CROSSMATCH)

## 2024-10-18 SURGERY — RADIOLOGY WITH ANESTHESIA
Anesthesia: General

## 2024-10-18 MED ORDER — IRBESARTAN 150 MG PO TABS
150.0000 mg | ORAL_TABLET | Freq: Every day | ORAL | Status: DC
  Administered 2024-10-18 – 2024-10-19 (×2): 150 mg via ORAL
  Filled 2024-10-18 (×2): qty 1

## 2024-10-18 MED ORDER — SODIUM CHLORIDE 0.9 % IV SOLN: INTRAVENOUS | Status: DC

## 2024-10-18 MED ORDER — DULOXETINE HCL 60 MG PO CPEP
60.0000 mg | ORAL_CAPSULE | Freq: Every day | ORAL | Status: DC
  Administered 2024-10-19: 60 mg via ORAL
  Filled 2024-10-18: qty 1

## 2024-10-18 MED ORDER — ONDANSETRON HCL 4 MG/2ML IJ SOLN: 4.0000 mg | INTRAMUSCULAR | Status: DC | PRN

## 2024-10-18 MED ORDER — LABETALOL HCL 5 MG/ML IV SOLN: 10.0000 mg | INTRAVENOUS | Status: DC | PRN

## 2024-10-18 MED ORDER — ONDANSETRON HCL 4 MG/2ML IJ SOLN
INTRAMUSCULAR | Status: DC | PRN
  Administered 2024-10-18: 4 mg via INTRAVENOUS

## 2024-10-18 MED ORDER — TRAZODONE HCL 50 MG PO TABS
50.0000 mg | ORAL_TABLET | Freq: Every day | ORAL | Status: DC
  Administered 2024-10-18: 50 mg via ORAL
  Filled 2024-10-18: qty 1

## 2024-10-18 MED ORDER — ORAL CARE MOUTH RINSE: 15.0000 mL | Freq: Once | OROMUCOSAL | Status: AC

## 2024-10-18 MED ORDER — ACETAMINOPHEN 10 MG/ML IV SOLN: 1000.0000 mg | Freq: Once | INTRAVENOUS | Status: DC | PRN

## 2024-10-18 MED ORDER — ADULT MULTIVITAMIN W/MINERALS CH
1.0000 | ORAL_TABLET | Freq: Every day | ORAL | Status: DC
  Administered 2024-10-18 – 2024-10-19 (×2): 1 via ORAL
  Filled 2024-10-18 (×2): qty 1

## 2024-10-18 MED ORDER — FENTANYL CITRATE (PF) 100 MCG/2ML IJ SOLN
INTRAMUSCULAR | Status: AC
  Filled 2024-10-18: qty 2

## 2024-10-18 MED ORDER — LACTATED RINGERS IV SOLN: INTRAVENOUS | Status: DC | PRN

## 2024-10-18 MED ORDER — LEVETIRACETAM 500 MG PO TABS
500.0000 mg | ORAL_TABLET | Freq: Two times a day (BID) | ORAL | Status: DC
  Administered 2024-10-18 – 2024-10-19 (×2): 500 mg via ORAL
  Filled 2024-10-18 (×2): qty 1

## 2024-10-18 MED ORDER — OXYCODONE HCL 5 MG PO TABS
10.0000 mg | ORAL_TABLET | Freq: Two times a day (BID) | ORAL | Status: DC
  Administered 2024-10-18 – 2024-10-19 (×2): 10 mg via ORAL
  Filled 2024-10-18 (×2): qty 2

## 2024-10-18 MED ORDER — FENTANYL CITRATE (PF) 100 MCG/2ML IJ SOLN: 25.0000 ug | INTRAMUSCULAR | Status: DC | PRN

## 2024-10-18 MED ORDER — PANTOPRAZOLE SODIUM 40 MG PO TBEC: 80.0000 mg | DELAYED_RELEASE_TABLET | Freq: Every day | ORAL | Status: DC

## 2024-10-18 MED ORDER — CEFAZOLIN SODIUM-DEXTROSE 2-4 GM/100ML-% IV SOLN
2.0000 g | INTRAVENOUS | Status: AC
  Administered 2024-10-18: 2 g via INTRAVENOUS

## 2024-10-18 MED ORDER — SUGAMMADEX SODIUM 200 MG/2ML IV SOLN
INTRAVENOUS | Status: DC | PRN
  Administered 2024-10-18: 300 mg via INTRAVENOUS

## 2024-10-18 MED ORDER — CHLORHEXIDINE GLUCONATE CLOTH 2 % EX PADS: 6.0000 | MEDICATED_PAD | Freq: Once | CUTANEOUS | Status: DC

## 2024-10-18 MED ORDER — DEXAMETHASONE SOD PHOSPHATE PF 10 MG/ML IJ SOLN
INTRAMUSCULAR | Status: DC | PRN
  Administered 2024-10-18: 5 mg via INTRAVENOUS

## 2024-10-18 MED ORDER — MELATONIN 5 MG PO TABS: 20.0000 mg | ORAL_TABLET | Freq: Every evening | ORAL | Status: DC | PRN

## 2024-10-18 MED ORDER — SODIUM CHLORIDE 0.9% IV SOLUTION: Freq: Once | INTRAVENOUS | Status: DC

## 2024-10-18 MED ORDER — FENTANYL CITRATE (PF) 250 MCG/5ML IJ SOLN
INTRAMUSCULAR | Status: DC | PRN
  Administered 2024-10-18: 100 ug via INTRAVENOUS

## 2024-10-18 MED ORDER — PHENYLEPHRINE HCL-NACL 20-0.9 MG/250ML-% IV SOLN
INTRAVENOUS | Status: DC | PRN
  Administered 2024-10-18: 40 ug/min via INTRAVENOUS

## 2024-10-18 MED ORDER — ROCURONIUM BROMIDE 10 MG/ML (PF) SYRINGE
PREFILLED_SYRINGE | INTRAVENOUS | Status: DC | PRN
  Administered 2024-10-18: 60 mg via INTRAVENOUS
  Administered 2024-10-18: 20 mg via INTRAVENOUS

## 2024-10-18 MED ORDER — IOHEXOL 300 MG/ML  SOLN: 150.0000 mL | Freq: Once | INTRAMUSCULAR | Status: DC | PRN

## 2024-10-18 MED ORDER — CHLORHEXIDINE GLUCONATE 0.12 % MT SOLN: 15.0000 mL | Freq: Once | OROMUCOSAL | Status: AC

## 2024-10-18 MED ORDER — CEFAZOLIN SODIUM-DEXTROSE 2-4 GM/100ML-% IV SOLN
INTRAVENOUS | Status: AC
  Filled 2024-10-18: qty 100

## 2024-10-18 MED ORDER — PROPOFOL 10 MG/ML IV BOLUS
INTRAVENOUS | Status: DC | PRN
  Administered 2024-10-18: 170 mg via INTRAVENOUS

## 2024-10-18 MED ORDER — LIDOCAINE 2% (20 MG/ML) 5 ML SYRINGE
INTRAMUSCULAR | Status: DC | PRN
  Administered 2024-10-18: 60 mg via INTRAVENOUS

## 2024-10-18 MED ORDER — ONDANSETRON HCL 4 MG PO TABS: 4.0000 mg | ORAL_TABLET | ORAL | Status: DC | PRN

## 2024-10-18 MED ORDER — DROPERIDOL 2.5 MG/ML IJ SOLN: 0.6250 mg | Freq: Once | INTRAMUSCULAR | Status: DC | PRN

## 2024-10-18 MED ORDER — PRAVASTATIN SODIUM 40 MG PO TABS
20.0000 mg | ORAL_TABLET | Freq: Every day | ORAL | Status: DC
  Administered 2024-10-18: 20 mg via ORAL
  Filled 2024-10-18: qty 1

## 2024-10-18 MED ORDER — CHLORHEXIDINE GLUCONATE 0.12 % MT SOLN
OROMUCOSAL | Status: AC
  Administered 2024-10-18: 15 mL via OROMUCOSAL
  Filled 2024-10-18: qty 15

## 2024-10-18 NOTE — Telephone Encounter (Signed)
 Spoke to the patient's wife and he is feeling better.  He was preparing for his procedure.

## 2024-10-18 NOTE — Anesthesia Procedure Notes (Signed)
 Arterial Line Insertion Start/End12/02/2024 12:05 PM, 10/18/2024 12:10 PM Performed by: Dorethea Cordella SQUIBB, DO, Cindie Donald CROME, CRNA  Patient location: Pre-op. Preanesthetic checklist: patient identified, IV checked, site marked, risks and benefits discussed, surgical consent, monitors and equipment checked, pre-op evaluation, timeout performed and anesthesia consent Lidocaine  1% used for infiltration Right, radial was placed Catheter size: 20 G Hand hygiene performed  and maximum sterile barriers used   Attempts: 1 Following insertion, dressing applied and Biopatch. Post procedure assessment: normal and unchanged  Patient tolerated the procedure well with no immediate complications.

## 2024-10-18 NOTE — Transfer of Care (Signed)
 Immediate Anesthesia Transfer of Care Note  Patient: Richard Vaughn  Procedure(s) Performed: RADIOLOGY WITH ANESTHESIA  Patient Location: PACU  Anesthesia Type:General  Level of Consciousness: awake and alert   Airway & Oxygen Therapy: Patient Spontanous Breathing and Patient connected to nasal cannula oxygen  Post-op Assessment: Report given to RN and Post -op Vital signs reviewed and stable  Post vital signs: Reviewed and stable  Last Vitals:  Vitals Value Taken Time  BP 147/101 10/18/24 14:35  Temp 36.9 C 10/18/24 14:30  Pulse 81 10/18/24 14:43  Resp 11 10/18/24 14:43  SpO2 96 % 10/18/24 14:43  Vitals shown include unfiled device data.  Last Pain:  Vitals:   10/18/24 1430  PainSc: Asleep         Complications: No notable events documented.

## 2024-10-18 NOTE — Plan of Care (Signed)
 Received post-op from PACU. Oriented to room and call bell.  Problem: Activity: Goal: Risk for activity intolerance will decrease Outcome: Progressing   Problem: Clinical Measurements: Goal: Cardiovascular complication will be avoided Outcome: Progressing   Problem: Clinical Measurements: Goal: Respiratory complications will improve Outcome: Progressing   Problem: Clinical Measurements: Goal: Will remain free from infection Outcome: Progressing   Problem: Nutrition: Goal: Adequate nutrition will be maintained Outcome: Progressing   Problem: Skin Integrity: Goal: Risk for impaired skin integrity will decrease Outcome: Progressing   Problem: Safety: Goal: Ability to remain free from injury will improve Outcome: Progressing

## 2024-10-18 NOTE — Op Note (Signed)
 ENDOVASCULAR NEUROSURGERY OPERATIVE NOTE   PROCEDURE: Onyx embolization of left middle meningeal artery   HISTORY:   The patient is a 73 y.o. yo male who suffered a fall months ago and has been monitored in the outpatient clinic with an enlarging chronic subdural hematoma on the left side. Thankfully it remains largely asymptomatic however given the enlargement he was referred for MMA embolization.  APPROACH:   The technical aspects of the procedure as well as its potential risks and benefits were reviewed with the patient and his wife. These risks included but were not limited stroke leading to weakness, numbness, paralysis, coma, death, allergic reaction, damage to organs/vital structures, and hematoma formation. With an understanding of these risks, informed consent was obtained and witnessed.    The patient was placed in the supine position on the angiography table and the skin of right groin prepped in the usual sterile fashion. The procedure was performed under general anesthesia monitored by the anesthesia service.  Short 5Fr sheath was placed in the right common femoral artery using standard seldinger technique. Position of the sheath was documented with fluoro-phase images.    HEPARIN:  0 Units total.    CONTRAST AGENT:  See IR records   FLUOROSCOPY TIME:  See IR records    CATHETER(S) AND WIRE(S):    5-French MPD guide catheter   Terumo Simmons 2 glide catheter 180 cm 0.035" glidewire   Exchange 035 Glidewire Apollo microcatheter [employer] 10 microwire  LIQUID EMBOLIC AGENT USED: Onyx 18  VESSELS CATHETERIZED:   Left internal carotid   Left external carotid   Left middle meningeal artery Right common femoral  VESSELS STUDIED:   Left internal carotid, head Left external carotid, head Left middle meningeal artery, microcatheter run Left external carotid, post-embolization  Right femoral  PROCEDURAL NARRATIVE:   The MPD guide catheter was introduced over the  microwire.  Attempt was made to catheterize the left common carotid artery which was unsuccessful due to type III arch.  The MPD guide catheter was removed and the [facility] 2 glide catheter was introduced over the Glidewire.  Secondary curve was reformed over the aortic arch.  The left common carotid artery was then selected.  The wire was advanced into the mid cervical left internal carotid artery.  Simmons 2 catheter was then advanced into the internal carotid artery.  Cerebral angiogram was then taken.  The exchange Glidewire was then introduced, and the [employer] 2 glide catheter was exchanged for the MPD catheter.  The left external carotid artery was selected.  External carotid angiogram was then taken.  After review of the images, I elected to proceed with the embolization.  Under roadmap guidance, the Apollo microcatheter was introduced over the microwire and the middle meningeal artery was selected.  Multiple microcatheter runs were taken in order to position the microcatheter just proximal to the parietal branch of the middle meningeal artery.  After review of the images again, we elected to proceed with the embolization.  The microwire was removed and the catheter was flushed with DMSO.  Under standard roadmap and blank roadmap technique, the middle meningeal artery was embolized with Onyx.  After completion of the embolization, the microcatheter was removed without incident.  Final control angiogram was taken through the guide catheter.  After review of the images, the guide catheter was removed without incident.  INTERPRETATION:   Left internal carotid, head:   Injection reveals the presence of a widely patent ICA, M1, and A1 segments and their branches. No  aneurysms, arteriovenous malformations, or high flow fistulas are visualized.  The ophthalmic artery appears to have normal origin visualized retinal/choroidal perfusion.  The parenchymal and venous phases are unremarkable, with minimal mass effect  upon the convexity from the known overlying chronic subdural hematoma. The venous sinuses are widely patent.    Left external carotid, head:   Visualized cranial branches of the external carotid artery are unremarkable.  Of note, there is normal origin of the middle meningeal artery.  No perfusion of the brain is noted.  There is no opacification of the intracranial cortical veins or dural venous sinuses.  Left middle meningeal artery: Microcatheter runs taken in the middle meningeal artery reveal no perfusion of the underlying brain.  There is slight contrast blush to suggest perfusion to the known chronic subdural hematoma membrane.  Left external carotid, post-embolization: The visualized branches of the external carotid artery remain unremarkable.  Onyx cast is seen within the intracranial middle meningeal artery, with occlusion of this vessel near its origin.  Right femoral:    Normal vessel. No significant atherosclerotic disease. Arterial sheath in adequate position.   DISPOSITION:  Upon completion of the study, the femoral sheath was removed and hemostasis obtained using a 6-Fr Angio-Seal closure device. Good proximal and distal lower extremity pulses were documented upon achievement of hemostasis. The procedure was well tolerated and no early complications were observed.  The patient was transferred to the postanesthesia care unit to be positioned flat in bed for 3 hours.    IMPRESSION:  1. Successful Onyx embolization of the left middle meningeal artery for chronic subdural hematoma.    Richard Maizes, MD [facility]

## 2024-10-18 NOTE — H&P (Addendum)
 HPI:     Richard Vaughn is a 73 year old man I am seeing in follow-up.  He has an enlarging chronic subdural hematoma on the left side.  He was scheduled for middle meningeal artery embolization which was canceled as the patient and his wife had some questions prior to the procedure. After office visit earlier this week, he presents for the procedure.   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/2023   Adhesive capsulitis of left shoulder 11/17/2023   Marijuana use 06/09/2023   History of intracranial hemorrhage 06/09/2023   Drug-induced constipation 06/08/2022   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) 12/09/2021   Depression, major, recurrent, mild    Sleep disturbance    Essential hypertension    Left thyroid  nodule    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) 10/25/2018   Chronic pain syndrome    Chronic bilateral low back pain    Dysphagia, post-stroke    Insomnia 07/13/2017   PAD (peripheral artery disease) 07/13/2017   Hyperlipidemia with target LDL less than 70 07/13/2017   Paresthesia 09/22/2016   Primary snoring 09/23/2015   Obstructive sleep apnea 09/23/2015   Sleep apnea 08/25/2015   Dysarthria    CVD (cardiovascular disease) 08/02/2015   Systolic hypertension with cerebrovascular disease 12/27/2014   NEPHROLITHIASIS 05/23/2009   BARRETTS ESOPHAGUS 02/20/2009   Gastroparesis 02/20/2009   RADICULOPATHY 10/21/2008   GERD 10/08/2008   ESOPHAGEAL STRICTURE 08/15/2008   RENAL CYST 08/14/2008   PULMONARY NODULE 01/10/2008   Past Medical History:  Diagnosis Date   Barrett's esophagus    Cerebrovascular accident  (CVA) due to thrombosis of left carotid artery (HCC) 09/23/2015   Chronic back pain    CVA (cerebral vascular accident) (HCC) 09/22/2016   Depression    Gastritis    Mild   Gastroparesis    GERD (gastroesophageal reflux disease)    Headache    History of kidney stones    Hyperglycemia    Hypertension    ICH (intracerebral hemorrhage) (HCC) - R thalmic/PLIC d/t HTN 93/98/7980   Lacunar infarct, acute (HCC) 08/25/2015   Nephrolithiasis    Pneumonia    Covid pneumonia   Renal cyst    Sleep apnea    Stricture and stenosis of esophagus    Stroke (HCC) 07/2015   Stroke-like symptom 09/22/2016   Treatment-emergent central sleep apnea 10/25/2018   Vision abnormalities     Past Surgical History:  Procedure Laterality Date   CERVICAL DISC SURGERY  2012   COLONOSCOPY     LUMBAR DISC SURGERY  2005, 2010   replaced L4 and L5   SHOULDER ARTHROSCOPY WITH ROTATOR CUFF REPAIR Left 08/14/2020   Procedure: SHOULDER ARTHROSCOPY WITH ROTATOR CUFF REPAIR, SUBACROMIAL DECOMPRESSION;  Surgeon: Dozier Soulier, MD;  Location: [facility];  Service: Orthopedics;  Laterality: Left;   UPPER GASTROINTESTINAL ENDOSCOPY      No medications prior to admission.   No Known Allergies  Social History   Tobacco Use   Smoking status: Former    Current packs/day: 0.00    Average packs/day: 0.5 packs/day for 30.0 years (15.0 ttl pk-yrs)    Types: Cigarettes    Start date: 04/15/1988  Quit date: 04/15/2018    Years since quitting: 6.5   Smokeless tobacco: Never  Substance Use Topics   Alcohol use: No    Alcohol/week: 0.0 standard drinks of alcohol    Family History  Problem Relation Age of Onset   Hypertension Father    Stroke Father    Hypertension Mother    Liver cancer Mother    Hypertension Sister        2 other sisters has also   Stroke Sister    Stroke Sister    Hypertension Brother        2nd brother has also   Colon cancer Neg Hx    Esophageal cancer Neg Hx    Rectal cancer Neg Hx     Stomach cancer Neg Hx    Colon polyps Neg Hx      Objective:   Speech is appropriate but dysarthric 3-4/5 LUE/LLE diffusely Good strength RUE/RLE   Assessment:   73 year old man with enlarging chronic left convexity subdural hematoma, largely asymptomatic.  He remains a candidate for middle meningeal artery embolization.   Plan:   - will proceed with left MMA embolization  I again reviewed the details of the procedure, risks, benefits, alternatives, and expected postoperative course with the patient and his wife.  All their questions were answered.  They now indicated willingness to proceed.   Gerldine Maizes, MD [facility]

## 2024-10-18 NOTE — Anesthesia Procedure Notes (Signed)
 Procedure Name: Intubation Date/Time: 10/18/2024 1:09 PM  Performed by: Boyce Shilling, CRNAPre-anesthesia Checklist: Patient identified, Emergency Drugs available, Suction available, Timeout performed and Patient being monitored Patient Re-evaluated:Patient Re-evaluated prior to induction Oxygen Delivery Method: Circle system utilized Preoxygenation: Pre-oxygenation with 100% oxygen Induction Type: IV induction Ventilation: Mask ventilation without difficulty Laryngoscope Size: Mac and 4 Grade View: Grade I Tube type: Oral Tube size: 7.5 mm Number of attempts: 1 Airway Equipment and Method: Stylet Placement Confirmation: ETT inserted through vocal cords under direct vision, positive ETCO2, CO2 detector and breath sounds checked- equal and bilateral Secured at: 23 cm Tube secured with: Tape Dental Injury: Teeth and Oropharynx as per pre-operative assessment

## 2024-10-18 NOTE — Telephone Encounter (Signed)
Answered in phone message.

## 2024-10-19 ENCOUNTER — Encounter (HOSPITAL_COMMUNITY): Payer: Self-pay | Admitting: Neurosurgery

## 2024-10-19 LAB — BPAM RBC
Blood Product Expiration Date: 202512312359
Blood Product Expiration Date: 202512312359
ISSUE DATE / TIME: 202512040724
Unit Type and Rh: 5100
Unit Type and Rh: 5100

## 2024-10-19 LAB — TYPE AND SCREEN
ABO/RH(D): O POS
Antibody Screen: NEGATIVE
Unit division: 0
Unit division: 0

## 2024-10-19 NOTE — Plan of Care (Signed)

## 2024-10-19 NOTE — Care Management (Signed)
  Transition of Care [facility]) Screening Note   Patient Details  Name: Richard Vaughn Date of Birth: 10-02-1951   [facility] of [facility]) CM/SW Contact:    Corean JAYSON Canary, RN Phone Number: 10/19/2024, 9:37 AM    Transition of Care Department [facility]) has reviewed patient and no TOC needs have been identified at this time. We will continue to monitor patient advancement through interdisciplinary progression rounds. If new patient transition needs arise, please place a TOC consult.

## 2024-10-19 NOTE — Progress Notes (Signed)
 Discharge instructions provided to patient and spouse. They both verbalize understanding of all instructions.

## 2024-10-19 NOTE — Discharge Summary (Signed)
 Physician Discharge Summary  Patient ID: Richard Vaughn MRN: [id number] DOB/AGE: 17-Dec-1950 73 y.o.  Admit date: 10/18/2024 Discharge date: 10/19/2024  Admission Diagnoses:  cSDH  Discharge Diagnoses:  Same Principal Problem:   Chronic subdural hematoma [facility])   Discharged Condition: Stable  Hospital Course:  VONDELL BABERS is a 73 y.o. male who underwent uncomplicated left MMA embolization of cSDH. He was at baseline postop d#1 and was discharged home.  Treatments: Surgery - left MMA embolization  Discharge Exam: Blood pressure (!) 145/90, pulse 67, temperature (!) 97.5 F (36.4 C), temperature source Oral, resp. rate 18, height 6' 2 (1.88 m), weight 109.8 kg, SpO2 96%. 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) Wound c/d/i  Disposition: Discharge disposition: 01-Home or Self Care       Discharge Instructions     Call MD for:  redness, tenderness, or signs of infection (pain, swelling, redness, odor or green/yellow discharge around incision site)   Complete by: As directed    Call MD for:  temperature >100.4   Complete by: As directed    Diet - low sodium heart healthy   Complete by: As directed    Discharge instructions   Complete by: As directed    Walk at home as much as possible, at least 4 times / day   Increase activity slowly   Complete by: As directed    Lifting restrictions   Complete by: As directed    No lifting > 10 lbs   May shower / Bathe   Complete by: As directed    48 hours after surgery   May walk up steps   Complete by: As directed    No dressing needed   Complete by: As directed    Other Restrictions   Complete by: As directed    No bending/twisting at waist      Allergies as of 10/19/2024   No Known Allergies      Medication List     PAUSE taking these medications    aspirin  EC 81 MG tablet Wait to take this until your doctor or other care provider tells you to start  again. Take 1 tablet (81 mg total) by mouth daily. Swallow whole.       TAKE these medications    docusate sodium  100 MG capsule Commonly known as: COLACE Take 100 mg by mouth at bedtime.   DULoxetine  60 MG capsule Commonly known as: CYMBALTA  TAKE 1 CAPSULE BY MOUTH EVERY DAY   esomeprazole  40 MG capsule Commonly known as: NEXIUM  TAKE 1 CAPSULE (40 MG TOTAL) BY MOUTH 2 (TWO) TIMES DAILY BEFORE A MEAL.   Gemtesa 75 MG Tabs Generic drug: Vibegron   levETIRAcetam  500 MG tablet Commonly known as: Keppra  Take 1 tablet (500 mg total) by mouth 2 (two) times daily.   lovastatin  20 MG tablet Commonly known as: MEVACOR  TAKE 1 TABLET BY MOUTH EVERYDAY AT BEDTIME   Melatonin 10 MG Tabs Take 20 mg by mouth at bedtime as needed (for sleeo).   multivitamin with minerals Tabs tablet Take 1 tablet by mouth daily.   olmesartan  20 MG tablet Commonly known as: BENICAR  TAKE 1 TABLET BY MOUTH EVERY DAY   Oxycodone  HCl 10 MG Tabs Take 1 tablet (10 mg total) by mouth 2 (two) times daily.   traZODone  50 MG tablet Commonly known as: DESYREL  TAKE 1 TABLET BY MOUTH EVERYDAY AT BEDTIME  Discharge Care Instructions  (From admission, onward)           Start     Ordered   10/19/24 0000  No dressing needed        10/19/24 9160            Follow-up Information     Mavis Purchase, MD Follow up in 3 week(s).   Specialty: Neurosurgery Contact information: [address] [place] [address] [phone]                 Signed: Gerldine JAYSON Maizes 10/19/2024, 8:39 AM

## 2024-10-19 NOTE — Anesthesia Postprocedure Evaluation (Signed)
 Anesthesia Post Note  Patient: Richard Vaughn  Procedure(s) Performed: RADIOLOGY WITH ANESTHESIA     Patient location during evaluation: PACU Anesthesia Type: General Level of consciousness: awake and alert Pain management: pain level controlled Vital Signs Assessment: post-procedure vital signs reviewed and stable Respiratory status: spontaneous breathing, nonlabored ventilation, respiratory function stable and patient connected to nasal cannula oxygen Cardiovascular status: blood pressure returned to baseline and stable Postop Assessment: no apparent nausea or vomiting Anesthetic complications: no   No notable events documented.  Last Vitals:  Vitals:   10/19/24 0305 10/19/24 0735  BP: 118/89 (!) 145/90  Pulse:  67  Resp: 16 18  Temp: 36.7 C (!) 36.4 C  SpO2:  96%    Last Pain:  Vitals:   10/19/24 0735  TempSrc: Oral  PainSc:                  Shauntee Karp S

## 2024-10-19 NOTE — Progress Notes (Signed)
  NEUROSURGERY PROGRESS NOTE   No issues overnight. Pt without complaint this am.  EXAM:  BP (!) 145/90 (BP Location: Right Arm)   Pulse 67   Temp (!) 97.5 F (36.4 C) (Oral)   Resp 18   Ht 6' 2 (1.88 m)   Wt 109.8 kg   SpO2 96%   BMI 31.07 kg/m   Awake, alert, oriented  Speech fluent, dysarthric Strength at baseline Right groin site soft  IMPRESSION:  73 y.o. male s/p left MMA embolization, doing well  PLAN: - d/c home today, plan on f/u CT in 3 weeks and f/u with Dr. Mavis.    Gerldine Maizes, MD [facility]

## 2024-10-22 ENCOUNTER — Telehealth: Payer: Self-pay

## 2024-10-22 ENCOUNTER — Telehealth: Payer: Self-pay | Admitting: Family Medicine

## 2024-10-22 DIAGNOSIS — G8929 Other chronic pain: Secondary | ICD-10-CM

## 2024-10-22 DIAGNOSIS — G894 Chronic pain syndrome: Secondary | ICD-10-CM

## 2024-10-22 NOTE — Transitions of Care (Post Inpatient/ED Visit) (Signed)
 10/22/2024  Name: Richard Vaughn MRN: [id number] DOB: [birth year]  Today's TOC FU Call Status: Today's TOC FU Call Status:: Successful TOC FU Call Completed TOC FU Call Complete Date: 10/22/24  Patient's Name and Date of Birth confirmed. Name, DOB  Transition Care Management Follow-up Telephone Call Date of Discharge: 10/19/24 Discharge Facility: [facility]) Type of Discharge: Inpatient Admission Primary Inpatient Discharge Diagnosis:: Chronic subdural hematoma How have you been since you were released from the hospital?: Better Any questions or concerns?: No  Items Reviewed: Did you receive and understand the discharge instructions provided?: Yes Medications obtained,verified, and reconciled?: Yes (Medications Reviewed) Any new allergies since your discharge?: No Dietary orders reviewed?: Yes Type of Diet Ordered:: low salt heart healthy Do you have support at home?: Yes People in Home [RPT]: spouse Name of Support/Comfort Primary Source: [name]  Medications Reviewed Today: Medications Reviewed Today     Reviewed by Rumalda Alan PENNER, RN (Registered Nurse) on 10/22/24 at [phone]  Med List Status: <None>   Medication Order Taking? Sig Documenting Provider Last Dose Status Informant  aspirin  EC 81 MG tablet [id number]  Take 1 tablet (81 mg total) by mouth daily. Swallow whole.  Patient not taking: Reported on 10/22/2024   Whitfield [name], NP  Active Self, Spouse/Significant Other, Pharmacy Records, Multiple Informants  docusate sodium  (COLACE) 100 MG capsule [id number] Yes Take 100 mg by mouth at bedtime. [provider]  Active Self, Spouse/Significant Other, Pharmacy Records, Multiple Informants  DULoxetine  (CYMBALTA ) 60 MG capsule [id number] Yes TAKE 1 CAPSULE BY MOUTH EVERY DAY Jordan, [name], MD  Active Self, Spouse/Significant Other, Pharmacy Records, Multiple Informants  esomeprazole  (NEXIUM ) 40 MG capsule [id number] Yes TAKE 1 CAPSULE (40 MG TOTAL) BY MOUTH 2 (TWO)  TIMES DAILY BEFORE A MEAL. Craig Alan SAUNDERS, PA-C  Active Self, Spouse/Significant Other, Pharmacy Records, Multiple Informants  levETIRAcetam  (KEPPRA ) 500 MG tablet [id number]  Take 1 tablet (500 mg total) by mouth 2 (two) times daily.  Patient not taking: Reported on 10/22/2024   Odell Balls, PA-C  Active   lovastatin  (MEVACOR ) 20 MG tablet [id number] Yes TAKE 1 TABLET BY MOUTH EVERYDAY AT BEDTIME Jordan, [name], MD  Active Self, Spouse/Significant Other, Pharmacy Records, Multiple Informants  Melatonin 10 MG TABS [id number] Yes Take 20 mg by mouth at bedtime as needed (for sleeo). [provider]  Active Self, Spouse/Significant Other, Pharmacy Records, Multiple Informants  Multiple Vitamin (MULTIVITAMIN WITH MINERALS) TABS tablet [id number] Yes Take 1 tablet by mouth daily. [provider]  Active Self, Spouse/Significant Other, Pharmacy Records, Multiple Informants  olmesartan  (BENICAR ) 20 MG tablet [id number] Yes TAKE 1 TABLET BY MOUTH EVERY DAY Jordan, [name], MD  Active Self, Spouse/Significant Other, Pharmacy Records, Multiple Informants  Oxycodone  HCl 10 MG TABS [id number] Yes Take 1 tablet (10 mg total) by mouth 2 (two) times daily. Jordan, [name], MD  Active Self, Spouse/Significant Other, Pharmacy Records, Multiple Informants  traZODone  (DESYREL ) 50 MG tablet [id number] Yes TAKE 1 TABLET BY MOUTH EVERYDAY AT BEDTIME Jordan, [name], MD  Active Self, Spouse/Significant Other, Pharmacy Records, Multiple Informants  Vibegron [facility]) 75 MG TABS [id number] Yes  [provider]  Active Self, Spouse/Significant Other, Pharmacy Records, Multiple Informants            Home Care and Equipment/Supplies: Were Home Health Services Ordered?: No Any new equipment or medical supplies ordered?: No  Functional Questionnaire: Do you need assistance with bathing/showering or dressing?: Yes Do you need assistance with meal preparation?: Yes  Do you need assistance with  eating?: No Do you have difficulty maintaining continence: Yes Do you need assistance with getting out of bed/getting out of a chair/moving?: Yes Do you have difficulty managing or taking your medications?: Yes (wife manages medications)  Follow up appointments reviewed: PCP Follow-up appointment confirmed?: No (wife states she will call today and schedule) MD Provider Line Number:[phone] Given: No Specialist Hospital Follow-up appointment confirmed?: No Reason Specialist Follow-Up Not Confirmed: Patient has Specialist Provider Number and will Call for Appointment Do you need transportation to your follow-up appointment?: No Do you understand care options if your condition(s) worsen?: Yes-patient verbalized understanding  SDOH Interventions Today    Flowsheet Row Most Recent Value  SDOH Interventions   Food Insecurity Interventions Intervention Not Indicated  Housing Interventions Intervention Not Indicated  Transportation Interventions Intervention Not Indicated  Utilities Interventions Intervention Not Indicated    Goals Addressed             This Visit's Progress    [facility] Transitions of Care (TOC) Care Plan       Problems:  Recent Hospitalization for treatment of subdural hematoma embolization No Hospital Follow Up Provider appointment wife to call and schedule  Goal:  Over the next 30 days, the patient will not experience hospital readmission  Interventions:  Transitions of Care: Doctor Visits  - discussed the importance of doctor visits Post discharge activity limitations prescribed by provider reviewed Reviewed Signs and symptoms of infection  Subdural hematoma Reviewed Importance of taking all medications as prescribed Reviewed Importance of attending all scheduled provider appointments Advised to report any changes in symptoms or exercise tolerance Screening for signs and symptoms of depression related to chronic disease state Assessed social determinant of  health barriers Assessed for signs and symptoms of stroke Assessed for management of bladder and/or bowel incontinence Assessed for cognitive impairment Assessed for fall status and safety in the home Reviewed any changes in mental status with wife. Provided support Reviewed signs of stroke-  wife a nurse and is aware. Doing a great job caring for patient.  Assessed BP   Patient Self Care Activities:  Attend all scheduled provider appointments Call pharmacy for medication refills 3-7 days in advance of running out of medications Call provider office for new concerns or questions  Notify RN Care Manager of TOC call rescheduling needs Participate in Transition of Care Program/Attend TOC scheduled calls Take medications as prescribed   Call PCP, Neurosurgeon and neurologist to schedule follow up appointments, Call 911 for changes in mental status Use walker to prevent falls.   Plan:  Telephone follow up appointment with care management team member scheduled for:  10/30/2024 The patient has been provided with contact information for the care management team and has been advised to call with any health related questions or concerns.        This note sent to PCP. Alan Ee, RN, BSN, CEN [employer]- Transition of Care Team.  [facility] [phone]

## 2024-10-22 NOTE — Telephone Encounter (Unsigned)
 Copied from CRM #[id number]. Topic: Clinical - Medication Refill >> Oct 22, 2024 10:43 AM Alexandria E wrote: Medication: Oxycodone  HCl 10 MG TABS   Has the patient contacted their pharmacy? Yes (Agent: If no, request that the patient contact the pharmacy for the refill. If patient does not wish to contact the pharmacy document the reason why and proceed with request.) (Agent: If yes, when and what did the pharmacy advise?)  This is the patient's preferred pharmacy:    [id number] - [place], [place] - [address] AT [address] [place] [address] Phone: [phone] Fax: [phone]    Is this the correct pharmacy for this prescription? Yes If no, delete pharmacy and type the correct one.   Has the prescription been filled recently? No  Is the patient out of the medication? Yes  Has the patient been seen for an appointment in the last year OR does the patient have an upcoming appointment? Yes  Can we respond through MyChart? Yes  Agent: Please be advised that Rx refills may take up to 3 business days. We ask that you follow-up with your pharmacy.

## 2024-10-23 MED ORDER — OXYCODONE HCL 10 MG PO TABS: 10.0000 mg | ORAL_TABLET | Freq: Two times a day (BID) | ORAL | 0 refills | Status: DC

## 2024-10-24 ENCOUNTER — Ambulatory Visit: Admitting: Family Medicine

## 2024-10-24 ENCOUNTER — Encounter: Payer: Self-pay | Admitting: Podiatry

## 2024-10-24 ENCOUNTER — Ambulatory Visit (INDEPENDENT_AMBULATORY_CARE_PROVIDER_SITE_OTHER): Admitting: Podiatry

## 2024-10-24 ENCOUNTER — Encounter: Payer: Self-pay | Admitting: Family Medicine

## 2024-10-24 VITALS — BP 136/80 | HR 82 | Temp 97.9°F | Resp 16 | Ht 74.0 in | Wt 242.4 lb

## 2024-10-24 DIAGNOSIS — M79674 Pain in right toe(s): Secondary | ICD-10-CM

## 2024-10-24 DIAGNOSIS — I639 Cerebral infarction, unspecified: Secondary | ICD-10-CM

## 2024-10-24 DIAGNOSIS — I69354 Hemiplegia and hemiparesis following cerebral infarction affecting left non-dominant side: Secondary | ICD-10-CM | POA: Diagnosis not present

## 2024-10-24 DIAGNOSIS — I499 Cardiac arrhythmia, unspecified: Secondary | ICD-10-CM

## 2024-10-24 DIAGNOSIS — B351 Tinea unguium: Secondary | ICD-10-CM

## 2024-10-24 DIAGNOSIS — I10 Essential (primary) hypertension: Secondary | ICD-10-CM

## 2024-10-24 DIAGNOSIS — I6203 Nontraumatic chronic subdural hemorrhage: Secondary | ICD-10-CM | POA: Diagnosis not present

## 2024-10-24 DIAGNOSIS — M79675 Pain in left toe(s): Secondary | ICD-10-CM | POA: Diagnosis not present

## 2024-10-24 DIAGNOSIS — R748 Abnormal levels of other serum enzymes: Secondary | ICD-10-CM

## 2024-10-24 NOTE — Patient Instructions (Addendum)
 A few things to remember from today's visit:  Essential hypertension  Chronic subdural hematoma (HCC)  Elevated alkaline phosphatase level  Cardiac arrhythmia, unspecified cardiac arrhythmia type - Plan: Ambulatory referral to Cardiology  No changes today.  If you need refills for medications you take chronically, please call your pharmacy. Do not use My Chart to request refills or for acute issues that need immediate attention. If you send a my chart message, it may take a few days to be addressed, specially if I am not in the office.  Please be sure medication list is accurate. If a new problem present, please set up appointment sooner than planned today.

## 2024-10-24 NOTE — Progress Notes (Signed)
 Chief Complaint  Patient presents with   Hospitalization Follow-up   Discussed the use of AI scribe software for clinical note transcription with the patient, who gave verbal consent to proceed. History of Present Illness Richard Vaughn is a 73 year old male with a PMHx significant for insomnia, chronic pain, HTN, OSA, GERD, HLD, CVA, and depression here today with his wife for hospital follow up. He was admitted from 10/18/2024 to 10/19/2024, underwent uncomplicated left MMA embolization of chronic subdural hematoma on 10/18/2024. TOC call on 10/22/2024. Post-procedure, his blood pressure was slightly elevated but has since improved, with diastolic readings stabilizing from 94 to 88. He is currently on Olmesartan  20 mg daily.  He experiences intermittent episodes of tachycardia, particularly when walking, but recovers quickly upon resting. His heart rate has decreased from 130 to around 108-110 during these episodes. No chest pain or difficulty breathing during these episodes. His wife notes that episodes of elevated HR are not longer associated with diaphoresis as he did previously.  Prior to the procedure, he was evaluated in the ED, 10/16/24, for an episode of altered mental status, characterized by difficulty articulating thoughts, inability to recognize familiar streets, and garbled speech.  On 10/16/24 he had a neck and head CTA: Negative for an acute process.  1. No large vessel occlusion or significant intracranial stenosis. 2. Unchanged at least moderate stenosis of the origin of the nondominant left vertebral artery. 3. Wide patency of the dominant right vertebral artery and of the carotid arteries. 4. 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  He was prescribed Keppra  for possible seizure episodes but he does not want to take medication.  His speech is back to his baseline.  He has a  history of CVS with residual dysphagia and mildly slurred speech.  He has not had any changes in bowel habits or urinary symptoms.   Lab Results  Component Value Date   NA 139 10/16/2024   CL 103 10/16/2024   K 3.7 10/16/2024   CO2 25 10/16/2024   BUN 10 10/16/2024   CREATININE 1.01 10/16/2024   GFRNONAA >60 10/16/2024   CALCIUM  9.2 10/16/2024   ALBUMIN 4.2 10/16/2024   GLUCOSE 92 10/16/2024   Lab Results  Component Value Date   WBC 7.4 10/16/2024   HGB 14.8 10/16/2024   HCT 44.3 10/16/2024   MCV 88.4 10/16/2024   PLT 207 10/16/2024   Alkaline phosphatase mildly elevated. No abdominal pain, nausea, vomiting,or jaundice.  Lab Results  Component Value Date   ALT 15 10/16/2024   AST 15 10/16/2024   ALKPHOS 130 (H) 10/16/2024   BILITOT 0.5 10/16/2024   Review of Systems  Constitutional:  Negative for activity change, appetite change, chills and fever.  HENT:  Negative for sore throat.   Respiratory:  Negative for cough, shortness of breath and wheezing.   Cardiovascular:  Negative for chest pain.  Endocrine: Negative for cold intolerance and heat intolerance.  Genitourinary:  Negative for decreased urine volume, dysuria and hematuria.  Skin:  Negative for rash.  Neurological:  Negative for syncope and headaches.  Psychiatric/Behavioral:  Negative for confusion and hallucinations.   See other pertinent positives and negatives in HPI.  Current Outpatient Medications on File Prior to Visit  Medication Sig Dispense Refill   docusate sodium  (COLACE) 100 MG capsule Take 100 mg by mouth at bedtime.     DULoxetine  (CYMBALTA ) 60 MG  capsule TAKE 1 CAPSULE BY MOUTH EVERY DAY 90 capsule 3   esomeprazole  (NEXIUM ) 40 MG capsule TAKE 1 CAPSULE (40 MG TOTAL) BY MOUTH 2 (TWO) TIMES DAILY BEFORE A MEAL. 180 capsule 1   lovastatin  (MEVACOR ) 20 MG tablet TAKE 1 TABLET BY MOUTH EVERYDAY AT BEDTIME 90 tablet 1   Melatonin 10 MG TABS Take 20 mg by mouth at bedtime as needed (for sleeo).      Multiple Vitamin (MULTIVITAMIN WITH MINERALS) TABS tablet Take 1 tablet by mouth daily.     olmesartan  (BENICAR ) 20 MG tablet TAKE 1 TABLET BY MOUTH EVERY DAY 90 tablet 3   Oxycodone  HCl 10 MG TABS Take 1 tablet (10 mg total) by mouth 2 (two) times daily. 60 tablet 0   traZODone  (DESYREL ) 50 MG tablet TAKE 1 TABLET BY MOUTH EVERYDAY AT BEDTIME 90 tablet 2   Vibegron (GEMTESA) 75 MG TABS      [Paused] aspirin  EC 81 MG tablet Take 1 tablet (81 mg total) by mouth daily. Swallow whole. (Patient not taking: Reported on 10/24/2024) 30 tablet 11   levETIRAcetam  (KEPPRA ) 500 MG tablet Take 1 tablet (500 mg total) by mouth 2 (two) times daily. (Patient not taking: Reported on 10/24/2024) 60 tablet 0   No current facility-administered medications on file prior to visit.    Past Medical History:  Diagnosis Date   Barrett's esophagus    Cerebrovascular accident (CVA) due to thrombosis of left carotid artery (HCC) 09/23/2015   Chronic back pain    CVA (cerebral vascular accident) (HCC) 09/22/2016   Depression    Gastritis    Mild   Gastroparesis    GERD (gastroesophageal reflux disease)    Headache    History of kidney stones    Hyperglycemia    Hypertension    ICH (intracerebral hemorrhage) (HCC) - R thalmic/PLIC d/t HTN 93/98/7980   Lacunar infarct, acute (HCC) 08/25/2015   Nephrolithiasis    Pneumonia    Covid pneumonia   Renal cyst    Sleep apnea    Stricture and stenosis of esophagus    Stroke (HCC) 07/2015   Stroke-like symptom 09/22/2016   Treatment-emergent central sleep apnea 10/25/2018   Vision abnormalities    No Known Allergies  Social History   Socioeconomic History   Marital status: Married    Spouse name: Richard Vaughn   Number of children: 3   Years of education: Not on file   Highest education level: 12th grade  Occupational History   Occupation: retired    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[employer]  Tobacco Use   Smoking status: Former    Current packs/day: 0.00    Average  packs/day: 0.5 packs/day for 30.0 years (15.0 ttl pk-yrs)    Types: Cigarettes    Start date: 04/15/1988    Quit date: 04/15/2018    Years since quitting: 6.5   Smokeless tobacco: Never  Vaping Use   Vaping status: Never Used  Substance and Sexual Activity   Alcohol use: No    Alcohol/week: 0.0 standard drinks of alcohol   Drug use: Never   Sexual activity: Yes  Other Topics Concern   Not on file  Social History Narrative   Not on file   Social Drivers of Health   Financial Resource Strain: Low Risk  (10/05/2024)   Overall Financial Resource Strain (CARDIA)    Difficulty of Paying Living Expenses: Not hard at all  Food Insecurity: No Food Insecurity (10/22/2024)   Hunger Vital Sign  Worried About Programme Researcher, Broadcasting/film/video in the Last Year: Never true    Ran Out of Food in the Last Year: Never true  Transportation Needs: No Transportation Needs (10/22/2024)   PRAPARE - Administrator, Civil Service (Medical): No    Lack of Transportation (Non-Medical): No  Physical Activity: Inactive (10/05/2024)   Exercise Vital Sign    Days of Exercise per Week: 0 days    Minutes of Exercise per Session: Not on file  Stress: No Stress Concern Present (10/05/2024)   [employer] of Occupational Health - Occupational Stress Questionnaire    Feeling of Stress: Not at all  Social Connections: Moderately Isolated (10/18/2024)   Social Connection and Isolation Panel    Frequency of Communication with Friends and Family: Twice a week    Frequency of Social Gatherings with Friends and Family: Once a week    Attends Religious Services: Never    Database Administrator or Organizations: No    Attends Banker Meetings: Never    Marital Status: Married    Vitals:   10/24/24 1003  BP: 136/80  Pulse: 82  Resp: 16  Temp: 97.9 F (36.6 C)   Body mass index is 31.12 kg/m.  Physical Exam Vitals and nursing note reviewed.  Constitutional:      General: He is not in acute  distress.    Appearance: He is well-developed. He is not ill-appearing.  HENT:     Head: Normocephalic and atraumatic.     Mouth/Throat:     Mouth: Mucous membranes are moist.  Eyes:     Conjunctiva/sclera: Conjunctivae normal.  Cardiovascular:     Rate and Rhythm: Normal rate. Rhythm irregular. Occasional Extrasystoles are present.    Heart sounds: No murmur heard.    Comments: PT pulses palpable. Pulmonary:     Effort: Pulmonary effort is normal. No respiratory distress.     Breath sounds: Normal breath sounds.  Abdominal:     Palpations: Abdomen is soft. There is no mass.     Tenderness: There is no abdominal tenderness.  Musculoskeletal:     Right lower leg: No edema.     Left lower leg: No edema.  Skin:    General: Skin is warm.     Findings: No erythema or rash.  Neurological:     Mental Status: He is alert and oriented to person, place, and time.     Comments: Left-sided weakness and slurred speech residual from CVA. In a wheel chair.  Psychiatric:        Mood and Affect: Mood and affect normal.        Speech: Speech is slurred.   ASSESSMENT AND PLAN:  Mr. Gatlin Kittell was seen today for hospitalization follow-up.  Diagnoses and all orders for this visit:  Orders Placed This Encounter  Procedures   Ambulatory referral to Cardiology   Hemiparesis affecting left side as late effect of cerebrovascular accident [facility]) Assessment & Plan: Recent episode of worsening slurred speech and confusion that lasted a few hours, evaluated in the ED, brain imaging negative for acute CVA. He is back to his baseline.  Aspirin  has been held due to subdural hematoma. Continue Lovastatin  20 mg daily and aggressive management of CVD risk factors. Follows with neurologist regularly.  Chronic subdural hematoma (HCC) Assessment & Plan: S/P  uncomplicated left MMA embolization on 10/18/2024. Tolerated procedure well, no complications. Has appt with neurosurgeon in a couple  weeks. Instructed about warning signs.  Essential hypertension Assessment & Plan: Some elevated DBP's at home, gradually improving after hospital discharged. Lower SBP at home. Continue Olmesartan  20 mg daily and low salt diet. Continue monitoring BP regularly.  Elevated alkaline phosphatase level Mild. Will plan on re-checking it next visit.  Cardiac arrhythmia, unspecified cardiac arrhythmia type He has had episodes of tachycardia, today occasional extrasystoles noted on auscultations, every 5th beat. Asymptomatic. Instructed about warning signs. Recently evaluated in the ED for TIA like symptoms. Appt with cardiologist will be arranged.  -     Ambulatory referral to Cardiology  Return in about 3 months (around 01/22/2025) for chronic problems.  Lashae Wollenberg G. Chayanne Speir, MD  [facility] [place] office.

## 2024-10-24 NOTE — Progress Notes (Signed)
 This patient returns to my office for at risk foot care.  This patient requires this care by a professional since this patient will be at risk due to having  CVA, PAD and hyperglycemia. He presents to the office with his wife.  This patient is unable to cut nails himself since the patient cannot reach his nails.These nails are painful walking and wearing shoes.  This patient presents for at risk foot care today.  General Appearance  Alert, conversant and in no acute stress.  Vascular  Dorsalis pedis and posterior tibial  pulses are palpable  bilaterally.  Capillary return is within normal limits  bilaterally. Temperature is within normal limits  bilaterally.  Neurologic  Senn-Weinstein monofilament wire test within normal limits  bilaterally. Muscle power within normal limits bilaterally.  Nails Thick disfigured discolored nails with subungual debris  from hallux to fifth toes bilaterally. No evidence of bacterial infection or drainage bilaterally.  Orthopedic  No limitations of motion  feet .  No crepitus or effusions noted.  Hammer toes  B/l.  Skin  normotropic skin with no porokeratosis noted bilaterally.  No signs of infections or ulcers noted.     Onychomycosis  Pain in right toes  Pain in left toes  Consent was obtained for treatment procedures.   Mechanical debridement of nails 1-5  bilaterally performed with a nail nipper.  Filed with dremel without incident.    Return office visit    10 weeks                 Told patient to return for periodic foot care and evaluation due to potential at risk complications.   Helane Gunther DPM

## 2024-10-25 NOTE — Assessment & Plan Note (Signed)
 S/P  uncomplicated left MMA embolization on 10/18/2024. Tolerated procedure well, no complications. Has appt with neurosurgeon in a couple weeks. Instructed about warning signs.

## 2024-10-25 NOTE — Assessment & Plan Note (Signed)
 Some elevated DBP's at home, gradually improving after hospital discharged. Lower SBP at home. Continue Olmesartan  20 mg daily and low salt diet. Continue monitoring BP regularly.

## 2024-10-25 NOTE — Assessment & Plan Note (Signed)
 Aspirin  has been held due to recent procedure. Continue Lovastatin  20 mg daily and aggressive management of CVD risk factors.

## 2024-10-27 NOTE — Progress Notes (Addendum)
 "  Card[facility]:    Date:  10/31/2024   ID:  Richard Vaughn, DOB [birth year], MRN [id number]  PCP:  Jordan, [name], MD  Cardiologist:  New - Dr. Francyne  Referring MD: Jordan, [name], MD   Chief Complaint: tachycardia  History of Present Illness:    Richard Vaughn is a 72 y.o. 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  Patient has a history of recurrent CVA. He was admitted for an acute ischemic stroke in 07/2015, TIA in 09/2016, intracranial hemorrhage in 04/2018, recurrent intracranial hemorrhage in 02/2020, and subdural hematoma in 08/2024. Carotid doppler at time of initial stroke in 2016 showed mild stenosis (1-39%) of bilateral ICAs but most recent carotid dopplers in 10/2021 near normal arteries. Last Echo in 02/2020 showed LVEF of 60-65% with moderate LVH of the basal-septal segment, normal RV function, and no significant valvular disease. Intracranial hemorrhage was felt to be due to hypertension and smoking. Conservative management was recommended for recent subdural hematoma in 08/2024. However, repeat head CT on 10/16/2024 for an episode of altered mental status showed persistent subdural hematoma with mild mass effect and trace rightward midline shift. Therefore, he underwent left MMA embolization by IR on 10/18/2024.   At recent visit with PCP on 10/24/2024, he reported intermittent episodes of tachycardia. Therefore, he was referred to Cardiology for further evaluation.  Today, he is here with his wife who is a [employer]DR[employer]ISER and assists with the history. Wife reports patient  had intermittent episodes of diaphoresis and tachycardia with rates in the 130s and difficulty moving his left leg in 08/2024. This is what ultimately led to the hospitalization where he was found to have a subdural hematoma. Symptoms did resolve after this. However, wife has noticed that ever since his left MMA embolization earlier this month, his heart rates go up easily to the 100s to 110s with walking. He denies any recurrent diaphoresis and is asymptomatic with this. They only know his heart rate is elevated because of the pulse oximeter reading. Patient states he previously wore a heart monitor many years ago prior his first stroke but nothing since.  He has a history of tobacco use but quit after his hemorrhagic stroke in 2021. He denies any alcohol use or drug use.  He does have a strong family history of stroke in his father, mother, and 3 of his sibling. One of his sisters was diagnosed with CADASIL (cerebral autosomal dominant arteriopathy with subcortical infarcts and leukoencephalopathy). He has never been tested for this.  ROS: No chest pain, shortness of breath, orthopnea, PND, lower extremity edema, palpitations, lightheadedness, dizziness, syncope.   EKGs/Labs/Other Studies Reviewed:    The following studies were reviewed:  Echocardiogram 02/23/2020: Impressions: 1. Left ventricular ejection fraction, by estimation, is 60 to 65%. The  left ventricle has normal function. The left ventricle has no regional  wall motion abnormalities. There is moderate left ventricular hypertrophy  of the basal-septal segment. Left  ventricular diastolic parameters were normal.   2. Right ventricular systolic function is normal. The right ventricular  size is normal. Tricuspid regurgitation signal is inadequate for assessing  PA  pressure.   3. The mitral valve is normal in structure. Trivial mitral valve  regurgitation. No evidence of mitral stenosis.   4. The aortic valve was not well  visualized. Aortic valve regurgitation  is not visualized. Mild aortic valve sclerosis is present, with no  evidence of aortic valve stenosis.   5. The inferior vena cava is dilated in size with >50% respiratory  variability, suggesting right atrial pressure of 8 mmHg.  _______________  Carotid Dopplers 11/02/2021: Summary:  - Right Carotid: The extracranial vessels were near-normal with only minimal  wall thickening or plaque.  - Left Carotid: The extracranial vessels were near-normal with only minimal  wall thickening or plaque.  - Vertebrals:  Bilateral vertebral arteries demonstrate antegrade flow.  - Subclavians: Normal flow hemodynamics were seen in bilateral subclavian  arteries.    EKG:  EKG ordered today.   EKG Interpretation Date/Time:  Wednesday October 31 2024 14:32:59 EST Ventricular Rate:  91 PR Interval:  182 QRS Duration:  100 QT Interval:  348 QTC Calculation: 428 R Axis:   90  Text Interpretation: Sinus rhythm with occasional Premature ventricular complexes Rightward axis  Isolated mild T wave inversions in lead III Confirmed by Jadine Patient [phone]) on 10/31/2024 5:34:16 PM    Recent Labs: 05/04/2024: B Natriuretic Peptide 15.7 08/16/2024: Magnesium  2.1 08/17/2024: TSH 0.485 10/16/2024: ALT 15; BUN 10; Creatinine, Ser 1.01; Hemoglobin 14.8; Platelets 207; Potassium 3.7; Sodium 139  Recent Lipid Panel    Component Value Date/Time   CHOL 143 12/05/2023 1530   CHOL 142 05/15/2018 1329   TRIG 93.0 12/05/2023 1530   TRIG 121 11/01/2006 1056   HDL 44.60 12/05/2023 1530   HDL 30 (L) 05/15/2018 1329   CHOLHDL 3 12/05/2023 1530   VLDL 18.6 12/05/2023 1530   LDLCALC 80 12/05/2023 1530   LDLCALC 87 05/15/2018 1329    Physical Exam:    Vital Signs: BP 118/86   Pulse 80   Ht 6' 2 (1.88 m)   Wt 242 lb 6.4 oz (110 kg)   SpO2 94%   BMI 31.12 kg/m     Wt Readings from Last 3 Encounters:  10/31/24 242 lb 6.4 oz (110 kg)  10/24/24 242 lb 6.4 oz (110  kg)  10/22/24 242 lb 6.4 oz (110 kg)     General: 73 y.o. African-American male in no acute distress. HEENT: Normocephalic and atraumatic. Sclera clear.  Neck: Supple. No carotid bruits. No JVD. Heart: RRR with occasional ectopy. Distinct S1 and S2. No murmurs, gallops, or rubs.  Lungs: No increased work of breathing. Clear to ausculation bilaterally. No wheezes, rhonchi, or rales.  Extremities: No lower extremity edema.  Radial and distal pedal pulses 2+ and equal bilaterally. Neuro: Residual dysarthria and and left sided weakness from prior stroke. No acute focal deficits.  Psych: Normal affect. Responds appropriately.   Assessment:    1. Tachycardia, paroxysmal (HCC)   2. Primary hypertension   3. Hyperlipidemia, unspecified hyperlipidemia type   4. Recurrent cerebrovascular accidents (CVAs) (HCC)     Plan:    Tachycardia Patient was having tachycardia with rates in the 130s and diaphoresis back in 08/2024. This has now resolved, but wife has now noticed that heart rates increase quickly to the 100s to 110s while ambulating.  - EKG today shows normal sinus rhythm, rate 91 bpm, with PVCs. - Suspect mild increase in heart rate while ambulating most recently is due to deconditioning. However,  will order a 2 week Zio monitor to rule  out tachyarrhythmias. Will also order Echo to assess for structural heart disease. - Of note, patient has a history of ischemic stroke, intracranial hemorrhage x2, and recent subdural hematoma. He reports he wore a monitor many years ago prior to his first stroke but it does not look like he has worn a cardiac monitor since then. Therefore, Zio monitor will help us  screen for atrial fibrillation.  Hypertension BP mostly well controlled but wife reports mildly elevated readings with diastolic BP in the 90s at night. - Continue Olmesartan  20mg  daily.  - He was previously on Amlodipine  2.5mg  daily at night but his PCP stopped this in 08/2024 to help prevent  cerebral hypoperfusion. Recommended discussing restarting Amlodipine  with PCP if okay from a neurologic standpoint.  Hyperlipidemia Lipid panel in 11/2023: Total Cholesterol 143, Triglycerides 93, HDL 44.6, LDL 80. LDL goal <55. - Currently on Lovastatin  20mg  daily. Will stop this and switch to Lipitor  40mg  daily.  - Will repeat lipid panel and LFTs in 2-3 months.   Recurrent CVA Patient has a history of recurrent CVA including acute ischemic stroke, TIA, intracranial hemorrhage x2, and most recently a subdural hematoma in 08/2024. He recently underwent left MMA embolization on 10/18/2024.  - Aspirin  currently only hold in setting of subdural hematoma. Will defer this to Neurosurgery.  - Continue statin as above.  - He is followed by Dr. Mavis (Neurosurgery) and Dr. Rosemarie (Neurology).  Disposition: Follow up in 2-3 months.   Signed, Aline FORBES Door, PA-C  10/31/2024 5:54 PM    [facility]  ADDENDUM 12/05/2024: Monitor showed frequent PACs (almost 6% burden) and occasional episodes of paroxysmal atrial tachycardia (longest episode 21.6 seconds) but no atrial fibrillation/ flutter. Most of PACs were at night. Patient does have a history of sleep apnea but has not worn a CPAP machine in year. We discussed getting a repeat sleep study but patient states he will never wear a CPAP so decision was made to hold off on this. We also discussed possible loop recorder and he was interested in this. If this shows atrial fibrillation, he might be a watchman candidate if he can tolerate short course of anticoagulation. Therefore, will get him in to see Dr. Francyne for possible loop recorder placement.   BP was soft with systolic BP in the 90s during recent GI visit. This was after he had restarted low dose Amlodipine . Will stop Amlodipine  and start Toprol -XL 25mg  daily. Advised patient to take this at night.   Shanesha Bednarz E Kammi Hechler, PA-C 12/05/2024 4:03 PM  "

## 2024-10-30 ENCOUNTER — Other Ambulatory Visit: Payer: Self-pay | Admitting: Physician Assistant

## 2024-10-30 ENCOUNTER — Encounter: Payer: Self-pay | Admitting: Family Medicine

## 2024-10-30 ENCOUNTER — Other Ambulatory Visit: Payer: Self-pay

## 2024-10-30 DIAGNOSIS — K2270 Barrett's esophagus without dysplasia: Secondary | ICD-10-CM

## 2024-10-30 DIAGNOSIS — R6881 Early satiety: Secondary | ICD-10-CM

## 2024-10-30 DIAGNOSIS — R1312 Dysphagia, oropharyngeal phase: Secondary | ICD-10-CM

## 2024-10-30 NOTE — Transitions of Care (Post Inpatient/ED Visit) (Signed)
 Transition of Care week 2  Visit Note  10/30/2024  Name: Richard Vaughn MRN: [id number]          DOB: 29-Mar-1951  Situation: Patient enrolled in [facility] 30-day program. Visit completed with wife and patient by telephone.   Background:   Initial Transition Care Management Follow-up Telephone Call Discharge Date and Diagnosis: 10/19/24, Chronic subdural hematoma   Past Medical History:  Diagnosis Date   Barrett's esophagus    Cerebrovascular accident (CVA) due to thrombosis of left carotid artery (HCC) 09/23/2015   Chronic back pain    CVA (cerebral vascular accident) (HCC) 09/22/2016   Depression    Gastritis    Mild   Gastroparesis    GERD (gastroesophageal reflux disease)    Headache    History of kidney stones    Hyperglycemia    Hypertension    ICH (intracerebral hemorrhage) (HCC) - R thalmic/PLIC d/t HTN 93/98/7980   Lacunar infarct, acute (HCC) 08/25/2015   Nephrolithiasis    Pneumonia    Covid pneumonia   Renal cyst    Sleep apnea    Stricture and stenosis of esophagus    Stroke (HCC) 07/2015   Stroke-like symptom 09/22/2016   Treatment-emergent central sleep apnea 10/25/2018   Vision abnormalities    Today's Vitals   10/30/24 1019  BP: 124/87  Pulse: 79  PainSc: 0-No pain    Assessment:  Patient back to baseline per wife.   No new concerns.   I am able to understand patient on the phone.  Pending cardiology visit tomorrow.  Patient Reported Symptoms: Cognitive Cognitive Status: Other: (per wife awake and alert I am able to understand speech.)      Neurological Neurological Review of Symptoms: No symptoms reported Oher Neurological Symptoms/Conditions [RPT]: sppeech is good per wife. No headaches.  Feeling better    HEENT HEENT Symptoms Reported: Not assessed      Cardiovascular Cardiovascular Symptoms Reported: Other: Other Cardiovascular Symptoms: heart rate goes up and down per wife.  Mostly with walking. HR up to 107-110.  At rest 68-84.  No  symptoms. Does patient have uncontrolled Hypertension?: No Cardiovascular Management Strategies: Routine screening, Medication therapy Cardiovascular Self-Management Outcome: 5 (very good)  Respiratory Respiratory Symptoms Reported: No symptoms reported Respiratory Management Strategies: Routine screening Respiratory Self-Management Outcome: 5 (very good)  Endocrine Endocrine Symptoms Reported: No symptoms reported Is patient diabetic?: No Endocrine Self-Management Outcome: 5 (very good)  Gastrointestinal Gastrointestinal Symptoms Reported: No symptoms reported Gastrointestinal Self-Management Outcome: 5 (very good)    Genitourinary Genitourinary Symptoms Reported: Incontinence    Integumentary Integumentary Symptoms Reported: No symptoms reported Skin Management Strategies: Routine screening  Musculoskeletal Musculoskelatal Symptoms Reviewed: Unsteady gait Additional Musculoskeletal Details: using walker Musculoskeletal Management Strategies: Routine screening, Medical device      Psychosocial Psychosocial Symptoms Reported: No symptoms reported         Today's Vitals   10/30/24 1019  BP: 124/87  Pulse: 79   Pain Scale: 0-10 Pain Score: 0-No pain  Medications Reviewed Today     Reviewed by Rumalda Alan PENNER, RN (Registered Nurse) on 10/30/24 at 1011  Med List Status: <None>   Medication Order Taking? Sig Documenting Provider Last Dose Status Informant  aspirin  EC 81 MG tablet [id number]  Take 1 tablet (81 mg total) by mouth daily. Swallow whole.  Patient not taking: Reported on 10/24/2024   Whitfield [name], NP  Active Self, Spouse/Significant Other, Pharmacy Records, Multiple Informants  docusate sodium  (COLACE) 100 MG capsule [id number] Yes Take 100  mg by mouth at bedtime. [provider]  Active Self, Spouse/Significant Other, Pharmacy Records, Multiple Informants  DULoxetine  (CYMBALTA ) 60 MG capsule [id number] Yes TAKE 1 CAPSULE BY MOUTH EVERY DAY Jordan, [name], MD   Active Self, Spouse/Significant Other, Pharmacy Records, Multiple Informants  esomeprazole  (NEXIUM ) 40 MG capsule [id number] Yes TAKE 1 CAPSULE (40 MG TOTAL) BY MOUTH 2 (TWO) TIMES DAILY BEFORE A MEAL. Craig Alan SAUNDERS, PA-C  Active Self, Spouse/Significant Other, Pharmacy Records, Multiple Informants  levETIRAcetam  (KEPPRA ) 500 MG tablet [id number]  Take 1 tablet (500 mg total) by mouth 2 (two) times daily.  Patient not taking: Reported on 10/30/2024   Odell Balls, PA-C  Active   lovastatin  (MEVACOR ) 20 MG tablet [id number] Yes TAKE 1 TABLET BY MOUTH EVERYDAY AT BEDTIME Jordan, [name], MD  Active Self, Spouse/Significant Other, Pharmacy Records, Multiple Informants  Melatonin 10 MG TABS [id number] Yes Take 20 mg by mouth at bedtime as needed (for sleeo). [provider]  Active Self, Spouse/Significant Other, Pharmacy Records, Multiple Informants  Multiple Vitamin (MULTIVITAMIN WITH MINERALS) TABS tablet [id number] Yes Take 1 tablet by mouth daily. [provider]  Active Self, Spouse/Significant Other, Pharmacy Records, Multiple Informants  olmesartan  (BENICAR ) 20 MG tablet [id number] Yes TAKE 1 TABLET BY MOUTH EVERY DAY Jordan, [name], MD  Active Self, Spouse/Significant Other, Pharmacy Records, Multiple Informants  Oxycodone  HCl 10 MG TABS [id number] Yes Take 1 tablet (10 mg total) by mouth 2 (two) times daily. Jordan, [name], MD  Active   traZODone  (DESYREL ) 50 MG tablet [id number] Yes TAKE 1 TABLET BY MOUTH EVERYDAY AT BEDTIME Jordan, [name], MD  Active Self, Spouse/Significant Other, Pharmacy Records, Multiple Informants  [facility]) 75 MG TABS [id number] Yes  [provider]  Active Self, Spouse/Significant Other, Pharmacy Records, Multiple Informants            Goals Addressed             This Visit's Progress    VBCI Transitions of Care (TOC) Care Plan       Problems:  Recent Hospitalization for treatment of subdural hematoma  embolization12/16/2025  Doing much better,  speech and ambulation is better..   No new issues.  No taking Keppra .    No Hospital Follow Up Provider appointment wife to call and schedule  10/30/2024 Completed TOC visit with PCP.  Referral placed for cardiology- Office visit scheduled for 10/30/2024  Goal:  Over the next 30 days, the patient will not experience hospital readmission  Interventions:  Transitions of Care: Doctor Visits  - discussed the importance of doctor visits Reviewed Signs and symptoms of infection Assessed Vitals signs and pain Reviewed use of walker. Confirmed medications- currently not taking Keppra .    Subdural hematoma Reviewed Importance of taking all medications as prescribed Reviewed Importance of attending all scheduled provider appointments Advised to report any changes in symptoms or exercise tolerance Assessed for signs and symptoms of stroke Assessed for management of bladder and/or bowel incontinence Assessed for fall status and safety in the home Reviewed signs of stroke-  wife a nurse and is aware. Doing a great job caring for patient.  Reviewed pending cardiology appointment tomorrow.    Patient Self Care Activities:  Attend all scheduled provider appointments Call pharmacy for medication refills 3-7 days in advance of running out of medications Call provider office for new concerns or questions  Notify RN Care Manager of TOC call rescheduling needs Participate in Transition of Care Program/Attend TOC scheduled calls Take  medications as prescribed   Call 911 for changes in mental status Use walker to prevent falls.  Enjoy your Birthday!  Plan:  Telephone follow up appointment with care management team member scheduled for:  11/06/2024 The patient has been provided with contact information for the care management team and has been advised to call with any health related questions or concerns.         Recommendation:   Continue Current  Plan of Care  Follow Up Plan:   Telephone follow up appointment date/time:  11/06/2024 at 10 am  Alan Ee, RN, BSN, [employer]- Transition of Care Team.  [facility] [phone]

## 2024-10-30 NOTE — Transitions of Care (Post Inpatient/ED Visit) (Deleted)
° °  10/30/2024  Name: Richard Vaughn MRN: [id number] DOB: May 13, 1951  {[id number]}

## 2024-10-30 NOTE — Patient Instructions (Signed)
 Visit Information  Thank you for taking time to visit with me today. Please don't hesitate to contact me if I can be of assistance to you before our next scheduled telephone appointment.  Following are the goals we discussed today:   Goals Addressed             This Visit's Progress    VBCI Transitions of Care (TOC) Care Plan       Problems:  Recent Hospitalization for treatment of subdural hematoma embolization12/16/2025  Doing much better,  speech and ambulation is better..   No new issues.  No taking Keppra .    No Hospital Follow Up Provider appointment wife to call and schedule  10/30/2024 Completed TOC visit with PCP.  Referral placed for cardiology- Office visit scheduled for 10/30/2024  Goal:  Over the next 30 days, the patient will not experience hospital readmission  Interventions:  Transitions of Care: Doctor Visits  - discussed the importance of doctor visits Reviewed Signs and symptoms of infection Assessed Vitals signs and pain Reviewed use of walker. Confirmed medications- currently not taking Keppra .    Subdural hematoma Reviewed Importance of taking all medications as prescribed Reviewed Importance of attending all scheduled provider appointments Advised to report any changes in symptoms or exercise tolerance Assessed for signs and symptoms of stroke Assessed for management of bladder and/or bowel incontinence Assessed for fall status and safety in the home Reviewed signs of stroke-  wife a nurse and is aware. Doing a great job caring for patient.  Reviewed pending cardiology appointment tomorrow.    Patient Self Care Activities:  Attend all scheduled provider appointments Call pharmacy for medication refills 3-7 days in advance of running out of medications Call provider office for new concerns or questions  Notify RN Care Manager of TOC call rescheduling needs Participate in Transition of Care Program/Attend TOC scheduled calls Take medications as  prescribed   Call 911 for changes in mental status Use walker to prevent falls.  Enjoy your Birthday!  Plan:  Telephone follow up appointment with care management team member scheduled for:  11/06/2024 The patient has been provided with contact information for the care management team and has been advised to call with any health related questions or concerns.          Our next appointment is by telephone on 11/06/2024  at 10 am  Please call the care guide team at [phone] if you need to cancel or reschedule your appointment.   If you are experiencing a Mental Health or Behavioral Health Crisis or need someone to talk to, please call the Suicide and Crisis Lifeline: 988 call the [place]  National Suicide Prevention Lifeline: 813 885 9986 or TTY: (718)809-1059 TTY 3094034696) to talk to a trained counselor call 1-800-273-TALK (toll free, 24 hour hotline) call 911   Care plan and visit instructions communicated with the patient verbally today. Patient agrees to receive a copy in MyChart. Active MyChart status and patient understanding of how to access instructions and care plan via MyChart confirmed with patient.     Alan Ee, RN, BSN, CEN [employer]- Transition of Care Team.  [facility] [phone]

## 2024-10-31 ENCOUNTER — Encounter: Payer: Self-pay | Admitting: Student

## 2024-10-31 ENCOUNTER — Ambulatory Visit

## 2024-10-31 ENCOUNTER — Encounter: Payer: Self-pay | Admitting: *Deleted

## 2024-10-31 ENCOUNTER — Ambulatory Visit: Attending: Student | Admitting: Student

## 2024-10-31 VITALS — BP 118/86 | HR 80 | Ht 74.0 in | Wt 242.4 lb

## 2024-10-31 DIAGNOSIS — I639 Cerebral infarction, unspecified: Secondary | ICD-10-CM | POA: Diagnosis not present

## 2024-10-31 DIAGNOSIS — I479 Paroxysmal tachycardia, unspecified: Secondary | ICD-10-CM

## 2024-10-31 DIAGNOSIS — I10 Essential (primary) hypertension: Secondary | ICD-10-CM

## 2024-10-31 DIAGNOSIS — E785 Hyperlipidemia, unspecified: Secondary | ICD-10-CM

## 2024-10-31 DIAGNOSIS — R002 Palpitations: Secondary | ICD-10-CM

## 2024-10-31 NOTE — Telephone Encounter (Signed)
 Spoke with pt and pt wife and notified of recommendations.  Pt was scheduled for an office visit with Alan Coombs PA on 11/30/2024 at 2:30 pm.  Pt wife made aware.  Pt wife stated that he had only two pills left of the omeprazole .   Please review and advise on 1 month refill prior to office visit.

## 2024-10-31 NOTE — Progress Notes (Unsigned)
 Enrolled patient for a 14 day Zio XT monitor to be mailed to patients home.   Cro[place]u to read

## 2024-10-31 NOTE — Patient Instructions (Signed)
 Medication Instructions:  Your physician has recommended you make the following change in your medication:  STOP Lovastatin  START Atorvastatin  40 mg taking 1 daily  *If you need a refill on your cardiac medications before your next appointment, please call your pharmacy*  Lab Work: COME TO YOUR APPOINTMENT IN MARCH FASTING SO WE CAN CHECK YOUR BLOOD WORK  If you have labs (blood work) drawn today and your tests are completely normal, you will receive your results only by: MyChart Message (if you have MyChart) OR A paper copy in the mail If you have any lab test that is abnormal or we need to change your treatment, we will call you to review the results.  Testing/Procedures: Your physician has requested that you have an echocardiogram. Echocardiography is a painless test that uses sound waves to create images of your heart. It provides your doctor with information about the size and shape of your heart and how well your hearts chambers and valves are working. This procedure takes approximately one hour. There are no restrictions for this procedure. Please do NOT wear cologne, perfume, aftershave, or lotions (deodorant is allowed). Please arrive 15 minutes prior to your appointment time.  Please note: We ask at that you not bring children with you during ultrasound (echo/ vascular) testing. Due to room size and safety concerns, children are not allowed in the ultrasound rooms during exams. Our front office staff cannot provide observation of children in our lobby area while testing is being conducted. An adult accompanying a patient to their appointment will only be allowed in the ultrasound room at the discretion of the ultrasound technician under special circumstances. We apologize for any inconvenience.   ZIO XT- Long Term Monitor Instructions  Your physician has requested you wear a ZIO patch monitor for 14 days.  This is a single patch monitor. [employer] supplies one patch monitor per  enrollment. Additional stickers are not available. Please do not apply patch if you will be having a Nuclear Stress Test,  Echocardiogram, Cardiac CT, MRI, or Chest Xray during the period you would be wearing the  monitor. The patch cannot be worn during these tests. You cannot remove and re-apply the  ZIO XT patch monitor.  Your ZIO patch monitor will be mailed 3 day USPS to your address on file. It may take 3-5 days  to receive your monitor after you have been enrolled.  Once you have received your monitor, please review the enclosed instructions. Your monitor  has already been registered assigning a specific monitor serial # to you.  Billing and Patient Assistance Program Information  We have supplied [employer] with any of your insurance information on file for billing purposes. [employer] offers a sliding scale Patient Assistance Program for patients that do not have  insurance, or whose insurance does not completely cover the cost of the ZIO monitor.  You must apply for the Patient Assistance Program to qualify for this discounted rate.  To apply, please call [employer] at [phone], select option 4, select option 2, ask to apply for  Patient Assistance Program. Meredeth will ask your household income, and how many people  are in your household. They will quote your out-of-pocket cost based on that information.  [employer] will also be able to set up a 45-month, interest-free payment plan if needed.  Applying the monitor   Shave hair from upper left chest.  Hold abrader disc by orange tab. Rub abrader in 40 strokes over the upper left chest as  indicated  in your monitor instructions.  Clean area with 4 enclosed alcohol pads. Let dry.  Apply patch as indicated in monitor instructions. Patch will be placed under collarbone on left  side of chest with arrow pointing upward.  Rub patch adhesive wings for 2 minutes. Remove white label marked 1. Remove the white  label marked 2. Rub patch  adhesive wings for 2 additional minutes.  While looking in a mirror, press and release button in center of patch. A small green light will  flash 3-4 times. This will be your only indicator that the monitor has been turned on.  Do not shower for the first 24 hours. You may shower after the first 24 hours.  Press the button if you feel a symptom. You will hear a small click. Record Date, Time and  Symptom in the Patient Logbook.  When you are ready to remove the patch, follow instructions on the last 2 pages of Patient  Logbook. Stick patch monitor onto the last page of Patient Logbook.  Place Patient Logbook in the blue and white box. Use locking tab on box and tape box closed  securely. The blue and white box has prepaid postage on it. Please place it in the mailbox as  soon as possible. Your physician should have your test results approximately 7 days after the  monitor has been mailed back to [facility].  Call [facility] Customer Care at [phone] if you have questions regarding  your ZIO XT patch monitor. Call them immediately if you see an orange light blinking on your  monitor.  If your monitor falls off in less than 4 days, contact our Monitor department at [phone].  If your monitor becomes loose or falls off after 4 days call [employer] at [phone] for  suggestions on securing your monitor   Follow-Up: At [facility], you and your health needs are our priority.  As part of our continuing mission to provide you with exceptional heart care, our providers are all part of one team.  This team includes your primary Cardiologist (physician) and Advanced Practice Providers or APPs (Physician Assistants and Nurse Practitioners) who all work together to provide you with the care you need, when you need it.  Your next appointment:   3 month(s)  Provider:   Callie Goodrich, PA-C          We recommend signing up for the patient portal called MyChart.  Sign up  information is provided on this After Visit Summary.  MyChart is used to connect with patients for Virtual Visits (Telemedicine).  Patients are able to view lab/test results, encounter notes, upcoming appointments, etc.  Non-urgent messages can be sent to your provider as well.   To learn more about what you can do with MyChart, go to [email or web address].   Other Instructions

## 2024-11-01 NOTE — Telephone Encounter (Signed)
 Reviewed chart & patient is not currently on omeprazole , only esomeprazole . Confirmed with wife. There is already an additional refill in so patient should have enough medication for at least 2-3 months. Advised wife to keep OV as scheduled, she verbalized all understanding.

## 2024-11-05 ENCOUNTER — Ambulatory Visit: Admitting: Family Medicine

## 2024-11-06 ENCOUNTER — Other Ambulatory Visit: Payer: Self-pay | Admitting: Family Medicine

## 2024-11-06 ENCOUNTER — Other Ambulatory Visit: Payer: Self-pay

## 2024-11-06 MED ORDER — AMLODIPINE BESYLATE 2.5 MG PO TABS: 2.5000 mg | ORAL_TABLET | Freq: Every day | ORAL | 0 refills | Status: DC

## 2024-11-06 NOTE — Patient Instructions (Signed)
 Visit Information  Thank you for taking time to visit with me today. Please don't hesitate to contact me if I can be of assistance to you before our next scheduled telephone appointment.  Following are the goals we discussed today:   Goals Addressed             This Visit's Progress    VBCI Transitions of Care (TOC) Care Plan       Problems:  Recent Hospitalization for treatment of subdural hematoma embolization12/16/2025  Doing much better,  speech and ambulation is better..   No new issues.  No taking Keppra .   11/06/2024  Wife and patient report that patient is doing well. Walking at the park this week. Eating and drinking well.  BP doing well, no falls. No new concerns today. Looking forward to going to [place] .  Saw cardiology and plan for holter monitor.  No Hospital Follow Up Provider appointment wife to call and schedule  10/30/2024 Completed TOC visit with PCP.  Referral placed for cardiology- Office visit scheduled for 10/30/2024- completed this visit  Goal:  Over the next 30 days, the patient will not experience hospital readmission  Interventions:  Transitions of Care: Doctor Visits  - discussed the importance of doctor visits Reviewed Signs and symptoms of infection Assessed Vitals signs and pain Reviewed use of walker. Confirmed medications Reviewed no pain. Reviewed that patient is feeling stronger.    Subdural hematoma Reviewed Importance of taking all medications as prescribed Reviewed Importance of attending all scheduled provider appointments Advised to report any changes in symptoms or exercise tolerance Assessed for signs and symptoms of stroke Assessed for management of bladder and/or bowel incontinence Assessed for fall status and safety in the home Reviewed signs of stroke-  and when to call 911 Reviewed progress and patient is improving.    Patient Self Care Activities:  Attend all scheduled provider appointments Call pharmacy for medication  refills 3-7 days in advance of running out of medications Call provider office for new concerns or questions  Notify RN Care Manager of TOC call rescheduling needs Participate in Transition of Care Program/Attend TOC scheduled calls Take medications as prescribed   Call 911 for changes in mental status Use walker to prevent falls.  Have a safe trip.   Plan:  Telephone follow up appointment with care management team member scheduled for:  12/312025 The patient has been provided with contact information for the care management team and has been advised to call with any health related questions or concerns.          Our next appointment is by telephone on 12/31//2025 at 1000  Please call the care guide team at [phone] if you need to cancel or reschedule your appointment.   If you are experiencing a Mental Health or Behavioral Health Crisis or need someone to talk to, please call the Suicide and Crisis Lifeline: 988 call the [place]  National Suicide Prevention Lifeline: (681)367-9941 or TTY: (256)383-6285 TTY 405-287-6135) to talk to a trained counselor call 1-800-273-TALK (toll free, 24 hour hotline) call 911   Care plan and visit instructions communicated with the patient verbally today. Patient agrees to receive a copy in MyChart. Active MyChart status and patient understanding of how to access instructions and care plan via MyChart confirmed with patient.     Alan Ee, RN, BSN, CEN [employer]- Transition of Care Team.  [facility] [phone]

## 2024-11-06 NOTE — Transitions of Care (Post Inpatient/ED Visit) (Signed)
 " Transition of Care week 3  Visit Note  11/06/2024  Name: Richard Vaughn MRN: [id number]          DOB: Mar 31, 1951  Situation: Patient enrolled in [facility] 30-day program. Visit completed with patient by telephone.   Background:   Initial Transition Care Management Follow-up Telephone Call Discharge Date and Diagnosis: 10/19/24, Chronic subdural hematoma   Past Medical History:  Diagnosis Date   Barrett's esophagus    Cerebrovascular accident (CVA) due to thrombosis of left carotid artery (HCC) 09/23/2015   Chronic back pain    CVA (cerebral vascular accident) (HCC) 09/22/2016   Depression    Gastritis    Mild   Gastroparesis    GERD (gastroesophageal reflux disease)    Headache    History of kidney stones    Hyperglycemia    Hypertension    ICH (intracerebral hemorrhage) (HCC) - R thalmic/PLIC d/t HTN 93/98/7980   Lacunar infarct, acute (HCC) 08/25/2015   Nephrolithiasis    Pneumonia    Covid pneumonia   Renal cyst    Sleep apnea    Stricture and stenosis of esophagus    Stroke (HCC) 07/2015   Stroke-like symptom 09/22/2016   Treatment-emergent central sleep apnea 10/25/2018   Vision abnormalities     Assessment: patient reports that he is doing well.  No new problems or concerns.  Patient Reported Symptoms: Cognitive Cognitive Status: Able to follow simple commands, Alert and oriented to person, place, and time, Normal speech and language skills (difficulty with speech but this is normal for patient. I am able to understand patient on the phone.)      Neurological Oher Neurological Symptoms/Conditions [RPT]: walking well. no new concerns. no headaches Neurological Management Strategies: Routine screening Neurological Self-Management Outcome: 5 (very good)  HEENT HEENT Symptoms Reported: Not assessed      Cardiovascular Cardiovascular Symptoms Reported: Other: Other Cardiovascular Symptoms: saw cardiology. wife reports some increased heart rate with walking.   Cardiology ordered holter monitor and it has arrived in the mail. Does patient have uncontrolled Hypertension?: No Cardiovascular Management Strategies: Routine screening Weight: 240 lb 3.2 oz (109 kg) Cardiovascular Self-Management Outcome: 4 (good)  Respiratory Respiratory Symptoms Reported: No symptoms reported Respiratory Self-Management Outcome: 5 (very good)  Endocrine Endocrine Symptoms Reported: No symptoms reported Is patient diabetic?: No Endocrine Self-Management Outcome: 5 (very good)  Gastrointestinal Gastrointestinal Symptoms Reported: No symptoms reported      Genitourinary Genitourinary Symptoms Reported: Incontinence    Integumentary Integumentary Symptoms Reported: No symptoms reported    Musculoskeletal Musculoskelatal Symptoms Reviewed: Unsteady gait Additional Musculoskeletal Details: using walker - walking the park per wife. had a great time and did really well. Musculoskeletal Management Strategies: Routine screening, Medical device Musculoskeletal Self-Management Outcome: 5 (very good)      Psychosocial Psychosocial Symptoms Reported: Not assessed         Today's Vitals   11/06/24 1034  BP: 131/86  Pulse: 70  Weight: 240 lb 3.2 oz (109 kg)   Pain Scale: 0-10 Pain Score: 0-No pain  Medications Reviewed Today     Reviewed by Rumalda Alan PENNER, RN (Registered Nurse) on 11/06/24 at 1011  Med List Status: <None>   Medication Order Taking? Sig Documenting Provider Last Dose Status Informant  aspirin  EC 81 MG tablet [id number]  Take 1 tablet (81 mg total) by mouth daily. Swallow whole.  Patient not taking: Reported on 11/06/2024   Whitfield [name], NP  Active Self, Spouse/Significant Other, Pharmacy Records, Multiple Informants  atorvastatin  (LIPITOR ) 40  MG tablet [id number] Yes Take 1 tablet (40 mg total) by mouth daily. Goodrich, Callie E, PA-C  Active   docusate sodium  (COLACE) 100 MG capsule [id number] Yes Take 100 mg by mouth at bedtime. [provider]  Active Self, Spouse/Significant Other, Pharmacy Records, Multiple Informants  DULoxetine  (CYMBALTA ) 60 MG capsule [id number] Yes TAKE 1 CAPSULE BY MOUTH EVERY DAY Jordan, [name], MD  Active Self, Spouse/Significant Other, Pharmacy Records, Multiple Informants  esomeprazole  (NEXIUM ) 40 MG capsule [id number] Yes TAKE 1 CAPSULE (40 MG TOTAL) BY MOUTH 2 (TWO) TIMES DAILY BEFORE A MEAL. Craig Alan SAUNDERS, PA-C  Active Self, Spouse/Significant Other, Pharmacy Records, Multiple Informants  Melatonin 10 MG TABS [id number] Yes Take 20 mg by mouth at bedtime as needed (for sleeo). [provider]  Active Self, Spouse/Significant Other, Pharmacy Records, Multiple Informants  Multiple Vitamin (MULTIVITAMIN WITH MINERALS) TABS tablet [id number] Yes Take 1 tablet by mouth daily. [provider]  Active Self, Spouse/Significant Other, Pharmacy Records, Multiple Informants  olmesartan  (BENICAR ) 20 MG tablet [id number] Yes TAKE 1 TABLET BY MOUTH EVERY DAY Jordan, [name], MD  Active Self, Spouse/Significant Other, Pharmacy Records, Multiple Informants  Oxycodone  HCl 10 MG TABS [id number] Yes Take 1 tablet (10 mg total) by mouth 2 (two) times daily. Jordan, [name], MD  Active   traZODone  (DESYREL ) 50 MG tablet [id number] Yes TAKE 1 TABLET BY MOUTH EVERYDAY AT BEDTIME Jordan, [name], MD  Active Self, Spouse/Significant Other, Pharmacy Records, Multiple Informants             Goals Addressed             This Visit's Progress    [facility] Transitions of Care (TOC) Care Plan       Problems:  Recent Hospitalization for treatment of subdural hematoma embolization12/16/2025  Doing much better,  speech and ambulation is better..   No new issues.  No taking Keppra .   11/06/2024  Wife and patient report that patient is doing well. Walking at the park this week. Eating and drinking well.  BP doing well, no falls. No new concerns today. Looking forward to going to [place] .  Saw cardiology and plan  for holter monitor.  No Hospital Follow Up Provider appointment wife to call and schedule  10/30/2024 Completed TOC visit with PCP.  Referral placed for cardiology- Office visit scheduled for 10/30/2024- completed this visit  Goal:  Over the next 30 days, the patient will not experience hospital readmission  Interventions:  Transitions of Care: Doctor Visits  - discussed the importance of doctor visits Reviewed Signs and symptoms of infection Assessed Vitals signs and pain Reviewed use of walker. Confirmed medications Reviewed no pain. Reviewed that patient is feeling stronger.    Subdural hematoma Reviewed Importance of taking all medications as prescribed Reviewed Importance of attending all scheduled provider appointments Advised to report any changes in symptoms or exercise tolerance Assessed for signs and symptoms of stroke Assessed for management of bladder and/or bowel incontinence Assessed for fall status and safety in the home Reviewed signs of stroke-  and when to call 911 Reviewed progress and patient is improving.    Patient Self Care Activities:  Attend all scheduled provider appointments Call pharmacy for medication refills 3-7 days in advance of running out of medications Call provider office for new concerns or questions  Notify RN Care Manager of TOC call rescheduling needs Participate in Transition of Care Program/Attend TOC scheduled calls Take medications as prescribed   Call 911 for  changes in mental status Use walker to prevent falls.  Have a safe trip.   Plan:  Telephone follow up appointment with care management team member scheduled for:  12/312025 The patient has been provided with contact information for the care management team and has been advised to call with any health related questions or concerns.          Recommendation:   Continue Current Plan of Care  Follow Up Plan:   Telephone follow up appointment date/time:   11/14/2024  Alan Ee, RN, BSN, CEN [facility]- Transition of Care Team.  [facility] [phone]     "

## 2024-11-07 ENCOUNTER — Other Ambulatory Visit (HOSPITAL_BASED_OUTPATIENT_CLINIC_OR_DEPARTMENT_OTHER): Payer: Self-pay | Admitting: Neurosurgery

## 2024-11-07 DIAGNOSIS — I6203 Nontraumatic chronic subdural hemorrhage: Secondary | ICD-10-CM

## 2024-11-14 ENCOUNTER — Other Ambulatory Visit: Payer: Self-pay

## 2024-11-14 NOTE — Transitions of Care (Post Inpatient/ED Visit) (Signed)
 " Transition of Care week 4  Visit Note  11/14/2024  Name: Richard Vaughn MRN: [id number]          DOB: 07-05-1951  Situation: Patient enrolled in [facility] 30-day program. Visit completed with wife by telephone.   Background:   Initial Transition Care Management Follow-up Telephone Call Discharge Date and Diagnosis: 10/19/24, Chronic subdural hematoma   Past Medical History:  Diagnosis Date   Barrett's esophagus    Cerebrovascular accident (CVA) due to thrombosis of left carotid artery (HCC) 09/23/2015   Chronic back pain    CVA (cerebral vascular accident) (HCC) 09/22/2016   Depression    Gastritis    Mild   Gastroparesis    GERD (gastroesophageal reflux disease)    Headache    History of kidney stones    Hyperglycemia    Hypertension    ICH (intracerebral hemorrhage) (HCC) - R thalmic/PLIC d/t HTN 93/98/7980   Lacunar infarct, acute (HCC) 08/25/2015   Nephrolithiasis    Pneumonia    Covid pneumonia   Renal cyst    Sleep apnea    Stricture and stenosis of esophagus    Stroke (HCC) 07/2015   Stroke-like symptom 09/22/2016   Treatment-emergent central sleep apnea 10/25/2018   Vision abnormalities     Assessment: Wife reports that patient is doing well. Has recovered well from procedure.  Reports focus now on how much recovery patient can have.   Not active with PT yet. Wife spoke with PCP about this at last office visit.  Denies any new problems or concerns today.  Patient Reported Symptoms: Cognitive Cognitive Status: Other: (phone call with wife only)      Neurological Neurological Review of Symptoms: No symptoms reported Oher Neurological Symptoms/Conditions [RPT]: with denies any neuro concerns.  eating and drinking well.  using walker Neurological Management Strategies: Routine screening Neurological Self-Management Outcome: 5 (very good)  HEENT HEENT Symptoms Reported: No symptoms reported      Cardiovascular Cardiovascular Symptoms Reported: No symptoms  reported Other Cardiovascular Symptoms: wearing his holter monitor. [place] to have elevated heart rate when ambulating. Does patient have uncontrolled Hypertension?: No Cardiovascular Management Strategies: Routine screening  Respiratory Respiratory Symptoms Reported: No symptoms reported    Endocrine Endocrine Symptoms Reported: No symptoms reported Is patient diabetic?: No Endocrine Self-Management Outcome: 5 (very good)  Gastrointestinal Gastrointestinal Symptoms Reported: No symptoms reported Additional Gastrointestinal Details: wife reports normal bowel movements. Gastrointestinal Self-Management Outcome: 5 (very good)    Genitourinary Genitourinary Symptoms Reported: Frequency, Incontinence Genitourinary Self-Management Outcome: 5 (very good)  Integumentary Integumentary Symptoms Reported: No symptoms reported Skin Management Strategies: Routine screening Skin Self-Management Outcome: 5 (very good)  Musculoskeletal Musculoskelatal Symptoms Reviewed: Unsteady gait Additional Musculoskeletal Details: continues to try to build strength        Psychosocial Psychosocial Symptoms Reported: Not assessed         Today's Vitals   11/14/24 1024  BP: 106/79  Pulse: 66   Pain Scale: 0-10 Pain Score: 0-No pain  Medications Reviewed Today     Reviewed by Rumalda Alan PENNER, RN (Registered Nurse) on 11/14/24 at 1016  Med List Status: <None>   Medication Order Taking? Sig Documenting Provider Last Dose Status Informant  amLODipine  (NORVASC ) 2.5 MG tablet [id number] Yes Take 1 tablet (2.5 mg total) by mouth daily. Jordan, [name], MD  Active   aspirin  EC 81 MG tablet [id number]  Take 1 tablet (81 mg total) by mouth daily. Swallow whole.  Patient not taking: Reported on 11/06/2024  Whitfield [name], NP  Active Self, Spouse/Significant Other, Pharmacy Records, Multiple Informants  atorvastatin  (LIPITOR ) 40 MG tablet [id number] Yes Take 1 tablet (40 mg total) by mouth daily. Goodrich,  Callie E, PA-C  Active   docusate sodium  (COLACE) 100 MG capsule [id number] Yes Take 100 mg by mouth at bedtime. [provider]  Active Self, Spouse/Significant Other, Pharmacy Records, Multiple Informants  DULoxetine  (CYMBALTA ) 60 MG capsule [id number] Yes TAKE 1 CAPSULE BY MOUTH EVERY DAY Jordan, [name], MD  Active Self, Spouse/Significant Other, Pharmacy Records, Multiple Informants  esomeprazole  (NEXIUM ) 40 MG capsule [id number] Yes TAKE 1 CAPSULE (40 MG TOTAL) BY MOUTH 2 (TWO) TIMES DAILY BEFORE A MEAL. Craig Alan SAUNDERS, PA-C  Active Self, Spouse/Significant Other, Pharmacy Records, Multiple Informants  Melatonin 10 MG TABS [id number] Yes Take 20 mg by mouth at bedtime as needed (for sleeo). [provider]  Active Self, Spouse/Significant Other, Pharmacy Records, Multiple Informants  Multiple Vitamin (MULTIVITAMIN WITH MINERALS) TABS tablet [id number] Yes Take 1 tablet by mouth daily. [provider]  Active Self, Spouse/Significant Other, Pharmacy Records, Multiple Informants  olmesartan  (BENICAR ) 20 MG tablet [id number] Yes TAKE 1 TABLET BY MOUTH EVERY DAY Jordan, [name], MD  Active Self, Spouse/Significant Other, Pharmacy Records, Multiple Informants  Oxycodone  HCl 10 MG TABS [id number] Yes Take 1 tablet (10 mg total) by mouth 2 (two) times daily. Jordan, [name], MD  Active   traZODone  (DESYREL ) 50 MG tablet [id number] Yes TAKE 1 TABLET BY MOUTH EVERYDAY AT BEDTIME Jordan, [name], MD  Active Self, Spouse/Significant Other, Pharmacy Records, Multiple Informants            Goals Addressed             This Visit's Progress    [facility] Transitions of Care (TOC) Care Plan       Problems:  Recent Hospitalization for treatment of subdural hematoma embolization12/16/2025  Doing much better,  speech and ambulation is better..   No new issues.  No taking Keppra .   11/06/2024  Wife and patient report that patient is doing well. Walking at the park this week. Eating and  drinking well.  BP doing well, no falls. No new concerns today. Looking forward to going to [place] .  Saw cardiology and plan for holter monitor. 11/14/2024  Continues to heal.  Walking in the park daily.  Currently not using a walker.   Eating and drinking well.  No Hospital Follow Up Provider appointment wife to call and schedule  10/30/2024 Completed TOC visit with PCP.  Referral placed for cardiology- Office visit scheduled for 10/30/2024- completed this visit.  11/14/2024 Currently wearing holter monitor.   Goal:  Over the next 30 days, the patient will not experience hospital readmission  Interventions:  Transitions of Care: Doctor Visits  - discussed the importance of doctor visits Reviewed Signs and symptoms of infection Assessed Vitals signs and pain Reviewed use of walker. Confirmed medications Reviewed no pain. Reviewed that patient is feeling stronger-  encouraged safe activities. No recent falls.  Reviewed follow up with Dr. Mavis- pending repeat CT     Subdural hematoma Reviewed Importance of taking all medications as prescribed Reviewed Importance of attending all scheduled provider appointments Advised to report any changes in symptoms or exercise tolerance Assessed for signs and symptoms of stroke Assessed for management of bladder and/or bowel incontinence Assessed for fall status and safety in the home Reviewed signs of stroke-  and when to call 911 Reviewed progress and patient is improving.  Reviewed swallowing and encouraged slow eating and chewing well.    Patient Self Care Activities:  Attend all scheduled provider appointments Call pharmacy for medication refills 3-7 days in advance of running out of medications Call provider office for new concerns or questions  Notify RN Care Manager of TOC call rescheduling needs Participate in Transition of Care Program/Attend TOC scheduled calls Take medications as prescribed   Call 911 for changes in mental  status Use walker to prevent falls.  Have a safe trip.   Plan:  Telephone follow up appointment with care management team member scheduled for:  11/22/2024 The patient has been provided with contact information for the care management team and has been advised to call with any health related questions or concerns.         Recommendation:   Continue Current Plan of Care  Follow Up Plan:   Telephone follow up appointment date/time:  11/22/2024  at 10 am  Alan Ee, RN, BSN, [employer]- Transition of Care Team.  [facility] [phone]     "

## 2024-11-14 NOTE — Patient Instructions (Signed)
 Visit Information  Thank you for taking time to visit with me today. Please don't hesitate to contact me if I can be of assistance to you before our next scheduled telephone appointment.  Following are the goals we discussed today:   Goals Addressed             This Visit's Progress    [facility] Transitions of Care (TOC) Care Plan       Problems:  Recent Hospitalization for treatment of subdural hematoma embolization12/16/2025  Doing much better,  speech and ambulation is better..   No new issues.  No taking Keppra .   11/06/2024  Wife and patient report that patient is doing well. Walking at the park this week. Eating and drinking well.  BP doing well, no falls. No new concerns today. Looking forward to going to [place] .  Saw cardiology and plan for holter monitor. 11/14/2024  Continues to heal.  Walking in the park daily.  Currently not using a walker.   Eating and drinking well.  No Hospital Follow Up Provider appointment wife to call and schedule  10/30/2024 Completed TOC visit with PCP.  Referral placed for cardiology- Office visit scheduled for 10/30/2024- completed this visit.  11/14/2024 Currently wearing holter monitor.   Goal:  Over the next 30 days, the patient will not experience hospital readmission  Interventions:  Transitions of Care: Doctor Visits  - discussed the importance of doctor visits Reviewed Signs and symptoms of infection Assessed Vitals signs and pain Reviewed use of walker. Confirmed medications Reviewed no pain. Reviewed that patient is feeling stronger-  encouraged safe activities. No recent falls.  Reviewed follow up with Dr. Mavis- pending repeat CT     Subdural hematoma Reviewed Importance of taking all medications as prescribed Reviewed Importance of attending all scheduled provider appointments Advised to report any changes in symptoms or exercise tolerance Assessed for signs and symptoms of stroke Assessed for management of bladder and/or bowel  incontinence Assessed for fall status and safety in the home Reviewed signs of stroke-  and when to call 911 Reviewed progress and patient is improving.  Reviewed swallowing and encouraged slow eating and chewing well.    Patient Self Care Activities:  Attend all scheduled provider appointments Call pharmacy for medication refills 3-7 days in advance of running out of medications Call provider office for new concerns or questions  Notify RN Care Manager of TOC call rescheduling needs Participate in Transition of Care Program/Attend TOC scheduled calls Take medications as prescribed   Call 911 for changes in mental status Use walker to prevent falls.  Have a safe trip.   Plan:  Telephone follow up appointment with care management team member scheduled for:  11/22/2024 The patient has been provided with contact information for the care management team and has been advised to call with any health related questions or concerns.          Our next appointment is by telephone on 11/22/2024 at 1000  Please call the care guide team at [phone] if you need to cancel or reschedule your appointment.   If you are experiencing a Mental Health or Behavioral Health Crisis or need someone to talk to, please call the Suicide and Crisis Lifeline: 988 call the [place]  National Suicide Prevention Lifeline: 414-429-5626 or TTY: (604)468-6599 TTY (478) 582-6245) to talk to a trained counselor call 1-800-273-TALK (toll free, 24 hour hotline) call 911   Care plan and visit instructions communicated with the patient verbally today. Patient agrees to receive  a copy in MyChart. Active MyChart status and patient understanding of how to access instructions and care plan via MyChart confirmed with patient.     Alan Ee, RN, BSN, CEN [employer]- Transition of Care Team.  [facility] [phone]

## 2024-11-16 ENCOUNTER — Ambulatory Visit: Admitting: Gastroenterology

## 2024-11-19 ENCOUNTER — Other Ambulatory Visit: Payer: Self-pay | Admitting: Family Medicine

## 2024-11-19 DIAGNOSIS — G8929 Other chronic pain: Secondary | ICD-10-CM

## 2024-11-19 DIAGNOSIS — G894 Chronic pain syndrome: Secondary | ICD-10-CM

## 2024-11-19 NOTE — Telephone Encounter (Unsigned)
 Copied from CRM [id number]. Topic: Clinical - Medication Refill >> Nov 19, 2024  9:09 AM Thersia BROCKS wrote: Medication: Oxycodone  HCl 10 MG TABS  Has the patient contacted their pharmacy? Yes (Agent: If no, request that the patient contact the pharmacy for the refill. If patient does not wish to contact the pharmacy document the reason why and proceed with request.) (Agent: If yes, when and what did the pharmacy advise?)  This is the patient's preferred pharmacy:  [id number] - [place],  - [address] AT [address] OF [facility] [address] [place]  [address] Phone: [phone] Fax: [phone]   Is this the correct pharmacy for this prescription? Yes If no, delete pharmacy and type the correct one.   Has the prescription been filled recently? No  Is the patient out of the medication? Yes  Has the patient been seen for an appointment in the last year OR does the patient have an upcoming appointment? Yes  Can we respond through MyChart? Yes  Agent: Please be advised that Rx refills may take up to 3 business days. We ask that you follow-up with your pharmacy.

## 2024-11-21 MED ORDER — OXYCODONE HCL 10 MG PO TABS: 10.0000 mg | ORAL_TABLET | Freq: Two times a day (BID) | ORAL | 0 refills | Status: DC

## 2024-11-22 ENCOUNTER — Other Ambulatory Visit: Payer: Self-pay

## 2024-11-22 NOTE — Patient Instructions (Signed)
 Visit Information  Thank you for taking time to visit with me today.  Following are the goals we discussed today:   Goals Addressed             This Visit's Progress    COMPLETED: [facility] Transitions of Care (TOC) Care Plan       Problems:  Recent Hospitalization for treatment of subdural hematoma embolization12/16/2025  Doing much better,  speech and ambulation is better..   No new issues.  No taking Keppra .   11/06/2024  Wife and patient report that patient is doing well. Walking at the park this week. Eating and drinking well.  BP doing well, no falls. No new concerns today. Looking forward to going to [place] .  Saw cardiology and plan for holter monitor. 11/14/2024  Continues to heal.  Walking in the park daily.  Currently not using a walker.   Eating and drinking well. 11/22/2024- wife and patient reports that patient is doing great. Feels good, no new concerns today. Walking with cane.  No Hospital Follow Up Provider appointment wife to call and schedule  10/30/2024 Completed TOC visit with PCP.  Referral placed for cardiology- Office visit scheduled for 10/30/2024- completed this visit.  11/14/2024 Currently wearing holter monitor. 11/22/2024  Holter monitor return and pending results.   Goal:  Over the next 30 days, the patient will not experience hospital readmission  Interventions:  Transitions of Care: Doctor Visits  - discussed the importance of doctor visits Reviewed use of walker and cane Confirmed medications Reviewed no pain. No recent falls.  Reviewed completion of TOC program and offered CCM and wife declines. Encouraged patient and wife to notify PCP if patient changes mind.     Subdural hematoma Reviewed Importance of taking all medications as prescribed Reviewed Importance of attending all scheduled provider appointments Advised to report any changes in symptoms or exercise tolerance Assessed for fall status and safety in the home Reviewed signs of stroke-  and  when to call 911 Reviewed progress and patient is improving.    Patient Self Care Activities:  Attend all scheduled provider appointments Call pharmacy for medication refills 3-7 days in advance of running out of medications Call provider office for new concerns or questions  Take medications as prescribed   Call 911 for changes in mental status Use walker/ cane to prevent falls.   Plan:  Patient has completed TOC program and declines further needs. Patient and or wife to call PCP for any concerns.          If you are experiencing a Mental Health or Behavioral Health Crisis or need someone to talk to, please call the Suicide and Crisis Lifeline: 988 call the [place]  National Suicide Prevention Lifeline: (782)810-1719 or TTY: (725)310-4505 TTY 9410442947) to talk to a trained counselor call 1-800-273-TALK (toll free, 24 hour hotline) call 911   Care plan and visit instructions communicated with the patient verbally today. Patient agrees to receive a copy in MyChart. Active MyChart status and patient understanding of how to access instructions and care plan via MyChart confirmed with patient.     Alan Ee, RN, BSN, CEN [employer]- Transition of Care Team.  [facility] [phone]

## 2024-11-22 NOTE — Transitions of Care (Post Inpatient/ED Visit) (Signed)
 " Transition of Care week 5  Visit Note  11/22/2024  Name: Richard Vaughn MRN: [id number]          DOB: [birth year]  Situation: Patient enrolled in [facility] 30-day program. Visit completed with patient and wife by telephone.   Background:   Initial Transition Care Management Follow-up Telephone Call Discharge Date and Diagnosis: 10/19/24, Chronic subdural hematoma   Past Medical History:  Diagnosis Date   Barrett's esophagus    Cerebrovascular accident (CVA) due to thrombosis of left carotid artery (HCC) 09/23/2015   Chronic back pain    CVA (cerebral vascular accident) (HCC) 09/22/2016   Depression    Gastritis    Mild   Gastroparesis    GERD (gastroesophageal reflux disease)    Headache    History of kidney stones    Hyperglycemia    Hypertension    ICH (intracerebral hemorrhage) (HCC) - R thalmic/PLIC d/t HTN 93/98/7980   Lacunar infarct, acute (HCC) 08/25/2015   Nephrolithiasis    Pneumonia    Covid pneumonia   Renal cyst    Sleep apnea    Stricture and stenosis of esophagus    Stroke (HCC) 07/2015   Stroke-like symptom 09/22/2016   Treatment-emergent central sleep apnea 10/25/2018   Vision abnormalities     Assessment: Patient doing really well post surgery. No new concerns today.  Patient Reported Symptoms: Cognitive Cognitive Status: Other: (phone call with wife only)      Neurological Neurological Review of Symptoms: No symptoms reported Neurological Management Strategies: Routine screening Neurological Self-Management Outcome: 5 (very good)  HEENT HEENT Symptoms Reported: No symptoms reported HEENT Management Strategies: Routine screening HEENT Self-Management Outcome: 5 (very good)    Cardiovascular Cardiovascular Symptoms Reported: No symptoms reported Cardiovascular Management Strategies: Routine screening Cardiovascular Self-Management Outcome: 5 (very good)  Respiratory Respiratory Symptoms Reported: No symptoms reported Respiratory Self-Management  Outcome: 5 (very good)  Endocrine Endocrine Symptoms Reported: No symptoms reported Is patient diabetic?: No Endocrine Self-Management Outcome: 5 (very good)  Gastrointestinal Gastrointestinal Symptoms Reported: No symptoms reported Additional Gastrointestinal Details: wife reports normal bowel movements Gastrointestinal Self-Management Outcome: 5 (very good)    Genitourinary Genitourinary Symptoms Reported: No symptoms reported    Integumentary Integumentary Symptoms Reported: No symptoms reported Skin Management Strategies: Routine screening  Musculoskeletal Musculoskelatal Symptoms Reviewed: Unsteady gait Additional Musculoskeletal Details: used cane during vacation.        Psychosocial Psychosocial Symptoms Reported: Not assessed         There were no vitals filed for this visit. Pain Scale: 0-10 Pain Score: 0-No pain  Medications Reviewed Today     Reviewed by Rumalda Alan PENNER, RN (Registered Nurse) on 11/22/24 at 1003  Med List Status: <None>   Medication Order Taking? Sig Documenting Provider Last Dose Status Informant  amLODipine  (NORVASC ) 2.5 MG tablet [id number] Yes Take 1 tablet (2.5 mg total) by mouth daily. Jordan, [name], MD  Active   aspirin  EC 81 MG tablet [id number]  Take 1 tablet (81 mg total) by mouth daily. Swallow whole.  Patient not taking: Reported on 11/06/2024   Whitfield [name], NP  Active Self, Spouse/Significant Other, Pharmacy Records, Multiple Informants  atorvastatin  (LIPITOR ) 40 MG tablet [id number] Yes Take 1 tablet (40 mg total) by mouth daily. Goodrich, Callie E, PA-C  Active   docusate sodium  (COLACE) 100 MG capsule [id number] Yes Take 100 mg by mouth at bedtime. [provider]  Active Self, Spouse/Significant Other, Pharmacy Records, Multiple Informants  DULoxetine  (CYMBALTA ) 60 MG capsule [id number] Yes  TAKE 1 CAPSULE BY MOUTH EVERY DAY Jordan, [name], MD  Active Self, Spouse/Significant Other, Pharmacy Records, Multiple Informants   esomeprazole  (NEXIUM ) 40 MG capsule [id number] Yes TAKE 1 CAPSULE (40 MG TOTAL) BY MOUTH 2 (TWO) TIMES DAILY BEFORE A MEAL. Craig Alan SAUNDERS, PA-C  Active Self, Spouse/Significant Other, Pharmacy Records, Multiple Informants  Melatonin 10 MG TABS [id number] Yes Take 20 mg by mouth at bedtime as needed (for sleeo). [provider]  Active Self, Spouse/Significant Other, Pharmacy Records, Multiple Informants  Multiple Vitamin (MULTIVITAMIN WITH MINERALS) TABS tablet [id number] Yes Take 1 tablet by mouth daily. [provider]  Active Self, Spouse/Significant Other, Pharmacy Records, Multiple Informants  olmesartan  (BENICAR ) 20 MG tablet [id number] Yes TAKE 1 TABLET BY MOUTH EVERY DAY Jordan, [name], MD  Active Self, Spouse/Significant Other, Pharmacy Records, Multiple Informants  Oxycodone  HCl 10 MG TABS [id number] Yes Take 1 tablet (10 mg total) by mouth 2 (two) times daily. Jordan, [name], MD  Active   traZODone  (DESYREL ) 50 MG tablet [id number] Yes TAKE 1 TABLET BY MOUTH EVERYDAY AT BEDTIME Jordan, [name], MD  Active Self, Spouse/Significant Other, Pharmacy Records, Multiple Informants            Goals Addressed             This Visit's Progress    COMPLETED: [facility] Transitions of Care (TOC) Care Plan       Problems:  Recent Hospitalization for treatment of subdural hematoma embolization12/16/2025  Doing much better,  speech and ambulation is better..   No new issues.  No taking Keppra .   11/06/2024  Wife and patient report that patient is doing well. Walking at the park this week. Eating and drinking well.  BP doing well, no falls. No new concerns today. Looking forward to going to [place] .  Saw cardiology and plan for holter monitor. 11/14/2024  Continues to heal.  Walking in the park daily.  Currently not using a walker.   Eating and drinking well. 11/22/2024- wife and patient reports that patient is doing great. Feels good, no new concerns today. Walking with cane.  No  Hospital Follow Up Provider appointment wife to call and schedule  10/30/2024 Completed TOC visit with PCP.  Referral placed for cardiology- Office visit scheduled for 10/30/2024- completed this visit.  11/14/2024 Currently wearing holter monitor. 11/22/2024  Holter monitor return and pending results.   Goal:  Over the next 30 days, the patient will not experience hospital readmission  Interventions:  Transitions of Care: Doctor Visits  - discussed the importance of doctor visits Reviewed use of walker and cane Confirmed medications Reviewed no pain. No recent falls.  Reviewed completion of TOC program and offered CCM and wife declines. Encouraged patient and wife to notify PCP if patient changes mind.     Subdural hematoma Reviewed Importance of taking all medications as prescribed Reviewed Importance of attending all scheduled provider appointments Advised to report any changes in symptoms or exercise tolerance Assessed for fall status and safety in the home Reviewed signs of stroke-  and when to call 911 Reviewed progress and patient is improving.    Patient Self Care Activities:  Attend all scheduled provider appointments Call pharmacy for medication refills 3-7 days in advance of running out of medications Call provider office for new concerns or questions  Take medications as prescribed   Call 911 for changes in mental status Use walker/ cane to prevent falls.   Plan:  Patient has completed TOC program and  declines further needs. Patient and or wife to call PCP for any concerns.        Recommendation:   Continue Current Plan of Care  Follow Up Plan:   Closing From:  Transitions of Care Program  Alan Ee, RN, BSN, [employer] [facility]- Transition of Care Team.  [facility] [phone]     "

## 2024-11-26 ENCOUNTER — Ambulatory Visit (HOSPITAL_BASED_OUTPATIENT_CLINIC_OR_DEPARTMENT_OTHER)
Admission: RE | Admit: 2024-11-26 | Discharge: 2024-11-26 | Disposition: A | Discharge status: Home / Self Care | Source: Ambulatory Visit | Attending: Neurosurgery | Admitting: Neurosurgery

## 2024-11-26 DIAGNOSIS — I6203 Nontraumatic chronic subdural hemorrhage: Secondary | ICD-10-CM | POA: Diagnosis present

## 2024-11-30 ENCOUNTER — Ambulatory Visit: Admitting: Physician Assistant

## 2024-11-30 ENCOUNTER — Ambulatory Visit
Admission: RE | Admit: 2024-11-30 | Discharge: 2024-11-30 | Disposition: A | Discharge status: Home / Self Care | Source: Ambulatory Visit | Attending: Physician Assistant | Admitting: Physician Assistant

## 2024-11-30 ENCOUNTER — Encounter (HOSPITAL_COMMUNITY): Payer: Self-pay | Admitting: Radiology

## 2024-11-30 VITALS — BP 96/58 | HR 66 | Ht 74.0 in | Wt 246.2 lb

## 2024-11-30 DIAGNOSIS — R1312 Dysphagia, oropharyngeal phase: Secondary | ICD-10-CM | POA: Diagnosis not present

## 2024-11-30 DIAGNOSIS — K5900 Constipation, unspecified: Secondary | ICD-10-CM | POA: Diagnosis not present

## 2024-11-30 DIAGNOSIS — T17500A Unspecified foreign body in bronchus causing asphyxiation, initial encounter: Secondary | ICD-10-CM

## 2024-11-30 DIAGNOSIS — Z860101 Personal history of adenomatous and serrated colon polyps: Secondary | ICD-10-CM

## 2024-11-30 DIAGNOSIS — R0989 Other specified symptoms and signs involving the circulatory and respiratory systems: Secondary | ICD-10-CM | POA: Diagnosis not present

## 2024-11-30 DIAGNOSIS — Z8673 Personal history of transient ischemic attack (TIA), and cerebral infarction without residual deficits: Secondary | ICD-10-CM | POA: Diagnosis not present

## 2024-11-30 DIAGNOSIS — R6881 Early satiety: Secondary | ICD-10-CM

## 2024-11-30 DIAGNOSIS — I6203 Nontraumatic chronic subdural hemorrhage: Secondary | ICD-10-CM

## 2024-11-30 DIAGNOSIS — K219 Gastro-esophageal reflux disease without esophagitis: Secondary | ICD-10-CM

## 2024-11-30 DIAGNOSIS — K2270 Barrett's esophagus without dysplasia: Secondary | ICD-10-CM

## 2024-11-30 DIAGNOSIS — I69391 Dysphagia following cerebral infarction: Secondary | ICD-10-CM

## 2024-11-30 DIAGNOSIS — G894 Chronic pain syndrome: Secondary | ICD-10-CM

## 2024-11-30 DIAGNOSIS — I69354 Hemiplegia and hemiparesis following cerebral infarction affecting left non-dominant side: Secondary | ICD-10-CM

## 2024-11-30 MED ORDER — ESOMEPRAZOLE MAGNESIUM 40 MG PO CPDR: 40.0000 mg | DELAYED_RELEASE_CAPSULE | Freq: Two times a day (BID) | ORAL | 3 refills | Status: AC

## 2024-11-30 NOTE — Patient Instructions (Addendum)
 Your provider has requested that you have an abdominal x ray before leaving today. Please go to the basement floor to our Radiology department for the test.  What is Barretts Esophagus? Barretts esophagus is a condition in which the lining of the esophagus (the tube that carries food from your mouth to your stomach) changes due to long-term acid reflux (also known as GERD - gastroesophageal reflux disease). Instead of its normal lining, the esophagus develops tissue similar to the lining of the intestine. This condition increases the risk of developing esophageal adenocarcinoma, a rare but serious form of cancer. However, most people with Barretts esophagus do not develop cancer.  Causes and Risk Factors Chronic GERD (acid reflux) - the main cause Male gender Age over 10 Caucasian race Smoking Obesity, especially belly fat Family history of Barretts esophagus or esophageal cancer  Symptoms Barretts esophagus itself doesnt usually cause symptoms. Most symptoms are related to GERD: Heartburn Acid regurgitation (sour or bitter liquid backing up into your throat) Difficulty swallowing Chest pain (not related to heart disease) Note: Some people with Barretts esophagus may not have noticeable reflux symptoms.  Barretts esophagus is diagnosed using: Upper endoscopy (EGD): A thin, flexible tube with a camera is passed down your throat. Biopsy: Small tissue samples are taken from the esophagus lining and checked under a microscope.  Treatment Options There is no cure for Barretts esophagus, but the goal is to manage GERD and prevent further changes or progression. Medications Proton pump inhibitors (PPIs): Reduce stomach acid (e.g., omeprazole , esomeprazole ) H2 blockers and antacids for milder cases  TAKE THE NEXIUM  TWICE A DAY  Lifestyle Changes Eat smaller meals Avoid trigger foods (spicy, fatty, acidic foods; caffeine; alcohol) Dont lie down after eating Elevate the head of  the bed Lose weight if overweight Stop smoking  Surveillance (Monitoring) Routine endoscopy every 3-5 years (or more often if precancerous changes are found)  Treatment for Advanced Cases If precancerous or cancerous cells are found, options may include: Endoscopic therapy (like radiofrequency ablation or endoscopic mucosal resection) Surgery to remove part of the esophagus in rare cases  Living with Barretts Esophagus Take medications as prescribed Follow up with regular endoscopies Maintain healthy lifestyle habits Be alert for new or worsening symptoms, such as trouble swallowing or unexplained weight loss  When to Call Your Doctor Difficulty swallowing Chest pain not related to the heart Unexplained weight loss Vomiting blood or black stools  Remember: While Barretts esophagus increases the risk of cancer, careful monitoring and treatment greatly reduce this risk. One potential complication of Barrett's esophagus is that, over time, the abnormal esophageal lining can develop early precancerous changes. The early changes may progress to advanced precancerous changes, and finally to frank esophageal cancer. If undetected, this cancer can spread and invade surrounding tissues. However, progression to cancer is uncommon for any individual patient; studies that follow patients with Barrett's esophagus reveal that fewer than 0.5 percent of patients develop esophageal cancer per year. Furthermore, patients with Barrett's esophagus appear to live approximately as long as people who are free of this condition.   Gastroparesis Gastroparesis is a condition in which food takes longer than normal to empty from the stomach.  This condition is also known as delayed gastric emptying. It is usually a long-term (chronic) condition.  What are the signs or symptoms? Symptoms of this condition include: Feeling full after eating very little or a loss of appetite. Nausea, vomiting, or  heartburn. Bloating of your abdomen. Inconsistent blood sugar (glucose) levels on  blood tests. Unexplained weight loss. Acid from the stomach coming up into the esophagus (gastroesophageal reflux). Sudden tightening (spasm) of the stomach, which can be painful. Symptoms may come and go. Some people may not notice any symptoms.  What increases the risk? You are more likely to develop this condition if: You have certain disorders or diseases. These may include: An endocrine disorder. An eating disorder. Amyloidosis. Scleroderma. Parkinson's disease. Multiple sclerosis. Cancer or infection of the stomach or the vagus nerve. You have had surgery on your stomach or vagus nerve. You take certain medicines. You are male.  Things you can do: Please do small frequent meals like 4-6 meals a day.  Eat and drink liquids at separate times.  Avoid high fiber foods, cook your vegetables, avoid high fat food.  Suggest spreading protein throughout the day (greek yogurt, glucerna, soft meat, milk, eggs) Choose soft foods that you can mash with a fork When you are more symptomatic, change to pureed foods foods and liquids.  Consider reading Living well with Gastroparesis by [facility] Check out this link to a diet online [email or web address].groupjournal.fr

## 2024-11-30 NOTE — Progress Notes (Signed)
 I reviewed his case. Difficult situation - with his history of Barrett' and dysphagia, he is overdue for EGD surveillance (albeit a low risk lesion), that would normally be next step. However I understand he has significant cormorbidities with recent subdural and history of CVAs and high risk for anesthesia, so agree with holding off on that for now. Very difficult situation. Sounds very much so c/w oropharyngeal dysphagia, I think speech path would be useful if he were willing to see them. He aspirated on a prior barium study attempt so would avoid that as well. Agree with maximizing PPI dosing for now.

## 2024-11-30 NOTE — Progress Notes (Signed)
 "    11/30/2024 Richard Vaughn [id number] Jul 26, 1951  Referring provider: Jordan, [name], MD Primary GI doctor: Dr. Leigh  ASSESSMENT AND PLAN:  GERD with history of Barrett's esophagus no dysplasia, oropharyngeal dysphagia, gastroparesis Gastric emptying study 02/19/2009 - delayed gastric emptying  EGD 2020 with Barrett's esophagus, no dysplasia, negative H. pylori, recall 3 to 5 years.  11/02/2022 barium swallow aborted due to aspiration 12/01/2022 MBS laryngeal penetration aspiration of thin barium 05/07/2024 MBS during hospital visit, no change in diet, does better with thick liquids but he does not want that On oxycodone  10 mg daily, nexium  once a day  Symptoms still sound much more oropharyngeal patient denies any esophageal dysphagia, denies any reflux.  Will still increase Nexium  40 mg twice daily for prevention of Barrett's and treatment, given gastroparesis diet especially with oxycodone  use, suggested referral back to speech therapy but patient declines, discussed recall endoscopy but this was a low risk PE segment without dysplasia and patient is very high risk with recurrent strokes most recently subdural hematoma status post embolization in December, no plan for endoscopic evaluation at this time.  Patient reassuringly had unremarkable esophagus on CT angio in June.  PLAN - increase nexium  40 mg TWICE a day - declines referral back to speech therapy but strongly suggest he return - check CXR with right lower lobe abnormality rule out aspiration pneumonia versus atelectasis, no fever, chills - Follow-up 6 months ideally with Dr. Leigh  Constipation secondary medications/immobility Continue Colace, consider reinitiation of MiraLAX  versus Amitiza/Movantik - on stool softeners and working well - Increase fiber/ water intake, decrease caffeine, increase activity level.   Gastroparesis Does not appear to be an issue at this time, gastroparesis diet given.  History of  hemorrhagic CVA with Left-sided hemiparesis Patient follows with Dr. 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  Pathology showed adenomatous.  Recall 5 years.   CAD/PAD Last seen 10/31/2024, for tachycardia Pending Zio patch and repeat echocardiogram (January 26) 08/2020 echo EF 60 to 65% no AAS   Patient Care Team: Jordan, [name], MD as PCP - General (Family Medicine)  HISTORY OF PRESENT ILLNESS: 74 y.o. male with a past medical history listed below presents for evaluation of dysphagia.   Patient last seen in the office March 2024 by Dr. Leigh for oropharyngeal dysphagia short segment Barrett's.  Discussed the use of AI scribe software for clinical note transcription with the patient, who gave verbal consent to proceed.  History of Present Illness   Richard Vaughn is a 74 year old male with Barrett's esophagus, oropharyngeal dysphagia with aspiration risk, and gastroparesis who presents for follow-up of swallowing difficulties.  Barrett's esophagus was diagnosed on endoscopy in 2020 without evidence of dysplasia or malignancy. No repeat endoscopy has been performed since. In June 2025, he was hospitalized for pneumonia, cough, and fatigue, and a modified barium swallow confirmed aspiration. A prior barium swallow was aborted due to aspiration. During this hospitalization, speech therapy evaluation led to provision of a swallowing device, which he uses intermittently. CT angiography and CT of the esophagus in June 2025 showed no mass or thickening.  He experiences intermittent difficulty swallowing, localized to the upper throat.  There  is no sensation of food or pills getting stuck in the lower esophagus, and no sensation of food or liquid going down the wrong pipe. He does not experience heartburn, reflux, acid regurgitation, hoarseness, throat discomfort, or pain. He denies throat clearing, cough, shortness of breath, a lump in the throat, excessive drainage, esophageal spasm, or palpitations after eating or lying down. He prefers soft foods and thick textures, does not tolerate thickeners, and does not follow a modified dysphagia diet. Tea, cheer wine, and water are tolerated without difficulty. Medications are taken with pudding at night, which is effective. Occasional burping or belching occurs, but not excessively.  Current medications include Nexium  (esomeprazole ) 40 mg once daily. Miralax  is used for bowel regularity, with bowel movements described as the best he has been in a while. Chronic oxycodone  is taken for pain. He is able to take short walks but experiences significant tachycardia and shortness of breath with exertion, and is awaiting results of a Holter monitor and echocardiogram.  No fevers or chills have occurred. He is managed at home with caregiver support for dietary management and medication administration.  History includes a large hemorrhagic stroke with subdural hematoma in December 2025, treated with embolization, with good recovery reported.        He  reports that he quit smoking about 6 years ago. His smoking use included cigarettes. He started smoking about 36 years ago. He has a 15 pack-year smoking history. He has never used smokeless tobacco. He reports that he does not drink alcohol and does not use drugs.  RELEVANT GI HISTORY, IMAGING AND LABS: Results   Radiology CT esophagus (04/2024): Normal esophagus without mass or wall thickening  Diagnostic Upper endoscopy (2020): Barrett's esophagus without dysplasia or malignancy Barium swallow: Aborted due to aspiration Modified barium swallow  (04/2024): Aspiration observed Gastric emptying study (2010): Delayed gastric emptying      CBC    Component Value Date/Time   WBC 7.4 10/16/2024 1718   RBC 5.01 10/16/2024 1718   HGB 14.8 10/16/2024 1718   HGB 13.5 07/13/2022 1320   HCT 44.3 10/16/2024 1718   HCT 40.4 07/13/2022 1320   PLT 207 10/16/2024 1718   PLT 190 07/13/2022 1320   MCV 88.4 10/16/2024 1718   MCV 92 07/13/2022 1320   MCH 29.5 10/16/2024 1718   MCHC 33.4 10/16/2024 1718   RDW 13.0 10/16/2024 1718   RDW 13.0 07/13/2022 1320   LYMPHSABS 1.9 10/16/2024 1718   LYMPHSABS 1.6 07/13/2022 1320   MONOABS 0.7 10/16/2024 1718   EOSABS 0.1 10/16/2024 1718   EOSABS 0.1 07/13/2022 1320   BASOSABS 0.0 10/16/2024 1718   BASOSABS 0.0 07/13/2022 1320   Recent Labs    05/06/24 1254 05/07/24 0424 08/07/24 1004 08/15/24 1620 08/16/24 0330 08/17/24 0123 08/17/24 1739 09/26/24 1427 09/30/24 0801 10/16/24 1718  HGB 14.2 14.1 15.8 14.2 14.2 13.7 14.1 14.7 13.7 14.8    CMP     Component Value Date/Time   NA 139 10/16/2024 1718   NA 141 07/13/2022 1320   K 3.7 10/16/2024 1718   CL 103 10/16/2024 1718   CO2 25 10/16/2024 1718   GLUCOSE 92 10/16/2024 1718   GLUCOSE 105 (H) 11/01/2006 1056   BUN 10 10/16/2024 1718   BUN 10 07/13/2022 1320   CREATININE 1.01 10/16/2024 1718   CREATININE 0.99 01/29/2022 1516   CALCIUM  9.2 10/16/2024 1718   PROT 7.3 10/16/2024 1718   PROT 7.0 07/13/2022 1320   ALBUMIN 4.2 10/16/2024 1718  ALBUMIN 4.4 07/13/2022 1320   AST 15 10/16/2024 1718   ALT 15 10/16/2024 1718   ALKPHOS 130 (H) 10/16/2024 1718   BILITOT 0.5 10/16/2024 1718   BILITOT 0.4 07/13/2022 1320   GFRNONAA >60 10/16/2024 1718   GFRAA >60 08/07/2020 1045      Latest Ref Rng & Units 10/16/2024    5:18 PM 09/30/2024    8:01 AM 08/15/2024    4:20 PM  Hepatic Function  Total Protein 6.5 - 8.1 g/dL 7.3  6.2  7.0   Albumin 3.5 - 5.0 g/dL 4.2  3.1  3.8   AST 15 - 41 U/L 15  13  19    ALT 0 - 44 U/L 15  15  21     Alk Phosphatase 38 - 126 U/L 130  92  210   Total Bilirubin 0.0 - 1.2 mg/dL 0.5  0.7  0.3       Current Medications:   Current Outpatient Medications (Cardiovascular):    amLODipine  (NORVASC ) 2.5 MG tablet, Take 1 tablet (2.5 mg total) by mouth daily.   atorvastatin  (LIPITOR ) 40 MG tablet, Take 1 tablet (40 mg total) by mouth daily.   olmesartan  (BENICAR ) 20 MG tablet, TAKE 1 TABLET BY MOUTH EVERY DAY  Current Outpatient Medications (Analgesics):    Oxycodone  HCl 10 MG TABS, Take 1 tablet (10 mg total) by mouth 2 (two) times daily.   [Paused] aspirin  EC 81 MG tablet, Take 1 tablet (81 mg total) by mouth daily. Swallow whole. (Patient not taking: Reported on 11/30/2024)  Current Outpatient Medications (Other):    docusate sodium  (COLACE) 100 MG capsule, Take 100 mg by mouth at bedtime.   DULoxetine  (CYMBALTA ) 60 MG capsule, TAKE 1 CAPSULE BY MOUTH EVERY DAY   Melatonin 10 MG TABS, Take 20 mg by mouth at bedtime as needed (for sleeo).   Multiple Vitamin (MULTIVITAMIN WITH MINERALS) TABS tablet, Take 1 tablet by mouth daily.   traZODone  (DESYREL ) 50 MG tablet, TAKE 1 TABLET BY MOUTH EVERYDAY AT BEDTIME   esomeprazole  (NEXIUM ) 40 MG capsule, Take 1 capsule (40 mg total) by mouth 2 (two) times daily before a meal.  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) 09/23/2015   Chronic back pain    CVA (cerebral vascular accident) (HCC) 09/22/2016   Depression    Gastritis    Mild   Gastroparesis    GERD (gastroesophageal reflux disease)    Headache    History of kidney stones    Hyperglycemia    Hypertension    ICH (intracerebral hemorrhage) (HCC) - R thalmic/PLIC d/t HTN 93/98/7980   Lacunar infarct, acute (HCC) 08/25/2015   Nephrolithiasis    Pneumonia    Covid pneumonia   Renal cyst    Sleep apnea    Stricture and stenosis of esophagus    Stroke (HCC) 07/2015    Stroke-like symptom 09/22/2016   Treatment-emergent central sleep apnea 10/25/2018   Vision abnormalities    Allergies: Allergies[1]   Surgical History:  He  has a past surgical history that includes Lumbar disc surgery (2005, 2010); Cervical disc surgery (2012); Upper gastrointestinal endoscopy; Colonoscopy; Shoulder arthroscopy with rotator cuff repair (Left, 08/14/2020); Radiology with anesthesia (N/A, 10/18/2024); IR Transcath/Emboliz (10/18/2024); IR ANGIO EXTRACRAN SEL COM CAROTID INNOMINATE UNI L MOD SED (10/18/2024); IR ANGIO EXTERNAL CAROTID SEL EXT CAROTID UNI L MOD SED (10/18/2024); and IR NEURO EACH ADD'L  AFTER BASIC UNI LEFT (MS) (10/18/2024). Family History:  His family history includes Hypertension in his brother, father, mother, and sister; Liver cancer in his mother; Stroke in his father, sister, and sister.  REVIEW OF SYSTEMS  : All other systems reviewed and negative except where noted in the History of Present Illness.  PHYSICAL EXAM: BP (!) 96/58   Pulse 66   Ht 6' 2 (1.88 m)   Wt 246 lb 4 oz (111.7 kg)   BMI 31.62 kg/m  Physical Exam   GENERAL APPEARANCE:  in no apparent distress. HEENT: No cervical lymphadenopathy, unremarkable thyroid , sclerae anicteric, conjunctiva pink. RESPIRATORY: Respiratory effort normal, abnormal sounds on the right lower lung. CARDIO: Regular rate and rhythm with no murmurs, rubs, or gallops, peripheral pulses intact. ABDOMEN: Soft, non-distended, active bowel sounds in all four quadrants, no tenderness to palpation, no rebound, no mass appreciated. RECTAL: Declines. MUSCULOSKELETAL: Left-sided weakness, currently in wheelchair gait not tested SKIN: Dry, intact without rashes or lesions. No jaundice. NEURO: Alert, oriented, left-sided hemiparesis, dysarthria no aphasia PSYCH: Cooperative, normal mood and affect.      Alan JONELLE Coombs, PA-C 3:30 PM      [1] No Known Allergies  "

## 2024-12-02 DIAGNOSIS — R002 Palpitations: Secondary | ICD-10-CM

## 2024-12-03 ENCOUNTER — Ambulatory Visit: Payer: Self-pay | Admitting: Physician Assistant

## 2024-12-03 ENCOUNTER — Ambulatory Visit: Payer: Self-pay | Admitting: Student

## 2024-12-05 MED ORDER — METOPROLOL SUCCINATE ER 25 MG PO TB24: 25.0000 mg | ORAL_TABLET | Freq: Every day | ORAL | 2 refills | Status: AC

## 2024-12-05 NOTE — Progress Notes (Signed)
 Will send message to Katie (Dr. Fanny nurse) to call pt for appt

## 2024-12-07 NOTE — Telephone Encounter (Signed)
 Scheduled Consult for Loop Recorder- 12/31/24 with Dr Francyne

## 2024-12-07 NOTE — Progress Notes (Signed)
 Thank you Katie!

## 2024-12-10 ENCOUNTER — Ambulatory Visit (HOSPITAL_COMMUNITY)

## 2024-12-20 ENCOUNTER — Telehealth: Payer: Self-pay

## 2024-12-20 NOTE — Telephone Encounter (Signed)
 Copied from CRM #[id number]. Topic: Clinical - Medication Refill >> Dec 20, 2024  3:52 PM Burnard DEL wrote: Medication: Oxycodone  HCl 10 MG TABS  Has the patient contacted their pharmacy? No (Agent: If no, request that the patient contact the pharmacy for the refill. If patient does not wish to contact the pharmacy document the reason why and proceed with request.) (Agent: If yes, when and what did the pharmacy advise?)  This is the patient's preferred pharmacy:    [id number] - [place],  - [address] AT [facility] [address] [place] [address] Phone: [phone] Fax: [phone]    Is this the correct pharmacy for this prescription? Yes If no, delete pharmacy and type the correct one.   Has the prescription been filled recently? No  Is the patient out of the medication? No(2 pills left)  Has the patient been seen for an appointment in the last year OR does the patient have an upcoming appointment? Yes  Can we respond through MyChart? Yes  Agent: Please be advised that Rx refills may take up to 3 business days. We ask that you follow-up with your pharmacy.

## 2024-12-21 ENCOUNTER — Other Ambulatory Visit: Payer: Self-pay | Admitting: Family Medicine

## 2024-12-21 DIAGNOSIS — G894 Chronic pain syndrome: Secondary | ICD-10-CM

## 2024-12-21 DIAGNOSIS — G8929 Other chronic pain: Secondary | ICD-10-CM

## 2024-12-21 MED ORDER — OXYCODONE HCL 10 MG PO TABS: 10.0000 mg | ORAL_TABLET | Freq: Two times a day (BID) | ORAL | 0 refills | Status: AC

## 2024-12-21 NOTE — Telephone Encounter (Signed)
Rx sent. Thanks, BJ 

## 2024-12-31 ENCOUNTER — Ambulatory Visit: Admitting: Cardiovascular Disease

## 2025-01-02 ENCOUNTER — Ambulatory Visit: Admitting: Podiatry

## 2025-01-04 ENCOUNTER — Ambulatory Visit (HOSPITAL_COMMUNITY)

## 2025-01-22 ENCOUNTER — Ambulatory Visit: Admitting: Family Medicine

## 2025-01-28 ENCOUNTER — Ambulatory Visit: Admitting: Student

## 2025-04-15 ENCOUNTER — Ambulatory Visit: Admitting: Adult Health

## 2025-05-03 ENCOUNTER — Ambulatory Visit
